# Patient Record
Sex: Male | Born: 1935
Health system: Southern US, Community
[De-identification: ages and names within clinical notes are randomized; demographics above are authoritative.]

## PROBLEM LIST (undated history)

## (undated) DIAGNOSIS — I2699 Other pulmonary embolism without acute cor pulmonale: Secondary | ICD-10-CM

## (undated) DIAGNOSIS — I509 Heart failure, unspecified: Secondary | ICD-10-CM

## (undated) DIAGNOSIS — I809 Phlebitis and thrombophlebitis of unspecified site: Secondary | ICD-10-CM

## (undated) DIAGNOSIS — R066 Hiccough: Secondary | ICD-10-CM

## (undated) DIAGNOSIS — K219 Gastro-esophageal reflux disease without esophagitis: Secondary | ICD-10-CM

## (undated) DIAGNOSIS — L739 Follicular disorder, unspecified: Secondary | ICD-10-CM

## (undated) DIAGNOSIS — I272 Pulmonary hypertension, unspecified: Secondary | ICD-10-CM

## (undated) DIAGNOSIS — G4730 Sleep apnea, unspecified: Secondary | ICD-10-CM

## (undated) DIAGNOSIS — I10 Essential (primary) hypertension: Secondary | ICD-10-CM

## (undated) DIAGNOSIS — I959 Hypotension, unspecified: Secondary | ICD-10-CM

## (undated) DIAGNOSIS — M1990 Unspecified osteoarthritis, unspecified site: Secondary | ICD-10-CM

## (undated) DIAGNOSIS — J449 Chronic obstructive pulmonary disease, unspecified: Secondary | ICD-10-CM

## (undated) DIAGNOSIS — N400 Enlarged prostate without lower urinary tract symptoms: Secondary | ICD-10-CM

## (undated) DIAGNOSIS — M109 Gout, unspecified: Secondary | ICD-10-CM

## (undated) DIAGNOSIS — J189 Pneumonia, unspecified organism: Secondary | ICD-10-CM

## (undated) HISTORY — PX: CHOLECYSTECTOMY: SHX55

## (undated) HISTORY — PX: OTHER SURGICAL HISTORY: SHX169

## (undated) HISTORY — DX: Heart failure, unspecified: I50.9

---

## 2001-01-08 ENCOUNTER — Ambulatory Visit (HOSPITAL_COMMUNITY): Admission: RE | Admit: 2001-01-08 | Discharge: 2001-01-08 | Payer: Self-pay | Admitting: *Deleted

## 2001-01-08 ENCOUNTER — Encounter (INDEPENDENT_AMBULATORY_CARE_PROVIDER_SITE_OTHER): Payer: Self-pay | Admitting: Specialist

## 2001-01-12 ENCOUNTER — Encounter: Payer: Self-pay | Admitting: Internal Medicine

## 2001-01-12 ENCOUNTER — Ambulatory Visit (HOSPITAL_COMMUNITY): Admission: RE | Admit: 2001-01-12 | Discharge: 2001-01-12 | Payer: Self-pay | Admitting: Internal Medicine

## 2003-04-09 ENCOUNTER — Encounter: Payer: Self-pay | Admitting: Internal Medicine

## 2003-04-09 ENCOUNTER — Encounter: Admission: RE | Admit: 2003-04-09 | Discharge: 2003-04-09 | Payer: Self-pay | Admitting: Internal Medicine

## 2005-08-16 ENCOUNTER — Emergency Department (HOSPITAL_COMMUNITY): Admission: EM | Admit: 2005-08-16 | Discharge: 2005-08-17 | Payer: Self-pay | Admitting: Emergency Medicine

## 2005-12-15 ENCOUNTER — Emergency Department (HOSPITAL_COMMUNITY): Admission: EM | Admit: 2005-12-15 | Discharge: 2005-12-15 | Payer: Self-pay | Admitting: Emergency Medicine

## 2005-12-17 ENCOUNTER — Ambulatory Visit (HOSPITAL_COMMUNITY): Admission: RE | Admit: 2005-12-17 | Discharge: 2005-12-17 | Payer: Self-pay | Admitting: Emergency Medicine

## 2005-12-18 ENCOUNTER — Encounter: Admission: RE | Admit: 2005-12-18 | Discharge: 2005-12-18 | Payer: Self-pay | Admitting: Surgery

## 2005-12-19 ENCOUNTER — Inpatient Hospital Stay (HOSPITAL_COMMUNITY): Admission: EM | Admit: 2005-12-19 | Discharge: 2005-12-24 | Payer: Self-pay | Admitting: Emergency Medicine

## 2005-12-20 ENCOUNTER — Encounter (INDEPENDENT_AMBULATORY_CARE_PROVIDER_SITE_OTHER): Payer: Self-pay | Admitting: Specialist

## 2005-12-21 ENCOUNTER — Ambulatory Visit: Payer: Self-pay | Admitting: Infectious Diseases

## 2005-12-27 ENCOUNTER — Emergency Department (HOSPITAL_COMMUNITY): Admission: EM | Admit: 2005-12-27 | Discharge: 2005-12-27 | Payer: Self-pay | Admitting: Emergency Medicine

## 2006-05-28 ENCOUNTER — Encounter (INDEPENDENT_AMBULATORY_CARE_PROVIDER_SITE_OTHER): Payer: Self-pay | Admitting: *Deleted

## 2006-05-29 ENCOUNTER — Inpatient Hospital Stay (HOSPITAL_COMMUNITY): Admission: RE | Admit: 2006-05-29 | Discharge: 2006-05-30 | Payer: Self-pay | Admitting: Surgery

## 2006-06-04 ENCOUNTER — Ambulatory Visit: Payer: Self-pay | Admitting: Gastroenterology

## 2007-05-15 ENCOUNTER — Encounter: Admission: RE | Admit: 2007-05-15 | Discharge: 2007-05-15 | Payer: Self-pay | Admitting: Internal Medicine

## 2007-06-10 ENCOUNTER — Ambulatory Visit (HOSPITAL_COMMUNITY): Admission: RE | Admit: 2007-06-10 | Discharge: 2007-06-10 | Payer: Self-pay | Admitting: *Deleted

## 2007-10-30 ENCOUNTER — Encounter: Admission: RE | Admit: 2007-10-30 | Discharge: 2007-10-30 | Payer: Self-pay | Admitting: Internal Medicine

## 2008-11-24 ENCOUNTER — Emergency Department (HOSPITAL_COMMUNITY): Admission: EM | Admit: 2008-11-24 | Discharge: 2008-11-24 | Payer: Self-pay | Admitting: Emergency Medicine

## 2010-09-09 ENCOUNTER — Encounter: Payer: Self-pay | Admitting: Internal Medicine

## 2010-11-28 LAB — GLUCOSE, CAPILLARY: Glucose-Capillary: 123 mg/dL — ABNORMAL HIGH (ref 70–99)

## 2011-01-01 NOTE — Op Note (Signed)
NAME:  Joshua [name], Joshua [name] NO.:  [id number]   MEDICAL RECORD NO.:  [id number]          PATIENT TYPE:  AMB   LOCATION:  ENDO                         FACILITY:  [facility]   PHYSICIAN:  Waverly Ferrari, M.D.    DATE OF BIRTH:  1935-11-07   DATE OF PROCEDURE:  06/10/2007  DATE OF DISCHARGE:                               OPERATIVE REPORT   PROCEDURE:  Colonoscopy and biopsy.   INDICATIONS:  Colon polyp and colon cancer screening.   ANESTHESIA:  Fentanyl 100 mcg, Versed 7 mg.   DESCRIPTION OF PROCEDURE:  With the patient mildly sedated in the left  lateral decubitus position, the Pentax videoscopic colonoscope was  inserted into the rectum after a rectal exam was performed which was  unremarkable.  It was passed under direct vision to the cecum identified  by the ileocecal valve and the appendiceal orifice, both which were  photographed. From this point, the colonoscope was slowly withdrawn  taking circumferential views of the colonic mucosa stopping to  photograph diverticula seen along the way, until we reached  approximately 20 cm from the anal verge at which point a polyp was seen,  photographed, and removed using hot biopsy forceps technique at a  setting of 20/150 blended current.  The rectum, otherwise, appeared  normal on direct and showed hemorrhoids on retroflexed view. The  endoscope was straightened and withdrawn.  The patient's vital signs and  pulse oximetry remained stable.  The patient tolerated the procedure  well without apparent complications.   FINDINGS:  Wide mouth diverticula seen in the right and left colon.  Internal hemorrhoids.  A polyp at 20 cm from the anal verge.  Await  biopsy report.  The patient will call me for results and follow up with  me as an outpatient.           ______________________________  Waverly Ferrari, M.D.     GMO/MEDQ  D:  06/10/2007  T:  06/10/2007  Job:  [id number]

## 2011-01-04 NOTE — Procedures (Signed)
[facility]  Patient:    Joshua [name], Joshua [name]                     MRN: [id number] Proc. Date: 01/08/01 Adm. Date:  [id number] Attending:  Jim Desanctis                           Procedure Report  PROCEDURE:  Colonoscopy.  INDICATION FOR PROCEDURE:  Rectal bleeding.  ANESTHESIA:  Demerol 10, Versed 2 mg.  DESCRIPTION OF PROCEDURE:  With the patient mildly sedated in the left lateral decubitus position, the Olympus videoscopic colonoscope was inserted in the rectum after a normal rectal exam and passed under direct vision to the cecum. identified by the ileocecal valve and appendiceal orifice both of which were photographed. From this point, the colonoscope was slowly withdrawn taking circumferential views of the entire colonic mucosa, stopping at 20 cm from the anal verge at which point a polyp was seen, photographed and removed using hot biopsy forceps technique on a setting of 20:20 blended current. The endoscope was then pulled back to the rectum which appeared normal on direct view and showed hemorrhoidal tissue and hypertrophy of the anal papilla on retroflexed view. The endoscope was then straightened, stomach decompressed and the endoscope pulled through the anal canal which showed hemorrhoids. The endoscope was withdrawn. The patients vital signs and pulse oximeter remained stable. The patient tolerated the procedure well without apparent complications.  FINDINGS:  Hemorrhoids and a polyp at 20 cm from the anal verge. Of note, the patient had significant right sided diverticulosis just above the cecum. DD:  01/08/01 TD:  01/08/01 Job: [id number]

## 2011-01-04 NOTE — Discharge Summary (Signed)
NAME:  KRISTOF, NADEEM              ACCOUNT NO.:  [id number]   MEDICAL RECORD NO.:  [id number]          PATIENT TYPE:  INP   LOCATION:  5730                         FACILITY:  [facility]   PHYSICIAN:  Mobolaji B. Bakare, M.D.DATE OF BIRTH:  May 25, 1936   DATE OF ADMISSION:  12/19/2005  DATE OF DISCHARGE:                                 DISCHARGE SUMMARY   PRIMARY CARE PHYSICIAN:  Theodoro Parma. Conley Canal, MD   DIAGNOSES:  1.  Liver abscess versus metastases.  2.  Cholestatic jaundice, improving.  3.  Thrombocytopenia, improved.  4.  Hypertension.  5.  Hyperkalemia.  6.  Tobacco abuse.   PROCEDURES:  1.  Ultrasound of the abdomen done on Dec 17, 2005, showed cholelithiasis      without evidence of acute cholecystitis and normal caliber of common      bile duct.  2.  CT scan of abdomen done on Dec 18, 2005, showed scattered, peripheral,      ill-defined areas of liver enhancement with central lobe attenuation      which were difficult to characterize, and an MRI was recommended, and      well-circumscribed, low-density lesion of the right hepatic lobe      consistent with cyst, cholelithiasis, left adrenal nodule, too small to      characterize, low-density lesions in the left kidneys.  3.  Pelvic CT scan showed prostate enlargement.  4.  MCRP showed cholelithiasis without biliary dilatation of      choledocholithiasis.  There were multiple enhancing liver lesions      compatible with metastasis or possible abscess.  No primary malignancy      identified on MRCP.  The left adrenal lesion that was noted on CT scan      appeared stable and benign.  5.  HIDA scan, hepatobiliary scan, done on Dec 20, 2005, was negative for      obstruction of the common bile duct.  6.  Chest x-ray showed bilateral basal atelectasis, mild diffuse      peribronchial pigment.  7.  Ultrasound-guided biopsy of liver lesion done on Dec 20, 2005, by Dr.      Vernard Gambles was sent for pathology and culture.    CONSULTATIONS:  1.  Surgical consultation, Imogene Burn. Georgette Dover, MD.  2.  Infectious disease consultation, Alison Murray, MD.   BRIEF HISTORY:  Mr. Wenzler is a pleasant 75 year old African American male  with history of hypertension.  He presented with approximately a 2-week  history of not feeling well, nausea, vomiting, and having hiccups.  He went  to [facility] Emergency Department where he was initially evaluated.  Ultrasound results were as noted above, and he followed up with Dr. Conley Canal  in the office who recommended further evaluation by Dr. Georgette Dover.  He had a CT  scan of the abdomen on Dec 18, 2005, and results are as mentioned above.  The  patient's symptoms continued, and he developed fever and leukocytosis with  elevated transaminases.  Hence, he was sent to [facility] for  hospitalization and further evaluation.   Problem 1.  Multiple liver  abscesses.  Mr. Schlabach presented with symptoms  and signs consistent with infection.  He had radiological imaging which  reported and favored metastatic disease over liver abscess.  On admission,  he was empirically started on antibiotics.  He denied any significant  chronic weight loss.  He has had colonoscopy 4 years ago which was benign.  Although he does smoke, CT abdomen and imaging did not show any focal  primary.  Chest x-ray was unremarkable.  The patient was screened with CEA,  alpha fetoprotein, and CA 19-9, all well within normal.  He underwent core  biopsy of the liver lesion.  Preliminary results of culture is growing gram-  negative rods.  Currently, fever is resolving.  Leukocytosis has improved  from 20,000 to 11.1.  Hiccups have subsided with baclofen.  Clinically, the  patient is responding to antibiotics, and the weight of evidence is in  support of an infection.  At this point, pathology report of core biopsy is  pending.  We will continue Zosyn until sensitivity pattern is available.  Cholestatic jaundice is improving.   The patient was evaluated by infectious  disease, Dr. Orene Desanctis.  He agreed with Zosyn.  The thought process is that the  patient probably had biliary infection with cholangitis and involvement of  intrahepatic biliary system.  He will eventually need to have cholecystitis.   Problem 2.  Thrombocytopenia.  On admission, platelets were noted to be 105.  This improved and normalized since antibiotics were started.  It was felt  that thrombocytopenia was secondary to infection.   Problem 3.  Hypokalemia.  This was replenished with potassium supplements.   Problem 4.  Hypertension.  This was controlled during the course of  hospitalization.  The patient stated he has a history of hypertension but is  not on any medications.  Blood pressure was normal during course of  hospitalization.   Problem 5.  Tobacco abuse.  The patient was counseled on quitting smoking,  and he was given a nicotine patch.   ADMISSION LABORATORY DATA:  Alpha fetoprotein 3.4, normal.  CEA less than  0.5, normal.  CA 19-9 0.3, low.  Blood culture no growth so far.  Urine  culture no growth.   ADDENDUM  An addendum to this dictation will be made at the time of discharge with  discharge medications and discharge laboratory data.      Mobolaji B. Maia Petties, M.D.  Electronically Signed     MBB/MEDQ  D:  12/22/2005  T:  12/22/2005  Job:  [id number]   cc:   Imogene Burn. Georgette Dover, M.D.  Marshall Marble. Francina Ames., M.D.  Fax: [phone]

## 2011-01-04 NOTE — Discharge Summary (Signed)
NAME:  DAXX, TIGGS NO.:  [id number]   MEDICAL RECORD NO.:  [id number]          PATIENT TYPE:  INP   LOCATION:  5730                         FACILITY:  [facility]   PHYSICIAN:  Jacquelynn Cree, M.D.   DATE OF BIRTH:  1936/04/14   DATE OF ADMISSION:  12/19/2005  DATE OF DISCHARGE:  12/24/2005                                 DISCHARGE SUMMARY   ADDENDUM:   PRIMARY CARE PHYSICIAN:  Theodoro Parma. Conley Canal, M.D.   GENERAL SURGEON:  Imogene Burn. Tsuei, M.D.   For complete list of the diagnoses, procedures and diagnostic studies,  consultations, brief history of present illness, and problem list with  hospital course, please see the previously dictated discharge summary done  by Dr. Maia Petties on Dec 19, 2005.   FINAL DIAGNOSES:  1.  Escherichia coli culture positive liver abscess status post biopsy.  2.  Cholestatic jaundice, improving.  3.  Thrombocytosis.  4.  Hypertension.  5.  Hypokalemia, resolved.  6.  Diarrhea.  7.  Gastroesophageal reflux disease.  8.  Tobacco abuse.  9.  Elevation of liver transaminases.   DISCHARGE MEDICATIONS:  1.  Protonix 40 mg p.o. daily.  2.  Doxazosin 2 mg daily.  3.  Rocephin 1 gram IV daily x3 weeks total (note, the patient is on day 6      out of 21).   Marvell:  The patient remained medically stable except  for complaints of diarrhea stools.  A stool was collected and sent for C-  difficile toxin analysis which is pending at the time of this dictation.  Additionally, the patient underwent placement of a peripherally inserted  central catheter for home IV therapy.  His diet was advanced which he  tolerated well.  The patient will continue to receive IV antibiotics at  home, to be  administered by the home health care nurses.  He is to follow up with Dr.  Georgette Dover for elective cholecystectomy after he completes his antibiotic  therapy.  He should follow up with his primary care physician, Dr. Conley Canal,  in one to  two weeks' time.  At this juncture, he is stable for discharge.           ______________________________  Jacquelynn Cree, M.D.     CR/MEDQ  D:  12/24/2005  T:  12/25/2005  Job:  [id number]   cc:   Theodoro Parma. Francina Ames., M.D.  Fax: [place]. Georgette Dover, M.D.  [address] [place]

## 2011-01-04 NOTE — Procedures (Signed)
[facility]  Patient:    [name], Joshua [name]                     MRN: [id number] Proc. Date: 01/08/01 Adm. Date:  [id number] Attending:  Jim Desanctis                           Procedure Report  PROCEDURE:  Upper endoscopy.  INDICATION FOR PROCEDURE:  Reflux symptomatology.  ANESTHESIA:  Demerol 50, Versed 5 mg.  DESCRIPTION OF PROCEDURE:  With the patient mildly sedated in the left lateral decubitus position, the Olympus videoscopic endoscope was inserted in the mouth and passed under direct vision through the esophagus which appeared normal into the stomach. The fundus, body, antrum, duodenal bulb, and second portion of the duodenal all appeared normal and were photographed. From this point, the endoscope was slowly withdrawn taking circumferential views of the entire duodenal mucosa until the endoscope was then pulled back into the stomach, placed in retroflexion to view the stomach from below and a hernia was seen and photographed. The endoscope was straightened and withdrawn taking circumferential views of the remaining gastric and esophageal mucosa which otherwise appeared normal.  The patients vital signs and pulse oximeter remained stable. The patient tolerated the procedure well and there were no apparent complications.  FINDINGS:  Changes of hiatal hernia otherwise unremarkable examination.  PLAN:  Proceed to colonoscopy. DD:  01/08/01 TD:  01/08/01 Job: [id number]

## 2011-01-04 NOTE — Consult Note (Signed)
NAME:  Joshua [name], Joshua [name] NO.:  [id number]   MEDICAL RECORD NO.:  [id number]          PATIENT TYPE:  INP   LOCATION:  5730                         FACILITY:  [place]   PHYSICIAN:  Imogene Burn. Georgette Dover, M.D. DATE OF BIRTH:  1936/02/02   DATE OF CONSULTATION:  DATE OF DISCHARGE:                                   CONSULTATION   PRIMARY CARE PHYSICIAN:  Dr. Harley Alto.   REASON FOR CONSULTATION:  Abnormal liver function tests and gallstones.   HISTORY OF PRESENT ILLNESS:  We are asked to reconsult on this patient.  I  saw him last this past Tuesday in our urgent office for gallstones.  At that  time, the patient was asymptomatic except for some mild nausea.  He had no  evidence of abdominal pain.  He was profoundly jaundiced and dehydrated.  I  spoke with Dr. Conley Canal who agreed to arrange further followup with his  gastroenterologist.  The patient underwent a CT scan which showed  gallstones, but no evidence of inflammation.  The patient continues to have  nausea, vomiting and has begun to have some right flank pain.  He continues  to be jaundiced.   The patient is a 75 year old male with a past medical history significant  for heavy alcohol use.  He also has a documented hiatal hernia.  Last  Saturday, April29th, the patient began having nausea, vomiting, and  diarrhea.  He was evaluated in the emergency department at [facility] by  Dr. Peter Minium where he underwent some blood work.  This showed a normal white  blood cell count, hemoglobin of 14.8, abnormal liver functions with a total  bilirubin of 6.4,  direct of 4.1, indirect 2.3, alkaline phosphatase 142,  AST 142, ALT 173.  Urinalysis confirmed large amounts of bilirubin.  The  patient is also profoundly hyponatremic with a sodium of 128 and potassium  of 2.8, creatinine 1.6.  The patient was discharged home and underwent as  outpatient ultrasound on Monday which showed cholelithiasis, but no evidence  cholecystitis.  He was then referred for surgical evaluation.  I do not feel  that his symptoms represented acute cholecystitis.  He had an obvious  biliary obstruction and needed a gastroenterologist to evaluate.  It is  unclear to me at this point whether the patient has been seen by his  gastroenterologist, Dr. Lajoyce Corners.   MEDICATIONS:  Doxazosin 2 mg p.o. daily, Protonix 40 mg p.o. daily, HCTZ 50  mg p.o. daily.   ALLERGIES:  SULFA.   PAST MEDICAL HISTORY:  Hiatal hernia, hypertension, alcohol abuse.   PAST SURGICAL HISTORY:  Excision of pilonidal cyst.   SOCIAL HISTORY:  The patient smokes 2 packs a day and drinks a fifth of  liquor a week.   FAMILY HISTORY:  Father is deceased from cancer of unclear etiology, and  mother is deceased from natural causes.   EXAMINATION:  VITAL SIGNS:  Height 64, weight 220, blood pressure is 110/83,  pulse is 90.  The patient is afebrile.  GENERAL:  This is a well-developed, well-nourished African-American male in  no apparent distress.  NEURO:  Awake, alert, oriented x3.  HEENT:  EOMI.  His sclerae show icterus.  NECK:  No masses.  No thyromegaly.  LUNGS:  Clear to auscultation bilaterally.  Normal respiratory effort.  HEART:  Regular rate and rhythm.  No murmurs.  ABDOMEN:  Positive bowel sounds, soft, nondistended.  Minimal tenderness in  the right upper quadrant around to the right flank.  SKIN:  Shows some mild jaundice.   LABS:  White count 7.8, hemoglobin 14.4, platelet count 105.  Electrolytes:  Sodium 131, potassium 3.5, BUN 17, creatinine 1.4.  Total bilirubin is 5.8,  AST 140, ALT 144, alkaline phos 144.   IMPRESSION:  1.  Jaundice with mild abdominal pain.  2.  Cholelithiasis.  3.  Alcohol abuse.  4.  The patient may have symptomatic gallstones, but he needs to have his      biliary tree thoroughly evaluated with possible preoperative ERCP.      Recommend a HIDA scan, as well as an MRCP.  The liver lesion seen on CT      scan  are of uncertain significance.  Some of these were also present on      his scan in 2002.   RECOMMENDATIONS:  HIDA scan, MRCP, GI evaluation.  Agree with antibiotics  and clear liquids.  Would not advance his diet past that.  We will continue  to follow, and the patient may require laparoscopic cholecystectomy  eventually.      Imogene Burn. Tsuei, M.D.  Electronically Signed     MKT/MEDQ  D:  12/19/2005  T:  12/20/2005  Job:  [id number]   cc:   Edythe Lynn, M.D.   Theodoro Parma. Francina Ames., M.D.  Fax: [phone]

## 2011-01-04 NOTE — Op Note (Signed)
NAME:  Joshua [name], Joshua [name] NO.:  [id number]   MEDICAL RECORD NO.:  [id number]          PATIENT TYPE:  INP   LOCATION:  1611                         FACILITY:  [facility]   PHYSICIAN:  Imogene Burn. Georgette Dover, M.D. DATE OF BIRTH:  1935/12/25   DATE OF PROCEDURE:  05/28/2006  DATE OF DISCHARGE:                                 OPERATIVE REPORT   PREOPERATIVE DIAGNOSIS:  Chronic calculus cholecystitis.   POSTOPERATIVE DIAGNOSIS:  Chronic calculus cholecystitis.   PROCEDURE PERFORMED:  Laparoscopic cholecystectomy with interoperative  cholangiogram.   SURGEON:  Imogene Burn. Tsuei, M.D.   ANESTHESIA:  General endotracheal.   INDICATIONS:  The patient is a 75 year old male who presented several months  ago with nausea, vomiting, and diarrhea.  He was evaluated in the emergency  department and noted to be jaundiced.  He was given a presumptive diagnosis  of hepatitis and was discharged home.  However, he was then scheduled for  outpatient ultrasound which showed cholelithiasis but no evidence  cholecystitis.  The patient continued have symptoms and was admitted to the  hospital.  He was thoroughly worked up by his primary care physician, Dr.  Harley Alto as well as Dr. Lajoyce Corners.  He was ruled out for any hepatic  malignancy or cirrhosis.  Overall, he is doing quite well.  His jaundice has  resolved.  He continues to have symptomatic intermittent right upper  quadrant discomfort.  This mostly is represented by bloating and gas.  He  now presents for elective cholecystectomy.  His preoperative liver function  tests were normal.   DESCRIPTION OF PROCEDURE:  The patient was brought to the operating room and  placed in the supine position on the operating table.  After an adequate  level of general anesthesia was obtained, the patient's abdomen was prepped  with Betadine and draped in sterile fashion.  A time out was taken to assure  the proper patient and proper procedure.  A transverse  incision was made  just below his umbilicus after infiltrating with 0.25% Marcaine.  Dissection  was carried down to the fascia which was opened vertically.  The peritoneal  cavity was bluntly entered.  A stay suture of 0 Vicryl was placed around the  fascial opening.  The Hasson cannula was inserted and secured with a stay  suture.  Pneumoperitoneum was obtained by insufflating CO2 maintaining  maximal pressure of 15 mmHg.  The laparoscope was inserted and the patient  was positioned in reversed Trendelenburg position and rotated slightly to  his left.   The liver appeared grossly normal.  A 10 mm port was inserted in the  subxiphoid position.  Two 5 mm ports were placed in the right upper  quadrant.  The gallbladder was grasped with a clamp and elevated over the  edge of the liver.  The peritoneum around the hilum of the gallbladder was  opened.  The cystic duct was circumferentially dissected, ligated and  clipped distally.  A small opening was created on the cystic duct.  A Cook  cholangiogram catheter was then inserted through a stab incision and  threaded into  the cystic duct.  This was secured with a clip.  A  cholangiogram was obtained.  This could showed good flow proximally and  distally in the biliary tree.  There was flow seen into the duodenum.  However, there was a large filling defect noted at the distal common bile  duct.  This was not obstructive.  We waited several minutes then repeated  the cholangiogram.  This filling defect remained in place.  The decision was  made to proceed with cholecystectomy and consult GI for a postoperative  ERCP.  The cholangiogram catheter was removed and the cystic duct was  ligated with clips and divided.  Two branches of the cystic artery were also  ligated with clips and divided.  Cautery was then used to remove the  gallbladder from the liver bed.  The gallbladder fossa was inspected for  hemostasis and this was obtained with cautery.   We thoroughly irrigated the  right upper quadrant.  No bleeding was noted.  The gallbladder was detached  and placed in an EndoCatch sac.  This was removed through the umbilical port  site.  We reinspected the right upper quadrant and found good hemostasis.  The pneumoperitoneum was then released as ports were removed.  The stay  suture was used to close the umbilical fascia.  A 4-0 Monocryl was used to  close the skin.  Steri-Strips and clean dressings were applied.  The patient  was then extubated and brought to recovery in stable condition.  All sponge,  instrument, and needle counts were correct.      Imogene Burn. Tsuei, M.D.  Electronically Signed     MKT/MEDQ  D:  05/28/2006  T:  05/29/2006  Job:  [id number]   cc:   Theodoro Parma. Francina Ames., M.D.  Fax: [phone]   Waverly Ferrari, M.D.  Fax: [phone]

## 2011-01-04 NOTE — H&P (Signed)
NAME:  Joshua [name], Joshua [name] NO.:  [id number]   MEDICAL RECORD NO.:  [id number]          PATIENT TYPE:  INP   LOCATION:  1827                         FACILITY:  [facility]   PHYSICIAN:  Edythe Lynn, M.D.       DATE OF BIRTH:  1936-08-12   DATE OF ADMISSION:  12/19/2005  DATE OF DISCHARGE:                                HISTORY & PHYSICAL   PRIMARY CARE PHYSICIAN:  Dr. Harley Alto   CHIEF COMPLAINT:  Nausea, vomiting.   HISTORY OF PRESENT ILLNESS:  Mr. Gottschall is a 75 year old African-American  man with history of hypertension who reports that for about a week he has  been having some significant nausea and vomiting to a point where he started  having some hiccups.  He presented initially to [facility]  on Sunday, April29 and he was extensively evaluated and found to have  abnormal liver function tests, but normal ultrasound study so he was sent  home.  He saw his primary care physician on May1, 2007 and he had a computed  tomography scan of his abdomen obtained.   The computed tomography scan of the abdomen did show extensive liver lesions  of unknown significance.  The patient was in the process of getting worked  up by his primary care physician when he suddenly developed increased  abdominal pain radiating to the back together with fever and chills the  night prior to admission.  He called his primary care physician today and he  was referred to the emergency room.   PAST MEDICAL HISTORY:  1.  Gastroesophageal reflux disease.  2.  Hiatal hernia.  3.  Some extensive diverticulosis.  4.  No prior abdominal surgeries.   SOCIAL HISTORY:  Patient is married, lives with his wife.  He has a grown-up  child.  He works at [employer].  He smokes two packs of cigarettes  every day.  He also drinks occasionally alcohol.   FAMILY HISTORY:  Noncontributory.   REVIEW OF SYSTEMS:  Negative for chest pain, negative shortness of breath.  Negative for  headache.  Negative for dysuria.  Positive for decreased  urinary output.   PHYSICAL EXAMINATION:  GENERAL APPEARANCE:  He is in no acute distress,  alert, oriented.  He appears well-developed, well-nourished.  VITAL SIGNS:  Temperature is 99.9, heart rate 108, blood pressure 116/79,  respirations 18.  HEENT:  Normocephalic, atraumatic.  Eyes have pupils equal, round, reactive  to light, accommodation.  Patient's sclerae have icterus.  His conjunctivae  are pink.  Patient's throat is clear.  Mouth is without ulceration.  NECK:  Supple without JVD.  CHEST:  Clear to auscultation bilaterally without wheezes, rhonchi, or  crackles.  HEART:  Regular rate and rhythm without murmurs, rubs, or gallops.  ABDOMEN:  Obese, soft.  There is some tenderness in the right flank as well  as right upper quadrant but the Murphy's sign is negative.  Patient does not  have any rebound tenderness and/or any guarding.  EXTREMITIES:  No edema.  SKIN:  Warm and dry.  There are no suspicious  rashes.  NEUROLOGIC:  Cranial nerves III-XII are intact.  Strength is 5/5 in all four  extremities bilaterally.  Sensation appears to be intact.   Laboratory values are pending at the time of my dictation but I have the  results from April29,2007 which show a white blood cell count 9.7,  hemoglobin of 14.8, a sodium level 128, potassium 2.8, BUN of 18, and  creatinine 1.6.  The total bilirubin is elevated at 6.4 with AST 142, ALT  173.  I have the results available of an abdominal ultrasound which was  performed on Dec 17, 2005 showing cholelithiasis without evidence of acute  cholecystitis and a normal caliber common bile duct.  Also, results of a  computed tomography scan of the abdomen are available from Dec 18, 2005 that  shows ill-defined areas of liver enhancement within the liver,  cholelithiasis, a left renal nodule   ASSESSMENT AND PLAN:  1.  Acute intra-abdominal process of unclear etiology.  Differential       diagnosis includes diverticulitis versus cholecystitis versus hepatic      abscesses versus a different process.  At this point in time my plan is      to obtain an MRCP rule out a common bile duct stone, start the patient      on intravenous Unasyn, and recheck his laboratory work including a CMET,      CBC, PT/PTT, INR as well as a urine culture and sensitivity.  2.  Acute renal insufficiency and dehydration.  This is secondary to      patient's nausea, vomiting, and diuretic use.  Patient will be started      on intravenous fluids.  His potassium will be repleted and he will be      tried on anti-emetics together with a clear liquid diet.  3.  Mild thrombocytopenia of unclear cause.  Differential includes a      possibility of a gram-negative rod infection versus the alcohol effects      versus another cause.  My plan is to follow up closely his platelet      count and obtain a blood smear to rule out the presence of schistocytes      on the smear.  At this point in time I do not suspect TTP given the fact      the patient has no neurological symptoms.  He does not have any      significant renal impairment and he does not have any anemia.  If the      thrombocytopenia worsens and the condition of the patient worsens we      will obtain a hematology consultation.      Edythe Lynn, M.D.  Electronically Signed     SL/MEDQ  D:  12/19/2005  T:  12/19/2005  Job:  [id number]   cc:   Theodoro Parma. Francina Ames., M.D.  Fax: [phone]

## 2011-01-04 NOTE — Discharge Summary (Signed)
NAME:  Joshua [name], Joshua [name] NO.:  [id number]   MEDICAL RECORD NO.:  [id number]          PATIENT TYPE:  INP   LOCATION:  1611                         FACILITY:  [facility]   PHYSICIAN:  Imogene Burn. Georgette Dover, M.D. DATE OF BIRTH:  05/22/1936   DATE OF ADMISSION:  05/28/2006  DATE OF DISCHARGE:  05/30/2006                                 DISCHARGE SUMMARY   ADMISSION DIAGNOSIS:  Chronic calculous cholecystitis and  choledocholithiasis   The patient is a 75 year old male who was diagnosed with chronic calculous  cholecystitis.  He presented for elective cholecystectomy.  He underwent a  laparoscopic cholecystectomy on May 28, 2006.  However, at the time of  the cholangiogram he was noted to have a common bile duct stone.  Gastroenterology was then consulted.  Dr. Deatra Ina performed an ERCP on  May 29, 2006.  This was successful in removing 10 mm common bile duct  stone.  The patient did well postoperatively and was discharged home on  May 30, 2006.   DISCHARGE INSTRUCTIONS:  Percocet p.r.n.  The patient may shower beginning  tomorrow.  Follow up in 2-3 weeks with Dr. Georgette Dover.   FINAL DIAGNOSIS:  Chronic calculous cholecystitis and choledocholithiasis      Imogene Burn. Tsuei, M.D.  Electronically Signed     MKT/MEDQ  D:  06/17/2006  T:  06/17/2006  Job:  [id number]

## 2011-08-22 DIAGNOSIS — M1710 Unilateral primary osteoarthritis, unspecified knee: Secondary | ICD-10-CM | POA: Diagnosis not present

## 2011-08-22 DIAGNOSIS — IMO0002 Reserved for concepts with insufficient information to code with codable children: Secondary | ICD-10-CM | POA: Diagnosis not present

## 2011-08-28 DIAGNOSIS — H2510 Age-related nuclear cataract, unspecified eye: Secondary | ICD-10-CM | POA: Diagnosis not present

## 2011-08-29 DIAGNOSIS — N401 Enlarged prostate with lower urinary tract symptoms: Secondary | ICD-10-CM | POA: Diagnosis not present

## 2011-08-29 DIAGNOSIS — N400 Enlarged prostate without lower urinary tract symptoms: Secondary | ICD-10-CM | POA: Diagnosis not present

## 2011-08-30 DIAGNOSIS — M1710 Unilateral primary osteoarthritis, unspecified knee: Secondary | ICD-10-CM | POA: Diagnosis not present

## 2011-08-30 DIAGNOSIS — IMO0002 Reserved for concepts with insufficient information to code with codable children: Secondary | ICD-10-CM | POA: Diagnosis not present

## 2011-09-04 DIAGNOSIS — H2510 Age-related nuclear cataract, unspecified eye: Secondary | ICD-10-CM | POA: Diagnosis not present

## 2011-09-05 DIAGNOSIS — H2510 Age-related nuclear cataract, unspecified eye: Secondary | ICD-10-CM | POA: Diagnosis not present

## 2011-09-11 DIAGNOSIS — H2510 Age-related nuclear cataract, unspecified eye: Secondary | ICD-10-CM | POA: Diagnosis not present

## 2011-09-11 DIAGNOSIS — H269 Unspecified cataract: Secondary | ICD-10-CM | POA: Diagnosis not present

## 2011-10-16 DIAGNOSIS — R972 Elevated prostate specific antigen [PSA]: Secondary | ICD-10-CM | POA: Diagnosis not present

## 2011-10-16 DIAGNOSIS — N401 Enlarged prostate with lower urinary tract symptoms: Secondary | ICD-10-CM | POA: Diagnosis not present

## 2011-11-28 DIAGNOSIS — E7800 Pure hypercholesterolemia, unspecified: Secondary | ICD-10-CM | POA: Diagnosis not present

## 2011-11-28 DIAGNOSIS — I10 Essential (primary) hypertension: Secondary | ICD-10-CM | POA: Diagnosis not present

## 2011-11-28 DIAGNOSIS — R7309 Other abnormal glucose: Secondary | ICD-10-CM | POA: Diagnosis not present

## 2011-12-04 DIAGNOSIS — I10 Essential (primary) hypertension: Secondary | ICD-10-CM | POA: Diagnosis not present

## 2011-12-04 DIAGNOSIS — E119 Type 2 diabetes mellitus without complications: Secondary | ICD-10-CM | POA: Diagnosis not present

## 2011-12-04 DIAGNOSIS — E7800 Pure hypercholesterolemia, unspecified: Secondary | ICD-10-CM | POA: Diagnosis not present

## 2012-01-06 DIAGNOSIS — Z961 Presence of intraocular lens: Secondary | ICD-10-CM | POA: Diagnosis not present

## 2012-02-13 DIAGNOSIS — M1710 Unilateral primary osteoarthritis, unspecified knee: Secondary | ICD-10-CM | POA: Diagnosis not present

## 2012-02-13 DIAGNOSIS — IMO0002 Reserved for concepts with insufficient information to code with codable children: Secondary | ICD-10-CM | POA: Diagnosis not present

## 2012-03-25 DIAGNOSIS — IMO0002 Reserved for concepts with insufficient information to code with codable children: Secondary | ICD-10-CM | POA: Diagnosis not present

## 2012-03-25 DIAGNOSIS — M1710 Unilateral primary osteoarthritis, unspecified knee: Secondary | ICD-10-CM | POA: Diagnosis not present

## 2012-03-31 DIAGNOSIS — Z1211 Encounter for screening for malignant neoplasm of colon: Secondary | ICD-10-CM | POA: Diagnosis not present

## 2012-03-31 DIAGNOSIS — Z86010 Personal history of colonic polyps: Secondary | ICD-10-CM | POA: Diagnosis not present

## 2012-03-31 DIAGNOSIS — K649 Unspecified hemorrhoids: Secondary | ICD-10-CM | POA: Diagnosis not present

## 2012-03-31 DIAGNOSIS — K5730 Diverticulosis of large intestine without perforation or abscess without bleeding: Secondary | ICD-10-CM | POA: Diagnosis not present

## 2012-04-01 DIAGNOSIS — M1710 Unilateral primary osteoarthritis, unspecified knee: Secondary | ICD-10-CM | POA: Diagnosis not present

## 2012-04-01 DIAGNOSIS — M25569 Pain in unspecified knee: Secondary | ICD-10-CM | POA: Diagnosis not present

## 2012-04-01 DIAGNOSIS — IMO0002 Reserved for concepts with insufficient information to code with codable children: Secondary | ICD-10-CM | POA: Diagnosis not present

## 2012-04-08 DIAGNOSIS — M1710 Unilateral primary osteoarthritis, unspecified knee: Secondary | ICD-10-CM | POA: Diagnosis not present

## 2012-04-08 DIAGNOSIS — R972 Elevated prostate specific antigen [PSA]: Secondary | ICD-10-CM | POA: Diagnosis not present

## 2012-04-08 DIAGNOSIS — IMO0002 Reserved for concepts with insufficient information to code with codable children: Secondary | ICD-10-CM | POA: Diagnosis not present

## 2012-04-15 DIAGNOSIS — M1710 Unilateral primary osteoarthritis, unspecified knee: Secondary | ICD-10-CM | POA: Diagnosis not present

## 2012-04-15 DIAGNOSIS — IMO0002 Reserved for concepts with insufficient information to code with codable children: Secondary | ICD-10-CM | POA: Diagnosis not present

## 2012-04-22 DIAGNOSIS — M1710 Unilateral primary osteoarthritis, unspecified knee: Secondary | ICD-10-CM | POA: Diagnosis not present

## 2012-04-22 DIAGNOSIS — IMO0002 Reserved for concepts with insufficient information to code with codable children: Secondary | ICD-10-CM | POA: Diagnosis not present

## 2012-05-13 DIAGNOSIS — R972 Elevated prostate specific antigen [PSA]: Secondary | ICD-10-CM | POA: Diagnosis not present

## 2012-05-13 DIAGNOSIS — N401 Enlarged prostate with lower urinary tract symptoms: Secondary | ICD-10-CM | POA: Diagnosis not present

## 2012-05-19 DIAGNOSIS — I10 Essential (primary) hypertension: Secondary | ICD-10-CM | POA: Diagnosis not present

## 2012-05-19 DIAGNOSIS — R339 Retention of urine, unspecified: Secondary | ICD-10-CM | POA: Diagnosis not present

## 2012-05-22 DIAGNOSIS — N401 Enlarged prostate with lower urinary tract symptoms: Secondary | ICD-10-CM | POA: Diagnosis not present

## 2012-05-22 DIAGNOSIS — N138 Other obstructive and reflux uropathy: Secondary | ICD-10-CM | POA: Diagnosis not present

## 2012-06-04 DIAGNOSIS — N401 Enlarged prostate with lower urinary tract symptoms: Secondary | ICD-10-CM | POA: Diagnosis not present

## 2012-06-04 DIAGNOSIS — R339 Retention of urine, unspecified: Secondary | ICD-10-CM | POA: Diagnosis not present

## 2012-06-05 DIAGNOSIS — E119 Type 2 diabetes mellitus without complications: Secondary | ICD-10-CM | POA: Diagnosis not present

## 2012-06-05 DIAGNOSIS — I10 Essential (primary) hypertension: Secondary | ICD-10-CM | POA: Diagnosis not present

## 2012-06-11 DIAGNOSIS — Z23 Encounter for immunization: Secondary | ICD-10-CM | POA: Diagnosis not present

## 2012-06-11 DIAGNOSIS — R7309 Other abnormal glucose: Secondary | ICD-10-CM | POA: Diagnosis not present

## 2012-06-11 DIAGNOSIS — I10 Essential (primary) hypertension: Secondary | ICD-10-CM | POA: Diagnosis not present

## 2012-06-11 DIAGNOSIS — N400 Enlarged prostate without lower urinary tract symptoms: Secondary | ICD-10-CM | POA: Diagnosis not present

## 2012-06-11 DIAGNOSIS — R972 Elevated prostate specific antigen [PSA]: Secondary | ICD-10-CM | POA: Diagnosis not present

## 2012-08-31 DIAGNOSIS — N401 Enlarged prostate with lower urinary tract symptoms: Secondary | ICD-10-CM | POA: Diagnosis not present

## 2012-08-31 DIAGNOSIS — R972 Elevated prostate specific antigen [PSA]: Secondary | ICD-10-CM | POA: Diagnosis not present

## 2012-09-17 DIAGNOSIS — Z961 Presence of intraocular lens: Secondary | ICD-10-CM | POA: Diagnosis not present

## 2012-10-20 DIAGNOSIS — M79609 Pain in unspecified limb: Secondary | ICD-10-CM | POA: Diagnosis not present

## 2012-10-20 DIAGNOSIS — M109 Gout, unspecified: Secondary | ICD-10-CM | POA: Diagnosis not present

## 2012-10-22 DIAGNOSIS — H43819 Vitreous degeneration, unspecified eye: Secondary | ICD-10-CM | POA: Diagnosis not present

## 2012-12-03 DIAGNOSIS — R7309 Other abnormal glucose: Secondary | ICD-10-CM | POA: Diagnosis not present

## 2012-12-03 DIAGNOSIS — M109 Gout, unspecified: Secondary | ICD-10-CM | POA: Diagnosis not present

## 2012-12-03 DIAGNOSIS — I10 Essential (primary) hypertension: Secondary | ICD-10-CM | POA: Diagnosis not present

## 2012-12-10 DIAGNOSIS — E7800 Pure hypercholesterolemia, unspecified: Secondary | ICD-10-CM | POA: Diagnosis not present

## 2012-12-10 DIAGNOSIS — M25519 Pain in unspecified shoulder: Secondary | ICD-10-CM | POA: Diagnosis not present

## 2012-12-10 DIAGNOSIS — I10 Essential (primary) hypertension: Secondary | ICD-10-CM | POA: Diagnosis not present

## 2012-12-10 DIAGNOSIS — M109 Gout, unspecified: Secondary | ICD-10-CM | POA: Diagnosis not present

## 2013-01-04 DIAGNOSIS — M67919 Unspecified disorder of synovium and tendon, unspecified shoulder: Secondary | ICD-10-CM | POA: Diagnosis not present

## 2013-01-04 DIAGNOSIS — M1710 Unilateral primary osteoarthritis, unspecified knee: Secondary | ICD-10-CM | POA: Diagnosis not present

## 2013-01-04 DIAGNOSIS — M25519 Pain in unspecified shoulder: Secondary | ICD-10-CM | POA: Diagnosis not present

## 2013-01-04 DIAGNOSIS — M719 Bursopathy, unspecified: Secondary | ICD-10-CM | POA: Diagnosis not present

## 2013-01-13 DIAGNOSIS — M1710 Unilateral primary osteoarthritis, unspecified knee: Secondary | ICD-10-CM | POA: Diagnosis not present

## 2013-01-13 DIAGNOSIS — IMO0002 Reserved for concepts with insufficient information to code with codable children: Secondary | ICD-10-CM | POA: Diagnosis not present

## 2013-01-20 DIAGNOSIS — IMO0002 Reserved for concepts with insufficient information to code with codable children: Secondary | ICD-10-CM | POA: Diagnosis not present

## 2013-01-20 DIAGNOSIS — M1710 Unilateral primary osteoarthritis, unspecified knee: Secondary | ICD-10-CM | POA: Diagnosis not present

## 2013-01-27 DIAGNOSIS — M1710 Unilateral primary osteoarthritis, unspecified knee: Secondary | ICD-10-CM | POA: Diagnosis not present

## 2013-01-27 DIAGNOSIS — IMO0002 Reserved for concepts with insufficient information to code with codable children: Secondary | ICD-10-CM | POA: Diagnosis not present

## 2013-02-03 DIAGNOSIS — M1710 Unilateral primary osteoarthritis, unspecified knee: Secondary | ICD-10-CM | POA: Diagnosis not present

## 2013-02-03 DIAGNOSIS — IMO0002 Reserved for concepts with insufficient information to code with codable children: Secondary | ICD-10-CM | POA: Diagnosis not present

## 2013-04-01 DIAGNOSIS — IMO0002 Reserved for concepts with insufficient information to code with codable children: Secondary | ICD-10-CM | POA: Diagnosis not present

## 2013-04-01 DIAGNOSIS — M1710 Unilateral primary osteoarthritis, unspecified knee: Secondary | ICD-10-CM | POA: Diagnosis not present

## 2013-04-08 DIAGNOSIS — N401 Enlarged prostate with lower urinary tract symptoms: Secondary | ICD-10-CM | POA: Diagnosis not present

## 2013-04-08 DIAGNOSIS — R972 Elevated prostate specific antigen [PSA]: Secondary | ICD-10-CM | POA: Diagnosis not present

## 2013-06-10 DIAGNOSIS — R7309 Other abnormal glucose: Secondary | ICD-10-CM | POA: Diagnosis not present

## 2013-06-10 DIAGNOSIS — I10 Essential (primary) hypertension: Secondary | ICD-10-CM | POA: Diagnosis not present

## 2013-06-10 DIAGNOSIS — M109 Gout, unspecified: Secondary | ICD-10-CM | POA: Diagnosis not present

## 2013-06-16 DIAGNOSIS — R7309 Other abnormal glucose: Secondary | ICD-10-CM | POA: Diagnosis not present

## 2013-06-16 DIAGNOSIS — E7800 Pure hypercholesterolemia, unspecified: Secondary | ICD-10-CM | POA: Diagnosis not present

## 2013-06-16 DIAGNOSIS — I10 Essential (primary) hypertension: Secondary | ICD-10-CM | POA: Diagnosis not present

## 2013-06-16 DIAGNOSIS — Z23 Encounter for immunization: Secondary | ICD-10-CM | POA: Diagnosis not present

## 2013-06-16 DIAGNOSIS — M109 Gout, unspecified: Secondary | ICD-10-CM | POA: Diagnosis not present

## 2013-07-19 DIAGNOSIS — M25569 Pain in unspecified knee: Secondary | ICD-10-CM | POA: Diagnosis not present

## 2013-09-10 DIAGNOSIS — R7309 Other abnormal glucose: Secondary | ICD-10-CM | POA: Diagnosis not present

## 2013-09-16 DIAGNOSIS — M12869 Other specific arthropathies, not elsewhere classified, unspecified knee: Secondary | ICD-10-CM | POA: Diagnosis not present

## 2013-09-16 DIAGNOSIS — IMO0002 Reserved for concepts with insufficient information to code with codable children: Secondary | ICD-10-CM | POA: Diagnosis not present

## 2013-09-16 DIAGNOSIS — M1710 Unilateral primary osteoarthritis, unspecified knee: Secondary | ICD-10-CM | POA: Diagnosis not present

## 2013-09-17 DIAGNOSIS — E119 Type 2 diabetes mellitus without complications: Secondary | ICD-10-CM | POA: Diagnosis not present

## 2013-09-17 DIAGNOSIS — E7800 Pure hypercholesterolemia, unspecified: Secondary | ICD-10-CM | POA: Diagnosis not present

## 2013-09-17 DIAGNOSIS — I10 Essential (primary) hypertension: Secondary | ICD-10-CM | POA: Diagnosis not present

## 2013-09-17 DIAGNOSIS — M109 Gout, unspecified: Secondary | ICD-10-CM | POA: Diagnosis not present

## 2013-09-22 DIAGNOSIS — M1710 Unilateral primary osteoarthritis, unspecified knee: Secondary | ICD-10-CM | POA: Diagnosis not present

## 2013-09-30 DIAGNOSIS — M1710 Unilateral primary osteoarthritis, unspecified knee: Secondary | ICD-10-CM | POA: Diagnosis not present

## 2013-10-06 DIAGNOSIS — R972 Elevated prostate specific antigen [PSA]: Secondary | ICD-10-CM | POA: Diagnosis not present

## 2013-10-08 DIAGNOSIS — IMO0002 Reserved for concepts with insufficient information to code with codable children: Secondary | ICD-10-CM | POA: Diagnosis not present

## 2013-10-08 DIAGNOSIS — M1710 Unilateral primary osteoarthritis, unspecified knee: Secondary | ICD-10-CM | POA: Diagnosis not present

## 2013-10-15 DIAGNOSIS — IMO0002 Reserved for concepts with insufficient information to code with codable children: Secondary | ICD-10-CM | POA: Diagnosis not present

## 2013-10-15 DIAGNOSIS — M1710 Unilateral primary osteoarthritis, unspecified knee: Secondary | ICD-10-CM | POA: Diagnosis not present

## 2013-11-04 DIAGNOSIS — R972 Elevated prostate specific antigen [PSA]: Secondary | ICD-10-CM | POA: Diagnosis not present

## 2013-11-04 DIAGNOSIS — N400 Enlarged prostate without lower urinary tract symptoms: Secondary | ICD-10-CM | POA: Diagnosis not present

## 2013-12-09 DIAGNOSIS — M109 Gout, unspecified: Secondary | ICD-10-CM | POA: Diagnosis not present

## 2013-12-09 DIAGNOSIS — E119 Type 2 diabetes mellitus without complications: Secondary | ICD-10-CM | POA: Diagnosis not present

## 2013-12-09 DIAGNOSIS — I10 Essential (primary) hypertension: Secondary | ICD-10-CM | POA: Diagnosis not present

## 2013-12-16 DIAGNOSIS — I10 Essential (primary) hypertension: Secondary | ICD-10-CM | POA: Diagnosis not present

## 2013-12-16 DIAGNOSIS — M25529 Pain in unspecified elbow: Secondary | ICD-10-CM | POA: Diagnosis not present

## 2013-12-16 DIAGNOSIS — E7800 Pure hypercholesterolemia, unspecified: Secondary | ICD-10-CM | POA: Diagnosis not present

## 2013-12-16 DIAGNOSIS — M109 Gout, unspecified: Secondary | ICD-10-CM | POA: Diagnosis not present

## 2013-12-22 DIAGNOSIS — M7710 Lateral epicondylitis, unspecified elbow: Secondary | ICD-10-CM | POA: Diagnosis not present

## 2013-12-22 DIAGNOSIS — M25529 Pain in unspecified elbow: Secondary | ICD-10-CM | POA: Diagnosis not present

## 2014-01-04 DIAGNOSIS — R609 Edema, unspecified: Secondary | ICD-10-CM | POA: Diagnosis not present

## 2014-01-19 DIAGNOSIS — H1130 Conjunctival hemorrhage, unspecified eye: Secondary | ICD-10-CM | POA: Diagnosis not present

## 2014-02-07 DIAGNOSIS — M25569 Pain in unspecified knee: Secondary | ICD-10-CM | POA: Diagnosis not present

## 2014-03-22 DIAGNOSIS — M1710 Unilateral primary osteoarthritis, unspecified knee: Secondary | ICD-10-CM | POA: Diagnosis not present

## 2014-03-31 DIAGNOSIS — M25569 Pain in unspecified knee: Secondary | ICD-10-CM | POA: Diagnosis not present

## 2014-04-07 DIAGNOSIS — M1710 Unilateral primary osteoarthritis, unspecified knee: Secondary | ICD-10-CM | POA: Diagnosis not present

## 2014-04-18 DIAGNOSIS — M1710 Unilateral primary osteoarthritis, unspecified knee: Secondary | ICD-10-CM | POA: Diagnosis not present

## 2014-04-18 DIAGNOSIS — M25569 Pain in unspecified knee: Secondary | ICD-10-CM | POA: Diagnosis not present

## 2014-04-27 DIAGNOSIS — M1710 Unilateral primary osteoarthritis, unspecified knee: Secondary | ICD-10-CM | POA: Diagnosis not present

## 2014-04-27 DIAGNOSIS — M25569 Pain in unspecified knee: Secondary | ICD-10-CM | POA: Diagnosis not present

## 2014-05-09 DIAGNOSIS — R972 Elevated prostate specific antigen [PSA]: Secondary | ICD-10-CM | POA: Diagnosis not present

## 2014-05-09 DIAGNOSIS — N139 Obstructive and reflux uropathy, unspecified: Secondary | ICD-10-CM | POA: Diagnosis not present

## 2014-05-09 DIAGNOSIS — N401 Enlarged prostate with lower urinary tract symptoms: Secondary | ICD-10-CM | POA: Diagnosis not present

## 2014-06-17 DIAGNOSIS — I10 Essential (primary) hypertension: Secondary | ICD-10-CM | POA: Diagnosis not present

## 2014-06-17 DIAGNOSIS — R739 Hyperglycemia, unspecified: Secondary | ICD-10-CM | POA: Diagnosis not present

## 2014-07-11 DIAGNOSIS — M1712 Unilateral primary osteoarthritis, left knee: Secondary | ICD-10-CM | POA: Diagnosis not present

## 2014-07-20 DIAGNOSIS — L739 Follicular disorder, unspecified: Secondary | ICD-10-CM | POA: Diagnosis not present

## 2014-07-20 DIAGNOSIS — Z23 Encounter for immunization: Secondary | ICD-10-CM | POA: Diagnosis not present

## 2014-07-20 DIAGNOSIS — M109 Gout, unspecified: Secondary | ICD-10-CM | POA: Diagnosis not present

## 2014-07-20 DIAGNOSIS — E780 Pure hypercholesterolemia: Secondary | ICD-10-CM | POA: Diagnosis not present

## 2014-07-20 DIAGNOSIS — I10 Essential (primary) hypertension: Secondary | ICD-10-CM | POA: Diagnosis not present

## 2014-08-04 DIAGNOSIS — M1712 Unilateral primary osteoarthritis, left knee: Secondary | ICD-10-CM | POA: Diagnosis not present

## 2014-08-24 NOTE — H&P (Signed)
TOTAL KNEE ADMISSION H&P  Patient is being admitted for left total knee arthroplasty.  Subjective:  Chief Complaint:  Left knee primary OA / pain.  HPI: BYRD RUSHLOW, 79 y.o. male, has a history of pain and functional disability in the left knee due to arthritis and has failed non-surgical conservative treatments for greater than 12 weeks to include  NSAID's and/or analgesics, corticosteriod injections, viscosupplementation injections and activity modification.  Onset of symptoms was gradual, starting >10 years ago with gradually worsening course since that time. The patient noted prior procedures on the knee to include  arthroscopy on the left knee(s).  Patient currently rates pain in the left knee(s) at 10 out of 10 with activity. Patient has night pain, worsening of pain with activity and weight bearing, pain that interferes with activities of daily living, pain with passive range of motion, crepitus and joint swelling.  Patient has evidence of periarticular osteophytes and joint space narrowing by imaging studies.  There is no active infection.  Risks, benefits and expectations were discussed with the patient.  Risks including but not limited to the risk of anesthesia, blood clots, nerve damage, blood vessel damage, failure of the prosthesis, infection and up to and including death.  Patient understand the risks, benefits and expectations and wishes to proceed with surgery.   PCP: Jani Gravel, MD  D/C Plans:      Home / SNF  Post-op Meds:       No Rx given   Tranexamic Acid:      To be given - IV   Decadron:      Is to be given  FYI:     ASA post-op  Norco post-op    Past Medical History  Diagnosis Date  . Hypertension   . Phlebitis     right arm  at least 20 years ago   . Pneumonia     hx of pneumonia as a child   . GERD (gastroesophageal reflux disease)   . Arthritis     Past Surgical History  Procedure Laterality Date  . Pilonidal cyst removal     . Cholecystectomy     . Bone removed from little toe right foot        Allergies  Allergen Reactions  . Other     Beer- Swelling   . Sunflower Oil Swelling  . Sulfa Antibiotics Rash    History  Substance Use Topics  . Smoking status: Former Smoker    Quit date: 08/19/2006  . Smokeless tobacco: Never Used  . Alcohol Use: 3.6 oz/week    6 Shots of liquor per week       Review of Systems  Constitutional: Negative.   HENT: Negative.   Eyes: Negative.   Respiratory: Positive for cough and shortness of breath (on exertion).   Cardiovascular: Negative.   Gastrointestinal: Positive for heartburn.  Genitourinary: Positive for frequency.  Musculoskeletal: Positive for back pain and joint pain.  Skin: Negative.   Neurological: Negative.   Endo/Heme/Allergies: Negative.   Psychiatric/Behavioral: Negative.     Objective:  Physical Exam  Constitutional: He is oriented to person, place, and time. He appears well-developed and well-nourished.  HENT:  Head: Normocephalic and atraumatic.  Eyes: Pupils are equal, round, and reactive to light.  Neck: Neck supple. No JVD present. No tracheal deviation present. No thyromegaly present.  Cardiovascular: Normal rate, regular rhythm, normal heart sounds and intact distal pulses.   Respiratory: Effort normal and breath sounds normal. No stridor. No  respiratory distress. He has no wheezes.  GI: Soft. There is no tenderness. There is no guarding.  Musculoskeletal:       Left knee: He exhibits decreased range of motion, swelling and bony tenderness. He exhibits no ecchymosis, no deformity, no laceration, no erythema and normal alignment. Tenderness found.  Lymphadenopathy:    He has no cervical adenopathy.  Neurological: He is alert and oriented to person, place, and time.  Skin: Skin is warm and dry.  Psychiatric: He has a normal mood and affect.      Imaging Review Plain radiographs demonstrate severe degenerative joint disease of the left knee(s). The  overall alignment is neutral. The bone quality appears to be good for age and reported activity level.  Assessment/Plan:  End stage arthritis, left knee   The patient history, physical examination, clinical judgment of the provider and imaging studies are consistent with end stage degenerative joint disease of the left knee(s) and total knee arthroplasty is deemed medically necessary. The treatment options including medical management, injection therapy arthroscopy and arthroplasty were discussed at length. The risks and benefits of total knee arthroplasty were presented and reviewed. The risks due to aseptic loosening, infection, stiffness, patella tracking problems, thromboembolic complications and other imponderables were discussed. The patient acknowledged the explanation, agreed to proceed with the plan and consent was signed. Patient is being admitted for inpatient treatment for surgery, pain control, PT, OT, prophylactic antibiotics, VTE prophylaxis, progressive ambulation and ADL's and discharge planning. The patient is planning to be discharged to skilled nursing facility vs home.     West Pugh Babish   PA-C  09/12/2014, 9:30 AM

## 2014-09-07 NOTE — Patient Instructions (Addendum)
Joshua [name]  09/07/2014   Your procedure is scheduled on: 09/13/2014    Report to [facility] Main  Entrance and follow signs to               [place] at       0700 AM.  Call this number if you have problems the morning of surgery [phone]   Remember:  Do not eat food or drink liquids :After Midnight.     Take these medicines the morning of surgery with A SIP OF WATER:  Allopurinol,  Protonix                                You may not have any metal on your body including hair pins and              piercings  Do not wear jewelry,  lotions, powders or perfumes., deodorant.                            Men may shave face and neck.   Do not bring valuables to the hospital. [place].  Contacts, dentures or bridgework may not be worn into surgery.  Leave suitcase in the car. After surgery it may be brought to your room.         Special Instructions:coughing and deep breathing exercises, leg exercises               Please read over the following fact sheets you were given: _____________________________________________________________________             [facility] - Preparing for Surgery Before surgery, you can play an important role.  Because skin is not sterile, your skin needs to be as free of germs as possible.  You can reduce the number of germs on your skin by washing with CHG (chlorahexidine gluconate) soap before surgery.  CHG is an antiseptic cleaner which kills germs and bonds with the skin to continue killing germs even after washing. Please DO NOT use if you have an allergy to CHG or antibacterial soaps.  If your skin becomes reddened/irritated stop using the CHG and inform your nurse when you arrive at Short Stay. Do not shave (including legs and underarms) for at least 48 hours prior to the first CHG shower.  You may shave your face/neck. Please follow these instructions  carefully:  1.  Shower with CHG Soap the night before surgery and the  morning of Surgery.  2.  If you choose to wash your hair, wash your hair first as usual with your  normal  shampoo.  3.  After you shampoo, rinse your hair and body thoroughly to remove the  shampoo.                           4.  Use CHG as you would any other liquid soap.  You can apply chg directly  to the skin and wash                       Gently with a scrungie or clean washcloth.  5.  Apply the CHG Soap  to your body ONLY FROM THE NECK DOWN.   Do not use on face/ open                           Wound or open sores. Avoid contact with eyes, ears mouth and genitals (private parts).                       Wash face,  Genitals (private parts) with your normal soap.             6.  Wash thoroughly, paying special attention to the area where your surgery  will be performed.  7.  Thoroughly rinse your body with warm water from the neck down.  8.  DO NOT shower/wash with your normal soap after using and rinsing off  the CHG Soap.                9.  Pat yourself dry with a clean towel.            10.  Wear clean pajamas.            11.  Place clean sheets on your bed the night of your first shower and do not  sleep with pets. Day of Surgery : Do not apply any lotions/deodorants the morning of surgery.  Please wear clean clothes to the hospital/surgery center.  FAILURE TO FOLLOW THESE INSTRUCTIONS MAY RESULT IN THE CANCELLATION OF YOUR SURGERY PATIENT SIGNATURE_________________________________  NURSE SIGNATURE__________________________________  ________________________________________________________________________  WHAT IS A BLOOD TRANSFUSION? Blood Transfusion Information  A transfusion is the replacement of blood or some of its parts. Blood is made up of multiple cells which provide different functions.  Red blood cells carry oxygen and are used for blood loss replacement.  White blood cells fight against  infection.  Platelets control bleeding.  Plasma helps clot blood.  Other blood products are available for specialized needs, such as hemophilia or other clotting disorders. BEFORE THE TRANSFUSION  Who gives blood for transfusions?   Healthy volunteers who are fully evaluated to make sure their blood is safe. This is blood bank blood. Transfusion therapy is the safest it has ever been in the practice of medicine. Before blood is taken from a donor, a complete history is taken to make sure that person has no history of diseases nor engages in risky social behavior (examples are intravenous drug use or sexual activity with multiple partners). The donor's travel history is screened to minimize risk of transmitting infections, such as malaria. The donated blood is tested for signs of infectious diseases, such as HIV and hepatitis. The blood is then tested to be sure it is compatible with you in order to minimize the chance of a transfusion reaction. If you or a relative donates blood, this is often done in anticipation of surgery and is not appropriate for emergency situations. It takes many days to process the donated blood. RISKS AND COMPLICATIONS Although transfusion therapy is very safe and saves many lives, the main dangers of transfusion include:  1. Getting an infectious disease. 2. Developing a transfusion reaction. This is an allergic reaction to something in the blood you were given. Every precaution is taken to prevent this. The decision to have a blood transfusion has been considered carefully by your caregiver before blood is given. Blood is not given unless the benefits outweigh the risks. AFTER THE TRANSFUSION  Right after receiving a blood transfusion, you will  usually feel much better and more energetic. This is especially true if your red blood cells have gotten low (anemic). The transfusion raises the level of the red blood cells which carry oxygen, and this usually causes an energy  increase.  The nurse administering the transfusion will monitor you carefully for complications. HOME CARE INSTRUCTIONS  No special instructions are needed after a transfusion. You may find your energy is better. Speak with your caregiver about any limitations on activity for underlying diseases you may have. SEEK MEDICAL CARE IF:   Your condition is not improving after your transfusion.  You develop redness or irritation at the intravenous (IV) site. SEEK IMMEDIATE MEDICAL CARE IF:  Any of the following symptoms occur over the next 12 hours:  Shaking chills.  You have a temperature by mouth above 102 F (38.9 C), not controlled by medicine.  Chest, back, or muscle pain.  People around you feel you are not acting correctly or are confused.  Shortness of breath or difficulty breathing.  Dizziness and fainting.  You get a rash or develop hives.  You have a decrease in urine output.  Your urine turns a dark color or changes to pink, red, or brown. Any of the following symptoms occur over the next 10 days:  You have a temperature by mouth above 102 F (38.9 C), not controlled by medicine.  Shortness of breath.  Weakness after normal activity.  The white part of the eye turns yellow (jaundice).  You have a decrease in the amount of urine or are urinating less often.  Your urine turns a dark color or changes to pink, red, or brown. Document Released: 08/02/2000 Document Revised: 10/28/2011 Document Reviewed: 03/21/2008 Exit[employer] Patient Information 2014 [place].  _______________________________________________________________________  Incentive Spirometer  An incentive spirometer is a tool that can help keep your lungs clear and active. This tool measures how well you are filling your lungs with each breath. Taking long deep breaths may help reverse or decrease the chance of developing breathing (pulmonary) problems (especially infection) following:  A long  period of time when you are unable to move or be active. BEFORE THE PROCEDURE   If the spirometer includes an indicator to show your best effort, your nurse or respiratory therapist will set it to a desired goal.  If possible, sit up straight or lean slightly forward. Try not to slouch.  Hold the incentive spirometer in an upright position. INSTRUCTIONS FOR USE  3. Sit on the edge of your bed if possible, or sit up as far as you can in bed or on a chair. 4. Hold the incentive spirometer in an upright position. 5. Breathe out normally. 6. Place the mouthpiece in your mouth and seal your lips tightly around it. 7. Breathe in slowly and as deeply as possible, raising the piston or the ball toward the top of the column. 8. Hold your breath for 3-5 seconds or for as long as possible. Allow the piston or ball to fall to the bottom of the column. 9. Remove the mouthpiece from your mouth and breathe out normally. 10. Rest for a few seconds and repeat Steps 1 through 7 at least 10 times every 1-2 hours when you are awake. Take your time and take a few normal breaths between deep breaths. 11. The spirometer may include an indicator to show your best effort. Use the indicator as a goal to work toward during each repetition. 12. After each set of 10 deep breaths, practice coughing  to be sure your lungs are clear. If you have an incision (the cut made at the time of surgery), support your incision when coughing by placing a pillow or rolled up towels firmly against it. Once you are able to get out of bed, walk around indoors and cough well. You may stop using the incentive spirometer when instructed by your caregiver.  RISKS AND COMPLICATIONS  Take your time so you do not get dizzy or light-headed.  If you are in pain, you may need to take or ask for pain medication before doing incentive spirometry. It is harder to take a deep breath if you are having pain. AFTER USE  Rest and breathe slowly and  easily.  It can be helpful to keep track of a log of your progress. Your caregiver can provide you with a simple table to help with this. If you are using the spirometer at home, follow these instructions: [place] IF:   You are having difficultly using the spirometer.  You have trouble using the spirometer as often as instructed.  Your pain medication is not giving enough relief while using the spirometer.  You develop fever of 100.5 F (38.1 C) or higher. SEEK IMMEDIATE MEDICAL CARE IF:   You cough up bloody sputum that had not been present before.  You develop fever of 102 F (38.9 C) or greater.  You develop worsening pain at or near the incision site. MAKE SURE YOU:   Understand these instructions.  Will watch your condition.  Will get help right away if you are not doing well or get worse. Document Released: 12/16/2006 Document Revised: 10/28/2011 Document Reviewed: 02/16/2007 [facility] Patient Information 2014 [place], [place].   ________________________________________________________________________

## 2014-09-08 ENCOUNTER — Encounter (HOSPITAL_COMMUNITY): Payer: Self-pay

## 2014-09-08 ENCOUNTER — Encounter (HOSPITAL_COMMUNITY)
Admission: RE | Admit: 2014-09-08 | Discharge: 2014-09-08 | Disposition: A | Discharge status: Home / Self Care | Payer: Medicare Other | Source: Ambulatory Visit | Attending: Orthopedic Surgery | Admitting: Orthopedic Surgery

## 2014-09-08 ENCOUNTER — Ambulatory Visit (HOSPITAL_COMMUNITY)
Admission: RE | Admit: 2014-09-08 | Discharge: 2014-09-08 | Disposition: A | Discharge status: Home / Self Care | Payer: Medicare Other | Source: Ambulatory Visit | Attending: Orthopedic Surgery | Admitting: Orthopedic Surgery

## 2014-09-08 DIAGNOSIS — I517 Cardiomegaly: Secondary | ICD-10-CM | POA: Diagnosis not present

## 2014-09-08 DIAGNOSIS — Z01818 Encounter for other preprocedural examination: Secondary | ICD-10-CM

## 2014-09-08 DIAGNOSIS — I10 Essential (primary) hypertension: Secondary | ICD-10-CM | POA: Insufficient documentation

## 2014-09-08 HISTORY — DX: Pneumonia, unspecified organism: J18.9

## 2014-09-08 HISTORY — DX: Phlebitis and thrombophlebitis of unspecified site: I80.9

## 2014-09-08 HISTORY — DX: Follicular disorder, unspecified: L73.9

## 2014-09-08 HISTORY — DX: Gastro-esophageal reflux disease without esophagitis: K21.9

## 2014-09-08 HISTORY — DX: Essential (primary) hypertension: I10

## 2014-09-08 HISTORY — DX: Unspecified osteoarthritis, unspecified site: M19.90

## 2014-09-08 LAB — PROTIME-INR
INR: 1.09 (ref 0.00–1.49)
Prothrombin Time: 14.2 seconds (ref 11.6–15.2)

## 2014-09-08 LAB — URINALYSIS, ROUTINE W REFLEX MICROSCOPIC
Bilirubin Urine: NEGATIVE
Glucose, UA: NEGATIVE mg/dL
Hgb urine dipstick: NEGATIVE
Ketones, ur: NEGATIVE mg/dL
Leukocytes, UA: NEGATIVE
Nitrite: NEGATIVE
Protein, ur: NEGATIVE mg/dL
Specific Gravity, Urine: 1.023 (ref 1.005–1.030)
Urobilinogen, UA: 0.2 mg/dL (ref 0.0–1.0)
pH: 5.5 (ref 5.0–8.0)

## 2014-09-08 LAB — CBC
HCT: 47.7 % (ref 39.0–52.0)
Hemoglobin: 15.3 g/dL (ref 13.0–17.0)
MCH: 29 pg (ref 26.0–34.0)
MCHC: 32.1 g/dL (ref 30.0–36.0)
MCV: 90.3 fL (ref 78.0–100.0)
Platelets: 171 10*3/uL (ref 150–400)
RBC: 5.28 MIL/uL (ref 4.22–5.81)
RDW: 15.8 % — ABNORMAL HIGH (ref 11.5–15.5)
WBC: 3.9 10*3/uL — ABNORMAL LOW (ref 4.0–10.5)

## 2014-09-08 LAB — APTT: aPTT: 29 seconds (ref 24–37)

## 2014-09-08 LAB — BASIC METABOLIC PANEL
Anion gap: 5 (ref 5–15)
BUN: 20 mg/dL (ref 6–23)
CO2: 30 mmol/L (ref 19–32)
Calcium: 9.2 mg/dL (ref 8.4–10.5)
Chloride: 105 mEq/L (ref 96–112)
Creatinine, Ser: 1.26 mg/dL (ref 0.50–1.35)
GFR calc Af Amer: 62 mL/min — ABNORMAL LOW (ref >90)
GFR calc non Af Amer: 54 mL/min — ABNORMAL LOW (ref >90)
Glucose, Bld: 97 mg/dL (ref 70–99)
Potassium: 4.1 mmol/L (ref 3.5–5.1)
Sodium: 140 mmol/L (ref 135–145)

## 2014-09-08 LAB — SURGICAL PCR SCREEN
MRSA, PCR: NEGATIVE
Staphylococcus aureus: NEGATIVE

## 2014-09-08 NOTE — Progress Notes (Signed)
Requested last office visit note from DR Jani Gravel ( PCP) regarding antibiotic treatment for " knots on back of scalp."

## 2014-09-08 NOTE — Progress Notes (Signed)
Clearance- Dr Maudie Mercury- 07/20/2014 on chart

## 2014-09-08 NOTE — Progress Notes (Signed)
Final EKG done 09/08/2014 in [facility].

## 2014-09-12 ENCOUNTER — Encounter (HOSPITAL_COMMUNITY): Payer: Self-pay

## 2014-09-12 MED ORDER — DEXTROSE 5 % IV SOLN
3.0000 g | INTRAVENOUS | Status: AC
  Administered 2014-09-13: 3 g via INTRAVENOUS
  Filled 2014-09-12: qty 3000

## 2014-09-12 NOTE — Progress Notes (Signed)
Received last office visit note from Dr Maudie Mercury dated 07/20/2014.  Which refers to Folliculitis - posterior scalp per office visit note.  Placed office visit note on chart.

## 2014-09-12 NOTE — Progress Notes (Signed)
Requested last office visit note from office of Dr Jani Gravel regarding antibiotic treatment for " bumps on back of scalp".  This is second request.

## 2014-09-13 ENCOUNTER — Inpatient Hospital Stay (HOSPITAL_COMMUNITY)
Admission: RE | Admit: 2014-09-13 | Discharge: 2014-09-17 | DRG: 469 | Disposition: A | Discharge status: Home Health | Payer: Medicare Other | Source: Ambulatory Visit | Attending: Orthopedic Surgery | Admitting: Orthopedic Surgery

## 2014-09-13 ENCOUNTER — Inpatient Hospital Stay (HOSPITAL_COMMUNITY): Payer: Medicare Other | Admitting: Certified Registered Nurse Anesthetist

## 2014-09-13 ENCOUNTER — Encounter (HOSPITAL_COMMUNITY)
Admission: RE | Disposition: A | Discharge status: Home Health | Payer: Self-pay | Source: Ambulatory Visit | Attending: Orthopedic Surgery

## 2014-09-13 ENCOUNTER — Encounter (HOSPITAL_COMMUNITY): Payer: Self-pay | Admitting: *Deleted

## 2014-09-13 DIAGNOSIS — Z96659 Presence of unspecified artificial knee joint: Secondary | ICD-10-CM

## 2014-09-13 DIAGNOSIS — I272 Other secondary pulmonary hypertension: Secondary | ICD-10-CM | POA: Diagnosis not present

## 2014-09-13 DIAGNOSIS — M25562 Pain in left knee: Secondary | ICD-10-CM | POA: Diagnosis not present

## 2014-09-13 DIAGNOSIS — M659 Synovitis and tenosynovitis, unspecified: Secondary | ICD-10-CM | POA: Diagnosis present

## 2014-09-13 DIAGNOSIS — J439 Emphysema, unspecified: Secondary | ICD-10-CM | POA: Diagnosis present

## 2014-09-13 DIAGNOSIS — Z8672 Personal history of thrombophlebitis: Secondary | ICD-10-CM | POA: Diagnosis not present

## 2014-09-13 DIAGNOSIS — I2699 Other pulmonary embolism without acute cor pulmonale: Secondary | ICD-10-CM | POA: Diagnosis not present

## 2014-09-13 DIAGNOSIS — Z6833 Body mass index (BMI) 33.0-33.9, adult: Secondary | ICD-10-CM

## 2014-09-13 DIAGNOSIS — R0602 Shortness of breath: Secondary | ICD-10-CM

## 2014-09-13 DIAGNOSIS — M1712 Unilateral primary osteoarthritis, left knee: Secondary | ICD-10-CM | POA: Diagnosis present

## 2014-09-13 DIAGNOSIS — I10 Essential (primary) hypertension: Secondary | ICD-10-CM | POA: Diagnosis present

## 2014-09-13 DIAGNOSIS — M179 Osteoarthritis of knee, unspecified: Secondary | ICD-10-CM | POA: Diagnosis not present

## 2014-09-13 DIAGNOSIS — J9601 Acute respiratory failure with hypoxia: Secondary | ICD-10-CM | POA: Diagnosis not present

## 2014-09-13 DIAGNOSIS — I252 Old myocardial infarction: Secondary | ICD-10-CM | POA: Diagnosis not present

## 2014-09-13 DIAGNOSIS — Z96652 Presence of left artificial knee joint: Secondary | ICD-10-CM | POA: Diagnosis not present

## 2014-09-13 DIAGNOSIS — K219 Gastro-esophageal reflux disease without esophagitis: Secondary | ICD-10-CM | POA: Diagnosis not present

## 2014-09-13 DIAGNOSIS — Z87891 Personal history of nicotine dependence: Secondary | ICD-10-CM | POA: Diagnosis not present

## 2014-09-13 HISTORY — PX: TOTAL KNEE ARTHROPLASTY: SHX125

## 2014-09-13 LAB — TYPE AND SCREEN
ABO/RH(D): A POS
Antibody Screen: NEGATIVE

## 2014-09-13 LAB — ABO/RH: ABO/RH(D): A POS

## 2014-09-13 SURGERY — ARTHROPLASTY, KNEE, TOTAL
Anesthesia: Spinal | Site: Knee | Laterality: Left

## 2014-09-13 MED ORDER — PANTOPRAZOLE SODIUM 40 MG PO TBEC
40.0000 mg | DELAYED_RELEASE_TABLET | Freq: Every day | ORAL | Status: DC
  Administered 2014-09-14 – 2014-09-17 (×4): 40 mg via ORAL
  Filled 2014-09-13 (×5): qty 1

## 2014-09-13 MED ORDER — HYDROCODONE-ACETAMINOPHEN 7.5-325 MG PO TABS
1.0000 | ORAL_TABLET | ORAL | Status: DC
  Administered 2014-09-13: 1 via ORAL
  Administered 2014-09-13: 2 via ORAL
  Administered 2014-09-14 (×2): 1 via ORAL
  Administered 2014-09-14 – 2014-09-16 (×12): 2 via ORAL
  Administered 2014-09-16: 1 via ORAL
  Administered 2014-09-17 (×2): 2 via ORAL
  Administered 2014-09-17: 1 via ORAL
  Filled 2014-09-13 (×7): qty 2
  Filled 2014-09-13: qty 1
  Filled 2014-09-13 (×12): qty 2

## 2014-09-13 MED ORDER — DEXAMETHASONE SODIUM PHOSPHATE 10 MG/ML IJ SOLN
10.0000 mg | Freq: Once | INTRAMUSCULAR | Status: AC
  Administered 2014-09-14: 10 mg via INTRAVENOUS
  Filled 2014-09-13: qty 1

## 2014-09-13 MED ORDER — LIDOCAINE HCL (CARDIAC) 20 MG/ML IV SOLN
INTRAVENOUS | Status: DC | PRN
  Administered 2014-09-13: 50 mg via INTRAVENOUS

## 2014-09-13 MED ORDER — BISACODYL 10 MG RE SUPP: 10.0000 mg | Freq: Every day | RECTAL | Status: DC | PRN

## 2014-09-13 MED ORDER — HYDROMORPHONE HCL 1 MG/ML IJ SOLN
0.5000 mg | INTRAMUSCULAR | Status: DC | PRN
  Administered 2014-09-13: 1 mg via INTRAVENOUS
  Administered 2014-09-13: 2 mg via INTRAVENOUS
  Administered 2014-09-15: 1 mg via INTRAVENOUS
  Filled 2014-09-13: qty 2
  Filled 2014-09-13 (×2): qty 1

## 2014-09-13 MED ORDER — FUROSEMIDE 20 MG PO TABS
20.0000 mg | ORAL_TABLET | ORAL | Status: DC
  Administered 2014-09-14 – 2014-09-16 (×2): 20 mg via ORAL
  Filled 2014-09-13 (×2): qty 1

## 2014-09-13 MED ORDER — POTASSIUM CHLORIDE 2 MEQ/ML IV SOLN
INTRAVENOUS | Status: DC
  Administered 2014-09-13 (×2): via INTRAVENOUS
  Filled 2014-09-13 (×7): qty 1000

## 2014-09-13 MED ORDER — OXYCODONE HCL 5 MG PO TABS: 5.0000 mg | ORAL_TABLET | Freq: Once | ORAL | Status: DC | PRN

## 2014-09-13 MED ORDER — METHOCARBAMOL 1000 MG/10ML IJ SOLN
500.0000 mg | Freq: Four times a day (QID) | INTRAVENOUS | Status: DC | PRN
  Administered 2014-09-13: 500 mg via INTRAVENOUS
  Filled 2014-09-13 (×2): qty 5

## 2014-09-13 MED ORDER — COLCHICINE 0.6 MG PO TABS
0.6000 mg | ORAL_TABLET | Freq: Every day | ORAL | Status: DC
  Administered 2014-09-13 – 2014-09-17 (×5): 0.6 mg via ORAL
  Filled 2014-09-13 (×6): qty 1

## 2014-09-13 MED ORDER — MAGNESIUM CITRATE PO SOLN: 1.0000 | Freq: Once | ORAL | Status: AC | PRN

## 2014-09-13 MED ORDER — CEFAZOLIN SODIUM-DEXTROSE 2-3 GM-% IV SOLR
2.0000 g | Freq: Four times a day (QID) | INTRAVENOUS | Status: AC
  Administered 2014-09-13 (×2): 2 g via INTRAVENOUS
  Filled 2014-09-13 (×2): qty 50

## 2014-09-13 MED ORDER — MENTHOL 3 MG MT LOZG
1.0000 | LOZENGE | OROMUCOSAL | Status: DC | PRN
  Filled 2014-09-13: qty 9

## 2014-09-13 MED ORDER — ALUM & MAG HYDROXIDE-SIMETH 200-200-20 MG/5ML PO SUSP
30.0000 mL | ORAL | Status: DC | PRN
  Administered 2014-09-14 (×2): 30 mL via ORAL
  Filled 2014-09-13 (×2): qty 30

## 2014-09-13 MED ORDER — METOCLOPRAMIDE HCL 5 MG/ML IJ SOLN
5.0000 mg | Freq: Three times a day (TID) | INTRAMUSCULAR | Status: DC | PRN
Start: 2014-09-13 — End: 2014-09-17

## 2014-09-13 MED ORDER — MIDAZOLAM HCL 2 MG/2ML IJ SOLN
INTRAMUSCULAR | Status: AC
  Filled 2014-09-13: qty 2

## 2014-09-13 MED ORDER — ONDANSETRON HCL 4 MG PO TABS
4.0000 mg | ORAL_TABLET | Freq: Four times a day (QID) | ORAL | Status: DC | PRN
  Administered 2014-09-16 – 2014-09-17 (×2): 4 mg via ORAL
  Filled 2014-09-13 (×2): qty 1

## 2014-09-13 MED ORDER — TRANEXAMIC ACID 100 MG/ML IV SOLN
1000.0000 mg | Freq: Once | INTRAVENOUS | Status: AC
  Administered 2014-09-13: 1000 mg via INTRAVENOUS
  Filled 2014-09-13: qty 10

## 2014-09-13 MED ORDER — DOCUSATE SODIUM 100 MG PO CAPS
100.0000 mg | ORAL_CAPSULE | Freq: Two times a day (BID) | ORAL | Status: DC
  Administered 2014-09-13 – 2014-09-17 (×8): 100 mg via ORAL
  Filled 2014-09-13 (×5): qty 1

## 2014-09-13 MED ORDER — METHOCARBAMOL 500 MG PO TABS
500.0000 mg | ORAL_TABLET | Freq: Four times a day (QID) | ORAL | Status: DC | PRN
  Administered 2014-09-13 – 2014-09-16 (×5): 500 mg via ORAL
  Filled 2014-09-13 (×7): qty 1

## 2014-09-13 MED ORDER — CHLORHEXIDINE GLUCONATE 4 % EX LIQD: 60.0000 mL | Freq: Once | CUTANEOUS | Status: DC

## 2014-09-13 MED ORDER — METOCLOPRAMIDE HCL 10 MG PO TABS
5.0000 mg | ORAL_TABLET | Freq: Three times a day (TID) | ORAL | Status: DC | PRN
  Administered 2014-09-14: 10 mg via ORAL
  Filled 2014-09-13: qty 1

## 2014-09-13 MED ORDER — ONDANSETRON HCL 4 MG/2ML IJ SOLN
4.0000 mg | Freq: Four times a day (QID) | INTRAMUSCULAR | Status: DC | PRN
  Administered 2014-09-14 – 2014-09-17 (×2): 4 mg via INTRAVENOUS
  Filled 2014-09-13 (×2): qty 2

## 2014-09-13 MED ORDER — SODIUM CHLORIDE 0.9 % IJ SOLN
INTRAMUSCULAR | Status: AC
  Filled 2014-09-13: qty 10

## 2014-09-13 MED ORDER — EPHEDRINE SULFATE 50 MG/ML IJ SOLN
INTRAMUSCULAR | Status: DC | PRN
  Administered 2014-09-13: 10 mg via INTRAVENOUS

## 2014-09-13 MED ORDER — PROPOFOL 10 MG/ML IV BOLUS
INTRAVENOUS | Status: AC
  Filled 2014-09-13: qty 20

## 2014-09-13 MED ORDER — KETOROLAC TROMETHAMINE 30 MG/ML IJ SOLN
INTRAMUSCULAR | Status: DC | PRN
  Administered 2014-09-13: 30 mg via INTRAVENOUS

## 2014-09-13 MED ORDER — SODIUM CHLORIDE 0.9 % IJ SOLN
INTRAMUSCULAR | Status: AC
  Filled 2014-09-13: qty 50

## 2014-09-13 MED ORDER — DEXAMETHASONE SODIUM PHOSPHATE 10 MG/ML IJ SOLN: 10.0000 mg | Freq: Once | INTRAMUSCULAR | Status: DC

## 2014-09-13 MED ORDER — FERROUS SULFATE 325 (65 FE) MG PO TABS
325.0000 mg | ORAL_TABLET | Freq: Three times a day (TID) | ORAL | Status: DC
  Administered 2014-09-14 – 2014-09-15 (×5): 325 mg via ORAL
  Filled 2014-09-13 (×8): qty 1

## 2014-09-13 MED ORDER — KETOROLAC TROMETHAMINE 30 MG/ML IJ SOLN
INTRAMUSCULAR | Status: AC
  Filled 2014-09-13: qty 1

## 2014-09-13 MED ORDER — LIDOCAINE HCL (CARDIAC) 20 MG/ML IV SOLN
INTRAVENOUS | Status: AC
  Filled 2014-09-13: qty 5

## 2014-09-13 MED ORDER — DIPHENHYDRAMINE HCL 25 MG PO CAPS: 25.0000 mg | ORAL_CAPSULE | Freq: Four times a day (QID) | ORAL | Status: DC | PRN

## 2014-09-13 MED ORDER — DOXAZOSIN MESYLATE 4 MG PO TABS
4.0000 mg | ORAL_TABLET | Freq: Every day | ORAL | Status: DC
Start: 2014-09-13 — End: 2014-09-17
  Administered 2014-09-13 – 2014-09-16 (×4): 4 mg via ORAL
  Filled 2014-09-13 (×5): qty 1

## 2014-09-13 MED ORDER — EPHEDRINE SULFATE 50 MG/ML IJ SOLN
INTRAMUSCULAR | Status: AC
  Filled 2014-09-13: qty 1

## 2014-09-13 MED ORDER — BUPIVACAINE IN DEXTROSE 0.75-8.25 % IT SOLN
INTRATHECAL | Status: DC | PRN
  Administered 2014-09-13: 2 mL via INTRATHECAL

## 2014-09-13 MED ORDER — FENTANYL CITRATE 0.05 MG/ML IJ SOLN
INTRAMUSCULAR | Status: DC | PRN
Start: 2014-09-13 — End: 2014-09-13
  Administered 2014-09-13 (×4): 25 ug via INTRAVENOUS

## 2014-09-13 MED ORDER — OXYCODONE HCL 5 MG/5ML PO SOLN
5.0000 mg | Freq: Once | ORAL | Status: DC | PRN
  Filled 2014-09-13: qty 5

## 2014-09-13 MED ORDER — BUPIVACAINE-EPINEPHRINE (PF) 0.25% -1:200000 IJ SOLN
INTRAMUSCULAR | Status: DC | PRN
  Administered 2014-09-13: 30 mL

## 2014-09-13 MED ORDER — PHENYLEPHRINE HCL 10 MG/ML IJ SOLN
INTRAMUSCULAR | Status: DC | PRN
  Administered 2014-09-13 (×3): 80 ug via INTRAVENOUS

## 2014-09-13 MED ORDER — PROPOFOL INFUSION 10 MG/ML OPTIME
INTRAVENOUS | Status: DC | PRN
  Administered 2014-09-13: 100 ug/kg/min via INTRAVENOUS

## 2014-09-13 MED ORDER — 0.9 % SODIUM CHLORIDE (POUR BTL) OPTIME
TOPICAL | Status: DC | PRN
  Administered 2014-09-13: 1000 mL

## 2014-09-13 MED ORDER — FENTANYL CITRATE 0.05 MG/ML IJ SOLN
INTRAMUSCULAR | Status: AC
  Filled 2014-09-13: qty 2

## 2014-09-13 MED ORDER — ASPIRIN EC 325 MG PO TBEC
325.0000 mg | DELAYED_RELEASE_TABLET | Freq: Two times a day (BID) | ORAL | Status: DC
Start: 2014-09-14 — End: 2014-09-15
  Administered 2014-09-14 – 2014-09-15 (×3): 325 mg via ORAL
  Filled 2014-09-13 (×5): qty 1

## 2014-09-13 MED ORDER — LACTATED RINGERS IV SOLN
INTRAVENOUS | Status: DC
  Administered 2014-09-13: 11:00:00 via INTRAVENOUS
  Administered 2014-09-13: 1000 mL via INTRAVENOUS

## 2014-09-13 MED ORDER — HYDROMORPHONE HCL 1 MG/ML IJ SOLN: 0.2500 mg | INTRAMUSCULAR | Status: DC | PRN

## 2014-09-13 MED ORDER — PHENOL 1.4 % MT LIQD
1.0000 | OROMUCOSAL | Status: DC | PRN
  Filled 2014-09-13: qty 177

## 2014-09-13 MED ORDER — ONDANSETRON HCL 4 MG/2ML IJ SOLN
INTRAMUSCULAR | Status: AC
  Filled 2014-09-13: qty 2

## 2014-09-13 MED ORDER — DEXAMETHASONE SODIUM PHOSPHATE 10 MG/ML IJ SOLN
INTRAMUSCULAR | Status: AC
  Filled 2014-09-13: qty 1

## 2014-09-13 MED ORDER — SODIUM CHLORIDE 0.9 % IR SOLN
Status: DC | PRN
  Administered 2014-09-13: 1000 mL

## 2014-09-13 MED ORDER — MIDAZOLAM HCL 5 MG/5ML IJ SOLN
INTRAMUSCULAR | Status: DC | PRN
  Administered 2014-09-13: 2 mg via INTRAVENOUS

## 2014-09-13 MED ORDER — SODIUM CHLORIDE 0.9 % IJ SOLN
INTRAMUSCULAR | Status: DC | PRN
  Administered 2014-09-13: 30 mL via INTRAVENOUS

## 2014-09-13 MED ORDER — DEXAMETHASONE SODIUM PHOSPHATE 10 MG/ML IJ SOLN
INTRAMUSCULAR | Status: DC | PRN
  Administered 2014-09-13: 10 mg via INTRAVENOUS

## 2014-09-13 MED ORDER — POLYETHYLENE GLYCOL 3350 17 G PO PACK
17.0000 g | PACK | Freq: Two times a day (BID) | ORAL | Status: DC
  Administered 2014-09-14 – 2014-09-17 (×6): 17 g via ORAL
  Filled 2014-09-13 (×5): qty 1

## 2014-09-13 MED ORDER — ALLOPURINOL 100 MG PO TABS
100.0000 mg | ORAL_TABLET | Freq: Every day | ORAL | Status: DC
  Administered 2014-09-14 – 2014-09-17 (×4): 100 mg via ORAL
  Filled 2014-09-13 (×4): qty 1

## 2014-09-13 MED ORDER — BUPIVACAINE-EPINEPHRINE (PF) 0.25% -1:200000 IJ SOLN
INTRAMUSCULAR | Status: AC
  Filled 2014-09-13: qty 30

## 2014-09-13 SURGICAL SUPPLY — 55 items
ADH SKN CLS APL DERMABOND .7 (GAUZE/BANDAGES/DRESSINGS) ×1
BAG SPEC THK2 15X12 ZIP CLS (MISCELLANEOUS)
BAG ZIPLOCK 12X15 (MISCELLANEOUS) IMPLANT
BANDAGE ELASTIC 6 VELCRO ST LF (GAUZE/BANDAGES/DRESSINGS) ×2 IMPLANT
BANDAGE ESMARK 6X9 LF (GAUZE/BANDAGES/DRESSINGS) ×1 IMPLANT
BLADE SAW SGTL 13.0X1.19X90.0M (BLADE) ×2 IMPLANT
BNDG CMPR 9X6 STRL LF SNTH (GAUZE/BANDAGES/DRESSINGS) ×1
BNDG ESMARK 6X9 LF (GAUZE/BANDAGES/DRESSINGS) ×2
BOWL SMART MIX CTS (DISPOSABLE) ×2 IMPLANT
CAP KNEE TOTAL 3 SIGMA ×1 IMPLANT
CEMENT HV SMART SET (Cement) ×2 IMPLANT
CUFF TOURN SGL QUICK 34 (TOURNIQUET CUFF) ×2
CUFF TRNQT CYL 34X4X40X1 (TOURNIQUET CUFF) ×1 IMPLANT
DECANTER SPIKE VIAL GLASS SM (MISCELLANEOUS) ×2 IMPLANT
DERMABOND ADVANCED (GAUZE/BANDAGES/DRESSINGS) ×1
DERMABOND ADVANCED .7 DNX12 (GAUZE/BANDAGES/DRESSINGS) ×1 IMPLANT
DRAPE EXTREMITY T 121X128X90 (DRAPE) ×2 IMPLANT
DRAPE POUCH INSTRU U-SHP 10X18 (DRAPES) ×2 IMPLANT
DRAPE U-SHAPE 47X51 STRL (DRAPES) ×2 IMPLANT
DRSG AQUACEL AG ADV 3.5X10 (GAUZE/BANDAGES/DRESSINGS) ×2 IMPLANT
DURAPREP 26ML APPLICATOR (WOUND CARE) ×4 IMPLANT
ELECT REM PT RETURN 9FT ADLT (ELECTROSURGICAL) ×2
ELECTRODE REM PT RTRN 9FT ADLT (ELECTROSURGICAL) ×1 IMPLANT
FACESHIELD WRAPAROUND (MASK) ×10 IMPLANT
FACESHIELD WRAPAROUND OR TEAM (MASK) ×5 IMPLANT
GLOVE BIOGEL PI IND STRL 7.5 (GLOVE) ×1 IMPLANT
GLOVE BIOGEL PI IND STRL 8.5 (GLOVE) ×1 IMPLANT
GLOVE BIOGEL PI INDICATOR 7.5 (GLOVE) ×1
GLOVE BIOGEL PI INDICATOR 8.5 (GLOVE) ×1
GLOVE ECLIPSE 8.0 STRL XLNG CF (GLOVE) ×2 IMPLANT
GLOVE ORTHO TXT STRL SZ7.5 (GLOVE) ×4 IMPLANT
GOWN SPEC L3 XXLG W/TWL (GOWN DISPOSABLE) ×2 IMPLANT
GOWN STRL REUS W/TWL LRG LVL3 (GOWN DISPOSABLE) ×2 IMPLANT
HANDPIECE INTERPULSE COAX TIP (DISPOSABLE) ×2
KIT BASIN OR (CUSTOM PROCEDURE TRAY) ×2 IMPLANT
LIQUID BAND (GAUZE/BANDAGES/DRESSINGS) ×2 IMPLANT
MANIFOLD NEPTUNE II (INSTRUMENTS) ×2 IMPLANT
NDL SAFETY ECLIPSE 18X1.5 (NEEDLE) ×2 IMPLANT
NEEDLE HYPO 18GX1.5 SHARP (NEEDLE) ×4
PACK TOTAL JOINT (CUSTOM PROCEDURE TRAY) ×2 IMPLANT
POSITIONER SURGICAL ARM (MISCELLANEOUS) ×2 IMPLANT
SET HNDPC FAN SPRY TIP SCT (DISPOSABLE) ×1 IMPLANT
SET PAD KNEE POSITIONER (MISCELLANEOUS) ×2 IMPLANT
SUCTION FRAZIER 12FR DISP (SUCTIONS) ×2 IMPLANT
SUT MNCRL AB 4-0 PS2 18 (SUTURE) ×2 IMPLANT
SUT VIC AB 1 CT1 36 (SUTURE) ×2 IMPLANT
SUT VIC AB 2-0 CT1 27 (SUTURE) ×6
SUT VIC AB 2-0 CT1 TAPERPNT 27 (SUTURE) ×3 IMPLANT
SUT VLOC 180 0 24IN GS25 (SUTURE) ×2 IMPLANT
SYR 50ML LL SCALE MARK (SYRINGE) ×4 IMPLANT
TOWEL OR 17X26 10 PK STRL BLUE (TOWEL DISPOSABLE) ×2 IMPLANT
TOWEL OR NON WOVEN STRL DISP B (DISPOSABLE) IMPLANT
TRAY FOLEY CATH 16FRSI W/METER (SET/KITS/TRAYS/PACK) ×1 IMPLANT
WATER STERILE IRR 1500ML POUR (IV SOLUTION) ×2 IMPLANT
WRAP KNEE MAXI GEL POST OP (GAUZE/BANDAGES/DRESSINGS) ×2 IMPLANT

## 2014-09-13 NOTE — Anesthesia Preprocedure Evaluation (Signed)
Anesthesia Evaluation  Patient identified by MRN, date of birth, ID band Patient awake    Reviewed: Allergy & Precautions, NPO status , Patient's Chart, lab work & pertinent test results  Airway Mallampati: II  TM Distance: >3 FB Neck ROM: Full    Dental  (+) Edentulous Upper, Edentulous Lower   Pulmonary neg shortness of breath, neg sleep apnea, neg COPDneg recent URI, former smoker,  breath sounds clear to auscultation        Cardiovascular hypertension, Pt. on medications - angina- Past MI and - CHF - dysrhythmias - Valvular Problems/MurmursRhythm:Regular     Neuro/Psych negative neurological ROS  negative psych ROS   GI/Hepatic Neg liver ROS, GERD-  Medicated and Controlled,  Endo/Other  Morbid obesity  Renal/GU Renal InsufficiencyRenal disease     Musculoskeletal  (+) Arthritis -, Osteoarthritis,    Abdominal   Peds  Hematology   Anesthesia Other Findings   Reproductive/Obstetrics                             Anesthesia Physical Anesthesia Plan  ASA: III  Anesthesia Plan: Spinal   Post-op Pain Management:    Induction: Intravenous  Airway Management Planned: Natural Airway and Nasal Cannula  Additional Equipment: None  Intra-op Plan:   Post-operative Plan:   Informed Consent: I have reviewed the patients History and Physical, chart, labs and discussed the procedure including the risks, benefits and alternatives for the proposed anesthesia with the patient or authorized representative who has indicated his/her understanding and acceptance.     Plan Discussed with: CRNA, Anesthesiologist and Surgeon  Anesthesia Plan Comments:         Anesthesia Quick Evaluation

## 2014-09-13 NOTE — Evaluation (Signed)
Physical Therapy Evaluation Patient Details Name: Joshua [name] MRN: [id number] DOB: [birth year] Today's Date: 09/13/2014   History of Present Illness  79 yo male s/p L TKA 09/13/14.   Clinical Impression  On eval, pt required Min assist for mobility-able to ambulate ~45 feet with RW. Pain increased to 7/10 with ambulation. Assisted pt back to bed. Discussed d/c plan-pt is unsure at the moment. Will continue to assess progress.     Follow Up Recommendations Home health PT;Supervision/Assistance - 24 hour;SNF (depending on progress)    Equipment Recommendations  Rolling walker with 5" wheels    Recommendations for Other Services       Precautions / Restrictions Precautions Precautions: Fall;Knee Restrictions Weight Bearing Restrictions: No LLE Weight Bearing: Weight bearing as tolerated      Mobility  Bed Mobility Overal bed mobility: Needs Assistance Bed Mobility: Supine to Sit;Sit to Supine     Supine to sit: Min assist Sit to supine: Min assist   General bed mobility comments: assist for L LE  Transfers Overall transfer level: Needs assistance Equipment used: Rolling walker (2 wheeled) Transfers: Sit to/from Stand Sit to Stand: Min assist;From elevated surface         General transfer comment: Assist to rise, stabilize, control descent. VCS safety, technique, hand placement  Ambulation/Gait Ambulation/Gait assistance: Min assist Ambulation Distance (Feet): 45 Feet Assistive device: Rolling walker (2 wheeled) Gait Pattern/deviations: Step-to pattern;Antalgic     General Gait Details: VCs safety, technique, sequence. Pain increased from 4/10 to 7/10 with ambulation.   Stairs            Wheelchair Mobility    Modified Rankin (Stroke Patients Only)       Balance                                             Pertinent Vitals/Pain Pain Assessment: 0-10 Pain Score: 7  Pain Location: L knee Pain Descriptors / Indicators:  Aching;Sore Pain Intervention(s): Limited activity within patient's tolerance;Repositioned;Ice applied    Home Living Family/patient expects to be discharged to:: Unsure Living Arrangements: Spouse/significant other Available Help at Discharge: Family Type of Home: House       Home Layout: Two level;Bed/bath upstairs Home Equipment: None      Prior Function Level of Independence: Independent               Hand Dominance        Extremity/Trunk Assessment   Upper Extremity Assessment: Overall WFL for tasks assessed           Lower Extremity Assessment: LLE deficits/detail   LLE Deficits / Details: hip flex 3/5, moves ankle well  Cervical / Trunk Assessment: Normal  Communication   Communication: No difficulties  Cognition Arousal/Alertness: Awake/alert Behavior During Therapy: WFL for tasks assessed/performed Overall Cognitive Status: Within Functional Limits for tasks assessed                      General Comments      Exercises        Assessment/Plan    PT Assessment Patient needs continued PT services  PT Diagnosis Difficulty walking;Acute pain   PT Problem List Decreased strength;Decreased range of motion;Decreased activity tolerance;Decreased balance;Decreased mobility;Decreased knowledge of use of DME;Pain  PT Treatment Interventions DME instruction;Gait training;Stair training;Functional mobility training;Therapeutic activities;Therapeutic exercise;Patient/family education;Balance training   PT Goals (Current  goals can be found in the Care Plan section) Acute Rehab PT Goals Patient Stated Goal: home if possible. regain independence PT Goal Formulation: With patient Time For Goal Achievement: 09/20/14 Potential to Achieve Goals: Good    Frequency 7X/week   Barriers to discharge        Co-evaluation               End of Session Equipment Utilized During Treatment: Gait belt Activity Tolerance: Patient limited by  pain Patient left: in bed;with call bell/phone within reach           Time: 8934-0684 PT Time Calculation (min) (ACUTE ONLY): 16 min   Charges:   PT Evaluation $Initial PT Evaluation Tier I: 1 Procedure PT Treatments $Gait Training: 8-22 mins   PT G Codes:        Weston Anna, MPT Pager: [phone]

## 2014-09-13 NOTE — Anesthesia Procedure Notes (Signed)
Spinal Patient location during procedure: OR Staffing Anesthesiologist: MOSER, CHRIS Preanesthetic Checklist Completed: patient identified, surgical consent, pre-op evaluation, timeout performed, IV checked, risks and benefits discussed and monitors and equipment checked Spinal Block Patient position: sitting Prep: site prepped and draped and DuraPrep Patient monitoring: heart rate, cardiac monitor, continuous pulse ox and blood pressure Approach: midline Location: L3-4 Injection technique: single-shot Needle Needle type: Pencan  Needle gauge: 24 G Needle length: 10 cm Assessment Sensory level: T8

## 2014-09-13 NOTE — Transfer of Care (Signed)
Immediate Anesthesia Transfer of Care Note  Patient: Joshua [name]  Procedure(s) Performed: Procedure(s): LEFT TOTAL KNEE ARTHROPLASTY (Left)  Patient Location: PACU  Anesthesia Type:Spinal  Level of Consciousness: sedated  Airway & Oxygen Therapy: Patient Spontanous Breathing and Patient connected to face mask oxygen  Post-op Assessment: Report given to PACU RN and Post -op Vital signs reviewed and stable  Post vital signs: Reviewed and stable  Complications: No apparent anesthesia complications

## 2014-09-13 NOTE — Progress Notes (Signed)
Clinical Social Work Department BRIEF PSYCHOSOCIAL ASSESSMENT 09/13/2014  Patient:  Joshua [name], Joshua [name]     Account Number:  [id number]     Admit date:  09/13/2014  Clinical Social Worker:  Lacie Scotts  Date/Time:  09/13/2014 04:46 PM  Referred by:  Physician  Date Referred:  09/13/2014 Referred for  SNF Placement   Other Referral:   Interview type:  Patient Other interview type:    PSYCHOSOCIAL DATA Living Status:  WIFE Admitted from facility:   Level of care:   Primary support name:  [name] Primary support relationship to patient:  SPOUSE Degree of support available:   supportive    CURRENT CONCERNS Current Concerns  Post-Acute Placement   Other Concerns:    SOCIAL WORK ASSESSMENT / PLAN Pt is a 79 yr old gentleman living at home prior to hospitalization. CSW met with pt to assist with d/c planning. This is a planned admission. Pt may require ST Rehab following hospital d/c. PT recommendations are pending. Pt would like to have  rehab at [facility], if rehab is needed. SNF has been contacted and bed offer received. CSW will continue to follow to assist with SNF placement, if needed.   Assessment/plan status:  Psychosocial Support/Ongoing Assessment of Needs Other assessment/ plan:   Information/referral to community resources:   None needed at this time.    PATIENT'S/FAMILY'S RESPONSE TO PLAN OF CARE: Pt is hoping to return home following hospitalization but will consider ST Rehab if recommended by MD / PT. Pt is motivated to work with therapy. D/C planning is ongoing.    Werner Lean LCSW [phone]

## 2014-09-13 NOTE — Progress Notes (Signed)
Utilization review completed.  

## 2014-09-13 NOTE — Op Note (Signed)
NAME:  Joshua [name]                      MEDICAL RECORD NO.:  [id number]                             FACILITY:  [facility]      PHYSICIAN:  Pietro Cassis. Alvan Dame, M.D.  DATE OF BIRTH:  [birth year]      DATE OF PROCEDURE:  09/13/2014                                     OPERATIVE REPORT         PREOPERATIVE DIAGNOSIS:  Left knee osteoarthritis.      POSTOPERATIVE DIAGNOSIS:  Left knee osteoarthritis.      FINDINGS:  The patient was noted to have complete loss of cartilage and   bone-on-bone arthritis with associated osteophytes in the medial and patellofemoral compartments of   the knee with a significant synovitis and associated effusion.      PROCEDURE:  Left total knee replacement.      COMPONENTS USED:  [employer] rotating platform posterior stabilized knee   system, a size 4 femur, 4 tibia, 12.5 mm PS insert, and 41 patellar   button.      SURGEON:  Pietro Cassis. Alvan Dame, M.D.      ASSISTANT:  Danae Orleans, PA-C.      ANESTHESIA:  Spinal.      SPECIMENS:  None.      COMPLICATION:  None.      DRAINS:  None.  EBL: <100cc      TOURNIQUET TIME:   Total Tourniquet Time Documented: Thigh (Left) - 35 minutes Total: Thigh (Left) - 35 minutes  .      The patient was stable to the recovery room.      INDICATION FOR PROCEDURE:  Joshua [name] is a 79 y.o. male patient of   mine.  The patient had been seen, evaluated, and treated conservatively in the   office with medication, activity modification, and injections.  The patient had   radiographic changes of bone-on-bone arthritis with endplate sclerosis and osteophytes noted.      The patient failed conservative measures including medication, injections, and activity modification, and at this point was ready for more definitive measures.   Based on the radiographic changes and failed conservative measures, the patient   decided to proceed with total knee replacement.  Risks of infection,   DVT, component failure, need for revision  surgery, postop course, and   expectations were all   discussed and reviewed.  Consent was obtained for benefit of pain   relief.      PROCEDURE IN DETAIL:  The patient was brought to the operative theater.   Once adequate anesthesia, preoperative antibiotics, 2 gm of Ancef, 1gm of Tranexamic Acid, 37m of Decadron administered, the patient was positioned supine with the left thigh tourniquet placed.  The  left lower extremity was prepped and draped in sterile fashion.  A time-   out was performed identifying the patient, planned procedure, and   extremity.      The left lower extremity was placed in the D[facility] LLCleg holder.  The leg was   exsanguinated, tourniquet elevated to 250 mmHg.  A midline incision was   made followed by median parapatellar arthrotomy.  Following initial   exposure, attention was first directed to the patella.  Precut   measurement was noted to be 24 mm.  I resected down to 14-15 mm and used a   41 patellar button to restore patellar height as well as cover the cut   surface.      The lug holes were drilled and a metal shim was placed to protect the   patella from retractors and saw blades.      At this point, attention was now directed to the femur.  The femoral   canal was opened with a drill, irrigated to try to prevent fat emboli.  An   intramedullary rod was passed at 5 degrees valgus, 10 mm of bone was   resected off the distal femur.  Following this resection, the tibia was   subluxated anteriorly.  Using the extramedullary guide, 2 mm of bone was resected off   the proximal medial tibia.  We confirmed the gap would be   stable medially and laterally with a 10 mm insert as well as confirmed   the cut was perpendicular in the coronal plane, checking with an alignment rod.      Once this was done, I sized the femur to be a size 4 in the anterior-   posterior dimension, chose a standard component based on medial and   lateral dimension.  The size 4 rotation  block was then pinned in   position anterior referenced using the C-clamp to set rotation.  The   anterior, posterior, and  chamfer cuts were made without difficulty nor   notching making certain that I was along the anterior cortex to help   with flexion gap stability.      The final box cut was made off the lateral aspect of distal femur.      At this point, the tibia was sized to be a size 4, the size 4 tray was   then pinned in position through the medial third of the tubercle,   drilled, and keel punched.  Trial reduction was now carried with a 4 femur,  4 tibia, a 12.5 mm insert, and the 41 patella botton.  The knee was brought to   extension, full extension with good flexion stability with the patella   tracking through the trochlea without application of pressure.  Given   all these findings, the trial components removed.  Final components were   opened and cement was mixed.  The knee was irrigated with normal saline   solution and pulse lavage.  The synovial lining was   then injected with 30cc of 0.25% Marcaine with epinephrine, 1 cc of Toradol plus 30cc of NS for a   total of 61 cc.      The knee was irrigated.  Final implants were then cemented onto clean and   dried cut surfaces of bone with the knee brought to extension with a 12.5 mm trial insert.      Once the cement had fully cured, the excess cement was removed   throughout the knee.  I confirmed I was satisfied with the range of   motion and stability, and the final 12.5 mm PS insert was chosen.  It was   placed into the knee.      The tourniquet had been let down at 35 minutes.  No significant   hemostasis required.  The   extensor mechanism was then reapproximated using #1 Vicryl and #0 V-lock with  the knee   in flexion.  The   remaining wound was closed with 2-0 Vicryl and running 4-0 Monocryl.   The knee was cleaned, dried, dressed sterilely using Dermabond and   Aquacel dressing.  The patient was then    brought to recovery room in stable condition, tolerating the procedure   well.   Please note that Physician Assistant, Danae Orleans, was present for the entirety of the case, and was utilized for pre-operative positioning, peri-operative retractor management, general facilitation of the procedure.  He was also utilized for primary wound closure at the end of the case.              Pietro Cassis Alvan Dame, M.D.    09/13/2014 11:20 AM

## 2014-09-13 NOTE — Progress Notes (Signed)
Clinical Social Work Department CLINICAL SOCIAL WORK PLACEMENT NOTE 09/13/2014  Patient:  Joshua [name], Joshua [name]  Account Number:  [id number] Admit date:  09/13/2014  Clinical Social Worker:  Werner Lean, LCSW  Date/time:  09/13/2014 04:54 PM  Clinical Social Work is seeking post-discharge placement for this patient at the following level of care:   SKILLED NURSING   (*CSW will update this form in Epic as items are completed)     Patient/family provided with [facility] Department of Clinical Social Work's list of facilities offering this level of care within the geographic area requested by the patient (or if unable, by the patient's family).  09/13/2014  Patient/family informed of their freedom to choose among providers that offer the needed level of care, that participate in Medicare, Medicaid or managed care program needed by the patient, have an available bed and are willing to accept the patient.    Patient/family informed of [facility]' ownership interest in [facility], as well as of the fact that they are under no obligation to receive care at this facility.  PASARR submitted to EDS on 09/13/2014 PASARR number received on 09/13/2014  FL2 transmitted to all facilities in geographic area requested by pt/family on  09/13/2014 FL2 transmitted to all facilities within larger geographic area on   Patient informed that his/her managed care company has contracts with or will negotiate with  certain facilities, including the following:     Patient/family informed of bed offers received:  09/13/2014 Patient chooses bed at [facility]ician recommends and patient chooses bed at    Patient to be transferred to  on   Patient to be transferred to facility by  Patient and family notified of transfer on  Name of family member notified:    The following physician request were entered in Epic:   Additional Comments:  Werner Lean LCSW [phone]

## 2014-09-13 NOTE — Interval H&P Note (Signed)
History and Physical Interval Note:  09/13/2014 8:58 AM  Joshua [name]  has presented today for surgery, with the diagnosis of LEFT KNEE OA  The various methods of treatment have been discussed with the patient and family. After consideration of risks, benefits and other options for treatment, the patient has consented to  Procedure(s): LEFT TOTAL KNEE ARTHROPLASTY (Left) as a surgical intervention .  The patient's history has been reviewed, patient examined, no change in status, stable for surgery.  I have reviewed the patient's chart and labs.  Questions were answered to the patient's satisfaction.     Mauri Pole

## 2014-09-13 NOTE — Anesthesia Postprocedure Evaluation (Signed)
  Anesthesia Post-op Note  Patient: Joshua [name]  Procedure(s) Performed: Procedure(s): LEFT TOTAL KNEE ARTHROPLASTY (Left)  Patient Location: PACU  Anesthesia Type:Spinal  Level of Consciousness: awake  Airway and Oxygen Therapy: Patient Spontanous Breathing  Post-op Pain: mild  Post-op Assessment: Post-op Vital signs reviewed, Patient's Cardiovascular Status Stable, Respiratory Function Stable, Patent Airway, No signs of Nausea or vomiting and Pain level controlled  Post-op Vital Signs: Reviewed and stable  Last Vitals:  Filed Vitals:   09/13/14 2220  BP: 128/78  Pulse: 81  Temp: 36.9 C  Resp: 18    Complications: No apparent anesthesia complications

## 2014-09-14 LAB — CBC
HCT: 44.1 % (ref 39.0–52.0)
Hemoglobin: 14.1 g/dL (ref 13.0–17.0)
MCH: 28.7 pg (ref 26.0–34.0)
MCHC: 32 g/dL (ref 30.0–36.0)
MCV: 89.8 fL (ref 78.0–100.0)
Platelets: 143 10*3/uL — ABNORMAL LOW (ref 150–400)
RBC: 4.91 MIL/uL (ref 4.22–5.81)
RDW: 15.5 % (ref 11.5–15.5)
WBC: 10.4 10*3/uL (ref 4.0–10.5)

## 2014-09-14 LAB — BASIC METABOLIC PANEL
Anion gap: 8 (ref 5–15)
BUN: 16 mg/dL (ref 6–23)
CO2: 25 mmol/L (ref 19–32)
Calcium: 8.7 mg/dL (ref 8.4–10.5)
Chloride: 101 mmol/L (ref 96–112)
Creatinine, Ser: 0.97 mg/dL (ref 0.50–1.35)
GFR calc Af Amer: >90 mL/min (ref >90)
GFR calc non Af Amer: 78 mL/min — ABNORMAL LOW (ref >90)
Glucose, Bld: 142 mg/dL — ABNORMAL HIGH (ref 70–99)
Potassium: 4.8 mmol/L (ref 3.5–5.1)
Sodium: 134 mmol/L — ABNORMAL LOW (ref 135–145)

## 2014-09-14 NOTE — Progress Notes (Signed)
Physical Therapy Treatment Patient Details Name: CLANCE BAQUERO MRN: [id number] DOB: Nov 17, 1935 Today's Date: 10-01-2014    History of Present Illness 79 yo male s/p L TKA 09/13/14.     PT Comments    Pt to practice stairs in pm; progressing well, pt/wife still concerned about D/C to home  Follow Up Recommendations  Home health PT;Supervision/Assistance - 24 hour;SNF (vs SNF)     Equipment Recommendations  Rolling walker with 5" wheels    Recommendations for Other Services       Precautions / Restrictions Precautions Precautions: Fall;Knee Restrictions Weight Bearing Restrictions: No LLE Weight Bearing: Weight bearing as tolerated    Mobility  Bed Mobility Overal bed mobility: Needs Assistance Bed Mobility: Sit to Supine       Sit to supine: Min guard   General bed mobility comments: pt able to bring LLE off bed  Transfers Overall transfer level: Needs assistance Equipment used: Rolling walker (2 wheeled) Transfers: Sit to/from Stand Sit to Stand: Min guard;Min assist         General transfer comment: cues for UE/LE placement  Ambulation/Gait Ambulation/Gait assistance: Min guard;Supervision Ambulation Distance (Feet): 120 Feet Assistive device: Rolling walker (2 wheeled) Gait Pattern/deviations: Step-to pattern;Antalgic     General Gait Details: VCs safety, technique, sequence.    Stairs            Wheelchair Mobility    Modified Rankin (Stroke Patients Only)       Balance                                    Cognition Arousal/Alertness: Awake/alert Behavior During Therapy: WFL for tasks assessed/performed Overall Cognitive Status: Within Functional Limits for tasks assessed                      Exercises Total Joint Exercises Ankle Circles/Pumps: AROM;Both;10 reps Heel Slides: AROM;AAROM;Left;10 reps    General Comments        Pertinent Vitals/Pain Pain Assessment: 0-10 Pain Score: 4  Pain Location:  L knee Pain Descriptors / Indicators: Sore Pain Intervention(s): Limited activity within patient's tolerance;Monitored during session;Ice applied;Repositioned;Premedicated before session    Home Living                      Prior Function            PT Goals (current goals can now be found in the care plan section) Acute Rehab PT Goals Patient Stated Goal: 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 PT Goal Formulation: With patient Time For Goal Achievement: 09/20/14 Potential to Achieve Goals: Good Progress towards PT goals: Progressing toward goals    Frequency  7X/week    PT Plan Current plan remains appropriate    Co-evaluation             End of Session Equipment Utilized During Treatment: Gait belt Activity Tolerance: Patient tolerated treatment well Patient left: in bed;with call bell/phone within reach;with family/visitor present     Time: 2481-8590 PT Time Calculation (min) (ACUTE ONLY): 28 min  Charges:  $Gait Training: 23-37 mins                    G Codes:      WILLIAMS,TARA 10/01/2014, 12:21 PM

## 2014-09-14 NOTE — Progress Notes (Signed)
Physical Therapy Treatment Patient Details Name: FINNBAR CEDILLOS MRN: [id number] DOB: 03-04-1936 Today's Date: 09/14/2014   SATURATION QUALIFICATIONS: (This note is used to comply with regulatory documentation for home oxygen)  Patient Saturations on Room Air at Rest = 94%  Patient Saturations on Room Air while Ambulating = 84%  Patient Saturations on 2 Liters of oxygen while Ambulating = 91%   History of Present Illness 79 yo male s/p L TKA 09/13/14.     PT Comments    Progressing with mobility. Some concern about pt being able to mobilize safely in home alone when wife is not present. Overall, pt is Min guard assist for mobility. Discussed d/c plan at length-feel pt could benefit from short rehab stay. If rehab is not an option, then recommend  HHPT and int supervision. Pt states he plans to remain on 2nd level mostly. Explained that he will need assistance/supervision when ascending/descending stairs and possibly when bathing/dressing.   Follow Up Recommendations  SNF(short stay at [facility] possible, to maximize independence and safety with mobility. Otherwise, HHPT with intermittent supervision)     Equipment Recommendations  Rolling walker with 5" wheels    Recommendations for Other Services       Precautions / Restrictions Precautions Precautions: Fall;Knee Restrictions Weight Bearing Restrictions: No LLE Weight Bearing: Weight bearing as tolerated    Mobility  Bed Mobility Overal bed mobility: Needs Assistance Bed Mobility: Supine to Sit;Sit to Supine     Supine to sit: HOB elevated;Supervision Sit to supine: Supervision   General bed mobility comments: pt able to bring LLE off bed  Transfers Overall transfer level: Needs assistance Equipment used: Rolling walker (2 wheeled) Transfers: Sit to/from Stand Sit to Stand: From elevated surface;Min guard         General transfer comment: close guard for safety. VCs safety, hand  placement  Ambulation/Gait Ambulation/Gait assistance: Min guard Ambulation Distance (Feet): 125 Feet Assistive device: Rolling walker (2 wheeled) Gait Pattern/deviations: Step-to pattern;Antalgic;Trunk flexed     General Gait Details: close guard for safety.    Stairs Stairs: Yes Number of stairs: 5 (up and over portable steps x 2) Stairs assistance: Min guard Stair Management: Step to pattern;One rail Right;Forwards   General stair comments: Pt used both hands on R rail for support. VCs safety, sequence. close guard for safety.   Wheelchair Mobility    Modified Rankin (Stroke Patients Only)       Balance                                    Cognition Arousal/Alertness: Awake/alert Behavior During Therapy: WFL for tasks assessed/performed Overall Cognitive Status: Within Functional Limits for tasks assessed                      Exercises Total Joint Exercises Ankle Circles/Pumps: AROM;Both;10 reps;Supine Quad Sets: AROM;Both;10 reps;Supine Heel Slides: AROM;AAROM;Left;10 reps Hip ABduction/ADduction: AAROM;Left;10 reps;Supine Straight Leg Raises: AAROM;Left;10 reps;Supine    General Comments        Pertinent Vitals/Pain Pain Assessment: 0-10 Pain Score: 5  Pain Location: L knee Pain Descriptors / Indicators: Aching;Sore Pain Intervention(s): Monitored during session;Ice applied;Repositioned    Home Living                      Prior Function            PT Goals (current goals can now be  found in the care plan section) Acute Rehab PT Goals Patient Stated Goal: 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 PT Goal Formulation: With patient Time For Goal Achievement: 09/20/14 Potential to Achieve Goals: Good Progress towards PT goals: Progressing toward goals    Frequency  7X/week    PT Plan Current plan remains appropriate    Co-evaluation             End of Session Equipment Utilized During Treatment:  Gait belt Activity Tolerance: Patient tolerated treatment well Patient left: in bed;with call bell/phone within reach     Time: 1401-1445 PT Time Calculation (min) (ACUTE ONLY): 44 min  Charges:  $Gait Training: 8-22 mins $Therapeutic Exercise: 8-22 mins $Self Care/Home Management: 8-22                    G Codes:      Weston Anna, MPT Pager: [phone]

## 2014-09-14 NOTE — Evaluation (Signed)
Occupational Therapy Evaluation Patient Details Name: Joshua [name] MRN: [id number] DOB: 22-Jan-1936 Today's Date: 09/14/2014    History of Present Illness 79 yo male s/p L TKA 09/13/14.    Clinical Impression   Pt was admitted for the above surgery.   He was independent with adls prior to admission and he needs occasional min A at this time, and wife will assist him.  He is not interested in AE.  Will follow in acute to further educate on bathroom transfers.  Goals are set at supervision level.    Follow Up Recommendations  No OT follow up    Equipment Recommendations  3 in 1 bedside comode    Recommendations for Other Services       Precautions / Restrictions Precautions Precautions: Fall;Knee Restrictions LLE Weight Bearing: Weight bearing as tolerated      Mobility Bed Mobility         Supine to sit: Supervision;HOB elevated     General bed mobility comments: pt able to bring LLE off bed  Transfers   Equipment used: Rolling walker (2 wheeled) Transfers: Sit to/from Stand Sit to Stand: Min guard;From elevated surface         General transfer comment: cues for UE/LE placement    Balance                                            ADL Overall ADL's : Needs assistance/impaired     Grooming: Oral care;Supervision/safety;Standing                   Toilet Transfer: Min guard;Ambulation (to chair)             General ADL Comments: pt is able to perform UB adls with set up and min guard for LB bathing/minA for LB dressing. He has a long sponge at home.  Wife will assist with sock as needed     Vision                     Perception     Praxis      Pertinent Vitals/Pain Pain Score:  (2 sitting; 5 standing) Pain Location: L knee Pain Descriptors / Indicators: Aching Pain Intervention(s): Limited activity within patient's tolerance;Monitored during session;Premedicated before session;Repositioned     Hand  Dominance     Extremity/Trunk Assessment Upper Extremity Assessment Upper Extremity Assessment: Overall WFL for tasks assessed           Communication Communication Communication: No difficulties   Cognition Arousal/Alertness: Awake/alert Behavior During Therapy: WFL for tasks assessed/performed Overall Cognitive Status: Within Functional Limits for tasks assessed                     General Comments       Exercises       Shoulder Instructions      Home Living Family/patient expects to be discharged to:: Unsure Living Arrangements: Spouse/significant other                               Additional Comments: wife works during the day; walk in shower; standard toilet; no bathroom DME      Prior Functioning/Environment Level of Independence: Independent             OT Diagnosis: Generalized weakness  OT Problem List: Decreased strength;Decreased activity tolerance;Decreased knowledge of use of DME or AE;Pain   OT Treatment/Interventions: Self-care/ADL training;DME and/or AE instruction;Patient/family education    OT Goals(Current goals can be found in the care plan section) Acute Rehab OT Goals Patient 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: 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 OT Goal Formulation: With patient Time For Goal Achievement: 09/21/14 Potential to Achieve Goals: Good ADL Goals Pt Will Transfer to Toilet: with supervision;ambulating;bedside commode Pt Will Perform Tub/Shower Transfer: with supervision;Shower transfer;ambulating;3 in 1  OT Frequency: Min 2X/week   Barriers to D/C:            Co-evaluation              End of Session    Activity Tolerance: Patient tolerated treatment well Patient left: in chair;with call bell/phone within reach   Time: 0817-0840 OT Time Calculation (min): 23 min Charges:  OT General Charges $OT Visit: 1 Procedure OT Evaluation $Initial OT Evaluation Tier I: 1 Procedure OT Treatments $Self  Care/Home Management : 8-22 mins G-Codes:    SPENCER,MARYELLEN 09/24/14, 8:56 AM  Lesle Chris, OTR/L [phone] 09/24/2014

## 2014-09-14 NOTE — Progress Notes (Signed)
     Subjective: 1 Day Post-Op Procedure(s) (LRB): LEFT TOTAL KNEE ARTHROPLASTY (Left)   Patient reports pain as mild, pain controlled. No events throughout the night. Debating between home and SNF. Patient and family nervous about him being able to do stairs.  Objective:   VITALS:   Filed Vitals:   09/14/14 0557  BP: 126/79  Pulse: 73  Temp: 97.7 F (36.5 C)  Resp: 16    Dorsiflexion/Plantar flexion intact Incision: dressing C/D/I No cellulitis present Compartment soft  LABS  Recent Labs  09/14/14 0512  HGB 14.1  HCT 44.1  WBC 10.4  PLT 143*     Recent Labs  09/14/14 0512  NA 134*  K 4.8  BUN 16  CREATININE 0.97  GLUCOSE 142*     Assessment/Plan: 1 Day Post-Op Procedure(s) (LRB): LEFT TOTAL KNEE ARTHROPLASTY (Left) Foley cath d/c'ed Advance diet Up with therapy D/C IV fluids Discharge home with home health/SNF   West Pugh. Babish   PAC  09/14/2014, 9:42 AM

## 2014-09-15 ENCOUNTER — Encounter (HOSPITAL_COMMUNITY): Payer: Self-pay | Admitting: Pulmonary Disease

## 2014-09-15 ENCOUNTER — Inpatient Hospital Stay (HOSPITAL_COMMUNITY): Payer: Medicare Other

## 2014-09-15 DIAGNOSIS — Z96652 Presence of left artificial knee joint: Secondary | ICD-10-CM

## 2014-09-15 DIAGNOSIS — I2699 Other pulmonary embolism without acute cor pulmonale: Secondary | ICD-10-CM

## 2014-09-15 LAB — CBC
HCT: 44.2 % (ref 39.0–52.0)
Hemoglobin: 14 g/dL (ref 13.0–17.0)
MCH: 28.6 pg (ref 26.0–34.0)
MCHC: 31.7 g/dL (ref 30.0–36.0)
MCV: 90.2 fL (ref 78.0–100.0)
Platelets: 163 10*3/uL (ref 150–400)
RBC: 4.9 MIL/uL (ref 4.22–5.81)
RDW: 15.4 % (ref 11.5–15.5)
WBC: 10.6 10*3/uL — ABNORMAL HIGH (ref 4.0–10.5)

## 2014-09-15 LAB — BASIC METABOLIC PANEL
Anion gap: 9 (ref 5–15)
BUN: 13 mg/dL (ref 6–23)
CO2: 28 mmol/L (ref 19–32)
Calcium: 9.2 mg/dL (ref 8.4–10.5)
Chloride: 101 mmol/L (ref 96–112)
Creatinine, Ser: 0.99 mg/dL (ref 0.50–1.35)
GFR calc Af Amer: 90 mL/min — ABNORMAL LOW (ref >90)
GFR calc non Af Amer: 78 mL/min — ABNORMAL LOW (ref >90)
Glucose, Bld: 110 mg/dL — ABNORMAL HIGH (ref 70–99)
Potassium: 5.7 mmol/L — ABNORMAL HIGH (ref 3.5–5.1)
Sodium: 138 mmol/L (ref 135–145)

## 2014-09-15 LAB — TROPONIN I: Troponin I: 0.03 ng/mL (ref <0.031)

## 2014-09-15 LAB — BRAIN NATRIURETIC PEPTIDE: B Natriuretic Peptide: 122.7 pg/mL — ABNORMAL HIGH (ref 0.0–100.0)

## 2014-09-15 MED ORDER — HEPARIN (PORCINE) IN NACL 100-0.45 UNIT/ML-% IJ SOLN
1700.0000 [IU]/h | INTRAMUSCULAR | Status: DC
  Administered 2014-09-15: 1700 [IU]/h via INTRAVENOUS
  Filled 2014-09-15: qty 250

## 2014-09-15 MED ORDER — HEPARIN BOLUS VIA INFUSION
3000.0000 [IU] | Freq: Once | INTRAVENOUS | Status: AC
  Administered 2014-09-15: 3000 [IU] via INTRAVENOUS
  Filled 2014-09-15: qty 3000

## 2014-09-15 MED ORDER — IOHEXOL 350 MG/ML SOLN
100.0000 mL | Freq: Once | INTRAVENOUS | Status: AC | PRN
  Administered 2014-09-15: 100 mL via INTRAVENOUS

## 2014-09-15 NOTE — Progress Notes (Signed)
[facility] will have a ST Rehab bed on FRI if pt is stable for d/c.  Werner Lean LCSW [phone]

## 2014-09-15 NOTE — Progress Notes (Signed)
ANTICOAGULATION CONSULT NOTE - Initial Consult  Pharmacy Consult for Heparin Indication: pulmonary embolus  Allergies  Allergen Reactions  . Other     Beer- Swelling   . Sunflower Oil Swelling  . Sulfa Antibiotics Rash   Patient Measurements: Height: _0  (190.5 cm) Weight: 271 lb (122.925 kg) IBW/kg (Calculated) : 84.5 Heparin Dosing Weight: 111kg  Vital Signs: Temp: 98.1 F (36.7 C) (01/28 1506) Temp Source: Oral (01/28 1506) BP: 114/60 mmHg (01/28 1506) Pulse Rate: 83 (01/28 1506)  Labs:  Recent Labs  09/14/14 0512 09/15/14 0522  HGB 14.1 14.0  HCT 44.1 44.2  PLT 143* 163  CREATININE 0.97 0.99   Estimated Creatinine Clearance: 89.7 mL/min (by C-G formula based on Cr of 0.99).  Medical History: Past Medical History  Diagnosis Date  . Hypertension   . Phlebitis     right arm  at least 20 years ago   . Pneumonia     hx of pneumonia as a child   . GERD (gastroesophageal reflux disease)   . Arthritis   . Folliculitis     posterior scalp per office visit note of Dr Maudie Mercury 07/20/2014     Medications:  Scheduled:  . allopurinol  100 mg Oral Daily  . colchicine  0.6 mg Oral Daily  . docusate sodium  100 mg Oral BID  . doxazosin  4 mg Oral QHS  . furosemide  20 mg Oral Q M,W,F  . heparin  3,000 Units Intravenous Once  . HYDROcodone-acetaminophen  1-2 tablet Oral 6 times per day  . pantoprazole  40 mg Oral Daily  . polyethylene glycol  17 g Oral BID   Infusions:  . heparin     Assessment: 12 yoM s/p L TKR 1/26, on ASA EC 365m bid from 1/27 for VTE prophylaxis. Complaint of mild chest pain POD 1, and worsening SHOB, chest CT with bilateral PE and RV dilatation. INR pre-surgically was wnl.  Begin IV Heparin for PE, pharmacy dosing requested  Will d/c ASA EC 3217morder  Goal of Therapy:  Heparin level 0.3-0.7 units/ml Monitor platelets by anticoagulation protocol: Yes   Plan:   Heparin 3000 unit bolus, infusion at 1700 units/hr  First Heparin level  at 0030 tomorrow 1/29  Daily CBC, plan daily Heparin level once levels in range   GrMinda DittoharmD Pager [phone]/28/2016, 6:39 PM

## 2014-09-15 NOTE — Progress Notes (Signed)
Physical Therapy Treatment Patient Details Name: Joshua [name] MRN: [id number] DOB: Feb 19, 1936 Today's Date: 2014-09-29    History of Present Illness 79 yo male s/p L TKA 09/13/14.     PT Comments    Pt limited by nausea this am, vomiting dark emesis--amb deferred, completed HEP; pt sats 82% on RA upon PT arrival,O2 replaced at 2.5L and sats 92%, pursed lip breathing x 5 reps; RN notified; will attempt to see again later; per SW plan is for HHPT; Pt will need intermittent supervision for stairs and likely ADLs as well at D/C  Follow Up Recommendations  SNF;Home health PT;Supervision - Intermittent     Equipment Recommendations  Rolling walker with 5" wheels    Recommendations for Other Services       Precautions / Restrictions Precautions Precautions: Fall;Knee Restrictions Weight Bearing Restrictions: No LLE Weight Bearing: Weight bearing as tolerated    Mobility  Bed Mobility                  Transfers                    Ambulation/Gait                 Stairs            Wheelchair Mobility    Modified Rankin (Stroke Patients Only)       Balance                                    Cognition Arousal/Alertness: Awake/alert Behavior During Therapy: WFL for tasks assessed/performed Overall Cognitive Status: Within Functional Limits for tasks assessed                      Exercises Total Joint Exercises Ankle Circles/Pumps: AROM;Both;10 reps;Supine Quad Sets: AROM;Both;10 reps;Supine Short Arc Quad: AROM;Strengthening;Left;10 reps Heel Slides: AROM;AAROM;Left;10 reps Hip ABduction/ADduction: AAROM;Left;10 reps;Supine Straight Leg Raises: AAROM;Left;10 reps;Supine    General Comments        Pertinent Vitals/Pain Pain Assessment: 0-10 Pain Score: 3  Pain Location: L knee Pain Descriptors / Indicators: Discomfort Pain Intervention(s): Limited activity within patient's tolerance;Monitored during  session;Ice applied    Home Living                      Prior Function            PT Goals (current goals can now be found in the care plan section) Acute Rehab PT Goals Patient 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

## 2014-09-15 NOTE — Care Management Note (Addendum)
    Page 1 of 1   09/16/2014     3:18:02 PM CARE MANAGEMENT NOTE 09/16/2014  Patient:  Joshua [name], Joshua [name]   Account Number:  [id number]  Date Initiated:  09/15/2014  Documentation initiated by:  [facility]  Subjective/Objective Assessment:   adm: LEFT TOTAL KNEE ARTHROPLASTY (Left)     Action/Plan:   discharge planning   Anticipated DC Date:  09/19/2014   Anticipated DC Plan:  [place]  CM consult      Choice offered to / List presented to:  C-1 Patient           Status of service:  In process, will continue to follow Medicare Important Message given?  YES (If response is "NO", the following Medicare IM given date fields will be blank) Date Medicare IM given:  09/16/2014 Medicare IM given by:   Date Additional Medicare IM given:   Additional Medicare IM given by:    Discharge Disposition:    Per UR Regulation:  Reviewed for med. necessity/level of care/duration of stay  If discussed at [place] of Stay Meetings, dates discussed:    Comments:  09/16/14 Dessa Phi RN BSN NCM [phone] Transferred from 41th floor-POD#3 [place], developed bilat PE-hep gtt.Per MD d/c plan is for home w/HH.[facility] agency following, rep tim aware & awaiting [facility] orders.Recommend HHPT,rw,3n1.Patient/spouse aware(initially some concerns about going home, preferring snf, but now understands the benefit of home,& MD recommendations).await final HHPT,rw,3n1 order.  09/15/14 12:30 Cm met with pt and Wife, [name] [phone], in room to discuss disposition.  Pt to go to SNF, [facility] at discharge; [facility] arranging.  No other CM needs were communicated.  Mariane Masters, BSN, [place].

## 2014-09-15 NOTE — Plan of Care (Signed)
Problem: Phase III Progression Outcomes Goal: Anticoagulant follow-up in place Outcome: Not Applicable Date Met:  68/59/92 ASA for Vte

## 2014-09-15 NOTE — Progress Notes (Signed)
Subjective: 2 Days Post-Op Procedure(s) (LRB): LEFT TOTAL KNEE ARTHROPLASTY (Left)   Patient reports pain as mild, in the knee. States that he is having more issues with his O2 stats dropping when he gets up.  He does state that he has had some nausea, but that he has no stomach pain, no distension of the stomach and having positive flatus.  He denies yet having a BM.  He did save some sputum that he says that he coughed up, but other reports as product of emesis.  Objective:   VITALS:   Filed Vitals:   09/15/14  BP: 123/69  Pulse: 81  Temp: 97.8 F (36.6 C)   Resp: 16    Dorsiflexion/Plantar flexion intact Incision: dressing C/D/I No cellulitis present Compartment soft  LABS  Recent Labs  09/14/14 0512 09/15/14 0522  HGB 14.1 14.0  HCT 44.1 44.2  WBC 10.4 10.6*  PLT 143* 163     Recent Labs  09/14/14 0512 09/15/14 0522  NA 134* 138  K 4.8 5.7*  BUN 16 13  CREATININE 0.97 0.99  GLUCOSE 142* 110*     Assessment/Plan: 2 Days Post-Op Procedure(s) (LRB): LEFT TOTAL KNEE ARTHROPLASTY (Left) ACE bandage is removed WiIl stop Iron and celebrex to help relieve any potential stomach upset. Do to decreasing O2 stats with activity and SOB, we will order a Chest CT. Up with therapy Discharge home with home health / SNF, eventually when ready.   Obese (BMI 30-39.9) Estimated body mass index is 33.87 kg/(m^2) as calculated from the following:   Height as of this encounter: 6' 3" (1.905 m).   Weight as of this encounter: 122.925 kg (271 lb). Patient also counseled that weight may inhibit the healing process Patient counseled that losing weight will help with future health issues         West Pugh. Babish   PAC  09/15/2014, 1:24 PM

## 2014-09-15 NOTE — Progress Notes (Signed)
   09/15/14 1400  PT Visit Information  Last PT Received On 09/15/14  Assistance Needed +1  History of Present Illness 79 yo male s/p L TKA 09/13/14.   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  Precautions  Precautions Fall;Knee  Precaution Comments monitor sats  Restrictions  LLE Weight Bearing WBAT  Pain Assessment  Pain Assessment 0-10  Pain Score 4  Pain Descriptors / Indicators Discomfort  Pain Intervention(s) Limited activity within patient's tolerance;Monitored during session;Repositioned;Ice applied  Cognition  Arousal/Alertness Awake/alert  Behavior During Therapy WFL for tasks assessed/performed  Overall Cognitive Status Within Functional Limits for tasks assessed  Bed Mobility  Overal bed mobility Modified Independent  Bed Mobility Supine to Sit  Supine to sit Modified independent (Device/Increase time)  General bed mobility comments HOB elevated, incr time  Transfers  Overall transfer level Needs assistance  Equipment used Rolling walker (2 wheeled)  Transfers Sit to/from Stand  Sit to Stand From elevated surface;Min guard  General transfer comment close guard for safety. VCs safety, hand placement  Ambulation/Gait  Ambulation/Gait assistance Min guard;Supervision  Ambulation Distance (Feet) 100 Feet  Assistive device Rolling walker (2 wheeled)  Gait Pattern/deviations Step-to pattern;Trunk flexed  General Gait Details close guard for safety, O2 sats 86% on RA during amb, incr to >91% O2 replaced at 2L at rest  PT - End of Session  Activity Tolerance Patient tolerated treatment well  Patient left in chair;with call bell/phone within reach;with  family/visitor present  Nurse Communication Mobility status  PT - Assessment/Plan  PT Plan Current plan remains appropriate  PT Frequency (ACUTE ONLY) 7X/week  Follow Up Recommendations SNF;Home health PT;Supervision - Intermittent  PT equipment Rolling walker with 5" wheels  PT Goal Progression  Progress towards PT goals Progressing toward goals  Acute Rehab PT Goals  PT Goal Formulation With patient  Time For Goal Achievement 09/20/14  Potential to Achieve Goals Good  PT General Charges  $$ ACUTE PT VISIT 1 Procedure  PT Treatments  $Gait Training 8-22 mins

## 2014-09-15 NOTE — Progress Notes (Signed)
Dr. Alda Berthold called regarding a critical value/emergent results after interpreting the patient's chest CT.  His recommendation was to obtain a consultation from Pulmonary and Critical Care Medicine.  I called and discussed the patient and the results with Dr. Christinia Gully.  He states that he will change the patients anticoagulation to heparin and that he will send someone to see the patient for a consult.    West Pugh Babish   PA-C  09/15/2014, 5:35 PM

## 2014-09-15 NOTE — Consult Note (Addendum)
PULMONARY / CRITICAL CARE MEDICINE   Name: Joshua [name] MRN: [id number] DOB: Feb 11, 1936    ADMISSION DATE:  09/13/2014 CONSULTATION DATE:  09/15/2014  REFERRING MD :  Dr. Alvan Dame  CHIEF COMPLAINT:  Shortness of breath  INITIAL PRESENTATION: 79 year old male status post total knee replacement 09/13/2014 developed a pulmonary embolism and pulmonary critical care medicine was consulted.  STUDIES:  09/15/2014 CT MG of chest> large bilateral pulmonary embolism with RV dilatation, emphysema bilaterally, coronary arteries are calcified  SIGNIFICANT EVENTS:    HISTORY OF PRESENT ILLNESS:  This is a very pleasant 80 year old male with no past medical history significant for blood clots who was admitted for an elective total knee replacement on the left side on 09/13/2014. His surgery was uncomplicated. On postop day 1 he said that he noted mild chest pain in the center of his chest which she said did not particularly bother him. However, he has noted some mild shortness of breath and he has remained persistently hypoxemic during his hospital stay. He has been ambulating and has been progressing well otherwise. Because of persistent hypoxemia a CT angiogram of the chest was performed which showed large bilateral pulmonary emboli.  PAST MEDICAL HISTORY :   has a past medical history of Hypertension; Phlebitis; Pneumonia; GERD (gastroesophageal reflux disease); Arthritis; and Folliculitis.  has past surgical history that includes pilonidal cyst removal ; Cholecystectomy; bone removed from little toe right foot ; and Total knee arthroplasty (Left, 09/13/2014). Prior to Admission medications   Medication Sig Start Date End Date Taking? Authorizing Provider  allopurinol (ZYLOPRIM) 100 MG tablet Take 100 mg by mouth daily.   Yes Historical Provider, MD  amoxicillin-clavulanate (AUGMENTIN) 875-125 MG per tablet Take 1 tablet by mouth 2 (two) times daily. for 10 days. Started 09/01/14 09/01/14  Yes Historical  Provider, MD  Cinnamon 500 MG capsule Take 500 mg by mouth daily.   Yes Historical Provider, MD  colchicine 0.6 MG tablet Take 0.6 mg by mouth daily.   Yes Historical Provider, MD  doxazosin (CARDURA) 4 MG tablet Take 4 mg by mouth daily. Patient takes in the pm   Yes Historical Provider, MD  furosemide (LASIX) 20 MG tablet Take 20 mg by mouth every Monday, Wednesday, and Friday.   Yes Historical Provider, MD  naproxen sodium (ANAPROX) 220 MG tablet Take 220 mg by mouth 2 (two) times daily as needed (Pain).   Yes Historical Provider, MD  pantoprazole (PROTONIX) 40 MG tablet Take 40 mg by mouth daily.   Yes Historical Provider, MD  ramipril (ALTACE) 2.5 MG capsule Take 2.5 mg by mouth daily.   Yes Historical Provider, MD   Allergies  Allergen Reactions  . Other     Beer- Swelling   . Sunflower Oil Swelling  . Sulfa Antibiotics Rash    FAMILY HISTORY:  has no family status information on file.  SOCIAL HISTORY:  reports that he quit smoking about 8 years ago. His smoking use included Cigarettes. He has a 50 pack-year smoking history. He has never used smokeless tobacco. He reports that he drinks about 3.6 oz of alcohol per week. He reports that he does not use illicit drugs.  REVIEW OF SYSTEMS:   Gen: Denies fever, chills, weight change, fatigue, night sweats HEENT: Denies blurred vision, double vision, hearing loss, tinnitus, sinus congestion, rhinorrhea, sore throat, neck stiffness, dysphagia PULM: per HPI CV: Noted chest pain yesterday, otherwise, edema, orthopnea, paroxysmal nocturnal dyspnea, palpitations GI: Denies abdominal pain, nausea, vomiting, diarrhea, hematochezia, melena, constipation,  change in bowel habits GU: Denies dysuria, hematuria, polyuria, oliguria, urethral discharge Endocrine: Denies hot or cold intolerance, polyuria, polyphagia or appetite change Derm: Denies rash, dry skin, scaling or peeling skin change Heme: Denies easy bruising, bleeding, bleeding  gums Neuro: Denies headache, numbness, weakness, slurred speech, loss of memory or consciousness   SUBJECTIVE:   VITAL SIGNS: Temp:  [97.8 F (36.6 C)-98.1 F (36.7 C)] 98.1 F (36.7 C) (01/28 1506) Pulse Rate:  [79-83] 83 (01/28 1506) Resp:  [16-18] 18 (01/28 1600) BP: (114-123)/(60-91) 114/60 mmHg (01/28 1506) SpO2:  [93 %-98 %] 97 % (01/28 1600) HEMODYNAMICS:   VENTILATOR SETTINGS:   INTAKE / OUTPUT:  Intake/Output Summary (Last 24 hours) at 09/15/14 1747 Last data filed at 09/15/14 1106  Gross per 24 hour  Intake   1700 ml  Output   1225 ml  Net    475 ml    PHYSICAL EXAMINATION: General:  Well-appearing, comfortable in bed Neuro:  Alert and oriented 4, moves all extremities well HEENT:  NCAT, extraocular movements are intact Cardiovascular:  Regular rate and rhythm, accentuated P2 noted, JVD noted Lungs:  Clear to auscultation bilaterally Abdomen:  Bowel sounds are positive, nondistended Musculoskeletal:  Normal bulk and tone, left knee wound is well dressed with a clean and dry dressing Skin:  Apart from wound no other abnormalities noted  LABS:  CBC  Recent Labs Lab 09/14/14 0512 09/15/14 0522  WBC 10.4 10.6*  HGB 14.1 14.0  HCT 44.1 44.2  PLT 143* 163   Coag's No results for input(s): APTT, INR in the last 168 hours. BMET  Recent Labs Lab 09/14/14 0512 09/15/14 0522  NA 134* 138  K 4.8 5.7*  CL 101 101  CO2 25 28  BUN 16 13  CREATININE 0.97 0.99  GLUCOSE 142* 110*   Electrolytes  Recent Labs Lab 09/14/14 0512 09/15/14 0522  CALCIUM 8.7 9.2   Sepsis Markers No results for input(s): LATICACIDVEN, PROCALCITON, O2SATVEN in the last 168 hours. ABG No results for input(s): PHART, PCO2ART, PO2ART in the last 168 hours. Liver Enzymes No results for input(s): AST, ALT, ALKPHOS, BILITOT, ALBUMIN in the last 168 hours. Cardiac Enzymes No results for input(s): TROPONINI, PROBNP in the last 168 hours. Glucose No results for input(s):  GLUCAP in the last 168 hours.  Imaging No results found.   ASSESSMENT / PLAN:  PULMONARY  A: Acute hypoxemic respiratory failure> this is a multifactorial problem and is secondary to the pulmonary embolism as well as emphysema Acute bilateral pulmonary emboli> provoked by recent knee replacement, RV strain noted on CT chest but he is hemodynamically stable with a normal heart rate and comfortable at rest.  Based on this see does not need thrombolytic therapy. This is his first blood clot and he has no family history of clotting disorders.  P:   -Heparin per pharmacy -Would transition to Xarelto or Eliquis tomorrow, plan for 3 months treatment -12 lead EKG now -Lower extremity Doppler ultrasound to evaluate for residual clot -Echocardiogram will be ordered to evaluate for RV strain -Check pro BNP and troponin -Continue oxygen, titrated to O2 saturation greater than 92% -He needs outpatient pulmonary function testing, I will arrange this with our office   Thank you for the consult, we will follow.  Roselie Awkward, MD [facility] Pager: [phone] Cell: [phone] If no response, call [phone]    09/15/2014, 5:47 PM

## 2014-09-16 ENCOUNTER — Telehealth: Payer: Self-pay | Admitting: *Deleted

## 2014-09-16 DIAGNOSIS — R0602 Shortness of breath: Secondary | ICD-10-CM

## 2014-09-16 DIAGNOSIS — I2699 Other pulmonary embolism without acute cor pulmonale: Secondary | ICD-10-CM

## 2014-09-16 LAB — CBC
HCT: 39.8 % (ref 39.0–52.0)
Hemoglobin: 12.7 g/dL — ABNORMAL LOW (ref 13.0–17.0)
MCH: 28.8 pg (ref 26.0–34.0)
MCHC: 31.9 g/dL (ref 30.0–36.0)
MCV: 90.2 fL (ref 78.0–100.0)
Platelets: 153 10*3/uL (ref 150–400)
RBC: 4.41 MIL/uL (ref 4.22–5.81)
RDW: 15.2 % (ref 11.5–15.5)
WBC: 6.1 10*3/uL (ref 4.0–10.5)

## 2014-09-16 LAB — HEPARIN LEVEL (UNFRACTIONATED)
Heparin Unfractionated: 0.27 IU/mL — ABNORMAL LOW (ref 0.30–0.70)
Heparin Unfractionated: 0.41 IU/mL (ref 0.30–0.70)
Heparin Unfractionated: <0.1 IU/mL — ABNORMAL LOW (ref 0.30–0.70)

## 2014-09-16 MED ORDER — POLYETHYLENE GLYCOL 3350 17 G PO PACK: 17.0000 g | PACK | Freq: Two times a day (BID) | ORAL | Status: DC

## 2014-09-16 MED ORDER — HYDROCODONE-ACETAMINOPHEN 7.5-325 MG PO TABS: 1.0000 | ORAL_TABLET | ORAL | Status: DC | PRN

## 2014-09-16 MED ORDER — TIZANIDINE HCL 4 MG PO TABS: 4.0000 mg | ORAL_TABLET | Freq: Four times a day (QID) | ORAL | Status: DC | PRN

## 2014-09-16 MED ORDER — RIVAROXABAN 15 MG PO TABS
15.0000 mg | ORAL_TABLET | Freq: Two times a day (BID) | ORAL | Status: DC
  Administered 2014-09-16 – 2014-09-17 (×3): 15 mg via ORAL
  Filled 2014-09-16 (×4): qty 1

## 2014-09-16 MED ORDER — HEPARIN BOLUS VIA INFUSION
2000.0000 [IU] | Freq: Once | INTRAVENOUS | Status: AC
  Administered 2014-09-16: 2000 [IU] via INTRAVENOUS
  Filled 2014-09-16: qty 2000

## 2014-09-16 MED ORDER — DOCUSATE SODIUM 100 MG PO CAPS: 100.0000 mg | ORAL_CAPSULE | Freq: Two times a day (BID) | ORAL | Status: DC

## 2014-09-16 MED ORDER — HEPARIN (PORCINE) IN NACL 100-0.45 UNIT/ML-% IJ SOLN
1900.0000 [IU]/h | INTRAMUSCULAR | Status: AC
  Administered 2014-09-16: 1900 [IU]/h via INTRAVENOUS
  Filled 2014-09-16 (×3): qty 250

## 2014-09-16 NOTE — Progress Notes (Signed)
Patient ID: Joshua [name], male   DOB: 12/19/1935, 79 y.o.   MRN: [id number] Subjective: 3 Days Post-Op Procedure(s) (LRB): LEFT TOTAL KNEE ARTHROPLASTY (Left)    Patient reports pain as mild. Events from yesterday reviewed.  Appreciate input and management from pulmonary/critical care  Understand concerns from family  Objective:   VITALS:   Filed Vitals:   09/16/14 0506  BP: 110/61  Pulse: 90  Temp: 97.9 F (36.6 C)  Resp: 16    Neurovascular intact Incision: dressing C/D/I  LABS  Recent Labs  09/14/14 0512 09/15/14 0522 09/16/14 0500  HGB 14.1 14.0 12.7*  HCT 44.1 44.2 39.8  WBC 10.4 10.6* 6.1  PLT 143* 163 153     Recent Labs  09/14/14 0512 09/15/14 0522  NA 134* 138  K 4.8 5.7*  BUN 16 13  CREATININE 0.97 0.99  GLUCOSE 142* 110*    No results for input(s): LABPT, INR in the last 72 hours.   Assessment/Plan: 3 Days Post-Op Procedure(s) (LRB): LEFT TOTAL KNEE ARTHROPLASTY (Left)   Advance diet Up with therapy - work on stairs today and maybe tomorrow am if necessary   Plan for discharge tomorrow Discharge home with home health   Will have pulmonary finalize treatment plan for discharge tomorrow on Xarelto vs Eliquis  I do feel that D/C to home appropriate Has been hospital now for 3 days plus then today Therapy will continue working with him I feel that his work towards gaining his independence will aid in his overall recovery, is he will at some point have to be at home and the sooner he is in that environment the sooner he will become acoustum to what he needs to do to get around independently.   Arrange for HHPT to start day of discharge

## 2014-09-16 NOTE — Progress Notes (Addendum)
ANTICOAGULATION CONSULT NOTE - Follow Up Consult  Pharmacy Consult for Heparin Indication: pulmonary embolus  Allergies  Allergen Reactions  . Other     Beer- Swelling   . Sunflower Oil Swelling  . Sulfa Antibiotics Rash    Patient Measurements: Height: _0  (190.5 cm) Weight: 272 lb 0.8 oz (123.4 kg) IBW/kg (Calculated) : 84.5 Heparin Dosing Weight: 111 kg  Vital Signs: Temp: 97.9 F (36.6 C) (01/29 0506) Temp Source: Oral (01/29 0506) BP: 110/61 mmHg (01/29 0506) Pulse Rate: 90 (01/29 0506)  Labs:  Recent Labs  09/14/14 0512 09/15/14 0522 09/15/14 1820 09/16/14 0030 09/16/14 0500  HGB 14.1 14.0  --   --  12.7*  HCT 44.1 44.2  --   --  39.8  PLT 143* 163  --   --  153  HEPARINUNFRC  --   --   --  0.27*  --   CREATININE 0.97 0.99  --   --   --   TROPONINI  --   --  0.03  --   --     Estimated Creatinine Clearance: 89.9 mL/min (by C-G formula based on Cr of 0.99).   Medications:  Infusions:  . heparin 1,900 Units/hr (09/16/14 4103)    Assessment: 64 yoM s/p left TKR on 1/26 on ASA EC 3106m BID from 1/27-1/28 for VTE prophylaxis.  On POD1, he c/o mild chest pain and SOB with Chest CT positive for bilateral PE and RV dilation.  Pharmacy is consulted to dose Heparin.  Today, 09/16/2014  Heparin level = 0.41 therapeutic on heparin at 19 ml/hr  Heparin level drawn late, 1115 instead of 1000 AM.  CBC: Hgb 12.7, Plt 153  POD#3, No bleeding or complications reported.    SCr 0.99, CrCl ~ 90 ml/min   Goal of Therapy:  Heparin level 0.3-0.7 units/ml Monitor platelets by anticoagulation protocol: Yes   Plan:   Continue heparin IV infusion at 1900 units/hr (19 ml/hr)  Heparin level in 8 hours to confirm therapeutic rate  Daily heparin level and CBC  Follow up long-term anticoagulation plan, Xarelto per PCCM.   CGretta ArabPharmD, BCPS Pager [phone]/29/2016 7:57 AM   Addendum: Pharmacy consulted for change to Xarelto from Heparin  drip. Plan:   Continue Heparin IV at 1900 units/hr today, then D/C at 1700  Start Xarelto 15 mg BID tonight, first dose given at 1700 with supper  Xarelto 174mBID with meals x21 days then change to 2019maily with supper.  Pharmacy to provide patient education.   ChrGretta ArabarmD, BCPS Pager [phone]/2016 12:18 PM

## 2014-09-16 NOTE — Progress Notes (Signed)
VASCULAR LAB PRELIMINARY  PRELIMINARY  PRELIMINARY  PRELIMINARY  Bilateral lower extremity venous duplex completed.    Preliminary report:  Bilateral:  No evidence of DVT, superficial thrombosis, or Baker's Cyst.   SLAUGHTER, [place], RVS 09/16/2014, 11:22 AM

## 2014-09-16 NOTE — Telephone Encounter (Signed)
[id number] 

## 2014-09-16 NOTE — Progress Notes (Signed)
ANTICOAGULATION CONSULT NOTE - f/u Consult  Pharmacy Consult for Heparin Indication: pulmonary embolus  Allergies  Allergen Reactions  . Other     Beer- Swelling   . Sunflower Oil Swelling  . Sulfa Antibiotics Rash   Patient Measurements: Height: 6' 3" (190.5 cm) Weight: 272 lb 0.8 oz (123.4 kg) IBW/kg (Calculated) : 84.5 Heparin Dosing Weight: 111kg  Vital Signs: Temp: 98 F (36.7 C) (01/29 0120) Temp Source: Oral (01/29 0120) BP: 117/59 mmHg (01/29 0120) Pulse Rate: 84 (01/29 0120)  Labs:  Recent Labs  09/14/14 0512 09/15/14 0522 09/15/14 1820 09/16/14 0030  HGB 14.1 14.0  --   --   HCT 44.1 44.2  --   --   PLT 143* 163  --   --   HEPARINUNFRC  --   --   --  0.27*  CREATININE 0.97 0.99  --   --   TROPONINI  --   --  0.03  --    Estimated Creatinine Clearance: 89.9 mL/min (by C-G formula based on Cr of 0.99).  Medical History: Past Medical History  Diagnosis Date  . Hypertension   . Phlebitis     right arm  at least 20 years ago   . Pneumonia     hx of pneumonia as a child   . GERD (gastroesophageal reflux disease)   . Arthritis   . Folliculitis     posterior scalp per office visit note of Dr Maudie Mercury 07/20/2014     Medications:  Scheduled:  . allopurinol  100 mg Oral Daily  . colchicine  0.6 mg Oral Daily  . docusate sodium  100 mg Oral BID  . doxazosin  4 mg Oral QHS  . furosemide  20 mg Oral Q M,W,F  . heparin  2,000 Units Intravenous Once  . HYDROcodone-acetaminophen  1-2 tablet Oral 6 times per day  . pantoprazole  40 mg Oral Daily  . polyethylene glycol  17 g Oral BID   Infusions:  . heparin     Assessment: 51 yoM s/p L TKR 1/26, on ASA EC 354m bid from 1/27 for VTE prophylaxis. Complaint of mild chest pain POD 1, and worsening SHOB, chest CT with bilateral PE and RV dilatation. INR pre-surgically was wnl.  Begin IV Heparin for PE, pharmacy dosing requested  Will d/c ASA EC 3261morder  Goal of Therapy:  Heparin level 0.3-0.7  units/ml Monitor platelets by anticoagulation protocol: Yes   Today, 1/29  1st HL= 0.27 units/ml (drawn after 6 hrs vs 8 hrs maybe not at Css, but CrCl ok) No bleeding/problems per RN.   Plan:   Rebolus 2000 unit IV x1  Then increase heparin drip to 1900 units/hr  Daily CBC, plan daily Heparin level once levels in range  Recheck HL in 8 hours   GrDorrene German/29/2016, 1:29 AM

## 2014-09-16 NOTE — Clinical Documentation Improvement (Signed)
Clarification Needed  The impression of the consult note on 09/15/2014 states Acute hypoxemic Respiratory Failure (mutilfactorial) and secondary to the Pulmonary Embolism as well as emphysema. Acute bilateral pulmonary embolism >provoked by recent knee replacement.   Was the Acute Respiratory Failure 2nd to Bilateral Pulmonary Embolism:       A Condition(s) is a complication of surgery/procedure.  (please specify)    A Condition(s)  is not a complication of surgery/procedure but is inherent, expected or integral.  (please specify)    A Condition(s) is unrelated to the procedure/surgery.  (please specify)    Other intended meaning (please specify)    Other condition    Cannot clinically determine

## 2014-09-16 NOTE — Progress Notes (Signed)
PULMONARY / CRITICAL CARE MEDICINE   Name: Joshua [name] MRN: [id number] DOB: [birth year]    ADMISSION DATE:  09/13/2014 CONSULTATION DATE:  09/15/2014  REFERRING MD :  Dr. Alvan Dame  CHIEF COMPLAINT:  Shortness of breath  INITIAL PRESENTATION: 79 year old male status post total knee replacement 09/13/2014 developed a pulmonary embolism and pulmonary critical care medicine was consulted.  STUDIES:  1/28  CT MG of chest >> large bilateral pulmonary embolism with RV dilatation, emphysema bilaterally, coronary arteries are calcified 1/29  ECHO >> 1/29  LE Doppler >>   SIGNIFICANT EVENTS:      SUBJECTIVE: Pt denies chest pain, no shortness of breath at rest.  Asking about discharge plans.   VITAL SIGNS: Temp:  [97.7 F (36.5 C)-98.2 F (36.8 C)] 97.9 F (36.6 C) (01/29 0506) Pulse Rate:  [78-90] 90 (01/29 0506) Resp:  [16-18] 16 (01/29 0506) BP: (110-121)/(55-66) 110/61 mmHg (01/29 0506) SpO2:  [93 %-98 %] 97 % (01/29 0506) Weight:  [272 lb 0.8 oz (123.4 kg)] 272 lb 0.8 oz (123.4 kg) (01/28 2001)  INTAKE / OUTPUT:  Intake/Output Summary (Last 24 hours) at 09/16/14 4970 Last data filed at 09/16/14 0600  Gross per 24 hour  Intake 567.22 ml  Output    200 ml  Net 367.22 ml    PHYSICAL EXAMINATION: General:  Well-appearing, comfortable in bed Neuro:  Alert and oriented 4, moves all extremities well HEENT:  NCAT, extraocular movements are intact Cardiovascular:  Regular rate and rhythm, JVD noted Lungs:  Clear to auscultation bilaterally Abdomen:  Bowel sounds are positive, nondistended Musculoskeletal:  Normal bulk and tone, left knee wound is well dressed with a clean and dry dressing Skin:  Apart from surgical wound no other abnormalities noted  LABS:  CBC  Recent Labs Lab 09/14/14 0512 09/15/14 0522 09/16/14 0500  WBC 10.4 10.6* 6.1  HGB 14.1 14.0 12.7*  HCT 44.1 44.2 39.8  PLT 143* 163 153   BMET  Recent Labs Lab 09/14/14 0512 09/15/14 0522  NA  134* 138  K 4.8 5.7*  CL 101 101  CO2 25 28  BUN 16 13  CREATININE 0.97 0.99  GLUCOSE 142* 110*   Electrolytes  Recent Labs Lab 09/14/14 0512 09/15/14 0522  CALCIUM 8.7 9.2   Cardiac Enzymes  Recent Labs Lab 09/15/14 1820  TROPONINI 0.03   Glucose No results for input(s): GLUCAP in the last 168 hours.  Imaging Ct Angio Chest Pe W/cm &/or Wo Cm  09/15/2014   CLINICAL DATA:  Shortness breath for 2 days, recent knee replacement surgery  EXAM: CT ANGIOGRAPHY CHEST WITH CONTRAST  TECHNIQUE: Multidetector CT imaging of the chest was performed using the standard protocol during bolus administration of intravenous contrast. Multiplanar CT image reconstructions and MIPs were obtained to evaluate the vascular anatomy.  CONTRAST:  165m OMNIPAQUE IOHEXOL 350 MG/ML SOLN  COMPARISON:  None.  FINDINGS: The SVC is patent. RV/LV ratio 1.45. Dilated central pulmonary arteries. Bilateral partially occlusive segmental pulmonary emboli and a central saddle embolus at the bifurcation of the right pulmonary artery. Patent superior and inferior pulmonary veins bilaterally. Scattered coronary calcifications. Adequate contrast opacification of the thoracic aorta with no evidence of dissection, aneurysm, or stenosis. There is bovine variant brachiocephalic arch anatomy without proximal stenosis. Scattered aortic calcifications.  No pleural or pericardial effusion. Small hiatal hernia. No hilar or mediastinal adenopathy. Moderate emphysematous changes most evident in the lung apices. Trace pleural effusions right greater than left with minimal adjacent atelectasis posteriorly in the lower  lobes. Lungs are otherwise clear. Bridging osteophytes across multiple contiguous levels in the mid thoracic spine. Sternum intact. Visualized portions of upper abdomen unremarkable.  Review of the MIP images confirms the above findings.  IMPRESSION: 1. Positive for bilateral acute PE with CT evidence of right heart strain (RV/LV  Ratio = 1.45) consistent with at least submassive (intermediate risk)PE. The presence of right heart strain has been associated with an increased risk of morbidity and mortality. Consultation with Pulmonary and Critical Care Medicine is recommended. Critical Value/emergent results were called by telephone at the time of interpretation on 09/15/2014 at 5:08 pm to [facility] PA, who verbally acknowledged these results. 2. Emphysema. 3. Atherosclerosis, including aortic and coronary artery disease. Please note that although the presence of coronary artery calcium documents the presence of coronary artery disease, the severity of this disease and any potential stenosis cannot be assessed on this non-gated CT examination. Assessment for potential risk factor modification, dietary therapy or pharmacologic therapy may be warranted, if clinically indicated. 4. Hiatal hernia.   Electronically Signed   By: Arne Cleveland M.D.   On: 09/15/2014 17:08     ASSESSMENT / PLAN:  PULMONARY  A:  Acute hypoxemic respiratory failure - this is a multifactorial problem and is secondary to the pulmonary embolism as well as emphysema Acute bilateral pulmonary emboli - provoked by recent knee replacement / immobility prior to surgery?, RV strain noted on CT chest but he is hemodynamically stable with a normal heart rate and comfortable at rest.  This is his first blood clot and he has no family history of clotting disorders.   P:   -Heparin per pharmacy with transition to [place] for 3 -6 months treatment given high clot burden -Lower extremity Doppler ultrasound to evaluate for residual clot >> -Echocardiogram will be ordered to evaluate for RV strain >> -BNP 127 and troponin 0.03 -Continue oxygen, titrated to O2 saturation greater than 92%.  -Will need O2 assessment for home needs prior to discharge -He needs outpatient pulmonary function testing, this will be arranged with our office     Joshua Gens,  NP-C [facility] Pgr: [phone] or [phone]  Examined pt , agree with above Keep inpt until doppler /echo obtained & known to tolerate/ obtain xarelto He seems to have some dyspnea prior to surgery  Joshua [name]. MD  09/16/2014, 9:52 AM

## 2014-09-16 NOTE — Discharge Instructions (Signed)
Information on my medicine - XARELTO (rivaroxaban)  This medication education was reviewed with me or my healthcare representative as part of my discharge preparation.  The pharmacist that spoke with me during my hospital stay was:  Leeroy Bock, [place]? Xarelto was prescribed to treat blood clots that may have been found in the veins of your legs (deep vein thrombosis) or in your lungs (pulmonary embolism) and to reduce the risk of them occurring again.  What do you need to know about Xarelto? The starting dose is one 15 mg tablet taken TWICE daily with food for the FIRST 21 DAYS then on (enter date)  2/20  the dose is changed to one 20 mg tablet taken ONCE A DAY with your evening meal.  DO NOT stop taking Xarelto without talking to the health care provider who prescribed the medication.  Refill your prescription for 20 mg tablets before you run out.  After discharge, you should have regular check-up appointments with your healthcare provider that is prescribing your Xarelto.  In the future your dose may need to be changed if your kidney function changes by a significant amount.  What do you do if you miss a dose? If you are taking Xarelto TWICE DAILY and you miss a dose, take it as soon as you remember. You may take two 15 mg tablets (total 30 mg) at the same time then resume your regularly scheduled 15 mg twice daily the next day.  If you are taking Xarelto ONCE DAILY and you miss a dose, take it as soon as you remember on the same day then continue your regularly scheduled once daily regimen the next day. Do not take two doses of Xarelto at the same time.   Important Safety Information Xarelto is a blood thinner medicine that can cause bleeding. You should call your healthcare provider right away if you experience any of the following: ? Bleeding from an injury or your nose that does not stop. ? Unusual colored urine (red or dark  brown) or unusual colored stools (red or black). ? Unusual bruising for unknown reasons. ? A serious fall or if you hit your head (even if there is no bleeding).  Some medicines may interact with Xarelto and might increase your risk of bleeding while on Xarelto. To help avoid this, consult your healthcare provider or pharmacist prior to using any new prescription or non-prescription medications, including herbals, vitamins, non-steroidal anti-inflammatory drugs (NSAIDs) and supplements.  This website has more information on Xarelto: [email or web address]

## 2014-09-16 NOTE — Progress Notes (Signed)
Physical Therapy Treatment Patient Details Name: Joshua [name] MRN: [id number] DOB: [birth year] Today's Date: September 25, 2014    History of Present Illness 79 yo male s/p L TKA 09/13/14, found to have bil PE     PT Comments    Pt progressing; will need to practice stairs again in am  Follow Up Recommendations  Home health PT;Supervision for mobility/OOB (MD states pt to go home)     Equipment Recommendations  Rolling walker with 5" wheels;3in1 (PT)    Recommendations for Other Services       Precautions / Restrictions Precautions Precautions: Fall;Knee Precaution Comments: monitor sats Restrictions LLE Weight Bearing: Weight bearing as tolerated    Mobility  Bed Mobility Overal bed mobility: Modified Independent Bed Mobility: Supine to Sit     Supine to sit: Modified independent (Device/Increase time);HOB elevated     General bed mobility comments: HOB elevated, incr time  Transfers Overall transfer level: Needs assistance Equipment used: Rolling walker (2 wheeled) Transfers: Sit to/from Stand Sit to Stand: From elevated surface;Min guard         General transfer comment: close guard for safety. VCs safety, hand placement  Ambulation/Gait Ambulation/Gait assistance: Supervision Ambulation Distance (Feet): 100 Feet Assistive device: Rolling walker (2 wheeled) Gait Pattern/deviations: Step-to pattern     General Gait Details: incr time, slower gait today; sats 94% at rest, 90% after amb on 2L, incr to 92% after rest x 30sec   Stairs            Wheelchair Mobility    Modified Rankin (Stroke Patients Only)       Balance                                    Cognition Arousal/Alertness: Awake/alert Behavior During Therapy: WFL for tasks assessed/performed Overall Cognitive Status: Within Functional Limits for tasks assessed                      Exercises Total Joint Exercises Ankle Circles/Pumps: AROM;Both;10  reps;Supine Quad Sets: AROM;Both;10 reps;Supine    General Comments        Pertinent Vitals/Pain Pain Assessment: 0-10 Pain Score: 2  Pain Location: L knee Pain Descriptors / Indicators: Sore Pain Intervention(s): Limited activity within patient's tolerance;Monitored during session    Home Living                      Prior Function            PT Goals (current goals can now be found in the care plan section) Acute Rehab PT Goals Patient Stated Goal: 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 PT Goal Formulation: With patient Time For Goal Achievement: 09/20/14 Potential to Achieve Goals: Good Progress towards PT goals: Progressing toward goals    Frequency  7X/week    PT Plan Current plan remains appropriate    Co-evaluation             End of Session Equipment Utilized During Treatment: Gait belt;Oxygen Activity Tolerance: Patient tolerated treatment well Patient left: in chair;with call bell/phone within reach;with family/visitor present     Time: 3267-1245 PT Time Calculation (min) (ACUTE ONLY): 30 min  Charges:  $Gait Training: 23-37 mins                    G Codes:      Kenyon Ana 09/25/2014,  11:54 AM

## 2014-09-16 NOTE — Telephone Encounter (Signed)
Joshua Doom, MD  P Lbpu Triage Pool  Hi,   Please arrange f/u with me or Tammy Parrett in 2-3 weeks for PE and emphysema.    Thanks   [employer]

## 2014-09-16 NOTE — Progress Notes (Signed)
Echocardiogram 2D Echocardiogram has been performed.  Joelene Millin 09/16/2014, 12:27 PM

## 2014-09-17 DIAGNOSIS — I272 Other secondary pulmonary hypertension: Secondary | ICD-10-CM

## 2014-09-17 LAB — CBC
HCT: 40.2 % (ref 39.0–52.0)
Hemoglobin: 12.7 g/dL — ABNORMAL LOW (ref 13.0–17.0)
MCH: 28.7 pg (ref 26.0–34.0)
MCHC: 31.6 g/dL (ref 30.0–36.0)
MCV: 90.7 fL (ref 78.0–100.0)
Platelets: 173 10*3/uL (ref 150–400)
RBC: 4.43 MIL/uL (ref 4.22–5.81)
RDW: 15.2 % (ref 11.5–15.5)
WBC: 6.9 10*3/uL (ref 4.0–10.5)

## 2014-09-17 MED ORDER — RIVAROXABAN (XARELTO) VTE STARTER PACK (15 & 20 MG): ORAL_TABLET | ORAL | Status: DC

## 2014-09-17 MED ORDER — RIVAROXABAN 15 MG PO TABS: 15.0000 mg | ORAL_TABLET | Freq: Two times a day (BID) | ORAL | Status: DC

## 2014-09-17 NOTE — Progress Notes (Signed)
CARE MANAGEMENT NOTE 09/17/2014  Patient:  Joshua [name], Joshua [name]   Account Number:  [id number]  Date Initiated:  09/15/2014  Documentation initiated by:  [facility]  Subjective/Objective Assessment:   adm: LEFT TOTAL KNEE ARTHROPLASTY (Left)     Action/Plan:   discharge planning   Anticipated DC Date:  09/17/2014   Anticipated DC Plan:  [place]  CM consult      Choice offered to / List presented to:  C-1 Patient   DME arranged  [facility]ALK[facility] - PLATFORM  3-N-1      DME agency  [facility] arranged  [facility]   Status of service:  Completed, signed off Medicare Important Message given?  YES (If response is "NO", the following Medicare IM given date fields will be blank) Date Medicare IM given:  09/16/2014 Medicare IM given by:   Date Additional Medicare IM given:   Additional Medicare IM given by:    Discharge Disposition:  [place]  Per UR Regulation:  Reviewed for med. necessity/level of care/duration of stay  If discussed at [place] of Stay Meetings, dates discussed:    Comments:  09/17/14 Dessa Phi RN BSN NCM [place] qualified for home 02.d/c home w/[facility], dme-rw,3n1,02-[facility] dleivered to rm  .09/16/14 Dessa Phi RN BSN NCM [phone] Transferred from 6th floor-POD#3 LTKA, developed bilat PE-hep gtt.Per MD d/c plan is for home w/HH.[facility] agency following, rep tim aware & awaiting [place] orders.Recommend HHPT,rw,3n1.Patient/spouse aware(initially some concerns about going home, preferring snf, but now understands the benefit of home,& MD recommendations).await final HHPT,rw,3n1 order.  09/15/14 12:30 Cm met with pt and Wife, Joshua [name] [phone], in room to discuss disposition.  Pt to go to SNF, [facility] at discharge; [facility] arranging.  No other CM needs were communicated.  Joshua [name], BSN, [place].

## 2014-09-17 NOTE — Progress Notes (Signed)
SATURATION QUALIFICATIONS: (This note is used to comply with regulatory documentation for home oxygen)  Patient Saturations on Room Air at Rest = 77%  Patient Saturations on Room Air while Ambulating = 82%  Patient Saturations on 2 Liters of oxygen while Ambulating = 92%   Physical Therapy Treatment Kenyon Ana, PT Pager: [phone] 09/17/2014  PWILLIAMS,TARA 09/17/2014, 2:00 PM

## 2014-09-17 NOTE — Progress Notes (Signed)
SATURATION QUALIFICATIONS: (This note is used to comply with regulatory documentation for home oxygen)  Patient Saturations on Room Air at Rest = 77%  Patient Saturations on Room Air while Ambulating = 75%  Patient Saturations on 2 Liters of oxygen while Ambulating = 90%  Please briefly explain why patient needs home oxygen:

## 2014-09-17 NOTE — Progress Notes (Signed)
Physical Therapy Treatment Patient Details Name: Joshua [name] MRN: [id number] DOB: [birth year] Today's Date: 09/17/2014    History of Present Illness 79 yo male s/p L TKA 09/13/14, found to have bil PE     PT Comments    Pt  Sleepy today, states he is tired, O2 sats 77% on entering room, O2 replaced, see other note for further info on O2 levels while amb with PT  Follow Up Recommendations  Home health PT;Supervision for mobility/OOB     Equipment Recommendations  Rolling walker with 5" wheels;3in1 (PT)    Recommendations for Other Services       Precautions / Restrictions Precautions Precautions: Fall;Knee Precaution Comments: monitor sats Restrictions LLE Weight Bearing: Weight bearing as tolerated    Mobility  Bed Mobility Overal bed mobility: Needs Assistance Bed Mobility: Supine to Sit     Supine to sit: Min assist     General bed mobility comments: incr time, assist with LLE  Transfers Overall transfer level: Needs assistance Equipment used: Rolling walker (2 wheeled) Transfers: Sit to/from Stand Sit to Stand: Min assist;Min guard         General transfer comment: assist for wt shift to stand  Ambulation/Gait Ambulation/Gait assistance: Min guard Ambulation Distance (Feet): 80 Feet Assistive device: Rolling walker (2 wheeled) Gait Pattern/deviations: Step-to pattern;Trunk flexed;Antalgic     General Gait Details: incr time, slower gait today;    Stairs Stairs: Yes Stairs assistance: Min guard Stair Management: No rails;One rail Right;One rail Left;Step to pattern;Forwards;With crutches Number of Stairs: 3 (x2) General stair comments: verbal cues for sequence and techniquedid not like using crutches  Wheelchair Mobility    Modified Rankin (Stroke Patients Only)       Balance                                    Cognition Arousal/Alertness: Lethargic (easily arousable) Behavior During Therapy: WFL for tasks  assessed/performed Overall Cognitive Status: Within Functional Limits for tasks assessed                      Exercises Total Joint Exercises Ankle Circles/Pumps: AROM;Both;10 reps;Supine Quad Sets: AROM;Both;10 reps;Supine Heel Slides: AROM;AAROM;Left;10 reps Goniometric ROM: ~-6 to 80*    General Comments General comments (skin integrity, edema, etc.): see note for O2 sats      Pertinent Vitals/Pain Pain Assessment: 0-10 Pain Score: 2  Pain Location: L knee Pain Descriptors / Indicators: Discomfort Pain Intervention(s): Limited activity within patient's tolerance;Monitored during session;Ice applied    Home Living                      Prior Function            PT Goals (current goals can now be found in the care plan section) Acute Rehab PT Goals 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

## 2014-09-17 NOTE — Progress Notes (Signed)
PULMONARY / CRITICAL CARE MEDICINE   Name: Joshua [name] MRN: [id number] DOB: [birth year]    ADMISSION DATE:  09/13/2014 CONSULTATION DATE:  09/15/2014  REFERRING MD :  Dr. Alvan Dame  CHIEF COMPLAINT:  Shortness of breath  INITIAL PRESENTATION: 79 year old male status post total knee replacement 09/13/2014 developed a pulmonary embolism and pulmonary critical care medicine was consulted.  STUDIES:  1/28  CT MG of chest >> large bilateral pulmonary embolism with RV dilatation, emphysema bilaterally, coronary arteries are calcified 1/29  ECHO >> RV cavity severely dilated, mild decrease in RV fxn, increased PAP 1/29  LE Doppler >> no DVT  SIGNIFICANT EVENTS:      SUBJECTIVE:  Denies significant sob or cp.  Has significant desats on room air at rest and with walking.  VITAL SIGNS: Temp:  [98 F (36.7 C)-98.5 F (36.9 C)] 98.5 F (36.9 C) (01/30 1348) Pulse Rate:  [83-97] 97 (01/30 1348) Resp:  [16-20] 20 (01/30 1348) BP: (123-161)/(66-70) 161/67 mmHg (01/30 1348) SpO2:  [77 %-99 %] 99 % (01/30 1348)  INTAKE / OUTPUT:  Intake/Output Summary (Last 24 hours) at 09/17/14 1502 Last data filed at 09/17/14 1300  Gross per 24 hour  Intake    120 ml  Output    950 ml  Net   -830 ml    PHYSICAL EXAMINATION: General:obese male, nad Neuro:  Alert and oriented 4, moves all extremities well HEENT:  Nose without purulence or d/c noted Cardiovascular:  Regular rate and rhythm, prominent P2 Lungs:  Clear to auscultation bilaterally except a few basilar crackles. Abdomen:  Bowel sounds are positive, nondistended LE with mild edema, compression hose in place.  LABS:  CBC  Recent Labs Lab 09/15/14 0522 09/16/14 0500 09/17/14 0507  WBC 10.6* 6.1 6.9  HGB 14.0 12.7* 12.7*  HCT 44.2 39.8 40.2  PLT 163 153 173   BMET  Recent Labs Lab 09/14/14 0512 09/15/14 0522  NA 134* 138  K 4.8 5.7*  CL 101 101  CO2 25 28  BUN 16 13  CREATININE 0.97 0.99  GLUCOSE 142* 110*    Electrolytes  Recent Labs Lab 09/14/14 0512 09/15/14 0522  CALCIUM 8.7 9.2   Cardiac Enzymes  Recent Labs Lab 09/15/14 1820  TROPONINI 0.03   Glucose No results for input(s): GLUCAP in the last 168 hours.  Imaging No results found.   ASSESSMENT / PLAN:  PULMONARY  A:  Acute hypoxemic respiratory failure - this is a multifactorial problem and is secondary to the pulmonary embolism as well as emphysema Acute bilateral pulmonary emboli - provoked by recent knee replacement / immobility prior to surgery?, He has RV dilatation and mild decrease in RV function by echo, as well as increased PA pressures.  No residual DVT on dopplers.  He remains hemodynamically stable, but is going to need oxygen 24/7 at discharge.  P:   -ok to d/c home on oxygen at 2lpm -needs to stay on xarelto 95m bid for 21 days, then 280mqday. -please schedule followup with TaRexene Edisonur NP in the office for 1-2 weeks, then can see Dr AlElsworth Sohohereafter.      CLKathee DeltonD  09/17/2014, 3:02 PM

## 2014-09-17 NOTE — Progress Notes (Signed)
Subjective: 4 Days Post-Op Procedure(s) (LRB): LEFT TOTAL KNEE ARTHROPLASTY (Left) Patient reports pain as well controled.  Mild pain to Left knee. Tolerating Po's. Progressing with PT. Using O2.  Denies SOB, CP, or calf pain. Patient reports he wants to go home.  Objective: Vital signs in last 24 hours: Temp:  [98 F (36.7 C)-98.2 F (36.8 C)] 98.2 F (36.8 C) (01/30 0512) Pulse Rate:  [69-96] 96 (01/30 0512) Resp:  [16-20] 20 (01/30 0512) BP: (113-141)/(57-70) 141/70 mmHg (01/30 0512) SpO2:  [93 %-95 %] 93 % (01/30 0512)  Intake/Output from previous day: 01/29 0701 - 01/30 0700 In: -  Out: 650 [Urine:650] Intake/Output this shift:     Recent Labs  09/15/14 0522 09/16/14 0500 09/17/14 0507  HGB 14.0 12.7* 12.7*    Recent Labs  09/16/14 0500 09/17/14 0507  WBC 6.1 6.9  RBC 4.41 4.43  HCT 39.8 40.2  PLT 153 173    Recent Labs  09/15/14 0522  NA 138  K 5.7*  CL 101  CO2 28  BUN 13  CREATININE 0.99  GLUCOSE 110*  CALCIUM 9.2   No results for input(s): LABPT, INR in the last 72 hours.  Alert and oriented x3. RRR, Lungs clear, BS x4. Left Calf soft and non tender. L knee dressing C/D/I. No DVT signs. No signs of infection or compartment syndrome. LLE neurovascularly intact.   Assessment/Plan: 4 Days Post-Op Procedure(s) (LRB): LEFT TOTAL KNEE ARTHROPLASTY (Left) D/c home when cleared by critical care Nurse to call for D/c order Home Health and PT F/u in office with Dr. Alvan Dame after D/c F/u up with CC outpatient after D/c Xarelto 41m bid Rx printed, CC may change if indicated. All questions encouraged and answered in detail.  STILWELL, BRYSON L 09/17/2014, 10:09 AM

## 2014-09-18 DIAGNOSIS — I2692 Saddle embolus of pulmonary artery without acute cor pulmonale: Secondary | ICD-10-CM | POA: Diagnosis not present

## 2014-09-18 DIAGNOSIS — I2510 Atherosclerotic heart disease of native coronary artery without angina pectoris: Secondary | ICD-10-CM | POA: Diagnosis not present

## 2014-09-18 DIAGNOSIS — M1990 Unspecified osteoarthritis, unspecified site: Secondary | ICD-10-CM | POA: Diagnosis not present

## 2014-09-18 DIAGNOSIS — T81718D Complication of other artery following a procedure, not elsewhere classified, subsequent encounter: Secondary | ICD-10-CM | POA: Diagnosis not present

## 2014-09-18 DIAGNOSIS — J439 Emphysema, unspecified: Secondary | ICD-10-CM | POA: Diagnosis not present

## 2014-09-18 DIAGNOSIS — Z471 Aftercare following joint replacement surgery: Secondary | ICD-10-CM | POA: Diagnosis not present

## 2014-09-18 NOTE — Discharge Summary (Signed)
Physician Discharge Summary  Patient ID: JARMAL LEWELLING MRN: [id number] DOB/AGE: Sep 18, 1935 79 y.o.  Admit date: 09/13/2014 Discharge date: 09/17/2014  Admission Diagnoses: Knee OA  Discharge Diagnoses:  Principal Problem:   S/P left TKA Active Problems:   S/P knee replacement   Discharged Condition: good  Hospital Course:  JERRID FORGETTE is a 79 y.o. who was admitted to [facility]. They were brought to the operating room on 09/13/2014 and underwent Procedure(s): LEFT TOTAL KNEE ARTHROPLASTY.  Patient tolerated the procedure well and was later transferred to the recovery room and then to the orthopaedic floor for postoperative care.  They were given PO and IV analgesics for pain control following their surgery.  They were given 24 hours of postoperative antibiotics of  Anti-infectives    Start     Dose/Rate Route Frequency Ordered Stop   09/13/14 1600  ceFAZolin (ANCEF) IVPB 2 g/50 mL premix     2 g100 mL/hr over 30 Minutes Intravenous Every 6 hours 09/13/14 1305 09/13/14 2330   09/13/14 0600  ceFAZolin (ANCEF) 3 g in dextrose 5 % 50 mL IVPB     3 g160 mL/hr over 30 Minutes Intravenous On call to O.R. 09/12/14 1456 09/13/14 0949     and started on DVT prophylaxis in the form of ASA.   PT and OT were ordered for total joint protocol.  Discharge planning consulted to help with postop disposition and equipment needs.  Patient had a good night on the evening of surgery and started to get up OOB with therapy on day one. He developed difficulty breathing and chest discomfort. A spiral chest CT found Pulmonary Emboli. Critical Care was consulted to manage.    Continued to work with therapy into day two.  Dressing was WNL.  By day three, the patient had progressed with therapy and meeting their goals.  Incision was healing well. Pt tolerating Xeralto well. Patient was seen in rounds and was ready to go home.  Consults: Critical care team  Significant Diagnostic Studies: Chest CT,  Cardiac Echo, Venous doppler  Treatments: Routine TKA and PE management  Discharge Exam: Blood pressure 161/67, pulse 97, temperature 98.5 F (36.9 C), temperature source Oral, resp. rate 18, height _0  (1.905 m), weight 123.4 kg (272 lb 0.8 oz), SpO2 99 %. Alert and oriented x3. RRR, Lungs clear, BS x4. Left Calf soft and non tender. L knee dressing C/D/I. No DVT signs. No signs of infection or compartment syndrome. LLE neurovascularly intact.   Disposition: 01-Home or Self Care  Discharge Instructions    Call MD / Call 911    Complete by:  As directed   If you experience chest pain or shortness of breath, CALL 911 and be transported to the hospital emergency room.  If you develope a fever above 101 F, pus (white drainage) or increased drainage or redness at the wound, or calf pain, call your surgeon's office.     Change dressing    Complete by:  As directed   Maintain surgical dressing until follow up in the clinic. If the edges start to pull up, may reinforce with tape. If the dressing is no longer working, may remove and cover with gauze and tape, but must keep the area dry and clean.  Call with any questions or concerns.     Constipation Prevention    Complete by:  As directed   Drink plenty of fluids.  Prune juice may be helpful.  You may use a stool  softener, such as Colace (over the counter) 100 mg twice a day.  Use MiraLax (over the counter) for constipation as needed.     Diet - low sodium heart healthy    Complete by:  As directed      Discharge instructions    Complete by:  As directed   Maintain surgical dressing until follow up in the clinic. If the edges start to pull up, may reinforce with tape. If the dressing is no longer working, may remove and cover with gauze and tape, but must keep the area dry and clean.  Follow up in 2 weeks at [facility]. Call with any questions or concerns.     Increase activity slowly as tolerated    Complete by:  As directed       TED hose    Complete by:  As directed   Use stockings (TED hose) for 2 weeks on both leg(s).  You may remove them at night for sleeping.     Weight bearing as tolerated    Complete by:  As directed   Laterality:  left  Extremity:  Lower            Medication List    STOP taking these medications        amoxicillin-clavulanate 875-125 MG per tablet  Commonly known as:  AUGMENTIN     naproxen sodium 220 MG tablet  Commonly known as:  ANAPROX      TAKE these medications        allopurinol 100 MG tablet  Commonly known as:  ZYLOPRIM  Take 100 mg by mouth daily.     Cinnamon 500 MG capsule  Take 500 mg by mouth daily.     colchicine 0.6 MG tablet  Take 0.6 mg by mouth daily.     docusate sodium 100 MG capsule  Commonly known as:  COLACE  Take 1 capsule (100 mg total) by mouth 2 (two) times daily.     doxazosin 4 MG tablet  Commonly known as:  CARDURA  Take 4 mg by mouth daily. Patient takes in the pm     furosemide 20 MG tablet  Commonly known as:  LASIX  Take 20 mg by mouth every Monday, Wednesday, and Friday.     HYDROcodone-acetaminophen 7.5-325 MG per tablet  Commonly known as:  NORCO  Take 1-2 tablets by mouth every 4 (four) hours as needed for moderate pain.     pantoprazole 40 MG tablet  Commonly known as:  PROTONIX  Take 40 mg by mouth daily.     polyethylene glycol packet  Commonly known as:  MIRALAX / GLYCOLAX  Take 17 g by mouth 2 (two) times daily.     ramipril 2.5 MG capsule  Commonly known as:  ALTACE  Take 2.5 mg by mouth daily.     Rivaroxaban 15 & 20 MG Tbpk  Commonly known as:  XARELTO STARTER PACK  Take as directed on package: Start with one 71m tablet by mouth twice a day with food. On Day 22, switch to one 228mtablet once a day with food.     tiZANidine 4 MG tablet  Commonly known as:  ZANAFLEX  Take 1 tablet (4 mg total) by mouth every 6 (six) hours as needed for muscle spasms.           Follow-up Information    Follow  up with OLMauri PoleMD. Schedule an appointment as soon as possible for a visit in 2  weeks.   Specialty:  Orthopedic Surgery   Contact information:   [address] [place] [address] [phone]      F/U with Critical Care Oupatient  Signed: Lajean Manes 09/18/2014, 7:22 AM

## 2014-09-19 DIAGNOSIS — J439 Emphysema, unspecified: Secondary | ICD-10-CM | POA: Diagnosis not present

## 2014-09-19 DIAGNOSIS — I2692 Saddle embolus of pulmonary artery without acute cor pulmonale: Secondary | ICD-10-CM | POA: Diagnosis not present

## 2014-09-19 DIAGNOSIS — T81718D Complication of other artery following a procedure, not elsewhere classified, subsequent encounter: Secondary | ICD-10-CM | POA: Diagnosis not present

## 2014-09-19 DIAGNOSIS — M1990 Unspecified osteoarthritis, unspecified site: Secondary | ICD-10-CM | POA: Diagnosis not present

## 2014-09-19 DIAGNOSIS — I2510 Atherosclerotic heart disease of native coronary artery without angina pectoris: Secondary | ICD-10-CM | POA: Diagnosis not present

## 2014-09-19 DIAGNOSIS — Z471 Aftercare following joint replacement surgery: Secondary | ICD-10-CM | POA: Diagnosis not present

## 2014-09-19 NOTE — Telephone Encounter (Signed)
Spoke with pt. He has been scheduled for 10/10/14 at 10:15am with TP.

## 2014-09-21 DIAGNOSIS — M1990 Unspecified osteoarthritis, unspecified site: Secondary | ICD-10-CM | POA: Diagnosis not present

## 2014-09-21 DIAGNOSIS — I2692 Saddle embolus of pulmonary artery without acute cor pulmonale: Secondary | ICD-10-CM | POA: Diagnosis not present

## 2014-09-21 DIAGNOSIS — J439 Emphysema, unspecified: Secondary | ICD-10-CM | POA: Diagnosis not present

## 2014-09-21 DIAGNOSIS — Z471 Aftercare following joint replacement surgery: Secondary | ICD-10-CM | POA: Diagnosis not present

## 2014-09-21 DIAGNOSIS — T81718D Complication of other artery following a procedure, not elsewhere classified, subsequent encounter: Secondary | ICD-10-CM | POA: Diagnosis not present

## 2014-09-21 DIAGNOSIS — I2510 Atherosclerotic heart disease of native coronary artery without angina pectoris: Secondary | ICD-10-CM | POA: Diagnosis not present

## 2014-09-23 DIAGNOSIS — Z471 Aftercare following joint replacement surgery: Secondary | ICD-10-CM | POA: Diagnosis not present

## 2014-09-23 DIAGNOSIS — T81718D Complication of other artery following a procedure, not elsewhere classified, subsequent encounter: Secondary | ICD-10-CM | POA: Diagnosis not present

## 2014-09-23 DIAGNOSIS — M1990 Unspecified osteoarthritis, unspecified site: Secondary | ICD-10-CM | POA: Diagnosis not present

## 2014-09-23 DIAGNOSIS — J439 Emphysema, unspecified: Secondary | ICD-10-CM | POA: Diagnosis not present

## 2014-09-23 DIAGNOSIS — I2692 Saddle embolus of pulmonary artery without acute cor pulmonale: Secondary | ICD-10-CM | POA: Diagnosis not present

## 2014-09-23 DIAGNOSIS — I2510 Atherosclerotic heart disease of native coronary artery without angina pectoris: Secondary | ICD-10-CM | POA: Diagnosis not present

## 2014-09-26 DIAGNOSIS — M1712 Unilateral primary osteoarthritis, left knee: Secondary | ICD-10-CM | POA: Diagnosis not present

## 2014-09-27 DIAGNOSIS — Z96652 Presence of left artificial knee joint: Secondary | ICD-10-CM | POA: Diagnosis not present

## 2014-09-27 DIAGNOSIS — Z471 Aftercare following joint replacement surgery: Secondary | ICD-10-CM | POA: Diagnosis not present

## 2014-09-29 DIAGNOSIS — M1712 Unilateral primary osteoarthritis, left knee: Secondary | ICD-10-CM | POA: Diagnosis not present

## 2014-10-03 ENCOUNTER — Telehealth: Payer: Self-pay | Admitting: Pulmonary Disease

## 2014-10-03 NOTE — Telephone Encounter (Signed)
Called and spoke with pt and he stated that he was started on xarelto.  He stated that the package says to contact your doctor before you start on the 22nd day.   He stated that he will take the 21 dose on Friday and he wanted to make sure that BQ wanted him to continue this medication.  BQ please advise. Thanks  Allergies  Allergen Reactions  . Other     Beer- Swelling   . Sunflower Oil Swelling  . Sulfa Antibiotics Rash   Current Outpatient Prescriptions on File Prior to Visit  Medication Sig Dispense Refill  . allopurinol (ZYLOPRIM) 100 MG tablet Take 100 mg by mouth daily.    . Cinnamon 500 MG capsule Take 500 mg by mouth daily.    . colchicine 0.6 MG tablet Take 0.6 mg by mouth daily.    Marland Kitchen docusate sodium (COLACE) 100 MG capsule Take 1 capsule (100 mg total) by mouth 2 (two) times daily. 10 capsule 0  . doxazosin (CARDURA) 4 MG tablet Take 4 mg by mouth daily. Patient takes in the pm    . furosemide (LASIX) 20 MG tablet Take 20 mg by mouth every Monday, Wednesday, and Friday.    Marland Kitchen HYDROcodone-acetaminophen (NORCO) 7.5-325 MG per tablet Take 1-2 tablets by mouth every 4 (four) hours as needed for moderate pain. 100 tablet 0  . pantoprazole (PROTONIX) 40 MG tablet Take 40 mg by mouth daily.    . polyethylene glycol (MIRALAX / GLYCOLAX) packet Take 17 g by mouth 2 (two) times daily. 14 each 0  . ramipril (ALTACE) 2.5 MG capsule Take 2.5 mg by mouth daily.    . Rivaroxaban (XARELTO STARTER PACK) 15 & 20 MG TBPK Take as directed on package: Start with one 68m tablet by mouth twice a day with food. On Day 22, switch to one 275mtablet once a day with food. 51 each 0  . tiZANidine (ZANAFLEX) 4 MG tablet Take 1 tablet (4 mg total) by mouth every 6 (six) hours as needed for muscle spasms. 30 tablet 0   No current facility-administered medications on file prior to visit.

## 2014-10-04 DIAGNOSIS — M1712 Unilateral primary osteoarthritis, left knee: Secondary | ICD-10-CM | POA: Diagnosis not present

## 2014-10-04 NOTE — Telephone Encounter (Signed)
Called and spoke to pt. Informed pt of the recs per BQ. Pt stated he has enough of the 47m tabs and will continue that dose on day 22. Nothing further needed.

## 2014-10-04 NOTE — Telephone Encounter (Signed)
Yes, I absolutely want him to continue, but on day 22 he should transition to 51m daily Please make sure he has the appropriate Rx to make this change thanks

## 2014-10-06 DIAGNOSIS — M1712 Unilateral primary osteoarthritis, left knee: Secondary | ICD-10-CM | POA: Diagnosis not present

## 2014-10-10 ENCOUNTER — Encounter: Payer: Self-pay | Admitting: Adult Health

## 2014-10-10 ENCOUNTER — Ambulatory Visit (INDEPENDENT_AMBULATORY_CARE_PROVIDER_SITE_OTHER): Payer: Medicare Other | Admitting: Adult Health

## 2014-10-10 VITALS — BP 118/74 | HR 103 | Temp 98.2°F | Ht 73.0 in | Wt 246.0 lb

## 2014-10-10 DIAGNOSIS — J9611 Chronic respiratory failure with hypoxia: Secondary | ICD-10-CM | POA: Insufficient documentation

## 2014-10-10 DIAGNOSIS — Z86711 Personal history of pulmonary embolism: Secondary | ICD-10-CM | POA: Insufficient documentation

## 2014-10-10 DIAGNOSIS — I2699 Other pulmonary embolism without acute cor pulmonale: Secondary | ICD-10-CM

## 2014-10-10 DIAGNOSIS — J9610 Chronic respiratory failure, unspecified whether with hypoxia or hypercapnia: Secondary | ICD-10-CM | POA: Insufficient documentation

## 2014-10-10 MED ORDER — RIVAROXABAN 20 MG PO TABS: 20.0000 mg | ORAL_TABLET | Freq: Every day | ORAL | Status: DC

## 2014-10-10 NOTE — Progress Notes (Signed)
   Subjective:    Patient ID: Joshua [name], male    DOB: Feb 16, 1936, 79 y.o.   MRN: [id number]  HPI 79 year old male former smoker seen for initial pulmonary consultation during hospitalization 09/13/2014 for an acute PE postop after a knee replacement.   10/10/2014 [facility] follow up  Patient presents for a post hospital follow-up He is admitted January 26 through January 30 for total knee replacement. Postop. He developed a pulmonary embolism. CT chest showed a large bilateral pulmonary embolism with RV dilatation. He was treated with heparin and transition to Xarelto Venous Doppler showed no DVT 2-D echo showed an EF of 25-63%, grade 1 diastolic dysfunction Right atrium dilatation and severely increased pulmonary artery pressures around 68 mm Hg  Since discharge. Patient is feeling improved. No dsypnea at rest. Gets winded with exercise Going to PT for knee.  Needs new prescription for 59m daily of Xarelto  He was discharged on Xarelto started packet 15 mg twice daily and transition to 20 mg daily. Finishing up end of starter pack  He denies any chest pain, orthopnea, PND, increased leg swelling or hemoptysis.  Was discharged on 2 L of oxygen. O2 saturations on room air at rest are above 92%. However, walking. He does drop down to 88 % on room air.    Review of Systems Constitutional:   No  weight loss, night sweats,  Fevers, chills, + fatigue, or  lassitude.  HEENT:   No headaches,  Difficulty swallowing,  Tooth/dental problems, or  Sore throat,                No sneezing, itching, ear ache, nasal congestion, post nasal drip,   CV:  No chest pain,  Orthopnea, PND, swelling in lower extremities, anasarca, dizziness, palpitations, syncope.   GI  No heartburn, indigestion, abdominal pain, nausea, vomiting, diarrhea, change in bowel habits, loss of appetite, bloody stools.   Resp: No shortness of breath with exertion or at rest.  No excess mucus, no productive cough,  No  non-productive cough,  No coughing up of blood.  No change in color of mucus.  No wheezing.  No chest wall deformity  Skin: no rash or lesions.  GU: no dysuria, change in color of urine, no urgency or frequency.  No flank pain, no hematuria   MS:  No joint pain or swelling.  No decreased range of motion.  No back pain.  Psych:  No change in mood or affect. No depression or anxiety.  No memory loss.          Objective:   Physical Exam GEN: A/Ox3; pleasant , NAD, elderly   HEENT:  [place]/AT,  EACs-clear, TMs-wnl, NOSE-clear, THROAT-clear, no lesions, no postnasal drip or exudate noted.   NECK:  Supple w/ fair ROM; no JVD; normal carotid impulses w/o bruits; no thyromegaly or nodules palpated; no lymphadenopathy.  RESP  Clear  P & A; w/o, wheezes/ rales/ or rhonchi.no accessory muscle use, no dullness to percussion  CARD:  RRR, no m/r/g  , no peripheral edema, pulses intact, no cyanosis or clubbing.  GI:   Soft & nt; nml bowel sounds; no organomegaly or masses detected.  Musco: Warm bil, no deformities or joint swelling noted.   Neuro: alert, no focal deficits noted.    Skin: Warm, no lesions or rashes         Assessment & Plan:

## 2014-10-10 NOTE — Assessment & Plan Note (Signed)
Acute PE postop after total knee replacement with evidence of right heart strain on echo Patient  will continue Xarelto 20 mg daily Most likely will complete a six-month course d/t right heart strain Will need repeat echo  In future .   Plan  Continue on Xarelto 20 mg daily You have been given a prescription for Xarelto. Continue on oxygen at 2 L with activity and at bedtime. Follow-up with [name] in 6-8 weeks with a PFT.

## 2014-10-10 NOTE — Assessment & Plan Note (Signed)
Most likely due to recent acute PE Patient is a former smoker Continue to desaturate with activity Check PFT on return   Plan  Continue on Xarelto 20 mg daily You have been given a prescription for Xarelto. Continue on oxygen at 2 L with activity and at bedtime. Follow-up with [name] in 6-8 weeks with a PFT.

## 2014-10-10 NOTE — Addendum Note (Signed)
Addended by: Renelda Mom on: 10/10/2014 03:34 PM   Modules accepted: Orders

## 2014-10-10 NOTE — Patient Instructions (Addendum)
Continue on Xarelto 20 mg daily You have been given a prescription for Xarelto. Continue on oxygen at 2 L with activity and at bedtime. Follow-up with [name] in 6-8 weeks with a PFT.

## 2014-10-10 NOTE — Progress Notes (Signed)
Reviewed & agree with plan  

## 2014-10-12 DIAGNOSIS — M1712 Unilateral primary osteoarthritis, left knee: Secondary | ICD-10-CM | POA: Diagnosis not present

## 2014-10-14 DIAGNOSIS — M1712 Unilateral primary osteoarthritis, left knee: Secondary | ICD-10-CM | POA: Diagnosis not present

## 2014-10-17 DIAGNOSIS — M1712 Unilateral primary osteoarthritis, left knee: Secondary | ICD-10-CM | POA: Diagnosis not present

## 2014-10-19 DIAGNOSIS — M1712 Unilateral primary osteoarthritis, left knee: Secondary | ICD-10-CM | POA: Diagnosis not present

## 2014-10-24 DIAGNOSIS — M1712 Unilateral primary osteoarthritis, left knee: Secondary | ICD-10-CM | POA: Diagnosis not present

## 2014-10-26 DIAGNOSIS — M1712 Unilateral primary osteoarthritis, left knee: Secondary | ICD-10-CM | POA: Diagnosis not present

## 2014-10-26 DIAGNOSIS — Z471 Aftercare following joint replacement surgery: Secondary | ICD-10-CM | POA: Diagnosis not present

## 2014-10-26 DIAGNOSIS — Z96652 Presence of left artificial knee joint: Secondary | ICD-10-CM | POA: Diagnosis not present

## 2014-10-31 DIAGNOSIS — M1712 Unilateral primary osteoarthritis, left knee: Secondary | ICD-10-CM | POA: Diagnosis not present

## 2014-11-02 DIAGNOSIS — R972 Elevated prostate specific antigen [PSA]: Secondary | ICD-10-CM | POA: Diagnosis not present

## 2014-11-02 DIAGNOSIS — M1712 Unilateral primary osteoarthritis, left knee: Secondary | ICD-10-CM | POA: Diagnosis not present

## 2014-11-02 DIAGNOSIS — N401 Enlarged prostate with lower urinary tract symptoms: Secondary | ICD-10-CM | POA: Diagnosis not present

## 2014-11-07 DIAGNOSIS — M1712 Unilateral primary osteoarthritis, left knee: Secondary | ICD-10-CM | POA: Diagnosis not present

## 2014-11-09 DIAGNOSIS — R972 Elevated prostate specific antigen [PSA]: Secondary | ICD-10-CM | POA: Diagnosis not present

## 2014-11-10 DIAGNOSIS — M1712 Unilateral primary osteoarthritis, left knee: Secondary | ICD-10-CM | POA: Diagnosis not present

## 2014-11-22 DIAGNOSIS — M1712 Unilateral primary osteoarthritis, left knee: Secondary | ICD-10-CM | POA: Diagnosis not present

## 2014-11-24 DIAGNOSIS — M1712 Unilateral primary osteoarthritis, left knee: Secondary | ICD-10-CM | POA: Diagnosis not present

## 2014-11-29 DIAGNOSIS — M1712 Unilateral primary osteoarthritis, left knee: Secondary | ICD-10-CM | POA: Diagnosis not present

## 2014-12-02 DIAGNOSIS — M1712 Unilateral primary osteoarthritis, left knee: Secondary | ICD-10-CM | POA: Diagnosis not present

## 2014-12-07 DIAGNOSIS — Z471 Aftercare following joint replacement surgery: Secondary | ICD-10-CM | POA: Diagnosis not present

## 2014-12-07 DIAGNOSIS — Z96652 Presence of left artificial knee joint: Secondary | ICD-10-CM | POA: Diagnosis not present

## 2014-12-09 ENCOUNTER — Other Ambulatory Visit: Payer: Self-pay | Admitting: Pulmonary Disease

## 2014-12-09 DIAGNOSIS — R0600 Dyspnea, unspecified: Secondary | ICD-10-CM

## 2014-12-12 ENCOUNTER — Ambulatory Visit (INDEPENDENT_AMBULATORY_CARE_PROVIDER_SITE_OTHER): Payer: Medicare Other | Admitting: Pulmonary Disease

## 2014-12-12 ENCOUNTER — Encounter: Payer: Self-pay | Admitting: Pulmonary Disease

## 2014-12-12 VITALS — BP 120/82 | HR 89 | Ht 74.0 in | Wt 249.0 lb

## 2014-12-12 DIAGNOSIS — R0600 Dyspnea, unspecified: Secondary | ICD-10-CM

## 2014-12-12 DIAGNOSIS — J449 Chronic obstructive pulmonary disease, unspecified: Secondary | ICD-10-CM | POA: Diagnosis not present

## 2014-12-12 DIAGNOSIS — I2699 Other pulmonary embolism without acute cor pulmonale: Secondary | ICD-10-CM | POA: Diagnosis not present

## 2014-12-12 LAB — PULMONARY FUNCTION TEST
DL/VA % pred: 62 %
DL/VA: 3.01 ml/min/mmHg/L
DLCO unc % pred: 35 %
DLCO unc: 13.26 ml/min/mmHg
FEF 25-75 Post: 1.07 L/sec
FEF 25-75 Pre: 1.26 L/sec
FEF2575-%Change-Post: -14 %
FEF2575-%Pred-Post: 41 %
FEF2575-%Pred-Pre: 48 %
FEV1-%Change-Post: -2 %
FEV1-%Pred-Post: 58 %
FEV1-%Pred-Pre: 59 %
FEV1-Post: 1.89 L
FEV1-Pre: 1.93 L
FEV1FVC-%Change-Post: -5 %
FEV1FVC-%Pred-Pre: 93 %
FEV6-%Change-Post: 0 %
FEV6-%Pred-Post: 66 %
FEV6-%Pred-Pre: 66 %
FEV6-Post: 2.74 L
FEV6-Pre: 2.73 L
FEV6FVC-%Change-Post: 0 %
FEV6FVC-%Pred-Post: 103 %
FEV6FVC-%Pred-Pre: 104 %
FVC-%Change-Post: 3 %
FVC-%Pred-Post: 66 %
FVC-%Pred-Pre: 63 %
FVC-Post: 2.85 L
FVC-Pre: 2.75 L
Post FEV1/FVC ratio: 66 %
Post FEV6/FVC ratio: 99 %
Pre FEV1/FVC ratio: 70 %
Pre FEV6/FVC Ratio: 100 %
RV % pred: 93 %
RV: 2.63 L
TLC % pred: 68 %
TLC: 5.41 L

## 2014-12-12 NOTE — Progress Notes (Signed)
PFT done today.

## 2014-12-12 NOTE — Patient Instructions (Signed)
Stay on Xarelto once daily You have moderate COPD - lung capacity is at 68% You will qualify for Pulmonary rehab We can discontinue oxygen

## 2014-12-12 NOTE — Progress Notes (Signed)
Subjective:    Patient ID: Joshua [name], male    DOB: Aug 20, 1935, 79 y.o.   MRN: [id number]  HPI  79 year old male former smoker seen for initial pulmonary consultation during hospitalization 09/13/2014 for an acute PE postop after a knee replacement.  12/12/2014  Chief Complaint  Patient presents with  . Follow-up    PFT today. Discuss going off Oxygen.    He smoked heavily more than 50 pack years before quitting in 2008 He has completed PT for knee.  He is maintained on Xarelto He denies any chest pain, orthopnea, PND, increased leg swelling or hemoptysis.  Was discharged on 2 L of oxygen- he does not desaturate on walking around the office today   Reviewed PFTs  11/2014 - FEV1 59%, no BD response, ratio 70, TLC 68%, DLCO 35%    Significant tests/ events  CT chest 09/15/14 showed a large bilateral pulmonary embolism with RV dilatation. Venous Doppler showed no DVT 2-D echo showed an EF of 97-87%, grade 1 diastolic dysfunction, Right atrium dilatation and severely increased pulmonary artery pressures around 68 mm Hg   Review of Systems neg for any significant sore throat, dysphagia, itching, sneezing, nasal congestion or excess/ purulent secretions, fever, chills, sweats, unintended wt loss, pleuritic or exertional cp, hempoptysis, orthopnea pnd or change in chronic leg swelling. Also denies presyncope, palpitations, heartburn, abdominal pain, nausea, vomiting, diarrhea or change in bowel or urinary habits, dysuria,hematuria, rash, arthralgias, visual complaints, headache, numbness weakness or ataxia.     Objective:   Physical Exam  Gen. Pleasant, obese, in no distress ENT - no lesions, no post nasal drip Neck: No JVD, no thyromegaly, no carotid bruits Lungs: no use of accessory muscles, no dullness to percussion, decreased without rales or rhonchi  Cardiovascular: Rhythm regular, heart sounds  normal, no murmurs or gallops, no peripheral edema Musculoskeletal: No  deformities, no cyanosis or clubbing , no tremors       Assessment & Plan:

## 2014-12-12 NOTE — Assessment & Plan Note (Signed)
Stay on Xarelto once daily Consider rpt e cho then We can discontinue oxygen

## 2014-12-12 NOTE — Assessment & Plan Note (Addendum)
You have moderate COPD - lung capacity is at 68% You will qualify for Pulmonary rehab Since otherwise he is sedentary, I do not feel the need to add long-acting bronchodilators He will use albuterol as needed for dyspnea

## 2014-12-15 ENCOUNTER — Telehealth (HOSPITAL_COMMUNITY): Payer: Self-pay

## 2014-12-15 NOTE — Telephone Encounter (Signed)
I have called and left a message with Joriel to inquire about participation in Pulmonary Rehab per Dr. Bari Mantis referral. Will send letter in mail and follow up.

## 2014-12-16 ENCOUNTER — Telehealth (HOSPITAL_COMMUNITY): Payer: Self-pay

## 2014-12-16 NOTE — Telephone Encounter (Signed)
Called patient regarding entrance to Pulmonary Rehab.  Patient states that they are interested in attending the program.  Joshua [name] is going to verify insurance coverage and follow up.

## 2014-12-19 ENCOUNTER — Telehealth (HOSPITAL_COMMUNITY): Payer: Self-pay

## 2014-12-19 NOTE — Telephone Encounter (Signed)
Patient contacted [place] to let us know he will not be attending Pulmonary Rehab.  When questioned why, he states that he "just doesn't want to." Patient was encouraged to contact us in the future if he ever changes his mind.

## 2015-01-11 DIAGNOSIS — L821 Other seborrheic keratosis: Secondary | ICD-10-CM | POA: Diagnosis not present

## 2015-01-11 DIAGNOSIS — L700 Acne vulgaris: Secondary | ICD-10-CM | POA: Diagnosis not present

## 2015-01-19 DIAGNOSIS — I10 Essential (primary) hypertension: Secondary | ICD-10-CM | POA: Diagnosis not present

## 2015-01-19 DIAGNOSIS — E119 Type 2 diabetes mellitus without complications: Secondary | ICD-10-CM | POA: Diagnosis not present

## 2015-01-26 DIAGNOSIS — E780 Pure hypercholesterolemia: Secondary | ICD-10-CM | POA: Diagnosis not present

## 2015-01-26 DIAGNOSIS — M109 Gout, unspecified: Secondary | ICD-10-CM | POA: Diagnosis not present

## 2015-01-26 DIAGNOSIS — E119 Type 2 diabetes mellitus without complications: Secondary | ICD-10-CM | POA: Diagnosis not present

## 2015-01-26 DIAGNOSIS — I10 Essential (primary) hypertension: Secondary | ICD-10-CM | POA: Diagnosis not present

## 2015-02-13 ENCOUNTER — Other Ambulatory Visit: Payer: Self-pay

## 2015-02-16 ENCOUNTER — Other Ambulatory Visit (INDEPENDENT_AMBULATORY_CARE_PROVIDER_SITE_OTHER): Payer: Medicare Other

## 2015-02-16 ENCOUNTER — Ambulatory Visit (INDEPENDENT_AMBULATORY_CARE_PROVIDER_SITE_OTHER): Payer: Medicare Other | Admitting: Pulmonary Disease

## 2015-02-16 ENCOUNTER — Encounter: Payer: Self-pay | Admitting: Pulmonary Disease

## 2015-02-16 VITALS — BP 100/64 | HR 72 | Ht 75.0 in | Wt 244.0 lb

## 2015-02-16 DIAGNOSIS — I10 Essential (primary) hypertension: Secondary | ICD-10-CM

## 2015-02-16 DIAGNOSIS — I2699 Other pulmonary embolism without acute cor pulmonale: Secondary | ICD-10-CM

## 2015-02-16 DIAGNOSIS — J449 Chronic obstructive pulmonary disease, unspecified: Secondary | ICD-10-CM

## 2015-02-16 DIAGNOSIS — I270 Primary pulmonary hypertension: Secondary | ICD-10-CM | POA: Diagnosis not present

## 2015-02-16 DIAGNOSIS — I272 Pulmonary hypertension, unspecified: Secondary | ICD-10-CM

## 2015-02-16 LAB — CBC WITH DIFFERENTIAL/PLATELET
Basophils Absolute: 0 10*3/uL (ref 0.0–0.1)
Basophils Relative: 0.3 % (ref 0.0–3.0)
Eosinophils Absolute: 0.3 10*3/uL (ref 0.0–0.7)
Eosinophils Relative: 7 % — ABNORMAL HIGH (ref 0.0–5.0)
HCT: 42.1 % (ref 39.0–52.0)
Hemoglobin: 13.7 g/dL (ref 13.0–17.0)
Lymphocytes Relative: 24.5 % (ref 12.0–46.0)
Lymphs Abs: 1.2 10*3/uL (ref 0.7–4.0)
MCHC: 32.6 g/dL (ref 30.0–36.0)
MCV: 83.8 fl (ref 78.0–100.0)
Monocytes Absolute: 0.7 10*3/uL (ref 0.1–1.0)
Monocytes Relative: 14.1 % — ABNORMAL HIGH (ref 3.0–12.0)
Neutro Abs: 2.6 10*3/uL (ref 1.4–7.7)
Neutrophils Relative %: 54.1 % (ref 43.0–77.0)
Platelets: 214 10*3/uL (ref 150.0–400.0)
RBC: 5.03 Mil/uL (ref 4.22–5.81)
RDW: 15.7 % — ABNORMAL HIGH (ref 11.5–15.5)
WBC: 4.9 10*3/uL (ref 4.0–10.5)

## 2015-02-16 NOTE — Assessment & Plan Note (Signed)
He is hypotensive today and feels dizzy. I have asked him to stop the lisinopril-he will recheck his blood pressure the next few days and restart if more than 140/90

## 2015-02-16 NOTE — Progress Notes (Signed)
   Subjective:    Patient ID: ZALE MARCOTTE, male    DOB: 02-27-1936, 79 y.o.   MRN: [id number]  HPI  79 year old male former smoker for follow-up of PE and mild COPD  09/13/2014 admitted for an acute PE postop after a knee replacement.  He smoked heavily more than 50 pack years before quitting in 2008 He is maintained on Xarelto   02/16/2015  Chief Complaint  Patient presents with  . Follow-up    breathing is doing well.  concerned about blood pressure fluctuating too low.  gets dizzy when he bends over.  DOE. how much longer does he need to take Xarelto.   He came off oxygen about one month after his acute episode His blood pressure is low today-and he complains of occasional dizziness-he wonders if Xarelto is causing this. He denies black stools or bruising His dyspnea is much improved, no leg swelling  CBC ok  Significant tests/ events  CT chest 09/15/14 showed a large bilateral pulmonary embolism with RV dilatation. Venous Doppler showed no DVT 2-D echo showed an EF of 45-99%, grade 1 diastolic dysfunction, Right atrium dilatation and severely increased pulmonary artery pressures around 68 mm Hg   PFTs  11/2014 - FEV1 59%, no BD response, ratio 70, TLC 68%, DLCO 35%   Review of Systems neg for any significant sore throat, dysphagia, itching, sneezing, nasal congestion or excess/ purulent secretions, fever, chills, sweats, unintended wt loss, pleuritic or exertional cp, hempoptysis, orthopnea pnd or change in chronic leg swelling. Also denies presyncope, palpitations, heartburn, abdominal pain, nausea, vomiting, diarrhea or change in bowel or urinary habits, dysuria,hematuria, rash, arthralgias, visual complaints, headache, numbness weakness or ataxia.     Objective:   Physical Exam  Gen. Pleasant, well-nourished, in no distress ENT - no lesions, no post nasal drip Neck: No JVD, no thyromegaly, no carotid bruits Lungs: no use of accessory muscles, no dullness to  percussion, clear without rales or rhonchi  Cardiovascular: Rhythm regular, heart sounds  normal, no murmurs or gallops, no peripheral edema Musculoskeletal: No deformities, no cyanosis or clubbing        Assessment & Plan:

## 2015-02-16 NOTE — Assessment & Plan Note (Signed)
Your BP is running low- STOP taking Altace & recheck your BP (goal is less than 140/90) CBC today Echo (to check heart pressures) We can stop xarelto in 40month if all tests ok

## 2015-02-16 NOTE — Assessment & Plan Note (Signed)
Ct albuterol prn

## 2015-02-16 NOTE — Patient Instructions (Signed)
Your BP is running low- STOP taking Altace & recheck your BP (goal is less than 140/90) Blood work today Echo (to check heart pressures) We can stop xarelto in 72month if all tests ok

## 2015-02-23 ENCOUNTER — Other Ambulatory Visit: Payer: Self-pay

## 2015-02-23 ENCOUNTER — Ambulatory Visit (HOSPITAL_COMMUNITY): Payer: Medicare Other | Attending: Cardiovascular Disease

## 2015-02-23 DIAGNOSIS — J449 Chronic obstructive pulmonary disease, unspecified: Secondary | ICD-10-CM | POA: Diagnosis not present

## 2015-02-23 DIAGNOSIS — I10 Essential (primary) hypertension: Secondary | ICD-10-CM | POA: Diagnosis not present

## 2015-02-23 DIAGNOSIS — Z87891 Personal history of nicotine dependence: Secondary | ICD-10-CM | POA: Diagnosis not present

## 2015-02-23 DIAGNOSIS — I272 Pulmonary hypertension, unspecified: Secondary | ICD-10-CM

## 2015-02-23 DIAGNOSIS — I270 Primary pulmonary hypertension: Secondary | ICD-10-CM

## 2015-02-23 DIAGNOSIS — I517 Cardiomegaly: Secondary | ICD-10-CM | POA: Insufficient documentation

## 2015-02-23 DIAGNOSIS — I2699 Other pulmonary embolism without acute cor pulmonale: Secondary | ICD-10-CM

## 2015-02-28 ENCOUNTER — Telehealth: Payer: Self-pay | Admitting: Pulmonary Disease

## 2015-02-28 NOTE — Telephone Encounter (Signed)
Result Note     Right sided heart pressure still high, rpt echo in 87mths    No change in therapy   ---  I spoke with patient about results and he verbalized understanding and had no questions.  Dr. AElsworth Soho do you want to order the echo for 6 months? Also pt wants to know when he is to follow up? thanks

## 2015-03-01 ENCOUNTER — Other Ambulatory Visit: Payer: Self-pay | Admitting: Pulmonary Disease

## 2015-03-01 DIAGNOSIS — I272 Pulmonary hypertension, unspecified: Secondary | ICD-10-CM

## 2015-03-01 DIAGNOSIS — J449 Chronic obstructive pulmonary disease, unspecified: Secondary | ICD-10-CM

## 2015-03-01 DIAGNOSIS — I2699 Other pulmonary embolism without acute cor pulmonale: Secondary | ICD-10-CM

## 2015-03-01 NOTE — Telephone Encounter (Signed)
OK to order echo FU with TP in 107m

## 2015-03-01 NOTE — Telephone Encounter (Signed)
Spoke with pt's wife and informed her that pt needs echo in 6 months and f/u appt with TP in 3 months. Informed her we would put it in for a reminder since schedule not available. Nothing further needed.

## 2015-04-10 ENCOUNTER — Other Ambulatory Visit: Payer: Self-pay | Admitting: Adult Health

## 2015-05-10 ENCOUNTER — Telehealth: Payer: Self-pay | Admitting: Pulmonary Disease

## 2015-05-10 ENCOUNTER — Other Ambulatory Visit: Payer: Self-pay | Admitting: Adult Health

## 2015-05-10 DIAGNOSIS — R972 Elevated prostate specific antigen [PSA]: Secondary | ICD-10-CM | POA: Diagnosis not present

## 2015-05-10 NOTE — Telephone Encounter (Signed)
lmtcb x1--don't see where we called pt.

## 2015-05-11 NOTE — Telephone Encounter (Signed)
I see no calls where pt was contacted by our office.  I did see a recall for October for an ov with TP.  Pt scheduled for appt.  Nothing further needed at this time.

## 2015-05-11 NOTE — Telephone Encounter (Signed)
Pt returned call to nurse [phone]

## 2015-05-11 NOTE — Telephone Encounter (Signed)
lmomtcb x2 for pt

## 2015-05-17 DIAGNOSIS — N401 Enlarged prostate with lower urinary tract symptoms: Secondary | ICD-10-CM | POA: Diagnosis not present

## 2015-05-17 DIAGNOSIS — R351 Nocturia: Secondary | ICD-10-CM | POA: Diagnosis not present

## 2015-05-17 DIAGNOSIS — R972 Elevated prostate specific antigen [PSA]: Secondary | ICD-10-CM | POA: Diagnosis not present

## 2015-05-29 ENCOUNTER — Encounter: Payer: Self-pay | Admitting: Adult Health

## 2015-05-29 ENCOUNTER — Ambulatory Visit (INDEPENDENT_AMBULATORY_CARE_PROVIDER_SITE_OTHER): Payer: Medicare Other | Admitting: Adult Health

## 2015-05-29 VITALS — BP 110/62 | HR 82 | Temp 98.5°F | Ht 75.0 in | Wt 254.0 lb

## 2015-05-29 DIAGNOSIS — Z23 Encounter for immunization: Secondary | ICD-10-CM | POA: Diagnosis not present

## 2015-05-29 DIAGNOSIS — I2699 Other pulmonary embolism without acute cor pulmonale: Secondary | ICD-10-CM

## 2015-05-29 DIAGNOSIS — J449 Chronic obstructive pulmonary disease, unspecified: Secondary | ICD-10-CM

## 2015-05-29 DIAGNOSIS — Z86711 Personal history of pulmonary embolism: Secondary | ICD-10-CM | POA: Diagnosis not present

## 2015-05-29 DIAGNOSIS — I10 Essential (primary) hypertension: Secondary | ICD-10-CM | POA: Diagnosis not present

## 2015-05-29 MED ORDER — RIVAROXABAN 20 MG PO TABS: 20.0000 mg | ORAL_TABLET | Freq: Every day | ORAL | Status: DC

## 2015-05-29 NOTE — Patient Instructions (Addendum)
Continue on Xarelto .  Flu shot today .  follow up for Echo in 3 months as planned  Follow up [name]  In 3 months and As needed

## 2015-05-29 NOTE — Assessment & Plan Note (Signed)
Compensated .  Flu shot today

## 2015-05-29 NOTE — Progress Notes (Signed)
Subjective:    Patient ID: Joshua [name], male    DOB: 03/12/1936, 79 y.o.   MRN: [id number]  HPI 79 year old male former smoker seen for initial pulmonary consultation during hospitalization 09/13/2014 for an acute PE postop after a knee replacement.  TEST :  09/15/14 CT chest showed a large bilateral pulmonary embolism with RV dilatation. 08/2014 Venous Doppler showed no DVT 08/2014 2-D echo showed an EF of 72-76%, grade 1 diastolic dysfunction, Right atrium dilatation and severely increased pulmonary artery pressures around 68 mm Hg    05/29/2015 Follow up : PE  Patient presents for 4 month follow up .  He developed an acute PE s/p  total knee replacement in 08/2014 with right heart strain (elevated PAP on echo). Tx w/ Xarelto. He was last seen in June for follow up doing well.  He was set up for Echo in July that showed mild RA/RV dilation. Suggestive of RV pressure overload  He was continued on Xarelto with plans for repeat echo in 6 months.  He says he is doing well with no dyspnea.  He denies any chest pain, orthopnea, PND, increased leg swelling or hemoptysis.      Review of Systems Constitutional:   No  weight loss, night sweats,  Fevers, chills, + fatigue, or  lassitude.  HEENT:   No headaches,  Difficulty swallowing,  Tooth/dental problems, or  Sore throat,                No sneezing, itching, ear ache, nasal congestion, post nasal drip,   CV:  No chest pain,  Orthopnea, PND, swelling in lower extremities, anasarca, dizziness, palpitations, syncope.   GI  No heartburn, indigestion, abdominal pain, nausea, vomiting, diarrhea, change in bowel habits, loss of appetite, bloody stools.   Resp: No shortness of breath with exertion or at rest.  No excess mucus, no productive cough,  No non-productive cough,  No coughing up of blood.  No change in color of mucus.  No wheezing.  No chest wall deformity  Skin: no rash or lesions.  GU: no dysuria, change in color of urine, no  urgency or frequency.  No flank pain, no hematuria   MS:  No joint pain or swelling.  No decreased range of motion.  No back pain.  Psych:  No change in mood or affect. No depression or anxiety.  No memory loss.          Objective:   Physical Exam GEN: A/Ox3; pleasant , NAD, elderly   HEENT:  [place]/AT,  EACs-clear, TMs-wnl, NOSE-clear, THROAT-clear, no lesions, no postnasal drip or exudate noted.   NECK:  Supple w/ fair ROM; no JVD; normal carotid impulses w/o bruits; no thyromegaly or nodules palpated; no lymphadenopathy.  RESP  Clear  P & A; w/o, wheezes/ rales/ or rhonchi.no accessory muscle use, no dullness to percussion  CARD:  RRR, no m/r/g  , no peripheral edema, pulses intact, no cyanosis or clubbing.  GI:   Soft & nt; nml bowel sounds; no organomegaly or masses detected.  Musco: Warm bil, no deformities or joint swelling noted.   Neuro: alert, no focal deficits noted.    Skin: Warm, no lesions or rashes         Assessment & Plan:

## 2015-05-29 NOTE — Assessment & Plan Note (Signed)
[place] PE in 08/2014 s/p knee surgery w/ evidence of right heart strain (PAP increased on echo)  Pt remains on xarelto and doing well.  Repeat echo in July suggested persistent RV pressure overload.  He will continue on Xarelto and recheck echo in 3 months  follow up [name]  In 3 months and As needed

## 2015-05-30 NOTE — Progress Notes (Signed)
Reviewed & agree with plan

## 2015-06-06 ENCOUNTER — Other Ambulatory Visit: Payer: Self-pay | Admitting: Adult Health

## 2015-07-24 DIAGNOSIS — I10 Essential (primary) hypertension: Secondary | ICD-10-CM | POA: Diagnosis not present

## 2015-07-24 DIAGNOSIS — E119 Type 2 diabetes mellitus without complications: Secondary | ICD-10-CM | POA: Diagnosis not present

## 2015-07-24 DIAGNOSIS — M109 Gout, unspecified: Secondary | ICD-10-CM | POA: Diagnosis not present

## 2015-07-27 DIAGNOSIS — E119 Type 2 diabetes mellitus without complications: Secondary | ICD-10-CM | POA: Diagnosis not present

## 2015-07-27 DIAGNOSIS — I10 Essential (primary) hypertension: Secondary | ICD-10-CM | POA: Diagnosis not present

## 2015-07-27 DIAGNOSIS — E7800 Pure hypercholesterolemia, unspecified: Secondary | ICD-10-CM | POA: Diagnosis not present

## 2015-07-27 DIAGNOSIS — M109 Gout, unspecified: Secondary | ICD-10-CM | POA: Diagnosis not present

## 2015-08-29 ENCOUNTER — Other Ambulatory Visit (HOSPITAL_COMMUNITY): Payer: Medicare Other | Admitting: *Deleted

## 2015-08-29 ENCOUNTER — Other Ambulatory Visit: Payer: Self-pay

## 2015-08-29 ENCOUNTER — Ambulatory Visit (HOSPITAL_COMMUNITY): Payer: Medicare Other | Attending: Cardiology

## 2015-08-29 DIAGNOSIS — I517 Cardiomegaly: Secondary | ICD-10-CM | POA: Insufficient documentation

## 2015-08-29 DIAGNOSIS — J449 Chronic obstructive pulmonary disease, unspecified: Secondary | ICD-10-CM

## 2015-08-29 DIAGNOSIS — I10 Essential (primary) hypertension: Secondary | ICD-10-CM | POA: Diagnosis not present

## 2015-08-29 DIAGNOSIS — I371 Nonrheumatic pulmonary valve insufficiency: Secondary | ICD-10-CM | POA: Diagnosis not present

## 2015-08-29 DIAGNOSIS — Z87891 Personal history of nicotine dependence: Secondary | ICD-10-CM | POA: Insufficient documentation

## 2015-08-29 DIAGNOSIS — I2699 Other pulmonary embolism without acute cor pulmonale: Secondary | ICD-10-CM | POA: Diagnosis present

## 2015-08-29 DIAGNOSIS — I77810 Thoracic aortic ectasia: Secondary | ICD-10-CM | POA: Diagnosis not present

## 2015-08-29 DIAGNOSIS — Z86711 Personal history of pulmonary embolism: Secondary | ICD-10-CM | POA: Diagnosis not present

## 2015-08-29 MED ORDER — PERFLUTREN LIPID MICROSPHERE
1.0000 mL | INTRAVENOUS | Status: AC | PRN
  Administered 2015-08-29: 1 mL via INTRAVENOUS

## 2015-08-30 ENCOUNTER — Other Ambulatory Visit: Payer: Self-pay | Admitting: Adult Health

## 2015-08-30 DIAGNOSIS — Z471 Aftercare following joint replacement surgery: Secondary | ICD-10-CM | POA: Diagnosis not present

## 2015-08-30 DIAGNOSIS — Z96652 Presence of left artificial knee joint: Secondary | ICD-10-CM | POA: Diagnosis not present

## 2015-08-31 DIAGNOSIS — Z961 Presence of intraocular lens: Secondary | ICD-10-CM | POA: Diagnosis not present

## 2015-08-31 NOTE — Progress Notes (Signed)
Quick Note:  LVM for pt to return call. ______

## 2015-09-01 ENCOUNTER — Telehealth: Payer: Self-pay | Admitting: Adult Health

## 2015-09-01 NOTE — Telephone Encounter (Signed)
Notes Recorded by Melvenia Needles, NP on 08/31/2015 at 11:32 AM Echo shows PAP has returned to normal  Still has some mild dilation of LA/RAand mod dilation of RV -stable  Cont w/ current regimen , will discuss more at ov with [name] In March.  No changes for now --------------------- Spoke with pt, aware of echo results.  Nothing further needed.

## 2015-11-06 ENCOUNTER — Ambulatory Visit (INDEPENDENT_AMBULATORY_CARE_PROVIDER_SITE_OTHER): Payer: Medicare Other | Admitting: Pulmonary Disease

## 2015-11-06 ENCOUNTER — Encounter: Payer: Self-pay | Admitting: Pulmonary Disease

## 2015-11-06 VITALS — BP 134/86 | HR 82 | Ht 75.0 in | Wt 267.0 lb

## 2015-11-06 DIAGNOSIS — J449 Chronic obstructive pulmonary disease, unspecified: Secondary | ICD-10-CM

## 2015-11-06 DIAGNOSIS — I2699 Other pulmonary embolism without acute cor pulmonale: Secondary | ICD-10-CM | POA: Diagnosis not present

## 2015-11-06 NOTE — Assessment & Plan Note (Signed)
No meds required

## 2015-11-06 NOTE — Assessment & Plan Note (Signed)
STOP taking xarelto Take baby aspirin daily instead Call us for unexplained shortness of breath

## 2015-11-06 NOTE — Progress Notes (Signed)
   Subjective:    Patient ID: PHENG PROKOP, male    DOB: Nov 13, 1935, 80 y.o.   MRN: [id number]  HPI  80 year old male former smoker seen for initial pulmonary consultation during hospitalization 09/13/2014 for an acute PE postop after a knee replacement.  11/06/2015  Chief Complaint  Patient presents with  . Follow-up    Breathing doing well.  Discuss Echo results from January.  Wants to know how much longer he needs to take Xarelto.  CAT Score: 11   Breathing ok No CP, dyspnea, leg swelling No issues with xarelto   TEST :  09/15/14 CT chest showed a large bilateral pulmonary embolism with RV dilatation. 08/2014 Venous Doppler showed no DVT 08/2014 2-D echo showed an EF of 57-47%, grade 1 diastolic dysfunction, Right atrium dilatation and severely increased pulmonary artery pressures around 68 mm Hg   PFTs 11/2014 - FEV1 59%, no BD response, ratio 70, TLC 68%, DLCO 35%  Echo 08/2015 slight dilated RV     Review of Systems Patient denies significant dyspnea,cough, hemoptysis,  chest pain, palpitations, pedal edema, orthopnea, paroxysmal nocturnal dyspnea, lightheadedness, nausea, vomiting, abdominal or  leg pains      Objective:   Physical Exam  Gen. Pleasant, well-nourished, in no distress ENT - no lesions, no post nasal drip Neck: No JVD, no thyromegaly, no carotid bruits Lungs: no use of accessory muscles, no dullness to percussion, clear without rales or rhonchi  Cardiovascular: Rhythm regular, heart sounds  normal, no murmurs or gallops, no peripheral edema Musculoskeletal: No deformities, no cyanosis or clubbing        Assessment & Plan:

## 2015-11-06 NOTE — Patient Instructions (Signed)
STOP taking xarelto Take baby aspirin daily instead Call us for unexplained shortness of breath

## 2016-01-23 DIAGNOSIS — E7800 Pure hypercholesterolemia, unspecified: Secondary | ICD-10-CM | POA: Diagnosis not present

## 2016-01-23 DIAGNOSIS — I2699 Other pulmonary embolism without acute cor pulmonale: Secondary | ICD-10-CM | POA: Diagnosis not present

## 2016-01-23 DIAGNOSIS — E119 Type 2 diabetes mellitus without complications: Secondary | ICD-10-CM | POA: Diagnosis not present

## 2016-01-23 DIAGNOSIS — Z79899 Other long term (current) drug therapy: Secondary | ICD-10-CM | POA: Diagnosis not present

## 2016-01-23 DIAGNOSIS — I10 Essential (primary) hypertension: Secondary | ICD-10-CM | POA: Diagnosis not present

## 2016-01-23 DIAGNOSIS — R609 Edema, unspecified: Secondary | ICD-10-CM | POA: Diagnosis not present

## 2016-01-23 DIAGNOSIS — M109 Gout, unspecified: Secondary | ICD-10-CM | POA: Diagnosis not present

## 2016-01-30 DIAGNOSIS — M109 Gout, unspecified: Secondary | ICD-10-CM | POA: Diagnosis not present

## 2016-01-30 DIAGNOSIS — I2699 Other pulmonary embolism without acute cor pulmonale: Secondary | ICD-10-CM | POA: Diagnosis not present

## 2016-01-30 DIAGNOSIS — R739 Hyperglycemia, unspecified: Secondary | ICD-10-CM | POA: Diagnosis not present

## 2016-01-30 DIAGNOSIS — I10 Essential (primary) hypertension: Secondary | ICD-10-CM | POA: Diagnosis not present

## 2016-05-20 DIAGNOSIS — N400 Enlarged prostate without lower urinary tract symptoms: Secondary | ICD-10-CM | POA: Diagnosis not present

## 2016-05-20 DIAGNOSIS — R972 Elevated prostate specific antigen [PSA]: Secondary | ICD-10-CM | POA: Diagnosis not present

## 2016-06-17 DIAGNOSIS — R600 Localized edema: Secondary | ICD-10-CM | POA: Diagnosis not present

## 2016-07-02 DIAGNOSIS — R600 Localized edema: Secondary | ICD-10-CM | POA: Diagnosis not present

## 2016-08-14 DIAGNOSIS — I10 Essential (primary) hypertension: Secondary | ICD-10-CM | POA: Diagnosis not present

## 2016-08-14 DIAGNOSIS — R972 Elevated prostate specific antigen [PSA]: Secondary | ICD-10-CM | POA: Diagnosis not present

## 2016-08-14 DIAGNOSIS — R739 Hyperglycemia, unspecified: Secondary | ICD-10-CM | POA: Diagnosis not present

## 2016-08-14 DIAGNOSIS — M109 Gout, unspecified: Secondary | ICD-10-CM | POA: Diagnosis not present

## 2016-08-14 DIAGNOSIS — R609 Edema, unspecified: Secondary | ICD-10-CM | POA: Diagnosis not present

## 2016-08-22 DIAGNOSIS — R739 Hyperglycemia, unspecified: Secondary | ICD-10-CM | POA: Diagnosis not present

## 2016-08-22 DIAGNOSIS — I10 Essential (primary) hypertension: Secondary | ICD-10-CM | POA: Diagnosis not present

## 2016-08-22 DIAGNOSIS — M109 Gout, unspecified: Secondary | ICD-10-CM | POA: Diagnosis not present

## 2016-08-22 DIAGNOSIS — Z0000 Encounter for general adult medical examination without abnormal findings: Secondary | ICD-10-CM | POA: Diagnosis not present

## 2016-10-27 ENCOUNTER — Encounter (HOSPITAL_COMMUNITY): Payer: Self-pay | Admitting: Emergency Medicine

## 2016-10-27 ENCOUNTER — Emergency Department (HOSPITAL_COMMUNITY)
Admission: EM | Admit: 2016-10-27 | Discharge: 2016-10-27 | Disposition: A | Discharge status: Home / Self Care | Payer: Medicare Other | Attending: Emergency Medicine | Admitting: Emergency Medicine

## 2016-10-27 DIAGNOSIS — J449 Chronic obstructive pulmonary disease, unspecified: Secondary | ICD-10-CM | POA: Diagnosis not present

## 2016-10-27 DIAGNOSIS — Z79899 Other long term (current) drug therapy: Secondary | ICD-10-CM | POA: Diagnosis not present

## 2016-10-27 DIAGNOSIS — Z96652 Presence of left artificial knee joint: Secondary | ICD-10-CM | POA: Insufficient documentation

## 2016-10-27 DIAGNOSIS — Z87891 Personal history of nicotine dependence: Secondary | ICD-10-CM | POA: Insufficient documentation

## 2016-10-27 DIAGNOSIS — I10 Essential (primary) hypertension: Secondary | ICD-10-CM | POA: Diagnosis not present

## 2016-10-27 DIAGNOSIS — R339 Retention of urine, unspecified: Secondary | ICD-10-CM | POA: Diagnosis not present

## 2016-10-27 DIAGNOSIS — R338 Other retention of urine: Secondary | ICD-10-CM

## 2016-10-27 HISTORY — DX: Chronic obstructive pulmonary disease, unspecified: J44.9

## 2016-10-27 LAB — URINALYSIS, ROUTINE W REFLEX MICROSCOPIC
Bacteria, UA: NONE SEEN
Bilirubin Urine: NEGATIVE
Glucose, UA: NEGATIVE mg/dL
Ketones, ur: NEGATIVE mg/dL
Leukocytes, UA: NEGATIVE
Nitrite: NEGATIVE
Protein, ur: NEGATIVE mg/dL
Specific Gravity, Urine: 1.006 (ref 1.005–1.030)
Squamous Epithelial / LPF: NONE SEEN
pH: 5 (ref 5.0–8.0)

## 2016-10-27 NOTE — ED Notes (Signed)
ED Provider at bedside. 

## 2016-10-27 NOTE — ED Provider Notes (Signed)
[facility] DEPT Provider Note   CSN: [id number] Arrival date & time: 10/27/16 2231     History    Chief Complaint  Patient presents with  . Urinary Retention     HPI Joshua [name] is a 81 y.o. male.  81yo M w/ PMH below who p/w urinary retention. Patient states that he was having some mild difficulty urinating yesterday and today with voiding small amounts of urine. For the past several hours, he has been unable to produce any urine despite feeling like he needs to void. He denies any associated hematuria, fevers, vomiting, or recent illness. He had a problem with urinary retention requiring foley several years ago but improved and has not had problems since then.  Of note, he has a history of COPD and was previously on home oxygen but was taken off and has not been on oxygen anytime recently. He denies any breathing problems or recent respiratory complaints.   Past Medical History:  Diagnosis Date  . Arthritis   . COPD (chronic obstructive pulmonary disease) ([place])   . Folliculitis    posterior scalp per office visit note of Dr Maudie Mercury 07/20/2014    . GERD (gastroesophageal reflux disease)   . Hypertension   . Phlebitis    right arm  at least 20 years ago   . Pneumonia    hx of pneumonia as a child      Patient Active Problem List   Diagnosis Date Noted  . Essential hypertension 02/16/2015  . COPD (chronic obstructive pulmonary disease) ([place]) 12/12/2014  . Acute pulmonary embolism ([place]) 10/10/2014  . S/P knee replacement 09/13/2014    Past Surgical History:  Procedure Laterality Date  . bone removed from little toe right foot     . CHOLECYSTECTOMY    . pilonidal cyst removal     . TOTAL KNEE ARTHROPLASTY Left 09/13/2014   Procedure: LEFT TOTAL KNEE ARTHROPLASTY;  Surgeon: Mauri Pole, MD;  Location: [facility] ORS;  Service: Orthopedics;  Laterality: Left;        Home Medications    Prior to Admission medications   Medication Sig Start Date End Date Taking?  Authorizing Provider  allopurinol (ZYLOPRIM) 100 MG tablet Take 100 mg by mouth daily.    Historical Provider, MD  Cinnamon 500 MG capsule Take 500 mg by mouth daily.    Historical Provider, MD  doxazosin (CARDURA) 4 MG tablet Take 4 mg by mouth daily. Patient takes in the pm    Historical Provider, MD  furosemide (LASIX) 20 MG tablet Take 20 mg by mouth every Monday, Wednesday, and Friday.    Historical Provider, MD  pantoprazole (PROTONIX) 40 MG tablet Take 40 mg by mouth daily.    Historical Provider, MD  ramipril (ALTACE) 2.5 MG capsule Take 2.5 mg by mouth daily.    Historical Provider, MD      No family history on file.   Social History  Substance Use Topics  . Smoking status: Former Smoker    Packs/day: 1.00    Years: 50.00    Types: Cigarettes    Quit date: 08/19/2006  . Smokeless tobacco: Never Used  . Alcohol use 3.6 oz/week    6 Shots of liquor per week     Allergies     Other; Sunflower oil; and Sulfa antibiotics    Review of Systems  10 Systems reviewed and are negative for acute change except as noted in the HPI.   Physical Exam Updated Vital Signs BP 156/93 (  BP Location: Right Arm)   Pulse 99   Temp 97.9 F (36.6 C) (Oral)   Resp 20   SpO2 (!) 87%   Physical Exam  Constitutional: He is oriented to person, place, and time. He appears well-developed and well-nourished. No distress.  HENT:  Head: Normocephalic and atraumatic.  Eyes: Conjunctivae are normal. Pupils are equal, round, and reactive to light.  Neck: Neck supple.  Cardiovascular: Normal rate, regular rhythm and normal heart sounds.   No murmur heard. Pulmonary/Chest: Effort normal and breath sounds normal.  Abdominal: Soft. Bowel sounds are normal. He exhibits no distension. There is no tenderness.  Genitourinary:  Genitourinary Comments: Foley catheter in place draining clear yellow urine  Musculoskeletal: He exhibits no tenderness.  Neurological: He is alert and oriented to person,  place, and time.  Fluent speech  Skin: Skin is warm and dry.  Psychiatric: He has a normal mood and affect. Judgment normal.  Nursing note and vitals reviewed.     ED Treatments / Results  Labs (all labs ordered are listed, but only abnormal results are displayed) Labs Reviewed  URINALYSIS, ROUTINE W REFLEX MICROSCOPIC     EKG  EKG Interpretation  Date/Time:    Ventricular Rate:    PR Interval:    QRS Duration:   QT Interval:    QTC Calculation:   R Axis:     Text Interpretation:           Radiology No results found.  Procedures Procedures (including critical care time) Procedures  Medications Ordered in ED  Medications - No data to display   Initial Impression / Assessment and Plan / ED Course  I have reviewed the triage vital signs and the nursing notes.     PT p/w acute urinary retention starting this afternoon. On my exam, nurse had already placed foley with >1049m urine output, clear yellow urine in bag. Pt comfortable with no complaints. Incidentally noted mildly low O2 sats 88-90%, was on home O2 previously but not recently. Started to follow-up with both his urologist and pulmonologist but given that he denies any respiratory complaints and do not feel he needs any further workup at this time. I have extensively reviewed return precautions. Patient voiced understanding and was discharged in satisfactory condition.  Final Clinical Impressions(s) / ED Diagnoses   Final diagnoses:  Acute urinary retention     New Prescriptions   No medications on file       RSharlett Iles MD 10/27/16 2317

## 2016-10-27 NOTE — ED Triage Notes (Signed)
Pt reports difficulty urinating all day but was able to void a little, however not able to void any for past few hours.   Pt has COPD and reports recently taken off continuous 02.

## 2016-11-06 DIAGNOSIS — R339 Retention of urine, unspecified: Secondary | ICD-10-CM | POA: Diagnosis not present

## 2016-11-07 DIAGNOSIS — R339 Retention of urine, unspecified: Secondary | ICD-10-CM | POA: Diagnosis not present

## 2016-11-11 ENCOUNTER — Ambulatory Visit (INDEPENDENT_AMBULATORY_CARE_PROVIDER_SITE_OTHER): Payer: Medicare Other | Admitting: Acute Care

## 2016-11-11 ENCOUNTER — Encounter: Payer: Self-pay | Admitting: Acute Care

## 2016-11-11 DIAGNOSIS — J449 Chronic obstructive pulmonary disease, unspecified: Secondary | ICD-10-CM

## 2016-11-11 DIAGNOSIS — I2609 Other pulmonary embolism with acute cor pulmonale: Secondary | ICD-10-CM | POA: Diagnosis not present

## 2016-11-11 MED ORDER — FLUTICASONE FUROATE-VILANTEROL 100-25 MCG/INH IN AEPB: 1.0000 | INHALATION_SPRAY | Freq: Every day | RESPIRATORY_TRACT | 0 refills | Status: DC

## 2016-11-11 MED ORDER — ALBUTEROL SULFATE HFA 108 (90 BASE) MCG/ACT IN AERS: 1.0000 | INHALATION_SPRAY | Freq: Four times a day (QID) | RESPIRATORY_TRACT | 3 refills | Status: DC | PRN

## 2016-11-11 NOTE — Patient Instructions (Addendum)
It is nice to meet you today. We will walk you today in the office . You dropped your oxygen saturations while walking to 87%. This does qualify you for home oxygen. We will start Breo 100/25. One Puff once daily. This is a maintenance inhaler.  Use this every day without fail.  Rinse your mouth after use. We will prescribe you a rescue inhaler. This is also known as Albuterol. Use 1 -2 puffs as needed for shortness of breath or wheezing up to every 6 hours. Continue Baby Aspirin Daily Call the office for any unexplained shortness of breath. Follow up appointment with [name] in 2  months. Please contact office for sooner follow up if symptoms do not improve or worsen or seek emergency care

## 2016-11-11 NOTE — Assessment & Plan Note (Addendum)
PE 09/13/2014 post total knee replacement positive for heart strain Treated with Xarelto through March 2017 Xaralto discontinued 11/06/2015 with addition of daily baby aspirin at that time Patient instructed to return to hospital or office for any unexplained shortness of breath.

## 2016-11-11 NOTE — Progress Notes (Addendum)
History of Present Illness Joshua [name] is a 81 y.o. male former smoker with COPD, and history of Acute PE 09/13/2014 after knee replacement.He is followed by [name].   11/11/2016 Acute OV: Hypoxemia: Pt. Presents today for follow up of an ED visit.Marland KitchenHe recently had to go to the ED on 3/11/ 18  for Urinary retention and his oxygen saturation was 89% upon check there.Marland Kitchen He was told to follow up with Pulmonary.  He was last seen in this office 10/2015  for treatment of PE  after knee replacement 08/2014. He was treated with Xarelto x 1 year and [name] stopped medication  and started patient on a baby aspirin daily. He has instructions to follow up for any sudden shortness of breath. He states he has been doing well. No breathing issues. He states he was so uncomfortable due to his urinary retention on 10/27/2016 he feels it was affecting his breathing, and is the reason for his low oxygen saturation in the emergency department. He currently has no complaints. He has started self-catheterization so urinary retention should no longer be a problem. At last office visit patient was not on any maintenance or when necessary medication for his COPD. He denies fever, chest pain, orthopnea, or hemoptysis. He denies any ongoing or acute dyspnea. No recent automobile or airline travel.  Tests 09/15/14 CT chest showed a large bilateral pulmonary embolism with RV dilatation. 08/2014 Venous Doppler showed no DVT 08/2014 2-D echo showed an EF of 52-77%, grade 1 diastolic dysfunction, Right atrium dilatation and severely increased pulmonary artery pressures around 68 mm Hg   PFTs 11/2014 - FEV1 59%, no BD response, ratio 70, TLC 68%, DLCO 35% Echo 08/2015 slight dilated RV   Past medical hx Past Medical History:  Diagnosis Date  . Arthritis   . COPD (chronic obstructive pulmonary disease) ([place])   . Folliculitis    posterior scalp per office visit note of Dr Maudie Mercury 07/20/2014    . GERD (gastroesophageal reflux  disease)   . Hypertension   . Phlebitis    right arm  at least 20 years ago   . Pneumonia    hx of pneumonia as a child      Past surgical hx, Family hx, Social hx all reviewed.  Current Outpatient Prescriptions on File Prior to Visit  Medication Sig  . allopurinol (ZYLOPRIM) 100 MG tablet Take 100 mg by mouth daily.  . Cinnamon 500 MG capsule Take 500 mg by mouth daily.  Marland Kitchen doxazosin (CARDURA) 4 MG tablet Take 4 mg by mouth daily. Patient takes in the pm  . furosemide (LASIX) 20 MG tablet Take 20 mg by mouth every Monday, Wednesday, and Friday.  . pantoprazole (PROTONIX) 40 MG tablet Take 40 mg by mouth daily.  . ramipril (ALTACE) 2.5 MG capsule Take 2.5 mg by mouth daily.   No current facility-administered medications on file prior to visit.      Allergies  Allergen Reactions  . Other     Beer- Swelling   . Sunflower Oil Swelling  . Sulfa Antibiotics Rash    Review Of Systems:  Constitutional:   No  weight loss, night sweats,  Fevers, chills, fatigue, or  lassitude.  HEENT:   No headaches,  Difficulty swallowing,  Tooth/dental problems, or  Sore throat,                No sneezing, itching, ear ache, nasal congestion, post nasal drip,   CV:  No chest pain,  Orthopnea, PND, swelling in lower extremities, anasarca, dizziness, palpitations, syncope.   GI  No heartburn, indigestion, abdominal pain, nausea, vomiting, diarrhea, change in bowel habits, loss of appetite, bloody stools.   Resp: + Slight  shortness of breath with exertion not  at rest.  No excess mucus, no productive cough,  No non-productive cough,  No coughing up of blood.  No change in color of mucus.  No wheezing.  No chest wall deformity  Skin: no rash or lesions.  GU: no dysuria, change in color of urine, + retention, no frequency.  No flank pain, no hematuria. Pt. Is self cathing.   MS:  No joint pain or swelling.  No decreased range of motion.  No back pain.  Psych:  No change in mood or affect. No  depression or anxiety.  No memory loss.   Vital Signs BP 140/80 (BP Location: Left Arm, Patient Position: Sitting, Cuff Size: Large)   Pulse 78   Ht _0  (1.905 m)   Wt 273 lb 9.6 oz (124.1 kg)   SpO2 95%   BMI 34.20 kg/m    Physical Exam:  General- No distress,  A&Ox3, pleasant ENT: No sinus tenderness, TM clear, pale nasal mucosa, no oral exudate,no post nasal drip, no LAN Cardiac: S1, S2, regular rate and rhythm, no murmur Chest: No wheeze/ rales/ dullness; no accessory muscle use, no nasal flaring, no sternal retractions Abd.: Soft Non-tender, obese Ext: No clubbing cyanosis, edema Neuro:  normal strength Skin: No rashes, warm and dry Psych: normal mood and behavior   Assessment/Plan  COPD (chronic obstructive pulmonary disease) PFTs 11/2014 - FEV1 59%, no BD response, ratio 70, TLC 68%, DLCO 35%  Patient with oxygen saturation of 89% on room air noted at recent ED visit. Patient without complaints of dyspnea or shortness of breath except with extreme exertion. Patient currently on no maintenance medication for when necessary medication for his COPD Plan Ambulatory saturation  today in the office indicated desaturations to 87%. Discussed with patient that he qualifies for home oxygen. Patient states he does not want oxygen. Risks of untreated COPD/hypoxemia discussed with patient. Agreed to start Brio one puff daily Agreed to addition of rescue inhaler to regimen Patient instructed on use of both above inhalers. Instructed to rinse mouth after use Follow-up in 2 months with [name] to evaluate effectiveness of maintenance and rescue inhaler usage. Reevaluate saturation in 2 months to ensure saturations greater than 88% Reevaluate need for home oxygen based on oxygen saturations after maintenance inhaler and rescue inhaler use is initiated.  Acute pulmonary embolism PE 09/13/2014 post total knee replacement positive for heart strain Treated with Xarelto through  March 2017 Xaralto discontinued 11/06/2015 with addition of daily baby aspirin at that time Patient instructed to return to hospital or office for any unexplained shortness of breath.    Magdalen Spatz, NP 11/11/2016  1:52 PM

## 2016-11-11 NOTE — Assessment & Plan Note (Signed)
PFTs 11/2014 - FEV1 59%, no BD response, ratio 70, TLC 68%, DLCO 35%  Patient with oxygen saturation of 89% on room air noted at recent ED visit. Patient without complaints of dyspnea or shortness of breath except with extreme exertion. Patient currently on no maintenance medication for when necessary medication for his COPD Plan Ambulatory saturation  today in the office indicated desaturations to 87%. Discussed with patient that he qualifies for home oxygen. Patient states he does not want oxygen. Risks of untreated COPD/hypoxemia discussed with patient. Agreed to start Brio one puff daily Agreed to addition of rescue inhaler to regimen Patient instructed on use of both above inhalers. Instructed to rinse mouth after use Follow-up in 2 months with [name] to evaluate effectiveness of maintenance and rescue inhaler usage. Reevaluate saturation in 2 months to ensure saturations greater than 88% Reevaluate need for home oxygen based on oxygen saturations after maintenance inhaler and rescue inhaler use is initiated.

## 2016-11-11 NOTE — Progress Notes (Signed)
Patient seen in the office today and instructed on use of Breo 100.  Patient expressed understanding and demonstrated technique.

## 2016-12-17 DIAGNOSIS — R339 Retention of urine, unspecified: Secondary | ICD-10-CM | POA: Diagnosis not present

## 2016-12-23 DIAGNOSIS — R972 Elevated prostate specific antigen [PSA]: Secondary | ICD-10-CM | POA: Diagnosis not present

## 2016-12-23 DIAGNOSIS — N400 Enlarged prostate without lower urinary tract symptoms: Secondary | ICD-10-CM | POA: Diagnosis not present

## 2017-01-02 DIAGNOSIS — J449 Chronic obstructive pulmonary disease, unspecified: Secondary | ICD-10-CM | POA: Diagnosis not present

## 2017-01-02 DIAGNOSIS — R05 Cough: Secondary | ICD-10-CM | POA: Diagnosis not present

## 2017-01-02 DIAGNOSIS — Z0000 Encounter for general adult medical examination without abnormal findings: Secondary | ICD-10-CM | POA: Diagnosis not present

## 2017-01-02 DIAGNOSIS — M109 Gout, unspecified: Secondary | ICD-10-CM | POA: Diagnosis not present

## 2017-01-02 DIAGNOSIS — I10 Essential (primary) hypertension: Secondary | ICD-10-CM | POA: Diagnosis not present

## 2017-01-09 DIAGNOSIS — J449 Chronic obstructive pulmonary disease, unspecified: Secondary | ICD-10-CM | POA: Diagnosis not present

## 2017-01-09 DIAGNOSIS — R05 Cough: Secondary | ICD-10-CM | POA: Diagnosis not present

## 2017-02-03 ENCOUNTER — Encounter: Payer: Self-pay | Admitting: Acute Care

## 2017-02-03 ENCOUNTER — Ambulatory Visit (INDEPENDENT_AMBULATORY_CARE_PROVIDER_SITE_OTHER): Payer: Medicare Other | Admitting: Acute Care

## 2017-02-03 ENCOUNTER — Ambulatory Visit: Payer: Medicare Other | Admitting: Pulmonary Disease

## 2017-02-03 DIAGNOSIS — J449 Chronic obstructive pulmonary disease, unspecified: Secondary | ICD-10-CM

## 2017-02-03 NOTE — Assessment & Plan Note (Addendum)
Stable interval No significant improvement with Breo, Trelegy of Anoro Plan: Continue using your rescue inhaler as needed for shortness of breath or wheezing Stop Breo as it did not help you. Remember to hold onto counter or chair when you bend over. Follow up with PCP regarding management of blood pressure. Remember to drink plenty of water with summer heat. Follow up with [name] in 12 months. Please contact office for sooner follow up if symptoms do not improve or worsen or seek emergency care

## 2017-02-03 NOTE — Progress Notes (Signed)
History of Present Illness Joshua [name] is a 81 y.o. male former  Smoker ( Quit 11 years ago) with COPD, and history of Acute PE 09/13/2014 after knee replacement.He is followed by [name].   02/03/2017 Follow up after therapeutic trial with Breo. Pt states he had no significant change while on Breo. His PCP also had tried  him on ,Trelegy, which he states didn't really make any difference to him. He has also tried Anoro, which he states did not help. He states he is doing well.He does have some mild dyspnea with a lot of exercise or work which resolves with rest. .He is  otherwise ok  He denies chest pain, orthopnea , hemoptysis. He denies leg or calf pain. He does state he has some rare light headedness with bending and standing suddenly.   Test Results:  09/15/14 CT chest showed a large bilateral pulmonary embolism with RV dilatation. 08/2014 Venous Doppler showed no DVT 08/2014 2-D echo showed an EF of 75-64%, grade 1 diastolic dysfunction, Right atrium dilatation and severely increased pulmonary artery pressures around 68 mm Hg   PFTs 11/2014 - FEV1 59%, no BD response, ratio 70, TLC 68%, DLCO 35% Echo 08/2015 slight dilated RV  CBC Latest Ref Rng & Units 02/16/2015 09/17/2014 09/16/2014  WBC 4.0 - 10.5 K/uL 4.9 6.9 6.1  Hemoglobin 13.0 - 17.0 g/dL 13.7 12.7(L) 12.7(L)  Hematocrit 39.0 - 52.0 % 42.1 40.2 39.8  Platelets 150.0 - 400.0 K/uL 214.0 173 153    BMP Latest Ref Rng & Units 09/15/2014 09/14/2014 09/08/2014  Glucose 70 - 99 mg/dL 110(H) 142(H) 97  BUN 6 - 23 mg/dL _0 Creatinine 0.50 - 1.35 mg/dL 0.99 0.97 1.26  Sodium 135 - 145 mmol/L 138 134(L) 140  Potassium 3.5 - 5.1 mmol/L 5.7(H) 4.8 4.1  Chloride 96 - 112 mmol/L 101 101 105  CO2 19 - 32 mmol/L _1 Calcium 8.4 - 10.5 mg/dL 9.2 8.7 9.2    BNP    Component Value Date/Time   BNP 122.7 (H) 09/15/2014 1820   PFT    Component Value Date/Time   FEV1PRE 1.93 12/12/2014 0849   FEV1POST 1.89 12/12/2014 0849    FVCPRE 2.75 12/12/2014 0849   FVCPOST 2.85 12/12/2014 0849   TLC 5.41 12/12/2014 0849   DLCOUNC 13.26 12/12/2014 0849   PREFEV1FVCRT 70 12/12/2014 0849   PSTFEV1FVCRT 66 12/12/2014 0849     Past medical hx Past Medical History:  Diagnosis Date  . Arthritis   . COPD (chronic obstructive pulmonary disease) ([place])   . Folliculitis    posterior scalp per office visit note of Dr Maudie Mercury 07/20/2014    . GERD (gastroesophageal reflux disease)   . Hypertension   . Phlebitis    right arm  at least 20 years ago   . Pneumonia    hx of pneumonia as a child      Social History  Substance Use Topics  . Smoking status: Former Smoker    Packs/day: 1.00    Years: 50.00    Types: Cigarettes    Quit date: 08/19/2006  . Smokeless tobacco: Never Used  . Alcohol use 3.6 oz/week    6 Shots of liquor per week    Tobacco Cessation: Former smoker, quit 2008   Past surgical hx, Family hx, Social hx all reviewed.  Current Outpatient Prescriptions on File Prior to Visit  Medication Sig  . albuterol (PROVENTIL HFA;VENTOLIN HFA) 108 (90 Base) MCG/ACT inhaler  Inhale 1-2 puffs into the lungs every 6 (six) hours as needed for wheezing or shortness of breath.  . allopurinol (ZYLOPRIM) 100 MG tablet Take 100 mg by mouth daily.  Marland Kitchen aspirin 81 MG tablet Take 81 mg by mouth daily.  . Cinnamon 500 MG capsule Take 500 mg by mouth daily.  Marland Kitchen doxazosin (CARDURA) 4 MG tablet Take 4 mg by mouth daily. Patient takes in the pm  . furosemide (LASIX) 20 MG tablet Take 20 mg by mouth every Monday, Wednesday, and Friday.  . pantoprazole (PROTONIX) 40 MG tablet Take 40 mg by mouth daily.  . ramipril (ALTACE) 2.5 MG capsule Take 2.5 mg by mouth daily.  . fluticasone furoate-vilanterol (BREO ELLIPTA) 100-25 MCG/INH AEPB Inhale 1 puff into the lungs daily. (Patient not taking: Reported on 02/03/2017)   No current facility-administered medications on file prior to visit.      Allergies  Allergen Reactions  . Other      Beer- Swelling   . Sunflower Oil Swelling  . Sulfa Antibiotics Rash    Review Of Systems:  Constitutional:   No  weight loss, night sweats,  Fevers, chills, fatigue, or  lassitude.  HEENT:   No headaches,  Difficulty swallowing,  Tooth/dental problems, or  Sore throat,                No sneezing, itching, ear ache, nasal congestion, post nasal drip,   CV:  No chest pain,  Orthopnea, PND, swelling in lower extremities, anasarca, dizziness, palpitations, syncope.   GI  No heartburn, indigestion, abdominal pain, nausea, vomiting, diarrhea, change in bowel habits, loss of appetite, bloody stools.   Resp: No shortness of breath with exertion or at rest.  No excess mucus, no productive cough,  No non-productive cough,  No coughing up of blood.  No change in color of mucus.  No wheezing.  No chest wall deformity  Skin: no rash or lesions.  GU: no dysuria, change in color of urine, no urgency or frequency.  No flank pain, no hematuria   MS:  No joint pain or swelling.  No decreased range of motion.  No back pain.  Psych:  No change in mood or affect. No depression or anxiety.  No memory loss.   Vital Signs BP 126/64 (BP Location: Right Arm, Cuff Size: Normal)   Pulse 78   Ht _0  (1.905 m)   Wt 256 lb (116.1 kg)   SpO2 92%   BMI 32.00 kg/m    Physical Exam:  General- No distress,  A&Ox3 ENT: No sinus tenderness, TM clear, pale nasal mucosa, no oral exudate,no post nasal drip, no LAN Cardiac: S1, S2, regular rate and rhythm, no murmur Chest: No wheeze/ rales/ dullness; no accessory muscle use, no nasal flaring, no sternal retractions, slightly diminished per bases. Abd.: Soft Non-tender, obese Ext: No clubbing cyanosis, edema Neuro:  normal strength Skin: No rashes, warm and dry, earring, no lesions, warm and dry Psych: normal mood and behavior   Assessment/Plan  COPD (chronic obstructive pulmonary disease) Stable interval No significant improvement with Breo, Trelegy of  Anoro Plan: Continue using your rescue inhaler as needed for shortness of breath or wheezing Stop Breo as it did not help you. Remember to hold onto counter or chair when you bend over. Follow up with PCP regarding management of blood pressure. Remember to drink plenty of water with summer heat. Follow up with [name] in 12 months. Please contact office for sooner follow up if  symptoms do not improve or worsen or seek emergency care      Magdalen Spatz, NP 02/03/2017  11:52 AM

## 2017-02-03 NOTE — Patient Instructions (Signed)
It is good to see you today. Continue using your rescue inhaler as needed for shortness of breath or wheezing Stop Breo as it did not help you. Follow up with [name] in 12 months. Please contact office for sooner follow up if symptoms do not improve or worsen or seek emergency care

## 2017-02-13 DIAGNOSIS — R739 Hyperglycemia, unspecified: Secondary | ICD-10-CM | POA: Diagnosis not present

## 2017-02-13 DIAGNOSIS — M109 Gout, unspecified: Secondary | ICD-10-CM | POA: Diagnosis not present

## 2017-02-13 DIAGNOSIS — I10 Essential (primary) hypertension: Secondary | ICD-10-CM | POA: Diagnosis not present

## 2017-02-20 DIAGNOSIS — E7800 Pure hypercholesterolemia, unspecified: Secondary | ICD-10-CM | POA: Diagnosis not present

## 2017-02-20 DIAGNOSIS — I10 Essential (primary) hypertension: Secondary | ICD-10-CM | POA: Diagnosis not present

## 2017-02-20 DIAGNOSIS — R739 Hyperglycemia, unspecified: Secondary | ICD-10-CM | POA: Diagnosis not present

## 2017-02-20 DIAGNOSIS — R509 Fever, unspecified: Secondary | ICD-10-CM | POA: Diagnosis not present

## 2017-05-14 DIAGNOSIS — R972 Elevated prostate specific antigen [PSA]: Secondary | ICD-10-CM | POA: Diagnosis not present

## 2017-05-20 DIAGNOSIS — R972 Elevated prostate specific antigen [PSA]: Secondary | ICD-10-CM | POA: Diagnosis not present

## 2017-05-20 DIAGNOSIS — N400 Enlarged prostate without lower urinary tract symptoms: Secondary | ICD-10-CM | POA: Diagnosis not present

## 2017-05-27 DIAGNOSIS — Z23 Encounter for immunization: Secondary | ICD-10-CM | POA: Diagnosis not present

## 2017-06-18 DIAGNOSIS — L730 Acne keloid: Secondary | ICD-10-CM | POA: Diagnosis not present

## 2017-08-21 DIAGNOSIS — R739 Hyperglycemia, unspecified: Secondary | ICD-10-CM | POA: Diagnosis not present

## 2017-08-21 DIAGNOSIS — N390 Urinary tract infection, site not specified: Secondary | ICD-10-CM | POA: Diagnosis not present

## 2017-08-21 DIAGNOSIS — I10 Essential (primary) hypertension: Secondary | ICD-10-CM | POA: Diagnosis not present

## 2017-08-24 ENCOUNTER — Emergency Department (HOSPITAL_COMMUNITY): Payer: Medicare Other

## 2017-08-24 ENCOUNTER — Inpatient Hospital Stay (HOSPITAL_COMMUNITY)
Admission: EM | Admit: 2017-08-24 | Discharge: 2017-08-27 | DRG: 871 | Disposition: A | Discharge status: Home / Self Care | Payer: Medicare Other | Attending: Family Medicine | Admitting: Family Medicine

## 2017-08-24 DIAGNOSIS — D696 Thrombocytopenia, unspecified: Secondary | ICD-10-CM | POA: Diagnosis present

## 2017-08-24 DIAGNOSIS — R0902 Hypoxemia: Secondary | ICD-10-CM | POA: Diagnosis not present

## 2017-08-24 DIAGNOSIS — E860 Dehydration: Secondary | ICD-10-CM | POA: Diagnosis present

## 2017-08-24 DIAGNOSIS — I10 Essential (primary) hypertension: Secondary | ICD-10-CM | POA: Diagnosis present

## 2017-08-24 DIAGNOSIS — J449 Chronic obstructive pulmonary disease, unspecified: Secondary | ICD-10-CM | POA: Diagnosis not present

## 2017-08-24 DIAGNOSIS — I5030 Unspecified diastolic (congestive) heart failure: Secondary | ICD-10-CM | POA: Diagnosis present

## 2017-08-24 DIAGNOSIS — I2510 Atherosclerotic heart disease of native coronary artery without angina pectoris: Secondary | ICD-10-CM | POA: Diagnosis present

## 2017-08-24 DIAGNOSIS — J9601 Acute respiratory failure with hypoxia: Secondary | ICD-10-CM | POA: Diagnosis present

## 2017-08-24 DIAGNOSIS — I272 Pulmonary hypertension, unspecified: Secondary | ICD-10-CM | POA: Diagnosis present

## 2017-08-24 DIAGNOSIS — Z7982 Long term (current) use of aspirin: Secondary | ICD-10-CM

## 2017-08-24 DIAGNOSIS — N179 Acute kidney failure, unspecified: Secondary | ICD-10-CM | POA: Diagnosis present

## 2017-08-24 DIAGNOSIS — I351 Nonrheumatic aortic (valve) insufficiency: Secondary | ICD-10-CM | POA: Diagnosis not present

## 2017-08-24 DIAGNOSIS — M1990 Unspecified osteoarthritis, unspecified site: Secondary | ICD-10-CM | POA: Diagnosis present

## 2017-08-24 DIAGNOSIS — Z79899 Other long term (current) drug therapy: Secondary | ICD-10-CM | POA: Diagnosis not present

## 2017-08-24 DIAGNOSIS — M549 Dorsalgia, unspecified: Secondary | ICD-10-CM | POA: Diagnosis present

## 2017-08-24 DIAGNOSIS — J181 Lobar pneumonia, unspecified organism: Secondary | ICD-10-CM | POA: Diagnosis present

## 2017-08-24 DIAGNOSIS — N138 Other obstructive and reflux uropathy: Secondary | ICD-10-CM | POA: Diagnosis present

## 2017-08-24 DIAGNOSIS — Z882 Allergy status to sulfonamides status: Secondary | ICD-10-CM | POA: Diagnosis not present

## 2017-08-24 DIAGNOSIS — R0602 Shortness of breath: Secondary | ICD-10-CM | POA: Diagnosis not present

## 2017-08-24 DIAGNOSIS — W19XXXA Unspecified fall, initial encounter: Secondary | ICD-10-CM | POA: Diagnosis present

## 2017-08-24 DIAGNOSIS — I5033 Acute on chronic diastolic (congestive) heart failure: Secondary | ICD-10-CM | POA: Diagnosis present

## 2017-08-24 DIAGNOSIS — Z86711 Personal history of pulmonary embolism: Secondary | ICD-10-CM

## 2017-08-24 DIAGNOSIS — J440 Chronic obstructive pulmonary disease with acute lower respiratory infection: Secondary | ICD-10-CM | POA: Diagnosis present

## 2017-08-24 DIAGNOSIS — M546 Pain in thoracic spine: Secondary | ICD-10-CM | POA: Diagnosis not present

## 2017-08-24 DIAGNOSIS — R000 Tachycardia, unspecified: Secondary | ICD-10-CM | POA: Diagnosis not present

## 2017-08-24 DIAGNOSIS — R911 Solitary pulmonary nodule: Secondary | ICD-10-CM | POA: Diagnosis present

## 2017-08-24 DIAGNOSIS — Z87891 Personal history of nicotine dependence: Secondary | ICD-10-CM | POA: Diagnosis not present

## 2017-08-24 DIAGNOSIS — I248 Other forms of acute ischemic heart disease: Secondary | ICD-10-CM | POA: Diagnosis present

## 2017-08-24 DIAGNOSIS — Z91018 Allergy to other foods: Secondary | ICD-10-CM

## 2017-08-24 DIAGNOSIS — K219 Gastro-esophageal reflux disease without esophagitis: Secondary | ICD-10-CM | POA: Diagnosis present

## 2017-08-24 DIAGNOSIS — J189 Pneumonia, unspecified organism: Secondary | ICD-10-CM

## 2017-08-24 DIAGNOSIS — R55 Syncope and collapse: Secondary | ICD-10-CM | POA: Diagnosis present

## 2017-08-24 DIAGNOSIS — I5032 Chronic diastolic (congestive) heart failure: Secondary | ICD-10-CM | POA: Diagnosis present

## 2017-08-24 DIAGNOSIS — I110 Hypertensive heart disease with heart failure: Secondary | ICD-10-CM | POA: Diagnosis present

## 2017-08-24 DIAGNOSIS — A419 Sepsis, unspecified organism: Secondary | ICD-10-CM | POA: Diagnosis not present

## 2017-08-24 DIAGNOSIS — R748 Abnormal levels of other serum enzymes: Secondary | ICD-10-CM | POA: Diagnosis not present

## 2017-08-24 DIAGNOSIS — Z96652 Presence of left artificial knee joint: Secondary | ICD-10-CM | POA: Diagnosis present

## 2017-08-24 DIAGNOSIS — I214 Non-ST elevation (NSTEMI) myocardial infarction: Secondary | ICD-10-CM | POA: Diagnosis not present

## 2017-08-24 DIAGNOSIS — I5031 Acute diastolic (congestive) heart failure: Secondary | ICD-10-CM | POA: Diagnosis not present

## 2017-08-24 DIAGNOSIS — S299XXA Unspecified injury of thorax, initial encounter: Secondary | ICD-10-CM | POA: Diagnosis not present

## 2017-08-24 DIAGNOSIS — N401 Enlarged prostate with lower urinary tract symptoms: Secondary | ICD-10-CM | POA: Diagnosis present

## 2017-08-24 HISTORY — DX: Benign prostatic hyperplasia without lower urinary tract symptoms: N40.0

## 2017-08-24 HISTORY — DX: Other pulmonary embolism without acute cor pulmonale: I26.99

## 2017-08-24 LAB — URINALYSIS, ROUTINE W REFLEX MICROSCOPIC
Bacteria, UA: NONE SEEN
Bilirubin Urine: NEGATIVE
Glucose, UA: NEGATIVE mg/dL
Hgb urine dipstick: NEGATIVE
Ketones, ur: 5 mg/dL — AB
Leukocytes, UA: NEGATIVE
Nitrite: NEGATIVE
Protein, ur: 30 mg/dL — AB
Specific Gravity, Urine: 1.02 (ref 1.005–1.030)
pH: 5 (ref 5.0–8.0)

## 2017-08-24 LAB — COMPREHENSIVE METABOLIC PANEL
ALT: 27 U/L (ref 17–63)
AST: 41 U/L (ref 15–41)
Albumin: 2.9 g/dL — ABNORMAL LOW (ref 3.5–5.0)
Alkaline Phosphatase: 62 U/L (ref 38–126)
Anion gap: 8 (ref 5–15)
BUN: 21 mg/dL — ABNORMAL HIGH (ref 6–20)
CO2: 28 mmol/L (ref 22–32)
Calcium: 8.5 mg/dL — ABNORMAL LOW (ref 8.9–10.3)
Chloride: 99 mmol/L — ABNORMAL LOW (ref 101–111)
Creatinine, Ser: 1.61 mg/dL — ABNORMAL HIGH (ref 0.61–1.24)
GFR calc Af Amer: 45 mL/min — ABNORMAL LOW (ref >60)
GFR calc non Af Amer: 39 mL/min — ABNORMAL LOW (ref >60)
Glucose, Bld: 155 mg/dL — ABNORMAL HIGH (ref 65–99)
Potassium: 4 mmol/L (ref 3.5–5.1)
Sodium: 135 mmol/L (ref 135–145)
Total Bilirubin: 1.1 mg/dL (ref 0.3–1.2)
Total Protein: 6.2 g/dL — ABNORMAL LOW (ref 6.5–8.1)

## 2017-08-24 LAB — CBC
HCT: 44.2 % (ref 39.0–52.0)
Hemoglobin: 14.1 g/dL (ref 13.0–17.0)
MCH: 28.4 pg (ref 26.0–34.0)
MCHC: 31.9 g/dL (ref 30.0–36.0)
MCV: 88.9 fL (ref 78.0–100.0)
Platelets: 145 10*3/uL — ABNORMAL LOW (ref 150–400)
RBC: 4.97 MIL/uL (ref 4.22–5.81)
RDW: 14.8 % (ref 11.5–15.5)
WBC: 11.8 10*3/uL — ABNORMAL HIGH (ref 4.0–10.5)

## 2017-08-24 LAB — I-STAT CG4 LACTIC ACID, ED
Lactic Acid, Venous: 2.08 mmol/L (ref 0.5–1.9)
Lactic Acid, Venous: 2.13 mmol/L (ref 0.5–1.9)

## 2017-08-24 LAB — BRAIN NATRIURETIC PEPTIDE: B Natriuretic Peptide: 1253.8 pg/mL — ABNORMAL HIGH (ref 0.0–100.0)

## 2017-08-24 LAB — I-STAT TROPONIN, ED: Troponin i, poc: 0.57 ng/mL (ref 0.00–0.08)

## 2017-08-24 LAB — TROPONIN I: Troponin I: 0.4 ng/mL (ref <0.03)

## 2017-08-24 MED ORDER — SODIUM CHLORIDE 0.9 % IV BOLUS (SEPSIS)
1000.0000 mL | Freq: Once | INTRAVENOUS | Status: DC
  Administered 2017-08-24: 1000 mL via INTRAVENOUS

## 2017-08-24 MED ORDER — IPRATROPIUM-ALBUTEROL 0.5-2.5 (3) MG/3ML IN SOLN: 3.0000 mL | Freq: Four times a day (QID) | RESPIRATORY_TRACT | Status: DC | PRN

## 2017-08-24 MED ORDER — DEXTROSE 5 % IV SOLN
1.0000 g | Freq: Once | INTRAVENOUS | Status: DC
  Filled 2017-08-24: qty 10

## 2017-08-24 MED ORDER — SODIUM CHLORIDE 0.9 % IV BOLUS (SEPSIS): 1000.0000 mL | Freq: Once | INTRAVENOUS | Status: DC

## 2017-08-24 MED ORDER — ASPIRIN 81 MG PO CHEW
324.0000 mg | CHEWABLE_TABLET | Freq: Once | ORAL | Status: AC
  Administered 2017-08-24: 324 mg via ORAL
  Filled 2017-08-24: qty 4

## 2017-08-24 MED ORDER — ACETAMINOPHEN 325 MG PO TABS: 650.0000 mg | ORAL_TABLET | Freq: Four times a day (QID) | ORAL | Status: DC | PRN

## 2017-08-24 MED ORDER — HYDROCODONE-ACETAMINOPHEN 5-325 MG PO TABS
1.0000 | ORAL_TABLET | Freq: Once | ORAL | Status: AC
  Administered 2017-08-24: 1 via ORAL
  Filled 2017-08-24: qty 1

## 2017-08-24 MED ORDER — IOPAMIDOL (ISOVUE-370) INJECTION 76%
INTRAVENOUS | Status: AC
  Administered 2017-08-24: 100 mL
  Filled 2017-08-24: qty 100

## 2017-08-24 MED ORDER — DEXTROSE 5 % IV SOLN: 500.0000 mg | Freq: Once | INTRAVENOUS | Status: DC

## 2017-08-24 MED ORDER — MORPHINE SULFATE (PF) 4 MG/ML IV SOLN: 2.0000 mg | INTRAVENOUS | Status: DC | PRN

## 2017-08-24 MED ORDER — SODIUM CHLORIDE 0.9 % IV BOLUS (SEPSIS)
500.0000 mL | Freq: Once | INTRAVENOUS | Status: DC
  Administered 2017-08-24: 500 mL via INTRAVENOUS

## 2017-08-24 MED ORDER — MORPHINE SULFATE (PF) 4 MG/ML IV SOLN: 2.0000 mg | INTRAVENOUS | Status: AC

## 2017-08-24 MED ORDER — DEXTROSE 5 % IV SOLN: 1.0000 g | INTRAVENOUS | Status: DC

## 2017-08-24 MED ORDER — ONDANSETRON HCL 4 MG PO TABS: 4.0000 mg | ORAL_TABLET | Freq: Four times a day (QID) | ORAL | Status: DC | PRN

## 2017-08-24 MED ORDER — DEXTROSE 5 % IV SOLN
500.0000 mg | INTRAVENOUS | Status: DC
  Administered 2017-08-24 – 2017-08-25 (×2): 500 mg via INTRAVENOUS
  Filled 2017-08-24 (×2): qty 500

## 2017-08-24 MED ORDER — ALLOPURINOL 100 MG PO TABS
100.0000 mg | ORAL_TABLET | Freq: Every day | ORAL | Status: DC
  Administered 2017-08-25 – 2017-08-27 (×3): 100 mg via ORAL
  Filled 2017-08-24 (×4): qty 1

## 2017-08-24 MED ORDER — ASPIRIN EC 81 MG PO TBEC
81.0000 mg | DELAYED_RELEASE_TABLET | Freq: Every day | ORAL | Status: DC
  Administered 2017-08-25 – 2017-08-27 (×3): 81 mg via ORAL
  Filled 2017-08-24 (×3): qty 1

## 2017-08-24 MED ORDER — DOXAZOSIN MESYLATE 4 MG PO TABS
4.0000 mg | ORAL_TABLET | Freq: Every day | ORAL | Status: DC
  Administered 2017-08-25 – 2017-08-26 (×3): 4 mg via ORAL
  Filled 2017-08-24 (×5): qty 1

## 2017-08-24 MED ORDER — DEXTROSE 5 % IV SOLN
500.0000 mg | Freq: Once | INTRAVENOUS | Status: DC
  Filled 2017-08-24: qty 500

## 2017-08-24 MED ORDER — DEXTROSE 5 % IV SOLN
2.0000 g | INTRAVENOUS | Status: DC
  Administered 2017-08-24 – 2017-08-25 (×2): 2 g via INTRAVENOUS
  Filled 2017-08-24 (×3): qty 2

## 2017-08-24 MED ORDER — ONDANSETRON HCL 4 MG/2ML IJ SOLN: 4.0000 mg | Freq: Four times a day (QID) | INTRAMUSCULAR | Status: DC | PRN

## 2017-08-24 MED ORDER — SODIUM CHLORIDE 0.9 % IV BOLUS (SEPSIS): 500.0000 mL | Freq: Once | INTRAVENOUS | Status: DC

## 2017-08-24 MED ORDER — PANTOPRAZOLE SODIUM 40 MG PO TBEC
40.0000 mg | DELAYED_RELEASE_TABLET | Freq: Every day | ORAL | Status: DC
  Administered 2017-08-25 – 2017-08-27 (×3): 40 mg via ORAL
  Filled 2017-08-24 (×3): qty 1

## 2017-08-24 MED ORDER — ACETAMINOPHEN 650 MG RE SUPP: 650.0000 mg | Freq: Four times a day (QID) | RECTAL | Status: DC | PRN

## 2017-08-24 MED ORDER — METHOCARBAMOL 500 MG PO TABS
1000.0000 mg | ORAL_TABLET | Freq: Once | ORAL | Status: AC
  Administered 2017-08-24: 1000 mg via ORAL
  Filled 2017-08-24: qty 2

## 2017-08-24 NOTE — ED Notes (Signed)
Patient transported to X-ray 

## 2017-08-24 NOTE — H&P (Signed)
History and Physical    Joshua [name] OJJ:[id number] DOB: 1936-08-01 DOA: 08/24/2017  Referring MD/NP/PA: Dr. Tanna Furry PCP: Jani Gravel, MD  Patient coming from: Home via EMS  Chief Complaint: Fall   I have personally briefly reviewed patient's old medical records in [place]   HPI: Joshua [name] is a 82 y.o. male with medical history significant of HTN, COPD, GERD, h/o provoked PE now off anticoagulation; who presents after waking up on the floor.  Patient had walked up 1-2 steps prior to onset of symptoms and reported feeling increased shortness of breath prior to passing out.  He denies having any significant trauma to his head, but did hit his back.  His wife states that he was only out for a few seconds if that and appeared in a days when she found him.  Over the last few days prior to night he noted complaints of generalized malaise and fatigue.  Upon EMS arrival patient was noted to be more alert O2 saturations 90% on 3 L.  Patient reports previous history of pulmonary embolus for which she was placed on Xarelto for 1 year, but discontinued as it was thought that it was provoked.  Denies any history of he has a significant clotting disorder in his family.  Family history is for his brother having a heart attack at around the age of 98. Patient used to smoke tobacco tobacco use in the past, but quit back in 2007.  ED Course: Upon admission into the emergency department patient was noted to be febrile up to 101.2 F, heart rates up to 107, respirations up to 29,  blood pressures 159/93, and O2 saturations   abs revealed WBC 11.8, platelets 145, BUN 21, creatinine 1.61, BNP 1253.8, initial i-STAT troponin 0.57, and troponin I 0.4.  Chest x-ray showed mild to moderate CHF.  However due to sepsis protocol initiated and the patient had been given full fluid bolus with  Review of Systems  Constitutional: Positive for malaise/fatigue. Negative for fever.  HENT: Negative for ear  discharge and nosebleeds.   Eyes: Negative for double vision and photophobia.  Respiratory: Positive for shortness of breath.   Cardiovascular: Positive for leg swelling (Intermittently but chronic). Negative for chest pain and orthopnea.  Gastrointestinal: Negative for abdominal pain, nausea and vomiting.  Genitourinary: Positive for dysuria and frequency.  Musculoskeletal: Positive for back pain.  Skin: Negative for itching and rash.  Neurological: Positive for loss of consciousness. Negative for focal weakness.  Psychiatric/Behavioral: Negative for substance abuse and suicidal ideas.    Past Medical History:  Diagnosis Date  . Arthritis   . COPD (chronic obstructive pulmonary disease) ([place])   . Folliculitis    posterior scalp per office visit note of Dr Maudie Mercury 07/20/2014    . GERD (gastroesophageal reflux disease)   . Hypertension   . Phlebitis    right arm  at least 20 years ago   . Pneumonia    hx of pneumonia as a child     Past Surgical History:  Procedure Laterality Date  . bone removed from little toe right foot     . CHOLECYSTECTOMY    . pilonidal cyst removal     . TOTAL KNEE ARTHROPLASTY Left 09/13/2014   Procedure: LEFT TOTAL KNEE ARTHROPLASTY;  Surgeon: Mauri Pole, MD;  Location: [facility];  Service: Orthopedics;  Laterality: Left;     reports that he quit smoking about 11 years ago. His smoking use included cigarettes. He  has a 50.00 pack-year smoking history. he has never used smokeless tobacco. He reports that he drinks about 3.6 oz of alcohol per week. He reports that he does not use drugs.  Allergies  Allergen Reactions  . Other     Beer- Swelling   . Sunflower Oil Swelling  . Sulfa Antibiotics Rash    Family History  Problem Relation Age of Onset  . Heart attack Brother 42    Prior to Admission medications   Medication Sig Start Date End Date Taking? Authorizing Provider  albuterol (PROVENTIL HFA;VENTOLIN HFA) 108 (90 Base) MCG/ACT inhaler Inhale  1-2 puffs into the lungs every 6 (six) hours as needed for wheezing or shortness of breath. 11/11/16  Yes Magdalen Spatz, NP  allopurinol (ZYLOPRIM) 100 MG tablet Take 100 mg by mouth daily.   Yes [provider]  aspirin 81 MG tablet Take 81 mg by mouth daily.   Yes [provider]  Cinnamon 500 MG capsule Take 500 mg by mouth daily.   Yes [provider]  doxazosin (CARDURA) 4 MG tablet Take 4 mg by mouth at bedtime. Patient takes in the pm   Yes [provider]  furosemide (LASIX) 20 MG tablet Take 20-40 mg by mouth daily. Take 20 mg everyday on Monday, Wednesday and Friday take 40 mg   Yes [provider]  pantoprazole (PROTONIX) 40 MG tablet Take 40 mg by mouth daily.   Yes [provider]  ramipril (ALTACE) 2.5 MG capsule Take 2.5 mg by mouth daily.   Yes [provider]    Physical Exam:  Constitutional: Older male who appears to be in some moderate discomfort. Vitals:   08/24/17 2100 08/24/17 2145 08/24/17 2200 08/24/17 2215  BP: 113/78 (!) 147/87 (!) 159/93 (!) 145/83  Pulse: 100 96 96 94  Resp: (!) 28 (!) 25 (!) 29 (!) 22  Temp:      TempSrc:      SpO2: 98% 95% 98% 97%  Weight:      Height:       Eyes: PERRL, lids and conjunctivae normal ENMT: Mucous membranes are moist. Posterior pharynx clear of any exudate or lesions.  Neck: normal, supple, no masses, no thyromegaly Respiratory: Mildly tachypneic.  Decreased overall aeration and movement with some mild crackles noted along the bases.  Cardiovascular: Regular rate and rhythm, no murmurs / rubs / gallops.  Trace lower extremity edema. 2+ pedal pulses. No carotid bruits.  Abdomen: no tenderness, no masses palpated. No hepatosplenomegaly. Bowel sounds positive.  Musculoskeletal: no clubbing / cyanosis. No joint deformity upper and lower extremities.  No gross deformity appreciated of the spine, but tenderness to palpation of the thoracic spine appreciated.  Skin: no  rashes, lesions, ulcers. No induration Neurologic: CN 2-12 grossly intact. Sensation intact, DTR normal. Strength 5/5 in all 4.  Psychiatric: Normal judgment and insight. Alert and oriented x 3. Normal mood.     Labs on Admission: I have personally reviewed following labs and imaging studies  CBC: Recent Labs  Lab 08/24/17 1924  WBC 11.8*  HGB 14.1  HCT 44.2  MCV 88.9  PLT 438*   Basic Metabolic Panel: Recent Labs  Lab 08/24/17 1924  NA 135  K 4.0  CL 99*  CO2 28  GLUCOSE 155*  BUN 21*  CREATININE 1.61*  CALCIUM 8.5*   GFR: Estimated Creatinine Clearance: 51 mL/min (A) (by C-G formula based on SCr of 1.61 mg/dL (H)). Liver Function Tests: Recent Labs  Lab  08/24/17 1924  AST 41  ALT 27  ALKPHOS 62  BILITOT 1.1  PROT 6.2*  ALBUMIN 2.9*   No results for input(s): LIPASE, AMYLASE in the last 168 hours. No results for input(s): AMMONIA in the last 168 hours. Coagulation Profile: No results for input(s): INR, PROTIME in the last 168 hours. Cardiac Enzymes: Recent Labs  Lab 08/24/17 2005  TROPONINI 0.40*   BNP (last 3 results) No results for input(s): PROBNP in the last 8760 hours. HbA1C: No results for input(s): HGBA1C in the last 72 hours. CBG: No results for input(s): GLUCAP in the last 168 hours. Lipid Profile: No results for input(s): CHOL, HDL, LDLCALC, TRIG, CHOLHDL, LDLDIRECT in the last 72 hours. Thyroid Function Tests: No results for input(s): TSH, T4TOTAL, FREET4, T3FREE, THYROIDAB in the last 72 hours. Anemia Panel: No results for input(s): VITAMINB12, FOLATE, FERRITIN, TIBC, IRON, RETICCTPCT in the last 72 hours. Urine analysis:    Component Value Date/Time   COLORURINE YELLOW 08/24/2017 2138   APPEARANCEUR CLEAR 08/24/2017 2138   LABSPEC 1.020 08/24/2017 2138   PHURINE 5.0 08/24/2017 2138   GLUCOSEU NEGATIVE 08/24/2017 2138   HGBUR NEGATIVE 08/24/2017 2138   BILIRUBINUR NEGATIVE 08/24/2017 2138   KETONESUR 5 (A) 08/24/2017 2138    PROTEINUR 30 (A) 08/24/2017 2138   UROBILINOGEN 0.2 09/08/2014 0927   NITRITE NEGATIVE 08/24/2017 2138   LEUKOCYTESUR NEGATIVE 08/24/2017 2138   Sepsis Labs: No results found for this or any previous visit (from the past 240 hour(s)).   Radiological Exams on Admission: Dg Chest 2 View  Result Date: 08/24/2017 CLINICAL DATA:  Hypoxia.  Syncope. EXAM: CHEST  2 VIEW COMPARISON:  01/09/2017 FINDINGS: The heart is enlarged. Unchanged mediastinal contours. Moderate pulmonary edema. Small pleural effusions. No confluent airspace disease. No pneumothorax. No acute osseous abnormalities. IMPRESSION: Mild to moderate CHF. Electronically Signed   By: Jeb Levering M.D.   On: 08/24/2017 21:00   Ct Angio Chest Pe W/cm &/or Wo Cm  Result Date: 08/24/2017 CLINICAL DATA:  PE suspected, high pretest prob. Shortness of breath. Syncope leading to fall. EXAM: CT ANGIOGRAPHY CHEST WITH CONTRAST TECHNIQUE: Multidetector CT imaging of the chest was performed using the standard protocol during bolus administration of intravenous contrast. Multiplanar CT image reconstructions and MIPs were obtained to evaluate the vascular anatomy. CONTRAST:  129m ISOVUE-370 IOPAMIDOL (ISOVUE-370) INJECTION 76% COMPARISON:  Chest radiograph earlier this day.  Chest CT 09/15/2014 FINDINGS: Cardiovascular: Breathing motion artifact obscures lower lobe evaluation. Prior bilateral pulmonary emboli have resolved. No residual or new filling defects in the pulmonary arteries. Prominent main pulmonary artery measuring 3.8 cm. Multi chamber cardiomegaly. There are coronary artery calcifications. Atherosclerosis of the thoracic aorta without evidence of dissection. Mediastinum/Nodes: Ovoid right infrahilar low-density measuring 19 x 12 mm likely an infrahilar lymph node, central pulmonary lesion is also considered. Additional shotty mediastinal nodes. Small to moderate hiatal hernia. No thyroid nodule. Lungs/Pleura: Emphysema. Breathing motion  artifact obscures lower lobe evaluation. Mild upper lobe septal thickening suspicious for pulmonary edema. Ill-defined right upper lobe ground-glass opacity image 40 series 7 is new from prior exam. 9 x 6 mm left upper lobe pulmonary nodule image 67 series 7 is also new from prior exam. Small clustered nodules slightly more inferiorly in the left upper lobe. Patchy ground-glass opacities in the right lung base are obscured by motion. Minimal pleural fluid or thickening bilaterally, right greater than left. Upper Abdomen: No acute abnormality. Small to moderate hiatal hernia. Musculoskeletal: There are no acute or suspicious osseous  abnormalities. Review of the MIP images confirms the above findings. IMPRESSION: 1. No acute pulmonary embolus. Previous bilateral pulmonary emboli have resolved, lower lobe subsegmental evaluation obscured by breathing motion. 2. Cardiomegaly with mild pulmonary edema. 3. Aortic atherosclerosis and coronary artery calcifications. 4. Left upper lobe pulmonary nodules, largest 9 x 6 mm (mean 8 mm). Focal ground-glass opacity in the left upper lobe. Enlarged right infrahilar nodes versus central pulmonary lesion. These findings are all nonspecific, new from exam 2 years prior. Initial follow-up with CT at 6-12 months is recommended to confirm persistence. 5. Emphysema. Aortic Atherosclerosis (ICD10-I70.0) and Emphysema (ICD10-J43.9). Electronically Signed   By: Jeb Levering M.D.   On: 08/24/2017 22:20    EKG: Independently reviewed.    Assessment/Plan Fall /syncope and collapse: Patient presents after having a fall with possible syncopal episode.  Unclear cause at this time differential includes syncope (MI/arrhythmia /hypotension)  Vs. PE, but CT angiogram of the chest negative for any signs of a PE. - Admit to a stepdown bed - NPO - Follow-up telemetry overnight  Sepsis 2/2 community-acquired pneumonia: Acute.  Patient presents febrile up to 101.2 F, tachycardic and  tachypneic.  WBC elevated at 11.8 lactic acid elevated at 2.08. - Follow-up blood and sputum cultures - Follow-up respiratory virus panel - Continue empiric antibiotics of ceftriaxone and azithromycin - Trend lactic acid level   NSTEMI/Elevated troponin: Initial i-STAT troponin was noted to be 0.57 and repeat serum troponin I 0.4. Coronary artery calcification noted on CT.. - Trend cardiac troponins  - heparin drip per pharmacy - Check echocardiogram - Cardiology consult Dr. Emilio Aspen    Acute respiratory failure with hypoxia, history of COPD: Patient acutely noted to have low O2 saturations into the upper 80s on arrival of EMS placed on 3 L of nasal oxygen to improve O2 saturations.  Patient had been keeping him from taking  deep breaths. - Continuous pulse oximetry with nasal cannula oxygen as needed overnight - DuoNeb's as needed - Incentive spirometry  Back pain: Patient reports being in severe back pain following fall.  Patient reports having back spasms now.  -CheckThoracic  - Oxycodone prn pain  Acute kidney injury: Baseline creatinine previously noted to be around 0.99 back in 2016.  On admission creatinine elevation of 1.61 and BUN 21, but noting patient on diuretics of Lasix.  Patient was initially given 3 L of normal saline IV fluids.  Suspect patient is likely dehydrated due to concentrated urine. - Check FeUr due to Lasix use - Recheck creatinine in a.m.  Elevated BNP, cardiomegaly: Acute.  Initial BMP noted to be 1253.8.  Given previous history of provoked PE question possibility of pulmonary hypertension causing symptoms as patient does not appear grossly fluid overloaded especially in the setting of receiving approximately 3 L of normal saline IV fluids in the ED.  - Follow-up echocardiogram  Essential hypertension - Held Lasix and lisinopril due to suspected AKI   History of PE: Pulmonary embolus thought to have been provoked after previous knee surgery patient spent 1  year on Xarelto and then it was discontinued.  No signs of pulmonary embolus on CT angiogram of the chest.  Thrombocytopenia: Acute.  Initial platelet count 145 on admission. - Check CBC in a.m.   BPH : Patient notes history of self catheterizing. - Continue doxazosin - Orders for in and out cath every 8 hours placed  Pulmonary nodules: Incidental finding on CT -   GERD  - Continue Protonix DVT prophylaxis: Heparin Code Status:  Full Family Communication: Discussed plan of care with the patient family present at bedside Disposition Plan: To be determined Consults called: Cardiology Admission status: Inpatient  Norval Morton MD [facility] Pager [phone]   If 7PM-7AM, please contact night-coverage [email or web address] Password [email or web address]  08/24/2017, 11:01 PM

## 2017-08-24 NOTE — ED Triage Notes (Signed)
Pt BIB EMS from home for fall/LOC. Pt states he was going up approx 13 stairs, felt short of breath, and passed out. Per EMS pt LOC approx 2-3 min. Pt alert on EMS arrival, sats 90% on 3L. Sats 99% on 3L and A&Ox4 on arrival here. Pt reporting back pain, denies neck or head pain. NAD. EDP at bedside.

## 2017-08-24 NOTE — ED Provider Notes (Signed)
[facility] EMERGENCY DEPARTMENT Provider Note   CSN: [id number] Arrival date & time: 08/24/17  1850     History   Chief Complaint Chief Complaint  Patient presents with  . Loss of Consciousness    HPI Joshua [name] is a 82 y.o. male presenting for evaluation after a syncopal episode.  Patient states he walked up 2 flights of stairs and felt increased shortness of breath.Next thing he knew he was waking up on the floor.  Wife reached him in 2-3 minutes.  Patient denies hitting his head.  He currently reports some continued shortness of breath at rest, and increased shortness of breath with exertion.  He reports a mild intermittent cough which is been present for many weeks, worse at night.  It is nonproductive.  He reports some mild back pain with movement, no pain at rest.  This began after the fall.  He denies fevers, chills, sore throat, chest pain, nausea, vomiting, abdominal pain, urinary symptoms, abnormal bowel movements, leg pain or swelling.  He has a history of COPD, but is not on oxygen at baseline.  He is not currently on any maintenance medications.  History of PE after a knee surgery.  He was on Xarelto, but has not been on this for about 2 years.  He follows up regularly with Dr. Maudie Mercury.  He denies recent travel, surgery, immobilization, or history of cancer.  He is not on hormones.  He no longer smokes, reports occasional alcohol use, none today, no other drug use. Patient denies vision changes, slurred speech, decreased concentration, head, or neck pain.  He denies loss of bowel or bladder control.  He denies numbness or tingling.  He has not ambulated since the syncopal episode.  EMS reports upon arrival to the scene, patient was upper 80s/low 90s on room air and tachycardic at 130.  This improved with oxygen.   HPI  Past Medical History:  Diagnosis Date  . Arthritis   . COPD (chronic obstructive pulmonary disease) ([place])   . Folliculitis    posterior  scalp per office visit note of Dr Maudie Mercury 07/20/2014    . GERD (gastroesophageal reflux disease)   . Hypertension   . Phlebitis    right arm  at least 20 years ago   . Pneumonia    hx of pneumonia as a child     Patient Active Problem List   Diagnosis Date Noted  . Essential hypertension 02/16/2015  . COPD (chronic obstructive pulmonary disease) ([place]) 12/12/2014  . Acute pulmonary embolism ([place]) 10/10/2014  . S/P knee replacement 09/13/2014    Past Surgical History:  Procedure Laterality Date  . bone removed from little toe right foot     . CHOLECYSTECTOMY    . pilonidal cyst removal     . TOTAL KNEE ARTHROPLASTY Left 09/13/2014   Procedure: LEFT TOTAL KNEE ARTHROPLASTY;  Surgeon: Mauri Pole, MD;  Location: [facility];  Service: Orthopedics;  Laterality: Left;       Home Medications    Prior to Admission medications   Medication Sig Start Date End Date Taking? Authorizing Provider  albuterol (PROVENTIL HFA;VENTOLIN HFA) 108 (90 Base) MCG/ACT inhaler Inhale 1-2 puffs into the lungs every 6 (six) hours as needed for wheezing or shortness of breath. 11/11/16   Magdalen Spatz, NP  allopurinol (ZYLOPRIM) 100 MG tablet Take 100 mg by mouth daily.    [provider]  aspirin 81 MG tablet Take 81 mg by mouth daily.  [provider]  Cinnamon 500 MG capsule Take 500 mg by mouth daily.    [provider]  doxazosin (CARDURA) 4 MG tablet Take 4 mg by mouth daily. Patient takes in the pm    [provider]  fluticasone furoate-vilanterol (BREO ELLIPTA) 100-25 MCG/INH AEPB Inhale 1 puff into the lungs daily. Patient not taking: Reported on 02/03/2017 11/11/16   Magdalen Spatz, NP  furosemide (LASIX) 20 MG tablet Take 20 mg by mouth every Monday, Wednesday, and Friday.    [provider]  pantoprazole (PROTONIX) 40 MG tablet Take 40 mg by mouth daily.    [provider]  ramipril (ALTACE) 2.5 MG capsule Take 2.5 mg by mouth daily.     [provider]    Family History No family history on file.  Social History Social History   Tobacco Use  . Smoking status: Former Smoker    Packs/day: 1.00    Years: 50.00    Pack years: 50.00    Types: Cigarettes    Last attempt to quit: 08/19/2006    Years since quitting: 11.0  . Smokeless tobacco: Never Used  Substance Use Topics  . Alcohol use: Yes    Alcohol/week: 3.6 oz    Types: 6 Shots of liquor per week  . Drug use: No     Allergies   Other; Sunflower oil; and Sulfa antibiotics   Review of Systems Review of Systems  Respiratory: Positive for shortness of breath.   Musculoskeletal: Positive for back pain.  All other systems reviewed and are negative.    Physical Exam Updated Vital Signs BP 139/80   Pulse 99   Temp (!) 101.2 F (38.4 C) (Oral)   Resp 20   Ht _0  (1.905 m)   Wt 115.7 kg (255 lb)   SpO2 100%   BMI 31.87 kg/m   Physical Exam  Constitutional: He is oriented to person, place, and time. He appears well-developed and well-nourished. No distress.  HENT:  Head: Normocephalic and atraumatic.  No tenderness to palpation of the head or scalp.  No obvious laceration, hematoma, or injury.  Eyes: Conjunctivae and EOM are normal. Pupils are equal, round, and reactive to light.  Neck: Normal range of motion.  No tenderness palpation midline C-spine.  Full active range of motion of the neck without difficulty.  Cardiovascular: Regular rhythm and intact distal pulses.  Tachycardic between 100-105  Pulmonary/Chest: Effort normal and breath sounds normal. No accessory muscle usage. No respiratory distress. He has no decreased breath sounds. He has no wheezes. He has no rhonchi. He has no rales.  Patient speaking full sentences.  Good air movement in all lung fields.  No wheezing, rales, rhonchi, or decreased breath sounds heard.  Abdominal: Soft. He exhibits no distension and no mass. There is no tenderness. There is no rebound and no  guarding.  Musculoskeletal: He exhibits tenderness. He exhibits no edema.  No leg pain or swelling.  Pedal pulses intact bilaterally.  Strength intact bilaterally.  Radial pulses intact bilaterally.  Sensation intact bilaterally.  Tenderness palpation of mid thoracic back without increased pain over midline spine. Pt is able to sit up from leaning without difficulty.  No tenderness palpation elsewhere.  Neurological: He is alert and oriented to person, place, and time. He has normal strength. No sensory deficit. GCS eye subscore is 4. GCS verbal subscore is 5. GCS motor subscore is 6.  Skin: Skin is warm and dry.  Psychiatric: He has a  normal mood and affect.  Nursing note and vitals reviewed.    ED Treatments / Results  Labs (all labs ordered are listed, but only abnormal results are displayed) Labs Reviewed  CBC - Abnormal; Notable for the following components:      Result Value   WBC 11.8 (*)    Platelets 145 (*)    All other components within normal limits  I-STAT TROPONIN, ED - Abnormal; Notable for the following components:   Troponin i, poc 0.57 (*)    All other components within normal limits  RESPIRATORY PANEL BY PCR  CULTURE, BLOOD (ROUTINE X 2)  CULTURE, BLOOD (ROUTINE X 2)  COMPREHENSIVE METABOLIC PANEL  INFLUENZA PANEL BY PCR (TYPE A & B)  URINALYSIS, ROUTINE W REFLEX MICROSCOPIC  TROPONIN I  I-STAT CG4 LACTIC ACID, ED    EKG  EKG Interpretation  Date/Time:  Sunday August 24 2017 19:04:57 EST Ventricular Rate:  103 PR Interval:    QRS Duration: 156 QT Interval:  387 QTC Calculation: 507 R Axis:   107 Text Interpretation:  Sinus tachycardia Ventricular premature complex RBBB and LPFB Confirmed by Tanna Furry [phone]) on 08/24/2017 7:12:47 PM       Radiology No results found.  Procedures Procedures (including critical care time)  Medications Ordered in ED Medications  sodium chloride 0.9 % bolus 1,000 mL (not administered)    And  sodium chloride 0.9  % bolus 1,000 mL (not administered)    And  sodium chloride 0.9 % bolus 1,000 mL (not administered)    And  sodium chloride 0.9 % bolus 500 mL (not administered)  azithromycin (ZITHROMAX) 500 mg in dextrose 5 % 250 mL IVPB (not administered)  cefTRIAXone (ROCEPHIN) 2 g in dextrose 5 % 50 mL IVPB (not administered)     Initial Impression / Assessment and Plan / ED Course  I have reviewed the triage vital signs and the nursing notes.  Pertinent labs & imaging results that were available during my care of the patient were reviewed by me and considered in my medical decision making (see chart for details).     Pt presenting for evaluation after syncopal episode.  Physical exam shows patient is tachycardic and slightly hypoxic on room air.  Temperature elevated to 101.  Otherwise physical exam reassuring.  Will obtain basic labs, troponin, EKG, chest x-ray for further evaluation.  Troponin elevated at 0.57.  Heart rate improved to 99.  Chest x-ray reviewed by me and shows possible pneumonia, waiting on radiologist read.  Will start IV fluids and antibiotics, obtain blood cultures, and lactate.  As patient has elevated troponin and shortness of breath, will obtain CTA for PE rule out.  Case discussed with attending, Dr. Jeneen Rinks evaluated the patient.  Patient signed out to Dr. Jeneen Rinks for further management.   Final Clinical Impressions(s) / ED Diagnoses   Final diagnoses:  None    ED Discharge Orders    None       Erick Alley 08/24/17 2006    Tanna Furry, MD 08/24/17 2106    Tanna Furry, MD 08/24/17 2109

## 2017-08-24 NOTE — ED Notes (Signed)
RN notified of sepsis

## 2017-08-24 NOTE — Progress Notes (Signed)
Pharmacy Antibiotic Note Joshua [name] is a 82 y.o. male admitted on 08/24/2017 with concern for pneumonia. Pharmacy has been consulted for ceftriaxone and azithromycin dosing.  Plan: 1. Ceftriaxone 2 grams IV every 24 hours 2. Azithromycin 500 mg IV every 24 hours  3. Will follow peripherally  Height: _0  (190.5 cm) Weight: 255 lb (115.7 kg) IBW/kg (Calculated) : 84.5  Temp (24hrs), Avg:101.2 F (38.4 C), Min:101.2 F (38.4 C), Max:101.2 F (38.4 C)   Allergies  Allergen Reactions  . Other     Beer- Swelling   . Sunflower Oil Swelling  . Sulfa Antibiotics Rash   Thank you for allowing pharmacy to be a part of this patient's care.  Duayne Cal 08/24/2017 7:57 PM

## 2017-08-25 ENCOUNTER — Inpatient Hospital Stay (HOSPITAL_COMMUNITY): Payer: Medicare Other

## 2017-08-25 ENCOUNTER — Encounter (HOSPITAL_COMMUNITY): Payer: Self-pay | Admitting: Internal Medicine

## 2017-08-25 DIAGNOSIS — I351 Nonrheumatic aortic (valve) insufficiency: Secondary | ICD-10-CM

## 2017-08-25 DIAGNOSIS — I248 Other forms of acute ischemic heart disease: Secondary | ICD-10-CM | POA: Diagnosis present

## 2017-08-25 DIAGNOSIS — N138 Other obstructive and reflux uropathy: Secondary | ICD-10-CM | POA: Diagnosis present

## 2017-08-25 DIAGNOSIS — I5032 Chronic diastolic (congestive) heart failure: Secondary | ICD-10-CM | POA: Diagnosis present

## 2017-08-25 DIAGNOSIS — R55 Syncope and collapse: Secondary | ICD-10-CM

## 2017-08-25 DIAGNOSIS — I5030 Unspecified diastolic (congestive) heart failure: Secondary | ICD-10-CM | POA: Diagnosis present

## 2017-08-25 DIAGNOSIS — I5031 Acute diastolic (congestive) heart failure: Secondary | ICD-10-CM

## 2017-08-25 DIAGNOSIS — N401 Enlarged prostate with lower urinary tract symptoms: Secondary | ICD-10-CM | POA: Diagnosis present

## 2017-08-25 DIAGNOSIS — R748 Abnormal levels of other serum enzymes: Secondary | ICD-10-CM

## 2017-08-25 LAB — ECHOCARDIOGRAM COMPLETE
Height: 75 in
Weight: 4080 oz

## 2017-08-25 LAB — CBC
HCT: 42.5 % (ref 39.0–52.0)
Hemoglobin: 13.6 g/dL (ref 13.0–17.0)
MCH: 28.2 pg (ref 26.0–34.0)
MCHC: 32 g/dL (ref 30.0–36.0)
MCV: 88.2 fL (ref 78.0–100.0)
Platelets: 150 10*3/uL (ref 150–400)
RBC: 4.82 MIL/uL (ref 4.22–5.81)
RDW: 14.7 % (ref 11.5–15.5)
WBC: 10.4 10*3/uL (ref 4.0–10.5)

## 2017-08-25 LAB — BASIC METABOLIC PANEL
Anion gap: 10 (ref 5–15)
BUN: 16 mg/dL (ref 6–20)
CO2: 22 mmol/L (ref 22–32)
Calcium: 8.1 mg/dL — ABNORMAL LOW (ref 8.9–10.3)
Chloride: 105 mmol/L (ref 101–111)
Creatinine, Ser: 1.19 mg/dL (ref 0.61–1.24)
GFR calc Af Amer: >60 mL/min (ref >60)
GFR calc non Af Amer: 56 mL/min — ABNORMAL LOW (ref >60)
Glucose, Bld: 91 mg/dL (ref 65–99)
Potassium: 3.8 mmol/L (ref 3.5–5.1)
Sodium: 137 mmol/L (ref 135–145)

## 2017-08-25 LAB — TROPONIN I
Troponin I: 0.61 ng/mL (ref <0.03)
Troponin I: 0.56 ng/mL (ref <0.03)
Troponin I: 0.52 ng/mL (ref <0.03)

## 2017-08-25 LAB — RESPIRATORY PANEL BY PCR

## 2017-08-25 LAB — HEPARIN LEVEL (UNFRACTIONATED)
Heparin Unfractionated: 0.19 IU/mL — ABNORMAL LOW (ref 0.30–0.70)
Heparin Unfractionated: 0.42 IU/mL (ref 0.30–0.70)

## 2017-08-25 LAB — PROCALCITONIN: Procalcitonin: 0.66 ng/mL

## 2017-08-25 LAB — INFLUENZA PANEL BY PCR (TYPE A & B)
Influenza A By PCR: NEGATIVE
Influenza B By PCR: NEGATIVE

## 2017-08-25 LAB — STREP PNEUMONIAE URINARY ANTIGEN: Strep Pneumo Urinary Antigen: NEGATIVE

## 2017-08-25 LAB — HIV ANTIBODY (ROUTINE TESTING W REFLEX): HIV Screen 4th Generation wRfx: NONREACTIVE

## 2017-08-25 LAB — CREATININE, URINE, RANDOM: Creatinine, Urine: 129.68 mg/dL

## 2017-08-25 LAB — MRSA PCR SCREENING: MRSA by PCR: NEGATIVE

## 2017-08-25 MED ORDER — HEPARIN BOLUS VIA INFUSION
4000.0000 [IU] | Freq: Once | INTRAVENOUS | Status: AC
  Administered 2017-08-25: 4000 [IU] via INTRAVENOUS
  Filled 2017-08-25: qty 4000

## 2017-08-25 MED ORDER — IPRATROPIUM-ALBUTEROL 0.5-2.5 (3) MG/3ML IN SOLN: 3.0000 mL | RESPIRATORY_TRACT | Status: DC | PRN

## 2017-08-25 MED ORDER — FUROSEMIDE 10 MG/ML IJ SOLN
40.0000 mg | Freq: Two times a day (BID) | INTRAMUSCULAR | Status: DC
  Administered 2017-08-25 – 2017-08-27 (×5): 40 mg via INTRAVENOUS
  Filled 2017-08-25 (×5): qty 4

## 2017-08-25 MED ORDER — HEPARIN (PORCINE) IN NACL 100-0.45 UNIT/ML-% IJ SOLN
1700.0000 [IU]/h | INTRAMUSCULAR | Status: DC
  Administered 2017-08-25: 1400 [IU]/h via INTRAVENOUS
  Administered 2017-08-25 – 2017-08-26 (×2): 1700 [IU]/h via INTRAVENOUS
  Filled 2017-08-25 (×4): qty 250

## 2017-08-25 MED ORDER — SODIUM CHLORIDE 0.9 % IV SOLN
INTRAVENOUS | Status: DC
  Administered 2017-08-25: 01:00:00 via INTRAVENOUS

## 2017-08-25 MED ORDER — OXYCODONE-ACETAMINOPHEN 5-325 MG PO TABS
1.0000 | ORAL_TABLET | Freq: Four times a day (QID) | ORAL | Status: DC | PRN
  Administered 2017-08-25 – 2017-08-27 (×4): 1 via ORAL
  Filled 2017-08-25 (×4): qty 1

## 2017-08-25 NOTE — Progress Notes (Signed)
  Echocardiogram 2D Echocardiogram has been performed.  Bobbye Charleston 08/25/2017, 4:02 PM

## 2017-08-25 NOTE — Progress Notes (Signed)
PROGRESS NOTE    Joshua [name]  UUV:[id number] DOB: 02/17/1936 DOA: 08/24/2017 PCP: Jani Gravel, MD     Brief Narrative:  Joshua [name] is a 82 y.o. male with medical history significant of HTN, COPD, GERD, h/o provoked PE now off anticoagulation; who presents after waking up on the floor. Patient had walked up 1-2 steps prior to onset of symptoms and reported feeling increased shortness of breath prior to passing out. He denies having any significant trauma to his head, but did hit his back.  His wife states that he was only out for a few seconds, if that, and appeared in a daze when she found him.  Over the last few days, he noted complaints of generalized malaise and fatigue.  Upon EMS arrival patient was noted to be more alert O2 saturations 90% on 3 L.  Patient reports previous history of pulmonary embolus for which he was placed on Xarelto for 1 year, but discontinued as it was thought that it was provoked.  Family history is for his brother having a heart attack at around the age of 58. Patient used to smoke tobacco tobacco use in the past, but quit back in 2007. In the ED, patient found to have fever 101.2, HR 107, RR 29. Work up revealed BNP 1253.8, elevated troponin 0.4. Cardiology was consulted and patient admitted for CAP and CHF.   Assessment & Plan:   Active Problems:   COPD (chronic obstructive pulmonary disease) (HCC)   Essential hypertension   Sepsis (HCC)   BPH with urinary obstruction   Syncope, vasovagal   Demand ischemia of myocardium (HCC)   Acute diastolic (congestive) heart failure (HCC)   Syncope -Telemetry monitoring, echocardiogram is pending  Sepsis secondary to lobar pneumonia -Presented with F 101.2, HR 107, procalcitonin 0.66, lactic acid 2.08  -CTA chest: ground-glass opacity seen in right lower base  -Respiratory PCR panel negative, strep pneumo Ag negative  -Blood cultures pending  -Ceftriaxone, azithromycin  NSTEMI -In setting of fluid overload  and sepsis -Troponin peak 0.61 -Cardiology consulted -Continue heparin drip  -Echocardiogram pending -Aspirin   Acute hypoxemic respiratory failure -Secondary to CHF exacerbation and pneumonia -Continue nasal cannula O2, wean as able   Acute on chronic diastolic CHF -BNP 4742 on admission  -Echocardiogram pending -Continue IV Lasix 70m BID   Acute kidney injury  -Cr 1.61 on admission, now resolved. Monitor BMP   Acute back pain -Secondary to fall -Thoracic x-ray unremarkable for fracture  BPH -Continue doxazosin -Self caths at home  GERD -Continue PPI  History of pulmonary embolism -Completed after 1 year of Xarelto, it was discontinued as PE was thought to be provoked in setting of knee surgery  Incidental pulmonary nodule -Left upper lobe pulmonary nodules, largest 9 x 6 mm (mean 8 mm). Focal ground-glass opacity in the left upper lobe. Enlarged right infrahilar nodes versus central pulmonary lesion. These findings are all nonspecific, new from exam 2 years prior. Initial follow-up with CT at 6-12 months is recommended to confirm persistence.   DVT prophylaxis: Heparin drip Code Status: Full Family Communication: Family at bedside Disposition Plan: Pending stabilization   Consultants:   Cardiology  Procedures:   None  Antimicrobials:  Anti-infectives (From admission, onward)   Start     Dose/Rate Route Frequency Ordered Stop   08/25/17 2200  azithromycin (ZITHROMAX) 500 mg in dextrose 5 % 250 mL IVPB  Status:  Discontinued     500 mg 250 mL/hr over 60 Minutes Intravenous  Once 08/24/17 2358 08/25/17 0005   08/24/17 2000  cefTRIAXone (ROCEPHIN) 1 g in dextrose 5 % 50 mL IVPB  Status:  Discontinued     1 g 100 mL/hr over 30 Minutes Intravenous  Once 08/24/17 1949 08/24/17 1957   08/24/17 2000  azithromycin (ZITHROMAX) 500 mg in dextrose 5 % 250 mL IVPB  Status:  Discontinued     500 mg 250 mL/hr over 60 Minutes Intravenous  Once 08/24/17 1949 08/24/17  1957   08/24/17 2000  cefTRIAXone (ROCEPHIN) 1 g in dextrose 5 % 50 mL IVPB  Status:  Discontinued     1 g 100 mL/hr over 30 Minutes Intravenous Every 24 hours 08/24/17 1957 08/24/17 1958   08/24/17 2000  azithromycin (ZITHROMAX) 500 mg in dextrose 5 % 250 mL IVPB     500 mg 250 mL/hr over 60 Minutes Intravenous Every 24 hours 08/24/17 1957 08/29/17 1959   08/24/17 2000  cefTRIAXone (ROCEPHIN) 2 g in dextrose 5 % 50 mL IVPB     2 g 100 mL/hr over 30 Minutes Intravenous Every 24 hours 08/24/17 1958 08/31/17 1959       Subjective: Patient feeling well this morning.  He states that he has been urinating a lot with Lasix.  He admits to shortness of breath with exertion but not at rest.  He does sleep with 2 pillows at nighttime due to reflux.  He denies any chest pain, fevers, chills, worsening cough from his usual, nausea, vomiting, diarrhea, dysuria.  No worsening peripheral edema.  Objective: Vitals:   08/25/17 1100 08/25/17 1200 08/25/17 1300 08/25/17 1401  BP: 132/85 (!) 152/83 (!) 143/96 (!) 157/99  Pulse: 89 80 84 86  Resp: _0 Temp:      TempSrc:      SpO2: 92% 96% 95% 95%  Weight:      Height:        Intake/Output Summary (Last 24 hours) at 08/25/2017 1436 Last data filed at 08/25/2017 1204 Gross per 24 hour  Intake 3950 ml  Output 2100 ml  Net 1850 ml   Filed Weights   08/24/17 1912  Weight: 115.7 kg (255 lb)    Examination:  General exam: Appears calm and comfortable  Respiratory system: Clear to auscultation. Respiratory effort normal. Cardiovascular system: S1 & S2 heard, RRR. No JVD, murmurs, rubs, gallops or clicks. No pedal edema. Gastrointestinal system: Abdomen is nondistended, soft and nontender. No organomegaly or masses felt. Normal bowel sounds heard. Central nervous system: Alert and oriented. No focal neurological deficits. Extremities: Symmetric 5 x 5 power. Skin: No rashes, lesions or ulcers Psychiatry: Judgement and insight appear normal.  Mood & affect appropriate.   Data Reviewed: I have personally reviewed following labs and imaging studies  CBC: Recent Labs  Lab 08/24/17 1924 08/25/17 0250  WBC 11.8* 10.4  HGB 14.1 13.6  HCT 44.2 42.5  MCV 88.9 88.2  PLT 145* 658   Basic Metabolic Panel: Recent Labs  Lab 08/24/17 1924 08/25/17 0250  NA 135 137  K 4.0 3.8  CL 99* 105  CO2 28 22  GLUCOSE 155* 91  BUN 21* 16  CREATININE 1.61* 1.19  CALCIUM 8.5* 8.1*   GFR: Estimated Creatinine Clearance: 69.1 mL/min (by C-G formula based on SCr of 1.19 mg/dL). Liver Function Tests: Recent Labs  Lab 08/24/17 1924  AST 41  ALT 27  ALKPHOS 62  BILITOT 1.1  PROT 6.2*  ALBUMIN 2.9*   No results for input(s): LIPASE, AMYLASE  in the last 168 hours. No results for input(s): AMMONIA in the last 168 hours. Coagulation Profile: No results for input(s): INR, PROTIME in the last 168 hours. Cardiac Enzymes: Recent Labs  Lab 08/24/17 2005 08/25/17 0114 08/25/17 0250 08/25/17 0537  TROPONINI 0.40* 0.61* 0.56* 0.52*   BNP (last 3 results) No results for input(s): PROBNP in the last 8760 hours. HbA1C: No results for input(s): HGBA1C in the last 72 hours. CBG: No results for input(s): GLUCAP in the last 168 hours. Lipid Profile: No results for input(s): CHOL, HDL, LDLCALC, TRIG, CHOLHDL, LDLDIRECT in the last 72 hours. Thyroid Function Tests: No results for input(s): TSH, T4TOTAL, FREET4, T3FREE, THYROIDAB in the last 72 hours. Anemia Panel: No results for input(s): VITAMINB12, FOLATE, FERRITIN, TIBC, IRON, RETICCTPCT in the last 72 hours. Sepsis Labs: Recent Labs  Lab 08/24/17 2041 08/24/17 2229 08/25/17 0114  PROCALCITON  --   --  0.66  LATICACIDVEN 2.08* 2.13*  --     Recent Results (from the past 240 hour(s))  Blood Culture (routine x 2)     Status: None (Preliminary result)   Collection Time: 08/24/17  7:54 PM  Result Value Ref Range Status   Specimen Description BLOOD BLOOD LEFT FOREARM  Final    Special Requests   Final    BOTTLES DRAWN AEROBIC AND ANAEROBIC Blood Culture adequate volume   Culture NO GROWTH < 24 HOURS  Final   Report Status PENDING  Incomplete  Blood Culture (routine x 2)     Status: None (Preliminary result)   Collection Time: 08/24/17  8:05 PM  Result Value Ref Range Status   Specimen Description BLOOD RIGHT ANTECUBITAL  Final   Special Requests   Final    BOTTLES DRAWN AEROBIC AND ANAEROBIC Blood Culture adequate volume   Culture NO GROWTH < 24 HOURS  Final   Report Status PENDING  Incomplete  Respiratory Panel by PCR     Status: None   Collection Time: 08/24/17  8:31 PM  Result Value Ref Range Status   Adenovirus NOT DETECTED NOT DETECTED Final   Coronavirus 229E NOT DETECTED NOT DETECTED Final   Coronavirus HKU1 NOT DETECTED NOT DETECTED Final   Coronavirus NL63 NOT DETECTED NOT DETECTED Final   Coronavirus OC43 NOT DETECTED NOT DETECTED Final   Metapneumovirus NOT DETECTED NOT DETECTED Final   Rhinovirus / Enterovirus NOT DETECTED NOT DETECTED Final   Influenza A NOT DETECTED NOT DETECTED Final   Influenza B NOT DETECTED NOT DETECTED Final   Parainfluenza Virus 1 NOT DETECTED NOT DETECTED Final   Parainfluenza Virus 2 NOT DETECTED NOT DETECTED Final   Parainfluenza Virus 3 NOT DETECTED NOT DETECTED Final   Parainfluenza Virus 4 NOT DETECTED NOT DETECTED Final   Respiratory Syncytial Virus NOT DETECTED NOT DETECTED Final   Bordetella pertussis NOT DETECTED NOT DETECTED Final   Chlamydophila pneumoniae NOT DETECTED NOT DETECTED Final   Mycoplasma pneumoniae NOT DETECTED NOT DETECTED Final       Radiology Studies: Dg Chest 2 View  Result Date: 08/24/2017 CLINICAL DATA:  Hypoxia.  Syncope. EXAM: CHEST  2 VIEW COMPARISON:  01/09/2017 FINDINGS: The heart is enlarged. Unchanged mediastinal contours. Moderate pulmonary edema. Small pleural effusions. No confluent airspace disease. No pneumothorax. No acute osseous abnormalities. IMPRESSION: Mild to  moderate CHF. Electronically Signed   By: Jeb Levering M.D.   On: 08/24/2017 21:00   Dg Thoracic Spine W/swimmers  Result Date: 08/25/2017 CLINICAL DATA:  Fall today with mid back pain. EXAM: THORACIC  SPINE - 3 VIEWS COMPARISON:  Chest CT 4 hours prior. FINDINGS: The alignment is maintained. Vertebral body heights are maintained. No evidence of acute fracture. Mild disc space narrowing and endplate spurring as seen on CT. Posterior elements appear intact. There is no paravertebral soft tissue abnormality. IMPRESSION: No fracture of the thoracic spine. Electronically Signed   By: Jeb Levering M.D.   On: 08/25/2017 02:19   Ct Angio Chest Pe W/cm &/or Wo Cm  Result Date: 08/24/2017 CLINICAL DATA:  PE suspected, high pretest prob. Shortness of breath. Syncope leading to fall. EXAM: CT ANGIOGRAPHY CHEST WITH CONTRAST TECHNIQUE: Multidetector CT imaging of the chest was performed using the standard protocol during bolus administration of intravenous contrast. Multiplanar CT image reconstructions and MIPs were obtained to evaluate the vascular anatomy. CONTRAST:  193m ISOVUE-370 IOPAMIDOL (ISOVUE-370) INJECTION 76% COMPARISON:  Chest radiograph earlier this day.  Chest CT 09/15/2014 FINDINGS: Cardiovascular: Breathing motion artifact obscures lower lobe evaluation. Prior bilateral pulmonary emboli have resolved. No residual or new filling defects in the pulmonary arteries. Prominent main pulmonary artery measuring 3.8 cm. Multi chamber cardiomegaly. There are coronary artery calcifications. Atherosclerosis of the thoracic aorta without evidence of dissection. Mediastinum/Nodes: Ovoid right infrahilar low-density measuring 19 x 12 mm likely an infrahilar lymph node, central pulmonary lesion is also considered. Additional shotty mediastinal nodes. Small to moderate hiatal hernia. No thyroid nodule. Lungs/Pleura: Emphysema. Breathing motion artifact obscures lower lobe evaluation. Mild upper lobe septal  thickening suspicious for pulmonary edema. Ill-defined right upper lobe ground-glass opacity image 40 series 7 is new from prior exam. 9 x 6 mm left upper lobe pulmonary nodule image 67 series 7 is also new from prior exam. Small clustered nodules slightly more inferiorly in the left upper lobe. Patchy ground-glass opacities in the right lung base are obscured by motion. Minimal pleural fluid or thickening bilaterally, right greater than left. Upper Abdomen: No acute abnormality. Small to moderate hiatal hernia. Musculoskeletal: There are no acute or suspicious osseous abnormalities. Review of the MIP images confirms the above findings. IMPRESSION: 1. No acute pulmonary embolus. Previous bilateral pulmonary emboli have resolved, lower lobe subsegmental evaluation obscured by breathing motion. 2. Cardiomegaly with mild pulmonary edema. 3. Aortic atherosclerosis and coronary artery calcifications. 4. Left upper lobe pulmonary nodules, largest 9 x 6 mm (mean 8 mm). Focal ground-glass opacity in the left upper lobe. Enlarged right infrahilar nodes versus central pulmonary lesion. These findings are all nonspecific, new from exam 2 years prior. Initial follow-up with CT at 6-12 months is recommended to confirm persistence. 5. Emphysema. Aortic Atherosclerosis (ICD10-I70.0) and Emphysema (ICD10-J43.9). Electronically Signed   By: MJeb LeveringM.D.   On: 08/24/2017 22:20      Scheduled Meds: . allopurinol  100 mg Oral Daily  . aspirin EC  81 mg Oral Daily  . doxazosin  4 mg Oral QHS  . furosemide  40 mg Intravenous BID  .  morphine injection  2 mg Intravenous STAT  . pantoprazole  40 mg Oral Daily   Continuous Infusions: . azithromycin Stopped (08/24/17 2133)  . cefTRIAXone (ROCEPHIN)  IV Stopped (08/24/17 2131)  . heparin 1,700 Units/hr (08/25/17 1331)     LOS: 1 day    Time spent: 40 minutes   JDessa Phi DO [facility] [email or web address] Password [email or web address] [facility] Center1/02/2018, 2:36 PM

## 2017-08-25 NOTE — Consult Note (Addendum)
CARDIOLOGY CONSULT NOTE   Referring Physician: Dr. Harvest Forest Reason for Consultation: Elevated troponin  HPI: Joshua [name] is a 82 y.o. male w/ history of COPD, VTE (formerly on NOAC, course completed), and HTN presenting with syncope. Cardiology is consulted for elevated troponin.  In brief, the patient was walking upstairs earlier today and began to feel breathless. He next remembers waking up on the floor with his wife kneeling over him. He does not recall any prodromal palpitations or chest pain. He recalls having respiratory infection symptoms 2 or 3 days ago with some cough and shortness of breath, though does not recall any fevers, chills, or sputum production. He is modestly active at baseline and never gets chest pain or chest pressure. He does have a history of indigestion from a known hiatal hernia, though these symptoms are unrelated to exertion.   While in the ED the patient was thought to have signs of pneumonia. He was given IV fluid and antibiotics. His troponin came back elevated at 0.61, thus cardiology was consulted.    Review of Systems:     Cardiac Review of Systems: {Y] = yes _0  = no  Chest Pain [    ]  Resting SOB [   ] Exertional SOB  [  ]  Orthopnea [  ]   Pedal Edema [ X  ]    Palpitations [  ] Syncope  [ X ]   Presyncope [   ]  General Review of Systems: [Y] = yes [  ]=no Constitional: recent weight change [  ]; anorexia [  ]; fatigue [  ]; nausea [  ]; night sweats [  ]; fever [  ]; or chills [  ];                                                                     Eyes : blurred vision [  ]; diplopia [   ]; vision changes [  ];  Amaurosis fugax[  ]; Resp: cough [  ];  wheezing[  ];  hemoptysis[  ];  PND [  ];  GI:  gallstones[  ], vomiting[  ];  dysphagia[  ]; melena[  ];  hematochezia [  ]; heartburn[  ];   GU: kidney stones [  ]; hematuria[  ];   dysuria [  ];  nocturia[  ]; incontinence [  ];             Skin: rash, swelling[  ];, hair loss[  ];   peripheral edema[  ];  or itching[  ]; Musculosketetal: myalgias[  ];  joint swelling[  ];  joint erythema[  ];  joint pain[  ];  back pain[  ];  Heme/Lymph: bruising[  ];  bleeding[  ];  anemia[  ];  Neuro: TIA[  ];  headaches[  ];  stroke[  ];  vertigo[  ];  seizures[  ];   paresthesias[  ];  difficulty walking[  ];  Psych:depression[  ]; anxiety[  ];  Endocrine: diabetes[  ];  thyroid dysfunction[  ];  Other:  Past Medical History:  Diagnosis Date  . Arthritis   . COPD (chronic obstructive pulmonary disease) ([place])   . Folliculitis  posterior scalp per office visit note of Dr Maudie Mercury 07/20/2014    . GERD (gastroesophageal reflux disease)   . Hypertension   . Phlebitis    right arm  at least 20 years ago   . Pneumonia    hx of pneumonia as a child      (Not in a hospital admission)   . allopurinol  100 mg Oral Daily  . aspirin EC  81 mg Oral Daily  . doxazosin  4 mg Oral QHS  .  morphine injection  2 mg Intravenous STAT  . pantoprazole  40 mg Oral Daily    Infusions: . azithromycin Stopped (08/24/17 2133)  . cefTRIAXone (ROCEPHIN)  IV Stopped (08/24/17 2131)  . heparin 1,400 Units/hr (08/25/17 0040)    Allergies  Allergen Reactions  . Other     Beer- Swelling   . Sunflower Oil Swelling  . Sulfa Antibiotics Rash    Social History   Socioeconomic History  . Marital status: Married    Spouse name: Not on file  . Number of children: Not on file  . Years of education: Not on file  . Highest education level: Not on file  Social Needs  . Financial resource strain: Not on file  . Food insecurity - worry: Not on file  . Food insecurity - inability: Not on file  . Transportation needs - medical: Not on file  . Transportation needs - non-medical: Not on file  Occupational History  . Not on file  Tobacco Use  . Smoking status: Former Smoker    Packs/day: 1.00    Years: 50.00    Pack years: 50.00    Types: Cigarettes    Last attempt to quit: 08/19/2006    Years  since quitting: 11.0  . Smokeless tobacco: Never Used  Substance and Sexual Activity  . Alcohol use: Yes    Alcohol/week: 3.6 oz    Types: 6 Shots of liquor per week  . Drug use: No  . Sexual activity: Not on file  Other Topics Concern  . Not on file  Social History Narrative  . Not on file    Family History  Problem Relation Age of Onset  . Heart attack Brother 57    PHYSICAL EXAM: Vitals:   08/25/17 0225 08/25/17 0226  BP:    Pulse: 90 88  Resp: 15 19  Temp:    SpO2: 95% 93%     Intake/Output Summary (Last 24 hours) at 08/25/2017 0244 Last data filed at 08/24/2017 2221 Gross per 24 hour  Intake 3800 ml  Output -  Net 3800 ml    General:  Well appearing. No respiratory difficulty. Comfortable. NAD HEENT: normal Neck: supple. JVP elevated to 12 cm H2O.  Cor: PMI nondisplaced. Regular rate & rhythm. +S4.  Lungs: faint crackles throughout Abdomen: soft, nontender, nondistended. No hepatosplenomegaly. No bruits or masses. Good bowel sounds. Extremities: no cyanosis, clubbing, rash, edema Neuro: alert & oriented x 3, cranial nerves grossly intact. moves all 4 extremities w/o difficulty. Affect pleasant.  ECG: Sinus tach, RBBB, nonspecific ST/T wave changes  Results for orders placed or performed during the hospital encounter of 08/24/17 (from the past 24 hour(s))  CBC     Status: Abnormal   Collection Time: 08/24/17  7:24 PM  Result Value Ref Range   WBC 11.8 (H) 4.0 - 10.5 K/uL   RBC 4.97 4.22 - 5.81 MIL/uL   Hemoglobin 14.1 13.0 - 17.0 g/dL   HCT 44.2 39.0 - 52.0 %  MCV 88.9 78.0 - 100.0 fL   MCH 28.4 26.0 - 34.0 pg   MCHC 31.9 30.0 - 36.0 g/dL   RDW 14.8 11.5 - 15.5 %   Platelets 145 (L) 150 - 400 K/uL  Comprehensive metabolic panel     Status: Abnormal   Collection Time: 08/24/17  7:24 PM  Result Value Ref Range   Sodium 135 135 - 145 mmol/L   Potassium 4.0 3.5 - 5.1 mmol/L   Chloride 99 (L) 101 - 111 mmol/L   CO2 28 22 - 32 mmol/L   Glucose, Bld 155  (H) 65 - 99 mg/dL   BUN 21 (H) 6 - 20 mg/dL   Creatinine, Ser 1.61 (H) 0.61 - 1.24 mg/dL   Calcium 8.5 (L) 8.9 - 10.3 mg/dL   Total Protein 6.2 (L) 6.5 - 8.1 g/dL   Albumin 2.9 (L) 3.5 - 5.0 g/dL   AST 41 15 - 41 U/L   ALT 27 17 - 63 U/L   Alkaline Phosphatase 62 38 - 126 U/L   Total Bilirubin 1.1 0.3 - 1.2 mg/dL   GFR calc non Af Amer 39 (L) >60 mL/min   GFR calc Af Amer 45 (L) >60 mL/min   Anion gap 8 5 - 15  I-Stat Troponin, ED (not at [facility])     Status: Abnormal   Collection Time: 08/24/17  7:42 PM  Result Value Ref Range   Troponin i, poc 0.57 (HH) 0.00 - 0.08 ng/mL   Comment NOTIFIED PHYSICIAN    Comment 3          Troponin I     Status: Abnormal   Collection Time: 08/24/17  8:05 PM  Result Value Ref Range   Troponin I 0.40 (HH) <0.03 ng/mL  Influenza panel by PCR (type A & B)     Status: None   Collection Time: 08/24/17  8:31 PM  Result Value Ref Range   Influenza A By PCR NEGATIVE NEGATIVE   Influenza B By PCR NEGATIVE NEGATIVE  I-Stat CG4 Lactic Acid, ED  (not at  [facility])     Status: Abnormal   Collection Time: 08/24/17  8:41 PM  Result Value Ref Range   Lactic Acid, Venous 2.08 (HH) 0.5 - 1.9 mmol/L   Comment NOTIFIED PHYSICIAN   Urinalysis, Routine w reflex microscopic     Status: Abnormal   Collection Time: 08/24/17  9:38 PM  Result Value Ref Range   Color, Urine YELLOW YELLOW   APPearance CLEAR CLEAR   Specific Gravity, Urine 1.020 1.005 - 1.030   pH 5.0 5.0 - 8.0   Glucose, UA NEGATIVE NEGATIVE mg/dL   Hgb urine dipstick NEGATIVE NEGATIVE   Bilirubin Urine NEGATIVE NEGATIVE   Ketones, ur 5 (A) NEGATIVE mg/dL   Protein, ur 30 (A) NEGATIVE mg/dL   Nitrite NEGATIVE NEGATIVE   Leukocytes, UA NEGATIVE NEGATIVE   RBC / HPF 0-5 0 - 5 RBC/hpf   WBC, UA 0-5 0 - 5 WBC/hpf   Bacteria, UA NONE SEEN NONE SEEN   Squamous Epithelial / LPF 0-5 (A) NONE SEEN   Mucus PRESENT    Hyaline Casts, UA PRESENT   Brain natriuretic peptide     Status: Abnormal   Collection Time:  08/24/17 10:10 PM  Result Value Ref Range   B Natriuretic Peptide 1,253.8 (H) 0.0 - 100.0 pg/mL  I-Stat CG4 Lactic Acid, ED  (not at  [facility])     Status: Abnormal   Collection Time: 08/24/17 10:29 PM  Result  Value Ref Range   Lactic Acid, Venous 2.13 (HH) 0.5 - 1.9 mmol/L   Comment NOTIFIED PHYSICIAN   Strep pneumoniae urinary antigen     Status: None   Collection Time: 08/24/17 11:59 PM  Result Value Ref Range   Strep Pneumo Urinary Antigen NEGATIVE NEGATIVE  Creatinine, urine, random     Status: None   Collection Time: 08/24/17 11:59 PM  Result Value Ref Range   Creatinine, Urine 129.68 mg/dL  Troponin I (q 6hr x 3)     Status: Abnormal   Collection Time: 08/25/17  1:14 AM  Result Value Ref Range   Troponin I 0.61 (HH) <0.03 ng/mL  Procalcitonin     Status: None   Collection Time: 08/25/17  1:14 AM  Result Value Ref Range   Procalcitonin 0.66 ng/mL   Dg Chest 2 View  Result Date: 08/24/2017 CLINICAL DATA:  Hypoxia.  Syncope. EXAM: CHEST  2 VIEW COMPARISON:  01/09/2017 FINDINGS: The heart is enlarged. Unchanged mediastinal contours. Moderate pulmonary edema. Small pleural effusions. No confluent airspace disease. No pneumothorax. No acute osseous abnormalities. IMPRESSION: Mild to moderate CHF. Electronically Signed   By: Jeb Levering M.D.   On: 08/24/2017 21:00   Dg Thoracic Spine W/swimmers  Result Date: 08/25/2017 CLINICAL DATA:  Fall today with mid back pain. EXAM: THORACIC SPINE - 3 VIEWS COMPARISON:  Chest CT 4 hours prior. FINDINGS: The alignment is maintained. Vertebral body heights are maintained. No evidence of acute fracture. Mild disc space narrowing and endplate spurring as seen on CT. Posterior elements appear intact. There is no paravertebral soft tissue abnormality. IMPRESSION: No fracture of the thoracic spine. Electronically Signed   By: Jeb Levering M.D.   On: 08/25/2017 02:19   Ct Angio Chest Pe W/cm &/or Wo Cm  Result Date: 08/24/2017 CLINICAL DATA:  PE  suspected, high pretest prob. Shortness of breath. Syncope leading to fall. EXAM: CT ANGIOGRAPHY CHEST WITH CONTRAST TECHNIQUE: Multidetector CT imaging of the chest was performed using the standard protocol during bolus administration of intravenous contrast. Multiplanar CT image reconstructions and MIPs were obtained to evaluate the vascular anatomy. CONTRAST:  125m ISOVUE-370 IOPAMIDOL (ISOVUE-370) INJECTION 76% COMPARISON:  Chest radiograph earlier this day.  Chest CT 09/15/2014 FINDINGS: Cardiovascular: Breathing motion artifact obscures lower lobe evaluation. Prior bilateral pulmonary emboli have resolved. No residual or new filling defects in the pulmonary arteries. Prominent main pulmonary artery measuring 3.8 cm. Multi chamber cardiomegaly. There are coronary artery calcifications. Atherosclerosis of the thoracic aorta without evidence of dissection. Mediastinum/Nodes: Ovoid right infrahilar low-density measuring 19 x 12 mm likely an infrahilar lymph node, central pulmonary lesion is also considered. Additional shotty mediastinal nodes. Small to moderate hiatal hernia. No thyroid nodule. Lungs/Pleura: Emphysema. Breathing motion artifact obscures lower lobe evaluation. Mild upper lobe septal thickening suspicious for pulmonary edema. Ill-defined right upper lobe ground-glass opacity image 40 series 7 is new from prior exam. 9 x 6 mm left upper lobe pulmonary nodule image 67 series 7 is also new from prior exam. Small clustered nodules slightly more inferiorly in the left upper lobe. Patchy ground-glass opacities in the right lung base are obscured by motion. Minimal pleural fluid or thickening bilaterally, right greater than left. Upper Abdomen: No acute abnormality. Small to moderate hiatal hernia. Musculoskeletal: There are no acute or suspicious osseous abnormalities. Review of the MIP images confirms the above findings. IMPRESSION: 1. No acute pulmonary embolus. Previous bilateral pulmonary emboli have  resolved, lower lobe subsegmental evaluation obscured by breathing motion. 2.  Cardiomegaly with mild pulmonary edema. 3. Aortic atherosclerosis and coronary artery calcifications. 4. Left upper lobe pulmonary nodules, largest 9 x 6 mm (mean 8 mm). Focal ground-glass opacity in the left upper lobe. Enlarged right infrahilar nodes versus central pulmonary lesion. These findings are all nonspecific, new from exam 2 years prior. Initial follow-up with CT at 6-12 months is recommended to confirm persistence. 5. Emphysema. Aortic Atherosclerosis (ICD10-I70.0) and Emphysema (ICD10-J43.9). Electronically Signed   By: Jeb Levering M.D.   On: 08/24/2017 22:20    ASSESSMENT: Joshua [name] is a 82 y.o. male w/ history of COPD, VTE (formerly on NOAC, course completed), and HTN presenting with syncope, found to have an elevated troponin.   The patient's constellation of fever, leukocytosis, and chest xray findings all point toward pneumonia as a likely diagnosis. He presents with evidence of sepsis with a mild AKI (Cr 1.6) and elevated troponin to 0.61. He endorses no symptoms suggestive of cardiac ischemia and has no history of CAD. It is possible that his troponin elevation is solely attributable to underlying sepsis, however he has risk factors for CAD and thus a primary coronary event must be considered. Outside of possible malignant arrhythmia, it is unclear how ACS or pneumonia could have caused the syncopal episode that brought him to the hospital in the first place.   PLAN/DISCUSSION: - give full dose aspirin (35m) followed by 860mdaily - start heparin gtt per pharmacy protocol for ACS - order TTE to be done in AM - check lipid panel, A1c - patient now appears quite volume overloaded on exam, would defer additional IV fluids unless strongly indicated - hold home ramipril for now - hold home lasix for now, though will certainly need IV diuretics for several days prior to discharge - obtain  additional troponin 6 hours after first - start high intensity statin, atorvastatin 8095mHS - once sepsis stabilized, recommend starting low dose beta blocker such as metoprolol succinate 79m35mily  AnthMarcie Mowers Cardiology Fellow, PGY-5

## 2017-08-25 NOTE — Progress Notes (Signed)
ANTICOAGULATION CONSULT NOTE - Initial Consult  Pharmacy Consult for Heparin Indication: chest pain/ACS  Allergies  Allergen Reactions  . Other     Beer- Swelling   . Sunflower Oil Swelling  . Sulfa Antibiotics Rash    Patient Measurements: Height: 6' 3" (190.5 cm) Weight: 255 lb (115.7 kg) IBW/kg (Calculated) : 84.5 Heparin Dosing Weight: 110 kg  Vital Signs: Temp: 101.2 F (38.4 C) (01/06 1912) Temp Source: Oral (01/06 1912) BP: 150/88 (01/06 2345) Pulse Rate: 94 (01/06 2345)  Labs: Recent Labs    08/24/17 1924 08/24/17 2005  HGB 14.1  --   HCT 44.2  --   PLT 145*  --   CREATININE 1.61*  --   TROPONINI  --  0.40*    Estimated Creatinine Clearance: 51 mL/min (A) (by C-G formula based on SCr of 1.61 mg/dL (H)).   Medical History: Past Medical History:  Diagnosis Date  . Arthritis   . COPD (chronic obstructive pulmonary disease) ([place])   . Folliculitis    posterior scalp per office visit note of Dr Maudie Mercury 07/20/2014    . GERD (gastroesophageal reflux disease)   . Hypertension   . Phlebitis    right arm  at least 20 years ago   . Pneumonia    hx of pneumonia as a child     Medications:  No current facility-administered medications on file prior to encounter.    Current Outpatient Medications on File Prior to Encounter  Medication Sig Dispense Refill  . albuterol (PROVENTIL HFA;VENTOLIN HFA) 108 (90 Base) MCG/ACT inhaler Inhale 1-2 puffs into the lungs every 6 (six) hours as needed for wheezing or shortness of breath. 1 Inhaler 3  . allopurinol (ZYLOPRIM) 100 MG tablet Take 100 mg by mouth daily.    Marland Kitchen aspirin 81 MG tablet Take 81 mg by mouth daily.    . Cinnamon 500 MG capsule Take 500 mg by mouth daily.    Marland Kitchen doxazosin (CARDURA) 4 MG tablet Take 4 mg by mouth at bedtime. Patient takes in the pm    . furosemide (LASIX) 20 MG tablet Take 20-40 mg by mouth daily. Take 20 mg everyday on Monday, Wednesday and Friday take 40 mg    . pantoprazole (PROTONIX) 40 MG  tablet Take 40 mg by mouth daily.    . ramipril (ALTACE) 2.5 MG capsule Take 2.5 mg by mouth daily.       Assessment: 82 y.o. male admitted with syncope, elevated cardiac markers, for heparin Goal of Therapy:  Heparin level 0.3-0.7 units/ml Monitor platelets by anticoagulation protocol: Yes   Plan:  Heparin 4000 units IV bolus, then start heparin 1400 units/hr Check heparin level in 8 hours.   Caryl Pina 08/25/2017,12:00 AM

## 2017-08-25 NOTE — Progress Notes (Signed)
Pt seen by fellow early this am. He is comfortable on my evaluation. JVP markedly elevated and labs/CXR show CHF. Will diurese, check echo, and follow with you. See full consult for complete plan. Troponin trend is flat and presentation is not consistent with ACS. Will consider stress testing but not urgent and may do as outpatient pending his clinical course here.   Sherren Mocha 08/25/2017 8:01 AM

## 2017-08-25 NOTE — ED Notes (Signed)
Patient transported to X-ray 

## 2017-08-25 NOTE — Progress Notes (Signed)
[place] for Heparin Indication: chest pain/ACS  Allergies  Allergen Reactions  . Other     Beer- Swelling   . Sunflower Oil Swelling  . Sulfa Antibiotics Rash    Patient Measurements: Height: _0  (190.5 cm) Weight: 255 lb (115.7 kg) IBW/kg (Calculated) : 84.5 Heparin Dosing Weight: 110 kg  Vital Signs: Temp: 98.7 F (37.1 C) (01/07 0244) Temp Source: Oral (01/07 0244) BP: 159/92 (01/07 0926) Pulse Rate: 86 (01/07 0926)  Labs: Recent Labs    08/24/17 1924  08/25/17 0114 08/25/17 0250 08/25/17 0537 08/25/17 1016  HGB 14.1  --   --  13.6  --   --   HCT 44.2  --   --  42.5  --   --   PLT 145*  --   --  150  --   --   HEPARINUNFRC  --   --   --   --   --  0.19*  CREATININE 1.61*  --   --  1.19  --   --   TROPONINI  --    < > 0.61* 0.56* 0.52*  --    < > = values in this interval not displayed.    Estimated Creatinine Clearance: 69.1 mL/min (by C-G formula based on SCr of 1.19 mg/dL).  Assessment: 82 y.o. male presented with syncope. He continues on IV heparin for r/o NSTEMI. Initial heparin level is subtherapeutic at 0.19. No bleeding noted. CBC is WNL.   Goal of Therapy:  Heparin level 0.3-0.7 units/ml Monitor platelets by anticoagulation protocol: Yes   Plan:  Increase heparin gtt to 1700 units/hr Check an 8 hr heparin level Daily heparin level and CBC  Rumbarger, Rande Lawman 08/25/2017,11:17 AM

## 2017-08-25 NOTE — Progress Notes (Signed)
ANTICOAGULATION CONSULT NOTE - FOLLOW UP    HL = 0.42 (goal 0.3 - 0.7 units/mL) Heparin dosing weight = 110 kg   Assessment: 81 YOM on IV heparin for ACS.  Heparin level is therapeutic; no bleeding reported.   Plan: Continue heparin gtt at 1700 units/hr F/U AM labs   Thuy D. Mina Marble, PharmD, BCPS 08/25/2017, 9:43 PM

## 2017-08-26 ENCOUNTER — Other Ambulatory Visit: Payer: Self-pay

## 2017-08-26 DIAGNOSIS — I5033 Acute on chronic diastolic (congestive) heart failure: Secondary | ICD-10-CM

## 2017-08-26 DIAGNOSIS — R748 Abnormal levels of other serum enzymes: Secondary | ICD-10-CM

## 2017-08-26 LAB — CBC
HCT: 45.9 % (ref 39.0–52.0)
Hemoglobin: 14.8 g/dL (ref 13.0–17.0)
MCH: 28.7 pg (ref 26.0–34.0)
MCHC: 32.2 g/dL (ref 30.0–36.0)
MCV: 89.1 fL (ref 78.0–100.0)
Platelets: 160 10*3/uL (ref 150–400)
RBC: 5.15 MIL/uL (ref 4.22–5.81)
RDW: 14.8 % (ref 11.5–15.5)
WBC: 5.5 10*3/uL (ref 4.0–10.5)

## 2017-08-26 LAB — BASIC METABOLIC PANEL
Anion gap: 10 (ref 5–15)
BUN: 11 mg/dL (ref 6–20)
CO2: 28 mmol/L (ref 22–32)
Calcium: 9.2 mg/dL (ref 8.9–10.3)
Chloride: 99 mmol/L — ABNORMAL LOW (ref 101–111)
Creatinine, Ser: 1.04 mg/dL (ref 0.61–1.24)
GFR calc Af Amer: >60 mL/min (ref >60)
GFR calc non Af Amer: >60 mL/min (ref >60)
Glucose, Bld: 91 mg/dL (ref 65–99)
Potassium: 3.8 mmol/L (ref 3.5–5.1)
Sodium: 137 mmol/L (ref 135–145)

## 2017-08-26 LAB — LEGIONELLA PNEUMOPHILA SEROGP 1 UR AG: L. pneumophila Serogp 1 Ur Ag: NEGATIVE

## 2017-08-26 LAB — HEPARIN LEVEL (UNFRACTIONATED): Heparin Unfractionated: 0.48 IU/mL (ref 0.30–0.70)

## 2017-08-26 LAB — UREA NITROGEN, URINE: Urea Nitrogen, Ur: 882 mg/dL

## 2017-08-26 MED ORDER — CEFPODOXIME PROXETIL 200 MG PO TABS
200.0000 mg | ORAL_TABLET | Freq: Two times a day (BID) | ORAL | Status: DC
  Administered 2017-08-26 – 2017-08-27 (×3): 200 mg via ORAL
  Filled 2017-08-26 (×4): qty 1

## 2017-08-26 MED ORDER — HEPARIN SODIUM (PORCINE) 5000 UNIT/ML IJ SOLN
5000.0000 [IU] | Freq: Three times a day (TID) | INTRAMUSCULAR | Status: DC
  Administered 2017-08-26 – 2017-08-27 (×4): 5000 [IU] via SUBCUTANEOUS
  Filled 2017-08-26 (×4): qty 1

## 2017-08-26 MED ORDER — ORAL CARE MOUTH RINSE
15.0000 mL | Freq: Two times a day (BID) | OROMUCOSAL | Status: DC
  Administered 2017-08-26 – 2017-08-27 (×2): 15 mL via OROMUCOSAL

## 2017-08-26 MED ORDER — AZITHROMYCIN 500 MG PO TABS
500.0000 mg | ORAL_TABLET | Freq: Every day | ORAL | Status: DC
  Administered 2017-08-26: 500 mg via ORAL
  Filled 2017-08-26: qty 1

## 2017-08-26 NOTE — Evaluation (Signed)
Occupational Therapy Evaluation and Discharge Patient Details Name: Joshua [name] MRN: [id number] DOB: 10-11-1935 Today's Date: 08/26/2017    History of Present Illness Pt admitted after syncopal episode with fall with sepsis due to PNA, acute respiratory failure with hypoxia due to CHF exacerbation, NSTEMI, back pain from fall. PMH: HTN, COPD, BPH (self caths), PE, L TKA.   Clinical Impression   Pt with mild unsteadiness with ambulation requiring supervision to min guard assist for standing ADL. Pt likely to progress well. Will have wife available to assist as needed.  VSS throughout session.No further OT needs.    Follow Up Recommendations  No OT follow up    Equipment Recommendations  None recommended by OT    Recommendations for Other Services       Precautions / Restrictions Precautions Precautions: Fall Precaution Comments: slightly unsteady      Mobility Bed Mobility Overal bed mobility: Modified Independent             General bed mobility comments: HOB up, no assist  Transfers Overall transfer level: Needs assistance Equipment used: None Transfers: Sit to/from Stand Sit to Stand: Supervision              Balance Overall balance assessment: Needs assistance   Sitting balance-Leahy Scale: Good       Standing balance-Leahy Scale: Fair                             ADL either performed or assessed with clinical judgement   ADL Overall ADL's : Needs assistance/impaired Eating/Feeding: Independent;Sitting   Grooming: Wash/dry hands;Standing;Supervision/safety   Upper Body Bathing: Set up;Sitting   Lower Body Bathing: Supervison/ safety;Sit to/from stand   Upper Body Dressing : Set up;Sitting   Lower Body Dressing: Supervision/safety;Sit to/from stand   Toilet Transfer: Min guard;Ambulation;Regular Toilet   Toileting- Clothing Manipulation and Hygiene: Modified independent;Sit to/from stand       Functional mobility during  ADLs: Min guard General ADL Comments: HR to 126, 02 remained in mid 90s during ambulation     Vision Baseline Vision/History: Wears glasses Wears Glasses: At all times Patient Visual Report: No change from baseline       Perception     Praxis      Pertinent Vitals/Pain Pain Assessment: No/denies pain     Hand Dominance Right   Extremity/Trunk Assessment Upper Extremity Assessment Upper Extremity Assessment: Overall WFL for tasks assessed   Lower Extremity Assessment Lower Extremity Assessment: Defer to PT evaluation   Cervical / Trunk Assessment Cervical / Trunk Assessment: Normal   Communication Communication Communication: No difficulties   Cognition Arousal/Alertness: Awake/alert Behavior During Therapy: WFL for tasks assessed/performed Overall Cognitive Status: Within Functional Limits for tasks assessed                                     General Comments       Exercises     Shoulder Instructions      Home Living Family/patient expects to be discharged to:: Private residence Living Arrangements: Spouse/significant other Available Help at Discharge: Family;Available 24 hours/day Type of Home: House Home Access: Stairs to enter [employer] of Steps: 4 Entrance Stairs-Rails: Right Home Layout: Two level;Bed/bath upstairs;1/2 bath on main level Alternate Level Stairs-Number of Steps: 13 Alternate Level Stairs-Rails: Right Bathroom Shower/Tub: Occupational psychologist: Standard  Home Equipment: Kasandra Knudsen - single point   Additional Comments: wife works, school crossing guard,       Prior Functioning/Environment Level of Independence: Independent with assistive device(s);Independent        Comments: uses cane when he walks to the mailbox to protect himself from dogs        OT Problem List:        OT Treatment/Interventions:      OT Goals(Current goals can be found in the care plan section) Acute Rehab OT  Goals Patient Stated Goal: to go home  OT Frequency:     Barriers to D/C:            Co-evaluation              AM-PAC PT "6 Clicks" Daily Activity     Outcome Measure Help from another person eating meals?: None Help from another person taking care of personal grooming?: A Little Help from another person toileting, which includes using toliet, bedpan, or urinal?: A Little Help from another person bathing (including washing, rinsing, drying)?: A Little Help from another person to put on and taking off regular upper body clothing?: None Help from another person to put on and taking off regular lower body clothing?: A Little 6 Click Score: 20   End of Session Equipment Utilized During Treatment: Gait belt Nurse Communication: Mobility status  Activity Tolerance: Patient tolerated treatment well Patient left: in chair;with call bell/phone within reach;with family/visitor present  OT Visit Diagnosis: Other abnormalities of gait and mobility (R26.89)                Time: 0379-4446 OT Time Calculation (min): 21 min Charges:  OT General Charges $OT Visit: 1 Visit OT Evaluation $OT Eval Low Complexity: 1 Low G-Codes:     09/24/2017 Joshua [name], OTR/L Pager: Register, Joshua [name] 09-24-2017, 10:45 AM

## 2017-08-26 NOTE — Progress Notes (Signed)
PROGRESS NOTE    Joshua [name]  BPZ:[id number] DOB: 1935/11/26 DOA: 08/24/2017 PCP: Jani Gravel, MD     Brief Narrative:  Joshua [name] is a 82 y.o. male with medical history significant of HTN, COPD, GERD, h/o provoked PE now off anticoagulation; who presents after waking up on the floor. Patient had walked up 1-2 steps prior to onset of symptoms and reported feeling increased shortness of breath prior to passing out. He denies having any significant trauma to his head, but did hit his back.  His wife states that he was only out for a few seconds, if that, and appeared in a daze when she found him.  Over the last few days, he noted complaints of generalized malaise and fatigue.  Upon EMS arrival patient was noted to be more alert O2 saturations 90% on 3 L.  Patient reports previous history of pulmonary embolus for which he was placed on Xarelto for 1 year, but discontinued as it was thought that it was provoked.  Family history is for his brother having a heart attack at around the age of 33. Patient used to smoke tobacco tobacco use in the past, but quit back in 2007. In the ED, patient found to have fever 101.2, HR 107, RR 29. Work up revealed BNP 1253.8, elevated troponin 0.4. Cardiology was consulted and patient admitted for CAP and CHF.   Assessment & Plan:   Active Problems:   COPD (chronic obstructive pulmonary disease) (HCC)   Essential hypertension   Sepsis (HCC)   BPH with urinary obstruction   Syncope, vasovagal   Demand ischemia of myocardium (HCC)   Acute diastolic (congestive) heart failure (HCC)   Syncope -Telemetry monitoring, echocardiogram did not reveal aortic stenosis   Sepsis secondary to lobar pneumonia -Presented with F 101.2, HR 107, procalcitonin 0.66, lactic acid 2.08  -CTA chest: ground-glass opacity seen in right lower base  -Respiratory PCR panel negative, strep pneumo Ag negative  -Blood cultures negative to date  -Ceftriaxone, azithromycin, switch to  PO for total 5 day treatment   NSTEMI -In setting of fluid overload and sepsis -Troponin peak 0.61 -Cardiology consulted -Aspirin  -Echo without regional wall motion abnormalities  -Plan outpatient ischemic eval per cardiology   Acute hypoxemic respiratory failure -Secondary to CHF exacerbation and pneumonia -Continue nasal cannula O2, wean as able   Acute on chronic diastolic CHF -BNP 2423 on admission  -Echocardiogram EF 55-60%, grade 1 dd  -Continue IV Lasix 80m BID, plan to wean to PO tomorrow   Acute kidney injury  -Cr 1.61 on admission, now resolved. Monitor BMP   Acute back pain -Secondary to fall -Thoracic x-ray unremarkable for fracture  BPH -Continue doxazosin -Self caths at home  GERD -Continue PPI  History of pulmonary embolism -Completed after 1 year of Xarelto, it was discontinued as PE was thought to be provoked in setting of knee surgery  Incidental pulmonary nodule -Left upper lobe pulmonary nodules, largest 9 x 6 mm (mean 8 mm). Focal ground-glass opacity in the left upper lobe. Enlarged right infrahilar nodes versus central pulmonary lesion. These findings are all nonspecific, new from exam 2 years prior. Initial follow-up with CT at 6-12 months is recommended to confirm persistence.   DVT prophylaxis: hep subq  Code Status: Full Family Communication: No family at bedside Disposition Plan: Pending stabilization   Consultants:   Cardiology  Procedures:   None  Antimicrobials:  Anti-infectives (From admission, onward)   Start     Dose/Rate Route  Frequency Ordered Stop   08/26/17 1245  azithromycin (ZITHROMAX) tablet 500 mg     500 mg Oral Daily 08/26/17 1236 08/29/17 0959   08/26/17 1245  cefpodoxime (VANTIN) tablet 200 mg     200 mg Oral Every 12 hours 08/26/17 1236 08/29/17 0959   08/25/17 2200  azithromycin (ZITHROMAX) 500 mg in dextrose 5 % 250 mL IVPB  Status:  Discontinued     500 mg 250 mL/hr over 60 Minutes Intravenous  Once  08/24/17 2358 08/25/17 0005   08/24/17 2000  cefTRIAXone (ROCEPHIN) 1 g in dextrose 5 % 50 mL IVPB  Status:  Discontinued     1 g 100 mL/hr over 30 Minutes Intravenous  Once 08/24/17 1949 08/24/17 1957   08/24/17 2000  azithromycin (ZITHROMAX) 500 mg in dextrose 5 % 250 mL IVPB  Status:  Discontinued     500 mg 250 mL/hr over 60 Minutes Intravenous  Once 08/24/17 1949 08/24/17 1957   08/24/17 2000  cefTRIAXone (ROCEPHIN) 1 g in dextrose 5 % 50 mL IVPB  Status:  Discontinued     1 g 100 mL/hr over 30 Minutes Intravenous Every 24 hours 08/24/17 1957 08/24/17 1958   08/24/17 2000  azithromycin (ZITHROMAX) 500 mg in dextrose 5 % 250 mL IVPB  Status:  Discontinued     500 mg 250 mL/hr over 60 Minutes Intravenous Every 24 hours 08/24/17 1957 08/26/17 1236   08/24/17 2000  cefTRIAXone (ROCEPHIN) 2 g in dextrose 5 % 50 mL IVPB  Status:  Discontinued     2 g 100 mL/hr over 30 Minutes Intravenous Every 24 hours 08/24/17 1958 08/26/17 1236       Subjective: Patient feeling well this morning. No CP, SOB.   Objective: Vitals:   08/26/17 0731 08/26/17 0800 08/26/17 1200 08/26/17 1229  BP: (!) 167/97 (!) 145/85 (!) 132/104 134/86  Pulse: 82 77 88 86  Resp: _0 Temp: 97.7 F (36.5 C)   98.7 F (37.1 C)  TempSrc: Oral   Oral  SpO2: 100% 100% 94% 91%  Weight:      Height:        Intake/Output Summary (Last 24 hours) at 08/26/2017 1240 Last data filed at 08/26/2017 1200 Gross per 24 hour  Intake 806 ml  Output 3625 ml  Net -2819 ml   Filed Weights   08/24/17 1912 08/25/17 1856  Weight: 115.7 kg (255 lb) 118.6 kg (261 lb 7.5 oz)    Examination:  General exam: Appears calm and comfortable  Respiratory system: Clear to auscultation. Respiratory effort normal. Cardiovascular system: S1 & S2 heard, RRR. No JVD, murmurs, rubs, gallops or clicks. No pedal edema. Gastrointestinal system: Abdomen is nondistended, soft and nontender. No organomegaly or masses felt. Normal bowel sounds  heard. Central nervous system: Alert and oriented. No focal neurological deficits. Extremities: Symmetric 5 x 5 power. Skin: No rashes, lesions or ulcers Psychiatry: Judgement and insight appear normal. Mood & affect appropriate.   Data Reviewed: I have personally reviewed following labs and imaging studies  CBC: Recent Labs  Lab 08/24/17 1924 08/25/17 0250 08/26/17 0758  WBC 11.8* 10.4 5.5  HGB 14.1 13.6 14.8  HCT 44.2 42.5 45.9  MCV 88.9 88.2 89.1  PLT 145* 150 953   Basic Metabolic Panel: Recent Labs  Lab 08/24/17 1924 08/25/17 0250 08/26/17 0758  NA 135 137 137  K 4.0 3.8 3.8  CL 99* 105 99*  CO2 _1 GLUCOSE 155* 91 91  BUN 21* 16 11  CREATININE 1.61* 1.19 1.04  CALCIUM 8.5* 8.1* 9.2   GFR: Estimated Creatinine Clearance: 79.9 mL/min (by C-G formula based on SCr of 1.04 mg/dL). Liver Function Tests: Recent Labs  Lab 08/24/17 1924  AST 41  ALT 27  ALKPHOS 62  BILITOT 1.1  PROT 6.2*  ALBUMIN 2.9*   No results for input(s): LIPASE, AMYLASE in the last 168 hours. No results for input(s): AMMONIA in the last 168 hours. Coagulation Profile: No results for input(s): INR, PROTIME in the last 168 hours. Cardiac Enzymes: Recent Labs  Lab 08/24/17 2005 08/25/17 0114 08/25/17 0250 08/25/17 0537  TROPONINI 0.40* 0.61* 0.56* 0.52*   BNP (last 3 results) No results for input(s): PROBNP in the last 8760 hours. HbA1C: No results for input(s): HGBA1C in the last 72 hours. CBG: No results for input(s): GLUCAP in the last 168 hours. Lipid Profile: No results for input(s): CHOL, HDL, LDLCALC, TRIG, CHOLHDL, LDLDIRECT in the last 72 hours. Thyroid Function Tests: No results for input(s): TSH, T4TOTAL, FREET4, T3FREE, THYROIDAB in the last 72 hours. Anemia Panel: No results for input(s): VITAMINB12, FOLATE, FERRITIN, TIBC, IRON, RETICCTPCT in the last 72 hours. Sepsis Labs: Recent Labs  Lab 08/24/17 2041 08/24/17 2229 08/25/17 0114  PROCALCITON  --    --  0.66  LATICACIDVEN 2.08* 2.13*  --     Recent Results (from the past 240 hour(s))  Blood Culture (routine x 2)     Status: None (Preliminary result)   Collection Time: 08/24/17  7:54 PM  Result Value Ref Range Status   Specimen Description BLOOD BLOOD LEFT FOREARM  Final   Special Requests   Final    BOTTLES DRAWN AEROBIC AND ANAEROBIC Blood Culture adequate volume   Culture NO GROWTH 2 DAYS  Final   Report Status PENDING  Incomplete  Blood Culture (routine x 2)     Status: None (Preliminary result)   Collection Time: 08/24/17  8:05 PM  Result Value Ref Range Status   Specimen Description BLOOD RIGHT ANTECUBITAL  Final   Special Requests   Final    BOTTLES DRAWN AEROBIC AND ANAEROBIC Blood Culture adequate volume   Culture NO GROWTH 2 DAYS  Final   Report Status PENDING  Incomplete  Respiratory Panel by PCR     Status: None   Collection Time: 08/24/17  8:31 PM  Result Value Ref Range Status   Adenovirus NOT DETECTED NOT DETECTED Final   Coronavirus 229E NOT DETECTED NOT DETECTED Final   Coronavirus HKU1 NOT DETECTED NOT DETECTED Final   Coronavirus NL63 NOT DETECTED NOT DETECTED Final   Coronavirus OC43 NOT DETECTED NOT DETECTED Final   Metapneumovirus NOT DETECTED NOT DETECTED Final   Rhinovirus / Enterovirus NOT DETECTED NOT DETECTED Final   Influenza A NOT DETECTED NOT DETECTED Final   Influenza B NOT DETECTED NOT DETECTED Final   Parainfluenza Virus 1 NOT DETECTED NOT DETECTED Final   Parainfluenza Virus 2 NOT DETECTED NOT DETECTED Final   Parainfluenza Virus 3 NOT DETECTED NOT DETECTED Final   Parainfluenza Virus 4 NOT DETECTED NOT DETECTED Final   Respiratory Syncytial Virus NOT DETECTED NOT DETECTED Final   Bordetella pertussis NOT DETECTED NOT DETECTED Final   Chlamydophila pneumoniae NOT DETECTED NOT DETECTED Final   Mycoplasma pneumoniae NOT DETECTED NOT DETECTED Final  MRSA PCR Screening     Status: None   Collection Time: 08/25/17  6:52 PM  Result Value Ref  Range Status   MRSA by PCR NEGATIVE  NEGATIVE Final    Comment:        The GeneXpert MRSA Assay (FDA approved for NASAL specimens only), is one component of a comprehensive MRSA colonization surveillance program. It is not intended to diagnose MRSA infection nor to guide or monitor treatment for MRSA infections.        Radiology Studies: Dg Chest 2 View  Result Date: 08/24/2017 CLINICAL DATA:  Hypoxia.  Syncope. EXAM: CHEST  2 VIEW COMPARISON:  01/09/2017 FINDINGS: The heart is enlarged. Unchanged mediastinal contours. Moderate pulmonary edema. Small pleural effusions. No confluent airspace disease. No pneumothorax. No acute osseous abnormalities. IMPRESSION: Mild to moderate CHF. Electronically Signed   By: Jeb Levering M.D.   On: 08/24/2017 21:00   Dg Thoracic Spine W/swimmers  Result Date: 08/25/2017 CLINICAL DATA:  Fall today with mid back pain. EXAM: THORACIC SPINE - 3 VIEWS COMPARISON:  Chest CT 4 hours prior. FINDINGS: The alignment is maintained. Vertebral body heights are maintained. No evidence of acute fracture. Mild disc space narrowing and endplate spurring as seen on CT. Posterior elements appear intact. There is no paravertebral soft tissue abnormality. IMPRESSION: No fracture of the thoracic spine. Electronically Signed   By: Jeb Levering M.D.   On: 08/25/2017 02:19   Ct Angio Chest Pe W/cm &/or Wo Cm  Result Date: 08/24/2017 CLINICAL DATA:  PE suspected, high pretest prob. Shortness of breath. Syncope leading to fall. EXAM: CT ANGIOGRAPHY CHEST WITH CONTRAST TECHNIQUE: Multidetector CT imaging of the chest was performed using the standard protocol during bolus administration of intravenous contrast. Multiplanar CT image reconstructions and MIPs were obtained to evaluate the vascular anatomy. CONTRAST:  159m ISOVUE-370 IOPAMIDOL (ISOVUE-370) INJECTION 76% COMPARISON:  Chest radiograph earlier this day.  Chest CT 09/15/2014 FINDINGS: Cardiovascular: Breathing motion  artifact obscures lower lobe evaluation. Prior bilateral pulmonary emboli have resolved. No residual or new filling defects in the pulmonary arteries. Prominent main pulmonary artery measuring 3.8 cm. Multi chamber cardiomegaly. There are coronary artery calcifications. Atherosclerosis of the thoracic aorta without evidence of dissection. Mediastinum/Nodes: Ovoid right infrahilar low-density measuring 19 x 12 mm likely an infrahilar lymph node, central pulmonary lesion is also considered. Additional shotty mediastinal nodes. Small to moderate hiatal hernia. No thyroid nodule. Lungs/Pleura: Emphysema. Breathing motion artifact obscures lower lobe evaluation. Mild upper lobe septal thickening suspicious for pulmonary edema. Ill-defined right upper lobe ground-glass opacity image 40 series 7 is new from prior exam. 9 x 6 mm left upper lobe pulmonary nodule image 67 series 7 is also new from prior exam. Small clustered nodules slightly more inferiorly in the left upper lobe. Patchy ground-glass opacities in the right lung base are obscured by motion. Minimal pleural fluid or thickening bilaterally, right greater than left. Upper Abdomen: No acute abnormality. Small to moderate hiatal hernia. Musculoskeletal: There are no acute or suspicious osseous abnormalities. Review of the MIP images confirms the above findings. IMPRESSION: 1. No acute pulmonary embolus. Previous bilateral pulmonary emboli have resolved, lower lobe subsegmental evaluation obscured by breathing motion. 2. Cardiomegaly with mild pulmonary edema. 3. Aortic atherosclerosis and coronary artery calcifications. 4. Left upper lobe pulmonary nodules, largest 9 x 6 mm (mean 8 mm). Focal ground-glass opacity in the left upper lobe. Enlarged right infrahilar nodes versus central pulmonary lesion. These findings are all nonspecific, new from exam 2 years prior. Initial follow-up with CT at 6-12 months is recommended to confirm persistence. 5. Emphysema. Aortic  Atherosclerosis (ICD10-I70.0) and Emphysema (ICD10-J43.9). Electronically Signed   By: MFonnie BirkenheadD.  On: 08/24/2017 22:20      Scheduled Meds: . allopurinol  100 mg Oral Daily  . aspirin EC  81 mg Oral Daily  . azithromycin  500 mg Oral Daily  . cefpodoxime  200 mg Oral Q12H  . doxazosin  4 mg Oral QHS  . furosemide  40 mg Intravenous BID  . heparin injection (subcutaneous)  5,000 Units Subcutaneous Q8H  . mouth rinse  15 mL Mouth Rinse BID  . pantoprazole  40 mg Oral Daily   Continuous Infusions:    LOS: 2 days    Time spent: 30 minutes   Dessa Phi, DO [facility] [email or web address] Password [email or web address] 08/26/2017, 12:40 PM

## 2017-08-26 NOTE — Evaluation (Signed)
Physical Therapy Evaluation Patient Details Name: Joshua [name] MRN: [id number] DOB: 05-23-1936 Today's Date: 08/26/2017   History of Present Illness  Pt admitted after syncopal episode with fall with sepsis due to PNA, acute respiratory failure with hypoxia due to CHF exacerbation, NSTEMI, back pain from fall. PMH: HTN, COPD, BPH (self caths), PE, TKA.  Clinical Impression  Pt admitted with above diagnosis. Pt currently with functional limitations due to the deficits listed below (see PT Problem List). Pt was able to ambulate on unit with min guard assist with a few LOB which pt self corrected.  Has 13 stairs at home therefore will need to practice at next visit.  Will follow acutely.   Pt will benefit from skilled PT to increase their independence and safety with mobility to allow discharge to the venue listed below.      Follow Up Recommendations No PT follow up;Supervision - Intermittent    Equipment Recommendations  None recommended by PT    Recommendations for Other Services       Precautions / Restrictions Precautions Precautions: Fall Precaution Comments: slightly unsteady Restrictions Weight Bearing Restrictions: No      Mobility  Bed Mobility Overal bed mobility: Modified Independent             General bed mobility comments: HOB up, no assist  Transfers Overall transfer level: Needs assistance Equipment used: None Transfers: Sit to/from Stand Sit to Stand: Supervision            Ambulation/Gait Ambulation/Gait assistance: Supervision;Min guard;+2 safety/equipment Ambulation Distance (Feet): 560 Feet Assistive device: None Gait Pattern/deviations: Step-through pattern;Decreased stride length;Drifts right/left;Staggering left   Gait velocity interpretation: Below normal speed for age/gender General Gait Details: Pt had a few small LOB but was able to self correct.  he said his left leg was stiff.  Gait got better as pt ambulated.    Stairs             Wheelchair Mobility    Modified Rankin (Stroke Patients Only)       Balance Overall balance assessment: Needs assistance Sitting-balance support: No upper extremity supported;Feet supported Sitting balance-Leahy Scale: Good     Standing balance support: No upper extremity supported;During functional activity Standing balance-Leahy Scale: Fair Standing balance comment: Pt was able to stand without UE support and wash hands.  Also cleaned himself after using bathroom.                              Pertinent Vitals/Pain Pain Assessment: No/denies pain   VSS with HR 103-126 bpm, 94% on RA, 150/84  Home Living Family/patient expects to be discharged to:: Private residence Living Arrangements: Spouse/significant other Available Help at Discharge: Family;Available 24 hours/day Type of Home: House Home Access: Stairs to enter Entrance Stairs-Rails: Right Entrance Stairs-Number of Steps: 4 Home Layout: Two level;Bed/bath upstairs;1/2 bath on main level Home Equipment: Cane - single point Additional Comments: wife works, school crossing guard,     Prior Function Level of Independence: Independent with assistive device(s);Independent         Comments: uses cane when he walks to the mailbox to protect himself from dogs     Hand Dominance   Dominant Hand: Right    Extremity/Trunk Assessment   Upper Extremity Assessment Upper Extremity Assessment: Defer to OT evaluation    Lower Extremity Assessment Lower Extremity Assessment: Generalized weakness    Cervical / Trunk Assessment Cervical / Trunk Assessment: Normal  Communication   Communication: No difficulties  Cognition Arousal/Alertness: Awake/alert Behavior During Therapy: WFL for tasks assessed/performed Overall Cognitive Status: Within Functional Limits for tasks assessed                                        General Comments      Exercises     Assessment/Plan     PT Assessment Patient needs continued PT services  PT Problem List Decreased strength;Decreased activity tolerance;Decreased balance;Decreased mobility;Decreased knowledge of use of DME;Decreased safety awareness;Decreased knowledge of precautions;Cardiopulmonary status limiting activity       PT Treatment Interventions DME instruction;Gait training;Functional mobility training;Therapeutic activities;Stair training;Therapeutic exercise;Balance training;Patient/family education    PT Goals (Current goals can be found in the Care Plan section)  Acute Rehab PT Goals Patient Stated Goal: to go home PT Goal Formulation: With patient Time For Goal Achievement: 09/09/17 Potential to Achieve Goals: Good    Frequency Min 3X/week   Barriers to discharge        Co-evaluation PT/OT/SLP Co-Evaluation/Treatment: Yes Reason for Co-Treatment: For patient/therapist safety;To address functional/ADL transfers PT goals addressed during session: Mobility/safety with mobility         AM-PAC PT "6 Clicks" Daily Activity  Outcome Measure Difficulty turning over in bed (including adjusting bedclothes, sheets and blankets)?: None Difficulty moving from lying on back to sitting on the side of the bed? : None Difficulty sitting down on and standing up from a chair with arms (e.g., wheelchair, bedside commode, etc,.)?: None Help needed moving to and from a bed to chair (including a wheelchair)?: None Help needed walking in hospital room?: A Little Help needed climbing 3-5 steps with a railing? : Total 6 Click Score: 20    End of Session Equipment Utilized During Treatment: Gait belt Activity Tolerance: Patient tolerated treatment well Patient left: in chair;with call bell/phone within reach;with family/visitor present Nurse Communication: Mobility status PT Visit Diagnosis: Unsteadiness on feet (R26.81);Muscle weakness (generalized) (M62.81)    Time: 6219-4712 PT Time Calculation (min) (ACUTE  ONLY): 17 min   Charges:   PT Evaluation $PT Eval Low Complexity: 1 Low     PT G Codes:        [place] White,PT Acute Rehabilitation [phone] (pager)   Denice Paradise 08/26/2017, 10:56 AM

## 2017-08-26 NOTE — Progress Notes (Signed)
[facility] for Heparin Indication: chest pain/ACS  Allergies  Allergen Reactions  . Other     Beer- Swelling   . Sunflower Oil Swelling  . Sulfa Antibiotics Rash    Patient Measurements: Height: 6' 3" (190.5 cm) Weight: 261 lb 7.5 oz (118.6 kg) IBW/kg (Calculated) : 84.5 Heparin Dosing Weight: 110 kg  Vital Signs: Temp: 97.7 F (36.5 C) (01/08 0731) Temp Source: Oral (01/08 0731) BP: 167/97 (01/08 0731) Pulse Rate: 82 (01/08 0731)  Labs: Recent Labs    08/24/17 1924  08/25/17 0114 08/25/17 0250 08/25/17 0537 08/25/17 1016 08/25/17 2105 08/26/17 0758  HGB 14.1  --   --  13.6  --   --   --  14.8  HCT 44.2  --   --  42.5  --   --   --  45.9  PLT 145*  --   --  150  --   --   --  160  HEPARINUNFRC  --   --   --   --   --  0.19* 0.42  --   CREATININE 1.61*  --   --  1.19  --   --   --   --   TROPONINI  --    < > 0.61* 0.56* 0.52*  --   --   --    < > = values in this interval not displayed.    Estimated Creatinine Clearance: 69.8 mL/min (by C-G formula based on SCr of 1.19 mg/dL).  Assessment: 82 y.o. male presented with syncope. He continues on IV heparin for r/o NSTEMI.   Heparin level this morning remains thearpeutic (HL 0.48 << 0.42, goal of 0.3-0.7). CBC wnl - no bleeding noted at this time  Goal of Therapy:  Heparin level 0.3-0.7 units/ml Monitor platelets by anticoagulation protocol: Yes   Plan:  1. Continue Heparin at 1700 units/hr 2. Will continue to monitor for any signs/symptoms of bleeding and will follow up with heparin level in the a.m.   Thank you for allowing pharmacy to be a part of this patient's care.  Alycia Rossetti, PharmD, BCPS Clinical Pharmacist Pager: [phone] Clinical phone for 08/26/2017 from 7a-3:30p: [phone] If after 3:30p, please call main pharmacy at: [phone] 08/26/2017 9:30 AM

## 2017-08-26 NOTE — Progress Notes (Signed)
Progress Note  Patient Name: Joshua [name] Date of Encounter: 08/26/2017  Primary Cardiologist: New  Subjective   Feeling well this morning. Worked with PT.  Inpatient Medications    Scheduled Meds: . allopurinol  100 mg Oral Daily  . aspirin EC  81 mg Oral Daily  . doxazosin  4 mg Oral QHS  . furosemide  40 mg Intravenous BID  . mouth rinse  15 mL Mouth Rinse BID  . pantoprazole  40 mg Oral Daily   Continuous Infusions: . azithromycin Stopped (08/25/17 2200)  . cefTRIAXone (ROCEPHIN)  IV Stopped (08/25/17 2052)  . heparin 1,700 Units/hr (08/26/17 0330)   PRN Meds: acetaminophen **OR** acetaminophen, ipratropium-albuterol, morphine injection, ondansetron **OR** ondansetron (ZOFRAN) IV, oxyCODONE-acetaminophen   Vital Signs    Vitals:   08/25/17 1926 08/25/17 2335 08/26/17 0335 08/26/17 0731  BP: (!) 152/87 (!) 149/94 (!) 143/82 (!) 167/97  Pulse:   81 82  Resp: _0 Temp: 98 F (36.7 C) 98.1 F (36.7 C) 97.9 F (36.6 C) 97.7 F (36.5 C)  TempSrc: Oral Oral Oral Oral  SpO2: 93% 93% 92% 100%  Weight:      Height:        Intake/Output Summary (Last 24 hours) at 08/26/2017 1058 Last data filed at 08/26/2017 0950 Gross per 24 hour  Intake 687 ml  Output 3275 ml  Net -2588 ml   Filed Weights   08/24/17 1912 08/25/17 1856  Weight: 255 lb (115.7 kg) 261 lb 7.5 oz (118.6 kg)    Telemetry    ST - Personally Reviewed  Physical Exam   General: Well developed, well nourished, male appearing in no acute distress. Head: Normocephalic, atraumatic.  Neck: Supple without bruits, JVD. Lungs:  Resp regular and unlabored, CTA. Heart: RRR, S1, S2, no S3, S4, or murmur; no rub. Abdomen: Soft, non-tender, non-distended with normoactive bowel sounds. No hepatomegaly. No rebound/guarding. No obvious abdominal masses. Extremities: No clubbing, cyanosis, edema. Distal pedal pulses are 2+ bilaterally. Neuro: Alert and oriented X 3. Moves all extremities  spontaneously. Psych: Normal affect.  Labs    Chemistry Recent Labs  Lab 08/24/17 1924 08/25/17 0250 08/26/17 0758  NA 135 137 137  K 4.0 3.8 3.8  CL 99* 105 99*  CO2 _1 GLUCOSE 155* 91 91  BUN 21* 16 11  CREATININE 1.61* 1.19 1.04  CALCIUM 8.5* 8.1* 9.2  PROT 6.2*  --   --   ALBUMIN 2.9*  --   --   AST 41  --   --   ALT 27  --   --   ALKPHOS 62  --   --   BILITOT 1.1  --   --   GFRNONAA 39* 56* >60  GFRAA 45* >60 >60  ANIONGAP _2 Hematology Recent Labs  Lab 08/24/17 1924 08/25/17 0250 08/26/17 0758  WBC 11.8* 10.4 5.5  RBC 4.97 4.82 5.15  HGB 14.1 13.6 14.8  HCT 44.2 42.5 45.9  MCV 88.9 88.2 89.1  MCH 28.4 28.2 28.7  MCHC 31.9 32.0 32.2  RDW 14.8 14.7 14.8  PLT 145* 150 160    Cardiac Enzymes Recent Labs  Lab 08/24/17 2005 08/25/17 0114 08/25/17 0250 08/25/17 0537  TROPONINI 0.40* 0.61* 0.56* 0.52*    Recent Labs  Lab 08/24/17 1942  TROPIPOC 0.57*     BNP Recent Labs  Lab 08/24/17 2210  BNP 1,253.8*     DDimer No results  for input(s): DDIMER in the last 168 hours.    Radiology    Dg Chest 2 View  Result Date: 08/24/2017 CLINICAL DATA:  Hypoxia.  Syncope. EXAM: CHEST  2 VIEW COMPARISON:  01/09/2017 FINDINGS: The heart is enlarged. Unchanged mediastinal contours. Moderate pulmonary edema. Small pleural effusions. No confluent airspace disease. No pneumothorax. No acute osseous abnormalities. IMPRESSION: Mild to moderate CHF. Electronically Signed   By: Jeb Levering M.D.   On: 08/24/2017 21:00   Dg Thoracic Spine W/swimmers  Result Date: 08/25/2017 CLINICAL DATA:  Fall today with mid back pain. EXAM: THORACIC SPINE - 3 VIEWS COMPARISON:  Chest CT 4 hours prior. FINDINGS: The alignment is maintained. Vertebral body heights are maintained. No evidence of acute fracture. Mild disc space narrowing and endplate spurring as seen on CT. Posterior elements appear intact. There is no paravertebral soft tissue abnormality.  IMPRESSION: No fracture of the thoracic spine. Electronically Signed   By: Jeb Levering M.D.   On: 08/25/2017 02:19   Ct Angio Chest Pe W/cm &/or Wo Cm  Result Date: 08/24/2017 CLINICAL DATA:  PE suspected, high pretest prob. Shortness of breath. Syncope leading to fall. EXAM: CT ANGIOGRAPHY CHEST WITH CONTRAST TECHNIQUE: Multidetector CT imaging of the chest was performed using the standard protocol during bolus administration of intravenous contrast. Multiplanar CT image reconstructions and MIPs were obtained to evaluate the vascular anatomy. CONTRAST:  143m ISOVUE-370 IOPAMIDOL (ISOVUE-370) INJECTION 76% COMPARISON:  Chest radiograph earlier this day.  Chest CT 09/15/2014 FINDINGS: Cardiovascular: Breathing motion artifact obscures lower lobe evaluation. Prior bilateral pulmonary emboli have resolved. No residual or new filling defects in the pulmonary arteries. Prominent main pulmonary artery measuring 3.8 cm. Multi chamber cardiomegaly. There are coronary artery calcifications. Atherosclerosis of the thoracic aorta without evidence of dissection. Mediastinum/Nodes: Ovoid right infrahilar low-density measuring 19 x 12 mm likely an infrahilar lymph node, central pulmonary lesion is also considered. Additional shotty mediastinal nodes. Small to moderate hiatal hernia. No thyroid nodule. Lungs/Pleura: Emphysema. Breathing motion artifact obscures lower lobe evaluation. Mild upper lobe septal thickening suspicious for pulmonary edema. Ill-defined right upper lobe ground-glass opacity image 40 series 7 is new from prior exam. 9 x 6 mm left upper lobe pulmonary nodule image 67 series 7 is also new from prior exam. Small clustered nodules slightly more inferiorly in the left upper lobe. Patchy ground-glass opacities in the right lung base are obscured by motion. Minimal pleural fluid or thickening bilaterally, right greater than left. Upper Abdomen: No acute abnormality. Small to moderate hiatal hernia.  Musculoskeletal: There are no acute or suspicious osseous abnormalities. Review of the MIP images confirms the above findings. IMPRESSION: 1. No acute pulmonary embolus. Previous bilateral pulmonary emboli have resolved, lower lobe subsegmental evaluation obscured by breathing motion. 2. Cardiomegaly with mild pulmonary edema. 3. Aortic atherosclerosis and coronary artery calcifications. 4. Left upper lobe pulmonary nodules, largest 9 x 6 mm (mean 8 mm). Focal ground-glass opacity in the left upper lobe. Enlarged right infrahilar nodes versus central pulmonary lesion. These findings are all nonspecific, new from exam 2 years prior. Initial follow-up with CT at 6-12 months is recommended to confirm persistence. 5. Emphysema. Aortic Atherosclerosis (ICD10-I70.0) and Emphysema (ICD10-J43.9). Electronically Signed   By: MJeb LeveringM.D.   On: 08/24/2017 22:20    Cardiac Studies   TTE: 08/25/17  Study Conclusions  - Left ventricle: The cavity size was normal. There was severe   focal basal hypertrophy. Systolic function was normal. The   estimated ejection fraction  was in the range of 55% to 60%. Wall   motion was normal; there were no regional wall motion   abnormalities. There was an increased relative contribution of   atrial contraction to ventricular filling. Doppler parameters are   consistent with abnormal left ventricular relaxation (grade 1   diastolic dysfunction). - Aortic valve: There was mild to moderate regurgitation.   Regurgitation pressure half-time: 424 ms. - Aortic root: The aortic root was mildly dilated. - Right ventricle: The cavity size was severely dilated. Wall   thickness was normal. Systolic function was severely reduced. - Right atrium: The atrium was moderately dilated. - Pulmonic valve: There was moderate regurgitation. - Pulmonary arteries: PA peak pressure: 73 mm Hg (S).  Impressions:  - The right ventricular systolic pressure was increased consistent    with severe pulmonary hypertension.  Patient Profile     82 y.o. male with PMH of COPD, VTE (formerly on NOAC, course completed), and HTN presenting with syncope. Found to have an elevated troponin.   Assessment & Plan    1. Elevated Troponin: Low flat trend in the setting of sepsis. No ACS trend. No chest pain. Echo showed normal EF. Can plan for outpatient work up.   2. Acute respiratory failure 2/2 PNA?/CHF: Significantly improved. >4L UOP yesterday. Would continue with IV lasix today, and plan to transition to oral in the am.   3. HTN: Stable with current therapy  4. Sepsis: Was treated with antibiotics per primary. Labs now improved. BC negative.  Signed, Reino Bellis, NP  08/26/2017, 10:58 AM  Pager # [phone]   For questions or updates, please contact [place] Please consult [email or web address] for contact info under Cardiology/STEMI.  I have personally seen and examined this patient with Reino Bellis, NP.  I agree with the assessment and plan as outlined above. He has diuresed well. He still has elevated JVP although his ankle edema has resolved and his lungs are clear. I agree with one more day of IV Lasix. I would suspect that he would be able to be transitioned to oral Lasix tomorrow and likely be discharged home tomorrow. Troponin with slight elevation and flat trend. Will plan outpatient ischemic evaluation. He has had no chest pain.l   Lauree Chandler 08/26/2017 12:09 PM

## 2017-08-27 DIAGNOSIS — J181 Lobar pneumonia, unspecified organism: Secondary | ICD-10-CM

## 2017-08-27 LAB — CBC
HCT: 45.1 % (ref 39.0–52.0)
Hemoglobin: 14.3 g/dL (ref 13.0–17.0)
MCH: 27.9 pg (ref 26.0–34.0)
MCHC: 31.7 g/dL (ref 30.0–36.0)
MCV: 87.9 fL (ref 78.0–100.0)
Platelets: 174 10*3/uL (ref 150–400)
RBC: 5.13 MIL/uL (ref 4.22–5.81)
RDW: 14.4 % (ref 11.5–15.5)
WBC: 4.7 10*3/uL (ref 4.0–10.5)

## 2017-08-27 LAB — BASIC METABOLIC PANEL
Anion gap: 7 (ref 5–15)
BUN: 9 mg/dL (ref 6–20)
CO2: 30 mmol/L (ref 22–32)
Calcium: 9.3 mg/dL (ref 8.9–10.3)
Chloride: 102 mmol/L (ref 101–111)
Creatinine, Ser: 0.97 mg/dL (ref 0.61–1.24)
GFR calc Af Amer: >60 mL/min (ref >60)
GFR calc non Af Amer: >60 mL/min (ref >60)
Glucose, Bld: 90 mg/dL (ref 65–99)
Potassium: 3.6 mmol/L (ref 3.5–5.1)
Sodium: 139 mmol/L (ref 135–145)

## 2017-08-27 MED ORDER — CEFPODOXIME PROXETIL 200 MG PO TABS: 200.0000 mg | ORAL_TABLET | Freq: Two times a day (BID) | ORAL | 0 refills | Status: DC

## 2017-08-27 MED ORDER — FUROSEMIDE 40 MG PO TABS: 40.0000 mg | ORAL_TABLET | Freq: Two times a day (BID) | ORAL | Status: DC

## 2017-08-27 MED ORDER — AZITHROMYCIN 500 MG PO TABS: 500.0000 mg | ORAL_TABLET | Freq: Every day | ORAL | 0 refills | Status: DC

## 2017-08-27 NOTE — Progress Notes (Signed)
Patient discharged home with wife after reviewing discharge instructions and remving saline locks. Patient verberalized understanding when to take antibiotics.

## 2017-08-27 NOTE — Progress Notes (Signed)
Physical Therapy Discharge Patient Details Name: Joshua [name] MRN: [id number] DOB: October 06, 1935 Today's Date: 08/27/2017 Time: 3128-1188 PT Time Calculation (min) (ACUTE ONLY): 18 min  Patient discharged from PT services secondary to goals met and no further PT needs identified.  Please see latest therapy progress note for current level of functioning and progress toward goals.    Progress and discharge plan discussed with patient and/or caregiver: Patient/Caregiver agrees with plan  GP     Denice Paradise 08/27/2017, 3:11 PM   [facility] Acute Rehabilitation [phone] (pager)

## 2017-08-27 NOTE — Progress Notes (Signed)
Physical Therapy Treatment and D/C  Patient Details Name: Joshua [name] MRN: [id number] DOB: [birth year] Today's Date: 08/27/2017    History of Present Illness Pt admitted after syncopal episode with fall with sepsis due to PNA, acute respiratory failure with hypoxia due to CHF exacerbation, NSTEMI, back pain from fall. PMH: HTN, COPD, BPH (self caths), PE, TKA.    PT Comments    Pt admitted with above diagnosis. Pt currently without significant functional limitations and is functioning at his baseline.  Pt met all goals set and was able to ascend and descend steps without physical assist.  Pt no longer needs skilled PT.  Pt agrees.  Will sign off.     Follow Up Recommendations  No PT follow up;Supervision - Intermittent     Equipment Recommendations  None recommended by PT    Recommendations for Other Services       Precautions / Restrictions Precautions Precautions: Fall Restrictions Weight Bearing Restrictions: No    Mobility  Bed Mobility                  Transfers Overall transfer level: Needs assistance Equipment used: None Transfers: Sit to/from Stand Sit to Stand: Supervision            Ambulation/Gait Ambulation/Gait assistance: Supervision Ambulation Distance (Feet): 500 Feet Assistive device: None Gait Pattern/deviations: Step-through pattern;Decreased stride length;Drifts right/left;Staggering left   Gait velocity interpretation: at or above normal speed for age/gender General Gait Details: No LOB with min challenges.  Pt states he is back to baseline.    Stairs Stairs: Yes   Stair Management: One rail Right;Alternating pattern;Step to pattern;Forwards Number of Stairs: 10 General stair comments: Pt ascends steps with alternating feet and descends steps with step to pattern due to previous knee surgery  Wheelchair Mobility    Modified Rankin (Stroke Patients Only)       Balance Overall balance assessment: Needs  assistance Sitting-balance support: No upper extremity supported;Feet supported Sitting balance-Leahy Scale: Good     Standing balance support: No upper extremity supported;During functional activity Standing balance-Leahy Scale: Fair Standing balance comment: Pt was able to stand without UE support and wash hands.  Also cleaned himself after using bathroom.                             Cognition Arousal/Alertness: Awake/alert Behavior During Therapy: WFL for tasks assessed/performed Overall Cognitive Status: Within Functional Limits for tasks assessed                                        Exercises      General Comments        Pertinent Vitals/Pain Pain Assessment: No/denies pain  VSS  Home Living                      Prior Function            PT Goals (current goals can now be found in the care plan section) Acute Rehab PT Goals Patient Stated Goal: to go home Progress towards PT goals: Goals met/education completed, patient discharged from PT    Frequency    Min 3X/week      PT Plan Current plan remains appropriate    Co-evaluation              AM-PAC PT "6 Clicks"  Daily Activity  Outcome Measure  Difficulty turning over in bed (including adjusting bedclothes, sheets and blankets)?: None Difficulty moving from lying on back to sitting on the side of the bed? : None Difficulty sitting down on and standing up from a chair with arms (e.g., wheelchair, bedside commode, etc,.)?: None Help needed moving to and from a bed to chair (including a wheelchair)?: None Help needed walking in hospital room?: None Help needed climbing 3-5 steps with a railing? : None 6 Click Score: 24    End of Session Equipment Utilized During Treatment: Gait belt Activity Tolerance: Patient tolerated treatment well Patient left: in chair;with call bell/phone within reach Nurse Communication: Mobility status PT Visit Diagnosis:  Unsteadiness on feet (R26.81);Muscle weakness (generalized) (M62.81)     Time: 0174-9449 PT Time Calculation (min) (ACUTE ONLY): 18 min  Charges:  $Gait Training: 8-22 mins                    G Codes:       Dawn White,PT Acute Rehabilitation [phone] (pager)    Denice Paradise 08/27/2017, 3:08 PM

## 2017-08-27 NOTE — Progress Notes (Signed)
SATURATION QUALIFICATIONS: (This note is used to comply with regulatory documentation for home oxygen)  Patient Saturations on Room Air at Rest = 84%  Patient Saturations on Room Air while Ambulating = 82%  Patient Saturations on 2 Liters of oxygen while Ambulating = 88%  Please briefly explain why patient needs home oxygen: Patient's sats decreased to 82% while ambulating on room air.

## 2017-08-27 NOTE — Progress Notes (Signed)
Progress Note  Patient Name: Joshua [name] Date of Encounter: 08/27/2017  Primary Cardiologist: Burt Knack  Subjective   No complaints today.  Inpatient Medications    Scheduled Meds: . allopurinol  100 mg Oral Daily  . aspirin EC  81 mg Oral Daily  . azithromycin  500 mg Oral Daily  . cefpodoxime  200 mg Oral Q12H  . doxazosin  4 mg Oral QHS  . furosemide  40 mg Intravenous BID  . heparin injection (subcutaneous)  5,000 Units Subcutaneous Q8H  . mouth rinse  15 mL Mouth Rinse BID  . pantoprazole  40 mg Oral Daily   Continuous Infusions:  PRN Meds: acetaminophen **OR** acetaminophen, ipratropium-albuterol, morphine injection, ondansetron **OR** ondansetron (ZOFRAN) IV, oxyCODONE-acetaminophen   Vital Signs    Vitals:   08/26/17 2123 08/26/17 2300 08/27/17 0700 08/27/17 1100  BP: (!) 146/96 (!) 145/91 133/79   Pulse:  80 75   Resp:  18 14   Temp:  98.1 F (36.7 C) 98.3 F (36.8 C) 98.8 F (37.1 C)  TempSrc:  Oral Oral Oral  SpO2:  96% 96%   Weight:      Height:        Intake/Output Summary (Last 24 hours) at 08/27/2017 1222 Last data filed at 08/27/2017 1124 Gross per 24 hour  Intake 240 ml  Output 2200 ml  Net -1960 ml   Filed Weights   08/24/17 1912 08/25/17 1856 08/26/17 2023  Weight: 255 lb (115.7 kg) 261 lb 7.5 oz (118.6 kg) 262 lb 2 oz (118.9 kg)    Telemetry    SR with short runs of NSVT - Personally Reviewed  Physical Exam   General: Well developed, well nourished, male appearing in no acute distress. Head: Normocephalic, atraumatic.  Neck: Supple without bruits, JVD. Lungs:  Resp regular and unlabored, CTA. Heart: RRR, S1, S2, no S3, S4, or murmur; no rub. Abdomen: Soft, non-tender, non-distended with normoactive bowel sounds.  Extremities: No clubbing, cyanosis, edema. Distal pedal pulses are 2+ bilaterally. Neuro: Alert and oriented X 3. Moves all extremities spontaneously. Psych: Normal affect.  Labs    Chemistry Recent Labs  Lab  08/24/17 1924 08/25/17 0250 08/26/17 0758 08/27/17 0432  NA 135 137 137 139  K 4.0 3.8 3.8 3.6  CL 99* 105 99* 102  CO2 _0 GLUCOSE 155* 91 91 90  BUN 21* _1 CREATININE 1.61* 1.19 1.04 0.97  CALCIUM 8.5* 8.1* 9.2 9.3  PROT 6.2*  --   --   --   ALBUMIN 2.9*  --   --   --   AST 41  --   --   --   ALT 27  --   --   --   ALKPHOS 62  --   --   --   BILITOT 1.1  --   --   --   GFRNONAA 39* 56* >60 >60  GFRAA 45* >60 >60 >60  ANIONGAP _2 Hematology Recent Labs  Lab 08/25/17 0250 08/26/17 0758 08/27/17 0432  WBC 10.4 5.5 4.7  RBC 4.82 5.15 5.13  HGB 13.6 14.8 14.3  HCT 42.5 45.9 45.1  MCV 88.2 89.1 87.9  MCH 28.2 28.7 27.9  MCHC 32.0 32.2 31.7  RDW 14.7 14.8 14.4  PLT 150 160 174    Cardiac Enzymes Recent Labs  Lab 08/24/17 2005 08/25/17 0114 08/25/17 0250 08/25/17 0537  TROPONINI 0.40* 0.61* 0.56* 0.52*  Recent Labs  Lab 08/24/17 1942  TROPIPOC 0.57*     BNP Recent Labs  Lab 08/24/17 2210  BNP 1,253.8*     DDimer No results for input(s): DDIMER in the last 168 hours.    Radiology    No results found.  Cardiac Studies   TTE: 08/25/17  Study Conclusions  - Left ventricle: The cavity size was normal. There was severe focal basal hypertrophy. Systolic function was normal. The estimated ejection fraction was in the range of 55% to 60%. Wall motion was normal; there were no regional wall motion abnormalities. There was an increased relative contribution of atrial contraction to ventricular filling. Doppler parameters are consistent with abnormal left ventricular relaxation (grade 1 diastolic dysfunction). - Aortic valve: There was mild to moderate regurgitation. Regurgitation pressure half-time: 424 ms. - Aortic root: The aortic root was mildly dilated. - Right ventricle: The cavity size was severely dilated. Wall thickness was normal. Systolic function was severely reduced. - Right atrium: The  atrium was moderately dilated. - Pulmonic valve: There was moderate regurgitation. - Pulmonary arteries: PA peak pressure: 73 mm Hg (S).  Impressions:  - The right ventricular systolic pressure was increased consistent with severe pulmonary hypertension.  Patient Profile     82 y.o. male with PMH of COPD, VTE (formerly on NOAC, course completed), and HTN presenting with syncope. Found to have an elevated troponin.  Assessment & Plan    1. Elevated Troponin: Low flat trend in the setting of sepsis. No ACS trend. No chest pain. Echo showed normal EF. Plan for outpatient stress testing. Will arrange at the time of discharge.   2. Acute respiratory failure 2/2 PNA?/CHF: Significantly improved. >2.5L UOP yesterday. Will switch to oral lasix today.   3. HTN: Stable with current therapy  4. Sepsis: Was treated with antibiotics per primary. Labs now improved. BC negative.  Signed, Reino Bellis, NP  08/27/2017, 12:22 PM  Pager # [phone]   For questions or updates, please contact [facility] Please consult [email or web address] for contact info under Cardiology/STEMI.   pt discharged prior to my evaluation this evening.   Sherren Mocha 08/27/2017 11:25 PM

## 2017-08-27 NOTE — Care Management Note (Signed)
Case Management Note  Patient Details  Name: Joshua [name] MRN: [id number] Date of Birth: 1935-11-28  Subjective/Objective:  From home, with  PMH COPD, HTN presented with SOB  followed by syncope. Found to be febrile with hypoxia, admitted for sepsis, pneumonia, syncope. Seen by cardiology for elevated trops.  Plan to dc home today with home oxygen, patient states he would like to work with [facility] for  Home oxygen, referral given to [place].  They will bring to patient's room before discharge.                   Action/Plan: NCM will follow for dc needs.  Expected Discharge Date:  08/27/17               Expected Discharge Plan:  Home/Self Care  In-House Referral:     Discharge planning Services  CM Consult  Post Acute Care Choice:  Durable Medical Equipment Choice offered to:  Patient  DME Arranged:  Oxygen DME Agency:  [place]:    [facility] Agency:     Status of Service:  Completed, signed off  If discussed at [place] of Stay Meetings, dates discussed:    Additional Comments:  Zenon Mayo, RN 08/27/2017, 4:41 PM

## 2017-08-27 NOTE — Discharge Summary (Signed)
Physician Discharge Summary  Joshua [name]:[id number] DOB: 01/14/1936 DOA: 08/24/2017  PCP: Jani Gravel, MD  Admit date: 08/24/2017 Discharge date: 08/27/2017  Recommendations for Outpatient Follow-up:  1. Acute hypoxic resp failure secondary to pneumonia, started on oxygen. 2. Outpt ischemic evaluation recommended by cardiology. 3. Incidental pulmonary nodule -Left upper lobe pulmonary nodules, largest 9 x 6 mm (mean 8 mm). Focal ground-glass opacity in the left upper lobe. Enlarged right infrahilar nodes versus central pulmonary lesion. These findings are all nonspecific, new from exam 2 years prior. Initial follow-up with CT at 6-12 months is recommended to confirm persistence.   Follow-up Information    Jani Gravel, MD. Schedule an appointment as soon as possible for a visit in 1 week(s).   Specialty:  Internal Medicine Contact information: [place] [id number] [phone]        Sherren Mocha, MD. Schedule an appointment as soon as possible for a visit in 2 week(s).   Specialty:  Cardiology Contact information: [address] [place] [address] [place] [facility].   Why:  home oxygen, Contact information: [address] [place] [address] [phone]            Discharge Diagnoses:  1. Sepsis secondary to lobar (LUL) pneumonia with associated acute hypoxic resp failure 2. Elevated troponin 3. Acute on chronic diastolic CHF 4. Syncope 5. AKI 6. Back pain s/p fall. X-ray negative.  7. COPD 8. Incidental pulmonary nodule 9. Aortic atherosclerosis   Discharge Condition: improved Disposition: home with oxygen  Diet recommendation: heart healthy  Filed Weights   08/24/17 1912 08/25/17 1856 08/26/17 2023  Weight: 115.7 kg (255 lb) 118.6 kg (261 lb 7.5 oz) 118.9 kg (262 lb 2 oz)    History of present illness:  61yom PMH COPD, HTN presented with SOB  walking upstairs followed by syncope. Found to be febrile with hypoxia, admitted for sepsis, pneumonia, syncope. Seen by cardiology.   Hospital Course:  Patient rapidly improved with IV abx for pneumonia and was transitioned to oral abx. He responded well to diuretic for CHF. He was seen by cardiology for elevated troponin and outpatient workup is planned. He was hypoxic on room air and so home oxygen was arranged. Patient has been on home oxygen before. Individual issues as below.  Sepsis secondary to lobar (LUL) pneumonia with associated acute hypoxic resp failure. CT chest no PE. - afebrile >48 hours, vitals stable - still hypoxic, home on oxygen  Elevated troponin in the setting of sepsis. ACS ruled out. Echo showed normal EF. Outpt ischemic evaluation recommended by cardiology.  Acute on chronic diastolic CHF - appears compensated. UOP 2575, -2.4 L since admission  Syncope secondary to acute illness. Echo unremarkable. No further evaluation recommended.  AKI - resolved  Back pain s/p fall. X-ray negative.   COPD - stable  Incidental pulmonary nodule -Left upper lobe pulmonary nodules, largest 9 x 6 mm (mean 8 mm). Focal ground-glass opacity in the left upper lobe. Enlarged right infrahilar nodes versus central pulmonary lesion. These findings are all nonspecific, new from exam 2 years prior. Initial follow-up with CT at 6-12 months is recommended to confirm persistence.  Aortic atherosclerosis   Consultants:  Cardiology   Procedures:  Echo Study Conclusions  - Left ventricle: The cavity size was normal. There was severe focal basal hypertrophy. Systolic function was normal. The estimated ejection fraction was  in the range of 55% to 60%. Wall motion was normal; there were no regional wall motion abnormalities. There was an increased relative contribution of atrial contraction to ventricular filling. Doppler parameters are consistent with abnormal  left ventricular relaxation (grade 1 diastolic dysfunction). - Aortic valve: There was mild to moderate regurgitation. Regurgitation pressure half-time: 424 ms. - Aortic root: The aortic root was mildly dilated. - Right ventricle: The cavity size was severely dilated. Wall thickness was normal. Systolic function was severely reduced. - Right atrium: The atrium was moderately dilated. - Pulmonic valve: There was moderate regurgitation. - Pulmonary arteries: PA peak pressure: 73 mm Hg (S).  Impressions:  - The right ventricular systolic pressure was increased consistent with severe pulmonary hypertension.    Antimicrobials:  Ceftriaxone 1/6-1/7  cefpodoxime 1/8-1/9  ceftin 1/10  azithromycin 1/6 >> 1/10  Today's assessment: S: feels good, breathing well. O: Vitals:  Vitals:   08/27/17 1532 08/27/17 1700  BP:  134/84  Pulse: 80 89  Resp: 15 17  Temp:    SpO2: 91% 97%    Constitutional:  . Appears calm and comfortable Respiratory:  . CTA bilaterally, no w/r/r.  . Respiratory effort normal.  Cardiovascular:  . RRR, no m/r/g . No LE extremity edema   Psychiatric:  . Mental status o Mood, affect appropriate  CBC and BMP unremarkable  Discharge Instructions  Discharge Instructions    Diet - low sodium heart healthy   Complete by:  As directed    Discharge instructions   Complete by:  As directed    Call your physician or seek immediate medical attention for passing out, shortness of breath, chest pain, swelling or worsening of condition.   Increase activity slowly   Complete by:  As directed      Allergies as of 08/27/2017      Reactions   Other    Beer- Swelling    Sunflower Oil Swelling   Sulfa Antibiotics Rash      Medication List    TAKE these medications   albuterol 108 (90 Base) MCG/ACT inhaler Commonly known as:  PROVENTIL HFA;VENTOLIN HFA Inhale 1-2 puffs into the lungs every 6 (six) hours as needed for wheezing or shortness of  breath.   allopurinol 100 MG tablet Commonly known as:  ZYLOPRIM Take 100 mg by mouth daily.   aspirin 81 MG tablet Take 81 mg by mouth daily.   azithromycin 500 MG tablet Commonly known as:  ZITHROMAX Take 1 tablet (500 mg total) by mouth daily.   cefpodoxime 200 MG tablet Commonly known as:  VANTIN Take 1 tablet (200 mg total) by mouth every 12 (twelve) hours.   Cinnamon 500 MG capsule Take 500 mg by mouth daily.   doxazosin 4 MG tablet Commonly known as:  CARDURA Take 4 mg by mouth at bedtime. Patient takes in the pm   furosemide 20 MG tablet Commonly known as:  LASIX Take 20-40 mg by mouth daily. Take 20 mg everyday on Monday, Wednesday and Friday take 40 mg   pantoprazole 40 MG tablet Commonly known as:  PROTONIX Take 40 mg by mouth daily.   ramipril 2.5 MG capsule Commonly known as:  ALTACE Take 2.5 mg by mouth daily.            Durable Medical Equipment  (From admission, onward)        Start     Ordered   08/27/17 1632  For home use only DME oxygen  Once  Question Answer Comment  Mode or (Route) Nasal cannula   Liters per Minute 2   Frequency Continuous (stationary and portable oxygen unit needed)   Oxygen delivery system Gas      08/27/17 1631     Allergies  Allergen Reactions  . Other     Beer- Swelling   . Sunflower Oil Swelling  . Sulfa Antibiotics Rash    The results of significant diagnostics from this hospitalization (including imaging, microbiology, ancillary and laboratory) are listed below for reference.    Significant Diagnostic Studies: Dg Chest 2 View  Result Date: 08/24/2017 CLINICAL DATA:  Hypoxia.  Syncope. EXAM: CHEST  2 VIEW COMPARISON:  01/09/2017 FINDINGS: The heart is enlarged. Unchanged mediastinal contours. Moderate pulmonary edema. Small pleural effusions. No confluent airspace disease. No pneumothorax. No acute osseous abnormalities. IMPRESSION: Mild to moderate CHF. Electronically Signed   By: Jeb Levering  M.D.   On: 08/24/2017 21:00   Dg Thoracic Spine W/swimmers  Result Date: 08/25/2017 CLINICAL DATA:  Fall today with mid back pain. EXAM: THORACIC SPINE - 3 VIEWS COMPARISON:  Chest CT 4 hours prior. FINDINGS: The alignment is maintained. Vertebral body heights are maintained. No evidence of acute fracture. Mild disc space narrowing and endplate spurring as seen on CT. Posterior elements appear intact. There is no paravertebral soft tissue abnormality. IMPRESSION: No fracture of the thoracic spine. Electronically Signed   By: Jeb Levering M.D.   On: 08/25/2017 02:19   Ct Angio Chest Pe W/cm &/or Wo Cm  Result Date: 08/24/2017 CLINICAL DATA:  PE suspected, high pretest prob. Shortness of breath. Syncope leading to fall. EXAM: CT ANGIOGRAPHY CHEST WITH CONTRAST TECHNIQUE: Multidetector CT imaging of the chest was performed using the standard protocol during bolus administration of intravenous contrast. Multiplanar CT image reconstructions and MIPs were obtained to evaluate the vascular anatomy. CONTRAST:  140m ISOVUE-370 IOPAMIDOL (ISOVUE-370) INJECTION 76% COMPARISON:  Chest radiograph earlier this day.  Chest CT 09/15/2014 FINDINGS: Cardiovascular: Breathing motion artifact obscures lower lobe evaluation. Prior bilateral pulmonary emboli have resolved. No residual or new filling defects in the pulmonary arteries. Prominent main pulmonary artery measuring 3.8 cm. Multi chamber cardiomegaly. There are coronary artery calcifications. Atherosclerosis of the thoracic aorta without evidence of dissection. Mediastinum/Nodes: Ovoid right infrahilar low-density measuring 19 x 12 mm likely an infrahilar lymph node, central pulmonary lesion is also considered. Additional shotty mediastinal nodes. Small to moderate hiatal hernia. No thyroid nodule. Lungs/Pleura: Emphysema. Breathing motion artifact obscures lower lobe evaluation. Mild upper lobe septal thickening suspicious for pulmonary edema. Ill-defined right upper  lobe ground-glass opacity image 40 series 7 is new from prior exam. 9 x 6 mm left upper lobe pulmonary nodule image 67 series 7 is also new from prior exam. Small clustered nodules slightly more inferiorly in the left upper lobe. Patchy ground-glass opacities in the right lung base are obscured by motion. Minimal pleural fluid or thickening bilaterally, right greater than left. Upper Abdomen: No acute abnormality. Small to moderate hiatal hernia. Musculoskeletal: There are no acute or suspicious osseous abnormalities. Review of the MIP images confirms the above findings. IMPRESSION: 1. No acute pulmonary embolus. Previous bilateral pulmonary emboli have resolved, lower lobe subsegmental evaluation obscured by breathing motion. 2. Cardiomegaly with mild pulmonary edema. 3. Aortic atherosclerosis and coronary artery calcifications. 4. Left upper lobe pulmonary nodules, largest 9 x 6 mm (mean 8 mm). Focal ground-glass opacity in the left upper lobe. Enlarged right infrahilar nodes versus central pulmonary lesion. These findings are all nonspecific,  new from exam 2 years prior. Initial follow-up with CT at 6-12 months is recommended to confirm persistence. 5. Emphysema. Aortic Atherosclerosis (ICD10-I70.0) and Emphysema (ICD10-J43.9). Electronically Signed   By: Jeb Levering M.D.   On: 08/24/2017 22:20    Microbiology: Recent Results (from the past 240 hour(s))  Blood Culture (routine x 2)     Status: None (Preliminary result)   Collection Time: 08/24/17  7:54 PM  Result Value Ref Range Status   Specimen Description BLOOD BLOOD LEFT FOREARM  Final   Special Requests   Final    BOTTLES DRAWN AEROBIC AND ANAEROBIC Blood Culture adequate volume   Culture NO GROWTH 3 DAYS  Final   Report Status PENDING  Incomplete  Blood Culture (routine x 2)     Status: None (Preliminary result)   Collection Time: 08/24/17  8:05 PM  Result Value Ref Range Status   Specimen Description BLOOD RIGHT ANTECUBITAL  Final    Special Requests   Final    BOTTLES DRAWN AEROBIC AND ANAEROBIC Blood Culture adequate volume   Culture NO GROWTH 3 DAYS  Final   Report Status PENDING  Incomplete  Respiratory Panel by PCR     Status: None   Collection Time: 08/24/17  8:31 PM  Result Value Ref Range Status   Adenovirus NOT DETECTED NOT DETECTED Final   Coronavirus 229E NOT DETECTED NOT DETECTED Final   Coronavirus HKU1 NOT DETECTED NOT DETECTED Final   Coronavirus NL63 NOT DETECTED NOT DETECTED Final   Coronavirus OC43 NOT DETECTED NOT DETECTED Final   Metapneumovirus NOT DETECTED NOT DETECTED Final   Rhinovirus / Enterovirus NOT DETECTED NOT DETECTED Final   Influenza A NOT DETECTED NOT DETECTED Final   Influenza B NOT DETECTED NOT DETECTED Final   Parainfluenza Virus 1 NOT DETECTED NOT DETECTED Final   Parainfluenza Virus 2 NOT DETECTED NOT DETECTED Final   Parainfluenza Virus 3 NOT DETECTED NOT DETECTED Final   Parainfluenza Virus 4 NOT DETECTED NOT DETECTED Final   Respiratory Syncytial Virus NOT DETECTED NOT DETECTED Final   Bordetella pertussis NOT DETECTED NOT DETECTED Final   Chlamydophila pneumoniae NOT DETECTED NOT DETECTED Final   Mycoplasma pneumoniae NOT DETECTED NOT DETECTED Final  MRSA PCR Screening     Status: None   Collection Time: 08/25/17  6:52 PM  Result Value Ref Range Status   MRSA by PCR NEGATIVE NEGATIVE Final    Comment:        The GeneXpert MRSA Assay (FDA approved for NASAL specimens only), is one component of a comprehensive MRSA colonization surveillance program. It is not intended to diagnose MRSA infection nor to guide or monitor treatment for MRSA infections.      Labs: Basic Metabolic Panel: Recent Labs  Lab 08/24/17 1924 08/25/17 0250 08/26/17 0758 08/27/17 0432  NA 135 137 137 139  K 4.0 3.8 3.8 3.6  CL 99* 105 99* 102  CO2 _0 GLUCOSE 155* 91 91 90  BUN 21* _1 CREATININE 1.61* 1.19 1.04 0.97  CALCIUM 8.5* 8.1* 9.2 9.3   Liver Function  Tests: Recent Labs  Lab 08/24/17 1924  AST 41  ALT 27  ALKPHOS 62  BILITOT 1.1  PROT 6.2*  ALBUMIN 2.9*   CBC: Recent Labs  Lab 08/24/17 1924 08/25/17 0250 08/26/17 0758 08/27/17 0432  WBC 11.8* 10.4 5.5 4.7  HGB 14.1 13.6 14.8 14.3  HCT 44.2 42.5 45.9 45.1  MCV 88.9 88.2 89.1 87.9  PLT 145*  150 160 174   Cardiac Enzymes: Recent Labs  Lab 08/24/17 2005 08/25/17 0114 08/25/17 0250 08/25/17 0537  TROPONINI 0.40* 0.61* 0.56* 0.52*    Recent Labs    08/24/17 2210  BNP 1,253.8*    Active Problems:   COPD (chronic obstructive pulmonary disease) (HCC)   Essential hypertension   Sepsis (HCC)   BPH with urinary obstruction   Syncope, vasovagal   Demand ischemia of myocardium (HCC)   Acute diastolic (congestive) heart failure ([place])   Time coordinating discharge: 40 minutes  Signed:  Murray Hodgkins, MD [facility] 08/27/2017, 8:18 PM

## 2017-08-29 LAB — CULTURE, BLOOD (ROUTINE X 2)
Culture: NO GROWTH
Culture: NO GROWTH
Special Requests: ADEQUATE
Special Requests: ADEQUATE

## 2017-09-02 ENCOUNTER — Encounter: Payer: Self-pay | Admitting: Physician Assistant

## 2017-09-15 NOTE — Progress Notes (Signed)
Cardiology Office Note    Date:  09/18/2017   ID:  Joshua [name], DOB [birth year], MRN [id number]  PCP:  Jani Gravel, MD  Cardiologist:  Dr. Burt Knack  Chief Complaint: Hospital follow up   History of Present Illness:   Joshua [name] is a 82 y.o. male COPD, VTE (formerly on [place], course completed), and HTN Presents for hospital follow up.   Admitted 1/6-1/9 for for acute respiratory failure 2/2 PNA?/CHF. Treated with diuretics.  Found to have an elevated troponin. Low flat trend in the setting of sepsis. No ACS trend. No chest pain. Echo showed normal EF. Plan for outpatient stress testing,   Here today for follow up.  He is feeling better.  Does not checks his weight every day.  He eats food high in salt including but not limited to multiple pretzels a day,  Bacon, chips and canned foods.  He is inquiring oxygen 24/7, now reduced to 1 L.  Has not follow-up with PCP yet.  Compliant with medication.  Denies chest pain, shortness of breath, palpitation, orthopnea, PND or dizziness.  He has lower extremity edema.  Past Medical History:  Diagnosis Date  . Arthritis   . BPH (benign prostatic hyperplasia)   . COPD (chronic obstructive pulmonary disease) ([place])   . Folliculitis    posterior scalp per office visit note of Dr Maudie Mercury 07/20/2014    . GERD (gastroesophageal reflux disease)   . Hypertension   . PE (pulmonary thromboembolism) ([place])   . Phlebitis    right arm  at least 20 years ago   . Pneumonia    hx of pneumonia as a child     Past Surgical History:  Procedure Laterality Date  . bone removed from little toe right foot     . CHOLECYSTECTOMY    . pilonidal cyst removal     . TOTAL KNEE ARTHROPLASTY Left 09/13/2014   Procedure: LEFT TOTAL KNEE ARTHROPLASTY;  Surgeon: Mauri Pole, MD;  Location: [facility];  Service: Orthopedics;  Laterality: Left;    Current Medications: Prior to Admission medications   Medication Sig Start Date End Date Taking? Authorizing Provider    albuterol (PROVENTIL HFA;VENTOLIN HFA) 108 (90 Base) MCG/ACT inhaler Inhale 1-2 puffs into the lungs every 6 (six) hours as needed for wheezing or shortness of breath. 11/11/16   Magdalen Spatz, NP  allopurinol (ZYLOPRIM) 100 MG tablet Take 100 mg by mouth daily.    [provider]  aspirin 81 MG tablet Take 81 mg by mouth daily.    [provider]  azithromycin (ZITHROMAX) 500 MG tablet Take 1 tablet (500 mg total) by mouth daily. 08/27/17   Samuella Cota, MD  cefpodoxime (VANTIN) 200 MG tablet Take 1 tablet (200 mg total) by mouth every 12 (twelve) hours. 08/27/17   Samuella Cota, MD  Cinnamon 500 MG capsule Take 500 mg by mouth daily.    [provider]  doxazosin (CARDURA) 4 MG tablet Take 4 mg by mouth at bedtime. Patient takes in the pm    [provider]  furosemide (LASIX) 20 MG tablet Take 20-40 mg by mouth daily. Take 20 mg everyday on Monday, Wednesday and Friday take 40 mg    [provider]  pantoprazole (PROTONIX) 40 MG tablet Take 40 mg by mouth daily.    [provider]  ramipril (ALTACE) 2.5 MG capsule Take 2.5 mg by mouth daily.    [provider]    Allergies:  Other; Sunflower oil; and Sulfa antibiotics   Social History   Socioeconomic History  . Marital status: Married    Spouse name: None  . Number of children: None  . Years of education: None  . Highest education level: None  Social Needs  . Financial resource strain: None  . Food insecurity - worry: None  . Food insecurity - inability: None  . Transportation needs - medical: None  . Transportation needs - non-medical: None  Occupational History  . None  Tobacco Use  . Smoking status: Former Smoker    Packs/day: 1.00    Years: 50.00    Pack years: 50.00    Types: Cigarettes    Last attempt to quit: 08/19/2006    Years since quitting: 11.0  . Smokeless tobacco: Never Used  Substance and Sexual Activity  . Alcohol use: Yes     Alcohol/week: 3.6 oz    Types: 6 Shots of liquor per week  . Drug use: No  . Sexual activity: None  Other Topics Concern  . None  Social History Narrative  . None     Family History:  The patient's family history includes Heart attack (age of onset: 67) in his brother.   ROS:   Please see the history of present illness.    ROS All other systems reviewed and are negative.   PHYSICAL EXAM:   VS:  BP 130/64   Pulse 92   Ht _0  (1.905 m)   Wt 260 lb (117.9 kg)   SpO2 94%   BMI 32.50 kg/m    GEN: Well nourished, well developed, in no acute distress  HEENT: normal  Neck: no JVD, carotid bruits, or masses Cardiac: RRR; no murmurs, rubs, or gallops, Trace BL LE edema  Respiratory: Diminished breath sounds throughout without rales or crackles.  On 1 L oxygen. GI: soft, nontender, nondistended, + BS MS: no deformity or atrophy  Skin: warm and dry, no rash Neuro:  Alert and Oriented x 3, Strength and sensation are intact Psych: euthymic mood, full affect  Wt Readings from Last 3 Encounters:  09/18/17 260 lb (117.9 kg)  08/26/17 262 lb 2 oz (118.9 kg)  02/03/17 256 lb (116.1 kg)      Studies/Labs Reviewed:   EKG:  EKG is not ordered today.   Recent Labs: 08/24/2017: ALT 27; B Natriuretic Peptide 1,253.8 08/27/2017: BUN 9; Creatinine, Ser 0.97; Hemoglobin 14.3; Platelets 174; Potassium 3.6; Sodium 139   Lipid Panel No results found for: CHOL, TRIG, HDL, CHOLHDL, VLDL, LDLCALC, LDLDIRECT  Additional studies/ records that were reviewed today include:   TTE: 08/25/17  Study Conclusions  - Left ventricle: The cavity size was normal. There was severe focal basal hypertrophy. Systolic function was normal. The estimated ejection fraction was in the range of 55% to 60%. Wall motion was normal; there were no regional wall motion abnormalities. There was an increased relative contribution of atrial contraction to ventricular filling. Doppler parameters  are consistent with abnormal left ventricular relaxation (grade 1 diastolic dysfunction). - Aortic valve: There was mild to moderate regurgitation. Regurgitation pressure half-time: 424 ms. - Aortic root: The aortic root was mildly dilated. - Right ventricle: The cavity size was severely dilated. Wall thickness was normal. Systolic function was severely reduced. - Right atrium: The atrium was moderately dilated. - Pulmonic valve: There was moderate regurgitation. - Pulmonary arteries: PA peak pressure: 73 mm Hg (S).  Impressions:  - The right ventricular systolic pressure was increased consistent with severe  pulmonary hypertension.  CTA of chest  08/24/17 IMPRESSION: 1. No acute pulmonary embolus. Previous bilateral pulmonary emboli have resolved, lower lobe subsegmental evaluation obscured by breathing motion. 2. Cardiomegaly with mild pulmonary edema. 3. Aortic atherosclerosis and coronary artery calcifications. 4. Left upper lobe pulmonary nodules, largest 9 x 6 mm (mean 8 mm). Focal ground-glass opacity in the left upper lobe. Enlarged right infrahilar nodes versus central pulmonary lesion. These findings are all nonspecific, new from exam 2 years prior. Initial follow-up with CT at 6-12 months is recommended to confirm persistence. 5. Emphysema.   ASSESSMENT & PLAN:    1. Recent Elevated troponin - In setting of sepsis. Echo with normal LVEF. Will get stress testing as recomeneded by Dr. Burt Knack.   2. HTN - Stable and Well controlled on current medications.   3.  Acute on chronic diastolic heart failure -Edema noted on [place].  He is feeling better since discharge.  Advised to cut back on the salt.  Education given for more than 15 minutes.  Daughter and wife were present during discussion.  Continue current dose of Lasix.  Will check bmet today.  4.  COPD/Emphysema with oxygen dependent -He is requiring oxygen 24/7 since discharge.  Per patient and family,  unable to get appointment with PCP for many months.  He will call pulmonologist for appointment.  Lungs without wheezing.  5. Pulmonary nodules - Noted on recent CTA of chest. F/u with PCP/pulmonologist.   Medication Adjustments/Labs and Tests Ordered: Current medicines are reviewed at length with the patient today.  Concerns regarding medicines are outlined above.  Medication changes, Labs and Tests ordered today are listed in the Patient Instructions below. Patient Instructions  Medication Instructions:  1. Your physician recommends that you continue on your current medications as directed. Please refer to the Current Medication list given to you today.   Labwork: TODAY BMET  Testing/Procedures: 1. Your physician has requested that you have a lexiscan myoview. For further information please visit HugeFiesta.[email or web address]. Please follow instruction sheet, as given.    Follow-Up: DR. Burt Knack IN 3-4 MONTHS  If you need a refill on your cardiac medications before your next appointment, please call your pharmacy.  Any Other Special Instructions Will Be Listed Below (If Applicable). 1. MONITOR WEIGHT DAILY; CALL IF WEIGHT IS UP 3 LB'S OR MORE IN 1 DAY OR MORE THAN 5 LB'S IN 1 WEEK; [phone]; HERE ARE SOME GUIDELINES FOR HEART FAILURE, SEE BELOW.   Heart Failure Heart failure means your heart has trouble pumping blood. This makes it hard for your body to work well. Heart failure is usually a long-term (chronic) condition. You must take good care of yourself and follow your doctor's treatment plan. Follow these instructions at home:  Take your heart medicine as told by your doctor. ? Do not stop taking medicine unless your doctor tells you to. ? Do not skip any dose of medicine. ? Refill your medicines before they run out. ? Take other medicines only as told by your doctor or pharmacist.  Stay active if told by your doctor. The elderly and people with severe heart failure should talk  with a doctor about physical activity.  Eat heart-healthy foods. Choose foods that are without trans fat and are low in saturated fat, cholesterol, and salt (sodium). This includes fresh or frozen fruits and vegetables, fish, lean meats, fat-free or low-fat dairy foods, whole grains, and high-fiber foods. Lentils and dried peas and beans (legumes) are also good choices.  Limit salt if  told by your doctor.  Cook in a healthy way. Roast, grill, broil, bake, poach, steam, or stir-fry foods.  Limit fluids as told by your doctor.  Weigh yourself every morning. Do this after you pee (urinate) and before you eat breakfast. Write down your weight to give to your doctor.  Take your blood pressure and write it down if your doctor tells you to.  Ask your doctor how to check your pulse. Check your pulse as told.  Lose weight if told by your doctor.  Stop smoking or chewing tobacco. Do not use gum or patches that help you quit without your doctor's approval.  Schedule and go to doctor visits as told.  Nonpregnant women should have no more than 1 drink a day. Men should have no more than 2 drinks a day. Talk to your doctor about drinking alcohol.  Stop illegal drug use.  Stay current with shots (immunizations).  Manage your health conditions as told by your doctor.  Learn to manage your stress.  Rest when you are tired.  If it is really hot outside: ? Avoid intense activities. ? Use air conditioning or fans, or get in a cooler place. ? Avoid caffeine and alcohol. ? Wear loose-fitting, lightweight, and light-colored clothing.  If it is really cold outside: ? Avoid intense activities. ? Layer your clothing. ? Wear mittens or gloves, a hat, and a scarf when going outside. ? Avoid alcohol.  Learn about heart failure and get support as needed.  Get help to maintain or improve your quality of life and your ability to care for yourself as needed. Contact a doctor if:  You gain weight  quickly.  You are more short of breath than usual.  You cannot do your normal activities.  You tire easily.  You cough more than normal, especially with activity.  You have any or more puffiness (swelling) in areas such as your hands, feet, ankles, or belly (abdomen).  You cannot sleep because it is hard to breathe.  You feel like your heart is beating fast (palpitations).  You get dizzy or light-headed when you stand up. Get help right away if:  You have trouble breathing.  There is a change in mental status, such as becoming less alert or not being able to focus.  You have chest pain or discomfort.  You faint. This information is not intended to replace advice given to you by your health care provider. Make sure you discuss any questions you have with your health care provider. Document Released: 05/14/2008 Document Revised: 01/11/2016 Document Reviewed: 09/21/2012 Elsevier Interactive Patient Education  [address] DASH DIET AS SEEN BELOW   DASH Eating Plan DASH stands for "Dietary Approaches to Stop Hypertension." The DASH eating plan is a healthy eating plan that has been shown to reduce high blood pressure (hypertension). It may also reduce your risk for type 2 diabetes, heart disease, and stroke. The DASH eating plan may also help with weight loss. What are tips for following this plan? General guidelines  Avoid eating more than 2,300 mg (milligrams) of salt (sodium) a day. If you have hypertension, you may need to reduce your sodium intake to 1,500 mg a day.  Limit alcohol intake to no more than 1 drink a day for nonpregnant women and 2 drinks a day for men. One drink equals 12 oz of beer, 5 oz of wine, or 1 oz of hard liquor.  Work with your health care provider to  maintain a healthy body weight or to lose weight. Ask what an ideal weight is for you.  Get at least 30 minutes of exercise that causes your heart to beat faster (aerobic exercise)  most days of the week. Activities may include walking, swimming, or biking.  Work with your health care provider or diet and nutrition specialist (dietitian) to adjust your eating plan to your individual calorie needs. Reading food labels  Check food labels for the amount of sodium per serving. Choose foods with less than 5 percent of the Daily Value of sodium. Generally, foods with less than 300 mg of sodium per serving fit into this eating plan.  To find whole grains, look for the word "whole" as the first word in the ingredient list. Shopping  Buy products labeled as "low-sodium" or "no salt added."  Buy fresh foods. Avoid canned foods and premade or frozen meals. Cooking  Avoid adding salt when cooking. Use salt-free seasonings or herbs instead of table salt or sea salt. Check with your health care provider or pharmacist before using salt substitutes.  Do not fry foods. Cook foods using healthy methods such as baking, boiling, grilling, and broiling instead.  Cook with heart-healthy oils, such as olive, canola, soybean, or sunflower oil. Meal planning   Eat a balanced diet that includes: ? 5 or more servings of fruits and vegetables each day. At each meal, try to fill half of your plate with fruits and vegetables. ? Up to 6-8 servings of whole grains each day. ? Less than 6 oz of lean meat, poultry, or fish each day. A 3-oz serving of meat is about the same size as a deck of cards. One egg equals 1 oz. ? 2 servings of low-fat dairy each day. ? A serving of nuts, seeds, or beans 5 times each week. ? Heart-healthy fats. Healthy fats called Omega-3 fatty acids are found in foods such as flaxseeds and coldwater fish, like sardines, salmon, and mackerel.  Limit how much you eat of the following: ? Canned or prepackaged foods. ? Food that is high in trans fat, such as fried foods. ? Food that is high in saturated fat, such as fatty meat. ? Sweets, desserts, sugary drinks, and other  foods with added sugar. ? Full-fat dairy products.  Do not salt foods before eating.  Try to eat at least 2 vegetarian meals each week.  Eat more home-cooked food and less restaurant, buffet, and fast food.  When eating at a restaurant, ask that your food be prepared with less salt or no salt, if possible. What foods are recommended? The items listed may not be a complete list. Talk with your dietitian about what dietary choices are best for you. Grains Whole-grain or whole-wheat bread. Whole-grain or whole-wheat pasta. Brown rice. [place]. Bulgur. Whole-grain and low-sodium cereals. Pita bread. Low-fat, low-sodium crackers. Whole-wheat flour tortillas. Vegetables Fresh or frozen vegetables (raw, steamed, roasted, or grilled). Low-sodium or reduced-sodium tomato and vegetable juice. Low-sodium or reduced-sodium tomato sauce and tomato paste. Low-sodium or reduced-sodium canned vegetables. Fruits All fresh, dried, or frozen fruit. Canned fruit in natural juice (without added sugar). Meat and other protein foods Skinless chicken or [place]. Ground chicken or [place]. Pork with fat trimmed off. Fish and seafood. Egg whites. Dried beans, peas, or lentils. Unsalted nuts, nut butters, and seeds. Unsalted canned beans. Lean cuts of beef with fat trimmed off. Low-sodium, lean deli meat. Dairy Low-fat (1%) or fat-free (skim) milk. Fat-free, low-fat, or reduced-fat cheeses. Nonfat,  low-sodium ricotta or cottage cheese. Low-fat or nonfat yogurt. Low-fat, low-sodium cheese. Fats and oils Soft margarine without trans fats. Vegetable oil. Low-fat, reduced-fat, or light mayonnaise and salad dressings (reduced-sodium). Canola, safflower, olive, soybean, and sunflower oils. Avocado. Seasoning and other foods Herbs. Spices. Seasoning mixes without salt. Unsalted popcorn and pretzels. Fat-free sweets. What foods are not recommended? The items listed may not be a complete list. Talk with your dietitian  about what dietary choices are best for you. Grains Baked goods made with fat, such as croissants, muffins, or some breads. Dry pasta or rice meal packs. Vegetables Creamed or fried vegetables. Vegetables in a cheese sauce. Regular canned vegetables (not low-sodium or reduced-sodium). Regular canned tomato sauce and paste (not low-sodium or reduced-sodium). Regular tomato and vegetable juice (not low-sodium or reduced-sodium). Angie Fava. Olives. Fruits Canned fruit in a light or heavy syrup. Fried fruit. Fruit in cream or butter sauce. Meat and other protein foods Fatty cuts of meat. Ribs. Fried meat. Berniece Salines. Sausage. Bologna and other processed lunch meats. Salami. Fatback. Hotdogs. Bratwurst. Salted nuts and seeds. Canned beans with added salt. Canned or smoked fish. Whole eggs or egg yolks. Chicken or [place] with skin. Dairy Whole or 2% milk, cream, and half-and-half. Whole or full-fat cream cheese. Whole-fat or sweetened yogurt. Full-fat cheese. Nondairy creamers. Whipped toppings. Processed cheese and cheese spreads. Fats and oils Butter. Stick margarine. Lard. Shortening. Ghee. Bacon fat. Tropical oils, such as coconut, palm kernel, or palm oil. Seasoning and other foods Salted popcorn and pretzels. Onion salt, garlic salt, seasoned salt, table salt, and sea salt. Worcestershire sauce. Tartar sauce. Barbecue sauce. Teriyaki sauce. Soy sauce, including reduced-sodium. Steak sauce. Canned and packaged gravies. Fish sauce. Oyster sauce. Cocktail sauce. Horseradish that you find on the shelf. Ketchup. Mustard. Meat flavorings and tenderizers. Bouillon cubes. Hot sauce and Tabasco sauce. Premade or packaged marinades. Premade or packaged taco seasonings. Relishes. Regular salad dressings. Where to find more information:  National Heart, Lung, and [place]: https://wilson-eaton.com/  American Heart Association: www.heart.org Summary  The DASH eating plan is a healthy eating plan that has been shown  to reduce high blood pressure (hypertension). It may also reduce your risk for type 2 diabetes, heart disease, and stroke.  With the DASH eating plan, you should limit salt (sodium) intake to 2,300 mg a day. If you have hypertension, you may need to reduce your sodium intake to 1,500 mg a day.  When on the DASH eating plan, aim to eat more fresh fruits and vegetables, whole grains, lean proteins, low-fat dairy, and heart-healthy fats.  Work with your health care provider or diet and nutrition specialist (dietitian) to adjust your eating plan to your individual calorie needs. This information is not intended to replace advice given to you by your health care provider. Make sure you discuss any questions you have with your health care provider. Document Released: 07/25/2011 Document Revised: 07/29/2016 Document Reviewed: 07/29/2016 Elsevier Interactive Patient Education  2018 [place], [place], [place]  09/18/2017 10:37 AM    [facility] [place], [place], [place]  [address] Phone: [phone]; Fax: [phone]

## 2017-09-18 ENCOUNTER — Ambulatory Visit (INDEPENDENT_AMBULATORY_CARE_PROVIDER_SITE_OTHER): Payer: Medicare Other | Admitting: Physician Assistant

## 2017-09-18 ENCOUNTER — Encounter: Payer: Self-pay | Admitting: Physician Assistant

## 2017-09-18 VITALS — BP 130/64 | HR 92 | Ht 75.0 in | Wt 260.0 lb

## 2017-09-18 DIAGNOSIS — R7989 Other specified abnormal findings of blood chemistry: Secondary | ICD-10-CM

## 2017-09-18 DIAGNOSIS — R748 Abnormal levels of other serum enzymes: Secondary | ICD-10-CM | POA: Diagnosis not present

## 2017-09-18 DIAGNOSIS — I10 Essential (primary) hypertension: Secondary | ICD-10-CM | POA: Diagnosis not present

## 2017-09-18 DIAGNOSIS — J438 Other emphysema: Secondary | ICD-10-CM

## 2017-09-18 DIAGNOSIS — I5031 Acute diastolic (congestive) heart failure: Secondary | ICD-10-CM | POA: Diagnosis not present

## 2017-09-18 DIAGNOSIS — R778 Other specified abnormalities of plasma proteins: Secondary | ICD-10-CM

## 2017-09-18 LAB — BASIC METABOLIC PANEL
BUN/Creatinine Ratio: 16 (ref 10–24)
BUN: 17 mg/dL (ref 8–27)
CO2: 27 mmol/L (ref 20–29)
Calcium: 10 mg/dL (ref 8.6–10.2)
Chloride: 99 mmol/L (ref 96–106)
Creatinine, Ser: 1.07 mg/dL (ref 0.76–1.27)
GFR calc Af Amer: 76 mL/min/{1.73_m2} (ref >59)
GFR calc non Af Amer: 66 mL/min/{1.73_m2} (ref >59)
Glucose: 100 mg/dL — ABNORMAL HIGH (ref 65–99)
Potassium: 4.5 mmol/L (ref 3.5–5.2)
Sodium: 139 mmol/L (ref 134–144)

## 2017-09-18 NOTE — Patient Instructions (Signed)
Medication Instructions:  1. Your physician recommends that you continue on your current medications as directed. Please refer to the Current Medication list given to you today.   Labwork: TODAY BMET  Testing/Procedures: 1. Your physician has requested that you have a lexiscan myoview. For further information please visit [email or web address]. Please follow instruction sheet, as given.    Follow-Up: DR. Burt Knack IN 3-4 MONTHS  If you need a refill on your cardiac medications before your next appointment, please call your pharmacy.  Any Other Special Instructions Will Be Listed Below (If Applicable). 1. MONITOR WEIGHT DAILY; CALL IF WEIGHT IS UP 3 LB'S OR MORE IN 1 DAY OR MORE THAN 5 LB'S IN 1 WEEK; [phone]; HERE ARE SOME GUIDELINES FOR HEART FAILURE, SEE BELOW.   Heart Failure Heart failure means your heart has trouble pumping blood. This makes it hard for your body to work well. Heart failure is usually a long-term (chronic) condition. You must take good care of yourself and follow your doctor's treatment plan. Follow these instructions at home:  Take your heart medicine as told by your doctor. ? Do not stop taking medicine unless your doctor tells you to. ? Do not skip any dose of medicine. ? Refill your medicines before they run out. ? Take other medicines only as told by your doctor or pharmacist.  Stay active if told by your doctor. The elderly and people with severe heart failure should talk with a doctor about physical activity.  Eat heart-healthy foods. Choose foods that are without trans fat and are low in saturated fat, cholesterol, and salt (sodium). This includes fresh or frozen fruits and vegetables, fish, lean meats, fat-free or low-fat dairy foods, whole grains, and high-fiber foods. Lentils and dried peas and beans (legumes) are also good choices.  Limit salt if told by your doctor.  Cook in a healthy way. Roast, grill, broil, bake, poach, steam, or stir-fry  foods.  Limit fluids as told by your doctor.  Weigh yourself every morning. Do this after you pee (urinate) and before you eat breakfast. Write down your weight to give to your doctor.  Take your blood pressure and write it down if your doctor tells you to.  Ask your doctor how to check your pulse. Check your pulse as told.  Lose weight if told by your doctor.  Stop smoking or chewing tobacco. Do not use gum or patches that help you quit without your doctor's approval.  Schedule and go to doctor visits as told.  Nonpregnant women should have no more than 1 drink a day. Men should have no more than 2 drinks a day. Talk to your doctor about drinking alcohol.  Stop illegal drug use.  Stay current with shots (immunizations).  Manage your health conditions as told by your doctor.  Learn to manage your stress.  Rest when you are tired.  If it is really hot outside: ? Avoid intense activities. ? Use air conditioning or fans, or get in a cooler place. ? Avoid caffeine and alcohol. ? Wear loose-fitting, lightweight, and light-colored clothing.  If it is really cold outside: ? Avoid intense activities. ? Layer your clothing. ? Wear mittens or gloves, a hat, and a scarf when going outside. ? Avoid alcohol.  Learn about heart failure and get support as needed.  Get help to maintain or improve your quality of life and your ability to care for yourself as needed. Contact a doctor if:  You gain weight quickly.  You are more short  of breath than usual.  You cannot do your normal activities.  You tire easily.  You cough more than normal, especially with activity.  You have any or more puffiness (swelling) in areas such as your hands, feet, ankles, or belly (abdomen).  You cannot sleep because it is hard to breathe.  You feel like your heart is beating fast (palpitations).  You get dizzy or light-headed when you stand up. Get help right away if:  You have trouble  breathing.  There is a change in mental status, such as becoming less alert or not being able to focus.  You have chest pain or discomfort.  You faint. This information is not intended to replace advice given to you by your health care provider. Make sure you discuss any questions you have with your health care provider. Document Released: 05/14/2008 Document Revised: 01/11/2016 Document Reviewed: 09/21/2012 Elsevier Interactive Patient Education  2017 [place] DASH DIET AS SEEN BELOW   DASH Eating Plan DASH stands for "Dietary Approaches to Stop Hypertension." The DASH eating plan is a healthy eating plan that has been shown to reduce high blood pressure (hypertension). It may also reduce your risk for type 2 diabetes, heart disease, and stroke. The DASH eating plan may also help with weight loss. What are tips for following this plan? General guidelines  Avoid eating more than 2,300 mg (milligrams) of salt (sodium) a day. If you have hypertension, you may need to reduce your sodium intake to 1,500 mg a day.  Limit alcohol intake to no more than 1 drink a day for nonpregnant women and 2 drinks a day for men. One drink equals 12 oz of beer, 5 oz of wine, or 1 oz of hard liquor.  Work with your health care provider to maintain a healthy body weight or to lose weight. Ask what an ideal weight is for you.  Get at least 30 minutes of exercise that causes your heart to beat faster (aerobic exercise) most days of the week. Activities may include walking, swimming, or biking.  Work with your health care provider or diet and nutrition specialist (dietitian) to adjust your eating plan to your individual calorie needs. Reading food labels  Check food labels for the amount of sodium per serving. Choose foods with less than 5 percent of the Daily Value of sodium. Generally, foods with less than 300 mg of sodium per serving fit into this eating plan.  To find whole grains, look  for the word "whole" as the first word in the ingredient list. Shopping  Buy products labeled as "low-sodium" or "no salt added."  Buy fresh foods. Avoid canned foods and premade or frozen meals. Cooking  Avoid adding salt when cooking. Use salt-free seasonings or herbs instead of table salt or sea salt. Check with your health care provider or pharmacist before using salt substitutes.  Do not fry foods. Cook foods using healthy methods such as baking, boiling, grilling, and broiling instead.  Cook with heart-healthy oils, such as olive, canola, soybean, or sunflower oil. Meal planning   Eat a balanced diet that includes: ? 5 or more servings of fruits and vegetables each day. At each meal, try to fill half of your plate with fruits and vegetables. ? Up to 6-8 servings of whole grains each day. ? Less than 6 oz of lean meat, poultry, or fish each day. A 3-oz serving of meat is about the same size as a deck of cards. One  egg equals 1 oz. ? 2 servings of low-fat dairy each day. ? A serving of nuts, seeds, or beans 5 times each week. ? Heart-healthy fats. Healthy fats called Omega-3 fatty acids are found in foods such as flaxseeds and coldwater fish, like sardines, salmon, and mackerel.  Limit how much you eat of the following: ? Canned or prepackaged foods. ? Food that is high in trans fat, such as fried foods. ? Food that is high in saturated fat, such as fatty meat. ? Sweets, desserts, sugary drinks, and other foods with added sugar. ? Full-fat dairy products.  Do not salt foods before eating.  Try to eat at least 2 vegetarian meals each week.  Eat more home-cooked food and less restaurant, buffet, and fast food.  When eating at a restaurant, ask that your food be prepared with less salt or no salt, if possible. What foods are recommended? The items listed may not be a complete list. Talk with your dietitian about what dietary choices are best for you. Grains Whole-grain or  whole-wheat bread. Whole-grain or whole-wheat pasta. Brown rice. [place]. Bulgur. Whole-grain and low-sodium cereals. Pita bread. Low-fat, low-sodium crackers. Whole-wheat flour tortillas. Vegetables Fresh or frozen vegetables (raw, steamed, roasted, or grilled). Low-sodium or reduced-sodium tomato and vegetable juice. Low-sodium or reduced-sodium tomato sauce and tomato paste. Low-sodium or reduced-sodium canned vegetables. Fruits All fresh, dried, or frozen fruit. Canned fruit in natural juice (without added sugar). Meat and other protein foods Skinless chicken or [place]. Ground chicken or [place]. Pork with fat trimmed off. Fish and seafood. Egg whites. Dried beans, peas, or lentils. Unsalted nuts, nut butters, and seeds. Unsalted canned beans. Lean cuts of beef with fat trimmed off. Low-sodium, lean deli meat. Dairy Low-fat (1%) or fat-free (skim) milk. Fat-free, low-fat, or reduced-fat cheeses. Nonfat, low-sodium ricotta or cottage cheese. Low-fat or nonfat yogurt. Low-fat, low-sodium cheese. Fats and oils Soft margarine without trans fats. Vegetable oil. Low-fat, reduced-fat, or light mayonnaise and salad dressings (reduced-sodium). Canola, safflower, olive, soybean, and sunflower oils. Avocado. Seasoning and other foods Herbs. Spices. Seasoning mixes without salt. Unsalted popcorn and pretzels. Fat-free sweets. What foods are not recommended? The items listed may not be a complete list. Talk with your dietitian about what dietary choices are best for you. Grains Baked goods made with fat, such as croissants, muffins, or some breads. Dry pasta or rice meal packs. Vegetables Creamed or fried vegetables. Vegetables in a cheese sauce. Regular canned vegetables (not low-sodium or reduced-sodium). Regular canned tomato sauce and paste (not low-sodium or reduced-sodium). Regular tomato and vegetable juice (not low-sodium or reduced-sodium). Angie Fava. Olives. Fruits Canned fruit in a light  or heavy syrup. Fried fruit. Fruit in cream or butter sauce. Meat and other protein foods Fatty cuts of meat. Ribs. Fried meat. Berniece Salines. Sausage. Bologna and other processed lunch meats. Salami. Fatback. Hotdogs. Bratwurst. Salted nuts and seeds. Canned beans with added salt. Canned or smoked fish. Whole eggs or egg yolks. Chicken or [place] with skin. Dairy Whole or 2% milk, cream, and half-and-half. Whole or full-fat cream cheese. Whole-fat or sweetened yogurt. Full-fat cheese. Nondairy creamers. Whipped toppings. Processed cheese and cheese spreads. Fats and oils Butter. Stick margarine. Lard. Shortening. Ghee. Bacon fat. Tropical oils, such as coconut, palm kernel, or palm oil. Seasoning and other foods Salted popcorn and pretzels. Onion salt, garlic salt, seasoned salt, table salt, and sea salt. Worcestershire sauce. Tartar sauce. Barbecue sauce. Teriyaki sauce. Soy sauce, including reduced-sodium. Steak sauce. Canned and packaged gravies. Fish  sauce. Oyster sauce. Cocktail sauce. Horseradish that you find on the shelf. Ketchup. Mustard. Meat flavorings and tenderizers. Bouillon cubes. Hot sauce and Tabasco sauce. Premade or packaged marinades. Premade or packaged taco seasonings. Relishes. Regular salad dressings. Where to find more information:  National Heart, Lung, and [place]: https://wilson-eaton.com/  American Heart Association: www.heart.org Summary  The DASH eating plan is a healthy eating plan that has been shown to reduce high blood pressure (hypertension). It may also reduce your risk for type 2 diabetes, heart disease, and stroke.  With the DASH eating plan, you should limit salt (sodium) intake to 2,300 mg a day. If you have hypertension, you may need to reduce your sodium intake to 1,500 mg a day.  When on the DASH eating plan, aim to eat more fresh fruits and vegetables, whole grains, lean proteins, low-fat dairy, and heart-healthy fats.  Work with your health care provider or  diet and nutrition specialist (dietitian) to adjust your eating plan to your individual calorie needs. This information is not intended to replace advice given to you by your health care provider. Make sure you discuss any questions you have with your health care provider. Document Released: 07/25/2011 Document Revised: 07/29/2016 Document Reviewed: 07/29/2016 Elsevier Interactive Patient Education  [employer].

## 2017-09-22 ENCOUNTER — Telehealth (HOSPITAL_COMMUNITY): Payer: Self-pay | Admitting: *Deleted

## 2017-09-22 DIAGNOSIS — J189 Pneumonia, unspecified organism: Secondary | ICD-10-CM | POA: Diagnosis not present

## 2017-09-22 DIAGNOSIS — I509 Heart failure, unspecified: Secondary | ICD-10-CM | POA: Diagnosis not present

## 2017-09-22 DIAGNOSIS — J9690 Respiratory failure, unspecified, unspecified whether with hypoxia or hypercapnia: Secondary | ICD-10-CM | POA: Diagnosis not present

## 2017-09-22 NOTE — Telephone Encounter (Signed)
Left message on voicemail in reference to upcoming appointment scheduled for 09/24/17. Phone number given for a call back so details instructions can be given. Hasspacher, Ranae Palms

## 2017-09-23 ENCOUNTER — Telehealth (HOSPITAL_COMMUNITY): Payer: Self-pay

## 2017-09-23 NOTE — Telephone Encounter (Signed)
Patient given detailed instructions per Myocardial Perfusion Study Information Sheet for the test on 09/24/17 at 0945. Patient notified to arrive 15 minutes early and that it is imperative to arrive on time for appointment to keep from having the test rescheduled.  If you need to cancel or reschedule your appointment, please call the office within 24 hours of your appointment. . Patient verbalized understanding. Janifer Adie, CNMT, RT-N

## 2017-09-24 ENCOUNTER — Ambulatory Visit (HOSPITAL_COMMUNITY): Payer: Medicare Other | Attending: Cardiology

## 2017-09-24 DIAGNOSIS — R0609 Other forms of dyspnea: Secondary | ICD-10-CM | POA: Diagnosis not present

## 2017-09-24 DIAGNOSIS — R7989 Other specified abnormal findings of blood chemistry: Secondary | ICD-10-CM

## 2017-09-24 DIAGNOSIS — R55 Syncope and collapse: Secondary | ICD-10-CM | POA: Diagnosis not present

## 2017-09-24 DIAGNOSIS — I2510 Atherosclerotic heart disease of native coronary artery without angina pectoris: Secondary | ICD-10-CM | POA: Diagnosis not present

## 2017-09-24 DIAGNOSIS — I10 Essential (primary) hypertension: Secondary | ICD-10-CM | POA: Diagnosis not present

## 2017-09-24 DIAGNOSIS — R778 Other specified abnormalities of plasma proteins: Secondary | ICD-10-CM

## 2017-09-24 DIAGNOSIS — R748 Abnormal levels of other serum enzymes: Secondary | ICD-10-CM

## 2017-09-24 DIAGNOSIS — I5031 Acute diastolic (congestive) heart failure: Secondary | ICD-10-CM | POA: Diagnosis not present

## 2017-09-24 LAB — MYOCARDIAL PERFUSION IMAGING
LV dias vol: 128 mL (ref 62–150)
LV sys vol: 59 mL
Peak HR: 94 {beats}/min
RATE: 0.33
Rest HR: 72 {beats}/min
SDS: 4
SRS: 7
SSS: 11
TID: 0.98

## 2017-09-24 MED ORDER — TECHNETIUM TC 99M TETROFOSMIN IV KIT
10.5000 | PACK | Freq: Once | INTRAVENOUS | Status: AC | PRN
  Administered 2017-09-24: 10.5 via INTRAVENOUS
  Filled 2017-09-24: qty 11

## 2017-09-24 MED ORDER — REGADENOSON 0.4 MG/5ML IV SOLN
0.4000 mg | Freq: Once | INTRAVENOUS | Status: AC
  Administered 2017-09-24: 0.4 mg via INTRAVENOUS

## 2017-09-24 MED ORDER — TECHNETIUM TC 99M TETROFOSMIN IV KIT
33.0000 | PACK | Freq: Once | INTRAVENOUS | Status: AC | PRN
  Administered 2017-09-24: 33 via INTRAVENOUS
  Filled 2017-09-24: qty 33

## 2017-09-25 ENCOUNTER — Telehealth: Payer: Self-pay | Admitting: Cardiovascular Disease

## 2017-09-25 NOTE — Telephone Encounter (Signed)
Informed patient of results and verbal understanding expressed.  

## 2017-09-25 NOTE — Telephone Encounter (Signed)
-----  Message from [place], [place] sent at 09/24/2017  2:18 PM EST ----- Low risk study without ischemia.

## 2017-09-25 NOTE — Telephone Encounter (Signed)
New message    Patient calling for stress test results. Please call

## 2017-09-29 ENCOUNTER — Ambulatory Visit (INDEPENDENT_AMBULATORY_CARE_PROVIDER_SITE_OTHER): Payer: Medicare Other | Admitting: Pulmonary Disease

## 2017-09-29 ENCOUNTER — Encounter: Payer: Self-pay | Admitting: Pulmonary Disease

## 2017-09-29 VITALS — BP 114/72 | HR 81 | Ht 75.0 in | Wt 250.6 lb

## 2017-09-29 DIAGNOSIS — I272 Pulmonary hypertension, unspecified: Secondary | ICD-10-CM | POA: Insufficient documentation

## 2017-09-29 DIAGNOSIS — J432 Centrilobular emphysema: Secondary | ICD-10-CM | POA: Diagnosis not present

## 2017-09-29 DIAGNOSIS — I2729 Other secondary pulmonary hypertension: Secondary | ICD-10-CM

## 2017-09-29 DIAGNOSIS — R0902 Hypoxemia: Secondary | ICD-10-CM | POA: Diagnosis not present

## 2017-09-29 MED ORDER — TIOTROPIUM BROMIDE MONOHYDRATE 1.25 MCG/ACT IN AERS: 2.0000 | INHALATION_SPRAY | Freq: Every day | RESPIRATORY_TRACT | 0 refills | Status: DC

## 2017-09-29 NOTE — Assessment & Plan Note (Signed)
Will need repeat echo in a few months. Likely secondary to severe emphysema, diastolic heart failure and PE

## 2017-09-29 NOTE — Assessment & Plan Note (Signed)
Referral to pulmonary rehab program. Trial of Stiolto -would be curious to see if he has better results with a Respimat type inhaler

## 2017-09-29 NOTE — Progress Notes (Signed)
Subjective:    Patient ID: Joshua [name], male    DOB: [birth year], 82 y.o.   MRN: [id number]  HPI 82 yo former smoker for FU of COPD  He has h/o PE postop after a knee replacement in 2016, and platelets.  After a year. He was hospitalized 08/2017 for 3 days for acute hypoxic respiratory failure, attributed to diastolic heart failure.  He was diuresed 2.5 L, had elevated troponin, follow-up stress Myoview was low risk for ischemia. CT angiogram 08/2017 was reviewed which showed left upper lobe nodule 9x6 mm. Echo showed normal LV function with RVSP 73 mm  He was discharged on oxygen but she has been compliant On walking today on room air he desaturated to 90% and heart rate went up to 104 on room air. Have reviewed hospitalization record  He denies orthopnea, reports chronic left pedal edema In the past, he has been tried on Brio, anoro & trelegy  none of these seem to provide him any symptomatic relief or dyspnea  Significant tests/ events reviewed   09/15/14 CT chest showed a large bilateral pulmonary embolism with RV dilatation. 08/2014 Venous Doppler showed no DVT 08/2014 2-D echo showed an EF of 57-02%, grade 1 diastolic dysfunction, Right atrium dilatation and severely increased pulmonary artery pressures around 68 mm Hg   PFTs 11/2014 - FEV1 59%, no BD response, ratio 70, TLC 68%, DLCO 35%  Echo 08/2015 slight dilated RV   Past Medical History:  Diagnosis Date  . Arthritis   . BPH (benign prostatic hyperplasia)   . COPD (chronic obstructive pulmonary disease) ([place])   . Folliculitis    posterior scalp per office visit note of Dr Maudie Mercury 07/20/2014    . GERD (gastroesophageal reflux disease)   . Hypertension   . PE (pulmonary thromboembolism) ([place])   . Phlebitis    right arm  at least 20 years ago   . Pneumonia    hx of pneumonia as a child      Review of Systems neg for any significant sore throat, dysphagia, itching, sneezing, nasal congestion or excess/ purulent  secretions, fever, chills, sweats, unintended wt loss, pleuritic or exertional cp, hempoptysis, orthopnea pnd or change in chronic leg swelling. Also denies presyncope, palpitations, heartburn, abdominal pain, nausea, vomiting, diarrhea or change in bowel or urinary habits, dysuria,hematuria, rash, arthralgias, visual complaints, headache, numbness weakness or ataxia.     Objective:   Physical Exam  Gen. Pleasant, obese, in no distress ENT - no lesions, no post nasal drip Neck: No JVD, no thyromegaly, no carotid bruits Lungs: no use of accessory muscles, no dullness to percussion, decreased without rales or rhonchi  Cardiovascular: Rhythm regular, heart sounds  normal, no murmurs or gallops, no peripheral edema Musculoskeletal: No deformities, no cyanosis or clubbing , no tremors        Assessment & Plan:

## 2017-09-29 NOTE — Patient Instructions (Signed)
Referral to pulmonary rehab program.  Trial of stiolto once daily

## 2017-09-29 NOTE — Assessment & Plan Note (Signed)
Resolved, does not desaturate on exertion.. Can discontinue oxygen

## 2017-09-30 ENCOUNTER — Telehealth (HOSPITAL_COMMUNITY): Payer: Self-pay

## 2017-09-30 NOTE — Telephone Encounter (Signed)
Referral received. Passed to RN for review. Insurance benefits and eligibility to be determined.

## 2017-10-07 ENCOUNTER — Telehealth (HOSPITAL_COMMUNITY): Payer: Self-pay

## 2017-10-07 NOTE — Telephone Encounter (Signed)
Patients insurance is active and benefits verified through Medicare Part A & B - 20% co-insurance.  Patients insurance is active and benefits verified through [facility] what Medicare does not.  Patient is covered at 100%

## 2017-10-08 ENCOUNTER — Other Ambulatory Visit: Payer: Self-pay

## 2017-10-08 ENCOUNTER — Telehealth (HOSPITAL_COMMUNITY): Payer: Self-pay

## 2017-10-08 DIAGNOSIS — J441 Chronic obstructive pulmonary disease with (acute) exacerbation: Secondary | ICD-10-CM

## 2017-10-08 NOTE — Telephone Encounter (Signed)
Called patient in regards to Pulmonary Rehab - Patient is interested in the program. Patient stated he wants to attend the 10:30am exc class. I explained to patient once a spot becomes available in that class we will give a call to get him scheduled. Patient stated he understands. Went over insurance with patient as well and stated he understands.

## 2017-10-09 ENCOUNTER — Telehealth (HOSPITAL_COMMUNITY): Payer: Self-pay

## 2017-10-09 NOTE — Telephone Encounter (Signed)
Called to schedule patient for Pulmonary Rehab - Scheduled orientation on 11/03/2017 at 9:30am. Patient will attend the 10:30am exc class.

## 2017-10-13 ENCOUNTER — Telehealth: Payer: Self-pay | Admitting: Pulmonary Disease

## 2017-10-13 MED ORDER — TIOTROPIUM BROMIDE MONOHYDRATE 1.25 MCG/ACT IN AERS: 2.0000 | INHALATION_SPRAY | Freq: Every day | RESPIRATORY_TRACT | 3 refills | Status: DC

## 2017-10-13 NOTE — Telephone Encounter (Signed)
Called and spoke with patient, medication refill has been sent to patients pharmacy. Nothing further needed.

## 2017-11-03 ENCOUNTER — Encounter (HOSPITAL_COMMUNITY): Payer: Self-pay

## 2017-11-03 ENCOUNTER — Encounter (HOSPITAL_COMMUNITY)
Admission: RE | Admit: 2017-11-03 | Discharge: 2017-11-03 | Disposition: A | Discharge status: Home / Self Care | Payer: Medicare Other | Source: Ambulatory Visit | Attending: Pulmonary Disease | Admitting: Pulmonary Disease

## 2017-11-03 VITALS — BP 112/59 | HR 87 | Resp 18 | Ht 73.25 in | Wt 255.5 lb

## 2017-11-03 DIAGNOSIS — J432 Centrilobular emphysema: Secondary | ICD-10-CM | POA: Insufficient documentation

## 2017-11-03 NOTE — Progress Notes (Signed)
Joshua [name] 81 y.o. male Pulmonary Rehab Orientation Note Patient arrived today in Cardiac and Pulmonary Rehab for orientation to Pulmonary Rehab. He was transported from [facility] via wheel chair. He no longer carriers portable oxygen nor has oxygen for home use. This was discontinued by his provider. Color good, skin warm and dry. Patient is oriented to time and place. Patient's medical history, psychosocial health, and medications reviewed. Psychosocial assessment reveals pt lives with their spouse. Pt is currently working a  part time job as a Psychologist, forensic for [employer] He has retired twice, first from [employer] and subsequently from [employer]. Pt hobbies include being an active member to private and civic organizations such as Administrator, [employer]. Pt reports his stress level is low. Pt does not exhibit signs of depression. PHQ2/9 score 0/na. Pt shows good  coping skills with positive outlook. He is offered emotional support and reassurance. Will continue to monitor and evaluate progress toward psychosocial goal(s) of maintaining a positive attitude about his ability to participate in pulmonary rehab. Physical assessment reveals heart rate is normal, breath sounds clear to auscultation, no wheezes, rales, or rhonchi. Grip strength equal, strong. Distal pulses palpable. Mild pitting edema noted to lower legs, R>L.Patient reports he does take medications as prescribed. Patient states he follows a Low Sodium diet however he knows he has room for improvement. He and his wife do well with their sodium intake when eating at home however the do eat meals out on weekends. The patient reports no specific efforts to gain or lose weight.. Patient's weight will be monitored closely. Demonstration and practice of PLB using pulse oximeter. Patient able to return demonstration satisfactorily. Safety and hand hygiene in the exercise area reviewed with patient. Patient voices understanding of the information  reviewed. Department expectations discussed with patient and achievable goals were set. The patient shows enthusiasm about attending the program and we look forward to working with this nice gentleman. The patient is scheduled for a 6 min walk test on 11/04/17 and to begin exercise on 11/13/17 at 1030.   45 minutes was spent on a variety of activities such as assessment of the patient, obtaining baseline data including height, weight, BMI, and grip strength, verifying medical history, allergies, and current medications, and teaching patient strategies for performing tasks with less respiratory effort with emphasis on pursed lip breathing.

## 2017-11-04 ENCOUNTER — Encounter (HOSPITAL_COMMUNITY)
Admission: RE | Admit: 2017-11-04 | Discharge: 2017-11-04 | Disposition: A | Discharge status: Home / Self Care | Payer: Medicare Other | Source: Ambulatory Visit | Attending: Pulmonary Disease | Admitting: Pulmonary Disease

## 2017-11-04 DIAGNOSIS — J432 Centrilobular emphysema: Secondary | ICD-10-CM | POA: Diagnosis not present

## 2017-11-05 ENCOUNTER — Encounter (HOSPITAL_COMMUNITY): Payer: Self-pay | Admitting: *Deleted

## 2017-11-06 NOTE — Progress Notes (Signed)
Pulmonary Individual Treatment Plan  Patient Details  Name: Joshua [name] MRN: [id number] Date of Birth: 10/03/1935 Referring Provider:     Pulmonary Rehab Walk Test from 11/04/2017 in [place]  Referring Provider  [name]       Initial Encounter Date:    Pulmonary Rehab Walk Test from 11/04/2017 in [place]  Date  11/06/17  Referring Provider  [name]       Visit Diagnosis: Centrilobular emphysema ([place])  Patient's Home Medications on Admission:   Current Outpatient Medications:  .  albuterol (PROVENTIL HFA;VENTOLIN HFA) 108 (90 Base) MCG/ACT inhaler, Inhale 1-2 puffs into the lungs every 6 (six) hours as needed for wheezing or shortness of breath., Disp: 1 Inhaler, Rfl: 3 .  allopurinol (ZYLOPRIM) 100 MG tablet, Take 100 mg by mouth daily., Disp: , Rfl:  .  aspirin 81 MG tablet, Take 81 mg by mouth daily., Disp: , Rfl:  .  Cinnamon 500 MG capsule, Take 500 mg by mouth daily., Disp: , Rfl:  .  doxazosin (CARDURA) 4 MG tablet, Take 4 mg by mouth at bedtime. Patient takes in the pm, Disp: , Rfl:  .  furosemide (LASIX) 20 MG tablet, Take 20-40 mg by mouth daily. Take 20 mg everyday on Monday, Wednesday and Friday take 40 mg, Disp: , Rfl:  .  pantoprazole (PROTONIX) 40 MG tablet, Take 40 mg by mouth daily., Disp: , Rfl:  .  ramipril (ALTACE) 2.5 MG capsule, Take 2.5 mg by mouth daily., Disp: , Rfl:  .  Tiotropium Bromide Monohydrate (SPIRIVA RESPIMAT) 1.25 MCG/ACT AERS, Inhale 2 puffs into the lungs daily., Disp: 1 Inhaler, Rfl: 3  Past Medical History: Past Medical History:  Diagnosis Date  . Arthritis   . BPH (benign prostatic hyperplasia)   . COPD (chronic obstructive pulmonary disease) ([place])   . Folliculitis    posterior scalp per office visit note of Dr Maudie Mercury 07/20/2014    . GERD (gastroesophageal reflux disease)   . Hypertension   . PE (pulmonary thromboembolism) ([place])   . Phlebitis    right arm  at least  20 years ago   . Pneumonia    hx of pneumonia as a child     Tobacco Use: Social History   Tobacco Use  Smoking Status Former Smoker  . Packs/day: 1.00  . Years: 50.00  . Pack years: 50.00  . Types: Cigarettes  . Last attempt to quit: 08/19/2006  . Years since quitting: 11.2  Smokeless Tobacco Never Used    Labs: Recent Review Flowsheet Data    There is no flowsheet data to display.      Capillary Blood Glucose: Lab Results  Component Value Date   GLUCAP 123 (H) 11/24/2008     Pulmonary Assessment Scores: Pulmonary Assessment Scores    Row Name 11/04/17 1647 11/05/17 1224       ADL UCSD   ADL Phase  Entry  Entry    SOB Score total  -  27      CAT Score   CAT Score  -  11 Entry      mMRC Score   mMRC Score  1  -       Pulmonary Function Assessment: Pulmonary Function Assessment - 11/03/17 1032      Breath   Bilateral Breath Sounds  Clear;Decreased    Shortness of Breath  Yes       Exercise Target Goals: Date: 11/06/17  Exercise Program Goal: Individual exercise prescription set using results from initial 6 min walk test and THRR while considering  patient's activity barriers and safety.    Exercise Prescription Goal: Initial exercise prescription builds to 30-45 minutes a day of aerobic activity, 2-3 days per week.  Home exercise guidelines will be given to patient during program as part of exercise prescription that the participant will acknowledge.  Activity Barriers & Risk Stratification: Activity Barriers & Cardiac Risk Stratification - 11/03/17 1013      Activity Barriers & Cardiac Risk Stratification   Activity Barriers  Shortness of Breath;Left Knee Replacement;History of Falls left knee replacement does not interfer with activity       6 Minute Walk: 6 Minute Walk    Row Name 11/06/17 0702         6 Minute Walk   Phase  Initial     Distance  1100 feet     Walk Time  6 minutes     # of Rest Breaks  0     MPH  2.08     METS   2.53     RPE  12     Perceived Dyspnea   3     Symptoms  No     Resting HR  95 bpm     Resting BP  104/80     Resting Oxygen Saturation   97 %     Exercise Oxygen Saturation  during 6 min walk  88 %     Max Ex. HR  130 bpm     Max Ex. BP  104/64       Interval HR   1 Minute HR  104     2 Minute HR  104     3 Minute HR  96     4 Minute HR  121     5 Minute HR  129     6 Minute HR  130     2 Minute Post HR  118     Interval Heart Rate?  Yes       Interval Oxygen   Interval Oxygen?  Yes     Baseline Oxygen Saturation %  97 %     1 Minute Oxygen Saturation %  93 %     1 Minute Liters of Oxygen  0 L     2 Minute Oxygen Saturation %  93 %     2 Minute Liters of Oxygen  0 L     3 Minute Oxygen Saturation %  89 %     3 Minute Liters of Oxygen  0 L     4 Minute Oxygen Saturation %  88 %     4 Minute Liters of Oxygen  0 L     5 Minute Oxygen Saturation %  88 %     5 Minute Liters of Oxygen  0 L     6 Minute Oxygen Saturation %  88 %     6 Minute Liters of Oxygen  0 L     2 Minute Post Oxygen Saturation %  88 %     2 Minute Post Liters of Oxygen  0 L        Oxygen Initial Assessment: Oxygen Initial Assessment - 11/04/17 1646      Initial 6 min Walk   Oxygen Used  None      Program Oxygen Prescription   Program Oxygen Prescription  None  Oxygen Re-Evaluation:   Oxygen Discharge (Final Oxygen Re-Evaluation):   Initial Exercise Prescription: Initial Exercise Prescription - 11/06/17 0700      Date of Initial Exercise RX and Referring Provider   Date  11/06/17    Referring Provider  [name]       Bike   Level  0.6    Minutes  17      NuStep   Level  2    SPM  80    Minutes  17    METs  1.5      Track   Laps  10    Minutes  17      Prescription Details   Frequency (times per week)  2    Duration  Progress to 45 minutes of aerobic exercise without signs/symptoms of physical distress      Intensity   THRR 40-80% of Max Heartrate  56-113     Ratings of Perceived Exertion  11-13    Perceived Dyspnea  0-4      Progression   Progression  Continue progressive overload as per policy without signs/symptoms or physical distress.      Resistance Training   Training Prescription  Yes    Weight  blue bands    Reps  10-15       Perform Capillary Blood Glucose checks as needed.  Exercise Prescription Changes:   Exercise Comments:   Exercise Goals and Review: Exercise Goals    Row Name 11/03/17 1018             Exercise Goals   Increase Physical Activity  Yes       Intervention  Provide advice, education, support and counseling about physical activity/exercise needs.;Develop an individualized exercise prescription for aerobic and resistive training based on initial evaluation findings, risk stratification, comorbidities and participant's personal goals.       Expected Outcomes  Short Term: Attend rehab on a regular basis to increase amount of physical activity.;Long Term: Add in home exercise to make exercise part of routine and to increase amount of physical activity.;Long Term: Exercising regularly at least 3-5 days a week.       Increase Strength and Stamina  Yes       Intervention  Provide advice, education, support and counseling about physical activity/exercise needs.;Develop an individualized exercise prescription for aerobic and resistive training based on initial evaluation findings, risk stratification, comorbidities and participant's personal goals.       Expected Outcomes  Short Term: Increase workloads from initial exercise prescription for resistance, speed, and METs.;Short Term: Perform resistance training exercises routinely during rehab and add in resistance training at home;Long Term: Improve cardiorespiratory fitness, muscular endurance and strength as measured by increased METs and functional capacity (6MWT)       Able to understand and use rate of perceived exertion (RPE) scale  Yes       Intervention   Provide education and explanation on how to use RPE scale       Expected Outcomes  Short Term: Able to use RPE daily in rehab to express subjective intensity level;Long Term:  Able to use RPE to guide intensity level when exercising independently       Able to understand and use Dyspnea scale  Yes       Intervention  Provide education and explanation on how to use Dyspnea scale       Expected Outcomes  Short Term: Able to use Dyspnea scale daily in rehab to express  subjective sense of shortness of breath during exertion;Long Term: Able to use Dyspnea scale to guide intensity level when exercising independently       Knowledge and understanding of Target Heart Rate Range (THRR)  Yes       Intervention  Provide education and explanation of THRR including how the numbers were predicted and where they are located for reference       Expected Outcomes  Short Term: Able to state/look up THRR;Short Term: Able to use daily as guideline for intensity in rehab;Long Term: Able to use THRR to govern intensity when exercising independently       Understanding of Exercise Prescription  Yes       Intervention  Provide education, explanation, and written materials on patient's individual exercise prescription       Expected Outcomes  Short Term: Able to explain program exercise prescription;Long Term: Able to explain home exercise prescription to exercise independently          Exercise Goals Re-Evaluation :   Discharge Exercise Prescription (Final Exercise Prescription Changes):   Nutrition:  Target Goals: Understanding of nutrition guidelines, daily intake of sodium <1575m, cholesterol <2018m calories 30% from fat and 7% or less from saturated fats, daily to have 5 or more servings of fruits and vegetables.  Biometrics: Pre Biometrics - 11/03/17 1040      Pre Biometrics   Grip Strength  37 kg        Nutrition Therapy Plan and Nutrition Goals:   Nutrition Assessments:   Nutrition Goals  Re-Evaluation:   Nutrition Goals Discharge (Final Nutrition Goals Re-Evaluation):   Psychosocial: Target Goals: Acknowledge presence or absence of significant depression and/or stress, maximize coping skills, provide positive support system. Participant is able to verbalize types and ability to use techniques and skills needed for reducing stress and depression.  Initial Review & Psychosocial Screening: Initial Psych Review & Screening - 11/03/17 1033      Initial Review   Current issues with  None Identified      Family Dynamics   Good Support System?  Yes      Barriers   Psychosocial barriers to participate in program  There are no identifiable barriers or psychosocial needs.      Screening Interventions   Interventions  Encouraged to exercise       Quality of Life Scores:  Scores of 19 and below usually indicate a poorer quality of life in these areas.  A difference of  2-3 points is a clinically meaningful difference.  A difference of 2-3 points in the total score of the Quality of Life Index has been associated with significant improvement in overall quality of life, self-image, physical symptoms, and general health in studies assessing change in quality of life.   PHQ-9: Recent Review Flowsheet Data    [facility] 11/03/2017   Decreased Interest 0   Down, Depressed, Hopeless 0   PHQ - 2 Score 0     Interpretation of Total Score  Total Score Depression Severity:  1-4 = Minimal depression, 5-9 = Mild depression, 10-14 = Moderate depression, 15-19 = Moderately severe depression, 20-27 = Severe depression   Psychosocial Evaluation and Intervention: Psychosocial Evaluation - 11/03/17 1033      Psychosocial Evaluation & Interventions   Interventions  Encouraged to exercise with the program and follow exercise prescription    Expected Outcomes  patient will remain free from psychosocial barriers to participation in pulmonary rehab    Continue Psychosocial  Services   No Follow up required       Psychosocial Re-Evaluation:   Psychosocial Discharge (Final Psychosocial Re-Evaluation):   Education: Education Goals: Education classes will be provided on a weekly basis, covering required topics. Participant will state understanding/return demonstration of topics presented.  Learning Barriers/Preferences: Learning Barriers/Preferences - 11/03/17 1032      Learning Barriers/Preferences   Learning Barriers  None    Learning Preferences  Verbal Instruction;Written Material;Group Instruction;Individual Instruction       Education Topics: Risk Factor Reduction:  -Group instruction that is supported by a PowerPoint presentation. Instructor discusses the definition of a risk factor, different risk factors for pulmonary disease, and how the heart and lungs work together.     Nutrition for Pulmonary Patient:  -Group instruction provided by PowerPoint slides, verbal discussion, and written materials to support subject matter. The instructor gives an explanation and review of healthy diet recommendations, which includes a discussion on weight management, recommendations for fruit and vegetable consumption, as well as protein, fluid, caffeine, fiber, sodium, sugar, and alcohol. Tips for eating when patients are short of breath are discussed.   Pursed Lip Breathing:  -Group instruction that is supported by demonstration and informational handouts. Instructor discusses the benefits of pursed lip and diaphragmatic breathing and detailed demonstration on how to preform both.     Oxygen Safety:  -Group instruction provided by PowerPoint, verbal discussion, and written material to support subject matter. There is an overview of "What is Oxygen" and "Why do we need it".  Instructor also reviews how to create a safe environment for oxygen use, the importance of using oxygen as prescribed, and the risks of noncompliance. There is a brief discussion on  traveling with oxygen and resources the patient may utilize.   Oxygen Equipment:  -Group instruction provided by [facility] Staff utilizing handouts, written materials, and equipment demonstrations.   Signs and Symptoms:  -Group instruction provided by written material and verbal discussion to support subject matter. Warning signs and symptoms of infection, stroke, and heart attack are reviewed and when to call the physician/911 reinforced. Tips for preventing the spread of infection discussed.   Advanced Directives:  -Group instruction provided by verbal instruction and written material to support subject matter. Instructor reviews Advanced Directive laws and proper instruction for filling out document.   Pulmonary Video:  -Group video education that reviews the importance of medication and oxygen compliance, exercise, good nutrition, pulmonary hygiene, and pursed lip and diaphragmatic breathing for the pulmonary patient.   Exercise for the Pulmonary Patient:  -Group instruction that is supported by a PowerPoint presentation. Instructor discusses benefits of exercise, core components of exercise, frequency, duration, and intensity of an exercise routine, importance of utilizing pulse oximetry during exercise, safety while exercising, and options of places to exercise outside of rehab.     Pulmonary Medications:  -Verbally interactive group education provided by instructor with focus on inhaled medications and proper administration.   Anatomy and Physiology of the Respiratory System and Intimacy:  -Group instruction provided by PowerPoint, verbal discussion, and written material to support subject matter. Instructor reviews respiratory cycle and anatomical components of the respiratory system and their functions. Instructor also reviews differences in obstructive and restrictive respiratory diseases with examples of each. Intimacy, Sex, and Sexuality differences are reviewed with a  discussion on how relationships can change when diagnosed with pulmonary disease. Common sexual concerns are reviewed.   MD DAY -A group question and answer session with a medical doctor that allows  participants to ask questions that relate to their pulmonary disease state.   OTHER EDUCATION -Group or individual verbal, written, or video instructions that support the educational goals of the pulmonary rehab program.   Holiday Eating Survival Tips:  -Group instruction provided by PowerPoint slides, verbal discussion, and written materials to support subject matter. The instructor gives patients tips, tricks, and techniques to help them not only survive but enjoy the holidays despite the onslaught of food that accompanies the holidays.   Knowledge Questionnaire Score: Knowledge Questionnaire Score - 11/05/17 1224      Knowledge Questionnaire Score   Pre Score  16/18       Core Components/Risk Factors/Patient Goals at Admission: Personal Goals and Risk Factors at Admission - 11/03/17 1033      Core Components/Risk Factors/Patient Goals on Admission   Improve shortness of breath with ADL's  Yes    Intervention  Provide education, individualized exercise plan and daily activity instruction to help decrease symptoms of SOB with activities of daily living.    Expected Outcomes  Short Term: Improve cardiorespiratory fitness to achieve a reduction of symptoms when performing ADLs;Long Term: Be able to perform more ADLs without symptoms or delay the onset of symptoms    Heart Failure  Yes    Intervention  Provide a combined exercise and nutrition program that is supplemented with education, support and counseling about heart failure. Directed toward relieving symptoms such as shortness of breath, decreased exercise tolerance, and extremity edema.    Expected Outcomes  Improve functional capacity of life;Short term: Attendance in program 2-3 days a week with increased exercise capacity. Reported  lower sodium intake. Reported increased fruit and vegetable intake. Reports medication compliance.;Short term: Daily weights obtained and reported for increase. Utilizing diuretic protocols set by physician.;Long term: Adoption of self-care skills and reduction of barriers for early signs and symptoms recognition and intervention leading to self-care maintenance.       Core Components/Risk Factors/Patient Goals Review:    Core Components/Risk Factors/Patient Goals at Discharge (Final Review):    ITP Comments:   Comments:

## 2017-11-13 ENCOUNTER — Encounter (HOSPITAL_COMMUNITY)
Admission: RE | Admit: 2017-11-13 | Discharge: 2017-11-13 | Disposition: A | Discharge status: Home / Self Care | Payer: Medicare Other | Source: Ambulatory Visit | Attending: Pulmonary Disease | Admitting: Pulmonary Disease

## 2017-11-13 DIAGNOSIS — J432 Centrilobular emphysema: Secondary | ICD-10-CM

## 2017-11-13 NOTE — Progress Notes (Signed)
Daily Session Note  Patient Details  Name: Joshua [name] MRN: [id number] Date of Birth: [birth year] Referring Provider:     Pulmonary Rehab Walk Test from 11/04/2017 in [place]  Referring Provider  [name]       Encounter Date: 11/13/2017  Check In: Session Check In - 11/13/17 1229      Check-In   Location  [facility]    Staff Present  Trish Fountain, RN, BSN;Molly diVincenzo, MS, ACSM RCEP, Exercise Physiologist;Lisa Ysidro Evert, RN    Supervising physician immediately available to respond to emergencies  [facility] immediately available    Physician(s)  Dr. Eliseo Squires    Medication changes reported      No    Fall or balance concerns reported     No    Tobacco Cessation  No Change    Warm-up and Cool-down  Performed as group-led instruction    Resistance Training Performed  Yes    VAD Patient?  No      Pain Assessment   Currently in Pain?  No/denies    Multiple Pain Sites  No       Capillary Blood Glucose: No results found for this or any previous visit (from the past 24 hour(s)).    Social History   Tobacco Use  Smoking Status Former Smoker  . Packs/day: 1.00  . Years: 50.00  . Pack years: 50.00  . Types: Cigarettes  . Last attempt to quit: 08/19/2006  . Years since quitting: 11.2  Smokeless Tobacco Never Used    Goals Met:  Exercise tolerated well No report of cardiac concerns or symptoms Strength training completed today  Goals Unmet:  Not Applicable  Comments:Service time is from 1030 to 1215      [name] is Medical Director for Pulmonary Rehab at [facility].

## 2017-11-18 ENCOUNTER — Encounter (HOSPITAL_COMMUNITY)
Admission: RE | Admit: 2017-11-18 | Discharge: 2017-11-18 | Disposition: A | Discharge status: Home / Self Care | Payer: Medicare Other | Source: Ambulatory Visit | Attending: Pulmonary Disease | Admitting: Pulmonary Disease

## 2017-11-18 VITALS — Wt 252.6 lb

## 2017-11-18 DIAGNOSIS — J432 Centrilobular emphysema: Secondary | ICD-10-CM | POA: Diagnosis not present

## 2017-11-18 NOTE — Progress Notes (Signed)
Daily Session Note  Patient Details  Name: Joshua [name] MRN: [id number] Date of Birth: [birth year] Referring Provider:     Pulmonary Rehab Walk Test from 11/04/2017 in [place]  Referring Provider  [name]       Encounter Date: 11/18/2017  Check In: Session Check In - 11/18/17 1224      Check-In   Location  [facility]    Staff Present  Trish Fountain, RN, BSN;Molly diVincenzo, MS, ACSM RCEP, Exercise Physiologist;Lisa Ysidro Evert, RN    Supervising physician immediately available to respond to emergencies  [facility] immediately available    Physician(s)  Dr. Posey Pronto    Medication changes reported      No    Fall or balance concerns reported     No    Tobacco Cessation  No Change    Warm-up and Cool-down  Performed as group-led instruction    Resistance Training Performed  Yes    VAD Patient?  No      Pain Assessment   Currently in Pain?  No/denies    Multiple Pain Sites  No       Capillary Blood Glucose: No results found for this or any previous visit (from the past 24 hour(s)).  Exercise Prescription Changes - 11/18/17 1200      Response to Exercise   Blood Pressure (Admit)  98/75    Blood Pressure (Exercise)  96/60    Blood Pressure (Exit)  106/69    Heart Rate (Admit)  85 bpm    Heart Rate (Exercise)  125 bpm    Heart Rate (Exit)  103 bpm    Oxygen Saturation (Admit)  98 %    Oxygen Saturation (Exercise)  89 %    Oxygen Saturation (Exit)  94 %    Rating of Perceived Exertion (Exercise)  13    Perceived Dyspnea (Exercise)  1    Duration  Progress to 45 minutes of aerobic exercise without signs/symptoms of physical distress    Intensity  THRR unchanged      Progression   Progression  Continue to progress workloads to maintain intensity without signs/symptoms of physical distress.      Resistance Training   Training Prescription  Yes    Weight  blue bands    Reps  10-15      Bike   Level  0.6    Minutes   17      NuStep   Level  4    SPM  80    Minutes  17    METs  2.4      Track   Laps  14    Minutes  17       Social History   Tobacco Use  Smoking Status Former Smoker  . Packs/day: 1.00  . Years: 50.00  . Pack years: 50.00  . Types: Cigarettes  . Last attempt to quit: 08/19/2006  . Years since quitting: 11.2  Smokeless Tobacco Never Used    Goals Met:  Exercise tolerated well No report of cardiac concerns or symptoms Strength training completed today  Goals Unmet:  Not Applicable  Comments: Service time is from 10:30a to 12:10p    [name] is Medical Director for Pulmonary Rehab at [facility].

## 2017-11-20 ENCOUNTER — Encounter (HOSPITAL_COMMUNITY)
Admission: RE | Admit: 2017-11-20 | Discharge: 2017-11-20 | Disposition: A | Discharge status: Home / Self Care | Payer: Medicare Other | Source: Ambulatory Visit | Attending: Pulmonary Disease | Admitting: Pulmonary Disease

## 2017-11-20 DIAGNOSIS — J432 Centrilobular emphysema: Secondary | ICD-10-CM

## 2017-11-20 NOTE — Progress Notes (Addendum)
Daily Session Note  Patient Details  Name: EZARIAH NACE MRN: [id number] Date of Birth: [birth year] Referring Provider:     Pulmonary Rehab Walk Test from 11/04/2017 in [place]  Referring Provider  [name]       Encounter Date: 11/20/2017  Check In: Session Check In - 11/20/17 1226      Check-In   Location  [facility]    Staff Present  Trish Fountain, RN, BSN;Molly diVincenzo, MS, ACSM RCEP, Exercise Physiologist    Supervising physician immediately available to respond to emergencies  [facility] immediately available    Physician(s)  Dr. Lonny Prude    Medication changes reported      No    Fall or balance concerns reported     No    Tobacco Cessation  No Change    Warm-up and Cool-down  Performed as group-led instruction    Resistance Training Performed  Yes    VAD Patient?  No      Pain Assessment   Currently in Pain?  No/denies    Multiple Pain Sites  No       Capillary Blood Glucose: No results found for this or any previous visit (from the past 24 hour(s)).    Social History   Tobacco Use  Smoking Status Former Smoker  . Packs/day: 1.00  . Years: 50.00  . Pack years: 50.00  . Types: Cigarettes  . Last attempt to quit: 08/19/2006  . Years since quitting: 11.2  Smokeless Tobacco Never Used    Goals Met:  Exercise tolerated well No report of cardiac concerns or symptoms Strength training completed today  Goals Unmet:  Not Applicable  Comments: Service time is from 1030 to 1210    [name] is Medical Director for Pulmonary Rehab at [facility].

## 2017-11-24 NOTE — Progress Notes (Signed)
Pulmonary Individual Treatment Plan  Patient Details  Name: Joshua [name] MRN: [id number] Date of Birth: 12-Sep-1935 Referring Provider:     Pulmonary Rehab Walk Test from 11/04/2017 in [place]  Referring Provider  [name]       Initial Encounter Date:    Pulmonary Rehab Walk Test from 11/04/2017 in [place]  Date  11/06/17  Referring Provider  [name]       Visit Diagnosis: Centrilobular emphysema ([place])  Patient's Home Medications on Admission:   Current Outpatient Medications:  .  albuterol (PROVENTIL HFA;VENTOLIN HFA) 108 (90 Base) MCG/ACT inhaler, Inhale 1-2 puffs into the lungs every 6 (six) hours as needed for wheezing or shortness of breath., Disp: 1 Inhaler, Rfl: 3 .  allopurinol (ZYLOPRIM) 100 MG tablet, Take 100 mg by mouth daily., Disp: , Rfl:  .  aspirin 81 MG tablet, Take 81 mg by mouth daily., Disp: , Rfl:  .  Cinnamon 500 MG capsule, Take 500 mg by mouth daily., Disp: , Rfl:  .  doxazosin (CARDURA) 4 MG tablet, Take 4 mg by mouth at bedtime. Patient takes in the pm, Disp: , Rfl:  .  furosemide (LASIX) 20 MG tablet, Take 20-40 mg by mouth daily. Take 20 mg everyday on Monday, Wednesday and Friday take 40 mg, Disp: , Rfl:  .  pantoprazole (PROTONIX) 40 MG tablet, Take 40 mg by mouth daily., Disp: , Rfl:  .  ramipril (ALTACE) 2.5 MG capsule, Take 2.5 mg by mouth daily., Disp: , Rfl:  .  Tiotropium Bromide Monohydrate (SPIRIVA RESPIMAT) 1.25 MCG/ACT AERS, Inhale 2 puffs into the lungs daily., Disp: 1 Inhaler, Rfl: 3  Past Medical History: Past Medical History:  Diagnosis Date  . Arthritis   . BPH (benign prostatic hyperplasia)   . COPD (chronic obstructive pulmonary disease) ([place])   . Folliculitis    posterior scalp per office visit note of Dr Maudie Mercury 07/20/2014    . GERD (gastroesophageal reflux disease)   . Hypertension   . PE (pulmonary thromboembolism) ([place])   . Phlebitis    right arm  at least  20 years ago   . Pneumonia    hx of pneumonia as a child     Tobacco Use: Social History   Tobacco Use  Smoking Status Former Smoker  . Packs/day: 1.00  . Years: 50.00  . Pack years: 50.00  . Types: Cigarettes  . Last attempt to quit: 08/19/2006  . Years since quitting: 11.2  Smokeless Tobacco Never Used    Labs: Recent Review Flowsheet Data    There is no flowsheet data to display.      Capillary Blood Glucose: Lab Results  Component Value Date   GLUCAP 123 (H) 11/24/2008     Pulmonary Assessment Scores: Pulmonary Assessment Scores    Row Name 11/04/17 1647 11/05/17 1224       ADL UCSD   ADL Phase  Entry  Entry    SOB Score total  -  27      CAT Score   CAT Score  -  11 Entry      mMRC Score   mMRC Score  1  -       Pulmonary Function Assessment: Pulmonary Function Assessment - 11/03/17 1032      Breath   Bilateral Breath Sounds  Clear;Decreased    Shortness of Breath  Yes       Exercise Target Goals:  Exercise Program Goal: Individual exercise prescription set using results from initial 6 min walk test and THRR while considering  patient's activity barriers and safety.    Exercise Prescription Goal: Initial exercise prescription builds to 30-45 minutes a day of aerobic activity, 2-3 days per week.  Home exercise guidelines will be given to patient during program as part of exercise prescription that the participant will acknowledge.  Activity Barriers & Risk Stratification: Activity Barriers & Cardiac Risk Stratification - 11/03/17 1013      Activity Barriers & Cardiac Risk Stratification   Activity Barriers  Shortness of Breath;Left Knee Replacement;History of Falls left knee replacement does not interfer with activity       6 Minute Walk: 6 Minute Walk    Row Name 11/06/17 0702         6 Minute Walk   Phase  Initial     Distance  1100 feet     Walk Time  6 minutes     # of Rest Breaks  0     MPH  2.08     METS  2.53     RPE   12     Perceived Dyspnea   3     Symptoms  No     Resting HR  95 bpm     Resting BP  104/80     Resting Oxygen Saturation   97 %     Exercise Oxygen Saturation  during 6 min walk  88 %     Max Ex. HR  130 bpm     Max Ex. BP  104/64       Interval HR   1 Minute HR  104     2 Minute HR  104     3 Minute HR  96     4 Minute HR  121     5 Minute HR  129     6 Minute HR  130     2 Minute Post HR  118     Interval Heart Rate?  Yes       Interval Oxygen   Interval Oxygen?  Yes     Baseline Oxygen Saturation %  97 %     1 Minute Oxygen Saturation %  93 %     1 Minute Liters of Oxygen  0 L     2 Minute Oxygen Saturation %  93 %     2 Minute Liters of Oxygen  0 L     3 Minute Oxygen Saturation %  89 %     3 Minute Liters of Oxygen  0 L     4 Minute Oxygen Saturation %  88 %     4 Minute Liters of Oxygen  0 L     5 Minute Oxygen Saturation %  88 %     5 Minute Liters of Oxygen  0 L     6 Minute Oxygen Saturation %  88 %     6 Minute Liters of Oxygen  0 L     2 Minute Post Oxygen Saturation %  88 %     2 Minute Post Liters of Oxygen  0 L        Oxygen Initial Assessment: Oxygen Initial Assessment - 11/04/17 1646      Initial 6 min Walk   Oxygen Used  None      Program Oxygen Prescription   Program Oxygen Prescription  None  Oxygen Re-Evaluation: Oxygen Re-Evaluation    Row Name 11/21/17 1344             Program Oxygen Prescription   Program Oxygen Prescription  None         Home Oxygen   Home Oxygen Device  None       Sleep Oxygen Prescription  None       Home Exercise Oxygen Prescription  None       Home at Rest Exercise Oxygen Prescription  None          Oxygen Discharge (Final Oxygen Re-Evaluation): Oxygen Re-Evaluation - 11/21/17 1344      Program Oxygen Prescription   Program Oxygen Prescription  None      Home Oxygen   Home Oxygen Device  None    Sleep Oxygen Prescription  None    Home Exercise Oxygen Prescription  None    Home at Rest  Exercise Oxygen Prescription  None       Initial Exercise Prescription: Initial Exercise Prescription - 11/06/17 0700      Date of Initial Exercise RX and Referring Provider   Date  11/06/17    Referring Provider  [name]       Bike   Level  0.6    Minutes  17      NuStep   Level  2    SPM  80    Minutes  17    METs  1.5      Track   Laps  10    Minutes  17      Prescription Details   Frequency (times per week)  2    Duration  Progress to 45 minutes of aerobic exercise without signs/symptoms of physical distress      Intensity   THRR 40-80% of Max Heartrate  56-113    Ratings of Perceived Exertion  11-13    Perceived Dyspnea  0-4      Progression   Progression  Continue progressive overload as per policy without signs/symptoms or physical distress.      Resistance Training   Training Prescription  Yes    Weight  blue bands    Reps  10-15       Perform Capillary Blood Glucose checks as needed.  Exercise Prescription Changes: Exercise Prescription Changes    Row Name 11/18/17 1200             Response to Exercise   Blood Pressure (Admit)  98/75       Blood Pressure (Exercise)  96/60       Blood Pressure (Exit)  106/69       Heart Rate (Admit)  85 bpm       Heart Rate (Exercise)  125 bpm       Heart Rate (Exit)  103 bpm       Oxygen Saturation (Admit)  98 %       Oxygen Saturation (Exercise)  89 %       Oxygen Saturation (Exit)  94 %       Rating of Perceived Exertion (Exercise)  13       Perceived Dyspnea (Exercise)  1       Duration  Progress to 45 minutes of aerobic exercise without signs/symptoms of physical distress       Intensity  THRR unchanged         Progression   Progression  Continue to progress workloads to maintain intensity without signs/symptoms of physical  distress.         Resistance Training   Training Prescription  Yes       Weight  blue bands       Reps  10-15         Bike   Level  0.6       Minutes  17         NuStep    Level  4       SPM  80       Minutes  17       METs  2.4         Track   Laps  14       Minutes  17          Exercise Comments:   Exercise Goals and Review: Exercise Goals    Row Name 11/03/17 1018             Exercise Goals   Increase Physical Activity  Yes       Intervention  Provide advice, education, support and counseling about physical activity/exercise needs.;Develop an individualized exercise prescription for aerobic and resistive training based on initial evaluation findings, risk stratification, comorbidities and participant's personal goals.       Expected Outcomes  Short Term: Attend rehab on a regular basis to increase amount of physical activity.;Long Term: Add in home exercise to make exercise part of routine and to increase amount of physical activity.;Long Term: Exercising regularly at least 3-5 days a week.       Increase Strength and Stamina  Yes       Intervention  Provide advice, education, support and counseling about physical activity/exercise needs.;Develop an individualized exercise prescription for aerobic and resistive training based on initial evaluation findings, risk stratification, comorbidities and participant's personal goals.       Expected Outcomes  Short Term: Increase workloads from initial exercise prescription for resistance, speed, and METs.;Short Term: Perform resistance training exercises routinely during rehab and add in resistance training at home;Long Term: Improve cardiorespiratory fitness, muscular endurance and strength as measured by increased METs and functional capacity (6MWT)       Able to understand and use rate of perceived exertion (RPE) scale  Yes       Intervention  Provide education and explanation on how to use RPE scale       Expected Outcomes  Short Term: Able to use RPE daily in rehab to express subjective intensity level;Long Term:  Able to use RPE to guide intensity level when exercising independently       Able to  understand and use Dyspnea scale  Yes       Intervention  Provide education and explanation on how to use Dyspnea scale       Expected Outcomes  Short Term: Able to use Dyspnea scale daily in rehab to express subjective sense of shortness of breath during exertion;Long Term: Able to use Dyspnea scale to guide intensity level when exercising independently       Knowledge and understanding of Target Heart Rate Range (THRR)  Yes       Intervention  Provide education and explanation of THRR including how the numbers were predicted and where they are located for reference       Expected Outcomes  Short Term: Able to state/look up THRR;Short Term: Able to use daily as guideline for intensity in rehab;Long Term: Able to use THRR to govern intensity when exercising independently  Understanding of Exercise Prescription  Yes       Intervention  Provide education, explanation, and written materials on patient's individual exercise prescription       Expected Outcomes  Short Term: Able to explain program exercise prescription;Long Term: Able to explain home exercise prescription to exercise independently          Exercise Goals Re-Evaluation : Exercise Goals Re-Evaluation    Row Name 11/21/17 1344             Exercise Goal Re-Evaluation   Exercise Goals Review  Increase Physical Activity;Able to understand and use rate of perceived exertion (RPE) scale;Knowledge and understanding of Target Heart Rate Range (THRR);Understanding of Exercise Prescription;Increase Strength and Stamina;Able to understand and use Dyspnea scale       Comments  The patient has only attended three rehab sessions. He has shown initiative and has had workload increase. Will cont. to monitor patients and progress as able.       Expected Outcomes  Through exercise at rehab and at home, patient will increase physical activity, strength, and stamina. Patient will also gain the confidence and knowledge to start an exercise regime at  home and adhere to it long term.           Discharge Exercise Prescription (Final Exercise Prescription Changes): Exercise Prescription Changes - 11/18/17 1200      Response to Exercise   Blood Pressure (Admit)  98/75    Blood Pressure (Exercise)  96/60    Blood Pressure (Exit)  106/69    Heart Rate (Admit)  85 bpm    Heart Rate (Exercise)  125 bpm    Heart Rate (Exit)  103 bpm    Oxygen Saturation (Admit)  98 %    Oxygen Saturation (Exercise)  89 %    Oxygen Saturation (Exit)  94 %    Rating of Perceived Exertion (Exercise)  13    Perceived Dyspnea (Exercise)  1    Duration  Progress to 45 minutes of aerobic exercise without signs/symptoms of physical distress    Intensity  THRR unchanged      Progression   Progression  Continue to progress workloads to maintain intensity without signs/symptoms of physical distress.      Resistance Training   Training Prescription  Yes    Weight  blue bands    Reps  10-15      Bike   Level  0.6    Minutes  17      NuStep   Level  4    SPM  80    Minutes  17    METs  2.4      Track   Laps  14    Minutes  17       Nutrition:  Target Goals: Understanding of nutrition guidelines, daily intake of sodium <1551m, cholesterol <2037m calories 30% from fat and 7% or less from saturated fats, daily to have 5 or more servings of fruits and vegetables.  Biometrics: Pre Biometrics - 11/03/17 1040      Pre Biometrics   Grip Strength  37 kg        Nutrition Therapy Plan and Nutrition Goals:   Nutrition Assessments:   Nutrition Goals Re-Evaluation:   Nutrition Goals Discharge (Final Nutrition Goals Re-Evaluation):   Psychosocial: Target Goals: Acknowledge presence or absence of significant depression and/or stress, maximize coping skills, provide positive support system. Participant is able to verbalize types and ability to use techniques and skills needed  for reducing stress and depression.  Initial Review & Psychosocial  Screening: Initial Psych Review & Screening - 11/03/17 1033      Initial Review   Current issues with  None Identified      Family Dynamics   Good Support System?  Yes      Barriers   Psychosocial barriers to participate in program  There are no identifiable barriers or psychosocial needs.      Screening Interventions   Interventions  Encouraged to exercise       Quality of Life Scores:  Scores of 19 and below usually indicate a poorer quality of life in these areas.  A difference of  2-3 points is a clinically meaningful difference.  A difference of 2-3 points in the total score of the Quality of Life Index has been associated with significant improvement in overall quality of life, self-image, physical symptoms, and general health in studies assessing change in quality of life.   PHQ-9: Recent Review Flowsheet Data    Depression screen [facility] 2/9 11/03/2017   Decreased Interest 0   Down, Depressed, Hopeless 0   PHQ - 2 Score 0     Interpretation of Total Score  Total Score Depression Severity:  1-4 = Minimal depression, 5-9 = Mild depression, 10-14 = Moderate depression, 15-19 = Moderately severe depression, 20-27 = Severe depression   Psychosocial Evaluation and Intervention: Psychosocial Evaluation - 11/03/17 1033      Psychosocial Evaluation & Interventions   Interventions  Encouraged to exercise with the program and follow exercise prescription    Expected Outcomes  patient will remain free from psychosocial barriers to participation in pulmonary rehab    Continue Psychosocial Services   No Follow up required       Psychosocial Re-Evaluation: Psychosocial Re-Evaluation    [place] Name 11/24/17 1023             Psychosocial Re-Evaluation   Current issues with  None Identified       Expected Outcomes  patient will remain free from psychosocial barriers to pulmonary rehab participation       Interventions  Encouraged to attend Pulmonary Rehabilitation for the  exercise       Continue Psychosocial Services   Follow up required by staff          Psychosocial Discharge (Final Psychosocial Re-Evaluation): Psychosocial Re-Evaluation - 11/24/17 1023      Psychosocial Re-Evaluation   Current issues with  None Identified    Expected Outcomes  patient will remain free from psychosocial barriers to pulmonary rehab participation    Interventions  Encouraged to attend Pulmonary Rehabilitation for the exercise    Continue Psychosocial Services   Follow up required by staff       Education: Education Goals: Education classes will be provided on a weekly basis, covering required topics. Participant will state understanding/return demonstration of topics presented.  Learning Barriers/Preferences: Learning Barriers/Preferences - 11/03/17 1032      Learning Barriers/Preferences   Learning Barriers  None    Learning Preferences  Verbal Instruction;Written Material;Group Instruction;Individual Instruction       Education Topics: Risk Factor Reduction:  -Group instruction that is supported by a PowerPoint presentation. Instructor discusses the definition of a risk factor, different risk factors for pulmonary disease, and how the heart and lungs work together.     Nutrition for Pulmonary Patient:  -Group instruction provided by PowerPoint slides, verbal discussion, and written materials to support subject matter. The instructor gives an explanation and  review of healthy diet recommendations, which includes a discussion on weight management, recommendations for fruit and vegetable consumption, as well as protein, fluid, caffeine, fiber, sodium, sugar, and alcohol. Tips for eating when patients are short of breath are discussed.   Pursed Lip Breathing:  -Group instruction that is supported by demonstration and informational handouts. Instructor discusses the benefits of pursed lip and diaphragmatic breathing and detailed demonstration on how to preform both.      Oxygen Safety:  -Group instruction provided by PowerPoint, verbal discussion, and written material to support subject matter. There is an overview of "What is Oxygen" and "Why do we need it".  Instructor also reviews how to create a safe environment for oxygen use, the importance of using oxygen as prescribed, and the risks of noncompliance. There is a brief discussion on traveling with oxygen and resources the patient may utilize.   Oxygen Equipment:  -Group instruction provided by [facility] utilizing handouts, written materials, and equipment demonstrations.   Signs and Symptoms:  -Group instruction provided by written material and verbal discussion to support subject matter. Warning signs and symptoms of infection, stroke, and heart attack are reviewed and when to call the physician/911 reinforced. Tips for preventing the spread of infection discussed.   Advanced Directives:  -Group instruction provided by verbal instruction and written material to support subject matter. Instructor reviews Advanced Directive laws and proper instruction for filling out document.   Pulmonary Video:  -Group video education that reviews the importance of medication and oxygen compliance, exercise, good nutrition, pulmonary hygiene, and pursed lip and diaphragmatic breathing for the pulmonary patient.   Exercise for the Pulmonary Patient:  -Group instruction that is supported by a PowerPoint presentation. Instructor discusses benefits of exercise, core components of exercise, frequency, duration, and intensity of an exercise routine, importance of utilizing pulse oximetry during exercise, safety while exercising, and options of places to exercise outside of rehab.     Pulmonary Medications:  -Verbally interactive group education provided by instructor with focus on inhaled medications and proper administration.   Anatomy and Physiology of the Respiratory System and Intimacy:  -Group  instruction provided by PowerPoint, verbal discussion, and written material to support subject matter. Instructor reviews respiratory cycle and anatomical components of the respiratory system and their functions. Instructor also reviews differences in obstructive and restrictive respiratory diseases with examples of each. Intimacy, Sex, and Sexuality differences are reviewed with a discussion on how relationships can change when diagnosed with pulmonary disease. Common sexual concerns are reviewed.   MD DAY -A group question and answer session with a medical doctor that allows participants to ask questions that relate to their pulmonary disease state.   OTHER EDUCATION -Group or individual verbal, written, or video instructions that support the educational goals of the pulmonary rehab program.   PULMONARY REHAB OTHER RESPIRATORY from 11/20/2017 in [place]  Date  11/20/17 [Beat a sedentary lifestyle]  Therapist, occupational  Instruction Review Code  1- Verbalizes Understanding      Holiday Eating Survival Tips:  -Group instruction provided by PowerPoint slides, verbal discussion, and written materials to support subject matter. The instructor gives patients tips, tricks, and techniques to help them not only survive but enjoy the holidays despite the onslaught of food that accompanies the holidays.   Knowledge Questionnaire Score: Knowledge Questionnaire Score - 11/05/17 1224      Knowledge Questionnaire Score   Pre Score  16/18       Core  Components/Risk Factors/Patient Goals at Admission: Personal Goals and Risk Factors at Admission - 11/03/17 1033      Core Components/Risk Factors/Patient Goals on Admission   Improve shortness of breath with ADL's  Yes    Intervention  Provide education, individualized exercise plan and daily activity instruction to help decrease symptoms of SOB with activities of daily living.    Expected Outcomes  Short Term: Improve  cardiorespiratory fitness to achieve a reduction of symptoms when performing ADLs;Long Term: Be able to perform more ADLs without symptoms or delay the onset of symptoms    Heart Failure  Yes    Intervention  Provide a combined exercise and nutrition program that is supplemented with education, support and counseling about heart failure. Directed toward relieving symptoms such as shortness of breath, decreased exercise tolerance, and extremity edema.    Expected Outcomes  Improve functional capacity of life;Short term: Attendance in program 2-3 days a week with increased exercise capacity. Reported lower sodium intake. Reported increased fruit and vegetable intake. Reports medication compliance.;Short term: Daily weights obtained and reported for increase. Utilizing diuretic protocols set by physician.;Long term: Adoption of self-care skills and reduction of barriers for early signs and symptoms recognition and intervention leading to self-care maintenance.       Core Components/Risk Factors/Patient Goals Review:  Goals and Risk Factor Review    Row Name 11/24/17 1019             Core Components/Risk Factors/Patient Goals Review   Personal Goals Review  Heart Failure;Improve shortness of breath with ADL's;Develop more efficient breathing techniques such as purse lipped breathing and diaphragmatic breathing and practicing self-pacing with activity.       Review  patient is doing well in pulmonary rehab. he has only attended 3 session since admission but in those 3 sesions it is evident that he is aware of HF self care. he makes comments such as "that is not what I weighed this morning" which leads me to believe he is weighing himself daily as expected of a HF patient. He is beginning to use PRB independently however he needs cueing most of the time. he states he is enjoying the program and has even reconnected to an old friend who is also attending pulmonary rehab. expect to see greater progression  towards pulmonary rehab goals over the next 30 days.       Expected Outcomes  see "admission expected outcomes"          Core Components/Risk Factors/Patient Goals at Discharge (Final Review):  Goals and Risk Factor Review - 11/24/17 1019      Core Components/Risk Factors/Patient Goals Review   Personal Goals Review  Heart Failure;Improve shortness of breath with ADL's;Develop more efficient breathing techniques such as purse lipped breathing and diaphragmatic breathing and practicing self-pacing with activity.    Review  patient is doing well in pulmonary rehab. he has only attended 3 session since admission but in those 3 sesions it is evident that he is aware of HF self care. he makes comments such as "that is not what I weighed this morning" which leads me to believe he is weighing himself daily as expected of a HF patient. He is beginning to use PRB independently however he needs cueing most of the time. he states he is enjoying the program and has even reconnected to an old friend who is also attending pulmonary rehab. expect to see greater progression towards pulmonary rehab goals over the next 30 days.  Expected Outcomes  see "admission expected outcomes"       ITP Comments:   Comments: patient has attended 3 sessions since admission

## 2017-11-25 ENCOUNTER — Encounter (HOSPITAL_COMMUNITY)
Admission: RE | Admit: 2017-11-25 | Discharge: 2017-11-25 | Disposition: A | Discharge status: Home / Self Care | Payer: Medicare Other | Source: Ambulatory Visit | Attending: Pulmonary Disease | Admitting: Pulmonary Disease

## 2017-11-25 DIAGNOSIS — J432 Centrilobular emphysema: Secondary | ICD-10-CM | POA: Diagnosis not present

## 2017-11-25 NOTE — Progress Notes (Signed)
Daily Session Note  Patient Details  Name: Joshua [name] MRN: [id number] Date of Birth: [birth year] Referring Provider:     Pulmonary Rehab Walk Test from 11/04/2017 in [place]  Referring Provider  [name]       Encounter Date: 11/25/2017  Check In: Session Check In - 11/25/17 1215      Check-In   Staff Present  Trish Fountain, RN, BSN;Molly diVincenzo, MS, ACSM RCEP, Exercise Physiologist;Lisa Ysidro Evert, RN    Supervising physician immediately available to respond to emergencies  [facility] immediately available    Physician(s)  Dr. Lonny Prude    Medication changes reported      No    Fall or balance concerns reported     No    Tobacco Cessation  No Change    Warm-up and Cool-down  Performed as group-led instruction    Resistance Training Performed  Yes    VAD Patient?  No      Pain Assessment   Currently in Pain?  No/denies    Multiple Pain Sites  No       Capillary Blood Glucose: No results found for this or any previous visit (from the past 24 hour(s)).    Social History   Tobacco Use  Smoking Status Former Smoker  . Packs/day: 1.00  . Years: 50.00  . Pack years: 50.00  . Types: Cigarettes  . Last attempt to quit: 08/19/2006  . Years since quitting: 11.2  Smokeless Tobacco Never Used    Goals Met:  Exercise tolerated well Queuing for purse lip breathing No report of cardiac concerns or symptoms Strength training completed today  Goals Unmet:  Not Applicable  Comments: Service time is from 1030 to 1210   [name] is Medical Director for Pulmonary Rehab at [facility].

## 2017-11-27 ENCOUNTER — Encounter (HOSPITAL_COMMUNITY)
Admission: RE | Admit: 2017-11-27 | Discharge: 2017-11-27 | Disposition: A | Discharge status: Home / Self Care | Payer: Medicare Other | Source: Ambulatory Visit | Attending: Pulmonary Disease | Admitting: Pulmonary Disease

## 2017-11-27 DIAGNOSIS — J432 Centrilobular emphysema: Secondary | ICD-10-CM

## 2017-11-27 NOTE — Progress Notes (Signed)
Daily Session Note  Patient Details  Name: Joshua [name] MRN: [id number] Date of Birth: 07-06-1936 Referring Provider:     Pulmonary Rehab Walk Test from 11/04/2017 in [place]  Referring Provider  [name]       Encounter Date: 11/27/2017  Check In: Session Check In - 11/27/17 1030      Check-In   Location  [facility]    Staff Present  Trish Fountain, RN, BSN;Molly diVincenzo, MS, ACSM RCEP, Exercise Physiologist;Carlette Wilber Oliphant, RN, BSN    Supervising physician immediately available to respond to emergencies  [facility] immediately available    Physician(s)  Dr. Alfredia Ferguson    Medication changes reported      No    Fall or balance concerns reported     No    Tobacco Cessation  No Change    Warm-up and Cool-down  Performed as group-led instruction    Resistance Training Performed  Yes    VAD Patient?  No      Pain Assessment   Currently in Pain?  No/denies    Multiple Pain Sites  No       Capillary Blood Glucose: No results found for this or any previous visit (from the past 24 hour(s)).    Social History   Tobacco Use  Smoking Status Former Smoker  . Packs/day: 1.00  . Years: 50.00  . Pack years: 50.00  . Types: Cigarettes  . Last attempt to quit: 08/19/2006  . Years since quitting: 11.2  Smokeless Tobacco Never Used    Goals Met:  Exercise tolerated well No report of cardiac concerns or symptoms Strength training completed today  Goals Unmet:  Not Applicable  Comments: Service time is from 10:30a to 12:30p    [name] is Medical Director for Pulmonary Rehab at [facility].

## 2017-11-28 ENCOUNTER — Other Ambulatory Visit: Payer: Self-pay | Admitting: Acute Care

## 2017-12-01 ENCOUNTER — Other Ambulatory Visit: Payer: Self-pay

## 2017-12-01 MED ORDER — ALBUTEROL SULFATE HFA 108 (90 BASE) MCG/ACT IN AERS: 1.0000 | INHALATION_SPRAY | Freq: Four times a day (QID) | RESPIRATORY_TRACT | 3 refills | Status: DC | PRN

## 2017-12-02 ENCOUNTER — Encounter (HOSPITAL_COMMUNITY)
Admission: RE | Admit: 2017-12-02 | Discharge: 2017-12-02 | Disposition: A | Discharge status: Home / Self Care | Payer: Medicare Other | Source: Ambulatory Visit | Attending: Pulmonary Disease | Admitting: Pulmonary Disease

## 2017-12-02 VITALS — Wt 250.4 lb

## 2017-12-02 DIAGNOSIS — J432 Centrilobular emphysema: Secondary | ICD-10-CM

## 2017-12-02 NOTE — Progress Notes (Signed)
Daily Session Note  Patient Details  Name: Joshua [name] MRN: [id number] Date of Birth: 17-Feb-1936 Referring Provider:     Pulmonary Rehab Walk Test from 11/04/2017 in [place]  Referring Provider  [name]       Encounter Date: 12/02/2017  Check In: Session Check In - 12/02/17 1223      Check-In   Location  [facility]    Staff Present  Su Hilt, MS, ACSM RCEP, Exercise Physiologist;Lisa Ysidro Evert, RN;Carlette Wilber Oliphant, RN, Luisa Hart, RN, BSN    Supervising physician immediately available to respond to emergencies  [facility] immediately available    Physician(s)  Dr. Alfredia Ferguson    Medication changes reported      No    Fall or balance concerns reported     No    Tobacco Cessation  No Change    Warm-up and Cool-down  Performed as group-led instruction    Resistance Training Performed  Yes    VAD Patient?  No      Pain Assessment   Currently in Pain?  No/denies    Multiple Pain Sites  No       Capillary Blood Glucose: No results found for this or any previous visit (from the past 24 hour(s)).  Exercise Prescription Changes - 12/02/17 1200      Response to Exercise   Blood Pressure (Admit)  110/62    Blood Pressure (Exercise)  102/54    Blood Pressure (Exit)  100/60    Heart Rate (Admit)  77 bpm    Heart Rate (Exercise)  104 bpm    Heart Rate (Exit)  88 bpm    Oxygen Saturation (Admit)  100 %    Oxygen Saturation (Exercise)  89 %    Oxygen Saturation (Exit)  95 %    Rating of Perceived Exertion (Exercise)  11    Perceived Dyspnea (Exercise)  1    Duration  Progress to 45 minutes of aerobic exercise without signs/symptoms of physical distress    Intensity  THRR unchanged      Progression   Progression  Continue to progress workloads to maintain intensity without signs/symptoms of physical distress.      Resistance Training   Training Prescription  Yes    Weight  blue bands    Reps  10-15      Bike   Level  1.3    Minutes  17      NuStep   Level  5    SPM  80    Minutes  17    METs  2.1      Track   Laps  10    Minutes  17       Social History   Tobacco Use  Smoking Status Former Smoker  . Packs/day: 1.00  . Years: 50.00  . Pack years: 50.00  . Types: Cigarettes  . Last attempt to quit: 08/19/2006  . Years since quitting: 11.2  Smokeless Tobacco Never Used    Goals Met:  Exercise tolerated well No report of cardiac concerns or symptoms Strength training completed today  Goals Unmet:  Not Applicable  Comments: Service time is from 10:30a to 12:10p    [name] is Medical Director for Pulmonary Rehab at [facility].

## 2017-12-02 NOTE — Progress Notes (Signed)
Joshua [name] 82 y.o. male   DOB: 1936/07/28 MRN: [id number]          Nutrition Dx: Centrilobular Emphysema  Past Medical History:  Diagnosis Date  . Arthritis   . BPH (benign prostatic hyperplasia)   . COPD (chronic obstructive pulmonary disease) ([place])   . Folliculitis    posterior scalp per office visit note of Dr Maudie Mercury 07/20/2014    . GERD (gastroesophageal reflux disease)   . Hypertension   . PE (pulmonary thromboembolism) ([place])   . Phlebitis    right arm  at least 20 years ago   . Pneumonia    hx of pneumonia as a child    Meds reviewed.   Ht: Ht Readings from Last 1 Encounters:  11/03/17 6' 1.25" (1.861 m)    Wt:  Wt Readings from Last 3 Encounters:  11/18/17 252 lb 10.4 oz (114.6 kg)  11/03/17 255 lb 8.2 oz (115.9 kg)  09/29/17 250 lb 9.6 oz (113.7 kg)    BMI: 33.1    Current tobacco use? No  Labs:  Lipid Panel  No results found for: CHOL, TRIG, HDL, CHOLHDL, VLDL, LDLCALC, LDLDIRECT  No results found for: HGBA1C  Note Spoke with pt. Pt is obese. There are some ways the pt can make his eating habits healthier. Pt's Rate Your Plate results reviewed with pt. Pt is trying to avoid salty foods. Pt adds No salt to food. Use of salt substitutes, like No salt, discussed.Pt expressed understanding of the information reviewed.  Nutrition Diagnosis ? Food-and nutrition-related knowledge deficit related to lack of exposure to information as related to diagnosis of pulmonary disease ? Obesity related to excessive energy intake as evidenced by a BMI of 33.1  Nutrition Intervention ? Pt's individual nutrition plan and goals reviewed with pt. ? Benefits of adopting healthy eating habits discussed when pt's Rate Your Plate reviewed.  Goal(s) 1. Pt to stop using No-Salt or Nu-Salt and consider using salt sparingly. 2. Pt to increase his vegetable consumption by adding a vegetable to lunch and/or dinner at least 3 days/week.   Plan:  Pt to attend Pulmonary Nutrition  class Will provide client-centered nutrition education as part of interdisciplinary care.   Monitor and evaluate progress toward nutrition goal with team.  Monitor and Evaluate progress toward nutrition goal with team.   Derek Mound, M.Ed, RD, LDN, CDE 12/02/2017 11:56 AM

## 2017-12-04 ENCOUNTER — Encounter (HOSPITAL_COMMUNITY)
Admission: RE | Admit: 2017-12-04 | Discharge: 2017-12-04 | Disposition: A | Discharge status: Home / Self Care | Payer: Medicare Other | Source: Ambulatory Visit | Attending: Pulmonary Disease | Admitting: Pulmonary Disease

## 2017-12-04 DIAGNOSIS — J432 Centrilobular emphysema: Secondary | ICD-10-CM | POA: Diagnosis not present

## 2017-12-04 NOTE — Progress Notes (Signed)
I have reviewed a Home Exercise Prescription with [name] . Sinan is not currently exercising at home.  The patient was advised to walk 2-3 days a week for 30 minutes.  Amaurie and I discussed how to progress their exercise prescription.  The patient stated that their goals were to stay off of oxygen and lose weight.  The patient stated that they understand the exercise prescription.  We reviewed exercise guidelines, target heart rate during exercise, oxygen use, weather, home pulse oximeter, endpoints for exercise, and goals.  Patient is encouraged to come to me with any questions. I will continue to follow up with the patient to assist them with progression and safety.

## 2017-12-04 NOTE — Progress Notes (Signed)
Daily Session Note  Patient Details  Name: Joshua [name] MRN: [id number] Date of Birth: 1936/02/04 Referring Provider:     Pulmonary Rehab Walk Test from 11/04/2017 in [place]  Referring Provider  [name]       Encounter Date: 12/04/2017  Check In: Session Check In - 12/04/17 1024      Check-In   Location  [facility]    Staff Present  Su Hilt, MS, ACSM RCEP, Exercise Physiologist;Portia Rollene Rotunda, RN, Roque Cash, RN    Supervising physician immediately available to respond to emergencies  [facility] immediately available    Physician(s)  Dr. Cruzita Lederer    Medication changes reported      No    Fall or balance concerns reported     No    Tobacco Cessation  No Change    Warm-up and Cool-down  Performed as group-led instruction    Resistance Training Performed  Yes    VAD Patient?  No      Pain Assessment   Currently in Pain?  No/denies    Multiple Pain Sites  No       Capillary Blood Glucose: No results found for this or any previous visit (from the past 24 hour(s)).    Social History   Tobacco Use  Smoking Status Former Smoker  . Packs/day: 1.00  . Years: 50.00  . Pack years: 50.00  . Types: Cigarettes  . Last attempt to quit: 08/19/2006  . Years since quitting: 11.3  Smokeless Tobacco Never Used    Goals Met:  Exercise tolerated well No report of cardiac concerns or symptoms Strength training completed today  Goals Unmet:  Not Applicable  Comments: Service time is from 1030 to 1225    [name] is Medical Director for Pulmonary Rehab at [facility].

## 2017-12-09 ENCOUNTER — Encounter (HOSPITAL_COMMUNITY): Payer: Medicare Other

## 2017-12-11 ENCOUNTER — Encounter (HOSPITAL_COMMUNITY)
Admission: RE | Admit: 2017-12-11 | Discharge: 2017-12-11 | Disposition: A | Discharge status: Home / Self Care | Payer: Medicare Other | Source: Ambulatory Visit | Attending: Pulmonary Disease | Admitting: Pulmonary Disease

## 2017-12-11 DIAGNOSIS — J432 Centrilobular emphysema: Secondary | ICD-10-CM | POA: Diagnosis not present

## 2017-12-11 NOTE — Progress Notes (Signed)
Daily Session Note  Patient Details  Name: CHIEF WALKUP MRN: [id number] Date of Birth: 1936/01/21 Referring Provider:     Pulmonary Rehab Walk Test from 11/04/2017 in [place]  Referring Provider  [name]       Encounter Date: 12/11/2017  Check In: Session Check In - 12/11/17 1634      Check-In   Location  [facility]       Capillary Blood Glucose: No results found for this or any previous visit (from the past 24 hour(s)).    Social History   Tobacco Use  Smoking Status Former Smoker  . Packs/day: 1.00  . Years: 50.00  . Pack years: 50.00  . Types: Cigarettes  . Last attempt to quit: 08/19/2006  . Years since quitting: 11.3  Smokeless Tobacco Never Used    Goals Met:  Exercise tolerated well No report of cardiac concerns or symptoms Strength training completed today  Goals Unmet:  Not Applicable  Comments: Service time is from 11:15a to 13:15p    [name] is Medical Director for Pulmonary Rehab at [facility].

## 2017-12-16 ENCOUNTER — Encounter (HOSPITAL_COMMUNITY)
Admission: RE | Admit: 2017-12-16 | Discharge: 2017-12-16 | Disposition: A | Discharge status: Home / Self Care | Payer: Medicare Other | Source: Ambulatory Visit | Attending: Pulmonary Disease | Admitting: Pulmonary Disease

## 2017-12-16 VITALS — Wt 250.7 lb

## 2017-12-16 DIAGNOSIS — J432 Centrilobular emphysema: Secondary | ICD-10-CM

## 2017-12-16 NOTE — Progress Notes (Signed)
Daily Session Note  Patient Details  Name: Joshua [name] MRN: [id number] Date of Birth: 04/16/1936 Referring Provider:     Pulmonary Rehab Walk Test from 11/04/2017 in [place]  Referring Provider  [name]       Encounter Date: 12/16/2017  Check In: Session Check In - 12/16/17 1021      Check-In   Location  [facility]    Staff Present  Su Hilt, MS, ACSM RCEP, Exercise Physiologist;Lisa Ysidro Evert, RN;Portia Rollene Rotunda, RN, BSN;Carlette Wilber Oliphant, RN, BSN    Supervising physician immediately available to respond to emergencies  [facility] immediately available    Physician(s)  Dr. Maylene Roes    Medication changes reported      No    Fall or balance concerns reported     No    Tobacco Cessation  No Change    Warm-up and Cool-down  Performed as group-led instruction    Resistance Training Performed  Yes    VAD Patient?  No      Pain Assessment   Currently in Pain?  No/denies    Multiple Pain Sites  No       Capillary Blood Glucose: No results found for this or any previous visit (from the past 24 hour(s)).  Exercise Prescription Changes - 12/16/17 1200      Response to Exercise   Blood Pressure (Admit)  100/60    Blood Pressure (Exercise)  98/62    Blood Pressure (Exit)  118/72    Heart Rate (Admit)  76 bpm    Heart Rate (Exercise)  128 bpm    Heart Rate (Exit)  97 bpm    Oxygen Saturation (Admit)  99 %    Oxygen Saturation (Exercise)  89 %    Oxygen Saturation (Exit)  95 %    Rating of Perceived Exertion (Exercise)  12    Perceived Dyspnea (Exercise)  2    Duration  Progress to 45 minutes of aerobic exercise without signs/symptoms of physical distress    Intensity  THRR unchanged      Progression   Progression  Continue to progress workloads to maintain intensity without signs/symptoms of physical distress.      Resistance Training   Training Prescription  Yes    Weight  blue bands    Reps  10-15    Time  10  Minutes      Bike   Level  1.3    Minutes  17      NuStep   Level  6    SPM  80    Minutes  17    METs  2.6      Track   Laps  15    Minutes  17       Social History   Tobacco Use  Smoking Status Former Smoker  . Packs/day: 1.00  . Years: 50.00  . Pack years: 50.00  . Types: Cigarettes  . Last attempt to quit: 08/19/2006  . Years since quitting: 11.3  Smokeless Tobacco Never Used    Goals Met:  Exercise tolerated well No report of cardiac concerns or symptoms Strength training completed today  Goals Unmet:  Not Applicable  Comments: Service time is from 1030 to 1200    [name] is Medical Director for Pulmonary Rehab at [facility].

## 2017-12-18 ENCOUNTER — Encounter (HOSPITAL_COMMUNITY)
Admission: RE | Admit: 2017-12-18 | Discharge: 2017-12-18 | Disposition: A | Discharge status: Home / Self Care | Payer: Medicare Other | Source: Ambulatory Visit | Attending: Pulmonary Disease | Admitting: Pulmonary Disease

## 2017-12-18 DIAGNOSIS — J432 Centrilobular emphysema: Secondary | ICD-10-CM | POA: Insufficient documentation

## 2017-12-18 NOTE — Progress Notes (Signed)
Daily Session Note  Patient Details  Name: Joshua [name] MRN: [id number] Date of Birth: 06-Jul-1936 Referring Provider:     Pulmonary Rehab Walk Test from 11/04/2017 in [place]  Referring Provider  [name]       Encounter Date: 12/18/2017  Check In: Session Check In - 12/18/17 1058      Check-In   Location  [facility]    Staff Present  Cloyde Reams DiVincenzo, MS, ACSM RCEP, Exercise Physiologist;Lisa Ysidro Evert, RN;Portia Rollene Rotunda, RN, BSN;Other    Supervising physician immediately available to respond to emergencies  [facility] immediately available    Physician(s)  Dr. Reesa Chew    Medication changes reported      No    Fall or balance concerns reported     No    Tobacco Cessation  No Change    Warm-up and Cool-down  Performed as group-led instruction    Resistance Training Performed  Yes    VAD Patient?  No      Pain Assessment   Currently in Pain?  No/denies    Multiple Pain Sites  No       Capillary Blood Glucose: No results found for this or any previous visit (from the past 24 hour(s)).    Social History   Tobacco Use  Smoking Status Former Smoker  . Packs/day: 1.00  . Years: 50.00  . Pack years: 50.00  . Types: Cigarettes  . Last attempt to quit: 08/19/2006  . Years since quitting: 11.3  Smokeless Tobacco Never Used    Goals Met:  Exercise tolerated well No report of cardiac concerns or symptoms Strength training completed today  Goals Unmet:  Not Applicable  Comments: Service time is from 1030 to 1230    [name] is Medical Director for Pulmonary Rehab at [facility].

## 2017-12-23 ENCOUNTER — Encounter (HOSPITAL_COMMUNITY)
Admission: RE | Admit: 2017-12-23 | Discharge: 2017-12-23 | Disposition: A | Discharge status: Home / Self Care | Payer: Medicare Other | Source: Ambulatory Visit | Attending: Pulmonary Disease | Admitting: Pulmonary Disease

## 2017-12-23 DIAGNOSIS — J432 Centrilobular emphysema: Secondary | ICD-10-CM

## 2017-12-23 NOTE — Progress Notes (Signed)
Daily Session Note  Patient Details  Name: Joshua [name] MRN: [id number] Date of Birth: 1935/11/08 Referring Provider:     Pulmonary Rehab Walk Test from 11/04/2017 in [place]  Referring Provider  [name]       Encounter Date: 12/23/2017  Check In: Session Check In - 12/23/17 1211      Check-In   Location  [facility]    Staff Present  Su Hilt, MS, ACSM RCEP, Exercise Physiologist;Lisa Ysidro Evert, RN;Portia Rollene Rotunda, RN, BSN;Carlette Wilber Oliphant, RN, BSN    Supervising physician immediately available to respond to emergencies  [facility] immediately available    Physician(s)  Dr. Reesa Chew    Medication changes reported      No    Fall or balance concerns reported     No    Tobacco Cessation  No Change    Warm-up and Cool-down  Performed as group-led instruction    Resistance Training Performed  Yes    VAD Patient?  No      Pain Assessment   Currently in Pain?  No/denies    Multiple Pain Sites  No       Capillary Blood Glucose: No results found for this or any previous visit (from the past 24 hour(s)).    Social History   Tobacco Use  Smoking Status Former Smoker  . Packs/day: 1.00  . Years: 50.00  . Pack years: 50.00  . Types: Cigarettes  . Last attempt to quit: 08/19/2006  . Years since quitting: 11.3  Smokeless Tobacco Never Used    Goals Met:  Exercise tolerated well No report of cardiac concerns or symptoms Strength training completed today  Goals Unmet:  Not Applicable  Comments: Service time is from 1030 to 1200    [name] is Medical Director for Pulmonary Rehab at [facility].

## 2017-12-25 ENCOUNTER — Encounter (HOSPITAL_COMMUNITY)
Admission: RE | Admit: 2017-12-25 | Discharge: 2017-12-25 | Disposition: A | Discharge status: Home / Self Care | Payer: Medicare Other | Source: Ambulatory Visit | Attending: Pulmonary Disease | Admitting: Pulmonary Disease

## 2017-12-25 DIAGNOSIS — J432 Centrilobular emphysema: Secondary | ICD-10-CM | POA: Diagnosis not present

## 2017-12-25 NOTE — Progress Notes (Signed)
Daily Session Note  Patient Details  Name: TIMMOTHY BARANOWSKI MRN: [id number] Date of Birth: 12-Jun-1936 Referring Provider:     Pulmonary Rehab Walk Test from 11/04/2017 in [place]  Referring Provider  [name]       Encounter Date: 12/25/2017  Check In: Session Check In - 12/25/17 1030      Check-In   Location  [facility]    Staff Present  Cloyde Reams DiVincenzo, MS, ACSM RCEP, Exercise Physiologist;Lisa Ysidro Evert, RN;Portia Rollene Rotunda, RN, BSN;Carlette Wilber Oliphant, RN, BSN    Supervising physician immediately available to respond to emergencies  [facility] immediately available    Physician(s)  Dr. Tawanna Solo    Medication changes reported      No    Fall or balance concerns reported     No    Tobacco Cessation  No Change    Warm-up and Cool-down  Performed as group-led instruction    Resistance Training Performed  Yes    VAD Patient?  No      Pain Assessment   Currently in Pain?  No/denies    Multiple Pain Sites  No       Capillary Blood Glucose: No results found for this or any previous visit (from the past 24 hour(s)).    Social History   Tobacco Use  Smoking Status Former Smoker  . Packs/day: 1.00  . Years: 50.00  . Pack years: 50.00  . Types: Cigarettes  . Last attempt to quit: 08/19/2006  . Years since quitting: 11.3  Smokeless Tobacco Never Used    Goals Met:  Independence with exercise equipment Improved SOB with ADL's Using PLB without cueing & demonstrates good technique Exercise tolerated well No report of cardiac concerns or symptoms Strength training completed today  Goals Unmet:  Not Applicable  Comments: Service time is from 1030 to 1210   [name] is Medical Director for Pulmonary Rehab at [facility].

## 2017-12-25 NOTE — Progress Notes (Signed)
Daily Session Note  Patient Details  Name: Joshua [name] MRN: [id number] Date of Birth: [birth year] Referring Provider:     Pulmonary Rehab Walk Test from 11/04/2017 in [place]  Referring Provider  [name]       Encounter Date: 12/25/2017  Check In: Session Check In - 12/25/17 1030      Check-In   Location  [facility]    Staff Present  Cloyde Reams DiVincenzo, MS, ACSM RCEP, Exercise Physiologist;Lisa Ysidro Evert, RN;Portia Rollene Rotunda, RN, BSN;Carlette Wilber Oliphant, RN, BSN    Supervising physician immediately available to respond to emergencies  [facility] immediately available    Physician(s)  Dr. Tawanna Solo    Medication changes reported      No    Fall or balance concerns reported     No    Tobacco Cessation  No Change    Warm-up and Cool-down  Performed as group-led instruction    Resistance Training Performed  Yes    VAD Patient?  No      Pain Assessment   Currently in Pain?  No/denies    Multiple Pain Sites  No       Capillary Blood Glucose: No results found for this or any previous visit (from the past 24 hour(s)).    Social History   Tobacco Use  Smoking Status Former Smoker  . Packs/day: 1.00  . Years: 50.00  . Pack years: 50.00  . Types: Cigarettes  . Last attempt to quit: 08/19/2006  . Years since quitting: 11.3  Smokeless Tobacco Never Used    Goals Met:  Exercise tolerated well No report of cardiac concerns or symptoms Strength training completed today  Goals Unmet:  Not Applicable  Comments: Service time is from 1030 to 1210    [name] is Medical Director for Pulmonary Rehab at [facility].

## 2017-12-25 NOTE — Progress Notes (Signed)
Pulmonary Individual Treatment Plan  Patient Details  Name: Joshua [name] MRN: [id number] Date of Birth: 1936-04-07 Referring Provider:     Pulmonary Rehab Walk Test from 11/04/2017 in [place]  Referring Provider  [name]       Initial Encounter Date:    Pulmonary Rehab Walk Test from 11/04/2017 in [place]  Date  11/06/17  Referring Provider  [name]       Visit Diagnosis: Centrilobular emphysema ([place])  Patient's Home Medications on Admission:   Current Outpatient Medications:  .  albuterol (PROVENTIL HFA;VENTOLIN HFA) 108 (90 Base) MCG/ACT inhaler, Inhale 1-2 puffs into the lungs every 6 (six) hours as needed for wheezing or shortness of breath., Disp: 1 Inhaler, Rfl: 3 .  allopurinol (ZYLOPRIM) 100 MG tablet, Take 100 mg by mouth daily., Disp: , Rfl:  .  aspirin 81 MG tablet, Take 81 mg by mouth daily., Disp: , Rfl:  .  Cinnamon 500 MG capsule, Take 500 mg by mouth daily., Disp: , Rfl:  .  doxazosin (CARDURA) 4 MG tablet, Take 4 mg by mouth at bedtime. Patient takes in the pm, Disp: , Rfl:  .  furosemide (LASIX) 20 MG tablet, Take 20-40 mg by mouth daily. Take 20 mg everyday on Monday, Wednesday and Friday take 40 mg, Disp: , Rfl:  .  pantoprazole (PROTONIX) 40 MG tablet, Take 40 mg by mouth daily., Disp: , Rfl:  .  ramipril (ALTACE) 2.5 MG capsule, Take 2.5 mg by mouth daily., Disp: , Rfl:  .  Tiotropium Bromide Monohydrate (SPIRIVA RESPIMAT) 1.25 MCG/ACT AERS, Inhale 2 puffs into the lungs daily., Disp: 1 Inhaler, Rfl: 3  Past Medical History: Past Medical History:  Diagnosis Date  . Arthritis   . BPH (benign prostatic hyperplasia)   . COPD (chronic obstructive pulmonary disease) ([place])   . Folliculitis    posterior scalp per office visit note of Dr Maudie Mercury 07/20/2014    . GERD (gastroesophageal reflux disease)   . Hypertension   . PE (pulmonary thromboembolism) ([place])   . Phlebitis    right arm  at least  20 years ago   . Pneumonia    hx of pneumonia as a child     Tobacco Use: Social History   Tobacco Use  Smoking Status Former Smoker  . Packs/day: 1.00  . Years: 50.00  . Pack years: 50.00  . Types: Cigarettes  . Last attempt to quit: 08/19/2006  . Years since quitting: 11.3  Smokeless Tobacco Never Used    Labs: Recent Review Flowsheet Data    There is no flowsheet data to display.      Capillary Blood Glucose: Lab Results  Component Value Date   GLUCAP 123 (H) 11/24/2008     Pulmonary Assessment Scores: Pulmonary Assessment Scores    Row Name 11/04/17 1647 11/05/17 1224       ADL UCSD   ADL Phase  Entry  Entry    SOB Score total  -  27      CAT Score   CAT Score  -  11 Entry      mMRC Score   mMRC Score  1  -       Pulmonary Function Assessment: Pulmonary Function Assessment - 11/03/17 1032      Breath   Bilateral Breath Sounds  Clear;Decreased    Shortness of Breath  Yes       Exercise Target Goals:  Exercise Program Goal: Individual exercise prescription set using results from initial 6 min walk test and THRR while considering  patient's activity barriers and safety.    Exercise Prescription Goal: Initial exercise prescription builds to 30-45 minutes a day of aerobic activity, 2-3 days per week.  Home exercise guidelines will be given to patient during program as part of exercise prescription that the participant will acknowledge.  Activity Barriers & Risk Stratification: Activity Barriers & Cardiac Risk Stratification - 11/03/17 1013      Activity Barriers & Cardiac Risk Stratification   Activity Barriers  Shortness of Breath;Left Knee Replacement;History of Falls left knee replacement does not interfer with activity       6 Minute Walk: 6 Minute Walk    Row Name 11/06/17 0702         6 Minute Walk   Phase  Initial     Distance  1100 feet     Walk Time  6 minutes     # of Rest Breaks  0     MPH  2.08     METS  2.53     RPE   12     Perceived Dyspnea   3     Symptoms  No     Resting HR  95 bpm     Resting BP  104/80     Resting Oxygen Saturation   97 %     Exercise Oxygen Saturation  during 6 min walk  88 %     Max Ex. HR  130 bpm     Max Ex. BP  104/64       Interval HR   1 Minute HR  104     2 Minute HR  104     3 Minute HR  96     4 Minute HR  121     5 Minute HR  129     6 Minute HR  130     2 Minute Post HR  118     Interval Heart Rate?  Yes       Interval Oxygen   Interval Oxygen?  Yes     Baseline Oxygen Saturation %  97 %     1 Minute Oxygen Saturation %  93 %     1 Minute Liters of Oxygen  0 L     2 Minute Oxygen Saturation %  93 %     2 Minute Liters of Oxygen  0 L     3 Minute Oxygen Saturation %  89 %     3 Minute Liters of Oxygen  0 L     4 Minute Oxygen Saturation %  88 %     4 Minute Liters of Oxygen  0 L     5 Minute Oxygen Saturation %  88 %     5 Minute Liters of Oxygen  0 L     6 Minute Oxygen Saturation %  88 %     6 Minute Liters of Oxygen  0 L     2 Minute Post Oxygen Saturation %  88 %     2 Minute Post Liters of Oxygen  0 L        Oxygen Initial Assessment: Oxygen Initial Assessment - 11/04/17 1646      Initial 6 min Walk   Oxygen Used  None      Program Oxygen Prescription   Program Oxygen Prescription  None  Oxygen Re-Evaluation: Oxygen Re-Evaluation    Row Name 11/21/17 1344 12/23/17 0711           Program Oxygen Prescription   Program Oxygen Prescription  None  None        Home Oxygen   Home Oxygen Device  None  None      Sleep Oxygen Prescription  None  None      Home Exercise Oxygen Prescription  None  None      Home at Rest Exercise Oxygen Prescription  None  None         Oxygen Discharge (Final Oxygen Re-Evaluation): Oxygen Re-Evaluation - 12/23/17 0711      Program Oxygen Prescription   Program Oxygen Prescription  None      Home Oxygen   Home Oxygen Device  None    Sleep Oxygen Prescription  None    Home Exercise Oxygen  Prescription  None    Home at Rest Exercise Oxygen Prescription  None       Initial Exercise Prescription: Initial Exercise Prescription - 11/06/17 0700      Date of Initial Exercise RX and Referring Provider   Date  11/06/17    Referring Provider  [name]       Bike   Level  0.6    Minutes  17      NuStep   Level  2    SPM  80    Minutes  17    METs  1.5      Track   Laps  10    Minutes  17      Prescription Details   Frequency (times per week)  2    Duration  Progress to 45 minutes of aerobic exercise without signs/symptoms of physical distress      Intensity   THRR 40-80% of Max Heartrate  56-113    Ratings of Perceived Exertion  11-13    Perceived Dyspnea  0-4      Progression   Progression  Continue progressive overload as per policy without signs/symptoms or physical distress.      Resistance Training   Training Prescription  Yes    Weight  blue bands    Reps  10-15       Perform Capillary Blood Glucose checks as needed.  Exercise Prescription Changes: Exercise Prescription Changes    Row Name 11/18/17 1200 12/02/17 1200 12/04/17 1600 12/16/17 1200       Response to Exercise   Blood Pressure (Admit)  98/75  110/62  -  100/60    Blood Pressure (Exercise)  96/60  102/54  -  98/62    Blood Pressure (Exit)  106/69  100/60  -  118/72    Heart Rate (Admit)  85 bpm  77 bpm  -  76 bpm    Heart Rate (Exercise)  125 bpm  104 bpm  -  128 bpm    Heart Rate (Exit)  103 bpm  88 bpm  -  97 bpm    Oxygen Saturation (Admit)  98 %  100 %  -  99 %    Oxygen Saturation (Exercise)  89 %  89 %  -  89 %    Oxygen Saturation (Exit)  94 %  95 %  -  95 %    Rating of Perceived Exertion (Exercise)  13  11  -  12    Perceived Dyspnea (Exercise)  1  1  -  2  Duration  Progress to 45 minutes of aerobic exercise without signs/symptoms of physical distress  Progress to 45 minutes of aerobic exercise without signs/symptoms of physical distress  -  Progress to 45 minutes of  aerobic exercise without signs/symptoms of physical distress    Intensity  THRR unchanged  THRR unchanged  -  THRR unchanged      Progression   Progression  Continue to progress workloads to maintain intensity without signs/symptoms of physical distress.  Continue to progress workloads to maintain intensity without signs/symptoms of physical distress.  -  Continue to progress workloads to maintain intensity without signs/symptoms of physical distress.      Resistance Training   Training Prescription  Yes  Yes  -  Yes    Weight  blue bands  blue bands  -  blue bands    Reps  10-15  10-15  -  10-15    Time  -  -  -  10 Minutes      Bike   Level  0.6  1.3  -  1.3    Minutes  17  17  -  17      NuStep   Level  4  5  -  6    SPM  80  80  -  80    Minutes  17  17  -  17    METs  2.4  2.1  -  2.6      Track   Laps  14  10  -  15    Minutes  17  17  -  17      Home Exercise Plan   Plans to continue exercise at  -  -  Home (comment)  -    Frequency  -  -  Add 2 additional days to program exercise sessions.  -       Exercise Comments: Exercise Comments    Row Name 12/04/17 1645           Exercise Comments  Home exercise completed          Exercise Goals and Review: Exercise Goals    Row Name 11/03/17 1018             Exercise Goals   Increase Physical Activity  Yes       Intervention  Provide advice, education, support and counseling about physical activity/exercise needs.;Develop an individualized exercise prescription for aerobic and resistive training based on initial evaluation findings, risk stratification, comorbidities and participant's personal goals.       Expected Outcomes  Short Term: Attend rehab on a regular basis to increase amount of physical activity.;Long Term: Add in home exercise to make exercise part of routine and to increase amount of physical activity.;Long Term: Exercising regularly at least 3-5 days a week.       Increase Strength and Stamina  Yes        Intervention  Provide advice, education, support and counseling about physical activity/exercise needs.;Develop an individualized exercise prescription for aerobic and resistive training based on initial evaluation findings, risk stratification, comorbidities and participant's personal goals.       Expected Outcomes  Short Term: Increase workloads from initial exercise prescription for resistance, speed, and METs.;Short Term: Perform resistance training exercises routinely during rehab and add in resistance training at home;Long Term: Improve cardiorespiratory fitness, muscular endurance and strength as measured by increased METs and functional capacity (6MWT)       Able to  understand and use rate of perceived exertion (RPE) scale  Yes       Intervention  Provide education and explanation on how to use RPE scale       Expected Outcomes  Short Term: Able to use RPE daily in rehab to express subjective intensity level;Long Term:  Able to use RPE to guide intensity level when exercising independently       Able to understand and use Dyspnea scale  Yes       Intervention  Provide education and explanation on how to use Dyspnea scale       Expected Outcomes  Short Term: Able to use Dyspnea scale daily in rehab to express subjective sense of shortness of breath during exertion;Long Term: Able to use Dyspnea scale to guide intensity level when exercising independently       Knowledge and understanding of Target Heart Rate Range (THRR)  Yes       Intervention  Provide education and explanation of THRR including how the numbers were predicted and where they are located for reference       Expected Outcomes  Short Term: Able to state/look up THRR;Short Term: Able to use daily as guideline for intensity in rehab;Long Term: Able to use THRR to govern intensity when exercising independently       Understanding of Exercise Prescription  Yes       Intervention  Provide education, explanation, and written  materials on patient's individual exercise prescription       Expected Outcomes  Short Term: Able to explain program exercise prescription;Long Term: Able to explain home exercise prescription to exercise independently          Exercise Goals Re-Evaluation : Exercise Goals Re-Evaluation    Row Name 11/21/17 1344 12/23/17 0712           Exercise Goal Re-Evaluation   Exercise Goals Review  Increase Physical Activity;Able to understand and use rate of perceived exertion (RPE) scale;Knowledge and understanding of Target Heart Rate Range (THRR);Understanding of Exercise Prescription;Increase Strength and Stamina;Able to understand and use Dyspnea scale  Increase Physical Activity;Able to understand and use rate of perceived exertion (RPE) scale;Knowledge and understanding of Target Heart Rate Range (THRR);Understanding of Exercise Prescription;Increase Strength and Stamina;Able to understand and use Dyspnea scale      Comments  The patient has only attended three rehab sessions. He has shown initiative and has had workload increase. Will cont. to monitor patients and progress as able.  Patient is able to walk 15 laps (200 ft each) in 15 minutes. Patient average 2.4 METS. Will cont. to progress as able.       Expected Outcomes  Through exercise at rehab and at home, patient will increase physical activity, strength, and stamina. Patient will also gain the confidence and knowledge to start an exercise regime at home and adhere to it long term.   Through exercise at rehab and at home, patient will increase physical activity, strength, and stamina. Patient will also gain the confidence and knowledge to start an exercise regime at home and adhere to it long term.          Discharge Exercise Prescription (Final Exercise Prescription Changes): Exercise Prescription Changes - 12/16/17 1200      Response to Exercise   Blood Pressure (Admit)  100/60    Blood Pressure (Exercise)  98/62    Blood Pressure  (Exit)  118/72    Heart Rate (Admit)  76 bpm    Heart Rate (  Exercise)  128 bpm    Heart Rate (Exit)  97 bpm    Oxygen Saturation (Admit)  99 %    Oxygen Saturation (Exercise)  89 %    Oxygen Saturation (Exit)  95 %    Rating of Perceived Exertion (Exercise)  12    Perceived Dyspnea (Exercise)  2    Duration  Progress to 45 minutes of aerobic exercise without signs/symptoms of physical distress    Intensity  THRR unchanged      Progression   Progression  Continue to progress workloads to maintain intensity without signs/symptoms of physical distress.      Resistance Training   Training Prescription  Yes    Weight  blue bands    Reps  10-15    Time  10 Minutes      Bike   Level  1.3    Minutes  17      NuStep   Level  6    SPM  80    Minutes  17    METs  2.6      Track   Laps  15    Minutes  17       Nutrition:  Target Goals: Understanding of nutrition guidelines, daily intake of sodium <1569m, cholesterol <2075m calories 30% from fat and 7% or less from saturated fats, daily to have 5 or more servings of fruits and vegetables.  Biometrics: Pre Biometrics - 11/03/17 1040      Pre Biometrics   Grip Strength  37 kg        Nutrition Therapy Plan and Nutrition Goals: Nutrition Therapy & Goals - 12/02/17 1206      Nutrition Therapy   Diet  General, Healthful      Personal Nutrition Goals   Nutrition Goal  Pt to stop using No-Salt or Nu-Salt and consider using salt sparingly.    Personal Goal #2  Pt to increase his vegetable consumption by adding a vegetable to lunch and/or dinner at least 3 days/week.       Intervention Plan   Intervention  Prescribe, educate and counsel regarding individualized specific dietary modifications aiming towards targeted core components such as weight, hypertension, lipid management, diabetes, heart failure and other comorbidities.    Expected Outcomes  Short Term Goal: Understand basic principles of dietary content, such as  calories, fat, sodium, cholesterol and nutrients.;Long Term Goal: Adherence to prescribed nutrition plan.       Nutrition Assessments: Nutrition Assessments - 12/02/17 1206      Rate Your Plate Scores   Pre Score  43       Nutrition Goals Re-Evaluation:   Nutrition Goals Discharge (Final Nutrition Goals Re-Evaluation):   Psychosocial: Target Goals: Acknowledge presence or absence of significant depression and/or stress, maximize coping skills, provide positive support system. Participant is able to verbalize types and ability to use techniques and skills needed for reducing stress and depression.  Initial Review & Psychosocial Screening: Initial Psych Review & Screening - 11/03/17 1033      Initial Review   Current issues with  None Identified      Family Dynamics   Good Support System?  Yes      Barriers   Psychosocial barriers to participate in program  There are no identifiable barriers or psychosocial needs.      Screening Interventions   Interventions  Encouraged to exercise       Quality of Life Scores:  Scores of 19 and below usually indicate  a poorer quality of life in these areas.  A difference of  2-3 points is a clinically meaningful difference.  A difference of 2-3 points in the total score of the Quality of Life Index has been associated with significant improvement in overall quality of life, self-image, physical symptoms, and general health in studies assessing change in quality of life.   PHQ-9: Recent Review Flowsheet Data    Depression screen [facility] 2/9 11/03/2017   Decreased Interest 0   Down, Depressed, Hopeless 0   PHQ - 2 Score 0     Interpretation of Total Score  Total Score Depression Severity:  1-4 = Minimal depression, 5-9 = Mild depression, 10-14 = Moderate depression, 15-19 = Moderately severe depression, 20-27 = Severe depression   Psychosocial Evaluation and Intervention: Psychosocial Evaluation - 11/03/17 1033      Psychosocial  Evaluation & Interventions   Interventions  Encouraged to exercise with the program and follow exercise prescription    Expected Outcomes  patient will remain free from psychosocial barriers to participation in pulmonary rehab    Continue Psychosocial Services   No Follow up required       Psychosocial Re-Evaluation: Psychosocial Re-Evaluation    [place] Name 11/24/17 1023 12/22/17 1218           Psychosocial Re-Evaluation   Current issues with  None Identified  None Identified      Expected Outcomes  patient will remain free from psychosocial barriers to pulmonary rehab participation  patient will remain free from psychosocial barriers to pulmonary rehab participation      Interventions  Encouraged to attend Pulmonary Rehabilitation for the exercise  Encouraged to attend Pulmonary Rehabilitation for the exercise      Continue Psychosocial Services   Follow up required by staff  No Follow up required         Psychosocial Discharge (Final Psychosocial Re-Evaluation): Psychosocial Re-Evaluation - 12/22/17 1218      Psychosocial Re-Evaluation   Current issues with  None Identified    Expected Outcomes  patient will remain free from psychosocial barriers to pulmonary rehab participation    Interventions  Encouraged to attend Pulmonary Rehabilitation for the exercise    Continue Psychosocial Services   No Follow up required       Education: Education Goals: Education classes will be provided on a weekly basis, covering required topics. Participant will state understanding/return demonstration of topics presented.  Learning Barriers/Preferences: Learning Barriers/Preferences - 11/03/17 1032      Learning Barriers/Preferences   Learning Barriers  None    Learning Preferences  Verbal Instruction;Written Material;Group Instruction;Individual Instruction       Education Topics: Risk Factor Reduction:  -Group instruction that is supported by a PowerPoint presentation. Instructor  discusses the definition of a risk factor, different risk factors for pulmonary disease, and how the heart and lungs work together.     Nutrition for Pulmonary Patient:  -Group instruction provided by PowerPoint slides, verbal discussion, and written materials to support subject matter. The instructor gives an explanation and review of healthy diet recommendations, which includes a discussion on weight management, recommendations for fruit and vegetable consumption, as well as protein, fluid, caffeine, fiber, sodium, sugar, and alcohol. Tips for eating when patients are short of breath are discussed.   Pursed Lip Breathing:  -Group instruction that is supported by demonstration and informational handouts. Instructor discusses the benefits of pursed lip and diaphragmatic breathing and detailed demonstration on how to preform both.     Oxygen  Safety:  -Group instruction provided by PowerPoint, verbal discussion, and written material to support subject matter. There is an overview of "What is Oxygen" and "Why do we need it".  Instructor also reviews how to create a safe environment for oxygen use, the importance of using oxygen as prescribed, and the risks of noncompliance. There is a brief discussion on traveling with oxygen and resources the patient may utilize.   PULMONARY REHAB OTHER RESPIRATORY from 12/18/2017 in [place]  Date  12/04/17  Educator  Lucianne Lei Chambers  Instruction Review Code  2- Demonstrated Understanding      Oxygen Equipment:  -Group instruction provided by [facility] Staff utilizing handouts, written materials, and equipment demonstrations.   PULMONARY REHAB OTHER RESPIRATORY from 12/18/2017 in [place]  Date  12/18/17  Educator  [place]  Instruction Review Code  2- Demonstrated Understanding      Signs and Symptoms:  -Group instruction provided by written material and verbal discussion to support  subject matter. Warning signs and symptoms of infection, stroke, and heart attack are reviewed and when to call the physician/911 reinforced. Tips for preventing the spread of infection discussed.   Advanced Directives:  -Group instruction provided by verbal instruction and written material to support subject matter. Instructor reviews Advanced Directive laws and proper instruction for filling out document.   Pulmonary Video:  -Group video education that reviews the importance of medication and oxygen compliance, exercise, good nutrition, pulmonary hygiene, and pursed lip and diaphragmatic breathing for the pulmonary patient.   Exercise for the Pulmonary Patient:  -Group instruction that is supported by a PowerPoint presentation. Instructor discusses benefits of exercise, core components of exercise, frequency, duration, and intensity of an exercise routine, importance of utilizing pulse oximetry during exercise, safety while exercising, and options of places to exercise outside of rehab.     Pulmonary Medications:  -Verbally interactive group education provided by instructor with focus on inhaled medications and proper administration.   Anatomy and Physiology of the Respiratory System and Intimacy:  -Group instruction provided by PowerPoint, verbal discussion, and written material to support subject matter. Instructor reviews respiratory cycle and anatomical components of the respiratory system and their functions. Instructor also reviews differences in obstructive and restrictive respiratory diseases with examples of each. Intimacy, Sex, and Sexuality differences are reviewed with a discussion on how relationships can change when diagnosed with pulmonary disease. Common sexual concerns are reviewed.   MD DAY -A group question and answer session with a medical doctor that allows participants to ask questions that relate to their pulmonary disease state.   PULMONARY REHAB OTHER RESPIRATORY  from 12/18/2017 in [place]  Date  11/27/17  Educator  Dr. Nelda Marseille  Instruction Review Code  1- Verbalizes Understanding      OTHER EDUCATION -Group or individual verbal, written, or video instructions that support the educational goals of the pulmonary rehab program.   PULMONARY REHAB OTHER RESPIRATORY from 12/18/2017 in [place]  Date  12/11/17 [Beat a sedentary lifestyle]  Educator  [place]  Instruction Review Code  1- Verbalizes Understanding      Holiday Eating Survival Tips:  -Group instruction provided by PowerPoint slides, verbal discussion, and written materials to support subject matter. The instructor gives patients tips, tricks, and techniques to help them not only survive but enjoy the holidays despite the onslaught of food that accompanies the holidays.   Knowledge Questionnaire  Score: Knowledge Questionnaire Score - 11/05/17 1224      Knowledge Questionnaire Score   Pre Score  16/18       Core Components/Risk Factors/Patient Goals at Admission: Personal Goals and Risk Factors at Admission - 11/03/17 1033      Core Components/Risk Factors/Patient Goals on Admission   Improve shortness of breath with ADL's  Yes    Intervention  Provide education, individualized exercise plan and daily activity instruction to help decrease symptoms of SOB with activities of daily living.    Expected Outcomes  Short Term: Improve cardiorespiratory fitness to achieve a reduction of symptoms when performing ADLs;Long Term: Be able to perform more ADLs without symptoms or delay the onset of symptoms    Heart Failure  Yes    Intervention  Provide a combined exercise and nutrition program that is supplemented with education, support and counseling about heart failure. Directed toward relieving symptoms such as shortness of breath, decreased exercise tolerance, and extremity edema.    Expected Outcomes  Improve  functional capacity of life;Short term: Attendance in program 2-3 days a week with increased exercise capacity. Reported lower sodium intake. Reported increased fruit and vegetable intake. Reports medication compliance.;Short term: Daily weights obtained and reported for increase. Utilizing diuretic protocols set by physician.;Long term: Adoption of self-care skills and reduction of barriers for early signs and symptoms recognition and intervention leading to self-care maintenance.       Core Components/Risk Factors/Patient Goals Review:  Goals and Risk Factor Review    Row Name 11/24/17 1019 12/22/17 1212           Core Components/Risk Factors/Patient Goals Review   Personal Goals Review  Heart Failure;Improve shortness of breath with ADL's;Develop more efficient breathing techniques such as purse lipped breathing and diaphragmatic breathing and practicing self-pacing with activity.  Heart Failure;Improve shortness of breath with ADL's;Develop more efficient breathing techniques such as purse lipped breathing and diaphragmatic breathing and practicing self-pacing with activity.      Review  patient is doing well in pulmonary rehab. he has only attended 3 session since admission but in those 3 sesions it is evident that he is aware of HF self care. he makes comments such as "that is not what I weighed this morning" which leads me to believe he is weighing himself daily as expected of a HF patient. He is beginning to use PRB independently however he needs cueing most of the time. he states he is enjoying the program and has even reconnected to an old friend who is also attending pulmonary rehab. expect to see greater progression towards pulmonary rehab goals over the next 30 days.  Patient continues to do well. he states his shortness of breath has improved during exertion. he is using pursed lip breathing and is learning to pace himself when he walks on the track. he is able to walk 14-17 laps in 15  minutes with minimal shortness of breath and no restbreaks. will continue to monitor progression towards goals over next 30 days. he is compliant with all HF home care including daily weights and verbalizes importance of low NA diet and daily weights as well as when to report weights to MD. This HF goal is met and will not be addressed during next 30 day unless needed.      Expected Outcomes  see "admission expected outcomes"  see "admission expected outcomes"         Core Components/Risk Factors/Patient Goals at Discharge (Final Review):  Goals and  Risk Factor Review - 12/22/17 1212      Core Components/Risk Factors/Patient Goals Review   Personal Goals Review  Heart Failure;Improve shortness of breath with ADL's;Develop more efficient breathing techniques such as purse lipped breathing and diaphragmatic breathing and practicing self-pacing with activity.    Review  Patient continues to do well. he states his shortness of breath has improved during exertion. he is using pursed lip breathing and is learning to pace himself when he walks on the track. he is able to walk 14-17 laps in 15 minutes with minimal shortness of breath and no restbreaks. will continue to monitor progression towards goals over next 30 days. he is compliant with all HF home care including daily weights and verbalizes importance of low NA diet and daily weights as well as when to report weights to MD. This HF goal is met and will not be addressed during next 30 day unless needed.    Expected Outcomes  see "admission expected outcomes"       ITP Comments: ITP Comments    Row Name 12/22/17 1219           ITP Comments  Dr. Jennet Maduro, Medical Director          Comments: patient has attended 11 pulmonary rehab sessions since admission

## 2017-12-29 ENCOUNTER — Encounter: Payer: Self-pay | Admitting: Adult Health

## 2017-12-29 ENCOUNTER — Ambulatory Visit (INDEPENDENT_AMBULATORY_CARE_PROVIDER_SITE_OTHER): Payer: Medicare Other | Admitting: Adult Health

## 2017-12-29 VITALS — BP 116/68 | HR 66 | Ht 75.0 in | Wt 252.3 lb

## 2017-12-29 DIAGNOSIS — I2729 Other secondary pulmonary hypertension: Secondary | ICD-10-CM | POA: Diagnosis not present

## 2017-12-29 DIAGNOSIS — J449 Chronic obstructive pulmonary disease, unspecified: Secondary | ICD-10-CM

## 2017-12-29 DIAGNOSIS — I5032 Chronic diastolic (congestive) heart failure: Secondary | ICD-10-CM

## 2017-12-29 NOTE — Assessment & Plan Note (Signed)
Controlled on current regimen improved symptom control on Spiriva Continue with pulmonary rehab  Plan  Patient Instructions  Continue on Spiriva 2 puffs daily , rinse well after use.  Set up 2 D echo .  Go for CT chest in August as planned for lung nodule .  Cont on Lasix .  Follow up with Cardiology as planned and As needed Follow up with [name]  In 4 months and As needed   Please contact office for sooner follow up if symptoms do not improve or worsen or seek emergency care  Check with Dr. Maudie Mercury to see if you have had Prevnar 13 vaccine.

## 2017-12-29 NOTE — Patient Instructions (Addendum)
Continue on Spiriva 2 puffs daily , rinse well after use.  Set up 2 D echo .  Go for CT chest in August as planned for lung nodule .  Cont on Lasix .  Follow up with Cardiology as planned and As needed Follow up with [name]  In 4 months and As needed   Please contact office for sooner follow up if symptoms do not improve or worsen or seek emergency care  Check with Dr. Maudie Mercury to see if you have had Prevnar 13 vaccine.

## 2017-12-29 NOTE — Progress Notes (Signed)
Reviewed & agree with plan

## 2017-12-29 NOTE — Assessment & Plan Note (Signed)
Cont on current regimen  No evidence of volume overload on exam  Cont follow up with cards  Echo pending .

## 2017-12-29 NOTE — Assessment & Plan Note (Signed)
Pulmonary hypertension noted on echo January 2019 patient with underlying Diastolc CHF , COPD, and previous PE  Previous Echo following PE showed PAP normal in 2017 .  Will repeat echo , to see if better .  May need repeat sleep study . And or ONO  No exertional hypoxia .   Plan  Patient Instructions  Continue on Spiriva 2 puffs daily , rinse well after use.  Set up 2 D echo .  Go for CT chest in August as planned for lung nodule .  Cont on Lasix .  Follow up with Cardiology as planned and As needed Follow up with [name]  In 4 months and As needed   Please contact office for sooner follow up if symptoms do not improve or worsen or seek emergency care  Check with Dr. Maudie Mercury to see if you have had Prevnar 13 vaccine.

## 2017-12-29 NOTE — Progress Notes (Signed)
_0  ID: Joshua [name], male    DOB: [birth year], 82 y.o.   MRN: [id number]  Chief Complaint  Patient presents with  . Follow-up    COPD     Referring provider: Jani Gravel, MD  HPI: 82 year old male former smoker followed for COPD, Pulmonary HTN ,  lung nodule  Previous PE postop after knee replacement in 2016. Past medical history significant for diastolic heart failure  TEST  09/15/14 CT chest showed a large bilateral pulmonary embolism with RV dilatation. 08/2014 Venous Doppler showed no DVT 08/2014 2-D echo showed an EF of 95-09%, grade 1 diastolic dysfunction, Right atrium dilatation and severely increased pulmonary artery pressures around 68 mm Hg   PFTs 11/2014 - FEV1 59%, no BD response, ratio 70, TLC 68%, DLCO 35%  Echo 08/2015 slight dilated RV CT chest January 2019 showed left upper lobe pulmonary nodules largest 9 x 6 mm  12/29/2017 Follow up : COPD , Lung nodule , pulmonary hypertension Patient returns for a 59-monthfollow-up.  Patient has underlying moderate COPD. [facility] , says it has helped. . Uses his albuterol inhaler as needed. Has rare cough . On ACE inhibitor .  Denies any flare of cough or shortness of breath He does get winded with heavy activity and prolonged walking.or inclines .  He was referred to pulmonary rehab last visit.  Patient says it is okay . Is able to exercise .  He works as sPsychologist, forensic   Patient has diastolic heart failure last echo January 2019 showed preserved EF, grade 1 diastolic dysfunction aortic valve with mild to moderate regurg and severely dilated right ventricle with pulmonary hypertension PA P pressure 73 mmHg Patient had cardiac stress test February 2019 that was felt to be a low risk study with no ischemia noted.[place] with Dr. CBurt Knack  Says he was checked for sleep apnea in past , told he did not have OSA . But this was many years ago.   PVX is utd.    Allergies    Allergen Reactions  . Other     Beer- Swelling   . Sunflower Oil Swelling  . Sulfa Antibiotics Rash    Immunization History  Administered Date(s) Administered  . Influenza, High Dose Seasonal PF 05/19/2017  . Influenza,inj,Quad PF,6+ Mos 07/19/2014, 05/29/2015  . Influenza-Unspecified 05/19/2016  . Pneumococcal Polysaccharide-23 12/29/2008    Past Medical History:  Diagnosis Date  . Arthritis   . BPH (benign prostatic hyperplasia)   . COPD (chronic obstructive pulmonary disease) ([place]   . Folliculitis    posterior scalp per office visit note of Dr KMaudie Mercury12/09/2013    . GERD (gastroesophageal reflux disease)   . Hypertension   . PE [place]   . Phlebitis    right arm  at least 20 years ago   . Pneumonia    hx of pneumonia as a child     Tobacco History: Social History   Tobacco Use  Smoking Status Former Smoker  . Packs/day: 1.00  . Years: 50.00  . Pack years: 50.00  . Types: Cigarettes  . Last attempt to quit: 08/19/2006  . Years since quitting: 11.3  Smokeless Tobacco Never Used   Counseling given: Not Answered   Outpatient Encounter Medications as of 12/29/2017  Medication Sig  . albuterol (PROVENTIL HFA;VENTOLIN HFA) 108 (90 Base) MCG/ACT inhaler Inhale 1-2 puffs into the lungs every 6 (six) hours as needed for wheezing or shortness  of breath.  . allopurinol (ZYLOPRIM) 100 MG tablet Take 100 mg by mouth daily.  Marland Kitchen aspirin 81 MG tablet Take 81 mg by mouth daily.  . Cinnamon 500 MG capsule Take 500 mg by mouth daily.  Marland Kitchen doxazosin (CARDURA) 4 MG tablet Take 4 mg by mouth at bedtime. Patient takes in the pm  . furosemide (LASIX) 20 MG tablet Take 20-40 mg by mouth daily. Take 20 mg everyday on Monday, Wednesday and Friday take 40 mg  . pantoprazole (PROTONIX) 40 MG tablet Take 40 mg by mouth daily.  . ramipril (ALTACE) 2.5 MG capsule Take 2.5 mg by mouth daily.  . Tiotropium Bromide Monohydrate (SPIRIVA RESPIMAT) 1.25 MCG/ACT AERS Inhale 2  puffs into the lungs daily.   No facility-administered encounter medications on file as of 12/29/2017.      Review of Systems  Constitutional:   No  weight loss, night sweats,  Fevers, chills, + fatigue, or  lassitude.  HEENT:   No headaches,  Difficulty swallowing,  Tooth/dental problems, or  Sore throat,                No sneezing, itching, ear ache, nasal congestion, post nasal drip,   CV:  No chest pain,  Orthopnea, PND, swelling in lower extremities, anasarca, dizziness, palpitations, syncope.   GI  No heartburn, indigestion, abdominal pain, nausea, vomiting, diarrhea, change in bowel habits, loss of appetite, bloody stools.   Resp:    No chest wall deformity  Skin: no rash or lesions.  GU: no dysuria, change in color of urine, no urgency or frequency.  No flank pain, no hematuria   MS:  No joint pain or swelling.  No decreased range of motion.  No back pain.    Physical Exam  BP 116/68 (BP Location: Left Arm, Cuff Size: Normal)   Pulse 66   Ht _0  (1.905 m)   Wt 252 lb 4.8 oz (114.4 kg)   SpO2 96%   BMI 31.54 kg/m   GEN: A/Ox3; pleasant , NAD, obese    HEENT:  [place]/AT,  EACs-clear, TMs-wnl, NOSE-clear, THROAT-clear, no lesions, no postnasal drip or exudate noted.   NECK:  Supple w/ fair ROM; no JVD; normal carotid impulses w/o bruits; no thyromegaly or nodules palpated; no lymphadenopathy.    RESP  Decreased BS in bases  no accessory muscle use, no dullness to percussion  CARD:  RRR, no m/r/g, no peripheral edema, pulses intact, no cyanosis or clubbing.  GI:   Soft & nt; nml bowel sounds; no organomegaly or masses detected.   Musco: Warm bil, no deformities or joint swelling noted.   Neuro: alert, no focal deficits noted.    Skin: Warm, no lesions or rashes    Lab Results:  CBC    Component Value Date/Time   WBC 4.7 08/27/2017 0432   RBC 5.13 08/27/2017 0432   HGB 14.3 08/27/2017 0432   HCT 45.1 08/27/2017 0432   PLT 174 08/27/2017 0432   MCV  87.9 08/27/2017 0432   MCH 27.9 08/27/2017 0432   MCHC 31.7 08/27/2017 0432   RDW 14.4 08/27/2017 0432   LYMPHSABS 1.2 02/16/2015 1016   MONOABS 0.7 02/16/2015 1016   EOSABS 0.3 02/16/2015 1016   BASOSABS 0.0 02/16/2015 1016    BMET    Component Value Date/Time   NA 139 09/18/2017 1100   K 4.5 09/18/2017 1100   CL 99 09/18/2017 1100   CO2 27 09/18/2017 1100   GLUCOSE 100 (H) 09/18/2017 1100  GLUCOSE 90 08/27/2017 0432   BUN 17 09/18/2017 1100   CREATININE 1.07 09/18/2017 1100   CALCIUM 10.0 09/18/2017 1100   GFRNONAA 66 09/18/2017 1100   GFRAA 76 09/18/2017 1100    BNP    Component Value Date/Time   BNP 1,253.8 (H) 08/24/2017 2210    ProBNP No results found for: PROBNP  Imaging: No results found.   Assessment & Plan:   COPD (chronic obstructive pulmonary disease) ([place]) Controlled on current regimen improved symptom control on Spiriva Continue with pulmonary rehab  Plan  Patient Instructions  Continue on Spiriva 2 puffs daily , rinse well after use.  Set up 2 D echo .  Go for CT chest in August as planned for lung nodule .  Cont on Lasix .  Follow up with Cardiology as planned and As needed Follow up with [name]  In 4 months and As needed   Please contact office for sooner follow up if symptoms do not improve or worsen or seek emergency care  Check with Dr. Maudie Mercury to see if you have had Prevnar 13 vaccine.        Other secondary pulmonary hypertension ([place]) Pulmonary hypertension noted on echo January 2019 patient with underlying Diastolc CHF , COPD, and previous PE  Previous Echo following PE showed PAP normal in 2017 .  Will repeat echo , to see if better .  May need repeat sleep study . And or ONO  No exertional hypoxia .   Plan  Patient Instructions  Continue on Spiriva 2 puffs daily , rinse well after use.  Set up 2 D echo .  Go for CT chest in August as planned for lung nodule .  Cont on Lasix .  Follow up with Cardiology as planned and As  needed Follow up with [name]  In 4 months and As needed   Please contact office for sooner follow up if symptoms do not improve or worsen or seek emergency care  Check with Dr. Maudie Mercury to see if you have had Prevnar 13 vaccine.        Diastolic CHF ([place]) Cont on current regimen  No evidence of volume overload on exam  Cont follow up with cards  Echo pending .      Rexene Edison, NP 12/29/2017

## 2017-12-30 ENCOUNTER — Encounter (HOSPITAL_COMMUNITY)
Admission: RE | Admit: 2017-12-30 | Discharge: 2017-12-30 | Disposition: A | Discharge status: Home / Self Care | Payer: Medicare Other | Source: Ambulatory Visit | Attending: Pulmonary Disease | Admitting: Pulmonary Disease

## 2017-12-30 VITALS — Wt 254.0 lb

## 2017-12-30 DIAGNOSIS — J432 Centrilobular emphysema: Secondary | ICD-10-CM

## 2017-12-30 NOTE — Progress Notes (Signed)
Daily Session Note  Patient Details  Name: Joshua [name] MRN: [id number] Date of Birth: [birth year] Referring Provider:     Pulmonary Rehab Walk Test from 11/04/2017 in [place]  Referring Provider  [name]       Encounter Date: 12/30/2017  Check In: Session Check In - 12/30/17 1212      Check-In   Location  [facility]    Staff Present  Cloyde Reams DiVincenzo, MS, ACSM RCEP, Exercise Physiologist;Lisa Colletta [place], RN, [place] physician immediately available to respond to emergencies  [facility] immediately available    Physician(s)  Dr. Loleta Books    Medication changes reported      No    Fall or balance concerns reported     No    Tobacco Cessation  No Change    Warm-up and Cool-down  Performed as group-led instruction    Resistance Training Performed  Yes    VAD Patient?  No      Pain Assessment   Currently in Pain?  No/denies    Multiple Pain Sites  No       Capillary Blood Glucose: No results found for this or any previous visit (from the past 24 hour(s)).  Exercise Prescription Changes - 12/30/17 1200      Response to Exercise   Blood Pressure (Admit)  98/62    Blood Pressure (Exercise)  100/50    Blood Pressure (Exit)  98/68    Heart Rate (Admit)  95 bpm    Heart Rate (Exercise)  138 bpm    Heart Rate (Exit)  106 bpm    Oxygen Saturation (Admit)  95 %    Oxygen Saturation (Exercise)  88 %    Oxygen Saturation (Exit)  93 %    Rating of Perceived Exertion (Exercise)  11    Perceived Dyspnea (Exercise)  2    Duration  Progress to 45 minutes of aerobic exercise without signs/symptoms of physical distress    Intensity  THRR unchanged      Progression   Progression  Continue to progress workloads to maintain intensity without signs/symptoms of physical distress.      Resistance Training   Training Prescription  Yes    Weight  blue bands    Reps  10-15    Time  10 Minutes      Bike   Level  1.3    Minutes  17      NuStep   Level  6    SPM  80    Minutes  17    METs  2.1      Track   Laps  15    Minutes  17       Social History   Tobacco Use  Smoking Status Former Smoker  . Packs/day: 1.00  . Years: 50.00  . Pack years: 50.00  . Types: Cigarettes  . Last attempt to quit: 08/19/2006  . Years since quitting: 11.3  Smokeless Tobacco Never Used    Goals Met:  Exercise tolerated well No report of cardiac concerns or symptoms Strength training completed today  Goals Unmet:  Not Applicable  Comments: Service time is from 10:30A to 12:00P    [name] is Medical Director for Pulmonary Rehab at [facility].

## 2018-01-01 ENCOUNTER — Encounter (HOSPITAL_COMMUNITY)
Admission: RE | Admit: 2018-01-01 | Discharge: 2018-01-01 | Disposition: A | Discharge status: Home / Self Care | Payer: Medicare Other | Source: Ambulatory Visit | Attending: Pulmonary Disease | Admitting: Pulmonary Disease

## 2018-01-01 DIAGNOSIS — J432 Centrilobular emphysema: Secondary | ICD-10-CM | POA: Diagnosis not present

## 2018-01-01 NOTE — Progress Notes (Signed)
Daily Session Note  Patient Details  Name: Joshua [name] MRN: [id number] Date of Birth: [birth year] Referring Provider:     Pulmonary Rehab Walk Test from 11/04/2017 in [place]  Referring Provider  [name]       Encounter Date: 01/01/2018  Check In: Session Check In - 01/01/18 1115      Check-In   Location  [facility]    Staff Present  Cloyde Reams DiVincenzo, MS, ACSM RCEP, Exercise Physiologist;Lisa Colletta [place], RN, [place] physician immediately available to respond to emergencies  [facility] immediately available    Physician(s)  Dr. Tawanna Solo    Medication changes reported      No    Fall or balance concerns reported     No    Tobacco Cessation  No Change    Warm-up and Cool-down  Performed as group-led instruction    Resistance Training Performed  Yes    VAD Patient?  No      Pain Assessment   Currently in Pain?  No/denies    Multiple Pain Sites  No       Capillary Blood Glucose: No results found for this or any previous visit (from the past 24 hour(s)).    Social History   Tobacco Use  Smoking Status Former Smoker  . Packs/day: 1.00  . Years: 50.00  . Pack years: 50.00  . Types: Cigarettes  . Last attempt to quit: 08/19/2006  . Years since quitting: 11.3  Smokeless Tobacco Never Used    Goals Met:  Exercise tolerated well No report of cardiac concerns or symptoms Strength training completed today  Goals Unmet:  Not Applicable  Comments: Service time is from 10:30a to 12:25p    [name] is Medical Director for Pulmonary Rehab at [facility].

## 2018-01-05 ENCOUNTER — Ambulatory Visit (HOSPITAL_COMMUNITY): Payer: Medicare Other | Attending: Cardiology

## 2018-01-05 ENCOUNTER — Other Ambulatory Visit: Payer: Self-pay

## 2018-01-05 DIAGNOSIS — J449 Chronic obstructive pulmonary disease, unspecified: Secondary | ICD-10-CM | POA: Insufficient documentation

## 2018-01-05 DIAGNOSIS — I2729 Other secondary pulmonary hypertension: Secondary | ICD-10-CM | POA: Diagnosis not present

## 2018-01-05 DIAGNOSIS — I371 Nonrheumatic pulmonary valve insufficiency: Secondary | ICD-10-CM | POA: Insufficient documentation

## 2018-01-06 ENCOUNTER — Encounter (HOSPITAL_COMMUNITY): Admission: RE | Admit: 2018-01-06 | Payer: Medicare Other | Source: Ambulatory Visit

## 2018-01-08 ENCOUNTER — Encounter (HOSPITAL_COMMUNITY)
Admission: RE | Admit: 2018-01-08 | Discharge: 2018-01-08 | Disposition: A | Discharge status: Home / Self Care | Payer: Medicare Other | Source: Ambulatory Visit | Attending: Pulmonary Disease | Admitting: Pulmonary Disease

## 2018-01-08 DIAGNOSIS — J432 Centrilobular emphysema: Secondary | ICD-10-CM

## 2018-01-08 NOTE — Progress Notes (Signed)
Daily Session Note  Patient Details  Name: Joshua [name] MRN: [id number] Date of Birth: October 09, 1935 Referring Provider:     Pulmonary Rehab Walk Test from 11/04/2017 in [place]  Referring Provider  [name]       Encounter Date: 01/08/2018  Check In: Session Check In - 01/08/18 1209      Check-In   Location  [facility]    Staff Present  Su Hilt, MS, ACSM RCEP, Exercise Physiologist;Lisa Ysidro Evert, RN    Supervising physician immediately available to respond to emergencies  [facility] immediately available    Physician(s)  Dr. Wyline Copas    Medication changes reported      No    Fall or balance concerns reported     No    Tobacco Cessation  No Change    Warm-up and Cool-down  Performed as group-led instruction    Resistance Training Performed  Yes    VAD Patient?  No      Pain Assessment   Currently in Pain?  No/denies    Multiple Pain Sites  No       Capillary Blood Glucose: No results found for this or any previous visit (from the past 24 hour(s)).    Social History   Tobacco Use  Smoking Status Former Smoker  . Packs/day: 1.00  . Years: 50.00  . Pack years: 50.00  . Types: Cigarettes  . Last attempt to quit: 08/19/2006  . Years since quitting: 11.3  Smokeless Tobacco Never Used    Goals Met:  Exercise tolerated well No report of cardiac concerns or symptoms Strength training completed today  Goals Unmet:  Not Applicable  Comments: Service time is from 10:30a to 12:00p    [name] is Medical Director for Pulmonary Rehab at [facility].

## 2018-01-13 ENCOUNTER — Encounter (HOSPITAL_COMMUNITY)
Admission: RE | Admit: 2018-01-13 | Discharge: 2018-01-13 | Disposition: A | Discharge status: Home / Self Care | Payer: Medicare Other | Source: Ambulatory Visit | Attending: Pulmonary Disease | Admitting: Pulmonary Disease

## 2018-01-13 VITALS — Wt 252.6 lb

## 2018-01-13 DIAGNOSIS — J432 Centrilobular emphysema: Secondary | ICD-10-CM | POA: Diagnosis not present

## 2018-01-13 NOTE — Progress Notes (Signed)
Daily Session Note  Patient Details  Name: Joshua [name]       MRN: [id number] Date of Birth: April 23, 1936 Referring Provider:     Pulmonary Rehab Walk Test from 11/04/2017 in [place]  Referring Provider  [name]       Encounter Date: 01/13/2018  Check In: Session Check In - 01/13/18 1211      Check-In   Location  [facility]    Staff Present  Cloyde Reams DiVincenzo, MS, ACSM RCEP, Exercise Physiologist;Annedrea Rosezella [place], RN, MHA;Olinty Celesta Aver, MS, ACSM CEP, Exercise Physiologist    Supervising physician immediately available to respond to emergencies  [facility] immediately available    Physician(s)  Dr. Wyline Copas    Medication changes reported      No    Fall or balance concerns reported     No    Tobacco Cessation  No Change    Warm-up and Cool-down  Performed as group-led instruction    Resistance Training Performed  Yes    VAD Patient?  No      Pain Assessment   Currently in Pain?  No/denies    Multiple Pain Sites  No       Capillary Blood Glucose: No results found for this or any previous visit (from the past 24 hour(s)).  Exercise Prescription Changes - 01/13/18 1200      Response to Exercise   Blood Pressure (Admit)  98/64    Blood Pressure (Exercise)  110/58    Blood Pressure (Exit)  102/62    Heart Rate (Admit)  85 bpm    Heart Rate (Exercise)  122 bpm    Heart Rate (Exit)  90 bpm    Oxygen Saturation (Admit)  95 %    Oxygen Saturation (Exercise)  85 %    Oxygen Saturation (Exit)  97 %    Rating of Perceived Exertion (Exercise)  11    Perceived Dyspnea (Exercise)  1    Duration  Progress to 45 minutes of aerobic exercise without signs/symptoms of physical distress    Intensity  THRR unchanged      Progression   Progression  Continue to progress workloads to maintain intensity without signs/symptoms of physical distress.      Resistance Training   Training Prescription  Yes    Weight  blue bands    Reps  10-15    Time  10 Minutes      Bike   Level  1.3    Minutes  17      NuStep   Level  6    SPM  80    Minutes  17    METs  2.9      Track   Laps  15    Minutes  17       Social History   Tobacco Use  Smoking Status Former Smoker  . Packs/day: 1.00  . Years: 50.00  . Pack years: 50.00  . Types: Cigarettes  . Last attempt to quit: 08/19/2006  . Years since quitting: 11.4  Smokeless Tobacco Never Used    Goals Met:  Personal goals reviewed No report of cardiac concerns or symptoms Strength training completed today  Goals Unmet:  Not Applicable  Comments: Service time is from 10:30 to 12:00      [name] is Medical Director for Pulmonary Rehab at [facility].

## 2018-01-15 ENCOUNTER — Telehealth: Payer: Self-pay | Admitting: *Deleted

## 2018-01-15 ENCOUNTER — Ambulatory Visit (INDEPENDENT_AMBULATORY_CARE_PROVIDER_SITE_OTHER): Payer: Medicare Other | Admitting: Cardiovascular Disease

## 2018-01-15 ENCOUNTER — Encounter (HOSPITAL_COMMUNITY): Payer: Medicare Other

## 2018-01-15 ENCOUNTER — Encounter (HOSPITAL_COMMUNITY)
Admission: RE | Admit: 2018-01-15 | Discharge: 2018-01-15 | Disposition: A | Discharge status: Home / Self Care | Payer: Medicare Other | Source: Ambulatory Visit | Attending: Pulmonary Disease | Admitting: Pulmonary Disease

## 2018-01-15 ENCOUNTER — Encounter: Payer: Self-pay | Admitting: Cardiovascular Disease

## 2018-01-15 VITALS — BP 126/80 | HR 85 | Ht 75.0 in | Wt 252.4 lb

## 2018-01-15 DIAGNOSIS — R0683 Snoring: Secondary | ICD-10-CM | POA: Diagnosis not present

## 2018-01-15 DIAGNOSIS — J432 Centrilobular emphysema: Secondary | ICD-10-CM | POA: Diagnosis not present

## 2018-01-15 DIAGNOSIS — I272 Pulmonary hypertension, unspecified: Secondary | ICD-10-CM

## 2018-01-15 DIAGNOSIS — I5032 Chronic diastolic (congestive) heart failure: Secondary | ICD-10-CM

## 2018-01-15 MED ORDER — FUROSEMIDE 40 MG PO TABS: 40.0000 mg | ORAL_TABLET | Freq: Every day | ORAL | 3 refills | Status: DC

## 2018-01-15 NOTE — Progress Notes (Signed)
Daily Session Note  Patient Details  Name: Joshua [name] MRN: [id number] Date of Birth: 12/05/1935 Referring Provider:     Pulmonary Rehab Walk Test from 11/04/2017 in [place]  Referring Provider  [name]       Encounter Date: 01/15/2018  Check In: Session Check In - 01/15/18 1109      Check-In   Location  [facility]    Staff Present  Su Hilt, MS, ACSM RCEP, Exercise Physiologist;Carlette Wilber Oliphant, RN, BSN    Supervising physician immediately available to respond to emergencies  [facility] immediately available    Physician(s)  Dr. Denton Brick    Medication changes reported      No    Fall or balance concerns reported     No    Tobacco Cessation  No Change    Warm-up and Cool-down  Performed as group-led instruction    Resistance Training Performed  Yes    VAD Patient?  No      Pain Assessment   Currently in Pain?  No/denies    Multiple Pain Sites  No       Capillary Blood Glucose: No results found for this or any previous visit (from the past 24 hour(s)).    Social History   Tobacco Use  Smoking Status Former Smoker  . Packs/day: 1.00  . Years: 50.00  . Pack years: 50.00  . Types: Cigarettes  . Last attempt to quit: 08/19/2006  . Years since quitting: 11.4  Smokeless Tobacco Never Used    Goals Met:  Exercise tolerated well No report of cardiac concerns or symptoms Strength training completed today  Goals Unmet:  Not Applicable  Comments: Service time is from 10:30A to 12:30P    [name] is Medical Director for Pulmonary Rehab at [facility].

## 2018-01-15 NOTE — Telephone Encounter (Signed)
-----  Message from Kathryn A Kemp, RN sent at 01/15/2018 10:43 AM EDT ----- Regarding: sleep study ordered Sleep study ordered for precert/scheduling.  Thanks!  Katy  

## 2018-01-15 NOTE — Progress Notes (Signed)
Cardiology Office Note Date:  01/15/2018   ID:  Joshua [name] 1936/05/19, MRN [id number]  PCP:  Joshua Gravel, MD  Cardiologist:  Joshua Mocha, MD    Chief Complaint  Patient presents with  . Shortness of Breath     History of Present Illness: Joshua [name] is a 82 y.o. male who presents for follow-up evaluation.  The patient was hospitalized in January 2019 with acute respiratory failure in the setting of severe COPD but also felt to have a component of diastolic heart failure.  He had a mild troponin elevation.  An echocardiogram during his hospitalization demonstrated normal LV systolic function and severe pulmonary hypertension.  At the time of his last outpatient cardiology follow-up he was doing relatively well.  The patient continues to participate in pulmonary rehab.  He brings in flow sheets today demonstrating excellent blood pressure control.  His heart rate has increased to the 120s and occasionally 130s with aerobic exercise.  He reports no symptoms with this.  He reports stable shortness of breath with moderate level activity.  He is not currently using oxygen.  He recently underwent a follow-up echocardiogram to reassess pulmonary pressures and his pulmonary hypertension was improved with an estimated PA systolic pressure of 39 mmHg.  However, he was noted to have systolic and diastolic flattening of the septum and severe RV dysfunction.  The patient has mild leg swelling but he denies orthopnea or PND.  He is alternating 20 and 40 mg of furosemide every other day.   Past Medical History:  Diagnosis Date  . Arthritis   . BPH (benign prostatic hyperplasia)   . COPD (chronic obstructive pulmonary disease) ([place])   . Folliculitis    posterior scalp per office visit note of Dr Maudie Mercury 07/20/2014    . GERD (gastroesophageal reflux disease)   . Hypertension   . PE (pulmonary thromboembolism) ([place])   . Phlebitis    right arm  at least 20 years ago   . Pneumonia    hx of  pneumonia as a child     Past Surgical History:  Procedure Laterality Date  . bone removed from little toe right foot     . CHOLECYSTECTOMY    . pilonidal cyst removal     . TOTAL KNEE ARTHROPLASTY Left 09/13/2014   Procedure: LEFT TOTAL KNEE ARTHROPLASTY;  Surgeon: Mauri Pole, MD;  Location: [facility];  Service: Orthopedics;  Laterality: Left;    Current Outpatient Medications  Medication Sig Dispense Refill  . albuterol (PROVENTIL HFA;VENTOLIN HFA) 108 (90 Base) MCG/ACT inhaler Inhale 1-2 puffs into the lungs every 6 (six) hours as needed for wheezing or shortness of breath. 1 Inhaler 3  . allopurinol (ZYLOPRIM) 100 MG tablet Take 100 mg by mouth daily.    Marland Kitchen aspirin 81 MG tablet Take 81 mg by mouth daily.    . Cinnamon 500 MG capsule Take 500 mg by mouth daily.    Marland Kitchen doxazosin (CARDURA) 4 MG tablet Take 4 mg by mouth at bedtime. Patient takes in the pm    . furosemide (LASIX) 40 MG tablet Take 1 tablet (40 mg total) by mouth daily. 90 tablet 3  . pantoprazole (PROTONIX) 40 MG tablet Take 40 mg by mouth daily.    . ramipril (ALTACE) 2.5 MG capsule Take 2.5 mg by mouth daily.    . Tiotropium Bromide Monohydrate (SPIRIVA RESPIMAT) 1.25 MCG/ACT AERS Inhale 2 puffs into the lungs daily. 1 Inhaler 3   No current  facility-administered medications for this visit.     Allergies:   Other; Sunflower oil; and Sulfa antibiotics   Social History:  The patient  reports that he quit smoking about 11 years ago. His smoking use included cigarettes. He has a 50.00 pack-year smoking history. He has never used smokeless tobacco. He reports that he drinks about 3.6 oz of alcohol per week. He reports that he does not use drugs.   Family History:  The patient's  family history includes Heart attack (age of onset: 7) in his brother.    ROS:  Please see the history of present illness.  Otherwise, review of systems is positive for leg swelling, cough.  All other systems are reviewed and negative.     PHYSICAL EXAM: VS:  BP 126/80   Pulse 85   Ht _0  (1.905 m)   Wt 252 lb 6.4 oz (114.5 kg)   SpO2 97%   BMI 31.55 kg/m  , BMI Body mass index is 31.55 kg/m. GEN: Well nourished, well developed, in no acute distress  HEENT: normal  Neck: no JVD, no masses. No carotid bruits Cardiac: RRR without murmur or gallop                Respiratory:  clear to auscultation bilaterally, normal work of breathing GI: soft, nontender, nondistended, + BS MS: no deformity or atrophy  Ext: 1+ bilateral ankle edema, pedal pulses 2+= bilaterally Skin: warm and dry, no rash Neuro:  Strength and sensation are intact Psych: euthymic mood, full affect  EKG:  EKG is not ordered today.  Recent Labs: 08/24/2017: ALT 27; B Natriuretic Peptide 1,253.8 08/27/2017: Hemoglobin 14.3; Platelets 174 09/18/2017: BUN 17; Creatinine, Ser 1.07; Potassium 4.5; Sodium 139   Lipid Panel  No results found for: CHOL, TRIG, HDL, CHOLHDL, VLDL, LDLCALC, LDLDIRECT    Wt Readings from Last 3 Encounters:  01/15/18 252 lb 6.4 oz (114.5 kg)  01/13/18 252 lb 10.4 oz (114.6 kg)  12/30/17 253 lb 15.5 oz (115.2 kg)     Cardiac Studies Reviewed: Echo 01/05/2018: Study Conclusions  - Left ventricle: The cavity size was normal. Wall thickness was   normal. Systolic function was normal. The estimated ejection   fraction was in the range of 50% to 55%. There is hypokinesis of   the inferolateral myocardium. Doppler parameters are consistent   with abnormal left ventricular relaxation (grade 1 diastolic   dysfunction). - Ventricular septum: The contour showed diastolic flattening and   systolic flattening. - Aortic valve: There was trivial regurgitation. - Right ventricle: The cavity size was mildly dilated. Systolic   function was severely reduced. - Pulmonary arteries: PA peak pressure: 39 mm Hg (S).  Impressions:  - Hypokinesis of the inferolateral wall with overall preserved LV   systolic function; mild diastolic  dysfunction; D shaped septum   with septal bounce; trace AI; mild RVE with severely reduced RV   function.  ASSESSMENT AND PLAN: 1.  Chronic diastolic heart failure: Patient with mild edema on exam, otherwise stable New York Heart Association functional class II symptoms that are likely a combination of chronic diastolic heart failure and chronic obstructive lung disease.  Recommend increase furosemide to 40 mg daily.  Otherwise continue current medical therapy.  I reviewed his flowsheets from pulmonary rehab and his heart rate responses to exercise are not worrisome.  I would continue with his current medicines and would not recommend any specific AV nodal blocking agents.  2.  Pulmonary hypertension with right heart  failure: Echo demonstrating severe RV dysfunction.  Recommend an overnight sleep study to evaluate for obstructive sleep apnea.  The patient does report loud snoring at night.  Continue diuretic therapy.  Suspect findings are combination of chronic obstructive lung disease and LV diastolic dysfunction.  Current medicines are reviewed with the patient today.  The patient does not have concerns regarding medicines.  Labs/ tests ordered today include:   Orders Placed This Encounter  Procedures  . Split night study    Disposition:   FU 6 months APP, one year with me  Signed, Joshua Mocha, MD  01/15/2018 1:18 PM    [facility] [place], [place], [place]  [address] Phone: [phone]; Fax: [phone]

## 2018-01-15 NOTE — Patient Instructions (Signed)
Medication Instructions:  1) INCREASE LASIX to 40 mg daily  Labwork: None  Testing/Procedures: Your physician has recommended that you have a sleep study. This test records several body functions during sleep, including: brain activity, eye movement, oxygen and carbon dioxide blood levels, heart rate and rhythm, breathing rate and rhythm, the flow of air through your mouth and nose, snoring, body muscle movements, and chest and belly movement.  Follow-Up: Your provider wants you to follow-up in: 6 months with Dr. Antionette Char assistant, Nicki Reaper. You will receive a reminder letter in the mail two months in advance. If you don't receive a letter, please call our office to schedule the follow-up appointment.    Any Other Special Instructions Will Be Listed Below (If Applicable).     If you need a refill on your cardiac medications before your next appointment, please call your pharmacy.

## 2018-01-19 ENCOUNTER — Encounter (HOSPITAL_COMMUNITY): Payer: Self-pay

## 2018-01-19 NOTE — Progress Notes (Signed)
Pulmonary Individual Treatment Plan  Patient Details  Name: Joshua [name] MRN: [id number] Date of Birth: 09/16/1935 Referring Provider:     Pulmonary Rehab Walk Test from 11/04/2017 in [place]  Referring Provider  [name]       Initial Encounter Date:    Pulmonary Rehab Walk Test from 11/04/2017 in [place]  Date  11/06/17  Referring Provider  [name]       Visit Diagnosis: Centrilobular emphysema ([place])  Patient's Home Medications on Admission:   Current Outpatient Medications:  .  albuterol (PROVENTIL HFA;VENTOLIN HFA) 108 (90 Base) MCG/ACT inhaler, Inhale 1-2 puffs into the lungs every 6 (six) hours as needed for wheezing or shortness of breath., Disp: 1 Inhaler, Rfl: 3 .  allopurinol (ZYLOPRIM) 100 MG tablet, Take 100 mg by mouth daily., Disp: , Rfl:  .  aspirin 81 MG tablet, Take 81 mg by mouth daily., Disp: , Rfl:  .  Cinnamon 500 MG capsule, Take 500 mg by mouth daily., Disp: , Rfl:  .  doxazosin (CARDURA) 4 MG tablet, Take 4 mg by mouth at bedtime. Patient takes in the pm, Disp: , Rfl:  .  furosemide (LASIX) 40 MG tablet, Take 1 tablet (40 mg total) by mouth daily., Disp: 90 tablet, Rfl: 3 .  pantoprazole (PROTONIX) 40 MG tablet, Take 40 mg by mouth daily., Disp: , Rfl:  .  ramipril (ALTACE) 2.5 MG capsule, Take 2.5 mg by mouth daily., Disp: , Rfl:  .  Tiotropium Bromide Monohydrate (SPIRIVA RESPIMAT) 1.25 MCG/ACT AERS, Inhale 2 puffs into the lungs daily., Disp: 1 Inhaler, Rfl: 3  Past Medical History: Past Medical History:  Diagnosis Date  . Arthritis   . BPH (benign prostatic hyperplasia)   . COPD (chronic obstructive pulmonary disease) ([place])   . Folliculitis    posterior scalp per office visit note of Dr Maudie Mercury 07/20/2014    . GERD (gastroesophageal reflux disease)   . Hypertension   . PE (pulmonary thromboembolism) ([place])   . Phlebitis    right arm  at least 20 years ago   . Pneumonia    hx of  pneumonia as a child     Tobacco Use: Social History   Tobacco Use  Smoking Status Former Smoker  . Packs/day: 1.00  . Years: 50.00  . Pack years: 50.00  . Types: Cigarettes  . Last attempt to quit: 08/19/2006  . Years since quitting: 11.4  Smokeless Tobacco Never Used    Labs: Recent Review Flowsheet Data    There is no flowsheet data to display.      Capillary Blood Glucose: Lab Results  Component Value Date   GLUCAP 123 (H) 11/24/2008     Pulmonary Assessment Scores: Pulmonary Assessment Scores    Row Name 11/04/17 1647         ADL UCSD   ADL Phase  Entry       mMRC Score   mMRC Score  1        Pulmonary Function Assessment: Pulmonary Function Assessment - 11/03/17 1032      Breath   Bilateral Breath Sounds  Clear;Decreased    Shortness of Breath  Yes       Exercise Target Goals:    Exercise Program Goal: Individual exercise prescription set using results from initial 6 min walk test and THRR while considering  patient's activity barriers and safety.    Exercise Prescription Goal: Initial exercise prescription builds to  30-45 minutes a day of aerobic activity, 2-3 days per week.  Home exercise guidelines will be given to patient during program as part of exercise prescription that the participant will acknowledge.  Activity Barriers & Risk Stratification: Activity Barriers & Cardiac Risk Stratification - 11/03/17 1013      Activity Barriers & Cardiac Risk Stratification   Activity Barriers  Shortness of Breath;Left Knee Replacement;History of Falls left knee replacement does not interfer with activity       6 Minute Walk: 6 Minute Walk    Row Name 11/06/17 0702         6 Minute Walk   Phase  Initial     Distance  1100 feet     Walk Time  6 minutes     # of Rest Breaks  0     MPH  2.08     METS  2.53     RPE  12     Perceived Dyspnea   3     Symptoms  No     Resting HR  95 bpm     Resting BP  104/80     Resting Oxygen Saturation    97 %     Exercise Oxygen Saturation  during 6 min walk  88 %     Max Ex. HR  130 bpm     Max Ex. BP  104/64       Interval HR   1 Minute HR  104     2 Minute HR  104     3 Minute HR  96     4 Minute HR  121     5 Minute HR  129     6 Minute HR  130     2 Minute Post HR  118     Interval Heart Rate?  Yes       Interval Oxygen   Interval Oxygen?  Yes     Baseline Oxygen Saturation %  97 %     1 Minute Oxygen Saturation %  93 %     1 Minute Liters of Oxygen  0 L     2 Minute Oxygen Saturation %  93 %     2 Minute Liters of Oxygen  0 L     3 Minute Oxygen Saturation %  89 %     3 Minute Liters of Oxygen  0 L     4 Minute Oxygen Saturation %  88 %     4 Minute Liters of Oxygen  0 L     5 Minute Oxygen Saturation %  88 %     5 Minute Liters of Oxygen  0 L     6 Minute Oxygen Saturation %  88 %     6 Minute Liters of Oxygen  0 L     2 Minute Post Oxygen Saturation %  88 %     2 Minute Post Liters of Oxygen  0 L        Oxygen Initial Assessment: Oxygen Initial Assessment - 11/04/17 1646      Initial 6 min Walk   Oxygen Used  None      Program Oxygen Prescription   Program Oxygen Prescription  None       Oxygen Re-Evaluation: Oxygen Re-Evaluation    Row Name 11/21/17 1344 12/23/17 0711 01/20/18 0751 01/20/18 0752       Program Oxygen Prescription   Program Oxygen Prescription  None  None  None Patient does occasionally desaturate to 85% during exercise. I have made [name] aware of this. He has lowered his o2 sat parameters to 85%. Patient is adament on not using supplemental oxygen.   -      Home Oxygen   Home Oxygen Device  None  None  None  -    Sleep Oxygen Prescription  None  None  None  -    Home Exercise Oxygen Prescription  None  None  None  -    Home at Rest Exercise Oxygen Prescription  None  None  None  -      Goals/Expected Outcomes   Goals/Expected Outcomes  -  -  -  Education on desaturations on room air during exercise-make patient understand that  he is not to drop below 85% with exertion at home per [name].        Oxygen Discharge (Final Oxygen Re-Evaluation): Oxygen Re-Evaluation - 01/20/18 0752      Goals/Expected Outcomes   Goals/Expected Outcomes  Education on desaturations on room air during exercise-make patient understand that he is not to drop below 85% with exertion at home per [name].        Initial Exercise Prescription: Initial Exercise Prescription - 11/06/17 0700      Date of Initial Exercise RX and Referring Provider   Date  11/06/17    Referring Provider  [name]       Bike   Level  0.6    Minutes  17      NuStep   Level  2    SPM  80    Minutes  17    METs  1.5      Track   Laps  10    Minutes  17      Prescription Details   Frequency (times per week)  2    Duration  Progress to 45 minutes of aerobic exercise without signs/symptoms of physical distress      Intensity   THRR 40-80% of Max Heartrate  56-113    Ratings of Perceived Exertion  11-13    Perceived Dyspnea  0-4      Progression   Progression  Continue progressive overload as per policy without signs/symptoms or physical distress.      Resistance Training   Training Prescription  Yes    Weight  blue bands    Reps  10-15       Perform Capillary Blood Glucose checks as needed.  Exercise Prescription Changes:  Exercise Prescription Changes    Row Name 11/18/17 1200 12/02/17 1200 12/04/17 1600 12/16/17 1200 12/30/17 1200     Response to Exercise   Blood Pressure (Admit)  98/75  110/62  -  100/60  98/62   Blood Pressure (Exercise)  96/60  102/54  -  98/62  100/50   Blood Pressure (Exit)  106/69  100/60  -  118/72  98/68   Heart Rate (Admit)  85 bpm  77 bpm  -  76 bpm  95 bpm   Heart Rate (Exercise)  125 bpm  104 bpm  -  128 bpm  138 bpm   Heart Rate (Exit)  103 bpm  88 bpm  -  97 bpm  106 bpm   Oxygen Saturation (Admit)  98 %  100 %  -  99 %  95 %   Oxygen Saturation (Exercise)  89 %  89 %  -  89 %  88 %   Oxygen  Saturation (Exit)  94 %  95 %  -  95 %  93 %   Rating of Perceived Exertion (Exercise)  13  11  -  12  11   Perceived Dyspnea (Exercise)  1  1  -  2  2   Duration  Progress to 45 minutes of aerobic exercise without signs/symptoms of physical distress  Progress to 45 minutes of aerobic exercise without signs/symptoms of physical distress  -  Progress to 45 minutes of aerobic exercise without signs/symptoms of physical distress  Progress to 45 minutes of aerobic exercise without signs/symptoms of physical distress   Intensity  THRR unchanged  THRR unchanged  -  THRR unchanged  THRR unchanged     Progression   Progression  Continue to progress workloads to maintain intensity without signs/symptoms of physical distress.  Continue to progress workloads to maintain intensity without signs/symptoms of physical distress.  -  Continue to progress workloads to maintain intensity without signs/symptoms of physical distress.  Continue to progress workloads to maintain intensity without signs/symptoms of physical distress.     Resistance Training   Training Prescription  Yes  Yes  -  Yes  Yes   Weight  blue bands  blue bands  -  blue bands  blue bands   Reps  10-15  10-15  -  10-15  10-15   Time  -  -  -  10 Minutes  10 Minutes     Bike   Level  0.6  1.3  -  1.3  1.3   Minutes  17  17  -  17  17     NuStep   Level  4  5  -  6  6   SPM  80  80  -  80  80   Minutes  17  17  -  17  17   METs  2.4  2.1  -  2.6  2.1     Track   Laps  14  10  -  15  15   Minutes  17  17  -  17  17     Home Exercise Plan   Plans to continue exercise at  -  -  Home (comment)  -  -   Frequency  -  -  Add 2 additional days to program exercise sessions.  -  -   Row Name 01/13/18 1200             Response to Exercise   Blood Pressure (Admit)  98/64       Blood Pressure (Exercise)  110/58       Blood Pressure (Exit)  102/62       Heart Rate (Admit)  85 bpm       Heart Rate (Exercise)  122 bpm       Heart Rate  (Exit)  90 bpm       Oxygen Saturation (Admit)  95 %       Oxygen Saturation (Exercise)  85 %       Oxygen Saturation (Exit)  97 %       Rating of Perceived Exertion (Exercise)  11       Perceived Dyspnea (Exercise)  1       Duration  Progress to 45 minutes of aerobic exercise without signs/symptoms of physical distress       Intensity  THRR unchanged  Progression   Progression  Continue to progress workloads to maintain intensity without signs/symptoms of physical distress.         Resistance Training   Training Prescription  Yes       Weight  blue bands       Reps  10-15       Time  10 Minutes         Bike   Level  1.3       Minutes  17         NuStep   Level  6       SPM  80       Minutes  17       METs  2.9         Track   Laps  15       Minutes  17          Exercise Comments:  Exercise Comments    Row Name 12/04/17 1645           Exercise Comments  Home exercise completed          Exercise Goals and Review:  Exercise Goals    Row Name 11/03/17 1018             Exercise Goals   Increase Physical Activity  Yes       Intervention  Provide advice, education, support and counseling about physical activity/exercise needs.;Develop an individualized exercise prescription for aerobic and resistive training based on initial evaluation findings, risk stratification, comorbidities and participant's personal goals.       Expected Outcomes  Short Term: Attend rehab on a regular basis to increase amount of physical activity.;Long Term: Add in home exercise to make exercise part of routine and to increase amount of physical activity.;Long Term: Exercising regularly at least 3-5 days a week.       Increase Strength and Stamina  Yes       Intervention  Provide advice, education, support and counseling about physical activity/exercise needs.;Develop an individualized exercise prescription for aerobic and resistive training based on initial evaluation findings,  risk stratification, comorbidities and participant's personal goals.       Expected Outcomes  Short Term: Increase workloads from initial exercise prescription for resistance, speed, and METs.;Short Term: Perform resistance training exercises routinely during rehab and add in resistance training at home;Long Term: Improve cardiorespiratory fitness, muscular endurance and strength as measured by increased METs and functional capacity (6MWT)       Able to understand and use rate of perceived exertion (RPE) scale  Yes       Intervention  Provide education and explanation on how to use RPE scale       Expected Outcomes  Short Term: Able to use RPE daily in rehab to express subjective intensity level;Long Term:  Able to use RPE to guide intensity level when exercising independently       Able to understand and use Dyspnea scale  Yes       Intervention  Provide education and explanation on how to use Dyspnea scale       Expected Outcomes  Short Term: Able to use Dyspnea scale daily in rehab to express subjective sense of shortness of breath during exertion;Long Term: Able to use Dyspnea scale to guide intensity level when exercising independently       Knowledge and understanding of Target Heart Rate Range (THRR)  Yes       Intervention  Provide education  and explanation of THRR including how the numbers were predicted and where they are located for reference       Expected Outcomes  Short Term: Able to state/look up THRR;Short Term: Able to use daily as guideline for intensity in rehab;Long Term: Able to use THRR to govern intensity when exercising independently       Understanding of Exercise Prescription  Yes       Intervention  Provide education, explanation, and written materials on patient's individual exercise prescription       Expected Outcomes  Short Term: Able to explain program exercise prescription;Long Term: Able to explain home exercise prescription to exercise independently           Exercise Goals Re-Evaluation : Exercise Goals Re-Evaluation    Row Name 11/21/17 1344 12/23/17 0712 01/20/18 0753         Exercise Goal Re-Evaluation   Exercise Goals Review  Increase Physical Activity;Able to understand and use rate of perceived exertion (RPE) scale;Knowledge and understanding of Target Heart Rate Range (THRR);Understanding of Exercise Prescription;Increase Strength and Stamina;Able to understand and use Dyspnea scale  Increase Physical Activity;Able to understand and use rate of perceived exertion (RPE) scale;Knowledge and understanding of Target Heart Rate Range (THRR);Understanding of Exercise Prescription;Increase Strength and Stamina;Able to understand and use Dyspnea scale  Increase Physical Activity;Able to understand and use rate of perceived exertion (RPE) scale;Knowledge and understanding of Target Heart Rate Range (THRR);Understanding of Exercise Prescription;Increase Strength and Stamina;Able to understand and use Dyspnea scale     Comments  The patient has only attended three rehab sessions. He has shown initiative and has had workload increase. Will cont. to monitor patients and progress as able.  Patient is able to walk 15 laps (200 ft each) in 15 minutes. Patient average 2.4 METS. Will cont. to progress as able.   Patient is able to walk 16 laps (200 ft each) in 15 minutes. Patient average 2.8 METS. Patients HR was trending up and oxygen saturations were trending down with exercise. Dr. Burt Knack (cards) and [name] (pulm) both made aware. Dr. Burt Knack is not concerened with the increase heart rate since the patient is not symptomatic and [name] lowered his o2 sat guidlines to 85% with exercise since the patient is adament on not using supplemental oxygen. He lives a very active lifestyle at 82 years old. Still working. Will cont. to progress as able.     Expected Outcomes  Through exercise at rehab and at home, patient will increase physical activity, strength, and  stamina. Patient will also gain the confidence and knowledge to start an exercise regime at home and adhere to it long term.   Through exercise at rehab and at home, patient will increase physical activity, strength, and stamina. Patient will also gain the confidence and knowledge to start an exercise regime at home and adhere to it long term.   Through exercise at rehab and at home, patient will increase physical activity, strength, and stamina. Patient will also gain the confidence and knowledge to start an exercise regime at home and adhere to it long term.         Discharge Exercise Prescription (Final Exercise Prescription Changes): Exercise Prescription Changes - 01/13/18 1200      Response to Exercise   Blood Pressure (Admit)  98/64    Blood Pressure (Exercise)  110/58    Blood Pressure (Exit)  102/62    Heart Rate (Admit)  85 bpm    Heart Rate (Exercise)  122 bpm    Heart Rate (Exit)  90 bpm    Oxygen Saturation (Admit)  95 %    Oxygen Saturation (Exercise)  85 %    Oxygen Saturation (Exit)  97 %    Rating of Perceived Exertion (Exercise)  11    Perceived Dyspnea (Exercise)  1    Duration  Progress to 45 minutes of aerobic exercise without signs/symptoms of physical distress    Intensity  THRR unchanged      Progression   Progression  Continue to progress workloads to maintain intensity without signs/symptoms of physical distress.      Resistance Training   Training Prescription  Yes    Weight  blue bands    Reps  10-15    Time  10 Minutes      Bike   Level  1.3    Minutes  17      NuStep   Level  6    SPM  80    Minutes  17    METs  2.9      Track   Laps  15    Minutes  17       Nutrition:  Target Goals: Understanding of nutrition guidelines, daily intake of sodium <1570m, cholesterol <2069m calories 30% from fat and 7% or less from saturated fats, daily to have 5 or more servings of fruits and vegetables.  Biometrics: Pre Biometrics - 01/13/18 1213       Pre Biometrics   Weight  252 lb 10.4 oz (114.6 kg)    BMI (Calculated)  31.58        Nutrition Therapy Plan and Nutrition Goals: Nutrition Therapy & Goals - 12/02/17 1206      Nutrition Therapy   Diet  General, Healthful      Personal Nutrition Goals   Nutrition Goal  Pt to stop using No-Salt or Nu-Salt and consider using salt sparingly.    Personal Goal #2  Pt to increase his vegetable consumption by adding a vegetable to lunch and/or dinner at least 3 days/week.       Intervention Plan   Intervention  Prescribe, educate and counsel regarding individualized specific dietary modifications aiming towards targeted core components such as weight, hypertension, lipid management, diabetes, heart failure and other comorbidities.    Expected Outcomes  Short Term Goal: Understand basic principles of dietary content, such as calories, fat, sodium, cholesterol and nutrients.;Long Term Goal: Adherence to prescribed nutrition plan.       Nutrition Assessments: Nutrition Assessments - 12/02/17 1206      Rate Your Plate Scores   Pre Score  43       Nutrition Goals Re-Evaluation:   Nutrition Goals Discharge (Final Nutrition Goals Re-Evaluation):   Psychosocial: Target Goals: Acknowledge presence or absence of significant depression and/or stress, maximize coping skills, provide positive support system. Participant is able to verbalize types and ability to use techniques and skills needed for reducing stress and depression.  Initial Review & Psychosocial Screening: Initial Psych Review & Screening - 11/03/17 1033      Initial Review   Current issues with  None Identified      Family Dynamics   Good Support System?  Yes      Barriers   Psychosocial barriers to participate in program  There are no identifiable barriers or psychosocial needs.      Screening Interventions   Interventions  Encouraged to exercise       Quality of Life Scores:  Scores of 19 and below usually  indicate a poorer quality of life in these areas.  A difference of  2-3 points is a clinically meaningful difference.  A difference of 2-3 points in the total score of the Quality of Life Index has been associated with significant improvement in overall quality of life, self-image, physical symptoms, and general health in studies assessing change in quality of life.   PHQ-9: Recent Review Flowsheet Data    Depression screen [facility] 2/9 11/03/2017   Decreased Interest 0   Down, Depressed, Hopeless 0   PHQ - 2 Score 0     Interpretation of Total Score  Total Score Depression Severity:  1-4 = Minimal depression, 5-9 = Mild depression, 10-14 = Moderate depression, 15-19 = Moderately severe depression, 20-27 = Severe depression   Psychosocial Evaluation and Intervention: Psychosocial Evaluation - 11/03/17 1033      Psychosocial Evaluation & Interventions   Interventions  Encouraged to exercise with the program and follow exercise prescription    Expected Outcomes  patient will remain free from psychosocial barriers to participation in pulmonary rehab    Continue Psychosocial Services   No Follow up required       Psychosocial Re-Evaluation: Psychosocial Re-Evaluation    [place] Name 11/24/17 1023 12/22/17 1218 01/19/18 1340         Psychosocial Re-Evaluation   Current issues with  None Identified  None Identified  None Identified     Comments  -  -  Pt demonstrates positive and healthy coping skills.  Pt often seen interacting positively with fellow participants.     Expected Outcomes  patient will remain free from psychosocial barriers to pulmonary rehab participation  patient will remain free from psychosocial barriers to pulmonary rehab participation  patient will remain free from psychosocial barriers to pulmonary rehab participation     Interventions  Encouraged to attend Pulmonary Rehabilitation for the exercise  Encouraged to attend Pulmonary Rehabilitation for the exercise  Encouraged to  attend Pulmonary Rehabilitation for the exercise     Continue Psychosocial Services   Follow up required by staff  No Follow up required  No Follow up required        Psychosocial Discharge (Final Psychosocial Re-Evaluation): Psychosocial Re-Evaluation - 01/19/18 1340      Psychosocial Re-Evaluation   Current issues with  None Identified    Comments  Pt demonstrates positive and healthy coping skills.  Pt often seen interacting positively with fellow participants.    Expected Outcomes  patient will remain free from psychosocial barriers to pulmonary rehab participation    Interventions  Encouraged to attend Pulmonary Rehabilitation for the exercise    Continue Psychosocial Services   No Follow up required       Education: Education Goals: Education classes will be provided on a weekly basis, covering required topics. Participant will state understanding/return demonstration of topics presented.  Learning Barriers/Preferences: Learning Barriers/Preferences - 11/03/17 1032      Learning Barriers/Preferences   Learning Barriers  None    Learning Preferences  Verbal Instruction;Written Material;Group Instruction;Individual Instruction       Education Topics: Risk Factor Reduction:  -Group instruction that is supported by a PowerPoint presentation. Instructor discusses the definition of a risk factor, different risk factors for pulmonary disease, and how the heart and lungs work together.     Nutrition for Pulmonary Patient:  -Group instruction provided by PowerPoint slides, verbal discussion, and written materials to support subject matter. The instructor gives an explanation and  review of healthy diet recommendations, which includes a discussion on weight management, recommendations for fruit and vegetable consumption, as well as protein, fluid, caffeine, fiber, sodium, sugar, and alcohol. Tips for eating when patients are short of breath are discussed.   PULMONARY REHAB OTHER  RESPIRATORY from 01/15/2018 in [place]  Date  01/01/18  Educator  edna  Instruction Review Code  2- Demonstrated Understanding      Pursed Lip Breathing:  -Group instruction that is supported by demonstration and informational handouts. Instructor discusses the benefits of pursed lip and diaphragmatic breathing and detailed demonstration on how to preform both.     Oxygen Safety:  -Group instruction provided by PowerPoint, verbal discussion, and written material to support subject matter. There is an overview of "What is Oxygen" and "Why do we need it".  Instructor also reviews how to create a safe environment for oxygen use, the importance of using oxygen as prescribed, and the risks of noncompliance. There is a brief discussion on traveling with oxygen and resources the patient may utilize.   PULMONARY REHAB OTHER RESPIRATORY from 01/15/2018 in [place]  Date  12/04/17  Educator  Lucianne Lei Shell  Instruction Review Code  2- Demonstrated Understanding      Oxygen Equipment:  -Group instruction provided by [facility] Staff utilizing handouts, written materials, and equipment demonstrations.   PULMONARY REHAB OTHER RESPIRATORY from 01/15/2018 in [place]  Date  12/18/17  Educator  [place]  Instruction Review Code  2- Demonstrated Understanding      Signs and Symptoms:  -Group instruction provided by written material and verbal discussion to support subject matter. Warning signs and symptoms of infection, stroke, and heart attack are reviewed and when to call the physician/911 reinforced. Tips for preventing the spread of infection discussed.   Advanced Directives:  -Group instruction provided by verbal instruction and written material to support subject matter. Instructor reviews Advanced Directive laws and proper instruction for filling out document.   Pulmonary Video:   -Group video education that reviews the importance of medication and oxygen compliance, exercise, good nutrition, pulmonary hygiene, and pursed lip and diaphragmatic breathing for the pulmonary patient.   Exercise for the Pulmonary Patient:  -Group instruction that is supported by a PowerPoint presentation. Instructor discusses benefits of exercise, core components of exercise, frequency, duration, and intensity of an exercise routine, importance of utilizing pulse oximetry during exercise, safety while exercising, and options of places to exercise outside of rehab.     PULMONARY REHAB OTHER RESPIRATORY from 01/15/2018 in [place]  Date  12/25/17  Educator  EP  Instruction Review Code  1- Verbalizes Understanding      Pulmonary Medications:  -Verbally interactive group education provided by instructor with focus on inhaled medications and proper administration.   Anatomy and Physiology of the Respiratory System and Intimacy:  -Group instruction provided by PowerPoint, verbal discussion, and written material to support subject matter. Instructor reviews respiratory cycle and anatomical components of the respiratory system and their functions. Instructor also reviews differences in obstructive and restrictive respiratory diseases with examples of each. Intimacy, Sex, and Sexuality differences are reviewed with a discussion on how relationships can change when diagnosed with pulmonary disease. Common sexual concerns are reviewed.   PULMONARY REHAB OTHER RESPIRATORY from 01/15/2018 in [place]  Date  01/15/18  Educator  rn  Instruction Review Code  2- Demonstrated  Understanding      MD DAY -A group question and answer session with a medical doctor that allows participants to ask questions that relate to their pulmonary disease state.   PULMONARY REHAB OTHER RESPIRATORY from 01/15/2018 in [place]  Date  11/27/17  Educator  Dr. Nelda Marseille  Instruction Review Code  1- Verbalizes Understanding      OTHER EDUCATION -Group or individual verbal, written, or video instructions that support the educational goals of the pulmonary rehab program.   PULMONARY REHAB OTHER RESPIRATORY from 01/15/2018 in [place]  Date  12/11/17 [Beat a sedentary lifestyle]  Educator  Kem Boroughs Nolic  Instruction Review Code  1- Verbalizes Understanding      Holiday Eating Survival Tips:  -Group instruction provided by PowerPoint slides, verbal discussion, and written materials to support subject matter. The instructor gives patients tips, tricks, and techniques to help them not only survive but enjoy the holidays despite the onslaught of food that accompanies the holidays.   Knowledge Questionnaire Score:   Core Components/Risk Factors/Patient Goals at Admission: Personal Goals and Risk Factors at Admission - 11/03/17 1033      Core Components/Risk Factors/Patient Goals on Admission   Improve shortness of breath with ADL's  Yes    Intervention  Provide education, individualized exercise plan and daily activity instruction to help decrease symptoms of SOB with activities of daily living.    Expected Outcomes  Short Term: Improve cardiorespiratory fitness to achieve a reduction of symptoms when performing ADLs;Long Term: Be able to perform more ADLs without symptoms or delay the onset of symptoms    Heart Failure  Yes    Intervention  Provide a combined exercise and nutrition program that is supplemented with education, support and counseling about heart failure. Directed toward relieving symptoms such as shortness of breath, decreased exercise tolerance, and extremity edema.    Expected Outcomes  Improve functional capacity of life;Short term: Attendance in program 2-3 days a week with increased exercise capacity. Reported lower sodium intake. Reported increased fruit  and vegetable intake. Reports medication compliance.;Short term: Daily weights obtained and reported for increase. Utilizing diuretic protocols set by physician.;Long term: Adoption of self-care skills and reduction of barriers for early signs and symptoms recognition and intervention leading to self-care maintenance.       Core Components/Risk Factors/Patient Goals Review:  Goals and Risk Factor Review    Row Name 11/24/17 1019 12/22/17 1212 01/19/18 1337         Core Components/Risk Factors/Patient Goals Review   Personal Goals Review  Heart Failure;Improve shortness of breath with ADL's;Develop more efficient breathing techniques such as purse lipped breathing and diaphragmatic breathing and practicing self-pacing with activity.  Heart Failure;Improve shortness of breath with ADL's;Develop more efficient breathing techniques such as purse lipped breathing and diaphragmatic breathing and practicing self-pacing with activity.  Improve shortness of breath with ADL's;Develop more efficient breathing techniques such as purse lipped breathing and diaphragmatic breathing and practicing self-pacing with activity.     Review  patient is doing well in pulmonary rehab. he has only attended 3 session since admission but in those 3 sesions it is evident that he is aware of HF self care. he makes comments such as "that is not what I weighed this morning" which leads me to believe he is weighing himself daily as expected of a HF patient. He is beginning to use PRB independently however he needs cueing most of the time.  he states he is enjoying the program and has even reconnected to an old friend who is also attending pulmonary rehab. expect to see greater progression towards pulmonary rehab goals over the next 30 days.  Patient continues to do well. he states his shortness of breath has improved during exertion. he is using pursed lip breathing and is learning to pace himself when he walks on the track. he is able  to walk 14-17 laps in 15 minutes with minimal shortness of breath and no restbreaks. will continue to monitor progression towards goals over next 30 days. he is compliant with all HF home care including daily weights and verbalizes importance of low NA diet and daily weights as well as when to report weights to MD. This HF goal is met and will not be addressed during next 30 day unless needed.  Patient continues to do well. he states his shortness of breath has improved during exertion. he is using pursed lip breathing and is learning to pace himself when he walks on the track. he is able to walk 13-16 laps in 15 minutes with minimal shortness of breath and no restbreaks. will continue to monitor progression towards goals over next 30 days.      Expected Outcomes  see "admission expected outcomes"  see "admission expected outcomes"  see "admission expected outcomes"        Core Components/Risk Factors/Patient Goals at Discharge (Final Review):  Goals and Risk Factor Review - 01/19/18 1337      Core Components/Risk Factors/Patient Goals Review   Personal Goals Review  Improve shortness of breath with ADL's;Develop more efficient breathing techniques such as purse lipped breathing and diaphragmatic breathing and practicing self-pacing with activity.    Review  Patient continues to do well. he states his shortness of breath has improved during exertion. he is using pursed lip breathing and is learning to pace himself when he walks on the track. he is able to walk 13-16 laps in 15 minutes with minimal shortness of breath and no restbreaks. will continue to monitor progression towards goals over next 30 days.     Expected Outcomes  see "admission expected outcomes"       ITP Comments: ITP Comments    Row Name 12/22/17 1219 01/21/18 1037         ITP Comments  Dr. Jennet Maduro, Medical Director  Dr. Jennet Maduro, Medical Director         Comments: Pt has attended 18 exercise sessions. Cherre Huger, BSN Cardiac and Training and development officer

## 2018-01-20 ENCOUNTER — Encounter (HOSPITAL_COMMUNITY)
Admission: RE | Admit: 2018-01-20 | Discharge: 2018-01-20 | Disposition: A | Discharge status: Home / Self Care | Payer: Medicare Other | Source: Ambulatory Visit | Attending: Pulmonary Disease | Admitting: Pulmonary Disease

## 2018-01-20 DIAGNOSIS — J432 Centrilobular emphysema: Secondary | ICD-10-CM | POA: Diagnosis not present

## 2018-01-20 NOTE — Progress Notes (Signed)
Daily Session Note  Patient Details  Name: Joshua [name] MRN: [id number] Date of Birth: 11/18/1935 Referring Provider:     Pulmonary Rehab Walk Test from 11/04/2017 in [place]  Referring Provider  [name]       Encounter Date: 01/20/2018  Check In: Session Check In - 01/20/18 1217      Check-In   Location  [facility]    Staff Present  Su Hilt, MS, ACSM RCEP, Exercise Physiologist;Carlette Wilber Oliphant, Therapist, sports, BSN;Ramon Dredge, RN, [facility]    Supervising physician immediately available to respond to emergencies  [facility] immediately available    Physician(s)   Dr. Jonnie Finner    Medication changes reported      No    Fall or balance concerns reported     No    Tobacco Cessation  No Change    Warm-up and Cool-down  Performed as group-led instruction    Resistance Training Performed  Yes    VAD Patient?  No      Pain Assessment   Currently in Pain?  No/denies    Multiple Pain Sites  No       Capillary Blood Glucose: No results found for this or any previous visit (from the past 24 hour(s)).    Social History   Tobacco Use  Smoking Status Former Smoker  . Packs/day: 1.00  . Years: 50.00  . Pack years: 50.00  . Types: Cigarettes  . Last attempt to quit: 08/19/2006  . Years since quitting: 11.4  Smokeless Tobacco Never Used    Goals Met:  Exercise tolerated well No report of cardiac concerns or symptoms Strength training completed today  Goals Unmet:  Not Applicable  Comments: Service time is from 10:30a to 12:10p    [name] is Medical Director for Pulmonary Rehab at [facility].

## 2018-01-21 ENCOUNTER — Telehealth: Payer: Self-pay | Admitting: *Deleted

## 2018-01-21 NOTE — Telephone Encounter (Signed)
Per [name]. With [employer] since patient's MCR is primary insurance no PA is required. Staff message was sent to [name] ok to schedule.

## 2018-01-21 NOTE — Telephone Encounter (Signed)
-----  Message from Theodoro Parma, RN sent at 01/15/2018 10:43 AM EDT ----- Regarding: sleep study ordered Sleep study ordered for precert/scheduling.  Thanks!  Valetta Fuller

## 2018-01-22 ENCOUNTER — Encounter (HOSPITAL_COMMUNITY)
Admission: RE | Admit: 2018-01-22 | Discharge: 2018-01-22 | Disposition: A | Discharge status: Home / Self Care | Payer: Medicare Other | Source: Ambulatory Visit | Attending: Pulmonary Disease | Admitting: Pulmonary Disease

## 2018-01-22 ENCOUNTER — Telehealth: Payer: Self-pay | Admitting: *Deleted

## 2018-01-22 DIAGNOSIS — J432 Centrilobular emphysema: Secondary | ICD-10-CM | POA: Diagnosis not present

## 2018-01-22 NOTE — Telephone Encounter (Signed)
-----  Message from Lauralee Evener, [place] sent at 01/21/2018  3:36 PM EDT ----- Regarding: RE: sleep study ordered Per [employer] patient's MCR is primary therefore a PA is not required. Ok to schedule. ----- Message ----- From: Theodoro Parma, RN Sent: 01/15/2018  10:43 AM To: Freada Bergeron, CMA, Theodoro Parma, RN, # Subject: sleep study ordered                            Sleep study ordered for precert/scheduling.  Thanks!  Valetta Fuller

## 2018-01-22 NOTE — Progress Notes (Signed)
Daily Session Note  Patient Details  Name: Joshua [name] MRN: [id number] Date of Birth: Mar 12, 1936 Referring Provider:     Pulmonary Rehab Walk Test from 11/04/2017 in [place]  Referring Provider  [name]       Encounter Date: 01/22/2018  Check In: Session Check In - 01/22/18 1120      Check-In   Location  [facility]    Staff Present  Su Hilt, MS, ACSM RCEP, Exercise Physiologist;Carlette Wilber Oliphant, Therapist, sports, BSN;Ramon Dredge, RN, MHA;Lisa Ysidro Evert, RN    Supervising physician immediately available to respond to emergencies  [facility] immediately available    Physician(s)  [name]    Medication changes reported      No    Fall or balance concerns reported     No    Tobacco Cessation  No Change    Warm-up and Cool-down  Performed as group-led instruction    Resistance Training Performed  Yes    VAD Patient?  No      Pain Assessment   Currently in Pain?  No/denies    Multiple Pain Sites  No       Capillary Blood Glucose: No results found for this or any previous visit (from the past 24 hour(s)).    Social History   Tobacco Use  Smoking Status Former Smoker  . Packs/day: 1.00  . Years: 50.00  . Pack years: 50.00  . Types: Cigarettes  . Last attempt to quit: 08/19/2006  . Years since quitting: 11.4  Smokeless Tobacco Never Used    Goals Met:  Exercise tolerated well No report of cardiac concerns or symptoms Strength training completed today  Goals Unmet:  Not Applicable  Comments: Service time is from 10:30a to 12:30p    [name] is Medical Director for Pulmonary Rehab at [facility].

## 2018-01-22 NOTE — Telephone Encounter (Signed)
Patient is scheduled for lab study on 02/14/18. Patient understands his sleep study will be done at [facility] sleep lab. Patient understands he will receive a sleep packet in a week or so. Patient understands to call if he does not receive the sleep packet in a timely manner.  Left detailed message on voicemail with date and time of titration and informed patient to call back to confirm or reschedule.

## 2018-01-22 NOTE — Progress Notes (Signed)
Daily Session Note  Patient Details  Name: Joshua [name] MRN: [id number] Date of Birth: [birth year] Referring Provider:     Pulmonary Rehab Walk Test from 11/04/2017 in [place]  Referring Provider  [name]       Encounter Date: 01/22/2018  Check In: Session Check In - 01/22/18 1120      Check-In   Location  [facility]    Staff Present  Su Hilt, MS, ACSM RCEP, Exercise Physiologist;Carlette Wilber Oliphant, Therapist, sports, BSN;Ramon Dredge, RN, MHA;Lisa Ysidro Evert, RN    Supervising physician immediately available to respond to emergencies  [facility] immediately available    Physician(s)  [name]    Medication changes reported      No    Fall or balance concerns reported     No    Tobacco Cessation  No Change    Warm-up and Cool-down  Performed as group-led instruction    Resistance Training Performed  Yes    VAD Patient?  No      Pain Assessment   Currently in Pain?  No/denies    Multiple Pain Sites  No       Capillary Blood Glucose: No results found for this or any previous visit (from the past 24 hour(s)).    Social History   Tobacco Use  Smoking Status Former Smoker  . Packs/day: 1.00  . Years: 50.00  . Pack years: 50.00  . Types: Cigarettes  . Last attempt to quit: 08/19/2006  . Years since quitting: 11.4  Smokeless Tobacco Never Used    Goals Met:  Exercise tolerated well No report of cardiac concerns or symptoms Strength training completed today  Goals Unmet:  Not Applicable  Comments: Service time is from 10:30A to 12:30P    [name] is Medical Director for Pulmonary Rehab at [facility].

## 2018-01-27 ENCOUNTER — Encounter (HOSPITAL_COMMUNITY)
Admission: RE | Admit: 2018-01-27 | Discharge: 2018-01-27 | Disposition: A | Discharge status: Home / Self Care | Payer: Medicare Other | Source: Ambulatory Visit | Attending: Pulmonary Disease | Admitting: Pulmonary Disease

## 2018-01-27 VITALS — Wt 247.8 lb

## 2018-01-27 DIAGNOSIS — J432 Centrilobular emphysema: Secondary | ICD-10-CM

## 2018-01-27 NOTE — Progress Notes (Signed)
Daily Session Note  Patient Details  Name: Joshua [name] MRN: [id number] Date of Birth: Jul 11, 1936 Referring Provider:     Pulmonary Rehab Walk Test from 11/04/2017 in [place]  Referring Provider  [name]       Encounter Date: 01/27/2018  Check In: Session Check In - 01/27/18 1030      Check-In   Location  [facility]    Staff Present  Rosebud Poles, RN, BSN;Molly DiVincenzo, MS, ACSM RCEP, Exercise Physiologist;Lisa Ysidro Evert, RN;Carlette Carlton, RN, Deland Pretty, MS, ACSM CEP, Exercise Physiologist;Annedrea Rosezella [place], RN, [facility]    Supervising physician immediately available to respond to emergencies  [facility] immediately available    Physician(s)  [name]    Medication changes reported      No    Fall or balance concerns reported     No    Tobacco Cessation  No Change    Warm-up and Cool-down  Performed as group-led instruction    Resistance Training Performed  Yes    VAD Patient?  No      Pain Assessment   Currently in Pain?  No/denies    Multiple Pain Sites  No       Capillary Blood Glucose: No results found for this or any previous visit (from the past 24 hour(s)).  Exercise Prescription Changes - 01/27/18 1400      Response to Exercise   Blood Pressure (Admit)  104/62    Blood Pressure (Exercise)  108/72    Blood Pressure (Exit)  90/50    Heart Rate (Admit)  88 bpm    Heart Rate (Exercise)  121 bpm    Heart Rate (Exit)  95 bpm    Oxygen Saturation (Admit)  99 %    Oxygen Saturation (Exercise)  88 %    Oxygen Saturation (Exit)  97 %    Rating of Perceived Exertion (Exercise)  11    Perceived Dyspnea (Exercise)  1    Duration  Progress to 45 minutes of aerobic exercise without signs/symptoms of physical distress    Intensity  THRR unchanged      Progression   Progression  Continue to progress workloads to maintain intensity without signs/symptoms of physical distress.      Resistance Training    Training Prescription  Yes    Weight  blue bands    Reps  10-15    Time  10 Minutes      Interval Training   Interval Training  No      Bike   Level  1.3    Minutes  17      NuStep   Level  6    SPM  80    Minutes  17    METs  2.8      Track   Laps  16    Minutes  17       Social History   Tobacco Use  Smoking Status Former Smoker  . Packs/day: 1.00  . Years: 50.00  . Pack years: 50.00  . Types: Cigarettes  . Last attempt to quit: 08/19/2006  . Years since quitting: 11.4  Smokeless Tobacco Never Used    Goals Met:  Exercise tolerated well Strength training completed today  Goals Unmet:  Not Applicable  Comments: Service time is from 1030 to 1225    [name] is Medical Director for Pulmonary Rehab at [facility].

## 2018-01-29 ENCOUNTER — Encounter (HOSPITAL_COMMUNITY)
Admission: RE | Admit: 2018-01-29 | Discharge: 2018-01-29 | Disposition: A | Discharge status: Home / Self Care | Payer: Medicare Other | Source: Ambulatory Visit | Attending: Pulmonary Disease | Admitting: Pulmonary Disease

## 2018-01-29 DIAGNOSIS — J432 Centrilobular emphysema: Secondary | ICD-10-CM

## 2018-01-29 NOTE — Progress Notes (Signed)
Daily Session Note  Patient Details  Name: Joshua [name] MRN: [id number] Date of Birth: Sep 25, 1935 Referring Provider:     Pulmonary Rehab Walk Test from 11/04/2017 in [place]  Referring Provider  [name]       Encounter Date: 01/29/2018  Check In: Session Check In - 01/29/18 1030      Check-In   Location  [facility]    Staff Present  Rosebud Poles, RN, BSN;Molly DiVincenzo, MS, ACSM RCEP, Exercise Physiologist;Lisa Ysidro Evert, RN;Carlette Wilber Oliphant, RN, BSN;Ramon Dredge, RN, [facility]    Supervising physician immediately available to respond to emergencies  [facility] immediately available    Physician(s)  Dr. Broadus John    Medication changes reported      No    Fall or balance concerns reported     No    Tobacco Cessation  No Change    Warm-up and Cool-down  Performed as group-led instruction    Resistance Training Performed  Yes    VAD Patient?  No      Pain Assessment   Currently in Pain?  No/denies    Multiple Pain Sites  No       Capillary Blood Glucose: No results found for this or any previous visit (from the past 24 hour(s)).    Social History   Tobacco Use  Smoking Status Former Smoker  . Packs/day: 1.00  . Years: 50.00  . Pack years: 50.00  . Types: Cigarettes  . Last attempt to quit: 08/19/2006  . Years since quitting: 11.4  Smokeless Tobacco Never Used    Goals Met:  Exercise tolerated well Strength training completed today  Goals Unmet:  Not Applicable  Comments: Service time is from 1030 to 1215    [name] is Medical Director for Pulmonary Rehab at [facility].

## 2018-02-03 ENCOUNTER — Encounter (HOSPITAL_COMMUNITY): Payer: Medicare Other

## 2018-02-05 ENCOUNTER — Encounter (HOSPITAL_COMMUNITY): Payer: Medicare Other

## 2018-02-10 ENCOUNTER — Encounter (HOSPITAL_COMMUNITY)
Admission: RE | Admit: 2018-02-10 | Discharge: 2018-02-10 | Disposition: A | Discharge status: Home / Self Care | Payer: Medicare Other | Source: Ambulatory Visit | Attending: Pulmonary Disease | Admitting: Pulmonary Disease

## 2018-02-10 VITALS — Wt 250.0 lb

## 2018-02-10 DIAGNOSIS — J432 Centrilobular emphysema: Secondary | ICD-10-CM

## 2018-02-10 NOTE — Progress Notes (Signed)
Daily Session Note  Patient Details  Name: Joshua [name] MRN: [id number] Date of Birth: August 07, 1936 Referring Provider:     Pulmonary Rehab Walk Test from 11/04/2017 in [place]  Referring Provider  [name]       Encounter Date: 02/10/2018  Check In: Session Check In - 02/10/18 1030      Check-In   Location  [facility]    Staff Present  Rosebud Poles, RN, BSN;Molly DiVincenzo, MS, ACSM RCEP, Exercise Physiologist;Lisa Ysidro Evert, Felipe Drone, RN, [facility]    Supervising physician immediately available to respond to emergencies  [facility] immediately available    Physician(s)  Dr. Herbert Moors    Medication changes reported      No    Fall or balance concerns reported     No    Tobacco Cessation  No Change    Warm-up and Cool-down  Performed as group-led instruction    Resistance Training Performed  Yes    VAD Patient?  No    PAD/SET Patient?  No      Pain Assessment   Currently in Pain?  No/denies    Multiple Pain Sites  No       Capillary Blood Glucose: No results found for this or any previous visit (from the past 24 hour(s)).  Exercise Prescription Changes - 02/10/18 1200      Response to Exercise   Blood Pressure (Admit)  104/64    Blood Pressure (Exercise)  120/70    Blood Pressure (Exit)  90/50    Heart Rate (Admit)  83 bpm    Heart Rate (Exercise)  107 bpm    Heart Rate (Exit)  85 bpm    Oxygen Saturation (Admit)  97 %    Oxygen Saturation (Exercise)  85 %    Oxygen Saturation (Exit)  98 %    Rating of Perceived Exertion (Exercise)  11    Perceived Dyspnea (Exercise)  1    Duration  Progress to 45 minutes of aerobic exercise without signs/symptoms of physical distress    Intensity  THRR unchanged      Progression   Progression  Continue to progress workloads to maintain intensity without signs/symptoms of physical distress.      Resistance Training   Training Prescription  Yes    Weight  blue  bands    Reps  10-15    Time  10 Minutes      Interval Training   Interval Training  No      Bike   Level  1.4    Minutes  17      NuStep   Level  7    SPM  80    Minutes  7      Track   Laps  17    Minutes  17       Social History   Tobacco Use  Smoking Status Former Smoker  . Packs/day: 1.00  . Years: 50.00  . Pack years: 50.00  . Types: Cigarettes  . Last attempt to quit: 08/19/2006  . Years since quitting: 11.4  Smokeless Tobacco Never Used    Goals Met:  Exercise tolerated well Strength training completed today  Goals Unmet:  Not Applicable  Comments: Service time is from 1030 to 1220    [name] is Medical Director for Pulmonary Rehab at [facility].

## 2018-02-11 ENCOUNTER — Other Ambulatory Visit: Payer: Self-pay | Admitting: Pulmonary Disease

## 2018-02-12 ENCOUNTER — Encounter (HOSPITAL_COMMUNITY)
Admission: RE | Admit: 2018-02-12 | Discharge: 2018-02-12 | Disposition: A | Discharge status: Home / Self Care | Payer: Medicare Other | Source: Ambulatory Visit | Attending: Pulmonary Disease | Admitting: Pulmonary Disease

## 2018-02-12 DIAGNOSIS — J432 Centrilobular emphysema: Secondary | ICD-10-CM

## 2018-02-12 NOTE — Progress Notes (Signed)
Daily Session Note  Patient Details  Name: Joshua [name] MRN: [id number] Date of Birth: 03-12-1936 Referring Provider:     Pulmonary Rehab Walk Test from 11/04/2017 in [place]  Referring Provider  [name]       Encounter Date: 02/12/2018  Check In: Session Check In - 02/12/18 1115      Check-In   Location  [facility]    Staff Present  Rosebud Poles, RN, BSN;Molly DiVincenzo, MS, ACSM RCEP, Exercise Physiologist;Lisa Ysidro Evert, Felipe Drone, RN, [facility]    Supervising physician immediately available to respond to emergencies  [facility] immediately available    Physician(s)  Dr. Bonner Puna    Medication changes reported      No    Fall or balance concerns reported     No    Tobacco Cessation  No Change    Warm-up and Cool-down  Performed as group-led instruction    Resistance Training Performed  Yes    VAD Patient?  No    PAD/SET Patient?  No      Pain Assessment   Currently in Pain?  No/denies    Multiple Pain Sites  No       Capillary Blood Glucose: No results found for this or any previous visit (from the past 24 hour(s)).    Social History   Tobacco Use  Smoking Status Former Smoker  . Packs/day: 1.00  . Years: 50.00  . Pack years: 50.00  . Types: Cigarettes  . Last attempt to quit: 08/19/2006  . Years since quitting: 11.4  Smokeless Tobacco Never Used    Goals Met:  Exercise tolerated well Strength training completed today  Goals Unmet:  Not Applicable  Comments: Service time is from 1115 to 1205    [name] is Medical Director for Pulmonary Rehab at [facility].

## 2018-02-14 ENCOUNTER — Encounter (HOSPITAL_BASED_OUTPATIENT_CLINIC_OR_DEPARTMENT_OTHER): Payer: Medicare Other

## 2018-02-17 ENCOUNTER — Encounter (HOSPITAL_COMMUNITY)
Admission: RE | Admit: 2018-02-17 | Discharge: 2018-02-17 | Disposition: A | Discharge status: Home / Self Care | Payer: Medicare Other | Source: Ambulatory Visit | Attending: Pulmonary Disease | Admitting: Pulmonary Disease

## 2018-02-17 DIAGNOSIS — J432 Centrilobular emphysema: Secondary | ICD-10-CM | POA: Insufficient documentation

## 2018-02-17 NOTE — Progress Notes (Signed)
Pulmonary Individual Treatment Plan  Patient Details  Name: Joshua [name] MRN: [id number] Date of Birth: 05-11-1936 Referring Provider:     Pulmonary Rehab Walk Test from 11/04/2017 in [place]  Referring Provider  [name]       Initial Encounter Date:    Pulmonary Rehab Walk Test from 11/04/2017 in [place]  Date  11/06/17      Visit Diagnosis: Centrilobular emphysema ([place])  Patient's Home Medications on Admission:   Current Outpatient Medications:  .  albuterol (PROVENTIL HFA;VENTOLIN HFA) 108 (90 Base) MCG/ACT inhaler, Inhale 1-2 puffs into the lungs every 6 (six) hours as needed for wheezing or shortness of breath., Disp: 1 Inhaler, Rfl: 3 .  allopurinol (ZYLOPRIM) 100 MG tablet, Take 100 mg by mouth daily., Disp: , Rfl:  .  aspirin 81 MG tablet, Take 81 mg by mouth daily., Disp: , Rfl:  .  Cinnamon 500 MG capsule, Take 500 mg by mouth daily., Disp: , Rfl:  .  doxazosin (CARDURA) 4 MG tablet, Take 4 mg by mouth at bedtime. Patient takes in the pm, Disp: , Rfl:  .  furosemide (LASIX) 40 MG tablet, Take 1 tablet (40 mg total) by mouth daily., Disp: 90 tablet, Rfl: 3 .  pantoprazole (PROTONIX) 40 MG tablet, Take 40 mg by mouth daily., Disp: , Rfl:  .  ramipril (ALTACE) 2.5 MG capsule, Take 2.5 mg by mouth daily., Disp: , Rfl:  .  SPIRIVA RESPIMAT 1.25 MCG/ACT AERS, INHALE 2 PUFFS INTO THE LUNGS DAILY., Disp: 1 Inhaler, Rfl: 3  Past Medical History: Past Medical History:  Diagnosis Date  . Arthritis   . BPH (benign prostatic hyperplasia)   . COPD (chronic obstructive pulmonary disease) ([place])   . Folliculitis    posterior scalp per office visit note of Dr Maudie Mercury 07/20/2014    . GERD (gastroesophageal reflux disease)   . Hypertension   . PE (pulmonary thromboembolism) ([place])   . Phlebitis    right arm  at least 20 years ago   . Pneumonia    hx of pneumonia as a child     Tobacco Use: Social History    Tobacco Use  Smoking Status Former Smoker  . Packs/day: 1.00  . Years: 50.00  . Pack years: 50.00  . Types: Cigarettes  . Last attempt to quit: 08/19/2006  . Years since quitting: 11.5  Smokeless Tobacco Never Used    Labs: Recent Review Flowsheet Data    There is no flowsheet data to display.      Capillary Blood Glucose: Lab Results  Component Value Date   GLUCAP 123 (H) 11/24/2008     Pulmonary Assessment Scores: Pulmonary Assessment Scores    Row Name 11/04/17 1647 02/12/18 1609       ADL UCSD   ADL Phase  Entry  Exit    SOB Score total  -  19      CAT Score   CAT Score  -  pre 11, post 9      mMRC Score   mMRC Score  1  -       Pulmonary Function Assessment: Pulmonary Function Assessment - 11/03/17 1032      Breath   Bilateral Breath Sounds  Clear;Decreased    Shortness of Breath  Yes       Exercise Target Goals:    Exercise Program Goal: Individual exercise prescription set using results from initial 6 min walk test and  THRR while considering  patient's activity barriers and safety.   Exercise Prescription Goal: Initial exercise prescription builds to 30-45 minutes a day of aerobic activity, 2-3 days per week.  Home exercise guidelines will be given to patient during program as part of exercise prescription that the participant will acknowledge.  Activity Barriers & Risk Stratification: Activity Barriers & Cardiac Risk Stratification - 11/03/17 1013      Activity Barriers & Cardiac Risk Stratification   Activity Barriers  Shortness of Breath;Left Knee Replacement;History of Falls left knee replacement does not interfer with activity       6 Minute Walk: 6 Minute Walk    Row Name 11/06/17 0702         6 Minute Walk   Phase  Initial     Distance  1100 feet     Walk Time  6 minutes     # of Rest Breaks  0     MPH  2.08     METS  2.53     RPE  12     Perceived Dyspnea   3     Symptoms  No     Resting HR  95 bpm     Resting BP   104/80     Resting Oxygen Saturation   97 %     Exercise Oxygen Saturation  during 6 min walk  88 %     Max Ex. HR  130 bpm     Max Ex. BP  104/64       Interval HR   1 Minute HR  104     2 Minute HR  104     3 Minute HR  96     4 Minute HR  121     5 Minute HR  129     6 Minute HR  130     2 Minute Post HR  118     Interval Heart Rate?  Yes       Interval Oxygen   Interval Oxygen?  Yes     Baseline Oxygen Saturation %  97 %     1 Minute Oxygen Saturation %  93 %     1 Minute Liters of Oxygen  0 L     2 Minute Oxygen Saturation %  93 %     2 Minute Liters of Oxygen  0 L     3 Minute Oxygen Saturation %  89 %     3 Minute Liters of Oxygen  0 L     4 Minute Oxygen Saturation %  88 %     4 Minute Liters of Oxygen  0 L     5 Minute Oxygen Saturation %  88 %     5 Minute Liters of Oxygen  0 L     6 Minute Oxygen Saturation %  88 %     6 Minute Liters of Oxygen  0 L     2 Minute Post Oxygen Saturation %  88 %     2 Minute Post Liters of Oxygen  0 L        Oxygen Initial Assessment: Oxygen Initial Assessment - 11/04/17 1646      Initial 6 min Walk   Oxygen Used  None      Program Oxygen Prescription   Program Oxygen Prescription  None       Oxygen Re-Evaluation: Oxygen Re-Evaluation    Row Name 11/21/17 1344 12/23/17 0711 01/20/18 0751  01/20/18 0752 02/13/18 1027     Program Oxygen Prescription   Program Oxygen Prescription  None  None  None Patient does occasionally desaturate to 85% during exercise. I have made [name] aware of this. He has lowered his o2 sat parameters to 85%. Patient is adament on not using supplemental oxygen.   -  None     Home Oxygen   Home Oxygen Device  None  None  None  -  None   Sleep Oxygen Prescription  None  None  None  -  None   Home Exercise Oxygen Prescription  None  None  None  -  None   Home at Rest Exercise Oxygen Prescription  None  None  None  -  None     Goals/Expected Outcomes   Goals/Expected Outcomes  -  -  -  Education  on desaturations on room air during exercise-make patient understand that he is not to drop below 85% with exertion at home per [name].   Education on desaturations on room air during exercise-make patient understand that he is not to drop below 85% with exertion at home per [name].       Oxygen Discharge (Final Oxygen Re-Evaluation): Oxygen Re-Evaluation - 02/13/18 1027      Program Oxygen Prescription   Program Oxygen Prescription  None      Home Oxygen   Home Oxygen Device  None    Sleep Oxygen Prescription  None    Home Exercise Oxygen Prescription  None    Home at Rest Exercise Oxygen Prescription  None      Goals/Expected Outcomes   Goals/Expected Outcomes  Education on desaturations on room air during exercise-make patient understand that he is not to drop below 85% with exertion at home per [name].        Initial Exercise Prescription: Initial Exercise Prescription - 11/06/17 0700      Date of Initial Exercise RX and Referring Provider   Date  11/06/17    Referring Provider  [name]       Bike   Level  0.6    Minutes  17      NuStep   Level  2    SPM  80    Minutes  17    METs  1.5      Track   Laps  10    Minutes  17      Prescription Details   Frequency (times per week)  2    Duration  Progress to 45 minutes of aerobic exercise without signs/symptoms of physical distress      Intensity   THRR 40-80% of Max Heartrate  56-113    Ratings of Perceived Exertion  11-13    Perceived Dyspnea  0-4      Progression   Progression  Continue progressive overload as per policy without signs/symptoms or physical distress.      Resistance Training   Training Prescription  Yes    Weight  blue bands    Reps  10-15       Perform Capillary Blood Glucose checks as needed.  Exercise Prescription Changes: Exercise Prescription Changes    Row Name 11/18/17 1200 12/02/17 1200 12/04/17 1600 12/16/17 1200 12/30/17 1200     Response to Exercise   Blood  Pressure (Admit)  98/75  110/62  -  100/60  98/62   Blood Pressure (Exercise)  96/60  102/54  -  98/62  100/50  Blood Pressure (Exit)  106/69  100/60  -  118/72  98/68   Heart Rate (Admit)  85 bpm  77 bpm  -  76 bpm  95 bpm   Heart Rate (Exercise)  125 bpm  104 bpm  -  128 bpm  138 bpm   Heart Rate (Exit)  103 bpm  88 bpm  -  97 bpm  106 bpm   Oxygen Saturation (Admit)  98 %  100 %  -  99 %  95 %   Oxygen Saturation (Exercise)  89 %  89 %  -  89 %  88 %   Oxygen Saturation (Exit)  94 %  95 %  -  95 %  93 %   Rating of Perceived Exertion (Exercise)  13  11  -  12  11   Perceived Dyspnea (Exercise)  1  1  -  2  2   Duration  Progress to 45 minutes of aerobic exercise without signs/symptoms of physical distress  Progress to 45 minutes of aerobic exercise without signs/symptoms of physical distress  -  Progress to 45 minutes of aerobic exercise without signs/symptoms of physical distress  Progress to 45 minutes of aerobic exercise without signs/symptoms of physical distress   Intensity  THRR unchanged  THRR unchanged  -  THRR unchanged  THRR unchanged     Progression   Progression  Continue to progress workloads to maintain intensity without signs/symptoms of physical distress.  Continue to progress workloads to maintain intensity without signs/symptoms of physical distress.  -  Continue to progress workloads to maintain intensity without signs/symptoms of physical distress.  Continue to progress workloads to maintain intensity without signs/symptoms of physical distress.     Resistance Training   Training Prescription  Yes  Yes  -  Yes  Yes   Weight  blue bands  blue bands  -  blue bands  blue bands   Reps  10-15  10-15  -  10-15  10-15   Time  -  -  -  10 Minutes  10 Minutes     Bike   Level  0.6  1.3  -  1.3  1.3   Minutes  17  17  -  17  17     NuStep   Level  4  5  -  6  6   SPM  80  80  -  80  80   Minutes  17  17  -  17  17   METs  2.4  2.1  -  2.6  2.1     Track   Laps  14  10   -  15  15   Minutes  17  17  -  17  17     Home Exercise Plan   Plans to continue exercise at  -  -  Home (comment)  -  -   Frequency  -  -  Add 2 additional days to program exercise sessions.  -  -   Row Name 01/13/18 1200 01/27/18 1400 02/10/18 1200         Response to Exercise   Blood Pressure (Admit)  98/64  104/62  104/64     Blood Pressure (Exercise)  110/58  108/72  120/70     Blood Pressure (Exit)  102/62  90/50  90/50     Heart Rate (Admit)  85 bpm  88 bpm  83 bpm  Heart Rate (Exercise)  122 bpm  121 bpm  107 bpm     Heart Rate (Exit)  90 bpm  95 bpm  85 bpm     Oxygen Saturation (Admit)  95 %  99 %  97 %     Oxygen Saturation (Exercise)  85 %  88 %  85 %     Oxygen Saturation (Exit)  97 %  97 %  98 %     Rating of Perceived Exertion (Exercise)  _0 Perceived Dyspnea (Exercise)  _1 Duration  Progress to 45 minutes of aerobic exercise without signs/symptoms of physical distress  Progress to 45 minutes of aerobic exercise without signs/symptoms of physical distress  Progress to 45 minutes of aerobic exercise without signs/symptoms of physical distress     Intensity  THRR unchanged  THRR unchanged  THRR unchanged       Progression   Progression  Continue to progress workloads to maintain intensity without signs/symptoms of physical distress.  Continue to progress workloads to maintain intensity without signs/symptoms of physical distress.  Continue to progress workloads to maintain intensity without signs/symptoms of physical distress.       Resistance Training   Training Prescription  Yes  Yes  Yes     Weight  blue bands  blue bands  blue bands     Reps  10-15  10-15  10-15     Time  10 Minutes  10 Minutes  10 Minutes       Interval Training   Interval Training  -  No  No       Bike   Level  1.3  1.3  1.4     Minutes  _2 NuStep   Level  _3 SPM  80  80  80     Minutes  _4 METs  2.9  2.8  -       Track    Laps  _5 Minutes  _6 Exercise Comments: Exercise Comments    Row Name 12/04/17 1645           Exercise Comments  Home exercise completed          Exercise Goals and Review: Exercise Goals    Row Name 11/03/17 1018             Exercise Goals   Increase Physical Activity  Yes       Intervention  Provide advice, education, support and counseling about physical activity/exercise needs.;Develop an individualized exercise prescription for aerobic and resistive training based on initial evaluation findings, risk stratification, comorbidities and participant's personal goals.       Expected Outcomes  Short Term: Attend rehab on a regular basis to increase amount of physical activity.;Long Term: Add in home exercise to make exercise part of routine and to increase amount of physical activity.;Long Term: Exercising regularly at least 3-5 days a week.       Increase Strength and Stamina  Yes       Intervention  Provide advice, education, support and counseling about physical activity/exercise needs.;Develop an individualized exercise prescription for aerobic and resistive training based on initial evaluation findings,  risk stratification, comorbidities and participant's personal goals.       Expected Outcomes  Short Term: Increase workloads from initial exercise prescription for resistance, speed, and METs.;Short Term: Perform resistance training exercises routinely during rehab and add in resistance training at home;Long Term: Improve cardiorespiratory fitness, muscular endurance and strength as measured by increased METs and functional capacity (6MWT)       Able to understand and use rate of perceived exertion (RPE) scale  Yes       Intervention  Provide education and explanation on how to use RPE scale       Expected Outcomes  Short Term: Able to use RPE daily in rehab to express subjective intensity level;Long Term:  Able to use RPE to guide intensity level when  exercising independently       Able to understand and use Dyspnea scale  Yes       Intervention  Provide education and explanation on how to use Dyspnea scale       Expected Outcomes  Short Term: Able to use Dyspnea scale daily in rehab to express subjective sense of shortness of breath during exertion;Long Term: Able to use Dyspnea scale to guide intensity level when exercising independently       Knowledge and understanding of Target Heart Rate Range (THRR)  Yes       Intervention  Provide education and explanation of THRR including how the numbers were predicted and where they are located for reference       Expected Outcomes  Short Term: Able to state/look up THRR;Short Term: Able to use daily as guideline for intensity in rehab;Long Term: Able to use THRR to govern intensity when exercising independently       Understanding of Exercise Prescription  Yes       Intervention  Provide education, explanation, and written materials on patient's individual exercise prescription       Expected Outcomes  Short Term: Able to explain program exercise prescription;Long Term: Able to explain home exercise prescription to exercise independently          Exercise Goals Re-Evaluation : Exercise Goals Re-Evaluation    Row Name 11/21/17 1344 12/23/17 3810 01/20/18 0753 02/13/18 1027       Exercise Goal Re-Evaluation   Exercise Goals Review  Increase Physical Activity;Able to understand and use rate of perceived exertion (RPE) scale;Knowledge and understanding of Target Heart Rate Range (THRR);Understanding of Exercise Prescription;Increase Strength and Stamina;Able to understand and use Dyspnea scale  Increase Physical Activity;Able to understand and use rate of perceived exertion (RPE) scale;Knowledge and understanding of Target Heart Rate Range (THRR);Understanding of Exercise Prescription;Increase Strength and Stamina;Able to understand and use Dyspnea scale  Increase Physical Activity;Able to understand  and use rate of perceived exertion (RPE) scale;Knowledge and understanding of Target Heart Rate Range (THRR);Understanding of Exercise Prescription;Increase Strength and Stamina;Able to understand and use Dyspnea scale  Increase Physical Activity;Able to understand and use rate of perceived exertion (RPE) scale;Knowledge and understanding of Target Heart Rate Range (THRR);Understanding of Exercise Prescription;Increase Strength and Stamina;Able to understand and use Dyspnea scale    Comments  The patient has only attended three rehab sessions. He has shown initiative and has had workload increase. Will cont. to monitor patients and progress as able.  Patient is able to walk 15 laps (200 ft each) in 15 minutes. Patient average 2.4 METS. Will cont. to progress as able.   Patient is able to walk 16 laps (200 ft each) in 15 minutes.  Patient average 2.8 METS. Patients HR was trending up and oxygen saturations were trending down with exercise. Dr. Burt Knack (cards) and [name] (pulm) both made aware. Dr. Burt Knack is not concerened with the increase heart rate since the patient is not symptomatic and [name] lowered his o2 sat guidlines to 85% with exercise since the patient is adament on not using supplemental oxygen. He lives a very active lifestyle at 82 years old. Still working. Will cont. to progress as able.  Patient is able to walk 18 laps (200 ft each) in 15 minutes. Patient average 2.8 METS. Patients HR was trending up and oxygen saturations were trending down with exercise. Dr. Burt Knack (cards) and [name] (pulm) both made aware. Dr. Burt Knack is not concerened with the increase heart rate since the patient is not symptomatic and [name] lowered his o2 sat guidlines to 85% with exercise since the patient is adament on not using supplemental oxygen. He lives a very active lifestyle at 82 years old. Still working. Will cont. to progress as able.    Expected Outcomes  Through exercise at rehab and at home, patient will  increase physical activity, strength, and stamina. Patient will also gain the confidence and knowledge to start an exercise regime at home and adhere to it long term.   Through exercise at rehab and at home, patient will increase physical activity, strength, and stamina. Patient will also gain the confidence and knowledge to start an exercise regime at home and adhere to it long term.   Through exercise at rehab and at home, patient will increase physical activity, strength, and stamina. Patient will also gain the confidence and knowledge to start an exercise regime at home and adhere to it long term.   Through exercise at rehab and at home, patient will increase physical activity, strength, and stamina. Patient will also gain the confidence and knowledge to start an exercise regime at home and adhere to it long term.        Discharge Exercise Prescription (Final Exercise Prescription Changes): Exercise Prescription Changes - 02/10/18 1200      Response to Exercise   Blood Pressure (Admit)  104/64    Blood Pressure (Exercise)  120/70    Blood Pressure (Exit)  90/50    Heart Rate (Admit)  83 bpm    Heart Rate (Exercise)  107 bpm    Heart Rate (Exit)  85 bpm    Oxygen Saturation (Admit)  97 %    Oxygen Saturation (Exercise)  85 %    Oxygen Saturation (Exit)  98 %    Rating of Perceived Exertion (Exercise)  11    Perceived Dyspnea (Exercise)  1    Duration  Progress to 45 minutes of aerobic exercise without signs/symptoms of physical distress    Intensity  THRR unchanged      Progression   Progression  Continue to progress workloads to maintain intensity without signs/symptoms of physical distress.      Resistance Training   Training Prescription  Yes    Weight  blue bands    Reps  10-15    Time  10 Minutes      Interval Training   Interval Training  No      Bike   Level  1.4    Minutes  17      NuStep   Level  7    SPM  80    Minutes  17      Track  Laps  17    Minutes  17        Nutrition:  Target Goals: Understanding of nutrition guidelines, daily intake of sodium <1568m, cholesterol <2038m calories 30% from fat and 7% or less from saturated fats, daily to have 5 or more servings of fruits and vegetables.  Biometrics: Pre Biometrics - 01/13/18 1213      Pre Biometrics   Weight  252 lb 10.4 oz (114.6 kg)    BMI (Calculated)  31.58        Nutrition Therapy Plan and Nutrition Goals: Nutrition Therapy & Goals - 12/02/17 1206      Nutrition Therapy   Diet  General, Healthful      Personal Nutrition Goals   Nutrition Goal  Pt to stop using No-Salt or Nu-Salt and consider using salt sparingly.    Personal Goal #2  Pt to increase his vegetable consumption by adding a vegetable to lunch and/or dinner at least 3 days/week.       Intervention Plan   Intervention  Prescribe, educate and counsel regarding individualized specific dietary modifications aiming towards targeted core components such as weight, hypertension, lipid management, diabetes, heart failure and other comorbidities.    Expected Outcomes  Short Term Goal: Understand basic principles of dietary content, such as calories, fat, sodium, cholesterol and nutrients.;Long Term Goal: Adherence to prescribed nutrition plan.       Nutrition Assessments: Nutrition Assessments - 02/13/18 0827      Rate Your Plate Scores   Pre Score  43    Post Score  41       Nutrition Goals Re-Evaluation:   Nutrition Goals Discharge (Final Nutrition Goals Re-Evaluation):   Psychosocial: Target Goals: Acknowledge presence or absence of significant depression and/or stress, maximize coping skills, provide positive support system. Participant is able to verbalize types and ability to use techniques and skills needed for reducing stress and depression.  Initial Review & Psychosocial Screening: Initial Psych Review & Screening - 11/03/17 1033      Initial Review   Current issues with  None Identified       Family Dynamics   Good Support System?  Yes      Barriers   Psychosocial barriers to participate in program  There are no identifiable barriers or psychosocial needs.      Screening Interventions   Interventions  Encouraged to exercise       Quality of Life Scores:  Scores of 19 and below usually indicate a poorer quality of life in these areas.  A difference of  2-3 points is a clinically meaningful difference.  A difference of 2-3 points in the total score of the Quality of Life Index has been associated with significant improvement in overall quality of life, self-image, physical symptoms, and general health in studies assessing change in quality of life.   PHQ-9: Recent Review Flowsheet Data    [facility]   Decreased Interest 0   Down, Depressed, Hopeless 0   PHQ - 2 Score 0     Interpretation of Total Score  Total Score Depression Severity:  1-4 = Minimal depression, 5-9 = Mild depression, 10-14 = Moderate depression, 15-19 = Moderately severe depression, 20-27 = Severe depression   Psychosocial Evaluation and Intervention: Psychosocial Evaluation - 11/03/17 1033      Psychosocial Evaluation & Interventions   Interventions  Encouraged to exercise with the program and follow exercise prescription    Expected Outcomes  patient will remain free  from psychosocial barriers to participation in pulmonary rehab    Continue Psychosocial Services   No Follow up required       Psychosocial Re-Evaluation: Psychosocial Re-Evaluation    [place] Name 11/24/17 1023 12/22/17 1218 01/19/18 1340 02/16/18 1356       Psychosocial Re-Evaluation   Current issues with  None Identified  None Identified  None Identified  None Identified    Comments  -  -  Pt demonstrates positive and healthy coping skills.  Pt often seen interacting positively with fellow participants.  Pt demonstrates positive and healthy coping skills.  Pt often seen interacting positively with  fellow participants.    Expected Outcomes  patient will remain free from psychosocial barriers to pulmonary rehab participation  patient will remain free from psychosocial barriers to pulmonary rehab participation  patient will remain free from psychosocial barriers to pulmonary rehab participation  patient will remain free from psychosocial barriers to pulmonary rehab participation    Interventions  Encouraged to attend Pulmonary Rehabilitation for the exercise  Encouraged to attend Pulmonary Rehabilitation for the exercise  Encouraged to attend Pulmonary Rehabilitation for the exercise  Encouraged to attend Pulmonary Rehabilitation for the exercise    Continue Psychosocial Services   Follow up required by staff  No Follow up required  No Follow up required  No Follow up required       Psychosocial Discharge (Final Psychosocial Re-Evaluation): Psychosocial Re-Evaluation - 02/16/18 1356      Psychosocial Re-Evaluation   Current issues with  None Identified    Comments  Pt demonstrates positive and healthy coping skills.  Pt often seen interacting positively with fellow participants.    Expected Outcomes  patient will remain free from psychosocial barriers to pulmonary rehab participation    Interventions  Encouraged to attend Pulmonary Rehabilitation for the exercise    Continue Psychosocial Services   No Follow up required        Education: Education Goals: Education classes will be provided on a weekly basis, covering required topics. Participant will state understanding/return demonstration of topics presented.  Learning Barriers/Preferences: Learning Barriers/Preferences - 11/03/17 1032      Learning Barriers/Preferences   Learning Barriers  None    Learning Preferences  Verbal Instruction;Written Material;Group Instruction;Individual Instruction       Education Topics: How Lungs Work and Diseases: - Discuss the anatomy of the lungs and diseases that can affect the lungs, such  as COPD.   Exercise: -Discuss the importance of exercise, FITT principles of exercise, normal and abnormal responses to exercise, and how to exercise safely.   Environmental Irritants: -Discuss types of environmental irritants and how to limit exposure to environmental irritants.   Meds/Inhalers and oxygen: - Discuss respiratory medications, definition of an inhaler and oxygen, and the proper way to use an inhaler and oxygen.   Energy Saving Techniques: - Discuss methods to conserve energy and decrease shortness of breath when performing activities of daily living.    Bronchial Hygiene / Breathing Techniques: - Discuss breathing mechanics, pursed-lip breathing technique,  proper posture, effective ways to clear airways, and other functional breathing techniques   Cleaning Equipment: - Provides group verbal and written instruction about the health risks of elevated stress, cause of high stress, and healthy ways to reduce stress.   Nutrition I: Fats: - Discuss the types of cholesterol, what cholesterol does to the body, and how cholesterol levels can be controlled.   Nutrition II: Labels: -Discuss the different components of food labels and how  to read food labels.   Respiratory Infections: - Discuss the signs and symptoms of respiratory infections, ways to prevent respiratory infections, and the importance of seeking medical treatment when having a respiratory infection.   Stress I: Signs and Symptoms: - Discuss the causes of stress, how stress may lead to anxiety and depression, and ways to limit stress.   Stress II: Relaxation: -Discuss relaxation techniques to limit stress.   Oxygen for Home/Travel: - Discuss how to prepare for travel when on oxygen and proper ways to transport and store oxygen to ensure safety.   Knowledge Questionnaire Score: Knowledge Questionnaire Score - 02/12/18 1608      Knowledge Questionnaire Score   Post Score  17/18       Core  Components/Risk Factors/Patient Goals at Admission: Personal Goals and Risk Factors at Admission - 11/03/17 1033      Core Components/Risk Factors/Patient Goals on Admission   Improve shortness of breath with ADL's  Yes    Intervention  Provide education, individualized exercise plan and daily activity instruction to help decrease symptoms of SOB with activities of daily living.    Expected Outcomes  Short Term: Improve cardiorespiratory fitness to achieve a reduction of symptoms when performing ADLs;Long Term: Be able to perform more ADLs without symptoms or delay the onset of symptoms    Heart Failure  Yes    Intervention  Provide a combined exercise and nutrition program that is supplemented with education, support and counseling about heart failure. Directed toward relieving symptoms such as shortness of breath, decreased exercise tolerance, and extremity edema.    Expected Outcomes  Improve functional capacity of life;Short term: Attendance in program 2-3 days a week with increased exercise capacity. Reported lower sodium intake. Reported increased fruit and vegetable intake. Reports medication compliance.;Short term: Daily weights obtained and reported for increase. Utilizing diuretic protocols set by physician.;Long term: Adoption of self-care skills and reduction of barriers for early signs and symptoms recognition and intervention leading to self-care maintenance.       Core Components/Risk Factors/Patient Goals Review:  Goals and Risk Factor Review    Row Name 11/24/17 1019 12/22/17 1212 01/19/18 1337 02/16/18 1354       Core Components/Risk Factors/Patient Goals Review   Personal Goals Review  Heart Failure;Improve shortness of breath with ADL's;Develop more efficient breathing techniques such as purse lipped breathing and diaphragmatic breathing and practicing self-pacing with activity.  Heart Failure;Improve shortness of breath with ADL's;Develop more efficient breathing techniques  such as purse lipped breathing and diaphragmatic breathing and practicing self-pacing with activity.  Improve shortness of breath with ADL's;Develop more efficient breathing techniques such as purse lipped breathing and diaphragmatic breathing and practicing self-pacing with activity.  Heart Failure;Improve shortness of breath with ADL's    Review  patient is doing well in pulmonary rehab. he has only attended 3 session since admission but in those 3 sesions it is evident that he is aware of HF self care. he makes comments such as "that is not what I weighed this morning" which leads me to believe he is weighing himself daily as expected of a HF patient. He is beginning to use PRB independently however he needs cueing most of the time. he states he is enjoying the program and has even reconnected to an old friend who is also attending pulmonary rehab. expect to see greater progression towards pulmonary rehab goals over the next 30 days.  Patient continues to do well. he states his shortness of breath has improved  during exertion. he is using pursed lip breathing and is learning to pace himself when he walks on the track. he is able to walk 14-17 laps in 15 minutes with minimal shortness of breath and no restbreaks. will continue to monitor progression towards goals over next 30 days. he is compliant with all HF home care including daily weights and verbalizes importance of low NA diet and daily weights as well as when to report weights to MD. This HF goal is met and will not be addressed during next 30 day unless needed.  Patient continues to do well. he states his shortness of breath has improved during exertion. he is using pursed lip breathing and is learning to pace himself when he walks on the track. he is able to walk 13-16 laps in 15 minutes with minimal shortness of breath and no restbreaks. will continue to monitor progression towards goals over next 30 days.   Close to graduating,walking up to 18 laps in  17 minutes, level 6 on nustep, and level 1.4 on bicycle    Expected Outcomes  see "admission expected outcomes"  see "admission expected outcomes"  see "admission expected outcomes"  see "admission expected outcomes"       Core Components/Risk Factors/Patient Goals at Discharge (Final Review):  Goals and Risk Factor Review - 02/16/18 1354      Core Components/Risk Factors/Patient Goals Review   Personal Goals Review  Heart Failure;Improve shortness of breath with ADL's    Review  Close to graduating,walking up to 18 laps in 17 minutes, level 6 on nustep, and level 1.4 on bicycle    Expected Outcomes  see "admission expected outcomes"       ITP Comments: ITP Comments    Row Name 12/22/17 1219 01/21/18 1037         ITP Comments  Dr. Jennet Maduro, Medical Director  Dr. Jennet Maduro, Medical Director         Comments: ITP REVIEW Pt is making expected progress toward pulmonary rehab goals after completing 23 sessions. Recommend continued exercise, life style modification, education, and utilization of breathing techniques to increase stamina and strength and decrease shortness of breath with exertion.

## 2018-02-24 ENCOUNTER — Encounter (HOSPITAL_COMMUNITY)
Admission: RE | Admit: 2018-02-24 | Discharge: 2018-02-24 | Disposition: A | Discharge status: Home / Self Care | Payer: Medicare Other | Source: Ambulatory Visit | Attending: Pulmonary Disease | Admitting: Pulmonary Disease

## 2018-02-24 VITALS — Wt 250.2 lb

## 2018-02-24 DIAGNOSIS — J432 Centrilobular emphysema: Secondary | ICD-10-CM | POA: Diagnosis not present

## 2018-02-24 NOTE — Progress Notes (Signed)
Daily Session Note  Patient Details  Name: Joshua [name] MRN: [id number] Date of Birth: January 19, 1936 Referring Provider:     Pulmonary Rehab Walk Test from 11/04/2017 in [place]  Referring Provider  [name]       Encounter Date: 02/24/2018  Check In: Session Check In - 02/24/18 1030      Check-In   Location  [facility]    Staff Present  Rosebud Poles, RN, BSN;Carlette Wilber Oliphant, RN, BSN;Lisa Ysidro Evert, Felipe Drone, RN, [facility]    Supervising physician immediately available to respond to emergencies  [facility] immediately available    Physician(s)  Dr. Verlon Au    Medication changes reported      No    Fall or balance concerns reported     No    Tobacco Cessation  No Change    Warm-up and Cool-down  Performed as group-led instruction    Resistance Training Performed  Yes    VAD Patient?  No    PAD/SET Patient?  No      Pain Assessment   Currently in Pain?  No/denies    Multiple Pain Sites  No       Capillary Blood Glucose: No results found for this or any previous visit (from the past 24 hour(s)).  Exercise Prescription Changes - 02/24/18 1300      Response to Exercise   Blood Pressure (Admit)  100/64    Blood Pressure (Exercise)  110/60    Blood Pressure (Exit)  90/60    Heart Rate (Admit)  78 bpm    Heart Rate (Exercise)  97 bpm    Heart Rate (Exit)  77 bpm    Oxygen Saturation (Admit)  98 %    Oxygen Saturation (Exercise)  91 %    Oxygen Saturation (Exit)  95 %    Rating of Perceived Exertion (Exercise)  12    Perceived Dyspnea (Exercise)  1    Duration  Progress to 45 minutes of aerobic exercise without signs/symptoms of physical distress    Intensity  THRR unchanged      Progression   Progression  Continue to progress workloads to maintain intensity without signs/symptoms of physical distress.      Resistance Training   Training Prescription  Yes    Weight  blue bands    Reps  10-15    Time  10  Minutes      Interval Training   Interval Training  No      Bike   Level  1.4    Minutes  17      NuStep   Level  6    SPM  80    Minutes  17    METs  2.3      Track   Laps  17    Minutes  17       Social History   Tobacco Use  Smoking Status Former Smoker  . Packs/day: 1.00  . Years: 50.00  . Pack years: 50.00  . Types: Cigarettes  . Last attempt to quit: 08/19/2006  . Years since quitting: 11.5  Smokeless Tobacco Never Used    Goals Met:  Exercise tolerated well Strength training completed today  Goals Unmet:  Not Applicable  Comments: Service time is from 1030 to 1210    [name] is Medical Director for Pulmonary Rehab at [facility].

## 2018-02-26 ENCOUNTER — Encounter (HOSPITAL_COMMUNITY)
Admission: RE | Admit: 2018-02-26 | Discharge: 2018-02-26 | Disposition: A | Discharge status: Home / Self Care | Payer: Medicare Other | Source: Ambulatory Visit | Attending: Pulmonary Disease | Admitting: Pulmonary Disease

## 2018-02-26 VITALS — Wt 250.7 lb

## 2018-02-26 DIAGNOSIS — J432 Centrilobular emphysema: Secondary | ICD-10-CM | POA: Diagnosis not present

## 2018-02-26 NOTE — Progress Notes (Signed)
Daily Session Note  Patient Details  Name: Joshua [name] MRN: [id number] Date of Birth: 1936/05/16 Referring Provider:     Pulmonary Rehab Walk Test from 11/04/2017 in [place]  Referring Provider  [name]       Encounter Date: 02/26/2018  Check In: Session Check In - 02/26/18 1030      Check-In   Location  [facility]    Staff Present  Rosebud Poles, RN, BSN;Carlette Wilber Oliphant, RN, BSN;Lisa Ysidro Evert, Felipe Drone, RN, [facility]    Supervising physician immediately available to respond to emergencies  [facility] immediately available    Physician(s)  Dr. Broadus John    Medication changes reported      No    Fall or balance concerns reported     No    Tobacco Cessation  No Change    Warm-up and Cool-down  Performed as group-led instruction    Resistance Training Performed  Yes    VAD Patient?  No    PAD/SET Patient?  No      Pain Assessment   Currently in Pain?  No/denies    Multiple Pain Sites  No       Capillary Blood Glucose: No results found for this or any previous visit (from the past 24 hour(s)).    Social History   Tobacco Use  Smoking Status Former Smoker  . Packs/day: 1.00  . Years: 50.00  . Pack years: 50.00  . Types: Cigarettes  . Last attempt to quit: 08/19/2006  . Years since quitting: 11.5  Smokeless Tobacco Never Used    Goals Met:  Exercise tolerated well Strength training completed today  Goals Unmet:  Not Applicable  Comments: Service time is from 1030 to 1225   [name] is Medical Director for Pulmonary Rehab at [facility].

## 2018-03-03 ENCOUNTER — Encounter (HOSPITAL_COMMUNITY)
Admission: RE | Admit: 2018-03-03 | Discharge: 2018-03-03 | Disposition: A | Discharge status: Home / Self Care | Payer: Medicare Other | Source: Ambulatory Visit | Attending: Pulmonary Disease | Admitting: Pulmonary Disease

## 2018-03-03 DIAGNOSIS — J432 Centrilobular emphysema: Secondary | ICD-10-CM

## 2018-03-13 ENCOUNTER — Ambulatory Visit (HOSPITAL_BASED_OUTPATIENT_CLINIC_OR_DEPARTMENT_OTHER): Payer: Medicare Other | Attending: Cardiovascular Disease | Admitting: Cardiology

## 2018-03-13 VITALS — Ht 74.0 in | Wt 250.0 lb

## 2018-03-13 DIAGNOSIS — G4734 Idiopathic sleep related nonobstructive alveolar hypoventilation: Secondary | ICD-10-CM | POA: Diagnosis not present

## 2018-03-13 DIAGNOSIS — I272 Pulmonary hypertension, unspecified: Secondary | ICD-10-CM | POA: Diagnosis not present

## 2018-03-13 DIAGNOSIS — R0683 Snoring: Secondary | ICD-10-CM | POA: Diagnosis not present

## 2018-03-14 NOTE — Procedures (Signed)
   Patient Name: Casmere, Hollenbeck Study Date:08/05/2017 03/13/2018 Gender: Male D.O.B: 22-Feb-1936 Age (years): 35 Referring Provider: Sherren Mocha Height (inches): 21 Interpreting Physician: Fransico Him MD, ABSM Weight (lbs): 250 RPSGT: Baxter Flattery BMI: 34 MRN: [id number] Neck Size: 19.00  CLINICAL INFORMATION Sleep Study Type: NPSG  Indication for sleep study: Fatigue, Snoring, Witnesses Apnea / Gasping During Sleep  Epworth Sleepiness Score: 9  SLEEP STUDY TECHNIQUE As per the AASM Manual for the Scoring of Sleep and Associated Events v2.3 (April 2016) with a hypopnea requiring 4% desaturations.  The channels recorded and monitored were frontal, central and occipital EEG, electrooculogram (EOG), submentalis EMG (chin), nasal and oral airflow, thoracic and abdominal wall motion, anterior tibialis EMG, snore microphone, electrocardiogram, and pulse oximetry.  MEDICATIONS Medications self-administered by patient taken the night of the study : N/A  SLEEP ARCHITECTURE The study was initiated at 10:31:56 PM and ended at 5:11:02 AM.  Sleep onset time was 10.7 minutes and the sleep efficiency was 84.8%%. The total sleep time was 338.4 minutes.  Stage REM latency was 46.0 minutes.  The patient spent 4.4%% of the night in stage N1 sleep, 68.3%% in stage N2 sleep, 0.0%% in stage N3 and 27.34% in REM.  Alpha intrusion was absent.  Supine sleep was 26.95%.  RESPIRATORY PARAMETERS The overall apnea/hypopnea index (AHI) was 2.0 per hour. There were 11 total apneas, including 11 obstructive, 0 central and 0 mixed apneas. There were 0 hypopneas and 2 RERAs.  The AHI during Stage REM sleep was 1.3 per hour.  AHI while supine was 2.6 per hour.  The mean oxygen saturation was 92.4%. The minimum SpO2 during sleep was 79.0%.  moderate snoring was noted during this study.  CARDIAC DATA The 2 lead EKG demonstrated sinus rhythm. The mean heart rate was 64.1 beats per minute. Other  EKG findings include: PVCs.  LEG MOVEMENT DATA The total PLMS were 0 with a resulting PLMS index of 0.0. Associated arousal with leg movement index was 0.0 .  IMPRESSIONS - No significant obstructive sleep apnea occurred during this study (AHI = 2.0/h). - No significant central sleep apnea occurred during this study (CAI = 0.0/h). - Moderate oxygen desaturation was noted during this study (Min O2 = 79.0%). - The patient snored with moderate snoring volume. - PVCs were noted during this study. - Clinically significant periodic limb movements did not occur during sleep. No significant associated arousals.  DIAGNOSIS - Nocturnal Hypoxemia (327.26 [G47.36 ICD-10])  RECOMMENDATIONS - Oxygen at 1L [place] nightly - Avoid alcohol, sedatives and other CNS depressants that may worsen sleep apnea and disrupt normal sleep architecture. - Sleep hygiene should be reviewed to assess factors that may improve sleep quality. - Weight management and regular exercise should be initiated or continued if appropriate. - Recommend overnight pulse ox on O2 at 1L  [Electronically signed] 03/14/2018 05:39 PM  Fransico Him MD, ABSM Diplomate, American Board of Sleep Medicine

## 2018-03-18 ENCOUNTER — Telehealth: Payer: Self-pay | Admitting: Pulmonary Disease

## 2018-03-18 ENCOUNTER — Telehealth: Payer: Self-pay | Admitting: *Deleted

## 2018-03-18 NOTE — Telephone Encounter (Signed)
Spoke with [name] at [facility]. Based on the pt's sleep study, Dr. Radford Pax wants to order oxygen for the pt since his oxygen level decreased to 79% on room air. Per the pt, he told [name] that [name] told him that he didn't need to wear oxygen as long as his oxygen level stayed above 85% on room air. Dr. Radford Pax wants to order 1L [place] and perform an ONO to see if this is adequate enough. The pt was very adamant that he didn't want to do this. [name] states that she is going to talk to the pt about this again and see if he wants them to manage this or have our office do it. She will contact us back if the pt wants [place] to do it.

## 2018-03-18 NOTE — Telephone Encounter (Signed)
-----  Message from Sueanne Margarita, MD sent at 03/14/2018  5:43 PM EDT ----- Please let patient know that he does not have OSA but dose have nocturnal hypoxemia which is likely causing his pulmonary HTN - I have ordered O2 at 1L [place] nightly.  Please get overnight pulse ox on O2 and followup with me in 4-6 weeks

## 2018-03-18 NOTE — Telephone Encounter (Signed)
Informed patient of sleep study results and patient understanding was verbalized. Patient understands his sleep study showed he does not have OSA but dose have nocturnal hypoxemia which is likely causing his pulmonary HTN. Patient understands Dr Radford Pax has ordered O2 at 1L  nightly. Patient understands she wants an overnight pulse ox on O2 and followup with her in 4-6 weeks.  Patient states Dr Elsworth Soho and Rexene Edison manages  his [phone] and he would like to continue letting them manage it.

## 2018-03-18 NOTE — Telephone Encounter (Signed)
Pl get OV with me or TP to assess & review sleep study & then we can order after face to face visit

## 2018-03-18 NOTE — Telephone Encounter (Signed)
Please talk to [name]

## 2018-03-18 NOTE — Telephone Encounter (Signed)
Attempted to call [name] with [facility] at phone [phone] I did not receive an answer at time of call. I have left a voicemail message for pt to return call. X1

## 2018-03-18 NOTE — Telephone Encounter (Signed)
Spoke with [name] again with [facility]. (Please see telephone encounter from earlier today.) She spoke with the pt and would like for [place] to manage ordering oxygen if [name] sees fit.  RA - please advise. Thanks.

## 2018-03-19 NOTE — Telephone Encounter (Signed)
Dr Bari Mantis office [name]) called to say at the patient's request they will make an appointment with the patient to come in and discuss managing his oxygen and sleep needs.

## 2018-03-19 NOTE — Telephone Encounter (Signed)
Called and spoke with pt regarding f/u appt with RA Pt is wanting to stay with RA, have RA to f/u with pt's sleep and pulm concerns  Called and spoke with [name] with [facility] at phone [phone] Advised her that pt will stay with RA, he is f/u with pt's sleep and pulm Nothing further needed at this time.

## 2018-03-27 ENCOUNTER — Encounter (HOSPITAL_COMMUNITY): Payer: Self-pay | Admitting: *Deleted

## 2018-03-27 DIAGNOSIS — J432 Centrilobular emphysema: Secondary | ICD-10-CM

## 2018-03-27 NOTE — Progress Notes (Signed)
Discharge Progress Report  Patient Details  Name: Joshua [name] MRN: [id number] Date of Birth: 1935/12/23 Referring Provider:     Pulmonary Rehab Walk Test from 11/04/2017 in [place]  Referring Provider  [name]        Number of Visits: 25 pulmonary rehab sessions in 14 week period  Reason for Discharge:  Patient reached a stable level of exercise. Patient independent in their exercise. Patient has met program and personal goals.  Smoking History:  Social History   Tobacco Use  Smoking Status Former Smoker  . Packs/day: 1.00  . Years: 50.00  . Pack years: 50.00  . Types: Cigarettes  . Last attempt to quit: 08/19/2006  . Years since quitting: 11.6  Smokeless Tobacco Never Used    Diagnosis:  Centrilobular emphysema (HCC)  ADL UCSD: Pulmonary Assessment Scores    Row Name 02/12/18 1609 03/03/18 1301       ADL UCSD   ADL Phase  Exit  Exit    SOB Score total  19  -      CAT Score   CAT Score  pre 11, post 9  -      mMRC Score   mMRC Score  -  0       Initial Exercise Prescription:   Discharge Exercise Prescription (Final Exercise Prescription Changes): Exercise Prescription Changes - 02/24/18 1300      Response to Exercise   Blood Pressure (Admit)  100/64    Blood Pressure (Exercise)  110/60    Blood Pressure (Exit)  90/60    Heart Rate (Admit)  78 bpm    Heart Rate (Exercise)  97 bpm    Heart Rate (Exit)  77 bpm    Oxygen Saturation (Admit)  98 %    Oxygen Saturation (Exercise)  91 %    Oxygen Saturation (Exit)  95 %    Rating of Perceived Exertion (Exercise)  12    Perceived Dyspnea (Exercise)  1    Duration  Progress to 45 minutes of aerobic exercise without signs/symptoms of physical distress    Intensity  THRR unchanged      Progression   Progression  Continue to progress workloads to maintain intensity without signs/symptoms of physical distress.      Resistance Training   Training Prescription  Yes    Weight  blue bands    Reps  10-15    Time  10 Minutes      Interval Training   Interval Training  No      Bike   Level  1.4    Minutes  17      NuStep   Level  6    SPM  80    Minutes  17    METs  2.3      Track   Laps  17    Minutes  17       Functional Capacity: 6 Minute Walk    Row Name 03/03/18 1258         6 Minute Walk   Phase  Discharge     Distance  1400 feet     Distance Feet Change  300 ft     Walk Time  6 minutes     # of Rest Breaks  0     MPH  2.65     METS  2.99     RPE  11     Perceived Dyspnea   1  Symptoms  No     Resting HR  82 bpm     Resting BP  102/60     Resting Oxygen Saturation   92 %     Exercise Oxygen Saturation  during 6 min walk  87 %     Max Ex. HR  120 bpm     Max Ex. BP  114/70       Interval HR   1 Minute HR  95     2 Minute HR  111     3 Minute HR  119     4 Minute HR  111     5 Minute HR  119     6 Minute HR  120     2 Minute Post HR  60     Interval Heart Rate?  Yes       Interval Oxygen   Interval Oxygen?  Yes     Baseline Oxygen Saturation %  92 %     1 Minute Oxygen Saturation %  94 %     1 Minute Liters of Oxygen  0 L     2 Minute Oxygen Saturation %  93 %     2 Minute Liters of Oxygen  0 L     3 Minute Oxygen Saturation %  89 %     3 Minute Liters of Oxygen  0 L     4 Minute Oxygen Saturation %  87 %     4 Minute Liters of Oxygen  0 L     5 Minute Oxygen Saturation %  87 %     5 Minute Liters of Oxygen  0 L     6 Minute Oxygen Saturation %  90 %     6 Minute Liters of Oxygen  0 L     2 Minute Post Oxygen Saturation %  96 %     2 Minute Post Liters of Oxygen  0 L        Psychological, QOL, Others - Outcomes: PHQ 2/9: Depression screen [facility] 2/9 03/03/2018 11/03/2017  Decreased Interest 0 0  Down, Depressed, Hopeless 0 0  PHQ - 2 Score 0 0  Altered sleeping 0 -  Tired, decreased energy 1 -  Change in appetite 0 -  Trouble concentrating 0 -  Moving slowly or fidgety/restless 0 -  Suicidal  thoughts 0 -  PHQ-9 Score 1 -  Difficult doing work/chores Not difficult at all -    Quality of Life:   Personal Goals: Goals established at orientation with interventions provided to work toward goal.    Personal Goals Discharge: Goals and Risk Factor Review    Row Name 02/16/18 1354 03/27/18 1508           Core Components/Risk Factors/Patient Goals Review   Personal Goals Review  Heart Failure;Improve shortness of breath with ADL's  Heart Failure;Improve shortness of breath with ADL's      Review  Close to graduating,walking up to 18 laps in 17 minutes, level 6 on nustep, and level 1.4 on bicycle  Pt graduates with the completion of 25 pulmonary rehab sessions      Expected Outcomes  see "admission expected outcomes"  Met admission outcome goals         Exercise Goals and Review:   Nutrition & Weight - Outcomes:    Nutrition:   Nutrition Discharge: Nutrition Assessments - 02/13/18 0827      Rate Your Plate Scores  Pre Score  43    Post Score  41       Education Questionnaire Score: Knowledge Questionnaire Score - 02/12/18 1608      Knowledge Questionnaire Score   Post Score  17/18       Goals reviewed with patient. Cherre Huger, BSN Cardiac and Training and development officer

## 2018-04-07 ENCOUNTER — Inpatient Hospital Stay: Admission: RE | Admit: 2018-04-07 | Payer: Medicare Other | Source: Ambulatory Visit

## 2018-04-15 ENCOUNTER — Ambulatory Visit (INDEPENDENT_AMBULATORY_CARE_PROVIDER_SITE_OTHER)
Admission: RE | Admit: 2018-04-15 | Discharge: 2018-04-15 | Disposition: A | Discharge status: Home / Self Care | Payer: Medicare Other | Source: Ambulatory Visit | Attending: Pulmonary Disease | Admitting: Pulmonary Disease

## 2018-04-15 DIAGNOSIS — J441 Chronic obstructive pulmonary disease with (acute) exacerbation: Secondary | ICD-10-CM | POA: Diagnosis not present

## 2018-04-15 DIAGNOSIS — R918 Other nonspecific abnormal finding of lung field: Secondary | ICD-10-CM | POA: Diagnosis not present

## 2018-04-28 DIAGNOSIS — Z961 Presence of intraocular lens: Secondary | ICD-10-CM | POA: Diagnosis not present

## 2018-05-07 ENCOUNTER — Encounter: Payer: Self-pay | Admitting: Pulmonary Disease

## 2018-05-07 ENCOUNTER — Ambulatory Visit (INDEPENDENT_AMBULATORY_CARE_PROVIDER_SITE_OTHER): Payer: Medicare Other | Admitting: Pulmonary Disease

## 2018-05-07 DIAGNOSIS — Z23 Encounter for immunization: Secondary | ICD-10-CM

## 2018-05-07 DIAGNOSIS — I2729 Other secondary pulmonary hypertension: Secondary | ICD-10-CM | POA: Diagnosis not present

## 2018-05-07 DIAGNOSIS — J449 Chronic obstructive pulmonary disease, unspecified: Secondary | ICD-10-CM | POA: Diagnosis not present

## 2018-05-07 NOTE — Assessment & Plan Note (Signed)
PA pressures have decreased Appears secondary, continue Lasix

## 2018-05-07 NOTE — Assessment & Plan Note (Addendum)
flu shot today. Continue on Spiriva.  Reviewed CT-pulmonary nodules have resolved does not need further imaging follow-up.  Reviewed sleep study raw data-desaturation was only for 45 minutes of total sleep time, does not need nocturnal oxygen, he was happy to hear that

## 2018-05-07 NOTE — Patient Instructions (Signed)
flu shot today. Continue on Spiriva. Okay to hold off on oxygen

## 2018-05-07 NOTE — Progress Notes (Signed)
   Subjective:    Patient ID: Joshua [name], male    DOB: [birth year], 82 y.o.   MRN: [id number]  HPI  82 yo former smoker for FU of COPD & pulmonary hypertension He has h/o PE postop after a knee replacement in 2016. He was hospitalized 08/2017 for 3 days for acute hypoxic respiratory failure, attributed to diastolic heart failure.  He was diuresed 2.5 L, had elevated troponin, follow-up stress Myoview was low risk for ischemia. CT angiogram 08/2017 was reviewed which showed left upper lobe nodule 9x6 mm. Echo showed normal LV function with RVSP 73 mm  Today, we discussed follow-up CT 03/2018 which showed resolution of nodules follow-up echo 12/2017 which showed that PA pressures have decreased.  In the past, he has been tried on Brio, anoro & trelegy  none of these seem to provide him any symptomatic relief -he has settled on Spiriva and seems to prefer this. 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  He underwent sleep study by cardiology and was advised oxygen I reviewed the raw data  Significant tests/ events reviewed Echo 12/2017 RVSP 39  NPSG 02/2018 >> no OSA, 45 mins desatn, TST 5.5 h  09/15/14 CT chest showed a large bilateral pulmonary embolism with RV dilatation. 08/2014 Venous Doppler showed no DVT   PFTs 11/2014 - FEV1 59%, no BD response, ratio 70, TLC 68%, DLCO 35%   Past Medical History:  Diagnosis Date  . Arthritis   . BPH (benign prostatic hyperplasia)   . COPD (chronic obstructive pulmonary disease) ([place])   . Folliculitis    posterior scalp per office visit note of Dr Maudie Mercury 07/20/2014    . GERD (gastroesophageal reflux disease)   . Hypertension   . PE (pulmonary thromboembolism) ([place])   . Phlebitis    right arm  at least 20 years ago   . Pneumonia    hx of pneumonia as a child      Review of Systems neg for any significant sore throat, dysphagia, itching, sneezing, nasal congestion or excess/ purulent secretions,  fever, chills, sweats, unintended wt loss, pleuritic or exertional cp, hempoptysis, orthopnea pnd or change in chronic leg swelling. Also denies presyncope, palpitations, heartburn, abdominal pain, nausea, vomiting, diarrhea or change in bowel or urinary habits, dysuria,hematuria, rash, arthralgias, visual complaints, headache, numbness weakness or ataxia.     Objective:   Physical Exam   Gen. Pleasant, well-nourished, in no distress ENT - no thrush, no post nasal drip Neck: No JVD, no thyromegaly, no carotid bruits Lungs: no use of accessory muscles, no dullness to percussion, clear without rales or rhonchi  Cardiovascular: Rhythm regular, heart sounds  normal, no murmurs or gallops, no peripheral edema Musculoskeletal: No deformities, no cyanosis or clubbing          Assessment & Plan:

## 2018-05-14 ENCOUNTER — Ambulatory Visit: Payer: Medicare Other | Admitting: Pulmonary Disease

## 2018-05-22 DIAGNOSIS — R972 Elevated prostate specific antigen [PSA]: Secondary | ICD-10-CM | POA: Diagnosis not present

## 2018-06-01 DIAGNOSIS — N400 Enlarged prostate without lower urinary tract symptoms: Secondary | ICD-10-CM | POA: Diagnosis not present

## 2018-06-01 DIAGNOSIS — R972 Elevated prostate specific antigen [PSA]: Secondary | ICD-10-CM | POA: Diagnosis not present

## 2018-06-04 DIAGNOSIS — E7800 Pure hypercholesterolemia, unspecified: Secondary | ICD-10-CM | POA: Diagnosis not present

## 2018-06-04 DIAGNOSIS — J449 Chronic obstructive pulmonary disease, unspecified: Secondary | ICD-10-CM | POA: Diagnosis not present

## 2018-06-04 DIAGNOSIS — E119 Type 2 diabetes mellitus without complications: Secondary | ICD-10-CM | POA: Diagnosis not present

## 2018-06-04 DIAGNOSIS — I10 Essential (primary) hypertension: Secondary | ICD-10-CM | POA: Diagnosis not present

## 2018-06-24 ENCOUNTER — Other Ambulatory Visit: Payer: Self-pay | Admitting: Pulmonary Disease

## 2018-07-13 NOTE — Progress Notes (Signed)
Cardiology Office Note:    Date:  07/14/2018   ID:  Joshua [name], DOB 1936/04/03, MRN [id number]  PCP:  Joshua Gravel, MD  Cardiologist:  Joshua Mocha, MD  Electrophysiologist:  None  Pulmonologist: Joshua [name]  Referring MD: Joshua Gravel, MD   Chief Complaint  Patient presents with  . Follow-up    On CHF    History of Present Illness:    Joshua [name] is a 82 y.o. male with COPD, diastolic heart failure, pulmonary hypertension, pulmonary embolism, tension.  He was hospitalized in January 2019 with acute respiratory failure in the setting of severe COPD.  He was also felt to have a component of diastolic heart failure.  There was mild troponin elevation.  Echocardiogram demonstrated normal LV function with severe pulmonary hypertension.  Follow-up echocardiogram demonstrated improved pulmonary hypertension with estimated PASP of 39 mmHg.  The study did demonstrate systolic and diastolic flattening of the septum and severe RV dysfunction.  He was last seen by Dr. Burt [name] in May 2019.  His Lasix was increased to 40 mg daily and a sleep study was obtained.  This demonstrated no significant obstructive sleep apnea.  However, the patient did have nocturnal hypoxemia.  He was seen in follow-up by pulmonology (Joshua [name]).  He was not felt to need nocturnal oxygen due to the brief nature of his desaturation.    Joshua [name] returns for follow-up.  He is here alone.  Since last seen, he notes his lower extremity swelling is fairly stable.  His swelling is usually decreased in the mornings when he awakens and increases throughout the day.  He has not had any worsening shortness of breath.  He continues to exercise on his own.  He denies chest pain, paroxysmal nocturnal dyspnea or syncope.  Prior CV studies:   The following studies were reviewed today:  Echocardiogram 01/05/2018 EF 50-55, inferolateral HK, grade 1 diastolic dysfunction, ventricular septum with diastolic flattening and systolic  flattening, trivial AI, mild RV dilation, severely reduced RVSF, PASP 39  Nuclear stress test 09/24/2017 EF 54, no ischemia; Low Risk  Echocardiogram 08/25/2017 Severe focal basal septal hypertrophy, EF 55-60, normal wall motion, grade 1 diastolic dysfunction, mild to moderate AI, mildly dilated aortic root, severe RV dilation, severely reduced RVSF, moderate RAE, moderate PI, PASP 73    Past Medical History:  Diagnosis Date  . Arthritis   . BPH (benign prostatic hyperplasia)   . COPD (chronic obstructive pulmonary disease) ([place])   . Folliculitis    posterior scalp per office visit note of Dr Maudie Mercury 07/20/2014    . GERD (gastroesophageal reflux disease)   . Hypertension   . PE (pulmonary thromboembolism) ([place])   . Phlebitis    right arm  at least 20 years ago   . Pneumonia    hx of pneumonia as a child    Surgical Hx: The patient  has a past surgical history that includes pilonidal cyst removal ; Cholecystectomy; bone removed from little toe right foot ; and Total knee arthroplasty (Left, 09/13/2014).   Current Medications: Current Meds  Medication Sig  . albuterol (PROVENTIL HFA;VENTOLIN HFA) 108 (90 Base) MCG/ACT inhaler Inhale 1-2 puffs into the lungs every 6 (six) hours as needed for wheezing or shortness of breath.  . allopurinol (ZYLOPRIM) 100 MG tablet Take 100 mg by mouth daily.  Marland Kitchen aspirin 81 MG tablet Take 81 mg by mouth daily.  . Cinnamon 500 MG capsule Take 500 mg by mouth daily.  Marland Kitchen doxazosin (CARDURA)  4 MG tablet Take 4 mg by mouth at bedtime. Patient takes in the pm  . furosemide (LASIX) 40 MG tablet Take 1 tablet (40 mg total) by mouth daily.  . pantoprazole (PROTONIX) 40 MG tablet Take 40 mg by mouth daily.  . ramipril (ALTACE) 2.5 MG capsule Take 2.5 mg by mouth daily.  Marland Kitchen SPIRIVA RESPIMAT 1.25 MCG/ACT AERS INHALE 2 PUFFS INTO THE LUNGS DAILY.     Allergies:   Other; Sunflower oil; and Sulfa antibiotics   Social History   Tobacco Use  . Smoking status: Former  Smoker    Packs/day: 1.00    Years: 50.00    Pack years: 50.00    Types: Cigarettes    Last attempt to quit: 08/19/2006    Years since quitting: 11.9  . Smokeless tobacco: Never Used  Substance Use Topics  . Alcohol use: Yes    Alcohol/week: 6.0 standard drinks    Types: 6 Shots of liquor per week  . Drug use: No     Family Hx: The patient's family history includes Heart attack (age of onset: 54) in his brother.  ROS:   Please see the history of present illness.    Review of Systems  Cardiovascular: Positive for leg swelling.  Respiratory: Positive for cough and snoring.    All other systems reviewed and are negative.   EKGs/Labs/Other Test Reviewed:    EKG:  EKG is  ordered today.  The ekg ordered today demonstrates normal sinus rhythm, heart rate 76, right bundle branch block, PVC, QTC 501, similar to prior tracing  Recent Labs: 08/24/2017: ALT 27; B Natriuretic Peptide 1,253.8 08/27/2017: Hemoglobin 14.3; Platelets 174 09/18/2017: BUN 17; Creatinine, Ser 1.07; Potassium 4.5; Sodium 139   Recent Lipid Panel No results found for: CHOL, TRIG, HDL, CHOLHDL, LDLCALC, LDLDIRECT  Physical Exam:    VS:  BP 118/70   Pulse 80   Ht _0  (1.88 m)   Wt 259 lb 12.8 oz (117.8 kg)   SpO2 94%   BMI 33.36 kg/m     Wt Readings from Last 3 Encounters:  07/14/18 259 lb 12.8 oz (117.8 kg)  05/07/18 253 lb (114.8 kg)  03/13/18 250 lb (113.4 kg)     Physical Exam  Constitutional: He is oriented to person, place, and time. He appears well-developed and well-nourished. No distress.  HENT:  Head: Normocephalic and atraumatic.  Eyes: No scleral icterus.  Neck: No JVD present. No thyromegaly present.  Cardiovascular: Normal rate and regular rhythm.  No murmur heard. Pulmonary/Chest: He has no rales.  Abdominal: Soft.  Musculoskeletal: He exhibits edema (1+ bilat ankle edema).  Lymphadenopathy:    He has no cervical adenopathy.  Neurological: He is alert and oriented to person,  place, and time.  Skin: Skin is warm and dry.  Psychiatric: He has a normal mood and affect.    ASSESSMENT & PLAN:    Chronic diastolic congestive heart failure ([place]) He really has more right-sided heart failure than left.  We discussed the etiology for his lower extremity swelling and the physiology of right-sided heart failure.  We discussed the possibility of switching furosemide to torsemide versus adding compression stockings.  I prefer that he start using compression stockings and keeping his legs elevated before we switch to torsemide.  He already has compression stockings at home.  He will try to start using these.  Continue current dose of furosemide.  Obtain follow-up BMET today.  Chronic obstructive pulmonary disease, unspecified COPD type ([place]) Continue  follow-up with pulmonology as planned.  Other secondary pulmonary hypertension ([place])  As noted recent echocardiogram demonstrated improved PASP.  Essential hypertension  The patient's blood pressure is controlled on his current regimen.  Continue current therapy.    Dispo:  Return in about 6 months (around 01/12/2019) for Routine Follow Up, w/ Dr. Burt [name].   Medication Adjustments/Labs and Tests Ordered: Current medicines are reviewed at length with the patient today.  Concerns regarding medicines are outlined above.  Tests Ordered: Orders Placed This Encounter  Procedures  . Basic metabolic panel  . EKG 12-Lead   Medication Changes: No orders of the defined types were placed in this encounter.   Signed, Richardson Dopp, PA-C  07/14/2018 2:09 PM    [facility] [address], [place], [place]  [address] Phone: [phone]; Fax: [phone]

## 2018-07-14 ENCOUNTER — Encounter: Payer: Self-pay | Admitting: Physician Assistant

## 2018-07-14 ENCOUNTER — Ambulatory Visit (INDEPENDENT_AMBULATORY_CARE_PROVIDER_SITE_OTHER): Payer: Medicare Other | Admitting: Physician Assistant

## 2018-07-14 VITALS — BP 118/70 | HR 80 | Ht 74.0 in | Wt 259.8 lb

## 2018-07-14 DIAGNOSIS — I5032 Chronic diastolic (congestive) heart failure: Secondary | ICD-10-CM | POA: Diagnosis not present

## 2018-07-14 DIAGNOSIS — I2729 Other secondary pulmonary hypertension: Secondary | ICD-10-CM

## 2018-07-14 DIAGNOSIS — I10 Essential (primary) hypertension: Secondary | ICD-10-CM

## 2018-07-14 DIAGNOSIS — J449 Chronic obstructive pulmonary disease, unspecified: Secondary | ICD-10-CM

## 2018-07-14 LAB — BASIC METABOLIC PANEL
BUN/Creatinine Ratio: 14 (ref 10–24)
BUN: 17 mg/dL (ref 8–27)
CO2: 27 mmol/L (ref 20–29)
Calcium: 9.5 mg/dL (ref 8.6–10.2)
Chloride: 100 mmol/L (ref 96–106)
Creatinine, Ser: 1.21 mg/dL (ref 0.76–1.27)
GFR calc Af Amer: 65 mL/min/{1.73_m2} (ref >59)
GFR calc non Af Amer: 56 mL/min/{1.73_m2} — ABNORMAL LOW (ref >59)
Glucose: 93 mg/dL (ref 65–99)
Potassium: 3.9 mmol/L (ref 3.5–5.2)
Sodium: 141 mmol/L (ref 134–144)

## 2018-07-14 NOTE — Patient Instructions (Signed)
Medication Instructions:  Your physician recommends that you continue on your current medications as directed. Please refer to the Current Medication list given to you today.  If you need a refill on your cardiac medications before your next appointment, please call your pharmacy.   Lab work: TODAY: BMET If you have labs (blood work) drawn today and your tests are completely normal, you will receive your results only by: Marland Kitchen MyChart Message (if you have MyChart) OR . A paper copy in the mail If you have any lab test that is abnormal or we need to change your treatment, we will call you to review the results.  Testing/Procedures: NONE  Follow-Up: At [facility], you and your health needs are our priority.  As part of our continuing mission to provide you with exceptional heart care, we have created designated Provider Care Teams.  These Care Teams include your primary Cardiologist (physician) and Advanced Practice Providers (APPs -  Physician Assistants and Nurse Practitioners) who all work together to provide you with the care you need, when you need it. You will need a follow up appointment in:  6 months.  Please call our office 2 months in advance to schedule this appointment.  You may see Sherren Mocha, MD or one of the following Advanced Practice Providers on your designated Care Team: Richardson Dopp, PA-C [place], [place] . Daune Perch, NP  Any Other Special Instructions Will Be Listed Below (If Applicable).

## 2018-08-01 ENCOUNTER — Emergency Department (HOSPITAL_COMMUNITY): Payer: Medicare Other

## 2018-08-01 ENCOUNTER — Emergency Department (HOSPITAL_COMMUNITY)
Admission: EM | Admit: 2018-08-01 | Discharge: 2018-08-01 | Disposition: A | Discharge status: Home / Self Care | Payer: Medicare Other | Attending: Emergency Medicine | Admitting: Emergency Medicine

## 2018-08-01 ENCOUNTER — Other Ambulatory Visit: Payer: Self-pay

## 2018-08-01 DIAGNOSIS — S199XXA Unspecified injury of neck, initial encounter: Secondary | ICD-10-CM | POA: Diagnosis not present

## 2018-08-01 DIAGNOSIS — R58 Hemorrhage, not elsewhere classified: Secondary | ICD-10-CM | POA: Diagnosis not present

## 2018-08-01 DIAGNOSIS — M25462 Effusion, left knee: Secondary | ICD-10-CM | POA: Diagnosis not present

## 2018-08-01 DIAGNOSIS — W010XXA Fall on same level from slipping, tripping and stumbling without subsequent striking against object, initial encounter: Secondary | ICD-10-CM | POA: Diagnosis not present

## 2018-08-01 DIAGNOSIS — Y9301 Activity, walking, marching and hiking: Secondary | ICD-10-CM | POA: Diagnosis not present

## 2018-08-01 DIAGNOSIS — Z79899 Other long term (current) drug therapy: Secondary | ICD-10-CM | POA: Diagnosis not present

## 2018-08-01 DIAGNOSIS — S8992XA Unspecified injury of left lower leg, initial encounter: Secondary | ICD-10-CM | POA: Diagnosis not present

## 2018-08-01 DIAGNOSIS — W19XXXA Unspecified fall, initial encounter: Secondary | ICD-10-CM | POA: Diagnosis not present

## 2018-08-01 DIAGNOSIS — Z87891 Personal history of nicotine dependence: Secondary | ICD-10-CM | POA: Diagnosis not present

## 2018-08-01 DIAGNOSIS — S6991XA Unspecified injury of right wrist, hand and finger(s), initial encounter: Secondary | ICD-10-CM | POA: Diagnosis not present

## 2018-08-01 DIAGNOSIS — S0181XA Laceration without foreign body of other part of head, initial encounter: Secondary | ICD-10-CM | POA: Insufficient documentation

## 2018-08-01 DIAGNOSIS — S0081XA Abrasion of other part of head, initial encounter: Secondary | ICD-10-CM | POA: Diagnosis not present

## 2018-08-01 DIAGNOSIS — I10 Essential (primary) hypertension: Secondary | ICD-10-CM | POA: Diagnosis not present

## 2018-08-01 DIAGNOSIS — S299XXA Unspecified injury of thorax, initial encounter: Secondary | ICD-10-CM | POA: Insufficient documentation

## 2018-08-01 DIAGNOSIS — Y998 Other external cause status: Secondary | ICD-10-CM | POA: Insufficient documentation

## 2018-08-01 DIAGNOSIS — R51 Headache: Secondary | ICD-10-CM | POA: Diagnosis not present

## 2018-08-01 DIAGNOSIS — R7989 Other specified abnormal findings of blood chemistry: Secondary | ICD-10-CM | POA: Diagnosis not present

## 2018-08-01 DIAGNOSIS — Z23 Encounter for immunization: Secondary | ICD-10-CM | POA: Diagnosis not present

## 2018-08-01 DIAGNOSIS — R0902 Hypoxemia: Secondary | ICD-10-CM | POA: Diagnosis not present

## 2018-08-01 DIAGNOSIS — S80212A Abrasion, left knee, initial encounter: Secondary | ICD-10-CM | POA: Diagnosis not present

## 2018-08-01 DIAGNOSIS — R0781 Pleurodynia: Secondary | ICD-10-CM | POA: Diagnosis not present

## 2018-08-01 DIAGNOSIS — Y92009 Unspecified place in unspecified non-institutional (private) residence as the place of occurrence of the external cause: Secondary | ICD-10-CM | POA: Diagnosis not present

## 2018-08-01 DIAGNOSIS — S8991XA Unspecified injury of right lower leg, initial encounter: Secondary | ICD-10-CM | POA: Diagnosis not present

## 2018-08-01 DIAGNOSIS — S80211A Abrasion, right knee, initial encounter: Secondary | ICD-10-CM | POA: Insufficient documentation

## 2018-08-01 DIAGNOSIS — M546 Pain in thoracic spine: Secondary | ICD-10-CM | POA: Diagnosis not present

## 2018-08-01 DIAGNOSIS — S0990XA Unspecified injury of head, initial encounter: Secondary | ICD-10-CM | POA: Diagnosis not present

## 2018-08-01 DIAGNOSIS — M25561 Pain in right knee: Secondary | ICD-10-CM | POA: Diagnosis not present

## 2018-08-01 DIAGNOSIS — M79641 Pain in right hand: Secondary | ICD-10-CM | POA: Diagnosis not present

## 2018-08-01 DIAGNOSIS — M542 Cervicalgia: Secondary | ICD-10-CM | POA: Diagnosis not present

## 2018-08-01 LAB — BASIC METABOLIC PANEL
Anion gap: 14 (ref 5–15)
BUN: 17 mg/dL (ref 8–23)
CO2: 28 mmol/L (ref 22–32)
Calcium: 9.1 mg/dL (ref 8.9–10.3)
Chloride: 101 mmol/L (ref 98–111)
Creatinine, Ser: 1.16 mg/dL (ref 0.61–1.24)
GFR calc Af Amer: >60 mL/min (ref >60)
GFR calc non Af Amer: 59 mL/min — ABNORMAL LOW (ref >60)
Glucose, Bld: 106 mg/dL — ABNORMAL HIGH (ref 70–99)
Potassium: 3.7 mmol/L (ref 3.5–5.1)
Sodium: 143 mmol/L (ref 135–145)

## 2018-08-01 LAB — CBC
HCT: 52.5 % — ABNORMAL HIGH (ref 39.0–52.0)
Hemoglobin: 16.1 g/dL (ref 13.0–17.0)
MCH: 27.6 pg (ref 26.0–34.0)
MCHC: 30.7 g/dL (ref 30.0–36.0)
MCV: 89.9 fL (ref 80.0–100.0)
Platelets: 212 10*3/uL (ref 150–400)
RBC: 5.84 MIL/uL — ABNORMAL HIGH (ref 4.22–5.81)
RDW: 17.1 % — ABNORMAL HIGH (ref 11.5–15.5)
WBC: 6.1 10*3/uL (ref 4.0–10.5)
nRBC: 0 % (ref 0.0–0.2)

## 2018-08-01 LAB — D-DIMER, QUANTITATIVE: D-Dimer, Quant: 2.41 ug/mL-FEU — ABNORMAL HIGH (ref 0.00–0.50)

## 2018-08-01 MED ORDER — SODIUM CHLORIDE (PF) 0.9 % IJ SOLN
INTRAMUSCULAR | Status: AC
  Filled 2018-08-01: qty 50

## 2018-08-01 MED ORDER — TETANUS-DIPHTH-ACELL PERTUSSIS 5-2.5-18.5 LF-MCG/0.5 IM SUSP
0.5000 mL | Freq: Once | INTRAMUSCULAR | Status: AC
  Administered 2018-08-01: 0.5 mL via INTRAMUSCULAR
  Filled 2018-08-01: qty 0.5

## 2018-08-01 MED ORDER — OXYCODONE-ACETAMINOPHEN 5-325 MG PO TABS
1.0000 | ORAL_TABLET | Freq: Once | ORAL | Status: AC
  Administered 2018-08-01: 1 via ORAL
  Filled 2018-08-01: qty 1

## 2018-08-01 MED ORDER — LIDOCAINE-EPINEPHRINE (PF) 2 %-1:200000 IJ SOLN
10.0000 mL | Freq: Once | INTRAMUSCULAR | Status: AC
  Administered 2018-08-01: 10 mL
  Filled 2018-08-01: qty 10

## 2018-08-01 MED ORDER — IOPAMIDOL (ISOVUE-370) INJECTION 76%
INTRAVENOUS | Status: AC
  Administered 2018-08-01: 100 mL
  Filled 2018-08-01: qty 100

## 2018-08-01 NOTE — ED Notes (Signed)
Bed: RU43 Expected date:  Expected time:  Means of arrival:  Comments: FALL 82yo

## 2018-08-01 NOTE — ED Triage Notes (Signed)
Pt BIB EMS from home.  Pt reports slipping in the driveway. Hit head on driveway.  Laceration on left forehead. Does not take blood thinners, no LOC, no neck pain.  Complains of headache. Hx of COPD.

## 2018-08-01 NOTE — ED Provider Notes (Signed)
[facility] DEPT Provider Note   CSN: [id number] Arrival date & time: 08/01/18  0907     History   Chief Complaint Chief Complaint  Patient presents with  . Fall    HPI Joshua [name] is a 82 y.o. male.  HPI  82 yo male who fell outside today getting newspaper.  Able to call wife and was assisted to chair.  Laceration with bleeding to face. Complains of some right sided upper mid back when ambulance hit bumps.  Pain and abrasions to right hand, bilateral knees.   Past Medical History:  Diagnosis Date  . Arthritis   . BPH (benign prostatic hyperplasia)   . COPD (chronic obstructive pulmonary disease) ([place])   . Folliculitis    posterior scalp per office visit note of Dr Maudie Mercury 07/20/2014    . GERD (gastroesophageal reflux disease)   . Hypertension   . PE (pulmonary thromboembolism) ([place])   . Phlebitis    right arm  at least 20 years ago   . Pneumonia    hx of pneumonia as a child     Patient Active Problem List   Diagnosis Date Noted  . Other secondary pulmonary hypertension ([place]) 09/29/2017  . BPH with urinary obstruction 08/25/2017  . Syncope, vasovagal 08/25/2017  . Demand ischemia of myocardium ([place]) 08/25/2017  . Diastolic CHF ([place]) 65/78/4696  . Essential hypertension 02/16/2015  . COPD (chronic obstructive pulmonary disease) ([place]) 12/12/2014  . History of pulmonary embolism 10/10/2014  . S/P knee replacement 09/13/2014    Past Surgical History:  Procedure Laterality Date  . bone removed from little toe right foot     . CHOLECYSTECTOMY    . pilonidal cyst removal     . TOTAL KNEE ARTHROPLASTY Left 09/13/2014   Procedure: LEFT TOTAL KNEE ARTHROPLASTY;  Surgeon: Mauri Pole, MD;  Location: [facility] ORS;  Service: Orthopedics;  Laterality: Left;        Home Medications    Prior to Admission medications   Medication Sig Start Date End Date Taking? Authorizing Provider  albuterol (PROVENTIL HFA;VENTOLIN HFA) 108 (90 Base)  MCG/ACT inhaler Inhale 1-2 puffs into the lungs every 6 (six) hours as needed for wheezing or shortness of breath. 12/01/17   Magdalen Spatz, NP  allopurinol (ZYLOPRIM) 100 MG tablet Take 100 mg by mouth daily.    [provider]  aspirin 81 MG tablet Take 81 mg by mouth daily.    [provider]  Cinnamon 500 MG capsule Take 500 mg by mouth daily.    [provider]  doxazosin (CARDURA) 4 MG tablet Take 4 mg by mouth at bedtime. Patient takes in the pm    [provider]  furosemide (LASIX) 40 MG tablet Take 1 tablet (40 mg total) by mouth daily. 01/15/18 01/10/19  Sherren Mocha, MD  pantoprazole (PROTONIX) 40 MG tablet Take 40 mg by mouth daily.    [provider]  ramipril (ALTACE) 2.5 MG capsule Take 2.5 mg by mouth daily.    [provider]  SPIRIVA RESPIMAT 1.25 MCG/ACT AERS INHALE 2 PUFFS INTO THE LUNGS DAILY. 06/25/18   [name], MD    Family History Family History  Problem Relation Age of Onset  . Heart attack Brother 59    Social History Social History   Tobacco Use  . Smoking status: Former Smoker    Packs/day: 1.00    Years: 50.00    Pack years: 50.00    Types: Cigarettes  Last attempt to quit: 08/19/2006    Years since quitting: 11.9  . Smokeless tobacco: Never Used  Substance Use Topics  . Alcohol use: Yes    Alcohol/week: 6.0 standard drinks    Types: 6 Shots of liquor per week  . Drug use: No     Allergies   Other; Sunflower oil; and Sulfa antibiotics   Review of Systems Review of Systems  All other systems reviewed and are negative.    Physical Exam Updated Vital Signs BP (!) 144/84   Pulse 86   Temp 98 F (36.7 C) (Oral)   Resp 13   Ht 1.892 m (6' 2.5")   Wt 113.4 kg   SpO2 91%   BMI 31.67 kg/m   Physical Exam Vitals signs and nursing note reviewed.  Constitutional:      Appearance: Normal appearance. He is obese.  HENT:     Head: Normocephalic.     Comments: Laceration right  forehead Abrasion right cheek     Nose: Nose normal.     Mouth/Throat:     Mouth: Mucous membranes are dry.  Eyes:     Pupils: Pupils are equal, round, and reactive to light.  Neck:     Comments: immoblized Cardiovascular:     Rate and Rhythm: Normal rate and regular rhythm.  Pulmonary:     Effort: Pulmonary effort is normal.     Breath sounds: Normal breath sounds.  Abdominal:     General: Abdomen is flat.     Palpations: Abdomen is soft.  Musculoskeletal: Normal range of motion.        General: Tenderness and signs of injury present.     Comments: Right hand with abrasions Bilateral knee abrasions Full arom bilateral knees, hips Diffuse ttp bilateral knees   Skin:    Capillary Refill: Capillary refill takes less than 2 seconds.  Neurological:     General: No focal deficit present.     Mental Status: He is alert and oriented to person, place, and time. Mental status is at baseline.  Psychiatric:        Mood and Affect: Mood normal.        Behavior: Behavior normal.      ED Treatments / Results  Labs (all labs ordered are listed, but only abnormal results are displayed) Labs Reviewed - No data to display  EKG EKG Interpretation  Date/Time:  Saturday August 01 2018 09:23:29 EST Ventricular Rate:  92 PR Interval:    QRS Duration: 152 QT Interval:  411 QTC Calculation: 509 R Axis:   173 Text Interpretation:  Normal sinus rhythm Right bundle branch block No significant change since last tracing Confirmed by Pattricia Boss [phone]) on 08/01/2018 2:36:49 PM   Radiology Dg Ribs Unilateral W/chest Right  Result Date: 08/01/2018 CLINICAL DATA:  Pain after fall EXAM: RIGHT RIBS AND CHEST - 3+ VIEW COMPARISON:  September 22, 2017 FINDINGS: The heart size borderline. The hila and mediastinum are unchanged. No pneumothorax. Mild bibasilar opacities, likely atelectasis. No other acute abnormalities. No rib fractures noted. IMPRESSION: No rib fractures noted. Electronically  Signed   By: Dorise Bullion III M.D   On: 08/01/2018 11:02   Dg Thoracic Spine 2 View  Result Date: 08/01/2018 CLINICAL DATA:  Pain after fall EXAM: THORACIC SPINE 2 VIEWS COMPARISON:  None. FINDINGS: There is no evidence of thoracic spine fracture. Alignment is normal. No other significant bone abnormalities are identified. IMPRESSION: Negative. Electronically Signed   By: Dorise Bullion III  M.D   On: 08/01/2018 11:03   Dg Knee 2 Views Left  Result Date: 08/01/2018 CLINICAL DATA:  Pain after fall EXAM: LEFT KNEE - 1-2 VIEW COMPARISON:  None. FINDINGS: The patient is status post knee replacement. Hardware is in good position. There appears to be a small joint effusion. Apparent loose bodies in the joint. No acute fractures. IMPRESSION: Small joint effusion. Loose bodies in the joint. No acute fracture. Electronically Signed   By: Dorise Bullion III M.D   On: 08/01/2018 10:59   Dg Knee 2 Views Right  Result Date: 08/01/2018 CLINICAL DATA:  Pain after fall EXAM: RIGHT KNEE - 1-2 VIEW COMPARISON:  None. FINDINGS: No evidence of fracture, dislocation, or joint effusion. No evidence of arthropathy or other focal bone abnormality. Soft tissues are unremarkable. IMPRESSION: Negative. Electronically Signed   By: Dorise Bullion III M.D   On: 08/01/2018 11:00   Ct Head Wo Contrast  Result Date: 08/01/2018 CLINICAL DATA:  Fall with laceration over left forehead.  Pain. EXAM: CT HEAD WITHOUT CONTRAST CT CERVICAL SPINE WITHOUT CONTRAST TECHNIQUE: Multidetector CT imaging of the head and cervical spine was performed following the standard protocol without intravenous contrast. Multiplanar CT image reconstructions of the cervical spine were also generated. COMPARISON:  None. FINDINGS: CT HEAD FINDINGS Brain: No evidence of acute infarction, hemorrhage, hydrocephalus, extra-axial collection or mass lesion/mass effect. Vascular: No hyperdense vessel or unexpected calcification. Skull: Normal. Negative for  fracture or focal lesion. Sinuses/Orbits: No acute finding. Other: Laceration over the forehead. Extracranial soft tissues are otherwise normal. CT CERVICAL SPINE FINDINGS Alignment: Normal. Skull base and vertebrae: No acute fracture. No primary bone lesion or focal pathologic process. Soft tissues and spinal canal: No prevertebral fluid or swelling. No visible canal hematoma. Disc levels:  Multilevel degenerative changes. Upper chest: Negative. Other: No other abnormalities. IMPRESSION: 1. Laceration involving the forehead. No acute intracranial abnormalities. 2. No fracture or traumatic malalignment in the cervical spine. Electronically Signed   By: Dorise Bullion III M.D   On: 08/01/2018 10:55   Ct Angio Chest Pe W And/or Wo Contrast  Result Date: 08/01/2018 CLINICAL DATA:  Syncopal episode today walking down the driveway, history of pulmonary embolism with similar presentation, elevated D-dimer EXAM: CT ANGIOGRAPHY CHEST WITH CONTRAST TECHNIQUE: Multidetector CT imaging of the chest was performed using the standard protocol during bolus administration of intravenous contrast. Multiplanar CT image reconstructions and MIPs were obtained to evaluate the vascular anatomy. CONTRAST:  155m ISOVUE-370 IOPAMIDOL (ISOVUE-370) INJECTION 76% IV COMPARISON:  04/15/2018, 08/24/2017 FINDINGS: Cardiovascular: Scattered atherosclerotic calcifications aorta, proximal great vessels and coronary arteries. Heart appears enlarged. No pericardial effusion. Aorta normal caliber. Pulmonary arteries adequately opacified and patent. No evidence of pulmonary embolism. Mediastinum/Nodes: Base of cervical region normal appearance. No thoracic adenopathy. Esophagus unremarkable. Small hiatal hernia. Lungs/Pleura: Tiny dependent BILATERAL pleural effusions and bibasilar atelectasis. Underlying emphysematous changes. No acute infiltrate, pleural effusion, or pneumothorax. 3 mm RIGHT lower lobe nodule image 102 unchanged. Upper Abdomen:  Gallbladder surgically absent. Remaining visualized upper abdomen unremarkable. Musculoskeletal: No acute osseous findings. Review of the MIP images confirms the above findings. IMPRESSION: No evidence pulmonary embolism. Dependent bibasilar atelectasis and tiny pleural effusions. Emphysematous changes with stable 3 mm nodule since 2016. Small hiatal hernia. Aortic Atherosclerosis (ICD10-I70.0) and Emphysema (ICD10-J43.9). Electronically Signed   By: MLavonia DanaM.D.   On: 08/01/2018 14:04   Ct Cervical Spine Wo Contrast  Result Date: 08/01/2018 CLINICAL DATA:  Fall with laceration over left forehead.  Pain.  EXAM: CT HEAD WITHOUT CONTRAST CT CERVICAL SPINE WITHOUT CONTRAST TECHNIQUE: Multidetector CT imaging of the head and cervical spine was performed following the standard protocol without intravenous contrast. Multiplanar CT image reconstructions of the cervical spine were also generated. COMPARISON:  None. FINDINGS: CT HEAD FINDINGS Brain: No evidence of acute infarction, hemorrhage, hydrocephalus, extra-axial collection or mass lesion/mass effect. Vascular: No hyperdense vessel or unexpected calcification. Skull: Normal. Negative for fracture or focal lesion. Sinuses/Orbits: No acute finding. Other: Laceration over the forehead. Extracranial soft tissues are otherwise normal. CT CERVICAL SPINE FINDINGS Alignment: Normal. Skull base and vertebrae: No acute fracture. No primary bone lesion or focal pathologic process. Soft tissues and spinal canal: No prevertebral fluid or swelling. No visible canal hematoma. Disc levels:  Multilevel degenerative changes. Upper chest: Negative. Other: No other abnormalities. IMPRESSION: 1. Laceration involving the forehead. No acute intracranial abnormalities. 2. No fracture or traumatic malalignment in the cervical spine. Electronically Signed   By: Dorise Bullion III M.D   On: 08/01/2018 10:55   Dg Hand 2 View Right  Result Date: 08/01/2018 CLINICAL DATA:  Pain after  fall EXAM: RIGHT HAND - 2 VIEW COMPARISON:  None. FINDINGS: Degenerative changes between the base of the first metacarpal and trapezium. No acute fractures. IMPRESSION: No acute fractures. Electronically Signed   By: Dorise Bullion III M.D   On: 08/01/2018 10:58    Procedures Procedures (including critical care time)  Medications Ordered in ED Medications - No data to display   Initial Impression / Assessment and Plan / ED Course  I have reviewed the triage vital signs and the nursing notes.  Pertinent labs & imaging results that were available during my care of the patient were reviewed by me and considered in my medical decision making (see chart for details).     This is an 82 year old man who presents today after a fall. He states he was outside getting the newspaper this morning walking on an incline on the wet driveway and fell.  However, he is unable to definitively remember the fall and his wife thinks that he had a syncopal episode.  He states that the last time he had a syncopal episode he had a PE and he had an elevated d-dimer here.  Secondary to the d-dimer, a CT angiogram was obtained.  Review of records revealed another time he had a syncopal episode, he had an infection.  Here, there is no sign of infection.  He has had normal vital signs, normal white blood cell count, and a CT of the chest was obtained that showed no evidence of infiltrate.  He has had cough at baseline but no other infection symptoms.  His oxygen saturations have been 89 to 95% here.  He has previously been oxygen dependent.  He was offered oxygen but refuses.  I had an extended conversation with the patient, his wife, his brother, and daughter.  Family feels that he should be further evaluated for his syncope.  Patient adamantly refuses.  I have discussed that they can return to the ED at any time. The fall and injuries were evaluated with CT of the head, neck, plain x-rays of the knees, and hand.  He also had  x-rays obtained of his thoracic spine and his chest due to some complaints of upper back pain.  No evidence of fracture or other internal injury was seen.  Patient had laceration to the face has been repaired here in the ED.  He received tetanus here in the  ED. Patient now states that he would restart oxygen.  He is given a home health care referral Final Clinical Impressions(s) / ED Diagnoses   Final diagnoses:  Fall, initial encounter  Facial laceration, initial encounter  Abrasion of right knee, initial encounter    ED Discharge Orders    None       Pattricia Boss, MD 08/01/18 1505

## 2018-08-01 NOTE — ED Provider Notes (Signed)
I was requested to perform laceration repair of R forehead.  LACERATION REPAIR Performed by: Domenic Moras Authorized by: Domenic Moras Consent: Verbal consent obtained. Risks and benefits: risks, benefits and alternatives were discussed Consent given by: patient Patient identity confirmed: provided demographic data Prepped and Draped in normal sterile fashion Wound explored  Laceration Location: R forehead  Laceration Length: 5cm  No Foreign Bodies seen or palpated  Anesthesia: local infiltration  Local anesthetic: lidocaine 2% w epinephrine  Anesthetic total: 8 ml  Irrigation method: syringe Amount of cleaning: standard  Skin closure: prolene 5.0  Number of sutures: 9  Technique: simple interrupted  Patient tolerance: Patient tolerated the procedure well with no immediate complications.    Domenic Moras, PA-C 08/01/18 1301    Pattricia Boss, MD 08/02/18 [phone]

## 2018-08-01 NOTE — ED Notes (Signed)
Patient transported to CT 

## 2018-08-01 NOTE — Discharge Instructions (Addendum)
Do not exert yourself Home health should be in contact regarding oxygen REturn to the ED if worse at any time Have sutures removed in 10 days

## 2018-08-05 ENCOUNTER — Encounter: Payer: Self-pay | Admitting: Nurse Practitioner

## 2018-08-05 ENCOUNTER — Ambulatory Visit (INDEPENDENT_AMBULATORY_CARE_PROVIDER_SITE_OTHER): Payer: Medicare Other | Admitting: Nurse Practitioner

## 2018-08-05 DIAGNOSIS — J449 Chronic obstructive pulmonary disease, unspecified: Secondary | ICD-10-CM

## 2018-08-05 DIAGNOSIS — I5032 Chronic diastolic (congestive) heart failure: Secondary | ICD-10-CM

## 2018-08-05 NOTE — Assessment & Plan Note (Addendum)
Patient appears to be stable in office today.   Patient Instructions  Continue Spiriva Continue Proventil as needed Weight gain noted - please take an extra lasix for the next 3 days and call cardiology with update Patient walked in office today - sats were 85% on RA  - this is your baseline - please check sats at home if 85% or higher then no need for oxygen. Follow up with [name] at first available appointment (approximately 4-6 weeks) Please call if symptoms worsen or O2 sats drop

## 2018-08-05 NOTE — Progress Notes (Signed)
_0  ID: Joshua [name], male    DOB: May 20, 1936, 82 y.o.   MRN: [id number]  Chief Complaint  Patient presents with  . Hospitalization Follow-up    Referring provider: Jani Gravel, MD  HPI  82 year old former smoker with COPD and pulmonary hypertension followed by [name]. PMH includes PE postop after a knee replacement in 2016, hospitalized for acute hypoxic respiratory failure in January 2836, diastolic heart failure.   Tests: CT angiogram 08/2017 was reviewed which showed left upper lobe nodule 9x6 mm. 09/15/14 CT chest showed a large bilateral pulmonary embolism with RV dilatation.  Echo showed normal LV function with RVSP 73 mm Echo 12/2017 RVSP 39  NPSG 02/2018 >> no OSA, 45 mins desatn, TST 5.5 h  08/2014 Venous Doppler showed no DVT  PFTs 11/2014 - FEV1 59%, no BD response, ratio 70, TLC 68%, DLCO 35%  OV 08/05/18 - Hospital follow up 08/01/18 Patient presents for hospital follow-up.  He went to the ED on 08/01/2018 after falling in his driveway.  He states that he was walking outside to get the newspaper that morning on an incline on a wet driveway and fell.  He states that he thinks he stubbed his toe and tripped.  It is unclear if he had a syncopal episode.  He does have a history of a syncopal episode.  He fell face down and his glasses cut his forehead.  He had a laceration repair to the right forehead in the ED with 9 sutures placed.  He has a follow-up appointment on Monday to have the sutures removed his PCP.  He states that overall he has been doing well.  He gets short of breath at times but states that this is his baseline.  He is compliant with medications.  He takes his Spiriva daily.  He has not needed to use his Proventil.  He denies any shortness of breath, chest pain, fever.  He does have some peripheral edema and has had weight gain since he left the hospital.   Allergies  Allergen Reactions  . Other     Beer- Swelling   . Sunflower Oil Swelling    . Sulfa Antibiotics Rash    Immunization History  Administered Date(s) Administered  . Influenza, High Dose Seasonal PF 05/27/2017  . Influenza,inj,Quad PF,6+ Mos 07/19/2014, 05/29/2015, 05/07/2018  . Influenza-Unspecified 05/19/2016  . Pneumococcal Polysaccharide-23 12/29/2008  . Tdap 08/01/2018    Past Medical History:  Diagnosis Date  . Arthritis   . BPH (benign prostatic hyperplasia)   . COPD (chronic obstructive pulmonary disease) ([place])   . Folliculitis    posterior scalp per office visit note of Dr Maudie Mercury 07/20/2014    . GERD (gastroesophageal reflux disease)   . Hypertension   . PE (pulmonary thromboembolism) ([place])   . Phlebitis    right arm  at least 20 years ago   . Pneumonia    hx of pneumonia as a child     Tobacco History: Social History   Tobacco Use  Smoking Status Former Smoker  . Packs/day: 1.00  . Years: 50.00  . Pack years: 50.00  . Types: Cigarettes  . Last attempt to quit: 08/19/2006  . Years since quitting: 11.9  Smokeless Tobacco Never Used   Counseling given: Not Answered   Outpatient Encounter Medications as of 08/05/2018  Medication Sig  . albuterol (PROVENTIL HFA;VENTOLIN HFA) 108 (90 Base) MCG/ACT inhaler Inhale 1-2 puffs into the lungs every 6 (six) hours as needed for wheezing or  shortness of breath.  . allopurinol (ZYLOPRIM) 100 MG tablet Take 100 mg by mouth daily.  Marland Kitchen aspirin 81 MG tablet Take 81 mg by mouth daily.  . Cinnamon 500 MG capsule Take 500 mg by mouth daily.  Marland Kitchen doxazosin (CARDURA) 4 MG tablet Take 4 mg by mouth at bedtime.   . finasteride (PROSCAR) 5 MG tablet Take 5 mg by mouth at bedtime.  . furosemide (LASIX) 40 MG tablet Take 1 tablet (40 mg total) by mouth daily.  . pantoprazole (PROTONIX) 40 MG tablet Take 40 mg by mouth daily.  . ramipril (ALTACE) 2.5 MG capsule Take 2.5 mg by mouth daily.  Marland Kitchen SPIRIVA RESPIMAT 1.25 MCG/ACT AERS INHALE 2 PUFFS INTO THE LUNGS DAILY. (Patient taking differently: Inhale 2 puffs into the  lungs daily. )   No facility-administered encounter medications on file as of 08/05/2018.      Review of Systems  Review of Systems  Constitutional: Negative.  Negative for chills and fever.  HENT: Negative.   Respiratory: Positive for shortness of breath. Negative for cough and wheezing.   Cardiovascular: Positive for leg swelling. Negative for chest pain and palpitations.  Gastrointestinal: Negative.   Allergic/Immunologic: Negative.   Neurological: Negative.   Psychiatric/Behavioral: Negative.        Physical Exam  BP 116/70 (BP Location: Right Arm, Patient Position: Sitting, Cuff Size: Normal)   Pulse 91   Ht _0  (1.88 m)   Wt 263 lb 6.4 oz (119.5 kg)   SpO2 97%   BMI 33.82 kg/m   Wt Readings from Last 5 Encounters:  08/05/18 263 lb 6.4 oz (119.5 kg)  08/01/18 250 lb (113.4 kg)  07/14/18 259 lb 12.8 oz (117.8 kg)  05/07/18 253 lb (114.8 kg)  03/13/18 250 lb (113.4 kg)     Physical Exam Vitals signs and nursing note reviewed.  Constitutional:      General: He is not in acute distress.    Appearance: He is well-developed.  HENT:     Head:      Comments: Patient has 9 sutures to the right forehead.  Sutures are clean and intact.  No signs of infection.  To be healing well. Cardiovascular:     Rate and Rhythm: Normal rate and regular rhythm.  Pulmonary:     Effort: Pulmonary effort is normal. No respiratory distress.     Breath sounds: Normal breath sounds. No wheezing or rhonchi.  Musculoskeletal:        General: Swelling (1+ bilateral) present.  Skin:    General: Skin is warm and dry.  Neurological:     Mental Status: He is alert and oriented to person, place, and time.     Imaging: Dg Ribs Unilateral W/chest Right  Result Date: 08/01/2018 CLINICAL DATA:  Pain after fall EXAM: RIGHT RIBS AND CHEST - 3+ VIEW COMPARISON:  September 22, 2017 FINDINGS: The heart size borderline. The hila and mediastinum are unchanged. No pneumothorax. Mild bibasilar  opacities, likely atelectasis. No other acute abnormalities. No rib fractures noted. IMPRESSION: No rib fractures noted. Electronically Signed   By: Dorise Bullion III M.D   On: 08/01/2018 11:02   Dg Thoracic Spine 2 View  Result Date: 08/01/2018 CLINICAL DATA:  Pain after fall EXAM: THORACIC SPINE 2 VIEWS COMPARISON:  None. FINDINGS: There is no evidence of thoracic spine fracture. Alignment is normal. No other significant bone abnormalities are identified. IMPRESSION: Negative. Electronically Signed   By: Dorise Bullion III M.D   On: 08/01/2018 11:03  Dg Knee 2 Views Left  Result Date: 08/01/2018 CLINICAL DATA:  Pain after fall EXAM: LEFT KNEE - 1-2 VIEW COMPARISON:  None. FINDINGS: The patient is status post knee replacement. Hardware is in good position. There appears to be a small joint effusion. Apparent loose bodies in the joint. No acute fractures. IMPRESSION: Small joint effusion. Loose bodies in the joint. No acute fracture. Electronically Signed   By: Dorise Bullion III M.D   On: 08/01/2018 10:59   Dg Knee 2 Views Right  Result Date: 08/01/2018 CLINICAL DATA:  Pain after fall EXAM: RIGHT KNEE - 1-2 VIEW COMPARISON:  None. FINDINGS: No evidence of fracture, dislocation, or joint effusion. No evidence of arthropathy or other focal bone abnormality. Soft tissues are unremarkable. IMPRESSION: Negative. Electronically Signed   By: Dorise Bullion III M.D   On: 08/01/2018 11:00   Ct Head Wo Contrast  Result Date: 08/01/2018 CLINICAL DATA:  Fall with laceration over left forehead.  Pain. EXAM: CT HEAD WITHOUT CONTRAST CT CERVICAL SPINE WITHOUT CONTRAST TECHNIQUE: Multidetector CT imaging of the head and cervical spine was performed following the standard protocol without intravenous contrast. Multiplanar CT image reconstructions of the cervical spine were also generated. COMPARISON:  None. FINDINGS: CT HEAD FINDINGS Brain: No evidence of acute infarction, hemorrhage, hydrocephalus,  extra-axial collection or mass lesion/mass effect. Vascular: No hyperdense vessel or unexpected calcification. Skull: Normal. Negative for fracture or focal lesion. Sinuses/Orbits: No acute finding. Other: Laceration over the forehead. Extracranial soft tissues are otherwise normal. CT CERVICAL SPINE FINDINGS Alignment: Normal. Skull base and vertebrae: No acute fracture. No primary bone lesion or focal pathologic process. Soft tissues and spinal canal: No prevertebral fluid or swelling. No visible canal hematoma. Disc levels:  Multilevel degenerative changes. Upper chest: Negative. Other: No other abnormalities. IMPRESSION: 1. Laceration involving the forehead. No acute intracranial abnormalities. 2. No fracture or traumatic malalignment in the cervical spine. Electronically Signed   By: Dorise Bullion III M.D   On: 08/01/2018 10:55   Ct Angio Chest Pe W And/or Wo Contrast  Result Date: 08/01/2018 CLINICAL DATA:  Syncopal episode today walking down the driveway, history of pulmonary embolism with similar presentation, elevated D-dimer EXAM: CT ANGIOGRAPHY CHEST WITH CONTRAST TECHNIQUE: Multidetector CT imaging of the chest was performed using the standard protocol during bolus administration of intravenous contrast. Multiplanar CT image reconstructions and MIPs were obtained to evaluate the vascular anatomy. CONTRAST:  121m ISOVUE-370 IOPAMIDOL (ISOVUE-370) INJECTION 76% IV COMPARISON:  04/15/2018, 08/24/2017 FINDINGS: Cardiovascular: Scattered atherosclerotic calcifications aorta, proximal great vessels and coronary arteries. Heart appears enlarged. No pericardial effusion. Aorta normal caliber. Pulmonary arteries adequately opacified and patent. No evidence of pulmonary embolism. Mediastinum/Nodes: Base of cervical region normal appearance. No thoracic adenopathy. Esophagus unremarkable. Small hiatal hernia. Lungs/Pleura: Tiny dependent BILATERAL pleural effusions and bibasilar atelectasis. Underlying  emphysematous changes. No acute infiltrate, pleural effusion, or pneumothorax. 3 mm RIGHT lower lobe nodule image 102 unchanged. Upper Abdomen: Gallbladder surgically absent. Remaining visualized upper abdomen unremarkable. Musculoskeletal: No acute osseous findings. Review of the MIP images confirms the above findings. IMPRESSION: No evidence pulmonary embolism. Dependent bibasilar atelectasis and tiny pleural effusions. Emphysematous changes with stable 3 mm nodule since 2016. Small hiatal hernia. Aortic Atherosclerosis (ICD10-I70.0) and Emphysema (ICD10-J43.9). Electronically Signed   By: MLavonia DanaM.D.   On: 08/01/2018 14:04   Ct Cervical Spine Wo Contrast  Result Date: 08/01/2018 CLINICAL DATA:  Fall with laceration over left forehead.  Pain. EXAM: CT HEAD WITHOUT CONTRAST CT CERVICAL SPINE  WITHOUT CONTRAST TECHNIQUE: Multidetector CT imaging of the head and cervical spine was performed following the standard protocol without intravenous contrast. Multiplanar CT image reconstructions of the cervical spine were also generated. COMPARISON:  None. FINDINGS: CT HEAD FINDINGS Brain: No evidence of acute infarction, hemorrhage, hydrocephalus, extra-axial collection or mass lesion/mass effect. Vascular: No hyperdense vessel or unexpected calcification. Skull: Normal. Negative for fracture or focal lesion. Sinuses/Orbits: No acute finding. Other: Laceration over the forehead. Extracranial soft tissues are otherwise normal. CT CERVICAL SPINE FINDINGS Alignment: Normal. Skull base and vertebrae: No acute fracture. No primary bone lesion or focal pathologic process. Soft tissues and spinal canal: No prevertebral fluid or swelling. No visible canal hematoma. Disc levels:  Multilevel degenerative changes. Upper chest: Negative. Other: No other abnormalities. IMPRESSION: 1. Laceration involving the forehead. No acute intracranial abnormalities. 2. No fracture or traumatic malalignment in the cervical spine.  Electronically Signed   By: Dorise Bullion III M.D   On: 08/01/2018 10:55   Dg Hand 2 View Right  Result Date: 08/01/2018 CLINICAL DATA:  Pain after fall EXAM: RIGHT HAND - 2 VIEW COMPARISON:  None. FINDINGS: Degenerative changes between the base of the first metacarpal and trapezium. No acute fractures. IMPRESSION: No acute fractures. Electronically Signed   By: Dorise Bullion III M.D   On: 08/01/2018 10:58     Assessment & Plan:   COPD (chronic obstructive pulmonary disease) ([place]) Patient appears to be stable in office today.   Patient Instructions  Continue Spiriva Continue Proventil as needed Weight gain noted - please take an extra lasix for the next 3 days and call cardiology with update Patient walked in office today - sats were 85% on RA  - this is your baseline - please check sats at home if 85% or higher then no need for oxygen. Follow up with [name] at first available appointment (approximately 4-6 weeks) Please call if symptoms worsen or O2 sats drop     Diastolic CHF [facility]) Patient does have peripheral edema today.  He does state that he has shortness of breath although he states that this is his baseline.  He has had a weight gain since discharge from the hospital.  Patient Instructions  Continue Spiriva Continue Proventil as needed Weight gain noted - please take an extra lasix for the next 3 days and call cardiology with update Patient walked in office today - sats were 85% on RA  - this is your baseline - please check sats at home if 85% or higher then no need for oxygen. Follow up with [name] at first available appointment (approximately 4-6 weeks) Please call if symptoms worsen or O2 sats drop        Fenton Foy, NP 08/06/2018

## 2018-08-05 NOTE — Patient Instructions (Addendum)
Continue Spiriva Continue Proventil as needed Weight gain noted - please take an extra lasix for the next 3 days and call cardiology with update Patient walked in office today - sats were 85% on RA  - this is your baseline - please check sats at home if 85% or higher then no need for oxygen. Follow up with [name] at first available appointment (approximately 4-6 weeks) Please call if symptoms worsen or O2 sats drop

## 2018-08-06 ENCOUNTER — Encounter: Payer: Self-pay | Admitting: Nurse Practitioner

## 2018-08-06 NOTE — Assessment & Plan Note (Signed)
Patient does have peripheral edema today.  He does state that he has shortness of breath although he states that this is his baseline.  He has had a weight gain since discharge from the hospital.  Patient Instructions  Continue Spiriva Continue Proventil as needed Weight gain noted - please take an extra lasix for the next 3 days and call cardiology with update Patient walked in office today - sats were 85% on RA  - this is your baseline - please check sats at home if 85% or higher then no need for oxygen. Follow up with [name] at first available appointment (approximately 4-6 weeks) Please call if symptoms worsen or O2 sats drop

## 2018-08-10 DIAGNOSIS — R55 Syncope and collapse: Secondary | ICD-10-CM | POA: Diagnosis not present

## 2018-08-10 DIAGNOSIS — R609 Edema, unspecified: Secondary | ICD-10-CM | POA: Diagnosis not present

## 2018-08-10 DIAGNOSIS — Z4802 Encounter for removal of sutures: Secondary | ICD-10-CM | POA: Diagnosis not present

## 2018-08-10 DIAGNOSIS — W19XXXD Unspecified fall, subsequent encounter: Secondary | ICD-10-CM | POA: Diagnosis not present

## 2018-08-17 DIAGNOSIS — Z79899 Other long term (current) drug therapy: Secondary | ICD-10-CM | POA: Diagnosis not present

## 2018-08-17 DIAGNOSIS — R609 Edema, unspecified: Secondary | ICD-10-CM | POA: Diagnosis not present

## 2018-08-17 DIAGNOSIS — M1A0790 Idiopathic chronic gout, unspecified ankle and foot, without tophus (tophi): Secondary | ICD-10-CM | POA: Diagnosis not present

## 2018-08-21 DIAGNOSIS — R351 Nocturia: Secondary | ICD-10-CM | POA: Diagnosis not present

## 2018-08-21 DIAGNOSIS — N401 Enlarged prostate with lower urinary tract symptoms: Secondary | ICD-10-CM | POA: Diagnosis not present

## 2018-09-10 ENCOUNTER — Encounter: Payer: Self-pay | Admitting: Pulmonary Disease

## 2018-09-10 ENCOUNTER — Ambulatory Visit (INDEPENDENT_AMBULATORY_CARE_PROVIDER_SITE_OTHER): Payer: Medicare Other | Admitting: Pulmonary Disease

## 2018-09-10 DIAGNOSIS — J449 Chronic obstructive pulmonary disease, unspecified: Secondary | ICD-10-CM | POA: Diagnosis not present

## 2018-09-10 DIAGNOSIS — I5032 Chronic diastolic (congestive) heart failure: Secondary | ICD-10-CM | POA: Diagnosis not present

## 2018-09-10 NOTE — Progress Notes (Signed)
   Subjective:    Patient ID: Joshua [name], male    DOB: 08-09-1936, 83 y.o.   MRN: [id number]  HPI  72 yoformer smokerfor FU of COPD & pulmonary hypertension He has h/oPE postop after a knee replacement in 2016. He was hospitalized 08/2017 for 3 days for acute hypoxic respiratory failure, attributed to diastolic heart failure. He was diuresed 2.5 L, had elevated troponin, follow-up stress Myoview was low risk for ischemia.    Chief Complaint  Patient presents with  . Follow-up    SpO2 staying mostly above 90%. Sob slightly better,no cough.    He had a fall around December, sustained head laceration, CT angiogram was negative for pulmonary embolism, oxygen saturation was noted to be 85%, he had developed bipedal edema, Lasix was increased to 40 mg daily and he feels much better, oxygen saturation is 97% today.  His weight has gone down from 263 to 250 pounds  In the past, he has been tried on Brio,anoro & trelegynone of these seem to provide him any symptomatic relief -he has settled on Spiriva and seems to prefer this.  He stays active and still works for [employer]  Significant tests/ events reviewed Echo 12/2017 RVSP 39 NPSG 02/2018 >> no OSA, 45 mins desatn, TST 5.5 h  09/15/14 CT chest showed a large bilateral pulmonary embolism with RV dilatation. 08/2014 Venous Doppler showed no DVT   PFTs 11/2014 - FEV1 59%, no BD response, ratio 70, TLC 68%, DLCO 35%   CT angiogram 08/2017 left upper lobe nodule 9x6 mm.   CT 03/2018 which showed resolution of nodules follow-up echo 12/2017 which showed that PA pressures have decreased.  Review of Systems Patient denies significant dyspnea,cough, hemoptysis,  chest pain, palpitations, pedal edema, orthopnea, paroxysmal nocturnal dyspnea, lightheadedness, nausea, vomiting, abdominal or  leg pains      Objective:   Physical Exam  Gen. Pleasant, well-nourished, in no distress ENT - no thrush, no pallor/icterus,no  post nasal drip Neck: No JVD, no thyromegaly, no carotid bruits Lungs: no use of accessory muscles, no dullness to percussion, clear without rales or rhonchi  Cardiovascular: Rhythm regular, heart sounds  normal, no murmurs or gallops, no peripheral edema Musculoskeletal: No deformities, no cyanosis or clubbing        Assessment & Plan:

## 2018-09-10 NOTE — Assessment & Plan Note (Signed)
Stay on Lasix 40 mg daily. Variable pulmonary hypertension and pedal edema likely related to cor pulmonale versus chronic diastolic heart failure

## 2018-09-10 NOTE — Assessment & Plan Note (Signed)
Continue Spiriva 

## 2018-09-10 NOTE — Patient Instructions (Signed)
No need for oxygen at this time. Stay on Spiriva. Stay on Lasix 40 mg daily and call us if you have fluid buildup again

## 2018-11-05 ENCOUNTER — Ambulatory Visit: Payer: Medicare Other | Admitting: Primary Care

## 2018-12-07 DIAGNOSIS — M109 Gout, unspecified: Secondary | ICD-10-CM | POA: Diagnosis not present

## 2018-12-07 DIAGNOSIS — I10 Essential (primary) hypertension: Secondary | ICD-10-CM | POA: Diagnosis not present

## 2018-12-07 DIAGNOSIS — E7800 Pure hypercholesterolemia, unspecified: Secondary | ICD-10-CM | POA: Diagnosis not present

## 2018-12-07 DIAGNOSIS — E119 Type 2 diabetes mellitus without complications: Secondary | ICD-10-CM | POA: Diagnosis not present

## 2018-12-14 DIAGNOSIS — M109 Gout, unspecified: Secondary | ICD-10-CM | POA: Diagnosis not present

## 2018-12-14 DIAGNOSIS — E119 Type 2 diabetes mellitus without complications: Secondary | ICD-10-CM | POA: Diagnosis not present

## 2018-12-14 DIAGNOSIS — K219 Gastro-esophageal reflux disease without esophagitis: Secondary | ICD-10-CM | POA: Diagnosis not present

## 2018-12-14 DIAGNOSIS — J449 Chronic obstructive pulmonary disease, unspecified: Secondary | ICD-10-CM | POA: Diagnosis not present

## 2018-12-14 DIAGNOSIS — I10 Essential (primary) hypertension: Secondary | ICD-10-CM | POA: Diagnosis not present

## 2018-12-14 DIAGNOSIS — E7800 Pure hypercholesterolemia, unspecified: Secondary | ICD-10-CM | POA: Diagnosis not present

## 2018-12-14 DIAGNOSIS — L739 Follicular disorder, unspecified: Secondary | ICD-10-CM | POA: Diagnosis not present

## 2019-01-10 ENCOUNTER — Other Ambulatory Visit: Payer: Self-pay | Admitting: Pulmonary Disease

## 2019-01-12 ENCOUNTER — Telehealth: Payer: Self-pay | Admitting: Physician Assistant

## 2019-01-12 ENCOUNTER — Encounter: Payer: Self-pay | Admitting: Primary Care

## 2019-01-12 ENCOUNTER — Ambulatory Visit (INDEPENDENT_AMBULATORY_CARE_PROVIDER_SITE_OTHER): Payer: Medicare Other | Admitting: Primary Care

## 2019-01-12 ENCOUNTER — Ambulatory Visit: Payer: Medicare Other | Admitting: Nurse Practitioner

## 2019-01-12 ENCOUNTER — Other Ambulatory Visit: Payer: Self-pay

## 2019-01-12 DIAGNOSIS — J449 Chronic obstructive pulmonary disease, unspecified: Secondary | ICD-10-CM | POA: Diagnosis not present

## 2019-01-12 NOTE — Patient Instructions (Addendum)
COPD Continue Spirivia 1 puff daily  Continue as needed albuterol rescue inhaler   Diastolic heart failure Continue Lasix 31m daily  Follow-up 3-4 months with Dr. AElsworth Sohoor sooner if needed   COPD and Physical Activity Chronic obstructive pulmonary disease (COPD) is a long-term (chronic) condition that affects the lungs. COPD is a general term that can be used to describe many different lung problems that cause lung swelling (inflammation) and limit airflow, including chronic bronchitis and emphysema. The main symptom of COPD is shortness of breath, which makes it harder to do even simple tasks. This can also make it harder to exercise and be active. Talk with your health care provider about treatments to help you breathe better and actions you can take to prevent breathing problems during physical activity. What are the benefits of exercising with COPD? Exercising regularly is an important part of a healthy lifestyle. You can still exercise and do physical activities even though you have COPD. Exercise and physical activity improve your shortness of breath by increasing blood flow (circulation). This causes your heart to pump more oxygen through your body. Moderate exercise can improve your:  Oxygen use.  Energy level.  Shortness of breath.  Strength in your breathing muscles.  Heart health.  Sleep.  Self-esteem and feelings of self-worth.  Depression, stress, and anxiety levels. Exercise can benefit everyone with COPD. The severity of your disease may affect how hard you can exercise, especially at first, but everyone can benefit. Talk with your health care provider about how much exercise is safe for you, and which activities and exercises are safe for you. What actions can I take to prevent breathing problems during physical activity?  Sign up for a pulmonary rehabilitation program. This type of program may include: ? Education about lung diseases. ? Exercise classes that  teach you how to exercise and be more active while improving your breathing. This usually involves:  Exercise using your lower extremities, such as a stationary bicycle.  About 30 minutes of exercise, 2 to 5 times per week, for 6 to 12 weeks  Strength training, such as push ups or leg lifts. ? Nutrition education. ? Group classes in which you can talk with others who also have COPD and learn ways to manage stress.  If you use an oxygen tank, you should use it while you exercise. Work with your health care provider to adjust your oxygen for your physical activity. Your resting flow rate is different from your flow rate during physical activity.  While you are exercising: ? Take slow breaths. ? Pace yourself and do not try to go too fast. ? Purse your lips while breathing out. Pursing your lips is similar to a kissing or whistling position. ? If doing exercise that uses a quick burst of effort, such as weight lifting:  Breathe in before starting the exercise.  Breathe out during the hardest part of the exercise (such as raising the weights). Where to find support You can find support for exercising with COPD from:  Your health care provider.  A pulmonary rehabilitation program.  Your local health department or community health programs.  Support groups, online or in-person. Your health care provider may be able to recommend support groups. Where to find more information You can find more information about exercising with COPD from:  American Lung Association: lClassInsider.se  COPD Foundation: chttps://www.rivera.net/ Contact a health care provider if:  Your symptoms get worse.  You have chest pain.  You have nausea.  You have a fever.  You have trouble talking or catching your breath.  You want to start a new exercise program or a new activity. Summary  COPD is a general term that can be used to describe many different lung problems that cause lung swelling (inflammation) and  limit airflow. This includes chronic bronchitis and emphysema.  Exercise and physical activity improve your shortness of breath by increasing blood flow (circulation). This causes your heart to provide more oxygen to your body.  Contact your health care provider before starting any exercise program or new activity. Ask your health care provider what exercises and activities are safe for you. This information is not intended to replace advice given to you by your health care provider. Make sure you discuss any questions you have with your health care provider. Document Released: 08/28/2017 Document Revised: 08/28/2017 Document Reviewed: 08/28/2017 Elsevier Interactive Patient Education  2019 [place].    Heart Failure  Heart failure means your heart has trouble pumping blood. This makes it hard for your body to work well. Heart failure is usually a long-term (chronic) condition. You must take good care of yourself and follow your treatment plan from your doctor. Follow these instructions at home: Medicines  Take over-the-counter and prescription medicines only as told by your doctor. ? Do not stop taking your medicine unless your doctor told you to do that. ? Do not skip any doses. ? Refill your prescriptions before you run out of medicine. You need your medicines every day. Eating and drinking   Eat heart-healthy foods. Talk with a diet and nutrition specialist (dietitian) to make an eating plan.  Choose foods that: ? Have no trans fat. ? Are low in saturated fat and cholesterol.  Choose healthy foods, like: ? Fresh or frozen fruits and vegetables. ? Fish. ? Low-fat (lean) meats. ? Legumes (like beans, peas, and lentils). ? Fat-free or low-fat dairy products. ? Whole-grain foods. ? High-fiber foods.  Limit salt (sodium) if told by your doctor. Ask your nutrition specialist to recommend heart-healthy seasonings.  Cook in healthy ways instead of frying. Healthy ways of  cooking include: ? Roasting. ? Grilling. ? Broiling. ? Baking. ? Poaching. ? Steaming. ? Stir-frying.  Limit how much fluid you drink, if told by your doctor. Lifestyle  Do not smoke or use chewing tobacco. Do not use nicotine gum or patches before talking to your doctor.  Limit alcohol intake to no more than 1 drink a day for non-pregnant women and 2 drinks a day for men. One drink equals 12 oz of beer, 5 oz of wine, or 1 oz of hard liquor. ? Tell your doctor if you drink alcohol many times a week. ? Talk with your doctor about whether any alcohol is safe for you. ? You should stop drinking alcohol: ? If your heart has been damaged by alcohol. ? You have very bad heart failure.  Do not use illegal drugs.  Lose weight if told by your doctor.  Do moderate physical activity if told by your doctor. Ask your doctor what activities are safe for you if: ? You are of older age (elderly). ? You have very bad heart failure. Keep track of important information  Weigh yourself every day. ? Weigh yourself every morning after you pee (urinate) and before breakfast. ? Wear the same amount of clothing each time. ? Write down your daily weight. Give your record to your doctor.  Check and write down your blood pressure as told  by your doctor.  Check your pulse as told by your doctor. Dealing with heat and cold  If the weather is very hot: ? Avoid activity that takes a lot of energy. ? Use air conditioning or fans, or find a cooler place. ? Avoid caffeine. ? Avoid alcohol. ? Wear clothing that is loose-fitting, lightweight, and light-colored.  If the weather is very cold: ? Avoid activity that takes a lot of energy. ? Layer your clothes. ? Wear mittens or gloves, a hat, and a scarf when you go outside. ? Avoid alcohol. General instructions  Manage other conditions that you have as told by your doctor.  Learn to manage stress. If you need help, ask your doctor.  Plan rest  periods for when you get tired.  Get education and support as needed.  Get rehab (rehabilitation) to help you stay independent and to help with everyday tasks.  Stay up to date with shots (immunizations), especially pneumococcal and flu (influenza) shots.  Keep all follow-up visits as told by your doctor. This is important. Contact a doctor if:  You gain weight quickly.  You are more short of breath than normal.  You cannot do your normal activities.  You tire easily.  You cough more than normal, especially with activity.  You have any or more puffiness (swelling) in areas such as your hands, feet, ankles, or belly (abdomen).  You cannot sleep because it is hard to breathe.  You feel like your heart is beating fast (palpitations).  You get dizzy or light-headed when you stand up. Get help right away if:  You have trouble breathing.  You or someone else notices a change in your awareness. This could be trouble staying awake or trouble concentrating.  You have chest pain or discomfort.  You pass out (faint). Summary  Heart failure means your heart has trouble pumping blood.  Make sure you refill your prescriptions before you run out of medicine. You need your medicines every day.  Keep records of your weight and blood pressure to give to your doctor.  Contact a doctor if you gain weight quickly. This information is not intended to replace advice given to you by your health care provider. Make sure you discuss any questions you have with your health care provider. Document Released: 05/14/2008 Document Revised: 04/29/2018 Document Reviewed: 08/27/2016 Elsevier Interactive Patient Education  2019 [employer].

## 2019-01-12 NOTE — Progress Notes (Signed)
Virtual Visit via Telephone Note  I connected with [name] on 01/12/19 at  9:30 AM EDT by telephone and verified that I am speaking with the correct person using two identifiers.  Location: Patient: Home Provider: Office   I discussed the limitations, risks, security and privacy concerns of performing an evaluation and management service by telephone and the availability of in person appointments. I also discussed with the patient that there may be a patient responsible charge related to this service. The patient expressed understanding and agreed to proceed.   History of Present Illness: 83 year old male, former smoker quit 2008. PMH significant for COPD, secondary pulmonary hypertension, diastolic CHF, HTN, PE. Patient of [name], last seen on 09/10/18. Maintained on Spiriva   01/12/2019 Patient called today for 4 month follow-up. Feels well, states that everything is baseline. No new respiratory symptoms. His lasix was increased to 49m in February. He reports that his leg swelling has improved. Able to do most all of his ADLS including dishes, grocery shopping. He was working up until [employer] Denies significant cough, wheezing or fever.    Observations/Objective:  - No shortness of breath, wheezing or cough observed during phone conversation  Assessment and Plan:  COPD - Stable interval - Continue Spiriva 1 puff daily - Continue albuterol hfa 2 puffs every 4-6 hours prn sob/wheezing  Diastolic CHF - Lasix increased to 646min Feb 2020 - Breathing stable, reports less leg swelling   Follow Up Instructions:   3-4 months with Dr. AlElsworth Sohoor sooner if needed  I discussed the assessment and treatment plan with the patient. The patient was provided an opportunity to ask questions and all were answered. The patient agreed with the plan and demonstrated an understanding of the instructions.   The patient was advised to call back or seek an in-person evaluation if the symptoms  worsen or if the condition fails to improve as anticipated.  I provided 18 minutes of non-face-to-face time during this encounter.   ElMartyn EhrichNP

## 2019-01-12 NOTE — Telephone Encounter (Signed)
Spoke with patient who confirmed all demographics. Patient has a smart phone and uses My Chart. Will have vitals ready for visit.

## 2019-01-14 ENCOUNTER — Telehealth (INDEPENDENT_AMBULATORY_CARE_PROVIDER_SITE_OTHER): Payer: Medicare Other | Admitting: Physician Assistant

## 2019-01-14 ENCOUNTER — Encounter: Payer: Self-pay | Admitting: Physician Assistant

## 2019-01-14 ENCOUNTER — Other Ambulatory Visit: Payer: Self-pay

## 2019-01-14 VITALS — BP 111/70 | HR 92 | Ht 74.0 in | Wt 244.0 lb

## 2019-01-14 DIAGNOSIS — I110 Hypertensive heart disease with heart failure: Secondary | ICD-10-CM

## 2019-01-14 DIAGNOSIS — I5032 Chronic diastolic (congestive) heart failure: Secondary | ICD-10-CM | POA: Diagnosis not present

## 2019-01-14 DIAGNOSIS — J449 Chronic obstructive pulmonary disease, unspecified: Secondary | ICD-10-CM | POA: Diagnosis not present

## 2019-01-14 DIAGNOSIS — I272 Pulmonary hypertension, unspecified: Secondary | ICD-10-CM

## 2019-01-14 DIAGNOSIS — I10 Essential (primary) hypertension: Secondary | ICD-10-CM

## 2019-01-14 NOTE — Patient Instructions (Signed)
Medication Instructions:  Your physician recommends that you continue on your current medications as directed. Please refer to the Current Medication list given to you today.  If you need a refill on your cardiac medications before your next appointment, please call your pharmacy.   Lab work: NONE If you have labs (blood work) drawn today and your tests are completely normal, you will receive your results only by: Marland Kitchen MyChart Message (if you have MyChart) OR . A paper copy in the mail If you have any lab test that is abnormal or we need to change your treatment, we will call you to review the results.  Testing/Procedures: NONE  Follow-Up: At [facility], you and your health needs are our priority.  As part of our continuing mission to provide you with exceptional heart care, we have created designated Provider Care Teams.  These Care Teams include your primary Cardiologist (physician) and Advanced Practice Providers (APPs -  Physician Assistants and Nurse Practitioners) who all work together to provide you with the care you need, when you need it. You will need a follow up appointment in:  6 months.  Please call our office 2 months in advance to schedule this appointment.  You may see Sherren Mocha, MD or one of the following Advanced Practice Providers on your designated Care Team: Richardson Dopp, PA-C [place], [place] . Daune Perch, NP

## 2019-01-14 NOTE — Progress Notes (Signed)
Virtual Visit via Video Note   This visit type was conducted due to national recommendations for restrictions regarding the COVID-19 Pandemic (e.g. social distancing) in an effort to limit this patient's exposure and mitigate transmission in our community.  Due to his co-morbid illnesses, this patient is at least at moderate risk for complications without adequate follow up.  This format is felt to be most appropriate for this patient at this time.  All issues noted in this document were discussed and addressed.  A limited physical exam was performed with this format.  Please refer to the patient's chart for his consent to telehealth for [facility].   Date:  01/14/2019   ID:  [name], DOB 10/07/1935, MRN [id number]  Patient Location: Home Provider Location: Home  PCP:  Jani Gravel, MD  Cardiologist:  Sherren Mocha, MD  Electrophysiologist:  None   Evaluation Performed:  Follow-Up Visit  Chief Complaint:  Follow up  History of Present Illness:    Joshua [name] is a 83 y.o. male with chronic diastolic CHF,  Pulmonary hypertension, COPD, VTE (formerly on NOAC, course completed), and HTN seen for follow up.   The patient was hospitalized in January 2019 with acute respiratory failure in the setting of severe COPD but also felt to have a component of diastolic heart failure.  He had a mild troponin elevation.  An echocardiogram during his hospitalization demonstrated normal LV systolic function and severe pulmonary hypertension. Follow-up echocardiogram demonstrated improved pulmonary hypertension with estimated PASP of 39 mmHg.  The study did demonstrate systolic and diastolic flattening of the septum and severe RV dysfunction.    Weight and LE edema has been stable on lasix 54m daily. Compliant with medications and low sodium diet. Does occasional self urinary catheterization. The patient denies nausea, vomiting, fever, chest pain, palpitations, shortness of breath, orthopnea,  PND, dizziness, syncope, cough, congestion, abdominal pain, hematochezia, melena, lower extremity edema.  The patient does not have symptoms concerning for COVID-19 infection (fever, chills, cough, or new shortness of breath).    Past Medical History:  Diagnosis Date  . Arthritis   . BPH (benign prostatic hyperplasia)   . COPD (chronic obstructive pulmonary disease) ([place]   . Folliculitis    posterior scalp per office visit note of Dr KMaudie Mercury12/09/2013    . GERD (gastroesophageal reflux disease)   . Hypertension   . PE (pulmonary thromboembolism) ([place]   . Phlebitis    right arm  at least 20 years ago   . Pneumonia    hx of pneumonia as a child    Past Surgical History:  Procedure Laterality Date  . bone removed from little toe right foot     . CHOLECYSTECTOMY    . pilonidal cyst removal     . TOTAL KNEE ARTHROPLASTY Left 09/13/2014   Procedure: LEFT TOTAL KNEE ARTHROPLASTY;  Surgeon: MMauri Pole MD;  Location: [facility];  Service: Orthopedics;  Laterality: Left;     Current Meds  Medication Sig  . allopurinol (ZYLOPRIM) 100 MG tablet Take 100 mg by mouth daily.  .Marland Kitchenaspirin 81 MG tablet Take 81 mg by mouth daily.  . Cinnamon 500 MG capsule Take 500 mg by mouth daily.  .Marland Kitchendoxazosin (CARDURA) 4 MG tablet Take 4 mg by mouth at bedtime.   .Marland Kitchendoxycycline (VIBRA-TABS) 100 MG tablet Take 100 mg by mouth 2 (two) times daily.  . finasteride (PROSCAR) 5 MG tablet Take 5 mg by mouth at bedtime.  .Marland Kitchen  furosemide (LASIX) 40 MG tablet Take 60 mg by mouth daily.  . pantoprazole (PROTONIX) 40 MG tablet Take 40 mg by mouth daily.  . ramipril (ALTACE) 2.5 MG capsule Take 2.5 mg by mouth daily.  Marland Kitchen SPIRIVA RESPIMAT 1.25 MCG/ACT AERS INHALE 2 PUFFS BY MOUTH INTO THE LUNGS DAILY     Allergies:   Other; Sunflower oil; and Sulfa antibiotics   Social History   Tobacco Use  . Smoking status: Former Smoker    Packs/day: 1.00    Years: 50.00    Pack years: 50.00    Types: Cigarettes    Last attempt to  quit: 08/19/2006    Years since quitting: 12.4  . Smokeless tobacco: Never Used  Substance Use Topics  . Alcohol use: Yes    Alcohol/week: 6.0 standard drinks    Types: 6 Shots of liquor per week  . Drug use: No     Family Hx: The patient's family history includes Heart attack (age of onset: 22) in his brother.  ROS:   Please see the history of present illness.    All other systems reviewed and are negative.   Prior CV studies:   The following studies were reviewed today:  Echocardiogram 01/05/2018 EF 50-55, inferolateral HK, grade 1 diastolic dysfunction, ventricular septum with diastolic flattening and systolic flattening, trivial AI, mild RV dilation, severely reduced RVSF, PASP 39  Nuclear stress test 09/24/2017 EF 54, no ischemia; Low Risk  Echocardiogram 08/25/2017 Severe focal basal septal hypertrophy, EF 55-60, normal wall motion, grade 1 diastolic dysfunction, mild to moderate AI, mildly dilated aortic root, severe RV dilation, severely reduced RVSF, moderate RAE, moderate PI, PASP 73  Labs/Other Tests and Data Reviewed:    EKG:  No ECG reviewed.  Recent Labs: 08/01/2018: BUN 17; Creatinine, Ser 1.16; Hemoglobin 16.1; Platelets 212; Potassium 3.7; Sodium 143   Recent Lipid Panel No results found for: CHOL, TRIG, HDL, CHOLHDL, LDLCALC, LDLDIRECT  Wt Readings from Last 3 Encounters:  01/14/19 244 lb (110.7 kg)  09/10/18 250 lb 12.8 oz (113.8 kg)  08/05/18 263 lb 6.4 oz (119.5 kg)     Objective:    Vital Signs:  BP 111/70   Pulse 92   Ht _0  (1.88 m)   Wt 244 lb (110.7 kg)   BMI 31.33 kg/m    VITAL SIGNS:  reviewed GEN:  no acute distress EYES:  sclerae anicteric, EOMI - Extraocular Movements Intact RESPIRATORY:  normal respiratory effort, symmetric expansion CARDIOVASCULAR:  no peripheral edema SKIN:  no rash, lesions or ulcers. MUSCULOSKELETAL:  no obvious deformities. NEURO:  alert and oriented x 3, no obvious focal deficit PSYCH:  normal affect   ASSESSMENT & PLAN:    1. Chronic diastolic CHF - Euvolemic. Continue current medications.   2. HTN - BP stable on current medications.   3. COPD - followed by pulmonary   COVID-19 Education: The signs and symptoms of COVID-19 were discussed with the patient and how to seek care for testing (follow up with PCP or arrange E-visit).  The importance of social distancing was discussed today.  Time:   Today, I have spent 8 minutes with the patient with telehealth technology discussing the above problems.     Medication Adjustments/Labs and Tests Ordered: Current medicines are reviewed at length with the patient today.  Concerns regarding medicines are outlined above.   Tests Ordered: No orders of the defined types were placed in this encounter.   Medication Changes: No orders of the defined types  were placed in this encounter.   Disposition:  Follow up in 6 month(s)  Signed, Leanor Kail, PA  01/14/2019 2:35 PM    [facility]

## 2019-02-16 DIAGNOSIS — Z20828 Contact with and (suspected) exposure to other viral communicable diseases: Secondary | ICD-10-CM | POA: Diagnosis not present

## 2019-06-07 DIAGNOSIS — I10 Essential (primary) hypertension: Secondary | ICD-10-CM | POA: Diagnosis not present

## 2019-06-07 DIAGNOSIS — Z961 Presence of intraocular lens: Secondary | ICD-10-CM | POA: Insufficient documentation

## 2019-06-07 DIAGNOSIS — R739 Hyperglycemia, unspecified: Secondary | ICD-10-CM | POA: Diagnosis not present

## 2019-06-07 DIAGNOSIS — E785 Hyperlipidemia, unspecified: Secondary | ICD-10-CM | POA: Diagnosis not present

## 2019-06-14 DIAGNOSIS — K219 Gastro-esophageal reflux disease without esophagitis: Secondary | ICD-10-CM | POA: Diagnosis not present

## 2019-06-14 DIAGNOSIS — M109 Gout, unspecified: Secondary | ICD-10-CM | POA: Diagnosis not present

## 2019-06-14 DIAGNOSIS — I10 Essential (primary) hypertension: Secondary | ICD-10-CM | POA: Diagnosis not present

## 2019-06-14 DIAGNOSIS — R739 Hyperglycemia, unspecified: Secondary | ICD-10-CM | POA: Diagnosis not present

## 2019-06-14 DIAGNOSIS — R7303 Prediabetes: Secondary | ICD-10-CM | POA: Diagnosis not present

## 2019-06-14 DIAGNOSIS — Z0000 Encounter for general adult medical examination without abnormal findings: Secondary | ICD-10-CM | POA: Diagnosis not present

## 2019-08-19 ENCOUNTER — Other Ambulatory Visit: Payer: Self-pay | Admitting: Urology

## 2019-08-23 ENCOUNTER — Other Ambulatory Visit: Payer: Self-pay | Admitting: Pulmonary Disease

## 2019-10-28 DIAGNOSIS — N401 Enlarged prostate with lower urinary tract symptoms: Secondary | ICD-10-CM | POA: Diagnosis not present

## 2019-10-28 DIAGNOSIS — R339 Retention of urine, unspecified: Secondary | ICD-10-CM | POA: Diagnosis not present

## 2019-11-12 ENCOUNTER — Telehealth: Payer: Self-pay | Admitting: Pulmonary Disease

## 2019-11-12 ENCOUNTER — Other Ambulatory Visit: Payer: Self-pay | Admitting: Pulmonary Disease

## 2019-11-12 NOTE — Telephone Encounter (Signed)
Pt needs to be scheduled for an appt prior to [place] refilling his spiriva.  Attempted to call pt but unable to reach. Left message for pt to return call.  When pt calls back, please schedule pt a f/u appt.

## 2019-11-15 NOTE — Telephone Encounter (Signed)
Pt has been scheduled for OV with Aaron Edelman on 11/17/2019 and spiriva rx has already been sent to pts pharmacy. Nothing further needed at this time. Will sign off.

## 2019-11-17 ENCOUNTER — Ambulatory Visit: Payer: Medicare Other | Admitting: Pulmonary Disease

## 2019-11-22 NOTE — Progress Notes (Signed)
Virtual Visit via Telephone Note  I connected with Joshua [name] on 11/22/19 at 10:00 AM EDT by telephone and verified that I am speaking with the correct person using two identifiers.  Location: Patient: Home Provider: Office [facility] - [address], [place], [place], [place] [address]   I discussed the limitations, risks, security and privacy concerns of performing an evaluation and management service by telephone and the availability of in person appointments. I also discussed with the patient that there may be a patient responsible charge related to this service. The patient expressed understanding and agreed to proceed.  Patient consented to consult via telephone: Yes People present and their role in pt care: Pt     History of Present Illness:  84 year old male former smoker followed in our office for COPD, pulmonary hypertension, history of PE (postop knee replacement 2016)  Past medical history: Hypertension, BPH, CHF Smoking history: Former smoker.  Quit 2008.  50-pack-year smoking history Maintenance: Patient of [name]  Chief complaint: 1 year follow / med refill   84 year old male former smoker followed in our office for COPD.  Patient was last seen in our office in 2019.  He reports that he has been doing well.  He reports no concerns with his breathing.  He has not needed any antibiotics or steroids within the last year.  He is presenting today to complete a televisit in order to obtain a refills of his Spiriva Respimat 1.25.   Patient reports that sometimes the inhaler can be costly for him.  He reports the co-pay is around $50 a month.  Observations/Objective:  Echo 12/2017 RVSP 39 NPSG 02/2018 >> no OSA, 45 mins desatn, TST 5.5 h  09/15/14 CT chest showed a large bilateral pulmonary embolism with RV dilatation. 08/2014 Venous Doppler showed no DVT   PFTs 11/2014 - FEV1 59%, no BD response, ratio 70, TLC 68%, DLCO 35%   CT angiogram 08/2017  left upper lobe nodule 9x6 mm.   CT 03/2018 which showed resolution of nodules follow-up echo 12/2017 which showed that PA pressures have decreased.   Social History   Tobacco Use  Smoking Status Former Smoker  . Packs/day: 1.00  . Years: 50.00  . Pack years: 50.00  . Types: Cigarettes  . Quit date: 08/19/2006  . Years since quitting: 13.2  Smokeless Tobacco Never Used   Immunization History  Administered Date(s) Administered  . Influenza, High Dose Seasonal PF 05/27/2017  . Influenza,inj,Quad PF,6+ Mos 07/19/2014, 05/29/2015, 05/07/2018  . Influenza-Unspecified 05/19/2016  . Pneumococcal Polysaccharide-23 12/29/2008  . Tdap 08/01/2018      Assessment and Plan:  No problem-specific Assessment & Plan notes found for this encounter.   Follow Up Instructions:  No follow-ups on file.   I discussed the assessment and treatment plan with the patient. The patient was provided an opportunity to ask questions and all were answered. The patient agreed with the plan and demonstrated an understanding of the instructions.   The patient was advised to call back or seek an in-person evaluation if the symptoms worsen or if the condition fails to improve as anticipated.  I provided 17 minutes of non-face-to-face time during this encounter.   Lauraine Rinne, NP

## 2019-11-23 ENCOUNTER — Encounter: Payer: Self-pay | Admitting: Pulmonary Disease

## 2019-11-23 ENCOUNTER — Ambulatory Visit: Payer: Medicare Other | Admitting: Pulmonary Disease

## 2019-11-23 ENCOUNTER — Other Ambulatory Visit: Payer: Self-pay

## 2019-11-23 ENCOUNTER — Ambulatory Visit (INDEPENDENT_AMBULATORY_CARE_PROVIDER_SITE_OTHER): Payer: Medicare Other | Admitting: Pulmonary Disease

## 2019-11-23 DIAGNOSIS — Z96659 Presence of unspecified artificial knee joint: Secondary | ICD-10-CM | POA: Diagnosis not present

## 2019-11-23 DIAGNOSIS — I272 Pulmonary hypertension, unspecified: Secondary | ICD-10-CM

## 2019-11-23 DIAGNOSIS — J449 Chronic obstructive pulmonary disease, unspecified: Secondary | ICD-10-CM

## 2019-11-23 DIAGNOSIS — Z86711 Personal history of pulmonary embolism: Secondary | ICD-10-CM

## 2019-11-23 DIAGNOSIS — Z87891 Personal history of nicotine dependence: Secondary | ICD-10-CM

## 2019-11-23 NOTE — Patient Instructions (Addendum)
You were seen today by Lauraine Rinne, NP  for:   1. Chronic obstructive pulmonary disease, unspecified COPD type (HCC)  Spiriva Respimat 1.25 >>> 2 puffs daily >>> Do this every day >>>This is not a rescue inhaler  Note your daily symptoms > remember "red flags" for COPD:   >>>Increase in cough >>>increase in sputum production >>>increase in shortness of breath or activity  intolerance.   If you notice these symptoms, please call the office to be seen.    Follow Up:    Return in about 1 year (around 11/22/2020), or if symptoms worsen or fail to improve, for Follow up with [name].   Please do your part to reduce the spread of COVID-19:      Reduce your risk of any infection  and COVID19 by using the similar precautions used for avoiding the common cold or flu:  Marland Kitchen Wash your hands often with soap and warm water for at least 20 seconds.  If soap and water are not readily available, use an alcohol-based hand sanitizer with at least 60% alcohol.  . If coughing or sneezing, cover your mouth and nose by coughing or sneezing into the elbow areas of your shirt or coat, into a tissue or into your sleeve (not your hands). Langley Gauss A MASK when in public  . Avoid shaking hands with others and consider head nods or verbal greetings only. . Avoid touching your eyes, nose, or mouth with unwashed hands.  . Avoid close contact with people who are sick. . Avoid places or events with large numbers of people in one location, like concerts or sporting events. . If you have some symptoms but not all symptoms, continue to monitor at home and seek medical attention if your symptoms worsen. . If you are having a medical emergency, call 911.   [place] / e-Visit: [email or web address]         [facility]: [facility]: [phone]                   [facility]  Urgent Care: [phone]     It is flu season:   >>> Best ways to protect herself from the flu: Receive the yearly flu vaccine, practice good hand hygiene washing with soap and also using hand sanitizer when available, eat a nutritious meals, get adequate rest, hydrate appropriately   Please contact the office if your symptoms worsen or you have concerns that you are not improving.   Thank you for choosing [facility] for your healthcare, and for allowing [place] to partner with you on your healthcare journey. I am thankful to be able to provide care to you today.   Wyn Quaker FNP-C

## 2019-11-23 NOTE — Assessment & Plan Note (Signed)
Plan: Continue Spiriva Respimat 1.25 We will provide samples for patient Refilled prescription Can consider [facility] referral if patient is having significant cost limitations with access to the medication

## 2019-11-25 MED ORDER — SPIRIVA RESPIMAT 1.25 MCG/ACT IN AERS: 2.0000 | INHALATION_SPRAY | Freq: Every day | RESPIRATORY_TRACT | 0 refills | Status: DC

## 2019-11-25 MED ORDER — SPIRIVA RESPIMAT 1.25 MCG/ACT IN AERS: 2.0000 | INHALATION_SPRAY | Freq: Every day | RESPIRATORY_TRACT | 6 refills | Status: DC

## 2019-11-25 NOTE — Addendum Note (Signed)
Addended by: Valerie Salts on: 11/25/2019 02:59 PM   Modules accepted: Orders

## 2019-12-08 DIAGNOSIS — Z125 Encounter for screening for malignant neoplasm of prostate: Secondary | ICD-10-CM | POA: Diagnosis not present

## 2019-12-08 DIAGNOSIS — I10 Essential (primary) hypertension: Secondary | ICD-10-CM | POA: Diagnosis not present

## 2019-12-08 DIAGNOSIS — R7303 Prediabetes: Secondary | ICD-10-CM | POA: Diagnosis not present

## 2019-12-08 DIAGNOSIS — M109 Gout, unspecified: Secondary | ICD-10-CM | POA: Diagnosis not present

## 2019-12-15 DIAGNOSIS — K219 Gastro-esophageal reflux disease without esophagitis: Secondary | ICD-10-CM | POA: Diagnosis not present

## 2019-12-15 DIAGNOSIS — E7800 Pure hypercholesterolemia, unspecified: Secondary | ICD-10-CM | POA: Diagnosis not present

## 2019-12-15 DIAGNOSIS — E119 Type 2 diabetes mellitus without complications: Secondary | ICD-10-CM | POA: Diagnosis not present

## 2019-12-15 DIAGNOSIS — R739 Hyperglycemia, unspecified: Secondary | ICD-10-CM | POA: Diagnosis not present

## 2019-12-15 DIAGNOSIS — I10 Essential (primary) hypertension: Secondary | ICD-10-CM | POA: Diagnosis not present

## 2019-12-15 DIAGNOSIS — E785 Hyperlipidemia, unspecified: Secondary | ICD-10-CM | POA: Diagnosis not present

## 2019-12-15 DIAGNOSIS — J449 Chronic obstructive pulmonary disease, unspecified: Secondary | ICD-10-CM | POA: Diagnosis not present

## 2019-12-15 DIAGNOSIS — Z0000 Encounter for general adult medical examination without abnormal findings: Secondary | ICD-10-CM | POA: Diagnosis not present

## 2020-06-07 DIAGNOSIS — E785 Hyperlipidemia, unspecified: Secondary | ICD-10-CM | POA: Diagnosis not present

## 2020-06-07 DIAGNOSIS — M109 Gout, unspecified: Secondary | ICD-10-CM | POA: Diagnosis not present

## 2020-06-07 DIAGNOSIS — E119 Type 2 diabetes mellitus without complications: Secondary | ICD-10-CM | POA: Diagnosis not present

## 2020-06-07 DIAGNOSIS — I10 Essential (primary) hypertension: Secondary | ICD-10-CM | POA: Diagnosis not present

## 2020-06-11 DIAGNOSIS — Z23 Encounter for immunization: Secondary | ICD-10-CM | POA: Diagnosis not present

## 2020-06-12 DIAGNOSIS — H524 Presbyopia: Secondary | ICD-10-CM | POA: Diagnosis not present

## 2020-06-12 DIAGNOSIS — Z961 Presence of intraocular lens: Secondary | ICD-10-CM | POA: Diagnosis not present

## 2020-06-14 DIAGNOSIS — E119 Type 2 diabetes mellitus without complications: Secondary | ICD-10-CM | POA: Diagnosis not present

## 2020-06-14 DIAGNOSIS — E785 Hyperlipidemia, unspecified: Secondary | ICD-10-CM | POA: Diagnosis not present

## 2020-06-14 DIAGNOSIS — I2699 Other pulmonary embolism without acute cor pulmonale: Secondary | ICD-10-CM | POA: Diagnosis not present

## 2020-06-14 DIAGNOSIS — J449 Chronic obstructive pulmonary disease, unspecified: Secondary | ICD-10-CM | POA: Diagnosis not present

## 2020-06-14 DIAGNOSIS — K219 Gastro-esophageal reflux disease without esophagitis: Secondary | ICD-10-CM | POA: Diagnosis not present

## 2020-06-14 DIAGNOSIS — I10 Essential (primary) hypertension: Secondary | ICD-10-CM | POA: Diagnosis not present

## 2020-06-14 DIAGNOSIS — M109 Gout, unspecified: Secondary | ICD-10-CM | POA: Diagnosis not present

## 2020-06-14 DIAGNOSIS — R609 Edema, unspecified: Secondary | ICD-10-CM | POA: Diagnosis not present

## 2020-06-29 DIAGNOSIS — R066 Hiccough: Secondary | ICD-10-CM | POA: Diagnosis not present

## 2020-08-01 ENCOUNTER — Other Ambulatory Visit: Payer: Self-pay

## 2020-08-01 ENCOUNTER — Ambulatory Visit (INDEPENDENT_AMBULATORY_CARE_PROVIDER_SITE_OTHER): Payer: Medicare Other | Admitting: Pulmonary Disease

## 2020-08-01 ENCOUNTER — Encounter: Payer: Self-pay | Admitting: Pulmonary Disease

## 2020-08-01 DIAGNOSIS — J432 Centrilobular emphysema: Secondary | ICD-10-CM

## 2020-08-01 DIAGNOSIS — I5032 Chronic diastolic (congestive) heart failure: Secondary | ICD-10-CM

## 2020-08-01 MED ORDER — BREZTRI AEROSPHERE 160-9-4.8 MCG/ACT IN AERO: 2.0000 | INHALATION_SPRAY | Freq: Two times a day (BID) | RESPIRATORY_TRACT | 0 refills | Status: DC

## 2020-08-01 NOTE — Assessment & Plan Note (Signed)
He has mild pedal edema but no signs of overt failure such as PND, orthopnea  When you are off, take an extra dose of Lasix 40 mg around 2 PM in addition to her morning dose of 60 mg x 3 to 5 days or until you lose 3 pounds

## 2020-08-01 NOTE — Patient Instructions (Signed)
Trial of Breztri 2 puffs twice daily  - sample , call me back if this works When you are off, take an extra dose of Lasix 40 mg around 2 PM in addition to her morning dose of 60 mg x 3 to 5 days or until you lose 3 pounds

## 2020-08-01 NOTE — Progress Notes (Signed)
   Subjective:    Patient ID: Joshua [name], male    DOB: April 03, 1936, 84 y.o.   MRN: [id number]  HPI  23 yoformer smokerfor FU of COPD & pulmonary hypertension He has h/oPE postop after a knee replacement in 2016. He was hospitalized 08/2017 for 3 days for acute hypoxic respiratory failure, attributed to diastolic heart failure. He was diuresed 2.5 L, had elevated troponin, follow-up stress Myoview was low risk for ischemia.  Meds- he has been tried on Brio,anoro & trelegynone of these seem to provide him any symptomatic relief -he has settled on Spiriva and seems to prefer this.  Chief Complaint  Patient presents with  . Follow-up    Shortness of breath when climbing stairs and hills   Breathing is worse especially when he is climbing hills or stairs, oxygen level stays between 93 to 94%. He reports mild leg swelling. 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 08/2014 Venous Doppler showed no DVT   PFTs 11/2014 - FEV1 59%, no BD response, ratio 70, TLC 68%, DLCO 35%   CT angiogram 08/2017 left upper lobe nodule 9x6 mm. CT 03/2018 which showed resolution of nodules   Review of Systems neg for any significant sore throat, dysphagia, itching, sneezing, nasal congestion or excess/ purulent secretions, fever, chills, sweats, unintended wt loss, pleuritic or exertional cp, hempoptysis, orthopnea pnd or change in chronic leg swelling. Also denies presyncope, palpitations, heartburn, abdominal pain, nausea, vomiting, diarrhea or change in bowel or urinary habits, dysuria,hematuria, rash, arthralgias, visual complaints, headache, numbness weakness or  ataxia.     Objective:   Physical Exam  Gen. Pleasant, elderly, well-nourished, in no distress ENT - no thrush, no pallor/icterus,no post nasal drip Neck: No JVD, no thyromegaly, no carotid bruits Lungs: no use of accessory muscles, no dullness to percussion, decreased bilateral without rales or rhonchi  Cardiovascular: Rhythm regular, heart sounds  normal, no murmurs or gallops, 1+ peripheral edema Musculoskeletal: No deformities, no cyanosis or clubbing        Assessment & Plan:

## 2020-08-01 NOTE — Assessment & Plan Note (Signed)
In the past we have tried DPI's which he has not tolerated.  He is only maintained on Spiriva but may be time to escalate Trial of Breztri 2 puffs twice daily  - sample , call me back if this works and we will send in prescription

## 2020-08-01 NOTE — Addendum Note (Signed)
Addended by: Merrilee Seashore on: 08/01/2020 03:01 PM   Modules accepted: Orders

## 2020-08-02 ENCOUNTER — Telehealth: Payer: Self-pay

## 2020-08-02 NOTE — Telephone Encounter (Signed)
Patient states he received his COVID vaccine no further information was given.

## 2020-08-17 DIAGNOSIS — R066 Hiccough: Secondary | ICD-10-CM | POA: Diagnosis not present

## 2020-08-17 DIAGNOSIS — I5032 Chronic diastolic (congestive) heart failure: Secondary | ICD-10-CM | POA: Diagnosis not present

## 2020-08-17 DIAGNOSIS — I272 Pulmonary hypertension, unspecified: Secondary | ICD-10-CM | POA: Diagnosis not present

## 2020-09-07 ENCOUNTER — Encounter: Payer: Self-pay | Admitting: Cardiovascular Disease

## 2020-09-07 ENCOUNTER — Ambulatory Visit (INDEPENDENT_AMBULATORY_CARE_PROVIDER_SITE_OTHER): Payer: Medicare Other | Admitting: Cardiovascular Disease

## 2020-09-07 ENCOUNTER — Other Ambulatory Visit: Payer: Self-pay

## 2020-09-07 VITALS — BP 102/68 | HR 80 | Ht 75.0 in | Wt 253.2 lb

## 2020-09-07 DIAGNOSIS — I10 Essential (primary) hypertension: Secondary | ICD-10-CM

## 2020-09-07 DIAGNOSIS — I272 Pulmonary hypertension, unspecified: Secondary | ICD-10-CM

## 2020-09-07 DIAGNOSIS — J449 Chronic obstructive pulmonary disease, unspecified: Secondary | ICD-10-CM | POA: Diagnosis not present

## 2020-09-07 DIAGNOSIS — I5032 Chronic diastolic (congestive) heart failure: Secondary | ICD-10-CM | POA: Diagnosis not present

## 2020-09-07 NOTE — Progress Notes (Signed)
Cardiology Office Note:    Date:  09/07/2020   ID:  Joshua [name], DOB 1935-12-04, MRN [id number]  PCP:  Jani Gravel, MD  [facility] Cardiologist:  Sherren Mocha, MD  [place] Electrophysiologist:  None   Referring MD: Jani Gravel, MD   Chief Complaint  Patient presents with  . Shortness of Breath    History of Present Illness:    Joshua [name] is a 85 y.o. male with a hx of chronic diastolic heart failure, pulmonary hypertension, COPD, and hypertension, presenting for follow-up evaluation. The patient was hospitalized in 2019 with acute respiratory failure felt to be multifactorial in the setting of COPD exacerbation, but also with a component of diastolic heart failure. The patient has been noted to have both pulmonary hypertension and RV dysfunction by echo assessment. He has been maintained on chronic loop diuretic therapy.  The patient is here with his wife today. He complains of shortness of breath with activity. He has to stop and rest with one flight of stairs. He also complains of leg swelling for the past year. Diuretics have been increased but he hasn't appreciated much improvement. He denies orthopnea or PND. He has rare chest discomfort but doesn't consider this to be a significant issue.  The patient sometimes experiences dizziness when he gets short of breath.  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  He otherwise is not doing a lot of walking.  Past Medical History:  Diagnosis Date  . Arthritis   . BPH (benign prostatic hyperplasia)   . COPD (chronic obstructive pulmonary disease) ([place])   . Folliculitis    posterior scalp per office visit note of Dr Maudie Mercury 07/20/2014    . GERD (gastroesophageal reflux disease)   . Hypertension   . PE (pulmonary thromboembolism) ([place])   . Phlebitis    right arm  at least 20 years ago   . Pneumonia    hx of pneumonia as a child     Past Surgical History:  Procedure Laterality Date  .  bone removed from little toe right foot     . CHOLECYSTECTOMY    . pilonidal cyst removal     . TOTAL KNEE ARTHROPLASTY Left 09/13/2014   Procedure: LEFT TOTAL KNEE ARTHROPLASTY;  Surgeon: Mauri Pole, MD;  Location: [facility];  Service: Orthopedics;  Laterality: Left;    Current Medications: Current Meds  Medication Sig  . allopurinol (ZYLOPRIM) 100 MG tablet Take 100 mg by mouth daily.  Marland Kitchen aspirin 81 MG tablet Take 81 mg by mouth daily.  . Baclofen 5 MG TABS Take 1 tablet by mouth daily.  . Cinnamon 500 MG capsule Take 500 mg by mouth daily.  Marland Kitchen doxazosin (CARDURA) 4 MG tablet TAKE 1 TABLET BY MOUTH EVERY DAY  . doxycycline (VIBRA-TABS) 100 MG tablet Take 1 tablet by mouth daily as needed.  . finasteride (PROSCAR) 5 MG tablet Take 5 mg by mouth at bedtime.  . furosemide (LASIX) 80 MG tablet Take 80 mg by mouth 2 (two) times daily.  . pantoprazole (PROTONIX) 40 MG tablet Take 40 mg by mouth daily.  . ramipril (ALTACE) 2.5 MG capsule Take 2.5 mg by mouth daily.  . Tiotropium Bromide Monohydrate (SPIRIVA RESPIMAT) 1.25 MCG/ACT AERS Inhale 2 puffs into the lungs daily.     Allergies:   Other, Sunflower oil, and Sulfa antibiotics   Social History   Socioeconomic History  . Marital status: Married  Spouse name: Not on file  . Number of children: Not on file  . Years of education: Not on file  . Highest education level: Not on file  Occupational History  . Not on file  Tobacco Use  . Smoking status: Former Smoker    Packs/day: 1.00    Years: 50.00    Pack years: 50.00    Types: Cigarettes    Quit date: 08/19/2006    Years since quitting: 14.0  . Smokeless tobacco: Never Used  Vaping Use  . Vaping Use: Never used  Substance and Sexual Activity  . Alcohol use: Yes    Alcohol/week: 6.0 standard drinks    Types: 6 Shots of liquor per week  . Drug use: No  . Sexual activity: Not on file  Other Topics Concern  . Not on file  Social History Narrative  . Not on file   Social  Determinants of Health   Financial Resource Strain: Not on file  Food Insecurity: Not on file  Transportation Needs: Not on file  Physical Activity: Not on file  Stress: Not on file  Social Connections: Not on file     Family History: The patient's family history includes Heart attack (age of onset: 87) in his brother.  ROS:   Please see the history of present illness.    All other systems reviewed and are negative.  EKGs/Labs/Other Studies Reviewed:    The following studies were reviewed today: Echocardiogram 01/05/2018: Study Conclusions   - Left ventricle: The cavity size was normal. Wall thickness was  normal. Systolic function was normal. The estimated ejection  fraction was in the range of 50% to 55%. There is hypokinesis of  the inferolateral myocardium. Doppler parameters are consistent  with abnormal left ventricular relaxation (grade 1 diastolic  dysfunction).  - Ventricular septum: The contour showed diastolic flattening and  systolic flattening.  - Aortic valve: There was trivial regurgitation.  - Right ventricle: The cavity size was mildly dilated. Systolic  function was severely reduced.  - Pulmonary arteries: PA peak pressure: 39 mm Hg (S).   Impressions:   - Hypokinesis of the inferolateral wall with overall preserved LV  systolic function; mild diastolic dysfunction; D shaped septum  with septal bounce; trace AI; mild RVE with severely reduced RV  function.   EKG:  EKG is ordered today.  The ekg ordered today demonstrates normal sinus rhythm 80 bpm, right bundle branch block, left posterior fascicular block, no change from prior tracings  Recent Labs: No results found for requested labs within last 8760 hours.  Recent Lipid Panel No results found for: CHOL, TRIG, HDL, CHOLHDL, VLDL, LDLCALC, LDLDIRECT   Risk Assessment/Calculations:       Physical Exam:    VS:  BP 102/68   Pulse 80   Ht 6' 3" (1.905 m)   Wt 253 lb 3.2 oz  (114.9 kg)   SpO2 (!) 89%   BMI 31.65 kg/m     Wt Readings from Last 3 Encounters:  09/07/20 253 lb 3.2 oz (114.9 kg)  08/01/20 257 lb 6.4 oz (116.8 kg)  01/14/19 244 lb (110.7 kg)     GEN:  Well nourished, well developed in no acute distress HEENT: Normal NECK: No JVD; No carotid bruits LYMPHATICS: No lymphadenopathy CARDIAC: RRR, no murmurs, rubs, gallops RESPIRATORY:  Clear to auscultation without rales, wheezing or rhonchi  ABDOMEN: Soft, non-tender, non-distended MUSCULOSKELETAL: 2+ edema right pretibial region, 1+ edema on the left; No deformity  SKIN: Warm and  dry NEUROLOGIC:  Alert and oriented x 3 PSYCHIATRIC:  Normal affect   ASSESSMENT:    1. Chronic diastolic congestive heart failure ([place])   2. Pulmonary HTN ([place])   3. Essential hypertension   4. Chronic obstructive pulmonary disease, unspecified COPD type ([place])    PLAN:    In order of problems listed above:  1. Continue current diuretic therapy.  Blood pressure is optimized.  Discussed sodium restriction today.  Recommend updated echocardiogram as he has had RV dysfunction and pulmonary hypertension in the past.  He is given information on the lounge Dr. Device and we specifically discussed recommendations for leg elevation.  He is unable to wear compression stockings. 2. Functional class III symptoms are likely a combination of COPD, diastolic heart failure with pulmonary hypertension, and obesity/deconditioning.  Update 2D echocardiogram. 3. Blood pressure optimally controlled at present. 4. Followed by pulmonary medicine, quit smoking many years ago.        Medication Adjustments/Labs and Tests Ordered: Current medicines are reviewed at length with the patient today.  Concerns regarding medicines are outlined above.  No orders of the defined types were placed in this encounter.  No orders of the defined types were placed in this encounter.   There are no Patient Instructions on file for this visit.    Signed, Sherren Mocha, MD  09/07/2020 9:16 AM    [place]

## 2020-09-07 NOTE — Patient Instructions (Signed)
Medication Instructions:  Your provider recommends that you continue on your current medications as directed. Please refer to the Current Medication list given to you today.   *If you need a refill on your cardiac medications before your next appointment, please call your pharmacy*  Testing/Procedures: Your physician has requested that you have an echocardiogram. Echocardiography is a painless test that uses sound waves to create images of your heart. It provides your doctor with information about the size and shape of your heart and how well your heart's chambers and valves are working. This procedure takes approximately one hour. There are no restrictions for this procedure.  Follow-Up: At [facility], you and your health needs are our priority.  As part of our continuing mission to provide you with exceptional heart care, we have created designated Provider Care Teams.  These Care Teams include your primary Cardiologist (physician) and Advanced Practice Providers (APPs -  Physician Assistants and Nurse Practitioners) who all work together to provide you with the care you need, when you need it. Your next appointment:   6 month(s) The format for your next appointment:   In Person Provider:   You will see one of the following Advanced Practice Providers on your designated Care Team:    Richardson Dopp, PA-C  Robbie Lis, PA-C Then, Sherren Mocha, MD will plan to see you again in 12 month(s).    For your leg edema you should do the following: 1. Leg elevation - I recommend the Lounge Dr. Leg rest.  See below for details  2. Salt restriction  -  Use potassium chloride instead of regular salt as a salt substitute. 3. Walk regularly 4. Compression hose - [facility] 5. Weight loss   Available on [email or web address] Or  Go to [email or web address]

## 2020-09-08 ENCOUNTER — Ambulatory Visit: Payer: Medicare Other | Admitting: Cardiovascular Disease

## 2020-09-20 ENCOUNTER — Other Ambulatory Visit: Payer: Self-pay | Admitting: Pulmonary Disease

## 2020-09-29 ENCOUNTER — Ambulatory Visit (HOSPITAL_COMMUNITY): Payer: Medicare Other | Attending: Cardiology

## 2020-09-29 ENCOUNTER — Other Ambulatory Visit: Payer: Self-pay

## 2020-09-29 DIAGNOSIS — I272 Pulmonary hypertension, unspecified: Secondary | ICD-10-CM | POA: Diagnosis not present

## 2020-09-29 DIAGNOSIS — I5032 Chronic diastolic (congestive) heart failure: Secondary | ICD-10-CM | POA: Insufficient documentation

## 2020-09-29 LAB — ECHOCARDIOGRAM COMPLETE
Area-P 1/2: 3.21 cm2
S' Lateral: 3.5 cm

## 2020-10-02 ENCOUNTER — Telehealth: Payer: Self-pay | Admitting: Cardiovascular Disease

## 2020-10-02 DIAGNOSIS — R600 Localized edema: Secondary | ICD-10-CM | POA: Diagnosis not present

## 2020-10-02 DIAGNOSIS — R066 Hiccough: Secondary | ICD-10-CM | POA: Diagnosis not present

## 2020-10-02 DIAGNOSIS — I5032 Chronic diastolic (congestive) heart failure: Secondary | ICD-10-CM | POA: Diagnosis not present

## 2020-10-02 NOTE — Telephone Encounter (Signed)
Spoke with Pam in triage and she states she will call back.

## 2020-10-02 NOTE — Telephone Encounter (Signed)
Called patient back about message. Patient complaining of having BLE edema. Patient has not been taking his weight daily at the same time (besides today, last weight was on Thursday), and he has not been taking lasix 80 mg BID all the time. Patient stated the increase dose of lasix does help, but he sometimes forgets the afternoon dose. Encouraged patient to take lasix 80 mg BID, elevate his legs above his heart, and weigh himself daily at the same time with the same amount of clothing on. Informed patient if his weight increases to 3 lbs in 24 hours or 5 lbs in one week give our office a call. Encouraged patient to continue to keep salt restrictions. Will send message to Dr. Burt Knack so he is aware.

## 2020-10-02 NOTE — Telephone Encounter (Signed)
Pt c/o swelling: STAT is pt has developed SOB within 24 hours  1) How much weight have you gained and in what time span? 3 pounds  If swelling, where is the swelling located? Ankles  2) Are you currently taking a fluid pill? yes  3) Are you currently SOB? Sometimes   4) Do you have a log of your daily weights (if so, list)? No  5) Have you gained 3 pounds in a day or 5 pounds in a week?  Yes   6) Have you traveled recently? No

## 2020-10-23 ENCOUNTER — Telehealth: Payer: Self-pay | Admitting: Cardiovascular Disease

## 2020-10-23 DIAGNOSIS — I5032 Chronic diastolic (congestive) heart failure: Secondary | ICD-10-CM

## 2020-10-23 DIAGNOSIS — I272 Pulmonary hypertension, unspecified: Secondary | ICD-10-CM

## 2020-10-23 MED ORDER — METOLAZONE 2.5 MG PO TABS: 2.5000 mg | ORAL_TABLET | Freq: Every day | ORAL | 11 refills | Status: DC | PRN

## 2020-10-23 MED ORDER — TORSEMIDE 20 MG PO TABS: 40.0000 mg | ORAL_TABLET | Freq: Two times a day (BID) | ORAL | 3 refills | Status: DC

## 2020-10-23 NOTE — Telephone Encounter (Signed)
Reviewed chart. He has been struggling with fluid overload. I would recommend the following: STOP Furosemid START torsemide 40 mg BID Take metolazone 2.5 mg today 30 min before pm dose of torsemide and use as needed for weight gain of 3# from baseline, but no more than twice weekly  Check labs prior to office visit with Richardson Dopp scheduled later this month. Give ER precautions if symptoms worsen. thanks

## 2020-10-23 NOTE — Telephone Encounter (Signed)
Called patient and reviewed Dr. Antionette Char advice with him. He verbalized understanding and agreement to stop furosemide and start torsemide 40 mg bid. He will take first dose this afternoon as long as the pharmacy has it ready. He will take metolazone 2.5 mg tomorrow morning and then will only take again as needed for 3 lb weight gain, not more than 2 x per week. We do not have recent bmet on patient so I scheduled him to come in on Monday 3/14 for bmet. He is agreeable to plan and is aware of follow-up appointment with Richardson Dopp later this month. I reviewed ER precautions with him and advised him also to call back with questions or concerns. He verbalized understanding and thanked me for the call.

## 2020-10-23 NOTE — Telephone Encounter (Signed)
Returned call to patient who states he has gained 6 lbs in 4 days. He is weighing himself daily but has not weighed yet this morning. States he is taking lasix 80 mg bid as directed and did note an increase in urine output last night. States dyspnea is stable - normal for him is to rest after walking about 100 feet and that has not changed Denies orthopnea, PND.  I asked him to go and weigh himself and he reports weight today is 250 lb 4 oz, yesterday weight was 252. One week ago 246 lb. He took lasix 80 mg at 0730 - has not had significant urine output yet.  I stayed on the phone with him while he walked to his scale and noted SOB with walking. He ate 1 egg, 2 1/2 strips bacon, and toast for breakfast. I asked him to avoid high sodium foods for the remainder of the day and to keep his legs elevated above his heart when sedentary. I advised that I will forward message to Dr. Burt Knack for advice and call him back later. Advised him to take 80 mg lasix (2nd daily dose) by 3:00 this afternoon and that I will call back before the end of the day. He verbalized understanding and agreement and thanked me for the call.

## 2020-10-23 NOTE — Telephone Encounter (Signed)
Pt c/o swelling: STAT is pt has developed SOB within 24 hours  1) How much weight have you gained and in what time span?  6 llbs in 4 days  2) If swelling, where is the swelling located? Ankles, feet and leg 3)  4)  5) Are you currently taking a fluid pill? yes  6) Are you currently SOB? je have OPD- and he having hiccups  Do you have a log of your daily weights (if so, list)? yes 7) Have you gained 3 pounds in a day or 5 pounds in a week?  Yes, 6 lbs in 4 days  8) Have you traveled recently? No

## 2020-10-30 ENCOUNTER — Other Ambulatory Visit: Payer: Self-pay | Admitting: Gastroenterology

## 2020-10-30 ENCOUNTER — Other Ambulatory Visit: Payer: Medicare Other

## 2020-10-30 ENCOUNTER — Other Ambulatory Visit: Payer: Self-pay

## 2020-10-30 ENCOUNTER — Ambulatory Visit
Admission: RE | Admit: 2020-10-30 | Discharge: 2020-10-30 | Disposition: A | Discharge status: Home / Self Care | Payer: Medicare Other | Source: Ambulatory Visit | Attending: Gastroenterology | Admitting: Gastroenterology

## 2020-10-30 DIAGNOSIS — R066 Hiccough: Secondary | ICD-10-CM

## 2020-10-30 DIAGNOSIS — I5032 Chronic diastolic (congestive) heart failure: Secondary | ICD-10-CM

## 2020-10-30 DIAGNOSIS — I272 Pulmonary hypertension, unspecified: Secondary | ICD-10-CM | POA: Diagnosis not present

## 2020-10-30 DIAGNOSIS — J9811 Atelectasis: Secondary | ICD-10-CM | POA: Diagnosis not present

## 2020-10-30 DIAGNOSIS — K219 Gastro-esophageal reflux disease without esophagitis: Secondary | ICD-10-CM | POA: Diagnosis not present

## 2020-10-30 DIAGNOSIS — Z86010 Personal history of colonic polyps: Secondary | ICD-10-CM | POA: Diagnosis not present

## 2020-10-30 LAB — BASIC METABOLIC PANEL
BUN/Creatinine Ratio: 20 (ref 10–24)
BUN: 32 mg/dL — ABNORMAL HIGH (ref 8–27)
CO2: 29 mmol/L (ref 20–29)
Calcium: 9.7 mg/dL (ref 8.6–10.2)
Chloride: 96 mmol/L (ref 96–106)
Creatinine, Ser: 1.59 mg/dL — ABNORMAL HIGH (ref 0.76–1.27)
Glucose: 115 mg/dL — ABNORMAL HIGH (ref 65–99)
Potassium: 3.7 mmol/L (ref 3.5–5.2)
Sodium: 139 mmol/L (ref 134–144)
eGFR: 43 mL/min/{1.73_m2} — ABNORMAL LOW (ref >59)

## 2020-10-31 ENCOUNTER — Other Ambulatory Visit: Payer: Self-pay

## 2020-10-31 ENCOUNTER — Other Ambulatory Visit (HOSPITAL_COMMUNITY)
Admission: RE | Admit: 2020-10-31 | Discharge: 2020-10-31 | Disposition: A | Discharge status: Home / Self Care | Payer: Medicare Other | Source: Ambulatory Visit | Attending: Gastroenterology | Admitting: Gastroenterology

## 2020-10-31 ENCOUNTER — Encounter (HOSPITAL_COMMUNITY): Payer: Self-pay | Admitting: Gastroenterology

## 2020-10-31 DIAGNOSIS — Z20822 Contact with and (suspected) exposure to covid-19: Secondary | ICD-10-CM | POA: Diagnosis not present

## 2020-10-31 DIAGNOSIS — Z01812 Encounter for preprocedural laboratory examination: Secondary | ICD-10-CM | POA: Insufficient documentation

## 2020-10-31 LAB — SARS CORONAVIRUS 2 (TAT 6-24 HRS): SARS Coronavirus 2: NEGATIVE

## 2020-11-02 ENCOUNTER — Telehealth: Payer: Self-pay

## 2020-11-02 DIAGNOSIS — I5032 Chronic diastolic (congestive) heart failure: Secondary | ICD-10-CM

## 2020-11-02 NOTE — Telephone Encounter (Signed)
Reviewed results with patient who verbalized understanding.   The patient is feeling better and continues to lose weight gradually (he weighs daily). He will continue current medications and recheck BMET 11/17/20. He was grateful for call and agrees with plan.

## 2020-11-02 NOTE — Telephone Encounter (Signed)
-----  Message from Sherren Mocha, MD sent at 11/02/2020 10:43 AM EDT ----- Creatinine elevated on higher dose diuretic Rx. However, he was clearly volume overloaded. If he's doing better symptomatically, would continue same Rx and repeat BMET in 2 weeks. thanks

## 2020-11-02 NOTE — Anesthesia Preprocedure Evaluation (Addendum)
Anesthesia Evaluation  Patient identified by MRN, date of birth, ID band Patient awake    Reviewed: Allergy & Precautions, NPO status , Patient's Chart, lab work & pertinent test results  Airway Mallampati: II  TM Distance: >3 FB Neck ROM: Limited    Dental  (+) Upper Dentures   Pulmonary sleep apnea , COPD, former smoker,     + decreased breath sounds      Cardiovascular hypertension, +CHF  Normal cardiovascular exam Rhythm:Regular Rate:Normal  Pulmonary HTN H/o PE  09/29/20 ECHO: 1. Left ventricular ejection fraction, by estimation, is 55 to 60%. The  left ventricle has normal function. Left ventricular endocardial border  not optimally defined to evaluate regional wall motion. There is mild  concentric left ventricular  hypertrophy. Left ventricular diastolic function could not be evaluated.  There is the interventricular septum is flattened in systole and diastole,  consistent with right ventricular pressure and volume overload.  2. Right ventricular systolic function is severely reduced. The right  ventricular size is severely enlarged. There is moderately elevated  pulmonary artery systolic pressure. The estimated right ventricular  systolic pressure is 25.6 mmHg.  3. Right atrial size was severely dilated.  4. The mitral valve is normal in structure. No evidence of mitral valve  regurgitation. No evidence of mitral stenosis.  5. Tricuspid valve regurgitation is mild to moderate.  6. The aortic valve is tricuspid. Aortic valve regurgitation is trivial.  Mild to moderate aortic valve sclerosis/calcification is present, without  any evidence of aortic stenosis.  7. Aortic dilatation noted. There is mild dilatation of the aortic root,  measuring 40 mm.  8. The inferior vena cava is normal in size with greater than 50%  respiratory variability, suggesting right atrial pressure of 3 mmHg.  9. There is left bowing of  the interatrial septum, suggestive of elevated  right atrial pressure. No atrial level shunt detected by color flow  Doppler.    Neuro/Psych negative neurological ROS  negative psych ROS   GI/Hepatic Neg liver ROS, GERD  ,  Endo/Other  negative endocrine ROS  Renal/GU negative Renal ROS  negative genitourinary   Musculoskeletal  (+) Arthritis ,   Abdominal   Peds  Hematology negative hematology ROS (+)   Anesthesia Other Findings O2 sat 90%, patient states that's his baseline.   Reproductive/Obstetrics negative OB ROS                            Anesthesia Physical Anesthesia Plan  ASA: III  Anesthesia Plan: MAC   Post-op Pain Management:    Induction: Intravenous  PONV Risk Score and Plan: TIVA and Propofol infusion  Airway Management Planned: Natural Airway and Nasal Cannula  Additional Equipment: None  Intra-op Plan:   Post-operative Plan:   Informed Consent: I have reviewed the patients History and Physical, chart, labs and discussed the procedure including the risks, benefits and alternatives for the proposed anesthesia with the patient or authorized representative who has indicated his/her understanding and acceptance.       Plan Discussed with: CRNA and Anesthesiologist  Anesthesia Plan Comments: (Did not use inhaler. Lungs clear. Will assess postop. May need breathing treatment prior to d/c. Norton Blizzard, MD  )       Anesthesia Quick Evaluation

## 2020-11-03 ENCOUNTER — Encounter (HOSPITAL_COMMUNITY)
Admission: RE | Disposition: A | Discharge status: Home / Self Care | Payer: Self-pay | Source: Home / Self Care | Attending: Gastroenterology

## 2020-11-03 ENCOUNTER — Encounter (HOSPITAL_COMMUNITY): Payer: Self-pay | Admitting: Gastroenterology

## 2020-11-03 ENCOUNTER — Other Ambulatory Visit: Payer: Self-pay

## 2020-11-03 ENCOUNTER — Ambulatory Visit (HOSPITAL_COMMUNITY): Payer: Medicare Other | Admitting: Anesthesiology

## 2020-11-03 ENCOUNTER — Ambulatory Visit (HOSPITAL_COMMUNITY)
Admission: RE | Admit: 2020-11-03 | Discharge: 2020-11-03 | Disposition: A | Discharge status: Home / Self Care | Payer: Medicare Other | Attending: Gastroenterology | Admitting: Gastroenterology

## 2020-11-03 DIAGNOSIS — Z86711 Personal history of pulmonary embolism: Secondary | ICD-10-CM | POA: Diagnosis not present

## 2020-11-03 DIAGNOSIS — J449 Chronic obstructive pulmonary disease, unspecified: Secondary | ICD-10-CM | POA: Diagnosis not present

## 2020-11-03 DIAGNOSIS — Z6829 Body mass index (BMI) 29.0-29.9, adult: Secondary | ICD-10-CM | POA: Insufficient documentation

## 2020-11-03 DIAGNOSIS — B3781 Candidal esophagitis: Secondary | ICD-10-CM | POA: Diagnosis not present

## 2020-11-03 DIAGNOSIS — K31A19 Gastric intestinal metaplasia without dysplasia, unspecified site: Secondary | ICD-10-CM | POA: Diagnosis not present

## 2020-11-03 DIAGNOSIS — Z96652 Presence of left artificial knee joint: Secondary | ICD-10-CM | POA: Diagnosis not present

## 2020-11-03 DIAGNOSIS — Z8249 Family history of ischemic heart disease and other diseases of the circulatory system: Secondary | ICD-10-CM | POA: Diagnosis not present

## 2020-11-03 DIAGNOSIS — R634 Abnormal weight loss: Secondary | ICD-10-CM | POA: Insufficient documentation

## 2020-11-03 DIAGNOSIS — R12 Heartburn: Secondary | ICD-10-CM | POA: Insufficient documentation

## 2020-11-03 DIAGNOSIS — R066 Hiccough: Secondary | ICD-10-CM | POA: Diagnosis not present

## 2020-11-03 DIAGNOSIS — K2970 Gastritis, unspecified, without bleeding: Secondary | ICD-10-CM | POA: Diagnosis not present

## 2020-11-03 DIAGNOSIS — K449 Diaphragmatic hernia without obstruction or gangrene: Secondary | ICD-10-CM | POA: Insufficient documentation

## 2020-11-03 DIAGNOSIS — Z882 Allergy status to sulfonamides status: Secondary | ICD-10-CM | POA: Insufficient documentation

## 2020-11-03 DIAGNOSIS — R1110 Vomiting, unspecified: Secondary | ICD-10-CM | POA: Diagnosis not present

## 2020-11-03 DIAGNOSIS — I5030 Unspecified diastolic (congestive) heart failure: Secondary | ICD-10-CM | POA: Diagnosis not present

## 2020-11-03 DIAGNOSIS — K3189 Other diseases of stomach and duodenum: Secondary | ICD-10-CM | POA: Diagnosis not present

## 2020-11-03 DIAGNOSIS — I110 Hypertensive heart disease with heart failure: Secondary | ICD-10-CM | POA: Diagnosis not present

## 2020-11-03 DIAGNOSIS — Z87891 Personal history of nicotine dependence: Secondary | ICD-10-CM | POA: Diagnosis not present

## 2020-11-03 DIAGNOSIS — K219 Gastro-esophageal reflux disease without esophagitis: Secondary | ICD-10-CM | POA: Diagnosis not present

## 2020-11-03 HISTORY — PX: ESOPHAGOGASTRODUODENOSCOPY (EGD) WITH PROPOFOL: SHX5813

## 2020-11-03 HISTORY — PX: BIOPSY: SHX5522

## 2020-11-03 HISTORY — DX: Sleep apnea, unspecified: G47.30

## 2020-11-03 SURGERY — ESOPHAGOGASTRODUODENOSCOPY (EGD) WITH PROPOFOL
Anesthesia: Monitor Anesthesia Care

## 2020-11-03 MED ORDER — LIDOCAINE HCL (CARDIAC) PF 100 MG/5ML IV SOSY
PREFILLED_SYRINGE | INTRAVENOUS | Status: DC | PRN
  Administered 2020-11-03: 100 mg via INTRAVENOUS

## 2020-11-03 MED ORDER — PROPOFOL 500 MG/50ML IV EMUL
INTRAVENOUS | Status: AC
  Filled 2020-11-03: qty 100

## 2020-11-03 MED ORDER — PROPOFOL 1000 MG/100ML IV EMUL
INTRAVENOUS | Status: AC
  Filled 2020-11-03: qty 100

## 2020-11-03 MED ORDER — PROPOFOL 10 MG/ML IV BOLUS
INTRAVENOUS | Status: DC | PRN
  Administered 2020-11-03: 30 mg via INTRAVENOUS

## 2020-11-03 MED ORDER — PROPOFOL 500 MG/50ML IV EMUL
INTRAVENOUS | Status: DC | PRN
  Administered 2020-11-03: 100 ug/kg/min via INTRAVENOUS

## 2020-11-03 MED ORDER — SODIUM CHLORIDE 0.9 % IV SOLN: INTRAVENOUS | Status: DC

## 2020-11-03 MED ORDER — LACTATED RINGERS IV SOLN: INTRAVENOUS | Status: DC

## 2020-11-03 SURGICAL SUPPLY — 15 items

## 2020-11-03 NOTE — Op Note (Signed)
[facility] Patient Name: Joshua [name] Procedure Date: 11/03/2020 MRN: [id number] Attending MD: Carol Ada , MD Date of Birth: [birth year] CSN: [id number] Age: 85 Admit Type: Outpatient Procedure:                Upper GI endoscopy Indications:              Heartburn Providers:                Carol Ada, MD, Cleda Daub, RN, Lesia Sago, Technician Referring MD:              Medicines:                Propofol per Anesthesia Complications:            No immediate complications. Estimated Blood Loss:     Estimated blood loss was minimal. Procedure:                Pre-Anesthesia Assessment:                           - Prior to the procedure, a History and Physical                            was performed, and patient medications and                            allergies were reviewed. The patient's tolerance of                            previous anesthesia was also reviewed. The risks                            and benefits of the procedure and the sedation                            options and risks were discussed with the patient.                            All questions were answered, and informed consent                            was obtained. Prior Anticoagulants: The patient has                            taken no previous anticoagulant or antiplatelet                            agents. ASA Grade Assessment: III - A patient with                            severe systemic disease. After reviewing the risks                            and  benefits, the patient was deemed in                            satisfactory condition to undergo the procedure.                           - Sedation was administered by an anesthesia                            professional. Deep sedation was attained.                           After obtaining informed consent, the endoscope was                            passed under direct vision. Throughout  the                            procedure, the patient's blood pressure, pulse, and                            oxygen saturations were monitored continuously. The                            [id number]-[id number] ([id number]) was introduced through the                            mouth, and advanced to the second part of duodenum.                            The upper GI endoscopy was accomplished without                            difficulty. The patient tolerated the procedure                            well. Scope In: Scope Out: Findings:      Patchy, white plaques were found in the entire esophagus. Biopsies were       taken with a cold forceps for histology.      A 2 cm hiatal hernia was present.      Localized granular mucosa was found at the pylorus. Biopsies were taken       with a cold forceps for histology.      The examined duodenum was normal. Impression:               - Esophageal plaques were found, suspicious for                            candidiasis. Biopsied.                           - 2 cm hiatal hernia.                           - Granular gastric mucosa. Biopsied.                           -  Normal examined duodenum. Moderate Sedation:      Not Applicable - Patient had care per Anesthesia. Recommendation:           - Patient has a contact number available for                            emergencies. The signs and symptoms of potential                            delayed complications were discussed with the                            patient. Return to normal activities tomorrow.                            Written discharge instructions were provided to the                            patient.                           - Resume regular diet.                           - Continue present medications.                           - Await pathology results.                           - Return to GI clinic in 4 weeks. Procedure Code(s):        --- Professional ---                            [phone], Esophagogastroduodenoscopy, flexible,                            transoral; with biopsy, single or multiple Diagnosis Code(s):        --- Professional ---                           R12, Heartburn                           K22.9, Disease of esophagus, unspecified                           K44.9, Diaphragmatic hernia without obstruction or                            gangrene                           K31.89, Other diseases of stomach and duodenum CPT copyright 2019 [employer]. All rights reserved. The codes documented in this report are preliminary and upon coder review may  be revised to meet current compliance requirements. Carol Ada, MD Carol Ada, MD 11/03/2020 8:10:13  AM This report has been signed electronically. Number of Addenda: 0

## 2020-11-03 NOTE — Transfer of Care (Signed)
Immediate Anesthesia Transfer of Care Note  Patient: Joshua [name]  Procedure(s) Performed: ESOPHAGOGASTRODUODENOSCOPY (EGD) WITH PROPOFOL (N/A ) BIOPSY  Patient Location: PACU and Endoscopy Unit  Anesthesia Type:MAC  Level of Consciousness: awake and drowsy  Airway & Oxygen Therapy: Patient Spontanous Breathing and Patient connected to face mask oxygen  Post-op Assessment: Report given to RN and Post -op Vital signs reviewed and stable  Post vital signs: Reviewed and stable  Last Vitals:  Vitals Value Taken Time  BP    Temp    Pulse    Resp    SpO2      Last Pain:  Vitals:   11/03/20 0719  TempSrc: Temporal  PainSc: 0-No pain         Complications: No complications documented.

## 2020-11-03 NOTE — Anesthesia Postprocedure Evaluation (Signed)
Anesthesia Post Note  Patient: Joshua [name]  Procedure(s) Performed: ESOPHAGOGASTRODUODENOSCOPY (EGD) WITH PROPOFOL (N/A ) BIOPSY     Patient location during evaluation: PACU Anesthesia Type: MAC Level of consciousness: awake and alert Pain management: pain level controlled Vital Signs Assessment: post-procedure vital signs reviewed and stable Respiratory status: spontaneous breathing, nonlabored ventilation and respiratory function stable Cardiovascular status: stable and blood pressure returned to baseline Postop Assessment: no apparent nausea or vomiting Anesthetic complications: no   No complications documented.  Last Vitals:  Vitals:   11/03/20 0820 11/03/20 0830  BP: 92/65 111/76  Pulse: 70 72  Resp: 15 15  Temp:    SpO2: 95% 90%    Last Pain:  Vitals:   11/03/20 0830  TempSrc:   PainSc: 0-No pain                 Merlinda Frederick

## 2020-11-03 NOTE — H&P (Signed)
  Joshua [name]   HPI: For the past 2-3 months the patient started to experience singultus. It ws intermittent at first, but then it became ore constant of late. In the mornings when he eats breakfast he is able to tolerate the PO intake, but starting last week he started to vomit the evening meals. Sometimes he feels as if the food bolus "doesn't go no further". These symptoms are not new and he reports being hospitalized in the past. He thinks one of the hospitalizations was in 1997. He does estimate a 20 lbs weight loss over the past 2-3 months and it is a combination of his vomiting and the diuretics. He feels that last week he lost 10 lbs of the 20 lbs. Baclofen was provided for him back in January and it was effective, but it caused him to be too sleepy. Pantoprazole was started many years ago. He recalls that it was started in 2008.    Past Medical History:  Diagnosis Date  . Arthritis   . BPH (benign prostatic hyperplasia)   . COPD (chronic obstructive pulmonary disease) ([place])   . Folliculitis    posterior scalp per office visit note of Dr Maudie Mercury 07/20/2014    . GERD (gastroesophageal reflux disease)   . Hypertension   . PE (pulmonary thromboembolism) ([place])   . Phlebitis    right arm  at least 20 years ago   . Pneumonia    hx of pneumonia as a child   . Sleep apnea     Past Surgical History:  Procedure Laterality Date  . bone removed from little toe right foot     . CHOLECYSTECTOMY    . pilonidal cyst removal     . TOTAL KNEE ARTHROPLASTY Left 09/13/2014   Procedure: LEFT TOTAL KNEE ARTHROPLASTY;  Surgeon: Mauri Pole, MD;  Location: [facility] ORS;  Service: Orthopedics;  Laterality: Left;    Family History  Problem Relation Age of Onset  . Heart attack Brother 39    Social History:  reports that he quit smoking about 14 years ago. His smoking use included cigarettes. He has a 50.00 pack-year smoking history. He has never used smokeless tobacco. He reports current  alcohol use of about 6.0 standard drinks of alcohol per week. He reports that he does not use drugs.  Allergies:  Allergies  Allergen Reactions  . Other     Beer- Swelling   . Sunflower Oil Swelling  . Sulfa Antibiotics Rash    Medications:  Scheduled:  Continuous: . sodium chloride      No results found for this or any previous visit (from the past 24 hour(s)).   No results found.  ROS:  As stated above in the HPI otherwise negative.  Height _0  (1.905 m), weight 106.6 kg.    PE: Gen: NAD, Alert and Oriented HEENT:  [place]/AT, EOMI Neck: Supple, no LAD Lungs: CTA Bilaterally CV: RRR without M/G/R ABD: Soft, NTND, +BS Ext: No C/C/E  Assessment/Plan: 1) Singultus - EGD.  HUNG,PATRICK D 11/03/2020, 7:18 AM

## 2020-11-03 NOTE — Discharge Instructions (Signed)

## 2020-11-06 ENCOUNTER — Encounter (HOSPITAL_COMMUNITY): Payer: Self-pay | Admitting: Gastroenterology

## 2020-11-06 LAB — SURGICAL PATHOLOGY

## 2020-11-11 ENCOUNTER — Other Ambulatory Visit: Payer: Self-pay | Admitting: Urology

## 2020-11-13 NOTE — Progress Notes (Addendum)
Cardiology Office Note:    Date:  11/14/2020   ID:  [name], DOB November 19, 1935, MRN [id number]  PCP:  Jani Gravel, [facility]  Cardiologist:  Sherren Mocha, MD   Advanced Practice Provider:  No care team member to display Electrophysiologist:  None       Referring MD: Jani Gravel, MD   Chief Complaint:  Follow-up (CHF)    Patient Profile:    Joshua [name] is a 85 y.o. male with:   COPD  (HFpEF) heart failure with preserved ejection fraction   Pulmonary hypertension   R sided CHF  Hx of pulmonary embolism   OSA   Hypertension   GERD and anyone  Prior CV studies: Echocardiogram 09/29/20 EF 55-60, mild LVH, R ventricular pressure and volume overload, severe reduced RVSF, RVSP 56.6, severe RAE, mild to mod TR, trivial AI, AV sclerosis w/o AS, aortic root 40 mm  Echocardiogram 01/05/2018 EF 50-55, inferolateral HK, grade 1 diastolic dysfunction, ventricular septum with diastolic flattening and systolic flattening, trivial AI, mild RV dilation, severely reduced RVSF, PASP 39  Nuclear stress test 09/24/2017 EF 54, no ischemia; Low Risk  Echocardiogram 08/25/2017 Severe focal basal septal hypertrophy, EF 55-60, normal wall motion, grade 1 diastolic dysfunction, mild to moderate AI, mildly dilated aortic root, severe RV dilation, severely reduced RVSF, moderate RAE, moderate PI, PASP 73    History of Present Illness:    Joshua [name] was last seen by Dr. Burt Knack in 08/2020.   A f/u echocardiogram showed normal EF but did have evidence of R sided HF.  He returns for f/u.  He is here with his wife.  Since last seen, his furosemide was changed to torsemide.  Since that time, he has lost 20 pounds.  His leg edema has decreased significantly.  He has chronic shortness of breath related to COPD.  This is unchanged.  He has not had orthopnea, paroxysmal nocturnal dyspnea.  He has not had chest discomfort or syncope.   Past Medical History:  Diagnosis  Date  . Arthritis   . BPH (benign prostatic hyperplasia)   . COPD (chronic obstructive pulmonary disease) ([place])   . Folliculitis    posterior scalp per office visit note of Dr Maudie Mercury 07/20/2014    . GERD (gastroesophageal reflux disease)   . Hypertension   . PE (pulmonary thromboembolism) ([place])   . Phlebitis    right arm  at least 20 years ago   . Pneumonia    hx of pneumonia as a child   . Sleep apnea     Current Medications: Current Meds  Medication Sig  . acetaminophen (TYLENOL) 500 MG tablet Take 1,000 mg by mouth every 6 (six) hours as needed for moderate pain or headache.  . allopurinol (ZYLOPRIM) 100 MG tablet Take 100 mg by mouth daily.  Marland Kitchen aspirin 81 MG tablet Take 81 mg by mouth daily.  . Cinnamon 500 MG capsule Take 500 mg by mouth daily.  Marland Kitchen doxazosin (CARDURA) 4 MG tablet TAKE 1 TABLET BY MOUTH EVERY DAY  . doxycycline (VIBRA-TABS) 100 MG tablet Take 100 mg by mouth 2 (two) times daily as needed (skin knot).  . finasteride (PROSCAR) 5 MG tablet Take 5 mg by mouth at bedtime.  . metolazone (ZAROXOLYN) 2.5 MG tablet Take 1 tablet (2.5 mg total) by mouth daily as needed. Take 30 minutes before torsemide for 3 lb weight gain. Do not take more than 2 x per week.  Marland Kitchen  pantoprazole (PROTONIX) 40 MG tablet Take 40 mg by mouth daily.  . ramipril (ALTACE) 2.5 MG capsule Take 2.5 mg by mouth daily.  Marland Kitchen SPIRIVA RESPIMAT 1.25 MCG/ACT AERS INHALE 2 PUFFS BY MOUTH INTO THE LUNGS DAILY  . torsemide (DEMADEX) 20 MG tablet Take 2 tablets (40 mg total) by mouth 2 (two) times daily.     Allergies:   Other, Sunflower oil, and Sulfa antibiotics   Social History   Tobacco Use  . Smoking status: Former Smoker    Packs/day: 1.00    Years: 50.00    Pack years: 50.00    Types: Cigarettes    Quit date: 08/19/2006    Years since quitting: 14.2  . Smokeless tobacco: Never Used  Vaping Use  . Vaping Use: Never used  Substance Use Topics  . Alcohol use: Yes    Alcohol/week: 6.0 standard drinks     Types: 6 Shots of liquor per week  . Drug use: No     Family Hx: The patient's family history includes Heart attack (age of onset: 12) in his brother.  ROS   EKGs/Labs/Other Test Reviewed:    EKG:  EKG is   ordered today.  The ekg ordered today demonstrates NSR, HR 77, right bundle branch block, inferolateral T wave inversions, PACs, QTC 504, similar to prior tracings  Recent Labs: 10/30/2020: BUN 32; Creatinine, Ser 1.59; Potassium 3.7; Sodium 139   Recent Lipid Panel No results found for: CHOL, TRIG, HDL, CHOLHDL, LDLCALC, LDLDIRECT    Risk Assessment/Calculations:      Physical Exam:    VS:  BP (!) 102/50   Pulse 82   Ht _0  (1.905 m)   Wt 232 lb (105.2 kg)   SpO2 90%   BMI 29.00 kg/m     Wt Readings from Last 3 Encounters:  11/14/20 232 lb (105.2 kg)  11/03/20 232 lb (105.2 kg)  09/07/20 253 lb 3.2 oz (114.9 kg)     Constitutional:      Appearance: Healthy appearance. Not in distress.  Neck:     Vascular: JVD normal.  Pulmonary:     Effort: Pulmonary effort is normal.     Breath sounds: No wheezing. No rales.  Cardiovascular:     Normal rate. Irregular rhythm. Normal S1. Normal S2.     Murmurs: There is no murmur.  Edema:    Pretibial: bilateral trace edema of the pretibial area. Abdominal:     Palpations: Abdomen is soft. There is no hepatomegaly.  Skin:    General: Skin is warm and dry.  Neurological:     General: No focal deficit present.     Mental Status: Alert and oriented to person, place and time.     Cranial Nerves: Cranial nerves are intact.       ASSESSMENT & PLAN:    1. Chronic right-sided heart failure ([place]) He has had significant improvement since switching furosemide to torsemide.  He did have increased creatinine after starting this.  He has a follow-up BMET pending today.  I will also check a magnesium level.  Continue current dose of torsemide.  Follow-up in 3 months.  2. Chronic obstructive pulmonary disease, unspecified  COPD type ([place]) 3. Pulmonary HTN ([place]) He has chronic shortness of breath related to COPD.  He is followed chronically by pulmonology.  RVSP on recent echocardiogram was 56.6 mmHg.  4. Essential hypertension The patient's blood pressure is controlled on his current regimen.  Continue current therapy.  Dispo:  Return in about 3 months (around 02/14/2021) for Routine Follow Up, w/ Dr. Burt Knack, or Richardson Dopp, PA-C, in person.   Medication Adjustments/Labs and Tests Ordered: Current medicines are reviewed at length with the patient today.  Concerns regarding medicines are outlined above.  Tests Ordered: Orders Placed This Encounter  Procedures  . Magnesium  . EKG 12-Lead   Medication Changes: No orders of the defined types were placed in this encounter.   Signed, Richardson Dopp, PA-C  11/14/2020 3:53 PM    [facility] [address], [place], [place]  [address] Phone: [phone]; Fax: [phone]

## 2020-11-14 ENCOUNTER — Encounter: Payer: Self-pay | Admitting: Physician Assistant

## 2020-11-14 ENCOUNTER — Other Ambulatory Visit: Payer: Self-pay

## 2020-11-14 ENCOUNTER — Other Ambulatory Visit: Payer: Medicare Other

## 2020-11-14 ENCOUNTER — Ambulatory Visit (INDEPENDENT_AMBULATORY_CARE_PROVIDER_SITE_OTHER): Payer: Medicare Other | Admitting: Physician Assistant

## 2020-11-14 ENCOUNTER — Telehealth: Payer: Self-pay | Admitting: *Deleted

## 2020-11-14 VITALS — BP 102/50 | HR 82 | Ht 75.0 in | Wt 232.0 lb

## 2020-11-14 DIAGNOSIS — J449 Chronic obstructive pulmonary disease, unspecified: Secondary | ICD-10-CM

## 2020-11-14 DIAGNOSIS — I5032 Chronic diastolic (congestive) heart failure: Secondary | ICD-10-CM | POA: Diagnosis not present

## 2020-11-14 DIAGNOSIS — I50812 Chronic right heart failure: Secondary | ICD-10-CM | POA: Diagnosis not present

## 2020-11-14 DIAGNOSIS — I10 Essential (primary) hypertension: Secondary | ICD-10-CM

## 2020-11-14 DIAGNOSIS — I272 Pulmonary hypertension, unspecified: Secondary | ICD-10-CM | POA: Diagnosis not present

## 2020-11-14 LAB — BASIC METABOLIC PANEL
BUN/Creatinine Ratio: 21 (ref 10–24)
BUN: 37 mg/dL — ABNORMAL HIGH (ref 8–27)
CO2: 28 mmol/L (ref 20–29)
Calcium: 9.3 mg/dL (ref 8.6–10.2)
Chloride: 96 mmol/L (ref 96–106)
Creatinine, Ser: 1.74 mg/dL — ABNORMAL HIGH (ref 0.76–1.27)
Glucose: 93 mg/dL (ref 65–99)
Potassium: 3.5 mmol/L (ref 3.5–5.2)
Sodium: 138 mmol/L (ref 134–144)
eGFR: 39 mL/min/{1.73_m2} — ABNORMAL LOW (ref >59)

## 2020-11-14 LAB — MAGNESIUM: Magnesium: 1.2 mg/dL — ABNORMAL LOW (ref 1.6–2.3)

## 2020-11-14 MED ORDER — POTASSIUM CHLORIDE ER 10 MEQ PO TBCR
10.0000 meq | EXTENDED_RELEASE_TABLET | Freq: Every day | ORAL | 3 refills | Status: DC
Start: 2020-11-14 — End: 2021-01-23

## 2020-11-14 MED ORDER — MAGNESIUM OXIDE 400 MG PO CAPS: ORAL_CAPSULE | ORAL | 1 refills | Status: DC

## 2020-11-14 MED ORDER — TORSEMIDE 20 MG PO TABS: ORAL_TABLET | ORAL | 1 refills | Status: DC

## 2020-11-14 NOTE — Telephone Encounter (Signed)
Spoke with pt and reviewed results and recommendations per Richardson Dopp, PA-C.  Pt verbalized understanding of instructions.  He will come for labs on 4/12.  Pt appreciative for call.

## 2020-11-14 NOTE — Patient Instructions (Signed)
Medication Instructions:  Your physician recommends that you continue on your current medications as directed. Please refer to the Current Medication list given to you today.  *If you need a refill on your cardiac medications before your next appointment, please call your pharmacy*   Lab Work: TODAY:  BMET & MAG  If you have labs (blood work) drawn today and your tests are completely normal, you will receive your results only by: Marland Kitchen MyChart Message (if you have MyChart) OR . A paper copy in the mail If you have any lab test that is abnormal or we need to change your treatment, we will call you to review the results.   Testing/Procedures: None ordered   Follow-Up: At [facility], you and your health needs are our priority.  As part of our continuing mission to provide you with exceptional heart care, we have created designated Provider Care Teams.  These Care Teams include your primary Cardiologist (physician) and Advanced Practice Providers (APPs -  Physician Assistants and Nurse Practitioners) who all work together to provide you with the care you need, when you need it.  We recommend signing up for the patient portal called "MyChart".  Sign up information is provided on this After Visit Summary.  MyChart is used to connect with patients for Virtual Visits (Telemedicine).  Patients are able to view lab/test results, encounter notes, upcoming appointments, etc.  Non-urgent messages can be sent to your provider as well.   To learn more about what you can do with MyChart, go to [email or web address].    Your next appointment:   3 month(s)  The format for your next appointment:   In Person  Provider:   Sherren Mocha, MD or Richardson Dopp, PA-C   Other Instructions

## 2020-11-14 NOTE — Telephone Encounter (Signed)
-----  Message from Liliane Shi, [place] sent at 11/14/2020  5:11 PM EDT ----- Magnesium low.   Reviewed BMET obtained today as well.  Creatinine increased some more.  K+ low normal.   PLAN:  -Decrease Torsemide to 40 mg in A and 20 mg in P -Start K+ 10 mEq twice daily x 3 days, then once daily thereafter -Start Magnesium Oxide 400 mg twice daily x 2 weeks, then once daily  -BMET, Magnesium 2 weeks -Weigh daily; call if wt increases > 3 lbs in 1 day Richardson Dopp, PA-C    11/14/2020 5:07 PM

## 2020-11-17 ENCOUNTER — Other Ambulatory Visit: Payer: Medicare Other

## 2020-11-24 DIAGNOSIS — Z23 Encounter for immunization: Secondary | ICD-10-CM | POA: Diagnosis not present

## 2020-11-27 DIAGNOSIS — R066 Hiccough: Secondary | ICD-10-CM | POA: Diagnosis not present

## 2020-11-28 ENCOUNTER — Other Ambulatory Visit: Payer: Medicare Other

## 2020-11-28 ENCOUNTER — Other Ambulatory Visit: Payer: Self-pay

## 2020-11-28 DIAGNOSIS — I5032 Chronic diastolic (congestive) heart failure: Secondary | ICD-10-CM

## 2020-11-28 DIAGNOSIS — I10 Essential (primary) hypertension: Secondary | ICD-10-CM

## 2020-11-28 LAB — BASIC METABOLIC PANEL
BUN/Creatinine Ratio: 17 (ref 10–24)
BUN: 31 mg/dL — ABNORMAL HIGH (ref 8–27)
CO2: 27 mmol/L (ref 20–29)
Calcium: 9.2 mg/dL (ref 8.6–10.2)
Chloride: 98 mmol/L (ref 96–106)
Creatinine, Ser: 1.8 mg/dL — ABNORMAL HIGH (ref 0.76–1.27)
Glucose: 119 mg/dL — ABNORMAL HIGH (ref 65–99)
Potassium: 3.9 mmol/L (ref 3.5–5.2)
Sodium: 140 mmol/L (ref 134–144)
eGFR: 37 mL/min/{1.73_m2} — ABNORMAL LOW (ref >59)

## 2020-11-28 LAB — MAGNESIUM: Magnesium: 1.6 mg/dL (ref 1.6–2.3)

## 2020-11-29 ENCOUNTER — Other Ambulatory Visit: Payer: Self-pay | Admitting: *Deleted

## 2020-11-29 DIAGNOSIS — I5032 Chronic diastolic (congestive) heart failure: Secondary | ICD-10-CM

## 2020-11-29 DIAGNOSIS — I10 Essential (primary) hypertension: Secondary | ICD-10-CM

## 2020-11-29 DIAGNOSIS — I50812 Chronic right heart failure: Secondary | ICD-10-CM

## 2020-11-29 MED ORDER — TORSEMIDE 20 MG PO TABS: ORAL_TABLET | ORAL | 1 refills | Status: DC

## 2020-12-02 ENCOUNTER — Emergency Department (HOSPITAL_COMMUNITY): Payer: Medicare Other

## 2020-12-02 ENCOUNTER — Emergency Department (HOSPITAL_BASED_OUTPATIENT_CLINIC_OR_DEPARTMENT_OTHER): Payer: Medicare Other

## 2020-12-02 ENCOUNTER — Observation Stay (HOSPITAL_COMMUNITY)
Admission: EM | Admit: 2020-12-02 | Discharge: 2020-12-03 | Disposition: A | Discharge status: Home / Self Care | Payer: Medicare Other | Attending: Internal Medicine | Admitting: Internal Medicine

## 2020-12-02 DIAGNOSIS — Z20822 Contact with and (suspected) exposure to covid-19: Secondary | ICD-10-CM | POA: Diagnosis not present

## 2020-12-02 DIAGNOSIS — R55 Syncope and collapse: Secondary | ICD-10-CM

## 2020-12-02 DIAGNOSIS — R609 Edema, unspecified: Secondary | ICD-10-CM | POA: Diagnosis not present

## 2020-12-02 DIAGNOSIS — Z87891 Personal history of nicotine dependence: Secondary | ICD-10-CM | POA: Diagnosis not present

## 2020-12-02 DIAGNOSIS — N289 Disorder of kidney and ureter, unspecified: Secondary | ICD-10-CM

## 2020-12-02 DIAGNOSIS — I5033 Acute on chronic diastolic (congestive) heart failure: Secondary | ICD-10-CM | POA: Diagnosis not present

## 2020-12-02 DIAGNOSIS — Z79899 Other long term (current) drug therapy: Secondary | ICD-10-CM | POA: Insufficient documentation

## 2020-12-02 DIAGNOSIS — I130 Hypertensive heart and chronic kidney disease with heart failure and stage 1 through stage 4 chronic kidney disease, or unspecified chronic kidney disease: Secondary | ICD-10-CM | POA: Insufficient documentation

## 2020-12-02 DIAGNOSIS — Z7982 Long term (current) use of aspirin: Secondary | ICD-10-CM | POA: Diagnosis not present

## 2020-12-02 DIAGNOSIS — R001 Bradycardia, unspecified: Secondary | ICD-10-CM | POA: Diagnosis not present

## 2020-12-02 DIAGNOSIS — I50812 Chronic right heart failure: Secondary | ICD-10-CM

## 2020-12-02 DIAGNOSIS — N400 Enlarged prostate without lower urinary tract symptoms: Secondary | ICD-10-CM | POA: Diagnosis not present

## 2020-12-02 DIAGNOSIS — D696 Thrombocytopenia, unspecified: Secondary | ICD-10-CM | POA: Diagnosis not present

## 2020-12-02 DIAGNOSIS — R4020 Unspecified coma: Secondary | ICD-10-CM | POA: Diagnosis not present

## 2020-12-02 DIAGNOSIS — M109 Gout, unspecified: Secondary | ICD-10-CM | POA: Insufficient documentation

## 2020-12-02 DIAGNOSIS — I2781 Cor pulmonale (chronic): Secondary | ICD-10-CM | POA: Insufficient documentation

## 2020-12-02 DIAGNOSIS — R066 Hiccough: Secondary | ICD-10-CM | POA: Insufficient documentation

## 2020-12-02 DIAGNOSIS — I509 Heart failure, unspecified: Secondary | ICD-10-CM | POA: Diagnosis not present

## 2020-12-02 DIAGNOSIS — J9811 Atelectasis: Secondary | ICD-10-CM | POA: Diagnosis not present

## 2020-12-02 DIAGNOSIS — I4519 Other right bundle-branch block: Secondary | ICD-10-CM | POA: Diagnosis not present

## 2020-12-02 DIAGNOSIS — N1832 Chronic kidney disease, stage 3b: Secondary | ICD-10-CM | POA: Diagnosis not present

## 2020-12-02 DIAGNOSIS — R0902 Hypoxemia: Secondary | ICD-10-CM | POA: Diagnosis not present

## 2020-12-02 DIAGNOSIS — J449 Chronic obstructive pulmonary disease, unspecified: Secondary | ICD-10-CM | POA: Insufficient documentation

## 2020-12-02 DIAGNOSIS — R778 Other specified abnormalities of plasma proteins: Secondary | ICD-10-CM | POA: Diagnosis not present

## 2020-12-02 DIAGNOSIS — R0602 Shortness of breath: Secondary | ICD-10-CM

## 2020-12-02 DIAGNOSIS — K449 Diaphragmatic hernia without obstruction or gangrene: Secondary | ICD-10-CM | POA: Diagnosis not present

## 2020-12-02 DIAGNOSIS — I4510 Unspecified right bundle-branch block: Secondary | ICD-10-CM | POA: Diagnosis not present

## 2020-12-02 DIAGNOSIS — I10 Essential (primary) hypertension: Secondary | ICD-10-CM | POA: Diagnosis present

## 2020-12-02 DIAGNOSIS — I517 Cardiomegaly: Secondary | ICD-10-CM | POA: Diagnosis not present

## 2020-12-02 DIAGNOSIS — I110 Hypertensive heart disease with heart failure: Secondary | ICD-10-CM | POA: Diagnosis not present

## 2020-12-02 DIAGNOSIS — R29810 Facial weakness: Secondary | ICD-10-CM | POA: Diagnosis not present

## 2020-12-02 LAB — CBC WITH DIFFERENTIAL/PLATELET
Abs Immature Granulocytes: 0.04 10*3/uL (ref 0.00–0.07)
Basophils Absolute: 0 10*3/uL (ref 0.0–0.1)
Basophils Relative: 1 %
Eosinophils Absolute: 0.1 10*3/uL (ref 0.0–0.5)
Eosinophils Relative: 3 %
HCT: 45.2 % (ref 39.0–52.0)
Hemoglobin: 14.7 g/dL (ref 13.0–17.0)
Immature Granulocytes: 1 %
Lymphocytes Relative: 12 %
Lymphs Abs: 0.6 10*3/uL — ABNORMAL LOW (ref 0.7–4.0)
MCH: 28.1 pg (ref 26.0–34.0)
MCHC: 32.5 g/dL (ref 30.0–36.0)
MCV: 86.3 fL (ref 80.0–100.0)
Monocytes Absolute: 0.5 10*3/uL (ref 0.1–1.0)
Monocytes Relative: 9 %
Neutro Abs: 4.1 10*3/uL (ref 1.7–7.7)
Neutrophils Relative %: 74 %
Platelets: 149 10*3/uL — ABNORMAL LOW (ref 150–400)
RBC: 5.24 MIL/uL (ref 4.22–5.81)
RDW: 15.1 % (ref 11.5–15.5)
WBC: 5.4 10*3/uL (ref 4.0–10.5)
nRBC: 0 % (ref 0.0–0.2)

## 2020-12-02 LAB — BRAIN NATRIURETIC PEPTIDE: B Natriuretic Peptide: 963.5 pg/mL — ABNORMAL HIGH (ref 0.0–100.0)

## 2020-12-02 LAB — COMPREHENSIVE METABOLIC PANEL
ALT: 17 U/L (ref 0–44)
AST: 25 U/L (ref 15–41)
Albumin: 2.9 g/dL — ABNORMAL LOW (ref 3.5–5.0)
Alkaline Phosphatase: 80 U/L (ref 38–126)
Anion gap: 6 (ref 5–15)
BUN: 29 mg/dL — ABNORMAL HIGH (ref 8–23)
CO2: 32 mmol/L (ref 22–32)
Calcium: 8.7 mg/dL — ABNORMAL LOW (ref 8.9–10.3)
Chloride: 97 mmol/L — ABNORMAL LOW (ref 98–111)
Creatinine, Ser: 1.59 mg/dL — ABNORMAL HIGH (ref 0.61–1.24)
GFR, Estimated: 43 mL/min — ABNORMAL LOW (ref >60)
Glucose, Bld: 127 mg/dL — ABNORMAL HIGH (ref 70–99)
Potassium: 4.4 mmol/L (ref 3.5–5.1)
Sodium: 135 mmol/L (ref 135–145)
Total Bilirubin: 1.1 mg/dL (ref 0.3–1.2)
Total Protein: 6.1 g/dL — ABNORMAL LOW (ref 6.5–8.1)

## 2020-12-02 LAB — TROPONIN I (HIGH SENSITIVITY): Troponin I (High Sensitivity): 40 ng/L — ABNORMAL HIGH (ref <18)

## 2020-12-02 LAB — D-DIMER, QUANTITATIVE: D-Dimer, Quant: 10.32 ug/mL-FEU — ABNORMAL HIGH (ref 0.00–0.50)

## 2020-12-02 LAB — RESP PANEL BY RT-PCR (FLU A&B, COVID) ARPGX2
Influenza A by PCR: NEGATIVE
Influenza B by PCR: NEGATIVE
SARS Coronavirus 2 by RT PCR: NEGATIVE

## 2020-12-02 MED ORDER — HEPARIN (PORCINE) 25000 UT/250ML-% IV SOLN
1700.0000 [IU]/h | INTRAVENOUS | Status: DC
  Administered 2020-12-02: 1700 [IU]/h via INTRAVENOUS
  Filled 2020-12-02: qty 250

## 2020-12-02 MED ORDER — HEPARIN BOLUS VIA INFUSION
6300.0000 [IU] | Freq: Once | INTRAVENOUS | Status: AC
  Administered 2020-12-02: 6300 [IU] via INTRAVENOUS
  Filled 2020-12-02: qty 6300

## 2020-12-02 NOTE — ED Provider Notes (Signed)
Joshua [name]   CSN: [id number] Arrival date & time: 12/02/20  1837     History Chief Complaint  Patient presents with  . Loss of Consciousness    Joshua [name] is a 85 y.o. male.  The history is provided by the patient, the EMS personnel and medical records.  Loss of Consciousness  Joshua [name] is a 85 y.o. male who presents to the Emergency Department complaining of syncope. He presents the emergency department by EMS for evaluation following a syncopal event. He was walking up the stairs when he began to feel dizzy. He slumped to the ground. He did not strike his head. He was unresponsive for approximately five minutes. On fire arrival he was found to be hypoxic with sats in the 70s. He was orthostatic but not hypotensive. He denies any fevers, chest pain, shortness of breath, abdominal pain, nausea, vomiting, bloody stools. He does have a history of COPD, CHF. He does have slightly increased lower extremity edema compared to baseline. He also has baseline sats of 88 to 94%. He does not have home oxygen. He does have a history of PE six years ago. He is is not currently on anticoagulation. He had an additional syncopal event a few days previously. He was not evaluated after that event. He was recently seen by cardiology and his diuretic was decreased. He does report increasing lower extremity edema but no increase in his weight.     Past Medical History:  Diagnosis Date  . Arthritis   . BPH (benign prostatic hyperplasia)   . COPD (chronic obstructive pulmonary disease) ([place])   . Folliculitis    posterior scalp per office visit [name] of Dr Maudie Mercury 07/20/2014    . GERD (gastroesophageal reflux disease)   . Hypertension   . PE (pulmonary thromboembolism) ([place])   . Phlebitis    right arm  at least 20 years ago   . Pneumonia    hx of pneumonia as a child   . Sleep apnea     Patient Active Problem List   Diagnosis Date Noted  .  Other secondary pulmonary hypertension ([place]) 09/29/2017  . BPH with urinary obstruction 08/25/2017  . Syncope, vasovagal 08/25/2017  . Demand ischemia of myocardium ([place]) 08/25/2017  . Diastolic CHF ([place]) 49/17/9150  . Essential hypertension 02/16/2015  . COPD (chronic obstructive pulmonary disease) ([place]) 12/12/2014  . History of pulmonary embolism 10/10/2014  . S/P knee replacement 09/13/2014    Past Surgical History:  Procedure Laterality Date  . BIOPSY  11/03/2020   Procedure: BIOPSY;  Surgeon: Carol Ada, MD;  Location: [facility];  Service: Endoscopy;;  . bone removed from little toe right foot     . CHOLECYSTECTOMY    . ESOPHAGOGASTRODUODENOSCOPY (EGD) WITH PROPOFOL N/A 11/03/2020   Procedure: ESOPHAGOGASTRODUODENOSCOPY (EGD) WITH PROPOFOL;  Surgeon: Carol Ada, MD;  Location: [facility];  Service: Endoscopy;  Laterality: N/A;  . pilonidal cyst removal     . TOTAL KNEE ARTHROPLASTY Left 09/13/2014   Procedure: LEFT TOTAL KNEE ARTHROPLASTY;  Surgeon: Mauri Pole, MD;  Location: [facility];  Service: Orthopedics;  Laterality: Left;       Family History  Problem Relation Age of Onset  . Heart attack Brother 58    Social History   Tobacco Use  . Smoking status: Former Smoker    Packs/day: 1.00    Years: 50.00    Pack years: 50.00    Types: Cigarettes  Quit date: 08/19/2006    Years since quitting: 14.2  . Smokeless tobacco: Never Used  Vaping Use  . Vaping Use: Never used  Substance Use Topics  . Alcohol use: Yes    Alcohol/week: 6.0 standard drinks    Types: 6 Shots of liquor per week  . Drug use: No    Home Medications Prior to Admission medications   Medication Sig Start Date End Date Taking? Authorizing Provider  acetaminophen (TYLENOL) 500 MG tablet Take 1,000 mg by mouth daily as needed for moderate pain or headache.   Yes [provider]  allopurinol (ZYLOPRIM) 100 MG tablet Take 100 mg by mouth every morning.   Yes [provider]  aspirin EC 81 MG tablet Take 81 mg by mouth every morning. Swallow whole.   Yes [provider]  Cinnamon 500 MG capsule Take 500 mg by mouth every morning.   Yes [provider]  doxazosin (CARDURA) 4 MG tablet TAKE 1 TABLET BY MOUTH EVERY DAY Patient taking differently: Take 4 mg by mouth at bedtime. 11/21/20  Yes McKenzie, Candee Furbish, MD  doxycycline (VIBRA-TABS) 100 MG tablet Take 100 mg by mouth 2 (two) times daily as needed (skin knot on back of neck). 12/14/18  Yes [provider]  finasteride (PROSCAR) 5 MG tablet Take 5 mg by mouth at bedtime.   Yes [provider]  gabapentin (NEURONTIN) 100 MG capsule Take 100 mg by mouth 3 (three) times daily. 11/27/20  Yes [provider]  ibuprofen (ADVIL) 200 MG tablet Take 400 mg by mouth daily as needed for headache (pain).   Yes [provider]  magnesium oxide (MAG-OX) 400 MG tablet Take 400 mg by mouth every morning. 11/14/20  Yes [provider]  metolazone (ZAROXOLYN) 2.5 MG tablet Take 1 tablet (2.5 mg total) by mouth daily as needed. Take 30 minutes before torsemide for 3 lb weight gain. Do not take more than 2 x per week. Patient taking differently: Take 2.5 mg by mouth daily as needed (Take 30 minutes before torsemide for 3 lb weight gain. Do not take more than 2 x per week.). 10/23/20  Yes Sherren Mocha, MD  pantoprazole (PROTONIX) 40 MG tablet Take 40 mg by mouth every morning.   Yes [provider]  potassium chloride (KLOR-CON) 10 MEQ tablet Take 1 tablet (10 mEq total) by mouth daily. Patient taking differently: Take 10 mEq by mouth every morning. 11/14/20  Yes Weaver, Scott T, PA-C  ramipril (ALTACE) 2.5 MG capsule Take 2.5 mg by mouth every morning.   Yes [provider]  SPIRIVA RESPIMAT 1.25 MCG/ACT AERS INHALE 2 PUFFS BY MOUTH INTO THE LUNGS DAILY Patient taking differently: Inhale 2 puffs into the lungs at bedtime. 09/21/20  Yes Lauraine Rinne, NP   torsemide (DEMADEX) 20 MG tablet Take one tablet by mouth ( 20 mg) twice daily. Take one extra tablet ( 20 mg ) for extra 3 lbs in one day. Patient taking differently: Take 20-40 mg by mouth See admin instructions. Take 2 tablets (40 mg) by mouth every morning, take 1 tablet (20 mg) every afternoon 11/29/20  Yes Richardson Dopp T, PA-C  Magnesium Oxide 400 MG CAPS Take one capsule by mouth twice daily for 2 weeks, then decrease to once daily thereafter. Patient not taking: Reported on 12/02/2020 11/14/20   Richardson Dopp T, PA-C    Allergies    Other, Sunflower oil, and Sulfa antibiotics  Review of Systems   Review of  Systems  Cardiovascular: Positive for syncope.  All other systems reviewed and are negative.   Physical Exam Updated Vital Signs BP 102/72   Pulse 66   Temp 99.7 F (37.6 C) (Oral)   Resp 15   Ht _0  (1.905 m)   Wt 106.6 kg   SpO2 95%   BMI 29.37 kg/m   Physical Exam Vitals and nursing [name] reviewed.  Constitutional:      Appearance: He is well-developed.  HENT:     Head: Normocephalic and atraumatic.  Cardiovascular:     Rate and Rhythm: Normal rate and regular rhythm.     Heart sounds: No murmur heard.   Pulmonary:     Effort: Pulmonary effort is normal. No respiratory distress.     Comments: Occasional fine crackles in the bases bilaterally Abdominal:     Palpations: Abdomen is soft.     Tenderness: There is no abdominal tenderness. There is no guarding or rebound.  Musculoskeletal:        General: No tenderness.     Comments: Nonpitting edema to BLE  Skin:    General: Skin is warm and dry.  Neurological:     Mental Status: He is alert and oriented to person, place, and time.  Psychiatric:        Behavior: Behavior normal.     ED Results / Procedures / Treatments   Labs (all labs ordered are listed, but only abnormal results are displayed) Labs Reviewed  CBC WITH DIFFERENTIAL/PLATELET - Abnormal; Notable for the following components:       Result Value   Platelets 149 (*)    Lymphs Abs 0.6 (*)    All other components within normal limits  D-DIMER, QUANTITATIVE - Abnormal; Notable for the following components:   D-Dimer, Quant 10.32 (*)    All other components within normal limits  RESP PANEL BY RT-PCR (FLU A&B, COVID) ARPGX2  COMPREHENSIVE METABOLIC PANEL  BRAIN NATRIURETIC PEPTIDE  HEPARIN LEVEL (UNFRACTIONATED)  CBC  I-STAT VENOUS BLOOD GAS, ED  TROPONIN I (HIGH SENSITIVITY)  TROPONIN I (HIGH SENSITIVITY)    EKG EKG Interpretation  Date/Time:  Saturday December 02 2020 18:46:18 EDT Ventricular Rate:  84 PR Interval:  159 QRS Duration: 164 QT Interval:  418 QTC Calculation: 495 R Axis:   171 Text Interpretation: Sinus rhythm Right bundle branch block Confirmed by Quintella Reichert [phone]) on 12/02/2020 6:50:34 PM   Radiology DG Chest Port 1 View  Result Date: 12/02/2020 CLINICAL DATA:  Syncope EXAM: PORTABLE CHEST 1 VIEW COMPARISON:  10/30/2020 FINDINGS: Cardiomegaly, vascular congestion. Mild hyperinflation of the lungs. No confluent opacities or effusions. No acute bony abnormality. IMPRESSION: Hyperinflation. Cardiomegaly, vascular congestion. Electronically Signed   By: Rolm Baptise M.D.   On: 12/02/2020 19:32   VAS [place] LOWER EXTREMITY VENOUS (DVT) (ONLY [facility] & [facility])  Result Date: 12/02/2020  Lower Venous DVT Study Indications: Edema, SOB, and syncope.  Risk Factors: CKD. Comparison Study: Prior negative bilateral lower extremity venous duplex was                   done 09/16/2014 Performing Technologist: Sharion Dove RVS  Examination Guidelines: A complete evaluation includes B-mode imaging, spectral Doppler, color Doppler, and power Doppler as needed of all accessible portions of each vessel. Bilateral testing is considered an integral part of a complete examination. Limited examinations for reoccurring indications may be performed as noted. The reflux portion of the exam is performed with the patient in reverse  Trendelenburg.  +---------+---------------+---------+-----------+----------+--------------+  RIGHT    CompressibilityPhasicitySpontaneityPropertiesThrombus Aging +---------+---------------+---------+-----------+----------+--------------+ CFV      Full           Yes      Yes                                 +---------+---------------+---------+-----------+----------+--------------+ SFJ      Full                                                        +---------+---------------+---------+-----------+----------+--------------+ FV Prox  Full                                                        +---------+---------------+---------+-----------+----------+--------------+ FV Mid   Full                                                        +---------+---------------+---------+-----------+----------+--------------+ FV DistalFull                                                        +---------+---------------+---------+-----------+----------+--------------+ PFV      Full                                                        +---------+---------------+---------+-----------+----------+--------------+ POP      Full           Yes      Yes                                 +---------+---------------+---------+-----------+----------+--------------+ PTV      Full                                                        +---------+---------------+---------+-----------+----------+--------------+ PERO     Full                                                        +---------+---------------+---------+-----------+----------+--------------+   +---------+---------------+---------+-----------+----------+--------------+ LEFT     CompressibilityPhasicitySpontaneityPropertiesThrombus Aging +---------+---------------+---------+-----------+----------+--------------+ CFV      Full           Yes      Yes                                  +---------+---------------+---------+-----------+----------+--------------+  SFJ      Full                                                        +---------+---------------+---------+-----------+----------+--------------+ FV Prox  Full                                                        +---------+---------------+---------+-----------+----------+--------------+ FV Mid   Full                                                        +---------+---------------+---------+-----------+----------+--------------+ FV DistalFull                                                        +---------+---------------+---------+-----------+----------+--------------+ PFV      Full                                                        +---------+---------------+---------+-----------+----------+--------------+ POP      Full           Yes      Yes                                 +---------+---------------+---------+-----------+----------+--------------+ PTV      Full                                                        +---------+---------------+---------+-----------+----------+--------------+ PERO     Full                                                        +---------+---------------+---------+-----------+----------+--------------+     Summary: BILATERAL: - No evidence of deep vein thrombosis seen in the lower extremities, bilaterally. - RIGHT: interstitial edema noted throughout  LEFT: Interstitial edema noted throughout.  *See table(s) above for measurements and observations.    Preliminary     Procedures Procedures  CRITICAL CARE Performed by: Quintella Reichert   Total critical care time: 35 minutes  Critical care time was exclusive of separately billable procedures and treating other patients.  Critical care was necessary to treat or prevent imminent or life-threatening deterioration.  Critical care was time spent personally by me on the following activities:  development of treatment plan with patient and/or surrogate  as well as nursing, discussions with consultants, evaluation of patient's response to treatment, examination of patient, obtaining history from patient or surrogate, ordering and performing treatments and interventions, ordering and review of laboratory studies, ordering and review of radiographic studies, pulse oximetry and re-evaluation of patient's condition.  Medications Ordered in ED Medications  heparin bolus via infusion 6,300 Units (has no administration in time range)  heparin ADULT infusion 100 units/mL (25000 units/232m) (has no administration in time range)    ED Course  I have reviewed the triage vital signs and the nursing notes.  Pertinent labs & imaging results that were available during my care of the patient were reviewed by me and considered in my medical decision making (see chart for details).    MDM Rules/Calculators/A&P                         patient with history of COPD, CHF, remote history of PE not on anticoagulation here for evaluation of syncopal event. He does have considerable new oxygen requirement without respiratory distress. He has been fully vaccinated and boosted for COVID-19. Chest x-ray with possible pulmonary vascular congestion. Lung exam is not consistent with decompensated COPD. Vascular ultrasound of the lower extremities is negative for DVT but given history of PE, hypoxia and syncope with elevation in his D dimer will empirically treat with anticoagulation pending further workup. Anticipate patient will require admission. Patient care transferred pending a renal function, BNP.  Final Clinical Impression(s) / ED Diagnoses Final diagnoses:  Hypoxia  Syncope and collapse    Rx / DC Orders ED Discharge Orders    None       RQuintella Reichert MD 12/02/20 2128

## 2020-12-02 NOTE — Progress Notes (Signed)
ANTICOAGULATION CONSULT NOTE - Initial Consult  Pharmacy Consult for heparin Indication: pulmonary embolus  Allergies  Allergen Reactions  . Other Swelling    Beer- Swelling   . Sunflower Oil Swelling  . Sulfa Antibiotics Rash    Patient Measurements: Height: _0  (190.5 cm) Weight: 106.6 kg (235 lb) IBW/kg (Calculated) : 84.5 Heparin Dosing Weight: 105kg Vital Signs: Temp: 99.7 F (37.6 C) (04/16 1849) Temp Source: Oral (04/16 1849) BP: 102/72 (04/16 2015) Pulse Rate: 66 (04/16 2015)  Labs: Recent Labs    12/02/20 2004  HGB 14.7  HCT 45.2  PLT 149*    Estimated Creatinine Clearance: 41 mL/min (A) (by C-G formula based on SCr of 1.8 mg/dL (H)).   Medical History: Past Medical History:  Diagnosis Date  . Arthritis   . BPH (benign prostatic hyperplasia)   . COPD (chronic obstructive pulmonary disease) ([place])   . Folliculitis    posterior scalp per office visit note of Dr Maudie Mercury 07/20/2014    . GERD (gastroesophageal reflux disease)   . Hypertension   . PE (pulmonary thromboembolism) ([place])   . Phlebitis    right arm  at least 20 years ago   . Pneumonia    hx of pneumonia as a child   . Sleep apnea    Assessment: 78 YOM presenting with syncope and hypoxia.  Hx PE not on anticoagulation PTA, heparin gtt for PE r/o.  CBC wnl  Goal of Therapy:  Heparin level 0.3-0.7 units/ml Monitor platelets by anticoagulation protocol: Yes   Plan:  Heparin 6300 units IV x 1, and gtt at 1700 units/hr F/u 8 hour heparin level with AM labs F/u PE workup and [facility] plan  Bertis Ruddy, PharmD Clinical Pharmacist ED Pharmacist Phone # [phone] 12/02/2020 9:09 PM

## 2020-12-02 NOTE — ED Triage Notes (Signed)
Witnessed syncope by wife and was still unconscious 5 min when fire arrived. BIB EMS alert and oriented. This is second syncopal event this week. Hx of COPD and CHF with sats 88-94 baseline and no home O2.

## 2020-12-02 NOTE — Progress Notes (Signed)
VASCULAR LAB    Bilateral lower extremity venous duplex has been performed.  See CV proc for preliminary results.  Gave verbal report to Dr. Roni Bread, [facility], RVT 12/02/2020, 7:32 PM

## 2020-12-03 ENCOUNTER — Other Ambulatory Visit: Payer: Self-pay

## 2020-12-03 ENCOUNTER — Encounter (HOSPITAL_COMMUNITY): Payer: Self-pay | Admitting: Internal Medicine

## 2020-12-03 DIAGNOSIS — R55 Syncope and collapse: Secondary | ICD-10-CM

## 2020-12-03 DIAGNOSIS — I5033 Acute on chronic diastolic (congestive) heart failure: Secondary | ICD-10-CM | POA: Diagnosis not present

## 2020-12-03 DIAGNOSIS — K449 Diaphragmatic hernia without obstruction or gangrene: Secondary | ICD-10-CM | POA: Diagnosis not present

## 2020-12-03 DIAGNOSIS — I2781 Cor pulmonale (chronic): Secondary | ICD-10-CM

## 2020-12-03 DIAGNOSIS — J9811 Atelectasis: Secondary | ICD-10-CM | POA: Diagnosis not present

## 2020-12-03 DIAGNOSIS — R778 Other specified abnormalities of plasma proteins: Secondary | ICD-10-CM

## 2020-12-03 DIAGNOSIS — I517 Cardiomegaly: Secondary | ICD-10-CM | POA: Diagnosis not present

## 2020-12-03 DIAGNOSIS — I50812 Chronic right heart failure: Secondary | ICD-10-CM

## 2020-12-03 DIAGNOSIS — I509 Heart failure, unspecified: Secondary | ICD-10-CM

## 2020-12-03 LAB — COMPREHENSIVE METABOLIC PANEL
ALT: 16 U/L (ref 0–44)
AST: 18 U/L (ref 15–41)
Albumin: 3 g/dL — ABNORMAL LOW (ref 3.5–5.0)
Alkaline Phosphatase: 83 U/L (ref 38–126)
Anion gap: 7 (ref 5–15)
BUN: 26 mg/dL — ABNORMAL HIGH (ref 8–23)
CO2: 32 mmol/L (ref 22–32)
Calcium: 8.8 mg/dL — ABNORMAL LOW (ref 8.9–10.3)
Chloride: 100 mmol/L (ref 98–111)
Creatinine, Ser: 1.35 mg/dL — ABNORMAL HIGH (ref 0.61–1.24)
GFR, Estimated: 52 mL/min — ABNORMAL LOW (ref >60)
Glucose, Bld: 98 mg/dL (ref 70–99)
Potassium: 3.8 mmol/L (ref 3.5–5.1)
Sodium: 139 mmol/L (ref 135–145)
Total Bilirubin: 0.6 mg/dL (ref 0.3–1.2)
Total Protein: 6.3 g/dL — ABNORMAL LOW (ref 6.5–8.1)

## 2020-12-03 LAB — CBC WITH DIFFERENTIAL/PLATELET
Abs Immature Granulocytes: 0.02 10*3/uL (ref 0.00–0.07)
Basophils Absolute: 0 10*3/uL (ref 0.0–0.1)
Basophils Relative: 0 %
Eosinophils Absolute: 0.2 10*3/uL (ref 0.0–0.5)
Eosinophils Relative: 4 %
HCT: 44.8 % (ref 39.0–52.0)
Hemoglobin: 14.8 g/dL (ref 13.0–17.0)
Immature Granulocytes: 0 %
Lymphocytes Relative: 13 %
Lymphs Abs: 0.7 10*3/uL (ref 0.7–4.0)
MCH: 28.5 pg (ref 26.0–34.0)
MCHC: 33 g/dL (ref 30.0–36.0)
MCV: 86.2 fL (ref 80.0–100.0)
Monocytes Absolute: 0.6 10*3/uL (ref 0.1–1.0)
Monocytes Relative: 11 %
Neutro Abs: 3.7 10*3/uL (ref 1.7–7.7)
Neutrophils Relative %: 72 %
Platelets: 148 10*3/uL — ABNORMAL LOW (ref 150–400)
RBC: 5.2 MIL/uL (ref 4.22–5.81)
RDW: 15 % (ref 11.5–15.5)
WBC: 5.2 10*3/uL (ref 4.0–10.5)
nRBC: 0.4 % — ABNORMAL HIGH (ref 0.0–0.2)

## 2020-12-03 LAB — CBC
HCT: 46.1 % (ref 39.0–52.0)
Hemoglobin: 15.1 g/dL (ref 13.0–17.0)
MCH: 28.4 pg (ref 26.0–34.0)
MCHC: 32.8 g/dL (ref 30.0–36.0)
MCV: 86.8 fL (ref 80.0–100.0)
Platelets: 143 10*3/uL — ABNORMAL LOW (ref 150–400)
RBC: 5.31 MIL/uL (ref 4.22–5.81)
RDW: 15.5 % (ref 11.5–15.5)
WBC: 6 10*3/uL (ref 4.0–10.5)
nRBC: 0 % (ref 0.0–0.2)

## 2020-12-03 LAB — HEPARIN LEVEL (UNFRACTIONATED): Heparin Unfractionated: 0.79 IU/mL — ABNORMAL HIGH (ref 0.30–0.70)

## 2020-12-03 LAB — TROPONIN I (HIGH SENSITIVITY)
Troponin I (High Sensitivity): 73 ng/L — ABNORMAL HIGH (ref <18)
Troponin I (High Sensitivity): 80 ng/L — ABNORMAL HIGH (ref <18)
Troponin I (High Sensitivity): 66 ng/L — ABNORMAL HIGH (ref <18)

## 2020-12-03 LAB — MAGNESIUM: Magnesium: 1.7 mg/dL (ref 1.7–2.4)

## 2020-12-03 MED ORDER — FUROSEMIDE 10 MG/ML IJ SOLN
40.0000 mg | Freq: Two times a day (BID) | INTRAMUSCULAR | Status: DC
  Administered 2020-12-03: 40 mg via INTRAVENOUS
  Filled 2020-12-03: qty 4

## 2020-12-03 MED ORDER — ACETAMINOPHEN 650 MG RE SUPP: 650.0000 mg | Freq: Four times a day (QID) | RECTAL | Status: DC | PRN

## 2020-12-03 MED ORDER — FUROSEMIDE 10 MG/ML IJ SOLN
40.0000 mg | Freq: Once | INTRAMUSCULAR | Status: AC
  Administered 2020-12-03: 40 mg via INTRAVENOUS
  Filled 2020-12-03: qty 4

## 2020-12-03 MED ORDER — UMECLIDINIUM BROMIDE 62.5 MCG/INH IN AEPB
1.0000 | INHALATION_SPRAY | Freq: Every day | RESPIRATORY_TRACT | Status: DC
  Administered 2020-12-03: 1 via RESPIRATORY_TRACT
  Filled 2020-12-03: qty 7

## 2020-12-03 MED ORDER — FINASTERIDE 5 MG PO TABS: 5.0000 mg | ORAL_TABLET | Freq: Every day | ORAL | Status: DC

## 2020-12-03 MED ORDER — ASPIRIN 81 MG PO CHEW
324.0000 mg | CHEWABLE_TABLET | Freq: Once | ORAL | Status: AC
  Administered 2020-12-03: 324 mg via ORAL
  Filled 2020-12-03: qty 4

## 2020-12-03 MED ORDER — RAMIPRIL 2.5 MG PO CAPS
2.5000 mg | ORAL_CAPSULE | Freq: Every morning | ORAL | Status: DC
  Administered 2020-12-03: 2.5 mg via ORAL
  Filled 2020-12-03: qty 1

## 2020-12-03 MED ORDER — HEPARIN SODIUM (PORCINE) 5000 UNIT/ML IJ SOLN
5000.0000 [IU] | Freq: Three times a day (TID) | INTRAMUSCULAR | Status: DC
  Administered 2020-12-03: 5000 [IU] via SUBCUTANEOUS
  Filled 2020-12-03: qty 1

## 2020-12-03 MED ORDER — ALLOPURINOL 100 MG PO TABS
100.0000 mg | ORAL_TABLET | Freq: Every morning | ORAL | Status: DC
  Administered 2020-12-03: 100 mg via ORAL
  Filled 2020-12-03: qty 1

## 2020-12-03 MED ORDER — DOXAZOSIN MESYLATE 8 MG PO TABS: 4.0000 mg | ORAL_TABLET | Freq: Every day | ORAL | Status: DC

## 2020-12-03 MED ORDER — GABAPENTIN 100 MG PO CAPS
100.0000 mg | ORAL_CAPSULE | Freq: Three times a day (TID) | ORAL | Status: DC
  Administered 2020-12-03: 100 mg via ORAL
  Filled 2020-12-03: qty 1

## 2020-12-03 MED ORDER — PANTOPRAZOLE SODIUM 40 MG PO TBEC
40.0000 mg | DELAYED_RELEASE_TABLET | Freq: Every morning | ORAL | Status: DC
  Administered 2020-12-03: 40 mg via ORAL
  Filled 2020-12-03: qty 1

## 2020-12-03 MED ORDER — ASPIRIN EC 81 MG PO TBEC
81.0000 mg | DELAYED_RELEASE_TABLET | Freq: Every morning | ORAL | Status: DC
  Administered 2020-12-03: 81 mg via ORAL
  Filled 2020-12-03: qty 1

## 2020-12-03 MED ORDER — IOHEXOL 350 MG/ML SOLN
60.0000 mL | Freq: Once | INTRAVENOUS | Status: AC | PRN
  Administered 2020-12-03: 60 mL via INTRAVENOUS

## 2020-12-03 MED ORDER — POTASSIUM CHLORIDE CRYS ER 10 MEQ PO TBCR
10.0000 meq | EXTENDED_RELEASE_TABLET | Freq: Every morning | ORAL | Status: DC
  Administered 2020-12-03: 10 meq via ORAL
  Filled 2020-12-03: qty 1

## 2020-12-03 MED ORDER — ACETAMINOPHEN 325 MG PO TABS: 650.0000 mg | ORAL_TABLET | Freq: Four times a day (QID) | ORAL | Status: DC | PRN

## 2020-12-03 MED ORDER — MAGNESIUM OXIDE 400 (241.3 MG) MG PO TABS
400.0000 mg | ORAL_TABLET | Freq: Every morning | ORAL | Status: DC
  Administered 2020-12-03: 400 mg via ORAL
  Filled 2020-12-03: qty 1

## 2020-12-03 MED ORDER — TIOTROPIUM BROMIDE MONOHYDRATE 1.25 MCG/ACT IN AERS: 2.0000 | INHALATION_SPRAY | Freq: Every day | RESPIRATORY_TRACT | Status: DC

## 2020-12-03 NOTE — ED Notes (Signed)
Attempted report x1.

## 2020-12-03 NOTE — Consult Note (Signed)
Cardiology Consultation:   Patient ID: Joshua [name] MRN: [id number]; DOB: [birth year]  Admit date: 12/02/2020 Date of Consult: 12/03/2020  PCP:  Jani Gravel, [facility]  Cardiologist:  Sherren Mocha, MD  Advanced Practice Provider:  No care team member to display Electrophysiologist:  None        Patient Profile:   Joshua [name] is a 85 y.o. 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 Hal Hope.  History of Present Illness:   [name] felt like he was about to black out.  Patient notes that he is feeling fine now.  Notes that he was walking up the stairs and have a feeling of light headedness.  This has happened before back in 2018.  No syncope with walking, but felt dizzy.  Attempt to walk quickly so he could get to sit down.  Things went dark and he then passed out.  Describes a prior event as near syncope when going up the stairs. Has had no chest pain, chest pressure, chest tightness, chest stinging.  Feeling of dizziness does not happen during activity, but right after activity, usually going up the stairs..  No shortness of breath, DOE.  No PND or orthopnea.  No bendopnea, weight gain (notes possible 1-2 lbs at most), leg swelling , or abdominal swelling.  Notes  no palpitations or funny heart   In ED 40 Iasix given .  CCTA and LE Duplex negative for PE/DVT.  Past Medical History:  Diagnosis Date  . Arthritis   . BPH (benign prostatic hyperplasia)   . COPD (chronic obstructive pulmonary disease) ([place])   . Folliculitis    posterior scalp per office visit note of Dr Maudie Mercury 07/20/2014    . GERD (gastroesophageal reflux disease)   . Hypertension   . PE (pulmonary thromboembolism) ([place])   . Phlebitis    right arm  at least 20 years ago   . Pneumonia    hx of pneumonia as a child   . Sleep apnea     Past Surgical  History:  Procedure Laterality Date  . BIOPSY  11/03/2020   Procedure: BIOPSY;  Surgeon: Carol Ada, MD;  Location: [facility];  Service: Endoscopy;;  . bone removed from little toe right foot     . CHOLECYSTECTOMY    . ESOPHAGOGASTRODUODENOSCOPY (EGD) WITH PROPOFOL N/A 11/03/2020   Procedure: ESOPHAGOGASTRODUODENOSCOPY (EGD) WITH PROPOFOL;  Surgeon: Carol Ada, MD;  Location: [facility];  Service: Endoscopy;  Laterality: N/A;  . pilonidal cyst removal     . TOTAL KNEE ARTHROPLASTY Left 09/13/2014   Procedure: LEFT TOTAL KNEE ARTHROPLASTY;  Surgeon: Mauri Pole, MD;  Location: [facility];  Service: Orthopedics;  Laterality: Left;       Inpatient Medications: Scheduled Meds: . allopurinol  100 mg Oral q morning  . aspirin EC  81 mg Oral q morning  . doxazosin  4 mg Oral QHS  . finasteride  5 mg Oral QHS  . furosemide  40 mg Intravenous BID  . gabapentin  100 mg Oral TID  . heparin injection (subcutaneous)  5,000 Units Subcutaneous Q8H  . magnesium oxide  400 mg Oral q morning  . pantoprazole  40 mg Oral q morning  . potassium chloride  10 mEq Oral q morning  . ramipril  2.5 mg Oral q  morning  . umeclidinium bromide  1 puff Inhalation Daily   Continuous Infusions:  PRN Meds: acetaminophen **OR** acetaminophen  Allergies:    Allergies  Allergen Reactions  . Other Swelling    Beer- Swelling   . Sunflower Oil Swelling  . Sulfa Antibiotics Rash    Social History:   Social History   Socioeconomic History  . Marital status: Married    Spouse name: Not on file  . Number of children: Not on file  . Years of education: Not on file  . Highest education level: Not on file  Occupational History  . Not on file  Tobacco Use  . Smoking status: Former Smoker    Packs/day: 1.00    Years: 50.00    Pack years: 50.00    Types: Cigarettes    Quit date: 08/19/2006    Years since quitting: 14.3  . Smokeless tobacco: Never Used  Vaping Use  . Vaping Use: Never used   Substance and Sexual Activity  . Alcohol use: Yes    Alcohol/week: 6.0 standard drinks    Types: 6 Shots of liquor per week  . Drug use: No  . Sexual activity: Not on file  Other Topics Concern  . Not on file  Social History Narrative  . Not on file   Social Determinants of Health   Financial Resource Strain: Not on file  Food Insecurity: Not on file  Transportation Needs: Not on file  Physical Activity: Not on file  Stress: Not on file  Social Connections: Not on file  Intimate Partner Violence: Not on file    Family History:   No history of pulmonary hypertension in family.  Family History  Problem Relation Age of Onset  . Heart attack Brother 41     ROS:  Please see the history of present illness.   All other ROS reviewed and negative.     Physical Exam/Data:   Vitals:   12/03/20 0116 12/03/20 0348 12/03/20 0500 12/03/20 0528  BP: 119/83 112/80  128/83  Pulse: 70 65  72  Resp: _0 Temp: 98 F (36.7 C)   97.8 F (36.6 C)  TempSrc: Oral Oral  Oral  SpO2: 98% 98%  96%  Weight:   105.6 kg   Height:        Intake/Output Summary (Last 24 hours) at 12/03/2020 0801 Last data filed at 12/03/2020 0535 Gross per 24 hour  Intake 250 ml  Output 1200 ml  Net -950 ml   Last 3 Weights 12/03/2020 12/02/2020 11/14/2020  Weight (lbs) 232 lb 14.4 oz 235 lb 232 lb  Weight (kg) 105.643 kg 106.595 kg 105.235 kg     Body mass index is 29.11 kg/m.  General:  Well nourished, well developed, in no acute distress HEENT: normal Lymph: no adenopathy Neck: no JVD Endocrine:  No thryomegaly Vascular: No carotid bruits; FA pulses 2+ bilaterally without bruits  Cardiac:  normal S1, Loud P2; RRR; II/VI holosystolic murmur  No RV heave Lungs:  clear to auscultation bilaterally, no wheezing, rhonchi or rales  Abd: soft, nontender, no hepatomegaly  Ext: non-pitting edema Musculoskeletal:  No deformities, BUE and BLE strength normal and equal Skin: warm and dry  Neuro:  CNs  2-12 intact, no focal abnormalities noted Psych:  Normal affect   EKG:  The EKG was personally reviewed and demonstrates:  SR rate 69 Rare PAC, RBBB (similar to prior) JTc ~ 410 Telemetry:  Telemetry was personally reviewed and demonstrates:  SR with occasional PACs  Relevant CV Studies:  Transthoracic Echocardiogram: Date: Results: Aortic Root ULN for age and BSA- 38 mm, RV severely dilated, with decrease function 1. Left ventricular ejection fraction, by estimation, is 55 to 60%. The  left ventricle has normal function. Left ventricular endocardial border  not optimally defined to evaluate regional wall motion. There is mild  concentric left ventricular  hypertrophy. Left ventricular diastolic function could not be evaluated.  There is the interventricular septum is flattened in systole and diastole,  consistent with right ventricular pressure and volume overload.  2. Right ventricular systolic function is severely reduced. The right  ventricular size is severely enlarged. There is moderately elevated  pulmonary artery systolic pressure. The estimated right ventricular  systolic pressure is 38.1 mmHg.  3. Right atrial size was severely dilated.  4. The mitral valve is normal in structure. No evidence of mitral valve  regurgitation. No evidence of mitral stenosis.  5. Tricuspid valve regurgitation is mild to moderate.  6. The aortic valve is tricuspid. Aortic valve regurgitation is trivial.  Mild to moderate aortic valve sclerosis/calcification is present, without  any evidence of aortic stenosis.  7. Aortic dilatation noted. There is mild dilatation of the aortic root,  measuring 40 mm.  8. The inferior vena cava is normal in size with greater than 50%  respiratory variability, suggesting right atrial pressure of 3 mmHg.  9. There is left bowing of the interatrial septum, suggestive of elevated  right atrial pressure. No atrial level shunt detected by color flow   Doppler.   NonCardiac CT : Date: 11/28/20 Results: Aortic Atherosclerosis Main PA dilation 37 mm LAD and RCA calcifications: R dom vs Co dom RV Dilation RA Dilation  CS Dilation 12 mm   NM Stress Testing : Date: 09/24/2017 Results:no perfusion defect  Nuclear stress EF: 54% with overall normal but asynchronous contraction, interventricular conduction delay (right bundle branch block at baseline).  There was no ST segment deviation noted during stress.  This is a low risk study. No ischemia identified.    Laboratory Data:  High Sensitivity Troponin:   Recent Labs  Lab 12/02/20 2004 12/03/20 0115 12/03/20 0456 12/03/20 0638  TROPONINIHS 40* 73* 80* 66*     Chemistry Recent Labs  Lab 11/28/20 1119 12/02/20 2004 12/03/20 0456  NA 140 135 139  K 3.9 4.4 3.8  CL 98 97* 100  CO2 27 32 32  GLUCOSE 119* 127* 98  BUN 31* 29* 26*  CREATININE 1.80* 1.59* 1.35*  CALCIUM 9.2 8.7* 8.8*  GFRNONAA  --  43* 52*  ANIONGAP  --  6 7    Recent Labs  Lab 12/02/20 2004 12/03/20 0456  PROT 6.1* 6.3*  ALBUMIN 2.9* 3.0*  AST 25 18  ALT 17 16  ALKPHOS 80 83  BILITOT 1.1 0.6   Hematology Recent Labs  Lab 12/02/20 2004 12/03/20 0330 12/03/20 0638  WBC 5.4 6.0 5.2  RBC 5.24 5.31 5.20  HGB 14.7 15.1 14.8  HCT 45.2 46.1 44.8  MCV 86.3 86.8 86.2  MCH 28.1 28.4 28.5  MCHC 32.5 32.8 33.0  RDW 15.1 15.5 15.0  PLT 149* 143* 148*   BNP Recent Labs  Lab 12/02/20 2004  BNP 963.5*    DDimer  Recent Labs  Lab 12/02/20 2004  DDIMER 10.32*     Radiology/Studies:  CT Angio Chest PE W and/or Wo Contrast  Result Date: 12/03/2020 CLINICAL DATA:  Syncope, elevated D-dimer.  Low O2 sats. EXAM: CT ANGIOGRAPHY  CHEST WITH CONTRAST TECHNIQUE: Multidetector CT imaging of the chest was performed using the standard protocol during bolus administration of intravenous contrast. Multiplanar CT image reconstructions and MIPs were obtained to evaluate the vascular anatomy. CONTRAST:   46m OMNIPAQUE IOHEXOL 350 MG/ML SOLN COMPARISON:  08/01/2018 FINDINGS: Cardiovascular: No filling defects in the pulmonary arteries to suggest pulmonary emboli. Cardiomegaly. Diffuse coronary artery calcifications. Scattered aortic calcifications. No aneurysm. Mediastinum/Nodes: No mediastinal, hilar, or axillary adenopathy. Trachea and esophagus are unremarkable. Thyroid unremarkable. Lungs/Pleura: Mild centrilobular emphysema. No confluent opacities or effusions. Minimal bibasilar atelectasis. Upper Abdomen: Imaging into the upper abdomen demonstrates no acute findings. Small hiatal hernia. Musculoskeletal: Chest wall soft tissues are unremarkable. No acute bony abnormality. Review of the MIP images confirms the above findings. IMPRESSION: No evidence of pulmonary embolus. Cardiomegaly, diffuse coronary artery disease. Small hiatal hernia Aortic Atherosclerosis (ICD10-I70.0) and Emphysema (ICD10-J43.9). Electronically Signed   By: KRolm BaptiseM.D.   On: 12/03/2020 00:17   DG Chest Port 1 View  Result Date: 12/02/2020 CLINICAL DATA:  Syncope EXAM: PORTABLE CHEST 1 VIEW COMPARISON:  10/30/2020 FINDINGS: Cardiomegaly, vascular congestion. Mild hyperinflation of the lungs. No confluent opacities or effusions. No acute bony abnormality. IMPRESSION: Hyperinflation. Cardiomegaly, vascular congestion. Electronically Signed   By: KRolm BaptiseM.D.   On: 12/02/2020 19:32   VAS [place]OWER EXTREMITY VENOUS (DVT) (ONLY [facility] & [facility])  Result Date: 12/02/2020  Lower Venous DVT Study Indications: Edema, SOB, and syncope.  Risk Factors: CKD. Comparison Study: Prior negative bilateral lower extremity venous duplex was                   done 09/16/2014 Performing Technologist: CSharion DoveRVS  Examination Guidelines: A complete evaluation includes B-mode imaging, spectral Doppler, color Doppler, and power Doppler as needed of all accessible portions of each vessel. Bilateral testing is considered an integral part of a complete  examination. Limited examinations for reoccurring indications may be performed as noted. The reflux portion of the exam is performed with the patient in reverse Trendelenburg.  +---------+---------------+---------+-----------+----------+--------------+ RIGHT    CompressibilityPhasicitySpontaneityPropertiesThrombus Aging +---------+---------------+---------+-----------+----------+--------------+ CFV      Full           Yes      Yes                                 +---------+---------------+---------+-----------+----------+--------------+ SFJ      Full                                                        +---------+---------------+---------+-----------+----------+--------------+ FV Prox  Full                                                        +---------+---------------+---------+-----------+----------+--------------+ FV Mid   Full                                                        +---------+---------------+---------+-----------+----------+--------------+  FV DistalFull                                                        +---------+---------------+---------+-----------+----------+--------------+ PFV      Full                                                        +---------+---------------+---------+-----------+----------+--------------+ POP      Full           Yes      Yes                                 +---------+---------------+---------+-----------+----------+--------------+ PTV      Full                                                        +---------+---------------+---------+-----------+----------+--------------+ PERO     Full                                                        +---------+---------------+---------+-----------+----------+--------------+   +---------+---------------+---------+-----------+----------+--------------+ LEFT     CompressibilityPhasicitySpontaneityPropertiesThrombus Aging  +---------+---------------+---------+-----------+----------+--------------+ CFV      Full           Yes      Yes                                 +---------+---------------+---------+-----------+----------+--------------+ SFJ      Full                                                        +---------+---------------+---------+-----------+----------+--------------+ FV Prox  Full                                                        +---------+---------------+---------+-----------+----------+--------------+ FV Mid   Full                                                        +---------+---------------+---------+-----------+----------+--------------+ FV DistalFull                                                        +---------+---------------+---------+-----------+----------+--------------+   PFV      Full                                                        +---------+---------------+---------+-----------+----------+--------------+ POP      Full           Yes      Yes                                 +---------+---------------+---------+-----------+----------+--------------+ PTV      Full                                                        +---------+---------------+---------+-----------+----------+--------------+ PERO     Full                                                        +---------+---------------+---------+-----------+----------+--------------+     Summary: BILATERAL: - No evidence of deep vein thrombosis seen in the lower extremities, bilaterally. - RIGHT: interstitial edema noted throughout  LEFT: Interstitial edema noted throughout.  *See table(s) above for measurements and observations.    Preliminary      Assessment and Plan:    Pulmonary Hypertension (Mixed phenotype Group II-III suspected) OSA- needing CPAP COPD History of PE HFpEF Aortic Atherosclerosis PACs Presenting with syncope - NYHA class I, Stage B, euvolemic,  etiology from HTN suspected - novel troponin < 100 - agree with orthostatics - SHARED DECISION MAKING:  Mixed picture PH is likely a component of this; needs outpatient sleep study, continued euvolemia, and COPD treatment.  Only if he has CTEPH would management possibly change (possible [place]ociguat).  Patient feels back to normal and is eager to leave:  Will need outpatient f/u, V/Q scan, and consideration of RHC at as outpatient if this is the case; this could be done safely if patient has no further events - outpatient lipids for prevention considerations - continue home torsemide; agree with TED Hose, would consider holding home metolazone 2.5 mg PO Daily at discharge and re-assess in 1-2 weeks - if orthostatic can DC ramipril - discussed with primary MD; unless issues with orthostatic or return of symptoms possible DC today  Risk Assessment/Risk Scores:        New York Heart Association (NYHA) Functional Class NYHA Class II    [facility] will sign off.   Medication Recommendations:  Hold metolazone; can hold  Other recommendations (labs, testing, etc):  Lipids, BMP in 1-2 week follow up; will need V/Q scan; may need RHC Follow up as an outpatient:  We are working to arrange 1-2 week follow up with Dr. Burt Knack, myself, or APP teammate  For questions or updates, please contact [place] Please consult [email or web address] for contact info under    Signed, Werner Lean, MD  12/03/2020 8:01 AM

## 2020-12-03 NOTE — ED Provider Notes (Signed)
Care assumed from Dr. Maryan Rued, patient with history of pulmonary embolism and heart failure comes to the hospital with new oxygen requirement and elevated BNP.  He is pending CT angiogram of the chest to rule out pulmonary embolism and will need to be admitted following that.  CT angiogram shows no evidence of pulmonary embolism, respiratory difficulty is felt to be related to diastolic heart failure.  He is given a dose of furosemide.  Repeat troponin is trending upward, he is given a dose of aspirin.  Case is discussed with Dr. Hal Hope of [facility], who agrees to admit the patient.  Results for orders placed or performed during the hospital encounter of 12/02/20  Resp Panel by RT-PCR (Flu A&B, Covid) Nasopharyngeal Swab   Specimen: Nasopharyngeal Swab; Nasopharyngeal(NP) swabs in vial transport medium  Result Value Ref Range   SARS Coronavirus 2 by RT PCR NEGATIVE NEGATIVE   Influenza A by PCR NEGATIVE NEGATIVE   Influenza B by PCR NEGATIVE NEGATIVE  Comprehensive metabolic panel  Result Value Ref Range   Sodium 135 135 - 145 mmol/L   Potassium 4.4 3.5 - 5.1 mmol/L   Chloride 97 (L) 98 - 111 mmol/L   CO2 32 22 - 32 mmol/L   Glucose, Bld 127 (H) 70 - 99 mg/dL   BUN 29 (H) 8 - 23 mg/dL   Creatinine, Ser 1.59 (H) 0.61 - 1.24 mg/dL   Calcium 8.7 (L) 8.9 - 10.3 mg/dL   Total Protein 6.1 (L) 6.5 - 8.1 g/dL   Albumin 2.9 (L) 3.5 - 5.0 g/dL   AST 25 15 - 41 U/L   ALT 17 0 - 44 U/L   Alkaline Phosphatase 80 38 - 126 U/L   Total Bilirubin 1.1 0.3 - 1.2 mg/dL   GFR, Estimated 43 (L) >60 mL/min   Anion gap 6 5 - 15  Brain natriuretic peptide  Result Value Ref Range   B Natriuretic Peptide 963.5 (H) 0.0 - 100.0 pg/mL  CBC with Differential  Result Value Ref Range   WBC 5.4 4.0 - 10.5 K/uL   RBC 5.24 4.22 - 5.81 MIL/uL   Hemoglobin 14.7 13.0 - 17.0 g/dL   HCT 45.2 39.0 - 52.0 %   MCV 86.3 80.0 - 100.0 fL   MCH 28.1 26.0 - 34.0 pg   MCHC 32.5 30.0 - 36.0 g/dL   RDW 15.1 11.5  - 15.5 %   Platelets 149 (L) 150 - 400 K/uL   nRBC 0.0 0.0 - 0.2 %   Neutrophils Relative % 74 %   Neutro Abs 4.1 1.7 - 7.7 K/uL   Lymphocytes Relative 12 %   Lymphs Abs 0.6 (L) 0.7 - 4.0 K/uL   Monocytes Relative 9 %   Monocytes Absolute 0.5 0.1 - 1.0 K/uL   Eosinophils Relative 3 %   Eosinophils Absolute 0.1 0.0 - 0.5 K/uL   Basophils Relative 1 %   Basophils Absolute 0.0 0.0 - 0.1 K/uL   Immature Granulocytes 1 %   Abs Immature Granulocytes 0.04 0.00 - 0.07 K/uL  D-dimer, quantitative  Result Value Ref Range   D-Dimer, Quant 10.32 (H) 0.00 - 0.50 ug/mL-FEU  Troponin I (High Sensitivity)  Result Value Ref Range   Troponin I (High Sensitivity) 40 (H) <18 ng/L  Troponin I (High Sensitivity)  Result Value Ref Range   Troponin I (High Sensitivity) 73 (H) <18 ng/L   CT Angio Chest PE W and/or Wo Contrast  Result Date: 12/03/2020 CLINICAL DATA:  Syncope, elevated D-dimer.  Low O2 sats. EXAM: CT ANGIOGRAPHY CHEST WITH CONTRAST TECHNIQUE: Multidetector CT imaging of the chest was performed using the standard protocol during bolus administration of intravenous contrast. Multiplanar CT image reconstructions and MIPs were obtained to evaluate the vascular anatomy. CONTRAST:  73m OMNIPAQUE IOHEXOL 350 MG/ML SOLN COMPARISON:  08/01/2018 FINDINGS: Cardiovascular: No filling defects in the pulmonary arteries to suggest pulmonary emboli. Cardiomegaly. Diffuse coronary artery calcifications. Scattered aortic calcifications. No aneurysm. Mediastinum/Nodes: No mediastinal, hilar, or axillary adenopathy. Trachea and esophagus are unremarkable. Thyroid unremarkable. Lungs/Pleura: Mild centrilobular emphysema. No confluent opacities or effusions. Minimal bibasilar atelectasis. Upper Abdomen: Imaging into the upper abdomen demonstrates no acute findings. Small hiatal hernia. Musculoskeletal: Chest wall soft tissues are unremarkable. No acute bony abnormality. Review of the MIP images confirms the above  findings. IMPRESSION: No evidence of pulmonary embolus. Cardiomegaly, diffuse coronary artery disease. Small hiatal hernia Aortic Atherosclerosis (ICD10-I70.0) and Emphysema (ICD10-J43.9). Electronically Signed   By: KRolm BaptiseM.D.   On: 12/03/2020 00:17   DG Chest Port 1 View  Result Date: 12/02/2020 CLINICAL DATA:  Syncope EXAM: PORTABLE CHEST 1 VIEW COMPARISON:  10/30/2020 FINDINGS: Cardiomegaly, vascular congestion. Mild hyperinflation of the lungs. No confluent opacities or effusions. No acute bony abnormality. IMPRESSION: Hyperinflation. Cardiomegaly, vascular congestion. Electronically Signed   By: KRolm BaptiseM.D.   On: 12/02/2020 19:32   VAS [place]OWER EXTREMITY VENOUS (DVT) (ONLY [facility] & [facility])  Result Date: 12/02/2020  Lower Venous DVT Study Indications: Edema, SOB, and syncope.  Risk Factors: CKD. Comparison Study: Prior negative bilateral lower extremity venous duplex was                   done 09/16/2014 Performing Technologist: CSharion DoveRVS  Examination Guidelines: A complete evaluation includes B-mode imaging, spectral Doppler, color Doppler, and power Doppler as needed of all accessible portions of each vessel. Bilateral testing is considered an integral part of a complete examination. Limited examinations for reoccurring indications may be performed as noted. The reflux portion of the exam is performed with the patient in reverse Trendelenburg.  +---------+---------------+---------+-----------+----------+--------------+ RIGHT    CompressibilityPhasicitySpontaneityPropertiesThrombus Aging +---------+---------------+---------+-----------+----------+--------------+ CFV      Full           Yes      Yes                                 +---------+---------------+---------+-----------+----------+--------------+ SFJ      Full                                                        +---------+---------------+---------+-----------+----------+--------------+ FV Prox  Full                                                         +---------+---------------+---------+-----------+----------+--------------+ FV Mid   Full                                                        +---------+---------------+---------+-----------+----------+--------------+  FV DistalFull                                                        +---------+---------------+---------+-----------+----------+--------------+ PFV      Full                                                        +---------+---------------+---------+-----------+----------+--------------+ POP      Full           Yes      Yes                                 +---------+---------------+---------+-----------+----------+--------------+ PTV      Full                                                        +---------+---------------+---------+-----------+----------+--------------+ PERO     Full                                                        +---------+---------------+---------+-----------+----------+--------------+   +---------+---------------+---------+-----------+----------+--------------+ LEFT     CompressibilityPhasicitySpontaneityPropertiesThrombus Aging +---------+---------------+---------+-----------+----------+--------------+ CFV      Full           Yes      Yes                                 +---------+---------------+---------+-----------+----------+--------------+ SFJ      Full                                                        +---------+---------------+---------+-----------+----------+--------------+ FV Prox  Full                                                        +---------+---------------+---------+-----------+----------+--------------+ FV Mid   Full                                                        +---------+---------------+---------+-----------+----------+--------------+ FV DistalFull                                                         +---------+---------------+---------+-----------+----------+--------------+  PFV      Full                                                        +---------+---------------+---------+-----------+----------+--------------+ POP      Full           Yes      Yes                                 +---------+---------------+---------+-----------+----------+--------------+ PTV      Full                                                        +---------+---------------+---------+-----------+----------+--------------+ PERO     Full                                                        +---------+---------------+---------+-----------+----------+--------------+     Summary: BILATERAL: - No evidence of deep vein thrombosis seen in the lower extremities, bilaterally. - RIGHT: interstitial edema noted throughout  LEFT: Interstitial edema noted throughout.  *See table(s) above for measurements and observations.    Preliminary    Images viewed by me.    Delora Fuel, MD 00/16/42 [phone]

## 2020-12-03 NOTE — Progress Notes (Signed)
Per Dr. Oralia Rud request, have sent a message to our office's scheduling team requesting a follow-up appointment within 1-2 weeks with either Dr. Burt Knack or Dr. Gasper Sells, and our office will call the patient with this information.

## 2020-12-03 NOTE — Discharge Summary (Signed)
Physician Discharge Summary  [name]:[id number] DOB: 11-19-1935 DOA: 12/02/2020  PCP: Jani Gravel, MD  Admit date: 12/02/2020 Discharge date: 12/03/2020  Admitted From: home  Disposition:  home   Recommendations for Outpatient Follow-up:  1. F/u with cardiology in 2 wks  Home Health:  none  Discharge Condition:  stable   CODE STATUS:  Full code   Diet recommendation:  Heart healthy  Consultations:  cardiology  Procedures/Studies: . none   Discharge Diagnoses:  Principal Problem:   Syncope and collapse Active Problems:   COPD (chronic obstructive pulmonary disease) ([facility])   Essential hypertension   CHF (congestive heart failure) ([facility])   Cor pulmonale (chronic) ([facility])     Brief Summary: Joshua [name] is an 85 y 29 male with BPH, COPD not on home O2, HTN, h/o large PE, chronic RV failure (cor pulmonale) which I suspect is due to large b/l PE after at [facility] (09/15/14 CT chest showed a large bilateral pulmonary embolism with RV dilatation).  His last PFTs were in 2016 (FEV1 59% w/o response to bronchodilators). 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  He presents after passing out. He was climbing the stairs when he became dizzy and became unresponsive. Remained unresponsive for about 5 min. Fire Dept found his pulse ox to be in the 70s. "He was orthostatic but not hypotensive" per ED notes.     D dimer > 10 in ED. CTA and LE venous duplex neg for thrombus.  Mild pedal edema. No room air pulse ox in ED. Given Lasix 40 mg IV and admitted.  Further history>  He was feeling bad last week.This improved by Thursday. He was started on Neurontin TID for his hiccups by Dr Benson Norway a few wks ago. It has resolved his hiccups. He does recall that is makes him sleep more after he takes it. He has been falling asleep more in the middle of the day. He also felt  bad last week after his 2nd COVID booster on 4/8 and this made his arm swell up.   Hospital Course:  Principal Problem:   Syncope and collapse, Mild troponin elevation - Orthostatic vitals today are negative - EKG > chronic RBBB - CTA neg for PE - Troponin 40> 73> 80> 66 - Tele normal - cardiology has seen him - thinking possibly exertional syncope due to pulm HTN - there was a question of another episode of syncope earlier this week but he confirms with me that this was a fall because his knees gave out and there was no loss of consciousness   Active Problems:  Hypoxia - hypoxic yesterday when fire dept arrived- ? OSA - pulse ox is 98% today and there is no dyspnea     Cor pulmonale (chronic) with chronic pedal edema RBBB on EKG - cor pulmonale due to large b/l PE in 2016 with RV dilatation and severely increased pulmonary pressures at 68 mm - give Lasix 40 mg IV twice so far since admission- wt down by 1 Kg and in neg balance by 1 L - will need TEDS for pedal edema as well- he says he has a few pairs at home and states he will begin wearing them - have asked for a cards eval- recommended to hold Metolazone - they will set up an appt for 2 wks from now-  COPD (chronic obstructive pulmonary disease)  ? - COPD noted on chart - he smoked for about 57 yrs- stopped around 2006 - last PFTs in 2016 >> 11/2014 - FEV1 59%, no BD response, ratio 70, TLC 68%, DLCO 35% - recommend repeat PFTs  - on Spiriva per med rec  Mild Thrombocytopenia - mildly low platelets date back to 2016 in Epic- no other labs prior to this in Epic  CKD 3b - follow Cr  BPH - Cardura at bedtime and Proscar  Gout - Allopurinol   Hiccups EGD done for hiccups and weight loss EGD> Localized granular mucosa was found at the pylorus. 2 cm hiatal hernia Path> Reactive gastropathy and intestinal metaplasia - already on PPI - started on Neurontin 100 TID by Dr Benson Norway   Discharge Exam: Vitals:    12/03/20 1041 12/03/20 1128  BP: 111/72 105/68  Pulse: 76 82  Resp: 16 16  Temp:  98.2 F (36.8 C)  SpO2: 98% 94%   Vitals:   12/03/20 0840 12/03/20 0905 12/03/20 1041 12/03/20 1128  BP:  107/64 111/72 105/68  Pulse:  76 76 82  Resp:  _0 Temp:  98 F (36.7 C)  98.2 F (36.8 C)  TempSrc:  Oral  Oral  SpO2: 98% 92% 98% 94%  Weight:      Height:        General: Pt is alert, awake, not in acute distress Cardiovascular: RRR, S1/S2 +, no rubs, no gallops Respiratory: CTA bilaterally, no wheezing, no rhonchi Abdominal: Soft, NT, ND, bowel sounds + Extremities: no edema, no cyanosis   Discharge Instructions  Discharge Instructions    (HEART FAILURE PATIENTS) Call MD:  Anytime you have any of the following symptoms: 1) 3 pound weight gain in 24 hours or 5 pounds in 1 week 2) shortness of breath, with or without a dry hacking cough 3) swelling in the hands, feet or stomach 4) if you have to sleep on extra pillows at night in order to breathe.   Complete by: As directed    Diet - low sodium heart healthy   Complete by: As directed    Increase activity slowly   Complete by: As directed      Allergies as of 12/03/2020      Reactions   Other Swelling   Beer- Swelling    Sunflower Oil Swelling   Sulfa Antibiotics Rash      Medication List    STOP taking these medications   acetaminophen 500 MG tablet Commonly known as: TYLENOL     TAKE these medications   allopurinol 100 MG tablet Commonly known as: ZYLOPRIM Take 100 mg by mouth every morning.   aspirin EC 81 MG tablet Take 81 mg by mouth every morning. Swallow whole.   Cinnamon 500 MG capsule Take 500 mg by mouth every morning.   doxazosin 4 MG tablet Commonly known as: CARDURA TAKE 1 TABLET BY MOUTH EVERY DAY What changed: when to take this   doxycycline 100 MG tablet Commonly known as: VIBRA-TABS Take 100 mg by mouth 2 (two) times daily as needed (skin knot on back of neck).   finasteride 5 MG  tablet Commonly known as: PROSCAR Take 5 mg by mouth at bedtime.   gabapentin 100 MG capsule Commonly known as: NEURONTIN Take 100 mg by mouth 3 (three) times daily.   ibuprofen 200 MG tablet Commonly known as: ADVIL Take 400 mg by mouth daily as needed for headache (pain).  Magnesium Oxide 400 MG Caps Take one capsule by mouth twice daily for 2 weeks, then decrease to once daily thereafter.   magnesium oxide 400 MG tablet Commonly known as: MAG-OX Take 400 mg by mouth every morning.   metolazone 2.5 MG tablet Commonly known as: ZAROXOLYN Take 1 tablet (2.5 mg total) by mouth daily as needed. Take 30 minutes before torsemide for 3 lb weight gain. Do not take more than 2 x per week. What changed:   reasons to take this  additional instructions   pantoprazole 40 MG tablet Commonly known as: PROTONIX Take 40 mg by mouth every morning.   potassium chloride 10 MEQ tablet Commonly known as: KLOR-CON Take 1 tablet (10 mEq total) by mouth daily. What changed: when to take this   ramipril 2.5 MG capsule Commonly known as: ALTACE Take 2.5 mg by mouth every morning.   Spiriva Respimat 1.25 MCG/ACT Aers Generic drug: Tiotropium Bromide Monohydrate INHALE 2 PUFFS BY MOUTH INTO THE LUNGS DAILY What changed: See the new instructions.   torsemide 20 MG tablet Commonly known as: DEMADEX Take one tablet by mouth ( 20 mg) twice daily. Take one extra tablet ( 20 mg ) for extra 3 lbs in one day. What changed:   how much to take  how to take this  when to take this  additional instructions       Follow-up Information    Sherren Mocha, MD Follow up.   Specialty: Cardiology Why: [place] office will call you to arrange close follow-up appointment. Contact information: [address] [place] [address] [phone]              Allergies  Allergen Reactions  . Other Swelling    Beer- Swelling   . Sunflower Oil Swelling  . Sulfa  Antibiotics Rash      CT Angio Chest PE W and/or Wo Contrast  Result Date: 12/03/2020 CLINICAL DATA:  Syncope, elevated D-dimer.  Low O2 sats. EXAM: CT ANGIOGRAPHY CHEST WITH CONTRAST TECHNIQUE: Multidetector CT imaging of the chest was performed using the standard protocol during bolus administration of intravenous contrast. Multiplanar CT image reconstructions and MIPs were obtained to evaluate the vascular anatomy. CONTRAST:  19m OMNIPAQUE IOHEXOL 350 MG/ML SOLN COMPARISON:  08/01/2018 FINDINGS: Cardiovascular: No filling defects in the pulmonary arteries to suggest pulmonary emboli. Cardiomegaly. Diffuse coronary artery calcifications. Scattered aortic calcifications. No aneurysm. Mediastinum/Nodes: No mediastinal, hilar, or axillary adenopathy. Trachea and esophagus are unremarkable. Thyroid unremarkable. Lungs/Pleura: Mild centrilobular emphysema. No confluent opacities or effusions. Minimal bibasilar atelectasis. Upper Abdomen: Imaging into the upper abdomen demonstrates no acute findings. Small hiatal hernia. Musculoskeletal: Chest wall soft tissues are unremarkable. No acute bony abnormality. Review of the MIP images confirms the above findings. IMPRESSION: No evidence of pulmonary embolus. Cardiomegaly, diffuse coronary artery disease. Small hiatal hernia Aortic Atherosclerosis (ICD10-I70.0) and Emphysema (ICD10-J43.9). Electronically Signed   By: KRolm BaptiseM.D.   On: 12/03/2020 00:17   DG Chest Port 1 View  Result Date: 12/02/2020 CLINICAL DATA:  Syncope EXAM: PORTABLE CHEST 1 VIEW COMPARISON:  10/30/2020 FINDINGS: Cardiomegaly, vascular congestion. Mild hyperinflation of the lungs. No confluent opacities or effusions. No acute bony abnormality. IMPRESSION: Hyperinflation. Cardiomegaly, vascular congestion. Electronically Signed   By: KRolm BaptiseM.D.   On: 12/02/2020 19:32   VAS [place]OWER EXTREMITY VENOUS (DVT) (ONLY [facility] & [facility])  Result Date: 12/03/2020  Lower Venous DVT Study  Indications: Edema, SOB, and syncope.  Risk Factors: CKD. Comparison  Study: Prior negative bilateral lower extremity venous duplex was                   done 09/16/2014 Performing Technologist: Sharion Dove RVS  Examination Guidelines: A complete evaluation includes B-mode imaging, spectral Doppler, color Doppler, and power Doppler as needed of all accessible portions of each vessel. Bilateral testing is considered an integral part of a complete examination. Limited examinations for reoccurring indications may be performed as noted. The reflux portion of the exam is performed with the patient in reverse Trendelenburg.  +---------+---------------+---------+-----------+----------+--------------+ RIGHT    CompressibilityPhasicitySpontaneityPropertiesThrombus Aging +---------+---------------+---------+-----------+----------+--------------+ CFV      Full           Yes      Yes                                 +---------+---------------+---------+-----------+----------+--------------+ SFJ      Full                                                        +---------+---------------+---------+-----------+----------+--------------+ FV Prox  Full                                                        +---------+---------------+---------+-----------+----------+--------------+ FV Mid   Full                                                        +---------+---------------+---------+-----------+----------+--------------+ FV DistalFull                                                        +---------+---------------+---------+-----------+----------+--------------+ PFV      Full                                                        +---------+---------------+---------+-----------+----------+--------------+ POP      Full           Yes      Yes                                 +---------+---------------+---------+-----------+----------+--------------+ PTV      Full                                                         +---------+---------------+---------+-----------+----------+--------------+ PERO     Full                                                        +---------+---------------+---------+-----------+----------+--------------+   +---------+---------------+---------+-----------+----------+--------------+  LEFT     CompressibilityPhasicitySpontaneityPropertiesThrombus Aging +---------+---------------+---------+-----------+----------+--------------+ CFV      Full           Yes      Yes                                 +---------+---------------+---------+-----------+----------+--------------+ SFJ      Full                                                        +---------+---------------+---------+-----------+----------+--------------+ FV Prox  Full                                                        +---------+---------------+---------+-----------+----------+--------------+ FV Mid   Full                                                        +---------+---------------+---------+-----------+----------+--------------+ FV DistalFull                                                        +---------+---------------+---------+-----------+----------+--------------+ PFV      Full                                                        +---------+---------------+---------+-----------+----------+--------------+ POP      Full           Yes      Yes                                 +---------+---------------+---------+-----------+----------+--------------+ PTV      Full                                                        +---------+---------------+---------+-----------+----------+--------------+ PERO     Full                                                        +---------+---------------+---------+-----------+----------+--------------+     Summary: BILATERAL: - No evidence of deep vein thrombosis seen in the  lower extremities, bilaterally. - RIGHT: interstitial edema noted throughout  LEFT: Interstitial edema noted throughout.  *See table(s) above for measurements and observations. Electronically signed by Servando Snare MD on 12/03/2020  at 8:58:31 AM.    Final      The results of significant diagnostics from this hospitalization (including imaging, microbiology, ancillary and laboratory) are listed below for reference.     Microbiology: Recent Results (from the past 240 hour(s))  Resp Panel by RT-PCR (Flu A&B, Covid) Nasopharyngeal Swab     Status: None   Collection Time: 12/02/20  8:04 PM   Specimen: Nasopharyngeal Swab; Nasopharyngeal(NP) swabs in vial transport medium  Result Value Ref Range Status   SARS Coronavirus 2 by RT PCR NEGATIVE NEGATIVE Final    Comment: (NOTE) SARS-CoV-2 target nucleic acids are NOT DETECTED.  The SARS-CoV-2 RNA is generally detectable in upper respiratory specimens during the acute phase of infection. The lowest concentration of SARS-CoV-2 viral copies this assay can detect is 138 copies/mL. A negative result does not preclude SARS-Cov-2 infection and should not be used as the sole basis for treatment or other patient management decisions. A negative result may occur with  improper specimen collection/handling, submission of specimen other than nasopharyngeal swab, presence of viral mutation(s) within the areas targeted by this assay, and inadequate number of viral copies(<138 copies/mL). A negative result must be combined with clinical observations, patient history, and epidemiological information. The expected result is Negative.  Fact Sheet for Patients:  EntrepreneurPulse.com.au  Fact Sheet for Healthcare Providers:  IncredibleEmployment.be  This test is no t yet approved or cleared by the [place] FDA and  has been authorized for detection and/or diagnosis of SARS-CoV-2 by FDA under an Emergency Use  Authorization (EUA). This EUA will remain  in effect (meaning this test can be used) for the duration of the COVID-19 declaration under Section 564(b)(1) of the Act, 21 U.S.C.section 360bbb-3(b)(1), unless the authorization is terminated  or revoked sooner.       Influenza A by PCR NEGATIVE NEGATIVE Final   Influenza B by PCR NEGATIVE NEGATIVE Final    Comment: (NOTE) The Xpert Xpress SARS-CoV-2/FLU/RSV plus assay is intended as an aid in the diagnosis of influenza from Nasopharyngeal swab specimens and should not be used as a sole basis for treatment. Nasal washings and aspirates are unacceptable for Xpert Xpress SARS-CoV-2/FLU/RSV testing.  Fact Sheet for Patients: EntrepreneurPulse.com.au  Fact Sheet for Healthcare Providers: IncredibleEmployment.be  This test is not yet approved or cleared by the [place] FDA and has been authorized for detection and/or diagnosis of SARS-CoV-2 by FDA under an Emergency Use Authorization (EUA). This EUA will remain in effect (meaning this test can be used) for the duration of the COVID-19 declaration under Section 564(b)(1) of the Act, 21 U.S.C. section 360bbb-3(b)(1), unless the authorization is terminated or revoked.  Performed at [facility], [place] [address], [place], [place] [address]      Labs: BNP (last 3 results) Recent Labs    12/02/20 2004  BNP 638.4*   Basic Metabolic Panel: Recent Labs  Lab 11/28/20 1119 12/02/20 2004 12/03/20 0456  NA 140 135 139  K 3.9 4.4 3.8  CL 98 97* 100  CO2 27 32 32  GLUCOSE 119* 127* 98  BUN 31* 29* 26*  CREATININE 1.80* 1.59* 1.35*  CALCIUM 9.2 8.7* 8.8*  MG 1.6  --  1.7   Liver Function Tests: Recent Labs  Lab 12/02/20 2004 12/03/20 0456  AST 25 18  ALT 17 16  ALKPHOS 80 83  BILITOT 1.1 0.6  PROT 6.1* 6.3*  ALBUMIN 2.9* 3.0*   No results for input(s): LIPASE, AMYLASE in the last 168 hours.  No results for input(s): AMMONIA in  the last 168 hours. CBC: Recent Labs  Lab 12/02/20 2004 12/03/20 0330 12/03/20 0638  WBC 5.4 6.0 5.2  NEUTROABS 4.1  --  3.7  HGB 14.7 15.1 14.8  HCT 45.2 46.1 44.8  MCV 86.3 86.8 86.2  PLT 149* 143* 148*   Cardiac Enzymes: No results for input(s): CKTOTAL, CKMB, CKMBINDEX, TROPONINI in the last 168 hours. BNP: Invalid input(s): POCBNP CBG: No results for input(s): GLUCAP in the last 168 hours. D-Dimer Recent Labs    12/02/20 2004  DDIMER 10.32*   Hgb A1c No results for input(s): HGBA1C in the last 72 hours. Lipid Profile No results for input(s): CHOL, HDL, LDLCALC, TRIG, CHOLHDL, LDLDIRECT in the last 72 hours. Thyroid function studies No results for input(s): TSH, T4TOTAL, T3FREE, THYROIDAB in the last 72 hours.  Invalid input(s): FREET3 Anemia work up No results for input(s): VITAMINB12, FOLATE, FERRITIN, TIBC, IRON, RETICCTPCT in the last 72 hours. Urinalysis    Component Value Date/Time   COLORURINE YELLOW 08/24/2017 2138   APPEARANCEUR CLEAR 08/24/2017 2138   LABSPEC 1.020 08/24/2017 2138   PHURINE 5.0 08/24/2017 2138   GLUCOSEU NEGATIVE 08/24/2017 2138   HGBUR NEGATIVE 08/24/2017 2138   BILIRUBINUR NEGATIVE 08/24/2017 2138   KETONESUR 5 (A) 08/24/2017 2138   PROTEINUR 30 (A) 08/24/2017 2138   UROBILINOGEN 0.2 09/08/2014 0927   NITRITE NEGATIVE 08/24/2017 2138   LEUKOCYTESUR NEGATIVE 08/24/2017 2138   Sepsis Labs Invalid input(s): PROCALCITONIN,  WBC,  LACTICIDVEN Microbiology Recent Results (from the past 240 hour(s))  Resp Panel by RT-PCR (Flu A&B, Covid) Nasopharyngeal Swab     Status: None   Collection Time: 12/02/20  8:04 PM   Specimen: Nasopharyngeal Swab; Nasopharyngeal(NP) swabs in vial transport medium  Result Value Ref Range Status   SARS Coronavirus 2 by RT PCR NEGATIVE NEGATIVE Final    Comment: (NOTE) SARS-CoV-2 target nucleic acids are NOT DETECTED.  The SARS-CoV-2 RNA is generally detectable in upper respiratory specimens during  the acute phase of infection. The lowest concentration of SARS-CoV-2 viral copies this assay can detect is 138 copies/mL. A negative result does not preclude SARS-Cov-2 infection and should not be used as the sole basis for treatment or other patient management decisions. A negative result may occur with  improper specimen collection/handling, submission of specimen other than nasopharyngeal swab, presence of viral mutation(s) within the areas targeted by this assay, and inadequate number of viral copies(<138 copies/mL). A negative result must be combined with clinical observations, patient history, and epidemiological information. The expected result is Negative.  Fact Sheet for Patients:  EntrepreneurPulse.com.au  Fact Sheet for Healthcare Providers:  IncredibleEmployment.be  This test is no t yet approved or cleared by the [place] FDA and  has been authorized for detection and/or diagnosis of SARS-CoV-2 by FDA under an Emergency Use Authorization (EUA). This EUA will remain  in effect (meaning this test can be used) for the duration of the COVID-19 declaration under Section 564(b)(1) of the Act, 21 U.S.C.section 360bbb-3(b)(1), unless the authorization is terminated  or revoked sooner.       Influenza A by PCR NEGATIVE NEGATIVE Final   Influenza B by PCR NEGATIVE NEGATIVE Final    Comment: (NOTE) The Xpert Xpress SARS-CoV-2/FLU/RSV plus assay is intended as an aid in the diagnosis of influenza from Nasopharyngeal swab specimens and should not be used as a sole basis for treatment. Nasal washings and aspirates are unacceptable for Xpert Xpress SARS-CoV-2/FLU/RSV testing.  Fact Sheet for  Patients: EntrepreneurPulse.com.au  Fact Sheet for Healthcare Providers: IncredibleEmployment.be  This test is not yet approved or cleared by the [place] FDA and has been authorized for detection and/or  diagnosis of SARS-CoV-2 by FDA under an Emergency Use Authorization (EUA). This EUA will remain in effect (meaning this test can be used) for the duration of the COVID-19 declaration under Section 564(b)(1) of the Act, 21 U.S.C. section 360bbb-3(b)(1), unless the authorization is terminated or revoked.  Performed at [facility], [place] [address], [place], [place] [address]      Time coordinating discharge in minutes: 65  SIGNED:   Debbe Odea, MD  [facility] 12/03/2020, 12:58 PM

## 2020-12-03 NOTE — H&P (Signed)
History and Physical    Joshua [name] YSA:[id number] DOB: [birth year] DOA: 12/02/2020  PCP: Jani Gravel, MD  Patient coming from: Home.  Chief Complaint: Shortness of breath and loss of consciousness.  HPI: Joshua [name] is a 85 y.o. male with history of chronic diastolic CHF, pulmonary hypertension, COPD was brought to the ER after patient had a brief episode of loss of consciousness.  Patient states he was walking up the stairs in his house when after reaching first floor he took few steps and lost consciousness.  Prior to losing consciousness he was short of breath.  Denies any chest pain palpitations or any weakness of the extremities.  Patient states episode was witnessed by his wife and he may have lost consciousness for about 5 minutes did not have any incontinence of urine.  He has been short of breath for the last few days and is torsemide dose was decreased by his cardiologist by half over the last 1 week.  He may have gained about a 2 pounds over the last few days.  Has increasing swelling of the lower extremities.  ED Course: In the ER patient was mildly hypoxic requiring 2 L oxygen to maintain sats and the D-dimer was markedly elevated at 10.3 and CT angiogram of the chest done was negative for PE and Dopplers of the lower extremity just shows edema no DVT.  BNP is 963 high sensitive troponin was 40 and 73.  Creatinine is at baseline of 1.5 platelets are low at 149.  EKG shows normal sinus rhythm RBBB APCs. Covid test is negative.  Patient is admitted for CHF and syncope.  Lasix 40 mg IV was given.  Review of Systems: As per HPI, rest all negative.   Past Medical History:  Diagnosis Date  . Arthritis   . BPH (benign prostatic hyperplasia)   . COPD (chronic obstructive pulmonary disease) ([place])   . Folliculitis    posterior scalp per office visit note of Dr Maudie Mercury 07/20/2014    . GERD (gastroesophageal reflux disease)   . Hypertension   . PE (pulmonary thromboembolism) ([place])   .  Phlebitis    right arm  at least 20 years ago   . Pneumonia    hx of pneumonia as a child   . Sleep apnea     Past Surgical History:  Procedure Laterality Date  . BIOPSY  11/03/2020   Procedure: BIOPSY;  Surgeon: Carol Ada, MD;  Location: [facility];  Service: Endoscopy;;  . bone removed from little toe right foot     . CHOLECYSTECTOMY    . ESOPHAGOGASTRODUODENOSCOPY (EGD) WITH PROPOFOL N/A 11/03/2020   Procedure: ESOPHAGOGASTRODUODENOSCOPY (EGD) WITH PROPOFOL;  Surgeon: Carol Ada, MD;  Location: [facility];  Service: Endoscopy;  Laterality: N/A;  . pilonidal cyst removal     . TOTAL KNEE ARTHROPLASTY Left 09/13/2014   Procedure: LEFT TOTAL KNEE ARTHROPLASTY;  Surgeon: Mauri Pole, MD;  Location: [facility];  Service: Orthopedics;  Laterality: Left;     reports that he quit smoking about 14 years ago. His smoking use included cigarettes. He has a 50.00 pack-year smoking history. He has never used smokeless tobacco. He reports current alcohol use of about 6.0 standard drinks of alcohol per week. He reports that he does not use drugs.  Allergies  Allergen Reactions  . Other Swelling    Beer- Swelling   . Sunflower Oil Swelling  . Sulfa Antibiotics Rash    Family History  Problem Relation Age of  Onset  . Heart attack Brother 68    Prior to Admission medications   Medication Sig Start Date End Date Taking? Authorizing Provider  acetaminophen (TYLENOL) 500 MG tablet Take 1,000 mg by mouth daily as needed for moderate pain or headache.   Yes [provider]  allopurinol (ZYLOPRIM) 100 MG tablet Take 100 mg by mouth every morning.   Yes [provider]  aspirin EC 81 MG tablet Take 81 mg by mouth every morning. Swallow whole.   Yes [provider]  Cinnamon 500 MG capsule Take 500 mg by mouth every morning.   Yes [provider]  doxazosin (CARDURA) 4 MG tablet TAKE 1 TABLET BY MOUTH EVERY DAY Patient taking differently: Take 4 mg by  mouth at bedtime. 11/21/20  Yes McKenzie, Candee Furbish, MD  doxycycline (VIBRA-TABS) 100 MG tablet Take 100 mg by mouth 2 (two) times daily as needed (skin knot on back of neck). 12/14/18  Yes [provider]  finasteride (PROSCAR) 5 MG tablet Take 5 mg by mouth at bedtime.   Yes [provider]  gabapentin (NEURONTIN) 100 MG capsule Take 100 mg by mouth 3 (three) times daily. 11/27/20  Yes [provider]  ibuprofen (ADVIL) 200 MG tablet Take 400 mg by mouth daily as needed for headache (pain).   Yes [provider]  magnesium oxide (MAG-OX) 400 MG tablet Take 400 mg by mouth every morning. 11/14/20  Yes [provider]  metolazone (ZAROXOLYN) 2.5 MG tablet Take 1 tablet (2.5 mg total) by mouth daily as needed. Take 30 minutes before torsemide for 3 lb weight gain. Do not take more than 2 x per week. Patient taking differently: Take 2.5 mg by mouth daily as needed (Take 30 minutes before torsemide for 3 lb weight gain. Do not take more than 2 x per week.). 10/23/20  Yes Sherren Mocha, MD  pantoprazole (PROTONIX) 40 MG tablet Take 40 mg by mouth every morning.   Yes [provider]  potassium chloride (KLOR-CON) 10 MEQ tablet Take 1 tablet (10 mEq total) by mouth daily. Patient taking differently: Take 10 mEq by mouth every morning. 11/14/20  Yes Weaver, Scott T, PA-C  ramipril (ALTACE) 2.5 MG capsule Take 2.5 mg by mouth every morning.   Yes [provider]  SPIRIVA RESPIMAT 1.25 MCG/ACT AERS INHALE 2 PUFFS BY MOUTH INTO THE LUNGS DAILY Patient taking differently: Inhale 2 puffs into the lungs at bedtime. 09/21/20  Yes Lauraine Rinne, NP  torsemide (DEMADEX) 20 MG tablet Take one tablet by mouth ( 20 mg) twice daily. Take one extra tablet ( 20 mg ) for extra 3 lbs in one day. Patient taking differently: Take 20-40 mg by mouth See admin instructions. Take 2 tablets (40 mg) by mouth every morning, take 1 tablet (20 mg) every afternoon 11/29/20  Yes  Richardson Dopp T, PA-C  Magnesium Oxide 400 MG CAPS Take one capsule by mouth twice daily for 2 weeks, then decrease to once daily thereafter. Patient not taking: Reported on 12/02/2020 11/14/20   Liliane Shi, PA-C    Physical Exam: Constitutional: Moderately built and nourished. Vitals:   12/02/20 2115 12/02/20 2130 12/03/20 0116 12/03/20 0348  BP: 100/80 103/69 119/83 112/80  Pulse: 64 63 70 65  Resp: _0 Temp:   98 F (36.7 C)   TempSrc:   Oral Oral  SpO2: 95% 96% 98% 98%  Weight:      Height:  Eyes: Anicteric no pallor. ENMT: No discharge from the ears eyes nose or mouth. Neck: No mass felt.  No neck rigidity.  No JVD appreciated. Respiratory: No rhonchi or crepitations. Cardiovascular: S1-S2 heard. Abdomen: Soft nontender bowel sounds present. Musculoskeletal: Bilateral lower extremity edema present. Skin: No rash. Neurologic: Alert awake oriented to time place and person.  Moves all extremities. Psychiatric: Appears normal.  Normal affect.   Labs on Admission: I have personally reviewed following labs and imaging studies  CBC: Recent Labs  Lab 12/02/20 2004 12/03/20 0330  WBC 5.4 6.0  NEUTROABS 4.1  --   HGB 14.7 15.1  HCT 45.2 46.1  MCV 86.3 86.8  PLT 149* 939*   Basic Metabolic Panel: Recent Labs  Lab 11/28/20 1119 12/02/20 2004  NA 140 135  K 3.9 4.4  CL 98 97*  CO2 27 32  GLUCOSE 119* 127*  BUN 31* 29*  CREATININE 1.80* 1.59*  CALCIUM 9.2 8.7*  MG 1.6  --    GFR: Estimated Creatinine Clearance: 46.5 mL/min (A) (by C-G formula based on SCr of 1.59 mg/dL (H)). Liver Function Tests: Recent Labs  Lab 12/02/20 2004  AST 25  ALT 17  ALKPHOS 80  BILITOT 1.1  PROT 6.1*  ALBUMIN 2.9*   No results for input(s): LIPASE, AMYLASE in the last 168 hours. No results for input(s): AMMONIA in the last 168 hours. Coagulation Profile: No results for input(s): INR, PROTIME in the last 168 hours. Cardiac Enzymes: No results for  input(s): CKTOTAL, CKMB, CKMBINDEX, TROPONINI in the last 168 hours. BNP (last 3 results) No results for input(s): PROBNP in the last 8760 hours. HbA1C: No results for input(s): HGBA1C in the last 72 hours. CBG: No results for input(s): GLUCAP in the last 168 hours. Lipid Profile: No results for input(s): CHOL, HDL, LDLCALC, TRIG, CHOLHDL, LDLDIRECT in the last 72 hours. Thyroid Function Tests: No results for input(s): TSH, T4TOTAL, FREET4, T3FREE, THYROIDAB in the last 72 hours. Anemia Panel: No results for input(s): VITAMINB12, FOLATE, FERRITIN, TIBC, IRON, RETICCTPCT in the last 72 hours. Urine analysis:    Component Value Date/Time   COLORURINE YELLOW 08/24/2017 2138   APPEARANCEUR CLEAR 08/24/2017 2138   LABSPEC 1.020 08/24/2017 2138   PHURINE 5.0 08/24/2017 2138   GLUCOSEU NEGATIVE 08/24/2017 2138   HGBUR NEGATIVE 08/24/2017 2138   BILIRUBINUR NEGATIVE 08/24/2017 2138   KETONESUR 5 (A) 08/24/2017 2138   PROTEINUR 30 (A) 08/24/2017 2138   UROBILINOGEN 0.2 09/08/2014 0927   NITRITE NEGATIVE 08/24/2017 2138   LEUKOCYTESUR NEGATIVE 08/24/2017 2138   Sepsis Labs: _0 (procalcitonin:4,lacticidven:4) ) Recent Results (from the past 240 hour(s))  Resp Panel by RT-PCR (Flu A&B, Covid) Nasopharyngeal Swab     Status: None   Collection Time: 12/02/20  8:04 PM   Specimen: Nasopharyngeal Swab; Nasopharyngeal(NP) swabs in vial transport medium  Result Value Ref Range Status   SARS Coronavirus 2 by RT PCR NEGATIVE NEGATIVE Final    Comment: (NOTE) SARS-CoV-2 target nucleic acids are NOT DETECTED.  The SARS-CoV-2 RNA is generally detectable in upper respiratory specimens during the acute phase of infection. The lowest concentration of SARS-CoV-2 viral copies this assay can detect is 138 copies/mL. A negative result does not preclude SARS-Cov-2 infection and should not be used as the sole basis for treatment or other patient management decisions. A negative result may occur  with  improper specimen collection/handling, submission of specimen other than nasopharyngeal swab, presence of viral mutation(s) within the areas targeted by this assay, and inadequate number  of viral copies(<138 copies/mL). A negative result must be combined with clinical observations, patient history, and epidemiological information. The expected result is Negative.  Fact Sheet for Patients:  EntrepreneurPulse.com.au  Fact Sheet for Healthcare Providers:  IncredibleEmployment.be  This test is no t yet approved or cleared by the [place] FDA and  has been authorized for detection and/or diagnosis of SARS-CoV-2 by FDA under an Emergency Use Authorization (EUA). This EUA will remain  in effect (meaning this test can be used) for the duration of the COVID-19 declaration under Section 564(b)(1) of the Act, 21 U.S.C.section 360bbb-3(b)(1), unless the authorization is terminated  or revoked sooner.       Influenza A by PCR NEGATIVE NEGATIVE Final   Influenza B by PCR NEGATIVE NEGATIVE Final    Comment: (NOTE) The Xpert Xpress SARS-CoV-2/FLU/RSV plus assay is intended as an aid in the diagnosis of influenza from Nasopharyngeal swab specimens and should not be used as a sole basis for treatment. Nasal washings and aspirates are unacceptable for Xpert Xpress SARS-CoV-2/FLU/RSV testing.  Fact Sheet for Patients: EntrepreneurPulse.com.au  Fact Sheet for Healthcare Providers: IncredibleEmployment.be  This test is not yet approved or cleared by the [place] FDA and has been authorized for detection and/or diagnosis of SARS-CoV-2 by FDA under an Emergency Use Authorization (EUA). This EUA will remain in effect (meaning this test can be used) for the duration of the COVID-19 declaration under Section 564(b)(1) of the Act, 21 U.S.C. section 360bbb-3(b)(1), unless the authorization is terminated  or revoked.  Performed at [facility], [place] [address] [place], [place] [address]      Radiological Exams on Admission: CT Angio Chest PE W and/or Wo Contrast  Result Date: 12/03/2020 CLINICAL DATA:  Syncope, elevated D-dimer.  Low O2 sats. EXAM: CT ANGIOGRAPHY CHEST WITH CONTRAST TECHNIQUE: Multidetector CT imaging of the chest was performed using the standard protocol during bolus administration of intravenous contrast. Multiplanar CT image reconstructions and MIPs were obtained to evaluate the vascular anatomy. CONTRAST:  42m OMNIPAQUE IOHEXOL 350 MG/ML SOLN COMPARISON:  08/01/2018 FINDINGS: Cardiovascular: No filling defects in the pulmonary arteries to suggest pulmonary emboli. Cardiomegaly. Diffuse coronary artery calcifications. Scattered aortic calcifications. No aneurysm. Mediastinum/Nodes: No mediastinal, hilar, or axillary adenopathy. Trachea and esophagus are unremarkable. Thyroid unremarkable. Lungs/Pleura: Mild centrilobular emphysema. No confluent opacities or effusions. Minimal bibasilar atelectasis. Upper Abdomen: Imaging into the upper abdomen demonstrates no acute findings. Small hiatal hernia. Musculoskeletal: Chest wall soft tissues are unremarkable. No acute bony abnormality. Review of the MIP images confirms the above findings. IMPRESSION: No evidence of pulmonary embolus. Cardiomegaly, diffuse coronary artery disease. Small hiatal hernia Aortic Atherosclerosis (ICD10-I70.0) and Emphysema (ICD10-J43.9). Electronically Signed   By: KRolm BaptiseM.D.   On: 12/03/2020 00:17   DG Chest Port 1 View  Result Date: 12/02/2020 CLINICAL DATA:  Syncope EXAM: PORTABLE CHEST 1 VIEW COMPARISON:  10/30/2020 FINDINGS: Cardiomegaly, vascular congestion. Mild hyperinflation of the lungs. No confluent opacities or effusions. No acute bony abnormality. IMPRESSION: Hyperinflation. Cardiomegaly, vascular congestion. Electronically Signed   By: KRolm BaptiseM.D.   On: 12/02/2020 19:32   VAS  [place]OWER EXTREMITY VENOUS (DVT) (ONLY [facility] & [facility])  Result Date: 12/02/2020  Lower Venous DVT Study Indications: Edema, SOB, and syncope.  Risk Factors: CKD. Comparison Study: Prior negative bilateral lower extremity venous duplex was                   done 09/16/2014 Performing Technologist: CSharion DoveRVS  Examination  Guidelines: A complete evaluation includes B-mode imaging, spectral Doppler, color Doppler, and power Doppler as needed of all accessible portions of each vessel. Bilateral testing is considered an integral part of a complete examination. Limited examinations for reoccurring indications may be performed as noted. The reflux portion of the exam is performed with the patient in reverse Trendelenburg.  +---------+---------------+---------+-----------+----------+--------------+ RIGHT    CompressibilityPhasicitySpontaneityPropertiesThrombus Aging +---------+---------------+---------+-----------+----------+--------------+ CFV      Full           Yes      Yes                                 +---------+---------------+---------+-----------+----------+--------------+ SFJ      Full                                                        +---------+---------------+---------+-----------+----------+--------------+ FV Prox  Full                                                        +---------+---------------+---------+-----------+----------+--------------+ FV Mid   Full                                                        +---------+---------------+---------+-----------+----------+--------------+ FV DistalFull                                                        +---------+---------------+---------+-----------+----------+--------------+ PFV      Full                                                        +---------+---------------+---------+-----------+----------+--------------+ POP      Full           Yes      Yes                                  +---------+---------------+---------+-----------+----------+--------------+ PTV      Full                                                        +---------+---------------+---------+-----------+----------+--------------+ PERO     Full                                                        +---------+---------------+---------+-----------+----------+--------------+   +---------+---------------+---------+-----------+----------+--------------+  LEFT     CompressibilityPhasicitySpontaneityPropertiesThrombus Aging +---------+---------------+---------+-----------+----------+--------------+ CFV      Full           Yes      Yes                                 +---------+---------------+---------+-----------+----------+--------------+ SFJ      Full                                                        +---------+---------------+---------+-----------+----------+--------------+ FV Prox  Full                                                        +---------+---------------+---------+-----------+----------+--------------+ FV Mid   Full                                                        +---------+---------------+---------+-----------+----------+--------------+ FV DistalFull                                                        +---------+---------------+---------+-----------+----------+--------------+ PFV      Full                                                        +---------+---------------+---------+-----------+----------+--------------+ POP      Full           Yes      Yes                                 +---------+---------------+---------+-----------+----------+--------------+ PTV      Full                                                        +---------+---------------+---------+-----------+----------+--------------+ PERO     Full                                                         +---------+---------------+---------+-----------+----------+--------------+     Summary: BILATERAL: - No evidence of deep vein thrombosis seen in the lower extremities, bilaterally. - RIGHT: interstitial edema noted throughout  LEFT: Interstitial edema noted throughout.  *See table(s) above for measurements and observations.    Preliminary  EKG: Independently reviewed.  Normal sinus rhythm with RBBB and APCs.  Assessment/Plan Principal Problem:   Acute on chronic diastolic CHF (congestive heart failure) ([facility]) Active Problems:   COPD (chronic obstructive pulmonary disease) ([facility])   Essential hypertension   CHF (congestive heart failure) ([facility])   Syncope and collapse   Acute CHF (congestive heart failure) ([place])    1. Acute on chronic diastolic CHF with history of pulmonary hypertension last EF measured in February 2022 2 months ago was 55 to 60%.  Patient was recently switched to torsemide and his dose was decreased by half last week.  Patient states he may have gained at least 2 pounds over the last few days.  Has bilateral lower extremity edema at this time and clinical signs of CHF for which we have placed patient on Lasix 40 mg IV every 12 closely follow intake output metabolic panel daily weights. 2. Syncope cause not clear.  May be related to hypoxia.  We will continue to monitor in telemetry to rule out any arrhythmias and trend cardiac markers.  3. Elevated troponin with CT angiogram showing coronary arteries atherosclerosis.  Denies any chest pain will trend cardiac markers will notify cardiology. 4. Hypertension on ramipril.  Note that patient has chronic liver disease. 5. Chronic kidney disease stage III creatinine appears to be at baseline. 6. COPD not actively wheezing. 7. Thrombocytopenia -has had thrombocytopenia in 2019.  We will closely monitor platelets. 8. BPH continue Cardura.   DVT prophylaxis: Heparin. Code Status: Full code. Family Communication: Discussed with  patient. Disposition Plan: Home. Consults called: Cardiology. Admission status: Observation.   Rise Patience MD [facility] Pager [phone].  If 7PM-7AM, please contact night-coverage [email or web address] Password [facility]  12/03/2020, 4:56 AM

## 2020-12-03 NOTE — Progress Notes (Signed)
Pt has orders to be discharged. Discharge instructions given and pt has no additional questions at this time. Medication regimen reviewed and pt educated. Pt verbalized understanding and has no additional questions. Telemetry box removed. IV removed and site in good condition. Pt stable and waiting for transportation.

## 2020-12-06 ENCOUNTER — Other Ambulatory Visit: Payer: Medicare Other

## 2020-12-07 ENCOUNTER — Other Ambulatory Visit: Payer: Medicare Other

## 2020-12-07 ENCOUNTER — Ambulatory Visit (INDEPENDENT_AMBULATORY_CARE_PROVIDER_SITE_OTHER): Payer: Medicare Other | Admitting: Internal Medicine

## 2020-12-07 ENCOUNTER — Other Ambulatory Visit: Payer: Self-pay

## 2020-12-07 ENCOUNTER — Encounter: Payer: Self-pay | Admitting: Internal Medicine

## 2020-12-07 VITALS — BP 84/57 | HR 83 | Ht 75.0 in | Wt 235.6 lb

## 2020-12-07 DIAGNOSIS — Z86711 Personal history of pulmonary embolism: Secondary | ICD-10-CM | POA: Diagnosis not present

## 2020-12-07 DIAGNOSIS — I700 Atherosclerosis of aorta: Secondary | ICD-10-CM

## 2020-12-07 DIAGNOSIS — I5032 Chronic diastolic (congestive) heart failure: Secondary | ICD-10-CM | POA: Diagnosis not present

## 2020-12-07 DIAGNOSIS — G4733 Obstructive sleep apnea (adult) (pediatric): Secondary | ICD-10-CM | POA: Diagnosis not present

## 2020-12-07 DIAGNOSIS — I10 Essential (primary) hypertension: Secondary | ICD-10-CM | POA: Diagnosis not present

## 2020-12-07 DIAGNOSIS — I50812 Chronic right heart failure: Secondary | ICD-10-CM | POA: Diagnosis not present

## 2020-12-07 DIAGNOSIS — I272 Pulmonary hypertension, unspecified: Secondary | ICD-10-CM | POA: Diagnosis not present

## 2020-12-07 LAB — BASIC METABOLIC PANEL
BUN/Creatinine Ratio: 19 (ref 10–24)
BUN: 31 mg/dL — ABNORMAL HIGH (ref 8–27)
CO2: 27 mmol/L (ref 20–29)
Calcium: 9.3 mg/dL (ref 8.6–10.2)
Chloride: 96 mmol/L (ref 96–106)
Creatinine, Ser: 1.67 mg/dL — ABNORMAL HIGH (ref 0.76–1.27)
Glucose: 87 mg/dL (ref 65–99)
Potassium: 4.8 mmol/L (ref 3.5–5.2)
Sodium: 137 mmol/L (ref 134–144)
eGFR: 40 mL/min/{1.73_m2} — ABNORMAL LOW (ref >59)

## 2020-12-07 LAB — CBC
Hematocrit: 48.7 % (ref 37.5–51.0)
Hemoglobin: 15.4 g/dL (ref 13.0–17.7)
MCH: 27.9 pg (ref 26.6–33.0)
MCHC: 31.6 g/dL (ref 31.5–35.7)
MCV: 88 fL (ref 79–97)
Platelets: 191 10*3/uL (ref 150–450)
RBC: 5.51 x10E6/uL (ref 4.14–5.80)
RDW: 14 % (ref 11.6–15.4)
WBC: 4.2 10*3/uL (ref 3.4–10.8)

## 2020-12-07 LAB — MAGNESIUM: Magnesium: 1.6 mg/dL (ref 1.6–2.3)

## 2020-12-07 NOTE — Addendum Note (Signed)
Addended by: Precious Gilding on: 12/07/2020 12:56 PM   Modules accepted: Orders

## 2020-12-07 NOTE — H&P (View-Only) (Signed)
Cardiology Office Note:    Date:  12/07/2020   ID:  Joshua [name], DOB 04/26/1936, MRN [id number]  PCP:  Kim, James, MD   [facility]  Cardiologist:  Michael Cooper, MD  Advanced Practice Provider:  No care team member to display Electrophysiologist:  None       CC: Follow up syncope  History of Present Illness:    Joshua [name] is a 85 y.o. 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 From me seeing him 12/03/20:  "Notes that he was walking up the stairs and have a feeling of light headedness.  This has happened before back in 2018.  No syncope with walking, but felt dizzy.  Attempt to walk quickly so he could get to sit down.  Things went dark and he then passed out.  Describes a prior event as near syncope when going up the stairs. Has had no chest pain, chest pressure, chest tightness, chest stinging.  Feeling of dizziness does not happen during activity, but right after activity, usually going up the stairs..  No shortness of breath, DOE.  No PND or orthopnea.  No bendopnea, weight gain (notes possible 1-2 lbs at most), leg swelling , or abdominal swelling.  Notes  no palpitations or funny heart. " Through this we DC'ed his Coreg.  Telemetry was unremarkable.  Patient notes that he is doing OK.  Since last visit notes that he is still having some dizziness. Stops and takes it easy and with that feels better.  Doesn't remember ever taking the COREG.  Still taking ramipril and torsemide.  No chest pain or pressure .  No SOB/DOE and no PND/Orthopnea.  No weight gain or leg swelling.  No palpitation.  Ambulatory blood pressure not done.   Past Medical History:  Diagnosis Date  . Arthritis   . BPH (benign prostatic hyperplasia)   . COPD (chronic obstructive pulmonary disease) (HCC)   . Folliculitis    posterior scalp per office visit note  of Dr Kim 07/20/2014    . GERD (gastroesophageal reflux disease)   . Hypertension   . PE (pulmonary thromboembolism) (HCC)   . Phlebitis    right arm  at least 20 years ago   . Pneumonia    hx of pneumonia as a child   . Sleep apnea     Past Surgical History:  Procedure Laterality Date  . BIOPSY  11/03/2020   Procedure: BIOPSY;  Surgeon: Hung, Patrick, MD;  Location: [facility];  Service: Endoscopy;;  . bone removed from little toe right foot     . CHOLECYSTECTOMY    . ESOPHAGOGASTRODUODENOSCOPY (EGD) WITH PROPOFOL N/A 11/03/2020   Procedure: ESOPHAGOGASTRODUODENOSCOPY (EGD) WITH PROPOFOL;  Surgeon: Hung, Patrick, MD;  Location: [facility];  Service: Endoscopy;  Laterality: N/A;  . pilonidal cyst removal     . TOTAL KNEE ARTHROPLASTY Left 09/13/2014   Procedure: LEFT TOTAL KNEE ARTHROPLASTY;  Surgeon: Matthew D Olin, MD;  Location: [facility];  Service: Orthopedics;  Laterality: Left;    Current Medications: Current Meds  Medication Sig  . allopurinol (ZYLOPRIM) 100 MG tablet Take 100 mg by mouth every morning.  . aspirin EC 81 MG tablet Take 81 mg by mouth every morning. Swallow whole.  . Cinnamon 500 MG capsule Take 500 mg by mouth every morning.  . doxazosin (CARDURA)   4 MG tablet TAKE 1 TABLET BY MOUTH EVERY DAY  . doxycycline (VIBRA-TABS) 100 MG tablet Take 100 mg by mouth 2 (two) times daily as needed (skin knot on back of neck).  . finasteride (PROSCAR) 5 MG tablet Take 5 mg by mouth at bedtime.  . gabapentin (NEURONTIN) 100 MG capsule Take 100 mg by mouth 3 (three) times daily.  . ibuprofen (ADVIL) 200 MG tablet Take 400 mg by mouth daily as needed for headache (pain).  . magnesium oxide (MAG-OX) 400 MG tablet Take 400 mg by mouth every morning.  . pantoprazole (PROTONIX) 40 MG tablet Take 40 mg by mouth every morning.  . potassium chloride (KLOR-CON) 10 MEQ tablet Take 1 tablet (10 mEq total) by mouth daily.  . ramipril (ALTACE) 2.5 MG capsule Take 2.5 mg by mouth every  morning.  . SPIRIVA RESPIMAT 1.25 MCG/ACT AERS INHALE 2 PUFFS BY MOUTH INTO THE LUNGS DAILY  . torsemide (DEMADEX) 20 MG tablet Take one tablet by mouth ( 20 mg) twice daily. Take one extra tablet ( 20 mg ) for extra 3 lbs in one day.     Allergies:   Other, Sunflower oil, and Sulfa antibiotics   Social History   Socioeconomic History  . Marital status: Married    Spouse name: Not on file  . Number of children: Not on file  . Years of education: Not on file  . Highest education level: Not on file  Occupational History  . Not on file  Tobacco Use  . Smoking status: Former Smoker    Packs/day: 1.00    Years: 50.00    Pack years: 50.00    Types: Cigarettes    Quit date: 08/19/2006    Years since quitting: 14.3  . Smokeless tobacco: Never Used  Vaping Use  . Vaping Use: Never used  Substance and Sexual Activity  . Alcohol use: Yes    Alcohol/week: 6.0 standard drinks    Types: 6 Shots of liquor per week  . Drug use: No  . Sexual activity: Not on file  Other Topics Concern  . Not on file  Social History Narrative  . Not on file   Social Determinants of Health   Financial Resource Strain: Not on file  Food Insecurity: Not on file  Transportation Needs: Not on file  Physical Activity: Not on file  Stress: Not on file  Social Connections: Not on file     Family History: The patient's family history includes Heart attack (age of onset: 57) in his brother.  ROS:   Please see the history of present illness.     All other systems reviewed and are negative.  EKGs/Labs/Other Studies Reviewed:    The following studies were reviewed today:  EKG:   12/07/20:  SR RBBB PAC rate 69  Relevant CV Studies:  Transthoracic Echocardiogram: Date: 09/29/20 Results: Aortic Root ULN for age and BSA- 38 mm, RV severely dilated, with decrease function 1. Left ventricular ejection fraction, by estimation, is 55 to 60%. The  left ventricle has normal function. Left ventricular  endocardial border  not optimally defined to evaluate regional wall motion. There is mild  concentric left ventricular  hypertrophy. Left ventricular diastolic function could not be evaluated.  There is the interventricular septum is flattened in systole and diastole,  consistent with right ventricular pressure and volume overload.  2. Right ventricular systolic function is severely reduced. The right  ventricular size is severely enlarged. There is moderately elevated  pulmonary artery   systolic pressure. The estimated right ventricular  systolic pressure is 89.1 mmHg.  3. Right atrial size was severely dilated.  4. The mitral valve is normal in structure. No evidence of mitral valve  regurgitation. No evidence of mitral stenosis.  5. Tricuspid valve regurgitation is mild to moderate.  6. The aortic valve is tricuspid. Aortic valve regurgitation is trivial.  Mild to moderate aortic valve sclerosis/calcification is present, without  any evidence of aortic stenosis.  7. Aortic dilatation noted. There is mild dilatation of the aortic root,  measuring 40 mm.  8. The inferior vena cava is normal in size with greater than 50%  respiratory variability, suggesting right atrial pressure of 3 mmHg.  9. There is left bowing of the interatrial septum, suggestive of elevated  right atrial pressure. No atrial level shunt detected by color flow  Doppler.   NonCardiac CT : Date: 11/28/20 Results: Aortic Atherosclerosis Main PA dilation 37 mm LAD and RCA calcifications: R dom vs Co dom RV Dilation RA Dilation  CS Dilation 12 mm   NM Stress Testing : Date: 09/24/2017 Results:no perfusion defect  Nuclear stress EF: 54% with overall normal but asynchronous contraction, interventricular conduction delay (right bundle branch block at baseline).  There was no ST segment deviation noted during stress.  This is a low risk study. No ischemia identified.    Recent Labs: 12/02/2020: B  Natriuretic Peptide 963.5 12/03/2020: ALT 16; BUN 26; Creatinine, Ser 1.35; Hemoglobin 14.8; Magnesium 1.7; Platelets 148; Potassium 3.8; Sodium 139  Recent Lipid Panel No results found for: CHOL, TRIG, HDL, CHOLHDL, VLDL, LDLCALC, LDLDIRECT   Risk Assessment/Calculations:     N/A  Physical Exam:    VS:  BP (!) 84/57   Pulse 83   Ht _0  (1.905 m)   Wt 235 lb 9.6 oz (106.9 kg)   SpO2 95%   BMI 29.45 kg/m     Wt Readings from Last 3 Encounters:  12/07/20 235 lb 9.6 oz (106.9 kg)  12/03/20 232 lb 14.4 oz (105.6 kg)  11/14/20 232 lb (105.2 kg)     Orthostatic Vitals: Supine:  BP 87/55  HR 75 Sitting:  BP 78//52  HR 81 Standing:  BP 78/53  HR 76 Prolonged Stand:  BP 83/55  HR 79  GEN:  Well nourished, well developed in no acute distress HEENT: Normal NECK: No JVD; No carotid bruits LYMPHATICS: No lymphadenopathy CARDIAC: RRR, no murmurs, rubs, gallops RESPIRATORY:  Clear to auscultation without rales, wheezing or rhonchi  ABDOMEN: Soft, non-tender, non-distended MUSCULOSKELETAL:  No edema; No deformity  SKIN: Warm and dry NEUROLOGIC:  Alert and oriented x 3 PSYCHIATRIC:  Normal affect   ASSESSMENT:    No diagnosis found. PLAN:    In order of problems listed above:  Pulmonary Hypertension (Mixed phenotype Group II-III suspected) OSA- had sleep study 2019  COPD History of PE HFpEF Aortic Atherosclerosis PACs - Presenting with hypotension - NYHA class I, Stage B, euvolemic, etiology from HTN suspected - given syncope and PH with question of volume statu (hypovolemic? Will get RHC - - Only if he has CTEPH would management possibly change (possible Riociguat) from [facility] clarification but if filling pressures are low we could back off on torsemide - holding Ramipril will get BMP in one-two weeks - working to get him back with sleep study assessment - reaching out to primary cardiologist - in future visits can re-address LDL  STOPBANG: 7 (scanned and in  chart)  Shared Decision Making/Informed Consent The risks [  stroke (1 in 1000), death (1 in 1000), kidney failure [usually temporary] (1 in 500), bleeding (1 in 200), allergic reaction [possibly serious] (1 in 200)], benefits (diagnostic support and management of coronary artery disease) and alternatives of a cardiac catheterization were discussed in detail with Mr. Manor and he is willing to proceed.   Post cath follow up with primary cardiologist based on results unless new symptoms or abnormal test results warranting change in plan  Would be reasonable for  APP Follow up  Time Spent Directly with Patient:   I have spent a total of  40 minutes with the patient reviewing notes, imaging, EKGs, labs and examining the patient as well as establishing an assessment and plan that was discussed personally with the patient.  > 50% of time was spent in direct patient care and family.   Medication Adjustments/Labs and Tests Ordered: Current medicines are reviewed at length with the patient today.  Concerns regarding medicines are outlined above.  No orders of the defined types were placed in this encounter.  No orders of the defined types were placed in this encounter.   There are no Patient Instructions on file for this visit.   Signed, Garfield Coiner A Trenten Watchman, MD  12/07/2020 9:49 AM    [facility]  

## 2020-12-07 NOTE — Patient Instructions (Addendum)
Medication Instructions:  Your physician recommends that you continue on your current medications as directed. Please refer to the Current Medication list given to you today.  *If you need a refill on your cardiac medications before your next appointment, please call your pharmacy*   Lab Work: TODAY: CBC and BMET If you have labs (blood work) drawn today and your tests are completely normal, you will receive your results only by: Marland Kitchen MyChart Message (if you have MyChart) OR . A paper copy in the mail If you have any lab test that is abnormal or we need to change your treatment, we will call you to review the results.   Testing/Procedures: Your physician has requested that you have a cardiac catheterization. Cardiac catheterization is used to diagnose and/or treat various heart conditions. Doctors may recommend this procedure for a number of different reasons. The most common reason is to evaluate chest pain. Chest pain can be a symptom of coronary artery disease (CAD), and cardiac catheterization can show whether plaque is narrowing or blocking your heart's arteries. This procedure is also used to evaluate the valves, as well as measure the blood flow and oxygen levels in different parts of your heart. Please follow instruction sheet, as given.  Due to recent COVID-19 restrictions implemented by our local and state authorities and in an effort to keep both patients and staff as safe as possible, our hospital system requires COVID-19 testing prior to certain scheduled hospital procedures.  Please go to [place]. [place], [place] [address] on December 11, 2020 at 2:10 PM  .  This is a drive up testing site.  You will not need to exit your vehicle.  You will not be billed at the time of testing but may receive a bill later depending on your insurance. You must agree to self-quarantine from the time of your testing until the procedure date on December 13, 2020.  This should included staying home with  ONLY the people you live with.  Avoid take-out, grocery store shopping or leaving the house for any non-emergent reason.  Failure to have your COVID-19 test done on the date and time you have been scheduled will result in cancellation of your procedure.  Please call our office at [phone] if you have any questions.     Follow-Up: At [facility], you and your health needs are our priority.  As part of our continuing mission to provide you with exceptional heart care, we have created designated Provider Care Teams.  These Care Teams include your primary Cardiologist (physician) and Advanced Practice Providers (APPs -  Physician Assistants and Nurse Practitioners) who all work together to provide you with the care you need, when you need it.  We recommend signing up for the patient portal called "MyChart".  Sign up information is provided on this After Visit Summary.  MyChart is used to connect with patients for Virtual Visits (Telemedicine).  Patients are able to view lab/test results, encounter notes, upcoming appointments, etc.  Non-urgent messages can be sent to your provider as well.   To learn more about what you can do with MyChart, go to [email or web address].    Your next appointment:   3-4 month(s)  The format for your next appointment:   In Person  Provider:   You may see Sherren Mocha, MD or one of the following Advanced Practice Providers on your designated Care Team:    Richardson Dopp, PA-C  Robbie Lis, [place]    Other Instructions  [place] OFFICE [place], [address] [place] [address] Dept: [phone] Loc: [place]  12/07/2020  You are scheduled for a Cardiac Catheterization on Wednesday, April 27 with Dr. Loralie Champagne.  1. Please arrive at the [facility] (Main Entrance A) at [facility]: [address] [place],  [address] at  8:00 AM (This time is two hours before your procedure to ensure your preparation). Free valet parking service is available.   Special note: Every effort is made to have your procedure done on time. Please understand that emergencies sometimes delay scheduled procedures.  2. Diet: Do not eat solid foods after midnight.  The patient may have clear liquids until 5am upon the day of the procedure.  3. Labs: You will need to have blood drawn TODAY 4. Medication instructions in preparation for your procedure:   **Do Not Take Torsemide on the day of procedure   Contrast Allergy: No    On the morning of your procedure, take your Aspirin 47m and any morning medicines NOT listed above.  You may use sips of water.  5. Plan for one night stay--bring personal belongings. 6. Bring a current list of your medications and current insurance cards. 7. You MUST have a responsible person to drive you home. 8. Someone MUST be with you the first 24 hours after you arrive home or your discharge will be delayed. 9. Please wear clothes that are easy to get on and off and wear slip-on shoes.  Thank you for allowing [facility] for you!   -- [facility]

## 2020-12-07 NOTE — Progress Notes (Addendum)
Cardiology Office Note:    Date:  12/07/2020   ID:  Joshua [name], DOB 07-26-1936, MRN [id number]  PCP:  Jani Gravel, [facility]  Cardiologist:  Sherren Mocha, MD  Advanced Practice Provider:  No care team member to display Electrophysiologist:  None       CC: Follow up syncope  History of Present Illness:    Joshua [name] is a 85 y.o. 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 From me seeing him 12/03/20:  "Notes that he was walking up the stairs and have a feeling of light headedness.  This has happened before back in 2018.  No syncope with walking, but felt dizzy.  Attempt to walk quickly so he could get to sit down.  Things went dark and he then passed out.  Describes a prior event as near syncope when going up the stairs. Has had no chest pain, chest pressure, chest tightness, chest stinging.  Feeling of dizziness does not happen during activity, but right after activity, usually going up the stairs..  No shortness of breath, DOE.  No PND or orthopnea.  No bendopnea, weight gain (notes possible 1-2 lbs at most), leg swelling , or abdominal swelling.  Notes  no palpitations or funny heart. " Through this we DC'ed his Coreg.  Telemetry was unremarkable.  Patient notes that he is doing OK.  Since last visit notes that he is still having some dizziness. Stops and takes it easy and with that feels better.  Doesn't remember ever taking the COREG.  Still taking ramipril and torsemide.  No chest pain or pressure .  No SOB/DOE and no PND/Orthopnea.  No weight gain or leg swelling.  No palpitation.  Ambulatory blood pressure not done.   Past Medical History:  Diagnosis Date  . Arthritis   . BPH (benign prostatic hyperplasia)   . COPD (chronic obstructive pulmonary disease) ([place])   . Folliculitis    posterior scalp per office visit note  of Dr Maudie Mercury 07/20/2014    . GERD (gastroesophageal reflux disease)   . Hypertension   . PE (pulmonary thromboembolism) ([place])   . Phlebitis    right arm  at least 20 years ago   . Pneumonia    hx of pneumonia as a child   . Sleep apnea     Past Surgical History:  Procedure Laterality Date  . BIOPSY  11/03/2020   Procedure: BIOPSY;  Surgeon: Carol Ada, MD;  Location: [facility];  Service: Endoscopy;;  . bone removed from little toe right foot     . CHOLECYSTECTOMY    . ESOPHAGOGASTRODUODENOSCOPY (EGD) WITH PROPOFOL N/A 11/03/2020   Procedure: ESOPHAGOGASTRODUODENOSCOPY (EGD) WITH PROPOFOL;  Surgeon: Carol Ada, MD;  Location: [facility];  Service: Endoscopy;  Laterality: N/A;  . pilonidal cyst removal     . TOTAL KNEE ARTHROPLASTY Left 09/13/2014   Procedure: LEFT TOTAL KNEE ARTHROPLASTY;  Surgeon: Mauri Pole, MD;  Location: [facility];  Service: Orthopedics;  Laterality: Left;    Current Medications: Current Meds  Medication Sig  . allopurinol (ZYLOPRIM) 100 MG tablet Take 100 mg by mouth every morning.  Marland Kitchen aspirin EC 81 MG tablet Take 81 mg by mouth every morning. Swallow whole.  . Cinnamon 500 MG capsule Take 500 mg by mouth every morning.  Marland Kitchen doxazosin (CARDURA)  4 MG tablet TAKE 1 TABLET BY MOUTH EVERY DAY  . doxycycline (VIBRA-TABS) 100 MG tablet Take 100 mg by mouth 2 (two) times daily as needed (skin knot on back of neck).  . finasteride (PROSCAR) 5 MG tablet Take 5 mg by mouth at bedtime.  . gabapentin (NEURONTIN) 100 MG capsule Take 100 mg by mouth 3 (three) times daily.  Marland Kitchen ibuprofen (ADVIL) 200 MG tablet Take 400 mg by mouth daily as needed for headache (pain).  . magnesium oxide (MAG-OX) 400 MG tablet Take 400 mg by mouth every morning.  . pantoprazole (PROTONIX) 40 MG tablet Take 40 mg by mouth every morning.  . potassium chloride (KLOR-CON) 10 MEQ tablet Take 1 tablet (10 mEq total) by mouth daily.  . ramipril (ALTACE) 2.5 MG capsule Take 2.5 mg by mouth every  morning.  Marland Kitchen SPIRIVA RESPIMAT 1.25 MCG/ACT AERS INHALE 2 PUFFS BY MOUTH INTO THE LUNGS DAILY  . torsemide (DEMADEX) 20 MG tablet Take one tablet by mouth ( 20 mg) twice daily. Take one extra tablet ( 20 mg ) for extra 3 lbs in one day.     Allergies:   Other, Sunflower oil, and Sulfa antibiotics   Social History   Socioeconomic History  . Marital status: Married    Spouse name: Not on file  . Number of children: Not on file  . Years of education: Not on file  . Highest education level: Not on file  Occupational History  . Not on file  Tobacco Use  . Smoking status: Former Smoker    Packs/day: 1.00    Years: 50.00    Pack years: 50.00    Types: Cigarettes    Quit date: 08/19/2006    Years since quitting: 14.3  . Smokeless tobacco: Never Used  Vaping Use  . Vaping Use: Never used  Substance and Sexual Activity  . Alcohol use: Yes    Alcohol/week: 6.0 standard drinks    Types: 6 Shots of liquor per week  . Drug use: No  . Sexual activity: Not on file  Other Topics Concern  . Not on file  Social History Narrative  . Not on file   Social Determinants of Health   Financial Resource Strain: Not on file  Food Insecurity: Not on file  Transportation Needs: Not on file  Physical Activity: Not on file  Stress: Not on file  Social Connections: Not on file     Family History: The patient's family history includes Heart attack (age of onset: 51) in his brother.  ROS:   Please see the history of present illness.     All other systems reviewed and are negative.  EKGs/Labs/Other Studies Reviewed:    The following studies were reviewed today:  EKG:   12/07/20:  SR RBBB PAC rate 69  Relevant CV Studies:  Transthoracic Echocardiogram: Date: 09/29/20 Results: Aortic Root ULN for age and BSA- 38 mm, RV severely dilated, with decrease function 1. Left ventricular ejection fraction, by estimation, is 55 to 60%. The  left ventricle has normal function. Left ventricular  endocardial border  not optimally defined to evaluate regional wall motion. There is mild  concentric left ventricular  hypertrophy. Left ventricular diastolic function could not be evaluated.  There is the interventricular septum is flattened in systole and diastole,  consistent with right ventricular pressure and volume overload.  2. Right ventricular systolic function is severely reduced. The right  ventricular size is severely enlarged. There is moderately elevated  pulmonary artery  systolic pressure. The estimated right ventricular  systolic pressure is 89.1 mmHg.  3. Right atrial size was severely dilated.  4. The mitral valve is normal in structure. No evidence of mitral valve  regurgitation. No evidence of mitral stenosis.  5. Tricuspid valve regurgitation is mild to moderate.  6. The aortic valve is tricuspid. Aortic valve regurgitation is trivial.  Mild to moderate aortic valve sclerosis/calcification is present, without  any evidence of aortic stenosis.  7. Aortic dilatation noted. There is mild dilatation of the aortic root,  measuring 40 mm.  8. The inferior vena cava is normal in size with greater than 50%  respiratory variability, suggesting right atrial pressure of 3 mmHg.  9. There is left bowing of the interatrial septum, suggestive of elevated  right atrial pressure. No atrial level shunt detected by color flow  Doppler.   NonCardiac CT : Date: 11/28/20 Results: Aortic Atherosclerosis Main PA dilation 37 mm LAD and RCA calcifications: R dom vs Co dom RV Dilation RA Dilation  CS Dilation 12 mm   NM Stress Testing : Date: 09/24/2017 Results:no perfusion defect  Nuclear stress EF: 54% with overall normal but asynchronous contraction, interventricular conduction delay (right bundle branch block at baseline).  There was no ST segment deviation noted during stress.  This is a low risk study. No ischemia identified.    Recent Labs: 12/02/2020: B  Natriuretic Peptide 963.5 12/03/2020: ALT 16; BUN 26; Creatinine, Ser 1.35; Hemoglobin 14.8; Magnesium 1.7; Platelets 148; Potassium 3.8; Sodium 139  Recent Lipid Panel No results found for: CHOL, TRIG, HDL, CHOLHDL, VLDL, LDLCALC, LDLDIRECT   Risk Assessment/Calculations:     N/A  Physical Exam:    VS:  BP (!) 84/57   Pulse 83   Ht _0  (1.905 m)   Wt 235 lb 9.6 oz (106.9 kg)   SpO2 95%   BMI 29.45 kg/m     Wt Readings from Last 3 Encounters:  12/07/20 235 lb 9.6 oz (106.9 kg)  12/03/20 232 lb 14.4 oz (105.6 kg)  11/14/20 232 lb (105.2 kg)     Orthostatic Vitals: Supine:  BP 87/55  HR 75 Sitting:  BP 78//52  HR 81 Standing:  BP 78/53  HR 76 Prolonged Stand:  BP 83/55  HR 79  GEN:  Well nourished, well developed in no acute distress HEENT: Normal NECK: No JVD; No carotid bruits LYMPHATICS: No lymphadenopathy CARDIAC: RRR, no murmurs, rubs, gallops RESPIRATORY:  Clear to auscultation without rales, wheezing or rhonchi  ABDOMEN: Soft, non-tender, non-distended MUSCULOSKELETAL:  No edema; No deformity  SKIN: Warm and dry NEUROLOGIC:  Alert and oriented x 3 PSYCHIATRIC:  Normal affect   ASSESSMENT:    No diagnosis found. PLAN:    In order of problems listed above:  Pulmonary Hypertension (Mixed phenotype Group II-III suspected) OSA- had sleep study 2019  COPD History of PE HFpEF Aortic Atherosclerosis PACs - Presenting with hypotension - NYHA class I, Stage B, euvolemic, etiology from HTN suspected - given syncope and PH with question of volume statu (hypovolemic? Will get RHC - - Only if he has CTEPH would management possibly change (possible [place]at) from [facility] clarification but if filling pressures are low we could back off on torsemide - holding Ramipril will get BMP in one-two weeks - working to get him back with sleep study assessment - reaching out to primary cardiologist - in future visits can re-address LDL  STOPBANG: 7 (scanned and in  chart)  Shared Decision Making/Informed Consent The risks [  stroke (1 in 1000), death (1 in 66), kidney failure [usually temporary] (1 in 500), bleeding (1 in 200), allergic reaction [possibly serious] (1 in 200)], benefits (diagnostic support and management of coronary artery disease) and alternatives of a cardiac catheterization were discussed in detail with Mr. Hearty and he is willing to proceed.   Post cath follow up with primary cardiologist based on results unless new symptoms or abnormal test results warranting change in plan  Would be reasonable for  APP Follow up  Time Spent Directly with Patient:   I have spent a total of  40 minutes with the patient reviewing notes, imaging, EKGs, labs and examining the patient as well as establishing an assessment and plan that was discussed personally with the patient.  > 50% of time was spent in direct patient care and family.   Medication Adjustments/Labs and Tests Ordered: Current medicines are reviewed at length with the patient today.  Concerns regarding medicines are outlined above.  No orders of the defined types were placed in this encounter.  No orders of the defined types were placed in this encounter.   There are no Patient Instructions on file for this visit.   Signed, Werner Lean, MD  12/07/2020 9:49 AM    [place]

## 2020-12-11 ENCOUNTER — Other Ambulatory Visit: Payer: Medicare Other

## 2020-12-11 ENCOUNTER — Other Ambulatory Visit (HOSPITAL_COMMUNITY)
Admission: RE | Admit: 2020-12-11 | Discharge: 2020-12-11 | Disposition: A | Discharge status: Home / Self Care | Payer: Medicare Other | Source: Ambulatory Visit | Attending: Cardiology | Admitting: Cardiology

## 2020-12-11 DIAGNOSIS — Z01812 Encounter for preprocedural laboratory examination: Secondary | ICD-10-CM | POA: Insufficient documentation

## 2020-12-11 DIAGNOSIS — Z20822 Contact with and (suspected) exposure to covid-19: Secondary | ICD-10-CM | POA: Insufficient documentation

## 2020-12-12 ENCOUNTER — Telehealth: Payer: Self-pay | Admitting: *Deleted

## 2020-12-12 LAB — SARS CORONAVIRUS 2 (TAT 6-24 HRS): SARS Coronavirus 2: NEGATIVE

## 2020-12-12 NOTE — Telephone Encounter (Signed)
Pt contacted pre-right heart catheterization scheduled at [facility] for: Wednesday December 13, 2020 10 AM Verified arrival time and place: [facility]) at: 8 AM   No solid food after midnight prior to cath, clear liquids until 5 AM day of procedure.  Hold: Torsemide/KCl-AM of procedure   Except hold medications AM meds can be  taken pre-cath with sips of water.   Confirmed patient has responsible adult to drive home post procedure and be with patient first 24 hours after arriving home: yes  You are allowed ONE visitor in the waiting room during the time you are at the hospital for your procedure. Both you and your visitor must wear a mask once you enter the hospital.  Reviewed procedure/mask/visitor instructions with patient.

## 2020-12-13 ENCOUNTER — Encounter (HOSPITAL_COMMUNITY): Payer: Self-pay | Admitting: Cardiology

## 2020-12-13 ENCOUNTER — Ambulatory Visit (HOSPITAL_COMMUNITY)
Admission: RE | Admit: 2020-12-13 | Discharge: 2020-12-13 | Disposition: A | Discharge status: Home / Self Care | Payer: Medicare Other | Attending: Cardiology | Admitting: Cardiology

## 2020-12-13 ENCOUNTER — Encounter (HOSPITAL_COMMUNITY)
Admission: RE | Disposition: A | Discharge status: Home / Self Care | Payer: Self-pay | Source: Home / Self Care | Attending: Cardiology

## 2020-12-13 DIAGNOSIS — Z882 Allergy status to sulfonamides status: Secondary | ICD-10-CM | POA: Insufficient documentation

## 2020-12-13 DIAGNOSIS — G4733 Obstructive sleep apnea (adult) (pediatric): Secondary | ICD-10-CM | POA: Diagnosis not present

## 2020-12-13 DIAGNOSIS — Z7982 Long term (current) use of aspirin: Secondary | ICD-10-CM | POA: Diagnosis not present

## 2020-12-13 DIAGNOSIS — I700 Atherosclerosis of aorta: Secondary | ICD-10-CM | POA: Diagnosis not present

## 2020-12-13 DIAGNOSIS — I2721 Secondary pulmonary arterial hypertension: Secondary | ICD-10-CM | POA: Diagnosis not present

## 2020-12-13 DIAGNOSIS — I509 Heart failure, unspecified: Secondary | ICD-10-CM | POA: Insufficient documentation

## 2020-12-13 DIAGNOSIS — Z87891 Personal history of nicotine dependence: Secondary | ICD-10-CM | POA: Insufficient documentation

## 2020-12-13 DIAGNOSIS — I110 Hypertensive heart disease with heart failure: Secondary | ICD-10-CM | POA: Diagnosis not present

## 2020-12-13 DIAGNOSIS — Z79899 Other long term (current) drug therapy: Secondary | ICD-10-CM | POA: Insufficient documentation

## 2020-12-13 DIAGNOSIS — I272 Pulmonary hypertension, unspecified: Secondary | ICD-10-CM

## 2020-12-13 DIAGNOSIS — J449 Chronic obstructive pulmonary disease, unspecified: Secondary | ICD-10-CM | POA: Diagnosis not present

## 2020-12-13 HISTORY — PX: RIGHT HEART CATH: CATH118263

## 2020-12-13 LAB — POCT I-STAT EG7
Acid-Base Excess: 7 mmol/L — ABNORMAL HIGH (ref 0.0–2.0)
Acid-Base Excess: 7 mmol/L — ABNORMAL HIGH (ref 0.0–2.0)
Bicarbonate: 32.7 mmol/L — ABNORMAL HIGH (ref 20.0–28.0)
Bicarbonate: 32.8 mmol/L — ABNORMAL HIGH (ref 20.0–28.0)
Calcium, Ion: 1.21 mmol/L (ref 1.15–1.40)
Calcium, Ion: 1.24 mmol/L (ref 1.15–1.40)
HCT: 49 % (ref 39.0–52.0)
HCT: 49 % (ref 39.0–52.0)
Hemoglobin: 16.7 g/dL (ref 13.0–17.0)
Hemoglobin: 16.7 g/dL (ref 13.0–17.0)
O2 Saturation: 67 %
O2 Saturation: 69 %
Potassium: 3.8 mmol/L (ref 3.5–5.1)
Potassium: 3.9 mmol/L (ref 3.5–5.1)
Sodium: 138 mmol/L (ref 135–145)
Sodium: 139 mmol/L (ref 135–145)
TCO2: 34 mmol/L — ABNORMAL HIGH (ref 22–32)
TCO2: 34 mmol/L — ABNORMAL HIGH (ref 22–32)
pCO2, Ven: 50.4 mmHg (ref 44.0–60.0)
pCO2, Ven: 50.7 mmHg (ref 44.0–60.0)
pH, Ven: 7.418 (ref 7.250–7.430)
pH, Ven: 7.42 (ref 7.250–7.430)
pO2, Ven: 35 mmHg (ref 32.0–45.0)
pO2, Ven: 36 mmHg (ref 32.0–45.0)

## 2020-12-13 SURGERY — RIGHT HEART CATH
Anesthesia: LOCAL

## 2020-12-13 MED ORDER — SODIUM CHLORIDE 0.9% FLUSH: 3.0000 mL | Freq: Two times a day (BID) | INTRAVENOUS | Status: DC

## 2020-12-13 MED ORDER — SODIUM CHLORIDE 0.9% FLUSH: 3.0000 mL | INTRAVENOUS | Status: DC | PRN

## 2020-12-13 MED ORDER — SODIUM CHLORIDE 0.9 % IV SOLN: 250.0000 mL | INTRAVENOUS | Status: DC | PRN

## 2020-12-13 MED ORDER — SODIUM CHLORIDE 0.9 % IV SOLN: INTRAVENOUS | Status: DC

## 2020-12-13 MED ORDER — ACETAMINOPHEN 325 MG PO TABS: 650.0000 mg | ORAL_TABLET | ORAL | Status: DC | PRN

## 2020-12-13 MED ORDER — HEPARIN (PORCINE) IN NACL 1000-0.9 UT/500ML-% IV SOLN
INTRAVENOUS | Status: AC
  Filled 2020-12-13: qty 500

## 2020-12-13 MED ORDER — LABETALOL HCL 5 MG/ML IV SOLN: 10.0000 mg | INTRAVENOUS | Status: DC | PRN

## 2020-12-13 MED ORDER — LIDOCAINE HCL (PF) 1 % IJ SOLN
INTRAMUSCULAR | Status: DC | PRN
  Administered 2020-12-13: 2 mL

## 2020-12-13 MED ORDER — HYDRALAZINE HCL 20 MG/ML IJ SOLN: 10.0000 mg | INTRAMUSCULAR | Status: DC | PRN

## 2020-12-13 MED ORDER — LIDOCAINE HCL (PF) 1 % IJ SOLN
INTRAMUSCULAR | Status: AC
  Filled 2020-12-13: qty 30

## 2020-12-13 MED ORDER — ONDANSETRON HCL 4 MG/2ML IJ SOLN
4.0000 mg | Freq: Four times a day (QID) | INTRAMUSCULAR | Status: DC | PRN
Start: 2020-12-13 — End: 2020-12-13

## 2020-12-13 MED ORDER — ASPIRIN 81 MG PO CHEW: 81.0000 mg | CHEWABLE_TABLET | ORAL | Status: DC

## 2020-12-13 MED ORDER — HEPARIN (PORCINE) IN NACL 1000-0.9 UT/500ML-% IV SOLN
INTRAVENOUS | Status: DC | PRN
  Administered 2020-12-13: 500 mL

## 2020-12-13 SURGICAL SUPPLY — 8 items
CATH SWAN GANZ 7F STRAIGHT (CATHETERS) ×1 IMPLANT
GLIDESHEATH SLENDER 7FR .021G (SHEATH) ×1 IMPLANT
KIT HEART LEFT (KITS) ×2 IMPLANT
PACK CARDIAC CATHETERIZATION (CUSTOM PROCEDURE TRAY) ×2 IMPLANT
PROTECTION STATION PRESSURIZED (MISCELLANEOUS) ×2
STATION PROTECTION PRESSURIZED (MISCELLANEOUS) IMPLANT
TRANSDUCER W/STOPCOCK (MISCELLANEOUS) ×2 IMPLANT
WIRE EMERALD 3MM-J .025X260CM (WIRE) ×1 IMPLANT

## 2020-12-13 NOTE — Interval H&P Note (Signed)
History and Physical Interval Note:  12/13/2020 10:08 AM  Joshua [name]  has presented today for surgery, with the diagnosis of CHF.  The various methods of treatment have been discussed with the patient and family. After consideration of risks, benefits and other options for treatment, the patient has consented to  Procedure(s): RIGHT HEART CATH (N/A) as a surgical intervention.  The patient's history has been reviewed, patient examined, no change in status, stable for surgery.  I have reviewed the patient's chart and labs.  Questions were answered to the patient's satisfaction.     Meleane Selinger [employer]

## 2020-12-13 NOTE — Discharge Instructions (Signed)
Venogram A venogram, or venography, is a procedure that uses an X-ray and dye (contrast) to examine how well the veins work and how blood flows through them. Contrast helps the veins show up on X-rays. A venogram may be done:  To evaluate abnormalities in the vein.  To identify clots within veins, such as deep vein thrombosis (DVT).  To map out the veins that might be needed for another procedure. Tell a health care provider about:  Any allergies you have, especially to medicines, shellfish, iodine, and contrast.  All medicines you are taking, including vitamins, herbs, eye drops, creams, and over-the-counter medicines.  Any problems you or family members have had with anesthetic medicines.  Any blood disorders you have.  Any surgeries you have had and any complications that occurred.  Any medical conditions you have.  Whether you are pregnant, may be pregnant, or are breastfeeding.  Any history of smoking or tobacco use. What are the risks? Generally, this is a safe procedure. However, problems may occur, including:  Infection.  Bleeding.  Blood clots.  Allergic reaction to medicines or contrast.  Damage to other structures or organs.  Kidney problems.  Increased risk of cancer. Being exposed to too much radiation over a lifetime can increase the risk of cancer. The risk is small. What happens before the procedure? Medicines Ask your health care provider about:  Changing or stopping your regular medicines. This is especially important if you are taking diabetes medicines or blood thinners.  Taking medicines such as aspirin and ibuprofen. These medicines can thin your blood. Do not take these medicines unless your health care provider tells you to take them.  Taking over-the-counter medicines, vitamins, herbs, and supplements. General instructions  Follow instructions from your health care provider about eating or drinking restrictions.  You may have blood tests  to check how well your kidneys and liver are working and how well your blood can clot.  Plan to have someone take you home from the hospital or clinic. What happens during the procedure?  An IV will be inserted into one of your veins.  You may be given a medicine to help you relax (sedative).  You will lie down on an X-ray table. The table may be tilted in different directions during the procedure to help the contrast move throughout your body. Safety straps will keep you secure if the table is tilted.  If veins in your arm or leg will be examined, a band may be wrapped around that arm or leg to keep the veins full of blood. This may cause your arm or leg to feel numb.  The contrast will be injected into your IV. You may have a hot, flushed feeling as it moves throughout your body. You may also have a metallic taste in your mouth. Both of these sensations will go away after the test is complete.  You may be asked to lie in different positions or place your legs or arms in different positions.  At the end of the procedure, you may be given IV fluids to help wash or flush the contrast out of your veins.  The IV will be removed, and pressure will be applied to the IV site to prevent bleeding. A bandage (dressing) may be applied to the IV site. The exact procedure may vary among health care providers and hospitals.   What can I expect after the procedure?  Your blood pressure, heart rate, breathing rate, and blood oxygen level will be monitored until   you leave the hospital or clinic.  You may be given something to eat and drink.  You may have bruising or mild discomfort in the area where the IV was inserted. Follow these instructions at home: Eating and drinking  Follow instructions from your health care provider about eating or drinking restrictions.  Drink a lot of water for the first several days after the procedure, as directed by your health care provider. This helps to flush the  contrast out of your body.   Activity  Rest as told by your health care provider.  Return to your normal activities as told by your health care provider. Ask your health care provider what activities are safe for you.  If you were given a sedative during your procedure, do not drive for 24 hours or until your health care provider approves. General instructions  Check your IV insertion area every day for signs of infection. Check for: ? Redness, swelling, or pain. ? Fluid or blood. ? Warmth. ? Pus or a bad smell.  Take over-the-counter and prescription medicines only as told by your health care provider.  Keep all follow-up visits as told by your health care provider. This is important. Contact a health care provider if:  Your skin becomes itchy or you develop a rash or hives.  You have a fever that does not get better with medicine.  You feel nauseous or you vomit.  You have redness, swelling, or pain around the insertion site.  You have fluid or blood coming from the insertion site.  Your insertion area feels warm to the touch.  You have pus or a bad smell coming from the insertion site. Get help right away if you:  Have shortness of breath or difficulty breathing.  Develop chest pain.  Faint.  Feel very dizzy. These symptoms may represent a serious problem that is an emergency. Do not wait to see if the symptoms will go away. Get medical help right away. Call your local emergency services (911 in the [place] Do not drive yourself to the hospital. Summary  A venogram, or venography, is a procedure that uses an X-ray and contrast dye to check how well the veins work and how blood flows through them.  An IV will be inserted into one of your veins in order to inject the contrast.  During the exam, you will lie on an X-ray table. The table may be tilted in different directions during the procedure to help the contrast move throughout your body. Safety straps will keep  you secure.  After the procedure, you will need to drink a lot of water to help wash or flush the contrast out of your body. This information is not intended to replace advice given to you by your health care provider. Make sure you discuss any questions you have with your health care provider. Document Revised: 03/13/2019 Document Reviewed: 03/13/2019 Elsevier Patient Education  [employer].

## 2020-12-18 ENCOUNTER — Telehealth: Payer: Self-pay

## 2020-12-18 DIAGNOSIS — I272 Pulmonary hypertension, unspecified: Secondary | ICD-10-CM

## 2020-12-18 DIAGNOSIS — Z86711 Personal history of pulmonary embolism: Secondary | ICD-10-CM

## 2020-12-18 NOTE — Telephone Encounter (Signed)
-----  Message from Werner Lean, MD sent at 12/14/2020 12:41 PM EDT ----- Not sure if Kirk Ruths Cc'ed you on the [place] Given his syncope, prior Significant PE, and combo PH, V/Q would be reasonable.  Wanted to close the loop, Mahesh ----- Message ----- From: Larey Dresser, MD Sent: 12/13/2020  10:46 AM EDT To: Jani Gravel, MD, Werner Lean, MD

## 2020-12-18 NOTE — Telephone Encounter (Signed)
Per Dr. Burt Knack, will order VQ scan for PHTN.

## 2020-12-20 DIAGNOSIS — R7303 Prediabetes: Secondary | ICD-10-CM | POA: Diagnosis not present

## 2020-12-20 DIAGNOSIS — M109 Gout, unspecified: Secondary | ICD-10-CM | POA: Diagnosis not present

## 2020-12-20 DIAGNOSIS — E7800 Pure hypercholesterolemia, unspecified: Secondary | ICD-10-CM | POA: Diagnosis not present

## 2020-12-20 DIAGNOSIS — R7989 Other specified abnormal findings of blood chemistry: Secondary | ICD-10-CM | POA: Diagnosis not present

## 2020-12-20 DIAGNOSIS — I10 Essential (primary) hypertension: Secondary | ICD-10-CM | POA: Diagnosis not present

## 2020-12-21 NOTE — Telephone Encounter (Signed)
VQ scan and CXR ordered for scheduling. The patient agrees with treatment plan.

## 2020-12-27 DIAGNOSIS — E119 Type 2 diabetes mellitus without complications: Secondary | ICD-10-CM | POA: Diagnosis not present

## 2020-12-27 DIAGNOSIS — I272 Pulmonary hypertension, unspecified: Secondary | ICD-10-CM | POA: Diagnosis not present

## 2020-12-27 DIAGNOSIS — I809 Phlebitis and thrombophlebitis of unspecified site: Secondary | ICD-10-CM | POA: Diagnosis not present

## 2020-12-27 DIAGNOSIS — Z0000 Encounter for general adult medical examination without abnormal findings: Secondary | ICD-10-CM | POA: Diagnosis not present

## 2020-12-27 DIAGNOSIS — K219 Gastro-esophageal reflux disease without esophagitis: Secondary | ICD-10-CM | POA: Diagnosis not present

## 2020-12-27 DIAGNOSIS — Z09 Encounter for follow-up examination after completed treatment for conditions other than malignant neoplasm: Secondary | ICD-10-CM | POA: Diagnosis not present

## 2020-12-27 DIAGNOSIS — I5032 Chronic diastolic (congestive) heart failure: Secondary | ICD-10-CM | POA: Diagnosis not present

## 2020-12-27 DIAGNOSIS — M109 Gout, unspecified: Secondary | ICD-10-CM | POA: Diagnosis not present

## 2020-12-27 DIAGNOSIS — H6990 Unspecified Eustachian tube disorder, unspecified ear: Secondary | ICD-10-CM | POA: Diagnosis not present

## 2020-12-27 DIAGNOSIS — K449 Diaphragmatic hernia without obstruction or gangrene: Secondary | ICD-10-CM | POA: Diagnosis not present

## 2020-12-27 DIAGNOSIS — J449 Chronic obstructive pulmonary disease, unspecified: Secondary | ICD-10-CM | POA: Diagnosis not present

## 2020-12-27 DIAGNOSIS — N179 Acute kidney failure, unspecified: Secondary | ICD-10-CM | POA: Diagnosis not present

## 2020-12-29 NOTE — Telephone Encounter (Signed)
VQ scan has been scheduled 01/03/2021.

## 2021-01-01 DIAGNOSIS — R066 Hiccough: Secondary | ICD-10-CM | POA: Diagnosis not present

## 2021-01-03 ENCOUNTER — Other Ambulatory Visit: Payer: Self-pay

## 2021-01-03 ENCOUNTER — Telehealth: Payer: Self-pay | Admitting: Cardiovascular Disease

## 2021-01-03 ENCOUNTER — Ambulatory Visit (HOSPITAL_COMMUNITY)
Admission: RE | Admit: 2021-01-03 | Discharge: 2021-01-03 | Disposition: A | Discharge status: Home / Self Care | Payer: Medicare Other | Source: Ambulatory Visit | Attending: Cardiovascular Disease | Admitting: Cardiovascular Disease

## 2021-01-03 DIAGNOSIS — I272 Pulmonary hypertension, unspecified: Secondary | ICD-10-CM | POA: Diagnosis not present

## 2021-01-03 DIAGNOSIS — Z86711 Personal history of pulmonary embolism: Secondary | ICD-10-CM | POA: Insufficient documentation

## 2021-01-03 DIAGNOSIS — I10 Essential (primary) hypertension: Secondary | ICD-10-CM | POA: Diagnosis not present

## 2021-01-03 DIAGNOSIS — J432 Centrilobular emphysema: Secondary | ICD-10-CM | POA: Diagnosis not present

## 2021-01-03 DIAGNOSIS — I270 Primary pulmonary hypertension: Secondary | ICD-10-CM | POA: Diagnosis not present

## 2021-01-03 MED ORDER — TECHNETIUM TO 99M ALBUMIN AGGREGATED
4.2000 | Freq: Once | INTRAVENOUS | Status: AC | PRN
  Administered 2021-01-03: 4.2 via INTRAVENOUS

## 2021-01-03 MED ORDER — APIXABAN 5 MG PO TABS: 5.0000 mg | ORAL_TABLET | Freq: Two times a day (BID) | ORAL | 11 refills | Status: DC

## 2021-01-03 NOTE — Telephone Encounter (Signed)
Spoke with imaging center and they called to inform of of emboli noted on VQ scan.  Spoke with Dr. Gasper Sells, who seen pt as DOD most recently.  He said to have pt discontinue ASA and start Eliquis.  Start dose at 56m BID x 7 days and then decrease to 513mBID.

## 2021-01-03 NOTE — Telephone Encounter (Signed)
Reviewed results with patient who verbalized understanding.    Instructed the patient to STOP ASPIRIN. He understands to take Eliquis 10 mg BID for 7 days then decrease dose to Eliquis 5 mg BID. Samples placed at front desk.

## 2021-01-03 NOTE — Telephone Encounter (Signed)
Diane from [facility] calling with a stat report of a nuclear med pulmonary study.

## 2021-01-03 NOTE — Telephone Encounter (Signed)
thanks

## 2021-01-23 ENCOUNTER — Other Ambulatory Visit: Payer: Self-pay

## 2021-01-23 ENCOUNTER — Ambulatory Visit (HOSPITAL_COMMUNITY)
Admission: RE | Admit: 2021-01-23 | Discharge: 2021-01-23 | Disposition: A | Discharge status: Home / Self Care | Payer: Medicare Other | Source: Ambulatory Visit | Attending: Cardiology | Admitting: Cardiology

## 2021-01-23 ENCOUNTER — Telehealth (HOSPITAL_COMMUNITY): Payer: Self-pay | Admitting: Pharmacy Technician

## 2021-01-23 ENCOUNTER — Encounter (HOSPITAL_COMMUNITY): Payer: Self-pay | Admitting: Cardiology

## 2021-01-23 ENCOUNTER — Other Ambulatory Visit (HOSPITAL_COMMUNITY): Payer: Self-pay

## 2021-01-23 VITALS — BP 100/70 | HR 93 | Wt 229.6 lb

## 2021-01-23 DIAGNOSIS — R55 Syncope and collapse: Secondary | ICD-10-CM | POA: Diagnosis not present

## 2021-01-23 DIAGNOSIS — I10 Essential (primary) hypertension: Secondary | ICD-10-CM | POA: Diagnosis not present

## 2021-01-23 DIAGNOSIS — N401 Enlarged prostate with lower urinary tract symptoms: Secondary | ICD-10-CM

## 2021-01-23 DIAGNOSIS — I5032 Chronic diastolic (congestive) heart failure: Secondary | ICD-10-CM | POA: Insufficient documentation

## 2021-01-23 DIAGNOSIS — N138 Other obstructive and reflux uropathy: Secondary | ICD-10-CM | POA: Diagnosis not present

## 2021-01-23 DIAGNOSIS — Z7951 Long term (current) use of inhaled steroids: Secondary | ICD-10-CM | POA: Insufficient documentation

## 2021-01-23 DIAGNOSIS — I110 Hypertensive heart disease with heart failure: Secondary | ICD-10-CM | POA: Diagnosis not present

## 2021-01-23 DIAGNOSIS — I50812 Chronic right heart failure: Secondary | ICD-10-CM

## 2021-01-23 DIAGNOSIS — I2724 Chronic thromboembolic pulmonary hypertension: Secondary | ICD-10-CM | POA: Insufficient documentation

## 2021-01-23 DIAGNOSIS — N400 Enlarged prostate without lower urinary tract symptoms: Secondary | ICD-10-CM | POA: Insufficient documentation

## 2021-01-23 DIAGNOSIS — Z86711 Personal history of pulmonary embolism: Secondary | ICD-10-CM

## 2021-01-23 DIAGNOSIS — J439 Emphysema, unspecified: Secondary | ICD-10-CM | POA: Diagnosis not present

## 2021-01-23 DIAGNOSIS — Z8249 Family history of ischemic heart disease and other diseases of the circulatory system: Secondary | ICD-10-CM | POA: Insufficient documentation

## 2021-01-23 DIAGNOSIS — R0902 Hypoxemia: Secondary | ICD-10-CM | POA: Diagnosis not present

## 2021-01-23 DIAGNOSIS — Z79899 Other long term (current) drug therapy: Secondary | ICD-10-CM | POA: Diagnosis not present

## 2021-01-23 DIAGNOSIS — G4733 Obstructive sleep apnea (adult) (pediatric): Secondary | ICD-10-CM | POA: Diagnosis not present

## 2021-01-23 DIAGNOSIS — I700 Atherosclerosis of aorta: Secondary | ICD-10-CM | POA: Diagnosis not present

## 2021-01-23 DIAGNOSIS — I248 Other forms of acute ischemic heart disease: Secondary | ICD-10-CM

## 2021-01-23 DIAGNOSIS — Z87891 Personal history of nicotine dependence: Secondary | ICD-10-CM | POA: Insufficient documentation

## 2021-01-23 DIAGNOSIS — I951 Orthostatic hypotension: Secondary | ICD-10-CM | POA: Insufficient documentation

## 2021-01-23 DIAGNOSIS — I272 Pulmonary hypertension, unspecified: Secondary | ICD-10-CM

## 2021-01-23 DIAGNOSIS — Z7901 Long term (current) use of anticoagulants: Secondary | ICD-10-CM | POA: Diagnosis not present

## 2021-01-23 DIAGNOSIS — I2489 Other forms of acute ischemic heart disease: Secondary | ICD-10-CM

## 2021-01-23 LAB — BASIC METABOLIC PANEL
Anion gap: 8 (ref 5–15)
BUN: 22 mg/dL (ref 8–23)
CO2: 36 mmol/L — ABNORMAL HIGH (ref 22–32)
Calcium: 9.3 mg/dL (ref 8.9–10.3)
Chloride: 95 mmol/L — ABNORMAL LOW (ref 98–111)
Creatinine, Ser: 1.67 mg/dL — ABNORMAL HIGH (ref 0.61–1.24)
GFR, Estimated: 40 mL/min — ABNORMAL LOW (ref >60)
Glucose, Bld: 88 mg/dL (ref 70–99)
Potassium: 3.8 mmol/L (ref 3.5–5.1)
Sodium: 139 mmol/L (ref 135–145)

## 2021-01-23 LAB — BRAIN NATRIURETIC PEPTIDE: B Natriuretic Peptide: 1230.9 pg/mL — ABNORMAL HIGH (ref 0.0–100.0)

## 2021-01-23 LAB — HIV ANTIBODY (ROUTINE TESTING W REFLEX): HIV Screen 4th Generation wRfx: NONREACTIVE

## 2021-01-23 MED ORDER — POTASSIUM CHLORIDE ER 10 MEQ PO TBCR: 20.0000 meq | EXTENDED_RELEASE_TABLET | Freq: Every day | ORAL | 3 refills | Status: DC

## 2021-01-23 MED ORDER — TORSEMIDE 20 MG PO TABS: 40.0000 mg | ORAL_TABLET | Freq: Two times a day (BID) | ORAL | 11 refills | Status: DC

## 2021-01-23 NOTE — Progress Notes (Signed)
PCP: [name], DO Cardiology: Dr. Burt Knack HF Cardiology: Dr. Aundra Dubin  85 y.o. with history of HFPEF, prior PE, and pulmonary hypertension was referred by Dr. Gasper Sells for evaluation of pulmonary hypertension.  Patient had a PE in 2016.  He is on apixaban. He has a long history of diastolic CHF.  Most recent echo showed a significant component of RV failure with EF 55-60%, IV septum flattened, severe RV enlargement, severely decreased RV function, PASP 57 mmHg.  CTA chest in 4/22 showed no acute PE and mild emphysema, but V/Q scan in 5/22 was suggestive of chronic PE in the right middle lobe.  RHC in 4/22 showed normal filling pressures with moderate PAH. He additionally has chronic hiccups followed by Dr. Benson Norway, now on gabapentin.   Currently, patient reports short of breath walking up stairs or hills.  No dyspnea walking on flat ground.  No orthopnea/PND.  No chest pain.  Occasional lightheadedness if he stands too fast, he had an episode of syncope in 4/22 that was thought to be orthostatic.  SBP 100 today.  Oxygen saturation dropped to 84% today with ambulation.  Still with hiccups.   ECG (4/22, personally reviewed): NSR, RBBB  Labs (4/22): K 4.8, creatinine 1.67  PMH: 1. HFPEF: With prominent RV failure.   - Echo (2/22): EF 55-60%, IV septum flattened, severe RV enlargement, severely decreased RV function, PASP 57 mmHg.  2. Venous thromboembolic disease: PE in 5093.   - Venous dopplers (4/22): No DVT.  - CTA chest (4/22): No PE.  - V/Q scan 5/22 with perfusion defect in the RML consistent with chronic PE.  3. OSA: Does not tolerate CPAP.  4. HTN 5. COPD: Prior smoker.   - CTA chest in 4/22 showed no PE, mild emphysema.  6. Pulmonary hypertension: RHC (4/22) with mean RA 5, PA 65/19 mean 36, mean PCWP 5, CI 2.19, PVR 6.1 WU, PAPi 9.2.  7. Chronic hiccups 8. BPH: Has to in and out cath at times.   Social History   Socioeconomic History  . Marital status: Married    Spouse  name: Not on file  . Number of children: Not on file  . Years of education: Not on file  . Highest education level: Not on file  Occupational History  . Not on file  Tobacco Use  . Smoking status: Former Smoker    Packs/day: 1.00    Years: 50.00    Pack years: 50.00    Types: Cigarettes    Quit date: 08/19/2006    Years since quitting: 14.4  . Smokeless tobacco: Never Used  Vaping Use  . Vaping Use: Never used  Substance and Sexual Activity  . Alcohol use: Yes    Alcohol/week: 6.0 standard drinks    Types: 6 Shots of liquor per week  . Drug use: No  . Sexual activity: Not on file  Other Topics Concern  . Not on file  Social History Narrative  . Not on file   Social Determinants of Health   Financial Resource Strain: Not on file  Food Insecurity: Not on file  Transportation Needs: Not on file  Physical Activity: Not on file  Stress: Not on file  Social Connections: Not on file  Intimate Partner Violence: Not on file   Family History  Problem Relation Age of Onset  . Heart attack Brother 57   ROS: All systems reviewed and negative except as per HPI.   Current Outpatient Medications  Medication Sig Dispense Refill  .  allopurinol (ZYLOPRIM) 100 MG tablet Take 100 mg by mouth every morning.    Marland Kitchen apixaban (ELIQUIS) 5 MG TABS tablet Take 1 tablet (5 mg total) by mouth 2 (two) times daily. 60 tablet 11  . azelastine (ASTELIN) 0.1 % nasal spray Place 2 sprays into both nostrils daily. Use in each nostril as directed    . Cinnamon 500 MG capsule Take 500 mg by mouth every morning.    Marland Kitchen doxazosin (CARDURA) 4 MG tablet TAKE 1 TABLET BY MOUTH EVERY DAY 90 tablet 3  . doxycycline (VIBRA-TABS) 100 MG tablet Take 100 mg by mouth 2 (two) times daily as needed (skin knot on back of neck).    . finasteride (PROSCAR) 5 MG tablet Take 5 mg by mouth at bedtime.    . gabapentin (NEURONTIN) 100 MG capsule Take 100 mg by mouth 3 (three) times daily.    Marland Kitchen ibuprofen (ADVIL) 200 MG tablet  Take 400 mg by mouth daily as needed for headache (pain).    . magnesium oxide (MAG-OX) 400 MG tablet Take 400 mg by mouth every morning.    . pantoprazole (PROTONIX) 40 MG tablet Take 40 mg by mouth every morning.    Marland Kitchen SPIRIVA RESPIMAT 1.25 MCG/ACT AERS INHALE 2 PUFFS BY MOUTH INTO THE LUNGS DAILY 4 g 5  . potassium chloride (KLOR-CON) 10 MEQ tablet Take 2 tablets (20 mEq total) by mouth daily. 180 tablet 3  . torsemide (DEMADEX) 20 MG tablet Take 2 tablets (40 mg total) by mouth 2 (two) times daily. Take one extra tablet ( 20 mg ) for extra 3 lbs in one day. 120 tablet 11   No current facility-administered medications for this encounter.   BP 100/70   Pulse 93   Wt 104.1 kg (229 lb 9.6 oz)   SpO2 (!) 84%   BMI 28.70 kg/m  General: NAD Neck: JVP 9-10, no thyromegaly or thyroid nodule.  Lungs: Mildly decreased BS bilaterally.  CV: Nondisplaced PMI.  Heart regular S1/S2, no S3/S4, no murmur.  1+ edema 3/4 to knees bilaterally.  No carotid bruit.  Normal pedal pulses.  Abdomen: Soft, nontender, no hepatosplenomegaly, no distention.  Skin: Intact without lesions or rashes.  Neurologic: Alert and oriented x 3.  Psych: Normal affect. Extremities: No clubbing or cyanosis.  HEENT: Normal.   Assessment/Plan: 1. HFPEF/RV failure: Echo (2/22) with EF 55-60%, IV septum flattened, severe RV enlargement, severely decreased RV function, PASP 57 mmHg.  RHC in 4/22 showed normal filling presures, but today, he is volume overloaded on exam.  NYHA class III symptoms.  - Increase torsemide to 40 mg bid and increase KCl to 20 daily.  - BMET/BNP today, BMET 10 days.  2. Pulmonary hypertension: PAH noted on 4/22 RHC with PVR 6.1 WU.  This appears to be multifactorial with OSA, emphysema, hypoxemia with ambulation, and a suspected chronic PE involving the right middle lobe (group 3 and group 4 PH). Given the suspected mixed etiology with only 1 area of chronic thromboembolism (right middle lobe) as well as  age, do not think that pulmonary thromboendarterectomy would be indicated.  Could consider balloon pulmonary angioplasty down the road.  Needs to complete the [place] workup. Oxygen saturation low with ambulation.  - CTA chest showed mild emphysema, will arrange for PFTs to see how severe obstruction is.  - Send serologic workup with HIV, RF, ANA, anti-SCL70 Ab, anti-centromere Ab.  - Needs home oxygen with exertion, will arrange (portable).  - Start riociguat for CTEPH.  3. OSA: Unable to tolerate CPAP, untreated.  4. Emphysema: Prior smoker.  "Mild" emphysema on CT.   - Arrange for PFTs to assess.  5. Orthostatic symptoms: Improved recently.  He is off ramipril.  I think he needs doxazosin for his prostate (severe BPH) but would consider transition to Flomax.   Loralie Champagne 01/23/2021

## 2021-01-23 NOTE — Telephone Encounter (Signed)
Advanced Heart Failure Patient Advocate Encounter  Patient was seen in clinic today and started on Adempas. Will fax in paperwork and work on Print production planner approval for the new start.

## 2021-01-23 NOTE — Patient Instructions (Addendum)
Labs done today. We will contact you only if your labs are abnormal.  INCREASE Torsemide to 69m (2 tablets) by mouth 2 times daily.  INCREASE Potassium to 249m (2 tablets) by mouth daily.   Our Pharmacy tech will be in contact with you about starting Ademaps.   No other medication changes were made. Please continue all current medications as prescribed.  Your physician has recommended that you have a pulmonary function test. Pulmonary Function Tests are a group of tests that measure how well air moves in and out of your lungs. Please refer to the handout given to you today during your office visit.   Your physician recommends that you schedule a follow-up appointment in:10 days for a lab only app 1 month with Dr. McAundra DubinIf you have any questions or concerns before your next appointment please send usK[place] message through [facility] call our office at [phone]   TO LEAVE A MESSAGE FOR THE NURSE SELECT OPTION 2, PLEASE LEAVE A MESSAGE INCLUDING: . YOUR NAME . DATE OF BIRTH . CALL BACK NUMBER . REASON FOR CALL**this is important as we prioritize the call backs  YOU WILL RECEIVE A CALL BACK THE SAME DAY AS LONG AS YOU CALL BEFORE 4:00 PM   Do the following things EVERYDAY: 1) Weigh yourself in the morning before breakfast. Write it down and keep it in a log. 2) Take your medicines as prescribed 3) Eat low salt foods--Limit salt (sodium) to 2000 mg per day.  4) Stay as active as you can everyday 5) Limit all fluids for the day to less than 2 liters   At the [facility] and your health needs are our priority. As part of our continuing mission to provide you with exceptional heart care, we have created designated Provider Care Teams. These Care Teams include your primary Cardiologist (physician) and Advanced Practice Providers (APPs- Physician Assistants and Nurse Practitioners) who all work together to provide you with the care you need, when you need it.    You may see any of the following providers on your designated Care Team at your next follow up: . Marland Kitchenr DaGlori Bickers Dr DaLoralie Champagne AmDarrick GrinderNP . BrLyda JesterPA . LaAudry RilesPharmD   Please be sure to bring in all your medications bottles to every appointment.

## 2021-01-24 LAB — RHEUMATOID FACTOR: Rheumatoid fact SerPl-aCnc: 39.6 IU/mL — ABNORMAL HIGH (ref <14.0)

## 2021-01-24 LAB — ANTI-SCLERODERMA ANTIBODY: Scleroderma (Scl-70) (ENA) Antibody, IgG: <0.2 AI (ref 0.0–0.9)

## 2021-01-24 LAB — ANA: Anti Nuclear Antibody (ANA): NEGATIVE

## 2021-01-25 NOTE — Addendum Note (Signed)
Encounter addended by: Scarlette Calico, RN on: 01/25/2021 5:37 PM  Actions taken: Order list changed, Diagnosis association updated

## 2021-01-26 ENCOUNTER — Other Ambulatory Visit (HOSPITAL_COMMUNITY): Payer: Self-pay

## 2021-01-26 ENCOUNTER — Other Ambulatory Visit (HOSPITAL_COMMUNITY)
Admission: RE | Admit: 2021-01-26 | Discharge: 2021-01-26 | Disposition: A | Discharge status: Home / Self Care | Payer: Medicare Other | Source: Ambulatory Visit | Attending: Cardiology | Admitting: Cardiology

## 2021-01-26 DIAGNOSIS — Z20822 Contact with and (suspected) exposure to covid-19: Secondary | ICD-10-CM | POA: Diagnosis not present

## 2021-01-26 DIAGNOSIS — Z01812 Encounter for preprocedural laboratory examination: Secondary | ICD-10-CM | POA: Insufficient documentation

## 2021-01-26 LAB — SARS CORONAVIRUS 2 (TAT 6-24 HRS): SARS Coronavirus 2: NEGATIVE

## 2021-01-26 MED ORDER — ADEMPAS 1 MG PO TABS
1.0000 mg | ORAL_TABLET | Freq: Three times a day (TID) | ORAL | 11 refills | Status: DC
Start: 2021-01-26 — End: 2021-02-21

## 2021-01-26 NOTE — Progress Notes (Signed)
Meds ordered this encounter  Medications   Riociguat (ADEMPAS) 1 MG TABS    Sig: Take 1 mg by mouth 3 (three) times daily.    Dispense:  90 tablet    Refill:  11   Per Dr. Aundra Dubin patient needs to start Adempas 69m po tid.

## 2021-01-29 ENCOUNTER — Other Ambulatory Visit: Payer: Self-pay

## 2021-01-29 ENCOUNTER — Ambulatory Visit (HOSPITAL_COMMUNITY)
Admission: RE | Admit: 2021-01-29 | Discharge: 2021-01-29 | Disposition: A | Discharge status: Home / Self Care | Payer: Medicare Other | Source: Ambulatory Visit | Attending: Cardiology | Admitting: Cardiology

## 2021-01-29 DIAGNOSIS — I272 Pulmonary hypertension, unspecified: Secondary | ICD-10-CM | POA: Insufficient documentation

## 2021-01-29 LAB — PULMONARY FUNCTION TEST
DL/VA % pred: 62 %
DL/VA: 2.35 ml/min/mmHg/L
DLCO unc % pred: 35 %
DLCO unc: 9.97 ml/min/mmHg
FEF 25-75 Post: 0.82 L/sec
FEF 25-75 Pre: 0.66 L/sec
FEF2575-%Change-Post: 24 %
FEF2575-%Pred-Post: 33 %
FEF2575-%Pred-Pre: 27 %
FEV1-%Change-Post: 9 %
FEV1-%Pred-Post: 51 %
FEV1-%Pred-Pre: 47 %
FEV1-Post: 1.65 L
FEV1-Pre: 1.51 L
FEV1FVC-%Change-Post: 3 %
FEV1FVC-%Pred-Pre: 80 %
FEV6-%Change-Post: 9 %
FEV6-%Pred-Post: 63 %
FEV6-%Pred-Pre: 57 %
FEV6-Post: 2.63 L
FEV6-Pre: 2.4 L
FEV6FVC-%Change-Post: 1 %
FEV6FVC-%Pred-Post: 102 %
FEV6FVC-%Pred-Pre: 101 %
FVC-%Change-Post: 6 %
FVC-%Pred-Post: 61 %
FVC-%Pred-Pre: 57 %
FVC-Post: 2.69 L
FVC-Pre: 2.54 L
Post FEV1/FVC ratio: 61 %
Post FEV6/FVC ratio: 98 %
Pre FEV1/FVC ratio: 60 %
Pre FEV6/FVC Ratio: 96 %
RV % pred: 115 %
RV: 3.46 L
TLC % pred: 77 %
TLC: 6.26 L

## 2021-01-29 MED ORDER — ALBUTEROL SULFATE (2.5 MG/3ML) 0.083% IN NEBU
2.5000 mg | INHALATION_SOLUTION | Freq: Once | RESPIRATORY_TRACT | Status: AC
  Administered 2021-01-29: 2.5 mg via RESPIRATORY_TRACT

## 2021-01-30 ENCOUNTER — Ambulatory Visit (INDEPENDENT_AMBULATORY_CARE_PROVIDER_SITE_OTHER): Payer: Medicare Other | Admitting: Primary Care

## 2021-01-30 ENCOUNTER — Encounter: Payer: Self-pay | Admitting: Primary Care

## 2021-01-30 ENCOUNTER — Ambulatory Visit: Payer: Medicare Other | Admitting: Pulmonary Disease

## 2021-01-30 DIAGNOSIS — J432 Centrilobular emphysema: Secondary | ICD-10-CM | POA: Diagnosis not present

## 2021-01-30 MED ORDER — STIOLTO RESPIMAT 2.5-2.5 MCG/ACT IN AERS: 2.5000 ug | INHALATION_SPRAY | Freq: Two times a day (BID) | RESPIRATORY_TRACT | 0 refills | Status: DC

## 2021-01-30 MED ORDER — AZELASTINE HCL 0.1 % NA SOLN: 2.0000 | Freq: Every day | NASAL | 5 refills | Status: DC

## 2021-01-30 NOTE — Patient Instructions (Addendum)
Breathing study showed moderate-severe COPD, changing your inhaler to Stiolto   Recommendations: - Stop Spiriva - Start Stiolto, 2 puffs in the morning  - Start mucinex 644m (OTC) twice a day (with glass of water) - Start flonase (OTC) once daily  - Resume Astelin nasal spray 1 puff per nostril twice a day (refill sent)  Follow-up: - 4 weeks with Dr. AElsworth Sohoor BEustaquio MaizeNP

## 2021-01-30 NOTE — Progress Notes (Signed)
_0  ID: Joshua [name], male    DOB: March 03, 1936, 85 y.o.   MRN: [id number]  No chief complaint on file.   Referring provider: Jani Gravel, MD  HPI: 85 year old male, former smoker. PMH significant for COPD, pulmonary HTN, PE after knee replacement in 206, Patient of [name], last seen on 08/01/20.   He was hospitalized 08/2017 for 3 days for acute hypoxic respiratory failure, attributed to diastolic heart failure.  He was diuresed 2.5 L, had elevated troponin, follow-up stress Myoview was low risk for ischemia.   Meds- he has been tried on Breo, anoro & trelegy none of these seem to provide him any symptomatic relief -he has settled on Spiriva and seems to prefer this.  Previous [facility] pulmonary encounter: 08/01/20- [name]  Chief Complaint  Patient presents with   Follow-up    Shortness of breath when climbing stairs and hills   Breathing is worse especially when he is climbing hills or stairs, oxygen level stays between 93 to 94%. He reports mild leg swelling. 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 He gets out of breath climbing stairs and hills. Walking flat he is mostly ok. He has a chronic dry cough. He is currently having a hard time with hiccups, following with GI and neurology. He had pulmonary function testing yesterday that showed severe obstructive airways disease without BD response and severe diffusion defect. He had CTA imaging in April 2022 that showed mild centrilobular emphysema and minimal bibasilar atelectasis. Denies chest tightness, wheezing. CAT 14     Significant tests/ events reviewed Echo 12/2017 RVSP 39  NPSG 02/2018 >> no OSA, 45 mins desatn, TST 5.5 h   09/15/14 CT chest showed a large bilateral pulmonary embolism with RV  dilatation. 08/2014 Venous Doppler showed no DVT  CT angiogram 08/2017 left upper lobe nodule 9x6 mm. CT 03/2018 which showed resolution of nodules   Pulmonary function testing:  11/2014 - FEV1 59%, no BD response, ratio 70, TLC 68%, DLCO 35%   01/29/21- FEV1 51%, no BD response, ratio 61, TLC 77%, DLCO 35%    Allergies  Allergen Reactions   Other Swelling    Beer- Swelling    Sunflower Oil Swelling   Sulfa Antibiotics Rash    Immunization History  Administered Date(s) Administered   Influenza, High Dose Seasonal PF 05/27/2017   Influenza,inj,Quad PF,6+ Mos 07/19/2014, 05/29/2015, 05/07/2018   Influenza-Unspecified 05/19/2016   Pneumococcal Polysaccharide-23 12/29/2008   Tdap 08/01/2018    Past Medical History:  Diagnosis Date   Arthritis    BPH (benign prostatic hyperplasia)    COPD (chronic obstructive pulmonary disease) (HCC)    Folliculitis    posterior scalp per office visit note of Dr Maudie Mercury 07/20/2014     GERD (gastroesophageal reflux disease)    Hypertension    PE (pulmonary thromboembolism) ([place])    Phlebitis    right arm  at least 20 years ago    Pneumonia    hx of pneumonia as a child    Sleep apnea     Tobacco History: Social History   Tobacco Use  Smoking Status Former   Packs/day: 1.00   Years: 50.00   Pack years: 50.00   Types: Cigarettes   Quit date: 08/19/2006  Years since quitting: 14.4  Smokeless Tobacco Never   Counseling given: Not Answered   Outpatient Medications Prior to Visit  Medication Sig Dispense Refill   allopurinol (ZYLOPRIM) 100 MG tablet Take 100 mg by mouth every morning.     apixaban (ELIQUIS) 5 MG TABS tablet Take 1 tablet (5 mg total) by mouth 2 (two) times daily. 60 tablet 11   azelastine (ASTELIN) 0.1 % nasal spray Place 2 sprays into both nostrils daily. Use in each nostril as directed     Cinnamon 500 MG capsule Take 500 mg by mouth every morning.     doxazosin (CARDURA) 4 MG tablet TAKE 1 TABLET BY MOUTH EVERY DAY 90  tablet 3   doxycycline (VIBRA-TABS) 100 MG tablet Take 100 mg by mouth 2 (two) times daily as needed (skin knot on back of neck).     finasteride (PROSCAR) 5 MG tablet Take 5 mg by mouth at bedtime.     gabapentin (NEURONTIN) 100 MG capsule Take 100 mg by mouth 3 (three) times daily.     ibuprofen (ADVIL) 200 MG tablet Take 400 mg by mouth daily as needed for headache (pain).     magnesium oxide (MAG-OX) 400 MG tablet Take 400 mg by mouth every morning.     pantoprazole (PROTONIX) 40 MG tablet Take 40 mg by mouth every morning.     potassium chloride (KLOR-CON) 10 MEQ tablet Take 2 tablets (20 mEq total) by mouth daily. 180 tablet 3   Riociguat (ADEMPAS) 1 MG TABS Take 1 mg by mouth 3 (three) times daily. 90 tablet 11   SPIRIVA RESPIMAT 1.25 MCG/ACT AERS INHALE 2 PUFFS BY MOUTH INTO THE LUNGS DAILY 4 g 5   torsemide (DEMADEX) 20 MG tablet Take 2 tablets (40 mg total) by mouth 2 (two) times daily. Take one extra tablet ( 20 mg ) for extra 3 lbs in one day. 120 tablet 11   No facility-administered medications prior to visit.    Review of Systems  Review of Systems  Constitutional: Negative.   Respiratory:  Positive for cough. Negative for chest tightness, shortness of breath and wheezing.        Dyspnea on exertion  Musculoskeletal: Negative.   Skin: Negative.     Physical Exam  There were no vitals taken for this visit. Physical Exam Constitutional:      Appearance: Normal appearance.  HENT:     Head: Normocephalic and atraumatic.     Mouth/Throat:     Mouth: Mucous membranes are moist.     Pharynx: Oropharynx is clear.  Cardiovascular:     Rate and Rhythm: Normal rate and regular rhythm.     Comments: +2-3 BLE edema Pulmonary:     Comments: Mostly clear, faint rales bilateral bases  Musculoskeletal:        General: Normal range of motion.  Skin:    General: Skin is warm and dry.  Neurological:     General: No focal deficit present.     Mental Status: He is alert and  oriented to person, place, and time. Mental status is at baseline.     Lab Results:  CBC    Component Value Date/Time   WBC 4.2 12/07/2020 1100   WBC 5.2 12/03/2020 0638   RBC 5.51 12/07/2020 1100   RBC 5.20 12/03/2020 0638   HGB 16.7 12/13/2020 1028   HGB 15.4 12/07/2020 1100   HCT 49.0 12/13/2020 1028   HCT 48.7 12/07/2020 1100   PLT 191 12/07/2020  1100   MCV 88 12/07/2020 1100   MCH 27.9 12/07/2020 1100   MCH 28.5 12/03/2020 0638   MCHC 31.6 12/07/2020 1100   MCHC 33.0 12/03/2020 0638   RDW 14.0 12/07/2020 1100   LYMPHSABS 0.7 12/03/2020 0638   MONOABS 0.6 12/03/2020 0638   EOSABS 0.2 12/03/2020 0638   BASOSABS 0.0 12/03/2020 0638    BMET    Component Value Date/Time   NA 139 01/23/2021 1035   NA 137 12/07/2020 1100   K 3.8 01/23/2021 1035   CL 95 (L) 01/23/2021 1035   CO2 36 (H) 01/23/2021 1035   GLUCOSE 88 01/23/2021 1035   BUN 22 01/23/2021 1035   BUN 31 (H) 12/07/2020 1100   CREATININE 1.67 (H) 01/23/2021 1035   CALCIUM 9.3 01/23/2021 1035   GFRNONAA 40 (L) 01/23/2021 1035   GFRAA >60 08/01/2018 1056    BNP    Component Value Date/Time   BNP 1,230.9 (H) 01/23/2021 1035    ProBNP No results found for: PROBNP  Imaging: DG Chest 2 View  Result Date: 01/03/2021 CLINICAL DATA:  Pulmonary hypertension. History of pulmonary emboli. EXAM: CHEST - 2 VIEW COMPARISON:  Radiograph 12/02/2020 FINDINGS: Normal cardiac silhouette. Lungs are hyperinflated. Chronic bronchitic markings noted. No pleural fluid. No focal infiltrate. No pneumothorax. IMPRESSION: Hyperinflated lungs with chronic bronchitic markings. No acute findings. Electronically Signed   By: Suzy Bouchard M.D.   On: 01/03/2021 11:07   NM Pulmonary Perfusion  Result Date: 01/03/2021 CLINICAL DATA:  Pulmonary hypertension. History of pulmonary emboli. Concern for chronic pulmonary emboli. EXAM: NUCLEAR MEDICINE PERFUSION LUNG SCAN TECHNIQUE: Perfusion images were obtained in multiple projections  after intravenous injection of radiopharmaceutical. RADIOPHARMACEUTICALS:  5.0 mCi Tc-64mMAA COMPARISON:  Chest radiograph 11/03/2020, CT 12/02/2020. FINDINGS: Wedge-shaped peripheral perfusion defect in the lateral segment of the RIGHT middle lobe. This RIGHT middle lobe defect is not matched on comparison chest radiograph. Decreased perfusion to the LEFT and RIGHT upper lobe most consistent with emphysema. Centrilobular emphysema demonstrated on comparison CT. IMPRESSION: 1. Single acute or chronic pulmonary emboli within the RIGHT middle lobe. 2. Decreased perfusion upper lobes consistent with COPD. These results will be called to the ordering clinician or representative by the Radiologist Assistant, and communication documented in the PACS or [employer] Electronically Signed   By: SSuzy BouchardM.D.   On: 01/03/2021 10:54     Assessment & Plan:   No problem-specific Assessment & Plan notes found for this encounter.     EMartyn Ehrich NP 01/30/2021

## 2021-01-30 NOTE — Assessment & Plan Note (Signed)
-  Patient has exertional dyspnea and chronic cough. Associated PND/upper airway congestion. CAT score today 14 - PFTs 01/29/21 showed moderate-severe obstruction without BD response and severe diffusion defect  - Plan trial Stiolto Respimat 2 puff once daily in morning - Recommend starting Mucinex 660m BID, start Flonase nasal spray and resume Astelin nasal spray  - Follow-up 4 weeks with Dr. [facility]sworth [facility]ohoor [facility] / needs CAT score repeated

## 2021-02-01 ENCOUNTER — Other Ambulatory Visit (HOSPITAL_COMMUNITY): Payer: Self-pay

## 2021-02-01 ENCOUNTER — Telehealth (HOSPITAL_COMMUNITY): Payer: Self-pay | Admitting: Pharmacy Technician

## 2021-02-01 NOTE — Telephone Encounter (Signed)
Advanced Heart Failure Patient Advocate Encounter  Prior Authorization for Adempas has been approved.    PA#  [id number] Effective dates: 02/01/21 through 02/01/23  Sent PA approval with referral to [facility], fax [phone]  Phone number [phone]

## 2021-02-02 ENCOUNTER — Ambulatory Visit (HOSPITAL_COMMUNITY)
Admission: RE | Admit: 2021-02-02 | Discharge: 2021-02-02 | Disposition: A | Discharge status: Home / Self Care | Payer: Medicare Other | Source: Ambulatory Visit | Attending: Internal Medicine | Admitting: Internal Medicine

## 2021-02-02 ENCOUNTER — Other Ambulatory Visit: Payer: Self-pay

## 2021-02-02 DIAGNOSIS — I272 Pulmonary hypertension, unspecified: Secondary | ICD-10-CM | POA: Diagnosis not present

## 2021-02-02 LAB — BASIC METABOLIC PANEL
Anion gap: 8 (ref 5–15)
BUN: 23 mg/dL (ref 8–23)
CO2: 34 mmol/L — ABNORMAL HIGH (ref 22–32)
Calcium: 9.6 mg/dL (ref 8.9–10.3)
Chloride: 97 mmol/L — ABNORMAL LOW (ref 98–111)
Creatinine, Ser: 1.72 mg/dL — ABNORMAL HIGH (ref 0.61–1.24)
GFR, Estimated: 39 mL/min — ABNORMAL LOW (ref >60)
Glucose, Bld: 95 mg/dL (ref 70–99)
Potassium: 4.3 mmol/L (ref 3.5–5.1)
Sodium: 139 mmol/L (ref 135–145)

## 2021-02-06 LAB — CYCLIC CITRUL PEPTIDE ANTIBODY, IGG/IGA: CCP Antibodies IgG/IgA: 8 units (ref 0–19)

## 2021-02-07 ENCOUNTER — Ambulatory Visit: Payer: Medicare Other | Admitting: Physician Assistant

## 2021-02-12 NOTE — Telephone Encounter (Signed)
Advanced Heart Failure Patient Advocate Encounter  I received a call from Kathrene Alu, RN with [facility]. The patient is going out of town and would like to start [place] upon his return. He was worried about potential side effects while being away from home. Patient is set up with his first visit on July 11 to begin taking the medication.  Padgette assured me that if there were any cost issues that she would work with the patient and let the office know if there was anything to be done. Stated that at this time the patient had no concerns.  Charlann Boxer, CPhT

## 2021-02-13 ENCOUNTER — Ambulatory Visit: Payer: Medicare Other | Admitting: Physician Assistant

## 2021-02-21 ENCOUNTER — Other Ambulatory Visit (HOSPITAL_COMMUNITY): Payer: Self-pay | Admitting: *Deleted

## 2021-02-21 DIAGNOSIS — I272 Pulmonary hypertension, unspecified: Secondary | ICD-10-CM

## 2021-02-21 MED ORDER — ADEMPAS 1 MG PO TABS: 1.0000 mg | ORAL_TABLET | Freq: Three times a day (TID) | ORAL | 6 refills | Status: DC

## 2021-02-28 ENCOUNTER — Other Ambulatory Visit: Payer: Self-pay

## 2021-02-28 ENCOUNTER — Other Ambulatory Visit (HOSPITAL_COMMUNITY): Payer: Self-pay | Admitting: Cardiology

## 2021-02-28 ENCOUNTER — Ambulatory Visit (HOSPITAL_COMMUNITY)
Admission: RE | Admit: 2021-02-28 | Discharge: 2021-02-28 | Disposition: A | Discharge status: Home / Self Care | Payer: Medicare Other | Source: Ambulatory Visit | Attending: Cardiology | Admitting: Cardiology

## 2021-02-28 ENCOUNTER — Encounter (HOSPITAL_COMMUNITY): Payer: Self-pay | Admitting: Cardiology

## 2021-02-28 VITALS — BP 138/84 | HR 98 | Ht 75.0 in | Wt 234.0 lb

## 2021-02-28 DIAGNOSIS — Z8249 Family history of ischemic heart disease and other diseases of the circulatory system: Secondary | ICD-10-CM | POA: Diagnosis not present

## 2021-02-28 DIAGNOSIS — I50812 Chronic right heart failure: Secondary | ICD-10-CM | POA: Diagnosis not present

## 2021-02-28 DIAGNOSIS — I2724 Chronic thromboembolic pulmonary hypertension: Secondary | ICD-10-CM | POA: Insufficient documentation

## 2021-02-28 DIAGNOSIS — I272 Pulmonary hypertension, unspecified: Secondary | ICD-10-CM | POA: Diagnosis not present

## 2021-02-28 DIAGNOSIS — R110 Nausea: Secondary | ICD-10-CM | POA: Diagnosis not present

## 2021-02-28 DIAGNOSIS — J439 Emphysema, unspecified: Secondary | ICD-10-CM | POA: Insufficient documentation

## 2021-02-28 DIAGNOSIS — R55 Syncope and collapse: Secondary | ICD-10-CM

## 2021-02-28 DIAGNOSIS — G4733 Obstructive sleep apnea (adult) (pediatric): Secondary | ICD-10-CM | POA: Insufficient documentation

## 2021-02-28 DIAGNOSIS — Z7901 Long term (current) use of anticoagulants: Secondary | ICD-10-CM | POA: Diagnosis not present

## 2021-02-28 DIAGNOSIS — I2782 Chronic pulmonary embolism: Secondary | ICD-10-CM | POA: Insufficient documentation

## 2021-02-28 DIAGNOSIS — R42 Dizziness and giddiness: Secondary | ICD-10-CM | POA: Diagnosis not present

## 2021-02-28 DIAGNOSIS — I110 Hypertensive heart disease with heart failure: Secondary | ICD-10-CM | POA: Diagnosis not present

## 2021-02-28 DIAGNOSIS — Z79899 Other long term (current) drug therapy: Secondary | ICD-10-CM | POA: Insufficient documentation

## 2021-02-28 DIAGNOSIS — Z87891 Personal history of nicotine dependence: Secondary | ICD-10-CM | POA: Diagnosis not present

## 2021-02-28 LAB — BRAIN NATRIURETIC PEPTIDE: B Natriuretic Peptide: 1361.6 pg/mL — ABNORMAL HIGH (ref 0.0–100.0)

## 2021-02-28 LAB — BASIC METABOLIC PANEL
Anion gap: 10 (ref 5–15)
BUN: 26 mg/dL — ABNORMAL HIGH (ref 8–23)
CO2: 30 mmol/L (ref 22–32)
Calcium: 9.1 mg/dL (ref 8.9–10.3)
Chloride: 97 mmol/L — ABNORMAL LOW (ref 98–111)
Creatinine, Ser: 1.53 mg/dL — ABNORMAL HIGH (ref 0.61–1.24)
GFR, Estimated: 45 mL/min — ABNORMAL LOW (ref >60)
Glucose, Bld: 98 mg/dL (ref 70–99)
Potassium: 3.8 mmol/L (ref 3.5–5.1)
Sodium: 137 mmol/L (ref 135–145)

## 2021-02-28 MED ORDER — TAMSULOSIN HCL 0.4 MG PO CAPS
0.4000 mg | ORAL_CAPSULE | Freq: Every day | ORAL | 6 refills | Status: DC
Start: 2021-02-28 — End: 2021-04-17

## 2021-02-28 MED ORDER — POTASSIUM CHLORIDE ER 10 MEQ PO TBCR: 20.0000 meq | EXTENDED_RELEASE_TABLET | Freq: Two times a day (BID) | ORAL | 3 refills | Status: DC

## 2021-02-28 MED ORDER — TORSEMIDE 20 MG PO TABS: 60.0000 mg | ORAL_TABLET | Freq: Two times a day (BID) | ORAL | 11 refills | Status: DC

## 2021-02-28 MED ORDER — APIXABAN 5 MG PO TABS: 5.0000 mg | ORAL_TABLET | Freq: Two times a day (BID) | ORAL | Status: DC

## 2021-02-28 NOTE — Progress Notes (Signed)
SATURATION QUALIFICATIONS: (This note is used to comply with regulatory documentation for home oxygen)  Patient Saturations on Room Air at Rest = 94%  Patient Saturations on Room Air while Ambulating = 85%  Patient Saturations on 2 Liters of oxygen while Ambulating = 96%  Please briefly explain why patient needs home oxygen: pulmonary hypertension, hypoxia

## 2021-02-28 NOTE — Patient Instructions (Addendum)
Increase Torsemide to 60 mg (3 tabs) Twice daily   Increase Potassium to 20 meq (2 tabs) Twice daily   Stop Doxazosin  Start Flomax 0.4 mg Daily with evening meal  Labs done today, your results will be available in MyChart, we will contact you for abnormal readings.  Your physician recommends that you return for lab work in: 10 days  Please wear your compression hose daily, place them on as soon as you get up in the morning and remove before you go to bed at night. We have given you a prescription to take the medical supply store to get these  You have been referred to [facility] for [facility], if you have not heard from them Monday please call and let us know  Your provider has recommended that  you wear a Zio Patch for 14 days.  This monitor will record your heart rhythm for our review.  IF you have any symptoms while wearing the monitor please press the button.  If you have any issues with the patch or you notice a red or orange light on it please call the company at [phone].  Once you remove the patch please mail it back to the company as soon as possible so we can get the results.  Your physician recommends that you schedule a follow-up appointment in: 3 weeks  If you have any questions or concerns before your next appointment please send [place] a message through [place] or call our office at [phone].    TO LEAVE A MESSAGE FOR THE NURSE SELECT OPTION 2, PLEASE LEAVE A MESSAGE INCLUDING: YOUR NAME DATE OF BIRTH CALL BACK NUMBER REASON FOR CALL**this is important as we prioritize the call backs  YOU WILL RECEIVE A CALL BACK THE SAME DAY AS LONG AS YOU CALL BEFORE 4:00 PM  milAt the [facility], you and your health needs are our priority. As part of our continuing mission to provide you with exceptional heart care, we have created designated Provider Care Teams. These Care Teams include your primary Cardiologist (physician) and Advanced Practice Providers  (APPs- Physician Assistants and Nurse Practitioners) who all work together to provide you with the care you need, when you need it.   You may see any of the following providers on your designated Care Team at your next follow up: Dr Glori Bickers Dr Loralie Champagne Dr Patrice Paradise, NP Lyda Jester, [place] Ginnie Smart Audry Riles, PharmD   Please be sure to bring in all your medications bottles to every appointment.

## 2021-02-28 NOTE — Progress Notes (Signed)
6 Min Walk Test Completed  Pt ambulated 213.3 meters O2 Sat ranged 85%-92% on 2L oxygen HR ranged 81-101

## 2021-02-28 NOTE — Progress Notes (Signed)
PCP: [name], DO Cardiology: Dr. Burt Knack HF Cardiology: Dr. Aundra Dubin  85 y.o. with history of HFPEF, prior PE, and pulmonary hypertension was referred by Dr. Gasper Sells for evaluation of pulmonary hypertension.  Patient had a PE in 2016.  He is on apixaban. He has a long history of diastolic CHF.  Most recent echo showed a significant component of RV failure with EF 55-60%, IV septum flattened, severe RV enlargement, severely decreased RV function, PASP 57 mmHg.  CTA chest in 4/22 showed no acute PE and mild emphysema, but V/Q scan in 5/22 was suggestive of chronic PE in the right middle lobe.  RHC in 4/22 showed normal filling pressures with moderate PAH. He additionally has chronic hiccups followed by Dr. Benson Norway, now on gabapentin. PFTs in 6/22 showed mixed picture with severe obstruction, moderate restriction, and severely decreased DLCO.   Patient returns for followup of pulmonary hypertension and RV failure.  He is short of breath walking up stairs or walking more than about 100-200 feet.  No orthopnea/PND.  He still gets episodes of lightheadedness with standing.  He had 1 episode of presyncope after standing and 1 episode of likely passing out and falling after standing since last appointment. BP is not low today.  He has started Adempas, thinks it is making him nauseated at times. Weight is up 5 lbs. .   Labs (4/22): K 4.8, creatinine 1.67 Labs (6/22): K 3.8, creatinine 1.72, ANA negative, RF 39.6 but CCP negative, SCL-70 negative, BNP 1231, HIV negative  6 minute walk (7/22): 32 m  PMH: 1. HFPEF: With prominent RV failure.   - Echo (2/22): EF 55-60%, IV septum flattened, severe RV enlargement, severely decreased RV function, PASP 57 mmHg.  2. Venous thromboembolic disease: PE in 2426.   - Venous dopplers (4/22): No DVT.  - CTA chest (4/22): No PE.  - V/Q scan 5/22 with perfusion defect in the RML consistent with chronic PE.  3. OSA: Does not tolerate CPAP.  4. HTN 5. COPD: Prior  smoker.   - CTA chest in 4/22 showed no PE, mild emphysema.  - PFTs (6/22) with severe obstruction, moderate restriction, severely decreased DLCO 6. Pulmonary hypertension: RHC (4/22) with mean RA 5, PA 65/19 mean 36, mean PCWP 5, CI 2.19, PVR 6.1 WU, PAPi 9.2.  7. Chronic hiccups 8. BPH: Has to in and out cath at times.   Social History   Socioeconomic History   Marital status: Married    Spouse name: Not on file   Number of children: Not on file   Years of education: Not on file   Highest education level: Not on file  Occupational History   Not on file  Tobacco Use   Smoking status: Former    Packs/day: 1.00    Years: 50.00    Pack years: 50.00    Types: Cigarettes    Quit date: 08/19/2006    Years since quitting: 14.5   Smokeless tobacco: Never  Vaping Use   Vaping Use: Never used  Substance and Sexual Activity   Alcohol use: Yes    Alcohol/week: 6.0 standard drinks    Types: 6 Shots of liquor per week   Drug use: No   Sexual activity: Not on file  Other Topics Concern   Not on file  Social History Narrative   Not on file   Social Determinants of Health   Financial Resource Strain: Not on file  Food Insecurity: Not on file  Transportation Needs: Not on file  Physical Activity: Not on file  Stress: Not on file  Social Connections: Not on file  Intimate Partner Violence: Not on file   Family History  Problem Relation Age of Onset   Heart attack Brother 14   ROS: All systems reviewed and negative except as per HPI.   Current Outpatient Medications  Medication Sig Dispense Refill   allopurinol (ZYLOPRIM) 100 MG tablet Take 100 mg by mouth every morning.     apixaban (ELIQUIS) 5 MG TABS tablet Take 1 tablet (5 mg total) by mouth 2 (two) times daily. 60 tablet    azelastine (ASTELIN) 0.1 % nasal spray Place 2 sprays into both nostrils daily. Use in each nostril as directed 30 mL 5   Cinnamon 500 MG capsule Take 500 mg by mouth every morning.     doxycycline  (VIBRA-TABS) 100 MG tablet Take 100 mg by mouth 2 (two) times daily as needed (skin knot on back of neck).     finasteride (PROSCAR) 5 MG tablet Take 5 mg by mouth at bedtime.     gabapentin (NEURONTIN) 100 MG capsule Take 100 mg by mouth 3 (three) times daily.     ibuprofen (ADVIL) 200 MG tablet Take 400 mg by mouth daily as needed for headache (pain).     magnesium oxide (MAG-OX) 400 MG tablet Take 400 mg by mouth every morning.     pantoprazole (PROTONIX) 40 MG tablet Take 40 mg by mouth every morning.     Riociguat (ADEMPAS) 1 MG TABS Take 1 mg by mouth 3 (three) times daily. 90 tablet 6   tamsulosin (FLOMAX) 0.4 MG CAPS capsule Take 1 capsule (0.4 mg total) by mouth daily after supper. 30 capsule 6   Tiotropium Bromide-Olodaterol (STIOLTO RESPIMAT) 2.5-2.5 MCG/ACT AERS Inhale 2.5 mcg into the lungs in the morning and at bedtime. 4 g 0   potassium chloride (KLOR-CON) 10 MEQ tablet Take 2 tablets (20 mEq total) by mouth 2 (two) times daily. 240 tablet 3   torsemide (DEMADEX) 20 MG tablet Take 3 tablets (60 mg total) by mouth 2 (two) times daily. Take one extra tablet ( 20 mg ) for extra 3 lbs in one day. 180 tablet 11   No current facility-administered medications for this encounter.   BP 138/84   Pulse 98   Ht _0  (1.905 m)   Wt 106.1 kg (234 lb)   SpO2 94%   BMI 29.25 kg/m  General: NAD Neck: JVP 9-10 cm, no thyromegaly or thyroid nodule.  Lungs: Clear to auscultation bilaterally with normal respiratory effort. CV: Nondisplaced PMI.  Heart regular S1/S2, no S3/S4, no murmur.  2+ edema to knees.  No carotid bruit.  Normal pedal pulses.  Abdomen: Soft, nontender, no hepatosplenomegaly, no distention.  Skin: Intact without lesions or rashes.  Neurologic: Alert and oriented x 3.  Psych: Normal affect. Extremities: No clubbing or cyanosis.  HEENT: Normal.   Assessment/Plan: 1. HFPEF/RV failure: Echo (2/22) with EF 55-60%, IV septum flattened, severe RV enlargement, severely  decreased RV function, PASP 57 mmHg.  RHC in 4/22 showed normal filling presures, but today, he is volume overloaded on exam.  NYHA class III symptoms.  - Increase torsemide to 60 mg bid and increase KCl to 20 mEq bid.  - BMET/BNP today, BMET 10 days.  2. Pulmonary hypertension: PAH noted on 4/22 RHC with PVR 6.1 WU.  This appears to be multifactorial with OSA, emphysema, hypoxemia with ambulation, and a suspected chronic PE involving the right middle  lobe (group 3 and group 4 PH). Given the suspected mixed etiology with only 1 area of chronic thromboembolism (right middle lobe) as well as age, do not think that pulmonary thromboendarterectomy would be indicated.  Rheumatologic serologic workup was negative.  PFTs showed severe obstruction and moderate restriction, suggesting significant COPD. Oxygen saturation low with ambulation.  - I will try to get a smaller portable oxygen unit for him. - 6 minute walk done today.  - Continue riociguat for CTEPH, slow titration up as he has had some nausea with it.    3. OSA: Unable to tolerate CPAP, untreated.  4. Emphysema: Prior smoker.  Emphysema on CT and severe obstruction on PFTs.    5. Orthostatic symptoms: Still present.  - Stop doxazosin, start Flomax instead.  - He needs to wear graded compression stockings during the day.  6. Syncope: Single episode, may have been related to severe orthostasis.  - I will arrange for 2 wk Zio patch to assess for arrhythmias.   7. Chronic PE: He is on apixaban.   Followup in 3 wks with APP to reassess volume.   Loralie Champagne 02/28/2021

## 2021-03-01 NOTE — Addendum Note (Signed)
Encounter addended by: Stanford Scotland, RN on: 03/01/2021 3:52 PM  Actions taken: Clinical Note Signed

## 2021-03-01 NOTE — Progress Notes (Signed)
[facility]  order faxed  for oxygen therapy to [phone].

## 2021-03-02 ENCOUNTER — Telehealth (HOSPITAL_COMMUNITY): Payer: Self-pay | Admitting: Pharmacy Technician

## 2021-03-02 NOTE — Telephone Encounter (Signed)
Advanced Heart Failure Patient Advocate Encounter  I received a message from [place] with [facility]. He started taking 1 mg TID of Adempas 7/11, and is doing well on it. Will titrate 0.5 mg every 2 weeks to goal of 2.5 mg. He has a history of hypotension his blood pressure started out at 95/60, only dropped once and rebounded well. She is going to keep an eye on his BP but thinks he will do well on Adempas.   Will inform us with new information. Advised patient to check BP daily and let her know if it goes below 95 systolic and does not rebound.   Charlann Boxer, CPhT

## 2021-03-07 ENCOUNTER — Other Ambulatory Visit: Payer: Self-pay

## 2021-03-07 ENCOUNTER — Ambulatory Visit (INDEPENDENT_AMBULATORY_CARE_PROVIDER_SITE_OTHER): Payer: Medicare Other | Admitting: Pulmonary Disease

## 2021-03-07 ENCOUNTER — Encounter: Payer: Self-pay | Admitting: Pulmonary Disease

## 2021-03-07 VITALS — BP 102/60 | HR 78 | Ht 75.0 in | Wt 231.2 lb

## 2021-03-07 DIAGNOSIS — J449 Chronic obstructive pulmonary disease, unspecified: Secondary | ICD-10-CM

## 2021-03-07 DIAGNOSIS — J9611 Chronic respiratory failure with hypoxia: Secondary | ICD-10-CM

## 2021-03-07 DIAGNOSIS — I248 Other forms of acute ischemic heart disease: Secondary | ICD-10-CM

## 2021-03-07 DIAGNOSIS — I50812 Chronic right heart failure: Secondary | ICD-10-CM

## 2021-03-07 NOTE — Patient Instructions (Signed)
You have pulmonary hypertension [high pressure in the lungs] causing right heart failure and fluid buildup in your body  Ambulatory saturation -we will follow-up on your oxygen prescription to [facility]. Use oxygen when walking and during sleep  Prescription for Stiolto will be sent to her pharmacy -if this is too expensive, you can go back on Spiriva

## 2021-03-07 NOTE — Progress Notes (Signed)
Subjective:    Patient ID: Joshua [name], male    DOB: Mar 11, 1936, 85 y.o.   MRN: [id number]  HPI  85 yo former smoker for FU of COPD & pulmonary hypertension PMh/o PE postop after a knee replacement in 2016. He was hospitalized 08/2017 for 3 days for acute hypoxic respiratory failure, attributed to diastolic heart failure.  He was diuresed 2.5 L, had elevated troponin, follow-up stress Myoview was low risk for ischemia.   Meds- he has been tried on Brio, anoro & trelegy  none of these seem to provide him any symptomatic relief -he has settled on Spiriva and seems to prefer this.  Chief Complaint  Patient presents with   Follow-up    Copd still having shortness of breath, walking makes it worse. Sitting down makes it better,   He was evaluated at advanced heart failure clinic for severe pulmonary hypertension.  This was confirmed on right heart cath.  VQ scan showed chronic PE right middle lobe.  pH felt to be multifactorial due to chronic lung disease, HFpEF and PE.  He was started on riociguat -but he reports severe nausea and has been unable to increase the dose .  I have reviewed detailed heart failure consultation  Last office visit with APP -he was given Stiolto he cannot tell a difference.  In the past we have trialed triple therapy without any difference.  He does not report any more episodes of syncope.  He reports increased bipedal edema in spite of taking diuretics    Significant tests/ events reviewed Ambulatory saturation was 93% at rest and desaturated to 87% on walking, recovered with 3 L of oxygen and maintain while ambulating  RHC (4/22) with mean RA 5, PA 65/19 mean 36, mean PCWP 5, CI 2.19, PVR 6.1 WU, PAPi 9.2.  Echo 12/2017 RVSP 39  NPSG 02/2018 >> no OSA, 45 mins desatn, TST 5.5 h   09/15/14 CT chest showed a large bilateral pulmonary embolism with RV dilatation. 08/2014 Venous Doppler showed no DVT    01/29/21- FEV1 51%, no BD response, ratio 61, TLC 77%, DLCO  35%  PFTs  11/2014 - FEV1 59%, no BD response, ratio 70, TLC 68%, DLCO 35%     CT angiogram 08/2017 left upper lobe nodule 9x6 mm. CT 03/2018 which showed resolution of nodules  Past Medical History:  Diagnosis Date   Arthritis    BPH (benign prostatic hyperplasia)    COPD (chronic obstructive pulmonary disease) (HCC)    Folliculitis    posterior scalp per office visit note of Dr Maudie Mercury 07/20/2014     GERD (gastroesophageal reflux disease)    Hypertension    PE (pulmonary thromboembolism) (HCC)    Phlebitis    right arm  at least 20 years ago    Pneumonia    hx of pneumonia as a child    Sleep apnea     Review of Systems neg for any significant sore throat, dysphagia, itching, sneezing, nasal congestion or excess/ purulent secretions, fever, chills, sweats, unintended wt loss, pleuritic or exertional cp, hempoptysis, orthopnea pnd or change in chronic leg swelling. Also denies presyncope, palpitations, heartburn, abdominal pain, nausea, vomiting, diarrhea or change in bowel or urinary habits, dysuria,hematuria, rash, arthralgias, visual complaints, headache, numbness weakness or ataxia.     Objective:   Physical Exam  Gen. Pleasant, elderly,well-nourished, in no distress, normal affect ENT - no pallor,icterus, no post nasal drip Neck: No JVD, no thyromegaly, no carotid bruits Lungs: no use  of accessory muscles, no dullness to percussion, decreased  without rales or rhonchi  Cardiovascular: Rhythm regular, heart sounds  normal, no murmurs or gallops, 2+ peripheral edema Abdomen: soft and non-tender, no hepatosplenomegaly, BS normal. Musculoskeletal: No deformities, no cyanosis or clubbing Neuro:  alert, non focal       Assessment & Plan:

## 2021-03-09 ENCOUNTER — Other Ambulatory Visit (HOSPITAL_COMMUNITY): Payer: Self-pay | Admitting: Unknown Physician Specialty

## 2021-03-09 DIAGNOSIS — I272 Pulmonary hypertension, unspecified: Secondary | ICD-10-CM

## 2021-03-09 MED ORDER — ADEMPAS 1 MG PO TABS: 1.5000 mg | ORAL_TABLET | Freq: Three times a day (TID) | ORAL | 6 refills | Status: DC

## 2021-03-10 NOTE — Assessment & Plan Note (Signed)
Prescription for Stiolto will be sent to her pharmacy -if this is too expensive, you can go back on Spiriva

## 2021-03-10 NOTE — Assessment & Plan Note (Signed)
Ambulatory saturation was 93% at rest and desaturated to 87% on walking, recovered with 3 L of oxygen and maintain while ambulating  This will qualify him for oxygen

## 2021-03-10 NOTE — Assessment & Plan Note (Signed)
He is reporting severe nausea due to riociguat but no further episodes of syncope. Remains to be seen whether he can tolerate this long-term.  We will continue torsemide

## 2021-03-12 ENCOUNTER — Other Ambulatory Visit (HOSPITAL_COMMUNITY): Payer: Self-pay | Admitting: *Deleted

## 2021-03-12 DIAGNOSIS — I272 Pulmonary hypertension, unspecified: Secondary | ICD-10-CM

## 2021-03-12 MED ORDER — ADEMPAS 1.5 MG PO TABS: 1.5000 mg | ORAL_TABLET | Freq: Three times a day (TID) | ORAL | 6 refills | Status: DC

## 2021-03-13 ENCOUNTER — Other Ambulatory Visit: Payer: Self-pay

## 2021-03-13 ENCOUNTER — Ambulatory Visit (HOSPITAL_COMMUNITY)
Admission: RE | Admit: 2021-03-13 | Discharge: 2021-03-13 | Disposition: A | Discharge status: Home / Self Care | Payer: Medicare Other | Source: Ambulatory Visit | Attending: Cardiology | Admitting: Cardiology

## 2021-03-13 DIAGNOSIS — I272 Pulmonary hypertension, unspecified: Secondary | ICD-10-CM | POA: Diagnosis not present

## 2021-03-13 DIAGNOSIS — I50812 Chronic right heart failure: Secondary | ICD-10-CM | POA: Insufficient documentation

## 2021-03-13 LAB — BASIC METABOLIC PANEL
Anion gap: 7 (ref 5–15)
BUN: 26 mg/dL — ABNORMAL HIGH (ref 8–23)
CO2: 33 mmol/L — ABNORMAL HIGH (ref 22–32)
Calcium: 9 mg/dL (ref 8.9–10.3)
Chloride: 98 mmol/L (ref 98–111)
Creatinine, Ser: 1.67 mg/dL — ABNORMAL HIGH (ref 0.61–1.24)
GFR, Estimated: 40 mL/min — ABNORMAL LOW (ref >60)
Glucose, Bld: 88 mg/dL (ref 70–99)
Potassium: 3.8 mmol/L (ref 3.5–5.1)
Sodium: 138 mmol/L (ref 135–145)

## 2021-03-16 DIAGNOSIS — R55 Syncope and collapse: Secondary | ICD-10-CM | POA: Diagnosis not present

## 2021-03-18 NOTE — Progress Notes (Deleted)
Cardiology Office Note:    Date:  03/18/2021   ID:  Joshua [name], DOB 05/29/1936, MRN [id number]  PCP:  Joshua [name], [place] [facility] Cardiologist:  Joshua Mocha, MD { Click to update primary MD,subspecialty MD or APP then REFRESH:1}  ***  Referring MD: Joshua Gravel, MD   Chief Complaint:  No chief complaint on file.    Patient Profile:    Joshua [name] is a 85 y.o. male with:  COPD/emphysema  PFTs 6/22: severe obstruction; mod restriction; severely ? DLCO (HFpEF) heart failure with preserved ejection fraction w/ prominent RV failure Pulmonary hypertension Group 3 and Group 4 PH Multifactorial: OSA, emphysema, hypoxia w ambulation, chronic PE Hx of pulmonary embolism in 2016 VQ + for chronic RML pulmonary embolism (2022) Rx: Riociguat  OSA Hypertension GERD and anyone Chronic hiccups (tx with Gabapentin; Dr. Benson Norway) BPH Orthostatic hypotension   Prior CV studies: Zio Monitor 02/2021 Patient had a min HR of 46 bpm, max HR of 218 bpm, and avg HR of 83 bpm. Predominant underlying rhythm was Sinus Rhythm. Bundle Branch Block/IVCD was present. 2 Ventricular Tachycardia runs occurred, the run with the fastest interval lasting 10 beats with a max rate of 160 bpm (avg 120 bpm); the run with the fastest interval was also the longest. 17 Supraventricular Tachycardia runs occurred, the run with the fastest interval lasting 9.7 secs with a max rate of 218 bpm (avg 160 bpm); the run with the  fastest interval was also the longest. Isolated SVEs were occasional (4.3%, 65425), SVE Couplets were rare (<1.0%, 1201), and SVE Triplets were rare (<1.0%, 71). Isolated VEs were rare (<1.0%), VE Couplets were rare (<1.0%), and no VE Triplets were present.  RIGHT HEART CATH 12/13/2020 Narrative 1. Normal right and left heart filling pressures. 2. Moderate pulmonary arterial hypertension. 3. Low but not markedly low cardiac output. Based on history, most likely group 3 (COPD)  vs group 4 PH (CTEPH).  Recent CTA chest did not show evidence for PE, but would recommend V/Q scan to rule out chronic PEs given history of VTE.  Echocardiogram 09/29/20 EF 55-60, mild LVH, R ventricular pressure and volume overload, severe reduced RVSF, RVSP 56.6, severe RAE, mild to mod TR, trivial AI, AV sclerosis w/o AS, aortic root 40 mm   Echocardiogram 01/05/2018 EF 50-55, inferolateral HK, grade 1 diastolic dysfunction, ventricular septum with diastolic flattening and systolic flattening, trivial AI, mild RV dilation, severely reduced RVSF, PASP 39   Nuclear stress test 09/24/2017 EF 54, no ischemia; Low Risk   Echocardiogram 08/25/2017 Severe focal basal septal hypertrophy, EF 55-60, normal wall motion, grade 1 diastolic dysfunction, mild to moderate AI, mildly dilated aortic root, severe RV dilation, severely reduced RVSF, moderate RAE, moderate PI, PASP 73   History of Present Illness: Joshua [name] was last seen in 3/22.  He was admitted in 4/22 with syncope and was seen by Dr. Gasper Sells.  There was concern that pulmonary hypertension was playing a role and a R heart cath was arranged.  This demonstrated mod pulmonary hypertension.  A VQ scan demonstrated evidence of a RML pulmonary embolism.  He was started on Eliquis.  He was referred to Dr. Aundra Dubin.  The patient was started on Riociguat for CTEPH.    ***        Past Medical History:  Diagnosis Date   Arthritis    BPH (benign prostatic hyperplasia)    COPD (chronic obstructive pulmonary disease) (HCC)    Folliculitis  posterior scalp per office visit note of Dr Maudie Mercury 07/20/2014     GERD (gastroesophageal reflux disease)    Hypertension    PE (pulmonary thromboembolism) ([place])    Phlebitis    right arm  at least 20 years ago    Pneumonia    hx of pneumonia as a child    Sleep apnea     Current Medications: No outpatient medications have been marked as taking for the 03/19/21 encounter (Appointment) with Richardson Dopp T, PA-C.      Allergies:   Other, Sunflower oil, and Sulfa antibiotics   Social History   Tobacco Use   Smoking status: Former    Packs/day: 1.00    Years: 50.00    Pack years: 50.00    Types: Cigarettes    Start date: 47    Quit date: 08/19/2006    Years since quitting: 14.5   Smokeless tobacco: Never  Vaping Use   Vaping Use: Never used  Substance Use Topics   Alcohol use: Yes    Alcohol/week: 6.0 standard drinks    Types: 6 Shots of liquor per week   Drug use: No     Family Hx: The patient's family history includes Heart attack (age of onset: 47) in his brother.  ROS   EKGs/Labs/Other Test Reviewed:    EKG:  EKG is *** ordered today.  The ekg ordered today demonstrates ***  Recent Labs: 12/03/2020: ALT 16 12/07/2020: Magnesium 1.6; Platelets 191 12/13/2020: Hemoglobin 16.7 02/28/2021: B Natriuretic Peptide 1,361.6 03/13/2021: BUN 26; Creatinine, Ser 1.67; Potassium 3.8; Sodium 138   Recent Lipid Panel No results found for: CHOL, TRIG, HDL, LDLCALC, LDLDIRECT    Risk Assessment/Calculations:   {Does this patient have ATRIAL FIBRILLATION?[phone]}  Physical Exam:    VS:  There were no vitals taken for this visit.    Wt Readings from Last 3 Encounters:  03/07/21 231 lb 3.2 oz (104.9 kg)  02/28/21 234 lb (106.1 kg)  01/30/21 224 lb (101.6 kg)     Physical Exam ***     ASSESSMENT & PLAN:    {Select for Dx:25819} 1. Chronic right-sided heart failure ([place]) He has had significant improvement since switching furosemide to torsemide.  He did have increased creatinine after starting this.  He has a follow-up BMET pending today.  I will also check a magnesium level.  Continue current dose of torsemide.  Follow-up in 3 months.   2. Chronic obstructive pulmonary disease, unspecified COPD type ([place]) 3. Pulmonary HTN ([place]) He has chronic shortness of breath related to COPD.  He is followed chronically by pulmonology.  RVSP on recent echocardiogram was 56.6 mmHg.   4.  Essential hypertension The patient's blood pressure is controlled on his current regimen.  Continue current therapy.      {Are you ordering a CV Procedure (e.g. stress test, cath, DCCV, TEE, etc)?   Press F2        :[id number]}    Dispo:  No follow-ups on file.   Medication Adjustments/Labs and Tests Ordered: Current medicines are reviewed at length with the patient today.  Concerns regarding medicines are outlined above.  Tests Ordered: No orders of the defined types were placed in this encounter.  Medication Changes: No orders of the defined types were placed in this encounter.   Signed, Richardson Dopp, PA-C  03/18/2021 8:16 PM    [facility] [address], [place], [place]  [address] Phone: [phone]; Fax: [phone]

## 2021-03-19 ENCOUNTER — Ambulatory Visit: Payer: Medicare Other | Admitting: Physician Assistant

## 2021-03-19 ENCOUNTER — Telehealth: Payer: Self-pay | Admitting: *Deleted

## 2021-03-19 NOTE — Telephone Encounter (Signed)
**Note De-Identified  Obfuscation** The pt states that he has already reached out to [facility] and they are mailing him an application to his home. He is aware that once he receives it to complete his part of the application, obtain required documents per [employer], and to bring all to Dr York Cerise office at [facility] located on [facility] to drop off at the front office and that we will take care of the provider's page of his application and will fax all to [employer].  He does have my name and the office's phone number to call if he has any questions or needs assistance completing his application. He thanked me for calling him.

## 2021-03-19 NOTE — Telephone Encounter (Signed)
S/w pt is aware today's appt with Richardson Dopp, PA-C has been canceled.  Pt is to see Dr. Aundra Dubin in CHF clinic on Wednesday, August 3.  Pt is followed in the HF clinic. Pt stated Eliquis was to expensive.  Gave pt assistance phone number to call and will send LynnVia, LPN a message to help pt with the process.

## 2021-03-20 NOTE — Progress Notes (Addendum)
PCP: [name], DO Cardiology: Dr. Burt Knack HF Cardiology: Dr. Aundra Dubin  85 y.o. with history of HFPEF, prior PE, and pulmonary hypertension was referred by Dr. Gasper Sells for evaluation of pulmonary hypertension.  Patient had a PE in 2016.  He is on apixaban. He has a long history of diastolic CHF.  Most recent echo showed a significant component of RV failure with EF 55-60%, IV septum flattened, severe RV enlargement, severely decreased RV function, PASP 57 mmHg.  CTA chest in 4/22 showed no acute PE and mild emphysema, but V/Q scan in 5/22 was suggestive of chronic PE in the right middle lobe.  RHC in 4/22 showed normal filling pressures with moderate PAH. He additionally has chronic hiccups followed by Dr. Benson Norway, now on gabapentin. PFTs in 6/22 showed mixed picture with severe obstruction, moderate restriction, and severely decreased DLCO.   Today he returns for HF follow up with his wife & daughter. Feels his swelling is a little bit better since last visit when torsemide was increased. He has not been wearing compression stockings. Increasingly more episodes of dizziness, but no syncope (per wife). SOB with stairs and walking on incline. Wife says his breathing is short walking on flat ground. Denies CP or PND/Orthopnea. Appetite ok. No fever or chills. Weight at home 229-231 pounds. Taking all medications. He self caths 2-3x/day due to BPH with urinary obstruction.  ECG (personally reviewed): SR w/ PACs, rBBB, qrs 152 ms  Labs (4/22): K 4.8, creatinine 1.67 Labs (6/22): K 3.8, creatinine 1.72, ANA negative, RF 39.6 but CCP negative, SCL-70 negative, BNP 1231, HIV negative Labs (7/22): K 3.8, creatine 1.67  6 minute walk (7/22): 47 m  PMH: 1. HFPEF: With prominent RV failure.   - Echo (2/22): EF 55-60%, IV septum flattened, severe RV enlargement, severely decreased RV function, PASP 57 mmHg.  2. Venous thromboembolic disease: PE in 9528.   - Venous dopplers (4/22): No DVT.  - CTA chest  (4/22): No PE.  - V/Q scan 5/22 with perfusion defect in the RML consistent with chronic PE.  3. OSA: Does not tolerate CPAP.  4. HTN 5. COPD: Prior smoker.   - CTA chest in 4/22 showed no PE, mild emphysema.  - PFTs (6/22) with severe obstruction, moderate restriction, severely decreased DLCO 6. Pulmonary hypertension: RHC (4/22) with mean RA 5, PA 65/19 mean 36, mean PCWP 5, CI 2.19, PVR 6.1 WU, PAPi 9.2.  7. Chronic hiccups 8. BPH: Has to in and out cath at times. 9. Syncope: Zio 2 week mostly SR, BBB/IVCD present, 2 episodes of VT occurred, fastest 10 beats w/ max rate of 160 bpm, 17 SVT runs with fastest interval 9.7 seconds with max rate of 218   Social History   Socioeconomic History   Marital status: Married    Spouse name: Not on file   Number of children: Not on file   Years of education: Not on file   Highest education level: Not on file  Occupational History   Not on file  Tobacco Use   Smoking status: Former    Packs/day: 1.00    Years: 50.00    Pack years: 50.00    Types: Cigarettes    Start date: 75    Quit date: 08/19/2006    Years since quitting: 14.5   Smokeless tobacco: Never  Vaping Use   Vaping Use: Never used  Substance and Sexual Activity   Alcohol use: Yes    Alcohol/week: 6.0 standard drinks    Types:  6 Shots of liquor per week   Drug use: No   Sexual activity: Not on file  Other Topics Concern   Not on file  Social History Narrative   Not on file   Social Determinants of Health   Financial Resource Strain: Not on file  Food Insecurity: Not on file  Transportation Needs: Not on file  Physical Activity: Not on file  Stress: Not on file  Social Connections: Not on file  Intimate Partner Violence: Not on file   Family History  Problem Relation Age of Onset   Heart attack Brother 42   ROS: All systems reviewed and negative except as per HPI.   Current Outpatient Medications  Medication Sig Dispense Refill   allopurinol (ZYLOPRIM) 100  MG tablet Take 100 mg by mouth every morning.     apixaban (ELIQUIS) 5 MG TABS tablet Take 1 tablet (5 mg total) by mouth 2 (two) times daily. 60 tablet    azelastine (ASTELIN) 0.1 % nasal spray Place 2 sprays into both nostrils daily. Use in each nostril as directed 30 mL 5   Cinnamon 500 MG capsule Take 500 mg by mouth every morning.     doxycycline (VIBRA-TABS) 100 MG tablet Take 100 mg by mouth 2 (two) times daily as needed (skin knot on back of neck).     finasteride (PROSCAR) 5 MG tablet Take 5 mg by mouth at bedtime.     gabapentin (NEURONTIN) 100 MG capsule Take 100 mg by mouth 3 (three) times daily.     ibuprofen (ADVIL) 200 MG tablet Take 400 mg by mouth daily as needed for headache (pain).     magnesium oxide (MAG-OX) 400 MG tablet Take 400 mg by mouth every morning.     pantoprazole (PROTONIX) 40 MG tablet Take 40 mg by mouth every morning.     potassium chloride (KLOR-CON) 10 MEQ tablet Take 2 tablets (20 mEq total) by mouth 2 (two) times daily. 240 tablet 3   Riociguat (ADEMPAS) 1.5 MG TABS Take 1.5 mg by mouth in the morning, at noon, and at bedtime. 90 tablet 6   tamsulosin (FLOMAX) 0.4 MG CAPS capsule Take 1 capsule (0.4 mg total) by mouth daily after supper. 30 capsule 6   Tiotropium Bromide-Olodaterol (STIOLTO RESPIMAT) 2.5-2.5 MCG/ACT AERS Inhale 2.5 mcg into the lungs in the morning and at bedtime. (Patient taking differently: Inhale 2.5 mcg into the lungs daily.) 4 g 0   torsemide (DEMADEX) 20 MG tablet Take 3 tablets (60 mg total) by mouth 2 (two) times daily. Take one extra tablet ( 20 mg ) for extra 3 lbs in one day. 180 tablet 11   No current facility-administered medications for this encounter.   Wt Readings from Last 3 Encounters:  03/21/21 107 kg (235 lb 12.8 oz)  03/07/21 104.9 kg (231 lb 3.2 oz)  02/28/21 106.1 kg (234 lb)   BP 128/70   Pulse 80   Wt 107 kg (235 lb 12.8 oz)   SpO2 93%   BMI 29.47 kg/m  ReDs: 32% General:  NAD. No resp difficulty,  elderly HEENT: Normal Neck: Supple. JVP 7-8. Carotids 2+ bilat; no bruits. No lymphadenopathy or thryomegaly appreciated. Cor: PMI nondisplaced. Rregular rate & rhythm. No rubs, gallops or murmurs. Lungs: Clear Abdomen: Obese, nontender, nondistended. No hepatosplenomegaly. No bruits or masses. Good bowel sounds. Extremities: No cyanosis, clubbing, rash, 2+ BLE edema to knees, skin tear with weeping to left inner ankle Neuro: Alert & oriented x 3, cranial nerves  grossly intact. Moves all 4 extremities w/o difficulty. Affect pleasant.  Assessment/Plan: 1. HFPEF/RV failure: Echo (2/22) with EF 55-60%, IV septum flattened, severe RV enlargement, severely decreased RV function, PASP 57 mmHg.  RHC in 4/22 showed normal filling pressures. Today, he appears volume overloaded on exam, weight is up, however, ReDs 32%.  NYHA class III symptoms.  - Continue torsemide 60 mg bid + KCl to 20 mEq bid for now.  - Place Unna boots. - BMET/BNP today.  2. Pulmonary hypertension: PAH noted on 4/22 RHC with PVR 6.1 WU.  This appears to be multifactorial with OSA, emphysema, hypoxemia with ambulation, and a suspected chronic PE involving the right middle lobe (group 3 and group 4 PH). Given the suspected mixed etiology with only 1 area of chronic thromboembolism (right middle lobe) as well as age, do not think that pulmonary thromboendarterectomy would be indicated.  Rheumatologic serologic workup was negative.  PFTs showed severe obstruction and moderate restriction, suggesting significant COPD. Oxygen saturation low with ambulation.  - Will try to get a smaller portable oxygen unit for him. - Continue riociguat for CTEPH, slow titration up as he has had some nausea with it.    3. OSA: Unable to tolerate CPAP, untreated.  4. Emphysema: Prior smoker.  Emphysema on CT and severe obstruction on PFTs.    5. Orthostatic symptoms: Still present, per wife, although patient feels his symptoms are better.  - Start midodrine 5  mg tid. He is not orthostatic today however he tells me him BP at home is 80s-100s. - Will keep Flomax for now. Encouraged him to discuss with his urologist. - He needs to wear graded compression stockings during the day. Strongly encouraged compliance to assist with symptoms. 6. Syncope: Single episode, may have been related to severe orthostasis. No further overt syncope. - 2 week Zio patch showed mostly SR, BBB/IVCD present, 2 episodes of VT occurred, fastest 10 beats w/ max rate of 160 bpm, 17 SVT runs with fastest interval 9.7 seconds with max rate of 218  7. Chronic PE: He is on apixaban. No bleeding issues. - CBC today.  Followup in 2-3 wks with APP to reassess volume and orthostatic symptoms.  [place] FNP 03/21/2021

## 2021-03-21 ENCOUNTER — Encounter (HOSPITAL_COMMUNITY): Payer: Self-pay

## 2021-03-21 ENCOUNTER — Other Ambulatory Visit: Payer: Self-pay

## 2021-03-21 ENCOUNTER — Ambulatory Visit (HOSPITAL_COMMUNITY)
Admission: RE | Admit: 2021-03-21 | Discharge: 2021-03-21 | Disposition: A | Discharge status: Home / Self Care | Payer: Medicare Other | Source: Ambulatory Visit | Attending: Family Medicine | Admitting: Family Medicine

## 2021-03-21 VITALS — BP 128/70 | HR 80 | Wt 235.8 lb

## 2021-03-21 DIAGNOSIS — Z87891 Personal history of nicotine dependence: Secondary | ICD-10-CM | POA: Insufficient documentation

## 2021-03-21 DIAGNOSIS — N401 Enlarged prostate with lower urinary tract symptoms: Secondary | ICD-10-CM | POA: Diagnosis not present

## 2021-03-21 DIAGNOSIS — Z79899 Other long term (current) drug therapy: Secondary | ICD-10-CM | POA: Insufficient documentation

## 2021-03-21 DIAGNOSIS — I2724 Chronic thromboembolic pulmonary hypertension: Secondary | ICD-10-CM | POA: Insufficient documentation

## 2021-03-21 DIAGNOSIS — I50812 Chronic right heart failure: Secondary | ICD-10-CM | POA: Diagnosis not present

## 2021-03-21 DIAGNOSIS — Z7901 Long term (current) use of anticoagulants: Secondary | ICD-10-CM | POA: Diagnosis not present

## 2021-03-21 DIAGNOSIS — I2782 Chronic pulmonary embolism: Secondary | ICD-10-CM | POA: Diagnosis not present

## 2021-03-21 DIAGNOSIS — I110 Hypertensive heart disease with heart failure: Secondary | ICD-10-CM | POA: Insufficient documentation

## 2021-03-21 DIAGNOSIS — Z7951 Long term (current) use of inhaled steroids: Secondary | ICD-10-CM | POA: Insufficient documentation

## 2021-03-21 DIAGNOSIS — N138 Other obstructive and reflux uropathy: Secondary | ICD-10-CM

## 2021-03-21 DIAGNOSIS — I5032 Chronic diastolic (congestive) heart failure: Secondary | ICD-10-CM | POA: Insufficient documentation

## 2021-03-21 DIAGNOSIS — Z86711 Personal history of pulmonary embolism: Secondary | ICD-10-CM

## 2021-03-21 DIAGNOSIS — I272 Pulmonary hypertension, unspecified: Secondary | ICD-10-CM

## 2021-03-21 DIAGNOSIS — J439 Emphysema, unspecified: Secondary | ICD-10-CM | POA: Diagnosis not present

## 2021-03-21 DIAGNOSIS — G4733 Obstructive sleep apnea (adult) (pediatric): Secondary | ICD-10-CM | POA: Diagnosis not present

## 2021-03-21 DIAGNOSIS — R55 Syncope and collapse: Secondary | ICD-10-CM | POA: Diagnosis not present

## 2021-03-21 DIAGNOSIS — J449 Chronic obstructive pulmonary disease, unspecified: Secondary | ICD-10-CM | POA: Diagnosis not present

## 2021-03-21 DIAGNOSIS — R42 Dizziness and giddiness: Secondary | ICD-10-CM

## 2021-03-21 LAB — CBC
HCT: 47 % (ref 39.0–52.0)
Hemoglobin: 15.3 g/dL (ref 13.0–17.0)
MCH: 29 pg (ref 26.0–34.0)
MCHC: 32.6 g/dL (ref 30.0–36.0)
MCV: 89 fL (ref 80.0–100.0)
Platelets: 186 10*3/uL (ref 150–400)
RBC: 5.28 MIL/uL (ref 4.22–5.81)
RDW: 16.8 % — ABNORMAL HIGH (ref 11.5–15.5)
WBC: 3.6 10*3/uL — ABNORMAL LOW (ref 4.0–10.5)
nRBC: 0 % (ref 0.0–0.2)

## 2021-03-21 LAB — BASIC METABOLIC PANEL
Anion gap: 9 (ref 5–15)
BUN: 25 mg/dL — ABNORMAL HIGH (ref 8–23)
CO2: 32 mmol/L (ref 22–32)
Calcium: 9 mg/dL (ref 8.9–10.3)
Chloride: 96 mmol/L — ABNORMAL LOW (ref 98–111)
Creatinine, Ser: 1.61 mg/dL — ABNORMAL HIGH (ref 0.61–1.24)
GFR, Estimated: 42 mL/min — ABNORMAL LOW (ref >60)
Glucose, Bld: 79 mg/dL (ref 70–99)
Potassium: 3.8 mmol/L (ref 3.5–5.1)
Sodium: 137 mmol/L (ref 135–145)

## 2021-03-21 LAB — BRAIN NATRIURETIC PEPTIDE: B Natriuretic Peptide: 1487.5 pg/mL — ABNORMAL HIGH (ref 0.0–100.0)

## 2021-03-21 MED ORDER — MIDODRINE HCL 5 MG PO TABS
5.0000 mg | ORAL_TABLET | Freq: Three times a day (TID) | ORAL | 3 refills | Status: DC
Start: 2021-03-21 — End: 2021-04-11

## 2021-03-21 NOTE — Progress Notes (Signed)
ReDS Vest / Clip - 03/21/21 1100       ReDS Vest / Clip   Station Marker C    Ruler Value 28    ReDS Value Range Low volume    ReDS Actual Value 32

## 2021-03-21 NOTE — Patient Instructions (Addendum)
START Midodrine 19m, one tab three times daily  Labs today We will only contact you if something comes back abnormal or we need to make some changes. Otherwise no news is good news!  You have been referred to [facility] to assist with unna boots -they will be in contact to arrange a home visit  Your physician recommends that you schedule a follow-up appointment in: 2-3 weeks  in the [facility]    Do the following things EVERYDAY: Weigh yourself in the morning before breakfast. Write it down and keep it in a log. Take your medicines as prescribed Eat low salt foods--Limit salt (sodium) to 2000 mg per day.  Stay as active as you can everyday Limit all fluids for the day to less than 2 liters  milAt the [facility], you and your health needs are our priority. As part of our continuing mission to provide you with exceptional heart care, we have created designated Provider Care Teams. These Care Teams include your primary Cardiologist (physician) and Advanced Practice Providers (APPs- Physician Assistants and Nurse Practitioners) who all work together to provide you with the care you need, when you need it.   You may see any of the following providers on your designated Care Team at your next follow up: Dr DGlori BickersDr DLoralie ChampagneDr BPatrice Paradise NP BLyda Jester PUtahJGinnie SmartLAudry Riles PharmD   Please be sure to bring in all your medications bottles to every appointment.

## 2021-03-23 DIAGNOSIS — Z7901 Long term (current) use of anticoagulants: Secondary | ICD-10-CM | POA: Diagnosis not present

## 2021-03-23 DIAGNOSIS — I5032 Chronic diastolic (congestive) heart failure: Secondary | ICD-10-CM | POA: Diagnosis not present

## 2021-03-23 DIAGNOSIS — N400 Enlarged prostate without lower urinary tract symptoms: Secondary | ICD-10-CM | POA: Diagnosis not present

## 2021-03-23 DIAGNOSIS — M1990 Unspecified osteoarthritis, unspecified site: Secondary | ICD-10-CM | POA: Diagnosis not present

## 2021-03-23 DIAGNOSIS — G4730 Sleep apnea, unspecified: Secondary | ICD-10-CM | POA: Diagnosis not present

## 2021-03-23 DIAGNOSIS — Z4800 Encounter for change or removal of nonsurgical wound dressing: Secondary | ICD-10-CM | POA: Diagnosis not present

## 2021-03-23 DIAGNOSIS — S81812D Laceration without foreign body, left lower leg, subsequent encounter: Secondary | ICD-10-CM | POA: Diagnosis not present

## 2021-03-23 DIAGNOSIS — J449 Chronic obstructive pulmonary disease, unspecified: Secondary | ICD-10-CM | POA: Diagnosis not present

## 2021-03-23 DIAGNOSIS — I50812 Chronic right heart failure: Secondary | ICD-10-CM | POA: Diagnosis not present

## 2021-03-23 DIAGNOSIS — I110 Hypertensive heart disease with heart failure: Secondary | ICD-10-CM | POA: Diagnosis not present

## 2021-03-23 DIAGNOSIS — I2699 Other pulmonary embolism without acute cor pulmonale: Secondary | ICD-10-CM | POA: Diagnosis not present

## 2021-03-23 DIAGNOSIS — Z87891 Personal history of nicotine dependence: Secondary | ICD-10-CM | POA: Diagnosis not present

## 2021-03-23 DIAGNOSIS — Z9181 History of falling: Secondary | ICD-10-CM | POA: Diagnosis not present

## 2021-03-23 DIAGNOSIS — I272 Pulmonary hypertension, unspecified: Secondary | ICD-10-CM | POA: Diagnosis not present

## 2021-03-23 DIAGNOSIS — K219 Gastro-esophageal reflux disease without esophagitis: Secondary | ICD-10-CM | POA: Diagnosis not present

## 2021-03-26 ENCOUNTER — Other Ambulatory Visit (HOSPITAL_COMMUNITY): Payer: Self-pay | Admitting: *Deleted

## 2021-03-26 DIAGNOSIS — N179 Acute kidney failure, unspecified: Secondary | ICD-10-CM | POA: Diagnosis not present

## 2021-03-26 DIAGNOSIS — K219 Gastro-esophageal reflux disease without esophagitis: Secondary | ICD-10-CM | POA: Diagnosis not present

## 2021-03-26 DIAGNOSIS — E119 Type 2 diabetes mellitus without complications: Secondary | ICD-10-CM | POA: Diagnosis not present

## 2021-03-26 MED ORDER — ADEMPAS 2.5 MG PO TABS: 2.5000 mg | ORAL_TABLET | Freq: Three times a day (TID) | ORAL | 3 refills | Status: DC

## 2021-03-30 DIAGNOSIS — I110 Hypertensive heart disease with heart failure: Secondary | ICD-10-CM | POA: Diagnosis not present

## 2021-03-30 DIAGNOSIS — I272 Pulmonary hypertension, unspecified: Secondary | ICD-10-CM | POA: Diagnosis not present

## 2021-03-30 DIAGNOSIS — I2699 Other pulmonary embolism without acute cor pulmonale: Secondary | ICD-10-CM | POA: Diagnosis not present

## 2021-03-30 DIAGNOSIS — I5032 Chronic diastolic (congestive) heart failure: Secondary | ICD-10-CM | POA: Diagnosis not present

## 2021-03-30 DIAGNOSIS — J449 Chronic obstructive pulmonary disease, unspecified: Secondary | ICD-10-CM | POA: Diagnosis not present

## 2021-03-30 DIAGNOSIS — I50812 Chronic right heart failure: Secondary | ICD-10-CM | POA: Diagnosis not present

## 2021-04-04 DIAGNOSIS — I272 Pulmonary hypertension, unspecified: Secondary | ICD-10-CM | POA: Diagnosis not present

## 2021-04-04 DIAGNOSIS — I2699 Other pulmonary embolism without acute cor pulmonale: Secondary | ICD-10-CM | POA: Diagnosis not present

## 2021-04-04 DIAGNOSIS — I50812 Chronic right heart failure: Secondary | ICD-10-CM | POA: Diagnosis not present

## 2021-04-04 DIAGNOSIS — J449 Chronic obstructive pulmonary disease, unspecified: Secondary | ICD-10-CM | POA: Diagnosis not present

## 2021-04-04 DIAGNOSIS — I5032 Chronic diastolic (congestive) heart failure: Secondary | ICD-10-CM | POA: Diagnosis not present

## 2021-04-04 DIAGNOSIS — I110 Hypertensive heart disease with heart failure: Secondary | ICD-10-CM | POA: Diagnosis not present

## 2021-04-06 DIAGNOSIS — I272 Pulmonary hypertension, unspecified: Secondary | ICD-10-CM | POA: Diagnosis not present

## 2021-04-06 DIAGNOSIS — I2699 Other pulmonary embolism without acute cor pulmonale: Secondary | ICD-10-CM | POA: Diagnosis not present

## 2021-04-06 DIAGNOSIS — I5032 Chronic diastolic (congestive) heart failure: Secondary | ICD-10-CM | POA: Diagnosis not present

## 2021-04-06 DIAGNOSIS — I110 Hypertensive heart disease with heart failure: Secondary | ICD-10-CM | POA: Diagnosis not present

## 2021-04-06 DIAGNOSIS — I50812 Chronic right heart failure: Secondary | ICD-10-CM | POA: Diagnosis not present

## 2021-04-06 DIAGNOSIS — J449 Chronic obstructive pulmonary disease, unspecified: Secondary | ICD-10-CM | POA: Diagnosis not present

## 2021-04-09 DIAGNOSIS — I2699 Other pulmonary embolism without acute cor pulmonale: Secondary | ICD-10-CM | POA: Diagnosis not present

## 2021-04-09 DIAGNOSIS — I5032 Chronic diastolic (congestive) heart failure: Secondary | ICD-10-CM | POA: Diagnosis not present

## 2021-04-09 DIAGNOSIS — I50812 Chronic right heart failure: Secondary | ICD-10-CM | POA: Diagnosis not present

## 2021-04-09 DIAGNOSIS — R066 Hiccough: Secondary | ICD-10-CM | POA: Diagnosis not present

## 2021-04-09 DIAGNOSIS — J449 Chronic obstructive pulmonary disease, unspecified: Secondary | ICD-10-CM | POA: Diagnosis not present

## 2021-04-09 DIAGNOSIS — I110 Hypertensive heart disease with heart failure: Secondary | ICD-10-CM | POA: Diagnosis not present

## 2021-04-09 DIAGNOSIS — K449 Diaphragmatic hernia without obstruction or gangrene: Secondary | ICD-10-CM | POA: Diagnosis not present

## 2021-04-09 DIAGNOSIS — E7800 Pure hypercholesterolemia, unspecified: Secondary | ICD-10-CM | POA: Diagnosis not present

## 2021-04-09 DIAGNOSIS — N1832 Chronic kidney disease, stage 3b: Secondary | ICD-10-CM | POA: Diagnosis not present

## 2021-04-09 DIAGNOSIS — M109 Gout, unspecified: Secondary | ICD-10-CM | POA: Diagnosis not present

## 2021-04-09 DIAGNOSIS — I272 Pulmonary hypertension, unspecified: Secondary | ICD-10-CM | POA: Diagnosis not present

## 2021-04-09 DIAGNOSIS — R490 Dysphonia: Secondary | ICD-10-CM | POA: Diagnosis not present

## 2021-04-09 DIAGNOSIS — N401 Enlarged prostate with lower urinary tract symptoms: Secondary | ICD-10-CM | POA: Diagnosis not present

## 2021-04-09 DIAGNOSIS — H6993 Unspecified Eustachian tube disorder, bilateral: Secondary | ICD-10-CM | POA: Diagnosis not present

## 2021-04-09 DIAGNOSIS — R7309 Other abnormal glucose: Secondary | ICD-10-CM | POA: Diagnosis not present

## 2021-04-10 NOTE — Progress Notes (Signed)
PCP: [name], DO Cardiology: Dr. Burt Knack HF Cardiology: Dr. Aundra Dubin  85 y.o. with history of HFPEF, prior PE, and pulmonary hypertension was referred by Dr. Gasper Sells for evaluation of pulmonary hypertension.  Patient had a PE in 2016.  He is on apixaban. He has a long history of diastolic CHF.  Most recent echo showed a significant component of RV failure with EF 55-60%, IV septum flattened, severe RV enlargement, severely decreased RV function, PASP 57 mmHg.  CTA chest in 4/22 showed no acute PE and mild emphysema, but V/Q scan in 5/22 was suggestive of chronic PE in the right middle lobe.  RHC in 4/22 showed normal filling pressures with moderate PAH. He additionally has chronic hiccups followed by Dr. Benson Norway, now on gabapentin. PFTs in 6/22 showed mixed picture with severe obstruction, moderate restriction, and severely decreased DLCO.   Today he returns for HF follow up with his wife. Last visit he was overloaded and we increased his diuretic, placed unna boots and started midodrine and placed a Zio patch to quantify any arrhythmia that could be responsible for his syncope. Today, he says his legs are more swollen, he is SOB walking on flat surfaces with his walker and he had 1 episode of syncope last week, did not hit his head. He stopped midodrine as he said it made him too sleepy. Denies CP or PND/Orthopnea. Appetite fair. No fever or chills. His wife says he has been sleeping a lot. He self caths 2-3x/day due to BPH with urinary obstruction.  ECG (personally reviewed): SR w/ PACs, rBBB, qrs 152 ms  Labs (4/22): K 4.8, creatinine 1.67 Labs (6/22): K 3.8, creatinine 1.72, ANA negative, RF 39.6 but CCP negative, SCL-70 negative, BNP 1231, HIV negative Labs (7/22): K 3.8, creatine 1.67 Labs (8/22): K 3.6, creatinine 1.6, hgb 15.3   6 minute walk (7/22): 16 m  PMH: 1. HFPEF: With prominent RV failure.   - Echo (2/22): EF 55-60%, IV septum flattened, severe RV enlargement, severely  decreased RV function, PASP 57 mmHg.  2. Venous thromboembolic disease: PE in 6333.   - Venous dopplers (4/22): No DVT.  - CTA chest (4/22): No PE.  - V/Q scan 5/22 with perfusion defect in the RML consistent with chronic PE.  3. OSA: Does not tolerate CPAP.  4. HTN 5. COPD: Prior smoker.   - CTA chest in 4/22 showed no PE, mild emphysema.  - PFTs (6/22) with severe obstruction, moderate restriction, severely decreased DLCO 6. Pulmonary hypertension: RHC (4/22) with mean RA 5, PA 65/19 mean 36, mean PCWP 5, CI 2.19, PVR 6.1 WU, PAPi 9.2.  7. Chronic hiccups 8. BPH: Has to in and out cath at times. 9. Syncope: Zio 2 week mostly SR, BBB/IVCD present, 2 episodes of VT occurred, fastest 10 beats w/ max rate of 160 bpm, 17 SVT runs with fastest interval 9.7 seconds with max rate of 218   Social History   Socioeconomic History   Marital status: Married    Spouse name: Not on file   Number of children: Not on file   Years of education: Not on file   Highest education level: Not on file  Occupational History   Not on file  Tobacco Use   Smoking status: Former    Packs/day: 1.00    Years: 50.00    Pack years: 50.00    Types: Cigarettes    Start date: 55    Quit date: 08/19/2006    Years since quitting: 39.6  Smokeless tobacco: Never  Vaping Use   Vaping Use: Never used  Substance and Sexual Activity   Alcohol use: Yes    Alcohol/week: 6.0 standard drinks    Types: 6 Shots of liquor per week   Drug use: No   Sexual activity: Not on file  Other Topics Concern   Not on file  Social History Narrative   Not on file   Social Determinants of Health   Financial Resource Strain: Not on file  Food Insecurity: Not on file  Transportation Needs: Not on file  Physical Activity: Not on file  Stress: Not on file  Social Connections: Not on file  Intimate Partner Violence: Not on file   Family History  Problem Relation Age of Onset   Heart attack Brother 35   ROS: All systems  reviewed and negative except as per HPI.   Current Outpatient Medications  Medication Sig Dispense Refill   allopurinol (ZYLOPRIM) 100 MG tablet Take 100 mg by mouth every morning.     apixaban (ELIQUIS) 5 MG TABS tablet Take 1 tablet (5 mg total) by mouth 2 (two) times daily. 60 tablet    azelastine (ASTELIN) 0.1 % nasal spray Place 2 sprays into both nostrils daily. Use in each nostril as directed 30 mL 5   Cinnamon 500 MG capsule Take 500 mg by mouth every morning.     doxycycline (VIBRA-TABS) 100 MG tablet Take 100 mg by mouth 2 (two) times daily as needed (skin knot on back of neck).     finasteride (PROSCAR) 5 MG tablet Take 5 mg by mouth at bedtime.     gabapentin (NEURONTIN) 100 MG capsule Take 100 mg by mouth 3 (three) times daily.     ibuprofen (ADVIL) 200 MG tablet Take 400 mg by mouth daily as needed for headache (pain).     magnesium oxide (MAG-OX) 400 MG tablet Take 400 mg by mouth every morning.     pantoprazole (PROTONIX) 40 MG tablet Take 40 mg by mouth every morning.     potassium chloride (KLOR-CON) 10 MEQ tablet Take 2 tablets (20 mEq total) by mouth 2 (two) times daily. 240 tablet 3   Riociguat (ADEMPAS) 1.5 MG TABS Take 1.5 mg by mouth in the morning, at noon, and at bedtime. 90 tablet 6   tamsulosin (FLOMAX) 0.4 MG CAPS capsule Take 1 capsule (0.4 mg total) by mouth daily after supper. 30 capsule 6   Tiotropium Bromide-Olodaterol (STIOLTO RESPIMAT) 2.5-2.5 MCG/ACT AERS Inhale 2.5 mcg into the lungs in the morning and at bedtime. (Patient taking differently: Inhale 2.5 mcg into the lungs daily.) 4 g 0   torsemide (DEMADEX) 20 MG tablet Take 3 tablets (60 mg total) by mouth 2 (two) times daily. Take one extra tablet ( 20 mg ) for extra 3 lbs in one day. 180 tablet 11   No current facility-administered medications for this encounter.   Wt Readings from Last 3 Encounters:  04/11/21 112.2 kg (247 lb 6.4 oz)  03/21/21 107 kg (235 lb 12.8 oz)  03/07/21 104.9 kg (231 lb 3.2  oz)   BP 100/60   Pulse 83   Wt 112.2 kg (247 lb 6.4 oz)   SpO2 91%   BMI 30.92 kg/m  ReDs 32% General:  NAD. No resp difficulty, elderly, walked into clinic with walker. HEENT: Normal Neck: Supple. JVP to ear. Carotids 2+ bilat; no bruits. No lymphadenopathy or thryomegaly appreciated. Cor: PMI nondisplaced. Regular rate & rhythm. No rubs, gallops or murmurs.  Lungs: Fine crackles RLL. Abdomen: Soft, nontender, nondistended. No hepatosplenomegaly. No bruits or masses. Good bowel sounds. Extremities: No cyanosis, clubbing, rash, unna boots on with 2+ LE edema Neuro: Alert & oriented x 3, cranial nerves grossly intact. Moves all 4 extremities w/o difficulty. Affect pleasant.  Assessment/Plan: 1. Acute on Chronic HFpEF/RV failure: Echo (2/22) with EF 55-60%, IV septum flattened, severe RV enlargement, severely decreased RV function, PASP 57 mmHg.  RHC in 4/22 showed normal filling pressures. Today, he appears markedly volume overloaded on exam, weight is up 12 lbs, however, ReDs 32%.  NYHA class IIIb symptoms.  - Will arrange for Home Health to give lasix 80 mg IV + extra 40 KCl today and tomorrow. - Hold today and tomorrow's doses of torsemide, then resume at 60 mg bid. - Continue Unna boots. - BMET/BNP today. 2. Pulmonary hypertension: PAH noted on 4/22 RHC with PVR 6.1 WU.  This appears to be multifactorial with OSA, emphysema, hypoxemia with ambulation, and a suspected chronic PE involving the right middle lobe (group 3 and group 4 PH). Given the suspected mixed etiology with only 1 area of chronic thromboembolism (right middle lobe) as well as age, do not think that pulmonary thromboendarterectomy would be indicated.  Rheumatologic serologic workup was negative.  PFTs showed severe obstruction and moderate restriction, suggesting significant COPD. Oxygen saturation low with ambulation.  - Will try to get a smaller portable oxygen unit for him. - Continue riociguat for CTEPH, slow  titration up as he has had some nausea with it.   Discussed with Dr. Aundra Dubin today. 3. OSA: Unable to tolerate CPAP, untreated.  4. Emphysema: Prior smoker.  Emphysema on CT and severe obstruction on PFTs.    5. Orthostatic symptoms: Still present, per wife, although patient feels his symptoms are better.  - Continue Flomax. Encouraged him to discuss with his urologist. Louretta Parma boots on now, will need to keep graded compression stockings on in future. 6. Syncope: He stopped his midodrine after it made him sleepy. Discussed with Dr. Aundra Dubin, will re-challenge at a lower dose if he is agreeable. - Re-start midodrine 2.5 mg tid. - Zio 14 day showed mostly SR, BBB/IVCD present, no major arrhythmias. 7. Chronic PE: He is on apixaban. No bleeding issues.  Followup early next week with APP to reassess volume. If kidney function declines and he is still volume overloaded, will need to be admitted.   [place] FNP 04/11/2021

## 2021-04-11 ENCOUNTER — Encounter (HOSPITAL_COMMUNITY): Payer: Self-pay

## 2021-04-11 ENCOUNTER — Other Ambulatory Visit: Payer: Self-pay

## 2021-04-11 ENCOUNTER — Ambulatory Visit (HOSPITAL_BASED_OUTPATIENT_CLINIC_OR_DEPARTMENT_OTHER)
Admission: RE | Admit: 2021-04-11 | Discharge: 2021-04-11 | Disposition: A | Discharge status: Home / Self Care | Payer: Medicare Other | Source: Ambulatory Visit | Attending: Family Medicine | Admitting: Family Medicine

## 2021-04-11 ENCOUNTER — Other Ambulatory Visit (HOSPITAL_COMMUNITY): Payer: Self-pay

## 2021-04-11 VITALS — BP 100/60 | HR 83 | Wt 247.4 lb

## 2021-04-11 DIAGNOSIS — G4733 Obstructive sleep apnea (adult) (pediatric): Secondary | ICD-10-CM | POA: Insufficient documentation

## 2021-04-11 DIAGNOSIS — J811 Chronic pulmonary edema: Secondary | ICD-10-CM | POA: Diagnosis not present

## 2021-04-11 DIAGNOSIS — I509 Heart failure, unspecified: Secondary | ICD-10-CM | POA: Diagnosis not present

## 2021-04-11 DIAGNOSIS — I2699 Other pulmonary embolism without acute cor pulmonale: Secondary | ICD-10-CM | POA: Diagnosis not present

## 2021-04-11 DIAGNOSIS — Z79899 Other long term (current) drug therapy: Secondary | ICD-10-CM | POA: Insufficient documentation

## 2021-04-11 DIAGNOSIS — R55 Syncope and collapse: Secondary | ICD-10-CM | POA: Diagnosis not present

## 2021-04-11 DIAGNOSIS — I2724 Chronic thromboembolic pulmonary hypertension: Secondary | ICD-10-CM | POA: Diagnosis not present

## 2021-04-11 DIAGNOSIS — I5032 Chronic diastolic (congestive) heart failure: Secondary | ICD-10-CM | POA: Diagnosis not present

## 2021-04-11 DIAGNOSIS — I50812 Chronic right heart failure: Secondary | ICD-10-CM

## 2021-04-11 DIAGNOSIS — Z09 Encounter for follow-up examination after completed treatment for conditions other than malignant neoplasm: Secondary | ICD-10-CM | POA: Insufficient documentation

## 2021-04-11 DIAGNOSIS — R0602 Shortness of breath: Secondary | ICD-10-CM | POA: Insufficient documentation

## 2021-04-11 DIAGNOSIS — Z87891 Personal history of nicotine dependence: Secondary | ICD-10-CM | POA: Insufficient documentation

## 2021-04-11 DIAGNOSIS — I110 Hypertensive heart disease with heart failure: Secondary | ICD-10-CM | POA: Insufficient documentation

## 2021-04-11 DIAGNOSIS — Z20822 Contact with and (suspected) exposure to covid-19: Secondary | ICD-10-CM | POA: Diagnosis not present

## 2021-04-11 DIAGNOSIS — N179 Acute kidney failure, unspecified: Secondary | ICD-10-CM | POA: Diagnosis not present

## 2021-04-11 DIAGNOSIS — J439 Emphysema, unspecified: Secondary | ICD-10-CM | POA: Insufficient documentation

## 2021-04-11 DIAGNOSIS — Z86711 Personal history of pulmonary embolism: Secondary | ICD-10-CM

## 2021-04-11 DIAGNOSIS — I272 Pulmonary hypertension, unspecified: Secondary | ICD-10-CM | POA: Diagnosis not present

## 2021-04-11 DIAGNOSIS — Z8249 Family history of ischemic heart disease and other diseases of the circulatory system: Secondary | ICD-10-CM | POA: Insufficient documentation

## 2021-04-11 DIAGNOSIS — R42 Dizziness and giddiness: Secondary | ICD-10-CM | POA: Diagnosis not present

## 2021-04-11 DIAGNOSIS — I2782 Chronic pulmonary embolism: Secondary | ICD-10-CM | POA: Insufficient documentation

## 2021-04-11 DIAGNOSIS — I130 Hypertensive heart and chronic kidney disease with heart failure and stage 1 through stage 4 chronic kidney disease, or unspecified chronic kidney disease: Secondary | ICD-10-CM | POA: Diagnosis not present

## 2021-04-11 DIAGNOSIS — J449 Chronic obstructive pulmonary disease, unspecified: Secondary | ICD-10-CM

## 2021-04-11 DIAGNOSIS — I5033 Acute on chronic diastolic (congestive) heart failure: Secondary | ICD-10-CM | POA: Insufficient documentation

## 2021-04-11 DIAGNOSIS — Z7901 Long term (current) use of anticoagulants: Secondary | ICD-10-CM | POA: Insufficient documentation

## 2021-04-11 DIAGNOSIS — I517 Cardiomegaly: Secondary | ICD-10-CM | POA: Diagnosis not present

## 2021-04-11 LAB — BASIC METABOLIC PANEL
Anion gap: 6 (ref 5–15)
BUN: 33 mg/dL — ABNORMAL HIGH (ref 8–23)
CO2: 32 mmol/L (ref 22–32)
Calcium: 8.9 mg/dL (ref 8.9–10.3)
Chloride: 101 mmol/L (ref 98–111)
Creatinine, Ser: 2.23 mg/dL — ABNORMAL HIGH (ref 0.61–1.24)
GFR, Estimated: 29 mL/min — ABNORMAL LOW (ref >60)
Glucose, Bld: 91 mg/dL (ref 70–99)
Potassium: 4.7 mmol/L (ref 3.5–5.1)
Sodium: 139 mmol/L (ref 135–145)

## 2021-04-11 LAB — BRAIN NATRIURETIC PEPTIDE: B Natriuretic Peptide: 1737.2 pg/mL — ABNORMAL HIGH (ref 0.0–100.0)

## 2021-04-11 MED ORDER — MIDODRINE HCL 2.5 MG PO TABS: 2.5000 mg | ORAL_TABLET | Freq: Three times a day (TID) | ORAL | 3 refills | Status: DC

## 2021-04-11 MED ORDER — FUROSEMIDE 10 MG/ML IJ SOLN
80.0000 mg | INTRAMUSCULAR | 0 refills | Status: DC | PRN
  Filled 2021-04-11 (×2): qty 16, 2d supply, fill #0

## 2021-04-11 NOTE — Progress Notes (Signed)
ReDS Vest / Clip - 04/11/21 1000       ReDS Vest / Clip   Station Marker C    Ruler Value 25    ReDS Value Range High volume overload    ReDS Actual Value 46

## 2021-04-11 NOTE — Patient Instructions (Signed)
Your home health nurse will give you 20m of Lasix via IV for two days With every dose of Lasix take an additional 40 meq of potassium  HOLD Torsemide today and tomorrow-your will receive your diuretic via IV. RESUME normal dose of torsemide on Friday 04/13/21  START Midodrine 2.5 mg, one tab three times daily  Your physician recommends that you schedule a follow-up appointment in: 1 week  in the [facility]NP[facility] and in 6-8 weeks with Dr MAundra Dubin   [facility] and your health needs are our priority. As part of our continuing mission to provide you with exceptional heart care, we have created designated Provider Care Teams. These Care Teams include your primary Cardiologist (physician) and Advanced Practice Providers (APPs- Physician Assistants and Nurse Practitioners) who all work together to provide you with the care you need, when you need it.   You may see any of the following providers on your designated Care Team at your next follow up: Dr DGlori BickersDr DLoralie ChampagneDr BPatrice Paradise NP BLyda Jester PUtahJGinnie SmartLAudry Riles PharmD   Please be sure to bring in all your medications bottles to every appointment.   If you have any questions or concerns before your next appointment please send [place] message through [facility] call our office at [phone]    TO LEAVE A MESSAGE FOR THE NURSE SELECT OPTION 2, PLEASE LEAVE A MESSAGE INCLUDING: YOUR NAME DATE OF BIRTH CALL BACK NUMBER REASON FOR CALL**this is important as we prioritize the call backs  YOU WILL RECEIVE A CALL BACK THE SAME DAY AS LONG AS YOU CALL BEFORE 4:00 PM

## 2021-04-12 ENCOUNTER — Encounter (HOSPITAL_COMMUNITY): Payer: Self-pay | Admitting: Emergency Medicine

## 2021-04-12 ENCOUNTER — Emergency Department (HOSPITAL_COMMUNITY): Payer: Medicare Other

## 2021-04-12 ENCOUNTER — Inpatient Hospital Stay (HOSPITAL_COMMUNITY)
Admission: EM | Admit: 2021-04-12 | Discharge: 2021-04-17 | DRG: 291 | Disposition: A | Discharge status: Home Health | Payer: Medicare Other | Attending: Family Medicine | Admitting: Family Medicine

## 2021-04-12 ENCOUNTER — Telehealth (HOSPITAL_COMMUNITY): Payer: Self-pay | Admitting: Cardiology

## 2021-04-12 ENCOUNTER — Other Ambulatory Visit: Payer: Self-pay

## 2021-04-12 DIAGNOSIS — I2699 Other pulmonary embolism without acute cor pulmonale: Secondary | ICD-10-CM | POA: Diagnosis not present

## 2021-04-12 DIAGNOSIS — N179 Acute kidney failure, unspecified: Secondary | ICD-10-CM | POA: Diagnosis present

## 2021-04-12 DIAGNOSIS — Z7901 Long term (current) use of anticoagulants: Secondary | ICD-10-CM

## 2021-04-12 DIAGNOSIS — Z20822 Contact with and (suspected) exposure to covid-19: Secondary | ICD-10-CM | POA: Diagnosis present

## 2021-04-12 DIAGNOSIS — Z79899 Other long term (current) drug therapy: Secondary | ICD-10-CM | POA: Diagnosis not present

## 2021-04-12 DIAGNOSIS — I130 Hypertensive heart and chronic kidney disease with heart failure and stage 1 through stage 4 chronic kidney disease, or unspecified chronic kidney disease: Principal | ICD-10-CM | POA: Diagnosis present

## 2021-04-12 DIAGNOSIS — Z8249 Family history of ischemic heart disease and other diseases of the circulatory system: Secondary | ICD-10-CM

## 2021-04-12 DIAGNOSIS — K219 Gastro-esophageal reflux disease without esophagitis: Secondary | ICD-10-CM | POA: Diagnosis present

## 2021-04-12 DIAGNOSIS — J449 Chronic obstructive pulmonary disease, unspecified: Secondary | ICD-10-CM | POA: Diagnosis not present

## 2021-04-12 DIAGNOSIS — G4733 Obstructive sleep apnea (adult) (pediatric): Secondary | ICD-10-CM | POA: Diagnosis present

## 2021-04-12 DIAGNOSIS — R338 Other retention of urine: Secondary | ICD-10-CM | POA: Diagnosis present

## 2021-04-12 DIAGNOSIS — I272 Pulmonary hypertension, unspecified: Secondary | ICD-10-CM | POA: Diagnosis present

## 2021-04-12 DIAGNOSIS — I5082 Biventricular heart failure: Secondary | ICD-10-CM | POA: Diagnosis present

## 2021-04-12 DIAGNOSIS — N138 Other obstructive and reflux uropathy: Secondary | ICD-10-CM | POA: Diagnosis present

## 2021-04-12 DIAGNOSIS — E876 Hypokalemia: Secondary | ICD-10-CM | POA: Diagnosis not present

## 2021-04-12 DIAGNOSIS — I110 Hypertensive heart disease with heart failure: Secondary | ICD-10-CM | POA: Diagnosis not present

## 2021-04-12 DIAGNOSIS — I50812 Chronic right heart failure: Secondary | ICD-10-CM | POA: Diagnosis not present

## 2021-04-12 DIAGNOSIS — I951 Orthostatic hypotension: Secondary | ICD-10-CM | POA: Diagnosis present

## 2021-04-12 DIAGNOSIS — Z87891 Personal history of nicotine dependence: Secondary | ICD-10-CM | POA: Diagnosis not present

## 2021-04-12 DIAGNOSIS — I50810 Right heart failure, unspecified: Secondary | ICD-10-CM

## 2021-04-12 DIAGNOSIS — J439 Emphysema, unspecified: Secondary | ICD-10-CM | POA: Diagnosis present

## 2021-04-12 DIAGNOSIS — R0602 Shortness of breath: Secondary | ICD-10-CM | POA: Diagnosis not present

## 2021-04-12 DIAGNOSIS — I517 Cardiomegaly: Secondary | ICD-10-CM | POA: Diagnosis not present

## 2021-04-12 DIAGNOSIS — Z8672 Personal history of thrombophlebitis: Secondary | ICD-10-CM

## 2021-04-12 DIAGNOSIS — N1832 Chronic kidney disease, stage 3b: Secondary | ICD-10-CM | POA: Diagnosis present

## 2021-04-12 DIAGNOSIS — I509 Heart failure, unspecified: Secondary | ICD-10-CM | POA: Diagnosis not present

## 2021-04-12 DIAGNOSIS — I2782 Chronic pulmonary embolism: Secondary | ICD-10-CM | POA: Diagnosis present

## 2021-04-12 DIAGNOSIS — I5032 Chronic diastolic (congestive) heart failure: Secondary | ICD-10-CM | POA: Diagnosis not present

## 2021-04-12 DIAGNOSIS — N401 Enlarged prostate with lower urinary tract symptoms: Secondary | ICD-10-CM | POA: Diagnosis present

## 2021-04-12 DIAGNOSIS — J9611 Chronic respiratory failure with hypoxia: Secondary | ICD-10-CM | POA: Diagnosis present

## 2021-04-12 DIAGNOSIS — I50813 Acute on chronic right heart failure: Secondary | ICD-10-CM | POA: Diagnosis not present

## 2021-04-12 DIAGNOSIS — I2724 Chronic thromboembolic pulmonary hypertension: Secondary | ICD-10-CM | POA: Diagnosis present

## 2021-04-12 DIAGNOSIS — I5033 Acute on chronic diastolic (congestive) heart failure: Secondary | ICD-10-CM | POA: Diagnosis present

## 2021-04-12 DIAGNOSIS — N189 Chronic kidney disease, unspecified: Secondary | ICD-10-CM

## 2021-04-12 DIAGNOSIS — Z96652 Presence of left artificial knee joint: Secondary | ICD-10-CM | POA: Diagnosis present

## 2021-04-12 DIAGNOSIS — J811 Chronic pulmonary edema: Secondary | ICD-10-CM | POA: Diagnosis not present

## 2021-04-12 LAB — CBC WITH DIFFERENTIAL/PLATELET
Abs Immature Granulocytes: 0.01 10*3/uL (ref 0.00–0.07)
Basophils Absolute: 0 10*3/uL (ref 0.0–0.1)
Basophils Relative: 1 %
Eosinophils Absolute: 0.1 10*3/uL (ref 0.0–0.5)
Eosinophils Relative: 2 %
HCT: 47.1 % (ref 39.0–52.0)
Hemoglobin: 15 g/dL (ref 13.0–17.0)
Immature Granulocytes: 0 %
Lymphocytes Relative: 15 %
Lymphs Abs: 0.6 10*3/uL — ABNORMAL LOW (ref 0.7–4.0)
MCH: 28.5 pg (ref 26.0–34.0)
MCHC: 31.8 g/dL (ref 30.0–36.0)
MCV: 89.4 fL (ref 80.0–100.0)
Monocytes Absolute: 0.4 10*3/uL (ref 0.1–1.0)
Monocytes Relative: 11 %
Neutro Abs: 2.8 10*3/uL (ref 1.7–7.7)
Neutrophils Relative %: 71 %
Platelets: 161 10*3/uL (ref 150–400)
RBC: 5.27 MIL/uL (ref 4.22–5.81)
RDW: 16.4 % — ABNORMAL HIGH (ref 11.5–15.5)
WBC: 3.9 10*3/uL — ABNORMAL LOW (ref 4.0–10.5)
nRBC: 0 % (ref 0.0–0.2)

## 2021-04-12 LAB — RESP PANEL BY RT-PCR (FLU A&B, COVID) ARPGX2
Influenza A by PCR: NEGATIVE
Influenza B by PCR: NEGATIVE
SARS Coronavirus 2 by RT PCR: NEGATIVE

## 2021-04-12 LAB — BASIC METABOLIC PANEL
Anion gap: 7 (ref 5–15)
BUN: 33 mg/dL — ABNORMAL HIGH (ref 8–23)
CO2: 30 mmol/L (ref 22–32)
Calcium: 9.1 mg/dL (ref 8.9–10.3)
Chloride: 100 mmol/L (ref 98–111)
Creatinine, Ser: 2.13 mg/dL — ABNORMAL HIGH (ref 0.61–1.24)
GFR, Estimated: 30 mL/min — ABNORMAL LOW (ref >60)
Glucose, Bld: 107 mg/dL — ABNORMAL HIGH (ref 70–99)
Potassium: 4.9 mmol/L (ref 3.5–5.1)
Sodium: 137 mmol/L (ref 135–145)

## 2021-04-12 LAB — BRAIN NATRIURETIC PEPTIDE: B Natriuretic Peptide: 1597.1 pg/mL — ABNORMAL HIGH (ref 0.0–100.0)

## 2021-04-12 MED ORDER — ONDANSETRON HCL 4 MG/2ML IJ SOLN: 4.0000 mg | Freq: Four times a day (QID) | INTRAMUSCULAR | Status: DC | PRN

## 2021-04-12 MED ORDER — MIDODRINE HCL 5 MG PO TABS
2.5000 mg | ORAL_TABLET | Freq: Three times a day (TID) | ORAL | Status: DC
  Administered 2021-04-13 – 2021-04-16 (×11): 2.5 mg via ORAL
  Filled 2021-04-12 (×11): qty 1

## 2021-04-12 MED ORDER — RIOCIGUAT 1.5 MG PO TABS
1.5000 mg | ORAL_TABLET | Freq: Three times a day (TID) | ORAL | Status: DC
  Administered 2021-04-13 – 2021-04-17 (×12): 1.5 mg via ORAL
  Filled 2021-04-12 (×13): qty 1

## 2021-04-12 MED ORDER — SODIUM CHLORIDE 0.9 % IV SOLN: 250.0000 mL | INTRAVENOUS | Status: DC | PRN

## 2021-04-12 MED ORDER — UMECLIDINIUM BROMIDE 62.5 MCG/INH IN AEPB
1.0000 | INHALATION_SPRAY | Freq: Every day | RESPIRATORY_TRACT | Status: DC
  Administered 2021-04-13 – 2021-04-17 (×5): 1 via RESPIRATORY_TRACT
  Filled 2021-04-12: qty 7

## 2021-04-12 MED ORDER — TAMSULOSIN HCL 0.4 MG PO CAPS
0.4000 mg | ORAL_CAPSULE | Freq: Every day | ORAL | Status: DC
  Administered 2021-04-13 – 2021-04-16 (×4): 0.4 mg via ORAL
  Filled 2021-04-12 (×4): qty 1

## 2021-04-12 MED ORDER — FUROSEMIDE 10 MG/ML IJ SOLN
80.0000 mg | Freq: Two times a day (BID) | INTRAMUSCULAR | Status: DC
  Administered 2021-04-12 – 2021-04-15 (×7): 80 mg via INTRAVENOUS
  Filled 2021-04-12 (×7): qty 8

## 2021-04-12 MED ORDER — ALLOPURINOL 100 MG PO TABS
100.0000 mg | ORAL_TABLET | Freq: Every morning | ORAL | Status: DC
  Administered 2021-04-13 – 2021-04-17 (×5): 100 mg via ORAL
  Filled 2021-04-12 (×5): qty 1

## 2021-04-12 MED ORDER — AZELASTINE HCL 0.1 % NA SOLN: 2.0000 | Freq: Every day | NASAL | Status: DC

## 2021-04-12 MED ORDER — SODIUM CHLORIDE 0.9% FLUSH: 3.0000 mL | INTRAVENOUS | Status: DC | PRN

## 2021-04-12 MED ORDER — MAGNESIUM OXIDE -MG SUPPLEMENT 400 (240 MG) MG PO TABS
400.0000 mg | ORAL_TABLET | Freq: Every morning | ORAL | Status: DC
  Administered 2021-04-13 – 2021-04-17 (×5): 400 mg via ORAL
  Filled 2021-04-12 (×5): qty 1

## 2021-04-12 MED ORDER — PANTOPRAZOLE SODIUM 40 MG PO TBEC
40.0000 mg | DELAYED_RELEASE_TABLET | Freq: Every morning | ORAL | Status: DC
  Administered 2021-04-13 – 2021-04-17 (×5): 40 mg via ORAL
  Filled 2021-04-12 (×5): qty 1

## 2021-04-12 MED ORDER — APIXABAN 5 MG PO TABS
5.0000 mg | ORAL_TABLET | Freq: Two times a day (BID) | ORAL | Status: DC
  Administered 2021-04-12 – 2021-04-17 (×10): 5 mg via ORAL
  Filled 2021-04-12 (×10): qty 1

## 2021-04-12 MED ORDER — ACETAMINOPHEN 325 MG PO TABS: 650.0000 mg | ORAL_TABLET | ORAL | Status: DC | PRN

## 2021-04-12 MED ORDER — ARFORMOTEROL TARTRATE 15 MCG/2ML IN NEBU
15.0000 ug | INHALATION_SOLUTION | Freq: Two times a day (BID) | RESPIRATORY_TRACT | Status: DC
  Administered 2021-04-13 – 2021-04-17 (×9): 15 ug via RESPIRATORY_TRACT
  Filled 2021-04-12 (×11): qty 2

## 2021-04-12 MED ORDER — FINASTERIDE 5 MG PO TABS
5.0000 mg | ORAL_TABLET | Freq: Every day | ORAL | Status: DC
  Administered 2021-04-12 – 2021-04-16 (×5): 5 mg via ORAL
  Filled 2021-04-12 (×5): qty 1

## 2021-04-12 MED ORDER — SODIUM CHLORIDE 0.9% FLUSH
3.0000 mL | Freq: Two times a day (BID) | INTRAVENOUS | Status: DC
  Administered 2021-04-12 – 2021-04-17 (×8): 3 mL via INTRAVENOUS

## 2021-04-12 MED ORDER — GABAPENTIN 100 MG PO CAPS
100.0000 mg | ORAL_CAPSULE | Freq: Three times a day (TID) | ORAL | Status: DC
  Administered 2021-04-12 – 2021-04-17 (×14): 100 mg via ORAL
  Filled 2021-04-12 (×14): qty 1

## 2021-04-12 NOTE — H&P (Signed)
History and Physical    Joshua [name] CNO:[id number] DOB: 01-07-1936 DOA: 04/12/2021  PCP: [name], DO (Confirm with patient/family/NH records and if not entered, this has to be entered at [facility] point of entry) Patient coming from: Home  I have personally briefly reviewed patient's old medical records in [place]  Chief Complaint: Leg swelling  HPI: Joshua [name] is a 85 y.o. male with medical history significant of chronic diastolic CHF, chronic right-sided CHF, hypotension on midodrine, COPD, BPH, CKD stage II, PE on Eliquis, presented with worsening of bilateral leg swelling.  Patient has had refractory right-sided CHF, has been following with cardiologist, despite being on Lasix, patient gained> 20 pounds in last 4 to 5 weeks.  Yesterday, patient went to see cardiologist, and received 1 dose of Lasix injection, and sent to ED for further management. patient denied any chest pain, no shortness of breath no cough no fever chills.  Denies any urinary symptoms or diarrhea. ED Course: Blood pressure borderline low, blood work showed worsening of kidney function creatinine 2.1, K4.9.  Chest x-ray negative for acute infiltrates.  Review of Systems: As per HPI otherwise 14 point review of systems negative.    Past Medical History:  Diagnosis Date   Arthritis    BPH (benign prostatic hyperplasia)    COPD (chronic obstructive pulmonary disease) (HCC)    Folliculitis    posterior scalp per office visit note of Dr Maudie Mercury 07/20/2014     GERD (gastroesophageal reflux disease)    Hypertension    PE (pulmonary thromboembolism) ([place])    Phlebitis    right arm  at least 20 years ago    Pneumonia    hx of pneumonia as a child    Sleep apnea     Past Surgical History:  Procedure Laterality Date   BIOPSY  11/03/2020   Procedure: BIOPSY;  Surgeon: Carol Ada, MD;  Location: [facility];  Service: Endoscopy;;   bone removed from little toe right foot      CHOLECYSTECTOMY      ESOPHAGOGASTRODUODENOSCOPY (EGD) WITH PROPOFOL N/A 11/03/2020   Procedure: ESOPHAGOGASTRODUODENOSCOPY (EGD) WITH PROPOFOL;  Surgeon: Carol Ada, MD;  Location: [facility];  Service: Endoscopy;  Laterality: N/A;   pilonidal cyst removal      RIGHT HEART CATH N/A 12/13/2020   Procedure: RIGHT HEART CATH;  Surgeon: Larey Dresser, MD;  Location: [place] [facility];  Service: Cardiovascular;  Laterality: N/A;   TOTAL KNEE ARTHROPLASTY Left 09/13/2014   Procedure: LEFT TOTAL KNEE ARTHROPLASTY;  Surgeon: Mauri Pole, MD;  Location: [facility];  Service: Orthopedics;  Laterality: Left;     reports that he quit smoking about 14 years ago. His smoking use included cigarettes. He started smoking about 66 years ago. He has a 50.00 pack-year smoking history. He has never used smokeless tobacco. He reports current alcohol use of about 6.0 standard drinks per week. He reports that he does not use drugs.  Allergies  Allergen Reactions   Other Swelling    Beer- Swelling    Sunflower Oil Swelling   Sulfa Antibiotics Rash    Family History  Problem Relation Age of Onset   Heart attack Brother 31     Prior to Admission medications   Medication Sig Start Date End Date Taking? Authorizing Provider  ADEMPAS 2 MG TABS Take 2 mg by mouth in the morning and at bedtime. 04/05/21  Yes [provider]  allopurinol (ZYLOPRIM) 100 MG tablet Take 100 mg  by mouth every morning.   Yes [provider]  apixaban (ELIQUIS) 5 MG TABS tablet Take 1 tablet (5 mg total) by mouth 2 (two) times daily. 02/28/21  Yes Larey Dresser, MD  Azelastine HCl 0.15 % SOLN Place 2 sprays into the nose daily as needed (allergies). 02/22/21  Yes [provider]  Cinnamon 500 MG capsule Take 500 mg by mouth every morning.   Yes [provider]  doxazosin (CARDURA) 4 MG tablet Take 4 mg by mouth daily.   Yes [provider]  finasteride (PROSCAR) 5 MG tablet Take 5 mg by mouth at bedtime.   Yes  [provider]  gabapentin (NEURONTIN) 100 MG capsule Take 200 mg by mouth 3 (three) times daily. 11/27/20  Yes [provider]  ibuprofen (ADVIL) 200 MG tablet Take 400 mg by mouth daily as needed for headache (pain).   Yes [provider]  ipratropium (ATROVENT) 0.03 % nasal spray Place 1 spray into both nostrils daily as needed (allergies). 03/29/21  Yes [provider]  magnesium oxide (MAG-OX) 400 (240 Mg) MG tablet Take 800 mg by mouth daily. 02/10/21  Yes [provider]  midodrine (PROAMATINE) 2.5 MG tablet Take 1 tablet (2.5 mg total) by mouth 3 (three) times daily with meals. 04/11/21  Yes [place], Maricela Bo, FNP  pantoprazole (PROTONIX) 40 MG tablet Take 40 mg by mouth every morning.   Yes [provider]  potassium chloride (KLOR-CON) 10 MEQ tablet Take 2 tablets (20 mEq total) by mouth 2 (two) times daily. 02/28/21  Yes Larey Dresser, MD  tamsulosin (FLOMAX) 0.4 MG CAPS capsule Take 1 capsule (0.4 mg total) by mouth daily after supper. 02/28/21  Yes Larey Dresser, MD  Tiotropium Bromide Monohydrate (SPIRIVA RESPIMAT) 1.25 MCG/ACT AERS Inhale 2 puffs into the lungs every evening.   Yes [provider]  torsemide (DEMADEX) 20 MG tablet Take 3 tablets (60 mg total) by mouth 2 (two) times daily. Take one extra tablet ( 20 mg ) for extra 3 lbs in one day. 02/28/21  Yes Larey Dresser, MD    Physical Exam: Vitals:   04/12/21 1615 04/12/21 1630 04/12/21 1645 04/12/21 1715  BP: 108/67 105/71 121/77 112/68  Pulse: 80 78 80   Resp: _0 Temp:      TempSrc:      SpO2:   100% 100%    Constitutional: NAD, calm, comfortable Vitals:   04/12/21 1615 04/12/21 1630 04/12/21 1645 04/12/21 1715  BP: 108/67 105/71 121/77 112/68  Pulse: 80 78 80   Resp: _1 Temp:      TempSrc:      SpO2:   100% 100%   Eyes: PERRL, lids and conjunctivae normal ENMT: Mucous membranes are moist. Posterior pharynx clear of any  exudate or lesions.Normal dentition.  Neck: normal, supple, no masses, no thyromegaly Respiratory: clear to auscultation bilaterally, no wheezing, no crackles. Normal respiratory effort. No accessory muscle use.  Cardiovascular: Regular rate and rhythm, no murmurs / rubs / gallops. 2+ extremity edema to the level of mid thigh. 2+ pedal pulses. No carotid bruits.  Abdomen: no tenderness, no masses palpated. No hepatosplenomegaly. Bowel sounds positive.  Musculoskeletal: no clubbing / cyanosis. No joint deformity upper and lower extremities. Good ROM, no contractures. Normal muscle tone.  Skin: no rashes, lesions, ulcers. No induration Neurologic: CN 2-12 grossly intact. Sensation intact, DTR normal. Strength 5/5 in all 4.  Psychiatric: Normal judgment  and insight. Alert and oriented x 3. Normal mood.     Labs on Admission: I have personally reviewed following labs and imaging studies  CBC: Recent Labs  Lab 04/12/21 1435  WBC 3.9*  NEUTROABS 2.8  HGB 15.0  HCT 47.1  MCV 89.4  PLT 253   Basic Metabolic Panel: Recent Labs  Lab 04/11/21 1047 04/12/21 1435  NA 139 137  K 4.7 4.9  CL 101 100  CO2 32 30  GLUCOSE 91 107*  BUN 33* 33*  CREATININE 2.23* 2.13*  CALCIUM 8.9 9.1   GFR: Estimated Creatinine Clearance: 35.5 mL/min (A) (by C-G formula based on SCr of 2.13 mg/dL (H)). Liver Function Tests: No results for input(s): AST, ALT, ALKPHOS, BILITOT, PROT, ALBUMIN in the last 168 hours. No results for input(s): LIPASE, AMYLASE in the last 168 hours. No results for input(s): AMMONIA in the last 168 hours. Coagulation Profile: No results for input(s): INR, PROTIME in the last 168 hours. Cardiac Enzymes: No results for input(s): CKTOTAL, CKMB, CKMBINDEX, TROPONINI in the last 168 hours. BNP (last 3 results) No results for input(s): PROBNP in the last 8760 hours. HbA1C: No results for input(s): HGBA1C in the last 72 hours. CBG: No results for input(s): GLUCAP in the last 168  hours. Lipid Profile: No results for input(s): CHOL, HDL, LDLCALC, TRIG, CHOLHDL, LDLDIRECT in the last 72 hours. Thyroid Function Tests: No results for input(s): TSH, T4TOTAL, FREET4, T3FREE, THYROIDAB in the last 72 hours. Anemia Panel: No results for input(s): VITAMINB12, FOLATE, FERRITIN, TIBC, IRON, RETICCTPCT in the last 72 hours. Urine analysis:    Component Value Date/Time   COLORURINE YELLOW 08/24/2017 2138   APPEARANCEUR CLEAR 08/24/2017 2138   LABSPEC 1.020 08/24/2017 2138   PHURINE 5.0 08/24/2017 2138   GLUCOSEU NEGATIVE 08/24/2017 2138   HGBUR NEGATIVE 08/24/2017 2138   BILIRUBINUR NEGATIVE 08/24/2017 2138   KETONESUR 5 (A) 08/24/2017 2138   PROTEINUR 30 (A) 08/24/2017 2138   UROBILINOGEN 0.2 09/08/2014 0927   NITRITE NEGATIVE 08/24/2017 2138   LEUKOCYTESUR NEGATIVE 08/24/2017 2138    Radiological Exams on Admission: DG Chest 2 View  Result Date: 04/12/2021 CLINICAL DATA:  Shortness of breath EXAM: CHEST - 2 VIEW COMPARISON:  Chest radiograph 01/03/2021, chest CT 12/02/2020 FINDINGS: Unchanged, enlarged cardiac silhouette with prominent pulmonary arteries. There is no focal airspace consolidation. There are changes of emphysema. Prominent pulmonary vasculature. No large pleural effusion or visible pneumothorax. No acute osseous abnormality. Thoracic spondylosis IMPRESSION: Cardiomegaly with pulmonary vascular congestion. Electronically Signed   By: Maurine Simmering M.D.   On: 04/12/2021 15:11    EKG: Independently reviewed.  Chronic RBBB with secondary ST-T changes.  Assessment/Plan Principal Problem:   Acute on chronic right-sided heart failure (HCC) Active Problems:   Pulmonary HTN (HCC)   Acute on chronic diastolic (congestive) heart failure (HCC)   Acute kidney injury superimposed on CKD ([place])  (please populate well all problems here in Problem List. (For example, if patient is on BP meds at home and you resume or decide to hold them, it is a problem that needs to be  her. Same for CAD, COPD, HLD and so on)  Acute on chronic diastolic and right-sided CHF acute decompensation failed outpatient treatment -Refractory CHF -Agreed with IV Lasix twice daily -Blood pressure borderline low to add metolazone -Continue midodrine  AKI on CKD stage II -Fluid overload, concerns about cardiorenal syndrome -IV Lasix as above  History of hypotension -Continue midodrine  History of PE -Continue Eliquis  BPH -  Finasteride and Flomax  COPD -Stable, no symptoms signs of acute exacerbation  DVT prophylaxis: Eliquis Code Status: Full code Family Communication: None at bedside Disposition Plan: Expect more than 2 midnight hospital stay for aggressive diuresis to treat refractory CHF Consults called: Cardiology Admission status: Telemetry admission   Lequita Halt MD [facility] Pager [phone]  04/12/2021, 6:51 PM

## 2021-04-12 NOTE — Telephone Encounter (Signed)
Amy Anngrave,RN with [facility] called to report increased weakness and dizziness s/p 80 mg IV lasix 04/11/21  . BP 90/74 sitting 84/60 standing. Patient DID NOT pick up midodrine that was started 04/11/21. O2 sats 79% with minimal movement recovers to 93% on room air after rest. Lungs clear. HR tachy @ 94. Weight today 241 weight 244 04/11/21.  Pt is due for second dose of 80 mg IV lasix in the home today, ok to push?   Above reviewed with Darrick Grinder, NP and per Amy,NP pt should report to ER for further evaluation and management.    Amy,RN aware and appreciative of returned call.

## 2021-04-12 NOTE — ED Provider Notes (Signed)
Emergency Medicine Provider Triage Evaluation Note  Joshua [name] , a 85 y.o. male  was evaluated in triage.  Pt complains of fluid retention.  Review of Systems  Positive: Sob, increase weight gain, leg swelling Negative: Fever, chills, productive cough, cp  Physical Exam  BP 94/64 (BP Location: Left Arm)   Pulse 73   Temp 97.6 F (36.4 C) (Oral)   Resp 20   SpO2 94%  Gen:   Awake, no distress   Resp:  Normal effort  MSK:   Moves extremities without difficulty  Other:  Lungs with faint crackles bilaterally.  3+ pitting edema extending to thigh  Medical Decision Making  Medically screening exam initiated at 2:26 PM.  Appropriate orders placed.  Joshua [name] was informed that the remainder of the evaluation will be completed by another provider, this initial triage assessment does not replace that evaluation, and the importance of remaining in the ED until their evaluation is complete.  Pt with 20lbs weight gain x 1 month, increase sob and weakness.  Recently started on lasix.  Does wear home O2   Domenic Moras, PA-C 04/12/21 1428    Sherwood Gambler, MD 04/12/21 [phone]

## 2021-04-12 NOTE — ED Triage Notes (Signed)
Patient sent to [facility] for evaluation of lower extremity edema and twenty pound weight gain over the last month.

## 2021-04-12 NOTE — ED Provider Notes (Signed)
[facility] EMERGENCY DEPARTMENT Provider Note   CSN: [id number] Arrival date & time: 04/12/21  1400     History Chief Complaint  Patient presents with   Leg Swelling    AMMON MUSCATELLO is a 85 y.o. male.  LASON EVELAND is a 85 y.o. male with a history of HFpEF with RV failure, PE, COPD, pulmonary hypertension, chronic respiratory failure, hypertension, syncope, BPH, who presents to the emergency department for evaluation of lower extremity edema and weight gain.  Patient reports 20 pound weight gain over the past 3 to 4 weeks with worsening swelling in his legs.  He has been wearing boots to help with swelling.  He is followed by Dr. Marigene Ehlers with heart failure clinic.  Had home health come out yesterday to give him 80 mg of IV Lasix to help with fluid overload, when home health nurse came out today to give him another dose of IV Lasix they noticed that his systolic blood pressure was in the 80s-90s, and that with ambulation he was significantly more.  He does wear oxygen as needed home and has felt like he has been needing this more.  He was supposed to be restarted on midodrine but has not started taking.  He reports that he has had increasing shortness of breath in particular with activity and worsening swelling in his legs and in his abdomen.  He denies chest pain.  No cough or fever.  Reports prior to being started on IV Lasix through home health care he has been taking his torsemide regularly as directed and has not missed any doses.  Home health nurse called cardiology clinic due to worsening symptoms and for evaluation today and he was instructed to come to the emergency department  The history is provided by the patient and medical records.      Past Medical History:  Diagnosis Date   Arthritis    BPH (benign prostatic hyperplasia)    COPD (chronic obstructive pulmonary disease) (HCC)    Folliculitis    posterior scalp per office visit note of Dr Maudie Mercury  07/20/2014     GERD (gastroesophageal reflux disease)    Hypertension    PE (pulmonary thromboembolism) ([place])    Phlebitis    right arm  at least 20 years ago    Pneumonia    hx of pneumonia as a child    Sleep apnea     Patient Active Problem List   Diagnosis Date Noted   Acute on chronic diastolic (congestive) heart failure ([place]) 04/12/2021   Acute on chronic right-sided heart failure ([place]) 04/12/2021   Acute kidney injury superimposed on CKD ([place]) 04/12/2021   OSA (obstructive sleep apnea) 12/07/2020   Aortic atherosclerosis ([place]) 12/07/2020   CHF (congestive heart failure) ([place]) 12/03/2020   Syncope and collapse 12/03/2020   Chronic right-sided heart failure ([place]) 12/03/2020   Pulmonary HTN ([place]) 09/29/2017   BPH with urinary obstruction 08/25/2017   Syncope, vasovagal 08/25/2017   Demand ischemia of myocardium ([place]) 25/00/3704   Diastolic CHF ([place]) 88/89/1694   Essential hypertension 02/16/2015   COPD (chronic obstructive pulmonary disease) ([place]) 12/12/2014   History of pulmonary embolism 10/10/2014   Chronic respiratory failure with hypoxia ([place]) 10/10/2014   S/P knee replacement 09/13/2014    Past Surgical History:  Procedure Laterality Date   BIOPSY  11/03/2020   Procedure: BIOPSY;  Surgeon: Carol Ada, MD;  Location: [facility];  Service: Endoscopy;;   bone removed from little toe right foot  CHOLECYSTECTOMY     ESOPHAGOGASTRODUODENOSCOPY (EGD) WITH PROPOFOL N/A 11/03/2020   Procedure: ESOPHAGOGASTRODUODENOSCOPY (EGD) WITH PROPOFOL;  Surgeon: Carol Ada, MD;  Location: [facility];  Service: Endoscopy;  Laterality: N/A;   pilonidal cyst removal      RIGHT HEART CATH N/A 12/13/2020   Procedure: RIGHT HEART CATH;  Surgeon: Larey Dresser, MD;  Location: [place] [facility];  Service: Cardiovascular;  Laterality: N/A;   TOTAL KNEE ARTHROPLASTY Left 09/13/2014   Procedure: LEFT TOTAL KNEE ARTHROPLASTY;  Surgeon: Mauri Pole, MD;  Location: [facility];   Service: Orthopedics;  Laterality: Left;       Family History  Problem Relation Age of Onset   Heart attack Brother 75    Social History   Tobacco Use   Smoking status: Former    Packs/day: 1.00    Years: 50.00    Pack years: 50.00    Types: Cigarettes    Start date: 79    Quit date: 08/19/2006    Years since quitting: 14.6   Smokeless tobacco: Never  Vaping Use   Vaping Use: Never used  Substance Use Topics   Alcohol use: Yes    Alcohol/week: 6.0 standard drinks    Types: 6 Shots of liquor per week   Drug use: No    Home Medications Prior to Admission medications   Medication Sig Start Date End Date Taking? Authorizing Provider  allopurinol (ZYLOPRIM) 100 MG tablet Take 100 mg by mouth every morning.    [provider]  apixaban (ELIQUIS) 5 MG TABS tablet Take 1 tablet (5 mg total) by mouth 2 (two) times daily. 02/28/21   Larey Dresser, MD  azelastine (ASTELIN) 0.1 % nasal spray Place 2 sprays into both nostrils daily. Use in each nostril as directed 01/30/21   Martyn Ehrich, NP  Cinnamon 500 MG capsule Take 500 mg by mouth every morning.    [provider]  doxycycline (VIBRA-TABS) 100 MG tablet Take 100 mg by mouth 2 (two) times daily as needed (skin knot on back of neck). 12/14/18   [provider]  finasteride (PROSCAR) 5 MG tablet Take 5 mg by mouth at bedtime.    [provider]  furosemide (LASIX) 10 MG/ML injection Inject 8 mLs (80 mg total) into the vein as needed for up to 2 doses. 04/11/21   [place], Maricela Bo, FNP  gabapentin (NEURONTIN) 100 MG capsule Take 100 mg by mouth 3 (three) times daily. 11/27/20   [provider]  ibuprofen (ADVIL) 200 MG tablet Take 400 mg by mouth daily as needed for headache (pain).    [provider]  magnesium oxide (MAG-OX) 400 MG tablet Take 400 mg by mouth every morning. 11/14/20   [provider]  midodrine (PROAMATINE) 2.5 MG tablet Take 1 tablet (2.5 mg total)  by mouth 3 (three) times daily with meals. 04/11/21   Rafael Bihari, FNP  pantoprazole (PROTONIX) 40 MG tablet Take 40 mg by mouth every morning.    [provider]  potassium chloride (KLOR-CON) 10 MEQ tablet Take 2 tablets (20 mEq total) by mouth 2 (two) times daily. 02/28/21   Larey Dresser, MD  Riociguat (ADEMPAS) 1.5 MG TABS Take 1.5 mg by mouth in the morning, at noon, and at bedtime. 03/12/21   Larey Dresser, MD  tamsulosin (FLOMAX) 0.4 MG CAPS capsule Take 1 capsule (0.4 mg total) by mouth daily after supper. 02/28/21   Larey Dresser, MD  Tiotropium Bromide-Olodaterol (  STIOLTO RESPIMAT) 2.5-2.5 MCG/ACT AERS Inhale 2.5 mcg into the lungs in the morning and at bedtime. Patient taking differently: Inhale 2.5 mcg into the lungs daily. 01/30/21   Martyn Ehrich, NP  torsemide (DEMADEX) 20 MG tablet Take 3 tablets (60 mg total) by mouth 2 (two) times daily. Take one extra tablet ( 20 mg ) for extra 3 lbs in one day. 02/28/21   Larey Dresser, MD    Allergies    Other, Sunflower oil, and Sulfa antibiotics  Review of Systems   Review of Systems  Constitutional:  Positive for unexpected weight change. Negative for chills and fever.  Respiratory:  Positive for shortness of breath. Negative for cough.   Cardiovascular:  Positive for leg swelling. Negative for chest pain.  Gastrointestinal:  Negative for abdominal pain, nausea and vomiting.  Neurological:  Positive for light-headedness.  All other systems reviewed and are negative.  Physical Exam Updated Vital Signs BP 121/77   Pulse 80   Temp 97.6 F (36.4 C) (Oral)   Resp 16   SpO2 100%   Physical Exam Vitals and nursing note reviewed.  Constitutional:      General: He is not in acute distress.    Appearance: Normal appearance. He is well-developed. He is ill-appearing. He is not diaphoretic.     Comments: Chronically ill appearing, but in no acute distress  HENT:     Head: Normocephalic and atraumatic.      Mouth/Throat:     Mouth: Mucous membranes are moist.     Pharynx: Oropharynx is clear.  Eyes:     General:        Right eye: No discharge.        Left eye: No discharge.  Cardiovascular:     Rate and Rhythm: Normal rate and regular rhythm.     Pulses: Normal pulses.     Heart sounds: Normal heart sounds. No murmur heard.   No friction rub. No gallop.  Pulmonary:     Effort: Pulmonary effort is normal. No respiratory distress.     Breath sounds: Rales present. No wheezing.     Comments: On 2L [place] pt with normal work of breathing, no distress, some crackles in bilateral bases, no wheezing Abdominal:     General: Bowel sounds are normal. There is no distension.     Palpations: Abdomen is soft. There is no mass.     Tenderness: There is no abdominal tenderness. There is no guarding.     Comments: Abdomen soft, nondistended, nontender to palpation in all quadrants without guarding or peritoneal signs  Musculoskeletal:        General: No deformity.     Cervical back: Neck supple.     Right lower leg: Edema present.     Left lower leg: Edema present.     Comments: 2+ pitting edema to bilateral lower extremities up to the level of the knee, distal pulses 2+, extremities warm and well perfused  Skin:    General: Skin is warm and dry.     Capillary Refill: Capillary refill takes less than 2 seconds.  Neurological:     Mental Status: He is alert and oriented to person, place, and time.     Coordination: Coordination normal.     Comments: Speech is clear, able to follow commands Moves extremities without ataxia, coordination intact  Psychiatric:        Mood and Affect: Mood normal.        Behavior: Behavior normal.  ED Results / Procedures / Treatments   Labs (all labs ordered are listed, but only abnormal results are displayed) Labs Reviewed  BASIC METABOLIC PANEL - Abnormal; Notable for the following components:      Result Value   Glucose, Bld 107 (*)    BUN 33 (*)     Creatinine, Ser 2.13 (*)    GFR, Estimated 30 (*)    All other components within normal limits  CBC WITH DIFFERENTIAL/PLATELET - Abnormal; Notable for the following components:   WBC 3.9 (*)    RDW 16.4 (*)    Lymphs Abs 0.6 (*)    All other components within normal limits  BRAIN NATRIURETIC PEPTIDE - Abnormal; Notable for the following components:   B Natriuretic Peptide 1,597.1 (*)    All other components within normal limits  RESP PANEL BY RT-PCR (FLU A&B, COVID) [id number]    EKG EKG Interpretation  Date/Time:  Thursday April 12 2021 14:09:37 EDT Ventricular Rate:  94 PR Interval:  156 QRS Duration: 148 QT Interval:  406 QTC Calculation: 507 R Axis:   161 Text Interpretation: Sinus rhythm with Premature atrial complexes Right bundle branch block T wave abnormality, consider inferior ischemia  no significant change since Mar 21 2021 Confirmed by Sherwood Gambler [phone]) on 04/12/2021 2:40:40 PM Also confirmed by Sherwood Gambler [phone]), editor Hattie Perch [phone])  on 04/12/2021 2:46:39 PM  Radiology DG Chest 2 View  Result Date: 04/12/2021 CLINICAL DATA:  Shortness of breath EXAM: CHEST - 2 VIEW COMPARISON:  Chest radiograph 01/03/2021, chest CT 12/02/2020 FINDINGS: Unchanged, enlarged cardiac silhouette with prominent pulmonary arteries. There is no focal airspace consolidation. There are changes of emphysema. Prominent pulmonary vasculature. No large pleural effusion or visible pneumothorax. No acute osseous abnormality. Thoracic spondylosis IMPRESSION: Cardiomegaly with pulmonary vascular congestion. Electronically Signed   By: Maurine Simmering M.D.   On: 04/12/2021 15:11    Procedures Procedures   Medications Ordered in ED Medications  furosemide (LASIX) injection 80 mg (has no administration in time range)    ED Course  I have reviewed the triage vital signs and the nursing notes.  Pertinent labs & imaging results that were available during my care of the patient were  reviewed by me and considered in my medical decision making (see chart for details).    MDM Rules/Calculators/A&P                          85 year old male presents from home with worsening fluid overload, lower extremity swelling and 20 pound weight gain reported, some worsening shortness of breath with crackles noted on exam.  On arrival vitals are stable, patient on 4 L nasal cannula, I was able to decrease this to 2 L nasal cannula and he maintained normal O2 sats.  Patient had IV Lasix through home health care yesterday, but despite this was having some soft systolic blood pressures when home health care nurse arrived today, cardiology clinic recommended patient present to ED.  I have independently ordered, reviewed and interpreted all labs and imaging: CBC: Mild leukopenia, normal hemoglobin BMP: No significant electrolyte derangements, creatinine slightly increased at 2.13 with BUN of 33, labs look similar yesterday but previously his baseline creatinine was typically between 1.6-1.7 likely a slight bump in the setting of diuresis and fluid overload BNP: Elevated at 1597.1  Chest x-ray with cardiomegaly with pulmonary vascular congestion concerning for CHF exacerbation.  EKG shows sinus rhythm without significant changes when compared to  prior, patient not having any chest pain.  Case discussed with cardiology service, PA Ellen Henri with advanced heart failure team has seen and evaluated patient, they will consult on patient and make recommendations for diuresis dosing, but request medicine admission  Case discussed with Dr. Roosevelt Locks with [facility] hospitalist who will see and admit the patient.  Final Clinical Impression(s) / ED Diagnoses Final diagnoses:  Acute on chronic congestive heart failure, unspecified heart failure type [facility])    Rx / DC Orders ED Discharge Orders     None        Janet Berlin 04/12/21 1934    Sherwood Gambler, MD 04/13/21 [phone]

## 2021-04-12 NOTE — Progress Notes (Signed)
Orthopedic Tech Progress Note Patient Details:  Joshua [name] May 12, 1936 [id number]  Ortho Devices Type of Ortho Device: Louretta Parma boot Ortho Device/Splint Location: Bi LE Ortho Device/Splint Interventions: Application   Post Interventions Patient Tolerated: Well  [name] [name] 04/12/2021, 6:21 PM

## 2021-04-12 NOTE — Consult Note (Addendum)
Advanced Heart Failure Team Consult Note   Primary Physician: [name], DO PCP-Cardiologist:  Sherren Mocha, MD [facility]: Dr. Aundra Dubin   Reason for Consultation: Acute on Chronic Right Sided Heart Failure  HPI:    Joshua [name] is seen today for evaluation of Acute on chronic right sided heart failure at the request of Dr. Regenia Skeeter, Emergency Medicine.   85 y.o. with history of HFPEF, prior PE, and pulmonary hypertension and right sided heart failure.  Patient had a PE in 2016.  He is on apixaban. He has a long history of diastolic CHF.  Most recent echo 2/22 showed a significant component of RV failure with EF 55-60%, IV septum flattened, severe RV enlargement, severely decreased RV function, PASP 57 mmHg.  CTA chest in 4/22 showed no acute PE and mild emphysema, but V/Q scan in 5/22 was suggestive of chronic PE in the right middle lobe.  RHC in 4/22 showed normal filling pressures with moderate PAH. He additionally has chronic hiccups followed by Dr. Benson Norway, now on gabapentin. PFTs in 6/22 showed mixed picture with severe obstruction, moderate restriction, and severely decreased DLCO.  He is now on Adempas for his [place].   He also has had several syncopal spells. Zio monitor was placed to quantify any arrhythmia that could be responsible for his syncope.  Zio 14 day showed mostly SR, BBB/IVCD present, no major arrhythmias. Syncope may be 2/2 orthostatic hypotension. He has required midodrine.   He now presents to the ED w/ marked volume overload, 20 lb wt gain in the last month, increased SOB and weakness. Labs also show AKI. SCr elevated at 2.1 (baseline ~1.6). BNP 1,597. CXR shows cardiomegaly w/ pulmonary vascular congestion. EKG NSR w/ RBBB (chronic), 94 bpm, WBC 3.9, Hgb 15, Na 137, K 4.9, CO2 32, SCr 2.13. SBPs in ED low 90s-110s. NYHA Class II-III.     Echo 2/22 LVEF 55-60%. IV septum flattened, severe RV enlargement, severely decreased RV function, PASP 57 mmHg  Review of  Systems: [y] = yes, _0  = no   General: Weight gain [Y ]; Weight loss _1 ; Anorexia _2 ; Fatigue _3 ; Fever _4 ; Chills _5 ; Weakness _6   Cardiac: Chest pain/pressure _7 ; Resting SOB _8 ; Exertional SOB [ Y]; Orthopnea _9 ; Pedal Edema [ Y]; Palpitations _10 ; Syncope _11 ; Presyncope _12 ; Paroxysmal nocturnal dyspnea_13   Pulmonary: Cough _14 ; Wheezing_15 ; Hemoptysis_16 ; Sputum _17 ; Snoring _18   GI: Vomiting_19 ; Dysphagia_20 ; Melena_21 ; Hematochezia _22 ; Heartburn_23 ; Abdominal pain _24 ; Constipation _25 ; Diarrhea _26 ; BRBPR _27   GU: Hematuria_28 ; Dysuria _29 ; Nocturia_30   Vascular: Pain in legs with walking _31 ; Pain in feet with lying flat _32 ; Non-healing sores _33 ; Stroke _34 ; TIA _35 ; Slurred speech _36 ;  Neuro: Headaches_37 ; Vertigo_38 ; Seizures_39 ; Paresthesias_40 ;Blurred vision _41 ; Diplopia _42 ; Vision changes _43   Ortho/Skin: Arthritis _44 ; Joint pain _45 ; Muscle pain _46 ; Joint swelling _47 ; Back Pain _48 ; Rash _49   Psych: Depression_50 ; Anxiety_51   Heme: Bleeding problems _52 ; Clotting disorders _53 ; Anemia _54   Endocrine: Diabetes _55 ; Thyroid dysfunction_56   Home Medications Prior to Admission medications   Medication Sig Start Date End Date Taking? Authorizing Provider  allopurinol (ZYLOPRIM) 100 MG tablet Take 100 mg by mouth every morning.    [provider]  apixaban (ELIQUIS) 5 MG TABS tablet Take 1 tablet (5 mg total) by mouth 2 (two) times daily. 02/28/21   Larey Dresser, MD  azelastine (ASTELIN) 0.1 % nasal spray Place 2 sprays into both nostrils daily. Use in each nostril as directed 01/30/21   Martyn Ehrich, NP  Cinnamon 500 MG capsule Take 500 mg by mouth every morning.    [provider]  doxycycline (VIBRA-TABS) 100 MG tablet Take 100 mg by mouth 2 (two) times daily as needed (skin knot on back of neck). 12/14/18   [provider]  finasteride (PROSCAR) 5 MG tablet Take 5 mg by mouth at bedtime.    [provider]  furosemide  (LASIX) 10 MG/ML injection Inject 8 mLs (80 mg total) into the vein as needed for up to 2 doses. 04/11/21   [place], Maricela Bo, FNP  gabapentin (NEURONTIN) 100 MG capsule Take 100 mg by mouth 3 (three) times daily. 11/27/20   [provider]  ibuprofen (ADVIL) 200 MG tablet Take 400 mg by mouth daily as needed for headache (pain).    [provider]  magnesium oxide (MAG-OX) 400 MG tablet Take 400 mg by mouth every morning. 11/14/20   [provider]  midodrine (PROAMATINE) 2.5 MG tablet Take 1 tablet (2.5 mg total) by mouth 3 (three) times daily with meals. 04/11/21   Rafael Bihari, FNP  pantoprazole (PROTONIX) 40 MG tablet Take 40 mg by mouth every morning.    [provider]  potassium chloride (KLOR-CON) 10 MEQ tablet Take 2 tablets (20 mEq total) by mouth 2 (two) times daily. 02/28/21   Larey Dresser, MD  Riociguat (ADEMPAS) 1.5 MG TABS Take 1.5 mg by mouth in the morning, at noon, and at bedtime. 03/12/21   Larey Dresser, MD  tamsulosin (FLOMAX) 0.4 MG CAPS capsule Take 1 capsule (0.4 mg total) by mouth daily after supper. 02/28/21   Larey Dresser, MD  Tiotropium Bromide-Olodaterol (STIOLTO RESPIMAT) 2.5-2.5 MCG/ACT AERS Inhale 2.5 mcg into the lungs in the morning and at bedtime. Patient taking differently: Inhale 2.5 mcg into the lungs daily. 01/30/21   Martyn Ehrich, NP  torsemide (DEMADEX) 20 MG tablet Take 3 tablets (60 mg total) by mouth 2 (two) times daily. Take one extra tablet ( 20 mg ) for extra 3 lbs in one day. 02/28/21   Larey Dresser, MD    Past Medical History: Past Medical History:  Diagnosis Date   Arthritis    BPH (benign prostatic hyperplasia)    COPD (chronic obstructive pulmonary disease) ([place])    Folliculitis    posterior scalp per office visit note of Dr Maudie Mercury 07/20/2014     GERD (gastroesophageal reflux disease)    Hypertension    PE (pulmonary thromboembolism) ([place])    Phlebitis    right arm  at least 20 years ago     Pneumonia    hx of pneumonia as a child    Sleep apnea     Past Surgical History: Past Surgical History:  Procedure Laterality Date   BIOPSY  11/03/2020   Procedure: BIOPSY;  Surgeon: Carol Ada, MD;  Location: [facility];  Service: Endoscopy;;   bone removed from little toe right foot      CHOLECYSTECTOMY     ESOPHAGOGASTRODUODENOSCOPY (EGD) WITH PROPOFOL N/A 11/03/2020   Procedure: ESOPHAGOGASTRODUODENOSCOPY (EGD) WITH PROPOFOL;  Surgeon: Carol Ada, MD;  Location: [facility];  Service: Endoscopy;  Laterality: N/A;  pilonidal cyst removal      RIGHT HEART CATH N/A 12/13/2020   Procedure: RIGHT HEART CATH;  Surgeon: Larey Dresser, MD;  Location: [facility];  Service: Cardiovascular;  Laterality: N/A;   TOTAL KNEE ARTHROPLASTY Left 09/13/2014   Procedure: LEFT TOTAL KNEE ARTHROPLASTY;  Surgeon: Mauri Pole, MD;  Location: [facility];  Service: Orthopedics;  Laterality: Left;    Family History: Family History  Problem Relation Age of Onset   Heart attack Brother 29    Social History: Social History   Socioeconomic History   Marital status: Married    Spouse name: Not on file   Number of children: Not on file   Years of education: Not on file   Highest education level: Not on file  Occupational History   Not on file  Tobacco Use   Smoking status: Former    Packs/day: 1.00    Years: 50.00    Pack years: 50.00    Types: Cigarettes    Start date: 52    Quit date: 08/19/2006    Years since quitting: 14.6   Smokeless tobacco: Never  Vaping Use   Vaping Use: Never used  Substance and Sexual Activity   Alcohol use: Yes    Alcohol/week: 6.0 standard drinks    Types: 6 Shots of liquor per week   Drug use: No   Sexual activity: Not on file  Other Topics Concern   Not on file  Social History Narrative   Not on file   Social Determinants of Health   Financial Resource Strain: Not on file  Food Insecurity: Not on file  Transportation Needs: Not on  file  Physical Activity: Not on file  Stress: Not on file  Social Connections: Not on file    Allergies:  Allergies  Allergen Reactions   Other Swelling    Beer- Swelling    Sunflower Oil Swelling   Sulfa Antibiotics Rash    Objective:    Vital Signs:   Temp:  [97.6 F (36.4 C)] 97.6 F (36.4 C) (08/25 1407) Pulse Rate:  [73-91] 91 (08/25 1519) Resp:  [20-22] 22 (08/25 1519) BP: (94-111)/(64-76) 111/76 (08/25 1519) SpO2:  [94 %-100 %] 100 % (08/25 1519)    Weight change: There were no vitals filed for this visit.  Intake/Output:  No intake or output data in the 24 hours ending 04/12/21 1616    Physical Exam    General:  Well appearing elderly AAM. No resp difficulty HEENT: normal Neck: supple. JVP elevated to ear . Carotids 2+ bilat; no bruits. No lymphadenopathy or thyromegaly appreciated. Cor: PMI nondisplaced. Regular rate & rhythm. + TR murmur  Lungs: decreased BS at the bases, no crackles, no wheezing  Abdomen: edematous, nontender, nondistended. + hepatosplenomegaly. No bruits or masses. Good bowel sounds. Extremities: no cyanosis, clubbing, rash, 2-3+ bilateral LE edema Neuro: alert & orientedx3, cranial nerves grossly intact. moves all 4 extremities w/o difficulty. Affect pleasant   Telemetry   NSR w/ RBBB 70s bpm   EKG    NSR w/ chronic RBBB, 94 bpm   Labs   Basic Metabolic Panel: Recent Labs  Lab 04/11/21 1047 04/12/21 1435  NA 139 137  K 4.7 4.9  CL 101 100  CO2 32 30  GLUCOSE 91 107*  BUN 33* 33*  CREATININE 2.23* 2.13*  CALCIUM 8.9 9.1    Liver Function Tests: No results for input(s): AST, ALT, ALKPHOS, BILITOT, PROT, ALBUMIN in the last 168 hours. No  results for input(s): LIPASE, AMYLASE in the last 168 hours. No results for input(s): AMMONIA in the last 168 hours.  CBC: Recent Labs  Lab 04/12/21 1435  WBC 3.9*  NEUTROABS 2.8  HGB 15.0  HCT 47.1  MCV 89.4  PLT 161    Cardiac Enzymes: No results for input(s):  CKTOTAL, CKMB, CKMBINDEX, TROPONINI in the last 168 hours.  BNP: BNP (last 3 results) Recent Labs    03/21/21 1056 04/11/21 1047 04/12/21 1435  BNP 1,487.5* 1,737.2* 1,597.1*    ProBNP (last 3 results) No results for input(s): PROBNP in the last 8760 hours.   CBG: No results for input(s): GLUCAP in the last 168 hours.  Coagulation Studies: No results for input(s): LABPROT, INR in the last 72 hours.   Imaging   DG Chest 2 View  Result Date: 04/12/2021 CLINICAL DATA:  Shortness of breath EXAM: CHEST - 2 VIEW COMPARISON:  Chest radiograph 01/03/2021, chest CT 12/02/2020 FINDINGS: Unchanged, enlarged cardiac silhouette with prominent pulmonary arteries. There is no focal airspace consolidation. There are changes of emphysema. Prominent pulmonary vasculature. No large pleural effusion or visible pneumothorax. No acute osseous abnormality. Thoracic spondylosis IMPRESSION: Cardiomegaly with pulmonary vascular congestion. Electronically Signed   By: Maurine Simmering M.D.   On: 04/12/2021 15:11     Medications:     Current Medications:   Infusions:     Patient Profile   85 y/o male w/ HFpEF, severe RV failure in setting of WHO Group 3 + 4 PH, admitted for a/c Rt sided HF w/ markedly volume overload and AKI.   Assessment/Plan   1. Acute on Chronic HFpEF/RV failure: Echo (2/22) with EF 55-60%, IV septum flattened, severe RV enlargement, severely decreased RV function, PASP 57 mmHg.  RHC in 4/22 showed normal filling pressures. Now being admitted for a/c RV failure w/ markedly volume overload + poor response to home diuretics. NYHA Class II-III.  - IV Lasix 80 mg bid - Apply Unna Boots - Fluid restrict 1800 mL  - C/w midodrine for BP support  - Follow BMP  - If difficulties diuresing, may need inotropic support w/ milrinone  - suspect he will need to be discharged home w/ once weekly metolazone to keep fluid off  2. AKI on Stage III CKD: Baseline SCr ~1.6. SCr elevated at 2.1 on  admit, in setting of volume overload and RV failure - follow BMP w/ diuresis - continue midodrine for BP support, keep SBP >110 for renal perfusion 3. Pulmonary HTN: PAH noted on 4/22 RHC with PVR 6.1 WU.  This appears to be multifactorial with OSA, emphysema, hypoxemia with ambulation, and a suspected chronic PE involving the right middle lobe (group 3 and group 4 PH). Given the suspected mixed etiology with only 1 area of chronic thromboembolism (right middle lobe) as well as age, do not think that pulmonary thromboendarterectomy would be indicated.  Rheumatologic serologic workup was negative.  PFTs showed severe obstruction and moderate restriction, suggesting significant COPD. - Continue riociguat for CTEPH, 1.5 mg bid  - Continue w/ supp O2  4. OSA: untreated. Unable to tolerate CPAP  5. Emphysema: Prior smoker.  Emphysema on CT and severe obstruction on PFTs 6. Orthostatic Hypotension  - c/w midodrine + leg compression  7. Chronic PE - on Eliquis - denies bleeding  8. BPH - on Flomax - issues w/ urinary retention, often self caths 3-4x/day  - if difficulties voiding, may need placement of indwelling foley catheter     Length of Stay:  0  Lyda Jester, PA-C  04/12/2021, 4:16 PM  Advanced Heart Failure Team Pager [phone] (M-F; 7a - 5p)  Please contact [facility] for night-coverage after hours (4p -7a ) and weekends on [email or web address]  Patient seen and examined with the above-signed Advanced Practice Provider and/or Housestaff. I personally reviewed laboratory data, imaging studies and relevant notes. I independently examined the patient and formulated the important aspects of the plan. I have edited the note to reflect any of my changes or salient points. I have personally discussed the plan with the patient and/or family.  85 y/o male with PAH/RV failure (WHO Group 3, 4), CKD 3b presents with marked volume overload.  Says swelling has come on gradually. Initially below his  knees but now up into thighs and groin area. Had not responded to increased oral diuretics. Thinks weight is up 20-25 pounds. Denies SOB, orthopnea or PND. SCr 1.6 -> 2.1. BP ok on midodrine  Says he straight caths several times per day due to urinary retention but can pee some  General:  Elderly male. No resp difficulty HEENT: normal Neck: supple.JVP to ear with prominent v waves Carotids 2+ bilat; no bruits. No lymphadenopathy or thryomegaly appreciated. Cor: PMI nondisplaced. Regular rate & rhythm.2/6 TR Lungs: clear Abdomen: soft, nontender, + distended. No hepatosplenomegaly. No bruits or masses. Good bowel sounds. Extremities: no cyanosis, clubbing, rash, 3+ edema into thighs Neuro: alert & orientedx3, cranial nerves grossly intact. moves all 4 extremities w/o difficulty. Affect pleasant  He is markedly overloaded in setting of acute on chronic RV failure. Start IV lasix. Place UNNA boots. Suspect renal function will improve with renal decongestion. Low threshold to switch to lasix gtt and/or add milrinone.   Watch for urinary retention. He prefers to avoid a Foley, if possible.  Glori Bickers, MD  11:10 PM

## 2021-04-12 NOTE — Plan of Care (Signed)
  Problem: Education: Goal: Knowledge of General Education information will improve Description: Including pain rating scale, medication(s)/side effects and non-pharmacologic comfort measures Outcome: Progressing   Problem: Health Behavior/Discharge Planning: Goal: Ability to manage health-related needs will improve Outcome: Progressing   Problem: Clinical Measurements: Goal: Ability to maintain clinical measurements within normal limits will improve Outcome: Progressing   Problem: Clinical Measurements: Goal: Diagnostic test results will improve Outcome: Progressing   Problem: Clinical Measurements: Goal: Respiratory complications will improve Outcome: Progressing   Problem: Clinical Measurements: Goal: Cardiovascular complication will be avoided Outcome: Progressing   Problem: Activity: Goal: Risk for activity intolerance will decrease Outcome: Progressing   Problem: Pain Managment: Goal: General experience of comfort will improve Outcome: Progressing   Problem: Safety: Goal: Ability to remain free from injury will improve Outcome: Progressing   Problem: Education: Goal: Ability to demonstrate management of disease process will improve Outcome: Progressing   Problem: Education: Goal: Ability to verbalize understanding of medication therapies will improve Outcome: Progressing   Problem: Activity: Goal: Capacity to carry out activities will improve Outcome: Progressing   Problem: Cardiac: Goal: Ability to achieve and maintain adequate cardiopulmonary perfusion will improve Outcome: Progressing

## 2021-04-13 ENCOUNTER — Encounter (HOSPITAL_COMMUNITY): Payer: Self-pay | Admitting: Internal Medicine

## 2021-04-13 DIAGNOSIS — I50813 Acute on chronic right heart failure: Secondary | ICD-10-CM | POA: Diagnosis not present

## 2021-04-13 LAB — BASIC METABOLIC PANEL
Anion gap: 7 (ref 5–15)
BUN: 30 mg/dL — ABNORMAL HIGH (ref 8–23)
CO2: 32 mmol/L (ref 22–32)
Calcium: 9.1 mg/dL (ref 8.9–10.3)
Chloride: 100 mmol/L (ref 98–111)
Creatinine, Ser: 1.66 mg/dL — ABNORMAL HIGH (ref 0.61–1.24)
GFR, Estimated: 41 mL/min — ABNORMAL LOW (ref >60)
Glucose, Bld: 86 mg/dL (ref 70–99)
Potassium: 4 mmol/L (ref 3.5–5.1)
Sodium: 139 mmol/L (ref 135–145)

## 2021-04-13 MED ORDER — METOLAZONE 5 MG PO TABS
2.5000 mg | ORAL_TABLET | Freq: Once | ORAL | Status: AC
  Administered 2021-04-13: 2.5 mg via ORAL
  Filled 2021-04-13: qty 1

## 2021-04-13 NOTE — Evaluation (Signed)
Physical Therapy Evaluation Patient Details Name: Joshua [name] MRN: [id number] DOB: 09/30/1935 Today's Date: 04/13/2021   History of Present Illness  85 y.o. male  presented to ED with worsening of bilateral leg swelling. In ED found to have Blood pressure borderline low, blood work showed worsening of kidney function creatinine 2.1, K4.9. Admitted 04/12/21  for treatment of acute on chronic right-sided heaert failure.  PMH: with medical history significant of chronic diastolic CHF, chronic right-sided CHF, hypotension on midodrine, COPD, BPH, CKD stage II, PE on Eliquis,  Clinical Impression  PTA pt living with wife in multistory home with 4 steps to enter and bed/bath upstairs. Pt reports independence with gait in home and use of cane occasionally with community ambulation. Pt independent with iADLs. Pt currently limited in safe mobility by 3/4 DoE with ambulation and generalized weakness. Pt is currently mod I for bed mobility and supervision for transfers and ambulation without AD. Pt will have no further PT needs at discharge, however will continue to follow acutely to progress mobility towards PLOF.     Follow Up Recommendations No PT follow up;Supervision - Intermittent    Equipment Recommendations  None recommended by PT       Precautions / Restrictions Precautions Precautions: Fall Precaution Comments: Bilateral UNNA boots, is supposed to wear 2L O2 at home, however is not always compliant Restrictions Weight Bearing Restrictions: No      Mobility  Bed Mobility Overal bed mobility: Modified Independent             General bed mobility comments: HoB elevated, increased effort overall safe    Transfers Overall transfer level: Needs assistance Equipment used: None Transfers: Sit to/from Stand Sit to Stand: Supervision         General transfer comment: supervision for safety, denies dizziness or lightheadedness  Ambulation/Gait Ambulation/Gait assistance:  Supervision Gait Distance (Feet): 125 Feet Assistive device: None Gait Pattern/deviations: Step-through pattern;Shuffle;Trunk flexed Gait velocity: slowed Gait velocity interpretation: 1.31 - 2.62 ft/sec, indicative of limited community ambulator General Gait Details: supervision for safety with slow, steady, shuffling gait, vc for upright posturept with 3/4 DoE at end of ambulation recovers in 1-2 min SaO2> 90%O2        Balance Overall balance assessment: Mild deficits observed, not formally tested                                           Pertinent Vitals/Pain Pain Assessment: No/denies pain    Home Living Family/patient expects to be discharged to:: Private residence Living Arrangements: Spouse/significant other Available Help at Discharge: Family;Available 24 hours/day Type of Home: House Home Access: Stairs to enter Entrance Stairs-Rails: Right Entrance Stairs-Number of Steps: 4 Home Layout: Two level;Bed/bath upstairs Home Equipment: Cane - single point      Prior Function Level of Independence: Independent with assistive device(s)         Comments: uses cane in community     Hand Dominance   Dominant Hand: Right    Extremity/Trunk Assessment   Upper Extremity Assessment Upper Extremity Assessment: Overall WFL for tasks assessed    Lower Extremity Assessment Lower Extremity Assessment: Generalized weakness;RLE deficits/detail;LLE deficits/detail RLE Deficits / Details: LE edema, UNNA boot in place LLE Deficits / Details: LE edema, UNNA boot in place    Cervical / Trunk Assessment Cervical / Trunk Assessment: Kyphotic  Communication   Communication: No difficulties  Cognition Arousal/Alertness: Awake/alert Behavior During Therapy: WFL for tasks assessed/performed Overall Cognitive Status: Within Functional Limits for tasks assessed                                        General Comments General comments (skin  integrity, edema, etc.): Pt on 2L O2 via [place], SaO2 > 90%O2 when pleth wave form good during ambulation, at rest 96%O2, BP at rest 117/76, HR at rest 85, during activity high 110s        Assessment/Plan    PT Assessment Patient needs continued PT services  PT Problem List Decreased strength;Decreased activity tolerance       PT Treatment Interventions Gait training;Stair training;Functional mobility training;Therapeutic activities;Therapeutic exercise;Balance training;Patient/family education    PT Goals (Current goals can be found in the Care Plan section)  Acute Rehab PT Goals Patient Stated Goal: get back to grandson's football games PT Goal Formulation: With patient Time For Goal Achievement: 04/27/21 Potential to Achieve Goals: Good    Frequency Min 3X/week    AM-PAC PT "6 Clicks" Mobility  Outcome Measure Help needed turning from your back to your side while in a flat bed without using bedrails?: None Help needed moving from lying on your back to sitting on the side of a flat bed without using bedrails?: None Help needed moving to and from a bed to a chair (including a wheelchair)?: None Help needed standing up from a chair using your arms (e.g., wheelchair or bedside chair)?: None Help needed to walk in hospital room?: None Help needed climbing 3-5 steps with a railing? : A Little 6 Click Score: 23    End of Session Equipment Utilized During Treatment: Gait belt;Oxygen Activity Tolerance: Patient tolerated treatment well Patient left: in chair;with call bell/phone within reach Nurse Communication: Mobility status PT Visit Diagnosis: Muscle weakness (generalized) (M62.81)    Time: 0051-1021 PT Time Calculation (min) (ACUTE ONLY): 21 min   Charges:   PT Evaluation $PT Eval Moderate Complexity: 1 Mod          Francesco Provencal B. Migdalia Dk PT, DPT Acute Rehabilitation Services Pager [phone] Office [phone]   [place] 04/13/2021, 9:40  AM

## 2021-04-13 NOTE — Progress Notes (Signed)
PROGRESS NOTE    Joshua [name]  DJS:[id number] DOB: 1936/01/05 DOA: 04/12/2021 PCP: [name], DO   Chief Complaint  Patient presents with   Leg Swelling   Brief Narrative:  Joshua [name] is Joshua [name] 85 y.o. male with medical history significant of chronic diastolic CHF, chronic right-sided CHF, hypotension on midodrine, COPD, BPH, CKD stage II, PE on Eliquis, presented with worsening of bilateral leg swelling.   Patient has had refractory right-sided CHF, has been following with cardiologist, despite being on Lasix, patient gained> 20 pounds in last 4 to 5 weeks.  Yesterday, patient went to see cardiologist, and received 1 dose of Lasix injection, and sent to ED for further management. patient denied any chest pain, no shortness of breath no cough no fever chills.  Denies any urinary symptoms or diarrhea. ED Course: Blood pressure borderline low, blood work showed worsening of kidney function creatinine 2.1, K4.9.  Chest x-ray negative for acute infiltrates.   Assessment & Plan:   Principal Problem:   Acute on chronic right-sided heart failure (HCC) Active Problems:   Pulmonary HTN (HCC)   Acute on chronic diastolic (congestive) heart failure (HCC)   Acute kidney injury superimposed on CKD (HCC)  Acute on chronic diastolic and right-sided CHF acute decompensation  Pulmonary Hypertension - echo 09/2020 with EF 55-60%, LVH, IV septum flattened in systole and diastole c/w RV pressure and volume overload.  RVSF severely reduced.  Moderately elevated PASP.  - RHC 11/2020 with moderate pulm artery HTN, group 3 vs group 4 - cardiology c/s, appreciate recs - lasix 80 mg BID, metolazone x1 today, unna boots, fluid restriction, midodrine for BP support  - may need inotropic support with milrinone  - continue riociguat for CTEPH  - may need RHC   AKI on CKD stage IIIb -baseline creatinine appears to be around 1.3-1.6 - 2.13 at presentation, has improved close to his baseline - follow, w/u  additionally as needed   History of hypotension -Continue midodrine   History of PE -Continue Eliquis   BPH -Finasteride and Flomax -he self caths, follow closely   COPD -Stable, no symptoms signs of acute exacerbation  OSA - apparently unable to tolerate CPAP  DVT prophylaxis: eliquis Code Status: full  Family Communication:none at bedside Disposition:   Status is: Inpatient  Remains inpatient appropriate because:Inpatient level of care appropriate due to severity of illness  Dispo: The patient is from: Home              Anticipated d/c is to: Home              Patient currently is not medically stable to d/c.   Difficult to place patient No       Consultants:  cards  Procedures:    Antimicrobials: Anti-infectives (From admission, onward)    None          Subjective: No complaints, feels about the same  Objective: Vitals:   04/13/21 0855 04/13/21 0856 04/13/21 1158 04/13/21 1615  BP:   101/67 106/68  Pulse:   81 88  Resp:   20 20  Temp:   (!) 97.5 F (36.4 C) 97.8 F (36.6 C)  TempSrc:   Oral Oral  SpO2: 97% 97% 96% (!) 89%  Weight:      Height:        Intake/Output Summary (Last 24 hours) at 04/13/2021 1726 Last data filed at 04/13/2021 1615 Gross per 24 hour  Intake 480 ml  Output 2850 ml  Net -2370 ml   Filed Weights   04/12/21 2130 04/13/21 0500  Weight: 108 kg 107.8 kg    Examination:  General exam: Appears calm and comfortable  Respiratory system: Clear to auscultation. Respiratory effort normal. Cardiovascular system: S1 & S2 heard, RRR.  Gastrointestinal system: Abdomen is protuberant, soft and nontender. Central nervous system: Alert and oriented. No focal neurological deficits. Extremities: bilateral LE edema, unna boots Skin: No rashes, lesions or ulcers Psychiatry: Judgement and insight appear normal. Mood & affect appropriate.     Data Reviewed: I have personally reviewed following labs and imaging  studies  CBC: Recent Labs  Lab 04/12/21 1435  WBC 3.9*  NEUTROABS 2.8  HGB 15.0  HCT 47.1  MCV 89.4  PLT 127    Basic Metabolic Panel: Recent Labs  Lab 04/11/21 1047 04/12/21 1435 04/13/21 0154  NA 139 137 139  K 4.7 4.9 4.0  CL 101 100 100  CO2 32 30 32  GLUCOSE 91 107* 86  BUN 33* 33* 30*  CREATININE 2.23* 2.13* 1.66*  CALCIUM 8.9 9.1 9.1    GFR: Estimated Creatinine Clearance: 44.7 mL/min ([name]) (by C-G formula based on SCr of 1.66 mg/dL (H)).  Liver Function Tests: No results for input(s): AST, ALT, ALKPHOS, BILITOT, PROT, ALBUMIN in the last 168 hours.  CBG: No results for input(s): GLUCAP in the last 168 hours.   Recent Results (from the past 240 hour(s))  Resp Panel by RT-PCR (Flu Jax Abdelrahman&B, Covid) Nasopharyngeal Swab     Status: None   Collection Time: 04/12/21  3:45 PM   Specimen: Nasopharyngeal Swab; Nasopharyngeal(NP) swabs in vial transport medium  Result Value Ref Range Status   SARS Coronavirus 2 by RT PCR NEGATIVE NEGATIVE Final    Comment: (NOTE) SARS-CoV-2 target nucleic acids are NOT DETECTED.  The SARS-CoV-2 RNA is generally detectable in upper respiratory specimens during the acute phase of infection. The lowest concentration of SARS-CoV-2 viral copies this assay can detect is 138 copies/mL. [name] negative result does not preclude SARS-Cov-2 infection and should not be used as the sole basis for treatment or other patient management decisions. [name] negative result may occur with  improper specimen collection/handling, submission of specimen other than nasopharyngeal swab, presence of viral mutation(s) within the areas targeted by this assay, and inadequate number of viral copies(<138 copies/mL). [name] negative result must be combined with clinical observations, patient history, and epidemiological information. The expected result is Negative.  Fact Sheet for Patients:  EntrepreneurPulse.com.au  Fact Sheet for Healthcare Providers:   IncredibleEmployment.be  This test is no t yet approved or cleared by the [place] FDA and  has been authorized for detection and/or diagnosis of SARS-CoV-2 by FDA under an Emergency Use Authorization (EUA). This EUA will remain  in effect (meaning this test can be used) for the duration of the COVID-19 declaration under Section 564(b)(1) of the Act, 21 U.S.C.section 360bbb-3(b)(1), unless the authorization is terminated  or revoked sooner.       Influenza Kaydee Magel by PCR NEGATIVE NEGATIVE Final   Influenza B by PCR NEGATIVE NEGATIVE Final    Comment: (NOTE) The Xpert Xpress SARS-CoV-2/FLU/RSV plus assay is intended as an aid in the diagnosis of influenza from Nasopharyngeal swab specimens and should not be used as Tyrell Seifer sole basis for treatment. Nasal washings and aspirates are unacceptable for Xpert Xpress SARS-CoV-2/FLU/RSV testing.  Fact Sheet for Patients: EntrepreneurPulse.com.au  Fact Sheet for Healthcare Providers: IncredibleEmployment.be  This test is not yet approved or cleared by the [place]  States FDA and has been authorized for detection and/or diagnosis of SARS-CoV-2 by FDA under an Emergency Use Authorization (EUA). This EUA will remain in effect (meaning this test can be used) for the duration of the COVID-19 declaration under Section 564(b)(1) of the Act, 21 U.S.C. section 360bbb-3(b)(1), unless the authorization is terminated or revoked.  Performed at [facility], [place] [address] [place], [place] [address]          Radiology Studies: DG Chest 2 View  Result Date: 04/12/2021 CLINICAL DATA:  Shortness of breath EXAM: CHEST - 2 VIEW COMPARISON:  Chest radiograph 01/03/2021, chest CT 12/02/2020 FINDINGS: Unchanged, enlarged cardiac silhouette with prominent pulmonary arteries. There is no focal airspace consolidation. There are changes of emphysema. Prominent pulmonary vasculature. No large pleural  effusion or visible pneumothorax. No acute osseous abnormality. Thoracic spondylosis IMPRESSION: Cardiomegaly with pulmonary vascular congestion. Electronically Signed   By: Maurine Simmering M.D.   On: 04/12/2021 15:11        Scheduled Meds:  allopurinol  100 mg Oral q morning   apixaban  5 mg Oral BID   arformoterol  15 mcg Nebulization BID   And   umeclidinium bromide  1 puff Inhalation Daily   finasteride  5 mg Oral QHS   furosemide  80 mg Intravenous BID   gabapentin  100 mg Oral TID   magnesium oxide  400 mg Oral q morning   midodrine  2.5 mg Oral TID WC   pantoprazole  40 mg Oral q morning   Riociguat  1.5 mg Oral TID   sodium chloride flush  3 mL Intravenous Q12H   tamsulosin  0.4 mg Oral QPC supper   Continuous Infusions:  sodium chloride       LOS: 1 day    Time spent: over 30 min    Fayrene Helper, MD [facility]   To contact the attending provider between 7A-7P or the covering provider during after hours 7P-7A, please log into the web site [email or web address] and access using universal [place] password for that web site. If you do not have the password, please call the hospital operator.  04/13/2021, 5:26 PM

## 2021-04-13 NOTE — Progress Notes (Addendum)
Advanced Heart Failure Rounding Note  PCP-Cardiologist: Sherren Mocha, MD   Subjective:    Admitted on 8/25 for management of acute on chronic right-sided heart failure with diuretics.  He received one dose of IV Lasix (80 mg) with 1.5L of urine output.  Weight is down one pound.    He is lying relatively flat in bed in no acute distress on 2L of supplemental oxygen.  He says he has not voided today, he has not yet received his AM dose of Lasix    Objective:   Weight Range: 107.8 kg Body mass index is 29.7 kg/m.   Vital Signs:   Temp:  [97.6 F (36.4 C)-97.9 F (36.6 C)] 97.7 F (36.5 C) (08/26 0512) Pulse Rate:  [69-91] 69 (08/26 0512) Resp:  [14-22] 19 (08/26 0512) BP: (91-121)/(55-77) 91/55 (08/26 0512) SpO2:  [93 %-100 %] 98 % (08/26 0512) Weight:  [107.8 kg-108 kg] 107.8 kg (08/26 0500) Last BM Date: 04/12/21  Weight change: Filed Weights   04/12/21 2130 04/13/21 0500  Weight: 108 kg 107.8 kg    Intake/Output:   Intake/Output Summary (Last 24 hours) at 04/13/2021 0817 Last data filed at 04/13/2021 0300 Gross per 24 hour  Intake 120 ml  Output 1550 ml  Net -1430 ml      Physical Exam    General:  Elderly male in no acute distress.  HEENT: Normal Neck: Supple. JVP to jaw . Carotids 2+ bilat; no bruits. No lymphadenopathy or thyromegaly appreciated. Cor: PMI nondisplaced. Regular rate & rhythm. No rubs or gallops Lungs: Coarse overall and diminished in bases  Abdomen: Soft, nontender, nondistended. No hepatosplenomegaly. No bruits or masses. Good bowel sounds. Extremities: No cyanosis, clubbing, rash, 2+ edema to BLE.  Legs wrapped Neuro: Alert & orientedx3, cranial nerves grossly intact. moves all 4 extremities w/o difficulty. Affect pleasant   Telemetry   NSR in 80s.  Personally reviewed   EKG     8/25: NSR with PACs   Labs    CBC Recent Labs    04/12/21 1435  WBC 3.9*  NEUTROABS 2.8  HGB 15.0  HCT 47.1  MCV 89.4  PLT 537    Basic Metabolic Panel Recent Labs    04/12/21 1435 04/13/21 0154  NA 137 139  K 4.9 4.0  CL 100 100  CO2 30 32  GLUCOSE 107* 86  BUN 33* 30*  CREATININE 2.13* 1.66*  CALCIUM 9.1 9.1   Liver Function Tests No results for input(s): AST, ALT, ALKPHOS, BILITOT, PROT, ALBUMIN in the last 72 hours. No results for input(s): LIPASE, AMYLASE in the last 72 hours. Cardiac Enzymes No results for input(s): CKTOTAL, CKMB, CKMBINDEX, TROPONINI in the last 72 hours.  BNP: BNP (last 3 results) Recent Labs    03/21/21 1056 04/11/21 1047 04/12/21 1435  BNP 1,487.5* 1,737.2* 1,597.1*    ProBNP (last 3 results) No results for input(s): PROBNP in the last 8760 hours.   D-Dimer No results for input(s): DDIMER in the last 72 hours. Hemoglobin A1C No results for input(s): HGBA1C in the last 72 hours. Fasting Lipid Panel No results for input(s): CHOL, HDL, LDLCALC, TRIG, CHOLHDL, LDLDIRECT in the last 72 hours. Thyroid Function Tests No results for input(s): TSH, T4TOTAL, T3FREE, THYROIDAB in the last 72 hours.  Invalid input(s): FREET3  Other results:   Imaging    DG Chest 2 View  Result Date: 04/12/2021 CLINICAL DATA:  Shortness of breath EXAM: CHEST - 2 VIEW COMPARISON:  Chest radiograph 01/03/2021, chest  CT 12/02/2020 FINDINGS: Unchanged, enlarged cardiac silhouette with prominent pulmonary arteries. There is no focal airspace consolidation. There are changes of emphysema. Prominent pulmonary vasculature. No large pleural effusion or visible pneumothorax. No acute osseous abnormality. Thoracic spondylosis IMPRESSION: Cardiomegaly with pulmonary vascular congestion. Electronically Signed   By: Maurine Simmering M.D.   On: 04/12/2021 15:11     Medications:     Scheduled Medications:  allopurinol  100 mg Oral q morning   apixaban  5 mg Oral BID   arformoterol  15 mcg Nebulization BID   And   umeclidinium bromide  1 puff Inhalation Daily   finasteride  5 mg Oral QHS    furosemide  80 mg Intravenous BID   gabapentin  100 mg Oral TID   magnesium oxide  400 mg Oral q morning   midodrine  2.5 mg Oral TID WC   pantoprazole  40 mg Oral q morning   Riociguat  1.5 mg Oral TID   sodium chloride flush  3 mL Intravenous Q12H   tamsulosin  0.4 mg Oral QPC supper    Infusions:  sodium chloride      PRN Medications: sodium chloride, acetaminophen, ondansetron (ZOFRAN) IV, sodium chloride flush    Patient Profile   Joshua [name] is an 85 y/o male with a medical history significant for HFpEF, prior PE on Eliquis, pulmonary hypertension and right-sided heart failure who presented to [facility] for management of acute on chronic right-sided heart failure.   Assessment/Plan   1. Acute on Chronic HFpEF/RV failure: Echo (2/22) with EF 55-60%, IV septum flattened, severe RV enlargement, severely decreased RV function, PASP 57 mmHg.  RHC in 4/22 showed normal filling pressures. Now being admitted for a/c RV failure w/ markedly volume overload + poor response to home diuretics. NYHA Class II-III.  - IV Lasix 80 mg bid - Unna boots in place  - Fluid restriction of 1800 mL/day   - C/w midodrine for BP support  - Follow BMP  - If unable to diurese adequately, may need inotropic support w/ milrinone  - suspect he will need to be discharged home w/ once weekly metolazone to keep fluid off  - 1.5L of UOP after first dose of Lasix on 8/24.  Await administration of second dose this morning -- will follow urine output closely  2. AKI on Stage III CKD: Baseline SCr ~1.6. SCr elevated at 2.1 on admit, in setting of volume overload and RV failure - follow BMP w/ diuresis. Cr now back to baseline  - continue midodrine for BP support, keep SBP >110 for renal perfusion 3. Pulmonary HTN: PAH noted on 4/22 RHC with PVR 6.1 WU.  This appears to be multifactorial with OSA, emphysema, hypoxemia with ambulation, and a suspected chronic PE involving the right middle lobe (group 3 and group 4  PH). Given the suspected mixed etiology with only 1 area of chronic thromboembolism (right middle lobe) as well as age, do not think that pulmonary thromboendarterectomy would be indicated.  Rheumatologic serologic workup was negative.  PFTs showed severe obstruction and moderate restriction, suggesting significant COPD. - Continue riociguat for CTEPH, 1.5 mg bid  - Continue w/ supp O2  4. OSA: untreated. Unable to tolerate CPAP  5. Emphysema: Prior smoker.  Emphysema on CT and severe obstruction on PFTs 6. Orthostatic Hypotension  - c/w midodrine + leg compression  7. Chronic PE - on Eliquis - denies bleeding  8. BPH - on Flomax - issues w/ urinary retention, often self  caths 3-4x/day  - if he has difficulty voiding, may need placement of indwelling foley catheter     Length of Stay: [place], NP  04/13/2021, 8:17 AM  Advanced Heart Failure Team Pager [phone] (M-F; 7a - 5p)  Please contact [facility] for night-coverage after hours (5p -7a ) and weekends on [email or web address]   Patient seen with NP, agree with the above note.   Patient denies dyspnea at rest, has increased LE swelling.  Creatinine down to 1.6 today, baseline.   General: NAD Neck: JVP 12-14 cm, no thyromegaly or thyroid nodule.  Lungs: Clear to auscultation bilaterally with normal respiratory effort. CV: Nondisplaced PMI.  Heart regular S1/S2, no S3/S4, no murmur. 2+ edema to knees.   Abdomen: Soft, nontender, no hepatosplenomegaly, no distention.  Skin: Intact without lesions or rashes.  Neurologic: Alert and oriented x 3.  Psych: Normal affect. Extremities: No clubbing or cyanosis.  HEENT: Normal.   Patient with right-sided HF and mixed group 3/4 pulmonary hypertension.  He remains significantly volume overloaded.  - Lasix 80 mg IV bid today with 1 dose of metolazone 2.5 x 1.   - Follow diuresis today, can consider milrinone for RV support if creatinine rises significantly or he does not diurese well.   - Continue home Adempas.  - May need RHC, will see how he does over the weekend.   Loralie Champagne 04/13/2021 1:49 PM

## 2021-04-13 NOTE — TOC Initial Note (Signed)
Transition of Care [facility]) - Initial/Assessment Note    Patient Details  Name: Joshua [name] MRN: [id number] Date of Birth: Dec 17, 1935  Transition of Care [facility]) CM/SW Contact:    Erenest Rasher, RN Phone Number:[phone] 04/13/2021, 4:31 PM  Clinical Narrative:                  HF TOC CM spoke to pt and states he is active with [facility]. Contacted [facility] [place] rep, Kenzie and pt is active for [facility]. Will need resumption of care orders for [facility]. Pt states he has RW and oxygen at home. His wife, Joshua [name] assist him at home. Gave permission to speak to wife.      Expected Discharge Plan: [place] Barriers to Discharge: Continued Medical Work up   Patient Goals and CMS Choice   CMS Medicare.gov Compare Post Acute Care list provided to:: Patient Choice offered to / list presented to : Patient  Expected Discharge Plan and Services Expected Discharge Plan: [place] In-house Referral: Clinical Social Work Discharge Planning Services: CM Consult Post Acute Care Choice: [place] arrangements for the past 2 months: [place]: RN [place] Agency: [place] ([place]) Date HH Agency Contacted: 04/13/21 Time HH Agency Contacted: 78 Representative spoke with at [place]: Wylene Men  Prior Living Arrangements/Services Living arrangements for the past 2 months: [place] with:: Spouse Patient language and need for interpreter reviewed:: Yes Do you feel safe going back to the place where you live?: Yes      Need for Family Participation in Patient Care: Yes (Comment) Care giver support system in place?: Yes (comment) Current home services: DME (rolling walker, cane, oxygen ([employer])) Criminal Activity/Legal Involvement Pertinent to Current Situation/Hospitalization: No - Comment as needed  Activities of Daily Living Home Assistive Devices/Equipment:  Walker (specify type), Oxygen ADL Screening (condition at time of admission) Patient's cognitive ability adequate to safely complete daily activities?: Yes Is the patient deaf or have difficulty hearing?: No Does the patient have difficulty seeing, even when wearing glasses/contacts?: No Does the patient have difficulty concentrating, remembering, or making decisions?: No Patient able to express need for assistance with ADLs?: No Does the patient have difficulty dressing or bathing?: No Independently performs ADLs?: Yes (appropriate for developmental age) Does the patient have difficulty walking or climbing stairs?: Yes Weakness of Legs: None Weakness of Arms/Hands: None  Permission Sought/Granted Permission sought to share information with : Case Manager, PCP, Family Supports Permission granted to share information with : Yes, Verbal Permission Granted  Share Information with NAME: Joshua [name]  Permission granted to share info w AGENCY: [place] granted to share info w Relationship: wife  Permission granted to share info w Contact Information: [phone]  Emotional Assessment   Attitude/Demeanor/Rapport: Gracious, Engaged Affect (typically observed): Accepting Orientation: : Oriented to Self, Oriented to Place, Oriented to  Time, Oriented to Situation   Psych Involvement: No (comment)  Admission diagnosis:  CHF (congestive heart failure) (HCC) [I50.9] Acute on chronic congestive heart failure, unspecified heart failure type [facility]) [I50.9] Patient Active Problem List   Diagnosis Date Noted   Acute on chronic diastolic (congestive) heart failure ([place]) 04/12/2021   Acute on chronic right-sided heart failure ([place]) 04/12/2021   Acute kidney injury superimposed  on CKD ([place]) 04/12/2021   OSA (obstructive sleep apnea) 12/07/2020   Aortic atherosclerosis ([place]) 12/07/2020   CHF (congestive heart failure) ([place]) 12/03/2020   Syncope and collapse 12/03/2020   Chronic  right-sided heart failure ([place]) 12/03/2020   Pulmonary HTN ([place]) 09/29/2017   BPH with urinary obstruction 08/25/2017   Syncope, vasovagal 08/25/2017   Demand ischemia of myocardium ([place]) 93/81/8299   Diastolic CHF ([place]) 37/16/9678   Essential hypertension 02/16/2015   COPD (chronic obstructive pulmonary disease) ([place]) 12/12/2014   History of pulmonary embolism 10/10/2014   Chronic respiratory failure with hypoxia ([place]) 10/10/2014   S/P knee replacement 09/13/2014   PCP:  [name], DO Pharmacy:   [id number] [id number]: [phone]: [phone] [place]Phone: [phone]: [phone]    Social Determinants of Health (SDOH) Interventions    Readmission Risk Interventions No flowsheet data found.

## 2021-04-13 NOTE — Plan of Care (Signed)
  Problem: Clinical Measurements: Goal: Ability to maintain clinical measurements within normal limits will improve Outcome: Progressing Goal: Will remain free from infection Outcome: Progressing Goal: Diagnostic test results will improve Outcome: Progressing Goal: Respiratory complications will improve Outcome: Progressing Goal: Cardiovascular complication will be avoided Outcome: Progressing

## 2021-04-14 DIAGNOSIS — I50813 Acute on chronic right heart failure: Secondary | ICD-10-CM | POA: Diagnosis not present

## 2021-04-14 LAB — CBC WITH DIFFERENTIAL/PLATELET
Abs Immature Granulocytes: 0.01 10*3/uL (ref 0.00–0.07)
Basophils Absolute: 0 10*3/uL (ref 0.0–0.1)
Basophils Relative: 1 %
Eosinophils Absolute: 0.3 10*3/uL (ref 0.0–0.5)
Eosinophils Relative: 8 %
HCT: 44.6 % (ref 39.0–52.0)
Hemoglobin: 14.6 g/dL (ref 13.0–17.0)
Immature Granulocytes: 0 %
Lymphocytes Relative: 19 %
Lymphs Abs: 0.7 10*3/uL (ref 0.7–4.0)
MCH: 28.3 pg (ref 26.0–34.0)
MCHC: 32.7 g/dL (ref 30.0–36.0)
MCV: 86.6 fL (ref 80.0–100.0)
Monocytes Absolute: 0.5 10*3/uL (ref 0.1–1.0)
Monocytes Relative: 15 %
Neutro Abs: 2 10*3/uL (ref 1.7–7.7)
Neutrophils Relative %: 57 %
Platelets: 150 10*3/uL (ref 150–400)
RBC: 5.15 MIL/uL (ref 4.22–5.81)
RDW: 16 % — ABNORMAL HIGH (ref 11.5–15.5)
WBC: 3.5 10*3/uL — ABNORMAL LOW (ref 4.0–10.5)
nRBC: 0 % (ref 0.0–0.2)

## 2021-04-14 LAB — COMPREHENSIVE METABOLIC PANEL
ALT: 12 U/L (ref 0–44)
AST: 20 U/L (ref 15–41)
Albumin: 2.6 g/dL — ABNORMAL LOW (ref 3.5–5.0)
Alkaline Phosphatase: 92 U/L (ref 38–126)
Anion gap: 9 (ref 5–15)
BUN: 26 mg/dL — ABNORMAL HIGH (ref 8–23)
CO2: 30 mmol/L (ref 22–32)
Calcium: 9 mg/dL (ref 8.9–10.3)
Chloride: 99 mmol/L (ref 98–111)
Creatinine, Ser: 1.48 mg/dL — ABNORMAL HIGH (ref 0.61–1.24)
GFR, Estimated: 47 mL/min — ABNORMAL LOW (ref >60)
Glucose, Bld: 80 mg/dL (ref 70–99)
Potassium: 3.8 mmol/L (ref 3.5–5.1)
Sodium: 138 mmol/L (ref 135–145)
Total Bilirubin: 1.2 mg/dL (ref 0.3–1.2)
Total Protein: 5.6 g/dL — ABNORMAL LOW (ref 6.5–8.1)

## 2021-04-14 LAB — PHOSPHORUS: Phosphorus: 3.5 mg/dL (ref 2.5–4.6)

## 2021-04-14 LAB — MAGNESIUM: Magnesium: 1.5 mg/dL — ABNORMAL LOW (ref 1.7–2.4)

## 2021-04-14 MED ORDER — MAGNESIUM SULFATE 4 GM/100ML IV SOLN
4.0000 g | Freq: Once | INTRAVENOUS | Status: AC
  Administered 2021-04-14: 4 g via INTRAVENOUS
  Filled 2021-04-14: qty 100

## 2021-04-14 MED ORDER — POTASSIUM CHLORIDE CRYS ER 20 MEQ PO TBCR
40.0000 meq | EXTENDED_RELEASE_TABLET | Freq: Once | ORAL | Status: AC
  Administered 2021-04-14: 40 meq via ORAL
  Filled 2021-04-14: qty 2

## 2021-04-14 MED ORDER — METOLAZONE 5 MG PO TABS
2.5000 mg | ORAL_TABLET | Freq: Once | ORAL | Status: AC
  Administered 2021-04-14: 2.5 mg via ORAL
  Filled 2021-04-14: qty 1

## 2021-04-14 NOTE — Plan of Care (Signed)
  Problem: Clinical Measurements: ?Goal: Ability to maintain clinical measurements within normal limits will improve ?Outcome: Progressing ?Goal: Will remain free from infection ?Outcome: Progressing ?Goal: Diagnostic test results will improve ?Outcome: Progressing ?Goal: Respiratory complications will improve ?Outcome: Progressing ?Goal: Cardiovascular complication will be avoided ?Outcome: Progressing ?  ?

## 2021-04-14 NOTE — Progress Notes (Signed)
PROGRESS NOTE    [name]  EVO:[id number] DOB: 12-Feb-1936 DOA: 04/12/2021 PCP: [name], DO   Chief Complaint  Patient presents with   Leg Swelling   Brief Narrative:  Joshua [name] is Joshua [name] 85 y.o. male with medical history significant of chronic diastolic CHF, chronic right-sided CHF, hypotension on midodrine, COPD, BPH, CKD stage II, PE on Eliquis, presented with worsening of bilateral leg swelling.   Patient has had refractory right-sided CHF, has been following with cardiologist, despite being on Lasix, patient gained> 20 pounds in last 4 to 5 weeks.  Yesterday, patient went to see cardiologist, and received 1 dose of Lasix injection, and sent to ED for further management. patient denied any chest pain, no shortness of breath no cough no fever chills.  Denies any urinary symptoms or diarrhea. ED Course: Blood pressure borderline low, blood work showed worsening of kidney function creatinine 2.1, K4.9.  Chest x-ray negative for acute infiltrates.   Assessment & Plan:   Principal Problem:   Acute on chronic right-sided heart failure (HCC) Active Problems:   Pulmonary HTN (HCC)   Acute on chronic diastolic (congestive) heart failure (HCC)   Acute kidney injury superimposed on CKD (HCC)  Acute on chronic diastolic and right-sided CHF acute decompensation  Pulmonary Hypertension - echo 09/2020 with EF 55-60%, LVH, IV septum flattened in systole and diastole c/w RV pressure and volume overload.  RVSF severely reduced.  Moderately elevated PASP.  - RHC 11/2020 with moderate pulm artery HTN, group 3 vs group 4 - cardiology c/s, appreciate recs - lasix 80 mg BID, metolazone per cardiology, unna boots, fluid restriction, midodrine for BP support - per cards - Strict I/O, daily weights    AKI on CKD stage IIIb -baseline creatinine appears to be around 1.3-1.6 - 2.13 at presentation, has improved close to his baseline - trend - follow, w/u additionally as needed   History of  hypotension -Continue midodrine   History of PE -Continue Eliquis   BPH -Finasteride and Flomax -he self caths, follow closely   COPD -Stable, no symptoms signs of acute exacerbation  OSA - apparently unable to tolerate CPAP  DVT prophylaxis: eliquis Code Status: full  Family Communication:none at bedside Disposition:   Status is: Inpatient  Remains inpatient appropriate because:Inpatient level of care appropriate due to severity of illness  Dispo: The patient is from: Home              Anticipated d/c is to: Home              Patient currently is not medically stable to d/c.   Difficult to place patient No       Consultants:  cards  Procedures:    Antimicrobials: Anti-infectives (From admission, onward)    None          Subjective: No complaints Asking how long he's going to need to stay  Objective: Vitals:   04/14/21 0500 04/14/21 0753 04/14/21 0935 04/14/21 1117  BP: 97/61  (!) 89/56 99/66  Pulse: 78  80 85  Resp: 19   18  Temp: 97.8 F (36.6 C)   (!) 97.4 F (36.3 C)  TempSrc: Oral   Oral  SpO2: 90% 100% 100% 91%  Weight: 107.1 kg     Height:        Intake/Output Summary (Last 24 hours) at 04/14/2021 1454 Last data filed at 04/14/2021 1200 Gross per 24 hour  Intake 1080 ml  Output 4650 ml  Net -3570  ml   Filed Weights   04/12/21 2130 04/13/21 0500 04/14/21 0500  Weight: 108 kg 107.8 kg 107.1 kg    Examination:  General: No acute distress. Cardiovascular: Heart sounds show Aira Sallade regular rate, and rhythm. Lungs: Clear to auscultation bilaterally  Abdomen: Soft, nontender, nondistended Neurological: Alert and oriented 3. Moves all extremities 4. Cranial nerves II through XII grossly intact. Skin: Warm and dry. No rashes or lesions. Extremities: bilateral LE edema, unna boots    Data Reviewed: I have personally reviewed following labs and imaging studies  CBC: Recent Labs  Lab 04/12/21 1435 04/14/21 0325  WBC 3.9* 3.5*   NEUTROABS 2.8 2.0  HGB 15.0 14.6  HCT 47.1 44.6  MCV 89.4 86.6  PLT 161 240    Basic Metabolic Panel: Recent Labs  Lab 04/11/21 1047 04/12/21 1435 04/13/21 0154 04/14/21 0325  NA 139 137 139 138  K 4.7 4.9 4.0 3.8  CL 101 100 100 99  CO2 32 30 32 30  GLUCOSE 91 107* 86 80  BUN 33* 33* 30* 26*  CREATININE 2.23* 2.13* 1.66* 1.48*  CALCIUM 8.9 9.1 9.1 9.0  MG  --   --   --  1.5*  PHOS  --   --   --  3.5    GFR: Estimated Creatinine Clearance: 50 mL/min (Jaisa Defino) (by C-G formula based on SCr of 1.48 mg/dL (H)).  Liver Function Tests: Recent Labs  Lab 04/14/21 0325  AST 20  ALT 12  ALKPHOS 92  BILITOT 1.2  PROT 5.6*  ALBUMIN 2.6*    CBG: No results for input(s): GLUCAP in the last 168 hours.   Recent Results (from the past 240 hour(s))  Resp Panel by RT-PCR (Flu Lodie Waheed&B, Covid) Nasopharyngeal Swab     Status: None   Collection Time: 04/12/21  3:45 PM   Specimen: Nasopharyngeal Swab; Nasopharyngeal(NP) swabs in vial transport medium  Result Value Ref Range Status   SARS Coronavirus 2 by RT PCR NEGATIVE NEGATIVE Final    Comment: (NOTE) SARS-CoV-2 target nucleic acids are NOT DETECTED.  The SARS-CoV-2 RNA is generally detectable in upper respiratory specimens during the acute phase of infection. The lowest concentration of SARS-CoV-2 viral copies this assay can detect is 138 copies/mL. Atilano Covelli negative result does not preclude SARS-Cov-2 infection and should not be used as the sole basis for treatment or other patient management decisions. Tylena Prisk negative result may occur with  improper specimen collection/handling, submission of specimen other than nasopharyngeal swab, presence of viral mutation(s) within the areas targeted by this assay, and inadequate number of viral copies(<138 copies/mL). Clovis Mankins negative result must be combined with clinical observations, patient history, and epidemiological information. The expected result is Negative.  Fact Sheet for Patients:   EntrepreneurPulse.com.au  Fact Sheet for Healthcare Providers:  IncredibleEmployment.be  This test is no t yet approved or cleared by the [place] FDA and  has been authorized for detection and/or diagnosis of SARS-CoV-2 by FDA under an Emergency Use Authorization (EUA). This EUA will remain  in effect (meaning this test can be used) for the duration of the COVID-19 declaration under Section 564(b)(1) of the Act, 21 U.S.C.section 360bbb-3(b)(1), unless the authorization is terminated  or revoked sooner.       Influenza Stormie Ventola by PCR NEGATIVE NEGATIVE Final   Influenza B by PCR NEGATIVE NEGATIVE Final    Comment: (NOTE) The Xpert Xpress SARS-CoV-2/FLU/RSV plus assay is intended as an aid in the diagnosis of influenza from Nasopharyngeal swab specimens and should not  be used as Teron Blais sole basis for treatment. Nasal washings and aspirates are unacceptable for Xpert Xpress SARS-CoV-2/FLU/RSV testing.  Fact Sheet for Patients: EntrepreneurPulse.com.au  Fact Sheet for Healthcare Providers: IncredibleEmployment.be  This test is not yet approved or cleared by the [place] FDA and has been authorized for detection and/or diagnosis of SARS-CoV-2 by FDA under an Emergency Use Authorization (EUA). This EUA will remain in effect (meaning this test can be used) for the duration of the COVID-19 declaration under Section 564(b)(1) of the Act, 21 U.S.C. section 360bbb-3(b)(1), unless the authorization is terminated or revoked.  Performed at [facility], [place] [address] [place], [place] [address]          Radiology Studies: No results found.      Scheduled Meds:  allopurinol  100 mg Oral q morning   apixaban  5 mg Oral BID   arformoterol  15 mcg Nebulization BID   And   umeclidinium bromide  1 puff Inhalation Daily   finasteride  5 mg Oral QHS   furosemide  80 mg Intravenous BID   gabapentin   100 mg Oral TID   magnesium oxide  400 mg Oral q morning   metolazone  2.5 mg Oral Once   midodrine  2.5 mg Oral TID WC   pantoprazole  40 mg Oral q morning   potassium chloride  40 mEq Oral Once   Riociguat  1.5 mg Oral TID   sodium chloride flush  3 mL Intravenous Q12H   tamsulosin  0.4 mg Oral QPC supper   Continuous Infusions:  sodium chloride     magnesium sulfate bolus IVPB       LOS: 2 days    Time spent: over 30 min    Fayrene Helper, MD [facility]   To contact the attending provider between 7A-7P or the covering provider during after hours 7P-7A, please log into the web site [email or web address] and access using universal [place] password for that web site. If you do not have the password, please call the hospital operator.  04/14/2021, 2:54 PM

## 2021-04-14 NOTE — Progress Notes (Addendum)
Advanced Heart Failure Rounding Note  PCP-Cardiologist: Sherren Mocha, MD   Subjective:    Admitted on 8/25 for management of acute on chronic right-sided heart failure with diuretics.   Excellent urine output on IV Lasix 80 bid and metolazone ->  4.2 liters off. SCr 1.66 ->1.48  Weight down 1.5 pounds.   Feels better. Denies CP, orthopnea or PND. Urine still very clear   Objective:   Weight Range: 107.1 kg Body mass index is 29.51 kg/m.   Vital Signs:   Temp:  [97.5 F (36.4 C)-97.8 F (36.6 C)] 97.8 F (36.6 C) (08/27 0500) Pulse Rate:  [77-88] 80 (08/27 0935) Resp:  [19-20] 19 (08/27 0500) BP: (89-113)/(56-70) 89/56 (08/27 0935) SpO2:  [89 %-100 %] 100 % (08/27 0935) Weight:  [107.1 kg] 107.1 kg (08/27 0500) Last BM Date: 04/13/21  Weight change: Filed Weights   04/12/21 2130 04/13/21 0500 04/14/21 0500  Weight: 108 kg 107.8 kg 107.1 kg    Intake/Output:   Intake/Output Summary (Last 24 hours) at 04/14/2021 1045 Last data filed at 04/14/2021 0700 Gross per 24 hour  Intake 600 ml  Output 3950 ml  Net -3350 ml       Physical Exam    General:  Well appearing. No resp difficulty HEENT: normal Neck: supple. JVP 8 Carotids 2+ bilat; no bruits. No lymphadenopathy or thryomegaly appreciated. Cor: PMI nondisplaced. Regular rate & rhythm. No rubs, gallops or murmurs. Lungs: clear Abdomen: obese soft, nontender, nondistended. No hepatosplenomegaly. No bruits or masses. Good bowel sounds. Extremities: no cyanosis, clubbing, rash, 1+ edema Neuro: alert & orientedx3, cranial nerves grossly intact. moves all 4 extremities w/o difficulty. Affect pleasant  Telemetry   NSR in 80s.  Personally reviewed   Labs    CBC Recent Labs    04/12/21 1435 04/14/21 0325  WBC 3.9* 3.5*  NEUTROABS 2.8 2.0  HGB 15.0 14.6  HCT 47.1 44.6  MCV 89.4 86.6  PLT 161 433    Basic Metabolic Panel Recent Labs    04/13/21 0154 04/14/21 0325  NA 139 138  K 4.0 3.8  CL  100 99  CO2 32 30  GLUCOSE 86 80  BUN 30* 26*  CREATININE 1.66* 1.48*  CALCIUM 9.1 9.0  MG  --  1.5*  PHOS  --  3.5    Liver Function Tests Recent Labs    04/14/21 0325  AST 20  ALT 12  ALKPHOS 92  BILITOT 1.2  PROT 5.6*  ALBUMIN 2.6*   No results for input(s): LIPASE, AMYLASE in the last 72 hours. Cardiac Enzymes No results for input(s): CKTOTAL, CKMB, CKMBINDEX, TROPONINI in the last 72 hours.  BNP: BNP (last 3 results) Recent Labs    03/21/21 1056 04/11/21 1047 04/12/21 1435  BNP 1,487.5* 1,737.2* 1,597.1*     ProBNP (last 3 results) No results for input(s): PROBNP in the last 8760 hours.   D-Dimer No results for input(s): DDIMER in the last 72 hours. Hemoglobin A1C No results for input(s): HGBA1C in the last 72 hours. Fasting Lipid Panel No results for input(s): CHOL, HDL, LDLCALC, TRIG, CHOLHDL, LDLDIRECT in the last 72 hours. Thyroid Function Tests No results for input(s): TSH, T4TOTAL, T3FREE, THYROIDAB in the last 72 hours.  Invalid input(s): FREET3  Other results:   Imaging    No results found.   Medications:     Scheduled Medications:  allopurinol  100 mg Oral q morning   apixaban  5 mg Oral BID   arformoterol  15 mcg Nebulization BID   And   umeclidinium bromide  1 puff Inhalation Daily   finasteride  5 mg Oral QHS   furosemide  80 mg Intravenous BID   gabapentin  100 mg Oral TID   magnesium oxide  400 mg Oral q morning   midodrine  2.5 mg Oral TID WC   pantoprazole  40 mg Oral q morning   Riociguat  1.5 mg Oral TID   sodium chloride flush  3 mL Intravenous Q12H   tamsulosin  0.4 mg Oral QPC supper    Infusions:  sodium chloride      PRN Medications: sodium chloride, acetaminophen, ondansetron (ZOFRAN) IV, sodium chloride flush    Patient Profile   Joshua [name] is an 85 y/o male with a medical history significant for HFpEF, prior PE on Eliquis, pulmonary hypertension and right-sided heart failure who presented  to [facility] for management of acute on chronic right-sided heart failure.   Assessment/Plan   1. Acute on Chronic HFpEF/RV failure: Echo (2/22) with EF 55-60%, IV septum flattened, severe RV enlargement, severely decreased RV function, PASP 57 mmHg.  RHC in 4/22 showed normal filling pressures. Now being admitted for a/c RV failure w/ markedly volume overload + poor response to home diuretics. NYHA Class II-III.  - Diuresing well with IV lasix. Will continue. Repeat metolazone today.   - Unna boots in place  - Fluid restriction of 1800 mL/day   - C/w midodrine for BP support  - Follow BMP  - suspect he will need to be discharged home w/ once weekly metolazone to keep fluid off  2. AKI on Stage III CKD: Baseline SCr ~1.6. SCr elevated at 2.1 on admit, in setting of volume overload and RV failure. Scr down to 1.48 - follow BMP w/ diuresis. - continue midodrine for BP support, keep SBP >110 for renal perfusion 3. Pulmonary HTN: PAH noted on 4/22 RHC with PVR 6.1 WU.  This appears to be multifactorial with OSA, emphysema, hypoxemia with ambulation, and a suspected chronic PE involving the right middle lobe (group 3 and group 4 PH). Given the suspected mixed etiology with only 1 area of chronic thromboembolism (right middle lobe) as well as age, do not think that pulmonary thromboendarterectomy would be indicated.  Rheumatologic serologic workup was negative.  PFTs showed severe obstruction and moderate restriction, suggesting significant COPD. - Continue riociguat for CTEPH, 1.5 mg bid  - Continue w/ supp O2  4. OSA: untreated. Unable to tolerate CPAP  5. Emphysema: Prior smoker.  Emphysema on CT and severe obstruction on PFTs 6. Orthostatic Hypotension  - c/w midodrine + leg compression  7. Chronic PE - on Eliquis - denies bleeding  8. BPH - on Flomax - issues w/ urinary retention, often self caths 3-4x/day  - if he has difficulty voiding, may need placement of indwelling foley catheter. He is  voiding well today  9. Hypokalemia/hypomag - will supp  Length of Stay: 2  Glori Bickers, MD  04/14/2021, 10:45 AM  Advanced Heart Failure Team Pager [phone] (M-F; 7a - 5p)  Please contact [facility] for night-coverage after hours (5p -7a ) and weekends on [email or web address]

## 2021-04-15 DIAGNOSIS — I50813 Acute on chronic right heart failure: Secondary | ICD-10-CM | POA: Diagnosis not present

## 2021-04-15 LAB — BASIC METABOLIC PANEL
Anion gap: 7 (ref 5–15)
BUN: 21 mg/dL (ref 8–23)
CO2: 36 mmol/L — ABNORMAL HIGH (ref 22–32)
Calcium: 9.1 mg/dL (ref 8.9–10.3)
Chloride: 94 mmol/L — ABNORMAL LOW (ref 98–111)
Creatinine, Ser: 1.41 mg/dL — ABNORMAL HIGH (ref 0.61–1.24)
GFR, Estimated: 49 mL/min — ABNORMAL LOW (ref >60)
Glucose, Bld: 90 mg/dL (ref 70–99)
Potassium: 3.3 mmol/L — ABNORMAL LOW (ref 3.5–5.1)
Sodium: 137 mmol/L (ref 135–145)

## 2021-04-15 LAB — MAGNESIUM: Magnesium: 2.1 mg/dL (ref 1.7–2.4)

## 2021-04-15 MED ORDER — POTASSIUM CHLORIDE CRYS ER 20 MEQ PO TBCR
40.0000 meq | EXTENDED_RELEASE_TABLET | ORAL | Status: AC
  Administered 2021-04-15 (×2): 40 meq via ORAL
  Filled 2021-04-15 (×2): qty 2

## 2021-04-15 MED ORDER — POTASSIUM CHLORIDE CRYS ER 20 MEQ PO TBCR
40.0000 meq | EXTENDED_RELEASE_TABLET | Freq: Once | ORAL | Status: AC
  Administered 2021-04-15: 40 meq via ORAL
  Filled 2021-04-15: qty 2

## 2021-04-15 MED ORDER — CHLORHEXIDINE GLUCONATE CLOTH 2 % EX PADS
6.0000 | MEDICATED_PAD | Freq: Every day | CUTANEOUS | Status: DC
  Administered 2021-04-15 – 2021-04-16 (×2): 6 via TOPICAL

## 2021-04-15 NOTE — Progress Notes (Addendum)
Advanced Heart Failure Rounding Note  PCP-Cardiologist: Sherren Mocha, MD   Subjective:    Admitted on 8/25 for management of acute on chronic right-sided heart failure with diuretics.   Excellent urine output on IV Lasix 80 bid and metolazone. Weight down 11 pounds.   Developed urinary retention this am and had in/out cath with 1,600cc/out. SCr stable at 1.4 K 3.3  Feels breathing is better. Denies SOB, orthopnea or PND.    Objective:   Weight Range: 103.1 kg Body mass index is 28.42 kg/m.   Vital Signs:   Temp:  [97.4 F (36.3 C)-97.9 F (36.6 C)] 97.4 F (36.3 C) (08/28 1000) Pulse Rate:  [83-89] 86 (08/28 1000) Resp:  [17-19] 17 (08/28 1000) BP: (96-107)/(64-75) 96/64 (08/28 1000) SpO2:  [92 %-96 %] 96 % (08/28 1000) Weight:  [103.1 kg] 103.1 kg (08/28 0416) Last BM Date: 04/15/21  Weight change: Filed Weights   04/13/21 0500 04/14/21 0500 04/15/21 0416  Weight: 107.8 kg 107.1 kg 103.1 kg    Intake/Output:   Intake/Output Summary (Last 24 hours) at 04/15/2021 1212 Last data filed at 04/15/2021 1100 Gross per 24 hour  Intake 1320 ml  Output 4200 ml  Net -2880 ml       Physical Exam    General:  Sitting up in bed. No resp difficulty HEENT: normal Neck: supple. JVP to ear  Carotids 2+ bilat; no bruits. No lymphadenopathy or thryomegaly appreciated. Cor: PMI nondisplaced. Regular rate & rhythm. No rubs, gallops or murmurs. Lungs: clear Abdomen: soft, nontender, + distended. No hepatosplenomegaly. No bruits or masses. Good bowel sounds. Extremities: no cyanosis, clubbing, rash, 2+ edema + UNNA Neuro: alert & orientedx3, cranial nerves grossly intact. moves all 4 extremities w/o difficulty. Affect pleasant  Telemetry   NSR 80-90s.  Personally reviewed   Labs    CBC Recent Labs    04/12/21 1435 04/14/21 0325  WBC 3.9* 3.5*  NEUTROABS 2.8 2.0  HGB 15.0 14.6  HCT 47.1 44.6  MCV 89.4 86.6  PLT 161 383    Basic Metabolic Panel Recent  Labs    04/14/21 0325 04/15/21 0520  NA 138 137  K 3.8 3.3*  CL 99 94*  CO2 30 36*  GLUCOSE 80 90  BUN 26* 21  CREATININE 1.48* 1.41*  CALCIUM 9.0 9.1  MG 1.5* 2.1  PHOS 3.5  --     Liver Function Tests Recent Labs    04/14/21 0325  AST 20  ALT 12  ALKPHOS 92  BILITOT 1.2  PROT 5.6*  ALBUMIN 2.6*    No results for input(s): LIPASE, AMYLASE in the last 72 hours. Cardiac Enzymes No results for input(s): CKTOTAL, CKMB, CKMBINDEX, TROPONINI in the last 72 hours.  BNP: BNP (last 3 results) Recent Labs    03/21/21 1056 04/11/21 1047 04/12/21 1435  BNP 1,487.5* 1,737.2* 1,597.1*     ProBNP (last 3 results) No results for input(s): PROBNP in the last 8760 hours.   D-Dimer No results for input(s): DDIMER in the last 72 hours. Hemoglobin A1C No results for input(s): HGBA1C in the last 72 hours. Fasting Lipid Panel No results for input(s): CHOL, HDL, LDLCALC, TRIG, CHOLHDL, LDLDIRECT in the last 72 hours. Thyroid Function Tests No results for input(s): TSH, T4TOTAL, T3FREE, THYROIDAB in the last 72 hours.  Invalid input(s): FREET3  Other results:   Imaging    No results found.   Medications:     Scheduled Medications:  allopurinol  100 mg Oral q morning  apixaban  5 mg Oral BID   arformoterol  15 mcg Nebulization BID   And   umeclidinium bromide  1 puff Inhalation Daily   finasteride  5 mg Oral QHS   furosemide  80 mg Intravenous BID   gabapentin  100 mg Oral TID   magnesium oxide  400 mg Oral q morning   midodrine  2.5 mg Oral TID WC   pantoprazole  40 mg Oral q morning   potassium chloride  40 mEq Oral Q4H   Riociguat  1.5 mg Oral TID   sodium chloride flush  3 mL Intravenous Q12H   tamsulosin  0.4 mg Oral QPC supper    Infusions:  sodium chloride      PRN Medications: sodium chloride, acetaminophen, ondansetron (ZOFRAN) IV, sodium chloride flush    Patient Profile   Joshua [name] is an 85 y/o male with a medical history  significant for HFpEF, prior PE on Eliquis, pulmonary hypertension and right-sided heart failure who presented to [facility] for management of acute on chronic right-sided heart failure.   Assessment/Plan   1. Acute on Chronic HFpEF/RV failure: Echo (2/22) with EF 55-60%, IV septum flattened, severe RV enlargement, severely decreased RV function, PASP 57 mmHg.  RHC in 4/22 showed normal filling pressures. Now being admitted for a/c RV failure w/ markedly volume overload + poor response to home diuretics. NYHA Class II-III.  - Diuresing well with IV lasix and metolazone. Weight down 11 pounds  Remains volume overloaded. Continue diuresis. Place Foley duet o urinary retention - Unna boots in place - C/w midodrine for BP support  - Follow BMP  - suspect he will need to be discharged home w/ once weekly metolazone to keep fluid off  2. AKI on Stage III CKD: Baseline SCr ~1.6. SCr elevated at 2.1 on admit, in setting of volume overload and RV failure. Scr back to baseline at 1.4 - follow BMP w/ diuresis. - continue midodrine for BP support, keep SBP >110 for renal perfusion 3. Pulmonary HTN: PAH noted on 4/22 RHC with PVR 6.1 WU.  This appears to be multifactorial with OSA, emphysema, hypoxemia with ambulation, and a suspected chronic PE involving the right middle lobe (group 3 and group 4 PH). Given the suspected mixed etiology with only 1 area of chronic thromboembolism (right middle lobe) as well as age, do not think that pulmonary thromboendarterectomy would be indicated.  Rheumatologic serologic workup was negative.  PFTs showed severe obstruction and moderate restriction, suggesting significant COPD. - Continue riociguat for CTEPH, 1.5 mg bid  - Continue w/ supp O2  4. OSA: untreated. Unable to tolerate CPAP  5. Emphysema: Prior smoker.  Emphysema on CT and severe obstruction on PFTs 6. Orthostatic Hypotension  - c/w midodrine + leg compression  7. Chronic PE - on Eliquis - denies bleeding  8. BPH  with urinary retention - on Flomax - issues w/ urinary retention, often self caths 3-4x/day  - had recurrent retention today. Relieved with straight cath 9. Hypokalemia/hypomag - K 3.3, MG 2.1. will supp K   Length of Stay: 3  Glori Bickers, MD  04/15/2021, 12:12 PM  Advanced Heart Failure Team Pager [phone] (M-F; 7a - 5p)  Please contact [facility] for night-coverage after hours (5p -7a ) and weekends on [email or web address]

## 2021-04-15 NOTE — Progress Notes (Signed)
Patient stated he was having trouble urinating. Bladder scan showed >838 in bladder. In and out catheterization was performed and 1654m urine was drained. Notified Dr. PFlorene Glenof the above.

## 2021-04-15 NOTE — Progress Notes (Signed)
PROGRESS NOTE    [name]  VZD:[id number] DOB: February 18, 1936 DOA: 04/12/2021 PCP: [name], DO   Chief Complaint  Patient presents with   Leg Swelling   Brief Narrative:  Joshua [name] is Joshua [name] 85 y.o. male with medical history significant of chronic diastolic CHF, chronic right-sided CHF, hypotension on midodrine, COPD, BPH, CKD stage II, PE on Eliquis, presented with worsening of bilateral leg swelling.   Patient has had refractory right-sided CHF, has been following with cardiologist, despite being on Lasix, patient gained> 20 pounds in last 4 to 5 weeks.  Yesterday, patient went to see cardiologist, and received 1 dose of Lasix injection, and sent to ED for further management. patient denied any chest pain, no shortness of breath no cough no fever chills.  Denies any urinary symptoms or diarrhea. ED Course: Blood pressure borderline low, blood work showed worsening of kidney function creatinine 2.1, K4.9.  Chest x-ray negative for acute infiltrates.   Assessment & Plan:   Principal Problem:   Acute on chronic right-sided heart failure (HCC) Active Problems:   Pulmonary HTN (HCC)   Acute on chronic diastolic (congestive) heart failure (HCC)   Acute kidney injury superimposed on CKD (HCC)  Acute on chronic diastolic and right-sided CHF acute decompensation  Pulmonary Hypertension - echo 09/2020 with EF 55-60%, LVH, IV septum flattened in systole and diastole c/w RV pressure and volume overload.  RVSF severely reduced.  Moderately elevated PASP.  - RHC 11/2020 with moderate pulm artery HTN, group 3 vs group 4 - cardiology c/s, appreciate recs - lasix 80 mg BID, metolazone per cardiology, unna boots, fluid restriction, midodrine for BP support - per cards - Strict I/O, daily weights    AKI on CKD stage IIIb -baseline creatinine appears to be around 1.3-1.6 - 2.13 at presentation, has improved close to his baseline - improving - follow, w/u additionally as needed   History  of hypotension -Continue midodrine   History of PE -Continue Eliquis   BPH -Finasteride and Flomax -retaining 1.6 L today, place foley until ready for discharge (self caths at home)   COPD -Stable, no symptoms signs of acute exacerbation  OSA - apparently unable to tolerate CPAP  DVT prophylaxis: eliquis Code Status: full  Family Communication:none at bedside Disposition:   Status is: Inpatient  Remains inpatient appropriate because:Inpatient level of care appropriate due to severity of illness  Dispo: The patient is from: Home              Anticipated d/c is to: Home              Patient currently is not medically stable to d/c.   Difficult to place patient No       Consultants:  cards  Procedures:    Antimicrobials: Anti-infectives (From admission, onward)    None          Subjective: No complaints today  Objective: Vitals:   04/15/21 0824 04/15/21 1000 04/15/21 1404 04/15/21 1405  BP:  96/64 106/70   Pulse:  86  87  Resp:  17  18  Temp:  (!) 97.4 F (36.3 C)  97.6 F (36.4 C)  TempSrc:  Oral  Oral  SpO2: 96% 96%  97%  Weight:      Height:        Intake/Output Summary (Last 24 hours) at 04/15/2021 1704 Last data filed at 04/15/2021 1530 Gross per 24 hour  Intake 1620 ml  Output 5775 ml  Net -4155 ml  Filed Weights   04/13/21 0500 04/14/21 0500 04/15/21 0416  Weight: 107.8 kg 107.1 kg 103.1 kg    Examination:  General: No acute distress. Cardiovascular: Heart sounds show Jonte Wollam regular rate, and rhythm. Lungs: Clear to auscultation bilaterally  Abdomen: Soft, nontender, nondistended  Neurological: Alert and oriented 3. Moves all extremities 4 . Cranial nerves II through XII grossly intact. Skin: Warm and dry. No rashes or lesions. Extremities: bilateral LE edema, unna boots     Data Reviewed: I have personally reviewed following labs and imaging studies  CBC: Recent Labs  Lab 04/12/21 1435 04/14/21 0325  WBC 3.9* 3.5*   NEUTROABS 2.8 2.0  HGB 15.0 14.6  HCT 47.1 44.6  MCV 89.4 86.6  PLT 161 671    Basic Metabolic Panel: Recent Labs  Lab 04/11/21 1047 04/12/21 1435 04/13/21 0154 04/14/21 0325 04/15/21 0520  NA 139 137 139 138 137  K 4.7 4.9 4.0 3.8 3.3*  CL 101 100 100 99 94*  CO2 32 30 32 30 36*  GLUCOSE 91 107* 86 80 90  BUN 33* 33* 30* 26* 21  CREATININE 2.23* 2.13* 1.66* 1.48* 1.41*  CALCIUM 8.9 9.1 9.1 9.0 9.1  MG  --   --   --  1.5* 2.1  PHOS  --   --   --  3.5  --     GFR: Estimated Creatinine Clearance: 51.6 mL/min (Leanah Kolander) (by C-G formula based on SCr of 1.41 mg/dL (H)).  Liver Function Tests: Recent Labs  Lab 04/14/21 0325  AST 20  ALT 12  ALKPHOS 92  BILITOT 1.2  PROT 5.6*  ALBUMIN 2.6*    CBG: No results for input(s): GLUCAP in the last 168 hours.   Recent Results (from the past 240 hour(s))  Resp Panel by RT-PCR (Flu Jaree Dwight&B, Covid) Nasopharyngeal Swab     Status: None   Collection Time: 04/12/21  3:45 PM   Specimen: Nasopharyngeal Swab; Nasopharyngeal(NP) swabs in vial transport medium  Result Value Ref Range Status   SARS Coronavirus 2 by RT PCR NEGATIVE NEGATIVE Final    Comment: (NOTE) SARS-CoV-2 target nucleic acids are NOT DETECTED.  The SARS-CoV-2 RNA is generally detectable in upper respiratory specimens during the acute phase of infection. The lowest concentration of SARS-CoV-2 viral copies this assay can detect is 138 copies/mL. Betzalel Umbarger negative result does not preclude SARS-Cov-2 infection and should not be used as the sole basis for treatment or other patient management decisions. Brandi Armato negative result may occur with  improper specimen collection/handling, submission of specimen other than nasopharyngeal swab, presence of viral mutation(s) within the areas targeted by this assay, and inadequate number of viral copies(<138 copies/mL). Rubin Dais negative result must be combined with clinical observations, patient history, and epidemiological information. The expected  result is Negative.  Fact Sheet for Patients:  EntrepreneurPulse.com.au  Fact Sheet for Healthcare Providers:  IncredibleEmployment.be  This test is no t yet approved or cleared by the [place] FDA and  has been authorized for detection and/or diagnosis of SARS-CoV-2 by FDA under an Emergency Use Authorization (EUA). This EUA will remain  in effect (meaning this test can be used) for the duration of the COVID-19 declaration under Section 564(b)(1) of the Act, 21 U.S.C.section 360bbb-3(b)(1), unless the authorization is terminated  or revoked sooner.       Influenza Javid Kemler by PCR NEGATIVE NEGATIVE Final   Influenza B by PCR NEGATIVE NEGATIVE Final    Comment: (NOTE) The Xpert Xpress SARS-CoV-2/FLU/RSV plus assay is intended as  an aid in the diagnosis of influenza from Nasopharyngeal swab specimens and should not be used as Samella Lucchetti sole basis for treatment. Nasal washings and aspirates are unacceptable for Xpert Xpress SARS-CoV-2/FLU/RSV testing.  Fact Sheet for Patients: EntrepreneurPulse.com.au  Fact Sheet for Healthcare Providers: IncredibleEmployment.be  This test is not yet approved or cleared by the [place] FDA and has been authorized for detection and/or diagnosis of SARS-CoV-2 by FDA under an Emergency Use Authorization (EUA). This EUA will remain in effect (meaning this test can be used) for the duration of the COVID-19 declaration under Section 564(b)(1) of the Act, 21 U.S.C. section 360bbb-3(b)(1), unless the authorization is terminated or revoked.  Performed at [facility], [place] [address], [place], [place] [address]          Radiology Studies: No results found.      Scheduled Meds:  allopurinol  100 mg Oral q morning   apixaban  5 mg Oral BID   arformoterol  15 mcg Nebulization BID   And   umeclidinium bromide  1 puff Inhalation Daily   [START ON 04/16/2021]  Chlorhexidine Gluconate Cloth  6 each Topical Q0600   finasteride  5 mg Oral QHS   furosemide  80 mg Intravenous BID   gabapentin  100 mg Oral TID   magnesium oxide  400 mg Oral q morning   midodrine  2.5 mg Oral TID WC   pantoprazole  40 mg Oral q morning   potassium chloride  40 mEq Oral Q4H   Riociguat  1.5 mg Oral TID   sodium chloride flush  3 mL Intravenous Q12H   tamsulosin  0.4 mg Oral QPC supper   Continuous Infusions:  sodium chloride       LOS: 3 days    Time spent: over 30 min    Fayrene Helper, MD [facility]   To contact the attending provider between 7A-7P or the covering provider during after hours 7P-7A, please log into the web site [email or web address] and access using universal [place] password for that web site. If you do not have the password, please call the hospital operator.  04/15/2021, 5:04 PM

## 2021-04-16 ENCOUNTER — Telehealth (HOSPITAL_COMMUNITY): Payer: Self-pay | Admitting: Pharmacy Technician

## 2021-04-16 ENCOUNTER — Other Ambulatory Visit (HOSPITAL_COMMUNITY): Payer: Self-pay

## 2021-04-16 DIAGNOSIS — I50813 Acute on chronic right heart failure: Secondary | ICD-10-CM | POA: Diagnosis not present

## 2021-04-16 DIAGNOSIS — I5033 Acute on chronic diastolic (congestive) heart failure: Secondary | ICD-10-CM

## 2021-04-16 LAB — BASIC METABOLIC PANEL
Anion gap: 7 (ref 5–15)
BUN: 19 mg/dL (ref 8–23)
CO2: 36 mmol/L — ABNORMAL HIGH (ref 22–32)
Calcium: 9.1 mg/dL (ref 8.9–10.3)
Chloride: 93 mmol/L — ABNORMAL LOW (ref 98–111)
Creatinine, Ser: 1.39 mg/dL — ABNORMAL HIGH (ref 0.61–1.24)
GFR, Estimated: 50 mL/min — ABNORMAL LOW (ref >60)
Glucose, Bld: 95 mg/dL (ref 70–99)
Potassium: 3.7 mmol/L (ref 3.5–5.1)
Sodium: 136 mmol/L (ref 135–145)

## 2021-04-16 LAB — CBC
HCT: 44.4 % (ref 39.0–52.0)
Hemoglobin: 14.8 g/dL (ref 13.0–17.0)
MCH: 28.5 pg (ref 26.0–34.0)
MCHC: 33.3 g/dL (ref 30.0–36.0)
MCV: 85.4 fL (ref 80.0–100.0)
Platelets: 153 10*3/uL (ref 150–400)
RBC: 5.2 MIL/uL (ref 4.22–5.81)
RDW: 15.9 % — ABNORMAL HIGH (ref 11.5–15.5)
WBC: 4 10*3/uL (ref 4.0–10.5)
nRBC: 0 % (ref 0.0–0.2)

## 2021-04-16 LAB — MAGNESIUM: Magnesium: 1.8 mg/dL (ref 1.7–2.4)

## 2021-04-16 MED ORDER — MIDODRINE HCL 5 MG PO TABS
2.5000 mg | ORAL_TABLET | Freq: Once | ORAL | Status: AC
  Administered 2021-04-16: 2.5 mg via ORAL
  Filled 2021-04-16: qty 1

## 2021-04-16 MED ORDER — MIDODRINE HCL 5 MG PO TABS
5.0000 mg | ORAL_TABLET | Freq: Three times a day (TID) | ORAL | Status: DC
  Administered 2021-04-16: 5 mg via ORAL
  Filled 2021-04-16: qty 1

## 2021-04-16 MED ORDER — TORSEMIDE 20 MG PO TABS
80.0000 mg | ORAL_TABLET | Freq: Two times a day (BID) | ORAL | Status: DC
  Administered 2021-04-16 (×2): 80 mg via ORAL
  Filled 2021-04-16 (×2): qty 4

## 2021-04-16 MED ORDER — DAPAGLIFLOZIN PROPANEDIOL 10 MG PO TABS
10.0000 mg | ORAL_TABLET | Freq: Every day | ORAL | Status: DC
  Administered 2021-04-16 – 2021-04-17 (×2): 10 mg via ORAL
  Filled 2021-04-16 (×3): qty 1

## 2021-04-16 MED ORDER — ALUM & MAG HYDROXIDE-SIMETH 200-200-20 MG/5ML PO SUSP
30.0000 mL | ORAL | Status: DC | PRN
  Administered 2021-04-16: 30 mL via ORAL
  Filled 2021-04-16: qty 30

## 2021-04-16 MED ORDER — POTASSIUM CHLORIDE CRYS ER 20 MEQ PO TBCR
40.0000 meq | EXTENDED_RELEASE_TABLET | Freq: Every day | ORAL | Status: DC
  Administered 2021-04-16 – 2021-04-17 (×2): 40 meq via ORAL
  Filled 2021-04-16 (×2): qty 2

## 2021-04-16 NOTE — Progress Notes (Signed)
Orthopedic Tech Progress Note Patient Details:  Joshua [name] [birth year] [id number]  Ortho Devices Type of Ortho Device: Louretta Parma boot Ortho Device/Splint Location: BLE Ortho Device/Splint Interventions: Ordered, Application, Adjustment   Post Interventions Patient Tolerated: Well Instructions Provided: [facility] [place] 04/16/2021, 4:57 PM

## 2021-04-16 NOTE — Telephone Encounter (Signed)
Advanced Heart Failure Patient Advocate Encounter  Prior Authorization for Wilder Glade has been submitted and approved.    PA#  [id number] Effective dates: 03/17/21 through 04/16/22  Patients co-pay is $15 (90 days) A copay card should drop it to $0.  Charlann Boxer, CPhT

## 2021-04-16 NOTE — TOC Benefit Eligibility Note (Signed)
Transition of Care [facility]) Benefit Eligibility Note    Patient Details  Name: Joshua [name] MRN: [id number] Date of Birth: 1936-05-16   Medication/Dose: Wilder Glade  10 MG BID  Covered?: No     Prescription Coverage [facility]  ,  [place] with Person/Company/Phone Number:: SUSAN  @  [facility] RX #  [phone]     Prior Approval: Yes [phone])          Memory Argue Phone Number: 04/16/2021, 12:01 PM

## 2021-04-16 NOTE — Progress Notes (Signed)
   04/16/21 0920  Assess: MEWS Score  BP (!) 74/52  ECG Heart Rate 84  Assess: MEWS Score  MEWS Temp 0  MEWS Systolic 2  MEWS Pulse 0  MEWS RR 0  MEWS LOC 0  MEWS Score 2  MEWS Score Color Yellow  Assess: if the MEWS score is Yellow or Red  Were vital signs taken at a resting state? Yes  Focused Assessment No change from prior assessment  Early Detection of Sepsis Score *See Row Information* Low  MEWS guidelines implemented *See Row Information* Yes  Treat  MEWS Interventions Administered scheduled meds/treatments  Pain Scale 0-10  Pain Score 0  Take Vital Signs  Increase Vital Sign Frequency  Yellow: Q 2hr X 2 then Q 4hr X 2, if remains yellow, continue Q 4hrs  Escalate  MEWS: Escalate Yellow: discuss with charge nurse/RN and consider discussing with provider and RRT  Notify: Charge Nurse/RN  Name of Charge Nurse/RN Notified Saranya Harlin RN  Date Charge Nurse/RN Notified 04/16/21  Time Charge Nurse/RN Notified 0920  Notify: Provider  Provider Name/Title Amy NP  Date Provider Notified 04/16/21  Time Provider Notified 1127  Notification Type Page  Notification Reason Change in status  Provider response See new orders  Date of Provider Response 04/16/21  Time of Provider Response 1610  Document  Patient Outcome Stabilized after interventions  Progress note created (see row info) Yes  BP soft. Pt denied dizziness or light headedness.  Administered scheduled midodrine with slight improvement. HF team Amy NP made aware.

## 2021-04-16 NOTE — Progress Notes (Signed)
PROGRESS NOTE    TYRE BEAVER  BFX:[id number] DOB: 1935/10/18 DOA: 04/12/2021 PCP: [name], DO   Chief Complaint  Patient presents with   Leg Swelling   Brief Narrative:  TRIG MCBRYAR is Sondi Desch 85 y.o. male with medical history significant of chronic diastolic CHF, chronic right-sided CHF, hypotension on midodrine, COPD, BPH, CKD stage II, PE on Eliquis, presented with worsening of bilateral leg swelling.   Patient has had refractory right-sided CHF, has been following with cardiologist, despite being on Lasix, patient gained> 20 pounds in last 4 to 5 weeks.  Yesterday, patient went to see cardiologist, and received 1 dose of Lasix injection, and sent to ED for further management. patient denied any chest pain, no shortness of breath no cough no fever chills.  Denies any urinary symptoms or diarrhea. ED Course: Blood pressure borderline low, blood work showed worsening of kidney function creatinine 2.1, K4.9.  Chest x-ray negative for acute infiltrates.   Assessment & Plan:   Principal Problem:   Acute on chronic right-sided heart failure (HCC) Active Problems:   Pulmonary HTN (HCC)   Acute on chronic diastolic (congestive) heart failure (HCC)   Acute kidney injury superimposed on CKD (HCC)  Acute on chronic diastolic and right-sided CHF acute decompensation  Pulmonary Hypertension - echo 09/2020 with EF 55-60%, LVH, IV septum flattened in systole and diastole c/w RV pressure and volume overload.  RVSF severely reduced.  Moderately elevated PASP.  - RHC 11/2020 with moderate pulm artery HTN, group 3 vs group 4 - cardiology c/s, appreciate recs - transition to PO torsemide, farxiga added, unna boots, fluid restriction, midodrine for BP support - per cards - Strict I/O, daily weights    AKI on CKD stage IIIb -baseline creatinine appears to be around 1.3-1.6 - 2.13 at presentation, has improved close to his baseline - improving - follow, w/u additionally as needed   History  of hypotension -Continue midodrine -BP in 06'Y-04'H systolic, midodrine increased to 5 mg - he's asymptomatic - follow    History of PE -Continue Eliquis   BPH -Finasteride and Flomax -retaining 1.6 L 8/28, place foley until ready for discharge (self caths at home)   COPD -Stable, no symptoms signs of acute exacerbation  OSA - apparently unable to tolerate CPAP  DVT prophylaxis: eliquis Code Status: full  Family Communication:none at bedside Disposition:   Status is: Inpatient  Remains inpatient appropriate because:Inpatient level of care appropriate due to severity of illness  Dispo: The patient is from: Home              Anticipated d/c is to: Home              Patient currently is not medically stable to d/c.   Difficult to place patient No       Consultants:  cards  Procedures:    Antimicrobials: Anti-infectives (From admission, onward)    None          Subjective: No LH, no complaints  Objective: Vitals:   04/16/21 1100 04/16/21 1306 04/16/21 1439 04/16/21 1443  BP: (!) 88/55 (!) 89/54 102/65 (!) 88/60  Pulse:  85    Resp:  18    Temp:  97.9 F (36.6 C)    TempSrc:  Oral    SpO2:      Weight:      Height:        Intake/Output Summary (Last 24 hours) at 04/16/2021 1728 Last data filed at 04/16/2021 1500 Gross per  24 hour  Intake 1323 ml  Output 2800 ml  Net -1477 ml   Filed Weights   04/14/21 0500 04/15/21 0416 04/16/21 0515  Weight: 107.1 kg 103.1 kg 99.6 kg    Examination:  General: No acute distress. Cardiovascular: Heart sounds show Yoskar Murrillo regular rate, and rhythm.  Lungs: Clear to auscultation bilaterally Abdomen: Soft, nontender, nondistended  Neurological: Alert and oriented 3. Moves all extremities 4 with equal strength. Cranial nerves II through XII grossly intact. Skin: Warm and dry. No rashes or lesions. Extremities: bilateral LE edema, unna boots      Data Reviewed: I have personally reviewed following labs and  imaging studies  CBC: Recent Labs  Lab 04/12/21 1435 04/14/21 0325 04/16/21 0131  WBC 3.9* 3.5* 4.0  NEUTROABS 2.8 2.0  --   HGB 15.0 14.6 14.8  HCT 47.1 44.6 44.4  MCV 89.4 86.6 85.4  PLT 161 150 527    Basic Metabolic Panel: Recent Labs  Lab 04/12/21 1435 04/13/21 0154 04/14/21 0325 04/15/21 0520 04/16/21 0131  NA 137 139 138 137 136  K 4.9 4.0 3.8 3.3* 3.7  CL 100 100 99 94* 93*  CO2 30 32 30 36* 36*  GLUCOSE 107* 86 80 90 95  BUN 33* 30* 26* 21 19  CREATININE 2.13* 1.66* 1.48* 1.41* 1.39*  CALCIUM 9.1 9.1 9.0 9.1 9.1  MG  --   --  1.5* 2.1 1.8  PHOS  --   --  3.5  --   --     GFR: Estimated Creatinine Clearance: 48.1 mL/min (Ky Rumple) (by C-G formula based on SCr of 1.39 mg/dL (H)).  Liver Function Tests: Recent Labs  Lab 04/14/21 0325  AST 20  ALT 12  ALKPHOS 92  BILITOT 1.2  PROT 5.6*  ALBUMIN 2.6*    CBG: No results for input(s): GLUCAP in the last 168 hours.   Recent Results (from the past 240 hour(s))  Resp Panel by RT-PCR (Flu Timberly Yott&B, Covid) Nasopharyngeal Swab     Status: None   Collection Time: 04/12/21  3:45 PM   Specimen: Nasopharyngeal Swab; Nasopharyngeal(NP) swabs in vial transport medium  Result Value Ref Range Status   SARS Coronavirus 2 by RT PCR NEGATIVE NEGATIVE Final    Comment: (NOTE) SARS-CoV-2 target nucleic acids are NOT DETECTED.  The SARS-CoV-2 RNA is generally detectable in upper respiratory specimens during the acute phase of infection. The lowest concentration of SARS-CoV-2 viral copies this assay can detect is 138 copies/mL. Amariyana Heacox negative result does not preclude SARS-Cov-2 infection and should not be used as the sole basis for treatment or other patient management decisions. Ragina Fenter negative result may occur with  improper specimen collection/handling, submission of specimen other than nasopharyngeal swab, presence of viral mutation(s) within the areas targeted by this assay, and inadequate number of viral copies(<138 copies/mL).  Artemis Loyal negative result must be combined with clinical observations, patient history, and epidemiological information. The expected result is Negative.  Fact Sheet for Patients:  EntrepreneurPulse.com.au  Fact Sheet for Healthcare Providers:  IncredibleEmployment.be  This test is no t yet approved or cleared by the [place] FDA and  has been authorized for detection and/or diagnosis of SARS-CoV-2 by FDA under an Emergency Use Authorization (EUA). This EUA will remain  in effect (meaning this test can be used) for the duration of the COVID-19 declaration under Section 564(b)(1) of the Act, 21 U.S.C.section 360bbb-3(b)(1), unless the authorization is terminated  or revoked sooner.       Influenza Jayona Mccaig by  PCR NEGATIVE NEGATIVE Final   Influenza B by PCR NEGATIVE NEGATIVE Final    Comment: (NOTE) The Xpert Xpress SARS-CoV-2/FLU/RSV plus assay is intended as an aid in the diagnosis of influenza from Nasopharyngeal swab specimens and should not be used as Jenascia Bumpass sole basis for treatment. Nasal washings and aspirates are unacceptable for Xpert Xpress SARS-CoV-2/FLU/RSV testing.  Fact Sheet for Patients: EntrepreneurPulse.com.au  Fact Sheet for Healthcare Providers: IncredibleEmployment.be  This test is not yet approved or cleared by the [place] FDA and has been authorized for detection and/or diagnosis of SARS-CoV-2 by FDA under an Emergency Use Authorization (EUA). This EUA will remain in effect (meaning this test can be used) for the duration of the COVID-19 declaration under Section 564(b)(1) of the Act, 21 U.S.C. section 360bbb-3(b)(1), unless the authorization is terminated or revoked.  Performed at [facility], [place] [address] [place], [place] [address]          Radiology Studies: No results found.      Scheduled Meds:  allopurinol  100 mg Oral q morning   apixaban  5 mg Oral BID    arformoterol  15 mcg Nebulization BID   And   umeclidinium bromide  1 puff Inhalation Daily   Chlorhexidine Gluconate Cloth  6 each Topical Q0600   dapagliflozin propanediol  10 mg Oral Daily   finasteride  5 mg Oral QHS   gabapentin  100 mg Oral TID   magnesium oxide  400 mg Oral q morning   midodrine  5 mg Oral TID WC   pantoprazole  40 mg Oral q morning   potassium chloride  40 mEq Oral Daily   Riociguat  1.5 mg Oral TID   sodium chloride flush  3 mL Intravenous Q12H   tamsulosin  0.4 mg Oral QPC supper   torsemide  80 mg Oral BID   Continuous Infusions:  sodium chloride       LOS: 4 days    Time spent: over 30 min    Fayrene Helper, MD [facility]   To contact the attending provider between 7A-7P or the covering provider during after hours 7P-7A, please log into the web site [email or web address] and access using universal [place] password for that web site. If you do not have the password, please call the hospital operator.  04/16/2021, 5:28 PM

## 2021-04-16 NOTE — Progress Notes (Signed)
Patient ID: Joshua [name], male   DOB: 07/22/1936, 85 y.o.   MRN: [id number]     Advanced Heart Failure Rounding Note  PCP-Cardiologist: Joshua Mocha, MD   Subjective:    Admitted on 8/25 for management of acute on chronic right-sided heart failure with diuretics.   Excellent urine output on IV Lasix 80 bid yesterday. Weight down 8 pounds.   Has foley catheter now with urinary retention.   He feels like his breathing is back to baseline.    Objective:   Weight Range: 99.6 kg Body mass index is 27.44 kg/m.   Vital Signs:   Temp:  [97.4 F (36.3 C)-97.8 F (36.6 C)] 97.8 F (36.6 C) (08/29 0515) Pulse Rate:  [78-88] 84 (08/29 0750) Resp:  [16-18] 16 (08/29 0750) BP: (88-106)/(56-70) 88/56 (08/29 0515) SpO2:  [91 %-97 %] 91 % (08/29 0750) Weight:  [99.6 kg] 99.6 kg (08/29 0515) Last BM Date: 04/15/21  Weight change: Filed Weights   04/14/21 0500 04/15/21 0416 04/16/21 0515  Weight: 107.1 kg 103.1 kg 99.6 kg    Intake/Output:   Intake/Output Summary (Last 24 hours) at 04/16/2021 0820 Last data filed at 04/16/2021 0400 Gross per 24 hour  Intake 843 ml  Output 5175 ml  Net -4332 ml      Physical Exam    General: NAD Neck: No JVD, no thyromegaly or thyroid nodule.  Lungs: Clear to auscultation bilaterally with normal respiratory effort. CV: Nondisplaced PMI.  Heart regular S1/S2, no S3/S4, no murmur.  1+ ankle edema.  Abdomen: Soft, nontender, no hepatosplenomegaly, no distention.  Skin: Intact without lesions or rashes.  Neurologic: Alert and oriented x 3.  Psych: Normal affect. Extremities: No clubbing or cyanosis.  HEENT: Normal.   Telemetry   NSR 80-90s.  Personally reviewed   Labs    CBC Recent Labs    04/14/21 0325 04/16/21 0131  WBC 3.5* 4.0  NEUTROABS 2.0  --   HGB 14.6 14.8  HCT 44.6 44.4  MCV 86.6 85.4  PLT 150 144   Basic Metabolic Panel Recent Labs    04/14/21 0325 04/15/21 0520 04/16/21 0131  NA 138 137 136  K 3.8 3.3*  3.7  CL 99 94* 93*  CO2 30 36* 36*  GLUCOSE 80 90 95  BUN 26* 21 19  CREATININE 1.48* 1.41* 1.39*  CALCIUM 9.0 9.1 9.1  MG 1.5* 2.1 1.8  PHOS 3.5  --   --    Liver Function Tests Recent Labs    04/14/21 0325  AST 20  ALT 12  ALKPHOS 92  BILITOT 1.2  PROT 5.6*  ALBUMIN 2.6*   No results for input(s): LIPASE, AMYLASE in the last 72 hours. Cardiac Enzymes No results for input(s): CKTOTAL, CKMB, CKMBINDEX, TROPONINI in the last 72 hours.  BNP: BNP (last 3 results) Recent Labs    03/21/21 1056 04/11/21 1047 04/12/21 1435  BNP 1,487.5* 1,737.2* 1,597.1*    ProBNP (last 3 results) No results for input(s): PROBNP in the last 8760 hours.   D-Dimer No results for input(s): DDIMER in the last 72 hours. Hemoglobin A1C No results for input(s): HGBA1C in the last 72 hours. Fasting Lipid Panel No results for input(s): CHOL, HDL, LDLCALC, TRIG, CHOLHDL, LDLDIRECT in the last 72 hours. Thyroid Function Tests No results for input(s): TSH, T4TOTAL, T3FREE, THYROIDAB in the last 72 hours.  Invalid input(s): FREET3  Other results:   Imaging    No results found.   Medications:     Scheduled  Medications:  allopurinol  100 mg Oral q morning   apixaban  5 mg Oral BID   arformoterol  15 mcg Nebulization BID   And   umeclidinium bromide  1 puff Inhalation Daily   Chlorhexidine Gluconate Cloth  6 each Topical Q0600   dapagliflozin propanediol  10 mg Oral Daily   finasteride  5 mg Oral QHS   gabapentin  100 mg Oral TID   magnesium oxide  400 mg Oral q morning   midodrine  2.5 mg Oral TID WC   pantoprazole  40 mg Oral q morning   potassium chloride  40 mEq Oral Daily   Riociguat  1.5 mg Oral TID   sodium chloride flush  3 mL Intravenous Q12H   tamsulosin  0.4 mg Oral QPC supper   torsemide  80 mg Oral BID    Infusions:  sodium chloride      PRN Medications: sodium chloride, acetaminophen, ondansetron (ZOFRAN) IV, sodium chloride flush    Patient Profile    Joshua [name] is an 85 y/o male with a medical history significant for HFpEF, prior PE on Eliquis, pulmonary hypertension and right-sided heart failure who presented to [facility] for management of acute on chronic right-sided heart failure.   Assessment/Plan   1. Acute on Chronic HFpEF/RV failure: Echo (2/22) with EF 55-60%, IV septum flattened, severe RV enlargement, severely decreased RV function, PASP 57 mmHg.  RHC in 4/22 showed normal filling pressures. Admitted for a/c RV failure w/ markedly volume overload + poor response to home diuretics. He has diuresed well this admission, weight down about 19 lbs.  He feels better and does not look significantly volume overloaded. Creatinine stable at 1.39.  - Stop IV Lasix, start torsemide 80 mg bid.  - Add Farxiga 10 mg daily.  - Unna boots in place - Continue midodrine 2.5 tid with RV failure and soft BP.  2. AKI on Stage III CKD: Baseline SCr ~1.6. SCr elevated at 2.1 on admit, in setting of volume overload and RV failure. Scr back to baseline at 1.39 - continue midodrine for BP support, keep SBP >110 for renal perfusion 3. Pulmonary HTN: PAH noted on 4/22 RHC with PVR 6.1 WU.  This appears to be multifactorial with OSA, emphysema, hypoxemia with ambulation, and a suspected chronic PE involving the right middle lobe (group 3 and group 4 PH). Given the suspected mixed etiology with only 1 area of chronic thromboembolism (right middle lobe) as well as age, do not think that pulmonary thromboendarterectomy would be indicated.  Rheumatologic serologic workup was negative.  PFTs showed severe obstruction and moderate restriction, suggesting significant COPD. - Continue riociguat for CTEPH, 1.5 mg bid. Continue titration as outpatient.   - Continue w/ supp O2  4. OSA: untreated. Unable to tolerate CPAP  5. Emphysema: Prior smoker.  Emphysema on CT and severe obstruction on PFTs - He uses home oxygen at times.  6. Orthostatic Hypotension  - c/w midodrine +  leg compression  7. Chronic PE - on Eliquis 8. BPH with urinary retention: He has had issues with this in the past, often self caths 3-4x/day. Has foley catheter in now.  - On Flomax.  - Can probably discontinue foley at discharge and allow him to self-cath as needed until his appointment with urology on Thursday.   Transition to po diuretics today, home today or tomorrow and we will arrange followup.   Length of Stay: 4  Loralie Champagne, MD  04/16/2021, 8:20 AM  Advanced  Heart Failure Team Pager [phone] (M-F; 7a - 5p)  Please contact [facility] for night-coverage after hours (5p -7a ) and weekends on [email or web address]

## 2021-04-16 NOTE — Progress Notes (Signed)
Physical Therapy Treatment Patient Details Name: Joshua [name] MRN: [id number] DOB: Dec 27, 1935 Today's Date: 04/16/2021    History of Present Illness 85 y.o. male  presented to ED with worsening of bilateral leg swelling. In ED found to have Blood pressure borderline low, blood work showed worsening of kidney function creatinine 2.1, K4.9. Admitted 04/12/21  for treatment of acute on chronic right-sided heaert failure.  PMH: with medical history significant of chronic diastolic CHF, chronic right-sided CHF, hypotension on midodrine, COPD, BPH, CKD stage II, PE on Eliquis,    PT Comments    Pt reports fatigue on entry and that his BP has been low, however agreeable to therapy. In seated BP 102/65, however with standing drops to 80/61, does not rebound significantly with continued standing, denies dizziness. Returned to seated and BP rebounded to 88/60. Opted for seated exercise today.  Pt agreeable. D/c plan remains appropriate. PT will continue to follow acutely.     Follow Up Recommendations  No PT follow up;Supervision - Intermittent     Equipment Recommendations  None recommended by PT       Precautions / Restrictions Precautions Precautions: Fall Precaution Comments: Bilateral UNNA boots, is supposed to wear 2L O2 at home, however is not always compliant Restrictions Weight Bearing Restrictions: No    Mobility  Bed Mobility               General bed mobility comments: sitting up in recliner on entry    Transfers Overall transfer level: Needs assistance Equipment used: None Transfers: Sit to/from Stand Sit to Stand: Supervision         General transfer comment: supervision for safety, denies dizziness or lightheadedness         Balance Overall balance assessment: Mild deficits observed, not formally tested                                          Cognition Arousal/Alertness: Awake/alert Behavior During Therapy: WFL for tasks  assessed/performed Overall Cognitive Status: Within Functional Limits for tasks assessed                                        Exercises General Exercises - Lower Extremity Quad Sets: AROM;Both;10 reps;Seated Gluteal Sets: AROM;Both;10 reps;Seated Long Arc Quad: AROM;Both;10 reps;Seated Hip ABduction/ADduction: AROM;Both;10 reps;Seated Hip Flexion/Marching: AROM;Both;10 reps;Seated Toe Raises: AROM;Both;10 reps;Seated Heel Raises: AROM;Both;10 reps;Seated    General Comments General comments (skin integrity, edema, etc.): BP in seated  102/65 with standing 80/61, denies dizziness, after 3 min standing 82/62, with return to seated rebounded to 88/60      Pertinent Vitals/Pain Pain Assessment: No/denies pain     PT Goals (current goals can now be found in the care plan section) Acute Rehab PT Goals Patient Stated Goal: get back to grandson's football games PT Goal Formulation: With patient Time For Goal Achievement: 04/27/21 Potential to Achieve Goals: Good Progress towards PT goals: Not progressing toward goals - comment (limited by soft BP)    Frequency    Min 3X/week      PT Plan Current plan remains appropriate       AM-PAC PT "6 Clicks" Mobility   Outcome Measure  Help needed turning from your back to your side while in a flat bed without using bedrails?: None Help  needed moving from lying on your back to sitting on the side of a flat bed without using bedrails?: None Help needed moving to and from a bed to a chair (including a wheelchair)?: None Help needed standing up from a chair using your arms (e.g., wheelchair or bedside chair)?: None Help needed to walk in hospital room?: None Help needed climbing 3-5 steps with a railing? : A Little 6 Click Score: 23    End of Session Equipment Utilized During Treatment: Gait belt;Oxygen Activity Tolerance: Patient tolerated treatment well Patient left: in chair;with call bell/phone within  reach Nurse Communication: Mobility status PT Visit Diagnosis: Muscle weakness (generalized) (M62.81)     Time: 9810-2548 PT Time Calculation (min) (ACUTE ONLY): 19 min  Charges:  $Therapeutic Exercise: 8-22 mins                     Joshua [name] B. Migdalia Dk PT, DPT Acute Rehabilitation Services Pager [phone] Office [phone]    [place] 04/16/2021, 3:09 PM

## 2021-04-16 NOTE — Progress Notes (Signed)
Patient with pink urine in foley catheter.  Notified Dr. Myna Hidalgo and orders for CBC received.  Pt without complaints.

## 2021-04-17 ENCOUNTER — Other Ambulatory Visit (HOSPITAL_COMMUNITY): Payer: Self-pay

## 2021-04-17 DIAGNOSIS — I50813 Acute on chronic right heart failure: Secondary | ICD-10-CM | POA: Diagnosis not present

## 2021-04-17 LAB — BASIC METABOLIC PANEL
Anion gap: 8 (ref 5–15)
BUN: 22 mg/dL (ref 8–23)
CO2: 38 mmol/L — ABNORMAL HIGH (ref 22–32)
Calcium: 9.1 mg/dL (ref 8.9–10.3)
Chloride: 90 mmol/L — ABNORMAL LOW (ref 98–111)
Creatinine, Ser: 1.47 mg/dL — ABNORMAL HIGH (ref 0.61–1.24)
GFR, Estimated: 47 mL/min — ABNORMAL LOW (ref >60)
Glucose, Bld: 94 mg/dL (ref 70–99)
Potassium: 3.6 mmol/L (ref 3.5–5.1)
Sodium: 136 mmol/L (ref 135–145)

## 2021-04-17 LAB — CBC WITH DIFFERENTIAL/PLATELET
Abs Immature Granulocytes: 0.01 10*3/uL (ref 0.00–0.07)
Basophils Absolute: 0 10*3/uL (ref 0.0–0.1)
Basophils Relative: 0 %
Eosinophils Absolute: 0.2 10*3/uL (ref 0.0–0.5)
Eosinophils Relative: 4 %
HCT: 44.2 % (ref 39.0–52.0)
Hemoglobin: 14.7 g/dL (ref 13.0–17.0)
Immature Granulocytes: 0 %
Lymphocytes Relative: 17 %
Lymphs Abs: 0.8 10*3/uL (ref 0.7–4.0)
MCH: 28.6 pg (ref 26.0–34.0)
MCHC: 33.3 g/dL (ref 30.0–36.0)
MCV: 86 fL (ref 80.0–100.0)
Monocytes Absolute: 0.8 10*3/uL (ref 0.1–1.0)
Monocytes Relative: 17 %
Neutro Abs: 2.8 10*3/uL (ref 1.7–7.7)
Neutrophils Relative %: 62 %
Platelets: 161 10*3/uL (ref 150–400)
RBC: 5.14 MIL/uL (ref 4.22–5.81)
RDW: 16 % — ABNORMAL HIGH (ref 11.5–15.5)
WBC: 4.5 10*3/uL (ref 4.0–10.5)
nRBC: 0 % (ref 0.0–0.2)

## 2021-04-17 LAB — MAGNESIUM: Magnesium: 1.7 mg/dL (ref 1.7–2.4)

## 2021-04-17 MED ORDER — TORSEMIDE 20 MG PO TABS: 60.0000 mg | ORAL_TABLET | Freq: Every day | ORAL | Status: DC

## 2021-04-17 MED ORDER — TORSEMIDE 20 MG PO TABS
ORAL_TABLET | ORAL | 0 refills | Status: DC
  Filled 2021-04-17: qty 210, 30d supply, fill #0

## 2021-04-17 MED ORDER — DAPAGLIFLOZIN PROPANEDIOL 10 MG PO TABS
10.0000 mg | ORAL_TABLET | Freq: Every day | ORAL | 0 refills | Status: DC
  Filled 2021-04-17: qty 30, 30d supply, fill #0

## 2021-04-17 MED ORDER — MAGNESIUM SULFATE 2 GM/50ML IV SOLN
2.0000 g | Freq: Once | INTRAVENOUS | Status: AC
  Administered 2021-04-17: 2 g via INTRAVENOUS
  Filled 2021-04-17: qty 50

## 2021-04-17 MED ORDER — MIDODRINE HCL 5 MG PO TABS
10.0000 mg | ORAL_TABLET | Freq: Three times a day (TID) | ORAL | 0 refills | Status: DC
  Filled 2021-04-17: qty 180, 30d supply, fill #0

## 2021-04-17 MED ORDER — TORSEMIDE 20 MG PO TABS
ORAL_TABLET | ORAL | 0 refills | Status: DC
Start: 2021-04-18 — End: 2021-04-30
  Filled 2021-04-17: qty 210, 30d supply, fill #0

## 2021-04-17 MED ORDER — POTASSIUM CHLORIDE CRYS ER 20 MEQ PO TBCR
20.0000 meq | EXTENDED_RELEASE_TABLET | Freq: Once | ORAL | Status: AC
  Administered 2021-04-17: 20 meq via ORAL
  Filled 2021-04-17: qty 1

## 2021-04-17 MED ORDER — MIDODRINE HCL 5 MG PO TABS
10.0000 mg | ORAL_TABLET | Freq: Three times a day (TID) | ORAL | Status: DC
  Administered 2021-04-17 (×2): 10 mg via ORAL
  Filled 2021-04-17 (×2): qty 2

## 2021-04-17 MED ORDER — MIDODRINE HCL 10 MG PO TABS
10.0000 mg | ORAL_TABLET | Freq: Three times a day (TID) | ORAL | 0 refills | Status: DC
  Filled 2021-04-17: qty 90, 30d supply, fill #0

## 2021-04-17 MED ORDER — ADEMPAS 1.5 MG PO TABS: 1.5000 mg | ORAL_TABLET | Freq: Three times a day (TID) | ORAL | 0 refills | Status: DC

## 2021-04-17 MED ORDER — TORSEMIDE 20 MG PO TABS: 80.0000 mg | ORAL_TABLET | Freq: Every day | ORAL | Status: DC

## 2021-04-17 MED ORDER — TORSEMIDE 20 MG PO TABS
80.0000 mg | ORAL_TABLET | Freq: Every day | ORAL | Status: DC
  Filled 2021-04-17: qty 4

## 2021-04-17 NOTE — TOC Transition Note (Addendum)
Transition of Care [facility]) - CM/SW Discharge Note   Patient Details  Name: Joshua [name] MRN: [id number] Date of Birth: [birth year]  [facility]) CM/SW Contact:  Erenest Rasher, RN Phone Number: [phone] 04/17/2021, 11:07 AM   Clinical Narrative:     HF TOC CM spoke to pt and provide him with Wilder Glade $0 copay card. Pt will receive dc meds from [facility] prior to dc. Will have wife bring his portable for dc home. [place] to make aware of dc home today with resumption of HH.   [facility] brought his Wilder Glade and other meds were sent to his [facility]. Pt unit RN made him aware to pick up at [facility].   Final next level of care: Home w Home Health Services Barriers to Discharge: No Barriers Identified   Patient Goals and CMS Choice Patient states their goals for this hospitalization and ongoing recovery are:: wants to get home and back to his routine CMS Medicare.gov Compare Post Acute Care list provided to:: Patient Choice offered to / list presented to : Patient  Discharge Placement                       Discharge Plan and Services In-house Referral: Clinical Social Work Discharge Planning Services: CM Consult Post Acute Care Choice: Home Health                    HH Arranged: RN, PT [facility] Agency: [place] ([facility]) Date HH Agency Contacted: 04/17/21 Time HH Agency Contacted: 1106 Representative spoke with at [place]: [place] ([place]) Interventions     Readmission Risk Interventions No flowsheet data found.

## 2021-04-17 NOTE — Progress Notes (Signed)
  Midodrine increased to 10 mg tid.   SBP 70-80s.  Asymptomatic. No complaints of dizziness.   Can hold torsemide today then start back tomorrow.  Discussed Dr Aundra Dubin no other changes and he is ok for discharge.   Darrick Grinder  NP -C  10:53 AM

## 2021-04-17 NOTE — Progress Notes (Signed)
RT found patient on room air, sats 82%,. RT placed patient on 3L [place] sats imrpoved to 91%. RN notified.

## 2021-04-17 NOTE — Care Management Important Message (Signed)
Important Message  Patient Details  Name: Joshua [name] MRN: [id number] Date of Birth: Jul 15, 1936   Medicare Important Message Given:  Yes     [name] 04/17/2021, 10:07 AM

## 2021-04-17 NOTE — Progress Notes (Signed)
Physical Therapy Treatment Patient Details Name: Joshua [name] MRN: [id number] DOB: 1936-03-01 Today's Date: 04/17/2021    History of Present Illness 85 y.o. male  presented to ED with worsening of bilateral leg swelling. In ED found to have Blood pressure borderline low, blood work showed worsening of kidney function creatinine 2.1, K4.9. Admitted 04/12/21  for treatment of acute on chronic right-sided heaert failure.  PMH: with medical history significant of chronic diastolic CHF, chronic right-sided CHF, hypotension on midodrine, COPD, BPH, CKD stage II, PE on Eliquis,    PT Comments    Patient tolerating ambulation and stair training this session despite low BP initially 86/50, then 79/47 after ambulation with brief HR elevation to 150's per monitor, but artifact seen and pt asymptomatic.  SpO2 throughout 88 or above on 2L O2 up to 95% at times with ambulation.  Feel stable for home with family support.  Did discuss slow gradual increase in activity level, seated to shower with supervision and using electric cart when out due to weakness from hospitalization.  Has not used them in the past and walks to [place] and while in the store.  Encouraged cane at least when out. Will follow up if not d/c.   Follow Up Recommendations  No PT follow up;Supervision - Intermittent     Equipment Recommendations  None recommended by PT    Recommendations for Other Services       Precautions / Restrictions Precautions Precautions: Fall Precaution Comments: watch BP, UNNA boots, 2L O2 Restrictions Weight Bearing Restrictions: No    Mobility  Bed Mobility Overal bed mobility: Modified Independent                  Transfers Overall transfer level: Needs assistance Equipment used: None Transfers: Sit to/from Stand Sit to Stand: Supervision         General transfer comment: assist for safety with O2, telemetry lines and foley  Ambulation/Gait Ambulation/Gait assistance: Min  guard;Supervision Gait Distance (Feet): 150 Feet Assistive device: None Gait Pattern/deviations: Step-through pattern;Shuffle     General Gait Details: decreased foot clearance, slow pace, but steady no assistive device   Stairs Stairs: Yes Stairs assistance: Min guard Stair Management: One rail Right;Alternating pattern;Forwards Number of Stairs: 4 General stair comments: R rail as he has at home, assist for lines again and guarding for safety, though performed truly without physical assist   Wheelchair Mobility    Modified Rankin (Stroke Patients Only)       Balance Overall balance assessment: Mild deficits observed, not formally tested                                          Cognition Arousal/Alertness: Awake/alert Behavior During Therapy: WFL for tasks assessed/performed Overall Cognitive Status: Within Functional Limits for tasks assessed                                        Exercises      General Comments General comments (skin integrity, edema, etc.): BP 85/56 initially, after ambulation 79/47, pt denied symptoms, on 2L O2 throughout one drop to 88% after stair negotiation and HR reading up to 150 briefly, with some artifact noted, then back down quickly to 120, then 102 with pt denying symptoms      Pertinent Vitals/Pain  Pain Assessment: Faces Faces Pain Scale: Hurts a little bit Pain Location: L arm IV site Pain Descriptors / Indicators: Sore Pain Intervention(s): Monitored during session    Home Living                      Prior Function            PT Goals (current goals can now be found in the care plan section) Progress towards PT goals: Progressing toward goals    Frequency    Min 3X/week      PT Plan Current plan remains appropriate    Co-evaluation              AM-PAC PT "6 Clicks" Mobility   Outcome Measure  Help needed turning from your back to your side while in a flat bed  without using bedrails?: None Help needed moving from lying on your back to sitting on the side of a flat bed without using bedrails?: None Help needed moving to and from a bed to a chair (including a wheelchair)?: None Help needed standing up from a chair using your arms (e.g., wheelchair or bedside chair)?: None Help needed to walk in hospital room?: A Little Help needed climbing 3-5 steps with a railing? : A Little 6 Click Score: 22    End of Session Equipment Utilized During Treatment: Oxygen Activity Tolerance: Patient tolerated treatment well Patient left: in bed;with call bell/phone within reach   PT Visit Diagnosis: Muscle weakness (generalized) (M62.81)     Time: 1033-1050 PT Time Calculation (min) (ACUTE ONLY): 17 min  Charges:  $Gait Training: 8-22 mins                     Magda Kiel, PT Acute Rehabilitation Services [phone] Office:[phone] 04/17/2021    Reginia Naas 04/17/2021, 11:22 AM

## 2021-04-17 NOTE — Progress Notes (Addendum)
Patient ID: Joshua [name], male   DOB: Apr 24, 1936, 85 y.o.   MRN: [id number]     Advanced Heart Failure Rounding Note  PCP-Cardiologist: Sherren Mocha, MD   Subjective:    Admitted on 8/25 for management of acute on chronic right-sided heart failure with diuretics.   Yesterday switched to torsemide. Creatinine ok 1.4>1.47 . Weight unchanged.   Feels ok. Denies SOB.   Objective:   Weight Range: 98 kg Body mass index is 27.01 kg/m.   Vital Signs:   Temp:  [97.8 F (36.6 C)-98.2 F (36.8 C)] 98.2 F (36.8 C) (08/30 0433) Pulse Rate:  [84-85] 85 (08/29 1306) Resp:  [14-18] 14 (08/30 0433) BP: (74-102)/(51-65) 85/62 (08/30 0433) SpO2:  [91 %-93 %] 93 % (08/30 0433) Weight:  [98 kg] 98 kg (08/30 0630) Last BM Date: 04/16/21  Weight change: Filed Weights   04/16/21 0515 04/17/21 0500 04/17/21 0630  Weight: 99.6 kg 98 kg 98 kg    Intake/Output:   Intake/Output Summary (Last 24 hours) at 04/17/2021 0745 Last data filed at 04/17/2021 0631 Gross per 24 hour  Intake 1443 ml  Output 3500 ml  Net -2057 ml      Physical Exam   General:  Well appearing. No resp difficulty HEENT: normal Neck: supple. no JVD. Carotids 2+ bilat; no bruits. No lymphadenopathy or thryomegaly appreciated. Cor: PMI nondisplaced. Regular rate & rhythm. No rubs, gallops or murmurs. Lungs: clear Abdomen: soft, nontender, nondistended. No hepatosplenomegaly. No bruits or masses. Good bowel sounds. Extremities: no cyanosis, clubbing, rash, R and LLE unna boots.  Neuro: alert & orientedx3, cranial nerves grossly intact. moves all 4 extremities w/o difficulty. Affect pleasant GU: urine appear bloody  Telemetry  SR-ST 90-100s    Labs    CBC Recent Labs    04/16/21 0131 04/17/21 0210  WBC 4.0 4.5  NEUTROABS  --  2.8  HGB 14.8 14.7  HCT 44.4 44.2  MCV 85.4 86.0  PLT 153 301   Basic Metabolic Panel Recent Labs    04/16/21 0131 04/17/21 0210  NA 136 136  K 3.7 3.6  CL 93* 90*  CO2  36* 38*  GLUCOSE 95 94  BUN 19 22  CREATININE 1.39* 1.47*  CALCIUM 9.1 9.1  MG 1.8 1.7   Liver Function Tests No results for input(s): AST, ALT, ALKPHOS, BILITOT, PROT, ALBUMIN in the last 72 hours.  No results for input(s): LIPASE, AMYLASE in the last 72 hours. Cardiac Enzymes No results for input(s): CKTOTAL, CKMB, CKMBINDEX, TROPONINI in the last 72 hours.  BNP: BNP (last 3 results) Recent Labs    03/21/21 1056 04/11/21 1047 04/12/21 1435  BNP 1,487.5* 1,737.2* 1,597.1*    ProBNP (last 3 results) No results for input(s): PROBNP in the last 8760 hours.   D-Dimer No results for input(s): DDIMER in the last 72 hours. Hemoglobin A1C No results for input(s): HGBA1C in the last 72 hours. Fasting Lipid Panel No results for input(s): CHOL, HDL, LDLCALC, TRIG, CHOLHDL, LDLDIRECT in the last 72 hours. Thyroid Function Tests No results for input(s): TSH, T4TOTAL, T3FREE, THYROIDAB in the last 72 hours.  Invalid input(s): FREET3  Other results:   Imaging    No results found.   Medications:     Scheduled Medications:  allopurinol  100 mg Oral q morning   apixaban  5 mg Oral BID   arformoterol  15 mcg Nebulization BID   And   umeclidinium bromide  1 puff Inhalation Daily   Chlorhexidine Gluconate Cloth  6 each Topical Q0600   dapagliflozin propanediol  10 mg Oral Daily   finasteride  5 mg Oral QHS   gabapentin  100 mg Oral TID   magnesium oxide  400 mg Oral q morning   midodrine  5 mg Oral TID WC   pantoprazole  40 mg Oral q morning   potassium chloride  40 mEq Oral Daily   Riociguat  1.5 mg Oral TID   sodium chloride flush  3 mL Intravenous Q12H   tamsulosin  0.4 mg Oral QPC supper   torsemide  80 mg Oral BID    Infusions:  sodium chloride      PRN Medications: sodium chloride, acetaminophen, alum & mag hydroxide-simeth, ondansetron (ZOFRAN) IV, sodium chloride flush    Patient Profile   [name] is an 85 y/o male with a medical history  significant for HFpEF, prior PE on Eliquis, pulmonary hypertension and right-sided heart failure who presented to [facility] for management of acute on chronic right-sided heart failure.   Assessment/Plan   1. Acute on Chronic HFpEF/RV failure: Echo (2/22) with EF 55-60%, IV septum flattened, severe RV enlargement, severely decreased RV function, PASP 57 mmHg.  RHC in 4/22 showed normal filling pressures. Admitted for a/c RV failure w/ markedly volume overload + poor response to home diuretics. He has diuresed well this admission, weight down about 19 lbs.   Renal function stable.  - Volume status stable. Continue torsemide 80 mg in am and cut back pm dose to 60 mg.   - Continue Farxiga 10 mg daily.  - Continue midodrine  10  tid with RV failure and soft BP.  - Can take off unna boots and place ted hose.  2. AKI on Stage III CKD: Baseline SCr ~1.6. SCr elevated at 2.1 on admit, in setting of volume overload and RV failure. Scr back to baseline at 1.47.  - continue midodrine for BP support, keep SBP >110 for renal perfusion. Increase midodrine to 10 mg tid.  3. Pulmonary HTN: PAH noted on 4/22 RHC with PVR 6.1 WU.  This appears to be multifactorial with OSA, emphysema, hypoxemia with ambulation, and a suspected chronic PE involving the right middle lobe (group 3 and group 4 PH). Given the suspected mixed etiology with only 1 area of chronic thromboembolism (right middle lobe) as well as age, do not think that pulmonary thromboendarterectomy would be indicated.  Rheumatologic serologic workup was negative.  PFTs showed severe obstruction and moderate restriction, suggesting significant COPD. - Continue riociguat for CTEPH, 1.5 mg bid. Continue titration as outpatient.   - Continue w/ supp O2  4. OSA: untreated. Unable to tolerate CPAP  5. Emphysema: Prior smoker.  Emphysema on CT and severe obstruction on PFTs - He uses home oxygen at times.  6. Orthostatic Hypotension  - c/w midodrine + leg compression   7. Chronic PE - on Eliquis 8. BPH with urinary retention: He has had issues with this in the past, often self caths 3-4x/day. Has foley catheter in now.  - On Flomax.  - Blood noted in foley.  - Can probably discontinue foley at discharge and allow him to self-cath as needed until his appointment with urology on Thursday.   [place] for d/c per Dr Aundra Dubin.    Length of Stay: 5  Amy Clegg, NP  04/17/2021, 7:45 AM  Advanced Heart Failure Team Pager [phone] (M-F; 7a - 5p)  Please contact [facility] for night-coverage after hours (5p -7a ) and weekends on  [email or web address]   Patient seen with NP, agree with the above note.   SBP 80s-90s generally, denies lightheadedness.  Breathing back to baseline.  Creatinine stable at 1.47.   General: NAD Neck: No JVD, no thyromegaly or thyroid nodule.  Lungs: Clear to auscultation bilaterally with normal respiratory effort. CV: Nondisplaced PMI.  Heart regular S1/S2, no S3/S4, no murmur.  No peripheral edema.   Abdomen: Soft, nontender, no hepatosplenomegaly, no distention.  Skin: Intact without lesions or rashes.  Neurologic: Alert and oriented x 3.  Psych: Normal affect. Extremities: No clubbing or cyanosis.  HEENT: Normal.   Continue midodrine 10 mg tid, ok for SBP 85 or above if asymptomatic given RV failure.   He looks euvolemic and creatinine is stable.  He is off oxygen.   I think he can go home today.  Will arrange followup in CHF clinic.  Cardiac meds for home: midodrine 10 tid, Farxiga 10 daily, torsemide 80 qam/60 qpm, riociguat 1.5 tid, apixaban 5 bid, KCl 40 daily, magnesium oxide 400 daily.   Loralie Champagne 04/17/2021 8:02 AM

## 2021-04-17 NOTE — Discharge Summary (Signed)
Physician Discharge Summary  Joshua [name] KXF:[id number] DOB: 12-28-1935 DOA: 04/12/2021  PCP: [name], DO  Admit date: 04/12/2021 Discharge date: 04/17/2021  Time spent: 40 minutes  Recommendations for Outpatient Follow-up:  Follow outpatient CBC/CMP Follow with cardiology outpatient Attention to blood pressure outpatient - midodrine increased to 10 mg TID today, SBP ranged from 70s-90s, asymptomatic  Hold torsemide today, resume 8/31 Started on farxiga, follow outpatient  Discharge Diagnoses:  Principal Problem:   Acute on chronic right-sided heart failure ([place]) Active Problems:   Pulmonary HTN ([place])   Acute on chronic diastolic (congestive) heart failure ([place])   Acute kidney injury superimposed on CKD [facility])   Discharge Condition: stable  Diet recommendation: heart healthy  Filed Weights   04/16/21 0515 04/17/21 0500 04/17/21 0630  Weight: 99.6 kg 98 kg 98 kg    History of present illness:  Joshua [name] is Joshua [name] 85 y.o. male with medical history significant of chronic diastolic CHF, chronic right-sided CHF, hypotension on midodrine, COPD, BPH, CKD stage II, PE on Eliquis, presented with worsening of bilateral leg swelling.   He was admitted for Joshua [name] heart failure exacerbation.  He was aggressively diuresed with lasix/metolazone, had unna boots, farxiga added.  Midodrine was increased for hypotension.  Cardiology recommending holding diuretics today, ok with discharge, outpatient follow up.  Hospital Course:  Acute on chronic diastolic and right-sided CHF acute decompensation  Pulmonary Hypertension - echo 09/2020 with EF 55-60%, LVH, IV septum flattened in systole and diastole c/w RV pressure and volume overload.  RVSF severely reduced.  Moderately elevated PASP.  - RHC 11/2020 with moderate pulm artery HTN, group 3 vs group 4 - cardiology c/s, appreciate recs - transition to PO torsemide (hold today given hypotension - resume tomorrow), farxiga added, unna boot -> ted  hose at discharge, fluid restriction, midodrine for BP support (increased to 10 mg TID today) - per cards - Strict I/O, daily weights    hypotension -Continue midodrine -> increased to 10 mg TID today per cards - BP fluctuating from 70s-90s - he's asymptomatic - case discussed with cardiology who note he's ok for discharge on current regimen, hold torsemide today, resume diuretics tomorrow   AKI on CKD stage IIIb -baseline creatinine appears to be around 1.3-1.6 - 2.13 at presentation, has improved close to his baseline, fluctuating - follow outpatient  - follow, w/u additionally as needed   History of PE -Continue Eliquis   BPH -continue home meds (proscar and doxazosin) -resume self cathing tomorrow   COPD -Stable, no symptoms signs of acute exacerbation   OSA - apparently unable to tolerate CPAP  Procedures: stable (i.e. Studies not automatically included, echos, thoracentesis, etc; not x-rays)  Consultations: Heart healthy  Discharge Exam: Vitals:   04/17/21 1033 04/17/21 1300  BP: (!) 85/56 93/61  Pulse: 89 98  Resp: 17 16  Temp:    SpO2: 90% 92%   No LH, SOB Discussed d/c plan and recommendations  General: No acute distress. Cardiovascular: Heart sounds show Joshua [name] regular rate, and rhythm.  Lungs: Clear to auscultation bilaterally  Abdomen: Soft, nontender, nondistended Neurological: Alert and oriented 3. Moves all extremities 4 . Cranial nerves II through XII grossly intact. Skin: Warm and dry. No rashes or lesions. Extremities: bilateral unna boots, trace edema  Discharge Instructions   Discharge Instructions     (HEART FAILURE PATIENTS) Call MD:  Anytime you have any of the following symptoms: 1) 3 pound weight gain in 24 hours or 5 pounds in 1  week 2) shortness of breath, with or without Joshua [name] dry hacking cough 3) swelling in the hands, feet or stomach 4) if you have to sleep on extra pillows at night in order to breathe.   Complete by: As directed     Call MD for:  difficulty breathing, headache or visual disturbances   Complete by: As directed    Call MD for:  extreme fatigue   Complete by: As directed    Call MD for:  hives   Complete by: As directed    Call MD for:  persistant dizziness or light-headedness   Complete by: As directed    Call MD for:  persistant nausea and vomiting   Complete by: As directed    Call MD for:  redness, tenderness, or signs of infection (pain, swelling, redness, odor or green/yellow discharge around incision site)   Complete by: As directed    Call MD for:  severe uncontrolled pain   Complete by: As directed    Call MD for:  temperature >100.4   Complete by: As directed    Diet - low sodium heart healthy   Complete by: As directed    Diet - low sodium heart healthy   Complete by: As directed    Discharge instructions   Complete by: As directed    You were seen for heart failure.  You were diuresed (fluid taken off).   We'll planning for discharge for you at this time.  Your blood pressures are low.  We're increasing your midodrine to 10 mg three times daily today.  Hold your torsemide today.    Please follow up with cardiology.    Return for new, recurrent, or worsening symptoms.  Please ask your PCP to request records from this hospitalization so they know what was done and what the next steps will be.   Increase activity slowly   Complete by: As directed    Increase activity slowly   Complete by: As directed       Allergies as of 04/17/2021       Reactions   Other Swelling   Beer- Swelling    Sunflower Oil Swelling   Sulfa Antibiotics Rash        Medication List     STOP taking these medications    ibuprofen 200 MG tablet Commonly known as: ADVIL   tamsulosin 0.4 MG Caps capsule Commonly known as: FLOMAX       TAKE these medications    Adempas 1.5 MG Tabs Generic drug: Riociguat Take 1.5 mg by mouth in the morning, at noon, and at bedtime. What changed: Another  medication with the same name was removed. Continue taking this medication, and follow the directions you see here.   allopurinol 100 MG tablet Commonly known as: ZYLOPRIM Take 100 mg by mouth every morning.   apixaban 5 MG Tabs tablet Commonly known as: ELIQUIS Take 1 tablet (5 mg total) by mouth 2 (two) times daily.   Azelastine HCl 0.15 % Soln Place 2 sprays into the nose daily as needed (allergies).   Cinnamon 500 MG capsule Take 500 mg by mouth every morning.   doxazosin 4 MG tablet Commonly known as: CARDURA Take 4 mg by mouth daily.   Farxiga 10 MG Tabs tablet Generic drug: dapagliflozin propanediol Take 1 tablet (10 mg total) by mouth daily. Start taking on: April 18, 2021   finasteride 5 MG tablet Commonly known as: PROSCAR Take 5 mg by mouth at bedtime.  gabapentin 100 MG capsule Commonly known as: NEURONTIN Take 200 mg by mouth 3 (three) times daily.   ipratropium 0.03 % nasal spray Commonly known as: ATROVENT Place 1 spray into both nostrils daily as needed (allergies).   magnesium oxide 400 (240 Mg) MG tablet Commonly known as: MAG-OX Take 800 mg by mouth daily.   midodrine 5 MG tablet Commonly known as: PROAMATINE Take 2 tablets (10 mg total) by mouth 3 (three) times daily with meals. What changed:  medication strength how much to take   pantoprazole 40 MG tablet Commonly known as: PROTONIX Take 40 mg by mouth every morning.   potassium chloride 10 MEQ tablet Commonly known as: KLOR-CON Take 2 tablets (20 mEq total) by mouth 2 (two) times daily.   Spiriva Respimat 1.25 MCG/ACT Aers Generic drug: Tiotropium Bromide Monohydrate Inhale 2 puffs into the lungs every evening.   torsemide 20 MG tablet Commonly known as: DEMADEX Take 4 tablets (80 mg total) by mouth every morning AND 3 tablets (60 mg total) every evening. Take one extra tablet ( 20 mg ) for extra 3 lbs in one day.. Start taking on: April 18, 2021 What changed: See the new  instructions.       Allergies  Allergen Reactions   Other Swelling    Beer- Swelling    Sunflower Oil Swelling   Sulfa Antibiotics Rash    Follow-up Information     [facility] Follow up on 04/26/2021.   Specialty: Cardiology Why: Advanced Heart Failure Clinic at [facility] at 10 am Entrance C, Garage Code 6734 Contact information: [address] [phone] [place] [phone] [place], [place] Follow up.   Why: Home Health RN and Physical Therapy-will call to arrange follow up visits Contact information: [place] [address] [phone]                  The results of significant diagnostics from this hospitalization (including imaging, microbiology, ancillary and laboratory) are listed below for reference.    Significant Diagnostic Studies: DG Chest 2 View  Result Date: 04/12/2021 CLINICAL DATA:  Shortness of breath EXAM: CHEST - 2 VIEW COMPARISON:  Chest radiograph 01/03/2021, chest CT 12/02/2020 FINDINGS: Unchanged, enlarged cardiac silhouette with prominent pulmonary arteries. There is no focal airspace consolidation. There are changes of emphysema. Prominent pulmonary vasculature. No large pleural effusion or visible pneumothorax. No acute osseous abnormality. Thoracic spondylosis IMPRESSION: Cardiomegaly with pulmonary vascular congestion. Electronically Signed   By: Maurine Simmering M.D.   On: 04/12/2021 15:11   LONG TERM MONITOR (3-14 DAYS)  Result Date: 03/23/2021 Patch Wear Time:  13 days and 23 hours (2022-07-13T13:10:04-399 to 2022-07-27T12:33:11-398) Patient had Elizbeth Posa min HR of 46 bpm, max HR of 218 bpm, and avg HR of 83 bpm. Predominant underlying rhythm was Sinus Rhythm. Bundle Branch Block/IVCD was present. 2 Ventricular Tachycardia runs occurred, the run with the fastest interval lasting 10 beats with Sativa Gelles max rate of 160 bpm (avg  120 bpm); the run with the fastest interval was also the longest. 17 Supraventricular Tachycardia runs occurred, the run with the fastest interval lasting 9.7 secs with Jalesia Loudenslager max rate of 218 bpm (avg 160 bpm); the run with the  fastest interval was also the longest. Isolated SVEs were occasional (4.3%, 65425), SVE Couplets were rare (<1.0%, 1201), and SVE Triplets were rare (<1.0%, 71). Isolated VEs were rare (<  1.0%), VE Couplets were rare (<1.0%), and no VE Triplets were Present. Impression: 1. Predominant NSR. 2. 2 short NSVT runs. 3. Few short SVT runs. 4. Occasional PACs (4.3%). 5. Rare PVCs   Microbiology: Recent Results (from the past 240 hour(s))  Resp Panel by RT-PCR (Flu Anjali Manzella&B, Covid) Nasopharyngeal Swab     Status: None   Collection Time: 04/12/21  3:45 PM   Specimen: Nasopharyngeal Swab; Nasopharyngeal(NP) swabs in vial transport medium  Result Value Ref Range Status   SARS Coronavirus 2 by RT PCR NEGATIVE NEGATIVE Final    Comment: (NOTE) SARS-CoV-2 target nucleic acids are NOT DETECTED.  The SARS-CoV-2 RNA is generally detectable in upper respiratory specimens during the acute phase of infection. The lowest concentration of SARS-CoV-2 viral copies this assay can detect is 138 copies/mL. Thierry Dobosz negative result does not preclude SARS-Cov-2 infection and should not be used as the sole basis for treatment or other patient management decisions. Hernando Reali negative result may occur with  improper specimen collection/handling, submission of specimen other than nasopharyngeal swab, presence of viral mutation(s) within the areas targeted by this assay, and inadequate number of viral copies(<138 copies/mL). Princetta Uplinger negative result must be combined with clinical observations, patient history, and epidemiological information. The expected result is Negative.  Fact Sheet for Patients:  EntrepreneurPulse.com.au  Fact Sheet for Healthcare Providers:   IncredibleEmployment.be  This test is no t yet approved or cleared by the [place] FDA and  has been authorized for detection and/or diagnosis of SARS-CoV-2 by FDA under an Emergency Use Authorization (EUA). This EUA will remain  in effect (meaning this test can be used) for the duration of the COVID-19 declaration under Section 564(b)(1) of the Act, 21 U.S.C.section 360bbb-3(b)(1), unless the authorization is terminated  or revoked sooner.       Influenza Nene Aranas by PCR NEGATIVE NEGATIVE Final   Influenza B by PCR NEGATIVE NEGATIVE Final    Comment: (NOTE) The Xpert Xpress SARS-CoV-2/FLU/RSV plus assay is intended as an aid in the diagnosis of influenza from Nasopharyngeal swab specimens and should not be used as Trestan Vahle sole basis for treatment. Nasal washings and aspirates are unacceptable for Xpert Xpress SARS-CoV-2/FLU/RSV testing.  Fact Sheet for Patients: EntrepreneurPulse.com.au  Fact Sheet for Healthcare Providers: IncredibleEmployment.be  This test is not yet approved or cleared by the [place] FDA and has been authorized for detection and/or diagnosis of SARS-CoV-2 by FDA under an Emergency Use Authorization (EUA). This EUA will remain in effect (meaning this test can be used) for the duration of the COVID-19 declaration under Section 564(b)(1) of the Act, 21 U.S.C. section 360bbb-3(b)(1), unless the authorization is terminated or revoked.  Performed at [facility], [place] [address] [place], [place] [address]      Labs: Basic Metabolic Panel: Recent Labs  Lab 04/13/21 0154 04/14/21 0325 04/15/21 0520 04/16/21 0131 04/17/21 0210  NA 139 138 137 136 136  K 4.0 3.8 3.3* 3.7 3.6  CL 100 99 94* 93* 90*  CO2 32 30 36* 36* 38*  GLUCOSE 86 80 90 95 94  BUN 30* 26* _0 CREATININE 1.66* 1.48* 1.41* 1.39* 1.47*  CALCIUM 9.1 9.0 9.1 9.1 9.1  MG  --  1.5* 2.1 1.8 1.7  PHOS  --  3.5  --   --   --     Liver Function Tests: Recent Labs  Lab 04/14/21 0325  AST 20  ALT 12  ALKPHOS 92  BILITOT 1.2  PROT 5.6*  ALBUMIN  2.6*   No results for input(s): LIPASE, AMYLASE in the last 168 hours. No results for input(s): AMMONIA in the last 168 hours. CBC: Recent Labs  Lab 04/12/21 1435 04/14/21 0325 04/16/21 0131 04/17/21 0210  WBC 3.9* 3.5* 4.0 4.5  NEUTROABS 2.8 2.0  --  2.8  HGB 15.0 14.6 14.8 14.7  HCT 47.1 44.6 44.4 44.2  MCV 89.4 86.6 85.4 86.0  PLT 161 150 153 161   Cardiac Enzymes: No results for input(s): CKTOTAL, CKMB, CKMBINDEX, TROPONINI in the last 168 hours. BNP: BNP (last 3 results) Recent Labs    03/21/21 1056 04/11/21 1047 04/12/21 1435  BNP 1,487.5* 1,737.2* 1,597.1*    ProBNP (last 3 results) No results for input(s): PROBNP in the last 8760 hours.  CBG: No results for input(s): GLUCAP in the last 168 hours.     Signed:  Fayrene Helper MD.  [facility] 04/17/2021, 2:08 PM

## 2021-04-18 ENCOUNTER — Encounter (HOSPITAL_COMMUNITY): Payer: Medicare Other

## 2021-04-18 DIAGNOSIS — J449 Chronic obstructive pulmonary disease, unspecified: Secondary | ICD-10-CM | POA: Diagnosis not present

## 2021-04-18 DIAGNOSIS — E119 Type 2 diabetes mellitus without complications: Secondary | ICD-10-CM | POA: Diagnosis not present

## 2021-04-18 DIAGNOSIS — K219 Gastro-esophageal reflux disease without esophagitis: Secondary | ICD-10-CM | POA: Diagnosis not present

## 2021-04-18 DIAGNOSIS — I5032 Chronic diastolic (congestive) heart failure: Secondary | ICD-10-CM | POA: Diagnosis not present

## 2021-04-19 ENCOUNTER — Inpatient Hospital Stay (HOSPITAL_COMMUNITY): Payer: Medicare Other

## 2021-04-19 ENCOUNTER — Other Ambulatory Visit: Payer: Self-pay

## 2021-04-19 ENCOUNTER — Encounter (HOSPITAL_COMMUNITY): Payer: Self-pay | Admitting: *Deleted

## 2021-04-19 ENCOUNTER — Emergency Department (HOSPITAL_COMMUNITY): Payer: Medicare Other

## 2021-04-19 ENCOUNTER — Inpatient Hospital Stay (HOSPITAL_COMMUNITY)
Admission: EM | Admit: 2021-04-19 | Discharge: 2021-04-30 | DRG: 871 | Disposition: A | Discharge status: Home Health | Payer: Medicare Other | Attending: Internal Medicine | Admitting: Internal Medicine

## 2021-04-19 DIAGNOSIS — R000 Tachycardia, unspecified: Secondary | ICD-10-CM | POA: Diagnosis not present

## 2021-04-19 DIAGNOSIS — I2724 Chronic thromboembolic pulmonary hypertension: Secondary | ICD-10-CM | POA: Diagnosis present

## 2021-04-19 DIAGNOSIS — Z8672 Personal history of thrombophlebitis: Secondary | ICD-10-CM

## 2021-04-19 DIAGNOSIS — G4700 Insomnia, unspecified: Secondary | ICD-10-CM | POA: Diagnosis present

## 2021-04-19 DIAGNOSIS — Z96652 Presence of left artificial knee joint: Secondary | ICD-10-CM | POA: Diagnosis present

## 2021-04-19 DIAGNOSIS — I951 Orthostatic hypotension: Secondary | ICD-10-CM | POA: Diagnosis present

## 2021-04-19 DIAGNOSIS — R570 Cardiogenic shock: Secondary | ICD-10-CM | POA: Diagnosis not present

## 2021-04-19 DIAGNOSIS — I2781 Cor pulmonale (chronic): Secondary | ICD-10-CM | POA: Diagnosis present

## 2021-04-19 DIAGNOSIS — R0902 Hypoxemia: Secondary | ICD-10-CM | POA: Diagnosis not present

## 2021-04-19 DIAGNOSIS — R6521 Severe sepsis with septic shock: Secondary | ICD-10-CM | POA: Diagnosis not present

## 2021-04-19 DIAGNOSIS — Z9181 History of falling: Secondary | ICD-10-CM | POA: Diagnosis not present

## 2021-04-19 DIAGNOSIS — F05 Delirium due to known physiological condition: Secondary | ICD-10-CM | POA: Diagnosis not present

## 2021-04-19 DIAGNOSIS — N401 Enlarged prostate with lower urinary tract symptoms: Secondary | ICD-10-CM | POA: Diagnosis present

## 2021-04-19 DIAGNOSIS — R188 Other ascites: Secondary | ICD-10-CM | POA: Diagnosis not present

## 2021-04-19 DIAGNOSIS — Z66 Do not resuscitate: Secondary | ICD-10-CM | POA: Diagnosis present

## 2021-04-19 DIAGNOSIS — N138 Other obstructive and reflux uropathy: Secondary | ICD-10-CM | POA: Diagnosis present

## 2021-04-19 DIAGNOSIS — R0689 Other abnormalities of breathing: Secondary | ICD-10-CM | POA: Diagnosis not present

## 2021-04-19 DIAGNOSIS — Z515 Encounter for palliative care: Secondary | ICD-10-CM | POA: Diagnosis not present

## 2021-04-19 DIAGNOSIS — I5082 Biventricular heart failure: Secondary | ICD-10-CM | POA: Diagnosis present

## 2021-04-19 DIAGNOSIS — Z452 Encounter for adjustment and management of vascular access device: Secondary | ICD-10-CM

## 2021-04-19 DIAGNOSIS — E875 Hyperkalemia: Secondary | ICD-10-CM

## 2021-04-19 DIAGNOSIS — Z9049 Acquired absence of other specified parts of digestive tract: Secondary | ICD-10-CM | POA: Diagnosis not present

## 2021-04-19 DIAGNOSIS — Z8249 Family history of ischemic heart disease and other diseases of the circulatory system: Secondary | ICD-10-CM

## 2021-04-19 DIAGNOSIS — I50810 Right heart failure, unspecified: Secondary | ICD-10-CM

## 2021-04-19 DIAGNOSIS — Z882 Allergy status to sulfonamides status: Secondary | ICD-10-CM

## 2021-04-19 DIAGNOSIS — R112 Nausea with vomiting, unspecified: Secondary | ICD-10-CM | POA: Diagnosis not present

## 2021-04-19 DIAGNOSIS — K56699 Other intestinal obstruction unspecified as to partial versus complete obstruction: Secondary | ICD-10-CM

## 2021-04-19 DIAGNOSIS — E871 Hypo-osmolality and hyponatremia: Secondary | ICD-10-CM | POA: Diagnosis present

## 2021-04-19 DIAGNOSIS — K7200 Acute and subacute hepatic failure without coma: Secondary | ICD-10-CM | POA: Diagnosis not present

## 2021-04-19 DIAGNOSIS — I2721 Secondary pulmonary arterial hypertension: Secondary | ICD-10-CM | POA: Diagnosis present

## 2021-04-19 DIAGNOSIS — R1110 Vomiting, unspecified: Secondary | ICD-10-CM | POA: Diagnosis not present

## 2021-04-19 DIAGNOSIS — J449 Chronic obstructive pulmonary disease, unspecified: Secondary | ICD-10-CM | POA: Diagnosis not present

## 2021-04-19 DIAGNOSIS — Z7901 Long term (current) use of anticoagulants: Secondary | ICD-10-CM

## 2021-04-19 DIAGNOSIS — Z7189 Other specified counseling: Secondary | ICD-10-CM | POA: Diagnosis not present

## 2021-04-19 DIAGNOSIS — E440 Moderate protein-calorie malnutrition: Secondary | ICD-10-CM | POA: Diagnosis not present

## 2021-04-19 DIAGNOSIS — Z4800 Encounter for change or removal of nonsurgical wound dressing: Secondary | ICD-10-CM | POA: Diagnosis not present

## 2021-04-19 DIAGNOSIS — I700 Atherosclerosis of aorta: Secondary | ICD-10-CM | POA: Diagnosis present

## 2021-04-19 DIAGNOSIS — I272 Pulmonary hypertension, unspecified: Secondary | ICD-10-CM | POA: Diagnosis not present

## 2021-04-19 DIAGNOSIS — E861 Hypovolemia: Secondary | ICD-10-CM | POA: Diagnosis present

## 2021-04-19 DIAGNOSIS — K567 Ileus, unspecified: Secondary | ICD-10-CM | POA: Diagnosis present

## 2021-04-19 DIAGNOSIS — I130 Hypertensive heart and chronic kidney disease with heart failure and stage 1 through stage 4 chronic kidney disease, or unspecified chronic kidney disease: Secondary | ICD-10-CM | POA: Diagnosis not present

## 2021-04-19 DIAGNOSIS — K3189 Other diseases of stomach and duodenum: Secondary | ICD-10-CM | POA: Diagnosis not present

## 2021-04-19 DIAGNOSIS — Q438 Other specified congenital malformations of intestine: Secondary | ICD-10-CM | POA: Diagnosis not present

## 2021-04-19 DIAGNOSIS — N183 Chronic kidney disease, stage 3 unspecified: Secondary | ICD-10-CM | POA: Diagnosis present

## 2021-04-19 DIAGNOSIS — I10 Essential (primary) hypertension: Secondary | ICD-10-CM | POA: Diagnosis not present

## 2021-04-19 DIAGNOSIS — Z86711 Personal history of pulmonary embolism: Secondary | ICD-10-CM | POA: Diagnosis not present

## 2021-04-19 DIAGNOSIS — J9611 Chronic respiratory failure with hypoxia: Secondary | ICD-10-CM | POA: Diagnosis present

## 2021-04-19 DIAGNOSIS — I509 Heart failure, unspecified: Secondary | ICD-10-CM | POA: Diagnosis not present

## 2021-04-19 DIAGNOSIS — Z20822 Contact with and (suspected) exposure to covid-19: Secondary | ICD-10-CM | POA: Diagnosis present

## 2021-04-19 DIAGNOSIS — A419 Sepsis, unspecified organism: Secondary | ICD-10-CM | POA: Diagnosis not present

## 2021-04-19 DIAGNOSIS — R042 Hemoptysis: Secondary | ICD-10-CM | POA: Diagnosis present

## 2021-04-19 DIAGNOSIS — K449 Diaphragmatic hernia without obstruction or gangrene: Secondary | ICD-10-CM | POA: Diagnosis not present

## 2021-04-19 DIAGNOSIS — R7989 Other specified abnormal findings of blood chemistry: Secondary | ICD-10-CM | POA: Diagnosis present

## 2021-04-19 DIAGNOSIS — G4730 Sleep apnea, unspecified: Secondary | ICD-10-CM | POA: Diagnosis not present

## 2021-04-19 DIAGNOSIS — Z87891 Personal history of nicotine dependence: Secondary | ICD-10-CM

## 2021-04-19 DIAGNOSIS — K219 Gastro-esophageal reflux disease without esophagitis: Secondary | ICD-10-CM | POA: Diagnosis present

## 2021-04-19 DIAGNOSIS — K6389 Other specified diseases of intestine: Secondary | ICD-10-CM | POA: Diagnosis not present

## 2021-04-19 DIAGNOSIS — I959 Hypotension, unspecified: Secondary | ICD-10-CM | POA: Diagnosis present

## 2021-04-19 DIAGNOSIS — I2782 Chronic pulmonary embolism: Secondary | ICD-10-CM | POA: Diagnosis present

## 2021-04-19 DIAGNOSIS — K439 Ventral hernia without obstruction or gangrene: Secondary | ICD-10-CM | POA: Diagnosis present

## 2021-04-19 DIAGNOSIS — R0602 Shortness of breath: Secondary | ICD-10-CM | POA: Diagnosis not present

## 2021-04-19 DIAGNOSIS — R778 Other specified abnormalities of plasma proteins: Secondary | ICD-10-CM | POA: Diagnosis not present

## 2021-04-19 DIAGNOSIS — I2699 Other pulmonary embolism without acute cor pulmonale: Secondary | ICD-10-CM | POA: Diagnosis not present

## 2021-04-19 DIAGNOSIS — Z79899 Other long term (current) drug therapy: Secondary | ICD-10-CM

## 2021-04-19 DIAGNOSIS — D696 Thrombocytopenia, unspecified: Secondary | ICD-10-CM | POA: Diagnosis present

## 2021-04-19 DIAGNOSIS — R531 Weakness: Secondary | ICD-10-CM | POA: Diagnosis not present

## 2021-04-19 DIAGNOSIS — I4510 Unspecified right bundle-branch block: Secondary | ICD-10-CM | POA: Diagnosis not present

## 2021-04-19 DIAGNOSIS — N1831 Chronic kidney disease, stage 3a: Secondary | ICD-10-CM | POA: Diagnosis not present

## 2021-04-19 DIAGNOSIS — N170 Acute kidney failure with tubular necrosis: Secondary | ICD-10-CM | POA: Diagnosis not present

## 2021-04-19 DIAGNOSIS — I714 Abdominal aortic aneurysm, without rupture: Secondary | ICD-10-CM | POA: Diagnosis present

## 2021-04-19 DIAGNOSIS — I5032 Chronic diastolic (congestive) heart failure: Secondary | ICD-10-CM | POA: Diagnosis present

## 2021-04-19 DIAGNOSIS — S81812D Laceration without foreign body, left lower leg, subsequent encounter: Secondary | ICD-10-CM | POA: Diagnosis not present

## 2021-04-19 DIAGNOSIS — G4733 Obstructive sleep apnea (adult) (pediatric): Secondary | ICD-10-CM | POA: Diagnosis present

## 2021-04-19 DIAGNOSIS — R627 Adult failure to thrive: Secondary | ICD-10-CM | POA: Diagnosis present

## 2021-04-19 DIAGNOSIS — Q439 Congenital malformation of intestine, unspecified: Secondary | ICD-10-CM

## 2021-04-19 DIAGNOSIS — M1990 Unspecified osteoarthritis, unspecified site: Secondary | ICD-10-CM | POA: Diagnosis not present

## 2021-04-19 DIAGNOSIS — E1122 Type 2 diabetes mellitus with diabetic chronic kidney disease: Secondary | ICD-10-CM | POA: Diagnosis present

## 2021-04-19 DIAGNOSIS — Z0389 Encounter for observation for other suspected diseases and conditions ruled out: Secondary | ICD-10-CM | POA: Diagnosis not present

## 2021-04-19 DIAGNOSIS — Z9981 Dependence on supplemental oxygen: Secondary | ICD-10-CM

## 2021-04-19 DIAGNOSIS — J439 Emphysema, unspecified: Secondary | ICD-10-CM | POA: Diagnosis present

## 2021-04-19 DIAGNOSIS — N179 Acute kidney failure, unspecified: Secondary | ICD-10-CM

## 2021-04-19 DIAGNOSIS — Z6827 Body mass index (BMI) 27.0-27.9, adult: Secondary | ICD-10-CM

## 2021-04-19 DIAGNOSIS — I50812 Chronic right heart failure: Secondary | ICD-10-CM | POA: Diagnosis present

## 2021-04-19 DIAGNOSIS — R338 Other retention of urine: Secondary | ICD-10-CM | POA: Diagnosis present

## 2021-04-19 DIAGNOSIS — J432 Centrilobular emphysema: Secondary | ICD-10-CM | POA: Diagnosis not present

## 2021-04-19 DIAGNOSIS — I517 Cardiomegaly: Secondary | ICD-10-CM | POA: Diagnosis not present

## 2021-04-19 DIAGNOSIS — R7401 Elevation of levels of liver transaminase levels: Secondary | ICD-10-CM

## 2021-04-19 DIAGNOSIS — N3289 Other specified disorders of bladder: Secondary | ICD-10-CM | POA: Diagnosis not present

## 2021-04-19 DIAGNOSIS — R579 Shock, unspecified: Secondary | ICD-10-CM | POA: Diagnosis not present

## 2021-04-19 DIAGNOSIS — I712 Thoracic aortic aneurysm, without rupture: Secondary | ICD-10-CM | POA: Diagnosis not present

## 2021-04-19 DIAGNOSIS — Z91018 Allergy to other foods: Secondary | ICD-10-CM

## 2021-04-19 DIAGNOSIS — J9610 Chronic respiratory failure, unspecified whether with hypoxia or hypercapnia: Secondary | ICD-10-CM | POA: Diagnosis present

## 2021-04-19 DIAGNOSIS — N400 Enlarged prostate without lower urinary tract symptoms: Secondary | ICD-10-CM | POA: Diagnosis not present

## 2021-04-19 DIAGNOSIS — I110 Hypertensive heart disease with heart failure: Secondary | ICD-10-CM | POA: Diagnosis not present

## 2021-04-19 DIAGNOSIS — E876 Hypokalemia: Secondary | ICD-10-CM | POA: Diagnosis not present

## 2021-04-19 LAB — CBC WITH DIFFERENTIAL/PLATELET
Abs Immature Granulocytes: 0.07 10*3/uL (ref 0.00–0.07)
Basophils Absolute: 0 10*3/uL (ref 0.0–0.1)
Basophils Relative: 0 %
Eosinophils Absolute: 0 10*3/uL (ref 0.0–0.5)
Eosinophils Relative: 0 %
HCT: 45.2 % (ref 39.0–52.0)
Hemoglobin: 14.7 g/dL (ref 13.0–17.0)
Immature Granulocytes: 1 %
Lymphocytes Relative: 3 %
Lymphs Abs: 0.4 10*3/uL — ABNORMAL LOW (ref 0.7–4.0)
MCH: 29 pg (ref 26.0–34.0)
MCHC: 32.5 g/dL (ref 30.0–36.0)
MCV: 89.2 fL (ref 80.0–100.0)
Monocytes Absolute: 0.9 10*3/uL (ref 0.1–1.0)
Monocytes Relative: 7 %
Neutro Abs: 10.8 10*3/uL — ABNORMAL HIGH (ref 1.7–7.7)
Neutrophils Relative %: 89 %
Platelets: 117 10*3/uL — ABNORMAL LOW (ref 150–400)
RBC: 5.07 MIL/uL (ref 4.22–5.81)
RDW: 16.6 % — ABNORMAL HIGH (ref 11.5–15.5)
WBC: 12.1 10*3/uL — ABNORMAL HIGH (ref 4.0–10.5)
nRBC: 0 % (ref 0.0–0.2)

## 2021-04-19 LAB — TROPONIN I (HIGH SENSITIVITY)
Troponin I (High Sensitivity): 247 ng/L (ref <18)
Troponin I (High Sensitivity): 499 ng/L (ref <18)
Troponin I (High Sensitivity): 717 ng/L (ref <18)
Troponin I (High Sensitivity): 893 ng/L (ref <18)

## 2021-04-19 LAB — BASIC METABOLIC PANEL
Anion gap: 11 (ref 5–15)
Anion gap: 9 (ref 5–15)
BUN: 44 mg/dL — ABNORMAL HIGH (ref 8–23)
BUN: 45 mg/dL — ABNORMAL HIGH (ref 8–23)
CO2: 34 mmol/L — ABNORMAL HIGH (ref 22–32)
CO2: 34 mmol/L — ABNORMAL HIGH (ref 22–32)
Calcium: 9.5 mg/dL (ref 8.9–10.3)
Calcium: 9.5 mg/dL (ref 8.9–10.3)
Chloride: 89 mmol/L — ABNORMAL LOW (ref 98–111)
Chloride: 90 mmol/L — ABNORMAL LOW (ref 98–111)
Creatinine, Ser: 3.74 mg/dL — ABNORMAL HIGH (ref 0.61–1.24)
Creatinine, Ser: 3.87 mg/dL — ABNORMAL HIGH (ref 0.61–1.24)
GFR, Estimated: 15 mL/min — ABNORMAL LOW (ref >60)
GFR, Estimated: 15 mL/min — ABNORMAL LOW (ref >60)
Glucose, Bld: 103 mg/dL — ABNORMAL HIGH (ref 70–99)
Glucose, Bld: 71 mg/dL (ref 70–99)
Potassium: 6.2 mmol/L — ABNORMAL HIGH (ref 3.5–5.1)
Potassium: 4.8 mmol/L (ref 3.5–5.1)
Sodium: 134 mmol/L — ABNORMAL LOW (ref 135–145)
Sodium: 133 mmol/L — ABNORMAL LOW (ref 135–145)

## 2021-04-19 LAB — COMPREHENSIVE METABOLIC PANEL
ALT: 197 U/L — ABNORMAL HIGH (ref 0–44)
AST: 416 U/L — ABNORMAL HIGH (ref 15–41)
Albumin: 3.1 g/dL — ABNORMAL LOW (ref 3.5–5.0)
Alkaline Phosphatase: 107 U/L (ref 38–126)
Anion gap: 17 — ABNORMAL HIGH (ref 5–15)
BUN: 41 mg/dL — ABNORMAL HIGH (ref 8–23)
CO2: 29 mmol/L (ref 22–32)
Calcium: 9.8 mg/dL (ref 8.9–10.3)
Chloride: 89 mmol/L — ABNORMAL LOW (ref 98–111)
Creatinine, Ser: 3.69 mg/dL — ABNORMAL HIGH (ref 0.61–1.24)
GFR, Estimated: 16 mL/min — ABNORMAL LOW (ref >60)
Glucose, Bld: 117 mg/dL — ABNORMAL HIGH (ref 70–99)
Potassium: 5.5 mmol/L — ABNORMAL HIGH (ref 3.5–5.1)
Sodium: 135 mmol/L (ref 135–145)
Total Bilirubin: 2.3 mg/dL — ABNORMAL HIGH (ref 0.3–1.2)
Total Protein: 6.8 g/dL (ref 6.5–8.1)

## 2021-04-19 LAB — CBG MONITORING, ED: Glucose-Capillary: 92 mg/dL (ref 70–99)

## 2021-04-19 LAB — HEPATITIS PANEL, ACUTE
HCV Ab: NONREACTIVE
Hep A IgM: NONREACTIVE
Hep B C IgM: NONREACTIVE
Hepatitis B Surface Ag: NONREACTIVE

## 2021-04-19 LAB — BRAIN NATRIURETIC PEPTIDE: B Natriuretic Peptide: 2618 pg/mL — ABNORMAL HIGH (ref 0.0–100.0)

## 2021-04-19 LAB — RESP PANEL BY RT-PCR (FLU A&B, COVID) ARPGX2
Influenza A by PCR: NEGATIVE
Influenza B by PCR: NEGATIVE
SARS Coronavirus 2 by RT PCR: NEGATIVE

## 2021-04-19 LAB — LIPASE, BLOOD: Lipase: 24 U/L (ref 11–51)

## 2021-04-19 LAB — MAGNESIUM: Magnesium: 2.1 mg/dL (ref 1.7–2.4)

## 2021-04-19 MED ORDER — SODIUM ZIRCONIUM CYCLOSILICATE 10 G PO PACK
10.0000 g | PACK | Freq: Once | ORAL | Status: AC
  Administered 2021-04-19: 10 g via ORAL
  Filled 2021-04-19: qty 1

## 2021-04-19 MED ORDER — SODIUM CHLORIDE 0.9% FLUSH
3.0000 mL | Freq: Two times a day (BID) | INTRAVENOUS | Status: DC
  Administered 2021-04-19 – 2021-04-30 (×21): 3 mL via INTRAVENOUS

## 2021-04-19 MED ORDER — MIDODRINE HCL 5 MG PO TABS
15.0000 mg | ORAL_TABLET | Freq: Three times a day (TID) | ORAL | Status: DC
  Administered 2021-04-19 – 2021-04-30 (×33): 15 mg via ORAL
  Filled 2021-04-19 (×34): qty 3

## 2021-04-19 MED ORDER — SODIUM CHLORIDE 0.9 % IV BOLUS
500.0000 mL | Freq: Once | INTRAVENOUS | Status: AC
  Administered 2021-04-19: 500 mL via INTRAVENOUS

## 2021-04-19 MED ORDER — PANTOPRAZOLE SODIUM 40 MG PO TBEC
40.0000 mg | DELAYED_RELEASE_TABLET | Freq: Every morning | ORAL | Status: DC
  Administered 2021-04-19 – 2021-04-30 (×11): 40 mg via ORAL
  Filled 2021-04-19 (×10): qty 1

## 2021-04-19 MED ORDER — GABAPENTIN 100 MG PO CAPS
100.0000 mg | ORAL_CAPSULE | Freq: Three times a day (TID) | ORAL | Status: DC
  Administered 2021-04-19 – 2021-04-22 (×7): 100 mg via ORAL
  Filled 2021-04-19 (×8): qty 1

## 2021-04-19 MED ORDER — APIXABAN 5 MG PO TABS
5.0000 mg | ORAL_TABLET | Freq: Two times a day (BID) | ORAL | Status: DC
  Administered 2021-04-19 – 2021-04-24 (×10): 5 mg via ORAL
  Filled 2021-04-19 (×10): qty 1

## 2021-04-19 MED ORDER — UMECLIDINIUM BROMIDE 62.5 MCG/INH IN AEPB
1.0000 | INHALATION_SPRAY | Freq: Every evening | RESPIRATORY_TRACT | Status: DC
  Administered 2021-04-19 – 2021-04-26 (×4): 1 via RESPIRATORY_TRACT
  Filled 2021-04-19 (×3): qty 7

## 2021-04-19 MED ORDER — ALBUTEROL SULFATE (2.5 MG/3ML) 0.083% IN NEBU
2.5000 mg | INHALATION_SOLUTION | RESPIRATORY_TRACT | Status: DC | PRN
  Administered 2021-04-20: 2.5 mg via RESPIRATORY_TRACT
  Filled 2021-04-19: qty 3

## 2021-04-19 MED ORDER — IPRATROPIUM BROMIDE 0.06 % NA SOLN: 1.0000 | Freq: Every day | NASAL | Status: DC | PRN

## 2021-04-19 MED ORDER — ALLOPURINOL 100 MG PO TABS
100.0000 mg | ORAL_TABLET | Freq: Every morning | ORAL | Status: DC
  Administered 2021-04-19 – 2021-04-30 (×12): 100 mg via ORAL
  Filled 2021-04-19 (×12): qty 1

## 2021-04-19 MED ORDER — SODIUM CHLORIDE 0.9 % IV SOLN
250.0000 mL | INTRAVENOUS | Status: DC | PRN
  Administered 2021-04-28 – 2021-04-29 (×2): 250 mL via INTRAVENOUS

## 2021-04-19 MED ORDER — FINASTERIDE 5 MG PO TABS
5.0000 mg | ORAL_TABLET | Freq: Every day | ORAL | Status: DC
  Administered 2021-04-19 – 2021-04-29 (×10): 5 mg via ORAL
  Filled 2021-04-19 (×12): qty 1

## 2021-04-19 MED ORDER — INSULIN ASPART 100 UNIT/ML IV SOLN
5.0000 [IU] | Freq: Once | INTRAVENOUS | Status: AC
  Administered 2021-04-19: 5 [IU] via INTRAVENOUS

## 2021-04-19 MED ORDER — RIOCIGUAT 1.5 MG PO TABS: 1.5000 mg | ORAL_TABLET | Freq: Two times a day (BID) | ORAL | Status: DC

## 2021-04-19 MED ORDER — FUROSEMIDE 10 MG/ML IJ SOLN
80.0000 mg | Freq: Once | INTRAMUSCULAR | Status: DC
Start: 2021-04-19 — End: 2021-04-19

## 2021-04-19 MED ORDER — MIDODRINE HCL 5 MG PO TABS
10.0000 mg | ORAL_TABLET | Freq: Three times a day (TID) | ORAL | Status: DC
  Administered 2021-04-19: 10 mg via ORAL
  Filled 2021-04-19 (×2): qty 2

## 2021-04-19 MED ORDER — DEXTROSE 50 % IV SOLN
1.0000 | Freq: Once | INTRAVENOUS | Status: AC
  Administered 2021-04-19: 50 mL via INTRAVENOUS
  Filled 2021-04-19: qty 50

## 2021-04-19 MED ORDER — ONDANSETRON HCL 4 MG/2ML IJ SOLN
4.0000 mg | Freq: Four times a day (QID) | INTRAMUSCULAR | Status: DC | PRN
  Administered 2021-04-19 – 2021-04-27 (×9): 4 mg via INTRAVENOUS
  Filled 2021-04-19 (×10): qty 2

## 2021-04-19 MED ORDER — SODIUM CHLORIDE 0.9% FLUSH: 3.0000 mL | INTRAVENOUS | Status: DC | PRN

## 2021-04-19 MED ORDER — ONDANSETRON HCL 4 MG/2ML IJ SOLN
4.0000 mg | Freq: Once | INTRAMUSCULAR | Status: AC
  Administered 2021-04-19: 4 mg via INTRAVENOUS
  Filled 2021-04-19: qty 2

## 2021-04-19 MED ORDER — ONDANSETRON HCL 4 MG PO TABS
4.0000 mg | ORAL_TABLET | Freq: Four times a day (QID) | ORAL | Status: DC | PRN
  Administered 2021-04-23: 4 mg via ORAL
  Filled 2021-04-19: qty 1

## 2021-04-19 NOTE — Consult Note (Addendum)
Advanced Heart Failure Team Consult Note   Primary Physician: [name], DO PCP-Cardiologist:  Sherren Mocha, MD  Reason for Consultation: HFPEF/chronic RV failure  HPI:    Joshua [name] is seen today for evaluation of HFPEF and RV failure at the request of Dr. Octaviano Glow with Emergency Medicine.   85 y.o. with history of HFPEF, prior PE, and pulmonary hypertension and right sided heart failure.  Patient had a PE in 2016.  He is on apixaban. He has a long history of diastolic CHF.  Most recent echo 2/22 showed a significant component of RV failure with EF 55-60%, IV septum flattened, severe RV enlargement, severely decreased RV function, PASP 57 mmHg.  CTA chest in 4/22 showed no acute PE and mild emphysema, but V/Q scan in 5/22 was suggestive of chronic PE in the right middle lobe.  RHC in 4/22 showed normal filling pressures with moderate PAH. He additionally has chronic hiccups followed by Dr. Benson Norway, now on gabapentin. PFTs in 6/22 showed mixed picture with severe obstruction, moderate restriction, and severely decreased DLCO.  He is now on Adempas for his [place].    He also has had several syncopal spells. Zio monitor was placed to quantify any arrhythmia that could be responsible for his syncope.  Zio 14 day showed mostly SR, BBB/IVCD present, no major arrhythmias. Syncope may be 2/2 orthostatic hypotension. He has required midodrine, recently restarted at office visit 08/24.    Patient recently admitted 08/25 with acute on chronic right-sided heart failure after failing outpatient treatment.  He diuresed 20 lb during the admission. At d/c on 08/30, home torsemide increased from 60 mg BID to 80 am/60 pm. Farxiga added to regimen. Had hypotension during admit (SBP 70s-80s) and midodrine increased to 10 TID.   Patient reports the evening of discharge, began having episodes of nausea and vomiting. Denies dizziness. No dyspnea. Denies fevers or chills. Wife reports he's had poor po  intake the last few days. Has been sleeping most of the day. He appeared confused this am and she had difficulty getting him out of bed. EMS was called. Had issues with urinary retention, which is not new. He self-caths at home and had f/u scheduled with Urology today.  On presentation, initial BP okay. Subsequent BP 60s-80s/40s-50. Has received 10 mg midodrine. Labs significant for Scr 3.69 (1.47 on 08/30), K 5.5, AST 416 and ALT 197 (WNL on 08/25), WBC 12.1, BNP 2,618 (1,600 08/25), neg covid-19/flu, HS trop 247>499. UA pending.  No vomiting since arrival to ED. Denies abdominal pain.   Review of Systems: [y] = yes, _0  = no   General: Weight gain _1 ; Weight loss [Y]; Anorexia [Y]; Fatigue [Y]; Fever _2 ; Chills _3 ; Weakness [Y]  Cardiac: Chest pain/pressure _4 ; Resting SOB _5 ; Exertional SOB _6 ; Orthopnea _7 ; Pedal Edema _8 ; Palpitations _9 ; Syncope _10 ; Presyncope _11 ; Paroxysmal nocturnal dyspnea_12   Pulmonary: Cough _13 ; Wheezing_14 ; Hemoptysis_15 ; Sputum _16 ; Snoring _17   GI: Vomiting_18 ; Dysphagia_19 ; Melena_20 ; Hematochezia _21 ; Heartburn_22 ; Abdominal pain _23 ; Constipation _24 ; Diarrhea _25 ; BRBPR _26   GU: Hematuria_27 ; Dysuria _28 ; Nocturia_29   Vascular: Pain in legs with walking _30 ; Pain in feet with lying flat _31 ; Non-healing sores _32 ; Stroke _33 ; TIA _34 ; Slurred speech _35 ;  Neuro: Headaches_36 ; Vertigo_37 ; Seizures_38 ; Paresthesias_39 ;Blurred vision _40 ;  Diplopia _0 ; Vision changes _1   Ortho/Skin: Arthritis _2 ; Joint pain _3 ; Muscle pain _4 ; Joint swelling _5 ; Back Pain _6 ; Rash _7   Psych: Depression_8 ; Anxiety_9   Heme: Bleeding problems _10 ; Clotting disorders _11 ; Anemia _12   Endocrine: Diabetes _13 ; Thyroid dysfunction_14   Home Medications Prior to Admission medications   Medication Sig Start Date End Date Taking? Authorizing Provider  allopurinol (ZYLOPRIM) 100 MG tablet Take 100 mg by mouth every morning.    [provider]  apixaban  (ELIQUIS) 5 MG TABS tablet Take 1 tablet (5 mg total) by mouth 2 (two) times daily. 02/28/21   Larey Dresser, MD  Azelastine HCl 0.15 % SOLN Place 2 sprays into the nose daily as needed (allergies). 02/22/21   [provider]  Cinnamon 500 MG capsule Take 500 mg by mouth every morning.    [provider]  dapagliflozin propanediol (FARXIGA) 10 MG TABS tablet Take 1 tablet (10 mg total) by mouth daily. 04/18/21   Elodia Florence., MD  doxazosin (CARDURA) 4 MG tablet Take 4 mg by mouth daily.    [provider]  finasteride (PROSCAR) 5 MG tablet Take 5 mg by mouth at bedtime.    [provider]  gabapentin (NEURONTIN) 100 MG capsule Take 200 mg by mouth 3 (three) times daily. 11/27/20   [provider]  ipratropium (ATROVENT) 0.03 % nasal spray Place 1 spray into both nostrils daily as needed (allergies). 03/29/21   [provider]  magnesium oxide (MAG-OX) 400 (240 Mg) MG tablet Take 800 mg by mouth daily. 02/10/21   [provider]  midodrine (PROAMATINE) 5 MG tablet Take 2 tablets (10 mg total) by mouth 3 (three) times daily with meals. 04/17/21 05/17/21  Elodia Florence., MD  pantoprazole (PROTONIX) 40 MG tablet Take 40 mg by mouth every morning.    [provider]  potassium chloride (KLOR-CON) 10 MEQ tablet Take 2 tablets (20 mEq total) by mouth 2 (two) times daily. 02/28/21   Larey Dresser, MD  Riociguat (ADEMPAS) 1.5 MG TABS Take 1.5 mg by mouth in the morning, at noon, and at bedtime. 04/17/21   Elodia Florence., MD  Tiotropium Bromide Monohydrate (SPIRIVA RESPIMAT) 1.25 MCG/ACT AERS Inhale 2 puffs into the lungs every evening.    [provider]  torsemide (DEMADEX) 20 MG tablet Take 4 tablets (80 mg total) by mouth every morning AND 3 tablets (60 mg total) every evening. Take one extra tablet ( 20 mg ) for extra 3 lbs in one day.. 04/18/21 05/18/21  Elodia Florence., MD    Past Medical  History: Past Medical History:  Diagnosis Date   Arthritis    BPH (benign prostatic hyperplasia)    COPD (chronic obstructive pulmonary disease) ([place])    Folliculitis    posterior scalp per office visit note of Dr Maudie Mercury 07/20/2014     GERD (gastroesophageal reflux disease)    Hypertension    PE (pulmonary thromboembolism) ([place])    Phlebitis    right arm  at least 20 years ago    Pneumonia    hx of pneumonia as a child    Sleep apnea     Past Surgical History: Past Surgical History:  Procedure Laterality Date   BIOPSY  11/03/2020   Procedure: BIOPSY;  Surgeon: Carol Ada, MD;  Location: [facility];  Service: Endoscopy;;   bone removed from  little toe right foot      CHOLECYSTECTOMY     ESOPHAGOGASTRODUODENOSCOPY (EGD) WITH PROPOFOL N/A 11/03/2020   Procedure: ESOPHAGOGASTRODUODENOSCOPY (EGD) WITH PROPOFOL;  Surgeon: Carol Ada, MD;  Location: [facility];  Service: Endoscopy;  Laterality: N/A;   pilonidal cyst removal      RIGHT HEART CATH N/A 12/13/2020   Procedure: RIGHT HEART CATH;  Surgeon: Larey Dresser, MD;  Location: [facility];  Service: Cardiovascular;  Laterality: N/A;   TOTAL KNEE ARTHROPLASTY Left 09/13/2014   Procedure: LEFT TOTAL KNEE ARTHROPLASTY;  Surgeon: Mauri Pole, MD;  Location: [facility];  Service: Orthopedics;  Laterality: Left;    Family History: Family History  Problem Relation Age of Onset   Heart attack Brother 63    Social History: Social History   Socioeconomic History   Marital status: Married    Spouse name: Not on file   Number of children: Not on file   Years of education: Not on file   Highest education level: Not on file  Occupational History   Not on file  Tobacco Use   Smoking status: Former    Packs/day: 1.00    Years: 50.00    Pack years: 50.00    Types: Cigarettes    Start date: 70    Quit date: 08/19/2006    Years since quitting: 14.6   Smokeless tobacco: Never  Vaping Use   Vaping Use: Never used   Substance and Sexual Activity   Alcohol use: Not Currently    Alcohol/week: 6.0 standard drinks    Types: 6 Shots of liquor per week   Drug use: No   Sexual activity: Not on file  Other Topics Concern   Not on file  Social History Narrative   Not on file   Social Determinants of Health   Financial Resource Strain: Not on file  Food Insecurity: Not on file  Transportation Needs: Not on file  Physical Activity: Not on file  Stress: Not on file  Social Connections: Not on file    Allergies:  Allergies  Allergen Reactions   Other Swelling    Beer- Swelling    Sunflower Oil Swelling   Sulfa Antibiotics Rash    Objective:    Vital Signs:   Temp:  [98.1 F (36.7 C)-99.1 F (37.3 C)] 98.1 F (36.7 C) (09/01 1144) Pulse Rate:  [67-91] 67 (09/01 1144) Resp:  [18-20] 20 (09/01 1144) BP: (61-127)/(41-97) 85/52 (09/01 1144) SpO2:  [89 %-97 %] 97 % (09/01 1144) Weight:  [98.4 kg] 98.4 kg (09/01 0928)    Weight change: Filed Weights   04/19/21 0928  Weight: 98.4 kg    Intake/Output:  No intake or output data in the 24 hours ending 04/19/21 1157    Physical Exam    General:  Chronically ill appearing elderly male. No resp difficulty HEENT: normal Neck: supple. JVD 10-12 cm. Carotids 2+ bilat; no bruits. No lymphadenopathy or thyromegaly appreciated. Cor: PMI nondisplaced. Regular rate & rhythm. No rubs, gallops or murmurs. Lungs: CTA Abdomen: soft, nontender, nondistended. No hepatosplenomegaly. No bruits or masses. Good bowel sounds. Extremities: no cyanosis, clubbing, rash, edema Neuro: alert & orientedx3, cranial nerves grossly intact. moves all 4 extremities w/o difficulty. Affect pleasant   Telemetry   Sinus 70s-80s, occasional PVCs  EKG    Sinus 81, RBBB  Labs   Basic Metabolic Panel: Recent Labs  Lab 04/14/21 0325 04/15/21 0520 04/16/21 0131 04/17/21 0210 04/19/21 0915  NA 138 137 136 136  135  K 3.8 3.3* 3.7 3.6 5.5*  CL 99 94* 93* 90*  89*  CO2 30 36* 36* 38* 29  GLUCOSE 80 90 95 94 117*  BUN 26* _0 41*  CREATININE 1.48* 1.41* 1.39* 1.47* 3.69*  CALCIUM 9.0 9.1 9.1 9.1 9.8  MG 1.5* 2.1 1.8 1.7  --   PHOS 3.5  --   --   --   --     Liver Function Tests: Recent Labs  Lab 04/14/21 0325 04/19/21 0915  AST 20 416*  ALT 12 197*  ALKPHOS 92 107  BILITOT 1.2 2.3*  PROT 5.6* 6.8  ALBUMIN 2.6* 3.1*   Recent Labs  Lab 04/19/21 0915  LIPASE 24   No results for input(s): AMMONIA in the last 168 hours.  CBC: Recent Labs  Lab 04/12/21 1435 04/14/21 0325 04/16/21 0131 04/17/21 0210 04/19/21 0915  WBC 3.9* 3.5* 4.0 4.5 12.1*  NEUTROABS 2.8 2.0  --  2.8 10.8*  HGB 15.0 14.6 14.8 14.7 14.7  HCT 47.1 44.6 44.4 44.2 45.2  MCV 89.4 86.6 85.4 86.0 89.2  PLT 161 150 153 161 117*    Cardiac Enzymes: No results for input(s): CKTOTAL, CKMB, CKMBINDEX, TROPONINI in the last 168 hours.  BNP: BNP (last 3 results) Recent Labs    04/11/21 1047 04/12/21 1435 04/19/21 0915  BNP 1,737.2* 1,597.1* 2,618.0*    ProBNP (last 3 results) No results for input(s): PROBNP in the last 8760 hours.   CBG: No results for input(s): GLUCAP in the last 168 hours.  Coagulation Studies: No results for input(s): LABPROT, INR in the last 72 hours.   Imaging   DG Chest Portable 1 View  Result Date: 04/19/2021 CLINICAL DATA:  Vomiting.  Evaluate for aspiration EXAM: PORTABLE CHEST 1 VIEW COMPARISON:  04/12/2021 FINDINGS: New chronic cardiomegaly and vascular pedicle widening. Interstitial coarsening at the bases which is similar to prior. No focal infiltrate, effusion, or air leak. IMPRESSION: 1. No focal opacity to confirm aspiration. 2. Increased interstitial coarsening which could be from portable technique or mild edema. Electronically Signed   By: Monte Fantasia M.D.   On: 04/19/2021 10:02     Medications:     Current Medications:  furosemide  80 mg Intravenous Once   midodrine  10 mg Oral TID WC     Infusions:    Assessment/Plan  1. Chronic HFpEF/RV failure: Echo (2/22) with EF 55-60%, IV septum flattened, severe RV enlargement, severely decreased RV function, PASP 57 mmHg.  RHC in 4/22 showed normal filling pressures. Admitted for a/c RV failure w/ markedly volume overload + poor response to home diuretics.  -Recent admit with a/c HF. Diuresed 20 lb. D/C on torsemide 80 mg am/ 60 mg pm. Wilder Glade added.  - Now presenting with N, V, fatigue and AKI on CKD. Not overloaded on exam, has elevated JVP in setting of severe RV failure. Scr up from 1.5 to 3.69. Hypotensive. SBP down to 60s, of note had BP 70s-80s during recent admit.  - Already given 10 mg midodrine in ED. Give 500 cc NS. Hold diuretics.  - Recheck BMET this afternoon.  - Continue midodrine 10 tid with RV failure and soft BP.  - May need to consider adding dobutamine if BP not improving - TED hose 2. AKI on Stage III CKD: Baseline SCr ~1.6. Scr 1.47 at d/c on 08/30. Up to 3.69 today.   - ? Cardiorenal.  - Poor po intake and vomiting last few days - IV  fluids as above.  - Hold diuretics - continue midodrine 10 mg TID for BP support - Monitor closely 3. Hyperkalemia: - K 5.5 - Give 10 g lokelma - D/C home K supplement 4. Pulmonary HTN: PAH noted on 4/22 RHC with PVR 6.1 WU.  This appears to be multifactorial with OSA, emphysema, hypoxemia with ambulation, and a suspected chronic PE involving the right middle lobe (group 3 and group 4 PH). Given the suspected mixed etiology with only 1 area of chronic thromboembolism (right middle lobe) as well as age, do not think that pulmonary thromboendarterectomy would be indicated.  Rheumatologic serologic workup was negative.  PFTs showed severe obstruction and moderate restriction, suggesting significant COPD. - Continue riociguat for CTEPH, 1.5 mg bid.  - 02 sats stable on 3L 02, on chronic 02 at home 5. Elevated troponin: - HS trop 247 > 499 - Denies CP  - Likely d/t demand  ischemia 6. Elevated LFTs - AST 416, ALT 197 (normal 08/25) - Trend - CT abdomen and pelvis pending 7. OSA: untreated. Unable to tolerate CPAP  8. Emphysema: Prior smoker.  Emphysema on CT and severe obstruction on PFTs - He uses home oxygen  9. Orthostatic Hypotension  - c/w midodrine + leg compression + fluids 10. Chronic PE - on Eliquis 11. BPH with urinary retention: He has had issues with this in the past, often self caths 3-4x/day.  - On Flomax.  - Had outpatient f/u scheduled with urology today 12. Leukocytosis: - WBC 12.1 - AF - Straight cath's and had foley during recent admit. UA pending    Length of Stay: 0  FINCH, LINDSAY N, PA-C  04/19/2021, 11:57 AM  Advanced Heart Failure Team Pager [phone] (M-F; 7a - 5p)  Please contact [facility] for night-coverage after hours (4p -7a ) and weekends on [email or web address]   Patient seen and examined with the above-signed Advanced Practice Provider and/or Housestaff. I personally reviewed laboratory data, imaging studies and relevant notes. I independently examined the patient and formulated the important aspects of the plan. I have edited the note to reflect any of my changes or salient points. I have personally discussed the plan with the patient and/or family.  85 y/o male with severe RV failure CKD3a recently admitted with massive volume overload and diuresed 25 pounds. On d/c was on torsemide 80/60 and Farxiga added.   Presents with weakness/lethargy. SBP 80s. SCr 1.6 -> 3.7  K 5.5 given 500 cc NS and lokelma.  Mentating well. Not acidotic.  General:  Elderly. Weak appearing. No resp difficulty HEENT: normal Neck: supple. no JVD. Carotids 2+ bilat; no bruits. No lymphadenopathy or thryomegaly appreciated. Cor: PMI nondisplaced. Regular rate & rhythm. No rubs, gallops or murmurs. Lungs: clear Abdomen: obese soft, nontender, nondistended. No hepatosplenomegaly. No bruits or masses. Good bowel sounds. Extremities: no cyanosis,  clubbing, rash, edema Neuro: alert & orientedx3, cranial nerves grossly intact. moves all 4 extremities w/o difficulty. Affect pleasant  He has severe RV failure at baseline. SBPs run in 80s on midodrine. Suspect he is dry. Mentating well. Agree with hydration. Hold diuretics. Will admit to follow renal function and potassium. No need for ICU care at this point.   Glori Bickers, MD  3:59 PM

## 2021-04-19 NOTE — Progress Notes (Signed)
Saw with Dr Aundra Dubin.   BP intermittently low.   SBP 81/57. Asymptomatic.   Increase midodrine to 15 mg tid.   No change other change for now.   May need foley catheter.  He self caths at home.   Analissa Bayless NP-C  2:58 PM

## 2021-04-19 NOTE — H&P (Addendum)
History and Physical    Joshua [name] DOA: 04/19/2021  PCP: [name], DO Consultants:  cardiology: Dr. Burt Knack and Dr. Aundra Dubin pulmonology: [name], GI: Dr. Benson Norway, urologist: unsure of who he sees.  Patient coming from:  Home - lives with wife   Chief Complaint: nausea and vomiting x 1.5 days  HPI: Joshua [name] is a 85 y.o. male with medical history significant of  chronic diastolic CHF, chronic right-sided CHF, hypotension on midodrine, COPD on 3L oxygen prn, BPH, CKD stage IIIa, PE on Eliquis, presented with nausea and vomiting that started on Tuesday night. He states he had at least 20 episodes of emesis on Wednesday. No blood, but had a brown color to it at times. He had no associated abdominal pain. He thought his midodrine made him sick so he did not take it this AM. He also takes torsemide 11m in the AM and 650mat night and has been taking this with episodes of vomiting. He did drink water yesterday and was able to keep it down only at times. He has not made any urine, but needs to self cath about 3 times a day. He has not self cathed since last night and has not had any urine output. Denies any color change in his urine.   Recently discharged on 8/30 for CHF exacerbation. Was started on midodrine per cardiology recs to support hypotension in setting of diuresis. Also started on farxiga, but he has not started this.   He denies any fever, chills, shortness of breath or coughing, chest pain or palpitations, no diarrhea or constipation. Denies worsening orthopnea or weight gain. Has nto had increased leg swelling.  NO dysuria or CVA tenderness. No headaches or lightheadedness or dizziness.     ED Course: vitals: Blood pressures low [id number] 9046Kystolic, afebrile, heart rate 67, respiratory rate 20, oxygen 97% on 3 L nasal cannula.  Pertinent labs: WBC 12.1, platelets 117, calcium 5.5, chloride 89, BUN 41, creatinine 3.69, AST 416, ALT 197, troponin  247--->499, BNP 2618, COVID flu negative Chest x-ray with no focal opacity, increased interstitial coarsening which could be from portable technique or mild edema CT abdomen pelvis shows abrupt narrowing of the third portion of the duodenum as it passes between the aneurysmal abdominal aorta and SMV with upstream dilation is suggestive of nutcracker phenomenon which may account for the patient's symptoms.  No evidence of high-grade bowel obstruction.  Cardiology consulted and seen in ED we were called and asked to admit   Review of Systems: As per HPI; otherwise review of systems reviewed and negative.   Ambulatory Status:  Ambulates without assistance    Past Medical History:  Diagnosis Date   Arthritis    BPH (benign prostatic hyperplasia)    COPD (chronic obstructive pulmonary disease) (HCC)    Folliculitis    posterior scalp per office visit note of Dr KiMaudie Mercury2/09/2013     GERD (gastroesophageal reflux disease)    Hypertension    PE (pulmonary thromboembolism) ([facility]   Phlebitis    right arm  at least 20 years ago    Pneumonia    hx of pneumonia as a child    Sleep apnea     Past Surgical History:  Procedure Laterality Date   BIOPSY  11/03/2020   Procedure: BIOPSY;  Surgeon: HuCarol AdaMD;  Location: [facility];  Service: Endoscopy;;   bone removed from little toe right foot      CHOLECYSTECTOMY  ESOPHAGOGASTRODUODENOSCOPY (EGD) WITH PROPOFOL N/A 11/03/2020   Procedure: ESOPHAGOGASTRODUODENOSCOPY (EGD) WITH PROPOFOL;  Surgeon: Carol Ada, MD;  Location: [facility];  Service: Endoscopy;  Laterality: N/A;   pilonidal cyst removal      RIGHT HEART CATH N/A 12/13/2020   Procedure: RIGHT HEART CATH;  Surgeon: Larey Dresser, MD;  Location: [facility];  Service: Cardiovascular;  Laterality: N/A;   TOTAL KNEE ARTHROPLASTY Left 09/13/2014   Procedure: LEFT TOTAL KNEE ARTHROPLASTY;  Surgeon: Mauri Pole, MD;  Location: [facility];  Service: Orthopedics;   Laterality: Left;    Social History   Socioeconomic History   Marital status: Married    Spouse name: Not on file   Number of children: Not on file   Years of education: Not on file   Highest education level: Not on file  Occupational History   Not on file  Tobacco Use   Smoking status: Former    Packs/day: 1.00    Years: 50.00    Pack years: 50.00    Types: Cigarettes    Start date: 44    Quit date: 08/19/2006    Years since quitting: 14.6   Smokeless tobacco: Never  Vaping Use   Vaping Use: Never used  Substance and Sexual Activity   Alcohol use: Not Currently    Alcohol/week: 6.0 standard drinks    Types: 6 Shots of liquor per week   Drug use: No   Sexual activity: Not on file  Other Topics Concern   Not on file  Social History Narrative   Not on file   Social Determinants of Health   Financial Resource Strain: Not on file  Food Insecurity: Not on file  Transportation Needs: Not on file  Physical Activity: Not on file  Stress: Not on file  Social Connections: Not on file  Intimate Partner Violence: Not on file    Allergies  Allergen Reactions   Other Swelling    Beer- Swelling    Sunflower Oil Swelling   Sulfa Antibiotics Rash    Family History  Problem Relation Age of Onset   Heart attack Brother 57    Prior to Admission medications   Medication Sig Start Date End Date Taking? Authorizing Provider  albuterol (VENTOLIN HFA) 108 (90 Base) MCG/ACT inhaler Inhale 1 puff into the lungs every 4 (four) hours as needed for shortness of breath.   Yes [provider]  allopurinol (ZYLOPRIM) 100 MG tablet Take 100 mg by mouth every morning.   Yes [provider]  apixaban (ELIQUIS) 5 MG TABS tablet Take 1 tablet (5 mg total) by mouth 2 (two) times daily. 02/28/21  Yes Larey Dresser, MD  Azelastine HCl 0.15 % SOLN Place 2 sprays into the nose daily as needed (allergies). 02/22/21  Yes [provider]  Cinnamon 500 MG capsule Take  500 mg by mouth every morning.   Yes [provider]  dapagliflozin propanediol (FARXIGA) 10 MG TABS tablet Take 1 tablet (10 mg total) by mouth daily. 04/18/21  Yes Elodia Florence., MD  doxazosin (CARDURA) 4 MG tablet Take 4 mg by mouth daily.   Yes [provider]  finasteride (PROSCAR) 5 MG tablet Take 5 mg by mouth at bedtime.   Yes [provider]  gabapentin (NEURONTIN) 100 MG capsule Take 200 mg by mouth 3 (three) times daily. 11/27/20  Yes [provider]  ipratropium (ATROVENT) 0.03 % nasal spray Place 1 spray into both nostrils daily as needed (  allergies). 03/29/21  Yes [provider]  magnesium oxide (MAG-OX) 400 (240 Mg) MG tablet Take 800 mg by mouth daily. 02/10/21  Yes [provider]  midodrine (PROAMATINE) 5 MG tablet Take 2 tablets (10 mg total) by mouth 3 (three) times daily with meals. 04/17/21 05/17/21 Yes Elodia Florence., MD  OXYGEN Inhale 2 L into the lungs as needed for shortness of breath.   Yes [provider]  pantoprazole (PROTONIX) 40 MG tablet Take 40 mg by mouth every morning.   Yes [provider]  potassium chloride (KLOR-CON) 10 MEQ tablet Take 2 tablets (20 mEq total) by mouth 2 (two) times daily. 02/28/21  Yes Larey Dresser, MD  Riociguat (ADEMPAS) 1.5 MG TABS Take 1.5 mg by mouth in the morning, at noon, and at bedtime. 04/17/21  Yes Elodia Florence., MD  Tiotropium Bromide Monohydrate (SPIRIVA RESPIMAT) 1.25 MCG/ACT AERS Inhale 2 puffs into the lungs every evening.   Yes [provider]  torsemide (DEMADEX) 20 MG tablet Take 4 tablets (80 mg total) by mouth every morning AND 3 tablets (60 mg total) every evening. Take one extra tablet ( 20 mg ) for extra 3 lbs in one day.. 04/18/21 05/18/21  Elodia Florence., MD    Physical Exam: Vitals:   04/19/21 1510 04/19/21 1515 04/19/21 1530 04/19/21 1545  BP: (!) 82/51 (!) 88/53 (!) 77/51 93/61  Pulse: 67 66 65 64   Resp: _0 Temp:      TempSrc:      SpO2: 94% 92% 98% 100%  Weight:      Height:         General:  Appears calm and comfortable and is in NAD Eyes:  PERRL, EOMI, normal lids, iris ENT:  grossly normal hearing, lips & tongue, mmm; appropriate dentition Neck:  no LAD, masses or thyromegaly; no carotid bruits Cardiovascular:  RRR, no m/r/g. Trace bilateral LE edema.  Respiratory:   CTA bilaterally with no wheezes/rales/rhonchi.  Normal respiratory effort. Abdomen:  soft, NT, ND, NABS. No fluid wave  Back:   normal alignment, no CVAT Skin:  no rash or induration seen on limited exam Musculoskeletal:  grossly normal tone BUE/BLE, good ROM, no bony abnormality Lower extremity:  Limited foot exam with no ulcerations.  2+ distal pulses. Psychiatric:  grossly normal mood and affect, speech fluent and appropriate, AOx3 Neurologic:  CN 2-12 grossly intact, moves all extremities in coordinated fashion, sensation intact    Radiological Exams on Admission: Independently reviewed - see discussion in A/P where applicable  CT ABDOMEN PELVIS WO CONTRAST  Result Date: 04/19/2021 CLINICAL DATA:  Concern for bowel obstruction, vomiting for 2 days EXAM: CT ABDOMEN AND PELVIS WITHOUT CONTRAST TECHNIQUE: Multidetector CT imaging of the abdomen and pelvis was performed following the standard protocol without IV contrast. COMPARISON:  None. FINDINGS: Lower chest: There is a trace right pleural effusion with bibasilar subsegmental atelectasis. The heart is enlarged. Coronary artery calcifications are noted. There is trace pericardial fluid. Hepatobiliary: The liver is within normal limits, within the confines of noncontrast technique. Pancreas: The pancreas is atrophic but otherwise unremarkable. There is no focal lesion or contour abnormality. There is no main pancreatic ductal dilatation or peripancreatic inflammatory change. Spleen: Unremarkable. Adrenals/Urinary Tract: The adrenals are unremarkable.  There is a 2.2 cm left lower pole renal cyst. There are no other focal lesions. There are no stones. There is no hydronephrosis or hydroureter. The bladder wall is thickened,  likely reflecting chronic outlet obstruction. Stomach/Bowel: There is a moderate hiatal hernia. The stomach is otherwise unremarkable. There is dilation of the third portion of the duodenum with marked narrowing as the duodenum passes between the aneurysmal aorta and the SMV. Otherwise, there is no evidence of bowel obstruction. There is no evidence of bowel wall thickening or inflammatory change. The appendix is normal. There are scattered colonic diverticula without evidence of acute diverticulitis. Vascular/Lymphatic: There is extensive calcified atherosclerotic plaque throughout the arterial vasculature. The infrarenal abdominal aorta is aneurysmal measuring up to 3.5 cm. There is no evidence of rupture. There is no abdominal or pelvic lymphadenopathy. Reproductive: The prostate is enlarged measuring up to 6.4 cm and impresses upon the inferior aspect of the bladder. Other: There is scattered fluid in the abdomen measuring greater than simple fluid attenuation, predominantly around the liver tip and spleen. There is no free intraperitoneal air. There is a small umbilical hernia containing a loop of unobstructed small bowel. Musculoskeletal: There is mild multilevel degenerative change of the spine. There is no acute osseous abnormality or aggressive osseous lesion. IMPRESSION: 1. Abrupt narrowing of the third portion of the duodenum as it passes between the aneurysmal abdominal aorta and SMV with upstream dilation is suggestive of nutcracker phenomenon which may account for the patient's symptoms. No evidence of high-grade bowel obstruction. 2. Scattered ascites throughout the abdomen measuring greater than simple fluid attenuation. This may reflect proteinaceous debris; however, hemoperitoneum is not excluded. No source of bleeding is  identified. 3. Infrarenal abdominal aortic aneurysm measuring up to 3.5 cm without evidence of rupture. Recommend follow-up ultrasound every 2 years. This recommendation follows ACR consensus guidelines: White Paper of the ACR Incidental Findings Committee II on Vascular Findings. J Am Coll Radiol 2013; 10:789-794. 4. Small ventral abdominal hernia containing a loop of nonobstructed small bowel. 5. Moderate-sized hiatal hernia. 6. Thickened bladder wall likely reflecting chronic outlet obstruction from the enlarged prostate. 7. Cardiomegaly. Electronically Signed   By: Valetta Mole M.D.   On: 04/19/2021 14:24   DG Chest Portable 1 View  Result Date: 04/19/2021 CLINICAL DATA:  Vomiting.  Evaluate for aspiration EXAM: PORTABLE CHEST 1 VIEW COMPARISON:  04/12/2021 FINDINGS: New chronic cardiomegaly and vascular pedicle widening. Interstitial coarsening at the bases which is similar to prior. No focal infiltrate, effusion, or air leak. IMPRESSION: 1. No focal opacity to confirm aspiration. 2. Increased interstitial coarsening which could be from portable technique or mild edema. Electronically Signed   By: Monte Fantasia M.D.   On: 04/19/2021 10:02    EKG: Independently reviewed.  NSR with rate 66, RBBB; nonspecific ST changes with no evidence of acute ischemia. Similar to previous tracings.    Labs on Admission: I have personally reviewed the available labs and imaging studies at the time of the admission.  Pertinent labs:  WBC 12.1,  platelets 117,  potassium 5.5,  chloride 89,  BUN 41,  creatinine 3.69,  AST 416,  ALT 197,  troponin 247--->499,  BNP 2618,  Assessment/Plan Principal Problem:   Intractable nausea and vomiting -? If viral GI bug vs. Possible nutcracker phenomenon in duodenum.  -zofran prn and has had no more episodes since being in ER  Active Problems: Acute renal failure superimposed on stage 3 chronic kidney disease (HCC) -baseline creatinine: 1.3-1.6. at d/c on 6/30 was  1.47. today it's 3.69 -Questionable if prerenal in setting of intractable nausea and vomiting with recent aggressive diuresis.  Has had no more episodes of vomiting since being  in the emergency room.  given 500 cc bolus in ER -Concern for cardiorenal syndrome -Hold diuretics -Strict I's and O's -Known urinary obstruction due to enlarged prostate and has not urinated since last night.  Have asked the nurses to let him do self caths and if this does not work we will need to place Foley. May have post obstructive component.  -Renal ultrasound with no hydronephrosis but does show prostate a megaly with diffuse urinary bladder wall thickening -We will check urine studies and nephrology may need to be consulted if continues to trend upward   Hypotension -Blood pressure fluctuating from 18H to 63J systolic during last hospital stay.  He is asymptomatic Discharged on 10 mg 3 times daily of midodrine -midodrine in creased to 15 mg per cardiology -slightly orthostatic (drop 15 points in systolic). TED hose.     Hyperkalemia -Given NovoLog, dextrose, Lokelma -potassium returned to normal at 4.8 -Holding home potassium and magnesium supplement -Trend daily    Transaminitis -New finding as these were normal on 8/25 -No history of cirrhosis or portal hypertension -No abnormal findings on CT however ultrasound would be better imaging for the liver -?  Congestion from acute on chronic renal failure and severe right ventricular failure in setting of possible cardiorenal syndrome -? If he has a viral GI bug with all of his vomiting that may have caused the transaminitis -He does have scattered ascites throughout the abdomen on CT. no fluid wave, distention or tenderness on exam. Will check [place].  -Continue to trend -Hep panel pending -Avoid Tylenol and statins    Elevated troponin -Trending upward to 247> 499 -Likely due to demand ischemia denies any chest pain and no EKG changes -cardiology  following continue to trend troponin -Telemetry    Duodenal anomaly -Abrupt narrowing of the third portion of the duodenum as it passes between the aneurysmal abdominal aorta are and SMV with upstream dilation suggestive of nutcracker phenomenon -No acute abdomen on exam and he has no complaints of abdominal pain -Consulted general surgery.  will make him n.p.o. and they will see in the morning -Questionable if contributing to all of his nausea and vomiting however he has no abdominal pain -if continues to vomit: NG tube   Pulmonary HTN -PAH noted [facility] on 11/2020.  -Per cardiology continue riociguat 1.53m BID, was on this TID.   Chronic right-sided heart failure (HRidgely Echo in 09/2020 showed EF of 55-60%.  Fairly reduced right ventricular systolic function.  RV size is severely enlarged.  Moderately elevated pulmonary artery systolic pressure. -Elevated BNP but appears dry on exam today -Recent hospital admission with discharge on 8/30 for acute CHF exacerbation.  Diuresed 20 pounds.  Discharged on furosemide 80 mg a.m. and 60 mg p.m. FWilder Gladewas added but he did not start this. Holding in setting of possible volume depletion with intractable N/V and acute on chronic renal failure  -Cardiology following -holding diuretics in setting of acute on chronic renal failure -Strict intake and output and daily weights     BPH with urinary obstruction -History of Foley placement when he was recently in hospital. has had problems urinating since discharge 2 days ago. Is having to self cath.  Was supposed to see urology today but  wound up coming to the ER. -Has not self cath since yesterday -Orders placed to self cath and may need Foley placement again -continue cardura and proscar    History of pulmonary embolism -Continue Eliquis    Chronic respiratory failure with  hypoxia (HCC) Stable at his baseline of 3 L via nasal cannula    COPD (chronic obstructive pulmonary disease) ([facility])  -Continue  home inhalers -PFTS with severe obstruction and moderate restriction 6/22 -No evidence of any exacerbation at this time  Osa Can not tolerate CPAP.   Chronic hiccups -decreased gabpentin down to 144m TID   Infrarenal abdominal aortic aneurysm -Measuring up to 3.5 cm recommend repeat ultrasound every 2 years  Body mass index is 27.12 kg/m.   Level of care: Progressive DVT prophylaxis: eliquis  Code Status:  Full - confirmed with patient Family Communication: None present Disposition Plan:  The patient is from: home  Dispo per day team Requires inpatient hospitalization and is at significant risk of multi system worsening, requires constant monitoring, assessment and MDM with specialists.  Patient is currently: acutely ill Consults called: cardiology/general surgery  Admission status:  inpatient    AOrma FlamingMD [facility]   How to contact the [facility] CenterAttending or Consulting provider [place] covering provider during after hours [place] for this patient?  Check the care team in [facility] look for a) attending/consulting [facility] provider listed and b) the [facility] Hospitalteam listed Log into [email or web address] and use [facility]'s universal password to access. If you do not have the password, please contact the hospital operator. Locate the [facility] Hospitalprovider you are looking for under [facility] and page to a number that you can be directly reached. If you still have difficulty reaching the provider, please page the [facility](Director on Call) for the Hospitalists listed on amion for assistance.   04/19/2021, 4:38 PM

## 2021-04-19 NOTE — ED Notes (Signed)
Attempted to call report

## 2021-04-19 NOTE — ED Provider Notes (Signed)
[facility] EMERGENCY DEPARTMENT Provider Note   CSN: [id number] Arrival date & time: 04/19/21  9373     History CC: Nausea and vomiting   Joshua [name] is a 85 y.o. male with history congestive heart failure, COPD, reflux, Pulm HTN, CKD, PE on Eliquis, presenting from home with concern for nausea and vomiting.  Patient was just discharged in the hospital 2 days ago on the 30th after admission for CHF exacerbation, noted at that time to be hypotensive, and started on midodrine per cardiology recommendations to support his low blood pressure in the setting of needing aggressive diuresis.  His systolic pressures ranged from the 70s to the 90s, but he was noted to be asymptomatic with this.  The patient reports that he believes this medication is making him sick.  He says he did not take the midodrine this morning but did take his other medications.  He reports that he has had nausea and several episodes of dry heaving, belching since last night.  He was also started on Farxiga in the hospital for his diabetes and CKD.  He was prescribed 80 mg of torsemide in the morning and 60 mg at night.  He is also prescribed midodrine 10 mg 3 times a day with meals.  He is also on Eliquis for history of pulmonary embolism.  Last echo Feb 2022 with EF 55-60%, normal LV function, RV function severely reduced and RV enlarged, pulm HTN, RA dilation  HPI     Past Medical History:  Diagnosis Date   Arthritis    BPH (benign prostatic hyperplasia)    COPD (chronic obstructive pulmonary disease) ([place])    Folliculitis    posterior scalp per office visit note of Dr Maudie Mercury 07/20/2014     GERD (gastroesophageal reflux disease)    Hypertension    PE (pulmonary thromboembolism) ([place])    Phlebitis    right arm  at least 20 years ago    Pneumonia    hx of pneumonia as a child    Sleep apnea     Patient Active Problem List   Diagnosis Date Noted   Acute renal failure superimposed on stage  3 chronic kidney disease ([place]) 04/19/2021   Intractable nausea and vomiting 04/19/2021   Hyperkalemia 04/19/2021   Transaminitis 04/19/2021   Elevated troponin 04/19/2021   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   Acute on chronic diastolic (congestive) heart failure ([place]) 04/12/2021   Acute on chronic right-sided heart failure ([place]) 04/12/2021   Acute kidney injury superimposed on CKD ([place]) 04/12/2021   OSA (obstructive sleep apnea) 12/07/2020   Aortic atherosclerosis ([place]) 12/07/2020   CHF (congestive heart failure) ([place]) 12/03/2020   Syncope and collapse 12/03/2020   Chronic right-sided heart failure ([place]) 12/03/2020   Pulmonary HTN ([place]) 09/29/2017   BPH with urinary obstruction 08/25/2017   Syncope, vasovagal 08/25/2017   Demand ischemia of myocardium ([place]) 42/87/6811   Diastolic CHF ([place]) 57/26/2035   Essential hypertension 02/16/2015   COPD (chronic obstructive pulmonary disease) ([place]) 12/12/2014   History of pulmonary embolism 10/10/2014   Chronic respiratory failure with hypoxia ([place]) 10/10/2014   S/P knee replacement 09/13/2014    Past Surgical History:  Procedure Laterality Date   BIOPSY  11/03/2020   Procedure: BIOPSY;  Surgeon: Carol Ada, MD;  Location: [facility];  Service: Endoscopy;;   bone removed from little toe right foot      CHOLECYSTECTOMY     ESOPHAGOGASTRODUODENOSCOPY (EGD) WITH PROPOFOL N/A 11/03/2020   Procedure: ESOPHAGOGASTRODUODENOSCOPY (EGD)  WITH PROPOFOL;  Surgeon: Carol Ada, MD;  Location: [facility];  Service: Endoscopy;  Laterality: N/A;   pilonidal cyst removal      RIGHT HEART CATH N/A 12/13/2020   Procedure: RIGHT HEART CATH;  Surgeon: Larey Dresser, MD;  Location: [place] [facility];  Service: Cardiovascular;  Laterality: N/A;   TOTAL KNEE ARTHROPLASTY Left 09/13/2014   Procedure: LEFT TOTAL KNEE ARTHROPLASTY;  Surgeon: Mauri Pole, MD;  Location: [facility];  Service: Orthopedics;  Laterality: Left;       Family History  Problem  Relation Age of Onset   Heart attack Brother 21    Social History   Tobacco Use   Smoking status: Former    Packs/day: 1.00    Years: 50.00    Pack years: 50.00    Types: Cigarettes    Start date: 52    Quit date: 08/19/2006    Years since quitting: 14.6   Smokeless tobacco: Never  Vaping Use   Vaping Use: Never used  Substance Use Topics   Alcohol use: Not Currently    Alcohol/week: 6.0 standard drinks    Types: 6 Shots of liquor per week   Drug use: No    Home Medications Prior to Admission medications   Medication Sig Start Date End Date Taking? Authorizing Provider  albuterol (VENTOLIN HFA) 108 (90 Base) MCG/ACT inhaler Inhale 1 puff into the lungs every 4 (four) hours as needed for shortness of breath.   Yes [provider]  allopurinol (ZYLOPRIM) 100 MG tablet Take 100 mg by mouth every morning.   Yes [provider]  apixaban (ELIQUIS) 5 MG TABS tablet Take 1 tablet (5 mg total) by mouth 2 (two) times daily. 02/28/21  Yes Larey Dresser, MD  Azelastine HCl 0.15 % SOLN Place 2 sprays into the nose daily as needed (allergies). 02/22/21  Yes [provider]  Cinnamon 500 MG capsule Take 500 mg by mouth every morning.   Yes [provider]  dapagliflozin propanediol (FARXIGA) 10 MG TABS tablet Take 1 tablet (10 mg total) by mouth daily. 04/18/21  Yes Elodia Florence., MD  doxazosin (CARDURA) 4 MG tablet Take 4 mg by mouth daily.   Yes [provider]  finasteride (PROSCAR) 5 MG tablet Take 5 mg by mouth at bedtime.   Yes [provider]  gabapentin (NEURONTIN) 100 MG capsule Take 200 mg by mouth 3 (three) times daily. 11/27/20  Yes [provider]  ipratropium (ATROVENT) 0.03 % nasal spray Place 1 spray into both nostrils daily as needed (allergies). 03/29/21  Yes [provider]  magnesium oxide (MAG-OX) 400 (240 Mg) MG tablet Take 800 mg by mouth daily. 02/10/21  Yes [provider]   midodrine (PROAMATINE) 5 MG tablet Take 2 tablets (10 mg total) by mouth 3 (three) times daily with meals. 04/17/21 05/17/21 Yes Elodia Florence., MD  OXYGEN Inhale 2 L into the lungs as needed for shortness of breath.   Yes [provider]  pantoprazole (PROTONIX) 40 MG tablet Take 40 mg by mouth every morning.   Yes [provider]  potassium chloride (KLOR-CON) 10 MEQ tablet Take 2 tablets (20 mEq total) by mouth 2 (two) times daily. 02/28/21  Yes Larey Dresser, MD  Riociguat (ADEMPAS) 1.5 MG TABS Take 1.5 mg by mouth in the morning, at noon, and at bedtime. 04/17/21  Yes Elodia Florence., MD  Tiotropium Bromide Monohydrate (SPIRIVA RESPIMAT) 1.25 MCG/ACT AERS  Inhale 2 puffs into the lungs every evening.   Yes [provider]  torsemide (DEMADEX) 20 MG tablet Take 4 tablets (80 mg total) by mouth every morning AND 3 tablets (60 mg total) every evening. Take one extra tablet ( 20 mg ) for extra 3 lbs in one day.. 04/18/21 05/18/21  Elodia Florence., MD    Allergies    Other, Sunflower oil, and Sulfa antibiotics  Review of Systems   Review of Systems  Constitutional:  Negative for chills and fever.  HENT:  Negative for ear pain and sore throat.   Eyes:  Negative for pain and visual disturbance.  Respiratory:  Negative for cough and shortness of breath.   Cardiovascular:  Negative for chest pain and palpitations.  Gastrointestinal:  Positive for nausea and vomiting. Negative for abdominal pain.  Genitourinary:  Negative for dysuria and hematuria.  Musculoskeletal:  Negative for arthralgias and back pain.  Skin:  Negative for color change and rash.  Neurological:  Negative for syncope and headaches.  All other systems reviewed and are negative.  Physical Exam Updated Vital Signs BP 93/61   Pulse 64   Temp 98.1 F (36.7 C) (Oral)   Resp 15   Ht 6' 3" (1.905 m)   Wt 98.4 kg   SpO2 100%   BMI 27.12 kg/m   Physical Exam Constitutional:       General: He is not in acute distress. HENT:     Head: Normocephalic and atraumatic.  Eyes:     Conjunctiva/sclera: Conjunctivae normal.     Pupils: Pupils are equal, round, and reactive to light.  Cardiovascular:     Rate and Rhythm: Normal rate and regular rhythm.     Pulses: Normal pulses.  Pulmonary:     Effort: Pulmonary effort is normal. No respiratory distress.  Abdominal:     General: There is no distension.     Tenderness: There is no abdominal tenderness. There is no guarding.  Skin:    General: Skin is warm and dry.  Neurological:     General: No focal deficit present.     Mental Status: He is alert. Mental status is at baseline.    ED Results / Procedures / Treatments   Labs (all labs ordered are listed, but only abnormal results are displayed) Labs Reviewed  COMPREHENSIVE METABOLIC PANEL - Abnormal; Notable for the following components:      Result Value   Potassium 5.5 (*)    Chloride 89 (*)    Glucose, Bld 117 (*)    BUN 41 (*)    Creatinine, Ser 3.69 (*)    Albumin 3.1 (*)    AST 416 (*)    ALT 197 (*)    Total Bilirubin 2.3 (*)    GFR, Estimated 16 (*)    Anion gap 17 (*)    All other components within normal limits  CBC WITH DIFFERENTIAL/PLATELET - Abnormal; Notable for the following components:   WBC 12.1 (*)    RDW 16.6 (*)    Platelets 117 (*)    Neutro Abs 10.8 (*)    Lymphs Abs 0.4 (*)    All other components within normal limits  BRAIN NATRIURETIC PEPTIDE - Abnormal; Notable for the following components:   B Natriuretic Peptide 2,618.0 (*)    All other components within normal limits  BASIC METABOLIC PANEL - Abnormal; Notable for the following components:   Sodium 134 (*)    Potassium 6.2 (*)  Chloride 89 (*)    CO2 34 (*)    Glucose, Bld 103 (*)    BUN 44 (*)    Creatinine, Ser 3.74 (*)    GFR, Estimated 15 (*)    All other components within normal limits  TROPONIN I (HIGH SENSITIVITY) - Abnormal; Notable for the following  components:   Troponin I (High Sensitivity) 247 (*)    All other components within normal limits  TROPONIN I (HIGH SENSITIVITY) - Abnormal; Notable for the following components:   Troponin I (High Sensitivity) 499 (*)    All other components within normal limits  RESP PANEL BY RT-PCR (FLU A&B, COVID) ARPGX2  LIPASE, BLOOD  URINALYSIS, ROUTINE W REFLEX MICROSCOPIC  BASIC METABOLIC PANEL  MAGNESIUM  CBC  COMPREHENSIVE METABOLIC PANEL  CBG MONITORING, ED  TROPONIN I (HIGH SENSITIVITY)  TROPONIN I (HIGH SENSITIVITY)    EKG EKG Interpretation  Date/Time:  Thursday April 19 2021 09:59:19 EDT Ventricular Rate:  81 PR Interval:  154 QRS Duration: 172 QT Interval:  426 QTC Calculation: 495 R Axis:   156 Text Interpretation: Sinus rhythm Right bundle branch block Confirmed by Octaviano Glow [phone]) on 04/19/2021 2:26:01 PM  Radiology CT ABDOMEN PELVIS WO CONTRAST  Result Date: 04/19/2021 CLINICAL DATA:  Concern for bowel obstruction, vomiting for 2 days EXAM: CT ABDOMEN AND PELVIS WITHOUT CONTRAST TECHNIQUE: Multidetector CT imaging of the abdomen and pelvis was performed following the standard protocol without IV contrast. COMPARISON:  None. FINDINGS: Lower chest: There is a trace right pleural effusion with bibasilar subsegmental atelectasis. The heart is enlarged. Coronary artery calcifications are noted. There is trace pericardial fluid. Hepatobiliary: The liver is within normal limits, within the confines of noncontrast technique. Pancreas: The pancreas is atrophic but otherwise unremarkable. There is no focal lesion or contour abnormality. There is no main pancreatic ductal dilatation or peripancreatic inflammatory change. Spleen: Unremarkable. Adrenals/Urinary Tract: The adrenals are unremarkable. There is a 2.2 cm left lower pole renal cyst. There are no other focal lesions. There are no stones. There is no hydronephrosis or hydroureter. The bladder wall is thickened, likely  reflecting chronic outlet obstruction. Stomach/Bowel: There is a moderate hiatal hernia. The stomach is otherwise unremarkable. There is dilation of the third portion of the duodenum with marked narrowing as the duodenum passes between the aneurysmal aorta and the SMV. Otherwise, there is no evidence of bowel obstruction. There is no evidence of bowel wall thickening or inflammatory change. The appendix is normal. There are scattered colonic diverticula without evidence of acute diverticulitis. Vascular/Lymphatic: There is extensive calcified atherosclerotic plaque throughout the arterial vasculature. The infrarenal abdominal aorta is aneurysmal measuring up to 3.5 cm. There is no evidence of rupture. There is no abdominal or pelvic lymphadenopathy. Reproductive: The prostate is enlarged measuring up to 6.4 cm and impresses upon the inferior aspect of the bladder. Other: There is scattered fluid in the abdomen measuring greater than simple fluid attenuation, predominantly around the liver tip and spleen. There is no free intraperitoneal air. There is a small umbilical hernia containing a loop of unobstructed small bowel. Musculoskeletal: There is mild multilevel degenerative change of the spine. There is no acute osseous abnormality or aggressive osseous lesion. IMPRESSION: 1. Abrupt narrowing of the third portion of the duodenum as it passes between the aneurysmal abdominal aorta and SMV with upstream dilation is suggestive of nutcracker phenomenon which may account for the patient's symptoms. No evidence of high-grade bowel obstruction. 2. Scattered ascites throughout the abdomen measuring greater than  simple fluid attenuation. This may reflect proteinaceous debris; however, hemoperitoneum is not excluded. No source of bleeding is identified. 3. Infrarenal abdominal aortic aneurysm measuring up to 3.5 cm without evidence of rupture. Recommend follow-up ultrasound every 2 years. This recommendation follows ACR  consensus guidelines: White Paper of the ACR Incidental Findings Committee II on Vascular Findings. J Am Coll Radiol 2013; 10:789-794. 4. Small ventral abdominal hernia containing a loop of nonobstructed small bowel. 5. Moderate-sized hiatal hernia. 6. Thickened bladder wall likely reflecting chronic outlet obstruction from the enlarged prostate. 7. Cardiomegaly. Electronically Signed   By: Valetta Mole M.D.   On: 04/19/2021 14:24   DG Chest Portable 1 View  Result Date: 04/19/2021 CLINICAL DATA:  Vomiting.  Evaluate for aspiration EXAM: PORTABLE CHEST 1 VIEW COMPARISON:  04/12/2021 FINDINGS: New chronic cardiomegaly and vascular pedicle widening. Interstitial coarsening at the bases which is similar to prior. No focal infiltrate, effusion, or air leak. IMPRESSION: 1. No focal opacity to confirm aspiration. 2. Increased interstitial coarsening which could be from portable technique or mild edema. Electronically Signed   By: Monte Fantasia M.D.   On: 04/19/2021 10:02    Procedures .Critical Care  Date/Time: 04/19/2021 4:56 PM Performed by: Wyvonnia Dusky, MD Authorized by: Wyvonnia Dusky, MD   Critical care provider statement:    Critical care time (minutes):  65   Critical care was necessary to treat or prevent imminent or life-threatening deterioration of the following conditions:  Cardiac failure   Critical care was time spent personally by me on the following activities:  Discussions with consultants, evaluation of patient's response to treatment, examination of patient, ordering and performing treatments and interventions, ordering and review of laboratory studies, ordering and review of radiographic studies, pulse oximetry, re-evaluation of patient's condition, obtaining history from patient or surrogate and review of old charts   Medications Ordered in ED Medications  midodrine (PROAMATINE) tablet 15 mg (has no administration in time range)  sodium chloride flush (NS) 0.9 % injection 3  mL (has no administration in time range)  sodium chloride flush (NS) 0.9 % injection 3 mL (has no administration in time range)  0.9 %  sodium chloride infusion (has no administration in time range)  ondansetron (ZOFRAN) tablet 4 mg (has no administration in time range)    Or  ondansetron (ZOFRAN) injection 4 mg (has no administration in time range)  ondansetron (ZOFRAN) injection 4 mg (4 mg Intravenous Given 04/19/21 0950)  sodium zirconium cyclosilicate (LOKELMA) packet 10 g (10 g Oral Given 04/19/21 1424)  sodium chloride 0.9 % bolus 500 mL (0 mLs Intravenous Stopped 04/19/21 1554)  insulin aspart (novoLOG) injection 5 Units (5 Units Intravenous Given 04/19/21 1545)    And  dextrose 50 % solution 50 mL (50 mLs Intravenous Given 04/19/21 1544)    ED Course  I have reviewed the triage vital signs and the nursing notes.  Pertinent labs & imaging results that were available during my care of the patient were reviewed by me and considered in my medical decision making (see chart for details).  Patient presents with nausea and vomiting from home after recent hospital discharge 2 days ago.  I personally reviewed his prior medical records including his recent hospital stay and discharge.  He believes this is a side effect to his new medications, which appear to be [place] and Midodrine.  He is clinically stable on arrival.  No acute abdominal tenderness.  He is hiccuping multiple times on exam.  Differential diagnosis  includes medication side effect versus gastritis versus bowel obstruction versus other intra-abdominal process versus atypical ACS versus other.  We will check his labs here.  Initial presentation is not consistent with sepsis or infection.  However we can check a COVID test as this may be a symptom of viral infection and he was recently hospitalized.   IV zofran ordered. He reports he took some medications prior to arrival.  *  Labs reviewed as noted below - appears to be an AKI  (cardiorenal syndrome? Or secondary to diuresis/vomiting at home.  Trop's 200->400's, likely 2/2 demand ischemia - no chest pain, ECG does not suggest STEMI, less likely ACS K initially elevated likely 2/2 renal issues.  Per cardiology consultation, patient given IV fluid bolus (felt to be volume depleted) and Lokelma, but had repeat BMP drawn before interventions.  Will need another BMP  Imaging reviewed- xray consistent with pulm edema in chest view; ct abdomen for nausea/vomiting suggestive of duodenal narrowing/SMA syndrome, although no obvious sign of obstruction - this may be the source of his nausea  Prior medical records reviewed including recent hospitalization Supplemental history provided by patient's wife at bedside  Cardiology consulted   Clinical Course as of 04/19/21 1701  Thu Apr 19, 2021  0932 Patient is hypotensive but the systolic pressure appears to be within his normal limits per recent hospitalization review, SBP averaged 70-90 in the hospital per discharge summary [MT]  1137 Patient has developed an AKI from 2 days ago.  This appears to be prerenal.  However he also has clinical signs of worsening congestive heart failure.  Due to the complicated medical history of placed a consult to cardiology service.  [MT]  [id number] Cardiology consulted. [MT]  1146 Pressures improved with midodrine back to baseline levels.  CT abdomen ordered, as his wife is now present at bedside reports that his vomit appeared dark.  I wonder whether he may have developed an ileus or an obstruction.  He did have a bowel movement 2 days ago.  He denies any abdominal pain.  Of note, his liver enzymes are somewhat elevated, lipase is normal, and he reports a history of cholecystectomy. [MT]  1358 BP improving [MT]  1431 IMPRESSION: 1. Abrupt narrowing of the third portion of the duodenum as it passes between the aneurysmal abdominal aorta and SMV with upstream dilation is suggestive of nutcracker phenomenon  which may account for the patient's symptoms. No evidence of high-grade bowel obstruction. 2. Scattered ascites throughout the abdomen measuring greater than simple fluid attenuation. This may reflect proteinaceous debris; however, hemoperitoneum is not excluded. No source of bleeding is identified. 3. Infrarenal abdominal aortic aneurysm measuring up to 3.5 cm without evidence of rupture. Recommend follow-up ultrasound every 2 years. This recommendation follows ACR consensus guidelines: White Paper of the ACR Incidental Findings Committee II on Vascular Findings. J Am Coll Radiol 2013; 10:789-794. 4. Small ventral abdominal hernia containing a loop of nonobstructed small bowel. 5. Moderate-sized hiatal hernia. 6. Thickened bladder wall likely reflecting chronic outlet obstruction from the enlarged prostate. 7. Cardiomegaly. [MT]  2263 Patient signed out to Dr Rogers Blocker hospitalist for step down admission.  I confirmed with dr bensimohn and dr dalton from cardiology that this patient is likely volume down, and they believe his Cr and K will respond to th eIV fluids and lokelma - advised a recheck now.  I also discussed the possible nutcracker syndrome with the hospitalist.  This is not an acute obstruction. [MT]  1512 My reassessment  however the patient is maintaining baseline blood pressure, good mentation, appears comfortable. [MT]    Clinical Course User Index [MT] Wyvonnia Dusky, MD    Final Clinical Impression(s) / ED Diagnoses Final diagnoses:  AKI (acute kidney injury) ([place])  Congestive heart failure, unspecified HF chronicity, unspecified heart failure type ([place])  Hyperkalemia    Rx / DC Orders ED Discharge Orders     None        Wyvonnia Dusky, MD 04/19/21 1701

## 2021-04-19 NOTE — ED Triage Notes (Signed)
Patient presents to ed via [facility] from home states he was discharged from the hospital Tues after new onset CHF, states wed he started felling bad with nausea , states states he self cath's 3 times a day x 2 years. States he started taking a new medication in the hospital for b/p and it makes him sick.

## 2021-04-19 NOTE — ED Notes (Signed)
Ultrasound tech at bedside.

## 2021-04-20 ENCOUNTER — Inpatient Hospital Stay (HOSPITAL_COMMUNITY): Payer: Medicare Other

## 2021-04-20 DIAGNOSIS — R112 Nausea with vomiting, unspecified: Secondary | ICD-10-CM

## 2021-04-20 DIAGNOSIS — N179 Acute kidney failure, unspecified: Secondary | ICD-10-CM | POA: Diagnosis not present

## 2021-04-20 DIAGNOSIS — I50812 Chronic right heart failure: Secondary | ICD-10-CM | POA: Diagnosis not present

## 2021-04-20 LAB — URINALYSIS, ROUTINE W REFLEX MICROSCOPIC
Bilirubin Urine: NEGATIVE
Glucose, UA: NEGATIVE mg/dL
Ketones, ur: NEGATIVE mg/dL
Leukocytes,Ua: NEGATIVE
Nitrite: NEGATIVE
Protein, ur: 30 mg/dL — AB
Specific Gravity, Urine: 1.01 (ref 1.005–1.030)
pH: 5 (ref 5.0–8.0)

## 2021-04-20 LAB — COMPREHENSIVE METABOLIC PANEL
ALT: 879 U/L — ABNORMAL HIGH (ref 0–44)
AST: 1475 U/L — ABNORMAL HIGH (ref 15–41)
Albumin: 2.8 g/dL — ABNORMAL LOW (ref 3.5–5.0)
Alkaline Phosphatase: 95 U/L (ref 38–126)
Anion gap: 12 (ref 5–15)
BUN: 50 mg/dL — ABNORMAL HIGH (ref 8–23)
CO2: 33 mmol/L — ABNORMAL HIGH (ref 22–32)
Calcium: 9.4 mg/dL (ref 8.9–10.3)
Chloride: 89 mmol/L — ABNORMAL LOW (ref 98–111)
Creatinine, Ser: 3.7 mg/dL — ABNORMAL HIGH (ref 0.61–1.24)
GFR, Estimated: 16 mL/min — ABNORMAL LOW (ref >60)
Glucose, Bld: 78 mg/dL (ref 70–99)
Potassium: 4.6 mmol/L (ref 3.5–5.1)
Sodium: 134 mmol/L — ABNORMAL LOW (ref 135–145)
Total Bilirubin: 1.8 mg/dL — ABNORMAL HIGH (ref 0.3–1.2)
Total Protein: 6.2 g/dL — ABNORMAL LOW (ref 6.5–8.1)

## 2021-04-20 LAB — PROTEIN / CREATININE RATIO, URINE
Creatinine, Urine: 93.81 mg/dL
Protein Creatinine Ratio: 0.58 mg/mg{Cre} — ABNORMAL HIGH (ref 0.00–0.15)
Total Protein, Urine: 54 mg/dL

## 2021-04-20 LAB — CBC
HCT: 42.7 % (ref 39.0–52.0)
Hemoglobin: 13.8 g/dL (ref 13.0–17.0)
MCH: 28.2 pg (ref 26.0–34.0)
MCHC: 32.3 g/dL (ref 30.0–36.0)
MCV: 87.3 fL (ref 80.0–100.0)
Platelets: 112 10*3/uL — ABNORMAL LOW (ref 150–400)
RBC: 4.89 MIL/uL (ref 4.22–5.81)
RDW: 16 % — ABNORMAL HIGH (ref 11.5–15.5)
WBC: 8.8 10*3/uL (ref 4.0–10.5)
nRBC: 0 % (ref 0.0–0.2)

## 2021-04-20 LAB — PROCALCITONIN: Procalcitonin: 3.35 ng/mL

## 2021-04-20 LAB — SODIUM, URINE, RANDOM: Sodium, Ur: 27 mmol/L

## 2021-04-20 MED ORDER — RIOCIGUAT 1.5 MG PO TABS
1.5000 mg | ORAL_TABLET | Freq: Three times a day (TID) | ORAL | Status: DC
  Administered 2021-04-20 – 2021-04-30 (×29): 1.5 mg via ORAL
  Filled 2021-04-20 (×31): qty 1

## 2021-04-20 MED ORDER — SODIUM CHLORIDE 0.9 % IV SOLN: INTRAVENOUS | Status: AC

## 2021-04-20 MED ORDER — DOXAZOSIN MESYLATE 2 MG PO TABS
4.0000 mg | ORAL_TABLET | Freq: Every day | ORAL | Status: DC
  Administered 2021-04-20: 4 mg via ORAL
  Filled 2021-04-20 (×2): qty 2

## 2021-04-20 MED ORDER — RIOCIGUAT 1.5 MG PO TABS
1.5000 mg | ORAL_TABLET | Freq: Three times a day (TID) | ORAL | Status: DC
  Filled 2021-04-20: qty 1

## 2021-04-20 NOTE — Progress Notes (Signed)
PROGRESS NOTE    Joshua [name]  EHU:[id number] DOB: [birth year] DOA: 04/19/2021 PCP: [name], DO    Brief Narrative:  Joshua [name] is an 85 year old male with past medical history significant for chronic diastolic CHF, chronic RV failure, hypotension on midodrine, COPD on 3 L nasal cannula at baseline, BPH, CKD stage IIIa, PE on Eliquis who presented to [facility] ED on 9/1 with intractable nausea/vomiting.  Patient reports onset Tuesday night with at least 20 episodes of emesis on Wednesday.  Denies hematemesis and no associated abdominal pain.  Patient also reports decreased urine output, self caths about 3 times a day.  Denies any dysuria, or color change within his urine.  Further denies fevers, chills, shortness of breath, coughing, no chest pain/palpitations, no diarrhea/constipation.  Denies any increased leg swelling.  Recently discharged on 8/30 for CHF exacerbation.  Was initially started on midodrine and [place].  In the ED, temperature 99.1 F, HR 87, RR 20, BP 61/41, SPO2 93% on 3 L nasal cannula.  Sodium 135, potassium 5.5, chloride 89, CO2 29, glucose 117, BUN 41, creatinine 3.69.  WBC 12.1, hemoglobin 1214.7, platelets 117.  Lipase 24, AST 416, ALT 197.  BNP 2618.  High sensitive troponin 247>499.  Chest x-ray with no focal opacity, increased interstitial coarsening/mild edema.  EKG with normal sinus rhythm, rate 83, QTc 495.  CT abdomen/pelvis without contrast with abrupt narrowing third portion of duodenum between aneurysmal abdominal aorta or and SMV with upstream dilation suggestive of nutcracker phenomenon, no evidence of high-grade bowel obstruction, infrarenal abdominal aortic aneurysm 3.5 cm, moderate hiatal hernia, thickened bladder, cardiomegaly, small ventral abdominal hernia containing a loop of nonobstructed small bowel.  Cardiology and general surgery were consulted.  Duration consulted for further evaluation and management of intractable nausea vomiting, hypotension,  elevated troponin, transaminitis.   Assessment & Plan:   Principal Problem:   Intractable nausea and vomiting Active Problems:   History of pulmonary embolism   Chronic respiratory failure with hypoxia (HCC)   COPD (chronic obstructive pulmonary disease) (HCC)   BPH with urinary obstruction   Chronic right-sided heart failure (HCC)   Acute renal failure superimposed on stage 3 chronic kidney disease (HCC)   Hyperkalemia   Transaminitis   Elevated troponin   Duodenal anomaly   Intractable nausea/vomiting Duodenal stenosis Patient presenting to ED with persistent intractable nausea/vomiting.  CT abdomen/pelvis notable for abrupt narrowing third portion of duodenum without high-grade obstruction; concern for possible nutcracker phenomenon.  Denies any abdominal discomfort --General surgery following, appreciate assistance --Started on clear liquid diet --Zofran as needed --Surgery recommending upper GI series  Transaminitis These are new findings with LFTs that were normal on 8/25.  No history of cirrhosis or portal hypertension's.  No abnormal findings noted on CT abdomen/pelvis other than some scattered ascites.  Acute hepatitis panel negative.  High suspicion from shock liver with significant hypotension. --AST [id number] (20 on 8/27) --ALT 197>879 (12 on 8/27) --Avoid hepatotoxins, especially as Tylenol/statins --monitor LFTs daily  Hypotension Patient recently admitted and discharged following CHF exacerbation with noted hypotension with recent started on midodrine.  Patient was noted to have significant hypotension on arrival with SBP 61. --Midodrine increased to 50 mg p.o. 3 times daily --Continue monitor BP closely  Elevated troponin High sensitive troponin [phone].  Etiology likely secondary to demand ischemia from acute illness/hypotension.  Denies chest pain. --Cardiology following, continue to monitor on telemetry  Acute renal failure on CKD stage IIIa On  arrival, patient's creatinine elevated at 3.87.  Baseline Cr 1.5.  Etiology likely secondary to ATN from significant hypotension versus prerenal azotemia from dehydration with intractable nausea vomiting; also concern for cardiorenal syndrome. FeNa = 0.8%; which is more consistent with a prerenal azotemia. --Cr 3.87>3.70 --Gentle IVF hydration with NS at 75 MLS per hour x1 day --Avoid nephrotoxins, renal dose all medications --BMP daily  Hyperkalemia: Resolved Potassium elevated at 6.2 on admission.  Patient was given NovoLog, dextrose and Lokelma.  Etiology likely secondary to acute renal failure as above vs home K supplement. --K 6.2>>4.6 --Home potassium supplement has been discontinued --Monitor on telemetry --Follow BMP daily  Chronic diastolic congestive heart failure Pulmonary hypertension Chronic right-sided heart failure TTE 09/2020 with LVEF 55-60%, fairly reduced RV systolic function, RV severely enlarged, moderate pulmonary artery systolic pressure.  Recently hospitalized and discharged on 8/30 for CHF exacerbation, with 20 pound weight loss.  At baseline on torsemide 80 mg p.o. every morning and 6 mg p.o. every afternoon. --Cardiology following, appreciate assistance --Currently holding diuretics in the setting of renal failure on IV fluid hydration --Strict I's and O's and daily weights  COPD, oxygen dependent Chronic hypoxic respiratory failure At baseline on 3 L nasal cannula.  PFTs with severe obstruction and moderate restriction on 6/22.  Not in acute exacerbation. --Incruse Ellipta 62.5 mcg 1 puff daily --Albuterol neb every 4 hours as needed shortness of breath/wheezing --Continue supplemental oxygen, maintain SPO2 greater than 88%.  Hx pulmonary embolism --Eliquis 5 mg p.o. twice daily  Hx CTEPH --riociguat 1.22m BID  Hx OSA --Intolerant to CPAP  Infrarenal abdominal aortic aneurysm Measuring up to 3.5 cm on imaging.  Recommend repeat ultrasound every 2  years.  GERD: Protonix 40 mg p.o. daily  BPH/chronic urinary retention Patient reports self catheterize up to 3 times daily. --Finasteride 5 mg p.o. nightly   DVT prophylaxis: Place TED hose Start: 04/19/21 1341 apixaban (ELIQUIS) tablet 5 mg    Code Status: Full Code Family Communication: Updated family present at bedside this morning  Disposition Plan:  Level of care: Progressive Status is: Inpatient  Remains inpatient appropriate because:Ongoing diagnostic testing needed not appropriate for outpatient work up, Unsafe d/c plan, IV treatments appropriate due to intensity of illness or inability to take PO, and Inpatient level of care appropriate due to severity of illness  Dispo: The patient is from: Home              Anticipated d/c is to: Home              Patient currently is not medically stable to d/c.   Difficult to place patient No   Consultants:  Cardiology/heart failure team, Dr. MAundra DubinGeneral surgery, Dr. TGeorgette Dover Procedures:  None  Antimicrobials:  None   Subjective: Patient seen examined at bedside, resting comfortably.  No specific complaints this morning.  Blood pressure much improved on increased midodrine dose.  Family present at bedside and updated.  LFTs continue to trend up, but patient with no symptoms.  Patient states would like something to eat.  No further nausea/vomiting since presentation.  Seen by general surgery and started on clear liquid diet and recommended upper GI series.  Also seen by cardiology this morning.  No other complaints or concerns at this time.  Currently denies headache, no chest pain, no palpitations, no shortness of breath more than his normal baseline, no abdominal pain, no weakness, no fatigue, no active nausea/vomiting, no diarrhea, no weakness, no fatigue, no paresthesias.  No acute events overnight per nursing  staff.  Objective: Vitals:   04/19/21 2351 04/20/21 0417 04/20/21 0830 04/20/21 1205  BP: (!) 96/58 101/60 94/64  96/63  Pulse: 73 75 77 86  Resp: _0 Temp: 97.7 F (36.5 C) 97.7 F (36.5 C) (!) 97.3 F (36.3 C) (!) 97.5 F (36.4 C)  TempSrc: Oral Axillary Oral Oral  SpO2: 100% 100% 96% 97%  Weight:  100.8 kg    Height:        Intake/Output Summary (Last 24 hours) at 04/20/2021 1256 Last data filed at 04/20/2021 1205 Gross per 24 hour  Intake 500 ml  Output 950 ml  Net -450 ml   Filed Weights   04/19/21 1710 04/19/21 1723 04/20/21 0417  Weight: 98.3 kg 98.3 kg 100.8 kg    Examination:  General exam: Appears calm and comfortable, elderly/chronically ill in appearance Respiratory system: Clear to auscultation. Respiratory effort normal.  On 3 L nasal cannula which is his baseline Cardiovascular system: S1 & S2 heard, RRR. No JVD, murmurs, rubs, gallops or clicks. No pedal edema. Gastrointestinal system: Abdomen is nondistended, soft and nontender. No organomegaly or masses felt. Normal bowel sounds heard. Central nervous system: Alert and oriented. No focal neurological deficits. Extremities: Symmetric 5 x 5 power. Skin: No rashes, lesions or ulcers Psychiatry: Judgement and insight appear normal. Mood & affect appropriate.     Data Reviewed: I have personally reviewed following labs and imaging studies  CBC: Recent Labs  Lab 04/14/21 0325 04/16/21 0131 04/17/21 0210 04/19/21 0915 04/20/21 0119  WBC 3.5* 4.0 4.5 12.1* 8.8  NEUTROABS 2.0  --  2.8 10.8*  --   HGB 14.6 14.8 14.7 14.7 13.8  HCT 44.6 44.4 44.2 45.2 42.7  MCV 86.6 85.4 86.0 89.2 87.3  PLT 150 153 161 117* 820*   Basic Metabolic Panel: Recent Labs  Lab 04/14/21 0325 04/15/21 0520 04/16/21 0131 04/17/21 0210 04/19/21 0915 04/19/21 1255 04/19/21 1713 04/19/21 1718 04/20/21 0119  NA 138 137 136 136 135 134* 133*  --  134*  K 3.8 3.3* 3.7 3.6 5.5* 6.2* 4.8  --  4.6  CL 99 94* 93* 90* 89* 89* 90*  --  89*  CO2 30 36* 36* 38* 29 34* 34*  --  33*  GLUCOSE 80 90 95 94 117* 103* 71  --  78  BUN 26* _1 41* 44* 45*  --  50*  CREATININE 1.48* 1.41* 1.39* 1.47* 3.69* 3.74* 3.87*  --  3.70*  CALCIUM 9.0 9.1 9.1 9.1 9.8 9.5 9.5  --  9.4  MG 1.5* 2.1 1.8 1.7  --   --   --  2.1  --   PHOS 3.5  --   --   --   --   --   --   --   --    GFR: Estimated Creatinine Clearance: 18.1 mL/min (A) (by C-G formula based on SCr of 3.7 mg/dL (H)). Liver Function Tests: Recent Labs  Lab 04/14/21 0325 04/19/21 0915 04/20/21 0119  AST 20 416* 1,475*  ALT 12 197* 879*  ALKPHOS 92 107 95  BILITOT 1.2 2.3* 1.8*  PROT 5.6* 6.8 6.2*  ALBUMIN 2.6* 3.1* 2.8*   Recent Labs  Lab 04/19/21 0915  LIPASE 24   No results for input(s): AMMONIA in the last 168 hours. Coagulation Profile: No results for input(s): INR, PROTIME in the last 168 hours. Cardiac Enzymes: No results for input(s): CKTOTAL, CKMB, CKMBINDEX, TROPONINI in the last 168 hours. BNP (  last 3 results) No results for input(s): PROBNP in the last 8760 hours. HbA1C: No results for input(s): HGBA1C in the last 72 hours. CBG: Recent Labs  Lab 04/19/21 1506  GLUCAP 92   Lipid Profile: No results for input(s): CHOL, HDL, LDLCALC, TRIG, CHOLHDL, LDLDIRECT in the last 72 hours. Thyroid Function Tests: No results for input(s): TSH, T4TOTAL, FREET4, T3FREE, THYROIDAB in the last 72 hours. Anemia Panel: No results for input(s): VITAMINB12, FOLATE, FERRITIN, TIBC, IRON, RETICCTPCT in the last 72 hours. Sepsis Labs: Recent Labs  Lab 04/20/21 0119  PROCALCITON 3.35    Recent Results (from the past 240 hour(s))  Resp Panel by RT-PCR (Flu A&B, Covid) Nasopharyngeal Swab     Status: None   Collection Time: 04/12/21  3:45 PM   Specimen: Nasopharyngeal Swab; Nasopharyngeal(NP) swabs in vial transport medium  Result Value Ref Range Status   SARS Coronavirus 2 by RT PCR NEGATIVE NEGATIVE Final    Comment: (NOTE) SARS-CoV-2 target nucleic acids are NOT DETECTED.  The SARS-CoV-2 RNA is generally detectable in upper respiratory specimens during  the acute phase of infection. The lowest concentration of SARS-CoV-2 viral copies this assay can detect is 138 copies/mL. A negative result does not preclude SARS-Cov-2 infection and should not be used as the sole basis for treatment or other patient management decisions. A negative result may occur with  improper specimen collection/handling, submission of specimen other than nasopharyngeal swab, presence of viral mutation(s) within the areas targeted by this assay, and inadequate number of viral copies(<138 copies/mL). A negative result must be combined with clinical observations, patient history, and epidemiological information. The expected result is Negative.  Fact Sheet for Patients:  EntrepreneurPulse.com.au  Fact Sheet for Healthcare Providers:  IncredibleEmployment.be  This test is no t yet approved or cleared by the [place] FDA and  has been authorized for detection and/or diagnosis of SARS-CoV-2 by FDA under an Emergency Use Authorization (EUA). This EUA will remain  in effect (meaning this test can be used) for the duration of the COVID-19 declaration under Section 564(b)(1) of the Act, 21 U.S.C.section 360bbb-3(b)(1), unless the authorization is terminated  or revoked sooner.       Influenza A by PCR NEGATIVE NEGATIVE Final   Influenza B by PCR NEGATIVE NEGATIVE Final    Comment: (NOTE) The Xpert Xpress SARS-CoV-2/FLU/RSV plus assay is intended as an aid in the diagnosis of influenza from Nasopharyngeal swab specimens and should not be used as a sole basis for treatment. Nasal washings and aspirates are unacceptable for Xpert Xpress SARS-CoV-2/FLU/RSV testing.  Fact Sheet for Patients: EntrepreneurPulse.com.au  Fact Sheet for Healthcare Providers: IncredibleEmployment.be  This test is not yet approved or cleared by the [place] FDA and has been authorized for detection and/or  diagnosis of SARS-CoV-2 by FDA under an Emergency Use Authorization (EUA). This EUA will remain in effect (meaning this test can be used) for the duration of the COVID-19 declaration under Section 564(b)(1) of the Act, 21 U.S.C. section 360bbb-3(b)(1), unless the authorization is terminated or revoked.  Performed at [facility], [place] [address], [place], [place] [address]   Resp Panel by RT-PCR (Flu A&B, Covid) Nasopharyngeal Swab     Status: None   Collection Time: 04/19/21  9:34 AM   Specimen: Nasopharyngeal Swab; Nasopharyngeal(NP) swabs in vial transport medium  Result Value Ref Range Status   SARS Coronavirus 2 by RT PCR NEGATIVE NEGATIVE Final    Comment: (NOTE) SARS-CoV-2 target nucleic acids are NOT DETECTED.  The SARS-CoV-2 RNA is generally detectable in upper respiratory specimens during the acute phase of infection. The lowest concentration of SARS-CoV-2 viral copies this assay can detect is 138 copies/mL. A negative result does not preclude SARS-Cov-2 infection and should not be used as the sole basis for treatment or other patient management decisions. A negative result may occur with  improper specimen collection/handling, submission of specimen other than nasopharyngeal swab, presence of viral mutation(s) within the areas targeted by this assay, and inadequate number of viral copies(<138 copies/mL). A negative result must be combined with clinical observations, patient history, and epidemiological information. The expected result is Negative.  Fact Sheet for Patients:  EntrepreneurPulse.com.au  Fact Sheet for Healthcare Providers:  IncredibleEmployment.be  This test is no t yet approved or cleared by the [place] FDA and  has been authorized for detection and/or diagnosis of SARS-CoV-2 by FDA under an Emergency Use Authorization (EUA). This EUA will remain  in effect (meaning this test can be used) for the duration  of the COVID-19 declaration under Section 564(b)(1) of the Act, 21 U.S.C.section 360bbb-3(b)(1), unless the authorization is terminated  or revoked sooner.       Influenza A by PCR NEGATIVE NEGATIVE Final   Influenza B by PCR NEGATIVE NEGATIVE Final    Comment: (NOTE) The Xpert Xpress SARS-CoV-2/FLU/RSV plus assay is intended as an aid in the diagnosis of influenza from Nasopharyngeal swab specimens and should not be used as a sole basis for treatment. Nasal washings and aspirates are unacceptable for Xpert Xpress SARS-CoV-2/FLU/RSV testing.  Fact Sheet for Patients: EntrepreneurPulse.com.au  Fact Sheet for Healthcare Providers: IncredibleEmployment.be  This test is not yet approved or cleared by the [place] FDA and has been authorized for detection and/or diagnosis of SARS-CoV-2 by FDA under an Emergency Use Authorization (EUA). This EUA will remain in effect (meaning this test can be used) for the duration of the COVID-19 declaration under Section 564(b)(1) of the Act, 21 U.S.C. section 360bbb-3(b)(1), unless the authorization is terminated or revoked.  Performed at [facility], [place] [address] [place], [place] [address]          Radiology Studies: CT ABDOMEN PELVIS WO CONTRAST  Result Date: 04/19/2021 CLINICAL DATA:  Concern for bowel obstruction, vomiting for 2 days EXAM: CT ABDOMEN AND PELVIS WITHOUT CONTRAST TECHNIQUE: Multidetector CT imaging of the abdomen and pelvis was performed following the standard protocol without IV contrast. COMPARISON:  None. FINDINGS: Lower chest: There is a trace right pleural effusion with bibasilar subsegmental atelectasis. The heart is enlarged. Coronary artery calcifications are noted. There is trace pericardial fluid. Hepatobiliary: The liver is within normal limits, within the confines of noncontrast technique. Pancreas: The pancreas is atrophic but otherwise unremarkable. There is no  focal lesion or contour abnormality. There is no main pancreatic ductal dilatation or peripancreatic inflammatory change. Spleen: Unremarkable. Adrenals/Urinary Tract: The adrenals are unremarkable. There is a 2.2 cm left lower pole renal cyst. There are no other focal lesions. There are no stones. There is no hydronephrosis or hydroureter. The bladder wall is thickened, likely reflecting chronic outlet obstruction. Stomach/Bowel: There is a moderate hiatal hernia. The stomach is otherwise unremarkable. There is dilation of the third portion of the duodenum with marked narrowing as the duodenum passes between the aneurysmal aorta and the SMV. Otherwise, there is no evidence of bowel obstruction. There is no evidence of bowel wall thickening or inflammatory change. The appendix is normal. There are scattered colonic diverticula without evidence of acute  diverticulitis. Vascular/Lymphatic: There is extensive calcified atherosclerotic plaque throughout the arterial vasculature. The infrarenal abdominal aorta is aneurysmal measuring up to 3.5 cm. There is no evidence of rupture. There is no abdominal or pelvic lymphadenopathy. Reproductive: The prostate is enlarged measuring up to 6.4 cm and impresses upon the inferior aspect of the bladder. Other: There is scattered fluid in the abdomen measuring greater than simple fluid attenuation, predominantly around the liver tip and spleen. There is no free intraperitoneal air. There is a small umbilical hernia containing a loop of unobstructed small bowel. Musculoskeletal: There is mild multilevel degenerative change of the spine. There is no acute osseous abnormality or aggressive osseous lesion. IMPRESSION: 1. Abrupt narrowing of the third portion of the duodenum as it passes between the aneurysmal abdominal aorta and SMV with upstream dilation is suggestive of nutcracker phenomenon which may account for the patient's symptoms. No evidence of high-grade bowel obstruction. 2.  Scattered ascites throughout the abdomen measuring greater than simple fluid attenuation. This may reflect proteinaceous debris; however, hemoperitoneum is not excluded. No source of bleeding is identified. 3. Infrarenal abdominal aortic aneurysm measuring up to 3.5 cm without evidence of rupture. Recommend follow-up ultrasound every 2 years. This recommendation follows ACR consensus guidelines: White Paper of the ACR Incidental Findings Committee II on Vascular Findings. J Am Coll Radiol 2013; 10:789-794. 4. Small ventral abdominal hernia containing a loop of nonobstructed small bowel. 5. Moderate-sized hiatal hernia. 6. Thickened bladder wall likely reflecting chronic outlet obstruction from the enlarged prostate. 7. Cardiomegaly. Electronically Signed   By: Valetta Mole M.D.   On: 04/19/2021 14:24   US RENAL  Result Date: 04/19/2021 CLINICAL DATA:  Acute kidney injury EXAM: RENAL / URINARY TRACT ULTRASOUND COMPLETE COMPARISON:  CT 04/19/2021 FINDINGS: Right Kidney: Renal measurements: 10.4 x 5.4 x 5.3 cm = volume: 155 mL. Echogenicity within normal limits. No mass, shadowing stone, or hydronephrosis visualized. Left Kidney: Renal measurements: 10.9 x 5.5 x 5.9 cm = volume: 185 mL. Echogenicity within normal limits. 2.8 cm cyst within the interpolar region of the left kidney. No shadowing stone or hydronephrosis visualized. Bladder: Diffuse urinary bladder wall thickening. Other: Prostatomegaly. IMPRESSION: 1. No renal stone or hydronephrosis. 2. Prostatomegaly with diffuse urinary bladder wall thickening, which may be related to chronic outlet obstruction. Electronically Signed   By: Davina Poke D.O.   On: 04/19/2021 17:40   DG Chest Portable 1 View  Result Date: 04/19/2021 CLINICAL DATA:  Vomiting.  Evaluate for aspiration EXAM: PORTABLE CHEST 1 VIEW COMPARISON:  04/12/2021 FINDINGS: New chronic cardiomegaly and vascular pedicle widening. Interstitial coarsening at the bases which is similar to prior.  No focal infiltrate, effusion, or air leak. IMPRESSION: 1. No focal opacity to confirm aspiration. 2. Increased interstitial coarsening which could be from portable technique or mild edema. Electronically Signed   By: Monte Fantasia M.D.   On: 04/19/2021 10:02   US Abdomen Limited RUQ (LIVER/GB)  Result Date: 04/19/2021 CLINICAL DATA:  Transaminitis EXAM: ULTRASOUND ABDOMEN LIMITED RIGHT UPPER QUADRANT COMPARISON:  CT 04/19/2021 FINDINGS: Gallbladder: Surgically absent Common bile duct: Diameter: 4.3 mm Liver: No focal lesion identified. Within normal limits in parenchymal echogenicity. Portal vein is patent on color Doppler imaging with normal direction of blood flow towards the liver. Other: Trace ascites adjacent to the liver IMPRESSION: Status post cholecystectomy.  Small amount of perihepatic ascites Electronically Signed   By: Donavan Foil M.D.   On: 04/19/2021 22:48        Scheduled Meds:  allopurinol  100 mg Oral q morning   apixaban  5 mg Oral BID   finasteride  5 mg Oral QHS   gabapentin  100 mg Oral TID   midodrine  15 mg Oral TID WC   pantoprazole  40 mg Oral q morning   Riociguat  1.5 mg Oral TID   sodium chloride flush  3 mL Intravenous Q12H   umeclidinium bromide  1 puff Inhalation QPM   Continuous Infusions:  sodium chloride     sodium chloride 75 mL/hr at 04/20/21 0900     LOS: 1 day    Time spent: 46 minutes spent on chart review, discussion with nursing staff, consultants, updating family and interview/physical exam; more than 50% of that time was spent in counseling and/or coordination of care.    Elin Fenley J [place] DO [facility] Available via Epic secure chat 7am-7pm After these hours, please refer to coverage provider listed on [email or web address] 04/20/2021, 12:56 PM

## 2021-04-20 NOTE — Consult Note (Addendum)
Consult Note  Joshua [name] 03-05-1936  [id number].    Requesting MD: Dr. Orma Flaming Chief Complaint/Reason for Consult: Nausea, emesis  HPI:  85 year old male with medical history significant for CHF, COPD, reflux, pulmonary hypertension, CKD, PE on Eliquis who presented to the [facility] emergency department on 04/19/2021 due to nausea and emesis.  He was recently discharged from the hospital, 3 days ago, after admission for CHF exacerbation.  Patient was concerned his medications were causing his nausea/vomiting and he did not take his midodrine the morning of presentation because of this.  Nausea and dry heaving began the night prior to presentation. He reports emesis x1. Upon further discussion with patient, his wife, and his daughter they tell me nausea and emesis have actually been problematic for the past 6 to 8 months related to hiccups.  He has been evaluated by Dr. Benson Norway for this including an EGD in March of this year which showed Candida esophagitis.  He was started on gabapentin with significant improvement to resolution of his symptoms. Daughter states patient lost up to 20 pounds due to hiccups/nausea/emesis.  He states a couple weeks ago prior to his most recent admission he had episodes of hiccups/nausea/emesis but did not report to the hospital and symptoms resolved.  He again though it could be related to midodrine and stopped taking it.  He denies abdominal pain or nausea currently. His bowel movements have been normal. Last BM 2 days ago. Had low urinary output with self cath recently - otherwise no urinary complaints. ROS otherwise as below  Work-up in the ED was significant for afebrile, WBC 12.1. CT abdomen/pelvis without contrast showing abrupt narrowing of the third portion of the duodenum as it passes between the aneurysmal abdominal aorta and SMV with upstream dilation.  No evidence of high-grade bowel obstruction.  We were asked to see in regard to his duodenal  narrowing  He has history of cholecystectomy  ROS: Review of Systems  Constitutional:  Positive for weight loss. Negative for chills and fever.  Respiratory:  Negative for cough and shortness of breath.   Cardiovascular:  Negative for chest pain and palpitations.  Gastrointestinal:  Positive for nausea and vomiting. Negative for abdominal pain, constipation and diarrhea.  Genitourinary: Negative.    Family History  Problem Relation Age of Onset   Heart attack Brother 107    Past Medical History:  Diagnosis Date   Arthritis    BPH (benign prostatic hyperplasia)    COPD (chronic obstructive pulmonary disease) (HCC)    Folliculitis    posterior scalp per office visit note of Dr Maudie Mercury 07/20/2014     GERD (gastroesophageal reflux disease)    Hypertension    PE (pulmonary thromboembolism) ([place])    Phlebitis    right arm  at least 20 years ago    Pneumonia    hx of pneumonia as a child    Sleep apnea     Past Surgical History:  Procedure Laterality Date   BIOPSY  11/03/2020   Procedure: BIOPSY;  Surgeon: Carol Ada, MD;  Location: [facility];  Service: Endoscopy;;   bone removed from little toe right foot      CHOLECYSTECTOMY     ESOPHAGOGASTRODUODENOSCOPY (EGD) WITH PROPOFOL N/A 11/03/2020   Procedure: ESOPHAGOGASTRODUODENOSCOPY (EGD) WITH PROPOFOL;  Surgeon: Carol Ada, MD;  Location: [facility];  Service: Endoscopy;  Laterality: N/A;   pilonidal cyst removal      RIGHT HEART CATH N/A 12/13/2020  Procedure: RIGHT HEART CATH;  Surgeon: Larey Dresser, MD;  Location: [facility];  Service: Cardiovascular;  Laterality: N/A;   TOTAL KNEE ARTHROPLASTY Left 09/13/2014   Procedure: LEFT TOTAL KNEE ARTHROPLASTY;  Surgeon: Mauri Pole, MD;  Location: [facility];  Service: Orthopedics;  Laterality: Left;    Social History:  reports that he quit smoking about 14 years ago. His smoking use included cigarettes. He started smoking about 66 years ago. He has a 50.00 pack-year  smoking history. He has never used smokeless tobacco. He reports that he does not currently use alcohol after a past usage of about 6.0 standard drinks per week. He reports that he does not use drugs.  Allergies:  Allergies  Allergen Reactions   Other Swelling    Beer- Swelling    Sunflower Oil Swelling   Sulfa Antibiotics Rash    Medications Prior to Admission  Medication Sig Dispense Refill   albuterol (VENTOLIN HFA) 108 (90 Base) MCG/ACT inhaler Inhale 1 puff into the lungs every 4 (four) hours as needed for shortness of breath.     allopurinol (ZYLOPRIM) 100 MG tablet Take 100 mg by mouth every morning.     apixaban (ELIQUIS) 5 MG TABS tablet Take 1 tablet (5 mg total) by mouth 2 (two) times daily. 60 tablet    Azelastine HCl 0.15 % SOLN Place 2 sprays into the nose daily as needed (allergies).     Cinnamon 500 MG capsule Take 500 mg by mouth every morning.     dapagliflozin propanediol (FARXIGA) 10 MG TABS tablet Take 1 tablet (10 mg total) by mouth daily. 30 tablet 0   doxazosin (CARDURA) 4 MG tablet Take 4 mg by mouth daily.     finasteride (PROSCAR) 5 MG tablet Take 5 mg by mouth at bedtime.     gabapentin (NEURONTIN) 100 MG capsule Take 200 mg by mouth 3 (three) times daily.     ipratropium (ATROVENT) 0.03 % nasal spray Place 1 spray into both nostrils daily as needed (allergies).     magnesium oxide (MAG-OX) 400 (240 Mg) MG tablet Take 800 mg by mouth daily.     midodrine (PROAMATINE) 5 MG tablet Take 2 tablets (10 mg total) by mouth 3 (three) times daily with meals. 180 tablet 0   OXYGEN Inhale 2 L into the lungs as needed for shortness of breath.     pantoprazole (PROTONIX) 40 MG tablet Take 40 mg by mouth every morning.     potassium chloride (KLOR-CON) 10 MEQ tablet Take 2 tablets (20 mEq total) by mouth 2 (two) times daily. 240 tablet 3   Riociguat (ADEMPAS) 1.5 MG TABS Take 1.5 mg by mouth in the morning, at noon, and at bedtime. 90 tablet 0   Tiotropium Bromide  Monohydrate (SPIRIVA RESPIMAT) 1.25 MCG/ACT AERS Inhale 2 puffs into the lungs every evening.     torsemide (DEMADEX) 20 MG tablet Take 4 tablets (80 mg total) by mouth every morning AND 3 tablets (60 mg total) every evening. Take one extra tablet ( 20 mg ) for extra 3 lbs in one day.. 210 tablet 0    Blood pressure 94/64, pulse 77, temperature (!) 97.3 F (36.3 C), temperature source Oral, resp. rate 20, height _0  (1.905 m), weight 100.8 kg, SpO2 96 %. Physical Exam:  General: pleasant, WD, male who is sitting up in bed in NAD HEENT: head is normocephalic, atraumatic.  Pupils equal and round.  Ears and nose without any masses or  lesions.  Mouth is pink and moist Heart: regular, rate, and rhythm.  Normal s1,s2. No obvious murmurs, gallops, or rubs noted.  Palpable radial and pedal pulses bilaterally Lungs: CTAB, no wheezes, rhonchi, or rales noted.  Respiratory effort nonlabored Abd: soft, NT, ND, +BS, no masses, hernias, or organomegaly.  He begins to have hiccups at the time of my exam MS: all 4 extremities are symmetrical with no cyanosis, clubbing, or edema. Skin: warm and dry with no masses, lesions, or rashes Neuro: Cranial nerves 2-12 grossly intact, sensation is normal throughout Psych: A&Ox3 with an appropriate affect.   Results for orders placed or performed during the hospital encounter of 04/19/21 (from the past 48 hour(s))  Comprehensive metabolic panel     Status: Abnormal   Collection Time: 04/19/21  9:15 AM  Result Value Ref Range   Sodium 135 135 - 145 mmol/L   Potassium 5.5 (H) 3.5 - 5.1 mmol/L   Chloride 89 (L) 98 - 111 mmol/L   CO2 29 22 - 32 mmol/L   Glucose, Bld 117 (H) 70 - 99 mg/dL    Comment: Glucose reference range applies only to samples taken after fasting for at least 8 hours.   BUN 41 (H) 8 - 23 mg/dL   Creatinine, Ser 3.69 (H) 0.61 - 1.24 mg/dL   Calcium 9.8 8.9 - 10.3 mg/dL   Total Protein 6.8 6.5 - 8.1 g/dL   Albumin 3.1 (L) 3.5 - 5.0 g/dL   AST  416 (H) 15 - 41 U/L   ALT 197 (H) 0 - 44 U/L   Alkaline Phosphatase 107 38 - 126 U/L   Total Bilirubin 2.3 (H) 0.3 - 1.2 mg/dL   GFR, Estimated 16 (L) >60 mL/min    Comment: (NOTE) Calculated using the CKD-EPI Creatinine Equation (2021)    Anion gap 17 (H) 5 - 15    Comment: Performed at [facility], [place] [address], [place], [place] [address]  CBC with Differential     Status: Abnormal   Collection Time: 04/19/21  9:15 AM  Result Value Ref Range   WBC 12.1 (H) 4.0 - 10.5 K/uL   RBC 5.07 4.22 - 5.81 MIL/uL   Hemoglobin 14.7 13.0 - 17.0 g/dL   HCT 45.2 39.0 - 52.0 %   MCV 89.2 80.0 - 100.0 fL   MCH 29.0 26.0 - 34.0 pg   MCHC 32.5 30.0 - 36.0 g/dL   RDW 16.6 (H) 11.5 - 15.5 %   Platelets 117 (L) 150 - 400 K/uL    Comment: REPEATED TO VERIFY PLATELET COUNT CONFIRMED BY SMEAR    nRBC 0.0 0.0 - 0.2 %   Neutrophils Relative % 89 %   Neutro Abs 10.8 (H) 1.7 - 7.7 K/uL   Lymphocytes Relative 3 %   Lymphs Abs 0.4 (L) 0.7 - 4.0 K/uL   Monocytes Relative 7 %   Monocytes Absolute 0.9 0.1 - 1.0 K/uL   Eosinophils Relative 0 %   Eosinophils Absolute 0.0 0.0 - 0.5 K/uL   Basophils Relative 0 %   Basophils Absolute 0.0 0.0 - 0.1 K/uL   Immature Granulocytes 1 %   Abs Immature Granulocytes 0.07 0.00 - 0.07 K/uL    Comment: Performed at [facility], [place] [address] [place], [place] [address]  Lipase, blood     Status: None   Collection Time: 04/19/21  9:15 AM  Result Value Ref Range   Lipase 24 11 - 51 U/L    Comment:  Performed at [facility], [place] [address], [place], [place] [address]  Troponin I (High Sensitivity)     Status: Abnormal   Collection Time: 04/19/21  9:15 AM  Result Value Ref Range   Troponin I (High Sensitivity) 247 (HH) <18 ng/L    Comment: CRITICAL RESULT CALLED TO, READ BACK BY AND VERIFIED WITH: KELLY MOON RN 04/19/21 1119 M KOROLESKI (NOTE) Elevated high sensitivity troponin I (hsTnI) values and significant  changes across serial measurements  may suggest ACS but many other  chronic and acute conditions are known to elevate hsTnI results.  Refer to the Links section for chest pain algorithms and additional  guidance. Performed at [facility], [place] [address], [place], [place] [address]   Brain natriuretic peptide     Status: Abnormal   Collection Time: 04/19/21  9:15 AM  Result Value Ref Range   B Natriuretic Peptide 2,618.0 (H) 0.0 - 100.0 pg/mL    Comment: Performed at [facility] [address], [place], [place] [address]  Resp Panel by RT-PCR (Flu A&B, Covid) Nasopharyngeal Swab     Status: None   Collection Time: 04/19/21  9:34 AM   Specimen: Nasopharyngeal Swab; Nasopharyngeal(NP) swabs in vial transport medium  Result Value Ref Range   SARS Coronavirus 2 by RT PCR NEGATIVE NEGATIVE    Comment: (NOTE) SARS-CoV-2 target nucleic acids are NOT DETECTED.  The SARS-CoV-2 RNA is generally detectable in upper respiratory specimens during the acute phase of infection. The lowest concentration of SARS-CoV-2 viral copies this assay can detect is 138 copies/mL. A negative result does not preclude SARS-Cov-2 infection and should not be used as the sole basis for treatment or other patient management decisions. A negative result may occur with  improper specimen collection/handling, submission of specimen other than nasopharyngeal swab, presence of viral mutation(s) within the areas targeted by this assay, and inadequate number of viral copies(<138 copies/mL). A negative result must be combined with clinical observations, patient history, and epidemiological information. The expected result is Negative.  Fact Sheet for Patients:  EntrepreneurPulse.com.au  Fact Sheet for Healthcare Providers:  IncredibleEmployment.be  This test is no t yet approved or cleared by the [place] FDA and  has been authorized for detection and/or diagnosis of SARS-CoV-2 by FDA under an  Emergency Use Authorization (EUA). This EUA will remain  in effect (meaning this test can be used) for the duration of the COVID-19 declaration under Section 564(b)(1) of the Act, 21 U.S.C.section 360bbb-3(b)(1), unless the authorization is terminated  or revoked sooner.       Influenza A by PCR NEGATIVE NEGATIVE   Influenza B by PCR NEGATIVE NEGATIVE    Comment: (NOTE) The Xpert Xpress SARS-CoV-2/FLU/RSV plus assay is intended as an aid in the diagnosis of influenza from Nasopharyngeal swab specimens and should not be used as a sole basis for treatment. Nasal washings and aspirates are unacceptable for Xpert Xpress SARS-CoV-2/FLU/RSV testing.  Fact Sheet for Patients: EntrepreneurPulse.com.au  Fact Sheet for Healthcare Providers: IncredibleEmployment.be  This test is not yet approved or cleared by the [place] FDA and has been authorized for detection and/or diagnosis of SARS-CoV-2 by FDA under an Emergency Use Authorization (EUA). This EUA will remain in effect (meaning this test can be used) for the duration of the COVID-19 declaration under Section 564(b)(1) of the Act, 21 U.S.C. section 360bbb-3(b)(1), unless the authorization is terminated or revoked.  Performed at [facility], [place] [address] [place], [place]  [address]   Troponin I (High Sensitivity)     Status: Abnormal   Collection Time: 04/19/21 11:43 AM  Result Value Ref Range   Troponin I (High Sensitivity) 499 (HH) <18 ng/L    Comment: CRITICAL VALUE NOTED.  VALUE IS CONSISTENT WITH PREVIOUSLY REPORTED AND CALLED VALUE. (NOTE) Elevated high sensitivity troponin I (hsTnI) values and significant  changes across serial measurements may suggest ACS but many other  chronic and acute conditions are known to elevate hsTnI results.  Refer to the Links section for chest pain algorithms and additional  guidance. Performed at [facility], [place] [address],  [place], [place] [address]   Basic metabolic panel     Status: Abnormal   Collection Time: 04/19/21 12:55 PM  Result Value Ref Range   Sodium 134 (L) 135 - 145 mmol/L   Potassium 6.2 (H) 3.5 - 5.1 mmol/L   Chloride 89 (L) 98 - 111 mmol/L   CO2 34 (H) 22 - 32 mmol/L   Glucose, Bld 103 (H) 70 - 99 mg/dL    Comment: Glucose reference range applies only to samples taken after fasting for at least 8 hours.   BUN 44 (H) 8 - 23 mg/dL   Creatinine, Ser 3.74 (H) 0.61 - 1.24 mg/dL   Calcium 9.5 8.9 - 10.3 mg/dL   GFR, Estimated 15 (L) >60 mL/min    Comment: (NOTE) Calculated using the CKD-EPI Creatinine Equation (2021)    Anion gap 11 5 - 15    Comment: Performed at [facility] [address] [place], [place] [address]  CBG monitoring, ED     Status: None   Collection Time: 04/19/21  3:06 PM  Result Value Ref Range   Glucose-Capillary 92 70 - 99 mg/dL    Comment: Glucose reference range applies only to samples taken after fasting for at least 8 hours.   Comment 1 Notify RN    Comment 2 Document in Chart   Troponin I (High Sensitivity)     Status: Abnormal   Collection Time: 04/19/21  3:48 PM  Result Value Ref Range   Troponin I (High Sensitivity) 717 (HH) <18 ng/L    Comment: CRITICAL VALUE NOTED.  VALUE IS CONSISTENT WITH PREVIOUSLY REPORTED AND CALLED VALUE. (NOTE) Elevated high sensitivity troponin I (hsTnI) values and significant  changes across serial measurements may suggest ACS but many other  chronic and acute conditions are known to elevate hsTnI results.  Refer to the Links section for chest pain algorithms and additional  guidance. Performed at [facility], [place] [address] [place], [place] [address]   Basic metabolic panel     Status: Abnormal   Collection Time: 04/19/21  5:13 PM  Result Value Ref Range   Sodium 133 (L) 135 - 145 mmol/L   Potassium 4.8 3.5 - 5.1 mmol/L    Comment: DELTA CHECK NOTED   Chloride 90 (L) 98 - 111 mmol/L   CO2 34 (H) 22 - 32  mmol/L   Glucose, Bld 71 70 - 99 mg/dL    Comment: Glucose reference range applies only to samples taken after fasting for at least 8 hours.   BUN 45 (H) 8 - 23 mg/dL   Creatinine, Ser 3.87 (H) 0.61 - 1.24 mg/dL   Calcium 9.5 8.9 - 10.3 mg/dL   GFR, Estimated 15 (L) >60 mL/min    Comment: (NOTE) Calculated using the CKD-EPI Creatinine Equation (2021)    Anion gap 9 5 - 15  Comment: Performed at [facility], [place] [address], [place], [place] [address]  Troponin I (High Sensitivity)     Status: Abnormal   Collection Time: 04/19/21  5:13 PM  Result Value Ref Range   Troponin I (High Sensitivity) 893 (HH) <18 ng/L    Comment: CRITICAL VALUE NOTED.  VALUE IS CONSISTENT WITH PREVIOUSLY REPORTED AND CALLED VALUE. (NOTE) Elevated high sensitivity troponin I (hsTnI) values and significant  changes across serial measurements may suggest ACS but many other  chronic and acute conditions are known to elevate hsTnI results.  Refer to the Links section for chest pain algorithms and additional  guidance. Performed at [facility], [place] [address] [place], [place] [address]   Magnesium     Status: None   Collection Time: 04/19/21  5:18 PM  Result Value Ref Range   Magnesium 2.1 1.7 - 2.4 mg/dL    Comment: Performed at [facility], [place] [address], [place], [place] [address]  Hepatitis panel, acute     Status: None   Collection Time: 04/19/21  6:50 PM  Result Value Ref Range   Hepatitis B Surface Ag NON REACTIVE NON REACTIVE   HCV Ab NON REACTIVE NON REACTIVE    Comment: (NOTE) Nonreactive HCV antibody screen is consistent with no HCV infections,  unless recent infection is suspected or other evidence exists to indicate HCV infection.     Hep A IgM NON REACTIVE NON REACTIVE   Hep B C IgM NON REACTIVE NON REACTIVE    Comment: Performed at [facility], [place] [address] [place], [place] [address]  CBC     Status: Abnormal   Collection Time: 04/20/21  1:19 AM   Result Value Ref Range   WBC 8.8 4.0 - 10.5 K/uL   RBC 4.89 4.22 - 5.81 MIL/uL   Hemoglobin 13.8 13.0 - 17.0 g/dL   HCT 42.7 39.0 - 52.0 %   MCV 87.3 80.0 - 100.0 fL   MCH 28.2 26.0 - 34.0 pg   MCHC 32.3 30.0 - 36.0 g/dL   RDW 16.0 (H) 11.5 - 15.5 %   Platelets 112 (L) 150 - 400 K/uL    Comment: CONSISTENT WITH PREVIOUS RESULT REPEATED TO VERIFY    nRBC 0.0 0.0 - 0.2 %    Comment: Performed at [facility], [place] [address], [place], [place] [address]  Comprehensive metabolic panel     Status: Abnormal   Collection Time: 04/20/21  1:19 AM  Result Value Ref Range   Sodium 134 (L) 135 - 145 mmol/L   Potassium 4.6 3.5 - 5.1 mmol/L   Chloride 89 (L) 98 - 111 mmol/L   CO2 33 (H) 22 - 32 mmol/L   Glucose, Bld 78 70 - 99 mg/dL    Comment: Glucose reference range applies only to samples taken after fasting for at least 8 hours.   BUN 50 (H) 8 - 23 mg/dL   Creatinine, Ser 3.70 (H) 0.61 - 1.24 mg/dL   Calcium 9.4 8.9 - 10.3 mg/dL   Total Protein 6.2 (L) 6.5 - 8.1 g/dL   Albumin 2.8 (L) 3.5 - 5.0 g/dL   AST 1,475 (H) 15 - 41 U/L   ALT 879 (H) 0 - 44 U/L   Alkaline Phosphatase 95 38 - 126 U/L   Total Bilirubin 1.8 (H) 0.3 - 1.2 mg/dL   GFR, Estimated 16 (L) >60 mL/min    Comment: (NOTE) Calculated using the CKD-EPI Creatinine Equation (  2021)    Anion gap 12 5 - 15    Comment: Performed at [facility], [place] [address], [place], [place] [address]  Urinalysis, Routine w reflex microscopic     Status: Abnormal   Collection Time: 04/20/21  1:58 AM  Result Value Ref Range   Color, Urine YELLOW YELLOW   APPearance CLEAR CLEAR   Specific Gravity, Urine 1.010 1.005 - 1.030   pH 5.0 5.0 - 8.0   Glucose, UA NEGATIVE NEGATIVE mg/dL   Hgb urine dipstick MODERATE (A) NEGATIVE   Bilirubin Urine NEGATIVE NEGATIVE   Ketones, ur NEGATIVE NEGATIVE mg/dL   Protein, ur 30 (A) NEGATIVE mg/dL   Nitrite NEGATIVE NEGATIVE   Leukocytes,Ua NEGATIVE NEGATIVE   RBC / HPF 0-5 0 - 5 RBC/hpf    WBC, UA 0-5 0 - 5 WBC/hpf   Bacteria, UA RARE (A) NONE SEEN   Squamous Epithelial / LPF 0-5 0 - 5   Mucus PRESENT    Hyaline Casts, UA PRESENT     Comment: Performed at [facility] [address] [place], [place] [address]  Sodium, urine, random     Status: None   Collection Time: 04/20/21  2:05 AM  Result Value Ref Range   Sodium, Ur 27 mmol/L    Comment: Performed at [facility] [address], [place], [place] [address]  Protein / creatinine ratio, urine     Status: Abnormal   Collection Time: 04/20/21  2:05 AM  Result Value Ref Range   Creatinine, Urine 93.81 mg/dL   Total Protein, Urine 54 mg/dL    Comment: NO NORMAL RANGE ESTABLISHED FOR THIS TEST   Protein Creatinine Ratio 0.58 (H) 0.00 - 0.15 mg/mg[Cre]    Comment: Performed at [facility] [address], [place],  [address]   CT ABDOMEN PELVIS WO CONTRAST  Result Date: 04/19/2021 CLINICAL DATA:  Concern for bowel obstruction, vomiting for 2 days EXAM: CT ABDOMEN AND PELVIS WITHOUT CONTRAST TECHNIQUE: Multidetector CT imaging of the abdomen and pelvis was performed following the standard protocol without IV contrast. COMPARISON:  None. FINDINGS: Lower chest: There is a trace right pleural effusion with bibasilar subsegmental atelectasis. The heart is enlarged. Coronary artery calcifications are noted. There is trace pericardial fluid. Hepatobiliary: The liver is within normal limits, within the confines of noncontrast technique. Pancreas: The pancreas is atrophic but otherwise unremarkable. There is no focal lesion or contour abnormality. There is no main pancreatic ductal dilatation or peripancreatic inflammatory change. Spleen: Unremarkable. Adrenals/Urinary Tract: The adrenals are unremarkable. There is a 2.2 cm left lower pole renal cyst. There are no other focal lesions. There are no stones. There is no hydronephrosis or hydroureter. The bladder wall is thickened, likely reflecting chronic outlet  obstruction. Stomach/Bowel: There is a moderate hiatal hernia. The stomach is otherwise unremarkable. There is dilation of the third portion of the duodenum with marked narrowing as the duodenum passes between the aneurysmal aorta and the SMV. Otherwise, there is no evidence of bowel obstruction. There is no evidence of bowel wall thickening or inflammatory change. The appendix is normal. There are scattered colonic diverticula without evidence of acute diverticulitis. Vascular/Lymphatic: There is extensive calcified atherosclerotic plaque throughout the arterial vasculature. The infrarenal abdominal aorta is aneurysmal measuring up to 3.5 cm. There is no evidence of rupture. There is no abdominal or pelvic lymphadenopathy. Reproductive: The prostate is enlarged measuring up to 6.4 cm and impresses upon the inferior aspect of the  bladder. Other: There is scattered fluid in the abdomen measuring greater than simple fluid attenuation, predominantly around the liver tip and spleen. There is no free intraperitoneal air. There is a small umbilical hernia containing a loop of unobstructed small bowel. Musculoskeletal: There is mild multilevel degenerative change of the spine. There is no acute osseous abnormality or aggressive osseous lesion. IMPRESSION: 1. Abrupt narrowing of the third portion of the duodenum as it passes between the aneurysmal abdominal aorta and SMV with upstream dilation is suggestive of nutcracker phenomenon which may account for the patient's symptoms. No evidence of high-grade bowel obstruction. 2. Scattered ascites throughout the abdomen measuring greater than simple fluid attenuation. This may reflect proteinaceous debris; however, hemoperitoneum is not excluded. No source of bleeding is identified. 3. Infrarenal abdominal aortic aneurysm measuring up to 3.5 cm without evidence of rupture. Recommend follow-up ultrasound every 2 years. This recommendation follows ACR consensus guidelines: White  Paper of the ACR Incidental Findings Committee II on Vascular Findings. J Am Coll Radiol 2013; 10:789-794. 4. Small ventral abdominal hernia containing a loop of nonobstructed small bowel. 5. Moderate-sized hiatal hernia. 6. Thickened bladder wall likely reflecting chronic outlet obstruction from the enlarged prostate. 7. Cardiomegaly. Electronically Signed   By: Valetta Mole M.D.   On: 04/19/2021 14:24   US RENAL  Result Date: 04/19/2021 CLINICAL DATA:  Acute kidney injury EXAM: RENAL / URINARY TRACT ULTRASOUND COMPLETE COMPARISON:  CT 04/19/2021 FINDINGS: Right Kidney: Renal measurements: 10.4 x 5.4 x 5.3 cm = volume: 155 mL. Echogenicity within normal limits. No mass, shadowing stone, or hydronephrosis visualized. Left Kidney: Renal measurements: 10.9 x 5.5 x 5.9 cm = volume: 185 mL. Echogenicity within normal limits. 2.8 cm cyst within the interpolar region of the left kidney. No shadowing stone or hydronephrosis visualized. Bladder: Diffuse urinary bladder wall thickening. Other: Prostatomegaly. IMPRESSION: 1. No renal stone or hydronephrosis. 2. Prostatomegaly with diffuse urinary bladder wall thickening, which may be related to chronic outlet obstruction. Electronically Signed   By: Davina Poke D.O.   On: 04/19/2021 17:40   DG Chest Portable 1 View  Result Date: 04/19/2021 CLINICAL DATA:  Vomiting.  Evaluate for aspiration EXAM: PORTABLE CHEST 1 VIEW COMPARISON:  04/12/2021 FINDINGS: New chronic cardiomegaly and vascular pedicle widening. Interstitial coarsening at the bases which is similar to prior. No focal infiltrate, effusion, or air leak. IMPRESSION: 1. No focal opacity to confirm aspiration. 2. Increased interstitial coarsening which could be from portable technique or mild edema. Electronically Signed   By: Monte Fantasia M.D.   On: 04/19/2021 10:02   US Abdomen Limited RUQ (LIVER/GB)  Result Date: 04/19/2021 CLINICAL DATA:  Transaminitis EXAM: ULTRASOUND ABDOMEN LIMITED RIGHT UPPER  QUADRANT COMPARISON:  CT 04/19/2021 FINDINGS: Gallbladder: Surgically absent Common bile duct: Diameter: 4.3 mm Liver: No focal lesion identified. Within normal limits in parenchymal echogenicity. Portal vein is patent on color Doppler imaging with normal direction of blood flow towards the liver. Other: Trace ascites adjacent to the liver IMPRESSION: Status post cholecystectomy.  Small amount of perihepatic ascites Electronically Signed   By: Donavan Foil M.D.   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  No evidence of high-grade bowel obstruction. He has had nausea/emesis/hiccups prior and work-up by GI, Dr. Benson Norway, with EGD in March of this  year. Of note, he had resolution of symptoms for some time with gabapentin. Discussed with Dr. Benson Norway and no recommendation for further GI work up  at this time. Nausea/emesis/hiccups could certainly be exacerbated by his heart failure. Symptoms may have obstructive component from duodenal narrowing but unfortunately no intervention options at this time from GI standpoint Additionally, patient is not a good surgical candidate given his significant comorbidities and no indication for acute surgical intervention at this time. Patient states he would not want surgical intervention even if offered. Continue gabapentin and antiemetics.  Consider NG tube placement if worsening nausea, emesis, abdominal pain   FEN: trial clears, IVF per primary QI:ONGE needed from our standpoint VTE: eliquis  Per primary: Chronic heart failure AKI on stage III CKD Hyperkalemia Pulmonary hypertension OSA, not on CPAP Chronic PE -on Eliquis BPH with urinary retention -Flomax, self cath at [place], [facility] Surgery 04/20/2021, 10:02 AM Please see Amion for pager number during day hours 7:00am-4:30pm

## 2021-04-20 NOTE — Progress Notes (Signed)
Telemetry box MX40-03 removed from patient. Wall monitor applied. CCMD notified. Telemetry box placed in cubby box. Battery placed on charger.  Daymon Larsen, RN

## 2021-04-20 NOTE — Progress Notes (Signed)
Procalcitonin came back elevated at 3.3. Discussed with Dr. Aundra Dubin.   Will defer need for antibiotics to hospitalist team.

## 2021-04-20 NOTE — TOC Initial Note (Signed)
Transition of Care [facility]) - Initial/Assessment Note    Patient Details  Name: Joshua [name] MRN: [id number] Date of Birth: [birth year]  [facility] CM/SW Contact:    Erenest Rasher, RN Phone Number: [phone] 04/20/2021, 4:44 PM  Clinical Narrative:                 HF TOC CM contacted [facility] rep, Ramond Marrow. They will follow up with a 24-48 hour start of care after discharged. Explained pt is a readmission and need close follow up at home. Pt has needed DME and wife to assist him at home. Will need HH RN and PT order with [facility], with resumption of care at time of dc.   Expected Discharge Plan: [place] Barriers to Discharge: Continued Medical Work up   Patient Goals and CMS Choice Patient states their goals for this hospitalization and ongoing recovery are:: wants to return back to feel better CMS Medicare.gov Compare Post Acute Care list provided to:: Patient Choice offered to / list presented to : Patient  Expected Discharge Plan and Services Expected Discharge Plan: [place] In-house Referral: Clinical Social Work Discharge Planning Services: CM Consult Post Acute Care Choice: [place] arrangements for the past 2 months: Single Family Home                           HH Arranged: RN, PT [facility] Agency: [place] ([facility]) Date HH Agency Contacted: 04/20/21 Time [place]: 1642 Representative spoke with at [place]: Wylene Men  Prior Living Arrangements/Services Living arrangements for the past 2 months: [place] with:: Spouse   Do you feel safe going back to the place where you live?: Yes      Need for Family Participation in Patient Care: Yes (Comment) Care giver support system in place?: Yes (comment) Current home services: DME, Home RN, Home PT (rolling walker, cane, oxygen ([facility]))    Activities of Daily Living      Permission  Sought/Granted Permission sought to share information with : Case Manager, PCP, Family Supports Permission granted to share information with : Yes, Verbal Permission Granted  Share Information with NAME: Katherine Syme  Permission granted to share info w AGENCY: [place] granted to share info w Relationship: wife  Permission granted to share info w Contact Information: [phone]  Emotional Assessment Appearance:: Appears stated age Attitude/Demeanor/Rapport: Gracious, Engaged Affect (typically observed): Accepting Orientation: : Oriented to Self, Oriented to Place, Oriented to  Time, Oriented to Situation   Psych Involvement: No (comment)  Admission diagnosis:  AKI (acute kidney injury) ([place]) [N17.9] Acute renal failure superimposed on stage 3 chronic kidney disease ([place]) [N17.9, N18.30] Patient Active Problem List   Diagnosis Date Noted   Acute renal failure superimposed on stage 3 chronic kidney disease ([place]) 04/19/2021   Intractable nausea and vomiting 04/19/2021   Hyperkalemia 04/19/2021   Transaminitis 04/19/2021   Elevated troponin 04/19/2021   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   Acute on chronic diastolic (congestive) heart failure ([place]) 04/12/2021   Acute on chronic right-sided heart failure ([place]) 04/12/2021   Acute kidney injury superimposed on CKD ([place]) 04/12/2021   OSA (obstructive sleep apnea) 12/07/2020   Aortic atherosclerosis ([place]) 12/07/2020   CHF (congestive heart failure) ([place]) 12/03/2020   Syncope and collapse 12/03/2020   Chronic right-sided heart failure ([place]) 12/03/2020   Pulmonary HTN ([place]) 09/29/2017  BPH with urinary obstruction 08/25/2017   Syncope, vasovagal 08/25/2017   Demand ischemia of myocardium ([place]) 87/86/7672   Diastolic CHF ([place]) 09/47/0962   Essential hypertension 02/16/2015   COPD (chronic obstructive pulmonary disease) ([place]) 12/12/2014   History of pulmonary embolism 10/10/2014   Chronic respiratory failure with hypoxia ([place])  10/10/2014   S/P knee replacement 09/13/2014   PCP:  [name], DO Pharmacy:   [id number] [place] [address]: [phone]: [address] [place]Phone: [phone]: [address] [place]Phone: [phone]: [phone]    Social Determinants of Health (SDOH) Interventions    Readmission Risk Interventions No flowsheet data found.

## 2021-04-20 NOTE — Progress Notes (Addendum)
Advanced Heart Failure Rounding Note  PCP-Cardiologist: Sherren Mocha, MD   Subjective:   Patient admitted 09/01 with N, V, hypotension and AKI on CKD after recent admission for a/c diastolic HF/RV failure. Gave IV fluids and Midodrine increased to 15 mg TID to support BP.  Still with intermittent nausea. No recurrent vomiting. Denies abdominal pain. No dyspnea.  BP improving, now 40G systolic.  Creatinine remains around 3.7.  LFTs trending up.  CT abdomen pelvis yesterday with abrupt narrowing third portion of duodenum as it passes between abdominal aorta and SMV with upstream dilatation suggestive of nutcracker phenomenon     Objective:   Weight Range: 100.8 kg Body mass index is 27.78 kg/m.   Vital Signs:   Temp:  [97.3 F (36.3 C)-99.1 F (37.3 C)] 97.3 F (36.3 C) (09/02 0830) Pulse Rate:  [60-91] 77 (09/02 0830) Resp:  [11-20] 20 (09/02 0830) BP: (61-127)/(41-97) 94/64 (09/02 0830) SpO2:  [88 %-100 %] 96 % (09/02 0830) Weight:  [98.3 kg-100.8 kg] 100.8 kg (09/02 0417) Last BM Date: 04/18/21  Weight change: Filed Weights   04/19/21 1710 04/19/21 1723 04/20/21 0417  Weight: 98.3 kg 98.3 kg 100.8 kg    Intake/Output:   Intake/Output Summary (Last 24 hours) at 04/20/2021 0836 Last data filed at 04/20/2021 0148 Gross per 24 hour  Intake 500 ml  Output 800 ml  Net -300 ml      Physical Exam    General:  Chronically ill appearing AAM in no acute distress. No resp difficulty, on 3L02 [place] HEENT: Normal Neck: Supple. JVP not elevated. Carotids 2+ bilat; no bruits. No lymphadenopathy or thyromegaly appreciated. Cor: PMI nondisplaced. Regular rate & rhythm. No rubs, gallops or murmurs. Lungs: Clear Abdomen: Soft, nontender, nondistended. No hepatosplenomegaly. No bruits or masses. Good bowel sounds. Extremities: No cyanosis, clubbing, rash, edema Neuro: Alert & orientedx3, cranial nerves grossly intact. moves all 4 extremities w/o difficulty. Affect  pleasant   Telemetry   NSR 70s (personally reviewed)  EKG    No new  Labs    CBC Recent Labs    04/19/21 0915 04/20/21 0119  WBC 12.1* 8.8  NEUTROABS 10.8*  --   HGB 14.7 13.8  HCT 45.2 42.7  MCV 89.2 87.3  PLT 117* 867*   Basic Metabolic Panel Recent Labs    04/19/21 1713 04/19/21 1718 04/20/21 0119  NA 133*  --  134*  K 4.8  --  4.6  CL 90*  --  89*  CO2 34*  --  33*  GLUCOSE 71  --  78  BUN 45*  --  50*  CREATININE 3.87*  --  3.70*  CALCIUM 9.5  --  9.4  MG  --  2.1  --    Liver Function Tests Recent Labs    04/19/21 0915 04/20/21 0119  AST 416* 1,475*  ALT 197* 879*  ALKPHOS 107 95  BILITOT 2.3* 1.8*  PROT 6.8 6.2*  ALBUMIN 3.1* 2.8*   Recent Labs    04/19/21 0915  LIPASE 24   Cardiac Enzymes No results for input(s): CKTOTAL, CKMB, CKMBINDEX, TROPONINI in the last 72 hours.  BNP: BNP (last 3 results) Recent Labs    04/11/21 1047 04/12/21 1435 04/19/21 0915  BNP 1,737.2* 1,597.1* 2,618.0*    ProBNP (last 3 results) No results for input(s): PROBNP in the last 8760 hours.   D-Dimer No results for input(s): DDIMER in the last 72 hours. Hemoglobin A1C No results for input(s): HGBA1C in the last 72  hours. Fasting Lipid Panel No results for input(s): CHOL, HDL, LDLCALC, TRIG, CHOLHDL, LDLDIRECT in the last 72 hours. Thyroid Function Tests No results for input(s): TSH, T4TOTAL, T3FREE, THYROIDAB in the last 72 hours.  Invalid input(s): FREET3  Other results:   Imaging    CT ABDOMEN PELVIS WO CONTRAST  Result Date: 04/19/2021 CLINICAL DATA:  Concern for bowel obstruction, vomiting for 2 days EXAM: CT ABDOMEN AND PELVIS WITHOUT CONTRAST TECHNIQUE: Multidetector CT imaging of the abdomen and pelvis was performed following the standard protocol without IV contrast. COMPARISON:  None. FINDINGS: Lower chest: There is a trace right pleural effusion with bibasilar subsegmental atelectasis. The heart is enlarged. Coronary artery  calcifications are noted. There is trace pericardial fluid. Hepatobiliary: The liver is within normal limits, within the confines of noncontrast technique. Pancreas: The pancreas is atrophic but otherwise unremarkable. There is no focal lesion or contour abnormality. There is no main pancreatic ductal dilatation or peripancreatic inflammatory change. Spleen: Unremarkable. Adrenals/Urinary Tract: The adrenals are unremarkable. There is a 2.2 cm left lower pole renal cyst. There are no other focal lesions. There are no stones. There is no hydronephrosis or hydroureter. The bladder wall is thickened, likely reflecting chronic outlet obstruction. Stomach/Bowel: There is a moderate hiatal hernia. The stomach is otherwise unremarkable. There is dilation of the third portion of the duodenum with marked narrowing as the duodenum passes between the aneurysmal aorta and the SMV. Otherwise, there is no evidence of bowel obstruction. There is no evidence of bowel wall thickening or inflammatory change. The appendix is normal. There are scattered colonic diverticula without evidence of acute diverticulitis. Vascular/Lymphatic: There is extensive calcified atherosclerotic plaque throughout the arterial vasculature. The infrarenal abdominal aorta is aneurysmal measuring up to 3.5 cm. There is no evidence of rupture. There is no abdominal or pelvic lymphadenopathy. Reproductive: The prostate is enlarged measuring up to 6.4 cm and impresses upon the inferior aspect of the bladder. Other: There is scattered fluid in the abdomen measuring greater than simple fluid attenuation, predominantly around the liver tip and spleen. There is no free intraperitoneal air. There is a small umbilical hernia containing a loop of unobstructed small bowel. Musculoskeletal: There is mild multilevel degenerative change of the spine. There is no acute osseous abnormality or aggressive osseous lesion. IMPRESSION: 1. Abrupt narrowing of the third portion  of the duodenum as it passes between the aneurysmal abdominal aorta and SMV with upstream dilation is suggestive of nutcracker phenomenon which may account for the patient's symptoms. No evidence of high-grade bowel obstruction. 2. Scattered ascites throughout the abdomen measuring greater than simple fluid attenuation. This may reflect proteinaceous debris; however, hemoperitoneum is not excluded. No source of bleeding is identified. 3. Infrarenal abdominal aortic aneurysm measuring up to 3.5 cm without evidence of rupture. Recommend follow-up ultrasound every 2 years. This recommendation follows ACR consensus guidelines: White Paper of the ACR Incidental Findings Committee II on Vascular Findings. J Am Coll Radiol 2013; 10:789-794. 4. Small ventral abdominal hernia containing a loop of nonobstructed small bowel. 5. Moderate-sized hiatal hernia. 6. Thickened bladder wall likely reflecting chronic outlet obstruction from the enlarged prostate. 7. Cardiomegaly. Electronically Signed   By: Valetta Mole M.D.   On: 04/19/2021 14:24   US RENAL  Result Date: 04/19/2021 CLINICAL DATA:  Acute kidney injury EXAM: RENAL / URINARY TRACT ULTRASOUND COMPLETE COMPARISON:  CT 04/19/2021 FINDINGS: Right Kidney: Renal measurements: 10.4 x 5.4 x 5.3 cm = volume: 155 mL. Echogenicity within normal limits. No mass, shadowing stone,  or hydronephrosis visualized. Left Kidney: Renal measurements: 10.9 x 5.5 x 5.9 cm = volume: 185 mL. Echogenicity within normal limits. 2.8 cm cyst within the interpolar region of the left kidney. No shadowing stone or hydronephrosis visualized. Bladder: Diffuse urinary bladder wall thickening. Other: Prostatomegaly. IMPRESSION: 1. No renal stone or hydronephrosis. 2. Prostatomegaly with diffuse urinary bladder wall thickening, which may be related to chronic outlet obstruction. Electronically Signed   By: Davina Poke D.O.   On: 04/19/2021 17:40   DG Chest Portable 1 View  Result Date:  04/19/2021 CLINICAL DATA:  Vomiting.  Evaluate for aspiration EXAM: PORTABLE CHEST 1 VIEW COMPARISON:  04/12/2021 FINDINGS: New chronic cardiomegaly and vascular pedicle widening. Interstitial coarsening at the bases which is similar to prior. No focal infiltrate, effusion, or air leak. IMPRESSION: 1. No focal opacity to confirm aspiration. 2. Increased interstitial coarsening which could be from portable technique or mild edema. Electronically Signed   By: Monte Fantasia M.D.   On: 04/19/2021 10:02   US Abdomen Limited RUQ (LIVER/GB)  Result Date: 04/19/2021 CLINICAL DATA:  Transaminitis EXAM: ULTRASOUND ABDOMEN LIMITED RIGHT UPPER QUADRANT COMPARISON:  CT 04/19/2021 FINDINGS: Gallbladder: Surgically absent Common bile duct: Diameter: 4.3 mm Liver: No focal lesion identified. Within normal limits in parenchymal echogenicity. Portal vein is patent on color Doppler imaging with normal direction of blood flow towards the liver. Other: Trace ascites adjacent to the liver IMPRESSION: Status post cholecystectomy.  Small amount of perihepatic ascites Electronically Signed   By: Donavan Foil M.D.   On: 04/19/2021 22:48     Medications:     Scheduled Medications:  allopurinol  100 mg Oral q morning   apixaban  5 mg Oral BID   finasteride  5 mg Oral QHS   gabapentin  100 mg Oral TID   midodrine  15 mg Oral TID WC   pantoprazole  40 mg Oral q morning   Riociguat  1.5 mg Oral BID   sodium chloride flush  3 mL Intravenous Q12H   umeclidinium bromide  1 puff Inhalation QPM    Infusions:  sodium chloride     sodium chloride      PRN Medications: sodium chloride, albuterol, ipratropium, ondansetron **OR** ondansetron (ZOFRAN) IV, sodium chloride flush   Assessment/Plan  1. Chronic HFpEF/RV failure: Echo (2/22) with EF 55-60%, IV septum flattened, severe RV enlargement, severely decreased RV function, PASP 57 mmHg.  RHC in 4/22 showed normal filling pressures. Admitted for a/c RV failure w/ markedly  volume overload + poor response to home diuretics.  -Recent admit with a/c HF. Diuresed 20 lb. D/C on torsemide 80 mg am/ 60 mg pm. [place] added but never started it.  - Now presenting with N, V, fatigue and AKI on CKD. Appeared volume depleted. Scr up from 1.5 to 3.69. Given 500 cc NS.  - Holding diuretics. Appears compensated.  - Midodrine increased to 15 mg TID to support BP - TED hose 2. AKI on Stage III CKD: Baseline SCr ~1.6. Scr 1.47 at d/c on 08/30. Up to 3.69 on admit, 3.70 this am - Hypotensive with SBP down to 60s while in ED - ? Cardiorenal component or obstructive? Has BPH and self caths intermittently. No hydronephrosis on renal US. - Also with poor po intake and vomiting last few days - IV fluids given in ED - Hold diuretics - continue midodrine 15 mg TID for BP support. BP better today 95K-DTO 671I systolic. 45Y-09X yesterday and during recent admit. - Monitor closely 3.  Hyperkalemia: - Resolved.  - D/C home K supplement 4. Pulmonary HTN: PAH noted on 4/22 RHC with PVR 6.1 WU.  This appears to be multifactorial with OSA, emphysema, hypoxemia with ambulation, and a suspected chronic PE involving the right middle lobe (group 3 and group 4 PH). Given the suspected mixed etiology with only 1 area of chronic thromboembolism (right middle lobe) as well as age, do not think that pulmonary thromboendarterectomy would be indicated.  Rheumatologic serologic workup was negative.  PFTs showed severe obstruction and moderate restriction, suggesting significant COPD. - Continue riociguat for CTEPH, 1.5 mg bid.  - 02 sats stable on 3L 02, on chronic 02 at home 5. Elevated troponin: - HS trop 247 > 499 > 717 > 893 - Denies CP  - Likely d/t demand ischemia from acute illness 6. Elevated LFTs - WNL on 08/25, now trending upwards.  - AST 416 > 1475 - ALT 197 > 879 - No significant abnormality identified on [place]. Small amount of perihepatic ascites - ? How much RV failure/hypotension  contributing or viral GI illness given episodes of N/V. Will check procalcitonin, however; he is afebrile 7. Duodenal abnormality: -CT abdomen yesterday with narrowing distal third duodenum with upstream dilatation.  - ? If contributing to N, V -Surgery consulted by [facility].  8. OSA: untreated. Unable to tolerate CPAP  9. Emphysema: Prior smoker.  Emphysema on CT and severe obstruction on PFTs - He uses home oxygen  10. Orthostatic Hypotension  - c/w midodrine + leg compression + fluids 11. Chronic PE - on Eliquis 12. BPH with urinary retention: He has had issues with this in the past, often self caths 3-4x/day.  - On Flomax.  - Had outpatient f/u scheduled but missed d/t admission 13. Leukocytosis: - WBC 12.1>8.8 - AF - Straight cath's and had foley during recent admit. UA pending  14. Thrombocytopenia: - Platelets 161 on 08/30, now 117>112 - Trend  Length of Stay: [place], LINDSAY N, PA-C  04/20/2021, 8:36 AM  Advanced Heart Failure Team Pager [phone] (M-F; 7a - 5p)  Please contact [facility] for night-coverage after hours (5p -7a ) and weekends on [email or web address]   Patient seen with PA, agree with the above note.   He still has some nausea this morning, no emesis.  No abdominal pain.  Had Zofran today.   Abdominal US concerning for severe narrowing of 3rd portion of duodenum between 3.5 cm infrarenal aortic aneurysm and SMV.  Seen by surgery, not suggestive for high grade bowel obstruction and would manage conservatively.  LFTs rising, RUQ US unremarkable (does not have gallbladder).    Creatinine remains elevated at 3.7.    Afebrile, WBCs normal.   BP improved, SBP 90s-100s. Getting NS 75 cc/hr while NPO.   General: NAD Neck: JVP 12-13 cm, no thyromegaly or thyroid nodule.  Lungs: Clear to auscultation bilaterally with normal respiratory effort. CV: Nondisplaced PMI.  Heart regular S1/S2, no S3/S4, no murmur.  No peripheral edema.   Abdomen: Soft, nontender, no  hepatosplenomegaly, no distention.  Skin: Intact without lesions or rashes.  Neurologic: Alert and oriented x 3.  Psych: Normal affect. Extremities: No clubbing or cyanosis.  HEENT: Normal.   Still unsure about unifying cause for current presentation.  Suspect nausea/vomiting and poor po intake at home led to worsening hypotension with rise in LFTs and AKI.  Cause of GI symptoms still somewhat unclear => could be RV failure but he had been well-diuresed before last discharge.  I do  not think the abdominal US or the CT abdomen/pelvis show a definite cause (the "nutcracker syndrome" is unlikely to be an acute issue and there are not signs of high grade bowel obstruction). He had not started [place] at home.   - For now, would hold diuretics and gently hydrate while NPO (written for NS 75 cc/hr).  Will need to avoid over-hydration given severe RV dysfunction/cor pulmonale.  - Midodrine increased to 15 mg tid.  - Follow creatinine and LFTs, should begin to see improvement now that SBP in 90s-100s (this is good for him).  - Check PCT and send blood cultures, though so far no definite evidence for infection.   At baseline, patient has cor pulmonale with severe pulmonary hypertension.  The PH is likely primarily group 3 from COPD and OSA though there is likely a component from CTEPH with chronic RML thrombus.  With stable BP, continue Adempas.  Will need to follow closely to make sure that he does not develop significant volume overload.  CVP will run mildly elevated at baseline due to RV failure.   Loralie Champagne 04/20/2021 11:40 AM

## 2021-04-21 DIAGNOSIS — R112 Nausea with vomiting, unspecified: Secondary | ICD-10-CM | POA: Diagnosis not present

## 2021-04-21 LAB — BASIC METABOLIC PANEL
Anion gap: 8 (ref 5–15)
BUN: 49 mg/dL — ABNORMAL HIGH (ref 8–23)
CO2: 31 mmol/L (ref 22–32)
Calcium: 8.7 mg/dL — ABNORMAL LOW (ref 8.9–10.3)
Chloride: 95 mmol/L — ABNORMAL LOW (ref 98–111)
Creatinine, Ser: 2.84 mg/dL — ABNORMAL HIGH (ref 0.61–1.24)
GFR, Estimated: 21 mL/min — ABNORMAL LOW (ref >60)
Glucose, Bld: 71 mg/dL (ref 70–99)
Potassium: 3.8 mmol/L (ref 3.5–5.1)
Sodium: 134 mmol/L — ABNORMAL LOW (ref 135–145)

## 2021-04-21 LAB — HEPATIC FUNCTION PANEL
ALT: 707 U/L — ABNORMAL HIGH (ref 0–44)
AST: 737 U/L — ABNORMAL HIGH (ref 15–41)
Albumin: 2.6 g/dL — ABNORMAL LOW (ref 3.5–5.0)
Alkaline Phosphatase: 86 U/L (ref 38–126)
Bilirubin, Direct: 0.9 mg/dL — ABNORMAL HIGH (ref 0.0–0.2)
Indirect Bilirubin: 0.9 mg/dL (ref 0.3–0.9)
Total Bilirubin: 1.8 mg/dL — ABNORMAL HIGH (ref 0.3–1.2)
Total Protein: 5.7 g/dL — ABNORMAL LOW (ref 6.5–8.1)

## 2021-04-21 LAB — CBC
HCT: 41.6 % (ref 39.0–52.0)
Hemoglobin: 13.9 g/dL (ref 13.0–17.0)
MCH: 28.6 pg (ref 26.0–34.0)
MCHC: 33.4 g/dL (ref 30.0–36.0)
MCV: 85.6 fL (ref 80.0–100.0)
Platelets: 112 10*3/uL — ABNORMAL LOW (ref 150–400)
RBC: 4.86 MIL/uL (ref 4.22–5.81)
RDW: 15.6 % — ABNORMAL HIGH (ref 11.5–15.5)
WBC: 4.7 10*3/uL (ref 4.0–10.5)
nRBC: 0 % (ref 0.0–0.2)

## 2021-04-21 LAB — PROCALCITONIN: Procalcitonin: 2.11 ng/mL

## 2021-04-21 MED ORDER — METOCLOPRAMIDE HCL 5 MG/ML IJ SOLN
10.0000 mg | Freq: Four times a day (QID) | INTRAMUSCULAR | Status: DC | PRN
  Administered 2021-04-21 – 2021-04-23 (×4): 10 mg via INTRAVENOUS
  Filled 2021-04-21 (×4): qty 2

## 2021-04-21 NOTE — Evaluation (Signed)
Occupational Therapy Evaluation Patient Details Name: Joshua [name] MRN: [id number] DOB: 1936-01-10 Today's Date: 04/21/2021    History of Present Illness Pt is an 85 y/o male admitted secondary to nausea/vomiting. CT showed duodenal narrowing. Pt recently admitted 8/25-8/30 for CHF. PMH includes COPD on supplemental oxygen, pulmonary HTN, CKD, PE, and L TKA.   Clinical Impression   Pt admitted for concerns listed above. PTA pt reported that he was independent with all ADL's and IADL's, using a cane when in public. At this time, pt presents with mild balance concerns and increased weakness. Pt requiring msupervision to min guard for all ADL's and functional mobility at this time. OT provided education on energy conservation and fall prevention, requesting pt use his RW at home until he is more stable with increased endurance. OT will follow acutely.     Follow Up Recommendations  Home health OT;Supervision - Intermittent    Equipment Recommendations  None recommended by OT    Recommendations for Other Services       Precautions / Restrictions Precautions Precautions: Fall Precaution Comments: watch O2 Restrictions Weight Bearing Restrictions: No      Mobility Bed Mobility Overal bed mobility: Modified Independent                  Transfers Overall transfer level: Needs assistance Equipment used: Rolling walker (2 wheeled) Transfers: Sit to/from Stand Sit to Stand: Min guard;From elevated surface         General transfer comment: Min guard for safety. Cues for safe hand placement.    Balance Overall balance assessment: Mild deficits observed, not formally tested                                         ADL either performed or assessed with clinical judgement   ADL Overall ADL's : Needs assistance/impaired Eating/Feeding: Independent;Sitting   Grooming: Supervision/safety;Standing   Upper Body Bathing: Set up;Sitting   Lower Body  Bathing: Min guard;Sitting/lateral leans;Sit to/from stand   Upper Body Dressing : Set up;Sitting   Lower Body Dressing: Min guard;Sitting/lateral leans;Sit to/from stand   Toilet Transfer: Min guard;Ambulation   Toileting- Clothing Manipulation and Hygiene: Supervision/safety;Sitting/lateral lean;Sit to/from stand   Tub/ Shower Transfer: Min guard;Ambulation   Functional mobility during ADLs: Min guard;Rolling walker General ADL Comments: Supervision to min guard for all ADL's, transfers, and functional mobility for safety due to activity tolerance and safety     Vision Baseline Vision/History: 1 Wears glasses Ability to See in Adequate Light: 0 Adequate Patient Visual Report: No change from baseline Vision Assessment?: No apparent visual deficits     Perception Perception Perception Tested?: No   Praxis Praxis Praxis tested?: Not tested    Pertinent Vitals/Pain Pain Assessment: No/denies pain     Hand Dominance     Extremity/Trunk Assessment Upper Extremity Assessment Upper Extremity Assessment: Overall WFL for tasks assessed   Lower Extremity Assessment Lower Extremity Assessment: Defer to PT evaluation   Cervical / Trunk Assessment Cervical / Trunk Assessment: Normal   Communication Communication Communication: No difficulties   Cognition Arousal/Alertness: Awake/alert Behavior During Therapy: WFL for tasks assessed/performed Overall Cognitive Status: Within Functional Limits for tasks assessed                                     General Comments  VSS on RA, Daughter present and supportive    Exercises     Shoulder Instructions      Home Living Family/patient expects to be discharged to:: Private residence Living Arrangements: Spouse/significant other Available Help at Discharge: Family;Available 24 hours/day Type of Home: House Home Access: Stairs to enter [employer] of Steps: 4 Entrance Stairs-Rails: Right Home  Layout: Two level;Bed/bath upstairs Alternate Level Stairs-Number of Steps: 16 Alternate Level Stairs-Rails: Right Bathroom Shower/Tub: Occupational psychologist: Standard     Home Equipment: Environmental consultant - 2 wheels;Cane - single point          Prior Functioning/Environment Level of Independence: Independent with assistive device(s)        Comments: Uses cane sometimes when out of home. works as a Investment banker, operational.        OT Problem List: Decreased strength;Decreased activity tolerance;Impaired balance (sitting and/or standing);Cardiopulmonary status limiting activity      OT Treatment/Interventions: Self-care/ADL training;Energy conservation;DME and/or AE instruction;Therapeutic activities;Patient/family education;Balance training    OT Goals(Current goals can be found in the care plan section) Acute Rehab OT Goals Patient Stated Goal: to go home and get better OT Goal Formulation: With patient Time For Goal Achievement: 05/05/21 Potential to Achieve Goals: Good ADL Goals Pt Will Perform Lower Body Bathing: with modified independence;sit to/from stand Pt Will Perform Lower Body Dressing: with modified independence;sit to/from stand Pt Will Transfer to Toilet: with modified independence;ambulating Pt Will Perform Toileting - Clothing Manipulation and hygiene: with modified independence;sitting/lateral leans  OT Frequency: Min 2X/week   Barriers to D/C:            Co-evaluation              AM-PAC OT "6 Clicks" Daily Activity     Outcome Measure Help from another person eating meals?: None Help from another person taking care of personal grooming?: A Little Help from another person toileting, which includes using toliet, bedpan, or urinal?: A Little Help from another person bathing (including washing, rinsing, drying)?: A Little Help from another person to put on and taking off regular upper body clothing?: A Little Help from another person to put on and  taking off regular lower body clothing?: A Little 6 Click Score: 19   End of Session Equipment Utilized During Treatment: Rolling walker;Oxygen Nurse Communication: Mobility status  Activity Tolerance: Patient tolerated treatment well Patient left: in chair;with call bell/phone within reach;with family/visitor present  OT Visit Diagnosis: Unsteadiness on feet (R26.81);Other abnormalities of gait and mobility (R26.89);Muscle weakness (generalized) (M62.81)                Time: 9767-3419 OT Time Calculation (min): 27 min Charges:  OT General Charges $OT Visit: 1 Visit OT Evaluation $OT Eval Moderate Complexity: 1 Mod OT Treatments $Self Care/Home Management : 8-22 mins  Joshua [name] H., OTR/L Acute Rehabilitation  Jasyn Mey Elane Ayson Cherubini 04/21/2021, 2:12 PM

## 2021-04-21 NOTE — Progress Notes (Signed)
Subjective: Feels well today.  Denies abdominal pain.  No nausea or vomiting and tolerating CLD with no issues.  ROS: See above, otherwise other systems negative  Objective: Vital signs in last 24 hours: Temp:  [97.4 F (36.3 C)-98.2 F (36.8 C)] 97.6 F (36.4 C) (09/03 0752) Pulse Rate:  [74-91] 78 (09/03 0752) Resp:  [15-20] 16 (09/03 0752) BP: (91-101)/(56-65) 91/56 (09/03 0752) SpO2:  [89 %-97 %] 95 % (09/03 0752) Weight:  [100.3 kg] 100.3 kg (09/03 0019) Last BM Date: 04/18/21  Intake/Output from previous day: 09/02 0701 - 09/03 0700 In: 663.8 [I.V.:663.8] Out: 400 [Urine:350; Emesis/NG output:50] Intake/Output this shift: Total I/O In: -  Out: 500 [Urine:500]  PE: Abd: soft, NT, ND, +BS  Lab Results:  Recent Labs    04/20/21 0119 04/21/21 0045  WBC 8.8 4.7  HGB 13.8 13.9  HCT 42.7 41.6  PLT 112* 112*   BMET Recent Labs    04/20/21 0119 04/21/21 0045  NA 134* 134*  K 4.6 3.8  CL 89* 95*  CO2 33* 31  GLUCOSE 78 71  BUN 50* 49*  CREATININE 3.70* 2.84*  CALCIUM 9.4 8.7*   PT/INR No results for input(s): LABPROT, INR in the last 72 hours. CMP     Component Value Date/Time   NA 134 (L) 04/21/2021 0045   NA 137 12/07/2020 1100   K 3.8 04/21/2021 0045   CL 95 (L) 04/21/2021 0045   CO2 31 04/21/2021 0045   GLUCOSE 71 04/21/2021 0045   BUN 49 (H) 04/21/2021 0045   BUN 31 (H) 12/07/2020 1100   CREATININE 2.84 (H) 04/21/2021 0045   CALCIUM 8.7 (L) 04/21/2021 0045   PROT 5.7 (L) 04/21/2021 0045   ALBUMIN 2.6 (L) 04/21/2021 0045   AST 737 (H) 04/21/2021 0045   ALT 707 (H) 04/21/2021 0045   ALKPHOS 86 04/21/2021 0045   BILITOT 1.8 (H) 04/21/2021 0045   GFRNONAA 21 (L) 04/21/2021 0045   GFRAA >60 08/01/2018 1056   Lipase     Component Value Date/Time   LIPASE 24 04/19/2021 0915       Studies/Results: CT ABDOMEN PELVIS WO CONTRAST  Result Date: 04/19/2021 CLINICAL DATA:  Concern for bowel obstruction, vomiting for 2 days EXAM: CT  ABDOMEN AND PELVIS WITHOUT CONTRAST TECHNIQUE: Multidetector CT imaging of the abdomen and pelvis was performed following the standard protocol without IV contrast. COMPARISON:  None. FINDINGS: Lower chest: There is a trace right pleural effusion with bibasilar subsegmental atelectasis. The heart is enlarged. Coronary artery calcifications are noted. There is trace pericardial fluid. Hepatobiliary: The liver is within normal limits, within the confines of noncontrast technique. Pancreas: The pancreas is atrophic but otherwise unremarkable. There is no focal lesion or contour abnormality. There is no main pancreatic ductal dilatation or peripancreatic inflammatory change. Spleen: Unremarkable. Adrenals/Urinary Tract: The adrenals are unremarkable. There is a 2.2 cm left lower pole renal cyst. There are no other focal lesions. There are no stones. There is no hydronephrosis or hydroureter. The bladder wall is thickened, likely reflecting chronic outlet obstruction. Stomach/Bowel: There is a moderate hiatal hernia. The stomach is otherwise unremarkable. There is dilation of the third portion of the duodenum with marked narrowing as the duodenum passes between the aneurysmal aorta and the SMV. Otherwise, there is no evidence of bowel obstruction. There is no evidence of bowel wall thickening or inflammatory change. The appendix is normal. There are scattered colonic diverticula without evidence of acute diverticulitis. Vascular/Lymphatic: There  is extensive calcified atherosclerotic plaque throughout the arterial vasculature. The infrarenal abdominal aorta is aneurysmal measuring up to 3.5 cm. There is no evidence of rupture. There is no abdominal or pelvic lymphadenopathy. Reproductive: The prostate is enlarged measuring up to 6.4 cm and impresses upon the inferior aspect of the bladder. Other: There is scattered fluid in the abdomen measuring greater than simple fluid attenuation, predominantly around the liver tip  and spleen. There is no free intraperitoneal air. There is a small umbilical hernia containing a loop of unobstructed small bowel. Musculoskeletal: There is mild multilevel degenerative change of the spine. There is no acute osseous abnormality or aggressive osseous lesion. IMPRESSION: 1. Abrupt narrowing of the third portion of the duodenum as it passes between the aneurysmal abdominal aorta and SMV with upstream dilation is suggestive of nutcracker phenomenon which may account for the patient's symptoms. No evidence of high-grade bowel obstruction. 2. Scattered ascites throughout the abdomen measuring greater than simple fluid attenuation. This may reflect proteinaceous debris; however, hemoperitoneum is not excluded. No source of bleeding is identified. 3. Infrarenal abdominal aortic aneurysm measuring up to 3.5 cm without evidence of rupture. Recommend follow-up ultrasound every 2 years. This recommendation follows ACR consensus guidelines: White Paper of the ACR Incidental Findings Committee II on Vascular Findings. J Am Coll Radiol 2013; 10:789-794. 4. Small ventral abdominal hernia containing a loop of nonobstructed small bowel. 5. Moderate-sized hiatal hernia. 6. Thickened bladder wall likely reflecting chronic outlet obstruction from the enlarged prostate. 7. Cardiomegaly. Electronically Signed   By: Valetta Mole M.D.   On: 04/19/2021 14:24   US RENAL  Result Date: 04/19/2021 CLINICAL DATA:  Acute kidney injury EXAM: RENAL / URINARY TRACT ULTRASOUND COMPLETE COMPARISON:  CT 04/19/2021 FINDINGS: Right Kidney: Renal measurements: 10.4 x 5.4 x 5.3 cm = volume: 155 mL. Echogenicity within normal limits. No mass, shadowing stone, or hydronephrosis visualized. Left Kidney: Renal measurements: 10.9 x 5.5 x 5.9 cm = volume: 185 mL. Echogenicity within normal limits. 2.8 cm cyst within the interpolar region of the left kidney. No shadowing stone or hydronephrosis visualized. Bladder: Diffuse urinary bladder wall  thickening. Other: Prostatomegaly. IMPRESSION: 1. No renal stone or hydronephrosis. 2. Prostatomegaly with diffuse urinary bladder wall thickening, which may be related to chronic outlet obstruction. Electronically Signed   By: Davina Poke D.O.   On: 04/19/2021 17:40   DG UGI W SINGLE CM (SOL OR THIN BA)  Result Date: 04/20/2021 CLINICAL DATA:  Evaluate for intestinal stricture.  Abnormal CT. EXAM: UPPER GI SERIES WITH KUB TECHNIQUE: After obtaining a scout radiograph a routine upper GI series was performed using thin barium FLUOROSCOPY TIME:  Fluoroscopy Time:  2 minutes 18 seconds Radiation Exposure Index (if provided by the fluoroscopic device): 31.2 mGy Number of Acquired Spot Images: 0 COMPARISON:  CT AP 04/19/2021. FINDINGS: The esophagus appears patent. No stricture or mass identified. There is a moderate size hiatal hernia. The stomach appears nondistended. Increased caliber of the duodenum is identified. Contrast however passes through the horizontal portion to the ligament of Treitz and into the mildly dilated proximal small bowel loops without signs of duodenal stricture. IMPRESSION: 1. No signs of significant duodenal narrowing/stricture. 2. Increased caliber of the duodenum is noted to the level of the ligament of Treitz. However, the proximal jejunal loops are also mildly increased in caliber. Findings may reflect sequelae of mild low-grade partial small-bowel obstruction or proximal small bowel ileus. Electronically Signed   By: Kerby Moors M.D.   On: 04/20/2021  15:13   US Abdomen Limited RUQ (LIVER/GB)  Result Date: 04/19/2021 CLINICAL DATA:  Transaminitis EXAM: ULTRASOUND ABDOMEN LIMITED RIGHT UPPER QUADRANT COMPARISON:  CT 04/19/2021 FINDINGS: Gallbladder: Surgically absent Common bile duct: Diameter: 4.3 mm Liver: No focal lesion identified. Within normal limits in parenchymal echogenicity. Portal vein is patent on color Doppler imaging with normal direction of blood flow towards the  liver. Other: Trace ascites adjacent to the liver IMPRESSION: Status post cholecystectomy.  Small amount of perihepatic ascites Electronically Signed   By: Donavan Foil M.D.   On: 04/19/2021 22:48    Anti-infectives: Anti-infectives (From admission, onward)    None        Assessment/Plan Duodenal narrowing secondary to AAA/SMV compression Chronic nausea, emesis -UGI did NOT show any narrowing or stricturing of his duodenum.  He did have some increased caliber through LoT, but also some passed this into proximal jejunum. -tolerating CLD -can adv to soft diet as able. -no needs for surgical intervention given above findings and patient also not a great candidate for any type of surgery at this time.  We will sign off, but are available if needed.  FEN - soft VTE - per primary ID - per primary   LOS: 2 days    Henreitta Cea , [facility] Surgery 04/21/2021, 9:49 AM Please see Amion for pager number during day hours 7:00am-4:30pm or 7:00am -11:30am on weekends

## 2021-04-21 NOTE — Evaluation (Signed)
Physical Therapy Evaluation Patient Details Name: Joshua [name] MRN: [id number] DOB: 12/07/1935 Today's Date: 04/21/2021   History of Present Illness  Pt is an 85 y/o male admitted secondary to nausea/vomiting. CT showed duodenal narrowing. Pt recently admitted 8/25-8/30 for CHF. PMH includes COPD on supplemental oxygen, pulmonary HTN, CKD, PE, and L TKA.  Clinical Impression  Pt admitted secondary to problem above with deficits below. Pt requiring min guard to supervision with mobility tasks using RW. Oxygen sats briefly dropping to 79% on 4L during ambulation, but did not have good pleth. Otherwise, oxygen sats at 87-90% on 4L throughout. Anticipate pt will progress well and will not require follow up PT. Will continue to follow acutely.     Follow Up Recommendations No PT follow up    Equipment Recommendations  None recommended by PT    Recommendations for Other Services       Precautions / Restrictions Precautions Precautions: Fall Precaution Comments: watch O2 Restrictions Weight Bearing Restrictions: No      Mobility  Bed Mobility Overal bed mobility: Modified Independent                  Transfers Overall transfer level: Needs assistance Equipment used: Rolling walker (2 wheeled) Transfers: Sit to/from Stand Sit to Stand: Min guard;From elevated surface         General transfer comment: Min guard for safety. Cues for safe hand placement.  Ambulation/Gait Ambulation/Gait assistance: Min guard;Supervision Gait Distance (Feet): 75 Feet Assistive device: Rolling walker (2 wheeled) Gait Pattern/deviations: Step-through pattern;Decreased stride length Gait velocity: Decreased   General Gait Details: Slow, cautious gait. No overt LOB noted. Pt oxygen sats breifly reading 79% on 4L, but did not have good pleth. Otherwise oxygen sats between 87-90% on 4L.  Stairs            Wheelchair Mobility    Modified Rankin (Stroke Patients Only)        Balance Overall balance assessment: Mild deficits observed, not formally tested                                           Pertinent Vitals/Pain Pain Assessment: No/denies pain    Home Living Family/patient expects to be discharged to:: Private residence Living Arrangements: Spouse/significant other Available Help at Discharge: Family;Available 24 hours/day Type of Home: House Home Access: Stairs to enter Entrance Stairs-Rails: Right Entrance Stairs-Number of Steps: 4 Home Layout: Two level;Bed/bath upstairs Home Equipment: Walker - 2 wheels;Cane - single point      Prior Function Level of Independence: Independent with assistive device(s)         Comments: Uses cane sometimes when out of home. works as a Investment banker, operational.     Hand Dominance        Extremity/Trunk Assessment   Upper Extremity Assessment Upper Extremity Assessment: Defer to OT evaluation    Lower Extremity Assessment Lower Extremity Assessment: Generalized weakness    Cervical / Trunk Assessment Cervical / Trunk Assessment: Normal  Communication   Communication: No difficulties  Cognition Arousal/Alertness: Awake/alert Behavior During Therapy: WFL for tasks assessed/performed Overall Cognitive Status: Within Functional Limits for tasks assessed                                        General Comments General  comments (skin integrity, edema, etc.): Pt's daughter present during session    Exercises     Assessment/Plan    PT Assessment Patient needs continued PT services  PT Problem List Decreased strength;Decreased activity tolerance;Decreased balance;Decreased mobility       PT Treatment Interventions Gait training;Stair training;Functional mobility training;Therapeutic activities;Therapeutic exercise;Balance training;Patient/family education;DME instruction    PT Goals (Current goals can be found in the Care Plan section)  Acute Rehab PT  Goals Patient Stated Goal: to go home and get better PT Goal Formulation: With patient Time For Goal Achievement: 05/05/21 Potential to Achieve Goals: Good    Frequency Min 3X/week   Barriers to discharge        Co-evaluation               AM-PAC PT "6 Clicks" Mobility  Outcome Measure Help needed turning from your back to your side while in a flat bed without using bedrails?: None Help needed moving from lying on your back to sitting on the side of a flat bed without using bedrails?: None Help needed moving to and from a bed to a chair (including a wheelchair)?: A Little Help needed standing up from a chair using your arms (e.g., wheelchair or bedside chair)?: A Little Help needed to walk in hospital room?: A Little Help needed climbing 3-5 steps with a railing? : A Little 6 Click Score: 20    End of Session Equipment Utilized During Treatment: Oxygen;Gait belt Activity Tolerance: Patient tolerated treatment well Patient left: in chair;with call bell/phone within reach Nurse Communication: Mobility status PT Visit Diagnosis: Unsteadiness on feet (R26.81);Muscle weakness (generalized) (M62.81)    Time: 1594-7076 PT Time Calculation (min) (ACUTE ONLY): 31 min   Charges:   PT Evaluation $PT Eval Moderate Complexity: 1 Mod PT Treatments $Gait Training: 8-22 mins        Lou Miner, DPT  Acute Rehabilitation Services  Pager: [phone] Office: [phone]   Joshua [name] 04/21/2021, 12:14 PM

## 2021-04-21 NOTE — Progress Notes (Signed)
PROGRESS NOTE    Joshua [name]  KPT:[id number] DOB: 04/02/1936 DOA: 04/19/2021 PCP: [name], DO    Brief Narrative:  Joshua [name] is an 85 year old male with past medical history significant for chronic diastolic CHF, chronic RV failure, hypotension on midodrine, COPD on 3 L nasal cannula at baseline, BPH, CKD stage IIIa, PE on Eliquis who presented to [facility] ED on 9/1 with intractable nausea/vomiting.  Patient reports onset Tuesday night with at least 20 episodes of emesis on Wednesday.  Denies hematemesis and no associated abdominal pain.  Patient also reports decreased urine output, self caths about 3 times a day.  Denies any dysuria, or color change within his urine.  Further denies fevers, chills, shortness of breath, coughing, no chest pain/palpitations, no diarrhea/constipation.  Denies any increased leg swelling.  Recently discharged on 8/30 for CHF exacerbation.  Was initially started on midodrine and [place].  In the ED, temperature 99.1 F, HR 87, RR 20, BP 61/41, SPO2 93% on 3 L nasal cannula.  Sodium 135, potassium 5.5, chloride 89, CO2 29, glucose 117, BUN 41, creatinine 3.69.  WBC 12.1, hemoglobin 1214.7, platelets 117.  Lipase 24, AST 416, ALT 197.  BNP 2618.  High sensitive troponin 247>499.  Chest x-ray with no focal opacity, increased interstitial coarsening/mild edema.  EKG with normal sinus rhythm, rate 83, QTc 495.  CT abdomen/pelvis without contrast with abrupt narrowing third portion of duodenum between aneurysmal abdominal aorta or and SMV with upstream dilation suggestive of nutcracker phenomenon, no evidence of high-grade bowel obstruction, infrarenal abdominal aortic aneurysm 3.5 cm, moderate hiatal hernia, thickened bladder, cardiomegaly, small ventral abdominal hernia containing a loop of nonobstructed small bowel.  Cardiology and general surgery were consulted.  Duration consulted for further evaluation and management of intractable nausea vomiting, hypotension,  elevated troponin, transaminitis.   Assessment & Plan:   Principal Problem:   Intractable nausea and vomiting Active Problems:   History of pulmonary embolism   Chronic respiratory failure with hypoxia (HCC)   COPD (chronic obstructive pulmonary disease) (HCC)   BPH with urinary obstruction   Chronic right-sided heart failure (HCC)   Acute renal failure superimposed on stage 3 chronic kidney disease (HCC)   Hyperkalemia   Transaminitis   Elevated troponin   Duodenal anomaly   Intractable nausea/vomiting Duodenal stenosis Patient presenting to ED with persistent intractable nausea/vomiting.  CT abdomen/pelvis notable for abrupt narrowing third portion of duodenum without high-grade obstruction; concern for possible nutcracker phenomenon.  Denies any abdominal discomfort.  Upper GI series with no signs of significant duodenal narrowing/stricture.  Neurosurgery now signed off. --General surgery following, appreciate assistance --Clear liquid diet transition to soft diet --Zofran as needed  Transaminitis: Improving These are new findings with LFTs that were normal on 8/25.  No history of cirrhosis or portal hypertension's.  No abnormal findings noted on CT abdomen/pelvis other than some scattered ascites.  Acute hepatitis panel negative.  High suspicion from shock liver with significant hypotension. --AST 170>0174>944 (20 on 8/27) --ALT [id number] (12 on 8/27) --Avoid hepatotoxins, especially as Tylenol/statins --monitor LFTs daily  Hypotension Patient recently admitted and discharged following CHF exacerbation with noted hypotension with recent started on midodrine.  Patient was noted to have significant hypotension on arrival with SBP 61. --Midodrine increased to 15 mg p.o. 3 times daily --Continue monitor BP closely  Elevated troponin High sensitive troponin [phone].  Etiology likely secondary to demand ischemia from acute illness/hypotension.  Denies chest  pain. --Cardiology following, continue to monitor on telemetry  Acute renal failure on CKD stage IIIa: Improving On arrival, patient's creatinine elevated at 3.87.  Baseline Cr 1.5.  Etiology likely secondary to ATN from significant hypotension versus prerenal azotemia from dehydration with intractable nausea vomiting; also concern for cardiorenal syndrome. FeNa = 0.8%; which is more consistent with a prerenal azotemia. --Cr 3.87>3.70>2.84 --IV fluids now discontinued --Avoid nephrotoxins, renal dose all medications --BMP daily  Hyperkalemia: Resolved Potassium elevated at 6.2 on admission.  Patient was given NovoLog, dextrose and Lokelma.  Etiology likely secondary to acute renal failure as above vs home K supplement. --K 6.2>>4.6> 3.8 --Home potassium supplement has been discontinued --Monitor on telemetry --Follow BMP daily  Chronic diastolic congestive heart failure Pulmonary hypertension Chronic right-sided heart failure TTE 09/2020 with LVEF 55-60%, fairly reduced RV systolic function, RV severely enlarged, moderate pulmonary artery systolic pressure.  Recently hospitalized and discharged on 8/30 for CHF exacerbation, with 20 pound weight loss.  At baseline on torsemide 80 mg p.o. every morning and 6 mg p.o. every afternoon. --Cardiology following, appreciate assistance --Currently holding diuretics in the setting of renal failure --Strict I's and O's and daily weights  COPD, oxygen dependent Chronic hypoxic respiratory failure At baseline on 3 L nasal cannula.  PFTs with severe obstruction and moderate restriction on 6/22.  Not in acute exacerbation. --Incruse Ellipta 62.5 mcg 1 puff daily --Albuterol neb every 4 hours as needed shortness of breath/wheezing --Continue supplemental oxygen, maintain SPO2 greater than 88%.  Hx pulmonary embolism --Eliquis 5 mg p.o. twice daily  Hx CTEPH --Riociguat 1.31m BID  Hx OSA --Intolerant to CPAP  Infrarenal abdominal aortic  aneurysm Measuring up to 3.5 cm on imaging.  Recommend repeat ultrasound every 2 years.  GERD: Protonix 40 mg p.o. daily  BPH/chronic urinary retention Patient reports self catheterize up to 3 times daily. --Finasteride 5 mg p.o. nightly --Continued home Cardura 2/2 hypotension   DVT prophylaxis: Place TED hose Start: 04/19/21 1341 apixaban (ELIQUIS) tablet 5 mg    Code Status: Full Code Family Communication: Updated family present at bedside this morning  Disposition Plan:  Level of care: Progressive Status is: Inpatient  Remains inpatient appropriate because:Ongoing diagnostic testing needed not appropriate for outpatient work up, Unsafe d/c plan, IV treatments appropriate due to intensity of illness or inability to take PO, and Inpatient level of care appropriate due to severity of illness  Dispo: The patient is from: Home              Anticipated d/c is to: Home              Patient currently is not medically stable to d/c.   Difficult to place patient No   Consultants:  Cardiology/heart failure team, Dr. MAundra DubinGeneral surgery, Dr. TGeorgette Dover- signed off 9/3  Procedures:  None  Antimicrobials:  None   Subjective: Patient seen examined at bedside, resting comfortably.  No specific complaints this morning.  Does not like clear liquid diet.  Seen by general surgery this morning with advancement to soft diet with no significant narrowing/stricture of duodenal segment seen on CT on admission.  Family and primary RN present at bedside and updated.  Awaiting further cardiology recommendations.  Renal function and transaminitis both improving.  Patient wishes to discharge home today, although family concerned about his mobility and requesting PT/OT evaluation.  Currently denies headache, no chest pain, no palpitations, no shortness of breath more than his normal baseline, no abdominal pain, no weakness, no fatigue, no active nausea/vomiting, no diarrhea, no weakness,  no fatigue, no  paresthesias.  No acute events overnight per nursing staff.  Objective: Vitals:   04/20/21 2333 04/21/21 0019 04/21/21 0406 04/21/21 0752  BP: 97/65  (!) 101/58 (!) 91/56  Pulse: 74 81 91 78  Resp: _0 Temp: (!) 97.4 F (36.3 C)  97.6 F (36.4 C) 97.6 F (36.4 C)  TempSrc: Oral  Oral Oral  SpO2: 91% (!) 89% 92% 95%  Weight:  100.3 kg    Height:        Intake/Output Summary (Last 24 hours) at 04/21/2021 1038 Last data filed at 04/21/2021 1034 Gross per 24 hour  Intake 663.78 ml  Output 1050 ml  Net -386.22 ml   Filed Weights   04/19/21 1723 04/20/21 0417 04/21/21 0019  Weight: 98.3 kg 100.8 kg 100.3 kg    Examination:  General exam: Appears calm and comfortable, elderly/chronically ill in appearance Respiratory system: Clear to auscultation. Respiratory effort normal.  On 3 L nasal cannula which is his baseline Cardiovascular system: S1 & S2 heard, RRR. No JVD, murmurs, rubs, gallops or clicks. No pedal edema. Gastrointestinal system: Abdomen is nondistended, soft and nontender. No organomegaly or masses felt. Normal bowel sounds heard. Central nervous system: Alert and oriented. No focal neurological deficits. Extremities: Symmetric 5 x 5 power. Skin: No rashes, lesions or ulcers Psychiatry: Judgement and insight appear normal. Mood & affect appropriate.     Data Reviewed: I have personally reviewed following labs and imaging studies  CBC: Recent Labs  Lab 04/16/21 0131 04/17/21 0210 04/19/21 0915 04/20/21 0119 04/21/21 0045  WBC 4.0 4.5 12.1* 8.8 4.7  NEUTROABS  --  2.8 10.8*  --   --   HGB 14.8 14.7 14.7 13.8 13.9  HCT 44.4 44.2 45.2 42.7 41.6  MCV 85.4 86.0 89.2 87.3 85.6  PLT 153 161 117* 112* 562*   Basic Metabolic Panel: Recent Labs  Lab 04/15/21 0520 04/16/21 0131 04/17/21 0210 04/19/21 0915 04/19/21 1255 04/19/21 1713 04/19/21 1718 04/20/21 0119 04/21/21 0045  NA 137 136 136 135 134* 133*  --  134* 134*  K 3.3* 3.7 3.6 5.5* 6.2*  4.8  --  4.6 3.8  CL 94* 93* 90* 89* 89* 90*  --  89* 95*  CO2 36* 36* 38* 29 34* 34*  --  33* 31  GLUCOSE 90 95 94 117* 103* 71  --  78 71  BUN _1 41* 44* 45*  --  50* 49*  CREATININE 1.41* 1.39* 1.47* 3.69* 3.74* 3.87*  --  3.70* 2.84*  CALCIUM 9.1 9.1 9.1 9.8 9.5 9.5  --  9.4 8.7*  MG 2.1 1.8 1.7  --   --   --  2.1  --   --    GFR: Estimated Creatinine Clearance: 23.6 mL/min (A) (by C-G formula based on SCr of 2.84 mg/dL (H)). Liver Function Tests: Recent Labs  Lab 04/19/21 0915 04/20/21 0119 04/21/21 0045  AST 416* 1,475* 737*  ALT 197* 879* 707*  ALKPHOS 107 95 86  BILITOT 2.3* 1.8* 1.8*  PROT 6.8 6.2* 5.7*  ALBUMIN 3.1* 2.8* 2.6*   Recent Labs  Lab 04/19/21 0915  LIPASE 24   No results for input(s): AMMONIA in the last 168 hours. Coagulation Profile: No results for input(s): INR, PROTIME in the last 168 hours. Cardiac Enzymes: No results for input(s): CKTOTAL, CKMB, CKMBINDEX, TROPONINI in the last 168 hours. BNP (last 3 results) No results for input(s): PROBNP in the last 8760 hours. HbA1C:  No results for input(s): HGBA1C in the last 72 hours. CBG: Recent Labs  Lab 04/19/21 1506  GLUCAP 92   Lipid Profile: No results for input(s): CHOL, HDL, LDLCALC, TRIG, CHOLHDL, LDLDIRECT in the last 72 hours. Thyroid Function Tests: No results for input(s): TSH, T4TOTAL, FREET4, T3FREE, THYROIDAB in the last 72 hours. Anemia Panel: No results for input(s): VITAMINB12, FOLATE, FERRITIN, TIBC, IRON, RETICCTPCT in the last 72 hours. Sepsis Labs: Recent Labs  Lab 04/20/21 0119 04/21/21 0045  PROCALCITON 3.35 2.11    Recent Results (from the past 240 hour(s))  Resp Panel by RT-PCR (Flu A&B, Covid) Nasopharyngeal Swab     Status: None   Collection Time: 04/12/21  3:45 PM   Specimen: Nasopharyngeal Swab; Nasopharyngeal(NP) swabs in vial transport medium  Result Value Ref Range Status   SARS Coronavirus 2 by RT PCR NEGATIVE NEGATIVE Final    Comment:  (NOTE) SARS-CoV-2 target nucleic acids are NOT DETECTED.  The SARS-CoV-2 RNA is generally detectable in upper respiratory specimens during the acute phase of infection. The lowest concentration of SARS-CoV-2 viral copies this assay can detect is 138 copies/mL. A negative result does not preclude SARS-Cov-2 infection and should not be used as the sole basis for treatment or other patient management decisions. A negative result may occur with  improper specimen collection/handling, submission of specimen other than nasopharyngeal swab, presence of viral mutation(s) within the areas targeted by this assay, and inadequate number of viral copies(<138 copies/mL). A negative result must be combined with clinical observations, patient history, and epidemiological information. The expected result is Negative.  Fact Sheet for Patients:  EntrepreneurPulse.com.au  Fact Sheet for Healthcare Providers:  IncredibleEmployment.be  This test is no t yet approved or cleared by the [place] FDA and  has been authorized for detection and/or diagnosis of SARS-CoV-2 by FDA under an Emergency Use Authorization (EUA). This EUA will remain  in effect (meaning this test can be used) for the duration of the COVID-19 declaration under Section 564(b)(1) of the Act, 21 U.S.C.section 360bbb-3(b)(1), unless the authorization is terminated  or revoked sooner.       Influenza A by PCR NEGATIVE NEGATIVE Final   Influenza B by PCR NEGATIVE NEGATIVE Final    Comment: (NOTE) The Xpert Xpress SARS-CoV-2/FLU/RSV plus assay is intended as an aid in the diagnosis of influenza from Nasopharyngeal swab specimens and should not be used as a sole basis for treatment. Nasal washings and aspirates are unacceptable for Xpert Xpress SARS-CoV-2/FLU/RSV testing.  Fact Sheet for Patients: EntrepreneurPulse.com.au  Fact Sheet for Healthcare  Providers: IncredibleEmployment.be  This test is not yet approved or cleared by the [place] FDA and has been authorized for detection and/or diagnosis of SARS-CoV-2 by FDA under an Emergency Use Authorization (EUA). This EUA will remain in effect (meaning this test can be used) for the duration of the COVID-19 declaration under Section 564(b)(1) of the Act, 21 U.S.C. section 360bbb-3(b)(1), unless the authorization is terminated or revoked.  Performed at [facility], [place] [address], [place], [place] [address]   Resp Panel by RT-PCR (Flu A&B, Covid) Nasopharyngeal Swab     Status: None   Collection Time: 04/19/21  9:34 AM   Specimen: Nasopharyngeal Swab; Nasopharyngeal(NP) swabs in vial transport medium  Result Value Ref Range Status   SARS Coronavirus 2 by RT PCR NEGATIVE NEGATIVE Final    Comment: (NOTE) SARS-CoV-2 target nucleic acids are NOT DETECTED.  The SARS-CoV-2 RNA is generally detectable in upper respiratory specimens during  the acute phase of infection. The lowest concentration of SARS-CoV-2 viral copies this assay can detect is 138 copies/mL. A negative result does not preclude SARS-Cov-2 infection and should not be used as the sole basis for treatment or other patient management decisions. A negative result may occur with  improper specimen collection/handling, submission of specimen other than nasopharyngeal swab, presence of viral mutation(s) within the areas targeted by this assay, and inadequate number of viral copies(<138 copies/mL). A negative result must be combined with clinical observations, patient history, and epidemiological information. The expected result is Negative.  Fact Sheet for Patients:  EntrepreneurPulse.com.au  Fact Sheet for Healthcare Providers:  IncredibleEmployment.be  This test is no t yet approved or cleared by the [place] FDA and  has been authorized for  detection and/or diagnosis of SARS-CoV-2 by FDA under an Emergency Use Authorization (EUA). This EUA will remain  in effect (meaning this test can be used) for the duration of the COVID-19 declaration under Section 564(b)(1) of the Act, 21 U.S.C.section 360bbb-3(b)(1), unless the authorization is terminated  or revoked sooner.       Influenza A by PCR NEGATIVE NEGATIVE Final   Influenza B by PCR NEGATIVE NEGATIVE Final    Comment: (NOTE) The Xpert Xpress SARS-CoV-2/FLU/RSV plus assay is intended as an aid in the diagnosis of influenza from Nasopharyngeal swab specimens and should not be used as a sole basis for treatment. Nasal washings and aspirates are unacceptable for Xpert Xpress SARS-CoV-2/FLU/RSV testing.  Fact Sheet for Patients: EntrepreneurPulse.com.au  Fact Sheet for Healthcare Providers: IncredibleEmployment.be  This test is not yet approved or cleared by the [place] FDA and has been authorized for detection and/or diagnosis of SARS-CoV-2 by FDA under an Emergency Use Authorization (EUA). This EUA will remain in effect (meaning this test can be used) for the duration of the COVID-19 declaration under Section 564(b)(1) of the Act, 21 U.S.C. section 360bbb-3(b)(1), unless the authorization is terminated or revoked.  Performed at [facility], [place] [address] [place], [place] [address]   Culture, blood (routine x 2)     Status: None (Preliminary result)   Collection Time: 04/20/21 11:50 AM   Specimen: BLOOD RIGHT ARM  Result Value Ref Range Status   Specimen Description BLOOD RIGHT ARM  Final   Special Requests   Final    BOTTLES DRAWN AEROBIC AND ANAEROBIC Blood Culture adequate volume   Culture   Final    NO GROWTH < 24 HOURS Performed at [facility], [place] [address], [place], [place] [address]    Report Status PENDING  Incomplete  Culture, blood (routine x 2)     Status: None (Preliminary result)    Collection Time: 04/20/21 11:56 AM   Specimen: BLOOD LEFT HAND  Result Value Ref Range Status   Specimen Description BLOOD LEFT HAND  Final   Special Requests   Final    BOTTLES DRAWN AEROBIC ONLY Blood Culture results may not be optimal due to an inadequate volume of blood received in culture bottles   Culture   Final    NO GROWTH < 24 HOURS Performed at [facility], [place] [address], [place], [place] [address]    Report Status PENDING  Incomplete         Radiology Studies: CT ABDOMEN PELVIS WO CONTRAST  Result Date: 04/19/2021 CLINICAL DATA:  Concern for bowel obstruction, vomiting for 2 days EXAM: CT ABDOMEN AND PELVIS WITHOUT CONTRAST TECHNIQUE: Multidetector CT imaging of the abdomen  and pelvis was performed following the standard protocol without IV contrast. COMPARISON:  None. FINDINGS: Lower chest: There is a trace right pleural effusion with bibasilar subsegmental atelectasis. The heart is enlarged. Coronary artery calcifications are noted. There is trace pericardial fluid. Hepatobiliary: The liver is within normal limits, within the confines of noncontrast technique. Pancreas: The pancreas is atrophic but otherwise unremarkable. There is no focal lesion or contour abnormality. There is no main pancreatic ductal dilatation or peripancreatic inflammatory change. Spleen: Unremarkable. Adrenals/Urinary Tract: The adrenals are unremarkable. There is a 2.2 cm left lower pole renal cyst. There are no other focal lesions. There are no stones. There is no hydronephrosis or hydroureter. The bladder wall is thickened, likely reflecting chronic outlet obstruction. Stomach/Bowel: There is a moderate hiatal hernia. The stomach is otherwise unremarkable. There is dilation of the third portion of the duodenum with marked narrowing as the duodenum passes between the aneurysmal aorta and the SMV. Otherwise, there is no evidence of bowel obstruction. There is no evidence of bowel wall thickening or  inflammatory change. The appendix is normal. There are scattered colonic diverticula without evidence of acute diverticulitis. Vascular/Lymphatic: There is extensive calcified atherosclerotic plaque throughout the arterial vasculature. The infrarenal abdominal aorta is aneurysmal measuring up to 3.5 cm. There is no evidence of rupture. There is no abdominal or pelvic lymphadenopathy. Reproductive: The prostate is enlarged measuring up to 6.4 cm and impresses upon the inferior aspect of the bladder. Other: There is scattered fluid in the abdomen measuring greater than simple fluid attenuation, predominantly around the liver tip and spleen. There is no free intraperitoneal air. There is a small umbilical hernia containing a loop of unobstructed small bowel. Musculoskeletal: There is mild multilevel degenerative change of the spine. There is no acute osseous abnormality or aggressive osseous lesion. IMPRESSION: 1. Abrupt narrowing of the third portion of the duodenum as it passes between the aneurysmal abdominal aorta and SMV with upstream dilation is suggestive of nutcracker phenomenon which may account for the patient's symptoms. No evidence of high-grade bowel obstruction. 2. Scattered ascites throughout the abdomen measuring greater than simple fluid attenuation. This may reflect proteinaceous debris; however, hemoperitoneum is not excluded. No source of bleeding is identified. 3. Infrarenal abdominal aortic aneurysm measuring up to 3.5 cm without evidence of rupture. Recommend follow-up ultrasound every 2 years. This recommendation follows ACR consensus guidelines: White Paper of the ACR Incidental Findings Committee II on Vascular Findings. J Am Coll Radiol 2013; 10:789-794. 4. Small ventral abdominal hernia containing a loop of nonobstructed small bowel. 5. Moderate-sized hiatal hernia. 6. Thickened bladder wall likely reflecting chronic outlet obstruction from the enlarged prostate. 7. Cardiomegaly.  Electronically Signed   By: Valetta Mole M.D.   On: 04/19/2021 14:24   US RENAL  Result Date: 04/19/2021 CLINICAL DATA:  Acute kidney injury EXAM: RENAL / URINARY TRACT ULTRASOUND COMPLETE COMPARISON:  CT 04/19/2021 FINDINGS: Right Kidney: Renal measurements: 10.4 x 5.4 x 5.3 cm = volume: 155 mL. Echogenicity within normal limits. No mass, shadowing stone, or hydronephrosis visualized. Left Kidney: Renal measurements: 10.9 x 5.5 x 5.9 cm = volume: 185 mL. Echogenicity within normal limits. 2.8 cm cyst within the interpolar region of the left kidney. No shadowing stone or hydronephrosis visualized. Bladder: Diffuse urinary bladder wall thickening. Other: Prostatomegaly. IMPRESSION: 1. No renal stone or hydronephrosis. 2. Prostatomegaly with diffuse urinary bladder wall thickening, which may be related to chronic outlet obstruction. Electronically Signed   By: Davina Poke D.O.   On: 04/19/2021  17:40   DG UGI W SINGLE CM (SOL OR THIN BA)  Result Date: 04/20/2021 CLINICAL DATA:  Evaluate for intestinal stricture.  Abnormal CT. EXAM: UPPER GI SERIES WITH KUB TECHNIQUE: After obtaining a scout radiograph a routine upper GI series was performed using thin barium FLUOROSCOPY TIME:  Fluoroscopy Time:  2 minutes 18 seconds Radiation Exposure Index (if provided by the fluoroscopic device): 31.2 mGy Number of Acquired Spot Images: 0 COMPARISON:  CT AP 04/19/2021. FINDINGS: The esophagus appears patent. No stricture or mass identified. There is a moderate size hiatal hernia. The stomach appears nondistended. Increased caliber of the duodenum is identified. Contrast however passes through the horizontal portion to the ligament of Treitz and into the mildly dilated proximal small bowel loops without signs of duodenal stricture. IMPRESSION: 1. No signs of significant duodenal narrowing/stricture. 2. Increased caliber of the duodenum is noted to the level of the ligament of Treitz. However, the proximal jejunal loops are  also mildly increased in caliber. Findings may reflect sequelae of mild low-grade partial small-bowel obstruction or proximal small bowel ileus. Electronically Signed   By: Kerby Moors M.D.   On: 04/20/2021 15:13   US Abdomen Limited RUQ (LIVER/GB)  Result Date: 04/19/2021 CLINICAL DATA:  Transaminitis EXAM: ULTRASOUND ABDOMEN LIMITED RIGHT UPPER QUADRANT COMPARISON:  CT 04/19/2021 FINDINGS: Gallbladder: Surgically absent Common bile duct: Diameter: 4.3 mm Liver: No focal lesion identified. Within normal limits in parenchymal echogenicity. Portal vein is patent on color Doppler imaging with normal direction of blood flow towards the liver. Other: Trace ascites adjacent to the liver IMPRESSION: Status post cholecystectomy.  Small amount of perihepatic ascites Electronically Signed   By: Donavan Foil M.D.   On: 04/19/2021 22:48        Scheduled Meds:  allopurinol  100 mg Oral q morning   apixaban  5 mg Oral BID   finasteride  5 mg Oral QHS   gabapentin  100 mg Oral TID   midodrine  15 mg Oral TID WC   pantoprazole  40 mg Oral q morning   Riociguat  1.5 mg Oral TID   sodium chloride flush  3 mL Intravenous Q12H   umeclidinium bromide  1 puff Inhalation QPM   Continuous Infusions:  sodium chloride       LOS: 2 days    Time spent: 39 minutes spent on chart review, discussion with nursing staff, consultants, updating family and interview/physical exam; more than 50% of that time was spent in counseling and/or coordination of care.    Ayo Guarino J [place] DO [facility] Available via Epic secure chat 7am-7pm After these hours, please refer to coverage provider listed on [email or web address] 04/21/2021, 10:38 AM

## 2021-04-21 NOTE — Progress Notes (Signed)
Pt has had multiple episodes of N/V with emesis output of 564m. Gave Zofran that was not effective. Notified MD and received order for Reglan. Pt reports "some" relief.   ARaelyn Number RN

## 2021-04-21 NOTE — Progress Notes (Signed)
Advanced Heart Failure Rounding Note  PCP-Cardiologist: Joshua Mocha, MD   Subjective:   Patient admitted 09/01 with N, V, hypotension and AKI on CKD after recent admission for a/c diastolic HF/RV failure. Gave IV fluids and Midodrine increased to 15 mg TID to support BP.  Nausea improved.   LFTs and renal function improving. SCr 3.7 -> 2.8   PCT 3.3 -> 2.1 Afebrile   CT abdomen pelvis 9/1 with abrupt narrowing third portion of duodenum as it passes between abdominal aorta and SMV with upstream dilatation suggestive of nutcracker phenomenon   Objective:   Weight Range: 100.3 kg Body mass index is 27.64 kg/m.   Vital Signs:   Temp:  [97.4 F (36.3 C)-98.2 F (36.8 C)] 97.6 F (36.4 C) (09/03 0752) Pulse Rate:  [74-91] 78 (09/03 0752) Resp:  [15-20] 16 (09/03 0752) BP: (91-101)/(56-65) 91/56 (09/03 0752) SpO2:  [89 %-97 %] 95 % (09/03 0752) Weight:  [100.3 kg] 100.3 kg (09/03 0019) Last BM Date: 04/18/21  Weight change: Filed Weights   04/19/21 1723 04/20/21 0417 04/21/21 0019  Weight: 98.3 kg 100.8 kg 100.3 kg    Intake/Output:   Intake/Output Summary (Last 24 hours) at 04/21/2021 1100 Last data filed at 04/21/2021 1034 Gross per 24 hour  Intake 663.78 ml  Output 1050 ml  Net -386.22 ml       Physical Exam    General:  Elderly male sitting up in bed No resp difficulty HEENT: normal Neck: supple. JVP to jaw  Carotids 2+ bilat; no bruits. No lymphadenopathy or thryomegaly appreciated. Cor: PMI nondisplaced. Regular rate & rhythm. 2/6 TR Lungs: clear Abdomen: obese soft, nontender, nondistended. No hepatosplenomegaly. No bruits or masses. Good bowel sounds. Extremities: no cyanosis, clubbing, rash, edema Neuro: alert & orientedx3, cranial nerves grossly intact. moves all 4 extremities w/o difficulty. Affect pleasant   Telemetry   NSR 70-80s Personally reviewed  Labs    CBC Recent Labs    04/19/21 0915 04/20/21 0119 04/21/21 0045  WBC 12.1*  8.8 4.7  NEUTROABS 10.8*  --   --   HGB 14.7 13.8 13.9  HCT 45.2 42.7 41.6  MCV 89.2 87.3 85.6  PLT 117* 112* 112*    Basic Metabolic Panel Recent Labs    04/19/21 1718 04/20/21 0119 04/21/21 0045  NA  --  134* 134*  K  --  4.6 3.8  CL  --  89* 95*  CO2  --  33* 31  GLUCOSE  --  78 71  BUN  --  50* 49*  CREATININE  --  3.70* 2.84*  CALCIUM  --  9.4 8.7*  MG 2.1  --   --     Liver Function Tests Recent Labs    04/20/21 0119 04/21/21 0045  AST 1,475* 737*  ALT 879* 707*  ALKPHOS 95 86  BILITOT 1.8* 1.8*  PROT 6.2* 5.7*  ALBUMIN 2.8* 2.6*    Recent Labs    04/19/21 0915  LIPASE 24    Cardiac Enzymes No results for input(s): CKTOTAL, CKMB, CKMBINDEX, TROPONINI in the last 72 hours.  BNP: BNP (last 3 results) Recent Labs    04/11/21 1047 04/12/21 1435 04/19/21 0915  BNP 1,737.2* 1,597.1* 2,618.0*     ProBNP (last 3 results) No results for input(s): PROBNP in the last 8760 hours.   D-Dimer No results for input(s): DDIMER in the last 72 hours. Hemoglobin A1C No results for input(s): HGBA1C in the last 72 hours. Fasting Lipid Panel No results  for input(s): CHOL, HDL, LDLCALC, TRIG, CHOLHDL, LDLDIRECT in the last 72 hours. Thyroid Function Tests No results for input(s): TSH, T4TOTAL, T3FREE, THYROIDAB in the last 72 hours.  Invalid input(s): FREET3  Other results:   Imaging    DG UGI W SINGLE CM (SOL OR THIN BA)  Result Date: 04/20/2021 CLINICAL DATA:  Evaluate for intestinal stricture.  Abnormal CT. EXAM: UPPER GI SERIES WITH KUB TECHNIQUE: After obtaining a scout radiograph a routine upper GI series was performed using thin barium FLUOROSCOPY TIME:  Fluoroscopy Time:  2 minutes 18 seconds Radiation Exposure Index (if provided by the fluoroscopic device): 31.2 mGy Number of Acquired Spot Images: 0 COMPARISON:  CT AP 04/19/2021. FINDINGS: The esophagus appears patent. No stricture or mass identified. There is a moderate size hiatal hernia. The  stomach appears nondistended. Increased caliber of the duodenum is identified. Contrast however passes through the horizontal portion to the ligament of Treitz and into the mildly dilated proximal small bowel loops without signs of duodenal stricture. IMPRESSION: 1. No signs of significant duodenal narrowing/stricture. 2. Increased caliber of the duodenum is noted to the level of the ligament of Treitz. However, the proximal jejunal loops are also mildly increased in caliber. Findings may reflect sequelae of mild low-grade partial small-bowel obstruction or proximal small bowel ileus. Electronically Signed   By: Kerby Moors M.D.   On: 04/20/2021 15:13     Medications:     Scheduled Medications:  allopurinol  100 mg Oral q morning   apixaban  5 mg Oral BID   finasteride  5 mg Oral QHS   gabapentin  100 mg Oral TID   midodrine  15 mg Oral TID WC   pantoprazole  40 mg Oral q morning   Riociguat  1.5 mg Oral TID   sodium chloride flush  3 mL Intravenous Q12H   umeclidinium bromide  1 puff Inhalation QPM    Infusions:  sodium chloride      PRN Medications: sodium chloride, albuterol, ipratropium, ondansetron **OR** ondansetron (ZOFRAN) IV, sodium chloride flush   Assessment/Plan   1. AKI on Stage III CKD: Baseline SCr ~1.6. Scr 1.47 at d/c on 08/30. Up to 3.69 on admit -> 2.84 today - likely due to hypovolemia and RV failure - given IVF. Diuretics held - continue midodrine 15 mg TID for BP support. BP better today [id number] 045W systolic. - continue to hold diuretics for at least one more day 2. Chronic HFpEF/RV failure: Echo (2/22) with EF 55-60%, IV septum flattened, severe RV enlargement, severely decreased RV function, PASP 57 mmHg.  RHC in 4/22 showed normal filling pressures. Admitted for a/c RV failure w/ markedly volume overload + poor response to home diuretics.  - he has end-stage RV failure - Holding diuretics due to overdiuresis.  - Midodrine increased to 15 mg TID to  support BP - Likely restart torsemide in next day or two - Will not restart Farxiga  - TED hose 3. Hyperkalemia: - Resolved.  - D/C home K supplement - K 3.8 today 4. Pulmonary HTN: PAH noted on 4/22 RHC with PVR 6.1 WU.  This appears to be multifactorial with OSA, emphysema, hypoxemia with ambulation, and a suspected chronic PE involving the right middle lobe (group 3 and group 4 PH). Given the suspected mixed etiology with only 1 area of chronic thromboembolism (right middle lobe) as well as age, do not think that pulmonary thromboendarterectomy would be indicated.  Rheumatologic serologic workup was negative.  PFTs showed severe obstruction  and moderate restriction, suggesting significant COPD. - Continue riociguat for CTEPH, 1.5 mg bid.  - O2 sats stable on 3L 02, on chronic 02 at home 5. Elevated troponin: - HS trop 247 > 499 > 717 > 893 - No s/s ischemic - Likely d/t demand ischemia from acute illness 6. Transaminitis/hock liver from hypotension - WNL on 08/25, now trending upwards.  - now improving - No significant abnormality identified on [place]. Small amount of perihepatic ascites 7. Duodenal abnormality: -CT abdomen 9/1with narrowing distal third duodenum with upstream dilatation.  - has been seen by [facility]. Not surgical candidate. Symptoms improved 8. OSA: untreated. Unable to tolerate CPAP  9. Emphysema: Prior smoker.  Emphysema on CT and severe obstruction on PFTs - He uses home oxygen  10. Orthostatic Hypotension  - c/w midodrine + leg compression + fluids 11. Chronic PE - on Eliquis 12. BPH with urinary retention: He has had issues with this in the past, often self caths 3-4x/day.  - On Flomax.  - Had outpatient f/u scheduled but missed d/t admission   Length of Stay: 2  Glori Bickers, MD  04/21/2021, 11:00 AM  Advanced Heart Failure Team Pager [phone] (M-F; 7a - 5p)  Please contact [facility] for night-coverage after hours (5p -7a ) and weekends on  [email or web address]

## 2021-04-22 ENCOUNTER — Inpatient Hospital Stay (HOSPITAL_COMMUNITY): Payer: Medicare Other

## 2021-04-22 DIAGNOSIS — R112 Nausea with vomiting, unspecified: Secondary | ICD-10-CM | POA: Diagnosis not present

## 2021-04-22 LAB — HEPATIC FUNCTION PANEL
ALT: 703 U/L — ABNORMAL HIGH (ref 0–44)
AST: 558 U/L — ABNORMAL HIGH (ref 15–41)
Albumin: 2.8 g/dL — ABNORMAL LOW (ref 3.5–5.0)
Alkaline Phosphatase: 92 U/L (ref 38–126)
Bilirubin, Direct: 1 mg/dL — ABNORMAL HIGH (ref 0.0–0.2)
Indirect Bilirubin: 1.6 mg/dL — ABNORMAL HIGH (ref 0.3–0.9)
Total Bilirubin: 2.6 mg/dL — ABNORMAL HIGH (ref 0.3–1.2)
Total Protein: 6.2 g/dL — ABNORMAL LOW (ref 6.5–8.1)

## 2021-04-22 LAB — BASIC METABOLIC PANEL
Anion gap: 10 (ref 5–15)
BUN: 38 mg/dL — ABNORMAL HIGH (ref 8–23)
CO2: 31 mmol/L (ref 22–32)
Calcium: 9.1 mg/dL (ref 8.9–10.3)
Chloride: 93 mmol/L — ABNORMAL LOW (ref 98–111)
Creatinine, Ser: 2.08 mg/dL — ABNORMAL HIGH (ref 0.61–1.24)
GFR, Estimated: 31 mL/min — ABNORMAL LOW (ref >60)
Glucose, Bld: 110 mg/dL — ABNORMAL HIGH (ref 70–99)
Potassium: 3.7 mmol/L (ref 3.5–5.1)
Sodium: 134 mmol/L — ABNORMAL LOW (ref 135–145)

## 2021-04-22 LAB — BRAIN NATRIURETIC PEPTIDE: B Natriuretic Peptide: 1156.1 pg/mL — ABNORMAL HIGH (ref 0.0–100.0)

## 2021-04-22 MED ORDER — GABAPENTIN 100 MG PO CAPS
200.0000 mg | ORAL_CAPSULE | Freq: Three times a day (TID) | ORAL | Status: DC
  Administered 2021-04-22 – 2021-04-30 (×24): 200 mg via ORAL
  Filled 2021-04-22 (×25): qty 2

## 2021-04-22 MED ORDER — TORSEMIDE 20 MG PO TABS
60.0000 mg | ORAL_TABLET | Freq: Two times a day (BID) | ORAL | Status: DC
  Administered 2021-04-22 – 2021-04-24 (×4): 60 mg via ORAL
  Filled 2021-04-22 (×4): qty 3

## 2021-04-22 NOTE — Progress Notes (Addendum)
Advanced Heart Failure Rounding Note  PCP-Cardiologist: Sherren Mocha, MD   Subjective:   Patient admitted 09/01 with N, V, hypotension and AKI on CKD after recent admission for a/c diastolic HF/RV failure. Gave IV fluids and Midodrine increased to 15 mg TID to support BP.  Diuretics remain on hold. SCr continues to improve.  3.7-> 2.8 -> 2.1. No weights recorded in cahrt today.   Denies SOB, orthopnea or PND. Wants to go home   Was hypoxic in 70-80s this am O2 increased.   CT abdomen pelvis 9/1 with abrupt narrowing third portion of duodenum as it passes between abdominal aorta and SMV with upstream dilatation suggestive of nutcracker phenomenon   Objective:   Weight Range: 100.3 kg Body mass index is 27.64 kg/m.   Vital Signs:   Temp:  [97.4 F (36.3 C)-98.6 F (37 C)] 97.4 F (36.3 C) (09/04 1144) Pulse Rate:  [90-108] 90 (09/04 1144) Resp:  [14-20] 14 (09/04 0824) BP: (91-112)/(56-73) 104/73 (09/04 1144) SpO2:  [83 %-94 %] 92 % (09/04 0824) Last BM Date: 04/18/21  Weight change: Filed Weights   04/19/21 1723 04/20/21 0417 04/21/21 0019  Weight: 98.3 kg 100.8 kg 100.3 kg    Intake/Output:   Intake/Output Summary (Last 24 hours) at 04/22/2021 1253 Last data filed at 04/22/2021 4097 Gross per 24 hour  Intake 240 ml  Output 1400 ml  Net -1160 ml       Physical Exam    General:  Elderly male sitting up in chair No resp difficulty HEENT: normal Neck: supple. JVP 6 Carotids 2+ bilat; no bruits. No lymphadenopathy or thryomegaly appreciated. Cor: PMI nondisplaced. Regular rate & rhythm. No rubs, gallops or murmurs. Lungs: clear Abdomen: soft, nontender, nondistended. No hepatosplenomegaly. No bruits or masses. Good bowel sounds. Extremities: no cyanosis, clubbing, rash, edema Neuro: alert & orientedx3, cranial nerves grossly intact. moves all 4 extremities w/o difficulty. Affect pleasant   Telemetry   NSR 70-80s Personally reviewed  Labs     CBC Recent Labs    04/20/21 0119 04/21/21 0045  WBC 8.8 4.7  HGB 13.8 13.9  HCT 42.7 41.6  MCV 87.3 85.6  PLT 112* 112*    Basic Metabolic Panel Recent Labs    04/19/21 1718 04/20/21 0119 04/21/21 0045 04/22/21 0100  NA  --    < > 134* 134*  K  --    < > 3.8 3.7  CL  --    < > 95* 93*  CO2  --    < > 31 31  GLUCOSE  --    < > 71 110*  BUN  --    < > 49* 38*  CREATININE  --    < > 2.84* 2.08*  CALCIUM  --    < > 8.7* 9.1  MG 2.1  --   --   --    < > = values in this interval not displayed.    Liver Function Tests Recent Labs    04/21/21 0045 04/22/21 0100  AST 737* 558*  ALT 707* 703*  ALKPHOS 86 92  BILITOT 1.8* 2.6*  PROT 5.7* 6.2*  ALBUMIN 2.6* 2.8*    No results for input(s): LIPASE, AMYLASE in the last 72 hours.  Cardiac Enzymes No results for input(s): CKTOTAL, CKMB, CKMBINDEX, TROPONINI in the last 72 hours.  BNP: BNP (last 3 results) Recent Labs    04/12/21 1435 04/19/21 0915 04/22/21 0958  BNP 1,597.1* 2,618.0* 1,156.1*     ProBNP (  last 3 results) No results for input(s): PROBNP in the last 8760 hours.   D-Dimer No results for input(s): DDIMER in the last 72 hours. Hemoglobin A1C No results for input(s): HGBA1C in the last 72 hours. Fasting Lipid Panel No results for input(s): CHOL, HDL, LDLCALC, TRIG, CHOLHDL, LDLDIRECT in the last 72 hours. Thyroid Function Tests No results for input(s): TSH, T4TOTAL, T3FREE, THYROIDAB in the last 72 hours.  Invalid input(s): FREET3  Other results:   Imaging    DG CHEST PORT 1 VIEW  Result Date: 04/22/2021 CLINICAL DATA:  Patient with weakness.  Shortness of breath. EXAM: PORTABLE CHEST 1 VIEW COMPARISON:  Chest radiograph 04/19/2021 FINDINGS: Monitoring leads overlie the patient. Cardiomegaly. Interval development of diffuse bilateral airspace opacities. No pleural effusion or pneumothorax. IMPRESSION: New diffuse bilateral airspace opacities may represent edema or multifocal infection.  Electronically Signed   By: Lovey Newcomer M.D.   On: 04/22/2021 11:49     Medications:     Scheduled Medications:  allopurinol  100 mg Oral q morning   apixaban  5 mg Oral BID   finasteride  5 mg Oral QHS   gabapentin  200 mg Oral TID   midodrine  15 mg Oral TID WC   pantoprazole  40 mg Oral q morning   Riociguat  1.5 mg Oral TID   sodium chloride flush  3 mL Intravenous Q12H   umeclidinium bromide  1 puff Inhalation QPM    Infusions:  sodium chloride      PRN Medications: sodium chloride, albuterol, ipratropium, metoCLOPramide (REGLAN) injection, ondansetron **OR** ondansetron (ZOFRAN) IV, sodium chloride flush   Assessment/Plan   1. AKI on Stage III CKD: Baseline SCr ~1.6. Scr 1.47 at d/c on 08/30. Up to 3.69 on admit -> 2.08 today - likely due to hypovolemia and RV failure - given IVF. Diuretics held - continue midodrine 15 mg TID for BP support. BP better mostly low 322G systolic. - Volume status looks ok. SCr improving - Will restart torsemide at 60 bid. (Was on 80/60 and Farxiga 10 PTA) 2. Chronic HFpEF/RV failure: Echo (2/22) with EF 55-60%, IV septum flattened, severe RV enlargement, severely decreased RV function, PASP 57 mmHg.  RHC in 4/22 showed normal filling pressures. Admitted for a/c RV failure w/ markedly volume overload + poor response to home diuretics.  - he has end-stage RV failure - Diuretics have been on hold due to overdiuresis.  - Midodrine increased to 15 mg TID to support BP - Restart torsemide as above - Will not restart [place]  - TED hose - I gave him an incentive spirometer today. 3. Hyperkalemia: - Resolved.  - D/C home K supplement - K 3.7 today 4. Pulmonary HTN: PAH noted on 4/22 RHC with PVR 6.1 WU.  This appears to be multifactorial with OSA, emphysema, hypoxemia with ambulation, and a suspected chronic PE involving the right middle lobe (group 3 and group 4 PH). Given the suspected mixed etiology with only 1 area of chronic  thromboembolism (right middle lobe) as well as age, do not think that pulmonary thromboendarterectomy would be indicated.  Rheumatologic serologic workup was negative.  PFTs showed severe obstruction and moderate restriction, suggesting significant COPD. - Continue riociguat for CTEPH, 1.5 mg bid.  - O2 sats stable on 3L 02, on chronic 02 at home 5. Elevated troponin: - HS trop 247 > 499 > 717 > 893 - No s/s ischemia - Likely d/t demand ischemia from acute illness 6. Transaminitis/hock liver from hypotension -  WNL on 08/25, now trending upwards.  - now improving - No significant abnormality identified on [place]. Small amount of perihepatic ascites 7. Duodenal abnormality: -CT abdomen 9/1with narrowing distal third duodenum with upstream dilatation.  - has been seen by [facility]. Not surgical candidate. Symptoms improved 8. OSA: untreated. Unable to tolerate CPAP  9. Emphysema: Prior smoker.  Emphysema on CT and severe obstruction on PFTs - He uses home oxygen  10. Orthostatic Hypotension  - c/w midodrine + leg compression + fluids 11. Chronic PE - on Eliquis 12. BPH with urinary retention: He has had issues with this in the past, often self caths 3-4x/day.  - On Flomax.  - Had outpatient f/u scheduled but missed d/t admission   Length of Stay: 3  Glori Bickers, MD  04/22/2021, 12:53 PM  Advanced Heart Failure Team Pager [phone] (M-F; 7a - 5p)  Please contact [facility] for night-coverage after hours (5p -7a ) and weekends on [email or web address]

## 2021-04-22 NOTE — Progress Notes (Signed)
Mobility Specialist: Progress Note   04/22/21 1315  Mobility  Activity Ambulated in hall  Level of Assistance Minimal assist, patient does 75% or more  Assistive Device Front wheel walker  Distance Ambulated (ft) 160 ft  Mobility Ambulated with assistance in hallway  Mobility Response Tolerated fair  Mobility performed by Mobility specialist  Bed Position Chair  $Mobility charge 1 Mobility   Pre-Mobility: 88 HR, 114/78 BP, 94% SpO2 During Mobility: 173 HR, 88-90% SpO2 Post-Mobility: 89 HR, 110/61 BP, 91% SpO2  Pt mod independent with bed mobility and required minA to stand from EOB. Pt asx throughout ambulation. Pt's HR peaked at 173 bpm but rhythm did not look good and pt's HR returned to 110 bpm within 10 seconds of standing break with normal rhythm, RN notified. Pt to recliner after walk per request with call bell at his side and family member present in the room.   [facility] Joshua [name] Mobility Specialist Mobility Specialist Phone: [phone]

## 2021-04-22 NOTE — Progress Notes (Signed)
Pts O2 sats were sustaining in upper 70s to low 80s on 3L, increased to 4L with no improvement. Pt was lethargic and sleeping with mouth open. Called RT. O2 increased to 6L with bubbler. O2 maintaining between 88-92%  Raelyn Number, RN

## 2021-04-22 NOTE — Progress Notes (Signed)
PROGRESS NOTE    Joshua [name]  ZOX:[id number] DOB: 1936/02/28 DOA: 04/19/2021 PCP: [name], DO    Brief Narrative:  Joshua [name] is an 85 year old male with past medical history significant for chronic diastolic CHF, chronic RV failure, hypotension on midodrine, COPD on 3 L nasal cannula at baseline, BPH, CKD stage IIIa, PE on Eliquis who presented to [facility] ED on 9/1 with intractable nausea/vomiting.  Patient reports onset Tuesday night with at least 20 episodes of emesis on Wednesday.  Denies hematemesis and no associated abdominal pain.  Patient also reports decreased urine output, self caths about 3 times a day.  Denies any dysuria, or color change within his urine.  Further denies fevers, chills, shortness of breath, coughing, no chest pain/palpitations, no diarrhea/constipation.  Denies any increased leg swelling.  Recently discharged on 8/30 for CHF exacerbation.  Was initially started on midodrine and [place].  In the ED, temperature 99.1 F, HR 87, RR 20, BP 61/41, SPO2 93% on 3 L nasal cannula.  Sodium 135, potassium 5.5, chloride 89, CO2 29, glucose 117, BUN 41, creatinine 3.69.  WBC 12.1, hemoglobin 1214.7, platelets 117.  Lipase 24, AST 416, ALT 197.  BNP 2618.  High sensitive troponin 247>499.  Chest x-ray with no focal opacity, increased interstitial coarsening/mild edema.  EKG with normal sinus rhythm, rate 83, QTc 495.  CT abdomen/pelvis without contrast with abrupt narrowing third portion of duodenum between aneurysmal abdominal aorta or and SMV with upstream dilation suggestive of nutcracker phenomenon, no evidence of high-grade bowel obstruction, infrarenal abdominal aortic aneurysm 3.5 cm, moderate hiatal hernia, thickened bladder, cardiomegaly, small ventral abdominal hernia containing a loop of nonobstructed small bowel.  Cardiology and general surgery were consulted.  Duration consulted for further evaluation and management of intractable nausea vomiting, hypotension,  elevated troponin, transaminitis.   Assessment & Plan:   Principal Problem:   Intractable nausea and vomiting Active Problems:   History of pulmonary embolism   Chronic respiratory failure with hypoxia (HCC)   COPD (chronic obstructive pulmonary disease) (HCC)   BPH with urinary obstruction   Chronic right-sided heart failure (HCC)   Acute renal failure superimposed on stage 3 chronic kidney disease (HCC)   Hyperkalemia   Transaminitis   Elevated troponin   Duodenal anomaly   Intractable nausea/vomiting Duodenal stenosis Patient presenting to ED with persistent intractable nausea/vomiting.  CT abdomen/pelvis notable for abrupt narrowing third portion of duodenum without high-grade obstruction; concern for possible nutcracker phenomenon.  Denies any abdominal discomfort.  Upper GI series with no signs of significant duodenal narrowing/stricture.  General surgery now signed off. --soft diet --Zofran/Reglan as needed  Transaminitis: Improving These are new findings with LFTs that were normal on 8/25.  No history of cirrhosis or portal hypertension's.  No abnormal findings noted on CT abdomen/pelvis other than some scattered ascites.  Acute hepatitis panel negative.  High suspicion from shock liver with significant hypotension. --AST [phone] (20 on 8/27) --ALT 197>879>707>703 (12 on 8/27) --Avoid hepatotoxins, especially as Tylenol/statins --monitor LFTs daily  Hypotension Patient recently admitted and discharged following CHF exacerbation with noted hypotension with recent started on midodrine.  Patient was noted to have significant hypotension on arrival with SBP 61. --Midodrine increased to 15 mg p.o. 3 times daily --Continue monitor BP closely  Elevated troponin High sensitive troponin [phone].  Etiology likely secondary to demand ischemia from acute illness/hypotension.  Denies chest pain. --Cardiology following, continue to monitor on telemetry  Acute  renal failure on CKD stage IIIa: Improving On  arrival, patient's creatinine elevated at 3.87.  Baseline Cr 1.5.  Etiology likely secondary to ATN from significant hypotension versus prerenal azotemia from dehydration with intractable nausea vomiting; also concern for cardiorenal syndrome. FeNa = 0.8%; which is more consistent with a prerenal azotemia. --Cr 3.87>3.70>2.84>2.08 --IV fluids now discontinued --Avoid nephrotoxins, renal dose all medications --BMP daily  Hyperkalemia: Resolved Potassium elevated at 6.2 on admission.  Patient was given NovoLog, dextrose and Lokelma.  Etiology likely secondary to acute renal failure as above vs home K supplement. --K 6.2>>4.6> 3.8>3.7 --Home potassium supplement has been discontinued --Monitor on telemetry --Follow BMP daily  Chronic diastolic congestive heart failure Pulmonary hypertension Chronic right-sided heart failure TTE 09/2020 with LVEF 55-60%, fairly reduced RV systolic function, RV severely enlarged, moderate pulmonary artery systolic pressure.  Recently hospitalized and discharged on 8/30 for CHF exacerbation, with 20 pound weight loss.  At baseline on torsemide 80 mg p.o. every morning and 6 mg p.o. every afternoon. --Cardiology following, appreciate assistance --restarting torsemide 74m BID today --Strict I's and O's and daily weights  COPD, oxygen dependent Chronic hypoxic respiratory failure At baseline on 3 L nasal cannula.  PFTs with severe obstruction and moderate restriction on 6/22.  Not in acute exacerbation. --Incruse Ellipta 62.5 mcg 1 puff daily --Albuterol neb every 4 hours as needed shortness of breath/wheezing --Continue supplemental oxygen, maintain SPO2 greater than 88%.  Hx pulmonary embolism --Eliquis 5 mg p.o. twice daily  Hx CTEPH --Riociguat 1.536mBID  Hx OSA --Intolerant to CPAP  Infrarenal abdominal aortic aneurysm Measuring up to 3.5 cm on imaging.  Recommend repeat ultrasound every 2  years.  GERD: Protonix 40 mg p.o. daily  BPH/chronic urinary retention Patient reports self catheterize up to 3 times daily. --Finasteride 5 mg p.o. nightly --Continued home Cardura 2/2 hypotension   DVT prophylaxis: Place TED hose Start: 04/19/21 1341 apixaban (ELIQUIS) tablet 5 mg    Code Status: Full Code Family Communication: Updated family present at bedside this morning  Disposition Plan:  Level of care: Progressive Status is: Inpatient  Remains inpatient appropriate because:Ongoing diagnostic testing needed not appropriate for outpatient work up, Unsafe d/c plan, IV treatments appropriate due to intensity of illness or inability to take PO, and Inpatient level of care appropriate due to severity of illness  Dispo: The patient is from: Home              Anticipated d/c is to: Home              Patient currently is not medically stable to d/c.   Difficult to place patient No   Consultants:  Cardiology/heart failure team, Dr. McAundra DubinDr. BeHaroldine Lawseneral surgery, Dr. TsGeorgette Dover signed off 9/3  Procedures:  None  Antimicrobials:  None   Subjective: Patient seen examined at bedside, resting comfortably.  Overnight, oxygen demand has increased up to 6 L nasal cannula.  Also with nausea/vomiting yesterday afternoon, improved after starting Reglan.  No other specific complaints this morning.  Oxygen now down to 4 L nasal cannula this afternoon.  Discussed with Dr. BeHaroldine Lawsstarting diuretics back today with torsemide 60 mg twice daily.  Patient with no other specific complaints this morning.  Family present at bedside and updated.  Renal function and transaminitis both continue to improve.  Currently denies headache, no chest pain, no palpitations, no shortness of breath more than his normal baseline, no abdominal pain, no weakness, no fatigue, no active nausea/vomiting, no diarrhea, no weakness, no fatigue, no paresthesias.  No acute  events overnight per nursing  staff.  Objective: Vitals:   04/22/21 0409 04/22/21 0759 04/22/21 0824 04/22/21 1144  BP: 100/66  103/62 104/73  Pulse: 94 92 93 90  Resp: 20  14   Temp: 98.6 F (37 C)  98.3 F (36.8 C) (!) 97.4 F (36.3 C)  TempSrc: Oral  Oral Oral  SpO2: (!) 83% 91% 92%   Weight:      Height:        Intake/Output Summary (Last 24 hours) at 04/22/2021 1334 Last data filed at 04/22/2021 8657 Gross per 24 hour  Intake --  Output 1150 ml  Net -1150 ml   Filed Weights   04/19/21 1723 04/20/21 0417 04/21/21 0019  Weight: 98.3 kg 100.8 kg 100.3 kg    Examination:  General exam: Appears calm and comfortable, elderly/chronically ill in appearance Respiratory system: Clear to auscultation. Respiratory effort normal.  On 4 L nasal cannula  Cardiovascular system: S1 & S2 heard, RRR. No JVD, murmurs, rubs, gallops or clicks. No pedal edema. Gastrointestinal system: Abdomen is nondistended, soft and nontender. No organomegaly or masses felt. Normal bowel sounds heard. Central nervous system: Alert and oriented. No focal neurological deficits. Extremities: Symmetric 5 x 5 power. Skin: No rashes, lesions or ulcers Psychiatry: Judgement and insight appear normal. Mood & affect appropriate.     Data Reviewed: I have personally reviewed following labs and imaging studies  CBC: Recent Labs  Lab 04/16/21 0131 04/17/21 0210 04/19/21 0915 04/20/21 0119 04/21/21 0045  WBC 4.0 4.5 12.1* 8.8 4.7  NEUTROABS  --  2.8 10.8*  --   --   HGB 14.8 14.7 14.7 13.8 13.9  HCT 44.4 44.2 45.2 42.7 41.6  MCV 85.4 86.0 89.2 87.3 85.6  PLT 153 161 117* 112* 846*   Basic Metabolic Panel: Recent Labs  Lab 04/16/21 0131 04/17/21 0210 04/19/21 0915 04/19/21 1255 04/19/21 1713 04/19/21 1718 04/20/21 0119 04/21/21 0045 04/22/21 0100  NA 136 136   < > 134* 133*  --  134* 134* 134*  K 3.7 3.6   < > 6.2* 4.8  --  4.6 3.8 3.7  CL 93* 90*   < > 89* 90*  --  89* 95* 93*  CO2 36* 38*   < > 34* 34*  --  33* 31 31   GLUCOSE 95 94   < > 103* 71  --  78 71 110*  BUN 19 22   < > 44* 45*  --  50* 49* 38*  CREATININE 1.39* 1.47*   < > 3.74* 3.87*  --  3.70* 2.84* 2.08*  CALCIUM 9.1 9.1   < > 9.5 9.5  --  9.4 8.7* 9.1  MG 1.8 1.7  --   --   --  2.1  --   --   --    < > = values in this interval not displayed.   GFR: Estimated Creatinine Clearance: 32.2 mL/min (A) (by C-G formula based on SCr of 2.08 mg/dL (H)). Liver Function Tests: Recent Labs  Lab 04/19/21 0915 04/20/21 0119 04/21/21 0045 04/22/21 0100  AST 416* 1,475* 737* 558*  ALT 197* 879* 707* 703*  ALKPHOS 107 95 86 92  BILITOT 2.3* 1.8* 1.8* 2.6*  PROT 6.8 6.2* 5.7* 6.2*  ALBUMIN 3.1* 2.8* 2.6* 2.8*   Recent Labs  Lab 04/19/21 0915  LIPASE 24   No results for input(s): AMMONIA in the last 168 hours. Coagulation Profile: No results for input(s): INR, PROTIME in the last 168  hours. Cardiac Enzymes: No results for input(s): CKTOTAL, CKMB, CKMBINDEX, TROPONINI in the last 168 hours. BNP (last 3 results) No results for input(s): PROBNP in the last 8760 hours. HbA1C: No results for input(s): HGBA1C in the last 72 hours. CBG: Recent Labs  Lab 04/19/21 1506  GLUCAP 92   Lipid Profile: No results for input(s): CHOL, HDL, LDLCALC, TRIG, CHOLHDL, LDLDIRECT in the last 72 hours. Thyroid Function Tests: No results for input(s): TSH, T4TOTAL, FREET4, T3FREE, THYROIDAB in the last 72 hours. Anemia Panel: No results for input(s): VITAMINB12, FOLATE, FERRITIN, TIBC, IRON, RETICCTPCT in the last 72 hours. Sepsis Labs: Recent Labs  Lab 04/20/21 0119 04/21/21 0045  PROCALCITON 3.35 2.11    Recent Results (from the past 240 hour(s))  Resp Panel by RT-PCR (Flu A&B, Covid) Nasopharyngeal Swab     Status: None   Collection Time: 04/12/21  3:45 PM   Specimen: Nasopharyngeal Swab; Nasopharyngeal(NP) swabs in vial transport medium  Result Value Ref Range Status   SARS Coronavirus 2 by RT PCR NEGATIVE NEGATIVE Final    Comment:  (NOTE) SARS-CoV-2 target nucleic acids are NOT DETECTED.  The SARS-CoV-2 RNA is generally detectable in upper respiratory specimens during the acute phase of infection. The lowest concentration of SARS-CoV-2 viral copies this assay can detect is 138 copies/mL. A negative result does not preclude SARS-Cov-2 infection and should not be used as the sole basis for treatment or other patient management decisions. A negative result may occur with  improper specimen collection/handling, submission of specimen other than nasopharyngeal swab, presence of viral mutation(s) within the areas targeted by this assay, and inadequate number of viral copies(<138 copies/mL). A negative result must be combined with clinical observations, patient history, and epidemiological information. The expected result is Negative.  Fact Sheet for Patients:  EntrepreneurPulse.com.au  Fact Sheet for Healthcare Providers:  IncredibleEmployment.be  This test is no t yet approved or cleared by the [place] FDA and  has been authorized for detection and/or diagnosis of SARS-CoV-2 by FDA under an Emergency Use Authorization (EUA). This EUA will remain  in effect (meaning this test can be used) for the duration of the COVID-19 declaration under Section 564(b)(1) of the Act, 21 U.S.C.section 360bbb-3(b)(1), unless the authorization is terminated  or revoked sooner.       Influenza A by PCR NEGATIVE NEGATIVE Final   Influenza B by PCR NEGATIVE NEGATIVE Final    Comment: (NOTE) The Xpert Xpress SARS-CoV-2/FLU/RSV plus assay is intended as an aid in the diagnosis of influenza from Nasopharyngeal swab specimens and should not be used as a sole basis for treatment. Nasal washings and aspirates are unacceptable for Xpert Xpress SARS-CoV-2/FLU/RSV testing.  Fact Sheet for Patients: EntrepreneurPulse.com.au  Fact Sheet for Healthcare  Providers: IncredibleEmployment.be  This test is not yet approved or cleared by the [place] FDA and has been authorized for detection and/or diagnosis of SARS-CoV-2 by FDA under an Emergency Use Authorization (EUA). This EUA will remain in effect (meaning this test can be used) for the duration of the COVID-19 declaration under Section 564(b)(1) of the Act, 21 U.S.C. section 360bbb-3(b)(1), unless the authorization is terminated or revoked.  Performed at [facility], [place] [address] [place], [place] [address]   Resp Panel by RT-PCR (Flu A&B, Covid) Nasopharyngeal Swab     Status: None   Collection Time: 04/19/21  9:34 AM   Specimen: Nasopharyngeal Swab; Nasopharyngeal(NP) swabs in vial transport medium  Result Value Ref Range Status   SARS Coronavirus  2 by RT PCR NEGATIVE NEGATIVE Final    Comment: (NOTE) SARS-CoV-2 target nucleic acids are NOT DETECTED.  The SARS-CoV-2 RNA is generally detectable in upper respiratory specimens during the acute phase of infection. The lowest concentration of SARS-CoV-2 viral copies this assay can detect is 138 copies/mL. A negative result does not preclude SARS-Cov-2 infection and should not be used as the sole basis for treatment or other patient management decisions. A negative result may occur with  improper specimen collection/handling, submission of specimen other than nasopharyngeal swab, presence of viral mutation(s) within the areas targeted by this assay, and inadequate number of viral copies(<138 copies/mL). A negative result must be combined with clinical observations, patient history, and epidemiological information. The expected result is Negative.  Fact Sheet for Patients:  EntrepreneurPulse.com.au  Fact Sheet for Healthcare Providers:  IncredibleEmployment.be  This test is no t yet approved or cleared by the [place] FDA and  has been authorized for  detection and/or diagnosis of SARS-CoV-2 by FDA under an Emergency Use Authorization (EUA). This EUA will remain  in effect (meaning this test can be used) for the duration of the COVID-19 declaration under Section 564(b)(1) of the Act, 21 U.S.C.section 360bbb-3(b)(1), unless the authorization is terminated  or revoked sooner.       Influenza A by PCR NEGATIVE NEGATIVE Final   Influenza B by PCR NEGATIVE NEGATIVE Final    Comment: (NOTE) The Xpert Xpress SARS-CoV-2/FLU/RSV plus assay is intended as an aid in the diagnosis of influenza from Nasopharyngeal swab specimens and should not be used as a sole basis for treatment. Nasal washings and aspirates are unacceptable for Xpert Xpress SARS-CoV-2/FLU/RSV testing.  Fact Sheet for Patients: EntrepreneurPulse.com.au  Fact Sheet for Healthcare Providers: IncredibleEmployment.be  This test is not yet approved or cleared by the [place] FDA and has been authorized for detection and/or diagnosis of SARS-CoV-2 by FDA under an Emergency Use Authorization (EUA). This EUA will remain in effect (meaning this test can be used) for the duration of the COVID-19 declaration under Section 564(b)(1) of the Act, 21 U.S.C. section 360bbb-3(b)(1), unless the authorization is terminated or revoked.  Performed at [facility], [place] [address] [place], [place] [address]   Culture, blood (routine x 2)     Status: None (Preliminary result)   Collection Time: 04/20/21 11:50 AM   Specimen: BLOOD RIGHT ARM  Result Value Ref Range Status   Specimen Description BLOOD RIGHT ARM  Final   Special Requests   Final    BOTTLES DRAWN AEROBIC AND ANAEROBIC Blood Culture adequate volume   Culture   Final    NO GROWTH 2 DAYS Performed at [facility], [place] [address] [place], [place] [address]    Report Status PENDING  Incomplete  Culture, blood (routine x 2)     Status: None (Preliminary result)   Collection  Time: 04/20/21 11:56 AM   Specimen: BLOOD LEFT HAND  Result Value Ref Range Status   Specimen Description BLOOD LEFT HAND  Final   Special Requests   Final    BOTTLES DRAWN AEROBIC ONLY Blood Culture results may not be optimal due to an inadequate volume of blood received in culture bottles   Culture   Final    NO GROWTH 2 DAYS Performed at [facility], [place] [address] [place], [place] [address]    Report Status PENDING  Incomplete         Radiology Studies: DG CHEST PORT 1 VIEW  Result Date: 04/22/2021 CLINICAL DATA:  Patient with weakness.  Shortness of breath. EXAM: PORTABLE CHEST 1 VIEW COMPARISON:  Chest radiograph 04/19/2021 FINDINGS: Monitoring leads overlie the patient. Cardiomegaly. Interval development of diffuse bilateral airspace opacities. No pleural effusion or pneumothorax. IMPRESSION: New diffuse bilateral airspace opacities may represent edema or multifocal infection. Electronically Signed   By: Lovey Newcomer M.D.   On: 04/22/2021 11:49   DG UGI W SINGLE CM (SOL OR THIN BA)  Result Date: 04/20/2021 CLINICAL DATA:  Evaluate for intestinal stricture.  Abnormal CT. EXAM: UPPER GI SERIES WITH KUB TECHNIQUE: After obtaining a scout radiograph a routine upper GI series was performed using thin barium FLUOROSCOPY TIME:  Fluoroscopy Time:  2 minutes 18 seconds Radiation Exposure Index (if provided by the fluoroscopic device): 31.2 mGy Number of Acquired Spot Images: 0 COMPARISON:  CT AP 04/19/2021. FINDINGS: The esophagus appears patent. No stricture or mass identified. There is a moderate size hiatal hernia. The stomach appears nondistended. Increased caliber of the duodenum is identified. Contrast however passes through the horizontal portion to the ligament of Treitz and into the mildly dilated proximal small bowel loops without signs of duodenal stricture. IMPRESSION: 1. No signs of significant duodenal narrowing/stricture. 2. Increased caliber of the duodenum is noted to the  level of the ligament of Treitz. However, the proximal jejunal loops are also mildly increased in caliber. Findings may reflect sequelae of mild low-grade partial small-bowel obstruction or proximal small bowel ileus. Electronically Signed   By: Kerby Moors M.D.   On: 04/20/2021 15:13        Scheduled Meds:  allopurinol  100 mg Oral q morning   apixaban  5 mg Oral BID   finasteride  5 mg Oral QHS   gabapentin  200 mg Oral TID   midodrine  15 mg Oral TID WC   pantoprazole  40 mg Oral q morning   Riociguat  1.5 mg Oral TID   sodium chloride flush  3 mL Intravenous Q12H   torsemide  60 mg Oral BID   umeclidinium bromide  1 puff Inhalation QPM   Continuous Infusions:  sodium chloride       LOS: 3 days    Time spent: 38 minutes spent on chart review, discussion with nursing staff, consultants, updating family and interview/physical exam; more than 50% of that time was spent in counseling and/or coordination of care.    Joshua Kemmerer J [place] DO [facility] Available via Epic secure chat 7am-7pm After these hours, please refer to coverage provider listed on [email or web address] 04/22/2021, 1:34 PM

## 2021-04-23 DIAGNOSIS — R112 Nausea with vomiting, unspecified: Secondary | ICD-10-CM | POA: Diagnosis not present

## 2021-04-23 DIAGNOSIS — I50812 Chronic right heart failure: Secondary | ICD-10-CM | POA: Diagnosis not present

## 2021-04-23 LAB — BASIC METABOLIC PANEL
Anion gap: 11 (ref 5–15)
BUN: 26 mg/dL — ABNORMAL HIGH (ref 8–23)
CO2: 33 mmol/L — ABNORMAL HIGH (ref 22–32)
Calcium: 9.4 mg/dL (ref 8.9–10.3)
Chloride: 93 mmol/L — ABNORMAL LOW (ref 98–111)
Creatinine, Ser: 1.58 mg/dL — ABNORMAL HIGH (ref 0.61–1.24)
GFR, Estimated: 43 mL/min — ABNORMAL LOW (ref >60)
Glucose, Bld: 89 mg/dL (ref 70–99)
Potassium: 3.5 mmol/L (ref 3.5–5.1)
Sodium: 137 mmol/L (ref 135–145)

## 2021-04-23 LAB — HEPATIC FUNCTION PANEL
ALT: 584 U/L — ABNORMAL HIGH (ref 0–44)
AST: 347 U/L — ABNORMAL HIGH (ref 15–41)
Albumin: 2.9 g/dL — ABNORMAL LOW (ref 3.5–5.0)
Alkaline Phosphatase: 86 U/L (ref 38–126)
Bilirubin, Direct: 1.2 mg/dL — ABNORMAL HIGH (ref 0.0–0.2)
Indirect Bilirubin: 1.3 mg/dL — ABNORMAL HIGH (ref 0.3–0.9)
Total Bilirubin: 2.5 mg/dL — ABNORMAL HIGH (ref 0.3–1.2)
Total Protein: 6.4 g/dL — ABNORMAL LOW (ref 6.5–8.1)

## 2021-04-23 MED ORDER — DIPHENHYDRAMINE HCL 50 MG/ML IJ SOLN
25.0000 mg | Freq: Once | INTRAMUSCULAR | Status: AC
  Administered 2021-04-23: 25 mg via INTRAVENOUS
  Filled 2021-04-23: qty 1

## 2021-04-23 MED ORDER — POTASSIUM CHLORIDE CRYS ER 20 MEQ PO TBCR
40.0000 meq | EXTENDED_RELEASE_TABLET | Freq: Once | ORAL | Status: AC
  Administered 2021-04-23: 40 meq via ORAL
  Filled 2021-04-23: qty 2

## 2021-04-23 NOTE — Care Management Important Message (Signed)
Important Message  Patient Details  Name: Joshua [name] MRN: [id number] Date of Birth: [birth year]   Medicare Important Message Given:  Yes     [name] 04/23/2021, 8:56 AM

## 2021-04-23 NOTE — Progress Notes (Signed)
PROGRESS NOTE    Joshua [name]  QIO:[id number] DOB: [birth year] DOA: 04/19/2021 PCP: [name], DO    Brief Narrative:  Joshua [name] is an 85 year old male with past medical history significant for chronic diastolic CHF, chronic RV failure, hypotension on midodrine, COPD on 3 L nasal cannula at baseline, BPH, CKD stage IIIa, PE on Eliquis who presented to [facility] ED on 9/1 with intractable nausea/vomiting.  Patient reports onset Tuesday night with at least 20 episodes of emesis on Wednesday.  Denies hematemesis and no associated abdominal pain.  Patient also reports decreased urine output, self caths about 3 times a day.  Denies any dysuria, or color change within his urine.  Further denies fevers, chills, shortness of breath, coughing, no chest pain/palpitations, no diarrhea/constipation.  Denies any increased leg swelling.  Recently discharged on 8/30 for CHF exacerbation.  Was initially started on midodrine and [place].  In the ED, temperature 99.1 F, HR 87, RR 20, BP 61/41, SPO2 93% on 3 L nasal cannula.  Sodium 135, potassium 5.5, chloride 89, CO2 29, glucose 117, BUN 41, creatinine 3.69.  WBC 12.1, hemoglobin 1214.7, platelets 117.  Lipase 24, AST 416, ALT 197.  BNP 2618.  High sensitive troponin 247>499.  Chest x-ray with no focal opacity, increased interstitial coarsening/mild edema.  EKG with normal sinus rhythm, rate 83, QTc 495.  CT abdomen/pelvis without contrast with abrupt narrowing third portion of duodenum between aneurysmal abdominal aorta or and SMV with upstream dilation suggestive of nutcracker phenomenon, no evidence of high-grade bowel obstruction, infrarenal abdominal aortic aneurysm 3.5 cm, moderate hiatal hernia, thickened bladder, cardiomegaly, small ventral abdominal hernia containing a loop of nonobstructed small bowel.  Cardiology and general surgery were consulted.  Duration consulted for further evaluation and management of intractable nausea vomiting, hypotension,  elevated troponin, transaminitis.   Assessment & Plan:   Principal Problem:   Intractable nausea and vomiting Active Problems:   History of pulmonary embolism   Chronic respiratory failure with hypoxia (HCC)   COPD (chronic obstructive pulmonary disease) (HCC)   BPH with urinary obstruction   Chronic right-sided heart failure (HCC)   Acute renal failure superimposed on stage 3 chronic kidney disease (HCC)   Hyperkalemia   Transaminitis   Elevated troponin   Duodenal anomaly   Intractable nausea/vomiting Duodenal stenosis Intractable hiccups Patient presenting to ED with persistent intractable nausea/vomiting.  CT abdomen/pelvis notable for abrupt narrowing third portion of duodenum without high-grade obstruction; concern for possible nutcracker phenomenon.  Denies any abdominal discomfort.  Upper GI series with no signs of significant duodenal narrowing/stricture.  General surgery now signed off. --soft diet --Zofran/Reglan as needed --Would avoid Thorazine given high risk of tardive dyskinesia in this elderly gentleman as well as muscle relaxants that would cause significant somnolence; patient not interested in medications that cause drowsiness  Transaminitis: Improving These are new findings with LFTs that were normal on 8/25.  No history of cirrhosis or portal hypertension's.  No abnormal findings noted on CT abdomen/pelvis other than some scattered ascites.  Acute hepatitis panel negative.  High suspicion from shock liver with significant hypotension. --AST 416>1475>737>558>347 (20 on 8/27) --ALT 197>879>707>703>584 (12 on 8/27) --Avoid hepatotoxins, especially as Tylenol/statins --monitor LFTs daily  Hypotension Patient recently admitted and discharged following CHF exacerbation with noted hypotension with recent started on midodrine.  Patient was noted to have significant hypotension on arrival with SBP 61. --Midodrine increased to 15 mg p.o. 3 times daily; BP much  improved --Continue monitor BP closely  Elevated  troponin High sensitive troponin [phone].  Etiology likely secondary to demand ischemia from acute illness/hypotension.  Denies chest pain. --Cardiology following, continue to monitor on telemetry  Acute renal failure on CKD stage IIIa: Improving On arrival, patient's creatinine elevated at 3.87.  Baseline Cr 1.5.  Etiology likely secondary to ATN from significant hypotension versus prerenal azotemia from dehydration with intractable nausea vomiting; also concern for cardiorenal syndrome. FeNa = 0.8%; which is more consistent with a prerenal azotemia. --Cr 3.87>3.70>2.84>2.08>1.58 --IV fluids now discontinued --Avoid nephrotoxins, renal dose all medications --BMP daily  Hyperkalemia: Resolved Potassium elevated at 6.2 on admission.  Patient was given NovoLog, dextrose and Lokelma.  Etiology likely secondary to acute renal failure as above vs home K supplement. --K 6.2>>>3.5 --Home potassium supplement has been discontinued --Monitor on telemetry --Follow BMP daily  Chronic diastolic congestive heart failure Pulmonary hypertension Chronic right-sided heart failure TTE 09/2020 with LVEF 55-60%, fairly reduced RV systolic function, RV severely enlarged, moderate pulmonary artery systolic pressure.  Recently hospitalized and discharged on 8/30 for CHF exacerbation, with 20 pound weight loss.  At baseline on torsemide 80 mg p.o. every morning and 6 mg p.o. every afternoon. --Cardiology following, appreciate assistance --Net negative 1.6L past 24h and net negative 3.4L since admission --restarted torsemide 65m BID on 9/4 --Strict I's and O's and daily weights  COPD, oxygen dependent Chronic hypoxic respiratory failure At baseline on 3 L nasal cannula.  PFTs with severe obstruction and moderate restriction on 6/22.  Not in acute exacerbation. --Incruse Ellipta 62.5 mcg 1 puff daily --Albuterol neb every 4 hours as needed shortness of  breath/wheezing --Continue supplemental oxygen, maintain SPO2 greater than 88%.  Hx pulmonary embolism --Eliquis 5 mg p.o. twice daily  Hx CTEPH --Riociguat 1.564mBID  Hx OSA --Intolerant to CPAP  Infrarenal abdominal aortic aneurysm Measuring up to 3.5 cm on imaging.  Recommend repeat ultrasound every 2 years.  GERD: Protonix 40 mg p.o. daily  BPH/chronic urinary retention Patient reports self catheterize up to 3 times daily. --Finasteride 5 mg p.o. nightly --Discontinued home Cardura 2/2 hypotension   DVT prophylaxis: Place TED hose Start: 04/19/21 1341 apixaban (ELIQUIS) tablet 5 mg    Code Status: Full Code Family Communication: Updated family present at bedside this morning  Disposition Plan:  Level of care: Progressive Status is: Inpatient  Remains inpatient appropriate because:Ongoing diagnostic testing needed not appropriate for outpatient work up, Unsafe d/c plan, IV treatments appropriate due to intensity of illness or inability to take PO, and Inpatient level of care appropriate due to severity of illness  Dispo: The patient is from: Home              Anticipated d/c is to: Home              Patient currently is not medically stable to d/c.   Difficult to place patient No   Consultants:  Cardiology/heart failure team, Dr. McAundra DubinDr. BeHaroldine Lawseneral surgery, Dr. TsGeorgette Dover signed off 9/3  Procedures:  None  Antimicrobials:  None   Subjective: Patient seen examined at bedside, resting comfortably.  Working with physical therapy this morning.  Daughter present at bedside, concerned about him returning home due to navigation of 2 flights of stairs in his bedroom being on the second floor.  Patient with no other specific complaints this morning.  Renal function and transaminitis both continue to improve.  Currently denies headache, no chest pain, no palpitations, no shortness of breath more than his normal baseline, no abdominal pain,  no weakness, no fatigue,  no active nausea/vomiting, no diarrhea, no weakness, no fatigue, no paresthesias.  No acute events overnight per nursing staff.  Objective: Vitals:   04/22/21 1936 04/22/21 2328 04/23/21 0300 04/23/21 0835  BP: 108/73 97/63 (!) 87/53 117/71  Pulse: (!) 102 89 99 92  Resp: _0 Temp: 98.5 F (36.9 C) 98 F (36.7 C) 98.2 F (36.8 C) 98 F (36.7 C)  TempSrc: Oral Oral Oral Oral  SpO2: (!) 88% 97% 90% 98%  Weight:      Height:        Intake/Output Summary (Last 24 hours) at 04/23/2021 1052 Last data filed at 04/23/2021 0900 Gross per 24 hour  Intake 240 ml  Output 2250 ml  Net -2010 ml   Filed Weights   04/19/21 1723 04/20/21 0417 04/21/21 0019  Weight: 98.3 kg 100.8 kg 100.3 kg    Examination:  General exam: Appears calm and comfortable, elderly/chronically ill in appearance Respiratory system: Clear to auscultation. Respiratory effort normal.  On 4 L nasal cannula  Cardiovascular system: S1 & S2 heard, RRR. No JVD, murmurs, rubs, gallops or clicks. No pedal edema. Gastrointestinal system: Abdomen is nondistended, soft and nontender. No organomegaly or masses felt. Normal bowel sounds heard. Central nervous system: Alert and oriented. No focal neurological deficits. Extremities: Symmetric 5 x 5 power. Skin: No rashes, lesions or ulcers Psychiatry: Judgement and insight appear normal. Mood & affect appropriate.     Data Reviewed: I have personally reviewed following labs and imaging studies  CBC: Recent Labs  Lab 04/17/21 0210 04/19/21 0915 04/20/21 0119 04/21/21 0045  WBC 4.5 12.1* 8.8 4.7  NEUTROABS 2.8 10.8*  --   --   HGB 14.7 14.7 13.8 13.9  HCT 44.2 45.2 42.7 41.6  MCV 86.0 89.2 87.3 85.6  PLT 161 117* 112* 737*   Basic Metabolic Panel: Recent Labs  Lab 04/17/21 0210 04/19/21 0915 04/19/21 1713 04/19/21 1718 04/20/21 0119 04/21/21 0045 04/22/21 0100 04/23/21 0016  NA 136   < > 133*  --  134* 134* 134* 137  K 3.6   < > 4.8  --  4.6 3.8 3.7  3.5  CL 90*   < > 90*  --  89* 95* 93* 93*  CO2 38*   < > 34*  --  33* 31 31 33*  GLUCOSE 94   < > 71  --  78 71 110* 89  BUN 22   < > 45*  --  50* 49* 38* 26*  CREATININE 1.47*   < > 3.87*  --  3.70* 2.84* 2.08* 1.58*  CALCIUM 9.1   < > 9.5  --  9.4 8.7* 9.1 9.4  MG 1.7  --   --  2.1  --   --   --   --    < > = values in this interval not displayed.   GFR: Estimated Creatinine Clearance: 42.3 mL/min (A) (by C-G formula based on SCr of 1.58 mg/dL (H)). Liver Function Tests: Recent Labs  Lab 04/19/21 0915 04/20/21 0119 04/21/21 0045 04/22/21 0100 04/23/21 0016  AST 416* 1,475* 737* 558* 347*  ALT 197* 879* 707* 703* 584*  ALKPHOS 107 95 86 92 86  BILITOT 2.3* 1.8* 1.8* 2.6* 2.5*  PROT 6.8 6.2* 5.7* 6.2* 6.4*  ALBUMIN 3.1* 2.8* 2.6* 2.8* 2.9*   Recent Labs  Lab 04/19/21 0915  LIPASE 24   No results for input(s): AMMONIA in the last 168 hours. Coagulation Profile:  No results for input(s): INR, PROTIME in the last 168 hours. Cardiac Enzymes: No results for input(s): CKTOTAL, CKMB, CKMBINDEX, TROPONINI in the last 168 hours. BNP (last 3 results) No results for input(s): PROBNP in the last 8760 hours. HbA1C: No results for input(s): HGBA1C in the last 72 hours. CBG: Recent Labs  Lab 04/19/21 1506  GLUCAP 92   Lipid Profile: No results for input(s): CHOL, HDL, LDLCALC, TRIG, CHOLHDL, LDLDIRECT in the last 72 hours. Thyroid Function Tests: No results for input(s): TSH, T4TOTAL, FREET4, T3FREE, THYROIDAB in the last 72 hours. Anemia Panel: No results for input(s): VITAMINB12, FOLATE, FERRITIN, TIBC, IRON, RETICCTPCT in the last 72 hours. Sepsis Labs: Recent Labs  Lab 04/20/21 0119 04/21/21 0045  PROCALCITON 3.35 2.11    Recent Results (from the past 240 hour(s))  Resp Panel by RT-PCR (Flu A&B, Covid) Nasopharyngeal Swab     Status: None   Collection Time: 04/19/21  9:34 AM   Specimen: Nasopharyngeal Swab; Nasopharyngeal(NP) swabs in vial transport medium   Result Value Ref Range Status   SARS Coronavirus 2 by RT PCR NEGATIVE NEGATIVE Final    Comment: (NOTE) SARS-CoV-2 target nucleic acids are NOT DETECTED.  The SARS-CoV-2 RNA is generally detectable in upper respiratory specimens during the acute phase of infection. The lowest concentration of SARS-CoV-2 viral copies this assay can detect is 138 copies/mL. A negative result does not preclude SARS-Cov-2 infection and should not be used as the sole basis for treatment or other patient management decisions. A negative result may occur with  improper specimen collection/handling, submission of specimen other than nasopharyngeal swab, presence of viral mutation(s) within the areas targeted by this assay, and inadequate number of viral copies(<138 copies/mL). A negative result must be combined with clinical observations, patient history, and epidemiological information. The expected result is Negative.  Fact Sheet for Patients:  EntrepreneurPulse.com.au  Fact Sheet for Healthcare Providers:  IncredibleEmployment.be  This test is no t yet approved or cleared by the [place] FDA and  has been authorized for detection and/or diagnosis of SARS-CoV-2 by FDA under an Emergency Use Authorization (EUA). This EUA will remain  in effect (meaning this test can be used) for the duration of the COVID-19 declaration under Section 564(b)(1) of the Act, 21 U.S.C.section 360bbb-3(b)(1), unless the authorization is terminated  or revoked sooner.       Influenza A by PCR NEGATIVE NEGATIVE Final   Influenza B by PCR NEGATIVE NEGATIVE Final    Comment: (NOTE) The Xpert Xpress SARS-CoV-2/FLU/RSV plus assay is intended as an aid in the diagnosis of influenza from Nasopharyngeal swab specimens and should not be used as a sole basis for treatment. Nasal washings and aspirates are unacceptable for Xpert Xpress SARS-CoV-2/FLU/RSV testing.  Fact Sheet for  Patients: EntrepreneurPulse.com.au  Fact Sheet for Healthcare Providers: IncredibleEmployment.be  This test is not yet approved or cleared by the [place] FDA and has been authorized for detection and/or diagnosis of SARS-CoV-2 by FDA under an Emergency Use Authorization (EUA). This EUA will remain in effect (meaning this test can be used) for the duration of the COVID-19 declaration under Section 564(b)(1) of the Act, 21 U.S.C. section 360bbb-3(b)(1), unless the authorization is terminated or revoked.  Performed at [facility], [place] [address] [place], [place] [address]   Culture, blood (routine x 2)     Status: None (Preliminary result)   Collection Time: 04/20/21 11:50 AM   Specimen: BLOOD RIGHT ARM  Result Value Ref Range Status  Specimen Description BLOOD RIGHT ARM  Final   Special Requests   Final    BOTTLES DRAWN AEROBIC AND ANAEROBIC Blood Culture adequate volume   Culture   Final    NO GROWTH 3 DAYS Performed at [facility], [address], [place], [place] [address]    Report Status PENDING  Incomplete  Culture, blood (routine x 2)     Status: None (Preliminary result)   Collection Time: 04/20/21 11:56 AM   Specimen: BLOOD LEFT HAND  Result Value Ref Range Status   Specimen Description BLOOD LEFT HAND  Final   Special Requests   Final    BOTTLES DRAWN AEROBIC ONLY Blood Culture results may not be optimal due to an inadequate volume of blood received in culture bottles   Culture   Final    NO GROWTH 3 DAYS Performed at [facility], [place] [address], [place], [place] [address]    Report Status PENDING  Incomplete         Radiology Studies: DG CHEST PORT 1 VIEW  Result Date: 04/22/2021 CLINICAL DATA:  Patient with weakness.  Shortness of breath. EXAM: PORTABLE CHEST 1 VIEW COMPARISON:  Chest radiograph 04/19/2021 FINDINGS: Monitoring leads overlie the patient. Cardiomegaly. Interval development of  diffuse bilateral airspace opacities. No pleural effusion or pneumothorax. IMPRESSION: New diffuse bilateral airspace opacities may represent edema or multifocal infection. Electronically Signed   By: Lovey Newcomer M.D.   On: 04/22/2021 11:49        Scheduled Meds:  allopurinol  100 mg Oral q morning   apixaban  5 mg Oral BID   finasteride  5 mg Oral QHS   gabapentin  200 mg Oral TID   midodrine  15 mg Oral TID WC   pantoprazole  40 mg Oral q morning   Riociguat  1.5 mg Oral TID   sodium chloride flush  3 mL Intravenous Q12H   torsemide  60 mg Oral BID   umeclidinium bromide  1 puff Inhalation QPM   Continuous Infusions:  sodium chloride       LOS: 4 days    Time spent: 39 minutes spent on chart review, discussion with nursing staff, consultants, updating family and interview/physical exam; more than 50% of that time was spent in counseling and/or coordination of care.    Delorese Sellin J [place] DO [facility] Available via Epic secure chat 7am-7pm After these hours, please refer to coverage provider listed on [email or web address] 04/23/2021, 10:52 AM

## 2021-04-23 NOTE — Progress Notes (Signed)
Patient ID: Joshua [name], male   DOB: [birth year], 85 y.o.   MRN: [id number]     Advanced Heart Failure Rounding Note  PCP-Cardiologist: Sherren Mocha, MD   Subjective:    Patient admitted 09/01 with N, V, hypotension and AKI on CKD after recent admission for a/c diastolic HF/RV failure. Gave IV fluids and Midodrine increased to 15 mg TID to support BP.  Creatinine improved, down to 1.58 today.  Started back on torsemide 60 mg po bid yesterday.  Weight down 1 lb. SBP stable 90s-110s.  He was not orthostatic today with PT.   Main complaint is still hiccups.  He is on Reglan and gabapentin.   CT abdomen pelvis 9/1 with abrupt narrowing third portion of duodenum as it passes between abdominal aorta and SMV with upstream dilatation suggestive of nutcracker phenomenon   Objective:   Weight Range: 100.3 kg Body mass index is 27.64 kg/m.   Vital Signs:   Temp:  [97.4 F (36.3 C)-98.5 F (36.9 C)] 98 F (36.7 C) (09/05 0835) Pulse Rate:  [89-102] 92 (09/05 0835) Resp:  [13-19] 16 (09/05 0835) BP: (87-117)/(53-73) 117/71 (09/05 0835) SpO2:  [88 %-98 %] 98 % (09/05 0835) Last BM Date: 04/18/21  Weight change: Filed Weights   04/19/21 1723 04/20/21 0417 04/21/21 0019  Weight: 98.3 kg 100.8 kg 100.3 kg    Intake/Output:   Intake/Output Summary (Last 24 hours) at 04/23/2021 1014 Last data filed at 04/23/2021 0836 Gross per 24 hour  Intake --  Output 2250 ml  Net -2250 ml      Physical Exam    General: NAD Neck: JVP 8 cm, no thyromegaly or thyroid nodule.  Lungs: Clear to auscultation bilaterally with normal respiratory effort. CV: Nondisplaced PMI.  Heart regular S1/S2, no S3/S4, no murmur.  No peripheral edema.   Abdomen: Soft, nontender, no hepatosplenomegaly, no distention.  Skin: Intact without lesions or rashes.  Neurologic: Alert and oriented x 3.  Psych: Normal affect. Extremities: No clubbing or cyanosis.  HEENT: Normal.    Telemetry   NSR 80s Personally  reviewed  Labs    CBC Recent Labs    04/21/21 0045  WBC 4.7  HGB 13.9  HCT 41.6  MCV 85.6  PLT 419*   Basic Metabolic Panel Recent Labs    04/22/21 0100 04/23/21 0016  NA 134* 137  K 3.7 3.5  CL 93* 93*  CO2 31 33*  GLUCOSE 110* 89  BUN 38* 26*  CREATININE 2.08* 1.58*  CALCIUM 9.1 9.4   Liver Function Tests Recent Labs    04/22/21 0100 04/23/21 0016  AST 558* 347*  ALT 703* 584*  ALKPHOS 92 86  BILITOT 2.6* 2.5*  PROT 6.2* 6.4*  ALBUMIN 2.8* 2.9*   No results for input(s): LIPASE, AMYLASE in the last 72 hours.  Cardiac Enzymes No results for input(s): CKTOTAL, CKMB, CKMBINDEX, TROPONINI in the last 72 hours.  BNP: BNP (last 3 results) Recent Labs    04/12/21 1435 04/19/21 0915 04/22/21 0958  BNP 1,597.1* 2,618.0* 1,156.1*    ProBNP (last 3 results) No results for input(s): PROBNP in the last 8760 hours.   D-Dimer No results for input(s): DDIMER in the last 72 hours. Hemoglobin A1C No results for input(s): HGBA1C in the last 72 hours. Fasting Lipid Panel No results for input(s): CHOL, HDL, LDLCALC, TRIG, CHOLHDL, LDLDIRECT in the last 72 hours. Thyroid Function Tests No results for input(s): TSH, T4TOTAL, T3FREE, THYROIDAB in the last 72 hours.  Invalid input(s):  FREET3  Other results:   Imaging    No results found.   Medications:     Scheduled Medications:  allopurinol  100 mg Oral q morning   apixaban  5 mg Oral BID   finasteride  5 mg Oral QHS   gabapentin  200 mg Oral TID   midodrine  15 mg Oral TID WC   pantoprazole  40 mg Oral q morning   Riociguat  1.5 mg Oral TID   sodium chloride flush  3 mL Intravenous Q12H   torsemide  60 mg Oral BID   umeclidinium bromide  1 puff Inhalation QPM    Infusions:  sodium chloride      PRN Medications: sodium chloride, albuterol, ipratropium, metoCLOPramide (REGLAN) injection, ondansetron **OR** ondansetron (ZOFRAN) IV, sodium chloride flush   Assessment/Plan   1. AKI on  Stage III CKD: Baseline SCr ~1.6. Scr 1.47 at d/c on 08/30. Up to 3.69 on admit -> 1.58 today.  Likely AKI due to hypovolemia and RV failure with nausea/vomiting poor po intake and ongoing diuretic use.  BP better on midodrine.  - continue midodrine 15 mg TID for BP support.  2. Chronic HFpEF/RV failure: Echo (2/22) with EF 55-60%, IV septum flattened, severe RV enlargement, severely decreased RV function, PASP 57 mmHg.  RHC in 4/22 showed normal filling pressures. He has end-stage RV failure.  Diuretics initially held this admission with over-diuresis. Now has likely built up mild volume overload.   - Midodrine increased to 15 mg TID to support BP - Restarted torsemide at 60 mg po bid, follow I/Os and weight.  - Will not restart Farxiga  - TED hose - Use incentive spirometer.  3. Pulmonary HTN: PAH noted on 4/22 RHC with PVR 6.1 WU.  This appears to be multifactorial with OSA, emphysema, hypoxemia with ambulation, and a suspected chronic PE involving the right middle lobe (group 3 and group 4 PH). Given the suspected mixed etiology with only 1 area of chronic thromboembolism (right middle lobe) as well as age, do not think that pulmonary thromboendarterectomy would be indicated.  Rheumatologic serologic workup was negative.  PFTs showed severe obstruction and moderate restriction, suggesting significant COPD. - Continue riociguat for CTEPH, 1.5 mg bid.  - O2 sats stable on 3L 02, on chronic 02 at home 4. Elevated troponin:  HS trop 247 > 499 > 717 > 893. No chest pain.  Likely due to demand ischemia from hypovolemia/low BP.  5. Transaminitis/shock liver from hypotension: Improving. No significant abnormality identified on RUQ [place]. Small amount of perihepatic ascites 6. Duodenal abnormality:  CT abdomen 04/19/21 with narrowing distal third duodenum with upstream dilatation. 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  - has been  seen by [facility]. Not surgical candidate. Symptoms improved 7. OSA: Untreated.  Insurance would apparently not cover home sleep study.  Need to address this as outpatient. Can he get home SS (wants this) versus lab sleep study? 8. Emphysema: Prior smoker.  Emphysema on CT and severe obstruction on PFTs - He uses home oxygen  9. Orthostatic Hypotension: Likely due to severe RV dysfunction.  Improved on midodrine.  10. Chronic PE: on Eliquis 11. BPH with urinary retention: He has had issues with this in the past, often self caths 3-4x/day.  - On Flomax.  - Had outpatient f/u scheduled but missed d/t admission 12. Hiccups: Have been intractable.  Still an issue for him, now on Reglan and gabapentin.  - ?  Try baclofen => per primary team.   Length of Stay: [place], MD  04/23/2021, 10:14 AM  Advanced Heart Failure Team Pager [phone] (M-F; 7a - 5p)  Please contact [facility] for night-coverage after hours (5p -7a ) and weekends on [email or web address]

## 2021-04-23 NOTE — Progress Notes (Addendum)
Patient had episode of emesis around 2030. Per patient, patient stated that, he must have been asleep when he vomited. Patient given suction/yankauer to suction any secretions.  Increase O2 to maintain sats >92. Patient denies CP or SOB. Instructed patient to keep head elevated so that he won't aspirate if he was to vomit again while sleeping. Will continue to monitor.

## 2021-04-23 NOTE — Progress Notes (Signed)
Physical Therapy Treatment Patient Details Name: Joshua [name] MRN: [id number] DOB: [birth year] Today's Date: 04/23/2021    History of Present Illness Pt is an 85 y/o male admitted secondary to nausea/vomiting. CT showed duodenal narrowing. Pt recently admitted 8/25-8/30 for CHF. On 9/4, chest X ray showed edema vs possible infection. PMH includes COPD on supplemental oxygen, pulmonary HTN, CKD, PE, and L TKA.    PT Comments    Pt is progressing toward his goals. Min guard to supervision for all mobility tasks today, including ambulation in hallway with RW and stair training. Pt on 4L via [place] throughout duration of session; several instances dropped below 90% but had poor pleth and rebounded quickly with UE repositioning during rest break in combination with pursed-lip breathing. Recommendations updated to HHPT with supervision for mobility. Educated about stair safety and moving bed downstairs if possible. We will continue to follow him acutely to promote independence with functional mobility.     Follow Up Recommendations  Home health PT;Supervision for mobility/OOB     Equipment Recommendations  None recommended by PT    Recommendations for Other Services       Precautions / Restrictions Precautions Precautions: Fall Restrictions Weight Bearing Restrictions: No    Mobility  Bed Mobility               General bed mobility comments: Pt standing beside bed upon entry with RN present    Transfers Overall transfer level: Needs assistance Equipment used: Rolling walker (2 wheeled) Transfers: Sit to/from Stand Sit to Stand: Min guard         General transfer comment: Pt min guard for safety only. Performing stand to sit at recliner pt sat heavily, "plopped down," likely secondary to fatigue.  Ambulation/Gait Ambulation/Gait assistance: Supervision;Min guard Gait Distance (Feet): 200 Feet Assistive device: Rolling walker (2 wheeled) Gait Pattern/deviations:  Step-through pattern;Decreased stride length Gait velocity: Decreased   General Gait Details: Pt ambulated in hallway with RW; no overt LOB noted, min guard to supervision for safety. Pt demonstrated decreased DF bilaterally and very low foot clearance. Cadence slow and cautious with heavy reliance on BUE on RW. At one point during ambulation SpO2 read at 83% on 4L, however, did not have good pleth. Had pt release RW on hand that was being monitored and oxygen sats quickly returned to 89-95% on 4L. Several standing rest breaks with pursed lip breathing to maintain SpO2.   Stairs Stairs: Yes Stairs assistance: Min guard Stair Management: Backwards;Forwards;One rail Right;Step to pattern Number of Stairs: 5 General stair comments: Pt utilized RUE on R rail for one step forward and backward X5 bouts to mimic 5 steps into the house. Min guard for safety and line management only, no physical assist required. SpO2 WFL throughout. Educated pt regarding 14 steps to second floor; recomended placing straight chair upon landing halfway for rest break. Emphasized the better plan is to move the bed downstairs during recovery; pt verbalized agreement.   Wheelchair Mobility    Modified Rankin (Stroke Patients Only)       Balance Overall balance assessment: Needs assistance Sitting-balance support: Feet supported;No upper extremity supported Sitting balance-Leahy Scale: Good     Standing balance support: Bilateral upper extremity supported;During functional activity;No upper extremity supported Standing balance-Leahy Scale: Fair Standing balance comment: Pt able to perform static stance for several minutes without support during setup; pt reliant on BUE on RW during functional mobility tasks.  Cognition Arousal/Alertness: Awake/alert Behavior During Therapy: WFL for tasks assessed/performed Overall Cognitive Status: Within Functional Limits for tasks assessed                                         Exercises      General Comments General comments (skin integrity, edema, etc.): Pt on 4L via [place] throughout duration of session; several instances dropped below 90% but had poor pleth and rebounded quickly with UE repositioning during rest break in combination with pursed-lip breathing. Pt's daughter in room.      Pertinent Vitals/Pain Pain Assessment: No/denies pain    Home Living                      Prior Function            PT Goals (current goals can now be found in the care plan section) Acute Rehab PT Goals Patient Stated Goal: to be able to go home PT Goal Formulation: With patient Time For Goal Achievement: 05/05/21 Potential to Achieve Goals: Good Progress towards PT goals: Progressing toward goals    Frequency    Min 3X/week      PT Plan Discharge plan needs to be updated    Co-evaluation              AM-PAC PT "6 Clicks" Mobility   Outcome Measure  Help needed turning from your back to your side while in a flat bed without using bedrails?: None Help needed moving from lying on your back to sitting on the side of a flat bed without using bedrails?: None Help needed moving to and from a bed to a chair (including a wheelchair)?: None Help needed standing up from a chair using your arms (e.g., wheelchair or bedside chair)?: A Little Help needed to walk in hospital room?: A Little Help needed climbing 3-5 steps with a railing? : A Little 6 Click Score: 21    End of Session Equipment Utilized During Treatment: Oxygen;Gait belt Activity Tolerance: Patient tolerated treatment well Patient left: in chair;with call bell/phone within reach;with family/visitor present;with nursing/sitter in room (MD; and pt's daughter) Nurse Communication: Mobility status PT Visit Diagnosis: Unsteadiness on feet (R26.81);Muscle weakness (generalized) (M62.81)     Time: 6712-4580 PT Time Calculation (min)  (ACUTE ONLY): 31 min  Charges:  $Gait Training: 23-37 mins                     Dawayne Cirri, SPT Dawayne Cirri 04/23/2021, 11:21 AM

## 2021-04-24 ENCOUNTER — Inpatient Hospital Stay (HOSPITAL_COMMUNITY): Payer: Medicare Other

## 2021-04-24 ENCOUNTER — Inpatient Hospital Stay: Payer: Self-pay

## 2021-04-24 DIAGNOSIS — E875 Hyperkalemia: Secondary | ICD-10-CM

## 2021-04-24 DIAGNOSIS — N170 Acute kidney failure with tubular necrosis: Secondary | ICD-10-CM | POA: Diagnosis not present

## 2021-04-24 DIAGNOSIS — I50812 Chronic right heart failure: Secondary | ICD-10-CM | POA: Diagnosis not present

## 2021-04-24 DIAGNOSIS — J9611 Chronic respiratory failure with hypoxia: Secondary | ICD-10-CM | POA: Diagnosis not present

## 2021-04-24 DIAGNOSIS — Z515 Encounter for palliative care: Secondary | ICD-10-CM

## 2021-04-24 DIAGNOSIS — N138 Other obstructive and reflux uropathy: Secondary | ICD-10-CM

## 2021-04-24 DIAGNOSIS — N1831 Chronic kidney disease, stage 3a: Secondary | ICD-10-CM

## 2021-04-24 DIAGNOSIS — R000 Tachycardia, unspecified: Secondary | ICD-10-CM

## 2021-04-24 DIAGNOSIS — R579 Shock, unspecified: Secondary | ICD-10-CM

## 2021-04-24 DIAGNOSIS — J432 Centrilobular emphysema: Secondary | ICD-10-CM

## 2021-04-24 DIAGNOSIS — Z66 Do not resuscitate: Secondary | ICD-10-CM

## 2021-04-24 DIAGNOSIS — N401 Enlarged prostate with lower urinary tract symptoms: Secondary | ICD-10-CM

## 2021-04-24 DIAGNOSIS — R112 Nausea with vomiting, unspecified: Secondary | ICD-10-CM | POA: Diagnosis not present

## 2021-04-24 DIAGNOSIS — Z7189 Other specified counseling: Secondary | ICD-10-CM

## 2021-04-24 LAB — HEPATIC FUNCTION PANEL
ALT: 459 U/L — ABNORMAL HIGH (ref 0–44)
AST: 173 U/L — ABNORMAL HIGH (ref 15–41)
Albumin: 3.1 g/dL — ABNORMAL LOW (ref 3.5–5.0)
Alkaline Phosphatase: 90 U/L (ref 38–126)
Bilirubin, Direct: 0.9 mg/dL — ABNORMAL HIGH (ref 0.0–0.2)
Indirect Bilirubin: 1.3 mg/dL — ABNORMAL HIGH (ref 0.3–0.9)
Total Bilirubin: 2.2 mg/dL — ABNORMAL HIGH (ref 0.3–1.2)
Total Protein: 6.8 g/dL (ref 6.5–8.1)

## 2021-04-24 LAB — EXPECTORATED SPUTUM ASSESSMENT W GRAM STAIN, RFLX TO RESP C

## 2021-04-24 LAB — BLOOD GAS, ARTERIAL
Acid-Base Excess: 10.6 mmol/L — ABNORMAL HIGH (ref 0.0–2.0)
Bicarbonate: 35.9 mmol/L — ABNORMAL HIGH (ref 20.0–28.0)
Drawn by: 52388
FIO2: 38
O2 Saturation: 69.9 %
Patient temperature: 37
pCO2 arterial: 60.5 mmHg — ABNORMAL HIGH (ref 32.0–48.0)
pH, Arterial: 7.391 (ref 7.350–7.450)
pO2, Arterial: 38.5 mmHg — CL (ref 83.0–108.0)

## 2021-04-24 LAB — GLUCOSE, CAPILLARY: Glucose-Capillary: 122 mg/dL — ABNORMAL HIGH (ref 70–99)

## 2021-04-24 LAB — TROPONIN I (HIGH SENSITIVITY)
Troponin I (High Sensitivity): 100 ng/L (ref <18)
Troponin I (High Sensitivity): 116 ng/L (ref <18)
Troponin I (High Sensitivity): 97 ng/L — ABNORMAL HIGH (ref <18)
Troponin I (High Sensitivity): 102 ng/L (ref <18)

## 2021-04-24 LAB — URINALYSIS, ROUTINE W REFLEX MICROSCOPIC
Bilirubin Urine: NEGATIVE
Glucose, UA: NEGATIVE mg/dL
Ketones, ur: NEGATIVE mg/dL
Nitrite: NEGATIVE
Protein, ur: NEGATIVE mg/dL
Specific Gravity, Urine: 1.01 (ref 1.005–1.030)
pH: 5 (ref 5.0–8.0)

## 2021-04-24 LAB — LACTIC ACID, PLASMA
Lactic Acid, Venous: 1 mmol/L (ref 0.5–1.9)
Lactic Acid, Venous: 0.9 mmol/L (ref 0.5–1.9)

## 2021-04-24 LAB — BASIC METABOLIC PANEL
Anion gap: 9 (ref 5–15)
BUN: 26 mg/dL — ABNORMAL HIGH (ref 8–23)
CO2: 35 mmol/L — ABNORMAL HIGH (ref 22–32)
Calcium: 9.5 mg/dL (ref 8.9–10.3)
Chloride: 92 mmol/L — ABNORMAL LOW (ref 98–111)
Creatinine, Ser: 1.77 mg/dL — ABNORMAL HIGH (ref 0.61–1.24)
GFR, Estimated: 38 mL/min — ABNORMAL LOW (ref >60)
Glucose, Bld: 117 mg/dL — ABNORMAL HIGH (ref 70–99)
Potassium: 3.6 mmol/L (ref 3.5–5.1)
Sodium: 136 mmol/L (ref 135–145)

## 2021-04-24 LAB — COOXEMETRY PANEL
Carboxyhemoglobin: 2.2 % — ABNORMAL HIGH (ref 0.5–1.5)
Carboxyhemoglobin: 1.9 % — ABNORMAL HIGH (ref 0.5–1.5)
Methemoglobin: 0.7 % (ref 0.0–1.5)
Methemoglobin: 0.7 % (ref 0.0–1.5)
O2 Saturation: 64.8 %
O2 Saturation: 62.1 %
Total hemoglobin: 14.7 g/dL (ref 12.0–16.0)
Total hemoglobin: 15.2 g/dL (ref 12.0–16.0)

## 2021-04-24 LAB — CBC
HCT: 44.6 % (ref 39.0–52.0)
HCT: 45.2 % (ref 39.0–52.0)
Hemoglobin: 14.6 g/dL (ref 13.0–17.0)
Hemoglobin: 14.9 g/dL (ref 13.0–17.0)
MCH: 28.3 pg (ref 26.0–34.0)
MCH: 28.6 pg (ref 26.0–34.0)
MCHC: 32.7 g/dL (ref 30.0–36.0)
MCHC: 33 g/dL (ref 30.0–36.0)
MCV: 86.6 fL (ref 80.0–100.0)
MCV: 86.8 fL (ref 80.0–100.0)
Platelets: 102 10*3/uL — ABNORMAL LOW (ref 150–400)
Platelets: 102 10*3/uL — ABNORMAL LOW (ref 150–400)
RBC: 5.15 MIL/uL (ref 4.22–5.81)
RBC: 5.21 MIL/uL (ref 4.22–5.81)
RDW: 15.9 % — ABNORMAL HIGH (ref 11.5–15.5)
RDW: 15.9 % — ABNORMAL HIGH (ref 11.5–15.5)
WBC: 12.5 10*3/uL — ABNORMAL HIGH (ref 4.0–10.5)
WBC: 14 10*3/uL — ABNORMAL HIGH (ref 4.0–10.5)
nRBC: 0 % (ref 0.0–0.2)
nRBC: 0 % (ref 0.0–0.2)

## 2021-04-24 LAB — AMMONIA: Ammonia: 14 umol/L (ref 9–35)

## 2021-04-24 LAB — MRSA NEXT GEN BY PCR, NASAL: MRSA by PCR Next Gen: NOT DETECTED

## 2021-04-24 LAB — PROCALCITONIN: Procalcitonin: 1.04 ng/mL

## 2021-04-24 MED ORDER — NOREPINEPHRINE 4 MG/250ML-% IV SOLN
2.0000 ug/min | INTRAVENOUS | Status: DC
  Administered 2021-04-24: 2 ug/min via INTRAVENOUS
  Administered 2021-04-24: 5 ug/min via INTRAVENOUS
  Filled 2021-04-24 (×2): qty 250

## 2021-04-24 MED ORDER — NOREPINEPHRINE 4 MG/250ML-% IV SOLN
0.0000 ug/min | INTRAVENOUS | Status: DC
  Administered 2021-04-24 – 2021-04-25 (×2): 9 ug/min via INTRAVENOUS
  Administered 2021-04-25: 10 ug/min via INTRAVENOUS
  Administered 2021-04-25: 9 ug/min via INTRAVENOUS
  Filled 2021-04-24 (×4): qty 250

## 2021-04-24 MED ORDER — VANCOMYCIN HCL 1250 MG/250ML IV SOLN
1250.0000 mg | INTRAVENOUS | Status: DC
  Filled 2021-04-24: qty 250

## 2021-04-24 MED ORDER — FUROSEMIDE 10 MG/ML IJ SOLN
20.0000 mg | Freq: Once | INTRAMUSCULAR | Status: AC
  Administered 2021-04-24: 20 mg via INTRAVENOUS
  Filled 2021-04-24: qty 2

## 2021-04-24 MED ORDER — CHLORHEXIDINE GLUCONATE CLOTH 2 % EX PADS
6.0000 | MEDICATED_PAD | Freq: Every day | CUTANEOUS | Status: DC
  Administered 2021-04-24 – 2021-04-28 (×5): 6 via TOPICAL

## 2021-04-24 MED ORDER — SODIUM CHLORIDE 0.9 % IV SOLN
250.0000 mL | INTRAVENOUS | Status: DC
  Administered 2021-04-24 (×2): 250 mL via INTRAVENOUS

## 2021-04-24 MED ORDER — SODIUM CHLORIDE 0.9 % IV SOLN
2.0000 g | Freq: Two times a day (BID) | INTRAVENOUS | Status: DC
  Administered 2021-04-24 – 2021-04-27 (×8): 2 g via INTRAVENOUS
  Filled 2021-04-24 (×7): qty 2

## 2021-04-24 MED ORDER — TRANEXAMIC ACID FOR INHALATION
500.0000 mg | Freq: Three times a day (TID) | RESPIRATORY_TRACT | Status: AC
  Administered 2021-04-24 – 2021-04-26 (×6): 500 mg via RESPIRATORY_TRACT
  Filled 2021-04-24 (×6): qty 10

## 2021-04-24 MED ORDER — VANCOMYCIN HCL 2000 MG/400ML IV SOLN
2000.0000 mg | Freq: Once | INTRAVENOUS | Status: AC
  Administered 2021-04-24: 2000 mg via INTRAVENOUS
  Filled 2021-04-24: qty 400

## 2021-04-24 MED ORDER — PIPERACILLIN-TAZOBACTAM 3.375 G IVPB: 3.3750 g | Freq: Three times a day (TID) | INTRAVENOUS | Status: DC

## 2021-04-24 NOTE — Consult Note (Addendum)
NAME:  Joshua [name], MRN:  [id number], DOB:  September 12, 1935, LOS: 5 ADMISSION DATE:  04/19/2021, CONSULTATION DATE:  04/24/2021 REFERRING MD:  Dr. Aundra Dubin, CHIEF COMPLAINT:  Hypotension   History of Present Illness:  85 year old male with end stage RV failure on home midodrine, presents to ED on 9/1 with reported progressive nausea/vomiting for the last 48 hours. Recent discharge 8/30 for CHF exacerbation, started on Farxiga and midodrine On arrival to ED BP 61/41. HR 87. Temp 99.1. CXR with increased interstitial coarsening/mild edema. CT A/P with narrowing of narrowing third portion of duodenum between aneurysmal abdominal aorta or and SMV with upstream dilation suggestive of nutcracker phenomenon, small ventral abdominal hernia containing a loop of nonobstructed small bowel. Cardiology, General Surgery Consulted. Underwent upper GI series with no signs of significant duodenal narrowing/stricture. General Surgery Signed off. Throughout stay treated for CHF exacerbation, complicated by AKI and hypotension.   9/6 with hypotension with systolic 81-01B requiring transfer to ICU for vasopressors.   Pertinent  Medical History  Chronic Diastolic HF, Chronic RV failure, Hypotension on midodrine, COPD on 3L [place], BPH, CKD stage 3, PE on Eliquis, Self Caths 3 times per day    Significant Hospital Events: Including procedures, antibiotic start and stop dates in addition to other pertinent events   9/1 Presents to ED  9/6 Transferred to ICU for vasopressors   Interim History / Subjective:  As above   Objective   Blood pressure (!) 80/51, pulse 99, temperature 98.6 F (37 C), temperature source Oral, resp. rate 14, height _0  (1.905 m), weight 96.6 kg, SpO2 100 %.        Intake/Output Summary (Last 24 hours) at 04/24/2021 1017 Last data filed at 04/24/2021 0819 Gross per 24 hour  Intake 720 ml  Output 1350 ml  Net -630 ml   Filed Weights   04/20/21 0417 04/21/21 0019 04/24/21 0511  Weight: 100.8 kg  100.3 kg 96.6 kg    Examination: General: elderly male, no distress  HENT: Dry MM  Lungs: clear breath sounds, no use of accessory muscles  Cardiovascular: Tachy, HR 99 Abdomen: soft, non-tender, active bowel sounds Extremities: generalized edema  Neuro: lethargic, arouses with stimulation, follows simple commands, oriented, to self GU: foley in place   Resolved Hospital Problem list     Assessment & Plan:   Encephalopathy, suspect in setting of hypoperfusion with ongoing hypotension  Plan -Treatment as below  -Send Ammonia level   Acute on Chronic Hypotension, unclear etiology, infectious vs progressive heart failure  -on home 10 mg TID Plan -Continue Midodrine 15 mg TID  -Start Levophed, Titrate for MAP goal >65 -COOX and CVP pending  -Send Cortisol for AM  -PAN Culture  -Continue on Cefepime/Vancomycin for now while waiting on culture data   Right Side Heart Failure with acute exacerbation  -ECHO 2/22 with EF 55-60, severe RV enlargement, severely decreased RV function, PASP 57 mm HG pHTN Infrarenal AAA -Last measure 3.5 cm  H/O PE, CTEPH Plan -Per Cardiology > Plan to hold diuretics  -Continue Eliquis and Riociguat   Chronic Respiratory Failure  -on 3L [place]  COPD H/O OSA does not tolerate CPAP Plan  -Titrate Supplemental Oxygen for Saturation Goal >92 -Encourage good pulmonary hygiene  -Continue Scheduled Nebs  Concern for small bowel obstruction  -KUB 9/6 with mildly prominent central bowel loops, concern for partial small bowel obstruction  -CT A/P 9/1 with Abrupt narrowing of the third portion of the duodenum as it passes between  the aneurysmal abdominal aorta and SMV with upstream dilation is suggestive of nutcracker phenomenon which may account for the patient's symptoms. No evidence of high-grade bowel obstruction. Plan -Surgery signed off 9/3 > no surgical interventions needs, Patient would not be a good surgical candidate,  GERD   Plan -Continue PPI  Acute on CKD stage 3 with cardiorenal syndrome  -Baseline Crt 1.3-1.6 Hyperkalemia  BPH with urinary obstruction  Plan -Trend BMP -Avoid Nephrotoxic agents   Transaminitis in setting of hypoperfusion with end stage RV failure  Plan -Trend LFT   Best Practice (right click and "Reselect all SmartList Selections" daily)   Diet/type: NPO DVT prophylaxis: DOAC GI prophylaxis: PPI Lines: Central line Foley:  Yes, and it is still needed Code Status:  full code Last date of multidisciplinary goals of care discussion [ongoing]  Labs   CBC: Recent Labs  Lab 04/19/21 0915 04/20/21 0119 04/21/21 0045 04/24/21 0850  WBC 12.1* 8.8 4.7 12.5*  NEUTROABS 10.8*  --   --   --   HGB 14.7 13.8 13.9 14.6  HCT 45.2 42.7 41.6 44.6  MCV 89.2 87.3 85.6 86.6  PLT 117* 112* 112* 102*    Basic Metabolic Panel: Recent Labs  Lab 04/19/21 1718 04/20/21 0119 04/21/21 0045 04/22/21 0100 04/23/21 0016 04/24/21 0027  NA  --  134* 134* 134* 137 136  K  --  4.6 3.8 3.7 3.5 3.6  CL  --  89* 95* 93* 93* 92*  CO2  --  33* 31 31 33* 35*  GLUCOSE  --  78 71 110* 89 117*  BUN  --  50* 49* 38* 26* 26*  CREATININE  --  3.70* 2.84* 2.08* 1.58* 1.77*  CALCIUM  --  9.4 8.7* 9.1 9.4 9.5  MG 2.1  --   --   --   --   --    GFR: Estimated Creatinine Clearance: 37.8 mL/min (A) (by C-G formula based on SCr of 1.77 mg/dL (H)). Recent Labs  Lab 04/19/21 0915 04/20/21 0119 04/21/21 0045 04/24/21 0850  PROCALCITON  --  3.35 2.11 1.04  WBC 12.1* 8.8 4.7 12.5*    Liver Function Tests: Recent Labs  Lab 04/20/21 0119 04/21/21 0045 04/22/21 0100 04/23/21 0016 04/24/21 0027  AST 1,475* 737* 558* 347* 173*  ALT 879* 707* 703* 584* 459*  ALKPHOS 95 86 92 86 90  BILITOT 1.8* 1.8* 2.6* 2.5* 2.2*  PROT 6.2* 5.7* 6.2* 6.4* 6.8  ALBUMIN 2.8* 2.6* 2.8* 2.9* 3.1*   Recent Labs  Lab 04/19/21 0915  LIPASE 24   No results for input(s): AMMONIA in the last 168 hours.  ABG     Component Value Date/Time   PHART 7.391 04/24/2021 0220   PCO2ART 60.5 (H) 04/24/2021 0220   PO2ART 38.5 (LL) 04/24/2021 0220   HCO3 35.9 (H) 04/24/2021 0220   TCO2 34 (H) 12/13/2020 1028   O2SAT 69.9 04/24/2021 0220     Coagulation Profile: No results for input(s): INR, PROTIME in the last 168 hours.  Cardiac Enzymes: No results for input(s): CKTOTAL, CKMB, CKMBINDEX, TROPONINI in the last 168 hours.  HbA1C: No results found for: HGBA1C  CBG: Recent Labs  Lab 04/19/21 1506 04/24/21 0856  GLUCAP 92 122*    Review of Systems:   Unable to review given encephalopathy   Past Medical History:  He,  has a past medical history of Arthritis, BPH (benign prostatic hyperplasia), COPD (chronic obstructive pulmonary disease) ([place]), Folliculitis, GERD (gastroesophageal reflux disease), Hypertension, PE (pulmonary thromboembolism) (  [place]), Phlebitis, Pneumonia, and Sleep apnea.   Surgical History:   Past Surgical History:  Procedure Laterality Date   BIOPSY  11/03/2020   Procedure: BIOPSY;  Surgeon: Carol Ada, MD;  Location: [facility];  Service: Endoscopy;;   bone removed from little toe right foot      CHOLECYSTECTOMY     ESOPHAGOGASTRODUODENOSCOPY (EGD) WITH PROPOFOL N/A 11/03/2020   Procedure: ESOPHAGOGASTRODUODENOSCOPY (EGD) WITH PROPOFOL;  Surgeon: Carol Ada, MD;  Location: [facility];  Service: Endoscopy;  Laterality: N/A;   pilonidal cyst removal      RIGHT HEART CATH N/A 12/13/2020   Procedure: RIGHT HEART CATH;  Surgeon: Larey Dresser, MD;  Location: [facility];  Service: Cardiovascular;  Laterality: N/A;   TOTAL KNEE ARTHROPLASTY Left 09/13/2014   Procedure: LEFT TOTAL KNEE ARTHROPLASTY;  Surgeon: Mauri Pole, MD;  Location: [facility];  Service: Orthopedics;  Laterality: Left;     Social History:   reports that he quit smoking about 14 years ago. His smoking use included cigarettes. He started smoking about 66 years ago. He has a 50.00 pack-year  smoking history. He has never used smokeless tobacco. He reports that he does not currently use alcohol after a past usage of about 6.0 standard drinks per week. He reports that he does not use drugs.   Family History:  His family history includes Heart attack (age of onset: 40) in his brother.   Allergies Allergies  Allergen Reactions   Other Swelling    Beer- Swelling    Sunflower Oil Swelling   Sulfa Antibiotics Rash     Home Medications  Prior to Admission medications   Medication Sig Start Date End Date Taking? Authorizing Provider  albuterol (VENTOLIN HFA) 108 (90 Base) MCG/ACT inhaler Inhale 1 puff into the lungs every 4 (four) hours as needed for shortness of breath.   Yes [provider]  allopurinol (ZYLOPRIM) 100 MG tablet Take 100 mg by mouth every morning.   Yes [provider]  apixaban (ELIQUIS) 5 MG TABS tablet Take 1 tablet (5 mg total) by mouth 2 (two) times daily. 02/28/21  Yes Larey Dresser, MD  Azelastine HCl 0.15 % SOLN Place 2 sprays into the nose daily as needed (allergies). 02/22/21  Yes [provider]  Cinnamon 500 MG capsule Take 500 mg by mouth every morning.   Yes [provider]  dapagliflozin propanediol (FARXIGA) 10 MG TABS tablet Take 1 tablet (10 mg total) by mouth daily. 04/18/21  Yes Elodia Florence., MD  doxazosin (CARDURA) 4 MG tablet Take 4 mg by mouth daily.   Yes [provider]  finasteride (PROSCAR) 5 MG tablet Take 5 mg by mouth at bedtime.   Yes [provider]  gabapentin (NEURONTIN) 100 MG capsule Take 200 mg by mouth 3 (three) times daily. 11/27/20  Yes [provider]  ipratropium (ATROVENT) 0.03 % nasal spray Place 1 spray into both nostrils daily as needed (allergies). 03/29/21  Yes [provider]  magnesium oxide (MAG-OX) 400 (240 Mg) MG tablet Take 800 mg by mouth daily. 02/10/21  Yes [provider]  midodrine (PROAMATINE) 5 MG tablet Take 2 tablets  (10 mg total) by mouth 3 (three) times daily with meals. 04/17/21 05/17/21 Yes Elodia Florence., MD  OXYGEN Inhale 2 L into the lungs as needed for shortness of breath.   Yes [provider]  pantoprazole (PROTONIX) 40 MG tablet Take 40 mg by mouth every morning.  Yes [provider]  potassium chloride (KLOR-CON) 10 MEQ tablet Take 2 tablets (20 mEq total) by mouth 2 (two) times daily. 02/28/21  Yes Larey Dresser, MD  Riociguat (ADEMPAS) 1.5 MG TABS Take 1.5 mg by mouth in the morning, at noon, and at bedtime. 04/17/21  Yes Elodia Florence., MD  Tiotropium Bromide Monohydrate (SPIRIVA RESPIMAT) 1.25 MCG/ACT AERS Inhale 2 puffs into the lungs every evening.   Yes [provider]  torsemide (DEMADEX) 20 MG tablet Take 4 tablets (80 mg total) by mouth every morning AND 3 tablets (60 mg total) every evening. Take one extra tablet ( 20 mg ) for extra 3 lbs in one day.. 04/18/21 05/18/21  Elodia Florence., MD   CRITICAL CARE Performed by: Omar Person   Total critical care time: 52 minutes  Critical care time was exclusive of separately billable procedures and treating other patients.  Critical care was necessary to treat or prevent imminent or life-threatening deterioration.  Critical care was time spent personally by me on the following activities: development of treatment plan with patient and/or surrogate as well as nursing, discussions with consultants, evaluation of patient's response to treatment, examination of patient, obtaining history from patient or surrogate, ordering and performing treatments and interventions, ordering and review of laboratory studies, ordering and review of radiographic studies, pulse oximetry and re-evaluation of patient's condition.

## 2021-04-24 NOTE — Progress Notes (Addendum)
Pharmacy Antibiotic Note  Joshua [name] is a 85 y.o. male admitted on 04/19/2021 with sepsis.  Pharmacy has been consulted for zosyn dosing.  Scr 1.77 (CrCl 37 mL/min). Afebrile. Concern for aspiration PNA vs UTI.   Plan: Cefepime 2g IV every 12 hours Vancomycin 2g IV once then 1250 mg IV every 24 hours (estAUC 502) Monitor renal fx, cx results, clinical pic   Height: _0  (190.5 cm) Weight: 96.6 kg (212 lb 15.4 oz) IBW/kg (Calculated) : 84.5  Temp (24hrs), Avg:98.4 F (36.9 C), Min:98.1 F (36.7 C), Max:98.7 F (37.1 C)  Recent Labs  Lab 04/19/21 0915 04/19/21 1255 04/20/21 0119 04/21/21 0045 04/22/21 0100 04/23/21 0016 04/24/21 0027  WBC 12.1*  --  8.8 4.7  --   --   --   CREATININE 3.69*   < > 3.70* 2.84* 2.08* 1.58* 1.77*   < > = values in this interval not displayed.    Estimated Creatinine Clearance: 37.8 mL/min (A) (by C-G formula based on SCr of 1.77 mg/dL (H)).    Allergies  Allergen Reactions   Other Swelling    Beer- Swelling    Sunflower Oil Swelling   Sulfa Antibiotics Rash    Antimicrobials this admission: Cefepime 9/6 >> Vancomycin 9/6>>  Dose adjustments this admission: N/A  Microbiology results: 9/2 BCx: ngtd  9/6 Bcx: sent  9/6 UCx: sent  9/6 COVID PCR: neg    Thank you for allowing pharmacy to be a part of this patient's care.  Antonietta Jewel, PharmD, [place] Clinical Pharmacist  Phone: [phone] 04/24/2021 9:07 AM  Please check AMION for all [place] phone numbers After 10:00 PM, call [place] [phone]

## 2021-04-24 NOTE — Procedures (Signed)
Central Venous Catheter Insertion Procedure Note  KAVONTAE PRITCHARD  [id number]  Nov 11, 1935  Date:04/24/21  Time:10:16 AM   Provider Performing:Michiah Masse Jerilynn Mages Dewaine Oats   Procedure: Insertion of Non-tunneled Central Venous [phone]) with US guidance (47159)   Indication(s) Medication administration  Consent Risks of the procedure as well as the alternatives and risks of each were explained to the patient and/or caregiver.  Consent for the procedure was obtained and is signed in the bedside chart  Anesthesia Topical only with 1% lidocaine   Timeout Verified patient identification, verified procedure, site/side was marked, verified correct patient position, special equipment/implants available, medications/allergies/relevant history reviewed, required imaging and test results available.  Sterile Technique Maximal sterile technique including full sterile barrier drape, hand hygiene, sterile gown, sterile gloves, mask, hair covering, sterile ultrasound probe cover (if used).  Procedure Description Area of catheter insertion was cleaned with chlorhexidine and draped in sterile fashion.  With real-time ultrasound guidance a central venous catheter was placed into the right internal jugular vein. Nonpulsatile blood flow and easy flushing noted in all ports.  The catheter was sutured in place and sterile dressing applied.  Complications/Tolerance None; patient tolerated the procedure well. Chest X-ray is ordered to verify placement for internal jugular or subclavian cannulation.   Chest x-ray is not ordered for femoral cannulation.  EBL Minimal  Specimen(s) None

## 2021-04-24 NOTE — Significant Event (Signed)
Rapid Response Event Note   Reason for Call : Called for tachycardia and desatting  Initial Focused Assessment:  Patient seen earlier on rounds for desatting after vomiting. Sat patient upright in bed, provided increased oxygen support, and oral suctioning. Pt sats increased to high 90's, not in any distress.   Called again for desatting and tachycardia. On my arrival, patient has eyes closed, easily responsive to voice and is able to answer questions appropriately. Patient denies SOB, endorses intermittent chest pain but currently denies it. His lungs sounds are overall more diminished than when I assessed him earlier, not overtly rhonchus throughout but some rhonchi in the upper airway. He has an paradoxical breathing pattern. He has a good cough and is able to clear small amounts of tan/brown and clear sputum. Belly is distended but soft, denies pain but grunts with deep palpation. Skin is warm and dry to the touch.   VS: HR 130, BP 112/63(73), Sats 96%, RR 19, T 98.6.   Interventions:  CXR and EKG done prior to RRT arrival ABG: pH 7.39, CO2 60.5, HCO3 35.9, PO2 38.5, Sat 69.9% (this is a VBG) MD ordered troponins and ABD xray  Plan of Care:  Obtain trops and abd xray- notify MD of abnormalities Continue to monitor patient closely [facility] RRT if further assistance is needed  Event Summary:   MD Notified: Prior to RRT arrival, Dr. Tonie Griffith coming to bedside Call Time: 0149 Arrival Time: 8366 End Time: 2947  Monna Fam, RN

## 2021-04-24 NOTE — Progress Notes (Signed)
[place] paged by RN at pt.'s dtr. Charlene's request to provide prayer support as pt. is transferred from 4E to [facility] CVICU.  Dtr. shared that pt. was previously admitted for 6 days two weeks ago for low blood pressure related to his CHF; he was readmitted this past Thursday.  Pt.'s wife [name] is en route.  [facility] and dtr. prayed for pt. to experience a sense of God's nearness and for God's protection and healing as pt. is transferred to [facility]; dtr. and [place] also prayed for medical staff taking over pt.'s care on [place].  No further needs at this time; dtr. plans to call pt.'s pastor to come support them.  she is aware that chaplains are available if needed.  Lindaann Pascal, Chaplain Pager: [phone]

## 2021-04-24 NOTE — Progress Notes (Signed)
PROGRESS NOTE    Joshua [name]  BWL:[id number] DOB: Jul 25, 1936 DOA: 04/19/2021 PCP: [name], DO    Brief Narrative:  Joshua [name] is an 85 year old male with past medical history significant for chronic diastolic CHF, chronic RV failure, hypotension on midodrine, COPD on 3 L nasal cannula at baseline, BPH, CKD stage IIIa, PE on Eliquis who presented to [facility] ED on 9/1 with intractable nausea/vomiting.  Patient reports onset Tuesday night with at least 20 episodes of emesis on Wednesday.  Denies hematemesis and no associated abdominal pain.  Patient also reports decreased urine output, self caths about 3 times a day.  Denies any dysuria, or color change within his urine.  Further denies fevers, chills, shortness of breath, coughing, no chest pain/palpitations, no diarrhea/constipation.  Denies any increased leg swelling.  Recently discharged on 8/30 for CHF exacerbation.  Was initially started on midodrine and [place].  In the ED, temperature 99.1 F, HR 87, RR 20, BP 61/41, SPO2 93% on 3 L nasal cannula.  Sodium 135, potassium 5.5, chloride 89, CO2 29, glucose 117, BUN 41, creatinine 3.69.  WBC 12.1, hemoglobin 1214.7, platelets 117.  Lipase 24, AST 416, ALT 197.  BNP 2618.  High sensitive troponin 247>499.  Chest x-ray with no focal opacity, increased interstitial coarsening/mild edema.  EKG with normal sinus rhythm, rate 83, QTc 495.  CT abdomen/pelvis without contrast with abrupt narrowing third portion of duodenum between aneurysmal abdominal aorta or and SMV with upstream dilation suggestive of nutcracker phenomenon, no evidence of high-grade bowel obstruction, infrarenal abdominal aortic aneurysm 3.5 cm, moderate hiatal hernia, thickened bladder, cardiomegaly, small ventral abdominal hernia containing a loop of nonobstructed small bowel.  Cardiology and general surgery were consulted.  Duration consulted for further evaluation and management of intractable nausea vomiting, hypotension,  elevated troponin, transaminitis.   Assessment & Plan:   Principal Problem:   Intractable nausea and vomiting Active Problems:   History of pulmonary embolism   Chronic respiratory failure with hypoxia (HCC)   COPD (chronic obstructive pulmonary disease) (HCC)   BPH with urinary obstruction   Chronic right-sided heart failure (HCC)   Acute renal failure superimposed on stage 3 chronic kidney disease (HCC)   Hyperkalemia   Transaminitis   Elevated troponin   Duodenal anomaly   Hypotension Patient recently admitted and discharged following CHF exacerbation with noted hypotension with recent started on midodrine.  Patient was noted to have significant hypotension on arrival with SBP 61. Midodrine increased to 15 mg p.o. 3 times daily; but now significantly hypotensive and lethargic this morning.  Discussed with advanced heart failure team, Dr. Aundra Dubin and will be transferring to ICU with initiation of Levophed drip.  Concern for UTI versus aspiration pneumonia Patient self catheterizes, also concern for aspiration ammonia given his intractable nausea/vomiting with chronic hiccups.  Chest x-ray this morning unrevealing.  Temperature 100 F axillary. --CBC, procalcitonin, blood cultures x2, Urinalysis/urine culture ordered --Start empiric antibiotics with vancomycin/cefepime --Transferring to the ICU as above  Intractable nausea/vomiting Duodenal stenosis Intractable hiccups Patient presenting to ED with persistent intractable nausea/vomiting.  CT abdomen/pelvis notable for abrupt narrowing third portion of duodenum without high-grade obstruction; concern for possible nutcracker phenomenon.  Denies any abdominal discomfort.  Upper GI series with no signs of significant duodenal narrowing/stricture.  General surgery now signed off. --soft diet --Zofran/Reglan as needed --Aspiration precautions  Transaminitis: Improving These are new findings with LFTs that were normal on 8/25.  No  history of cirrhosis or portal hypertension's.  No abnormal  findings noted on CT abdomen/pelvis other than some scattered ascites.  Acute hepatitis panel negative.  High suspicion from shock liver with significant hypotension. --AST 416>1475>737>558>347>173 (20 on 8/27) --ALT 197>879>707>703>584>459 (12 on 8/27) --Avoid hepatotoxins, especially as Tylenol/statins --monitor LFTs daily  Elevated troponin High sensitive troponin [phone].  Etiology likely secondary to demand ischemia from acute illness/hypotension.  Denies chest pain. --Cardiology following, continue to monitor on telemetry  Acute renal failure on CKD stage IIIa: Improving On arrival, patient's creatinine elevated at 3.87.  Baseline Cr 1.5.  Etiology likely secondary to ATN from significant hypotension versus prerenal azotemia from dehydration with intractable nausea vomiting; also concern for cardiorenal syndrome. FeNa = 0.8%; which is more consistent with a prerenal azotemia. --Cr 3.87>3.70>2.84>2.08>1.58>1.77 --Avoid nephrotoxins, renal dose all medications --BMP daily  Hyperkalemia: Resolved Potassium elevated at 6.2 on admission.  Patient was given NovoLog, dextrose and Lokelma.  Etiology likely secondary to acute renal failure as above vs home K supplement. --K 6.2>>>3.6 --Home potassium supplement has been discontinued --Monitor on telemetry --Follow BMP daily  Chronic diastolic congestive heart failure Pulmonary hypertension Chronic right-sided heart failure, End Stage TTE 09/2020 with LVEF 55-60%, fairly reduced RV systolic function, RV severely enlarged, moderate pulmonary artery systolic pressure.  Recently hospitalized and discharged on 8/30 for CHF exacerbation, with 20 pound weight loss.  At baseline on torsemide 80 mg p.o. every morning and 6 mg p.o. every afternoon. --Cardiology following, appreciate assistance --Net negative 1.7L past 24h and net negative 5.1L since admission --restarted torsemide  68m BID on 9/4; but now holding due to hypotension now requiring vasopressors --Strict I's and O's and daily weights  COPD, oxygen dependent Chronic hypoxic respiratory failure At baseline on 3 L nasal cannula.  PFTs with severe obstruction and moderate restriction on 6/22.  Oxygen requirement increased overnight, up to 5 L nasal cannula.  Concern for possible aspiration pneumonia as above. --As above --Incruse Ellipta 62.5 mcg 1 puff daily --Albuterol neb every 4 hours as needed shortness of breath/wheezing --Continue supplemental oxygen, maintain SPO2 greater than 88%.  Hx pulmonary embolism --Eliquis 5 mg p.o. twice daily  Hx CTEPH --Riociguat 1.533mBID  Hx OSA --Intolerant to CPAP  Infrarenal abdominal aortic aneurysm Measuring up to 3.5 cm on imaging.  Recommend repeat ultrasound every 2 years.  GERD: Protonix 40 mg p.o. daily  BPH/chronic urinary retention Patient reports self catheterize up to 3 times daily. --Finasteride 5 mg p.o. nightly --Discontinued home Cardura 2/2 hypotension  Goals of care: Patient with advanced age, multiple comorbidities with associated of adult failure to thrive.  Continues with chronic nausea/vomiting, intractable hiccups, end-stage RV failure per cardiology and persistent hypotension despite high-dose midodrine.  Patient's daughter wishes to continue aggressive measures.  Currently being transferred to ICU for higher level care and initiation of vasopressors per cardiology given persistent hypotension and lethargy. --Palliative care consulted for assistance with medical decision making and goals of care.   DVT prophylaxis: Place TED hose Start: 04/19/21 1341 apixaban (ELIQUIS) tablet 5 mg    Code Status: Full Code Family Communication: Updated family present at bedside this morning  Disposition Plan:  Level of care: ICU Status is: Inpatient  Remains inpatient appropriate because:Ongoing diagnostic testing needed not appropriate for  outpatient work up, Unsafe d/c plan, IV treatments appropriate due to intensity of illness or inability to take PO, and Inpatient level of care appropriate due to severity of illness  Dispo: The patient is from: Home  Anticipated d/c is to: Home              Patient currently is not medically stable to d/c.   Difficult to place patient No   Consultants:  Cardiology/heart failure team, Dr. Aundra Dubin, Dr. Haroldine Laws General surgery, Dr. Georgette Dover - signed off 9/3  Procedures:  None  Antimicrobials:  None   Subjective: Patient seen examined at bedside, lethargic.  Daughter present.  Overnight concern for possible aspiration event.  Discussed with cardiology this morning, recommending transfer to intensive care unit for initiation of vasopressors Levophed and will start empiric antibiotics for concern of UTI versus aspiration pneumonia.  Objective: Vitals:   04/24/21 0900 04/24/21 0904 04/24/21 0918 04/24/21 0920  BP: (!) 70/45 (!) 73/45 (!) 72/49 (!) 80/51  Pulse: 96 98 99 99  Resp: 14 17 (!) 24 14  Temp:      TempSrc:      SpO2: 100% 100% 100% 100%  Weight:      Height:        Intake/Output Summary (Last 24 hours) at 04/24/2021 0955 Last data filed at 04/24/2021 0819 Gross per 24 hour  Intake 720 ml  Output 1350 ml  Net -630 ml   Filed Weights   04/20/21 0417 04/21/21 0019 04/24/21 0511  Weight: 100.8 kg 100.3 kg 96.6 kg    Examination:  General exam: Appears lethargic, poorly responsive Respiratory system: Clear to auscultation. Respiratory effort normal.  On 5 L nasal cannula  Cardiovascular system: S1 & S2 heard, RRR. No JVD, murmurs, rubs, gallops or clicks. No pedal edema. Gastrointestinal system: Abdomen is nondistended, soft and nontender. No organomegaly or masses felt. Normal bowel sounds heard. Central nervous system: Lethargic Extremities: Moving all extremities independently Skin: No rashes, lesions or ulcers Psychiatry: Unable to assess due to  current mental status    Data Reviewed: I have personally reviewed following labs and imaging studies  CBC: Recent Labs  Lab 04/19/21 0915 04/20/21 0119 04/21/21 0045 04/24/21 0850  WBC 12.1* 8.8 4.7 12.5*  NEUTROABS 10.8*  --   --   --   HGB 14.7 13.8 13.9 14.6  HCT 45.2 42.7 41.6 44.6  MCV 89.2 87.3 85.6 86.6  PLT 117* 112* 112* 789*   Basic Metabolic Panel: Recent Labs  Lab 04/19/21 1718 04/20/21 0119 04/21/21 0045 04/22/21 0100 04/23/21 0016 04/24/21 0027  NA  --  134* 134* 134* 137 136  K  --  4.6 3.8 3.7 3.5 3.6  CL  --  89* 95* 93* 93* 92*  CO2  --  33* 31 31 33* 35*  GLUCOSE  --  78 71 110* 89 117*  BUN  --  50* 49* 38* 26* 26*  CREATININE  --  3.70* 2.84* 2.08* 1.58* 1.77*  CALCIUM  --  9.4 8.7* 9.1 9.4 9.5  MG 2.1  --   --   --   --   --    GFR: Estimated Creatinine Clearance: 37.8 mL/min (A) (by C-G formula based on SCr of 1.77 mg/dL (H)). Liver Function Tests: Recent Labs  Lab 04/20/21 0119 04/21/21 0045 04/22/21 0100 04/23/21 0016 04/24/21 0027  AST 1,475* 737* 558* 347* 173*  ALT 879* 707* 703* 584* 459*  ALKPHOS 95 86 92 86 90  BILITOT 1.8* 1.8* 2.6* 2.5* 2.2*  PROT 6.2* 5.7* 6.2* 6.4* 6.8  ALBUMIN 2.8* 2.6* 2.8* 2.9* 3.1*   Recent Labs  Lab 04/19/21 0915  LIPASE 24   No results for input(s): AMMONIA in the last  168 hours. Coagulation Profile: No results for input(s): INR, PROTIME in the last 168 hours. Cardiac Enzymes: No results for input(s): CKTOTAL, CKMB, CKMBINDEX, TROPONINI in the last 168 hours. BNP (last 3 results) No results for input(s): PROBNP in the last 8760 hours. HbA1C: No results for input(s): HGBA1C in the last 72 hours. CBG: Recent Labs  Lab 04/19/21 1506 04/24/21 0856  GLUCAP 92 122*   Lipid Profile: No results for input(s): CHOL, HDL, LDLCALC, TRIG, CHOLHDL, LDLDIRECT in the last 72 hours. Thyroid Function Tests: No results for input(s): TSH, T4TOTAL, FREET4, T3FREE, THYROIDAB in the last 72  hours. Anemia Panel: No results for input(s): VITAMINB12, FOLATE, FERRITIN, TIBC, IRON, RETICCTPCT in the last 72 hours. Sepsis Labs: Recent Labs  Lab 04/20/21 0119 04/21/21 0045  PROCALCITON 3.35 2.11    Recent Results (from the past 240 hour(s))  Resp Panel by RT-PCR (Flu A&B, Covid) Nasopharyngeal Swab     Status: None   Collection Time: 04/19/21  9:34 AM   Specimen: Nasopharyngeal Swab; Nasopharyngeal(NP) swabs in vial transport medium  Result Value Ref Range Status   SARS Coronavirus 2 by RT PCR NEGATIVE NEGATIVE Final    Comment: (NOTE) SARS-CoV-2 target nucleic acids are NOT DETECTED.  The SARS-CoV-2 RNA is generally detectable in upper respiratory specimens during the acute phase of infection. The lowest concentration of SARS-CoV-2 viral copies this assay can detect is 138 copies/mL. A negative result does not preclude SARS-Cov-2 infection and should not be used as the sole basis for treatment or other patient management decisions. A negative result may occur with  improper specimen collection/handling, submission of specimen other than nasopharyngeal swab, presence of viral mutation(s) within the areas targeted by this assay, and inadequate number of viral copies(<138 copies/mL). A negative result must be combined with clinical observations, patient history, and epidemiological information. The expected result is Negative.  Fact Sheet for Patients:  EntrepreneurPulse.com.au  Fact Sheet for Healthcare Providers:  IncredibleEmployment.be  This test is no t yet approved or cleared by the [place] FDA and  has been authorized for detection and/or diagnosis of SARS-CoV-2 by FDA under an Emergency Use Authorization (EUA). This EUA will remain  in effect (meaning this test can be used) for the duration of the COVID-19 declaration under Section 564(b)(1) of the Act, 21 U.S.C.section 360bbb-3(b)(1), unless the authorization is  terminated  or revoked sooner.       Influenza A by PCR NEGATIVE NEGATIVE Final   Influenza B by PCR NEGATIVE NEGATIVE Final    Comment: (NOTE) The Xpert Xpress SARS-CoV-2/FLU/RSV plus assay is intended as an aid in the diagnosis of influenza from Nasopharyngeal swab specimens and should not be used as a sole basis for treatment. Nasal washings and aspirates are unacceptable for Xpert Xpress SARS-CoV-2/FLU/RSV testing.  Fact Sheet for Patients: EntrepreneurPulse.com.au  Fact Sheet for Healthcare Providers: IncredibleEmployment.be  This test is not yet approved or cleared by the [place] FDA and has been authorized for detection and/or diagnosis of SARS-CoV-2 by FDA under an Emergency Use Authorization (EUA). This EUA will remain in effect (meaning this test can be used) for the duration of the COVID-19 declaration under Section 564(b)(1) of the Act, 21 U.S.C. section 360bbb-3(b)(1), unless the authorization is terminated or revoked.  Performed at [facility], [place] [address] [place], [place] [address]   Culture, blood (routine x 2)     Status: None (Preliminary result)   Collection Time: 04/20/21 11:50 AM   Specimen: BLOOD RIGHT ARM  Result Value Ref Range Status   Specimen Description BLOOD RIGHT ARM  Final   Special Requests   Final    BOTTLES DRAWN AEROBIC AND ANAEROBIC Blood Culture adequate volume   Culture   Final    NO GROWTH 4 DAYS Performed at [facility], [address], [place], [place] [address]    Report Status PENDING  Incomplete  Culture, blood (routine x 2)     Status: None (Preliminary result)   Collection Time: 04/20/21 11:56 AM   Specimen: BLOOD LEFT HAND  Result Value Ref Range Status   Specimen Description BLOOD LEFT HAND  Final   Special Requests   Final    BOTTLES DRAWN AEROBIC ONLY Blood Culture results may not be optimal due to an inadequate volume of blood received in culture bottles    Culture   Final    NO GROWTH 4 DAYS Performed at [facility], [place] [address], [place],  [address]    Report Status PENDING  Incomplete         Radiology Studies: DG Abd 1 View  Result Date: 04/24/2021 CLINICAL DATA:  Rule out obstruction EXAM: ABDOMEN - 1 VIEW COMPARISON:  CT 04/19/2021.  Upper GI 04/20/2021 FINDINGS: Contrast material is seen within the colon which is decompressed. Mild gaseous distention of central bowel loops, likely small bowel loops. Cannot exclude partial small bowel obstruction. No organomegaly or free air. IMPRESSION: Contrast material within the colon. Mildly prominent central small bowel loops. Findings may reflect partial small bowel obstruction. Electronically Signed   By: Rolm Baptise M.D.   On: 04/24/2021 03:55   DG CHEST PORT 1 VIEW  Result Date: 04/24/2021 CLINICAL DATA:  Weakness, hypotension, tachycardia, nausea, vomiting EXAM: PORTABLE CHEST 1 VIEW COMPARISON:  None. FINDINGS: Opacification of the left lung base likely relates to underpenetration. The lungs are clear. No pneumothorax or pleural effusion. Cardiomegaly is stable. The central pulmonary arteries are enlarged in keeping with changes of pulmonary arterial hypertension. No overt pulmonary edema. No acute bone abnormality. IMPRESSION: No active disease. Stable cardiomegaly. Stable enlargement of the central pulmonary arteries. Electronically Signed   By: Fidela Salisbury M.D.   On: 04/24/2021 01:50   [place] EKG SITE RITE  Result Date: 04/24/2021 If Site Rite image not attached, placement could not be confirmed due to current cardiac rhythm.       Scheduled Meds:  allopurinol  100 mg Oral q morning   apixaban  5 mg Oral BID   finasteride  5 mg Oral QHS   gabapentin  200 mg Oral TID   midodrine  15 mg Oral TID WC   pantoprazole  40 mg Oral q morning   Riociguat  1.5 mg Oral TID   sodium chloride flush  3 mL Intravenous Q12H   umeclidinium bromide  1 puff Inhalation QPM    Continuous Infusions:  sodium chloride     sodium chloride 250 mL (04/24/21 0912)   ceFEPime (MAXIPIME) IV     norepinephrine (LEVOPHED) Adult infusion 5 mcg/min (04/24/21 0918)   [START ON 04/25/2021] vancomycin     vancomycin       LOS: 5 days    Time spent: 39 minutes spent on chart review, discussion with nursing staff, consultants, updating family and interview/physical exam; more than 50% of that time was spent in counseling and/or coordination of care.    Jenaya Saar J [place] DO [facility] Available via Epic secure chat 7am-7pm After these hours, please refer to  coverage provider listed on [email or web address] 04/24/2021, 9:55 AM

## 2021-04-24 NOTE — Progress Notes (Addendum)
Patient ID: Joshua [name], male   DOB: October 08, 1935, 85 y.o.   MRN: [id number]     Advanced Heart Failure Rounding Note  PCP-Cardiologist: Sherren Mocha, MD  HF: Dr. Aundra Dubin  Subjective:    Patient admitted 09/01 with N, V, hypotension and AKI on CKD after recent admission for a/c diastolic HF/RV failure. Gave IV fluids and Midodrine increased to 15 mg TID to support BP.  Overnight patient had episode of emesis while sleeping. ABG - pH 7.31/pCO2 60/pO2 38/bicarb 36. Desaturations noted, 02 up to 5L. Sinus tachycardia noted. Given 20 mg lasix IV. Abdominal x-ray with mild distension central bowel loops, possible partial SBO.  More lethargic this am. Difficult to arouse. SBP 70s/40s. Poor po intake.   Creatinine up slightly, 1.58 > 1.77. Started back on torsemide 60 mg po bid 09/04.  -1.7 L last 24 hrs. Weight down 9 lb since 09/03, daily weight not recorded.   CT abdomen pelvis 9/1: abrupt narrowing third portion of duodenum as it passes between abdominal aorta and SMV with upstream dilatation suggestive of nutcracker phenomenon  Upper GI series, 09/02:  IMPRESSION: 1. No signs of significant duodenal narrowing/stricture. 2. Increased caliber of the duodenum is noted to the level of the ligament of Treitz. However, the proximal jejunal loops are also mildly increased in caliber. Findings may reflect sequelae of mild low-grade partial small-bowel obstruction or proximal small bowel ileus.  Objective:   Weight Range: 96.6 kg Body mass index is 26.62 kg/m.   Vital Signs:   Temp:  [98 F (36.7 C)-98.7 F (37.1 C)] 98.7 F (37.1 C) (09/06 0429) Pulse Rate:  [92-103] 99 (09/06 0736) Resp:  [13-20] 16 (09/06 0736) BP: (86-121)/(56-80) 86/56 (09/06 0736) SpO2:  [98 %-100 %] 100 % (09/06 0736) Weight:  [96.6 kg] 96.6 kg (09/06 0511) Last BM Date:  (prior to admission)  Weight change: Filed Weights   04/20/21 0417 04/21/21 0019 04/24/21 0511  Weight: 100.8 kg 100.3 kg 96.6 kg     Intake/Output:   Intake/Output Summary (Last 24 hours) at 04/24/2021 0807 Last data filed at 04/24/2021 0656 Gross per 24 hour  Intake 960 ml  Output 2050 ml  Net -1090 ml      Physical Exam    General: Daughter at bedside. Lethargic. Neck: JVP not elevated, no thyromegaly or thyroid nodule.  Lungs: Diminished CV: Nondisplaced PMI.  Regular rhythm. S1, S2. Tachy. No murmur. Abdomen: Soft, nontender, no hepatosplenomegaly, no distention.  Skin: Intact without lesions or rashes.  Neurologic: Difficult to arouse. Psych: Normal affect. Extremities: No clubbing or cyanosis. No edema. HEENT: Normal.    Telemetry   NSR 90s-100s Personally reviewed  Labs    CBC No results for input(s): WBC, NEUTROABS, HGB, HCT, MCV, PLT in the last 72 hours.  Basic Metabolic Panel Recent Labs    04/23/21 0016 04/24/21 0027  NA 137 136  K 3.5 3.6  CL 93* 92*  CO2 33* 35*  GLUCOSE 89 117*  BUN 26* 26*  CREATININE 1.58* 1.77*  CALCIUM 9.4 9.5   Liver Function Tests Recent Labs    04/23/21 0016 04/24/21 0027  AST 347* 173*  ALT 584* 459*  ALKPHOS 86 90  BILITOT 2.5* 2.2*  PROT 6.4* 6.8  ALBUMIN 2.9* 3.1*   No results for input(s): LIPASE, AMYLASE in the last 72 hours.  Cardiac Enzymes No results for input(s): CKTOTAL, CKMB, CKMBINDEX, TROPONINI in the last 72 hours.  BNP: BNP (last 3 results) Recent Labs    04/12/21  1435 04/19/21 0915 04/22/21 0958  BNP 1,597.1* 2,618.0* 1,156.1*    ProBNP (last 3 results) No results for input(s): PROBNP in the last 8760 hours.   D-Dimer No results for input(s): DDIMER in the last 72 hours. Hemoglobin A1C No results for input(s): HGBA1C in the last 72 hours. Fasting Lipid Panel No results for input(s): CHOL, HDL, LDLCALC, TRIG, CHOLHDL, LDLDIRECT in the last 72 hours. Thyroid Function Tests No results for input(s): TSH, T4TOTAL, T3FREE, THYROIDAB in the last 72 hours.  Invalid input(s): FREET3  Other  results:   Imaging    DG Abd 1 View  Result Date: 04/24/2021 CLINICAL DATA:  Rule out obstruction EXAM: ABDOMEN - 1 VIEW COMPARISON:  CT 04/19/2021.  Upper GI 04/20/2021 FINDINGS: Contrast material is seen within the colon which is decompressed. Mild gaseous distention of central bowel loops, likely small bowel loops. Cannot exclude partial small bowel obstruction. No organomegaly or free air. IMPRESSION: Contrast material within the colon. Mildly prominent central small bowel loops. Findings may reflect partial small bowel obstruction. Electronically Signed   By: Rolm Baptise M.D.   On: 04/24/2021 03:55   DG CHEST PORT 1 VIEW  Result Date: 04/24/2021 CLINICAL DATA:  Weakness, hypotension, tachycardia, nausea, vomiting EXAM: PORTABLE CHEST 1 VIEW COMPARISON:  None. FINDINGS: Opacification of the left lung base likely relates to underpenetration. The lungs are clear. No pneumothorax or pleural effusion. Cardiomegaly is stable. The central pulmonary arteries are enlarged in keeping with changes of pulmonary arterial hypertension. No overt pulmonary edema. No acute bone abnormality. IMPRESSION: No active disease. Stable cardiomegaly. Stable enlargement of the central pulmonary arteries. Electronically Signed   By: Fidela Salisbury M.D.   On: 04/24/2021 01:50     Medications:     Scheduled Medications:  allopurinol  100 mg Oral q morning   apixaban  5 mg Oral BID   finasteride  5 mg Oral QHS   gabapentin  200 mg Oral TID   midodrine  15 mg Oral TID WC   pantoprazole  40 mg Oral q morning   Riociguat  1.5 mg Oral TID   sodium chloride flush  3 mL Intravenous Q12H   torsemide  60 mg Oral BID   umeclidinium bromide  1 puff Inhalation QPM    Infusions:  sodium chloride      PRN Medications: sodium chloride, albuterol, ipratropium, metoCLOPramide (REGLAN) injection, ondansetron **OR** ondansetron (ZOFRAN) IV, sodium chloride flush   Assessment/Plan  Hypotension/lethargy: - Hypotensive  with SBP in 70s today. More lethargic. Episodes of emesis overnight while sleeping. Desaturations. Per primary team, axillary temp around 100F. - ? Possible aspiration pneumonia. Chest x-ray today with no acute process - Evidence of possible SBO on abdominal Xray and upper GI series.  - concern for possible UTI? Self -cath's and concern for hygiene. Place foley. Initial UA negative. BC with NG X 2 on admit. Repeat UA and BC X 2. Procalcitonin. Check CBC. - started on empiric abx per primary team - Has end-stage RV frailure. LFTs elevated this admit d/t shock liver. Check ammonia level. - Will transfer to cardiac ICU for closer monitoring and initiation of Levophed. Place PICC line. Discussed with primary team.   2. Chronic HFpEF/RV failure: Echo (2/22) with EF 55-60%, IV septum flattened, severe RV enlargement, severely decreased RV function, PASP 57 mmHg.  RHC in 4/22 showed normal filling pressures. He has end-stage RV failure.  Diuretics initially held this admission with over-diuresis.  - Restarted torsemide at 60 mg po bid,  weight down 9 lb since 09/03. Does not appear volume uploaded. Will hold diuretic especially with hypotension.    - Midodrine increased to 15 mg TID to support BP - TED hose - Co-ox and CVP monitoring once central access obtained.  3. Pulmonary HTN: PAH noted on 4/22 RHC with PVR 6.1 WU.  This appears to be multifactorial with OSA, emphysema, hypoxemia with ambulation, and a suspected chronic PE involving the right middle lobe (group 3 and group 4 PH). Given the suspected mixed etiology with only 1 area of chronic thromboembolism (right middle lobe) as well as age, do not think that pulmonary thromboendarterectomy would be indicated.  Rheumatologic serologic workup was negative.  PFTs showed severe obstruction and moderate restriction, suggesting significant COPD. - Continue riociguat for CTEPH, 1.5 mg bid.  - O2 sats now stable on 5L, 02 needs up overnight during episodes of  emesis - On chronic 02 at home  4. AKI on Stage III CKD: Baseline SCr ~1.6. Scr 1.47 at d/c on 08/30. Up to 3.69 on admit -> 1.77 today.  Likely AKI due to hypovolemia and RV failure with nausea/vomiting poor po intake and ongoing diuretic use.   - continue midodrine 15 mg TID for BP support.  - Hold diuretics  5. Elevated troponin:  HS trop 247 > 499 > 717 > 893. No chest pain.  Likely due to demand ischemia from hypovolemia/low BP.   6. Transaminitis/shock liver from hypotension: Improving. No significant abnormality identified on RUQ [place]. Small amount of perihepatic ascites. Ammonia level as above due to change in mentation.  7. Duodenal abnormality:  CT abdomen 04/19/21 with narrowing distal third duodenum with upstream dilatation. 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  - has been seen by [facility]. Not surgical candidate. Symptoms improved  8. OSA: Untreated.  Insurance would apparently not cover home sleep study.  Need to address this as outpatient. Can he get home SS (wants this) versus lab sleep study?  9. Emphysema: Prior smoker.  Emphysema on CT and severe obstruction on PFTs - He uses home oxygen   10. Chronic PE: on Eliquis  11. BPH with urinary retention: He has had issues with this in the past, often self caths 3-4x/day. Concern for hygiene? Place foley. - On Flomax.  - Had outpatient f/u scheduled but missed d/t admission  12. Hiccups: Have been intractable.  Better this am, now on Reglan and gabapentin.     Length of Stay: 5  FINCH, LINDSAY N, PA-C  04/24/2021, 8:07 AM  Advanced Heart Failure Team Pager [phone] (M-F; 7a - 5p)  Please contact [facility] for night-coverage after hours (5p -7a ) and weekends on [email or web address]  Patient seen with PA, agree with the above note.   Lethargic this morning, SBP in 70s.  Marked change from yesterday.  Axillary temp 100, no CBC this morning.  He diuresed  well yesterday.  CXR overnight did not show infiltrate or significant edema, abdominal XR concerning for possible partial SBO.  He will wake up with stimulation and follow commands.  ABG with hypercarbia, hypoxemia.   General: Lethargic Neck: No JVD, no thyromegaly or thyroid nodule.  Lungs: Clear to auscultation bilaterally with normal respiratory effort. CV: Nondisplaced PMI.  Heart regular S1/S2, no S3/S4, no murmur.  No peripheral edema.   Abdomen: Soft, nontender, no hepatosplenomegaly, no distention.  Skin: Intact without lesions or rashes.  Neurologic: Lethargic but awakens Extremities: No clubbing or  cyanosis.  HEENT: Normal.   Marked change today.  Axillary temp 100, hypotensive and lethargic.  I am concerned for the development of septic shock.  CXR does not show evidence for PNA/aspiration. AXR with possible partial SBO.  - Send blood cultures and urine cultures/UA.  I do worry about urosepsis with I/O cathing and h/o urinary retention.  - Start empiric abx per [facility] => Zosyn/vancomcyin.  - Send PCT, CBC, lactate.  - Place central access, can place PICC if this can be done in timely fashion.  Will need to follow CVP and co-ox.  Will need to start low dose norepinephrine now. Arterial line may be helpful.  - Continue midodrine.  - Will ask CCM to see and transfer to unit.   He continues to have some GI symptoms with partial SBO on imaging from "nutcracker syndrome" as above.  I am not sure how this fits in with current presentation, no evidence for aspiration on CXR.   Baseline end-stage RV failure.  Hold diuretics today with hypotension.  Follow CVP off central access when placed.   Discussed with daughter.   CRITICAL CARE Performed by: Loralie Champagne  Total critical care time: 40 minutes  Critical care time was exclusive of separately billable procedures and treating other patients.  Critical care was necessary to treat or prevent imminent or life-threatening  deterioration.  Critical care was time spent personally by me on the following activities: development of treatment plan with patient and/or surrogate as well as nursing, discussions with consultants, evaluation of patient's response to treatment, examination of patient, obtaining history from patient or surrogate, ordering and performing treatments and interventions, ordering and review of laboratory studies, ordering and review of radiographic studies, pulse oximetry and re-evaluation of patient's condition.  Loralie Champagne 04/24/2021 9:37 AM

## 2021-04-24 NOTE — Consult Note (Signed)
Palliative Care Consult Note                                  Date: 04/24/2021   Patient Name: Joshua [name]  DOB: 10-13-1935  MRN: [id number]  Age / Sex: 85 y.o., male  PCP: Joshua Morning, DO Referring Physician: Candee Furbish, MD  Reason for Consultation: Establishing goals of care  HPI/Patient Profile: Palliative Care consult requested for goals of care discussion in this 85 y.o. male  with past medical history of BPH (self cath), COPD (2L home oxygen), GERD, hypotension (midodrine), dCHF, CKD IIIa, PE (Eliquis), and sleep apnea. He was admitted on 04/19/2021 from home with intractable nausea/vomiting. Patient recently discharged on 8/30 for CHF exacerbation. During work-up BNP 2618, potassium 5.5, and BP 61/41. CT of abdomen/pelvis showed narrowing of duodenum and SMV with upstream dilation suggestive of nutcracker phenomenon.   Past Medical History:  Diagnosis Date   Arthritis    BPH (benign prostatic hyperplasia)    COPD (chronic obstructive pulmonary disease) (HCC)    Folliculitis    posterior scalp per office visit note of Joshua [name] 07/20/2014     GERD (gastroesophageal reflux disease)    Hypertension    PE (pulmonary thromboembolism) ([facility])    Phlebitis    right arm  at least 20 years ago    Pneumonia    hx of pneumonia as a child    Sleep apnea      Subjective:   This NP Joshua [name] reviewed medical records, received report from team, assessed the patient and then met at the patient's bedside with patient, his wife Joshua [name]), daughter (Joshua [name])/husband, and patient's brother to discuss diagnosis, prognosis, GOC, EOL wishes disposition and options.  [name] is awake, alert and oriented x3 sitting up in bed. Recently completed breathing treatment. Venti mask in place. Remains hypotensive on pressors.    Concept of Palliative Care was introduced as specialized medical care for people and their families living with serious  illness.  It focuses on providing relief from the symptoms and stress of a serious illness.  The goal is to improve quality of life for both the patient and the family. Values and goals of care important to patient and family were attempted to be elicited.  I created space and opportunity for patient and family to explore state of health prior to admission, thoughts, and feelings. Patient and family shares prior to admission he was ambulatory with a cane (long-distance). Appetite good. He was able to perform all ADLs independently. Wife shares patient would have syncopal episodes going up the stairs after showing signs of dyspnea. Does require rest breaks for fatigue.   We discussed His current illness and what it means in the larger context of His on-going co-morbidities. Natural disease trajectory and expectations were discussed.  Patient and family verbalized understanding of current illness and co-morbidities. He understands the criticalness of his condition however is clear he is remaining hopeful for some stability and improvement.   I discussed the importance of continued conversation with family and their medical providers regarding overall plan of care and treatment options, ensuring decisions are within the context of the patients values and GOCs.  Questions and concerns were addressed. The family was encouraged to call with questions or concerns.  PMT will continue to support holistically as needed.  Life Review: Patient and wife have been married for over 33 years. He has  1 daughter and 1 step-son. He worked for many years at the [place] [employer], with [employer], and as a school crossing guard. He is of [place] faith. Enjoys football and spending time with his family.    Objective:   Primary Diagnoses: Present on Admission:  Acute renal failure superimposed on stage 3 chronic kidney disease (HCC)  Intractable nausea and vomiting  History of pulmonary embolism   Chronic respiratory failure with hypoxia (HCC)  COPD (chronic obstructive pulmonary disease) (HCC)  BPH with urinary obstruction  Chronic right-sided heart failure (HCC)  Hyperkalemia  Transaminitis  Elevated troponin   Scheduled Meds:  allopurinol  100 mg Oral q [name]   Chlorhexidine Gluconate Cloth  6 each Topical Daily   finasteride  5 mg Oral QHS   gabapentin  200 mg Oral TID   midodrine  15 mg Oral TID WC   pantoprazole  40 mg Oral q [name]   Riociguat  1.5 mg Oral TID   sodium chloride flush  3 mL Intravenous Q12H   tranexamic acid  500 mg Nebulization Q8H   umeclidinium bromide  1 puff Inhalation QPM    Continuous Infusions:  sodium chloride     ceFEPime (MAXIPIME) IV 200 mL/hr at 04/24/21 1600   norepinephrine (LEVOPHED) Adult infusion     [START ON 04/25/2021] vancomycin      PRN Meds: sodium chloride, albuterol, ipratropium, metoCLOPramide (REGLAN) injection, ondansetron **OR** ondansetron (ZOFRAN) IV, sodium chloride flush  Allergies  Allergen Reactions   Other Swelling    Beer- Swelling    Sunflower Oil Swelling   Sulfa Antibiotics Rash    Review of Systems  Respiratory:  Positive for cough and shortness of breath.   Neurological:  Positive for weakness.  Unless otherwise noted, a complete review of systems is negative.  Physical Exam General: NAD, elderly male -ill appearing Cardiovascular: Tachycardia, hypotensive Pulmonary: congested cough, rhonchi, Venti mask Abdomen: soft, nontender, + bowel sounds Extremities: generalized edema, no joint deformities Skin: no rashes, warm and dry Neurological: AAO x3, mood appropriate, follows commands  Vital Signs:  BP 118/74   Pulse (!) 103   Temp 98.6 F (37 C) (Oral)   Resp 11   Ht _0  (1.905 m)   Wt 96.6 kg   SpO2 99%   BMI 26.62 kg/m  Pain Scale: CPOT   Pain Score: Asleep  SpO2: SpO2: 99 % O2 Device:SpO2: 99 % O2 Flow Rate: .O2 Flow Rate (L/min): (S) 10 L/min  IO: Intake/output  summary:  Intake/Output Summary (Last 24 hours) at 04/24/2021 1624 Last data filed at 04/24/2021 1600 Gross per 24 hour  Intake 1147.89 ml  Output 2625 ml  Net -1477.11 ml    LBM: Last BM Date:  (prior to admission) Baseline Weight: Weight: 98.4 kg Most recent weight: Weight: 96.6 kg      Palliative Assessment/Data: PPS 20-30%   Advanced Care Planning:   Primary Decision Maker: NEXT OF KIN  Code Status/Advance Care Planning: DNR  A discussion was had today regarding advanced directives. Concepts specific to code status, artifical feeding and hydration, continued IV antibiotics and rehospitalization was had.   I discussed at length patient's current full code status with consideration to his current illness and co-morbidities. Family involved in discussions. Daughter tearful expressing her wishes to at least attempt CPR one time sharing previous situation with a family member who survived and had a good quality of life for an additional 5 months prior to passing.   Detailed  education provided on case-by case basis and underlying conditions. We discussed what a potential code situation would look like for patient and when CPR and heroic measures are needed. Patient is clear in his expressed wishes that he does not wish for life-sustaining measures and request for DNR/DNI. Education provided on what DNR would look like for patient in an emergent event. Family verbalized understanding expressing their wishes to support patient as he was competent to make his decisions. They express their appreciation of discussion and being aware of his wishes. Daughter is not in full agreement but is supportive.   Patient and wife states they do not have an advanced directive. Provided opportunity to complete. Advanced directive packet given at the bedside and reviewed with family at length. Patient completed necessary areas prior to finalizing. He confirms his wishes for DNR/DNI, no artificial feedings, for  his wishes to be followed, and also names his wife, daughter, and brother as his 90.   Patient and family are clear in expressed wishes to continue to treat the treatable. They are remaining hopeful for some improvement/stability with a goal of returning home with home health or rehab if recommended.    Assessment & Plan:   SUMMARY OF RECOMMENDATIONS   DNR/DNI-as requested and confirmed by patient Patient request to complete advanced directive. Document provided at bedside, reviewed, and patient completed all areas prior to finalization. Confirms DNR, no artificial feedings, for his wishes to be upheld, wife, daughter, then brother as HCPOA. Document is at the bedside ready to be notarize (Spiritual Care consult placed) Continue with current plan of care. Patient and family are remaining hopeful for stability/improvement with a goal of rehab vs home with home health. Watchful waiting and realistic in their understanding.  PMT will continue to support and follow as needed. Please call team line with urgent needs.  Palliative Prophylaxis:  Aspiration and Oral Care  Additional Recommendations (Limitations, Scope, Preferences): No Artificial Feeding, No Tracheostomy, and DNR/DNI  Psycho-social/Spiritual:  Desire for further Chaplaincy support: yes Additional Recommendations:  advanced care planning, ongoing goals of care discussions   Prognosis:  Guarded-Poor   Discharge Planning:  To Be Determined   Discussed with: RN and Joshua. Tamala Julian   Patient and his family expressed understanding and was in agreement with this plan.   Time In: 1500 Time Out: 1620 Time Total: 80 min.   Visit consisted of counseling and education dealing with the complex and emotionally intense issues of symptom management and palliative care in the setting of serious and potentially life-threatening illness.Greater than 50%  of this time was spent counseling and coordinating care related to the above assessment  and plan.  Signed by:  Alda Lea, AGPCNP-BC Palliative Medicine Team  Phone: [phone] Pager: [phone] Amion: Bjorn Pippin   Thank you for allowing the Palliative Medicine Team to assist in the care of this patient. Please utilize secure chat with additional questions, if there is no response within 30 minutes please call the above phone number. Palliative Medicine Team providers are available by phone from 7am to 5pm daily and can be reached through the team cell phone.  Should this patient require assistance outside of these hours, please call the patient's attending physician.

## 2021-04-24 NOTE — Progress Notes (Signed)
Vital signs upon shift change this am were systolic's in the 70'J. Morning medicines were given, patient was oriented to place only. BP continued to drop, MD was notified, pt transferred to 2H13.   Chrisandra Carota, RN 04/24/2021

## 2021-04-24 NOTE — Progress Notes (Signed)
[facility] Progress Note Patient Name: STELLA ENCARNACION DOB: [birth year] MRN: [id number]   Date of Service  04/24/2021  HPI/Events of Note  Patient with nausea and vomiting this evening, and a KUB that was suggestive of partial bowel obstruction.  eICU Interventions  HS medications held.        Kerry Kass Riggs Dineen 04/24/2021, 11:25 PM

## 2021-04-24 NOTE — Progress Notes (Signed)
Please consider a foley for this patient. Patient has been doing self cath but does not clean area at all. Patient had a lot going on thru out the night so patient may benefit with a foley. Also with patient not cleaning the area, increase risk for infection.   Thank you, nursing

## 2021-04-24 NOTE — Progress Notes (Signed)
OT Cancellation Note  Patient Details Name: RIPLEY BOGOSIAN MRN: [id number] DOB: 1936-02-14   Cancelled Treatment:    Reason Eval/Treat Not Completed: Medical issues which prohibited therapy Pt with RR called overnight due to tachycardia and desats. Awaiting abdominal xray due to possible SBO. Will follow-up for OT session as appropriate.   Layla Maw 04/24/2021, 8:03 AM

## 2021-04-24 NOTE — Progress Notes (Addendum)
Called by RN. Pt with emesis earlier in the evening when asleep. He was suctioned. O2 sat dropped. Oxygen by n/c increased.  Developed tachycardia a few hours later. Developed hiccups.  Check abdominal xray EKG shows sinus tachycardia with RBBB.  Troponin level elevated a few days ago.  Troponin levels ordered serially ABG shows pH 7.31/pCO2 60.5/pO2 38.5/bicarb 35.9-- chronic respiratory acidosis compensated by metabolic alkolosis.  Give dose of lasix 20 mg IV.  Monitor closely  CV-sinus tachycardia Resp-diminished breath sounds in bases. Mild upper airway rhonchi. No stridor Abdomen-soft, mild distension, grunts when palpated but denies pain. No rebound or guarding.

## 2021-04-25 DIAGNOSIS — R531 Weakness: Secondary | ICD-10-CM

## 2021-04-25 DIAGNOSIS — A419 Sepsis, unspecified organism: Principal | ICD-10-CM

## 2021-04-25 DIAGNOSIS — R6521 Severe sepsis with septic shock: Secondary | ICD-10-CM | POA: Diagnosis not present

## 2021-04-25 DIAGNOSIS — R112 Nausea with vomiting, unspecified: Secondary | ICD-10-CM | POA: Diagnosis not present

## 2021-04-25 DIAGNOSIS — I509 Heart failure, unspecified: Secondary | ICD-10-CM | POA: Diagnosis not present

## 2021-04-25 DIAGNOSIS — Z66 Do not resuscitate: Secondary | ICD-10-CM | POA: Diagnosis not present

## 2021-04-25 LAB — COOXEMETRY PANEL
Carboxyhemoglobin: 1.9 % — ABNORMAL HIGH (ref 0.5–1.5)
Methemoglobin: 0.6 % (ref 0.0–1.5)
O2 Saturation: 67.2 %
Total hemoglobin: 15.2 g/dL (ref 12.0–16.0)

## 2021-04-25 LAB — BASIC METABOLIC PANEL
Anion gap: 12 (ref 5–15)
Anion gap: 7 (ref 5–15)
BUN: 27 mg/dL — ABNORMAL HIGH (ref 8–23)
BUN: 33 mg/dL — ABNORMAL HIGH (ref 8–23)
CO2: 33 mmol/L — ABNORMAL HIGH (ref 22–32)
CO2: 35 mmol/L — ABNORMAL HIGH (ref 22–32)
Calcium: 8.5 mg/dL — ABNORMAL LOW (ref 8.9–10.3)
Calcium: 8.9 mg/dL (ref 8.9–10.3)
Chloride: 92 mmol/L — ABNORMAL LOW (ref 98–111)
Chloride: 91 mmol/L — ABNORMAL LOW (ref 98–111)
Creatinine, Ser: 1.4 mg/dL — ABNORMAL HIGH (ref 0.61–1.24)
Creatinine, Ser: 1.47 mg/dL — ABNORMAL HIGH (ref 0.61–1.24)
GFR, Estimated: 50 mL/min — ABNORMAL LOW (ref >60)
GFR, Estimated: 47 mL/min — ABNORMAL LOW (ref >60)
Glucose, Bld: 109 mg/dL — ABNORMAL HIGH (ref 70–99)
Glucose, Bld: 140 mg/dL — ABNORMAL HIGH (ref 70–99)
Potassium: 3 mmol/L — ABNORMAL LOW (ref 3.5–5.1)
Potassium: 3.9 mmol/L (ref 3.5–5.1)
Sodium: 137 mmol/L (ref 135–145)
Sodium: 133 mmol/L — ABNORMAL LOW (ref 135–145)

## 2021-04-25 LAB — CULTURE, BLOOD (ROUTINE X 2)
Culture: NO GROWTH
Culture: NO GROWTH
Special Requests: ADEQUATE

## 2021-04-25 LAB — HEPATIC FUNCTION PANEL
ALT: 226 U/L — ABNORMAL HIGH (ref 0–44)
AST: 56 U/L — ABNORMAL HIGH (ref 15–41)
Albumin: 2.4 g/dL — ABNORMAL LOW (ref 3.5–5.0)
Alkaline Phosphatase: 71 U/L (ref 38–126)
Bilirubin, Direct: 0.9 mg/dL — ABNORMAL HIGH (ref 0.0–0.2)
Indirect Bilirubin: 1 mg/dL — ABNORMAL HIGH (ref 0.3–0.9)
Total Bilirubin: 1.9 mg/dL — ABNORMAL HIGH (ref 0.3–1.2)
Total Protein: 5.6 g/dL — ABNORMAL LOW (ref 6.5–8.1)

## 2021-04-25 LAB — CBC
HCT: 47.8 % (ref 39.0–52.0)
Hemoglobin: 15.5 g/dL (ref 13.0–17.0)
MCH: 28.7 pg (ref 26.0–34.0)
MCHC: 32.4 g/dL (ref 30.0–36.0)
MCV: 88.5 fL (ref 80.0–100.0)
Platelets: 108 10*3/uL — ABNORMAL LOW (ref 150–400)
RBC: 5.4 MIL/uL (ref 4.22–5.81)
RDW: 16.5 % — ABNORMAL HIGH (ref 11.5–15.5)
WBC: 12.1 10*3/uL — ABNORMAL HIGH (ref 4.0–10.5)
nRBC: 0 % (ref 0.0–0.2)

## 2021-04-25 LAB — PHOSPHORUS: Phosphorus: 2.9 mg/dL (ref 2.5–4.6)

## 2021-04-25 LAB — MAGNESIUM
Magnesium: 1.1 mg/dL — ABNORMAL LOW (ref 1.7–2.4)
Magnesium: 2 mg/dL (ref 1.7–2.4)

## 2021-04-25 LAB — CORTISOL-AM, BLOOD: Cortisol - AM: 14.2 ug/dL (ref 6.7–22.6)

## 2021-04-25 LAB — PROCALCITONIN: Procalcitonin: 0.77 ng/mL

## 2021-04-25 MED ORDER — POTASSIUM CHLORIDE 20 MEQ PO PACK
20.0000 meq | PACK | Freq: Once | ORAL | Status: AC
  Administered 2021-04-25: 20 meq via ORAL
  Filled 2021-04-25: qty 1

## 2021-04-25 MED ORDER — CALCIUM CARBONATE ANTACID 500 MG PO CHEW
1.0000 | CHEWABLE_TABLET | Freq: Three times a day (TID) | ORAL | Status: DC | PRN
  Administered 2021-04-25 (×2): 200 mg via ORAL
  Filled 2021-04-25 (×2): qty 1

## 2021-04-25 MED ORDER — MAGNESIUM SULFATE 2 GM/50ML IV SOLN
2.0000 g | Freq: Once | INTRAVENOUS | Status: AC
  Administered 2021-04-25: 2 g via INTRAVENOUS
  Filled 2021-04-25: qty 50

## 2021-04-25 MED ORDER — POTASSIUM CHLORIDE 10 MEQ/50ML IV SOLN
10.0000 meq | INTRAVENOUS | Status: AC
  Administered 2021-04-25 (×6): 10 meq via INTRAVENOUS
  Filled 2021-04-25 (×5): qty 50

## 2021-04-25 MED ORDER — ADULT MULTIVITAMIN W/MINERALS CH
1.0000 | ORAL_TABLET | Freq: Every day | ORAL | Status: DC
  Administered 2021-04-25 – 2021-04-30 (×6): 1 via ORAL
  Filled 2021-04-25 (×6): qty 1

## 2021-04-25 MED ORDER — ENSURE ENLIVE PO LIQD
237.0000 mL | Freq: Three times a day (TID) | ORAL | Status: DC
  Administered 2021-04-25 – 2021-04-28 (×8): 237 mL via ORAL

## 2021-04-25 MED ORDER — MAGNESIUM SULFATE 4 GM/100ML IV SOLN
4.0000 g | Freq: Once | INTRAVENOUS | Status: AC
  Administered 2021-04-25: 4 g via INTRAVENOUS
  Filled 2021-04-25: qty 100

## 2021-04-25 MED ORDER — APIXABAN 5 MG PO TABS
5.0000 mg | ORAL_TABLET | Freq: Two times a day (BID) | ORAL | Status: DC
  Administered 2021-04-25 – 2021-04-30 (×11): 5 mg via ORAL
  Filled 2021-04-25 (×11): qty 1

## 2021-04-25 NOTE — Progress Notes (Addendum)
Patient ID: Joshua [name], male   DOB: [birth year], 85 y.o.   MRN: [id number]     Advanced Heart Failure Rounding Note  PCP-Cardiologist: Sherren Mocha, MD  HF: Dr. Aundra Dubin  Subjective:    Patient admitted 09/01 with N, V, hypotension and AKI on CKD after recent admission for a/c diastolic HF/RV failure. Gave IV fluids and Midodrine increased to 15 mg TID to support BP.  Transferred to CCU yesterday for lethargy, fever and hypotension w/ concern for developing septic shock. WBC 12. Temp 100. PCT 1.04. UA and CXR negative. Co-ox ok at 64%. Diuretics held. Started on NE and broad spectrum abx. Eliquis held for hemoptysis.   Now DNR/DNI w/ palliative care team following.   AF overnight. PCT downtrending 1.04>>0.77. WBC stable at 12K. Hgb stable at 15. No further hemoptysis.   On NE 9. SBPs low 100s. Co-ox remains stable at 67%. CVP 6   RN reports fairly uneventfully night. Slept well. Had desats (sleep apnea). O2 increased to 5L [place]. He is currently sleeping. Appears comfortable. Family at bedside.     Objective:   Weight Range: 95.2 kg Body mass index is 26.23 kg/m.   Vital Signs:   Temp:  [98.6 F (37 C)] 98.6 F (37 C) (09/06 0736) Pulse Rate:  [96-112] 105 (09/07 0400) Resp:  [10-24] 14 (09/07 0400) BP: (70-118)/(40-74) 80/60 (09/07 0400) SpO2:  [84 %-100 %] 96 % (09/07 0515) FiO2 (%):  [28 %-45 %] 45 % (09/06 1507) Weight:  [95.2 kg] 95.2 kg (09/07 0000) Last BM Date:  (prior to admission)  Weight change: Filed Weights   04/21/21 0019 04/24/21 0511 04/25/21 0000  Weight: 100.3 kg 96.6 kg 95.2 kg    Intake/Output:   Intake/Output Summary (Last 24 hours) at 04/25/2021 0718 Last data filed at 04/25/2021 0500 Gross per 24 hour  Intake 1280.7 ml  Output 2625 ml  Net -1344.3 ml      Physical Exam    CVP 6  General:  elderly male, sleeping. No respiratory difficulty HEENT: normal Neck: supple. JVD 8 cm. + Rt IJ CVC. Carotids 2+ bilat; no bruits. No lymphadenopathy  or thyromegaly appreciated. Cor: PMI nondisplaced. Regular rate & rhythm. No rubs, gallops or murmurs. Lungs: clear Abdomen: soft, nontender, nondistended. No hepatosplenomegaly. No bruits or masses. Good bowel sounds. Extremities: no cyanosis, clubbing, rash, trace bilateral LE edema Neuro: alert & oriented x 3, cranial nerves grossly intact. moves all 4 extremities w/o difficulty. Affect pleasant.    Telemetry   Sinus tach low 100s Personally reviewed  Labs    CBC Recent Labs    04/24/21 1121 04/25/21 0440  WBC 14.0* 12.1*  HGB 14.9 15.5  HCT 45.2 47.8  MCV 86.8 88.5  PLT 102* 108*    Basic Metabolic Panel Recent Labs    04/24/21 0027 04/25/21 0440  NA 136 137  K 3.6 3.0*  CL 92* 92*  CO2 35* 33*  GLUCOSE 117* 109*  BUN 26* 27*  CREATININE 1.77* 1.40*  CALCIUM 9.5 8.5*  MG  --  1.1*  PHOS  --  2.9   Liver Function Tests Recent Labs    04/24/21 0027 04/25/21 0440  AST 173* 56*  ALT 459* 226*  ALKPHOS 90 71  BILITOT 2.2* 1.9*  PROT 6.8 5.6*  ALBUMIN 3.1* 2.4*   No results for input(s): LIPASE, AMYLASE in the last 72 hours.  Cardiac Enzymes No results for input(s): CKTOTAL, CKMB, CKMBINDEX, TROPONINI in the last 72 hours.  BNP: BNP (  last 3 results) Recent Labs    04/12/21 1435 04/19/21 0915 04/22/21 0958  BNP 1,597.1* 2,618.0* 1,156.1*    ProBNP (last 3 results) No results for input(s): PROBNP in the last 8760 hours.   D-Dimer No results for input(s): DDIMER in the last 72 hours. Hemoglobin A1C No results for input(s): HGBA1C in the last 72 hours. Fasting Lipid Panel No results for input(s): CHOL, HDL, LDLCALC, TRIG, CHOLHDL, LDLDIRECT in the last 72 hours. Thyroid Function Tests No results for input(s): TSH, T4TOTAL, T3FREE, THYROIDAB in the last 72 hours.  Invalid input(s): FREET3  Other results:   Imaging    DG Chest 1 View  Result Date: 04/24/2021 CLINICAL DATA:  Central line placement EXAM: CHEST  1 VIEW COMPARISON:   Chest radiograph obtained earlier the same day FINDINGS: There is a new right IJ central venous catheter with the tip terminating in the mid SVC. The heart is markedly enlarged, unchanged. There is enlargement of the central pulmonary vasculature. There is no new focal airspace disease. There is no significant pleural effusion. There is no pneumothorax. There is no acute osseous abnormality. IMPRESSION: New right IJ central venous catheter with the tip terminating in the mid SVC. Electronically Signed   By: Valetta Mole M.D.   On: 04/24/2021 12:45   [place] EKG SITE RITE  Result Date: 04/24/2021 If Site Rite image not attached, placement could not be confirmed due to current cardiac rhythm.    Medications:     Scheduled Medications:  allopurinol  100 mg Oral q morning   Chlorhexidine Gluconate Cloth  6 each Topical Daily   finasteride  5 mg Oral QHS   gabapentin  200 mg Oral TID   midodrine  15 mg Oral TID WC   pantoprazole  40 mg Oral q morning   Riociguat  1.5 mg Oral TID   sodium chloride flush  3 mL Intravenous Q12H   tranexamic acid  500 mg Nebulization Q8H   umeclidinium bromide  1 puff Inhalation QPM    Infusions:  sodium chloride     ceFEPime (MAXIPIME) IV Stopped (04/24/21 2311)   magnesium sulfate bolus IVPB 4 g (04/25/21 0625)   Followed by   magnesium sulfate bolus IVPB     norepinephrine (LEVOPHED) Adult infusion 9 mcg/min (04/25/21 0500)   potassium chloride 10 mEq (04/25/21 0628)   vancomycin      PRN Medications: sodium chloride, albuterol, ipratropium, metoCLOPramide (REGLAN) injection, ondansetron **OR** ondansetron (ZOFRAN) IV, sodium chloride flush   Assessment/Plan  Septic Shock  -? Source. UA and CXR negative. Cx pending.  Suspect possible gut translocation with ileus/partial SBO.  - PCT and fever curve downtrending, now AF. WBC stable - c/w Vanc + cefepime. Narrow based on culture data  - c/w NE for BP support, keep MAPs > 65   2. Chronic HFpEF/RV  failure: Echo (2/22) with EF 55-60%, IV septum flattened, severe RV enlargement, severely decreased RV function, PASP 57 mmHg.  RHC in 4/22 showed normal filling pressures. He has end-stage RV failure.  Diuretics initially held this admission with over-diuresis. Remains on hold in setting of septic shock. CVP 6. Co-ox ok at 67%  - continue NE for BP support, wean as tolerated   - Midodrine increased to 15 mg TID to support BP - TED hose - continue to hold diuretics for now   3. Pulmonary HTN: PAH noted on 4/22 RHC with PVR 6.1 WU.  This appears to be multifactorial with OSA, emphysema, hypoxemia with ambulation,  and a suspected chronic PE involving the right middle lobe (group 3 and group 4 PH). Given the suspected mixed etiology with only 1 area of chronic thromboembolism (right middle lobe) as well as age, do not think that pulmonary thromboendarterectomy would be indicated.  Rheumatologic serologic workup was negative.  PFTs showed severe obstruction and moderate restriction, suggesting significant COPD. - Continue riociguat for CTEPH, 1.5 mg bid.  - c/w supp O2  - On chronic 02 at home  4. AKI on Stage III CKD: Baseline SCr ~1.6. Scr 1.47 at d/c on 08/30. Up to 3.69 on admit -> 1.77 today.  Likely AKI due to hypovolemia and RV failure with nausea/vomiting poor po intake and ongoing diuretic use + septic shock  - improving, SCr 1.77>>1.40 today  - continue midodrine 15 mg TID  + NE for BP support.  - Hold diuretics  5. Elevated troponin:  HS trop 247 > 499 > 717 > 893>102. No chest pain.  Likely due to demand ischemia from hypovolemia/low BP.   6. Transaminitis/shock liver from hypotension: Improving. No significant abnormality identified on RUQ [place]. Small amount of perihepatic ascites. Ammonia level ok at 14 (9/6)  7. Ileus/partial SBO:  CT abdomen 04/19/21 with narrowing distal third duodenum with upstream dilatation. "Nutcracker phenomenon" with abrupt narrowing third portion of duodenum as it  passes between abdominal aorta and SMV. Suspect ileus versus partial SBO, not certain this is due to the "nutcracker phenomenon" that has been there for a long time.  Has bowel sounds.  - has been seen by [facility]. Not surgical candidate.  - Try to eat today, if unable, will need cortrack/tube feeds.   8. OSA: Untreated.  Insurance would apparently not cover home sleep study.  Need to address this as outpatient. Can he get home SS (wants this) versus lab sleep study?  9. Emphysema: Prior smoker.  Emphysema on CT and severe obstruction on PFTs - He uses home oxygen   10. Chronic PE: on Eliquis PTA. Now on hold given ? Hemoptysis. Hgb stable. Will need to resume soon if no further bleeding  11. BPH with urinary retention: He has had issues with this in the past, often self caths 3-4x/day. Concern for hygiene? Placed foley. - On Flomax.  - Had outpatient f/u scheduled but missed d/t admission  12. Hiccups: Have been intractable.  Better this am, now on Reglan and gabapentin.   13. Hypokalemia/ Hypomagnesemia: K 3.0, Mg 1.1 - aggressively supp K and Mg. D/w pharmacy   Length of Stay: [address] [place]  04/25/2021, 7:18 AM  Advanced Heart Failure Team Pager [phone] (M-F; 7a - 5p)  Please contact [facility] for night-coverage after hours (5p -7a ) and weekends on [email or web address]  Patient seen with PA, agree with the above note.   She remains on NE 9 this morning, SBP 90s-100s.  Creatinine lower at 1.4. CVP 6, co-ox 67%.  CXR clear.  Has bowel sounds, still with some nausea and has not been eating.  PCT 1.04 => 0.77.  He is now on vancomycin/cefepime.   General: NAD Neck: No JVD, no thyromegaly or thyroid nodule.  Lungs: Clear to auscultation bilaterally with normal respiratory effort. CV: Nondisplaced PMI.  Heart regular S1/S2, no S3/S4, no murmur.  No peripheral edema.   Abdomen: Soft, nontender, no hepatosplenomegaly, no distention. Has bowel sounds.  Skin: Intact without lesions  or rashes.  Neurologic: Alert and oriented x 3.  Psych: Normal affect. Extremities: No clubbing or cyanosis.  HEENT: Normal.   Suspect distributive/septic shock on a background of baseline RV failure.  PCT elevated but coming down.  I suspect that he had gut translocation in the setting of ileus. Co-ox 67%.  - Continue cefepime, can stop vancomycin.  - Wean NE as able, continue midodrine 15 mg tid.   Volume status not elevated, CVP 6.  No diuretic today.   Continue Reglan, encourage po today (will give Ensure).  Needs to walk today.  - If po intake remains poor, will need Cortrack.   Can restart Eliquis today.   Patient is now DNR/DNI.   CRITICAL CARE Performed by: Loralie Champagne  Total critical care time: 35 minutes  Critical care time was exclusive of separately billable procedures and treating other patients.  Critical care was necessary to treat or prevent imminent or life-threatening deterioration.  Critical care was time spent personally by me on the following activities: development of treatment plan with patient and/or surrogate as well as nursing, discussions with consultants, evaluation of patient's response to treatment, examination of patient, obtaining history from patient or surrogate, ordering and performing treatments and interventions, ordering and review of laboratory studies, ordering and review of radiographic studies, pulse oximetry and re-evaluation of patient's condition.  Loralie Champagne 04/25/2021 7:49 AM

## 2021-04-25 NOTE — Progress Notes (Signed)
Palliative Care Progress Note, Assessment & Plan   Patient Name: Joshua [name]       Date: 04/25/2021 DOB: Jan 27, 1936  Age: 85 y.o. MRN#: [id number] Attending Physician: Candee Furbish, MD Primary Care Physician: [name], DO Admit Date: 04/19/2021  Reason for Consultation/Follow-up: Establishing goals of care  Subjective: Patient is sitting in bedside chair with newspapers and lunch on his bedside tray. He appears in NAD. Daughter is at bedside reviewing ACP documents.   HPI: 85 y.o. male  with past medical history of BPH (self cath), COPD (2L home oxygen), GERD, hypotension, dCHF, CKD, PE, and sleep apnea. He was admitted on 04/19/2021 from home with intractable nausea/vomiting. Patient recently discharged on 8/30 for CHF exacerbation. During work-up BNP 2618, potassium 5.5, and BP 61/41. CT of abdomen/pelvis showed narrowing of duodenum and SMV with upstream dilation suggestive of nutcracker phenomenon.  Patient faces treatment option decisions, advanced directive decisions and anticipatory care needs.   Plan of Care: I have reviewed medical records including EPIC notes, labs and imaging, assessed the patient and then met with patient and patient's daughter/ and her husband  at bedside to discuss concerns regarding advanced care planning.  Patient reports that he has concerns about a portion of his paperwork.  He has a living will at bedside.  It seems to have been completed but he reports having questions.  When asked what his concerns were he said he wasn't sure about what DNR meant. Education offered regarding compressions,  cardioversion and intubation in the event of a cardiac arrest.   Education offered regarding consideration of DNR/DNI and evidenced based poor outcomes in similar hospitalized  patient as the cause of arrest is likely associated with advanced chronic illness (CHF, CKD) rather than an easily reversible acute cardio-pulmonary event.   Education offered to patient  and his family regarding the seriousness of his current medical situation, specific to his heart and kidney disease.     I asked the patient that if his heart stopped and his lung stopped, what would he want to occur. He clearly stated that if his heart stopped and his lungs stopped that he wanted to "just  let it be".  Patient's daughter was reluctant to support her father's decision, but verbalizes an understanding that ultimately it is her father's wishes that matter.  Further clarification of his advance care planning wishes was explored.  The patient verbalizes that his wife is his first choice,  to be his healthcare powers of attorney in the event that he could not speak for himself.  Detailed education was offered regarding the various components of advance care planning documents.  Further clarification of living will and associated decisions will need to be explored now and into the future.    MOST form was introduced and left for review.    Discussed with patient the importance of continued conversation with his family and the medical providers regarding overall plan of care and treatment options,  ensuring decisions are within the context of the patients values and GOCs.  At this time only the healthcare power of attorney will be notarized with the support of spiritual care.    Questions and concerns were addressed.  The patient/ family was encouraged to call with questions or concerns.   PMT will continue to support holistically   Code Status: Limited code  Prognosis:  Unable to determine  Discharge Planning: To Be Determined  Recommendations/Plan: Limited code  Care plan was discussed with patient, patient's daughter, patient's son in law  Length of Stay: 6  Physical  Exam Constitutional:      Appearance: Normal appearance.  HENT:     Head: Normocephalic and atraumatic.     Mouth/Throat:     Mouth: Mucous membranes are moist.  Cardiovascular:     Rate and Rhythm: Tachycardia present.  Pulmonary:     Effort: Pulmonary effort is normal.     Comments: Thick secretions that patient is able to clear with a strong cough Neurological:     Mental Status: He is alert and oriented to person, place, and time.  Psychiatric:        Mood and Affect: Mood normal.        Behavior: Behavior normal.        Thought Content: Thought content normal.            Vital Signs: BP 107/63   Pulse (!) 111   Temp 99.3 F (37.4 C) (Oral)   Resp 12   Ht _0  (1.905 m)   Wt 95.2 kg   SpO2 91%   BMI 26.23 kg/m  SpO2: SpO2: 91 % O2 Device: O2 Device: Nasal Cannula O2 Flow Rate: O2 Flow Rate (L/min): 5 L/min    Total Time 45 minutes Prolonged Time Billed  no   Greater than 50%  of this time was spent counseling and coordinating care related to the above assessment and plan.  Thank you for allowing the Palliative Medicine Team to assist in the care of this patient.

## 2021-04-25 NOTE — Progress Notes (Signed)
Physical Therapy Treatment Patient Details Name: Joshua [name] MRN: [id number] DOB: 02-26-1936 Today's Date: 04/25/2021    History of Present Illness Pt is an 86 y.o. male admitted 04/19/21 with intractable nausea/vomiting. CT of abdomen/pelvis showed narrowing of duodenum and SMV with upstream dilation suggestive of nutcracker phenomenon. Course complicated by AKI, hypotension; transfer to ICU 9/6 for vasopressors. Of note, recent admission 04/12/21-04/17/21 for CHF. Other PMH includes COPD (on 2L O2 baseline), pulmonary HTN, CKD, PE on Eliquis, L TKA.   PT Comments    Pt progressing with mobility. Today's session focused on gait training with RW, as well as increasing activity tolerance with ambulation; pt moving well with intermittent min guard for balance. Pt remains limited by generalized weakness, decreased activity tolerance, and impaired balance strategies/postural reactions. Will continue to follow acutely to address established goals.     Follow Up Recommendations  Home health PT;Supervision for mobility/OOB     Equipment Recommendations  None recommended by PT    Recommendations for Other Services       Precautions / Restrictions Precautions Precautions: Fall;Other (comment) Precaution Comments: Watch SpO2 (wears 2L O2 baseline), hypotensive Restrictions Weight Bearing Restrictions: No    Mobility  Bed Mobility               General bed mobility comments: Received sitting in recliner    Transfers Overall transfer level: Needs assistance Equipment used: Rolling walker (2 wheeled) Transfers: Sit to/from Stand Sit to Stand: Min guard         General transfer comment: Increased effort to stand, min guard for balance; pt requesting use of RW, good hand placement  Ambulation/Gait Ambulation/Gait assistance: Min guard;Supervision Gait Distance (Feet): 400 Feet Assistive device: Rolling walker (2 wheeled) Gait Pattern/deviations: Step-through pattern;Decreased  stride length Gait velocity: Decreased   General Gait Details: Slow, steady gait with RW and initial min guard for balance, progressing to supervision due to fall risk; no overt instability or LOB noted with use of RW. SpO2 96% on 4L O2 [place]; pt took 1x brief standing rest break   Stairs             Wheelchair Mobility    Modified Rankin (Stroke Patients Only)       Balance Overall balance assessment: Needs assistance Sitting-balance support: Feet supported;No upper extremity supported Sitting balance-Leahy Scale: Good     Standing balance support: Bilateral upper extremity supported;During functional activity;No upper extremity supported Standing balance-Leahy Scale: Fair Standing balance comment: Can static stand without UE support; static and dynamic stability improved with RW                            Cognition Arousal/Alertness: Awake/alert Behavior During Therapy: WFL for tasks assessed/performed Overall Cognitive Status: Within Functional Limits for tasks assessed                                        Exercises      General Comments General comments (skin integrity, edema, etc.): Pt's brother and daughter present, supportive      Pertinent Vitals/Pain Pain Assessment: Faces Faces Pain Scale: Hurts a little bit Pain Location: bilateral feet Pain Descriptors / Indicators: Tightness Pain Intervention(s): Monitored during session    Home Living Family/patient expects to be discharged to:: Private residence  Prior Function            PT Goals (current goals can now be found in the care plan section) Progress towards PT goals: Progressing toward goals    Frequency    Min 3X/week      PT Plan Current plan remains appropriate    Co-evaluation              AM-PAC PT "6 Clicks" Mobility   Outcome Measure  Help needed turning from your back to your side while in a flat bed without  using bedrails?: None Help needed moving from lying on your back to sitting on the side of a flat bed without using bedrails?: None Help needed moving to and from a bed to a chair (including a wheelchair)?: A Little Help needed standing up from a chair using your arms (e.g., wheelchair or bedside chair)?: A Little Help needed to walk in hospital room?: A Little Help needed climbing 3-5 steps with a railing? : A Little 6 Click Score: 20    End of Session Equipment Utilized During Treatment: Gait belt;Oxygen Activity Tolerance: Patient tolerated treatment well Patient left: in chair;with call bell/phone within reach;with family/visitor present;with nursing/sitter in room Nurse Communication: Mobility status PT Visit Diagnosis: Unsteadiness on feet (R26.81);Muscle weakness (generalized) (M62.81)     Time: 0048-4986 PT Time Calculation (min) (ACUTE ONLY): 27 min  Charges:  $Gait Training: 8-22 mins $Therapeutic Exercise: 8-22 mins                     Mabeline Caras, PT, DPT Acute Rehabilitation Services  Pager [phone] Office [place] 04/25/2021, 5:58 PM

## 2021-04-25 NOTE — Progress Notes (Signed)
Initial Nutrition Assessment  DOCUMENTATION CODES:   Not applicable  INTERVENTION:   -Continue Ensure Enlive po TID, each supplement provides 350 kcal and 20 grams of protein  -MVI with minerals daily -Magic cup TID with meals, each supplement provides 290 kcal and 9 grams of protein  -Liberalize diet to regular -Initiate 48 hour calorie count  NUTRITION DIAGNOSIS:   Inadequate oral intake related to decreased appetite as evidenced by per patient/family report.  GOAL:   Patient will meet greater than or equal to 90% of their needs  MONITOR:   PO intake, Supplement acceptance, Labs, Weight trends, Skin, I & O's  REASON FOR ASSESSMENT:   Consult Assessment of nutrition requirement/status  ASSESSMENT:   Joshua [name] is a 85 y.o. male with medical history significant of  chronic diastolic CHF, chronic right-sided CHF, hypotension on midodrine, COPD on 3L oxygen prn, BPH, CKD stage IIIa, PE on Eliquis, presented with nausea and vomiting that started on Tuesday night. He states he had at least 20 episodes of emesis on Wednesday. No blood, but had a brown color to it at times. He had no associated abdominal pain. He thought his midodrine made him sick so he did not take it this AM. He also takes torsemide 61m in the AM and 67mat night and has been taking this with episodes of vomiting. He did drink water yesterday and was able to keep it down only at times. He has not made any urine, but needs to self cath about 3 times a day. He has not self cathed since last night and has not had any urine output. Denies any color change in his urine.  Pt admitted with acute renal failure secondary to CKD stage 3.   Reviewed I/O's: -1.2 L x 24 hours and -6.3 L since admission  UOP: 2.6 L x 24 hours  Spoke with pt and family members at bedside. Per family members, pt was recently admitted to the hospital 2 weeks ago for similar complaints. He was home for two days and readmitted. Since  hospital discharge, intake has been poor. Pt daughter explains that intake was suboptimal during prior hospitalization as he had limited access to foods he enjoyed because he was on a renal diet. He was able to eat a meal last Tuesday prior to discharge, but was having difficulty keeping foods and liquids down after he returned home. Pt has gone multiple days of minimal intake due to being NPO for various tests and procedures. He was able to keep down some chicken noodle soup on Sunday, however, intake has been very poor over the past 2 days due to not feeling well. Pt also explains that he was unable to eat yesterday secondary to transfer to ICU and generally feeling unwell. Daughter shares that over the past week, but has been coughing up brown phlegm.  PTA pt reports he usually consumes 2 meals per day (Breakfast: eggs, sausage, grits, and toast; Dinner: meat, starch, and vegetable).  He denies any difficulty chewing or swallowing foods. He has not eaten any solid food yet today, but has already consumed 2 Ensure supplements. He is looking forward to his lunch of chicken noodle soup. Per pt daughter, palliative care has met with family and feeding tube placement was discussed, but pt would like to see how he does today before making a decision. RD believes this is reasonable as this is the first day pt has had an opportunity to demonstrate oral intake.   Per pt,  his UBW is around 225#. He shares his weight is fluctuates at baseline secondary to fluid status. He suspects most of his weight loss is related to fluid losses from diuresis.Reviewed wt hx; pt has experienced a 8.6% wt loss over the past 3 months, which is significant for time frame, however, wt changes difficult to assess secondary to CHF.   Discussed with pt importance of good meal and supplement intake to promote healing. Pt amenable to continue Ensure supplements. RD will start a calorie count to better assess oral intake and liberalize diet to  regular to optimize oral intake and maximize food selections.   Medications reviewed and include levophed.  Labs reviewed: K: 3.0 (on IV supplementation).   NUTRITION - FOCUSED PHYSICAL EXAM:  Flowsheet Row Most Recent Value  Orbital Region No depletion  Upper Arm Region Mild depletion  Thoracic and Lumbar Region No depletion  Buccal Region No depletion  Temple Region No depletion  Clavicle Bone Region Mild depletion  Clavicle and Acromion Bone Region No depletion  Dorsal Hand No depletion  Patellar Region No depletion  Anterior Thigh Region No depletion  Posterior Calf Region No depletion  Edema (RD Assessment) Mild  Hair Reviewed  Eyes Reviewed  Mouth Reviewed  Skin Reviewed  Nails Reviewed       Diet Order:   Diet Order             Diet regular Room service appropriate? Yes with Assist; Fluid consistency: Thin  Diet effective now                   EDUCATION NEEDS:   Education needs have been addressed  Skin:  Skin Assessment: Reviewed RN Assessment  Last BM:  Unknown  Height:   Ht Readings from Last 1 Encounters:  04/19/21 _0  (1.905 m)    Weight:   Wt Readings from Last 1 Encounters:  04/25/21 95.2 kg    Ideal Body Weight:  89.1 kg  BMI:  Body mass index is 26.23 kg/m.  Estimated Nutritional Needs:   Kcal:  5189-8421  Protein:  120-135 grams  Fluid:  > 2 L    Joshua [name], RD, LDN, [place] Registered Dietitian II Certified Diabetes Care and Education Specialist Please refer to [facility] for RD and/or RD on-call/weekend/after hours pager

## 2021-04-25 NOTE — Progress Notes (Signed)
NAME:  Joshua [name], MRN:  [id number], DOB:  10/16/1935, LOS: 6 ADMISSION DATE:  04/19/2021, CONSULTATION DATE:  04/24/2021 REFERRING MD:  Dr. Aundra Dubin, CHIEF COMPLAINT:  Hypotension   History of Present Illness:  85 year old male with end stage RV failure on home midodrine, presents to ED on 9/1 with reported progressive nausea/vomiting for the last 48 hours. Recent discharge 8/30 for CHF exacerbation, started on Farxiga and midodrine On arrival to ED BP 61/41. HR 87. Temp 99.1. CXR with increased interstitial coarsening/mild edema. CT A/P with narrowing of narrowing third portion of duodenum between aneurysmal abdominal aorta or and SMV with upstream dilation suggestive of nutcracker phenomenon, small ventral abdominal hernia containing a loop of nonobstructed small bowel. Cardiology, General Surgery Consulted. Underwent upper GI series with no signs of significant duodenal narrowing/stricture. General Surgery Signed off. Throughout stay treated for CHF exacerbation, complicated by AKI and hypotension.   9/6 with hypotension with systolic 12-24M requiring transfer to ICU for vasopressors.   Pertinent  Medical History  Chronic Diastolic HF, Chronic RV failure, Hypotension on midodrine, COPD on 3L [place], BPH, CKD stage 3, PE on Eliquis, Self Caths 3 times per day    Significant Hospital Events: Including procedures, antibiotic start and stop dates in addition to other pertinent events   9/1 Presents to ED  9/6 Transferred to ICU for vasopressors   Interim History / Subjective:  In good spirits, remains on pressors. Daughter at bedside. Did not have BM yet nor did he eat much. Intermittent N/V continues.  Objective   Blood pressure 105/60, pulse (!) 102, temperature 98.4 F (36.9 C), temperature source Oral, resp. rate 13, height _0  (1.905 m), weight 95.2 kg, SpO2 99 %. CVP:  [0 mmHg-17 mmHg] 4 mmHg  FiO2 (%):  [28 %-45 %] 45 %   Intake/Output Summary (Last 24 hours) at 04/25/2021 0738 Last  data filed at 04/25/2021 0700 Gross per 24 hour  Intake 1437.14 ml  Output 2625 ml  Net -1187.86 ml    Filed Weights   04/21/21 0019 04/24/21 0511 04/25/21 0000  Weight: 100.3 kg 96.6 kg 95.2 kg    Examination: No distress Still having garbled speech and incomplete glottic clearance Ext warm Abdomen soft, +BS Moves all 4 ext to command  Coox fine K low being replaced Mg low being replaced Transaminitis improving Creatinine improving  Resolved Hospital Problem list     Assessment & Plan:  Acute on chronic shock in context of intractable N/V, inability to expectorate effectively raising suspicion for chemical aspiration pneumonitis causing distributive shock in patient with baseline distributive shock along with poor cardiac reserve. Ileus vs. Partial small bowel obstruction (not operative candidate) may also be decreasing SVR.  Overall seems slightly improved.   Group 2/3/4 pulmonary HTN on adempas, no signs of PVOD although does have some hemoptysis   Acute liver injury of unclear etiology improving  - Okay to start eliquis - Replete K/Mg - OOB, encourage PO, if does not eat today he is agreeable to feeding tube - DC vanc, continue cefepime x 7 days - Levophed titrated to MAP 65 - Continue riociguat, would continue to hold diuretics for now - Guarded prognosis, appreciate palliative help, patient now DNR  Best Practice (right click and "Reselect all SmartList Selections" daily)   Diet/type: cardiac DVT prophylaxis: DOAC GI prophylaxis: PPI Lines: Central line Foley:  Yes, and it is still needed Code Status:  DNR Last date of multidisciplinary goals of care discussion [ongoing]  Patient critically ill due to shock Interventions to address this today pressor titration Risk of deterioration without these interventions is high  I personally spent 34 minutes providing critical care not including any separately billable procedures  Erskine Emery MD [place] [facility]  Prefer epic messenger for cross cover needs If after hours, please call E-link

## 2021-04-25 NOTE — Progress Notes (Signed)
This chaplain is present with the Pt., Pt. daughter and brother. The chaplain is joined by the notary and two witnesses for the notarizing of the Pt. Advance Directive:  HCPOA.  The Pt. has chosen Joshua [name] to serve as his healthcare agent.  If the healthcare agent is unwilling or unable to serve, the following people have been given permission to serve in succession by the Pt.: Elizabeth Sauer, and [employer].  The chaplain gave the Pt. the original AD and three copies.  The chaplain scanned a copy of the Pt. AD into his EMR.  This chaplain is available for F/U spiritual care as needed.

## 2021-04-25 NOTE — Progress Notes (Signed)
K+ 3.0, Mg 1.1 Replaced per protocol

## 2021-04-25 NOTE — Progress Notes (Addendum)
This chaplain responded to PMT consult for notarizing the Pt. Advance Directive.  The Pt. Daughter-Charlene is at the bedside.    The chaplain understands the Pt. is questioning his Code status and doesn't remember communicating his choice for DNR to any one.  The Pt. Is looking for a F/U visit from a provider to clarify.  The chaplain asked clarifying questions about the Pt. Advance Directive. The chaplain understands the Pt. will reread the AD document he filled out yesterday. The chaplain understands the Pt. may benefit from the more concise version of the AD document.  The chaplain will provide the more concise document for the Pt. review.  This chaplain will F/U with the Pt. and family before the end of the day.  **1230 This chaplain finalized Pt. AD paperwork and is attempting to schedule a notary visit for today.

## 2021-04-25 NOTE — Evaluation (Signed)
Clinical/Bedside Swallow Evaluation Patient Details  Name: Joshua [name] MRN: [id number] Date of Birth: 1936-08-13  Today's Date: 04/25/2021 Time: SLP Start Time (ACUTE ONLY): 38 SLP Stop Time (ACUTE ONLY): 3329 SLP Time Calculation (min) (ACUTE ONLY): 15 min  Past Medical History:  Past Medical History:  Diagnosis Date   Arthritis    BPH (benign prostatic hyperplasia)    COPD (chronic obstructive pulmonary disease) ([place])    Folliculitis    posterior scalp per office visit note of Dr Maudie Mercury 07/20/2014     GERD (gastroesophageal reflux disease)    Hypertension    PE (pulmonary thromboembolism) ([place])    Phlebitis    right arm  at least 20 years ago    Pneumonia    hx of pneumonia as a child    Sleep apnea    Past Surgical History:  Past Surgical History:  Procedure Laterality Date   BIOPSY  11/03/2020   Procedure: BIOPSY;  Surgeon: Carol Ada, MD;  Location: [facility];  Service: Endoscopy;;   bone removed from little toe right foot      CHOLECYSTECTOMY     ESOPHAGOGASTRODUODENOSCOPY (EGD) WITH PROPOFOL N/A 11/03/2020   Procedure: ESOPHAGOGASTRODUODENOSCOPY (EGD) WITH PROPOFOL;  Surgeon: Carol Ada, MD;  Location: [facility];  Service: Endoscopy;  Laterality: N/A;   pilonidal cyst removal      RIGHT HEART CATH N/A 12/13/2020   Procedure: RIGHT HEART CATH;  Surgeon: Larey Dresser, MD;  Location: [facility];  Service: Cardiovascular;  Laterality: N/A;   TOTAL KNEE ARTHROPLASTY Left 09/13/2014   Procedure: LEFT TOTAL KNEE ARTHROPLASTY;  Surgeon: Mauri Pole, MD;  Location: [facility];  Service: Orthopedics;  Laterality: Left;   HPI:  85yo male admitted 04/24/21 with worsening BLE swelling. PMH: 04/12/21 acute on chronic right sided heart failure, chronic diastolic/right sided CHF, hypotension, COPD, BPH, CKD3a, PE   Assessment / Plan / Recommendation Clinical Impression  Pt seen at bedside to assess swallow function and safety. Pt was seated upright in recliner. RN  and pt report no difficulty swallowing. Pt reports having upper and lower dentures. CN exam unremarkable. Pt was observed with thin liquids, puree, and solid textures. No obvious oral issues noted, and no overt s/s aspiration observed on any consistency. Recommend continuing with current diet. No further ST intervention recommended at this time.  SLP Visit Diagnosis: Dysphagia, unspecified (R13.10)    Aspiration Risk  Mild aspiration risk    Diet Recommendation Regular;Thin liquid   Liquid Administration via: Cup;Straw Medication Administration: Whole meds with liquid Supervision: Patient able to self feed Compensations: Slow rate;Small sips/bites Postural Changes: Seated upright at 90 degrees    Other  Recommendations Oral Care Recommendations: Oral care BID   Follow up Recommendations None          Prognosis Prognosis for Safe Diet Advancement: Good      Swallow Study   General Date of Onset: 04/24/21 HPI: 85yo male admitted 04/24/21 with worsening BLE swelling. PMH: 04/12/21 acute on chronic right sided heart failure, chronic diastolic/right sided CHF, hypotension, COPD, BPH, CKD3a, PE Type of Study: Bedside Swallow Evaluation Previous Swallow Assessment: none Diet Prior to this Study: Regular;Thin liquids Temperature Spikes Noted: No Respiratory Status: Room air Behavior/Cognition: Alert;Cooperative;Pleasant mood Oral Cavity Assessment: Within Functional Limits Oral Care Completed by SLP: No Oral Cavity - Dentition: Dentures, top;Dentures, bottom Vision: Functional for self-feeding Self-Feeding Abilities: Able to feed self Patient Positioning: Upright in chair Baseline Vocal Quality: Normal Volitional Cough: Strong Volitional Swallow:  Able to elicit    Oral/Motor/Sensory Function Overall Oral Motor/Sensory Function: Within functional limits   Ice Chips Ice chips: Not tested   Thin Liquid Thin Liquid: Within functional limits Presentation: Straw    Nectar Thick  Nectar Thick Liquid: Not tested   Honey Thick Honey Thick Liquid: Not tested   Puree Puree: Within functional limits Presentation: Self Fed;Spoon   Solid     Solid: Within functional limits Presentation: [place] B. Quentin Ore, [facility], [place] Speech Language Pathologist Office: [phone]  Shonna Chock 04/25/2021,12:44 PM

## 2021-04-25 NOTE — Progress Notes (Signed)
CH encountered pt.'s dtr. Charlene while en route to 2H.  Dtr. shared that pt. 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  Dtr. requested prayer for these concerns.  Chaplain Dorian Pod is also following to provide further support in questions about advanced directives and goals of care.   Lindaann Pascal, Chaplain Pager: [phone]

## 2021-04-25 NOTE — Progress Notes (Signed)
SLP Cancellation Note  Patient Details Name: QUINDELL SHERE MRN: [id number] DOB: [birth year]   Cancelled treatment:       Reason Eval/Treat Not Completed: Patient at procedure or test/unavailable. Attempted to see pt for BSE. Pt currently in meeting with Palliative care and family members. Will continue efforts.  Ahaan Zobrist B. Quentin Ore, [facility], [place] Speech Language Pathologist Office: [phone]  Shonna Chock 04/25/2021, 10:40 AM

## 2021-04-26 ENCOUNTER — Encounter (HOSPITAL_COMMUNITY): Payer: Medicare Other

## 2021-04-26 DIAGNOSIS — R531 Weakness: Secondary | ICD-10-CM | POA: Diagnosis not present

## 2021-04-26 DIAGNOSIS — Z515 Encounter for palliative care: Secondary | ICD-10-CM | POA: Diagnosis not present

## 2021-04-26 DIAGNOSIS — N179 Acute kidney failure, unspecified: Secondary | ICD-10-CM | POA: Diagnosis not present

## 2021-04-26 DIAGNOSIS — R112 Nausea with vomiting, unspecified: Secondary | ICD-10-CM | POA: Diagnosis not present

## 2021-04-26 DIAGNOSIS — R6521 Severe sepsis with septic shock: Secondary | ICD-10-CM | POA: Diagnosis not present

## 2021-04-26 DIAGNOSIS — A419 Sepsis, unspecified organism: Secondary | ICD-10-CM | POA: Diagnosis not present

## 2021-04-26 DIAGNOSIS — I509 Heart failure, unspecified: Secondary | ICD-10-CM | POA: Diagnosis not present

## 2021-04-26 LAB — BASIC METABOLIC PANEL
Anion gap: 8 (ref 5–15)
BUN: 29 mg/dL — ABNORMAL HIGH (ref 8–23)
CO2: 37 mmol/L — ABNORMAL HIGH (ref 22–32)
Calcium: 9.4 mg/dL (ref 8.9–10.3)
Chloride: 87 mmol/L — ABNORMAL LOW (ref 98–111)
Creatinine, Ser: 1.08 mg/dL (ref 0.61–1.24)
GFR, Estimated: >60 mL/min (ref >60)
Glucose, Bld: 107 mg/dL — ABNORMAL HIGH (ref 70–99)
Potassium: 3.6 mmol/L (ref 3.5–5.1)
Sodium: 132 mmol/L — ABNORMAL LOW (ref 135–145)

## 2021-04-26 LAB — CBC
HCT: 45.4 % (ref 39.0–52.0)
Hemoglobin: 14.9 g/dL (ref 13.0–17.0)
MCH: 28.3 pg (ref 26.0–34.0)
MCHC: 32.8 g/dL (ref 30.0–36.0)
MCV: 86.1 fL (ref 80.0–100.0)
Platelets: 120 10*3/uL — ABNORMAL LOW (ref 150–400)
RBC: 5.27 MIL/uL (ref 4.22–5.81)
RDW: 15.8 % — ABNORMAL HIGH (ref 11.5–15.5)
WBC: 8 10*3/uL (ref 4.0–10.5)
nRBC: 0 % (ref 0.0–0.2)

## 2021-04-26 LAB — PROCALCITONIN: Procalcitonin: 0.56 ng/mL

## 2021-04-26 LAB — COOXEMETRY PANEL
Carboxyhemoglobin: 2.1 % — ABNORMAL HIGH (ref 0.5–1.5)
Methemoglobin: 0.7 % (ref 0.0–1.5)
O2 Saturation: 65.6 %
Total hemoglobin: 14.9 g/dL (ref 12.0–16.0)

## 2021-04-26 LAB — CULTURE, RESPIRATORY W GRAM STAIN: Culture: NORMAL

## 2021-04-26 LAB — MAGNESIUM: Magnesium: 1.8 mg/dL (ref 1.7–2.4)

## 2021-04-26 MED ORDER — SORBITOL 70 % SOLN
60.0000 mL | Freq: Every day | Status: DC
  Administered 2021-04-26 – 2021-04-30 (×4): 60 mL via ORAL
  Filled 2021-04-26 (×4): qty 60

## 2021-04-26 MED ORDER — MAGNESIUM SULFATE 2 GM/50ML IV SOLN
2.0000 g | Freq: Once | INTRAVENOUS | Status: AC
  Administered 2021-04-26: 2 g via INTRAVENOUS
  Filled 2021-04-26: qty 50

## 2021-04-26 MED ORDER — METOCLOPRAMIDE HCL 5 MG/ML IJ SOLN
10.0000 mg | Freq: Three times a day (TID) | INTRAMUSCULAR | Status: DC
  Administered 2021-04-26 – 2021-04-30 (×11): 10 mg via INTRAVENOUS
  Filled 2021-04-26 (×12): qty 2

## 2021-04-26 MED ORDER — POLYETHYLENE GLYCOL 3350 17 G PO PACK
17.0000 g | PACK | Freq: Every day | ORAL | Status: DC
  Administered 2021-04-26: 17 g via ORAL
  Filled 2021-04-26 (×2): qty 1

## 2021-04-26 MED ORDER — SENNOSIDES-DOCUSATE SODIUM 8.6-50 MG PO TABS
1.0000 | ORAL_TABLET | Freq: Every day | ORAL | Status: DC
  Administered 2021-04-26: 1 via ORAL
  Filled 2021-04-26 (×2): qty 1

## 2021-04-26 MED ORDER — BISACODYL 10 MG RE SUPP
10.0000 mg | Freq: Every day | RECTAL | Status: DC
  Filled 2021-04-26 (×2): qty 1

## 2021-04-26 MED ORDER — POTASSIUM CHLORIDE CRYS ER 20 MEQ PO TBCR
40.0000 meq | EXTENDED_RELEASE_TABLET | Freq: Once | ORAL | Status: AC
  Administered 2021-04-26: 40 meq via ORAL
  Filled 2021-04-26: qty 2

## 2021-04-26 MED ORDER — BACLOFEN 5 MG HALF TABLET
5.0000 mg | ORAL_TABLET | Freq: Two times a day (BID) | ORAL | Status: DC | PRN
  Administered 2021-04-26 – 2021-04-27 (×2): 5 mg via ORAL
  Filled 2021-04-26 (×4): qty 1

## 2021-04-26 NOTE — Progress Notes (Signed)
   NAME:  Joshua [name], MRN:  [id number], DOB:  02/17/1936, LOS: 7 ADMISSION DATE:  04/19/2021, CONSULTATION DATE:  04/24/2021 REFERRING MD:  Dr. Aundra Dubin, CHIEF COMPLAINT:  Hypotension   History of Present Illness:  85 year old male with end stage RV failure on home midodrine, presents to ED on 9/1 with reported progressive nausea/vomiting for the last 48 hours. Recent discharge 8/30 for CHF exacerbation, started on Farxiga and midodrine On arrival to ED BP 61/41. HR 87. Temp 99.1. CXR with increased interstitial coarsening/mild edema. CT A/P with narrowing of narrowing third portion of duodenum between aneurysmal abdominal aorta or and SMV with upstream dilation suggestive of nutcracker phenomenon, small ventral abdominal hernia containing a loop of nonobstructed small bowel. Cardiology, General Surgery Consulted. Underwent upper GI series with no signs of significant duodenal narrowing/stricture. General Surgery Signed off. Throughout stay treated for CHF exacerbation, complicated by AKI and hypotension.   9/6 with hypotension with systolic 97-02O requiring transfer to ICU for vasopressors.   Pertinent  Medical History  Chronic Diastolic HF, Chronic RV failure, Hypotension on midodrine, COPD on 3L , BPH, CKD stage 3, PE on Eliquis, Self Caths 3 times per day    Significant Hospital Events: Including procedures, antibiotic start and stop dates in addition to other pertinent events   9/1 Presents to ED  9/6 Transferred to ICU for vasopressors   Interim History / Subjective:  Bps improved. Able to walk around unit yesterday. Remains on low dose pressors. No BM in multiple days. Tolerating PO and no further N/V.  Objective   Blood pressure (!) 96/57, pulse 87, temperature 98.4 F (36.9 C), temperature source Oral, resp. rate 16, height _0  (1.905 m), weight 99.9 kg, SpO2 94 %. CVP:  [2 mmHg-54 mmHg] 5 mmHg      Intake/Output Summary (Last 24 hours) at 04/26/2021 0844 Last data filed at  04/26/2021 0600 Gross per 24 hour  Intake 2587.9 ml  Output 1675 ml  Net 912.9 ml    Filed Weights   04/25/21 0000 04/26/21 0500 04/26/21 0600  Weight: 95.2 kg 99.9 kg 99.9 kg    Examination: No distress Improved speech strength and less retained secretions Lung sounds improved Heart regular, ext warm Abdomen soft, hypactive BS, nontender Moves all 4 ext to command  Coox fine K low being replaced Mg low being replaced Renal function improved  Resolved Hospital Problem list     Assessment & Plan:  Acute on chronic shock probably related to aspiration, ileus in patient with little cardiopulmonary reserve improving   Group 2/3/4 pulmonary HTN on adempas   Acute liver injury of unclear etiology improving  - Keep encouraging mobility - Standing reglan, docusate, senna, dulcolax - Encourage protein shakes - Wean levophed to MAP 65, continue PTA midodrine - Cefepime x 7 days - Continue AC, adempas  Best Practice (right click and "Reselect all SmartList Selections" daily)   Diet/type: cardiac DVT prophylaxis: DOAC GI prophylaxis: PPI Lines: Central line Foley:  Yes, and it is still needed Code Status:  DNR Last date of multidisciplinary goals of care discussion [ongoing]   Patient critically ill due to shock Interventions to address this today pressor titration Risk of deterioration without these interventions is high  I personally spent 32 minutes providing critical care not including any separately billable procedures  Erskine Emery MD [place] [facility]  Prefer epic messenger for cross cover needs If after hours, please call E-link

## 2021-04-26 NOTE — Progress Notes (Signed)
Patient ID: Joshua [name], male   DOB: Feb 01, 1936, 85 y.o.   MRN: [id number]     Advanced Heart Failure Rounding Note  PCP-Cardiologist: Sherren Mocha, MD  HF: Dr. Aundra Dubin  Subjective:    Patient admitted 09/01 with N, V, hypotension and AKI on CKD after recent admission for a/c diastolic HF/RV failure. Gave IV fluids and Midodrine increased to 15 mg TID to support BP.  Transferred to CCU 9/6 for lethargy, fever and hypotension w/ concern for developing septic shock. WBC 12. Temp 100. PCT 1.04. UA and CXR negative. Co-ox ok at 64%. Diuretics held. Started on NE and broad spectrum abx. Eliquis held for hemoptysis.   Now DNR/DNI w/ palliative care team following.   He is now afebrile, WBCs normal, he remains on cefepime.  Norepinephrine 3 with SBP around 85, no lightheadedness.  He is still on midodrine 15 tid. Co-ox 66%.  CVP 5.    Speech/swallow saw, low aspiration risk. Tolerating diet.    Objective:   Weight Range: 99.9 kg Body mass index is 27.52 kg/m.   Vital Signs:   Temp:  [98.9 F (37.2 C)-99.3 F (37.4 C)] 98.9 F (37.2 C) (09/07 2000) Pulse Rate:  [87-123] 87 (09/08 0600) Resp:  [10-21] 16 (09/08 0600) BP: (75-116)/(44-75) 96/57 (09/08 0600) SpO2:  [87 %-100 %] 94 % (09/08 0600) Weight:  [99.9 kg] 99.9 kg (09/08 0600) Last BM Date: 04/12/21  Weight change: Filed Weights   04/25/21 0000 04/26/21 0500 04/26/21 0600  Weight: 95.2 kg 99.9 kg 99.9 kg    Intake/Output:   Intake/Output Summary (Last 24 hours) at 04/26/2021 0741 Last data filed at 04/26/2021 0600 Gross per 24 hour  Intake 2587.9 ml  Output 1675 ml  Net 912.9 ml      Physical Exam    CVP 5  General: NAD Neck: No JVD, no thyromegaly or thyroid nodule.  Lungs: Clear to auscultation bilaterally with normal respiratory effort. CV: Nondisplaced PMI.  Heart regular S1/S2, no S3/S4, no murmur.  No peripheral edema.   Abdomen: Soft, nontender, no hepatosplenomegaly, no distention.  Skin: Intact  without lesions or rashes.  Neurologic: Alert and oriented x 3.  Psych: Normal affect. Extremities: No clubbing or cyanosis.  HEENT: Normal.    Telemetry   Sinus 90s Personally reviewed  Labs    CBC Recent Labs    04/25/21 0440 04/26/21 0446  WBC 12.1* 8.0  HGB 15.5 14.9  HCT 47.8 45.4  MCV 88.5 86.1  PLT 108* 120*    Basic Metabolic Panel Recent Labs    04/25/21 0440 04/25/21 1353 04/25/21 2006 04/26/21 0446  NA 137 133*  --  132*  K 3.0* 3.9  --  3.6  CL 92* 91*  --  87*  CO2 33* 35*  --  37*  GLUCOSE 109* 140*  --  107*  BUN 27* 33*  --  29*  CREATININE 1.40* 1.47*  --  1.08  CALCIUM 8.5* 8.9  --  9.4  MG 1.1*  --  2.0 1.8  PHOS 2.9  --   --   --    Liver Function Tests Recent Labs    04/24/21 0027 04/25/21 0440  AST 173* 56*  ALT 459* 226*  ALKPHOS 90 71  BILITOT 2.2* 1.9*  PROT 6.8 5.6*  ALBUMIN 3.1* 2.4*   No results for input(s): LIPASE, AMYLASE in the last 72 hours.  Cardiac Enzymes No results for input(s): CKTOTAL, CKMB, CKMBINDEX, TROPONINI in the last 72 hours.  BNP: BNP (last 3 results) Recent Labs    04/12/21 1435 04/19/21 0915 04/22/21 0958  BNP 1,597.1* 2,618.0* 1,156.1*    ProBNP (last 3 results) No results for input(s): PROBNP in the last 8760 hours.   D-Dimer No results for input(s): DDIMER in the last 72 hours. Hemoglobin A1C No results for input(s): HGBA1C in the last 72 hours. Fasting Lipid Panel No results for input(s): CHOL, HDL, LDLCALC, TRIG, CHOLHDL, LDLDIRECT in the last 72 hours. Thyroid Function Tests No results for input(s): TSH, T4TOTAL, T3FREE, THYROIDAB in the last 72 hours.  Invalid input(s): FREET3  Other results:   Imaging    No results found.   Medications:     Scheduled Medications:  allopurinol  100 mg Oral q morning   apixaban  5 mg Oral BID   Chlorhexidine Gluconate Cloth  6 each Topical Daily   feeding supplement  237 mL Oral TID BM   finasteride  5 mg Oral QHS   gabapentin   200 mg Oral TID   midodrine  15 mg Oral TID WC   multivitamin with minerals  1 tablet Oral Daily   pantoprazole  40 mg Oral q morning   Riociguat  1.5 mg Oral TID   sodium chloride flush  3 mL Intravenous Q12H   umeclidinium bromide  1 puff Inhalation QPM    Infusions:  sodium chloride     ceFEPime (MAXIPIME) IV Stopped (04/25/21 2147)   magnesium sulfate bolus IVPB 2 g (04/26/21 0653)   norepinephrine (LEVOPHED) Adult infusion 3 mcg/min (04/26/21 0600)    PRN Medications: sodium chloride, albuterol, baclofen, calcium carbonate, ipratropium, metoCLOPramide (REGLAN) injection, ondansetron **OR** ondansetron (ZOFRAN) IV, sodium chloride flush   Assessment/Plan  1.  Shock: Suspect mixed septic and cardiogenic (baseline RV failure).  Suspect possible gut translocation with ileus/partial SBO. Cultures negative, afebrile with normal WBCs.  - Continue cefepime.  - Continue norepinephrine, can titrate down for SBP 85 and above.  - Continue midodrine 15 mg tid.  2. Chronic HFpEF/RV failure: Echo (2/22) with EF 55-60%, IV septum flattened, severe RV enlargement, severely decreased RV function, PASP 57 mmHg.  He has end-stage RV failure.  Diuretics initially held this admission with over-diuresis. Remains on hold in setting of septic shock. CVP 5. Co-ox ok at 66%  - continue NE for BP support, wean as tolerated as above.  - Midodrine increased to 15 mg TID to support BP - TED hose - continue to hold diuretics for now  3. Pulmonary HTN: PAH noted on 4/22 RHC with PVR 6.1 WU.  This appears to be multifactorial with OSA, severe emphysema, and a suspected chronic PE involving the right middle lobe (group 3 and group 4 PH). Given the suspected mixed etiology with only 1 area of chronic thromboembolism (right middle lobe) as well as age, do not think that pulmonary thromboendarterectomy would be indicated.  Rheumatologic serologic workup was negative.  PFTs showed severe obstruction and moderate  restriction, suggesting significant COPD. - Continue riociguat for CTEPH, 1.5 mg bid.  - c/w supp O2  - On chronic 02 at home 4. AKI on Stage III CKD: Baseline SCr ~1.6. Scr 1.47 at d/c on 08/30. Up to 3.69 on admit -> 1.77 -> 1.08 today.  Likely AKI due to hypovolemia and RV failure with nausea/vomiting poor po intake and ongoing diuretic use + septic shock  5. Elevated troponin:  HS trop 247 > 499 > 717 > 893>102. No chest pain.  Likely due to demand ischemia  from hypovolemia/low BP.  6. Transaminitis/shock liver from hypotension: Improving. No significant abnormality identified on RUQ [place]. Small amount of perihepatic ascites. Ammonia level ok at 14 (9/6) 7. Ileus/partial SBO:  CT abdomen 04/19/21 with narrowing distal third duodenum with upstream dilatation. 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 Suspect ileus versus partial SBO, not certain this is due to the "nutcracker phenomenon" that has been there for a long time.  Has bowel sounds. No aspiration noted on swallow evaluation.  - has been seen by [facility]. Not surgical candidate.  - Currently tolerating a solid diet.   8. OSA: Untreated.  Insurance would apparently not cover home sleep study.  Need to address this as outpatient. Can he get home SS (wants this) versus lab sleep study? 9. Emphysema: Prior smoker.  Emphysema on CT and severe obstruction on PFTs - He uses home oxygen  10. Chronic PE: Continue apixaban.  11. BPH with urinary retention: He has had issues with this in the past, often self caths 3-4x/day. Concern for hygiene? Placed foley. - On Flomax.  - Had outpatient f/u scheduled but missed d/t admission 12. Hiccups: Have been intractable.   - On gabapentin.  - Add baclofen.   13. Hypokalemia/ Hypomagnesemia: Replace K.   Patient is now DNR/DNI.  Mobilize today and work on titrating off norepinephrine.   CRITICAL CARE Performed by: Loralie Champagne  Total critical care time: 35 minutes  Critical care time was exclusive of separately billable procedures and treating other patients.  Critical care was necessary to treat or prevent imminent or life-threatening deterioration.  Critical care was time spent personally by me on the following activities: development of treatment plan with patient and/or surrogate as well as nursing, discussions with consultants, evaluation of patient's response to treatment, examination of patient, obtaining history from patient or surrogate, ordering and performing treatments and interventions, ordering and review of laboratory studies, ordering and review of radiographic studies, pulse oximetry and re-evaluation of patient's condition.  Loralie Champagne 04/26/2021 7:41 AM

## 2021-04-26 NOTE — Progress Notes (Signed)
K+ 3.6, Mg 1.8 Replaced per protocol

## 2021-04-26 NOTE — Progress Notes (Addendum)
Calorie Count Note  48 hour calorie count ordered.  Diet: Regular Supplements: Ensure Enlive - TID  Breakfast 9/7: N/A Lunch 9/7: Chicken Noodle Soup & Vanilla Pudding Dinner 9/7: Grilled Cheese Sandwich & 1/2 baked chips Supplements: 3 Ensure Enlive Breakfast 9/8: 1/2 eggs, bacon, english muffin w/ cream cheese, coffee, juice   Nutrition Dx: Inadequate oral intake related to decrease appetite as evidence by per patient/family report.   Goal: Patient will meet greater than or equal to 90% of their needs.  Intervention:  - Continue Ensure Enlive - TID  - Continue MVI - Daily - Magic Cup - TID   Will dc calorie count, continue interventions due to pt eating and drinking Ensure's.  Hermina Barters BS, PLDN Clinical Dietitian See [facility] for contact information.

## 2021-04-26 NOTE — Progress Notes (Signed)
Physical Therapy Treatment Patient Details Name: Joshua [name] MRN: [id number] DOB: 01-12-1936 Today's Date: 04/26/2021    History of Present Illness Pt is an 85 y.o. male admitted 04/19/21 with intractable nausea/vomiting. CT of abdomen/pelvis showed narrowing of duodenum and SMV with upstream dilation suggestive of nutcracker phenomenon. Course complicated by AKI, hypotension; transfer to ICU 9/6 for vasopressors. Of note, recent admission 04/12/21-04/17/21 for CHF. Other PMH includes COPD (on 2L O2 baseline), pulmonary HTN, CKD, PE on Eliquis, L TKA.   PT Comments    Pt progressing with mobility. Today's session focused on continued gait training with RW, as well as improving activity tolerance and strength via ambulation and therex. Pt moving well and motivated to participate; pt's wife present and supportive. Pt remains limited by generalized weakness, decreased activity tolerance, and impaired balance strategies/postural reactions. Will continue to follow acutely to address established goals.  SpO2 >/94% on RA HR 90s-100s Post-mobility BP 114/67    Follow Up Recommendations  Home health PT;Supervision for mobility/OOB     Equipment Recommendations  None recommended by PT    Recommendations for Other Services       Precautions / Restrictions Precautions Precautions: Fall Precaution Comments: Watch SpO2 (wears 2L O2 baseline), hypotensive Restrictions Weight Bearing Restrictions: No    Mobility  Bed Mobility               General bed mobility comments: Received sitting in recliner    Transfers Overall transfer level: Needs assistance Equipment used: Rolling walker (2 wheeled) Transfers: Sit to/from Stand Sit to Stand: Supervision         General transfer comment: Multiple sit<>stands from recliner to RW with supervision for safety/lines; good technique, reliance on BUE support  Ambulation/Gait Ambulation/Gait assistance: Supervision Gait Distance (Feet): 600  Feet Assistive device: Rolling walker (2 wheeled)   Gait velocity: Decreased   General Gait Details: Pt requesting use of RW for ambulation; slow, steady gait with RW and supervision for safety/lines; SpO2 >/94% on 3L O2 [place]   Stairs             Wheelchair Mobility    Modified Rankin (Stroke Patients Only)       Balance Overall balance assessment: Needs assistance Sitting-balance support: Feet supported;No upper extremity supported Sitting balance-Leahy Scale: Good       Standing balance-Leahy Scale: Fair Standing balance comment: able to static stand and march without UE support                            Cognition Arousal/Alertness: Awake/alert Behavior During Therapy: WFL for tasks assessed/performed Overall Cognitive Status: Within Functional Limits for tasks assessed                                        Exercises Other Exercises Other Exercises: 5x repeated sit<>stand (reliant on UE support), seated LAQ    General Comments General comments (skin integrity, edema, etc.): Pt's wife present and supportive      Pertinent Vitals/Pain Pain Assessment: No/denies pain Pain Intervention(s): Monitored during session    Home Living                      Prior Function            PT Goals (current goals can now be found in the care plan section) Progress towards  PT goals: Progressing toward goals    Frequency    Min 3X/week      PT Plan Current plan remains appropriate    Co-evaluation              AM-PAC PT "6 Clicks" Mobility   Outcome Measure  Help needed turning from your back to your side while in a flat bed without using bedrails?: None Help needed moving from lying on your back to sitting on the side of a flat bed without using bedrails?: None Help needed moving to and from a bed to a chair (including a wheelchair)?: A Little Help needed standing up from a chair using your arms (e.g., wheelchair  or bedside chair)?: A Little Help needed to walk in hospital room?: A Little Help needed climbing 3-5 steps with a railing? : A Little 6 Click Score: 20    End of Session Equipment Utilized During Treatment: Gait belt;Oxygen Activity Tolerance: Patient tolerated treatment well Patient left: in chair;with call bell/phone within reach;with family/visitor present Nurse Communication: Mobility status PT Visit Diagnosis: Unsteadiness on feet (R26.81);Muscle weakness (generalized) (M62.81)     Time: 3149-7026 PT Time Calculation (min) (ACUTE ONLY): 27 min  Charges:  $Gait Training: 8-22 mins $Therapeutic Exercise: 8-22 mins                     Mabeline Caras, PT, DPT Acute Rehabilitation Services  Pager [phone] Office [place] 04/26/2021, 4:52 PM

## 2021-04-26 NOTE — Progress Notes (Signed)
Occupational Therapy Treatment Patient Details Name: Joshua [name] MRN: [id number] DOB: 1936/02/15 Today's Date: 04/26/2021    History of present illness Pt is an 85 y.o. male admitted 04/19/21 with intractable nausea/vomiting. CT of abdomen/pelvis showed narrowing of duodenum and SMV with upstream dilation suggestive of nutcracker phenomenon. Course complicated by AKI, hypotension; transfer to ICU 9/6 for vasopressors. Of note, recent admission 04/12/21-04/17/21 for CHF. Other PMH includes COPD (on 2L O2 baseline), pulmonary HTN, CKD, PE on Eliquis, L TKA.   OT comments  Pt completed bathing and dressing, sit<>stand from [facility] at bathroom sink. Overall required set up to min assist. Ambulated with RW with supervision and line management. Pt on 3L 02 with stable VS throughout.   Follow Up Recommendations  Home health OT;Supervision - Intermittent    Equipment Recommendations  None recommended by OT    Recommendations for Other Services      Precautions / Restrictions Precautions Precautions: Fall Precaution Comments: Watch SpO2 (wears 2L O2 baseline), hypotensive       Mobility Bed Mobility               General bed mobility comments: Received sitting in recliner    Transfers Overall transfer level: Needs assistance Equipment used: Rolling walker (2 wheeled) Transfers: Sit to/from Stand Sit to Stand: Min guard         General transfer comment: good technique, slow to rise with increased effort    Balance Overall balance assessment: Needs assistance   Sitting balance-Leahy Scale: Good     Standing balance support: Bilateral upper extremity supported;During functional activity;No upper extremity supported Standing balance-Leahy Scale: Fair Standing balance comment: can release walker to wash periarea without LOB                           ADL either performed or assessed with clinical judgement   ADL Overall ADL's : Needs assistance/impaired      Grooming: Wash/dry hands;Wash/dry face;Sitting;Set up   Upper Body Bathing: Minimal assistance;Sitting Upper Body Bathing Details (indicate cue type and reason): assisted for back     Upper Body Dressing : Set up;Sitting       Toilet Transfer: Ambulation;Supervision/safety;BSC;RW   Toileting- Clothing Manipulation and Hygiene: Sit to/from stand;Supervision/safety       Functional mobility during ADLs: Supervision/safety;Rolling walker       Vision       Perception     Praxis      Cognition Arousal/Alertness: Awake/alert Behavior During Therapy: WFL for tasks assessed/performed Overall Cognitive Status: Within Functional Limits for tasks assessed                                          Exercises     Shoulder Instructions       General Comments      Pertinent Vitals/ Pain       Pain Assessment: No/denies pain  Home Living                                          Prior Functioning/Environment              Frequency  Min 2X/week        Progress Toward Goals  OT Goals(current goals can now be  found in the care plan section)  Progress towards OT goals: Progressing toward goals  Acute Rehab OT Goals Patient Stated Goal: to be able to go home OT Goal Formulation: With patient Time For Goal Achievement: 05/05/21 Potential to Achieve Goals: Good  Plan Discharge plan remains appropriate    Co-evaluation                 AM-PAC OT "6 Clicks" Daily Activity     Outcome Measure   Help from another person eating meals?: None Help from another person taking care of personal grooming?: A Little Help from another person toileting, which includes using toliet, bedpan, or urinal?: A Little Help from another person bathing (including washing, rinsing, drying)?: A Little Help from another person to put on and taking off regular upper body clothing?: A Little Help from another person to put on and taking off  regular lower body clothing?: A Little 6 Click Score: 19    End of Session Equipment Utilized During Treatment: Rolling walker;Oxygen  OT Visit Diagnosis: Unsteadiness on feet (R26.81);Other abnormalities of gait and mobility (R26.89);Muscle weakness (generalized) (M62.81)   Activity Tolerance Patient tolerated treatment well   Patient Left in chair;with call bell/phone within reach;with family/visitor present   Nurse Communication          Time: 3818-4037 OT Time Calculation (min): 40 min  Charges: OT General Charges $OT Visit: 1 Visit OT Treatments $Self Care/Home Management : 38-52 mins  [name], OTR/L Acute Rehabilitation Services Pager: [phone] Office: [phone]    Malka So 04/26/2021, 12:17 PM

## 2021-04-26 NOTE — Progress Notes (Signed)
Palliative Care Progress Note, Assessment & Plan   Patient Name: Joshua [name]       Date: 04/26/2021 DOB: 12-Mar-1936  Age: 85 y.o. MRN#: [id number] Attending Physician: Candee Furbish, MD Primary Care Physician: [name], DO Admit Date: 04/19/2021  Reason for Consultation/Follow-up: Establishing goals of care  Subjective: Patient is out of bed to the chair, no complaints voiced currently, wife at bedside   HPI: 85 y.o. male  with past medical history of BPH (self cath), COPD (2L home oxygen), GERD, hypotension, dCHF, CKD, PE, and sleep apnea.   He was admitted on 04/19/2021 from home with intractable nausea/vomiting. Patient recently discharged on 8/30 for CHF exacerbation. During work-up BNP 2618, potassium 5.5, and BP 61/41. CT of abdomen/pelvis showed narrowing of duodenum and SMV with upstream dilation suggestive of nutcracker phenomenon.  Further complications with hypertension, currently remains in the ICU  Patient faces treatment option decisions, advanced directive decisions and anticipatory care needs.   Goals of care  Continued education  at bedside today regarding current medical situation.  Patient and his wife verbalized an understanding of the seriousness of his current medical situation.  At this time [name] remains open to all offered and available medical interventions to prolong quality of life and is hopeful for improvement.  Ultimately he hopes for discharge home and to continue with life.  Both patient and wife voiced no concerns regarding detailed advance care planning discussions had over the last several days.   Education offered with patient and his wife the importance of continued conversation with each other and the medical providers regarding overall plan of care and  treatment options,  ensuring decisions are within the context of the patients values and GOCs.  Questions and concerns were addressed.   The patient/ family was encouraged to call with questions or concerns.   PMT will continue to support holistically   Code Status: Limited code  Prognosis:  Unable to determine  Discharge Planning: To Be Determined     Length of Stay: 7  Physical Exam Constitutional:      Appearance: Normal appearance.  HENT:     Head: Normocephalic and atraumatic.     Mouth/Throat:     Mouth: Mucous membranes are moist.  Cardiovascular:     Rate and Rhythm: Tachycardia present.  Pulmonary:     Effort: Pulmonary effort is normal.     Comments: Thick secretions that patient is able to clear with a strong cough Neurological:     Mental Status: He is alert and oriented to person, place, and time.  Psychiatric:        Mood and Affect: Mood normal.        Behavior: Behavior normal.        Thought Content: Thought content normal.            Vital Signs: BP (!) 96/57   Pulse 87   Temp 98.4 F (36.9 C) (Oral)   Resp 16   Ht 6' 3" (1.905 m)   Wt 99.9 kg   SpO2 94%   BMI 27.52 kg/m  SpO2: SpO2: 94 % O2 Device: O2 Device: Nasal Cannula O2 Flow Rate: O2 Flow Rate (L/min):  3 L/min    Total Time 25 minutes Prolonged Time Billed  no   Greater than 50%  of this time was spent counseling and coordinating care related to the above assessment and plan.  Thank you for allowing the Palliative Medicine Team to assist in the care of this patient.

## 2021-04-27 DIAGNOSIS — I50810 Right heart failure, unspecified: Secondary | ICD-10-CM | POA: Diagnosis not present

## 2021-04-27 DIAGNOSIS — I509 Heart failure, unspecified: Secondary | ICD-10-CM

## 2021-04-27 LAB — BASIC METABOLIC PANEL
Anion gap: 9 (ref 5–15)
BUN: 26 mg/dL — ABNORMAL HIGH (ref 8–23)
CO2: 28 mmol/L (ref 22–32)
Calcium: 9.5 mg/dL (ref 8.9–10.3)
Chloride: 95 mmol/L — ABNORMAL LOW (ref 98–111)
Creatinine, Ser: 1.07 mg/dL (ref 0.61–1.24)
GFR, Estimated: >60 mL/min (ref >60)
Glucose, Bld: 65 mg/dL — ABNORMAL LOW (ref 70–99)
Potassium: 4.6 mmol/L (ref 3.5–5.1)
Sodium: 132 mmol/L — ABNORMAL LOW (ref 135–145)

## 2021-04-27 LAB — CBC
HCT: 45.6 % (ref 39.0–52.0)
Hemoglobin: 14.8 g/dL (ref 13.0–17.0)
MCH: 27.8 pg (ref 26.0–34.0)
MCHC: 32.5 g/dL (ref 30.0–36.0)
MCV: 85.6 fL (ref 80.0–100.0)
Platelets: 106 10*3/uL — ABNORMAL LOW (ref 150–400)
RBC: 5.33 MIL/uL (ref 4.22–5.81)
RDW: 15.5 % (ref 11.5–15.5)
WBC: 5.5 10*3/uL (ref 4.0–10.5)
nRBC: 0 % (ref 0.0–0.2)

## 2021-04-27 LAB — MAGNESIUM: Magnesium: 1.8 mg/dL (ref 1.7–2.4)

## 2021-04-27 MED ORDER — MAGNESIUM SULFATE 2 GM/50ML IV SOLN
2.0000 g | Freq: Once | INTRAVENOUS | Status: AC
  Administered 2021-04-27: 2 g via INTRAVENOUS
  Filled 2021-04-27: qty 50

## 2021-04-27 MED ORDER — UMECLIDINIUM-VILANTEROL 62.5-25 MCG/INH IN AEPB
1.0000 | INHALATION_SPRAY | Freq: Every day | RESPIRATORY_TRACT | Status: DC
  Administered 2021-04-27 – 2021-04-30 (×4): 1 via RESPIRATORY_TRACT
  Filled 2021-04-27: qty 14

## 2021-04-27 NOTE — Progress Notes (Signed)
Occupational Therapy Treatment Patient Details Name: Joshua [name] MRN: [id number] DOB: August 26, 1935 Today's Date: 04/27/2021    History of present illness Pt is an 85 y.o. male admitted 04/19/21 with intractable nausea/vomiting. CT of abdomen/pelvis showed narrowing of duodenum and SMV with upstream dilation suggestive of nutcracker phenomenon. Course complicated by AKI, hypotension; transfer to ICU 9/6 for vasopressors. Of note, recent admission 04/12/21-04/17/21 for CHF. Other PMH includes COPD (on 2L O2 baseline), pulmonary HTN, CKD, PE on Eliquis, L TKA.   OT comments  Pt completed standing UB dressing with min assist and standing grooming with supervision. Ambulated in hall with supervision and RW on 3L 02 with stable VS and without rest breaks.  Follow Up Recommendations  Home health OT;Supervision - Intermittent    Equipment Recommendations  None recommended by OT    Recommendations for Other Services      Precautions / Restrictions Precautions Precautions: Fall Precaution Comments: Watch SpO2 (wears 2L O2 baseline)       Mobility Bed Mobility Overal bed mobility: Modified Independent                  Transfers Overall transfer level: Needs assistance Equipment used: Rolling walker (2 wheeled) Transfers: Sit to/from Stand Sit to Stand: Supervision              Balance     Sitting balance-Leahy Scale: Good     Standing balance support: No upper extremity supported;During functional activity Standing balance-Leahy Scale: Fair                             ADL either performed or assessed with clinical judgement   ADL Overall ADL's : Needs assistance/impaired     Grooming: Wash/dry hands;Standing;Supervision/safety           Upper Body Dressing : Minimal assistance;Standing Upper Body Dressing Details (indicate cue type and reason): front opening gown     Toilet Transfer: Ambulation;Supervision/safety;BSC;RW   Toileting- Clothing  Manipulation and Hygiene: Sit to/from stand;Supervision/safety       Functional mobility during ADLs: Supervision/safety;Rolling walker General ADL Comments: Ambulated in hall.     Vision       Perception     Praxis      Cognition Arousal/Alertness: Awake/alert Behavior During Therapy: WFL for tasks assessed/performed Overall Cognitive Status: Within Functional Limits for tasks assessed                                          Exercises     Shoulder Instructions       General Comments      Pertinent Vitals/ Pain       Pain Assessment: No/denies pain  Home Living Family/patient expects to be discharged to:: Private residence                                        Prior Functioning/Environment              Frequency  Min 2X/week        Progress Toward Goals  OT Goals(current goals can now be found in the care plan section)  Progress towards OT goals: Progressing toward goals  Acute Rehab OT Goals Patient Stated Goal: to be able to go home OT  Goal Formulation: With patient Time For Goal Achievement: 05/05/21 Potential to Achieve Goals: Good  Plan Discharge plan remains appropriate    Co-evaluation                 AM-PAC OT "6 Clicks" Daily Activity     Outcome Measure   Help from another person eating meals?: None Help from another person taking care of personal grooming?: A Little Help from another person toileting, which includes using toliet, bedpan, or urinal?: A Little Help from another person bathing (including washing, rinsing, drying)?: A Little Help from another person to put on and taking off regular upper body clothing?: A Little Help from another person to put on and taking off regular lower body clothing?: A Little 6 Click Score: 19    End of Session Equipment Utilized During Treatment: Rolling walker;Oxygen (3L)  OT Visit Diagnosis: Unsteadiness on feet (R26.81);Other abnormalities of  gait and mobility (R26.89);Muscle weakness (generalized) (M62.81)   Activity Tolerance Patient tolerated treatment well   Patient Left in chair;with call bell/phone within reach;with family/visitor present   Nurse Communication          Time: 4784-1282 OT Time Calculation (min): 31 min  Charges: OT General Charges $OT Visit: 1 Visit OT Treatments $Self Care/Home Management : 8-22 mins $Therapeutic Activity: 8-22 mins  [name], OTR/L Acute Rehabilitation Services Pager: [phone] Office: [phone]    Malka So 04/27/2021, 1:51 PM

## 2021-04-27 NOTE — Progress Notes (Addendum)
Patient ID: Joshua [name], male   DOB: 10-15-1935, 85 y.o.   MRN: [id number]     Advanced Heart Failure Rounding Note  PCP-Cardiologist: Sherren Mocha, MD  HF: Dr. Aundra Dubin  Subjective:    Patient admitted 09/01 with N, V, hypotension and AKI on CKD after recent admission for a/c diastolic HF/RV failure. Gave IV fluids and Midodrine increased to 15 mg TID to support BP.  Transferred to CCU 9/6 for lethargy, fever and hypotension w/ concern for developing septic shock. Started on broad spectrum antibiotics.   Now off Norepi.   Now DNR/DNI w/ palliative care team following.   Denies SOB. Wants to walk more.    Objective:   Weight Range: 99.8 kg Body mass index is 27.5 kg/m.   Vital Signs:   Temp:  [97.7 F (36.5 C)-98.8 F (37.1 C)] 97.7 F (36.5 C) (09/08 2300) Pulse Rate:  [86-111] 88 (09/09 0500) Resp:  [10-25] 12 (09/09 0500) BP: (87-119)/(53-80) 114/75 (09/09 0500) SpO2:  [91 %-100 %] 100 % (09/09 0500) Weight:  [99.8 kg] 99.8 kg (09/09 0600) Last BM Date: 04/12/21  Weight change: Filed Weights   04/26/21 0500 04/26/21 0600 04/27/21 0600  Weight: 99.9 kg 99.9 kg 99.8 kg    Intake/Output:   Intake/Output Summary (Last 24 hours) at 04/27/2021 0709 Last data filed at 04/27/2021 0400 Gross per 24 hour  Intake 231.21 ml  Output 1165 ml  Net -933.79 ml      Physical Exam   CVP 5 personally checked in the chair.  General:  Sitting in the chair.  No resp difficulty HEENT: normal Neck: supple. no JVD. Carotids 2+ bilat; no bruits. No lymphadenopathy or thryomegaly appreciated. RIJ  Cor: PMI nondisplaced. Regular rate & rhythm. No rubs, gallops or murmurs. Lungs: Crackles in the bases on 3 liters.  Abdomen: soft, nontender, nondistended. No hepatosplenomegaly. No bruits or masses. Good bowel sounds. Extremities: no cyanosis, clubbing, rash, edema Neuro: alert & orientedx3, cranial nerves grossly intact. moves all 4 extremities w/o difficulty. Affect pleasant GU:  Foley   Telemetry  SR 80-90s   Labs    CBC Recent Labs    04/26/21 0446 04/27/21 0113  WBC 8.0 5.5  HGB 14.9 14.8  HCT 45.4 45.6  MCV 86.1 85.6  PLT 120* 106*    Basic Metabolic Panel Recent Labs    04/25/21 0440 04/25/21 1353 04/26/21 0446 04/27/21 0113  NA 137   < > 132* 132*  K 3.0*   < > 3.6 4.6  CL 92*   < > 87* 95*  CO2 33*   < > 37* 28  GLUCOSE 109*   < > 107* 65*  BUN 27*   < > 29* 26*  CREATININE 1.40*   < > 1.08 1.07  CALCIUM 8.5*   < > 9.4 9.5  MG 1.1*   < > 1.8 1.8  PHOS 2.9  --   --   --    < > = values in this interval not displayed.   Liver Function Tests Recent Labs    04/25/21 0440  AST 56*  ALT 226*  ALKPHOS 71  BILITOT 1.9*  PROT 5.6*  ALBUMIN 2.4*   No results for input(s): LIPASE, AMYLASE in the last 72 hours.  Cardiac Enzymes No results for input(s): CKTOTAL, CKMB, CKMBINDEX, TROPONINI in the last 72 hours.  BNP: BNP (last 3 results) Recent Labs    04/12/21 1435 04/19/21 0915 04/22/21 0958  BNP 1,597.1* 2,618.0* 1,156.1*  ProBNP (last 3 results) No results for input(s): PROBNP in the last 8760 hours.   D-Dimer No results for input(s): DDIMER in the last 72 hours. Hemoglobin A1C No results for input(s): HGBA1C in the last 72 hours. Fasting Lipid Panel No results for input(s): CHOL, HDL, LDLCALC, TRIG, CHOLHDL, LDLDIRECT in the last 72 hours. Thyroid Function Tests No results for input(s): TSH, T4TOTAL, T3FREE, THYROIDAB in the last 72 hours.  Invalid input(s): FREET3  Other results:   Imaging    No results found.   Medications:     Scheduled Medications:  allopurinol  100 mg Oral q morning   apixaban  5 mg Oral BID   bisacodyl  10 mg Rectal Daily   Chlorhexidine Gluconate Cloth  6 each Topical Daily   feeding supplement  237 mL Oral TID BM   finasteride  5 mg Oral QHS   gabapentin  200 mg Oral TID   metoCLOPramide (REGLAN) injection  10 mg Intravenous Q8H   midodrine  15 mg Oral TID WC    multivitamin with minerals  1 tablet Oral Daily   pantoprazole  40 mg Oral q morning   polyethylene glycol  17 g Oral Daily   Riociguat  1.5 mg Oral TID   senna-docusate  1 tablet Oral Daily   sodium chloride flush  3 mL Intravenous Q12H   sorbitol  60 mL Oral Q0600   umeclidinium bromide  1 puff Inhalation QPM    Infusions:  sodium chloride     ceFEPime (MAXIPIME) IV 2 g (04/26/21 2212)   norepinephrine (LEVOPHED) Adult infusion Stopped (04/26/21 1102)    PRN Medications: sodium chloride, albuterol, baclofen, calcium carbonate, ipratropium, metoCLOPramide (REGLAN) injection, ondansetron **OR** ondansetron (ZOFRAN) IV, sodium chloride flush   Assessment/Plan  1.  Shock: Suspect mixed septic and cardiogenic (baseline RV failure).  Suspect possible gut translocation with ileus/partial SBO. Cultures negative, afebrile with normal WBCs.  - Continue cefepime until 04/30/21  - Stable off norepi  - Continue midodrine 15 mg tid.  2. Chronic HFpEF/RV failure: Echo (2/22) with EF 55-60%, IV septum flattened, severe RV enlargement, severely decreased RV function, PASP 57 mmHg.  He has end-stage RV failure.  Diuretics initially held this admission with over-diuresis. Remains on hold in setting of septic shock.  - Stable on NE.  - Continue Midodrine  15 mg TID - TED hose - CVP 5. Continue to hold diuretics for now  3. Pulmonary HTN: PAH noted on 4/22 RHC with PVR 6.1 WU.  This appears to be multifactorial with OSA, severe emphysema, and a suspected chronic PE involving the right middle lobe (group 3 and group 4 PH). Given the suspected mixed etiology with only 1 area of chronic thromboembolism (right middle lobe) as well as age, do not think that pulmonary thromboendarterectomy would be indicated.  Rheumatologic serologic workup was negative.  PFTs showed severe obstruction and moderate restriction, suggesting significant COPD. - Continue riociguat for CTEPH, 1.5 mg bid.  - c/w supp O2  - On  chronic 02 at home 4. AKI on Stage III CKD: Baseline SCr ~1.6. Scr 1.47 at d/c on 08/30. Up to 3.69 on admit -> 1.77 -> 1.07 today.  Likely AKI due to hypovolemia and RV failure with nausea/vomiting poor po intake and ongoing diuretic use + septic shock  5. Elevated troponin:  HS trop 247 > 499 > 717 > 893>102. No chest pain.   Likely due to demand ischemia from hypovolemia/low BP.  6. Transaminitis/shock liver from hypotension:  Improving. No significant abnormality identified on RUQ [place]. Small amount of perihepatic ascites. Ammonia level ok at 14 (9/6) 7. Ileus/partial SBO:  CT abdomen 04/19/21 with narrowing distal third duodenum with upstream dilatation. 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 Suspect ileus versus partial SBO, not certain this is due to the "nutcracker phenomenon" that has been there for a long time.  Has bowel sounds. No aspiration noted on swallow evaluation.  - has been seen by [facility]. Not surgical candidate.  - Currently tolerating a solid diet.   8. OSA: Untreated.  Insurance would apparently not cover home sleep study.  Need to address this as outpatient. Can he get home SS (wants this) versus lab sleep study? 9. Emphysema: Prior smoker.  Emphysema on CT and severe obstruction on PFTs - He uses home oxygen  10. Chronic PE: Continue apixaban. No further hemoptysis 11. BPH with urinary retention: He has had issues with this in the past, often self caths 3-4x/day. Concern for hygiene? Placed foley. - On Flomax.  - Had outpatient f/u scheduled but missed d/t admission 12. Hiccups: Have been intractable.   - On gabapentin.  - Continue  baclofen.   13. Hypokalemia/ Hypomagnesemia: Stable.  52. GOC  DNR/DNI   Ok to move out of ICU.    Amy Clegg NP-C  7:09 AM   Patient seen with NP, agree with the above note.    Doing better, off norepinephrine.  CVP 5.  Creatinine 1.07.  Walked in hall, tolerating  diet.  Hiccups better with baclofen.  Had emesis episode last night but also eating this morning and had BM.   General: NAD Neck: No JVD, no thyromegaly or thyroid nodule.  Lungs: Clear to auscultation bilaterally with normal respiratory effort. CV: Nondisplaced PMI.  Heart regular S1/S2, no S3/S4, no murmur.  No peripheral edema.   Abdomen: Soft, nontender, no hepatosplenomegaly, no distention.  Skin: Intact without lesions or rashes.  Neurologic: Alert and oriented x 3.  Psych: Normal affect. Extremities: No clubbing or cyanosis.  HEENT: Normal.   I think he can go to step-down.  Complete 7 day course of cefepime.  No diuretics today with low CVP.  Continue midodrine for low BP in setting of RV failure.  Continue to mobilize, hopefully can get home over the weekend.   Loralie Champagne 04/27/2021 7:44 AM

## 2021-04-27 NOTE — Progress Notes (Signed)
NAME:  Joshua [name], MRN:  [id number], DOB:  04/27/1936, LOS: 8 ADMISSION DATE:  04/19/2021, CONSULTATION DATE:  04/24/2021 REFERRING MD:  Dr. Aundra Dubin, CHIEF COMPLAINT:  Hypotension   History of Present Illness:  85 year old male with end stage RV failure on home midodrine, presents to ED on 9/1 with reported progressive nausea/vomiting for the last 48 hours. Recent discharge 8/30 for CHF exacerbation, started on Farxiga and midodrine On arrival to ED BP 61/41. HR 87. Temp 99.1. CXR with increased interstitial coarsening/mild edema. CT A/P with narrowing of narrowing third portion of duodenum between aneurysmal abdominal aorta or and SMV with upstream dilation suggestive of nutcracker phenomenon, small ventral abdominal hernia containing a loop of nonobstructed small bowel. Cardiology, General Surgery Consulted. Underwent upper GI series with no signs of significant duodenal narrowing/stricture. General Surgery Signed off. Throughout stay treated for CHF exacerbation, complicated by AKI and hypotension.   9/6 with hypotension with systolic 40-98J requiring transfer to ICU for vasopressors.   Pertinent  Medical History  Chronic Diastolic HF, Chronic RV failure, Hypotension on midodrine, COPD on 3L [place], BPH, CKD stage 3, PE on Eliquis, Self Caths 3 times per day    Significant Hospital Events: Including procedures, antibiotic start and stop dates in addition to other pertinent events   9/1 Presents to ED  9/6 Transferred to ICU for vasopressors   Interim History / Subjective:  Only complaint today is passing gas frequently.  He thinks this is associated with his known vanilla flavored protein shake  Objective   Blood pressure 108/65, pulse 85, temperature 98.1 F (36.7 C), temperature source Oral, resp. rate 14, height 6' 3" (1.905 m), weight 99.8 kg, SpO2 98 %. CVP:  [1 mmHg-21 mmHg] 3 mmHg      Intake/Output Summary (Last 24 hours) at 04/27/2021 1104 Last data filed at 04/27/2021 0400 Gross  per 24 hour  Intake 231.21 ml  Output 965 ml  Net -733.79 ml    Filed Weights   04/26/21 0500 04/26/21 0600 04/27/21 0600  Weight: 99.9 kg 99.9 kg 99.8 kg    Examination: General: Chronically ill-appearing elderly man sitting up in bed no acute distress HEENT: [place]/AT, eyes anicteric, oral mucosa moist Cardiac: S1-S2, regular rate and rhythm Resp: Breathing comfortably on nasal cannula, CTA B anteriorly, tachypnea without accessory muscle use Abd: Obese, soft, nontender, nondistended Extremities: Mild peripheral edema, TED hose in place Derm: Warm, dry, no rashes Neuro: Awake and alert, answering questions appropriately, moving all extremities spontaneously    Na + 132 BUN 26 Cr 1.07 Pct 0.56 9/6 resp cx> normal flora 9/6 blood> NGTD  Resolved Hospital Problem list     Assessment & Plan:  Acute on chronic shock probably related to aspiration, ileus in patient with little cardiopulmonary reserve improving -recent cultures negative, follow until finalized - Continue empiric cefepime to complete 7-day course - Continue midodrine 3 times daily -Okay to discontinue CVC upon leaving ICU   Group 2/3/4 pulmonary HTN on [place] -Needs outpatient sleep study - Maintain saturations greater than 90%. - Continue Eliquis for history of PE -Continue Adempas   Acute liver injury likely due to acute right heart failure - Continue to monitor periodically  COPD OSA History of lobar PE - Continue weaning supplemental oxygen as able - Needs outpatient sleep study -Continue Eliquis -Escalate bronchodilators to Anoro daily  -Atrovent as needed  Deconditioning - Continue mobility, PT and OT  Nausea, constipation -Continue Reglan, Zofran, bowel regimen  Moderate malnutrition -Protein shakes;  may need to consider a lactose-free option  Hyponatremia, persistent - Continue monitoring  Stable to transfer to med telemetry floor today.  We will remove CVC and Foley.  PCCM will  follow-up next week.  Transfer to [facility] tomorrow.  Daughter updated at bedside during rounds.  Best Practice (right click and "Reselect all SmartList Selections" daily)   Diet/type: cardiac DVT prophylaxis: DOAC GI prophylaxis: PPI Lines: Central line and No longer needed.  Order written to d/c  Foley:  Yes, and it is still needed Code Status:  DNR Last date of multidisciplinary goals of care discussion [ongoing]   Julian Hy, DO 04/27/21 11:13 AM [facility]

## 2021-04-27 NOTE — Progress Notes (Signed)
Mg 1.8 Replaced per protocol

## 2021-04-28 DIAGNOSIS — R778 Other specified abnormalities of plasma proteins: Secondary | ICD-10-CM

## 2021-04-28 DIAGNOSIS — N401 Enlarged prostate with lower urinary tract symptoms: Secondary | ICD-10-CM | POA: Diagnosis not present

## 2021-04-28 DIAGNOSIS — Q438 Other specified congenital malformations of intestine: Secondary | ICD-10-CM

## 2021-04-28 DIAGNOSIS — R7401 Elevation of levels of liver transaminase levels: Secondary | ICD-10-CM

## 2021-04-28 DIAGNOSIS — J9611 Chronic respiratory failure with hypoxia: Secondary | ICD-10-CM | POA: Diagnosis not present

## 2021-04-28 DIAGNOSIS — Z86711 Personal history of pulmonary embolism: Secondary | ICD-10-CM

## 2021-04-28 DIAGNOSIS — I50812 Chronic right heart failure: Secondary | ICD-10-CM | POA: Diagnosis not present

## 2021-04-28 DIAGNOSIS — N170 Acute kidney failure with tubular necrosis: Secondary | ICD-10-CM | POA: Diagnosis not present

## 2021-04-28 LAB — CBC
HCT: 44.3 % (ref 39.0–52.0)
Hemoglobin: 14.6 g/dL (ref 13.0–17.0)
MCH: 28.1 pg (ref 26.0–34.0)
MCHC: 33 g/dL (ref 30.0–36.0)
MCV: 85.2 fL (ref 80.0–100.0)
Platelets: 136 10*3/uL — ABNORMAL LOW (ref 150–400)
RBC: 5.2 MIL/uL (ref 4.22–5.81)
RDW: 15.6 % — ABNORMAL HIGH (ref 11.5–15.5)
WBC: 5.4 10*3/uL (ref 4.0–10.5)
nRBC: 0 % (ref 0.0–0.2)

## 2021-04-28 LAB — BASIC METABOLIC PANEL
Anion gap: 6 (ref 5–15)
BUN: 20 mg/dL (ref 8–23)
CO2: 30 mmol/L (ref 22–32)
Calcium: 9.5 mg/dL (ref 8.9–10.3)
Chloride: 97 mmol/L — ABNORMAL LOW (ref 98–111)
Creatinine, Ser: 0.9 mg/dL (ref 0.61–1.24)
GFR, Estimated: >60 mL/min (ref >60)
Glucose, Bld: 86 mg/dL (ref 70–99)
Potassium: 4.1 mmol/L (ref 3.5–5.1)
Sodium: 133 mmol/L — ABNORMAL LOW (ref 135–145)

## 2021-04-28 LAB — HEPATIC FUNCTION PANEL
ALT: 95 U/L — ABNORMAL HIGH (ref 0–44)
AST: 27 U/L (ref 15–41)
Albumin: 2.3 g/dL — ABNORMAL LOW (ref 3.5–5.0)
Alkaline Phosphatase: 61 U/L (ref 38–126)
Bilirubin, Direct: 0.4 mg/dL — ABNORMAL HIGH (ref 0.0–0.2)
Indirect Bilirubin: 0.8 mg/dL (ref 0.3–0.9)
Total Bilirubin: 1.2 mg/dL (ref 0.3–1.2)
Total Protein: 5.9 g/dL — ABNORMAL LOW (ref 6.5–8.1)

## 2021-04-28 LAB — COOXEMETRY PANEL
Carboxyhemoglobin: 2.2 % — ABNORMAL HIGH (ref 0.5–1.5)
Methemoglobin: 0.7 % (ref 0.0–1.5)
O2 Saturation: 87.7 %
Total hemoglobin: 14.7 g/dL (ref 12.0–16.0)

## 2021-04-28 LAB — MAGNESIUM: Magnesium: 1.8 mg/dL (ref 1.7–2.4)

## 2021-04-28 MED ORDER — CEFEPIME HCL 2 G IJ SOLR
2.0000 g | Freq: Three times a day (TID) | INTRAMUSCULAR | Status: DC
Start: 2021-04-28 — End: 2021-04-30
  Administered 2021-04-28 – 2021-04-30 (×7): 2 g via INTRAVENOUS
  Filled 2021-04-28 (×7): qty 2

## 2021-04-28 MED ORDER — POTASSIUM CHLORIDE CRYS ER 20 MEQ PO TBCR
20.0000 meq | EXTENDED_RELEASE_TABLET | Freq: Every day | ORAL | Status: DC
  Administered 2021-04-28 – 2021-04-29 (×2): 20 meq via ORAL
  Filled 2021-04-28 (×2): qty 1

## 2021-04-28 MED ORDER — MAGNESIUM SULFATE 2 GM/50ML IV SOLN
2.0000 g | Freq: Once | INTRAVENOUS | Status: AC
  Administered 2021-04-28: 2 g via INTRAVENOUS
  Filled 2021-04-28: qty 50

## 2021-04-28 MED ORDER — TORSEMIDE 20 MG PO TABS
60.0000 mg | ORAL_TABLET | Freq: Every day | ORAL | Status: DC
  Administered 2021-04-28: 60 mg via ORAL
  Filled 2021-04-28: qty 3

## 2021-04-28 NOTE — Progress Notes (Addendum)
PROGRESS NOTE    Joshua [name]  KJZ:[id number] DOB: 1936-08-17 DOA: 04/19/2021 PCP: [name], DO   Brief Narrative:  The patient is an 85 year old elderly overweight African-American male with end-stage right ventricular failure on home midodrine who presented to the ED on 04/19/2021 with reported progressive nausea and vomiting for 48 hours.  He recently seen and hospitalized and discharged on 04/17/2021 for CHF exacerbation at that time he started on Farxiga and midodrine.  On arrival to the ED his blood pressure was 61/41 and his heart rate was 87 his temperature of 99.1.  Chest x-ray showed interstitial coarsening mild edema that has been increased.  CT of the abdomen pelvis was done and showed narrowing third portion of the duodenum between the aneurysmal abdominal aorta or the SMV with upstream dilation suggestive of nutcracker phenomenon, small ventricle abdominal hernia containing a loop of nonobstructed bowel.  Cardiology, general surgery all consulted.  He underwent an upper GI series with no significant duodenal narrowing and stricturing.  General surgery signed off and diet was advanced.  Throughout the stay has been treated for CHF exacerbation that was complicated by AKI and hypotension.  On 04/24/2021 he had hypotension with systolic blood pressures in the 70s to 80s requiring transfer to the ICU for vasopressors.  Subsequently he was weaned off pressors and central line was removed and he was transferred to the [facility] service 04/28/2021.  He was admitted for acute on chronic shock likely related to aspiration in the setting of an ileus with patient with little cardiopulmonary reserve and was placed on empiric antibiotics to complete a 7-day course of cefepime.  Cardiology been following and given that his volume status is slightly overloaded they have started the patient back on diuretics.  He continues to be significantly deconditioned and PT OT will need to see the patient.  They are  recommending home health at discharge with supervision.  Palliative care is also seeing the patient for goals of care discussion  Assessment & Plan:   Principal Problem:   Intractable nausea and vomiting Active Problems:   History of pulmonary embolism   Chronic respiratory failure with hypoxia (HCC)   COPD (chronic obstructive pulmonary disease) (HCC)   BPH with urinary obstruction   Chronic right-sided heart failure (HCC)   Acute renal failure superimposed on stage 3 chronic kidney disease (HCC)   Hyperkalemia   Transaminitis   Elevated troponin   Duodenal anomaly  Acute on chronic shock probably related to aspiration, ileus in patient with little cardiopulmonary reserve improving -recent cultures negative, follow until finalized -Shock was likely a mixed picture between septic and cardiogenic based on his right ventricular failure -Initial thought was possible gut translocation with ileus and partial bowel small bowel obstruction -He remained stable off of norepinephrine vasopressors - Continue empiric cefepime to complete 7-day course and he has 2 more days of antibiotics after today to complete the course on 912 - Currently he is being continued on midodrine 15 mg p.o. 3 times daily with meals -His diuretics were held in the setting of his shock and hypotension and he was started on broad-spectrum antibiotics and now that he is improved cardiology is going to be restarting his diuretics of torsemide -Okay to discontinue CVC upon leaving ICU   Group 2/3/4 pulmonary HTN on [place] Chronic diastolic CHF/right ventricular failure -He has an echo of 55 to 60% based on last study in 222 and it showed an IV septum that was flattened and severe right ventricular  enlargement and severely decreased right ventricular function of the PSAP of 57 mm per mercury -Cardiology's been consulted for his end-stage right ventricular failure and diuretics were initially held due to his overdiuresis and  septic shock now that he is developed mild volume overload cardiology is restarted his torsemide and added TED hose along with his pulmonary hypertension -Needs outpatient sleep study - Maintain saturations greater than 90%. -Continue Eliquis for history of PE with 5 mg p.o. twice daily -Continue Riociguat 1.5 mg po TID -Continue with albuterol 2.5 mg every 4 as needed shortness of breath as well as an oral Ellipta 1 puff IH daily -Cardiology has resumed his torsemide at 60 mg p.o. daily -Cardiology evaluated and does not feel that the pulmonary thromboendarterectomy would be indicated at this time and his rheumatological serological work-up was negative - Can continue supplemental oxygen via nasal cannula and wean O2 as tolerated to home O2 -Repeat chest x-ray in the a.m. and will need an ambulatory home O2 screen -Yesterday patient ambulated in the hall with supervision with a rolling walker 3 L with stable vital signs   Acute liver injury likely due to acute right heart failure - Continue to monitor periodically -In the setting of hypotension -This has been improving -He had right upper quadrant ultrasound which showed no significant abnormality but he did have a small amount of perihepatic ascites -Normal ammonia level -Continue monitor and trend and repeat CMP in a.m.  Ileus and partial small bowel obstruction -On 04/20/1999 22-year narrowing distal third of the duodenum with upstream dilation with a "nutcracker phenomenon and abrupt narrowing of the third portion of the duodenum as it passed between the abdominal aorta and its SMV -The suspected ileus versus partial small bowel obstruction but they feel that this nutcracker phenomena has been there for a long time -General surgery evaluated and he is not a surgical candidate and is advanced to a solid diet and tolerating this without issues  AKI on CKD stage IIIa -Improved and patient is BUN/creatinine improved to 20/1.90 from  27/1.40 -His baseline creatinine appears to be around 1.3-1.6 -His AKI was likely in the setting of hypovolemia and right ventricular failure with his intractable nausea vomiting and poor p.o. intake as well as ongoing diuretic use and septic shock -Avoid further nephrotoxic medications, contrast dyes, hypotension renally dose medications -Repeat CMP in the a.m.   COPD OSA History of lobar PE - Continue weaning supplemental oxygen as able -Needs outpatient sleep study -Continue Eliquis -Escalate bronchodilators to Anoro daily  -Atrovent as needed -Pulmonary following and plans to check on him next week -Per cardiology apparently his insurance company will not approve will cover a home sleep study but this needs to be addressed in outpatient setting and he does have severe emphysema on CT and obstruction on PFTs -Continue supplemental oxygen via nasal cannula and continue apixaban for his chronic PE   Deconditioning - Continue mobility, PT and OT -OT recommending home health OT with intermittent supervision and PT evaluation are still pending   Nausea, constipation -Continue Reglan, Zofran, bowel regimen   Moderate malnutrition -Patient underwent a 48-hour calorie count and his appetite and p.o. intake is improved and he is taking Ensure and supplements and they are recommending continue regular diet in addition to other interventions including Ensure Enlive 3 times daily as well as multivitamin daily and Magic cup 3 times daily -Protein shakes; may need to consider a lactose-free option   Hyponatremia, persistent -mild and sodium is gone  from 132 is now 133 -Continue to monitor and trend and repeat CMP in the a.m.  BPH Acute Urinary Retention -Continue with finasteride 5 mg p.o. nightly -His doxazosin was held in the setting of hypotension -He has been retaining and had 3 in and out Cath's today. Discussed with Dr. Milford Cage of Urology who recommends replacing foley and following  up as an outpatient -He recommended Tamsulosin but patient has an Allergy   Delirium/insomnia -In the setting of ICU delirium -We will place on delirium precautions -Monitor very carefully  Goals of care -patient is limited code -Palliative care has been following  DVT prophylaxis: Anticoagulated with apixaban Code Status: Limited code Family Communication: Discussed with the daughter at bedside Disposition Plan: Pending further clinical improvement and arrangement of equipment at home prior to safe discharge disposition  Status is: Inpatient  Remains inpatient appropriate because:Unsafe d/c plan, IV treatments appropriate due to intensity of illness or inability to take PO, and Inpatient level of care appropriate due to severity of illness  Dispo:  Patient From: Home  Planned Disposition: Home with Health Care Svc  Medically stable for discharge: No     Consultants:  PCCM transfer Cardiology heart failure team General surgery Palliative care  Procedures:  Patient was transferred the ICU for vasopressors; central line is Foley catheter now removed  Antimicrobials:  Anti-infectives (From admission, onward)    Start     Dose/Rate Route Frequency Ordered Stop   04/28/21 1400  ceFEPIme (MAXIPIME) 2 g in sodium chloride 0.9 % 100 mL IVPB        2 g 200 mL/hr over 30 Minutes Intravenous Every 8 hours 04/28/21 0802     04/25/21 1100  vancomycin (VANCOREADY) IVPB 1250 mg/250 mL  Status:  Discontinued        1,250 mg 166.7 mL/hr over 90 Minutes Intravenous Every 24 hours 04/24/21 0944 04/25/21 0739   04/24/21 1030  ceFEPIme (MAXIPIME) 2 g in sodium chloride 0.9 % 100 mL IVPB  Status:  Discontinued        2 g 200 mL/hr over 30 Minutes Intravenous Every 12 hours 04/24/21 0942 04/28/21 0802   04/24/21 1030  vancomycin (VANCOREADY) IVPB 2000 mg/400 mL        2,000 mg 200 mL/hr over 120 Minutes Intravenous  Once 04/24/21 0942 04/24/21 1302   04/24/21 1000   piperacillin-tazobactam (ZOSYN) IVPB 3.375 g  Status:  Discontinued        3.375 g 12.5 mL/hr over 240 Minutes Intravenous Every 8 hours 04/24/21 0906 04/24/21 0942        Subjective: Seen and examined at bedside and patient's daughter states that he had a very rough night and was up most of the night and 5 fell asleep.  He was not awake enough for me to ask him questions given that he is resting asleep currently.  Most of the history was obtained from the patient's daughter at bedside and she is requesting a hospital bed at discharge.  No other concerns or complaints at this time and he ambulated well with therapy yesterday.  Objective: Vitals:   04/28/21 1400 04/28/21 1500 04/28/21 1600 04/28/21 1619  BP: 93/70 93/61 125/74   Pulse: 82 74 79   Resp: _0 Temp:    98.7 F (37.1 C)  TempSrc:    Oral  SpO2: 97% 98% 92%   Weight:      Height:        Intake/Output Summary (Last 24 hours)  at 04/28/2021 1704 Last data filed at 04/28/2021 1534 Gross per 24 hour  Intake 433 ml  Output 2175 ml  Net -1742 ml   Filed Weights   04/26/21 0500 04/26/21 0600 04/27/21 0600  Weight: 99.9 kg 99.9 kg 99.8 kg   Examination: Physical Exam:  Constitutional: WN/WD overweight chronically ill-appearing African-American male currently in NAD and appears calm and comfortable sleeping in the chair bedside Eyes: PERRL, lids and conjunctivae normal, sclerae anicteric  ENMT: External Ears, Nose appear normal. Grossly normal hearing.  Neck: Appears normal, supple, no cervical masses, normal ROM, no appreciable thyromegaly neck: Mild JVD Respiratory: Diminished to auscultation bilaterally with coarse breath sounds, no wheezing, rales, rhonchi or crackles. Normal respiratory effort and patient is not tachypenic. No accessory muscle use.  Unlabored breathing wearing supplemental oxygen via nasal cannula Cardiovascular: RRR, no murmurs / rubs / gallops. S1 and S2 auscultated.  Is 1+ lower extremity  edema Abdomen: Soft, non-tender, distended secondary body habitus. Bowel sounds positive.  GU: Deferred. Musculoskeletal: No clubbing / cyanosis of digits/nails. No joint deformity upper and lower extremities. Skin: No rashes, lesions, ulcers. No induration; Warm and dry.  Neurologic: Patient is very somnolent and drowsy and asleep so does not participate in neurological examination Psychiatric: Impaired judgment and insight.  He is somnolent and drowsy and is asleep currently  Data Reviewed: I have personally reviewed following labs and imaging studies  CBC: Recent Labs  Lab 04/24/21 1121 04/25/21 0440 04/26/21 0446 04/27/21 0113 04/28/21 0100  WBC 14.0* 12.1* 8.0 5.5 5.4  HGB 14.9 15.5 14.9 14.8 14.6  HCT 45.2 47.8 45.4 45.6 44.3  MCV 86.8 88.5 86.1 85.6 85.2  PLT 102* 108* 120* 106* 419*   Basic Metabolic Panel: Recent Labs  Lab 04/25/21 0440 04/25/21 1353 04/25/21 2006 04/26/21 0446 04/27/21 0113 04/28/21 0100  NA 137 133*  --  132* 132* 133*  K 3.0* 3.9  --  3.6 4.6 4.1  CL 92* 91*  --  87* 95* 97*  CO2 33* 35*  --  37* 28 30  GLUCOSE 109* 140*  --  107* 65* 86  BUN 27* 33*  --  29* 26* 20  CREATININE 1.40* 1.47*  --  1.08 1.07 0.90  CALCIUM 8.5* 8.9  --  9.4 9.5 9.5  MG 1.1*  --  2.0 1.8 1.8 1.8  PHOS 2.9  --   --   --   --   --    GFR: Estimated Creatinine Clearance: 74.3 mL/min (by C-G formula based on SCr of 0.9 mg/dL). Liver Function Tests: Recent Labs  Lab 04/22/21 0100 04/23/21 0016 04/24/21 0027 04/25/21 0440 04/28/21 0110  AST 558* 347* 173* 56* 27  ALT 703* 584* 459* 226* 95*  ALKPHOS 92 86 90 71 61  BILITOT 2.6* 2.5* 2.2* 1.9* 1.2  PROT 6.2* 6.4* 6.8 5.6* 5.9*  ALBUMIN 2.8* 2.9* 3.1* 2.4* 2.3*   No results for input(s): LIPASE, AMYLASE in the last 168 hours. Recent Labs  Lab 04/24/21 1121  AMMONIA 14   Coagulation Profile: No results for input(s): INR, PROTIME in the last 168 hours. Cardiac Enzymes: No results for input(s):  CKTOTAL, CKMB, CKMBINDEX, TROPONINI in the last 168 hours. BNP (last 3 results) No results for input(s): PROBNP in the last 8760 hours. HbA1C: No results for input(s): HGBA1C in the last 72 hours. CBG: Recent Labs  Lab 04/24/21 0856  GLUCAP 122*   Lipid Profile: No results for input(s): CHOL, HDL, LDLCALC, TRIG, CHOLHDL, LDLDIRECT in  the last 72 hours. Thyroid Function Tests: No results for input(s): TSH, T4TOTAL, FREET4, T3FREE, THYROIDAB in the last 72 hours. Anemia Panel: No results for input(s): VITAMINB12, FOLATE, FERRITIN, TIBC, IRON, RETICCTPCT in the last 72 hours. Sepsis Labs: Recent Labs  Lab 04/24/21 0850 04/24/21 1121 04/24/21 1323 04/25/21 0440 04/26/21 0446  PROCALCITON 1.04  --   --  0.77 0.56  LATICACIDVEN  --  1.0 0.9  --   --     Recent Results (from the past 240 hour(s))  Resp Panel by RT-PCR (Flu A&B, Covid) Nasopharyngeal Swab     Status: None   Collection Time: 04/19/21  9:34 AM   Specimen: Nasopharyngeal Swab; Nasopharyngeal(NP) swabs in vial transport medium  Result Value Ref Range Status   SARS Coronavirus 2 by RT PCR NEGATIVE NEGATIVE Final    Comment: (NOTE) SARS-CoV-2 target nucleic acids are NOT DETECTED.  The SARS-CoV-2 RNA is generally detectable in upper respiratory specimens during the acute phase of infection. The lowest concentration of SARS-CoV-2 viral copies this assay can detect is 138 copies/mL. A negative result does not preclude SARS-Cov-2 infection and should not be used as the sole basis for treatment or other patient management decisions. A negative result may occur with  improper specimen collection/handling, submission of specimen other than nasopharyngeal swab, presence of viral mutation(s) within the areas targeted by this assay, and inadequate number of viral copies(<138 copies/mL). A negative result must be combined with clinical observations, patient history, and epidemiological information. The expected result is  Negative.  Fact Sheet for Patients:  EntrepreneurPulse.com.au  Fact Sheet for Healthcare Providers:  IncredibleEmployment.be  This test is no t yet approved or cleared by the [place] FDA and  has been authorized for detection and/or diagnosis of SARS-CoV-2 by FDA under an Emergency Use Authorization (EUA). This EUA will remain  in effect (meaning this test can be used) for the duration of the COVID-19 declaration under Section 564(b)(1) of the Act, 21 U.S.C.section 360bbb-3(b)(1), unless the authorization is terminated  or revoked sooner.       Influenza A by PCR NEGATIVE NEGATIVE Final   Influenza B by PCR NEGATIVE NEGATIVE Final    Comment: (NOTE) The Xpert Xpress SARS-CoV-2/FLU/RSV plus assay is intended as an aid in the diagnosis of influenza from Nasopharyngeal swab specimens and should not be used as a sole basis for treatment. Nasal washings and aspirates are unacceptable for Xpert Xpress SARS-CoV-2/FLU/RSV testing.  Fact Sheet for Patients: EntrepreneurPulse.com.au  Fact Sheet for Healthcare Providers: IncredibleEmployment.be  This test is not yet approved or cleared by the [place] FDA and has been authorized for detection and/or diagnosis of SARS-CoV-2 by FDA under an Emergency Use Authorization (EUA). This EUA will remain in effect (meaning this test can be used) for the duration of the COVID-19 declaration under Section 564(b)(1) of the Act, 21 U.S.C. section 360bbb-3(b)(1), unless the authorization is terminated or revoked.  Performed at [facility], [place] [address], [place], [place] [address]   Culture, blood (routine x 2)     Status: None   Collection Time: 04/20/21 11:50 AM   Specimen: BLOOD RIGHT ARM  Result Value Ref Range Status   Specimen Description BLOOD RIGHT ARM  Final   Special Requests   Final    BOTTLES DRAWN AEROBIC AND ANAEROBIC Blood Culture adequate  volume   Culture   Final    NO GROWTH 5 DAYS Performed at [facility], [address], [place], [place] [address]  Report Status 04/25/2021 FINAL  Final  Culture, blood (routine x 2)     Status: None   Collection Time: 04/20/21 11:56 AM   Specimen: BLOOD LEFT HAND  Result Value Ref Range Status   Specimen Description BLOOD LEFT HAND  Final   Special Requests   Final    BOTTLES DRAWN AEROBIC ONLY Blood Culture results may not be optimal due to an inadequate volume of blood received in culture bottles   Culture   Final    NO GROWTH 5 DAYS Performed at [facility], [place] [address] [place], [place] [address]    Report Status 04/25/2021 FINAL  Final  Culture, blood (routine x 2)     Status: None (Preliminary result)   Collection Time: 04/24/21  8:58 AM   Specimen: BLOOD LEFT HAND  Result Value Ref Range Status   Specimen Description BLOOD LEFT HAND  Final   Special Requests   Final    BOTTLES DRAWN AEROBIC AND ANAEROBIC Blood Culture adequate volume   Culture   Final    NO GROWTH 4 DAYS Performed at [facility], [place] [address] [place], [place] [address]    Report Status PENDING  Incomplete  Culture, blood (routine x 2)     Status: None (Preliminary result)   Collection Time: 04/24/21  8:58 AM   Specimen: BLOOD LEFT FOREARM  Result Value Ref Range Status   Specimen Description BLOOD LEFT FOREARM  Final   Special Requests   Final    BOTTLES DRAWN AEROBIC AND ANAEROBIC Blood Culture adequate volume   Culture   Final    NO GROWTH 4 DAYS Performed at [facility], [place] [address] [place], [place] [address]    Report Status PENDING  Incomplete  Expectorated Sputum Assessment w Gram Stain, Rflx to Resp Cult     Status: None   Collection Time: 04/24/21 11:01 AM   Specimen: Expectorated Sputum  Result Value Ref Range Status   Specimen Description EXPECTORATED SPUTUM  Final   Special Requests NONE  Final   Sputum evaluation   Final    THIS SPECIMEN IS  ACCEPTABLE FOR SPUTUM CULTURE Performed at [facility], [place] [address], [place], [place] [address]    Report Status 04/24/2021 FINAL  Final  Culture, Respiratory w Gram Stain     Status: None   Collection Time: 04/24/21 11:01 AM  Result Value Ref Range Status   Specimen Description EXPECTORATED SPUTUM  Final   Special Requests NONE Reflexed from [id number]  Final   Gram Stain   Final    ABUNDANT WBC PRESENT,BOTH PMN AND MONONUCLEAR FEW SQUAMOUS EPITHELIAL CELLS PRESENT FEW GRAM POSITIVE COCCI RARE GRAM VARIABLE ROD RARE YEAST    Culture   Final    Normal respiratory flora-no Staph aureus or Pseudomonas seen Performed at [facility], [place] [address], [place],  [address]    Report Status 04/26/2021 FINAL  Final  MRSA Next Gen by PCR, Nasal     Status: None   Collection Time: 04/24/21  2:00 PM   Specimen: Nasal Mucosa; Nasal Swab  Result Value Ref Range Status   MRSA by PCR Next Gen NOT DETECTED NOT DETECTED Final    Comment: (NOTE) The GeneXpert MRSA Assay (FDA approved for NASAL specimens only), is one component of a comprehensive MRSA colonization surveillance program. It is not intended to diagnose MRSA infection nor to guide or monitor treatment for MRSA infections. Test performance is  not FDA approved in patients less than 63 years old. Performed at [facility], [place] [address] [place], [place] [address]     RN Pressure Injury Documentation:     Estimated body mass index is 27.5 kg/m as calculated from the following:   Height as of this encounter: _0  (1.905 m).   Weight as of this encounter: 99.8 kg.  Malnutrition Type:  Nutrition Problem: Inadequate oral intake Etiology: decreased appetite  Malnutrition Characteristics:  Signs/Symptoms: per patient/family report  Nutrition Interventions:  Interventions: Ensure Enlive (each supplement provides 350kcal and 20 grams of protein), MVI, Magic cup   Radiology Studies: No results  found.  Scheduled Meds:  allopurinol  100 mg Oral q morning   apixaban  5 mg Oral BID   bisacodyl  10 mg Rectal Daily   Chlorhexidine Gluconate Cloth  6 each Topical Daily   feeding supplement  237 mL Oral TID BM   finasteride  5 mg Oral QHS   gabapentin  200 mg Oral TID   metoCLOPramide (REGLAN) injection  10 mg Intravenous Q8H   midodrine  15 mg Oral TID WC   multivitamin with minerals  1 tablet Oral Daily   pantoprazole  40 mg Oral q morning   polyethylene glycol  17 g Oral Daily   potassium chloride  20 mEq Oral Daily   Riociguat  1.5 mg Oral TID   senna-docusate  1 tablet Oral Daily   sodium chloride flush  3 mL Intravenous Q12H   sorbitol  60 mL Oral Q0600   torsemide  60 mg Oral Daily   umeclidinium-vilanterol  1 puff Inhalation Daily   Continuous Infusions:  sodium chloride Stopped (04/28/21 1433)   ceFEPime (MAXIPIME) IV 200 mL/hr at 04/28/21 1500    LOS: 9 days   Kerney Elbe, DO [facility] PAGER is on AMION  If 7PM-7AM, please contact night-coverage [email or web address]

## 2021-04-28 NOTE — Progress Notes (Signed)
Patient ID: Joshua [name], male   DOB: November 10, 1935, 85 y.o.   MRN: [id number]     Advanced Heart Failure Rounding Note  PCP-Cardiologist: Sherren Mocha, MD  HF: Dr. Aundra Dubin  Subjective:    Patient admitted 09/01 with N, V, hypotension and AKI on CKD after recent admission for a/c diastolic HF/RV failure. Gave IV fluids and Midodrine increased to 15 mg TID to support BP.  Transferred to CCU 9/6 for lethargy, fever and hypotension w/ concern for developing septic shock. Started on broad spectrum antibiotics. Now afebrile, WBCs normal. Cefepime ongoing.   Now off Norepinephrine, remains on midodrine.  Has not had diuretics for several days.  Weight stable.   Now DNR/DNI w/ palliative care team following.   Denies SOB. Wants to walk more. Remains on 4L [place].    Objective:   Weight Range: 99.8 kg Body mass index is 27.5 kg/m.   Vital Signs:   Temp:  [97.9 F (36.6 C)-98.8 F (37.1 C)] 98.1 F (36.7 C) (09/10 0400) Pulse Rate:  [78-100] 84 (09/10 0600) Resp:  [11-24] 16 (09/10 0600) BP: (86-124)/(57-86) 91/61 (09/10 0600) SpO2:  [90 %-100 %] 93 % (09/10 0600) Last BM Date: 04/27/21  Weight change: Filed Weights   04/26/21 0500 04/26/21 0600 04/27/21 0600  Weight: 99.9 kg 99.9 kg 99.8 kg    Intake/Output:   Intake/Output Summary (Last 24 hours) at 04/28/2021 0746 Last data filed at 04/28/2021 0000 Gross per 24 hour  Intake 295.18 ml  Output 2004 ml  Net -1708.82 ml      Physical Exam    General: NAD Neck: JVP 8-9 cm, no thyromegaly or thyroid nodule.  Lungs: Clear to auscultation bilaterally with normal respiratory effort. CV: Nondisplaced PMI.  Heart regular S1/S2, no S3/S4, no murmur.  1+ ankle edema.  Abdomen: Soft, nontender, no hepatosplenomegaly, no distention.  Skin: Intact without lesions or rashes.  Neurologic: Alert and oriented x 3.  Psych: Normal affect. Extremities: No clubbing or cyanosis.  HEENT: Normal.    Telemetry  SR 80-90s   Labs     CBC Recent Labs    04/27/21 0113 04/28/21 0100  WBC 5.5 5.4  HGB 14.8 14.6  HCT 45.6 44.3  MCV 85.6 85.2  PLT 106* 136*    Basic Metabolic Panel Recent Labs    04/27/21 0113 04/28/21 0100  NA 132* 133*  K 4.6 4.1  CL 95* 97*  CO2 28 30  GLUCOSE 65* 86  BUN 26* 20  CREATININE 1.07 0.90  CALCIUM 9.5 9.5  MG 1.8 1.8   Liver Function Tests No results for input(s): AST, ALT, ALKPHOS, BILITOT, PROT, ALBUMIN in the last 72 hours.  No results for input(s): LIPASE, AMYLASE in the last 72 hours.  Cardiac Enzymes No results for input(s): CKTOTAL, CKMB, CKMBINDEX, TROPONINI in the last 72 hours.  BNP: BNP (last 3 results) Recent Labs    04/12/21 1435 04/19/21 0915 04/22/21 0958  BNP 1,597.1* 2,618.0* 1,156.1*    ProBNP (last 3 results) No results for input(s): PROBNP in the last 8760 hours.   D-Dimer No results for input(s): DDIMER in the last 72 hours. Hemoglobin A1C No results for input(s): HGBA1C in the last 72 hours. Fasting Lipid Panel No results for input(s): CHOL, HDL, LDLCALC, TRIG, CHOLHDL, LDLDIRECT in the last 72 hours. Thyroid Function Tests No results for input(s): TSH, T4TOTAL, T3FREE, THYROIDAB in the last 72 hours.  Invalid input(s): FREET3  Other results:   Imaging    No results found.  Medications:     Scheduled Medications:  allopurinol  100 mg Oral q morning   apixaban  5 mg Oral BID   bisacodyl  10 mg Rectal Daily   Chlorhexidine Gluconate Cloth  6 each Topical Daily   feeding supplement  237 mL Oral TID BM   finasteride  5 mg Oral QHS   gabapentin  200 mg Oral TID   metoCLOPramide (REGLAN) injection  10 mg Intravenous Q8H   midodrine  15 mg Oral TID WC   multivitamin with minerals  1 tablet Oral Daily   pantoprazole  40 mg Oral q morning   polyethylene glycol  17 g Oral Daily   potassium chloride  20 mEq Oral Daily   Riociguat  1.5 mg Oral TID   senna-docusate  1 tablet Oral Daily   sodium chloride flush  3 mL  Intravenous Q12H   sorbitol  60 mL Oral Q0600   torsemide  60 mg Oral Daily   umeclidinium-vilanterol  1 puff Inhalation Daily    Infusions:  sodium chloride     ceFEPime (MAXIPIME) IV 200 mL/hr at 04/27/21 2200    PRN Medications: sodium chloride, albuterol, baclofen, calcium carbonate, ipratropium, metoCLOPramide (REGLAN) injection, ondansetron **OR** ondansetron (ZOFRAN) IV, sodium chloride flush   Assessment/Plan  1.  Shock: Suspect mixed septic and cardiogenic (baseline RV failure).  Suspect possible gut translocation with ileus/partial SBO. Cultures negative, afebrile with normal WBCs.  - Continue cefepime until 04/30/21  - Stable off norepinephrine.   - Continue midodrine 15 mg tid.  2. Chronic HFpEF/RV failure: Echo (2/22) with EF 55-60%, IV septum flattened, severe RV enlargement, severely decreased RV function, PASP 57 mmHg.  He has end-stage RV failure.  Diuretics initially held this admission with over-diuresis and septic shock.  Now suspect he is developing mild volume overload. - Restart torsemide at 60 mg daily.  - Continue Midodrine 15 mg TID - TED hose 3. Pulmonary HTN: PAH noted on 4/22 RHC with PVR 6.1 WU.  This appears to be multifactorial with OSA, severe emphysema, and a suspected chronic PE involving the right middle lobe (group 3 and group 4 PH). Given the suspected mixed etiology with only 1 area of chronic thromboembolism (right middle lobe) as well as age, do not think that pulmonary thromboendarterectomy would be indicated.  Rheumatologic serologic workup was negative.  PFTs showed severe obstruction and moderate restriction, suggesting significant COPD. - Continue riociguat for CTEPH, 1.5 mg bid.  - c/w supp O2, on chronic 02 at home 4. AKI on Stage III CKD: Baseline SCr ~1.6. Scr 1.47 at d/c on 08/30. Up to 3.69 on admit -> 1.77 -> 1.07 -> 0.9 today.  Likely AKI due to hypovolemia and RV failure with nausea/vomiting poor po intake and ongoing diuretic use +  septic shock  5. Elevated troponin:  HS trop 247 > 499 > 717 > 893>102. No chest pain.   Likely due to demand ischemia from hypovolemia/low BP.  6. Transaminitis/shock liver from hypotension: Improving. No significant abnormality identified on RUQ [place]. Small amount of perihepatic ascites. Ammonia level ok at 14 (9/6) 7. Ileus/partial SBO:  CT abdomen 04/19/21 with narrowing distal third duodenum with upstream dilatation. 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 Suspect ileus versus partial SBO, not certain this is due to the "nutcracker phenomenon" that has been there for a long time.  Has bowel sounds. No aspiration noted on swallow evaluation.  - has been  seen by [facility]. Not surgical candidate.  - Currently tolerating a solid diet.   8. OSA: Untreated.  Insurance would apparently not cover home sleep study.  Need to address this as outpatient. Can he get home SS (wants this) versus lab sleep study? 9. Emphysema: Prior smoker.  Emphysema on CT and severe obstruction on PFTs - He uses home oxygen  10. Chronic PE: Continue apixaban. No further hemoptysis 11. BPH with urinary retention: He has had issues with this in the past, often self caths 3-4x/day. Concern for hygiene? Placed foley. - On Flomax.  - Had outpatient f/u scheduled but missed d/t admission 12. Hiccups: Have been intractable.   - On gabapentin.  - Continue  baclofen.   13. Hypokalemia/ Hypomagnesemia: Stable.  46. GOC   DNR/DNI   Ok to move out of ICU. Should be ready for home soon.   Loralie Champagne 04/28/2021 7:46 AM

## 2021-04-28 NOTE — Progress Notes (Signed)
Pharmacy Antibiotic Note  Joshua [name] is a 85 y.o. male admitted on 04/19/2021 with sepsis.  Pharmacy has been consulted for cefepime dosing. Plans fora 7 days course with end date 9/12.   Scr down to 0.9. Afebrile, cultures- ngtd. Concern for aspiration PNA    Plan: Change Cefepime to 2g IV every 8 hours Monitor renal fx, cx results, clinical pic   Height: _0  (190.5 cm) Weight: 99.8 kg (220 lb 0.3 oz) IBW/kg (Calculated) : 84.5  Temp (24hrs), Avg:98.2 F (36.8 C), Min:97.9 F (36.6 C), Max:98.8 F (37.1 C)  Recent Labs  Lab 04/24/21 1121 04/24/21 1323 04/25/21 0440 04/25/21 1353 04/26/21 0446 04/27/21 0113 04/28/21 0100  WBC 14.0*  --  12.1*  --  8.0 5.5 5.4  CREATININE  --   --  1.40* 1.47* 1.08 1.07 0.90  LATICACIDVEN 1.0 0.9  --   --   --   --   --      Estimated Creatinine Clearance: 74.3 mL/min (by C-G formula based on SCr of 0.9 mg/dL).    Allergies  Allergen Reactions   Other Swelling    Beer- Swelling    Sunflower Oil Swelling   Sulfa Antibiotics Rash    Antimicrobials this admission: Cefepime 9/6 >> Vancomycin 9/6>> 9/6  Dose adjustments this admission: N/A  Microbiology results: 9/2 BCx: ngtd  9/6 Bcx: ngtd 9/6 COVID PCR: neg    Thank you for allowing pharmacy to be a part of this patient's care.  Hildred Laser, PharmD Clinical Pharmacist **Pharmacist phone directory can now be found on [email or web address] ([email or web address]).  Listed under [place].

## 2021-04-29 DIAGNOSIS — N401 Enlarged prostate with lower urinary tract symptoms: Secondary | ICD-10-CM | POA: Diagnosis not present

## 2021-04-29 DIAGNOSIS — R112 Nausea with vomiting, unspecified: Secondary | ICD-10-CM | POA: Diagnosis not present

## 2021-04-29 DIAGNOSIS — I50812 Chronic right heart failure: Secondary | ICD-10-CM | POA: Diagnosis not present

## 2021-04-29 DIAGNOSIS — J9611 Chronic respiratory failure with hypoxia: Secondary | ICD-10-CM | POA: Diagnosis not present

## 2021-04-29 DIAGNOSIS — N170 Acute kidney failure with tubular necrosis: Secondary | ICD-10-CM | POA: Diagnosis not present

## 2021-04-29 LAB — COMPREHENSIVE METABOLIC PANEL
ALT: 78 U/L — ABNORMAL HIGH (ref 0–44)
AST: 30 U/L (ref 15–41)
Albumin: 2.4 g/dL — ABNORMAL LOW (ref 3.5–5.0)
Alkaline Phosphatase: 66 U/L (ref 38–126)
Anion gap: 5 (ref 5–15)
BUN: 22 mg/dL (ref 8–23)
CO2: 34 mmol/L — ABNORMAL HIGH (ref 22–32)
Calcium: 9.5 mg/dL (ref 8.9–10.3)
Chloride: 96 mmol/L — ABNORMAL LOW (ref 98–111)
Creatinine, Ser: 1.02 mg/dL (ref 0.61–1.24)
GFR, Estimated: >60 mL/min (ref >60)
Glucose, Bld: 100 mg/dL — ABNORMAL HIGH (ref 70–99)
Potassium: 4.1 mmol/L (ref 3.5–5.1)
Sodium: 135 mmol/L (ref 135–145)
Total Bilirubin: 1 mg/dL (ref 0.3–1.2)
Total Protein: 6.2 g/dL — ABNORMAL LOW (ref 6.5–8.1)

## 2021-04-29 LAB — CULTURE, BLOOD (ROUTINE X 2)
Culture: NO GROWTH
Culture: NO GROWTH
Special Requests: ADEQUATE
Special Requests: ADEQUATE

## 2021-04-29 LAB — CBC WITH DIFFERENTIAL/PLATELET
Abs Immature Granulocytes: 0.03 10*3/uL (ref 0.00–0.07)
Basophils Absolute: 0 10*3/uL (ref 0.0–0.1)
Basophils Relative: 0 %
Eosinophils Absolute: 0.3 10*3/uL (ref 0.0–0.5)
Eosinophils Relative: 5 %
HCT: 44.2 % (ref 39.0–52.0)
Hemoglobin: 14.5 g/dL (ref 13.0–17.0)
Immature Granulocytes: 0 %
Lymphocytes Relative: 10 %
Lymphs Abs: 0.7 10*3/uL (ref 0.7–4.0)
MCH: 28 pg (ref 26.0–34.0)
MCHC: 32.8 g/dL (ref 30.0–36.0)
MCV: 85.5 fL (ref 80.0–100.0)
Monocytes Absolute: 0.8 10*3/uL (ref 0.1–1.0)
Monocytes Relative: 12 %
Neutro Abs: 5.2 10*3/uL (ref 1.7–7.7)
Neutrophils Relative %: 73 %
Platelets: 176 10*3/uL (ref 150–400)
RBC: 5.17 MIL/uL (ref 4.22–5.81)
RDW: 15.9 % — ABNORMAL HIGH (ref 11.5–15.5)
WBC: 7.1 10*3/uL (ref 4.0–10.5)
nRBC: 0 % (ref 0.0–0.2)

## 2021-04-29 LAB — COOXEMETRY PANEL
Carboxyhemoglobin: 1.7 % — ABNORMAL HIGH (ref 0.5–1.5)
Methemoglobin: 0.7 % (ref 0.0–1.5)
O2 Saturation: 71.4 %
Total hemoglobin: 15 g/dL (ref 12.0–16.0)

## 2021-04-29 LAB — MAGNESIUM: Magnesium: 1.9 mg/dL (ref 1.7–2.4)

## 2021-04-29 LAB — PHOSPHORUS: Phosphorus: 2.4 mg/dL — ABNORMAL LOW (ref 2.5–4.6)

## 2021-04-29 MED ORDER — TORSEMIDE 20 MG PO TABS
40.0000 mg | ORAL_TABLET | Freq: Every day | ORAL | Status: DC
  Administered 2021-04-29: 40 mg via ORAL
  Filled 2021-04-29: qty 2

## 2021-04-29 MED ORDER — K PHOS MONO-SOD PHOS DI & MONO 155-852-130 MG PO TABS
500.0000 mg | ORAL_TABLET | Freq: Once | ORAL | Status: AC
  Administered 2021-04-29: 500 mg via ORAL
  Filled 2021-04-29: qty 2

## 2021-04-29 MED ORDER — ACETAMINOPHEN 160 MG/5ML PO SOLN: 650.0000 mg | Freq: Four times a day (QID) | ORAL | Status: DC | PRN

## 2021-04-29 NOTE — Progress Notes (Signed)
Patient ID: Joshua [name], male   DOB: Apr 09, 1936, 85 y.o.   MRN: [id number]     Advanced Heart Failure Rounding Note  PCP-Cardiologist: Sherren Mocha, MD  HF: Dr. Aundra Dubin  Subjective:    Patient admitted 09/01 with N, V, hypotension and AKI on CKD after recent admission for a/c diastolic HF/RV failure. Gave IV fluids and Midodrine increased to 15 mg TID to support BP.  Transferred to CCU 9/6 for lethargy, fever and hypotension w/ concern for developing septic shock. Started on broad spectrum antibiotics. Now afebrile, WBCs normal. Cefepime ongoing.   Now off Norepinephrine, remains on midodrine.  BP still runs relatively low, creatinine stable 1.02. Started back on torsemide yesterday with vigorous diuresis. I/Os net negative 3500 cc.    Now DNR/DNI w/ palliative care team following.   Denies SOB. Wants to walk more. Remains on 4L [place].    Objective:   Weight Range: 99.8 kg Body mass index is 27.5 kg/m.   Vital Signs:   Temp:  [97.8 F (36.6 C)-98.7 F (37.1 C)] 98.6 F (37 C) (09/10 1945) Pulse Rate:  [62-94] 77 (09/11 0600) Resp:  [10-21] 10 (09/11 0600) BP: (75-125)/(47-92) 75/54 (09/11 0600) SpO2:  [90 %-100 %] 93 % (09/11 0600) Last BM Date: 04/27/21  Weight change: Filed Weights   04/26/21 0500 04/26/21 0600 04/27/21 0600  Weight: 99.9 kg 99.9 kg 99.8 kg    Intake/Output:   Intake/Output Summary (Last 24 hours) at 04/29/2021 0737 Last data filed at 04/29/2021 0600 Gross per 24 hour  Intake 646.13 ml  Output 4150 ml  Net -3503.87 ml      Physical Exam    General: NAD Neck: No JVD, no thyromegaly or thyroid nodule.  Lungs: Clear to auscultation bilaterally with normal respiratory effort. CV: Nondisplaced PMI.  Heart regular S1/S2, no S3/S4, no murmur.  1+ ankle edema.  No carotid bruit.  Normal pedal pulses.  Abdomen: Soft, nontender, no hepatosplenomegaly, no distention.  Skin: Intact without lesions or rashes.  Neurologic: Alert and oriented x 3.   Psych: Normal affect. Extremities: No clubbing or cyanosis.  HEENT: Normal.   Telemetry  SR 80-90s   Labs    CBC Recent Labs    04/28/21 0100 04/29/21 0147  WBC 5.4 7.1  NEUTROABS  --  5.2  HGB 14.6 14.5  HCT 44.3 44.2  MCV 85.2 85.5  PLT 136* 831    Basic Metabolic Panel Recent Labs    04/28/21 0100 04/29/21 0147  NA 133* 135  K 4.1 4.1  CL 97* 96*  CO2 30 34*  GLUCOSE 86 100*  BUN 20 22  CREATININE 0.90 1.02  CALCIUM 9.5 9.5  MG 1.8 1.9  PHOS  --  2.4*   Liver Function Tests Recent Labs    04/28/21 0110 04/29/21 0147  AST 27 30  ALT 95* 78*  ALKPHOS 61 66  BILITOT 1.2 1.0  PROT 5.9* 6.2*  ALBUMIN 2.3* 2.4*    No results for input(s): LIPASE, AMYLASE in the last 72 hours.  Cardiac Enzymes No results for input(s): CKTOTAL, CKMB, CKMBINDEX, TROPONINI in the last 72 hours.  BNP: BNP (last 3 results) Recent Labs    04/12/21 1435 04/19/21 0915 04/22/21 0958  BNP 1,597.1* 2,618.0* 1,156.1*    ProBNP (last 3 results) No results for input(s): PROBNP in the last 8760 hours.   D-Dimer No results for input(s): DDIMER in the last 72 hours. Hemoglobin A1C No results for input(s): HGBA1C in the last 72 hours.  Fasting Lipid Panel No results for input(s): CHOL, HDL, LDLCALC, TRIG, CHOLHDL, LDLDIRECT in the last 72 hours. Thyroid Function Tests No results for input(s): TSH, T4TOTAL, T3FREE, THYROIDAB in the last 72 hours.  Invalid input(s): FREET3  Other results:   Imaging    No results found.   Medications:     Scheduled Medications:  allopurinol  100 mg Oral q morning   apixaban  5 mg Oral BID   bisacodyl  10 mg Rectal Daily   Chlorhexidine Gluconate Cloth  6 each Topical Daily   feeding supplement  237 mL Oral TID BM   finasteride  5 mg Oral QHS   gabapentin  200 mg Oral TID   metoCLOPramide (REGLAN) injection  10 mg Intravenous Q8H   midodrine  15 mg Oral TID WC   multivitamin with minerals  1 tablet Oral Daily    pantoprazole  40 mg Oral q morning   polyethylene glycol  17 g Oral Daily   potassium chloride  20 mEq Oral Daily   Riociguat  1.5 mg Oral TID   senna-docusate  1 tablet Oral Daily   sodium chloride flush  3 mL Intravenous Q12H   sorbitol  60 mL Oral Q0600   torsemide  40 mg Oral Daily   umeclidinium-vilanterol  1 puff Inhalation Daily    Infusions:  sodium chloride 10 mL/hr at 04/29/21 0600   ceFEPime (MAXIPIME) IV Stopped (04/29/21 0547)    PRN Medications: sodium chloride, albuterol, baclofen, calcium carbonate, ipratropium, metoCLOPramide (REGLAN) injection, ondansetron **OR** ondansetron (ZOFRAN) IV, sodium chloride flush   Assessment/Plan  1.  Shock: Suspect mixed septic and cardiogenic (baseline RV failure).  Suspect possible gut translocation with ileus/partial SBO. Cultures negative, afebrile with normal WBCs.  BP still runs relatively low but creatinine stable at 1.02 and no lightheadedness.  - Continue cefepime until 04/30/21 (7 days).  - Remains off norepinephrine.   - Continue midodrine 15 mg tid.  2. Chronic HFpEF/RV failure: Echo (2/22) with EF 55-60%, IV septum flattened, severe RV enlargement, severely decreased RV function, PASP 57 mmHg.  He has end-stage RV failure.  Diuretics initially held this admission with over-diuresis and septic shock.  Restarted on torsemide 60 mg daily yesterday with vigorous diuresis.  - Decrease torsemide to 40 mg daily.  - Continue Midodrine 15 mg TID - TED hose 3. Pulmonary HTN: PAH noted on 4/22 RHC with PVR 6.1 WU.  This appears to be multifactorial with OSA, severe emphysema, and a suspected chronic PE involving the right middle lobe (group 3 and group 4 PH). Given the suspected mixed etiology with only 1 area of chronic thromboembolism (right middle lobe) as well as age, do not think that pulmonary thromboendarterectomy would be indicated.  Rheumatologic serologic workup was negative.  PFTs showed severe obstruction and moderate  restriction, suggesting significant COPD. - Continue riociguat for CTEPH, 1.5 mg bid.  - c/w supp O2, on chronic 02 at home 4. AKI on Stage III CKD: Baseline SCr ~1.6. Scr 1.47 at d/c on 08/30. Up to 3.69 on admit -> 1.77 -> 1.07 -> 0.9 today.  Likely AKI due to hypovolemia and RV failure with nausea/vomiting poor po intake and ongoing diuretic use + septic shock  5. Elevated troponin:  HS trop 247 > 499 > 717 > 893>102. No chest pain.   Likely due to demand ischemia from hypovolemia/low BP.  6. Transaminitis/shock liver from hypotension: Improving. No significant abnormality identified on RUQ [place]. Small amount of perihepatic ascites. Ammonia level  ok at 14 (9/6) 7. Ileus/partial SBO:  CT abdomen 04/19/21 with narrowing distal third duodenum with upstream dilatation. 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 Suspect ileus versus partial SBO, not certain this is due to the "nutcracker phenomenon" that has been there for a long time.  Has bowel sounds. No aspiration noted on swallow evaluation.  - has been seen by [facility]. Not surgical candidate.  - Currently tolerating a solid diet.   8. OSA: Untreated.  Insurance would apparently not cover home sleep study.  Need to address this as outpatient. Can he get home SS (wants this) versus lab sleep study? 9. Emphysema: Prior smoker.  Emphysema on CT and severe obstruction on PFTs - He uses home oxygen  10. Chronic PE: Continue apixaban. No further hemoptysis 11. BPH with urinary retention: He has had issues with this in the past, often self caths 3-4x/day. Concern for hygiene? Placed foley. - On Flomax.  - Had outpatient f/u scheduled but missed d/t admission 12. Hiccups: Have been intractable.   - On gabapentin, Reglan.  - Baclofen helped.  13. Hypokalemia/ Hypomagnesemia: Stable.  59. GOC   DNR/DNI   Ok to move out of ICU. Should be ready for home soon.   Loralie Champagne 04/29/2021 7:37 AM

## 2021-04-29 NOTE — Progress Notes (Cosign Needed)
   Durable Medical Equipment (From admission, onward)        Start     Ordered  04/29/21 1012  For home use only DME Hospital bed  Once      Question Answer Comment Length of Need 6 Months  Patient has (list medical condition): chronic heart failure  The above medical condition requires: Patient requires the ability to reposition frequently  Bed type Semi-electric  Support Surface: Gel Overlay    04/29/21 1012

## 2021-04-29 NOTE — Progress Notes (Signed)
PROGRESS NOTE    Joshua [name]  DZH:[id number] DOB: 1935-12-05 DOA: 04/19/2021 PCP: [name], DO   Brief Narrative:  The patient is an 85 year old elderly overweight African-American male with end-stage right ventricular failure on home midodrine who presented to the ED on 04/19/2021 with reported progressive nausea and vomiting for 48 hours.  He recently seen and hospitalized and discharged on 04/17/2021 for CHF exacerbation at that time he started on Farxiga and midodrine.  On arrival to the ED his blood pressure was 61/41 and his heart rate was 87 his temperature of 99.1.  Chest x-ray showed interstitial coarsening mild edema that has been increased.  CT of the abdomen pelvis was done and showed narrowing third portion of the duodenum between the aneurysmal abdominal aorta or the SMV with upstream dilation suggestive of nutcracker phenomenon, small ventricle abdominal hernia containing a loop of nonobstructed bowel.  Cardiology, general surgery all consulted.  He underwent an upper GI series with no significant duodenal narrowing and stricturing.  General surgery signed off and diet was advanced.  Throughout the stay has been treated for CHF exacerbation that was complicated by AKI and hypotension.  On 04/24/2021 he had hypotension with systolic blood pressures in the 70s to 80s requiring transfer to the ICU for vasopressors.  Subsequently he was weaned off pressors and central line was removed and he was transferred to the [facility] service 04/28/2021.  He was admitted for acute on chronic shock likely related to aspiration in the setting of an ileus with patient with little cardiopulmonary reserve and was placed on empiric antibiotics to complete a 7-day course of cefepime.  Cardiology been following and given that his volume status is slightly overloaded they have started the patient back on diuretics.  He continues to be significantly deconditioned and PT OT will need to see the patient.  They are  recommending home health at discharge with supervision.  Palliative care is also seeing the patient for goals of care discussion and he is Limited Code.  Patient's blood pressure was a little low again this morning so his diuresis has been decreased by cardiology.  We will continue his home midodrine.  Assessment & Plan:   Principal Problem:   Intractable nausea and vomiting Active Problems:   History of pulmonary embolism   Chronic respiratory failure with hypoxia (HCC)   COPD (chronic obstructive pulmonary disease) (HCC)   BPH with urinary obstruction   Chronic right-sided heart failure (HCC)   Acute renal failure superimposed on stage 3 chronic kidney disease (HCC)   Hyperkalemia   Transaminitis   Elevated troponin   Duodenal anomaly  Acute on chronic shock probably related to aspiration, ileus in patient with little cardiopulmonary reserve improving -recent cultures negative, follow until finalized -Shock was likely a mixed picture between septic and cardiogenic based on his right ventricular failure -Initial thought was possible gut translocation with ileus and partial bowel small bowel obstruction -He remained stable off of norepinephrine vasopressors - Continue empiric cefepime to complete 7-day course and he has 2 more days of antibiotics after today to complete the course on 912 - Currently he is being continued on midodrine 15 mg p.o. 3 times daily with meals; continue monitor blood pressures per protocol and has blood pressure were a little low this morning -His diuretics were held in the setting of his shock and hypotension and he was started on broad-spectrum antibiotics and now that he is improved cardiology is going to be restarting his diuretics of torsemide  and he received 60 mg yesterday and will be decreased down to 40 mg today by cardiology -Okay to discontinue CVC upon leaving ICU -Continue PT OT to monitor   Group 2/3/4 pulmonary HTN on [place] Chronic diastolic  CHF/right ventricular failure -He has an echo of 55 to 60% based on last study in 222 and it showed an IV septum that was flattened and severe right ventricular enlargement and severely decreased right ventricular function of the PSAP of 57 mm per mercury -Cardiology's been consulted for his end-stage right ventricular failure and diuretics were initially held due to his overdiuresis and septic shock now that he is developed mild volume overload cardiology is restarted his torsemide and added TED hose along with his pulmonary hypertension -Needs outpatient sleep study - Maintain saturations greater than 90%. -Continue Eliquis for history of PE with 5 mg p.o. twice daily -Continue Riociguat 1.5 mg po TID -Strict I's and O's and Daily Weights; Patient is given 11.639 L since admission -Continue with albuterol 2.5 mg every 4 as needed shortness of breath as well as an oral Ellipta 1 puff IH daily -Cardiology has resumed his torsemide at 60 mg p.o. daily -Cardiology evaluated and does not feel that the pulmonary thromboendarterectomy would be indicated at this time and his rheumatological serological work-up was negative - Can continue supplemental oxygen via nasal cannula and wean O2 as tolerated to home O2 -Repeat chest x-ray in the a.m. and will need an ambulatory home O2 screen -The day before yesterday patient ambulated in the hall with supervision with a rolling walker 3 L with stable vital signs   Acute liver injury likely due to acute right heart failure - Continue to monitor periodically -In the setting of hypotension -This has been improving -He had right upper quadrant ultrasound which showed no significant abnormality but he did have a small amount of perihepatic ascites -Normal ammonia level -His AST is now normalized and is now 30 and ALT is trending down and is now 78 with a normal T bilirubin of 1.0 -Continue monitor and trend and repeat CMP in a.m.  Ileus and partial small bowel  obstruction -On 04/20/1999 22-year narrowing distal third of the duodenum with upstream dilation with a "nutcracker phenomenon and abrupt narrowing of the third portion of the duodenum as it passed between the abdominal aorta and its SMV -The suspected ileus versus partial small bowel obstruction but they feel that this nutcracker phenomena has been there for a long time -General surgery evaluated and he is not a surgical candidate and is advanced to a solid diet and tolerating this without issues  AKI on CKD stage IIIa -Improved and patient is BUN/creatinine improved to 20/0.90 from 27/1.40 and today it is now 22/1.02 and stable -His baseline creatinine appears to be around 1.3-1.6 -His AKI was likely in the setting of hypovolemia and right ventricular failure with his intractable nausea vomiting and poor p.o. intake as well as ongoing diuretic use and septic shock -Avoid further nephrotoxic medications, contrast dyes, hypotension renally dose medications -Repeat CMP in the a.m.  Hypophosphatemia -Patient's Phos Level was 2.4 -Replete with po KPHos Neutral 500 mg x1 -Continue to Monitor and Replete as Necessary -Repeat CMP in the AM   COPD OSA History of lobar PE - Continue weaning supplemental oxygen as able -Needs outpatient sleep study -Continue Eliquis -Escalate bronchodilators to Anoro daily  -Atrovent as needed -Pulmonary following and plans to check on him next week -Per cardiology apparently his insurance company will  not approve will cover a home sleep study but this needs to be addressed in outpatient setting and he does have severe emphysema on CT and obstruction on PFTs -Continue supplemental oxygen via nasal cannula and continue apixaban for his chronic PE   Deconditioning - Continue mobility, PT and OT -OT recommending home health OT with intermittent supervision and PT evaluation recommending home health PT   Nausea, constipation -Continue Reglan, Zofran, bowel  regimen   Moderate malnutrition -Patient underwent a 48-hour calorie count and his appetite and p.o. intake is improved and he is taking Ensure and supplements and they are recommending continue regular diet in addition to other interventions including Ensure Enlive 3 times daily as well as multivitamin daily and Magic cup 3 times daily -Protein shakes; may need to consider a lactose-free option   Hyponatremia, improved -mild and sodium is gone from 132 is now 133 yesterday and improved to 135 -Continue to monitor and trend and repeat CMP in the a.m.  BPH Acute Urinary Retention -Continue with finasteride 5 mg p.o. nightly -His doxazosin was held in the setting of hypotension -He has been retaining and had 3 in and out Cath's today. Discussed with Dr. Milford Cage of Urology who recommends replacing foley and following up as an outpatient -He recommended Tamsulosin but patient has an Allergy   Delirium/Insomnia -In the setting of ICU delirium, improved significantly -We will place on delirium precautions -Monitor very carefully  Goals of care -patient is limited code -Palliative care has been following  DVT prophylaxis: Anticoagulated with apixaban Code Status: Limited code Family Communication: Discussed with the wife at bedside Disposition Plan: Pending further clinical improvement and arrangement of equipment at home prior to safe discharge disposition  Status is: Inpatient  Remains inpatient appropriate because:Unsafe d/c plan, IV treatments appropriate due to intensity of illness or inability to take PO, and Inpatient level of care appropriate due to severity of illness  Dispo:  Patient From: Home  Planned Disposition: Home with Health Care Svc  Medically stable for discharge: No     Consultants:  PCCM transfer Cardiology heart failure team General surgery Palliative care  Procedures:  Patient was transferred the ICU for vasopressors; central line is Foley catheter now  removed  Antimicrobials:  Anti-infectives (From admission, onward)    Start     Dose/Rate Route Frequency Ordered Stop   04/28/21 1400  ceFEPIme (MAXIPIME) 2 g in sodium chloride 0.9 % 100 mL IVPB        2 g 200 mL/hr over 30 Minutes Intravenous Every 8 hours 04/28/21 0802     04/25/21 1100  vancomycin (VANCOREADY) IVPB 1250 mg/250 mL  Status:  Discontinued        1,250 mg 166.7 mL/hr over 90 Minutes Intravenous Every 24 hours 04/24/21 0944 04/25/21 0739   04/24/21 1030  ceFEPIme (MAXIPIME) 2 g in sodium chloride 0.9 % 100 mL IVPB  Status:  Discontinued        2 g 200 mL/hr over 30 Minutes Intravenous Every 12 hours 04/24/21 0942 04/28/21 0802   04/24/21 1030  vancomycin (VANCOREADY) IVPB 2000 mg/400 mL        2,000 mg 200 mL/hr over 120 Minutes Intravenous  Once 04/24/21 0942 04/24/21 1302   04/24/21 1000  piperacillin-tazobactam (ZOSYN) IVPB 3.375 g  Status:  Discontinued        3.375 g 12.5 mL/hr over 240 Minutes Intravenous Every 8 hours 04/24/21 0906 04/24/21 0942        Subjective: Seen and  examined at bedside and he is much more awake today at bedside and wife was at bedside and they are requesting a hospital bed.  Patient states he feels fine and he is asymptomatic though his blood pressure is on the lower side.  He had a MAP of 73 this morning.  No CP or SOB. Cardiology recommending patient ambulate with Therapy and working on Stairs.   Objective: Vitals:   04/29/21 1300 04/29/21 1400 04/29/21 1500 04/29/21 1600  BP: (!) 94/57 93/62 91/65 99/65  Pulse: 79 74 78 80  Resp: _0 Temp:      TempSrc:      SpO2: 97% 94% 97% 98%  Weight:      Height:        Intake/Output Summary (Last 24 hours) at 04/29/2021 1709 Last data filed at 04/29/2021 1600 Gross per 24 hour  Intake 1208.06 ml  Output 3350 ml  Net -2141.94 ml    Filed Weights   04/26/21 0500 04/26/21 0600 04/27/21 0600  Weight: 99.9 kg 99.9 kg 99.8 kg   Examination: Physical  Exam:  Constitutional: WN/WD overweight chronically ill-appearing African-American male currently in no acute distress appears calm and comfortable sitting the chair at bedside and is much more awake and alert today Eyes: Lids and conjunctivae normal, sclerae anicteric  ENMT: External Ears, Nose appear normal. Grossly normal hearing. Mucous membranes are moist.  Neck: Appears normal, supple, no cervical masses, normal ROM, no appreciable thyromegaly; slight JVD Respiratory: Diminished to auscultation bilaterally with coarse breath sounds, no wheezing, rales, rhonchi or crackles. Normal respiratory effort and patient is not tachypenic. No accessory muscle use.  Unlabored breathing and wearing his baseline supplemental oxygen via nasal cannula Cardiovascular: RRR, no murmurs / rubs / gallops. S1 and S2 auscultated.  1+ lower extremity edema Abdomen: Soft, non-tender, non-distended. Bowel sounds positive.  GU: Deferred. Musculoskeletal: No clubbing / cyanosis of digits/nails. No joint deformity upper and lower extremities.  Skin: No rashes, lesions, ulcers. No induration; Warm and dry.  Neurologic: CN 2-12 grossly intact with no focal deficits. Romberg sign and cerebellar reflexes not assessed.  Psychiatric: Normal judgment and insight. Alert and oriented x 3. Normal mood and appropriate affect.    Data Reviewed: I have personally reviewed following labs and imaging studies  CBC: Recent Labs  Lab 04/25/21 0440 04/26/21 0446 04/27/21 0113 04/28/21 0100 04/29/21 0147  WBC 12.1* 8.0 5.5 5.4 7.1  NEUTROABS  --   --   --   --  5.2  HGB 15.5 14.9 14.8 14.6 14.5  HCT 47.8 45.4 45.6 44.3 44.2  MCV 88.5 86.1 85.6 85.2 85.5  PLT 108* 120* 106* 136* 383    Basic Metabolic Panel: Recent Labs  Lab 04/25/21 0440 04/25/21 1353 04/25/21 2006 04/26/21 0446 04/27/21 0113 04/28/21 0100 04/29/21 0147  NA 137 133*  --  132* 132* 133* 135  K 3.0* 3.9  --  3.6 4.6 4.1 4.1  CL 92* 91*  --  87* 95*  97* 96*  CO2 33* 35*  --  37* 28 30 34*  GLUCOSE 109* 140*  --  107* 65* 86 100*  BUN 27* 33*  --  29* 26* 20 22  CREATININE 1.40* 1.47*  --  1.08 1.07 0.90 1.02  CALCIUM 8.5* 8.9  --  9.4 9.5 9.5 9.5  MG 1.1*  --  2.0 1.8 1.8 1.8 1.9  PHOS 2.9  --   --   --   --   --  2.4*    GFR: Estimated Creatinine Clearance: 65.6 mL/min (by C-G formula based on SCr of 1.02 mg/dL). Liver Function Tests: Recent Labs  Lab 04/23/21 0016 04/24/21 0027 04/25/21 0440 04/28/21 0110 04/29/21 0147  AST 347* 173* 56* 27 30  ALT 584* 459* 226* 95* 78*  ALKPHOS 86 90 71 61 66  BILITOT 2.5* 2.2* 1.9* 1.2 1.0  PROT 6.4* 6.8 5.6* 5.9* 6.2*  ALBUMIN 2.9* 3.1* 2.4* 2.3* 2.4*    No results for input(s): LIPASE, AMYLASE in the last 168 hours. Recent Labs  Lab 04/24/21 1121  AMMONIA 14    Coagulation Profile: No results for input(s): INR, PROTIME in the last 168 hours. Cardiac Enzymes: No results for input(s): CKTOTAL, CKMB, CKMBINDEX, TROPONINI in the last 168 hours. BNP (last 3 results) No results for input(s): PROBNP in the last 8760 hours. HbA1C: No results for input(s): HGBA1C in the last 72 hours. CBG: Recent Labs  Lab 04/24/21 0856  GLUCAP 122*    Lipid Profile: No results for input(s): CHOL, HDL, LDLCALC, TRIG, CHOLHDL, LDLDIRECT in the last 72 hours. Thyroid Function Tests: No results for input(s): TSH, T4TOTAL, FREET4, T3FREE, THYROIDAB in the last 72 hours. Anemia Panel: No results for input(s): VITAMINB12, FOLATE, FERRITIN, TIBC, IRON, RETICCTPCT in the last 72 hours. Sepsis Labs: Recent Labs  Lab 04/24/21 0850 04/24/21 1121 04/24/21 1323 04/25/21 0440 04/26/21 0446  PROCALCITON 1.04  --   --  0.77 0.56  LATICACIDVEN  --  1.0 0.9  --   --      Recent Results (from the past 240 hour(s))  Culture, blood (routine x 2)     Status: None   Collection Time: 04/20/21 11:50 AM   Specimen: BLOOD RIGHT ARM  Result Value Ref Range Status   Specimen Description BLOOD RIGHT ARM   Final   Special Requests   Final    BOTTLES DRAWN AEROBIC AND ANAEROBIC Blood Culture adequate volume   Culture   Final    NO GROWTH 5 DAYS Performed at [facility], [address], [place], [place] [address]    Report Status 04/25/2021 FINAL  Final  Culture, blood (routine x 2)     Status: None   Collection Time: 04/20/21 11:56 AM   Specimen: BLOOD LEFT HAND  Result Value Ref Range Status   Specimen Description BLOOD LEFT HAND  Final   Special Requests   Final    BOTTLES DRAWN AEROBIC ONLY Blood Culture results may not be optimal due to an inadequate volume of blood received in culture bottles   Culture   Final    NO GROWTH 5 DAYS Performed at [facility], [place] [address] [place], [place] [address]    Report Status 04/25/2021 FINAL  Final  Culture, blood (routine x 2)     Status: None   Collection Time: 04/24/21  8:58 AM   Specimen: BLOOD LEFT HAND  Result Value Ref Range Status   Specimen Description BLOOD LEFT HAND  Final   Special Requests   Final    BOTTLES DRAWN AEROBIC AND ANAEROBIC Blood Culture adequate volume   Culture   Final    NO GROWTH 5 DAYS Performed at [facility], [place] [address], [place], [place] [address]    Report Status 04/29/2021 FINAL  Final  Culture, blood (routine x 2)     Status: None   Collection Time: 04/24/21  8:58 AM   Specimen: BLOOD LEFT FOREARM  Result Value Ref Range  Status   Specimen Description BLOOD LEFT FOREARM  Final   Special Requests   Final    BOTTLES DRAWN AEROBIC AND ANAEROBIC Blood Culture adequate volume   Culture   Final    NO GROWTH 5 DAYS Performed at [facility], [address] [place], [place] [address]    Report Status 04/29/2021 FINAL  Final  Expectorated Sputum Assessment w Gram Stain, Rflx to Resp Cult     Status: None   Collection Time: 04/24/21 11:01 AM   Specimen: Expectorated Sputum  Result Value Ref Range Status   Specimen Description EXPECTORATED SPUTUM  Final   Special Requests  NONE  Final   Sputum evaluation   Final    THIS SPECIMEN IS ACCEPTABLE FOR SPUTUM CULTURE Performed at [facility], [place] [address], [place], [place] [address]    Report Status 04/24/2021 FINAL  Final  Culture, Respiratory w Gram Stain     Status: None   Collection Time: 04/24/21 11:01 AM  Result Value Ref Range Status   Specimen Description EXPECTORATED SPUTUM  Final   Special Requests NONE Reflexed from [id number]  Final   Gram Stain   Final    ABUNDANT WBC PRESENT,BOTH PMN AND MONONUCLEAR FEW SQUAMOUS EPITHELIAL CELLS PRESENT FEW GRAM POSITIVE COCCI RARE GRAM VARIABLE ROD RARE YEAST    Culture   Final    Normal respiratory flora-no Staph aureus or Pseudomonas seen Performed at [facility], [place] [address], [place], [place] [address]    Report Status 04/26/2021 FINAL  Final  MRSA Next Gen by PCR, Nasal     Status: None   Collection Time: 04/24/21  2:00 PM   Specimen: Nasal Mucosa; Nasal Swab  Result Value Ref Range Status   MRSA by PCR Next Gen NOT DETECTED NOT DETECTED Final    Comment: (NOTE) The GeneXpert MRSA Assay (FDA approved for NASAL specimens only), is one component of a comprehensive MRSA colonization surveillance program. It is not intended to diagnose MRSA infection nor to guide or monitor treatment for MRSA infections. Test performance is not FDA approved in patients less than 90 years old. Performed at [facility], [place] [address], [place], [place] [address]      RN Pressure Injury Documentation:     Estimated body mass index is 27.5 kg/m as calculated from the following:   Height as of this encounter: _0  (1.905 m).   Weight as of this encounter: 99.8 kg.  Malnutrition Type:  Nutrition Problem: Inadequate oral intake Etiology: decreased appetite  Malnutrition Characteristics:  Signs/Symptoms: per patient/family report  Nutrition Interventions:  Interventions: Ensure Enlive (each supplement provides 350kcal and 20 grams of  protein), MVI, Magic cup   Radiology Studies: No results found.  Scheduled Meds:  allopurinol  100 mg Oral q morning   apixaban  5 mg Oral BID   bisacodyl  10 mg Rectal Daily   Chlorhexidine Gluconate Cloth  6 each Topical Daily   feeding supplement  237 mL Oral TID BM   finasteride  5 mg Oral QHS   gabapentin  200 mg Oral TID   metoCLOPramide (REGLAN) injection  10 mg Intravenous Q8H   midodrine  15 mg Oral TID WC   multivitamin with minerals  1 tablet Oral Daily   pantoprazole  40 mg Oral q morning   polyethylene glycol  17 g Oral Daily   potassium chloride  20 mEq Oral Daily   Riociguat  1.5 mg Oral  TID   senna-docusate  1 tablet Oral Daily   sodium chloride flush  3 mL Intravenous Q12H   sorbitol  60 mL Oral Q0600   torsemide  40 mg Oral Daily   umeclidinium-vilanterol  1 puff Inhalation Daily   Continuous Infusions:  sodium chloride 250 mL (04/29/21 1113)   ceFEPime (MAXIPIME) IV 2 g (04/29/21 1114)    LOS: 10 days   Kerney Elbe, DO [facility] PAGER is on [place]  If 7PM-7AM, please contact night-coverage [email or web address]

## 2021-04-30 DIAGNOSIS — Z515 Encounter for palliative care: Secondary | ICD-10-CM | POA: Diagnosis not present

## 2021-04-30 DIAGNOSIS — I50812 Chronic right heart failure: Secondary | ICD-10-CM | POA: Diagnosis not present

## 2021-04-30 DIAGNOSIS — J9611 Chronic respiratory failure with hypoxia: Secondary | ICD-10-CM | POA: Diagnosis not present

## 2021-04-30 DIAGNOSIS — R112 Nausea with vomiting, unspecified: Secondary | ICD-10-CM | POA: Diagnosis not present

## 2021-04-30 DIAGNOSIS — N170 Acute kidney failure with tubular necrosis: Secondary | ICD-10-CM | POA: Diagnosis not present

## 2021-04-30 DIAGNOSIS — Z66 Do not resuscitate: Secondary | ICD-10-CM | POA: Diagnosis not present

## 2021-04-30 DIAGNOSIS — N401 Enlarged prostate with lower urinary tract symptoms: Secondary | ICD-10-CM | POA: Diagnosis not present

## 2021-04-30 DIAGNOSIS — I509 Heart failure, unspecified: Secondary | ICD-10-CM | POA: Diagnosis not present

## 2021-04-30 LAB — COMPREHENSIVE METABOLIC PANEL
ALT: UNDETERMINED U/L (ref 0–44)
AST: 32 U/L (ref 15–41)
Albumin: 2.1 g/dL — ABNORMAL LOW (ref 3.5–5.0)
Alkaline Phosphatase: 66 U/L (ref 38–126)
Anion gap: 10 (ref 5–15)
BUN: 24 mg/dL — ABNORMAL HIGH (ref 8–23)
CO2: 28 mmol/L (ref 22–32)
Calcium: 10.9 mg/dL — ABNORMAL HIGH (ref 8.9–10.3)
Chloride: 96 mmol/L — ABNORMAL LOW (ref 98–111)
Creatinine, Ser: 1.22 mg/dL (ref 0.61–1.24)
GFR, Estimated: 59 mL/min — ABNORMAL LOW (ref >60)
Glucose, Bld: 76 mg/dL (ref 70–99)
Potassium: 5 mmol/L (ref 3.5–5.1)
Sodium: 134 mmol/L — ABNORMAL LOW (ref 135–145)
Total Bilirubin: UNDETERMINED mg/dL (ref 0.3–1.2)
Total Protein: 5.6 g/dL — ABNORMAL LOW (ref 6.5–8.1)

## 2021-04-30 LAB — CBC WITH DIFFERENTIAL/PLATELET
Abs Immature Granulocytes: 0.07 10*3/uL (ref 0.00–0.07)
Basophils Absolute: 0 10*3/uL (ref 0.0–0.1)
Basophils Relative: 1 %
Eosinophils Absolute: 0.3 10*3/uL (ref 0.0–0.5)
Eosinophils Relative: 4 %
HCT: 43.1 % (ref 39.0–52.0)
Hemoglobin: 14.6 g/dL (ref 13.0–17.0)
Immature Granulocytes: 1 %
Lymphocytes Relative: 10 %
Lymphs Abs: 0.8 10*3/uL (ref 0.7–4.0)
MCH: 28.3 pg (ref 26.0–34.0)
MCHC: 33.9 g/dL (ref 30.0–36.0)
MCV: 83.7 fL (ref 80.0–100.0)
Monocytes Absolute: 0.9 10*3/uL (ref 0.1–1.0)
Monocytes Relative: 12 %
Neutro Abs: 5.4 10*3/uL (ref 1.7–7.7)
Neutrophils Relative %: 72 %
Platelets: 161 10*3/uL (ref 150–400)
RBC: 5.15 MIL/uL (ref 4.22–5.81)
RDW: 15.5 % (ref 11.5–15.5)
WBC: 7.5 10*3/uL (ref 4.0–10.5)
nRBC: 0 % (ref 0.0–0.2)

## 2021-04-30 LAB — MAGNESIUM: Magnesium: 1.6 mg/dL — ABNORMAL LOW (ref 1.7–2.4)

## 2021-04-30 LAB — PHOSPHORUS: Phosphorus: 2.8 mg/dL (ref 2.5–4.6)

## 2021-04-30 MED ORDER — SENNOSIDES-DOCUSATE SODIUM 8.6-50 MG PO TABS: 1.0000 | ORAL_TABLET | Freq: Every day | ORAL | 0 refills | Status: DC

## 2021-04-30 MED ORDER — ADULT MULTIVITAMIN W/MINERALS CH: 1.0000 | ORAL_TABLET | Freq: Every day | ORAL | 0 refills | Status: AC

## 2021-04-30 MED ORDER — UMECLIDINIUM-VILANTEROL 62.5-25 MCG/INH IN AEPB: 1.0000 | INHALATION_SPRAY | Freq: Every day | RESPIRATORY_TRACT | 0 refills | Status: DC

## 2021-04-30 MED ORDER — TORSEMIDE 60 MG PO TABS: 60.0000 mg | ORAL_TABLET | Freq: Every day | ORAL | 0 refills | Status: DC

## 2021-04-30 MED ORDER — ENSURE ENLIVE PO LIQD: 237.0000 mL | Freq: Three times a day (TID) | ORAL | 12 refills | Status: DC

## 2021-04-30 MED ORDER — MAGNESIUM SULFATE 4 GM/100ML IV SOLN
4.0000 g | Freq: Once | INTRAVENOUS | Status: AC
  Administered 2021-04-30: 4 g via INTRAVENOUS
  Filled 2021-04-30: qty 100

## 2021-04-30 MED ORDER — TORSEMIDE 20 MG PO TABS
60.0000 mg | ORAL_TABLET | Freq: Every day | ORAL | Status: DC
  Administered 2021-04-30: 60 mg via ORAL
  Filled 2021-04-30: qty 3

## 2021-04-30 MED ORDER — POLYETHYLENE GLYCOL 3350 17 G PO PACK: 17.0000 g | PACK | Freq: Every day | ORAL | 0 refills | Status: DC

## 2021-04-30 MED ORDER — ONDANSETRON HCL 4 MG PO TABS: 4.0000 mg | ORAL_TABLET | Freq: Four times a day (QID) | ORAL | 0 refills | Status: DC | PRN

## 2021-04-30 MED ORDER — MIDODRINE HCL 5 MG PO TABS: 15.0000 mg | ORAL_TABLET | Freq: Three times a day (TID) | ORAL | 0 refills | Status: DC

## 2021-04-30 MED ORDER — POTASSIUM CHLORIDE ER 20 MEQ PO TBCR: 20.0000 meq | EXTENDED_RELEASE_TABLET | Freq: Every day | ORAL | 0 refills | Status: DC

## 2021-04-30 NOTE — Progress Notes (Signed)
Pt eager to go home today. Able to reach his feet for bathing and dressing from sitting position. Educated in energy conservation strategies and importance of using his home 02. Pt verbalizing understanding.    04/30/21 1319  OT Visit Information  Last OT Received On 04/30/21  Assistance Needed +1  History of Present Illness Pt is an 85 y.o. male admitted 04/19/21 with intractable nausea/vomiting. CT of abdomen/pelvis showed narrowing of duodenum and SMV with upstream dilation suggestive of nutcracker phenomenon. Course complicated by AKI, hypotension; transfer to ICU 9/6 for vasopressors. Of note, recent admission 04/12/21-04/17/21 for CHF. Other PMH includes COPD (on 2L O2 baseline), pulmonary HTN, CKD, PE on Eliquis, L TKA.  Precautions  Precautions Fall  Precaution Comments Watch SpO2 (wears 2L O2 baseline)  Pain Assessment  Pain Assessment No/denies pain  Cognition  Arousal/Alertness Awake/alert  Behavior During Therapy WFL for tasks assessed/performed  Overall Cognitive Status Within Functional Limits for tasks assessed  ADL  Overall ADL's  Needs assistance/impaired  Lower Body Dressing Set up;Sitting/lateral leans  Lower Body Dressing Details (indicate cue type and reason) able to don and doff socks  Functional mobility during ADLs Min guard  General ADL Comments pushed IV pole and ambulated in hall, educated on importance of 02 use, sitting to shower and using 02 in shower with tank, educated in energy conservation  Balance  Sitting balance-Leahy Scale Good  Standing balance-Leahy Scale Fair  Transfers  Overall transfer level Needs assistance  Equipment used None  Transfers Sit to/from Stand  Sit to Stand Supervision  OT - End of Session  Equipment Utilized During Treatment Oxygen  Activity Tolerance Patient tolerated treatment well  Patient left in chair;with call bell/phone within reach;with family/visitor present  OT Assessment/Plan  OT Plan Discharge plan remains  appropriate  OT Visit Diagnosis Unsteadiness on feet (R26.81);Other abnormalities of gait and mobility (R26.89);Muscle weakness (generalized) (M62.81)  OT Frequency (ACUTE ONLY) Min 2X/week  Follow Up Recommendations Home health OT;Supervision - Intermittent  OT Equipment None recommended by OT  AM-PAC OT "6 Clicks" Daily Activity Outcome Measure (Version 2)  Help from another person eating meals? 4  Help from another person taking care of personal grooming? 3  Help from another person toileting, which includes using toliet, bedpan, or urinal? 3  Help from another person bathing (including washing, rinsing, drying)? 3  Help from another person to put on and taking off regular upper body clothing? 4  Help from another person to put on and taking off regular lower body clothing? 3  6 Click Score 20  Progressive Mobility  What is the highest level of mobility based on the progressive mobility assessment? Level 5 (Walks with assist in room/hall) - Balance while stepping forward/back and can walk in room with assist - Complete  Mobility Ambulated with assistance in hallway  OT Goal Progression  Progress towards OT goals Progressing toward goals  Acute Rehab OT Goals  Patient Stated Goal to be able to go home  OT Goal Formulation With patient  Time For Goal Achievement 05/05/21  Potential to Achieve Goals Good  OT Time Calculation  OT Start Time (ACUTE ONLY) 1205  OT Stop Time (ACUTE ONLY) 1220  OT Time Calculation (min) 15 min  OT General Charges  $OT Visit 1 Visit  OT Treatments  $Self Care/Home Management  8-22 mins  [name], OTR/L Acute Rehabilitation Services Pager: [phone] Office: [phone]

## 2021-04-30 NOTE — TOC Transition Note (Signed)
Transition of Care [facility]) - CM/SW Discharge Note   Patient Details  Name: Joshua [name] MRN: [id number] Date of Birth: 1935/11/14  Transition of Care [facility]) CM/SW Contact:  Joshua Rasher, RN Phone Number: [phone] 04/30/2021, 12:01 PM   Clinical Narrative:     HF TOC CM spoke to pt and dtr, Joshua [name] at bedside with updates on DME and HH. Pt wants to transport home via ambulance. PTAR arranged. Dtr states she wanted to have personal care assistant come to home to assist pt and wife. States she is familiar with [facility] and provided information on [place], [facility], [facility] and [place]. Gave permission to share contact with [facility]   [employer] has started processing orders for DME, hospital bed, wheelchair, 3n1 bedside commode for home. Scheduled delivery to home today.   Received call from [employer], Caryl Pina. Stating he has completed paperwork for [employer]. Attending updated and requested DME provider follow up with pt's Pulmonologist, Dr Elsworth Soho.   Final next level of care: [place] Barriers to Discharge: No Barriers Identified   Patient Goals and CMS Choice Patient states their goals for this hospitalization and ongoing recovery are:: wants to return back to feel better CMS Medicare.gov Compare Post Acute Care list provided to:: Patient Choice offered to / list presented to : Patient  Discharge Placement                       Discharge Plan and Services In-house Referral: Clinical Social Work Discharge Planning Services: CM Consult Post Acute Care Choice: Home Health          DME Arranged: Youth worker wheelchair with seat cushion, 3-N-1, Hospital bed DME Agency: [employer] Date DME Agency Contacted: 04/30/21 Time DME Agency Contacted: [id number] Representative spoke with at DME Agency: [place]: RN, PT, OT [place] Agency: (S) [place] ([place]) Date [place]:  04/30/21 Time [place]: 1200 Representative spoke with at [place]: [place] ([place]) Interventions     Readmission Risk Interventions No flowsheet data found.

## 2021-04-30 NOTE — TOC CM/SW Note (Addendum)
..  Durable Medical Equipment  (From admission, onward)           Start     Ordered   04/30/21 1002  For home use only DME 3 n 1  Once        04/30/21 1001   04/30/21 1000  For home use only DME lightweight manual wheelchair with seat cushion  Once       Comments: Patient suffers from CHF, COPD, hx knee replacementg which impairs their ability to perform daily activities like bathing, dressing, grooming, and toileting in the home.  A cane or walker will not resolve  issue with performing activities of daily living. A wheelchair will allow patient to safely perform daily activities. Patient is not able to propel themselves in the home using a standard weight wheelchair due to endurance and general weakness. Patient can self propel in the lightweight wheelchair. Length of need Lifetime. Accessories: elevating leg rests (ELRs), wheel locks, extensions and anti-tippers, back cushion   04/30/21 1001   04/29/21 1012  For home use only DME Hospital bed  Once       Question Answer Comment  Length of Need 6 Months   Patient has (list medical condition): chronic heart failure   The above medical condition requires: Patient requires the ability to reposition frequently   Bed type Semi-electric   Support Surface: Gel Overlay      04/29/21 1012

## 2021-04-30 NOTE — TOC CM/SW Note (Signed)
HF TOC CM spoke to [place] for DME delivery to home today for possible dc home today. CM spoke to wife and she has area downstairs for DME. She would like for pt to transport home via ambulance. [place], Heart Failure TOC CM [phone]

## 2021-04-30 NOTE — Progress Notes (Signed)
Palliative Care Progress Note, Assessment & Plan   Patient Name: Joshua [name]       Date: 04/30/2021 DOB: 05-Jun-1936  Age: 85 y.o. MRN#: [id number] Attending Physician: Kerney Elbe, DO Primary Care Physician: [name], DO Admit Date: 04/19/2021  Reason for Consultation/Follow-up: Establishing goals of care  Subjective: Patient is out of bed to the chair, no complaints voiced currently, daughter  at bedside   HPI: 85 y.o. male  with past medical history of BPH (self cath), COPD (2L home oxygen), GERD, hypotension, dCHF, CKD, PE, and sleep apnea.   He was admitted on 04/19/2021 from home with intractable nausea/vomiting. Patient recently discharged on 8/30 for CHF exacerbation. During work-up BNP 2618, potassium 5.5, and BP 61/41. CT of abdomen/pelvis showed narrowing of duodenum and SMV with upstream dilation suggestive of nutcracker phenomenon.  Patient  has improved with medical management and plan is for discharge home with [facility]  PMT consulted to help with establishing GCOs  Patient faces treatment option decisions, advanced directive decisions and anticipatory care needs.   Goals of care  Continued education  at bedside  regarding current medical situation and seriousness of his co-morbidities.   Patient  verbalizes understanding.  At this time Joshua [name] remains open to all offered and available medical interventions to prolong quality of life and is hopeful for quality time.   Education offered with patient and his daughter  the importance of continued conversation with each other and the medical providers regarding overall plan of care and treatment options,  ensuring decisions are within the context of the patients values and GOCs.  Questions and concerns were addressed.   The  patient/ family was encouraged to call with questions or concerns into the future.   Recommend OP Community based palliative services on discharge.  Code Status: DNR/DNI  Prognosis:  Unable to determine  Discharge Planning:  Home with HH and OP PCS    Length of Stay: 11  Physical Exam Constitutional:      Appearance: Normal appearance.  HENT:     Head: Normocephalic and atraumatic.     Mouth/Throat:     Mouth: Mucous membranes are moist.  Cardiovascular:     Rate and Rhythm: Tachycardia present.  Pulmonary:     Effort: Pulmonary effort is normal.     Comments: Thick secretions that patient is able to clear with a strong cough Neurological:     Mental Status: He is alert and oriented to person, place, and time.  Psychiatric:        Mood and Affect: Mood normal.        Behavior: Behavior normal.        Thought Content: Thought content normal.            Vital Signs: BP (!) 88/62   Pulse 80   Temp 98.5 F (36.9 C) (Oral)   Resp 15   Ht _0  (1.905 m)   Wt 99.8 kg   SpO2 90%   BMI 27.50 kg/m  SpO2: SpO2: 90 % O2 Device: O2 Device: Nasal Cannula O2 Flow Rate: O2 Flow Rate (L/min): 2 L/min    Total Time 15 minutes Prolonged Time Billed  no  Greater than 50%  of this time was spent counseling and coordinating care related to the above assessment and plan.  Thank you for allowing the Palliative Medicine Team to assist in the care of this patient.

## 2021-04-30 NOTE — Discharge Summary (Signed)
Physician Discharge Summary  Joshua [name] YIF:[id number] DOB: March 01, 1936 DOA: 04/19/2021  PCP: [name], DO  Admit date: 04/19/2021 Discharge date: 04/30/2021  Admitted From: Home Disposition:  Home with Home Health   Recommendations for Outpatient Follow-up:  Follow up with PCP in 1-2 weeks Please obtain BMP/CBC in one week Please follow up on the following pending results:  Home Health: YES  Equipment/Devices: DME Hospital Bed, Wheelchair, 3n1    Discharge Condition: Stable  CODE STATUS: FULL CODE Diet recommendation: Heart Healthy Diet   Brief/Interim Summary: home midodrine who presented to the ED on 04/19/2021 with reported progressive nausea and vomiting for 48 hours.  He recently seen and hospitalized and discharged on 04/17/2021 for CHF exacerbation at that time he started on Farxiga and midodrine.  On arrival to the ED his blood pressure was 61/41 and his heart rate was 87 his temperature of 99.1.  Chest x-ray showed interstitial coarsening mild edema that has been increased.  CT of the abdomen pelvis was done and showed narrowing third portion of the duodenum between the aneurysmal abdominal aorta or the SMV with upstream dilation suggestive of nutcracker phenomenon, small ventricle abdominal hernia containing a loop of nonobstructed bowel.  Cardiology, general surgery all consulted.  He underwent an upper GI series with no significant duodenal narrowing and stricturing.  General surgery signed off and diet was advanced.  Throughout the stay has been treated for CHF exacerbation that was complicated by AKI and hypotension.  On 04/24/2021 he had hypotension with systolic blood pressures in the 70s to 80s requiring transfer to the ICU for vasopressors.  Subsequently he was weaned off pressors and central line was removed and he was transferred to the [facility] service 04/28/2021.  He was admitted for acute on chronic shock likely related to aspiration in the setting of an ileus with patient with  little cardiopulmonary reserve and was placed on empiric antibiotics to complete a 7-day course of cefepime.  Cardiology been following and given that his volume status is slightly overloaded they have started the patient back on diuretics.  He continues to be significantly deconditioned and PT OT will need to see the patient.  They are recommending home health at discharge with supervision.  Palliative care is also seeing the patient for goals of care discussion and he is Limited Code.   Patient's blood pressure was a little low again yesterday morning so his diuresis has been decreased by cardiology but now increased back to 60 mg daily.  We will continue his home midodrine.  Cardiology recommended discharging the patient home on current medication orders.  At the time of discharge his blood pressure was 109/82.  He is doing medically stable to follow-up with PCP, cardiology, urology in outpatient setting and all questions were answered to his satisfaction.  Discharge Diagnoses:  Principal Problem:   Intractable nausea and vomiting Active Problems:   History of pulmonary embolism   Chronic respiratory failure with hypoxia (HCC)   COPD (chronic obstructive pulmonary disease) (HCC)   BPH with urinary obstruction   Chronic right-sided heart failure (HCC)   Acute renal failure superimposed on stage 3 chronic kidney disease (HCC)   Hyperkalemia   Transaminitis   Elevated troponin   Duodenal anomaly  Acute on chronic shock probably related to aspiration, ileus in patient with little cardiopulmonary reserve improving -recent cultures negative, follow until finalized -Shock was likely a mixed picture between septic and cardiogenic based on his right ventricular failure -Initial thought was possible gut  translocation with ileus and partial bowel small bowel obstruction -He remained stable off of norepinephrine vasopressors - Continue empiric cefepime to complete 7-day course and he has 2 more days  of antibiotics after today to complete the course on 912 - Currently he is being continued on midodrine 15 mg p.o. 3 times daily with meals at discharge; continue monitor blood pressures per protocol  -His diuretics were held in the setting of his shock and hypotension and he was started on broad-spectrum antibiotics and now that he is improved cardiology is going to be restarting his diuretics of torsemide and he received 60 mg the day before yesterday and was decreased down to 40 mg yesterday by cardiology; cardiology now recommending discharging on 60 mg daily with 20 mEq of KCl daily -Okay to discontinue CVC upon leaving ICU -Continue PT OT to monitor and follow-up with PCP, cardiology as well as pulmonology in outpatient setting -Patient has a cardiology appointment within 1 to 2 weeks -Last blood pressure prior to discharge was 109/82   Group 2/3/4 pulmonary HTN on Adempas Chronic diastolic CHF/right ventricular failure -He has an echo of 55 to 60% based on last study in 222 and it showed an IV septum that was flattened and severe right ventricular enlargement and severely decreased right ventricular function of the PSAP of 57 mm per mercury -Cardiology's been consulted for his end-stage right ventricular failure and diuretics were initially held due to his overdiuresis and septic shock now that he is developed mild volume overload cardiology is restarted his torsemide and added TED hose along with his pulmonary hypertension -Needs outpatient sleep study - Maintain saturations greater than 90%. -Continue Eliquis for history of PE with 5 mg p.o. twice daily -Continue Riociguat 1.5 mg po TID -Strict I's and O's and Daily Weights; Patient is given 11.639 L since admission -Continue with albuterol 2.5 mg every 4 as needed shortness of breath as well as an oral Ellipta 1 puff IH daily -Cardiology has resumed his torsemide at 60 mg p.o. daily -Cardiology evaluated and does not feel that the  pulmonary thromboendarterectomy would be indicated at this time and his rheumatological serological work-up was negative - Can continue supplemental oxygen via nasal cannula and wean O2 as tolerated to home O2 -The day before yesterday patient ambulated in the hall with supervision with a rolling walker 3 L with stable vital signs -Patient's respiratory status is stable and he will need to follow-up with PCP, pulmonary as well as cardiology in outpatient setting   Acute liver injury likely due to acute right heart failure - Continue to monitor periodically -In the setting of hypotension -This has been improving -He had right upper quadrant ultrasound which showed no significant abnormality but he did have a small amount of perihepatic ascites -Normal ammonia level -His AST is now normalized and is now 32 and ALT is trending down and quantity was not sufficient to perform test to see what ALT was with a normal T bilirubin of 1.0 -Continue monitor and trend and repeat CMP in a.m.   Ileus and partial small bowel obstruction -On 04/20/1999 22-year narrowing distal third of the duodenum with upstream dilation with a "nutcracker phenomenon and abrupt narrowing of the third portion of the duodenum as it passed between the abdominal aorta and its SMV -The suspected ileus versus partial small bowel obstruction but they feel that this nutcracker phenomena has been there for a long time -General surgery evaluated and he is not a surgical candidate and  is advanced to a solid diet and tolerating this without issues   AKI on CKD stage IIIa -Improved and patient is BUN/creatinine improved to 20/0.90 from 27/1.40 and yesterday was 22/1.02 and today it is 24/1.22 and stable -His baseline creatinine appears to be around 1.3-1.6 -His AKI was likely in the setting of hypovolemia and right ventricular failure with his intractable nausea vomiting and poor p.o. intake as well as ongoing diuretic use and septic  shock -Avoid further nephrotoxic medications, contrast dyes, hypotension renally dose medications -Repeat CMP within 1 week   Hypophosphatemia -Patient's Phos Level is 2.8 now -Replete with po KPHos Neutral 500 mg x1 yesterday -Continue to Monitor and Replete as Necessary -Repeat CMP in the AM   COPD OSA History of lobar PE -Continue weaning supplemental oxygen as able -Needs outpatient sleep study -Continue Eliquis -Escalate bronchodilators to Anoro daily and will discharge him with this -Atrovent as needed -Pulmonary following and plans to check on him next week -Per cardiology apparently his insurance company will not approve will cover a home sleep study but this needs to be addressed in outpatient setting and he does have severe emphysema on CT and obstruction on PFTs; pulmonary is also arranging for trilogy in outpatient setting -Continue supplemental oxygen via nasal cannula and continue apixaban for his chronic PE   Deconditioning - Continue mobility, PT and OT -OT recommending home health OT with intermittent supervision and PT evaluation recommending home health PT   Nausea, constipation -Continue Reglan, Zofran, bowel regimen  Hypomagnesemia -Mag level was 1.6 -Replete with IV mag sulfate prior to discharge -Continue monitor and trend and repeat in outpatient setting   Moderate malnutrition -Patient underwent a 48-hour calorie count and his appetite and p.o. intake is improved and he is taking Ensure and supplements and they are recommending continue regular diet in addition to other interventions including Ensure Enlive 3 times daily as well as multivitamin daily and Magic cup 3 times daily -Protein shakes; may need to consider a lactose-free option   Hyponatremia, improved -mild and sodium is gone from 132 is now 133 and improved to 135 yesterday and today it is 134 -Continue to monitor and trend and repeat CMP in the a.m.   BPH Acute Urinary  Retention -Continue with finasteride 5 mg p.o. nightly -His doxazosin was held in the setting of hypotension and will discontinue -He has been retaining and had 3 in and out Cath's during his hospitalization. Discussed with Dr. Milford Cage of Urology who recommends replacing foley and following up as an outpatient however patient self caths at home 3-4 times a day so discontinue Foley and have him follow-up with urology outpatient setting -He recommended Tamsulosin but patient has an Allergy  -Continue with finasteride at discharge   Delirium/Insomnia -In the setting of ICU delirium, improved significantly -We will place on delirium precautions -Monitor very carefully and improved significantly   Goals of care -patient is limited code -Palliative care has been following  Discharge Instructions  Discharge Instructions     ([place]) Call MD:  Anytime you have any of the following symptoms: 1) 3 pound weight gain in 24 hours or 5 pounds in 1 week 2) shortness of breath, with or without a dry hacking cough 3) swelling in the hands, feet or stomach 4) if you have to sleep on extra pillows at night in order to breathe.   Complete by: As directed    Call MD for:  difficulty breathing, headache or visual disturbances  Complete by: As directed    Call MD for:  extreme fatigue   Complete by: As directed    Call MD for:  hives   Complete by: As directed    Call MD for:  persistant dizziness or light-headedness   Complete by: As directed    Call MD for:  persistant nausea and vomiting   Complete by: As directed    Call MD for:  redness, tenderness, or signs of infection (pain, swelling, redness, odor or green/yellow discharge around incision site)   Complete by: As directed    Call MD for:  severe uncontrolled pain   Complete by: As directed    Call MD for:  temperature >100.4   Complete by: As directed    Diet - low sodium heart healthy   Complete by: As directed    Discharge  instructions   Complete by: As directed    You were cared for by a hospitalist during your hospital stay. If you have any questions about your discharge medications or the care you received while you were in the hospital after you are discharged, you can call the unit and ask to speak with the hospitalist on call if the hospitalist that took care of you is not available. Once you are discharged, your primary care physician will handle any further medical issues. Please note that NO REFILLS for any discharge medications will be authorized once you are discharged, as it is imperative that you return to your primary care physician (or establish a relationship with a primary care physician if you do not have one) for your aftercare needs so that they can reassess your need for medications and monitor your lab values.  Follow up with PCP, Cardiology, Pulmonary, and Urology in the outpatient setting. Take all medications as prescribed. If symptoms change or worsen please return to the ED for evaluation   Increase activity slowly   Complete by: As directed       Allergies as of 04/30/2021       Reactions   Other Swelling   Beer- Swelling    Sunflower Oil Swelling   Sulfa Antibiotics Rash        Medication List     STOP taking these medications    doxazosin 4 MG tablet Commonly known as: CARDURA   Spiriva Respimat 1.25 MCG/ACT Aers Generic drug: Tiotropium Bromide Monohydrate       TAKE these medications    Adempas 1.5 MG Tabs Generic drug: Riociguat Take 1.5 mg by mouth in the morning, at noon, and at bedtime.   albuterol 108 (90 Base) MCG/ACT inhaler Commonly known as: VENTOLIN HFA Inhale 1 puff into the lungs every 4 (four) hours as needed for shortness of breath.   allopurinol 100 MG tablet Commonly known as: ZYLOPRIM Take 100 mg by mouth every morning.   apixaban 5 MG Tabs tablet Commonly known as: ELIQUIS Take 1 tablet (5 mg total) by mouth 2 (two) times daily.    Azelastine HCl 0.15 % Soln Place 2 sprays into the nose daily as needed (allergies).   Cinnamon 500 MG capsule Take 500 mg by mouth every morning.   Farxiga 10 MG Tabs tablet Generic drug: dapagliflozin propanediol Take 1 tablet (10 mg total) by mouth daily.   feeding supplement Liqd Take 237 mLs by mouth 3 (three) times daily between meals.   finasteride 5 MG tablet Commonly known as: PROSCAR Take 5 mg by mouth at bedtime.   gabapentin 100 MG capsule  Commonly known as: NEURONTIN Take 200 mg by mouth 3 (three) times daily.   ipratropium 0.03 % nasal spray Commonly known as: ATROVENT Place 1 spray into both nostrils daily as needed (allergies).   magnesium oxide 400 (240 Mg) MG tablet Commonly known as: MAG-OX Take 800 mg by mouth daily.   midodrine 5 MG tablet Commonly known as: PROAMATINE Take 3 tablets (15 mg total) by mouth 3 (three) times daily with meals. What changed: how much to take   multivitamin with minerals Tabs tablet Take 1 tablet by mouth daily. Start taking on: May 01, 2021   ondansetron 4 MG tablet Commonly known as: ZOFRAN Take 1 tablet (4 mg total) by mouth every 6 (six) hours as needed for nausea.   OXYGEN Inhale 2 L into the lungs as needed for shortness of breath.   pantoprazole 40 MG tablet Commonly known as: PROTONIX Take 40 mg by mouth every morning.   polyethylene glycol 17 g packet Commonly known as: MIRALAX / GLYCOLAX Take 17 g by mouth daily. Start taking on: May 01, 2021   Potassium Chloride ER 20 MEQ Tbcr Take 20 mEq by mouth daily. What changed:  medication strength when to take this   senna-docusate 8.6-50 MG tablet Commonly known as: Senokot-S Take 1 tablet by mouth daily. Start taking on: May 01, 2021   Torsemide 60 MG Tabs Take 60 mg by mouth daily. Start taking on: May 01, 2021 What changed:  medication strength See the new instructions.   umeclidinium-vilanterol 62.5-25 MCG/INH  Aepb Commonly known as: ANORO ELLIPTA Inhale 1 puff into the lungs daily. Start taking on: May 01, 2021               Durable Medical Equipment  (From admission, onward)           Start     Ordered   04/30/21 1002  For home use only DME 3 n 1  Once        04/30/21 1001   04/30/21 1000  For home use only DME lightweight manual wheelchair with seat cushion  Once       Comments: Patient suffers from CHF, COPD, hx knee replacementg which impairs their ability to perform daily activities like bathing, dressing, grooming, and toileting in the home.  A cane or walker will not resolve  issue with performing activities of daily living. A wheelchair will allow patient to safely perform daily activities. Patient is not able to propel themselves in the home using a standard weight wheelchair due to endurance and general weakness. Patient can self propel in the lightweight wheelchair. Length of need Lifetime. Accessories: elevating leg rests (ELRs), wheel locks, extensions and anti-tippers, back cushion   04/30/21 1001   04/29/21 1012  For home use only DME Hospital bed  Once       Question Answer Comment  Length of Need 6 Months   Patient has (list medical condition): chronic heart failure   The above medical condition requires: Patient requires the ability to reposition frequently   Bed type Semi-electric   Support Surface: Gel Overlay      04/29/21 1012            Follow-up Information     [facility] Follow up on 05/07/2021.   Specialty: Cardiology Why: [facility] at [facility] 11 am Entrance C, Garage Code 3354 Contact information: [address] [id number] [place] [phone]  [name], DO. Call.   Specialty: Family Medicine Why: FOllow up within 1-2 weeks Contact information: [address] [place] [address] [phone]          Sherren Mocha, MD .   Specialty: Cardiology Contact information: [address] [place] [address] [phone]         [name], MD Follow up.   Specialty: Pulmonary Disease Why: Follow up within 1 week Contact information: [place] [address] [phone]         [facility]. Call.   Why: Follow up within 1-2 weeks Contact information: [place] [address] [phone]               Allergies  Allergen Reactions   Other Swelling    Beer- Swelling    Sunflower Oil Swelling   Sulfa Antibiotics Rash    Consultations: Pulmonary/PCCM Cardiology Palliative care medicine  Procedures/Studies: CT ABDOMEN PELVIS WO CONTRAST  Result Date: 04/19/2021 CLINICAL DATA:  Concern for bowel obstruction, vomiting for 2 days EXAM: CT ABDOMEN AND PELVIS WITHOUT CONTRAST TECHNIQUE: Multidetector CT imaging of the abdomen and pelvis was performed following the standard protocol without IV contrast. COMPARISON:  None. FINDINGS: Lower chest: There is a trace right pleural effusion with bibasilar subsegmental atelectasis. The heart is enlarged. Coronary artery calcifications are noted. There is trace pericardial fluid. Hepatobiliary: The liver is within normal limits, within the confines of noncontrast technique. Pancreas: The pancreas is atrophic but otherwise unremarkable. There is no focal lesion or contour abnormality. There is no main pancreatic ductal dilatation or peripancreatic inflammatory change. Spleen: Unremarkable. Adrenals/Urinary Tract: The adrenals are unremarkable. There is a 2.2 cm left lower pole renal cyst. There are no other focal lesions. There are no stones. There is no hydronephrosis or hydroureter. The bladder wall is thickened, likely reflecting chronic outlet obstruction. Stomach/Bowel: There is a moderate hiatal hernia. The stomach is otherwise  unremarkable. There is dilation of the third portion of the duodenum with marked narrowing as the duodenum passes between the aneurysmal aorta and the SMV. Otherwise, there is no evidence of bowel obstruction. There is no evidence of bowel wall thickening or inflammatory change. The appendix is normal. There are scattered colonic diverticula without evidence of acute diverticulitis. Vascular/Lymphatic: There is extensive calcified atherosclerotic plaque throughout the arterial vasculature. The infrarenal abdominal aorta is aneurysmal measuring up to 3.5 cm. There is no evidence of rupture. There is no abdominal or pelvic lymphadenopathy. Reproductive: The prostate is enlarged measuring up to 6.4 cm and impresses upon the inferior aspect of the bladder. Other: There is scattered fluid in the abdomen measuring greater than simple fluid attenuation, predominantly around the liver tip and spleen. There is no free intraperitoneal air. There is a small umbilical hernia containing a loop of unobstructed small bowel. Musculoskeletal: There is mild multilevel degenerative change of the spine. There is no acute osseous abnormality or aggressive osseous lesion. IMPRESSION: 1. Abrupt narrowing of the third portion of the duodenum as it passes between the aneurysmal abdominal aorta and SMV with upstream dilation is suggestive of nutcracker phenomenon which may account for the patient's symptoms. No evidence of high-grade bowel obstruction. 2. Scattered ascites throughout the abdomen measuring greater than simple fluid attenuation. This may reflect proteinaceous debris; however, hemoperitoneum is not excluded. No source of bleeding is identified. 3. Infrarenal abdominal aortic aneurysm measuring up to 3.5 cm without evidence  of rupture. Recommend follow-up ultrasound every 2 years. This recommendation follows ACR consensus guidelines: White Paper of the ACR Incidental Findings Committee II on Vascular Findings. J Am Coll Radiol  2013; 10:789-794. 4. Small ventral abdominal hernia containing a loop of nonobstructed small bowel. 5. Moderate-sized hiatal hernia. 6. Thickened bladder wall likely reflecting chronic outlet obstruction from the enlarged prostate. 7. Cardiomegaly. Electronically Signed   By: Valetta Mole M.D.   On: 04/19/2021 14:24   DG Chest 1 View  Result Date: 04/24/2021 CLINICAL DATA:  Central line placement EXAM: CHEST  1 VIEW COMPARISON:  Chest radiograph obtained earlier the same day FINDINGS: There is a new right IJ central venous catheter with the tip terminating in the mid SVC. The heart is markedly enlarged, unchanged. There is enlargement of the central pulmonary vasculature. There is no new focal airspace disease. There is no significant pleural effusion. There is no pneumothorax. There is no acute osseous abnormality. IMPRESSION: New right IJ central venous catheter with the tip terminating in the mid SVC. Electronically Signed   By: Valetta Mole M.D.   On: 04/24/2021 12:45   DG Chest 2 View  Result Date: 04/12/2021 CLINICAL DATA:  Shortness of breath EXAM: CHEST - 2 VIEW COMPARISON:  Chest radiograph 01/03/2021, chest CT 12/02/2020 FINDINGS: Unchanged, enlarged cardiac silhouette with prominent pulmonary arteries. There is no focal airspace consolidation. There are changes of emphysema. Prominent pulmonary vasculature. No large pleural effusion or visible pneumothorax. No acute osseous abnormality. Thoracic spondylosis IMPRESSION: Cardiomegaly with pulmonary vascular congestion. Electronically Signed   By: Maurine Simmering M.D.   On: 04/12/2021 15:11   DG Abd 1 View  Result Date: 04/24/2021 CLINICAL DATA:  Rule out obstruction EXAM: ABDOMEN - 1 VIEW COMPARISON:  CT 04/19/2021.  Upper GI 04/20/2021 FINDINGS: Contrast material is seen within the colon which is decompressed. Mild gaseous distention of central bowel loops, likely small bowel loops. Cannot exclude partial small bowel obstruction. No organomegaly or  free air. IMPRESSION: Contrast material within the colon. Mildly prominent central small bowel loops. Findings may reflect partial small bowel obstruction. Electronically Signed   By: Rolm Baptise M.D.   On: 04/24/2021 03:55   US RENAL  Result Date: 04/19/2021 CLINICAL DATA:  Acute kidney injury EXAM: RENAL / URINARY TRACT ULTRASOUND COMPLETE COMPARISON:  CT 04/19/2021 FINDINGS: Right Kidney: Renal measurements: 10.4 x 5.4 x 5.3 cm = volume: 155 mL. Echogenicity within normal limits. No mass, shadowing stone, or hydronephrosis visualized. Left Kidney: Renal measurements: 10.9 x 5.5 x 5.9 cm = volume: 185 mL. Echogenicity within normal limits. 2.8 cm cyst within the interpolar region of the left kidney. No shadowing stone or hydronephrosis visualized. Bladder: Diffuse urinary bladder wall thickening. Other: Prostatomegaly. IMPRESSION: 1. No renal stone or hydronephrosis. 2. Prostatomegaly with diffuse urinary bladder wall thickening, which may be related to chronic outlet obstruction. Electronically Signed   By: Davina Poke D.O.   On: 04/19/2021 17:40   DG CHEST PORT 1 VIEW  Result Date: 04/24/2021 CLINICAL DATA:  Weakness, hypotension, tachycardia, nausea, vomiting EXAM: PORTABLE CHEST 1 VIEW COMPARISON:  None. FINDINGS: Opacification of the left lung base likely relates to underpenetration. The lungs are clear. No pneumothorax or pleural effusion. Cardiomegaly is stable. The central pulmonary arteries are enlarged in keeping with changes of pulmonary arterial hypertension. No overt pulmonary edema. No acute bone abnormality. IMPRESSION: No active disease. Stable cardiomegaly. Stable enlargement of the central pulmonary arteries. Electronically Signed   By: Linwood Dibbles.D.  On: 04/24/2021 01:50   DG CHEST PORT 1 VIEW  Result Date: 04/22/2021 CLINICAL DATA:  Patient with weakness.  Shortness of breath. EXAM: PORTABLE CHEST 1 VIEW COMPARISON:  Chest radiograph 04/19/2021 FINDINGS: Monitoring leads  overlie the patient. Cardiomegaly. Interval development of diffuse bilateral airspace opacities. No pleural effusion or pneumothorax. IMPRESSION: New diffuse bilateral airspace opacities may represent edema or multifocal infection. Electronically Signed   By: Lovey Newcomer M.D.   On: 04/22/2021 11:49   DG Chest Portable 1 View  Result Date: 04/19/2021 CLINICAL DATA:  Vomiting.  Evaluate for aspiration EXAM: PORTABLE CHEST 1 VIEW COMPARISON:  04/12/2021 FINDINGS: New chronic cardiomegaly and vascular pedicle widening. Interstitial coarsening at the bases which is similar to prior. No focal infiltrate, effusion, or air leak. IMPRESSION: 1. No focal opacity to confirm aspiration. 2. Increased interstitial coarsening which could be from portable technique or mild edema. Electronically Signed   By: Monte Fantasia M.D.   On: 04/19/2021 10:02   DG UGI W SINGLE CM (SOL OR THIN BA)  Result Date: 04/20/2021 CLINICAL DATA:  Evaluate for intestinal stricture.  Abnormal CT. EXAM: UPPER GI SERIES WITH KUB TECHNIQUE: After obtaining a scout radiograph a routine upper GI series was performed using thin barium FLUOROSCOPY TIME:  Fluoroscopy Time:  2 minutes 18 seconds Radiation Exposure Index (if provided by the fluoroscopic device): 31.2 mGy Number of Acquired Spot Images: 0 COMPARISON:  CT AP 04/19/2021. FINDINGS: The esophagus appears patent. No stricture or mass identified. There is a moderate size hiatal hernia. The stomach appears nondistended. Increased caliber of the duodenum is identified. Contrast however passes through the horizontal portion to the ligament of Treitz and into the mildly dilated proximal small bowel loops without signs of duodenal stricture. IMPRESSION: 1. No signs of significant duodenal narrowing/stricture. 2. Increased caliber of the duodenum is noted to the level of the ligament of Treitz. However, the proximal jejunal loops are also mildly increased in caliber. Findings may reflect sequelae of  mild low-grade partial small-bowel obstruction or proximal small bowel ileus. Electronically Signed   By: Kerby Moors M.D.   On: 04/20/2021 15:13   [place] EKG SITE RITE  Result Date: 04/24/2021 If Site Rite image not attached, placement could not be confirmed due to current cardiac rhythm.  US Abdomen Limited RUQ (LIVER/GB)  Result Date: 04/19/2021 CLINICAL DATA:  Transaminitis EXAM: ULTRASOUND ABDOMEN LIMITED RIGHT UPPER QUADRANT COMPARISON:  CT 04/19/2021 FINDINGS: Gallbladder: Surgically absent Common bile duct: Diameter: 4.3 mm Liver: No focal lesion identified. Within normal limits in parenchymal echogenicity. Portal vein is patent on color Doppler imaging with normal direction of blood flow towards the liver. Other: Trace ascites adjacent to the liver IMPRESSION: Status post cholecystectomy.  Small amount of perihepatic ascites Electronically Signed   By: Donavan Foil M.D.   On: 04/19/2021 22:48     Subjective: Seen and examined at bedside and he is doing relatively well.  No chest pain or shortness of breath.  And oxygen was weaned.  He felt well and stable to go home.  No other concerns or complaints at this time and all questions were answered to the patient's and the patient's daughter satisfaction.  Discharge Exam: Vitals:   04/30/21 1600 04/30/21 1700  BP: 104/72 109/82  Pulse:    Resp: 17 12  Temp:    SpO2:     Vitals:   04/30/21 1500 04/30/21 1543 04/30/21 1600 04/30/21 1700  BP: (!) 88/60  104/72 109/82  Pulse:  Resp: _0 Temp:  98.4 F (36.9 C)    TempSrc:  Oral    SpO2:      Weight:      Height:       General: Pt is alert, awake, not in acute distress Cardiovascular: RRR, S1/S2 +, no rubs, no gallops Respiratory: Diminished bilaterally, no wheezing, no rhonchi; Unlabored breathing but wearing supplemental O2 via [place] Abdominal: Soft, NT, Distended, bowel sounds + Extremities: 1+ edema, no cyanosis  The results of significant diagnostics from this  hospitalization (including imaging, microbiology, ancillary and laboratory) are listed below for reference.    Microbiology: Recent Results (from the past 240 hour(s))  Culture, blood (routine x 2)     Status: None   Collection Time: 04/24/21  8:58 AM   Specimen: BLOOD LEFT HAND  Result Value Ref Range Status   Specimen Description BLOOD LEFT HAND  Final   Special Requests   Final    BOTTLES DRAWN AEROBIC AND ANAEROBIC Blood Culture adequate volume   Culture   Final    NO GROWTH 5 DAYS Performed at [facility], [address] [place], [place] [address]    Report Status 04/29/2021 FINAL  Final  Culture, blood (routine x 2)     Status: None   Collection Time: 04/24/21  8:58 AM   Specimen: BLOOD LEFT FOREARM  Result Value Ref Range Status   Specimen Description BLOOD LEFT FOREARM  Final   Special Requests   Final    BOTTLES DRAWN AEROBIC AND ANAEROBIC Blood Culture adequate volume   Culture   Final    NO GROWTH 5 DAYS Performed at [facility], [place] [address], [place], [place] [address]    Report Status 04/29/2021 FINAL  Final  Expectorated Sputum Assessment w Gram Stain, Rflx to Resp Cult     Status: None   Collection Time: 04/24/21 11:01 AM   Specimen: Expectorated Sputum  Result Value Ref Range Status   Specimen Description EXPECTORATED SPUTUM  Final   Special Requests NONE  Final   Sputum evaluation   Final    THIS SPECIMEN IS ACCEPTABLE FOR SPUTUM CULTURE Performed at [facility], [place] [address] [place], [place] [address]    Report Status 04/24/2021 FINAL  Final  Culture, Respiratory w Gram Stain     Status: None   Collection Time: 04/24/21 11:01 AM  Result Value Ref Range Status   Specimen Description EXPECTORATED SPUTUM  Final   Special Requests NONE Reflexed from [id number]  Final   Gram Stain   Final    ABUNDANT WBC PRESENT,BOTH PMN AND MONONUCLEAR FEW SQUAMOUS EPITHELIAL CELLS PRESENT FEW GRAM POSITIVE COCCI RARE GRAM VARIABLE ROD RARE YEAST     Culture   Final    Normal respiratory flora-no Staph aureus or Pseudomonas seen Performed at [facility], [place] [address] [place],  [address]    Report Status 04/26/2021 FINAL  Final  MRSA Next Gen by PCR, Nasal     Status: None   Collection Time: 04/24/21  2:00 PM   Specimen: Nasal Mucosa; Nasal Swab  Result Value Ref Range Status   MRSA by PCR Next Gen NOT DETECTED NOT DETECTED Final    Comment: (NOTE) The GeneXpert MRSA Assay (FDA approved for NASAL specimens only), is one component of a comprehensive MRSA colonization surveillance program. It is not intended to diagnose MRSA infection nor to guide or monitor treatment for MRSA infections. Test performance  is not FDA approved in patients less than 82 years old. Performed at [facility], [place] [address], [place], [place] [address]     Labs: BNP (last 3 results) Recent Labs    04/12/21 1435 04/19/21 0915 04/22/21 0958  BNP 1,597.1* 2,618.0* 8,889.1*   Basic Metabolic Panel: Recent Labs  Lab 04/25/21 0440 04/25/21 1353 04/26/21 0446 04/27/21 0113 04/28/21 0100 04/29/21 0147 04/30/21 0459 04/30/21 0808  NA 137   < > 132* 132* 133* 135 134*  --   K 3.0*   < > 3.6 4.6 4.1 4.1 5.0  --   CL 92*   < > 87* 95* 97* 96* 96*  --   CO2 33*   < > 37* 28 30 34* 28  --   GLUCOSE 109*   < > 107* 65* 86 100* 76  --   BUN 27*   < > 29* 26* 20 22 24*  --   CREATININE 1.40*   < > 1.08 1.07 0.90 1.02 1.22  --   CALCIUM 8.5*   < > 9.4 9.5 9.5 9.5 10.9*  --   MG 1.1*   < > 1.8 1.8 1.8 1.9  --  1.6*  PHOS 2.9  --   --   --   --  2.4*  --  2.8   < > = values in this interval not displayed.   Liver Function Tests: Recent Labs  Lab 04/24/21 0027 04/25/21 0440 04/28/21 0110 04/29/21 0147 04/30/21 0459  AST 173* 56* 27 30 32  ALT 459* 226* 95* 78* QUANTITY NOT SUFFICIENT, UNABLE TO PERFORM TEST  ALKPHOS 90 71 61 66 66  BILITOT 2.2* 1.9* 1.2 1.0 QUANTITY NOT SUFFICIENT, UNABLE TO PERFORM TEST  PROT 6.8 5.6*  5.9* 6.2* 5.6*  ALBUMIN 3.1* 2.4* 2.3* 2.4* 2.1*   No results for input(s): LIPASE, AMYLASE in the last 168 hours. Recent Labs  Lab 04/24/21 1121  AMMONIA 14   CBC: Recent Labs  Lab 04/26/21 0446 04/27/21 0113 04/28/21 0100 04/29/21 0147 04/30/21 0459  WBC 8.0 5.5 5.4 7.1 7.5  NEUTROABS  --   --   --  5.2 5.4  HGB 14.9 14.8 14.6 14.5 14.6  HCT 45.4 45.6 44.3 44.2 43.1  MCV 86.1 85.6 85.2 85.5 83.7  PLT 120* 106* 136* 176 161   Cardiac Enzymes: No results for input(s): CKTOTAL, CKMB, CKMBINDEX, TROPONINI in the last 168 hours. BNP: Invalid input(s): POCBNP CBG: Recent Labs  Lab 04/24/21 0856  GLUCAP 122*   D-Dimer No results for input(s): DDIMER in the last 72 hours. Hgb A1c No results for input(s): HGBA1C in the last 72 hours. Lipid Profile No results for input(s): CHOL, HDL, LDLCALC, TRIG, CHOLHDL, LDLDIRECT in the last 72 hours. Thyroid function studies No results for input(s): TSH, T4TOTAL, T3FREE, THYROIDAB in the last 72 hours.  Invalid input(s): FREET3 Anemia work up No results for input(s): VITAMINB12, FOLATE, FERRITIN, TIBC, IRON, RETICCTPCT in the last 72 hours. Urinalysis    Component Value Date/Time   COLORURINE AMBER (A) 04/24/2021 1034   APPEARANCEUR CLEAR 04/24/2021 1034   LABSPEC 1.010 04/24/2021 1034   PHURINE 5.0 04/24/2021 1034   GLUCOSEU NEGATIVE 04/24/2021 1034   HGBUR SMALL (A) 04/24/2021 1034   BILIRUBINUR NEGATIVE 04/24/2021 1034   KETONESUR NEGATIVE 04/24/2021 1034   PROTEINUR NEGATIVE 04/24/2021 1034   UROBILINOGEN 0.2 09/08/2014 0927   NITRITE NEGATIVE 04/24/2021 1034   LEUKOCYTESUR SMALL (A) 04/24/2021 1034   Sepsis Labs Invalid input(s):  PROCALCITONIN,  WBC,  LACTICIDVEN Microbiology Recent Results (from the past 240 hour(s))  Culture, blood (routine x 2)     Status: None   Collection Time: 04/24/21  8:58 AM   Specimen: BLOOD LEFT HAND  Result Value Ref Range Status   Specimen Description BLOOD LEFT HAND  Final    Special Requests   Final    BOTTLES DRAWN AEROBIC AND ANAEROBIC Blood Culture adequate volume   Culture   Final    NO GROWTH 5 DAYS Performed at [facility], [address], [place], [place] [address]    Report Status 04/29/2021 FINAL  Final  Culture, blood (routine x 2)     Status: None   Collection Time: 04/24/21  8:58 AM   Specimen: BLOOD LEFT FOREARM  Result Value Ref Range Status   Specimen Description BLOOD LEFT FOREARM  Final   Special Requests   Final    BOTTLES DRAWN AEROBIC AND ANAEROBIC Blood Culture adequate volume   Culture   Final    NO GROWTH 5 DAYS Performed at [facility], [place] [address], [place], [place] [address]    Report Status 04/29/2021 FINAL  Final  Expectorated Sputum Assessment w Gram Stain, Rflx to Resp Cult     Status: None   Collection Time: 04/24/21 11:01 AM   Specimen: Expectorated Sputum  Result Value Ref Range Status   Specimen Description EXPECTORATED SPUTUM  Final   Special Requests NONE  Final   Sputum evaluation   Final    THIS SPECIMEN IS ACCEPTABLE FOR SPUTUM CULTURE Performed at [facility], [place] [address], [place], [place] [address]    Report Status 04/24/2021 FINAL  Final  Culture, Respiratory w Gram Stain     Status: None   Collection Time: 04/24/21 11:01 AM  Result Value Ref Range Status   Specimen Description EXPECTORATED SPUTUM  Final   Special Requests NONE Reflexed from [id number]  Final   Gram Stain   Final    ABUNDANT WBC PRESENT,BOTH PMN AND MONONUCLEAR FEW SQUAMOUS EPITHELIAL CELLS PRESENT FEW GRAM POSITIVE COCCI RARE GRAM VARIABLE ROD RARE YEAST    Culture   Final    Normal respiratory flora-no Staph aureus or Pseudomonas seen Performed at [facility], [place] [address] [place], [place] [address]    Report Status 04/26/2021 FINAL  Final  MRSA Next Gen by PCR, Nasal     Status: None   Collection Time: 04/24/21  2:00 PM   Specimen: Nasal Mucosa; Nasal Swab  Result Value Ref Range Status   MRSA  by PCR Next Gen NOT DETECTED NOT DETECTED Final    Comment: (NOTE) The GeneXpert MRSA Assay (FDA approved for NASAL specimens only), is one component of a comprehensive MRSA colonization surveillance program. It is not intended to diagnose MRSA infection nor to guide or monitor treatment for MRSA infections. Test performance is not FDA approved in patients less than 39 years old. Performed at [facility], [place] [address], [place], [place] [address]    Time coordinating discharge: 35 minutes  SIGNED:  Kerney Elbe, DO [facility] 04/30/2021, 7:36 PM Pager is on [place]  If 7PM-7AM, please contact night-coverage [email or web address]

## 2021-04-30 NOTE — Progress Notes (Signed)
Patient ID: Joshua [name], male   DOB: 01/15/1936, 85 y.o.   MRN: [id number]      Advanced Heart Failure Rounding Note  PCP-Cardiologist: Sherren Mocha, MD  HF: Dr. Aundra Dubin  Subjective:    Patient admitted 09/01 with N, V, hypotension and AKI on CKD after recent admission for a/c diastolic HF/RV failure. Gave IV fluids and Midodrine increased to 15 mg TID to support BP.  Transferred to CCU 9/6 for lethargy, fever and hypotension w/ concern for developing septic shock. Started on broad spectrum antibiotics. Now afebrile, WBCs normal. Cefepime ongoing.   Now off Norepinephrine, remains on midodrine.  BP still runs relatively low, creatinine stable 1.02.  He is back on po torsemide.   Now DNR/DNI w/ palliative care team following.   Denies SOB. Wants to walk more. Remains on 5L [place].    Objective:   Weight Range: 99.8 kg Body mass index is 27.5 kg/m.   Vital Signs:   Temp:  [97.8 F (36.6 C)-98.7 F (37.1 C)] 98.2 F (36.8 C) (09/12 0400) Pulse Rate:  [63-100] 100 (09/12 0400) Resp:  [10-20] 10 (09/12 0600) BP: (79-106)/(54-75) 92/62 (09/12 0600) SpO2:  [90 %-100 %] 100 % (09/12 0400) Last BM Date: 04/29/21  Weight change: Filed Weights   04/26/21 0500 04/26/21 0600 04/27/21 0600  Weight: 99.9 kg 99.9 kg 99.8 kg    Intake/Output:   Intake/Output Summary (Last 24 hours) at 04/30/2021 0756 Last data filed at 04/30/2021 0600 Gross per 24 hour  Intake 1010.03 ml  Output 2025 ml  Net -1014.97 ml      Physical Exam    General: NAD Neck: JVP 7-8 cm, no thyromegaly or thyroid nodule.  Lungs: Clear to auscultation bilaterally with normal respiratory effort. CV: Nondisplaced PMI.  Heart regular S1/S2, no S3/S4, no murmur.  1+ ankle edema.   Abdomen: Soft, nontender, no hepatosplenomegaly, no distention.  Skin: Intact without lesions or rashes.  Neurologic: Alert and oriented x 3.  Psych: Normal affect. Extremities: No clubbing or cyanosis.  HEENT: Normal.     Telemetry  SR 80-90s   Labs    CBC Recent Labs    04/28/21 0100 04/29/21 0147  WBC 5.4 7.1  NEUTROABS  --  5.2  HGB 14.6 14.5  HCT 44.3 44.2  MCV 85.2 85.5  PLT 136* 160    Basic Metabolic Panel Recent Labs    04/28/21 0100 04/29/21 0147 04/30/21 0459  NA 133* 135 134*  K 4.1 4.1 5.0  CL 97* 96* 96*  CO2 30 34* 28  GLUCOSE 86 100* 76  BUN 20 22 24*  CREATININE 0.90 1.02 1.22  CALCIUM 9.5 9.5 10.9*  MG 1.8 1.9  --   PHOS  --  2.4*  --    Liver Function Tests Recent Labs    04/29/21 0147 04/30/21 0459  AST 30 32  ALT 78* QUANTITY NOT SUFFICIENT, UNABLE TO PERFORM TEST  ALKPHOS 66 66  BILITOT 1.0 QUANTITY NOT SUFFICIENT, UNABLE TO PERFORM TEST  PROT 6.2* 5.6*  ALBUMIN 2.4* 2.1*    No results for input(s): LIPASE, AMYLASE in the last 72 hours.  Cardiac Enzymes No results for input(s): CKTOTAL, CKMB, CKMBINDEX, TROPONINI in the last 72 hours.  BNP: BNP (last 3 results) Recent Labs    04/12/21 1435 04/19/21 0915 04/22/21 0958  BNP 1,597.1* 2,618.0* 1,156.1*    ProBNP (last 3 results) No results for input(s): PROBNP in the last 8760 hours.   D-Dimer No results for input(s): DDIMER  in the last 72 hours. Hemoglobin A1C No results for input(s): HGBA1C in the last 72 hours. Fasting Lipid Panel No results for input(s): CHOL, HDL, LDLCALC, TRIG, CHOLHDL, LDLDIRECT in the last 72 hours. Thyroid Function Tests No results for input(s): TSH, T4TOTAL, T3FREE, THYROIDAB in the last 72 hours.  Invalid input(s): FREET3  Other results:   Imaging    No results found.   Medications:     Scheduled Medications:  allopurinol  100 mg Oral q morning   apixaban  5 mg Oral BID   bisacodyl  10 mg Rectal Daily   Chlorhexidine Gluconate Cloth  6 each Topical Daily   feeding supplement  237 mL Oral TID BM   finasteride  5 mg Oral QHS   gabapentin  200 mg Oral TID   metoCLOPramide (REGLAN) injection  10 mg Intravenous Q8H   midodrine  15 mg Oral  TID WC   multivitamin with minerals  1 tablet Oral Daily   pantoprazole  40 mg Oral q morning   polyethylene glycol  17 g Oral Daily   Riociguat  1.5 mg Oral TID   senna-docusate  1 tablet Oral Daily   sodium chloride flush  3 mL Intravenous Q12H   sorbitol  60 mL Oral Q0600   torsemide  60 mg Oral Daily   umeclidinium-vilanterol  1 puff Inhalation Daily    Infusions:  sodium chloride Stopped (04/30/21 0552)   ceFEPime (MAXIPIME) IV 200 mL/hr at 04/30/21 0600    PRN Medications: sodium chloride, acetaminophen (TYLENOL) oral liquid 160 mg/5 mL, albuterol, baclofen, calcium carbonate, ipratropium, metoCLOPramide (REGLAN) injection, ondansetron **OR** ondansetron (ZOFRAN) IV, sodium chloride flush   Assessment/Plan  1.  Shock: Suspect mixed septic and cardiogenic (baseline RV failure).  Suspect possible gut translocation with ileus/partial SBO. Cultures negative, afebrile with normal WBCs.  BP still runs relatively low but creatinine stable at 1.22 and no lightheadedness.  - Continue cefepime until 04/30/21 (7 days).  - Remains off norepinephrine.   - Continue midodrine 15 mg tid.  2. Chronic HFpEF/RV failure: Echo (2/22) with EF 55-60%, IV septum flattened, severe RV enlargement, severely decreased RV function, PASP 57 mmHg.  He has end-stage RV failure.  Diuretics initially held this admission with over-diuresis and septic shock.  Back on po torsemide.  - Would send home on torsemide 60 mg daily and KCl 20 daily.  - Continue Midodrine 15 mg TID - TED hose 3. Pulmonary HTN: PAH noted on 4/22 RHC with PVR 6.1 WU.  This appears to be multifactorial with OSA, severe emphysema, and a suspected chronic PE involving the right middle lobe (group 3 and group 4 PH). Given the suspected mixed etiology with only 1 area of chronic thromboembolism (right middle lobe) as well as age, do not think that pulmonary thromboendarterectomy would be indicated.  Rheumatologic serologic workup was negative.  PFTs  showed severe obstruction and moderate restriction, suggesting significant COPD. - Continue riociguat for CTEPH, 1.5 mg bid.  - c/w supp O2, on chronic 02 at home.  I decreased to 3L by [place] this morning and he seems to be tolerating.  4. AKI on Stage III CKD: Baseline SCr ~1.6. Scr 1.47 at d/c on 08/30. Up to 3.69 on admit -> 1.77 -> 1.07 -> 0.9 today.  Likely AKI due to hypovolemia and RV failure with nausea/vomiting poor po intake and ongoing diuretic use + septic shock  5. Elevated troponin:  HS trop 247 > 499 > 717 > 893>102. No chest pain.  Likely due to demand ischemia from hypovolemia/low BP.  6. Transaminitis/shock liver from hypotension: Improving. No significant abnormality identified on RUQ [place]. Small amount of perihepatic ascites. Ammonia level ok at 14 (9/6) 7. Ileus/partial SBO:  CT abdomen 04/19/21 with narrowing distal third duodenum with upstream dilatation. 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 Suspect ileus versus partial SBO, not certain this is due to the "nutcracker phenomenon" that has been there for a long time.  Has bowel sounds. No aspiration noted on swallow evaluation.  - has been seen by [facility]. Not surgical candidate.  - Currently tolerating a solid diet.   8. OSA: Untreated.  Insurance would apparently not cover home sleep study.  Need to address this as outpatient. Can he get home SS (wants this) versus lab sleep study? 9. Emphysema: Prior smoker.  Emphysema on CT and severe obstruction on PFTs - He uses home oxygen, decreased to 3L this morning.   10. Chronic PE: Continue apixaban. No further hemoptysis 11. BPH with urinary retention: He has had issues with this in the past, often self caths 3-4x/day. Concern for hygiene? Placed foley. - On Flomax.  - Had outpatient f/u scheduled but missed d/t admission 12. Hiccups: Have been intractable.   - On gabapentin, Reglan.  - Baclofen helped.  13.  Hypokalemia/ Hypomagnesemia: Stable.  48. GOC   DNR/DNI   We will arrange followup in CHF clinic.  Recommended cardiac meds for home: torsemide 60 mg daily, KCl 20 daily, midodrine 15 mg tid, riociguat 1.5 tid, apixaban 5 bid.    Loralie Champagne 04/30/2021 7:56 AM

## 2021-04-30 NOTE — Consult Note (Signed)
[facility] CM Inpatient Consult   04/30/2021  Joshua [name] 10-23-1935 [id number]  [facility]] [name]:  Medicare CMS DCE  Primary Care Provider:  [name], [facility]  [name] screened for length of stay with a noted discharge order.  Reviewed for hospitalization with noted high risk score for unplanned readmission risk and  to assess for potential [facility] service needs for post hospital transition.  Review of [name]'s medical record  from palliative care consult noted as well.  Spoke with inpatient [facility] AHF RN CM regarding post hospital support needs. Spoke with [name] and daughter, Joshua [name] at the bedside.  [name] up in chair and states he is feeling pretty good and ready to transition home.  Explained [place] telephonic RN follow up for high risk patients, [name] given a [facility] folder with information.  [name] encouraged to look it over when he has a chance currently getting ready to go home.   Plan:  Will assign to [place] Coordinator for post hospital follow up.   For questions contact:   Natividad Brood, RN BSN [facility] Liaison  [phone] business mobile phone Toll free office [phone]  Fax number: [phone] [email or web address]

## 2021-04-30 NOTE — TOC CM/SW Note (Signed)
HF TOC CM received confirmation that DME will be delivered around 330 pm. Jonnie Finner RN3 CCM, Heart Failure TOC CM [phone]

## 2021-04-30 NOTE — Progress Notes (Signed)
Physical Therapy Treatment Patient Details Name: Joshua [name] MRN: [id number] DOB: 07-30-1936 Today's Date: 04/30/2021   History of Present Illness Pt is an 85 y.o. male admitted 04/19/21 with intractable nausea/vomiting. CT of abdomen/pelvis showed narrowing of duodenum and SMV with upstream dilation suggestive of nutcracker phenomenon. Course complicated by AKI, hypotension; transfer to ICU 9/6 for vasopressors. Of note, recent admission 04/12/21-04/17/21 for CHF. Other PMH includes COPD (on 2L O2 baseline), pulmonary HTN, CKD, PE on Eliquis, L TKA.    PT Comments    The pt was seen for continued progression of OOB mobility and education in preparation for d/c home. The pt continues to demo great stability and power to complete sit-stand transfers without need for UE support and can complete standing peri-care without assist or UE support for balance. The pt was then able to complete x4 stairs followed by 75 ft of ambulation (and more after PT session with OT) with single UE support only and all VSS. The pt reports he is mobilizing at his baseline gait speed, and had no overt LOB with ambulation or stairs. The pt and his daughter were educated on safety and technique with stairs at home, as well as energy conservation strategies for safely managing mobility with activity at home. The pt and his family are in agreement with plan as detailed below.     Recommendations for follow up therapy are one component of a multi-disciplinary discharge planning process, led by the attending physician.  Recommendations may be updated based on patient status, additional functional criteria and insurance authorization.  Follow Up Recommendations  Home health PT;Supervision for mobility/OOB     Equipment Recommendations  None recommended by PT    Recommendations for Other Services       Precautions / Restrictions Precautions Precautions: Fall Precaution Comments: Watch SpO2 (wears 2L O2  baseline) Restrictions Weight Bearing Restrictions: No     Mobility  Bed Mobility Overal bed mobility: Modified Independent             General bed mobility comments: Received sitting in recliner    Transfers Overall transfer level: Needs assistance Equipment used: None Transfers: Sit to/from Stand Sit to Stand: Supervision         General transfer comment: pt completed multiple sit-stand trasnfers without need for assist or cueing for hand placement  Ambulation/Gait Ambulation/Gait assistance: Supervision Gait Distance (Feet): 75 Feet (+ 75 ft) Assistive device: None;IV Pole Gait Pattern/deviations: Step-through pattern;Decreased stride length Gait velocity: 0.3 m/s Gait velocity interpretation: <1.31 ft/sec, indicative of household ambulator General Gait Details: pt with slight trunk flexion, able to complete short bout of ambulation in the room with no AD, but pt reports increased comfort with single UE support and therefore used IV pole for remainder of hallway ambulation. no LOB, pt reports he is moving at normal gait speed. VSS   Stairs Stairs: Yes Stairs assistance: Min guard Stair Management: Forwards;One rail Left;Step to pattern Number of Stairs: 4 General stair comments: the pt completed 4 steps down and then back up with minG for safety, use of single rail. pt with no LOB, VSS at this time. discussed plan to stay downstairs, but with chairs placed for rest breaks if going upstairs. Pt and daughter in agreement      Balance Overall balance assessment: Needs assistance Sitting-balance support: Feet supported;No upper extremity supported Sitting balance-Leahy Scale: Good     Standing balance support: No upper extremity supported;During functional activity Standing balance-Leahy Scale: Fair Standing balance comment: able  to static stand and ambulate short distances without AD or UE support, pt reports improved comfort with UE support                             Cognition Arousal/Alertness: Awake/alert Behavior During Therapy: WFL for tasks assessed/performed Overall Cognitive Status: Within Functional Limits for tasks assessed                                           General Comments General comments (skin integrity, edema, etc.): VSS on 3L O2      Pertinent Vitals/Pain Pain Assessment: No/denies pain Pain Intervention(s): Monitored during session     PT Goals (current goals can now be found in the care plan section) Acute Rehab PT Goals Patient Stated Goal: to be able to go home PT Goal Formulation: With patient Time For Goal Achievement: 05/05/21 Potential to Achieve Goals: Good Progress towards PT goals: Progressing toward goals    Frequency    Min 3X/week      PT Plan Current plan remains appropriate       AM-PAC PT "6 Clicks" Mobility   Outcome Measure  Help needed turning from your back to your side while in a flat bed without using bedrails?: None Help needed moving from lying on your back to sitting on the side of a flat bed without using bedrails?: None Help needed moving to and from a bed to a chair (including a wheelchair)?: A Little Help needed standing up from a chair using your arms (e.g., wheelchair or bedside chair)?: A Little Help needed to walk in hospital room?: A Little Help needed climbing 3-5 steps with a railing? : A Little 6 Click Score: 20    End of Session Equipment Utilized During Treatment: Gait belt;Oxygen Activity Tolerance: Patient tolerated treatment well Patient left: in chair;with call bell/phone within reach;with family/visitor present Nurse Communication: Mobility status PT Visit Diagnosis: Unsteadiness on feet (R26.81);Muscle weakness (generalized) (M62.81)     Time: 2840-6986 PT Time Calculation (min) (ACUTE ONLY): 25 min  Charges:  $Gait Training: 8-22 mins $Self Care/Home Management: 8-22                     [place], PT, DPT    Acute Rehabilitation Department Pager #: [phone]   Sandra Cockayne 04/30/2021, 1:31 PM

## 2021-05-02 ENCOUNTER — Other Ambulatory Visit: Payer: Self-pay

## 2021-05-02 DIAGNOSIS — I272 Pulmonary hypertension, unspecified: Secondary | ICD-10-CM | POA: Diagnosis not present

## 2021-05-02 DIAGNOSIS — I50812 Chronic right heart failure: Secondary | ICD-10-CM | POA: Diagnosis not present

## 2021-05-02 DIAGNOSIS — N1831 Chronic kidney disease, stage 3a: Secondary | ICD-10-CM

## 2021-05-02 DIAGNOSIS — J449 Chronic obstructive pulmonary disease, unspecified: Secondary | ICD-10-CM | POA: Diagnosis not present

## 2021-05-02 DIAGNOSIS — I2699 Other pulmonary embolism without acute cor pulmonale: Secondary | ICD-10-CM | POA: Diagnosis not present

## 2021-05-02 DIAGNOSIS — I5032 Chronic diastolic (congestive) heart failure: Secondary | ICD-10-CM

## 2021-05-02 DIAGNOSIS — N170 Acute kidney failure with tubular necrosis: Secondary | ICD-10-CM

## 2021-05-02 DIAGNOSIS — J9611 Chronic respiratory failure with hypoxia: Secondary | ICD-10-CM

## 2021-05-02 DIAGNOSIS — I110 Hypertensive heart disease with heart failure: Secondary | ICD-10-CM | POA: Diagnosis not present

## 2021-05-03 DIAGNOSIS — N401 Enlarged prostate with lower urinary tract symptoms: Secondary | ICD-10-CM | POA: Diagnosis not present

## 2021-05-03 DIAGNOSIS — R351 Nocturia: Secondary | ICD-10-CM | POA: Diagnosis not present

## 2021-05-04 ENCOUNTER — Telehealth: Payer: Self-pay | Admitting: Pulmonary Disease

## 2021-05-04 NOTE — Telephone Encounter (Signed)
RA pt stated that when he was last in the hospital you spoke with him about getting a scooter to use.  He stated that he would like this ordered.   He was last seen in the office on 03/07/21 and he has a HFU on 09/22 with EW.  Thanks

## 2021-05-05 NOTE — Progress Notes (Signed)
PCP: [name], DO Cardiology: Dr. Burt Knack HF Cardiology: Dr. Aundra Dubin  85 y.o. with history of HFPEF, prior PE, and pulmonary hypertension was referred by Dr. Gasper Sells for evaluation of pulmonary hypertension.  Patient had a PE in 2016.  He is on apixaban. He has a long history of diastolic CHF.  Most recent echo showed a significant component of RV failure with EF 55-60%, IV septum flattened, severe RV enlargement, severely decreased RV function, PASP 57 mmHg.  CTA chest in 4/22 showed no acute PE and mild emphysema, but V/Q scan in 5/22 was suggestive of chronic PE in the right middle lobe.  RHC in 4/22 showed normal filling pressures with moderate PAH. He additionally has chronic hiccups followed by Dr. Benson Norway, now on gabapentin. PFTs in 6/22 showed mixed picture with severe obstruction, moderate restriction, and severely decreased DLCO.   At follow up visits, he was overloaded and we increased his diuretic, placed unna boots, started midodrine and placed a Zio patch to quantify any arrhythmia that could be responsible for his syncope. He continued with light headedness and volume overload and subsequent visit and arranged for [facility] to give lasix 80 mg IV x 2 days.  Admitted 8/25-8/30/22 with A/C HF exacerbation. He was aggressively diuresed with lasix/metolazone, had unna boots, farxiga added.  Midodrine was increased for hypotension. Hospitalization c/b AKI on CKD III.  Readmitted 9/1-9/12/22 with shock (mixed septic and cardiogenic), likely 2/2 to aspiration pneumonia and A/C CHF.  Suspect possible gut translocation with ileus/partial SBO; CT abdomen with "nutcracker phenomenon".  He was started on NE + antibiotics. General surgery consulted and felt not to be a surgical candidate. Diuretics initially held due to over-diuresis and shock. Eventually able to wean pressors off, restart PO torsemide and midodrine. Hospitalization c/b transaminitis, delirium, and BPH with urinary retention, requiring  foley catheter. Palliative care was consulted for [place] and patient DNR/DNI. PT/OT recommended HH. He was discharged home, weight 220 lbs.  Today he returns for HF follow up with his brother and daughter. He is losing weight but says his appetite is better, now eating three times a day. Still very weak, gets SOB with walking very short distances. Continues with continuous home oxygen 2L. Some dizziness with walking, avoiding stairs. Denies CP, edema, or PND/Orthopnea. Weight at home 208 pounds. Taking all medications.   ECG (personally reviewed): SR with RBBB  Labs (4/22): K 4.8, creatinine 1.67 Labs (6/22): K 3.8, creatinine 1.72, ANA negative, RF 39.6 but CCP negative, SCL-70 negative, BNP 1231, HIV negative Labs (7/22): K 3.8, creatine 1.67 Labs (8/22): K 3.6, creatinine 1.6, hgb 15.3  Labs (9/22): K 5.0, creatinine 1.2, hgb 14.6  6 minute walk (7/22): 92 m  PMH: 1. HFPEF: With prominent RV failure.   - Echo (2/22): EF 55-60%, IV septum flattened, severe RV enlargement, severely decreased RV function, PASP 57 mmHg.  2. Venous thromboembolic disease: PE in 7425.   - Venous dopplers (4/22): No DVT.  - CTA chest (4/22): No PE.  - V/Q scan 5/22 with perfusion defect in the RML consistent with chronic PE.  3. OSA: Does not tolerate CPAP.  4. HTN 5. COPD: Prior smoker.   - CTA chest in 4/22 showed no PE, mild emphysema.  - PFTs (6/22) with severe obstruction, moderate restriction, severely decreased DLCO 6. Pulmonary hypertension: RHC (4/22) with mean RA 5, PA 65/19 mean 36, mean PCWP 5, CI 2.19, PVR 6.1 WU, PAPi 9.2.  7. Chronic hiccups 8. BPH: Has to in and out  cath at times. 9. Syncope: Zio 2 week mostly SR, BBB/IVCD present, 2 episodes of VT occurred, fastest 10 beats w/ max rate of 160 bpm, 17 SVT runs with fastest interval 9.7 seconds with max rate of 218   Social History   Socioeconomic History   Marital status: Married    Spouse name: Not on file   Number of children: Not on  file   Years of education: Not on file   Highest education level: Not on file  Occupational History   Not on file  Tobacco Use   Smoking status: Former    Packs/day: 1.00    Years: 50.00    Pack years: 50.00    Types: Cigarettes    Start date: 59    Quit date: 08/19/2006    Years since quitting: 14.7   Smokeless tobacco: Never  Vaping Use   Vaping Use: Never used  Substance and Sexual Activity   Alcohol use: Not Currently    Alcohol/week: 6.0 standard drinks    Types: 6 Shots of liquor per week   Drug use: No   Sexual activity: Not on file  Other Topics Concern   Not on file  Social History Narrative   Not on file   Social Determinants of Health   Financial Resource Strain: Not on file  Food Insecurity: Not on file  Transportation Needs: Not on file  Physical Activity: Not on file  Stress: Not on file  Social Connections: Not on file  Intimate Partner Violence: Not on file   Family History  Problem Relation Age of Onset   Heart attack Brother 35   ROS: All systems reviewed and negative except as per HPI.   Current Outpatient Medications  Medication Sig Dispense Refill   albuterol (VENTOLIN HFA) 108 (90 Base) MCG/ACT inhaler Inhale 1 puff into the lungs every 4 (four) hours as needed for shortness of breath.     allopurinol (ZYLOPRIM) 100 MG tablet Take 100 mg by mouth every morning.     apixaban (ELIQUIS) 5 MG TABS tablet Take 1 tablet (5 mg total) by mouth 2 (two) times daily. 60 tablet    Azelastine HCl 0.15 % SOLN Place 2 sprays into the nose daily as needed (allergies).     dapagliflozin propanediol (FARXIGA) 10 MG TABS tablet Take 1 tablet (10 mg total) by mouth daily. 30 tablet 0   finasteride (PROSCAR) 5 MG tablet Take 5 mg by mouth at bedtime.     gabapentin (NEURONTIN) 100 MG capsule Take 200 mg by mouth 3 (three) times daily.     ipratropium (ATROVENT) 0.03 % nasal spray Place 1 spray into both nostrils daily as needed (allergies).     magnesium oxide  (MAG-OX) 400 (240 Mg) MG tablet Take 800 mg by mouth daily.     midodrine (PROAMATINE) 5 MG tablet Take 3 tablets (15 mg total) by mouth 3 (three) times daily with meals. 270 tablet 0   Multiple Vitamin (MULTIVITAMIN WITH MINERALS) TABS tablet Take 1 tablet by mouth daily. 30 tablet 0   OXYGEN Inhale 2 L into the lungs as needed for shortness of breath.     pantoprazole (PROTONIX) 40 MG tablet Take 40 mg by mouth every morning.     potassium chloride 20 MEQ TBCR Take 20 mEq by mouth daily. 30 tablet 0   Riociguat (ADEMPAS) 1.5 MG TABS Take 1.5 mg by mouth in the morning, at noon, and at bedtime. 90 tablet 0   torsemide 60 MG  TABS Take 60 mg by mouth daily. 30 tablet 0   umeclidinium-vilanterol (ANORO ELLIPTA) 62.5-25 MCG/INH AEPB Inhale 1 puff into the lungs daily. 60 each 0   Cinnamon 500 MG capsule Take 500 mg by mouth every morning. (Patient not taking: Reported on 05/07/2021)     feeding supplement (ENSURE ENLIVE / ENSURE PLUS) LIQD Take 237 mLs by mouth 3 (three) times daily between meals. (Patient not taking: Reported on 05/07/2021) 237 mL 12   ondansetron (ZOFRAN) 4 MG tablet Take 1 tablet (4 mg total) by mouth every 6 (six) hours as needed for nausea. (Patient not taking: Reported on 05/07/2021) 20 tablet 0   polyethylene glycol (MIRALAX / GLYCOLAX) 17 g packet Take 17 g by mouth daily. (Patient not taking: Reported on 05/07/2021) 14 each 0   senna-docusate (SENOKOT-S) 8.6-50 MG tablet Take 1 tablet by mouth daily. (Patient not taking: Reported on 05/07/2021) 30 tablet 0   No current facility-administered medications for this encounter.   Wt Readings from Last 3 Encounters:  05/07/21 95.1 kg (209 lb 9.6 oz)  04/27/21 99.8 kg (220 lb 0.3 oz)  04/17/21 98 kg (216 lb 1.6 oz)   BP 112/86   Pulse 82   Wt 95.1 kg (209 lb 9.6 oz)   SpO2 92% Comment: on 2L  BMI 26.20 kg/m   ReDs 28% General:  NAD. No resp difficulty, frail, walked into clinic with cane on oxygen. HEENT: Normal Neck:  Supple. No JVD. Carotids 2+ bilat; no bruits. No lymphadenopathy or thryomegaly appreciated. Cor: PMI nondisplaced. Regular rate & rhythm. No rubs, gallops or murmurs. Lungs: Diminished throughout all lung fields. Abdomen: Soft, nontender, nondistended. No hepatosplenomegaly. No bruits or masses. Good bowel sounds. Extremities: No cyanosis, clubbing, rash, 1+ BLE edema Neuro: Alert & oriented x 3, cranial nerves grossly intact. Moves all 4 extremities w/o difficulty. Affect pleasant.  Assessment/Plan: 1. Chronic HFpEF/RV failure: Echo (2/22) with EF 55-60%, IV septum flattened, severe RV enlargement, severely decreased RV function, PASP 57 mmHg.  He has end-stage RV failure with recent admission for shock (septic + cardiogenic). NYHA IIIb, he is not volume overloaded today. - Continue torsemide 60 mg daily + 20 KCl.  - Continue Farxiga 10 mg daily. Discussed with Dr. Aundra Dubin with patient self-cathing. - Continue Midodrine 15 mg tid. - TED hose & elevate legs. 2. Pulmonary HTN: PAH noted on 4/22 RHC with PVR 6.1 WU.  This appears to be multifactorial with OSA, severe emphysema, and a suspected chronic PE involving the right middle lobe (group 3 and group 4 PH). Given the suspected mixed etiology with only 1 area of chronic thromboembolism (right middle lobe) as well as age, do not think that pulmonary thromboendarterectomy would be indicated.  Rheumatologic serologic workup was negative.  PFTs showed severe obstruction and moderate restriction, suggesting significant COPD. - Continue riociguat for CTEPH, 1.5 mg bid.  - c/w supp O2, on chronic O2 at home.   3. Ileus/partial SBO:  CT abdomen 04/19/21 with narrowing distal third duodenum with upstream dilatation. 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 Suspect ileus versus partial SBO, not certain this is due to the "nutcracker phenomenon" that has been there for a long time.  No  aspiration noted on swallow evaluation.  - Not surgical candidate.  - Tolerating a solid diet at home.   4. OSA: Untreated.  Insurance would apparently not cover home sleep study.  Will re-try. 5. Emphysema: Prior smoker.  Emphysema on CT and severe obstruction on PFTs - Continue home oxygen. No further increased oxygen requirements. 6. Chronic PE: Continue apixaban.  - CBC today. 7. BPH with urinary retention: Self caths 3-4x/day. Followed by Urology. - On finasteride.  8. Deconditioning/FTT: Body mass index is 26.2 kg/m. Weight continues to drop, although he says his appetite has improved. - Encouraged nutrient-dense meals + Ensure to get strength back. - Continue PT/OT. He is asking about a scooter. Advised awaiting PT recommendation.   Follow up with Dr. Aundra Dubin as scheduled next month.  [place] FNP 05/07/2021

## 2021-05-07 ENCOUNTER — Ambulatory Visit (HOSPITAL_COMMUNITY)
Admit: 2021-05-07 | Discharge: 2021-05-07 | Disposition: A | Discharge status: Home / Self Care | Payer: Medicare Other | Attending: Family Medicine | Admitting: Family Medicine

## 2021-05-07 ENCOUNTER — Encounter (HOSPITAL_COMMUNITY): Payer: Self-pay

## 2021-05-07 ENCOUNTER — Other Ambulatory Visit: Payer: Self-pay

## 2021-05-07 VITALS — BP 112/86 | HR 82 | Wt 209.6 lb

## 2021-05-07 DIAGNOSIS — Z9981 Dependence on supplemental oxygen: Secondary | ICD-10-CM | POA: Insufficient documentation

## 2021-05-07 DIAGNOSIS — K567 Ileus, unspecified: Secondary | ICD-10-CM | POA: Insufficient documentation

## 2021-05-07 DIAGNOSIS — Z7984 Long term (current) use of oral hypoglycemic drugs: Secondary | ICD-10-CM | POA: Diagnosis not present

## 2021-05-07 DIAGNOSIS — I2724 Chronic thromboembolic pulmonary hypertension: Secondary | ICD-10-CM | POA: Diagnosis not present

## 2021-05-07 DIAGNOSIS — K56600 Partial intestinal obstruction, unspecified as to cause: Secondary | ICD-10-CM | POA: Insufficient documentation

## 2021-05-07 DIAGNOSIS — G4733 Obstructive sleep apnea (adult) (pediatric): Secondary | ICD-10-CM | POA: Insufficient documentation

## 2021-05-07 DIAGNOSIS — Z86711 Personal history of pulmonary embolism: Secondary | ICD-10-CM

## 2021-05-07 DIAGNOSIS — Z8249 Family history of ischemic heart disease and other diseases of the circulatory system: Secondary | ICD-10-CM | POA: Diagnosis not present

## 2021-05-07 DIAGNOSIS — I2782 Chronic pulmonary embolism: Secondary | ICD-10-CM | POA: Diagnosis not present

## 2021-05-07 DIAGNOSIS — R627 Adult failure to thrive: Secondary | ICD-10-CM | POA: Diagnosis not present

## 2021-05-07 DIAGNOSIS — Z79899 Other long term (current) drug therapy: Secondary | ICD-10-CM | POA: Insufficient documentation

## 2021-05-07 DIAGNOSIS — I5032 Chronic diastolic (congestive) heart failure: Secondary | ICD-10-CM | POA: Insufficient documentation

## 2021-05-07 DIAGNOSIS — R5381 Other malaise: Secondary | ICD-10-CM

## 2021-05-07 DIAGNOSIS — I130 Hypertensive heart and chronic kidney disease with heart failure and stage 1 through stage 4 chronic kidney disease, or unspecified chronic kidney disease: Secondary | ICD-10-CM | POA: Diagnosis not present

## 2021-05-07 DIAGNOSIS — J449 Chronic obstructive pulmonary disease, unspecified: Secondary | ICD-10-CM

## 2021-05-07 DIAGNOSIS — N183 Chronic kidney disease, stage 3 unspecified: Secondary | ICD-10-CM | POA: Insufficient documentation

## 2021-05-07 DIAGNOSIS — Z7901 Long term (current) use of anticoagulants: Secondary | ICD-10-CM | POA: Diagnosis not present

## 2021-05-07 DIAGNOSIS — Z87891 Personal history of nicotine dependence: Secondary | ICD-10-CM | POA: Insufficient documentation

## 2021-05-07 DIAGNOSIS — J439 Emphysema, unspecified: Secondary | ICD-10-CM | POA: Insufficient documentation

## 2021-05-07 DIAGNOSIS — N401 Enlarged prostate with lower urinary tract symptoms: Secondary | ICD-10-CM | POA: Insufficient documentation

## 2021-05-07 DIAGNOSIS — I272 Pulmonary hypertension, unspecified: Secondary | ICD-10-CM

## 2021-05-07 DIAGNOSIS — Z6826 Body mass index (BMI) 26.0-26.9, adult: Secondary | ICD-10-CM | POA: Diagnosis not present

## 2021-05-07 DIAGNOSIS — R338 Other retention of urine: Secondary | ICD-10-CM | POA: Insufficient documentation

## 2021-05-07 DIAGNOSIS — Z7951 Long term (current) use of inhaled steroids: Secondary | ICD-10-CM | POA: Insufficient documentation

## 2021-05-07 DIAGNOSIS — I5084 End stage heart failure: Secondary | ICD-10-CM | POA: Insufficient documentation

## 2021-05-07 DIAGNOSIS — R066 Hiccough: Secondary | ICD-10-CM | POA: Insufficient documentation

## 2021-05-07 LAB — CBC
HCT: 45.9 % (ref 39.0–52.0)
Hemoglobin: 14.8 g/dL (ref 13.0–17.0)
MCH: 27.8 pg (ref 26.0–34.0)
MCHC: 32.2 g/dL (ref 30.0–36.0)
MCV: 86.3 fL (ref 80.0–100.0)
Platelets: 265 10*3/uL (ref 150–400)
RBC: 5.32 MIL/uL (ref 4.22–5.81)
RDW: 16 % — ABNORMAL HIGH (ref 11.5–15.5)
WBC: 4.7 10*3/uL (ref 4.0–10.5)
nRBC: 0 % (ref 0.0–0.2)

## 2021-05-07 LAB — COMPREHENSIVE METABOLIC PANEL
ALT: 29 U/L (ref 0–44)
AST: 33 U/L (ref 15–41)
Albumin: 3 g/dL — ABNORMAL LOW (ref 3.5–5.0)
Alkaline Phosphatase: 94 U/L (ref 38–126)
Anion gap: 12 (ref 5–15)
BUN: 24 mg/dL — ABNORMAL HIGH (ref 8–23)
CO2: 37 mmol/L — ABNORMAL HIGH (ref 22–32)
Calcium: 9.3 mg/dL (ref 8.9–10.3)
Chloride: 90 mmol/L — ABNORMAL LOW (ref 98–111)
Creatinine, Ser: 1.38 mg/dL — ABNORMAL HIGH (ref 0.61–1.24)
GFR, Estimated: 51 mL/min — ABNORMAL LOW (ref >60)
Glucose, Bld: 80 mg/dL (ref 70–99)
Potassium: 3.6 mmol/L (ref 3.5–5.1)
Sodium: 139 mmol/L (ref 135–145)
Total Bilirubin: 1.1 mg/dL (ref 0.3–1.2)
Total Protein: 7 g/dL (ref 6.5–8.1)

## 2021-05-07 NOTE — Patient Instructions (Signed)
It was great to see you today! No medication changes are needed at this time.   Labs today We will only contact you if something comes back abnormal or we need to make some changes. Otherwise no news is good news!  Keep followup as scheduled   Do the following things EVERYDAY: Weigh yourself in the morning before breakfast. Write it down and keep it in a log. Take your medicines as prescribed Eat low salt foods--Limit salt (sodium) to 2000 mg per day.  Stay as active as you can everyday Limit all fluids for the day to less than 2 liters  At the [facility], you and your health needs are our priority. As part of our continuing mission to provide you with exceptional heart care, we have created designated Provider Care Teams. These Care Teams include your primary Cardiologist (physician) and Advanced Practice Providers (APPs- Physician Assistants and Nurse Practitioners) who all work together to provide you with the care you need, when you need it.   You may see any of the following providers on your designated Care Team at your next follow up: Dr Glori Bickers Dr Loralie Champagne Dr Patrice Paradise, NP Lyda Jester, [place] Ginnie Smart Audry Riles, PharmD   Please be sure to bring in all your medications bottles to every appointment.   If you have any questions or concerns before your next appointment please send [place] a message through [place] or call our office at [phone].    TO LEAVE A MESSAGE FOR THE NURSE SELECT OPTION 2, PLEASE LEAVE A MESSAGE INCLUDING: YOUR NAME DATE OF BIRTH CALL BACK NUMBER REASON FOR CALL**this is important as we prioritize the call backs  YOU WILL RECEIVE A CALL BACK THE SAME DAY AS LONG AS YOU CALL BEFORE 4:00 PM

## 2021-05-07 NOTE — Progress Notes (Signed)
ReDS Vest / Clip - 05/07/21 1200       ReDS Vest / Clip   Station Marker C    Ruler Value 30    ReDS Actual Value 28

## 2021-05-07 NOTE — Telephone Encounter (Signed)
We do not order scooters from our office  Would defer to PCP since mobility issues have to be mentioned to qualify   I have called the pt and he is aware of RA recs.  He will reach out to his pcp for this.

## 2021-05-08 ENCOUNTER — Other Ambulatory Visit (HOSPITAL_COMMUNITY): Payer: Self-pay | Admitting: Cardiology

## 2021-05-08 ENCOUNTER — Inpatient Hospital Stay: Payer: Medicare Other | Admitting: Primary Care

## 2021-05-08 DIAGNOSIS — I5032 Chronic diastolic (congestive) heart failure: Secondary | ICD-10-CM | POA: Diagnosis not present

## 2021-05-08 DIAGNOSIS — I110 Hypertensive heart disease with heart failure: Secondary | ICD-10-CM | POA: Diagnosis not present

## 2021-05-08 DIAGNOSIS — I272 Pulmonary hypertension, unspecified: Secondary | ICD-10-CM | POA: Diagnosis not present

## 2021-05-08 DIAGNOSIS — J449 Chronic obstructive pulmonary disease, unspecified: Secondary | ICD-10-CM | POA: Diagnosis not present

## 2021-05-08 DIAGNOSIS — I2699 Other pulmonary embolism without acute cor pulmonale: Secondary | ICD-10-CM | POA: Diagnosis not present

## 2021-05-08 DIAGNOSIS — I50812 Chronic right heart failure: Secondary | ICD-10-CM | POA: Diagnosis not present

## 2021-05-09 ENCOUNTER — Other Ambulatory Visit: Payer: Self-pay | Admitting: *Deleted

## 2021-05-09 ENCOUNTER — Encounter: Payer: Self-pay | Admitting: *Deleted

## 2021-05-09 DIAGNOSIS — H938X3 Other specified disorders of ear, bilateral: Secondary | ICD-10-CM | POA: Diagnosis not present

## 2021-05-09 HISTORY — DX: Other specified disorders of ear, bilateral: H93.8X3

## 2021-05-09 NOTE — Patient Outreach (Signed)
Joshua [name]) Care Management  05/09/2021  Joshua [name] 09-07-1935 [id number]  [name] outreach to Joshua [name] patient  Joshua Joshua [name] was referred to Joshua [name] on 05/04/21 after a noted red alert flag for Joshua [name] outreaches on 05/03/21 for possible concerns with scheduling follow up appointments  Insurance Medicare   Outreach to [phone] Joshua [name] answered  She is listed on scanned Designated Party Release (DPR) to speak with about pt She is able to verify HIPAA identifiers for Joshua [name]   Joshua [name] follow up assessment Joshua [name] confirms Joshua [name] is doing better at home  No nausea or vomiting and eating 3 meals a day She reports the Joshua responses for 05/03/21 are not concerns She confirms Joshua [name] was seen by his cardiologist on Monday 05/07/21, seen by urology NP/PA on last week (Joshua [name]), is scheduled to see Joshua [name], primary care provider (PCP) on next week and has an upcoming appointment with pulmonology  Voiced concern today Joshua [name] stated Joshua [name] was losing about "a pound each day" and his MD encouraged protein intake Joshua [name] educated her on the importance of protein, discussed some high protein foods, protein drinks/powder (premier) and megace  Joshua education sent to confirmed e-mail address for Joshua [name] to an ACO, high calorie, high protein diet, minimize weigh loss info  Plans Joshua [name] agrees to plan of care for further outreach in 14-21 business days   [place] L. Lavina Hamman, Joshua, BSN, [place] Coordinator Office number [phone] Main [facility] number [phone] Fax number [phone]

## 2021-05-10 ENCOUNTER — Other Ambulatory Visit: Payer: Self-pay | Admitting: Physician Assistant

## 2021-05-14 DIAGNOSIS — I272 Pulmonary hypertension, unspecified: Secondary | ICD-10-CM | POA: Diagnosis not present

## 2021-05-14 DIAGNOSIS — I2699 Other pulmonary embolism without acute cor pulmonale: Secondary | ICD-10-CM | POA: Diagnosis not present

## 2021-05-14 DIAGNOSIS — J449 Chronic obstructive pulmonary disease, unspecified: Secondary | ICD-10-CM | POA: Diagnosis not present

## 2021-05-14 DIAGNOSIS — I50812 Chronic right heart failure: Secondary | ICD-10-CM | POA: Diagnosis not present

## 2021-05-14 DIAGNOSIS — I110 Hypertensive heart disease with heart failure: Secondary | ICD-10-CM | POA: Diagnosis not present

## 2021-05-14 DIAGNOSIS — I5032 Chronic diastolic (congestive) heart failure: Secondary | ICD-10-CM | POA: Diagnosis not present

## 2021-05-15 DIAGNOSIS — I5032 Chronic diastolic (congestive) heart failure: Secondary | ICD-10-CM | POA: Diagnosis not present

## 2021-05-15 DIAGNOSIS — I110 Hypertensive heart disease with heart failure: Secondary | ICD-10-CM | POA: Diagnosis not present

## 2021-05-15 DIAGNOSIS — I50812 Chronic right heart failure: Secondary | ICD-10-CM | POA: Diagnosis not present

## 2021-05-15 DIAGNOSIS — J449 Chronic obstructive pulmonary disease, unspecified: Secondary | ICD-10-CM | POA: Diagnosis not present

## 2021-05-15 DIAGNOSIS — I272 Pulmonary hypertension, unspecified: Secondary | ICD-10-CM | POA: Diagnosis not present

## 2021-05-15 DIAGNOSIS — I2699 Other pulmonary embolism without acute cor pulmonale: Secondary | ICD-10-CM | POA: Diagnosis not present

## 2021-05-16 DIAGNOSIS — Z23 Encounter for immunization: Secondary | ICD-10-CM | POA: Diagnosis not present

## 2021-05-16 DIAGNOSIS — J449 Chronic obstructive pulmonary disease, unspecified: Secondary | ICD-10-CM | POA: Diagnosis not present

## 2021-05-16 DIAGNOSIS — I5032 Chronic diastolic (congestive) heart failure: Secondary | ICD-10-CM | POA: Diagnosis not present

## 2021-05-16 DIAGNOSIS — L602 Onychogryphosis: Secondary | ICD-10-CM | POA: Diagnosis not present

## 2021-05-16 DIAGNOSIS — I959 Hypotension, unspecified: Secondary | ICD-10-CM | POA: Diagnosis not present

## 2021-05-16 DIAGNOSIS — I272 Pulmonary hypertension, unspecified: Secondary | ICD-10-CM | POA: Diagnosis not present

## 2021-05-16 DIAGNOSIS — R2681 Unsteadiness on feet: Secondary | ICD-10-CM | POA: Diagnosis not present

## 2021-05-16 DIAGNOSIS — M1990 Unspecified osteoarthritis, unspecified site: Secondary | ICD-10-CM | POA: Diagnosis not present

## 2021-05-16 DIAGNOSIS — Z09 Encounter for follow-up examination after completed treatment for conditions other than malignant neoplasm: Secondary | ICD-10-CM | POA: Diagnosis not present

## 2021-05-16 DIAGNOSIS — Z9981 Dependence on supplemental oxygen: Secondary | ICD-10-CM | POA: Diagnosis not present

## 2021-05-16 DIAGNOSIS — R066 Hiccough: Secondary | ICD-10-CM | POA: Diagnosis not present

## 2021-05-17 DIAGNOSIS — J449 Chronic obstructive pulmonary disease, unspecified: Secondary | ICD-10-CM | POA: Diagnosis not present

## 2021-05-17 DIAGNOSIS — I2699 Other pulmonary embolism without acute cor pulmonale: Secondary | ICD-10-CM | POA: Diagnosis not present

## 2021-05-17 DIAGNOSIS — I110 Hypertensive heart disease with heart failure: Secondary | ICD-10-CM | POA: Diagnosis not present

## 2021-05-17 DIAGNOSIS — I5032 Chronic diastolic (congestive) heart failure: Secondary | ICD-10-CM | POA: Diagnosis not present

## 2021-05-17 DIAGNOSIS — I272 Pulmonary hypertension, unspecified: Secondary | ICD-10-CM | POA: Diagnosis not present

## 2021-05-17 DIAGNOSIS — I50812 Chronic right heart failure: Secondary | ICD-10-CM | POA: Diagnosis not present

## 2021-05-21 ENCOUNTER — Other Ambulatory Visit: Payer: Self-pay | Admitting: *Deleted

## 2021-05-21 ENCOUNTER — Other Ambulatory Visit: Payer: Self-pay

## 2021-05-21 DIAGNOSIS — I110 Hypertensive heart disease with heart failure: Secondary | ICD-10-CM | POA: Diagnosis not present

## 2021-05-21 DIAGNOSIS — I272 Pulmonary hypertension, unspecified: Secondary | ICD-10-CM | POA: Diagnosis not present

## 2021-05-21 DIAGNOSIS — I5032 Chronic diastolic (congestive) heart failure: Secondary | ICD-10-CM | POA: Diagnosis not present

## 2021-05-21 DIAGNOSIS — I2699 Other pulmonary embolism without acute cor pulmonale: Secondary | ICD-10-CM | POA: Diagnosis not present

## 2021-05-21 DIAGNOSIS — J449 Chronic obstructive pulmonary disease, unspecified: Secondary | ICD-10-CM | POA: Diagnosis not present

## 2021-05-21 DIAGNOSIS — I50812 Chronic right heart failure: Secondary | ICD-10-CM | POA: Diagnosis not present

## 2021-05-21 NOTE — Patient Outreach (Addendum)
[facility]) Care Management  05/21/2021  Joshua [name] 12/17/1935 [id number]   [facility] outreach to EMMI general patient   Joshua [name] was referred to [facility] on 05/04/21 after a noted red alert flag for EMMI general outreaches on 05/03/21 for possible concerns with scheduling follow up appointments   Insurance Medicare  Outreach to the listed preferred number in EPIC [place] Joshua Kohlmeyer answered and greeted He reports he is with his physical therapist at the time of the outreach. RN CM offered apology but was able to assess from him that he was doing "okay" "good"   Weight management/nutrition Going well   He reports for the last 3 days he has been maintaining a weight value but did not offer the value  He denies other medical or care coordination needs He was encouraged to have Joshua Moroney outreach also if she has questions  Plans Patient agrees to care plan and follow up within the next 30-45 business days   Jatinder Mcdonagh L. Lavina Hamman, RN, BSN, [place] Coordinator Office number [phone] Main [facility] number [phone] Fax number [phone]

## 2021-05-22 ENCOUNTER — Other Ambulatory Visit (HOSPITAL_COMMUNITY): Payer: Self-pay | Admitting: *Deleted

## 2021-05-22 ENCOUNTER — Ambulatory Visit (HOSPITAL_COMMUNITY)
Admission: RE | Admit: 2021-05-22 | Discharge: 2021-05-22 | Disposition: A | Discharge status: Home / Self Care | Payer: Medicare Other | Source: Ambulatory Visit | Attending: Cardiology | Admitting: Cardiology

## 2021-05-22 DIAGNOSIS — Z87891 Personal history of nicotine dependence: Secondary | ICD-10-CM | POA: Diagnosis not present

## 2021-05-22 DIAGNOSIS — G4730 Sleep apnea, unspecified: Secondary | ICD-10-CM | POA: Diagnosis not present

## 2021-05-22 DIAGNOSIS — M1990 Unspecified osteoarthritis, unspecified site: Secondary | ICD-10-CM | POA: Diagnosis not present

## 2021-05-22 DIAGNOSIS — Z7901 Long term (current) use of anticoagulants: Secondary | ICD-10-CM | POA: Diagnosis not present

## 2021-05-22 DIAGNOSIS — I5032 Chronic diastolic (congestive) heart failure: Secondary | ICD-10-CM | POA: Insufficient documentation

## 2021-05-22 DIAGNOSIS — J449 Chronic obstructive pulmonary disease, unspecified: Secondary | ICD-10-CM | POA: Diagnosis not present

## 2021-05-22 DIAGNOSIS — I50812 Chronic right heart failure: Secondary | ICD-10-CM | POA: Diagnosis not present

## 2021-05-22 DIAGNOSIS — K219 Gastro-esophageal reflux disease without esophagitis: Secondary | ICD-10-CM | POA: Diagnosis not present

## 2021-05-22 DIAGNOSIS — Z9181 History of falling: Secondary | ICD-10-CM | POA: Diagnosis not present

## 2021-05-22 DIAGNOSIS — I130 Hypertensive heart and chronic kidney disease with heart failure and stage 1 through stage 4 chronic kidney disease, or unspecified chronic kidney disease: Secondary | ICD-10-CM | POA: Diagnosis not present

## 2021-05-22 DIAGNOSIS — N400 Enlarged prostate without lower urinary tract symptoms: Secondary | ICD-10-CM | POA: Diagnosis not present

## 2021-05-22 DIAGNOSIS — I2699 Other pulmonary embolism without acute cor pulmonale: Secondary | ICD-10-CM | POA: Diagnosis not present

## 2021-05-22 DIAGNOSIS — N1832 Chronic kidney disease, stage 3b: Secondary | ICD-10-CM | POA: Diagnosis not present

## 2021-05-22 DIAGNOSIS — I272 Pulmonary hypertension, unspecified: Secondary | ICD-10-CM | POA: Diagnosis not present

## 2021-05-22 LAB — BASIC METABOLIC PANEL
Anion gap: 9 (ref 5–15)
BUN: 20 mg/dL (ref 8–23)
CO2: 34 mmol/L — ABNORMAL HIGH (ref 22–32)
Calcium: 9.8 mg/dL (ref 8.9–10.3)
Chloride: 97 mmol/L — ABNORMAL LOW (ref 98–111)
Creatinine, Ser: 1.37 mg/dL — ABNORMAL HIGH (ref 0.61–1.24)
GFR, Estimated: 51 mL/min — ABNORMAL LOW (ref >60)
Glucose, Bld: 100 mg/dL — ABNORMAL HIGH (ref 70–99)
Potassium: 3.9 mmol/L (ref 3.5–5.1)
Sodium: 140 mmol/L (ref 135–145)

## 2021-05-22 MED ORDER — DAPAGLIFLOZIN PROPANEDIOL 10 MG PO TABS: 10.0000 mg | ORAL_TABLET | Freq: Every day | ORAL | 6 refills | Status: DC

## 2021-05-25 DIAGNOSIS — N1832 Chronic kidney disease, stage 3b: Secondary | ICD-10-CM | POA: Diagnosis not present

## 2021-05-25 DIAGNOSIS — I50812 Chronic right heart failure: Secondary | ICD-10-CM | POA: Diagnosis not present

## 2021-05-25 DIAGNOSIS — I5032 Chronic diastolic (congestive) heart failure: Secondary | ICD-10-CM | POA: Diagnosis not present

## 2021-05-25 DIAGNOSIS — I272 Pulmonary hypertension, unspecified: Secondary | ICD-10-CM | POA: Diagnosis not present

## 2021-05-25 DIAGNOSIS — J449 Chronic obstructive pulmonary disease, unspecified: Secondary | ICD-10-CM | POA: Diagnosis not present

## 2021-05-25 DIAGNOSIS — I130 Hypertensive heart and chronic kidney disease with heart failure and stage 1 through stage 4 chronic kidney disease, or unspecified chronic kidney disease: Secondary | ICD-10-CM | POA: Diagnosis not present

## 2021-05-29 DIAGNOSIS — I5032 Chronic diastolic (congestive) heart failure: Secondary | ICD-10-CM | POA: Diagnosis not present

## 2021-05-29 DIAGNOSIS — N1832 Chronic kidney disease, stage 3b: Secondary | ICD-10-CM | POA: Diagnosis not present

## 2021-05-29 DIAGNOSIS — I50812 Chronic right heart failure: Secondary | ICD-10-CM | POA: Diagnosis not present

## 2021-05-29 DIAGNOSIS — I272 Pulmonary hypertension, unspecified: Secondary | ICD-10-CM | POA: Diagnosis not present

## 2021-05-29 DIAGNOSIS — J449 Chronic obstructive pulmonary disease, unspecified: Secondary | ICD-10-CM | POA: Diagnosis not present

## 2021-05-29 DIAGNOSIS — I130 Hypertensive heart and chronic kidney disease with heart failure and stage 1 through stage 4 chronic kidney disease, or unspecified chronic kidney disease: Secondary | ICD-10-CM | POA: Diagnosis not present

## 2021-05-30 ENCOUNTER — Other Ambulatory Visit (HOSPITAL_COMMUNITY): Payer: Self-pay | Admitting: *Deleted

## 2021-05-30 ENCOUNTER — Telehealth: Payer: Self-pay | Admitting: Pulmonary Disease

## 2021-05-30 MED ORDER — TIOTROPIUM BROMIDE-OLODATEROL 2.5-2.5 MCG/ACT IN AERS: 2.0000 | INHALATION_SPRAY | Freq: Every day | RESPIRATORY_TRACT | 5 refills | Status: DC

## 2021-05-30 MED ORDER — MIDODRINE HCL 5 MG PO TABS: 15.0000 mg | ORAL_TABLET | Freq: Three times a day (TID) | ORAL | 3 refills | Status: DC

## 2021-05-30 NOTE — Telephone Encounter (Signed)
Call made to patient, confirmed DOB. Made aware of RA recommendations. Refill sent to pharmacy.   Nothing further needed at this time.

## 2021-05-30 NOTE — Telephone Encounter (Signed)
Call returned to patient, confirmed DOB. Patient states he was recently in the hospital and he was given Anoro. He wants to know if he should continue the Anoro or go back on his stiolto. He states he does not see any difference in either of the two stating they feel about the same.   RA please advise. Thanks

## 2021-05-31 DIAGNOSIS — I50812 Chronic right heart failure: Secondary | ICD-10-CM | POA: Diagnosis not present

## 2021-05-31 DIAGNOSIS — I130 Hypertensive heart and chronic kidney disease with heart failure and stage 1 through stage 4 chronic kidney disease, or unspecified chronic kidney disease: Secondary | ICD-10-CM | POA: Diagnosis not present

## 2021-05-31 DIAGNOSIS — I5032 Chronic diastolic (congestive) heart failure: Secondary | ICD-10-CM | POA: Diagnosis not present

## 2021-05-31 DIAGNOSIS — I272 Pulmonary hypertension, unspecified: Secondary | ICD-10-CM | POA: Diagnosis not present

## 2021-05-31 DIAGNOSIS — N1832 Chronic kidney disease, stage 3b: Secondary | ICD-10-CM | POA: Diagnosis not present

## 2021-05-31 DIAGNOSIS — J449 Chronic obstructive pulmonary disease, unspecified: Secondary | ICD-10-CM | POA: Diagnosis not present

## 2021-06-04 DIAGNOSIS — I50812 Chronic right heart failure: Secondary | ICD-10-CM | POA: Diagnosis not present

## 2021-06-04 DIAGNOSIS — I5032 Chronic diastolic (congestive) heart failure: Secondary | ICD-10-CM | POA: Diagnosis not present

## 2021-06-04 DIAGNOSIS — N1832 Chronic kidney disease, stage 3b: Secondary | ICD-10-CM | POA: Diagnosis not present

## 2021-06-04 DIAGNOSIS — J449 Chronic obstructive pulmonary disease, unspecified: Secondary | ICD-10-CM | POA: Diagnosis not present

## 2021-06-04 DIAGNOSIS — I272 Pulmonary hypertension, unspecified: Secondary | ICD-10-CM | POA: Diagnosis not present

## 2021-06-04 DIAGNOSIS — I130 Hypertensive heart and chronic kidney disease with heart failure and stage 1 through stage 4 chronic kidney disease, or unspecified chronic kidney disease: Secondary | ICD-10-CM | POA: Diagnosis not present

## 2021-06-06 DIAGNOSIS — N1832 Chronic kidney disease, stage 3b: Secondary | ICD-10-CM | POA: Diagnosis not present

## 2021-06-06 DIAGNOSIS — I130 Hypertensive heart and chronic kidney disease with heart failure and stage 1 through stage 4 chronic kidney disease, or unspecified chronic kidney disease: Secondary | ICD-10-CM | POA: Diagnosis not present

## 2021-06-06 DIAGNOSIS — I50812 Chronic right heart failure: Secondary | ICD-10-CM | POA: Diagnosis not present

## 2021-06-06 DIAGNOSIS — J449 Chronic obstructive pulmonary disease, unspecified: Secondary | ICD-10-CM | POA: Diagnosis not present

## 2021-06-06 DIAGNOSIS — I272 Pulmonary hypertension, unspecified: Secondary | ICD-10-CM | POA: Diagnosis not present

## 2021-06-06 DIAGNOSIS — I5032 Chronic diastolic (congestive) heart failure: Secondary | ICD-10-CM | POA: Diagnosis not present

## 2021-06-12 ENCOUNTER — Ambulatory Visit (INDEPENDENT_AMBULATORY_CARE_PROVIDER_SITE_OTHER): Payer: Medicare Other | Admitting: Pulmonary Disease

## 2021-06-12 ENCOUNTER — Encounter: Payer: Self-pay | Admitting: Pulmonary Disease

## 2021-06-12 ENCOUNTER — Other Ambulatory Visit: Payer: Self-pay

## 2021-06-12 DIAGNOSIS — J432 Centrilobular emphysema: Secondary | ICD-10-CM

## 2021-06-12 DIAGNOSIS — I248 Other forms of acute ischemic heart disease: Secondary | ICD-10-CM

## 2021-06-12 DIAGNOSIS — J9611 Chronic respiratory failure with hypoxia: Secondary | ICD-10-CM | POA: Diagnosis not present

## 2021-06-12 DIAGNOSIS — I272 Pulmonary hypertension, unspecified: Secondary | ICD-10-CM

## 2021-06-12 NOTE — Assessment & Plan Note (Signed)
He will continue on oxygen during exertion and sleep

## 2021-06-12 NOTE — Assessment & Plan Note (Signed)
Being treated as CTEPH. Continue apixaban and riociguat

## 2021-06-12 NOTE — Progress Notes (Signed)
   Subjective:    Patient ID: Joshua [name], male    DOB: 12-11-1935, 85 y.o.   MRN: [id number]  HPI  85 yo former smoker for FU of COPD, chronic PE & pulmonary hypertension PMh/o PE postop after a knee replacement in 2016. He was hospitalized 08/2017 for 3 days for acute hypoxic respiratory failure, attributed to diastolic heart failure.  He was diuresed 2.5 L, had elevated troponin, follow-up stress Myoview was low risk for ischemia.   Meds-  tried on Breo, anoro & trelegy  none of these gave any symptomatic relief -prefers Spiriva. Started on rociguat 01/2021 Last office visit 02/2021, we requalify him for oxygen, we retried him on Stiolto which he seemed to prefer   He was hospitalized 03/2021 for CHF exacerbation and started on Farxiga and midodrine He was again hospitalized 9/1 to 04/30/2021 with persistent nausea and vomiting and hypotension, requiring pressors.  He was seen by palliative care and limited CODE STATUS was issued, restarted on diuretics eventually.  CT abdomen/pelvis showed narrowing of duodenum but upper GI series did not show any stricture, there was an abdominal hernia with nonobstructed bowel I have reviewed both discharge summaries 27-monthfollow-up visit today, he arrives accompanied by his wife, ambulates with a cane, on oxygen. Complains of hiccups. Breathing remains poor, leg edema is stable, he takes torsemide 40 mg daily Denies dizziness or falls, compliant with oxygen Still complains of intermittent nausea  Significant tests/ events reviewed  02/2021 Ambulatory saturation was 93% at rest and desaturated to 87% on walking, recovered with 3 L of oxygen and maintain while ambulating   RHC (4/22) with mean RA 5, PA 65/19 mean 36, mean PCWP 5, CI 2.19, PVR 6.1 WU, PAPi 9.2.   Echo 12/2017 RVSP 39  NPSG 02/2018 >> no OSA, 45 mins desatn, TST 5.5 h   09/15/14 CT chest showed a large bilateral pulmonary embolism with RV dilatation. 08/2014 Venous Doppler showed no  DVT    01/29/21- FEV1 51%, no BD response, ratio 61, TLC 77%, DLCO 35%  PFTs  11/2014 - FEV1 59%, no BD response, ratio 70, TLC 68%, DLCO 35%     CT angiogram 08/2017 left upper lobe nodule 9x6 mm. CT 03/2018 which showed resolution of nodules  Review of Systems neg for any significant sore throat, dysphagia, itching, sneezing, nasal congestion or excess/ purulent secretions, fever, chills, sweats, unintended wt loss, pleuritic or exertional cp, hempoptysis, orthopnea pnd or change in chronic leg swelling. Also denies presyncope, palpitations, heartburn, abdominal pain, nausea, vomiting, diarrhea or change in bowel or urinary habits, dysuria,hematuria, rash, arthralgias, visual complaints, headache, numbness weakness or ataxia.     Objective:   Physical Exam  Gen. Pleasant, obese, in no distress ENT - no lesions, no post nasal drip Neck: No JVD, no thyromegaly, no carotid bruits Lungs: no use of accessory muscles, no dullness to percussion, decreased without rales or rhonchi  Cardiovascular: Rhythm regular, heart sounds  normal, no murmurs or gallops, 1+ peripheral edema Musculoskeletal: No deformities, no cyanosis or clubbing , no tremors        Assessment & Plan:

## 2021-06-12 NOTE — Assessment & Plan Note (Signed)
Continue on Stiolto, better drug delivery with this device Use albuterol on an as-needed basis

## 2021-06-12 NOTE — Patient Instructions (Addendum)
Continue on stiolto Oxygen on walking & sleep

## 2021-06-14 ENCOUNTER — Telehealth (HOSPITAL_COMMUNITY): Payer: Self-pay

## 2021-06-14 ENCOUNTER — Ambulatory Visit (HOSPITAL_COMMUNITY)
Admission: RE | Admit: 2021-06-14 | Discharge: 2021-06-14 | Disposition: A | Discharge status: Home / Self Care | Payer: Medicare Other | Source: Ambulatory Visit | Attending: Cardiology | Admitting: Cardiology

## 2021-06-14 ENCOUNTER — Encounter (HOSPITAL_COMMUNITY): Payer: Self-pay | Admitting: Cardiology

## 2021-06-14 ENCOUNTER — Other Ambulatory Visit: Payer: Self-pay

## 2021-06-14 VITALS — BP 102/60 | HR 103 | Wt 212.8 lb

## 2021-06-14 DIAGNOSIS — R1110 Vomiting, unspecified: Secondary | ICD-10-CM | POA: Insufficient documentation

## 2021-06-14 DIAGNOSIS — Z9981 Dependence on supplemental oxygen: Secondary | ICD-10-CM | POA: Diagnosis not present

## 2021-06-14 DIAGNOSIS — I5032 Chronic diastolic (congestive) heart failure: Secondary | ICD-10-CM | POA: Diagnosis not present

## 2021-06-14 DIAGNOSIS — Z8249 Family history of ischemic heart disease and other diseases of the circulatory system: Secondary | ICD-10-CM | POA: Insufficient documentation

## 2021-06-14 DIAGNOSIS — I130 Hypertensive heart and chronic kidney disease with heart failure and stage 1 through stage 4 chronic kidney disease, or unspecified chronic kidney disease: Secondary | ICD-10-CM | POA: Diagnosis not present

## 2021-06-14 DIAGNOSIS — G4733 Obstructive sleep apnea (adult) (pediatric): Secondary | ICD-10-CM | POA: Diagnosis not present

## 2021-06-14 DIAGNOSIS — Z87891 Personal history of nicotine dependence: Secondary | ICD-10-CM | POA: Insufficient documentation

## 2021-06-14 DIAGNOSIS — J439 Emphysema, unspecified: Secondary | ICD-10-CM | POA: Diagnosis not present

## 2021-06-14 DIAGNOSIS — I2724 Chronic thromboembolic pulmonary hypertension: Secondary | ICD-10-CM | POA: Insufficient documentation

## 2021-06-14 DIAGNOSIS — Z7901 Long term (current) use of anticoagulants: Secondary | ICD-10-CM | POA: Insufficient documentation

## 2021-06-14 DIAGNOSIS — I2782 Chronic pulmonary embolism: Secondary | ICD-10-CM | POA: Insufficient documentation

## 2021-06-14 DIAGNOSIS — N183 Chronic kidney disease, stage 3 unspecified: Secondary | ICD-10-CM | POA: Insufficient documentation

## 2021-06-14 DIAGNOSIS — Z7984 Long term (current) use of oral hypoglycemic drugs: Secondary | ICD-10-CM | POA: Diagnosis not present

## 2021-06-14 DIAGNOSIS — Z79899 Other long term (current) drug therapy: Secondary | ICD-10-CM | POA: Insufficient documentation

## 2021-06-14 LAB — BASIC METABOLIC PANEL
Anion gap: 6 (ref 5–15)
BUN: 18 mg/dL (ref 8–23)
CO2: 36 mmol/L — ABNORMAL HIGH (ref 22–32)
Calcium: 9.4 mg/dL (ref 8.9–10.3)
Chloride: 96 mmol/L — ABNORMAL LOW (ref 98–111)
Creatinine, Ser: 1.34 mg/dL — ABNORMAL HIGH (ref 0.61–1.24)
GFR, Estimated: 53 mL/min — ABNORMAL LOW (ref >60)
Glucose, Bld: 93 mg/dL (ref 70–99)
Potassium: 3.5 mmol/L (ref 3.5–5.1)
Sodium: 138 mmol/L (ref 135–145)

## 2021-06-14 LAB — BRAIN NATRIURETIC PEPTIDE: B Natriuretic Peptide: 389.4 pg/mL — ABNORMAL HIGH (ref 0.0–100.0)

## 2021-06-14 MED ORDER — MIDODRINE HCL 5 MG PO TABS: 15.0000 mg | ORAL_TABLET | Freq: Three times a day (TID) | ORAL | 3 refills | Status: DC

## 2021-06-14 NOTE — Telephone Encounter (Signed)
Patient did a 6 minute walk today and he did 274.32 meters. Patient's oxygen started at 91% pulse was1O2. 2 minutes his O2 was 86% and his pule was 105. 4 minutes 84% pulse was 107 6 minutes 73% pulse was 108.

## 2021-06-14 NOTE — Progress Notes (Signed)
ReDS Vest / Clip - 06/14/21 1000       ReDS Vest / Clip   Station Marker C    Ruler Value 28    ReDS Value Range Low volume    ReDS Actual Value 28

## 2021-06-14 NOTE — Progress Notes (Signed)
PCP: [name], DO Cardiology: Dr. Burt Knack HF Cardiology: Dr. Aundra Dubin  85 y.o. with history of HFPEF, prior PE, and pulmonary hypertension was referred by Dr. Gasper Sells for evaluation of pulmonary hypertension.  Patient had a PE in 2016.  He is on apixaban. He has a long history of diastolic CHF.  Most recent echo showed a significant component of RV failure with EF 55-60%, IV septum flattened, severe RV enlargement, severely decreased RV function, PASP 57 mmHg.  CTA chest in 4/22 showed no acute PE and mild emphysema, but V/Q scan in 5/22 was suggestive of chronic PE in the right middle lobe.  RHC in 4/22 showed normal filling pressures with moderate PAH. He additionally has chronic hiccups followed by Dr. Benson Norway, now on gabapentin. PFTs in 6/22 showed mixed picture with severe obstruction, moderate restriction, and severely decreased DLCO.   Admitted 8/25-8/30/22 with A/C HF exacerbation. He was aggressively diuresed with lasix/metolazone, had unna boots, farxiga added.  Midodrine was increased for hypotension. Hospitalization c/b AKI on CKD III.  Readmitted 9/1-9/12/22 with shock (mixed septic and cardiogenic), likely 2/2 to aspiration pneumonia and A/C CHF.  Suspect possible gut translocation with ileus/partial SBO; CT abdomen with "nutcracker phenomenon".  He was started on NE + antibiotics. General surgery consulted and felt not to be a surgical candidate. Diuretics initially held due to over-diuresis and shock. Eventually able to wean pressors off, restart PO torsemide and midodrine. Hospitalization c/b transaminitis, delirium, and BPH with urinary retention, requiring foley catheter. Palliative care was consulted for [place] and patient DNR/DNI. PT/OT recommended HH. He was discharged home, weight 220 lbs.  Today he returns for HF followup.  He continues to use home oxygen 2L by nasal cannula.  He is not short of breath walking on flat ground as long as he has his oxygen.  No chest pain.  He can  climb a flight of stairs.  Fatigues easily in general.  He continues to have trouble with regurgitating his food. Followed by Dr. Benson Norway for GI. No abdominal pain.  No orthopnea/PND.  No lightheadedness or falls. Weight is up 3 lbs.   ECG (personally reviewed): NSR with RBBB  Labs (4/22): K 4.8, creatinine 1.67 Labs (6/22): K 3.8, creatinine 1.72, ANA negative, RF 39.6 but CCP negative, SCL-70 negative, BNP 1231, HIV negative Labs (7/22): K 3.8, creatine 1.67 Labs (8/22): K 3.6, creatinine 1.6, hgb 15.3  Labs (9/22): K 5.0, creatinine 1.2, hgb 14.6 Labs (10/22): K 3.4, creatinine 1.37  REDS clip 28%  6 minute walk (7/22): 213 m 6 minute walk (10/22): 274 m (oxygen saturation dropped to 70s on 2L )  PMH: 1. HFPEF: With prominent RV failure.   - Echo (2/22): EF 55-60%, IV septum flattened, severe RV enlargement, severely decreased RV function, PASP 57 mmHg.  2. Venous thromboembolic disease: PE in 4103.   - Venous dopplers (4/22): No DVT.  - CTA chest (4/22): No PE.  - V/Q scan 5/22 with perfusion defect in the RML consistent with chronic PE.  3. OSA: Does not tolerate CPAP.  4. HTN 5. COPD: Prior smoker.   - CTA chest in 4/22 showed no PE, mild emphysema.  - PFTs (6/22) with severe obstruction, moderate restriction, severely decreased DLCO 6. Pulmonary hypertension: RHC (4/22) with mean RA 5, PA 65/19 mean 36, mean PCWP 5, CI 2.19, PVR 6.1 WU, PAPi 9.2.  7. Chronic hiccups 8. BPH: Has to in and out cath at times. 9. Syncope: Zio 2 week mostly SR, BBB/IVCD present, 2  episodes of VT occurred, fastest 10 beats w/ max rate of 160 bpm, 17 SVT runs with fastest interval 9.7 seconds with max rate of 218  10. Chronic hiccups 11. 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  Social History   Socioeconomic History   Marital status: Married    Spouse name: Joann   Number of children: Not on file   Years of education:  Not on file   Highest education level: Not on file  Occupational History   Not on file  Tobacco Use   Smoking status: Former    Packs/day: 1.00    Years: 50.00    Pack years: 50.00    Types: Cigarettes    Start date: 23    Quit date: 08/19/2006    Years since quitting: 14.8   Smokeless tobacco: Never  Vaping Use   Vaping Use: Never used  Substance and Sexual Activity   Alcohol use: Not Currently    Alcohol/week: 6.0 standard drinks    Types: 6 Shots of liquor per week   Drug use: No   Sexual activity: Not on file  Other Topics Concern   Not on file  Social History Narrative   Not on file   Social Determinants of Health   Financial Resource Strain: Not on file  Food Insecurity: No Food Insecurity   Worried About Running Out of Food in the Last Year: Never true   [place] in the Last Year: Never true  Transportation Needs: No Transportation Needs   Lack of Transportation (Medical): No   Lack of Transportation (Non-Medical): No  Physical Activity: Not on file  Stress: No Stress Concern Present   Feeling of Stress : Only a little  Social Connections: Not on file  Intimate Partner Violence: Not At Risk   Fear of Current or Ex-Partner: No   Emotionally Abused: No   Physically Abused: No   Sexually Abused: No   Family History  Problem Relation Age of Onset   Heart attack Brother 42   ROS: All systems reviewed and negative except as per HPI.   Current Outpatient Medications  Medication Sig Dispense Refill   albuterol (VENTOLIN HFA) 108 (90 Base) MCG/ACT inhaler Inhale 1 puff into the lungs every 4 (four) hours as needed for shortness of breath.     allopurinol (ZYLOPRIM) 100 MG tablet Take 100 mg by mouth every morning.     apixaban (ELIQUIS) 5 MG TABS tablet Take 1 tablet (5 mg total) by mouth 2 (two) times daily. 60 tablet    Azelastine HCl 0.15 % SOLN Place 2 sprays into the nose daily as needed (allergies).     Cinnamon 500 MG capsule Take 500 mg by mouth  every morning.     dapagliflozin propanediol (FARXIGA) 10 MG TABS tablet Take 1 tablet (10 mg total) by mouth daily. 30 tablet 6   feeding supplement (ENSURE ENLIVE / ENSURE PLUS) LIQD Take 237 mLs by mouth 3 (three) times daily between meals. (Patient taking differently: Take 237 mLs by mouth. Patient takes daily with lunch.) 237 mL 12   finasteride (PROSCAR) 5 MG tablet Take 5 mg by mouth at bedtime.     gabapentin (NEURONTIN) 100 MG capsule Take 200 mg by mouth 3 (three) times daily.     ipratropium (ATROVENT) 0.03 % nasal spray Place 1 spray into both nostrils daily as needed (allergies).     magnesium oxide (MAG-OX) 400 (240  Mg) MG tablet TAKE ONE CAPSULE BY MOUTH TWICE DAILY FOR 2 WEEKS, THEN DECREASE TO ONCE DAILY THEREAFTER. 90 tablet 1   Multiple Vitamin (MULTIVITAMIN WITH MINERALS) TABS tablet Take 1 tablet by mouth daily. 30 tablet 0   ondansetron (ZOFRAN) 4 MG tablet Take 1 tablet (4 mg total) by mouth every 6 (six) hours as needed for nausea. 20 tablet 0   OXYGEN Inhale 2 L into the lungs as needed for shortness of breath.     pantoprazole (PROTONIX) 40 MG tablet Take 40 mg by mouth every morning.     potassium chloride 20 MEQ TBCR Take 20 mEq by mouth daily. 30 tablet 0   Riociguat (ADEMPAS) 1.5 MG TABS Take 1.5 mg by mouth in the morning, at noon, and at bedtime. 90 tablet 0   Tiotropium Bromide-Olodaterol 2.5-2.5 MCG/ACT AERS Inhale 2 puffs into the lungs daily. 4 g 5   torsemide 60 MG TABS Take 60 mg by mouth daily. 30 tablet 0   midodrine (PROAMATINE) 5 MG tablet Take 3 tablets (15 mg total) by mouth 3 (three) times daily with meals. 270 tablet 3   No current facility-administered medications for this encounter.   Wt Readings from Last 3 Encounters:  06/14/21 96.5 kg (212 lb 12.8 oz)  06/12/21 95.7 kg (211 lb)  05/07/21 95.1 kg (209 lb 9.6 oz)   BP 102/60   Pulse (!) 103   Wt 96.5 kg (212 lb 12.8 oz)   SpO2 94%   BMI 26.60 kg/m  General: NAD Neck: JVP 7-8 cm, no  thyromegaly or thyroid nodule.  Lungs: Clear to auscultation bilaterally with normal respiratory effort. CV: Nondisplaced PMI.  Heart regular S1/S2, no S3/S4, no murmur.  No peripheral edema.  No carotid bruit.  Normal pedal pulses.  Abdomen: Soft, nontender, no hepatosplenomegaly, mild distention.  Skin: Intact without lesions or rashes.  Neurologic: Alert and oriented x 3.  Psych: Normal affect. Extremities: No clubbing or cyanosis.  HEENT: Normal.   Assessment/Plan: 1. Chronic HFpEF/RV failure: Echo (2/22) with EF 55-60%, IV septum flattened, severe RV enlargement, severely decreased RV function, PASP 57 mmHg.  He has severe RV failure.  On exam today and by REDS clip, he is not significantly volume overloaded.  NYHA class II.  BP stable on midodrine with no orthostatic symptoms.  - Continue torsemide 60 mg daily. BMET/BNP today.  - Continue Midodrine 15 mg TID 2. Pulmonary HTN: PAH noted on 4/22 RHC with PVR 6.1 WU.  This appears to be multifactorial with OSA, severe emphysema, and a suspected chronic PE involving the right middle lobe (group 3 and group 4 PH). Given the suspected mixed etiology with only 1 area of chronic thromboembolism (right middle lobe) as well as age, do not think that pulmonary thromboendarterectomy would be indicated.  Rheumatologic serologic workup was negative.  PFTs showed severe obstruction and moderate restriction, suggesting significant COPD.  6 minute walk mildly improved.  - Continue riociguat for CTEPH, 1.5 mg bid.  - Continue 2L home oxygen at rest, he needs to increase to 4L with exertion.  3. Stage III CKD: BMET today.  4. OSA: Untreated.  Has home sleep study, needs to do.  5. Emphysema: Prior smoker.  Emphysema on CT and severe obstruction on PFTs - He uses home oxygen as above.   6. Chronic PE: Continue apixaban.  7. Chronic regurgitation: Post-meals.  Needs to followup with Dr. Benson Norway with GI.   Followup in 6 wks with APP  Kirk Ruths  Aundra Dubin 06/14/2021

## 2021-06-14 NOTE — Patient Instructions (Signed)
Labs done today, your results will be available in MyChart, we will contact you for abnormal readings.  Please increase your Oxygen to 4 L with ambulation (exertion)  Please complete your Sleep Study  Your physician recommends that you follow-up with Dr Benson Norway (GI), please call his office for an appointment  Your physician recommends that you schedule a follow-up appointment in: 6 weeks  If you have any questions or concerns before your next appointment please send [place] a message through [facility] or call our office at [phone].    TO LEAVE A MESSAGE FOR THE NURSE SELECT OPTION 2, PLEASE LEAVE A MESSAGE INCLUDING: YOUR NAME DATE OF BIRTH CALL BACK NUMBER REASON FOR CALL**this is important as we prioritize the call backs  YOU WILL RECEIVE A CALL BACK THE SAME DAY AS LONG AS YOU CALL BEFORE 4:00 PM  At the [facility], you and your health needs are our priority. As part of our continuing mission to provide you with exceptional heart care, we have created designated Provider Care Teams. These Care Teams include your primary Cardiologist (physician) and Advanced Practice Providers (APPs- Physician Assistants and Nurse Practitioners) who all work together to provide you with the care you need, when you need it.   You may see any of the following providers on your designated Care Team at your next follow up: Dr Glori Bickers Dr Haynes Kerns, NP Lyda Jester, [place] [facility] [place], [place] Audry Riles, PharmD   Please be sure to bring in all your medications bottles to every appointment.

## 2021-06-15 DIAGNOSIS — I50812 Chronic right heart failure: Secondary | ICD-10-CM | POA: Diagnosis not present

## 2021-06-15 DIAGNOSIS — J449 Chronic obstructive pulmonary disease, unspecified: Secondary | ICD-10-CM | POA: Diagnosis not present

## 2021-06-15 DIAGNOSIS — I272 Pulmonary hypertension, unspecified: Secondary | ICD-10-CM | POA: Diagnosis not present

## 2021-06-15 DIAGNOSIS — I130 Hypertensive heart and chronic kidney disease with heart failure and stage 1 through stage 4 chronic kidney disease, or unspecified chronic kidney disease: Secondary | ICD-10-CM | POA: Diagnosis not present

## 2021-06-15 DIAGNOSIS — I5032 Chronic diastolic (congestive) heart failure: Secondary | ICD-10-CM | POA: Diagnosis not present

## 2021-06-15 DIAGNOSIS — N1832 Chronic kidney disease, stage 3b: Secondary | ICD-10-CM | POA: Diagnosis not present

## 2021-06-16 ENCOUNTER — Other Ambulatory Visit (HOSPITAL_COMMUNITY): Payer: Self-pay | Admitting: Family Medicine

## 2021-06-16 DIAGNOSIS — I272 Pulmonary hypertension, unspecified: Secondary | ICD-10-CM | POA: Diagnosis not present

## 2021-06-16 DIAGNOSIS — J449 Chronic obstructive pulmonary disease, unspecified: Secondary | ICD-10-CM | POA: Diagnosis not present

## 2021-06-16 DIAGNOSIS — I130 Hypertensive heart and chronic kidney disease with heart failure and stage 1 through stage 4 chronic kidney disease, or unspecified chronic kidney disease: Secondary | ICD-10-CM | POA: Diagnosis not present

## 2021-06-16 DIAGNOSIS — N1832 Chronic kidney disease, stage 3b: Secondary | ICD-10-CM | POA: Diagnosis not present

## 2021-06-16 DIAGNOSIS — I5032 Chronic diastolic (congestive) heart failure: Secondary | ICD-10-CM | POA: Diagnosis not present

## 2021-06-16 DIAGNOSIS — I50812 Chronic right heart failure: Secondary | ICD-10-CM | POA: Diagnosis not present

## 2021-06-18 DIAGNOSIS — E119 Type 2 diabetes mellitus without complications: Secondary | ICD-10-CM | POA: Diagnosis not present

## 2021-06-18 DIAGNOSIS — J449 Chronic obstructive pulmonary disease, unspecified: Secondary | ICD-10-CM | POA: Diagnosis not present

## 2021-06-18 DIAGNOSIS — I5032 Chronic diastolic (congestive) heart failure: Secondary | ICD-10-CM | POA: Diagnosis not present

## 2021-06-18 DIAGNOSIS — R634 Abnormal weight loss: Secondary | ICD-10-CM | POA: Diagnosis not present

## 2021-06-18 DIAGNOSIS — R933 Abnormal findings on diagnostic imaging of other parts of digestive tract: Secondary | ICD-10-CM | POA: Diagnosis not present

## 2021-06-18 DIAGNOSIS — K219 Gastro-esophageal reflux disease without esophagitis: Secondary | ICD-10-CM | POA: Diagnosis not present

## 2021-06-18 DIAGNOSIS — R112 Nausea with vomiting, unspecified: Secondary | ICD-10-CM | POA: Diagnosis not present

## 2021-06-18 DIAGNOSIS — R066 Hiccough: Secondary | ICD-10-CM | POA: Diagnosis not present

## 2021-06-21 DIAGNOSIS — Z9181 History of falling: Secondary | ICD-10-CM | POA: Diagnosis not present

## 2021-06-21 DIAGNOSIS — M1990 Unspecified osteoarthritis, unspecified site: Secondary | ICD-10-CM | POA: Diagnosis not present

## 2021-06-21 DIAGNOSIS — K219 Gastro-esophageal reflux disease without esophagitis: Secondary | ICD-10-CM | POA: Diagnosis not present

## 2021-06-21 DIAGNOSIS — I272 Pulmonary hypertension, unspecified: Secondary | ICD-10-CM | POA: Diagnosis not present

## 2021-06-21 DIAGNOSIS — Z7901 Long term (current) use of anticoagulants: Secondary | ICD-10-CM | POA: Diagnosis not present

## 2021-06-21 DIAGNOSIS — I2699 Other pulmonary embolism without acute cor pulmonale: Secondary | ICD-10-CM | POA: Diagnosis not present

## 2021-06-21 DIAGNOSIS — I130 Hypertensive heart and chronic kidney disease with heart failure and stage 1 through stage 4 chronic kidney disease, or unspecified chronic kidney disease: Secondary | ICD-10-CM | POA: Diagnosis not present

## 2021-06-21 DIAGNOSIS — J449 Chronic obstructive pulmonary disease, unspecified: Secondary | ICD-10-CM | POA: Diagnosis not present

## 2021-06-21 DIAGNOSIS — N400 Enlarged prostate without lower urinary tract symptoms: Secondary | ICD-10-CM | POA: Diagnosis not present

## 2021-06-21 DIAGNOSIS — I5032 Chronic diastolic (congestive) heart failure: Secondary | ICD-10-CM | POA: Diagnosis not present

## 2021-06-21 DIAGNOSIS — N1832 Chronic kidney disease, stage 3b: Secondary | ICD-10-CM | POA: Diagnosis not present

## 2021-06-21 DIAGNOSIS — G4730 Sleep apnea, unspecified: Secondary | ICD-10-CM | POA: Diagnosis not present

## 2021-06-21 DIAGNOSIS — Z87891 Personal history of nicotine dependence: Secondary | ICD-10-CM | POA: Diagnosis not present

## 2021-06-21 DIAGNOSIS — I50812 Chronic right heart failure: Secondary | ICD-10-CM | POA: Diagnosis not present

## 2021-06-27 DIAGNOSIS — J449 Chronic obstructive pulmonary disease, unspecified: Secondary | ICD-10-CM | POA: Diagnosis not present

## 2021-06-27 DIAGNOSIS — I272 Pulmonary hypertension, unspecified: Secondary | ICD-10-CM | POA: Diagnosis not present

## 2021-06-27 DIAGNOSIS — I5032 Chronic diastolic (congestive) heart failure: Secondary | ICD-10-CM | POA: Diagnosis not present

## 2021-06-27 DIAGNOSIS — I50812 Chronic right heart failure: Secondary | ICD-10-CM | POA: Diagnosis not present

## 2021-06-27 DIAGNOSIS — I130 Hypertensive heart and chronic kidney disease with heart failure and stage 1 through stage 4 chronic kidney disease, or unspecified chronic kidney disease: Secondary | ICD-10-CM | POA: Diagnosis not present

## 2021-06-27 DIAGNOSIS — N1832 Chronic kidney disease, stage 3b: Secondary | ICD-10-CM | POA: Diagnosis not present

## 2021-06-28 ENCOUNTER — Telehealth: Payer: Self-pay | Admitting: Pulmonary Disease

## 2021-06-28 NOTE — Telephone Encounter (Signed)
Spoke to patient, who stated that he does not wear a trilogy machine. He has not been contacted in regards to a machine and he does not wish to wear a trilogy machine.   Spoke to [place] with [employer] and confirmed patient name and DOB.  Caryl Pina stated that sign Rx was received this morning and was signed by [name]. I requested that Caryl Pina cancel order.  Routing back to [name] as an Juluis Rainier.

## 2021-06-28 NOTE — Telephone Encounter (Signed)
Insurance is wanting to have a note placed in the chart stating that the pt had tried cpap and bipap  Amendment needs to say:  What the pt has--chronic resp Non invasive ventilator to reduce hospitalization and sustain life   Will route to RA to have this done and we can call Caryl Pina back once completed and let him know when it is done.  thanks

## 2021-06-29 NOTE — Telephone Encounter (Signed)
I have called Joshua [name] and he is aware.  Nothing further is needed.

## 2021-07-02 ENCOUNTER — Other Ambulatory Visit: Payer: Self-pay | Admitting: Gastroenterology

## 2021-07-02 DIAGNOSIS — K219 Gastro-esophageal reflux disease without esophagitis: Secondary | ICD-10-CM | POA: Diagnosis not present

## 2021-07-02 DIAGNOSIS — R634 Abnormal weight loss: Secondary | ICD-10-CM | POA: Diagnosis not present

## 2021-07-02 DIAGNOSIS — R066 Hiccough: Secondary | ICD-10-CM | POA: Diagnosis not present

## 2021-07-04 DIAGNOSIS — J449 Chronic obstructive pulmonary disease, unspecified: Secondary | ICD-10-CM | POA: Diagnosis not present

## 2021-07-04 DIAGNOSIS — N1832 Chronic kidney disease, stage 3b: Secondary | ICD-10-CM | POA: Diagnosis not present

## 2021-07-04 DIAGNOSIS — I50812 Chronic right heart failure: Secondary | ICD-10-CM | POA: Diagnosis not present

## 2021-07-04 DIAGNOSIS — I272 Pulmonary hypertension, unspecified: Secondary | ICD-10-CM | POA: Diagnosis not present

## 2021-07-04 DIAGNOSIS — I5032 Chronic diastolic (congestive) heart failure: Secondary | ICD-10-CM | POA: Diagnosis not present

## 2021-07-04 DIAGNOSIS — I130 Hypertensive heart and chronic kidney disease with heart failure and stage 1 through stage 4 chronic kidney disease, or unspecified chronic kidney disease: Secondary | ICD-10-CM | POA: Diagnosis not present

## 2021-07-05 ENCOUNTER — Other Ambulatory Visit: Payer: Self-pay

## 2021-07-05 NOTE — Anesthesia Preprocedure Evaluation (Addendum)
Anesthesia Evaluation  Patient identified by MRN, date of birth, ID band Patient awake    Reviewed: Allergy & Precautions, NPO status , Patient's Chart, lab work & pertinent test results  History of Anesthesia Complications Negative for: history of anesthetic complications  Airway Mallampati: II  TM Distance: >3 FB Neck ROM: Full    Dental  (+) Upper Dentures, Lower Dentures   Pulmonary sleep apnea , COPD,  oxygen dependent, former smoker, PE   Pulmonary exam normal        Cardiovascular hypertension, pulmonary hypertension+CHF (RV failure, diastolic dysfunction)  Normal cardiovascular exam   Echo 09/29/20: EF 55-60%, mild LVH, flattened interventricular septum c/w RV pressure/volume overload, severely reduced RV systolic fn, severe RV enlargement, mod pulm HTN (RVSP 56), mild-mod TR, mild aortic root dilatation (40 mm)  Cath 12/13/20:  1. Normal right and left heart filling pressures.  2. Moderate pulmonary arterial hypertension.  3. Low but not markedly low cardiac output.   Stress test 2019: Low risk study, no ischemia   Neuro/Psych negative neurological ROS     GI/Hepatic Neg liver ROS, GERD  ,  Endo/Other  negative endocrine ROS  Renal/GU CRFRenal disease (CKD3, Cr 1.34)  negative genitourinary   Musculoskeletal  (+) Arthritis ,   Abdominal   Peds  Hematology Eliquis   Anesthesia Other Findings   Reproductive/Obstetrics                          Anesthesia Physical Anesthesia Plan  ASA: 4  Anesthesia Plan: MAC   Post-op Pain Management:    Induction: Intravenous  PONV Risk Score and Plan: 1 and Propofol infusion, TIVA and Treatment may vary due to age or medical condition  Airway Management Planned: Natural Airway, Nasal Cannula and Simple Face Mask  Additional Equipment: None  Intra-op Plan:   Post-operative Plan:   Informed Consent: I have reviewed the patients History  and Physical, chart, labs and discussed the procedure including the risks, benefits and alternatives for the proposed anesthesia with the patient or authorized representative who has indicated his/her understanding and acceptance.       Plan Discussed with:   Anesthesia Plan Comments:        Anesthesia Quick Evaluation

## 2021-07-06 ENCOUNTER — Other Ambulatory Visit: Payer: Self-pay

## 2021-07-06 ENCOUNTER — Encounter (HOSPITAL_COMMUNITY): Payer: Self-pay | Admitting: Gastroenterology

## 2021-07-06 ENCOUNTER — Ambulatory Visit (HOSPITAL_COMMUNITY)
Admission: RE | Admit: 2021-07-06 | Discharge: 2021-07-06 | Disposition: A | Discharge status: Home / Self Care | Payer: Medicare Other | Attending: Gastroenterology | Admitting: Gastroenterology

## 2021-07-06 ENCOUNTER — Ambulatory Visit (HOSPITAL_COMMUNITY): Payer: Medicare Other | Admitting: Anesthesiology

## 2021-07-06 ENCOUNTER — Encounter (HOSPITAL_COMMUNITY)
Admission: RE | Disposition: A | Discharge status: Home / Self Care | Payer: Self-pay | Source: Home / Self Care | Attending: Gastroenterology

## 2021-07-06 DIAGNOSIS — G4733 Obstructive sleep apnea (adult) (pediatric): Secondary | ICD-10-CM | POA: Diagnosis not present

## 2021-07-06 DIAGNOSIS — K219 Gastro-esophageal reflux disease without esophagitis: Secondary | ICD-10-CM | POA: Diagnosis not present

## 2021-07-06 DIAGNOSIS — K2210 Ulcer of esophagus without bleeding: Secondary | ICD-10-CM | POA: Diagnosis not present

## 2021-07-06 DIAGNOSIS — Z9981 Dependence on supplemental oxygen: Secondary | ICD-10-CM | POA: Diagnosis not present

## 2021-07-06 DIAGNOSIS — Z87891 Personal history of nicotine dependence: Secondary | ICD-10-CM | POA: Diagnosis not present

## 2021-07-06 DIAGNOSIS — K449 Diaphragmatic hernia without obstruction or gangrene: Secondary | ICD-10-CM | POA: Diagnosis not present

## 2021-07-06 DIAGNOSIS — B49 Unspecified mycosis: Secondary | ICD-10-CM | POA: Diagnosis not present

## 2021-07-06 DIAGNOSIS — I130 Hypertensive heart and chronic kidney disease with heart failure and stage 1 through stage 4 chronic kidney disease, or unspecified chronic kidney disease: Secondary | ICD-10-CM | POA: Diagnosis not present

## 2021-07-06 DIAGNOSIS — N138 Other obstructive and reflux uropathy: Secondary | ICD-10-CM | POA: Diagnosis not present

## 2021-07-06 DIAGNOSIS — R112 Nausea with vomiting, unspecified: Secondary | ICD-10-CM | POA: Diagnosis not present

## 2021-07-06 DIAGNOSIS — J449 Chronic obstructive pulmonary disease, unspecified: Secondary | ICD-10-CM | POA: Insufficient documentation

## 2021-07-06 DIAGNOSIS — I272 Pulmonary hypertension, unspecified: Secondary | ICD-10-CM | POA: Insufficient documentation

## 2021-07-06 DIAGNOSIS — R634 Abnormal weight loss: Secondary | ICD-10-CM | POA: Diagnosis not present

## 2021-07-06 DIAGNOSIS — G4730 Sleep apnea, unspecified: Secondary | ICD-10-CM | POA: Diagnosis not present

## 2021-07-06 DIAGNOSIS — I509 Heart failure, unspecified: Secondary | ICD-10-CM | POA: Insufficient documentation

## 2021-07-06 DIAGNOSIS — N183 Chronic kidney disease, stage 3 unspecified: Secondary | ICD-10-CM | POA: Insufficient documentation

## 2021-07-06 DIAGNOSIS — Z86711 Personal history of pulmonary embolism: Secondary | ICD-10-CM | POA: Diagnosis not present

## 2021-07-06 DIAGNOSIS — R066 Hiccough: Secondary | ICD-10-CM | POA: Diagnosis not present

## 2021-07-06 DIAGNOSIS — Z6824 Body mass index (BMI) 24.0-24.9, adult: Secondary | ICD-10-CM | POA: Insufficient documentation

## 2021-07-06 DIAGNOSIS — K2100 Gastro-esophageal reflux disease with esophagitis, without bleeding: Secondary | ICD-10-CM | POA: Diagnosis not present

## 2021-07-06 DIAGNOSIS — N401 Enlarged prostate with lower urinary tract symptoms: Secondary | ICD-10-CM | POA: Diagnosis not present

## 2021-07-06 HISTORY — PX: ESOPHAGOGASTRODUODENOSCOPY (EGD) WITH PROPOFOL: SHX5813

## 2021-07-06 HISTORY — PX: BIOPSY: SHX5522

## 2021-07-06 SURGERY — ESOPHAGOGASTRODUODENOSCOPY (EGD) WITH PROPOFOL
Anesthesia: Monitor Anesthesia Care

## 2021-07-06 MED ORDER — PROPOFOL 10 MG/ML IV BOLUS
INTRAVENOUS | Status: DC | PRN
  Administered 2021-07-06: 10 mg via INTRAVENOUS

## 2021-07-06 MED ORDER — PHENYLEPHRINE 40 MCG/ML (10ML) SYRINGE FOR IV PUSH (FOR BLOOD PRESSURE SUPPORT)
PREFILLED_SYRINGE | INTRAVENOUS | Status: DC | PRN
  Administered 2021-07-06 (×2): 80 ug via INTRAVENOUS
  Administered 2021-07-06: 40 ug via INTRAVENOUS
  Administered 2021-07-06: 80 ug via INTRAVENOUS

## 2021-07-06 MED ORDER — SODIUM CHLORIDE 0.9 % IV SOLN: INTRAVENOUS | Status: DC

## 2021-07-06 MED ORDER — PROPOFOL 1000 MG/100ML IV EMUL
INTRAVENOUS | Status: AC
  Filled 2021-07-06: qty 100

## 2021-07-06 MED ORDER — PROPOFOL 500 MG/50ML IV EMUL
INTRAVENOUS | Status: DC | PRN
  Administered 2021-07-06: 110 ug/kg/min via INTRAVENOUS

## 2021-07-06 MED ORDER — LACTATED RINGERS IV SOLN: INTRAVENOUS | Status: DC | PRN

## 2021-07-06 MED ORDER — LIDOCAINE 2% (20 MG/ML) 5 ML SYRINGE
INTRAMUSCULAR | Status: DC | PRN
  Administered 2021-07-06: 40 mg via INTRAVENOUS

## 2021-07-06 SURGICAL SUPPLY — 14 items

## 2021-07-06 NOTE — Op Note (Signed)
[facility] Patient Name: Joshua [name] Procedure Date: 07/06/2021 MRN: [id number] Attending MD: Carol Ada , MD Date of Birth: 1935/10/18 CSN: [id number] Age: 85 Admit Type: Outpatient Procedure:                Upper GI endoscopy Indications:              Nausea with vomiting, Weight loss Providers:                Carol Ada, MD, Doristine Johns, RN, Cletis Athens, Technician, Lodema Hong Technician,                            [employer]chant [employer] officer Referring MD:              Medicines:                Propofol per Anesthesia Complications:            No immediate complications. Estimated Blood Loss:     Estimated blood loss was minimal. Procedure:                Pre-Anesthesia Assessment:                           - Prior to the procedure, a History and Physical                            was performed, and patient medications and                            allergies were reviewed. The patient's tolerance of                            previous anesthesia was also reviewed. The risks                            and benefits of the procedure and the sedation                            options and risks were discussed with the patient.                            All questions were answered, and informed consent                            was obtained. Prior Anticoagulants: The patient has                            taken no previous anticoagulant or antiplatelet                            agents. ASA Grade Assessment: III - A patient with  severe systemic disease. After reviewing the risks                            and benefits, the patient was deemed in                            satisfactory condition to undergo the procedure.                           - Sedation was administered by an anesthesia                            professional. Deep sedation was attained.                           After obtaining informed consent,  the endoscope was                            passed under direct vision. Throughout the                            procedure, the patient's blood pressure, pulse, and                            oxygen saturations were monitored continuously. The                            [id number]-H190 ([id number]) Olympus endoscope was introduced                            through the mouth, and advanced to the second part                            of duodenum. The upper GI endoscopy was                            accomplished without difficulty. The patient                            tolerated the procedure well. Scope In: Scope Out: Findings:      LA Grade D (one or more mucosal breaks involving at least 75% of       esophageal circumference) esophagitis with no bleeding was found in the       entire esophagus. Biopsies were taken with a cold forceps for histology.      A 4 cm hiatal hernia was present.      The gastroesophageal flap valve was visualized endoscopically and       classified as Hill Grade IV (no fold, wide open lumen, hiatal hernia       present).      The stomach was normal.      The second portion of the duodenum was normal.      A severe esophagitis was identified in the entire esophagus. Grossly it       did not appear to be consistent with a reflux esophagitis. A  viral       etiology may be a source. Cold biopsies were obtained. The third portion       of the duodenum was not able to be reached as the patient had a large       capacitance in his stomach. The length of the endoscope was not able to       reach any further in his duodenum. Impression:               - LA Grade D non-reflux esophagitis with no                            bleeding. Biopsied.                           - 4 cm hiatal hernia.                           - Gastroesophageal flap valve classified as Hill                            Grade IV (no fold, wide open lumen, hiatal hernia                             present).                           - Normal stomach.                           - Normal second portion of the duodenum. Moderate Sedation:      Not Applicable - Patient had care per Anesthesia. Recommendation:           - Patient has a contact number available for                            emergencies. The signs and symptoms of potential                            delayed complications were discussed with the                            patient. Return to normal activities tomorrow.                            Written discharge instructions were provided to the                            patient.                           - Resume previous diet.                           - Continue present medications.                           - Await pathology results.                           -  Return to GI clinic in 4 weeks. Procedure Code(s):        --- Professional ---                           [phone], Esophagogastroduodenoscopy, flexible,                            transoral; with biopsy, single or multiple Diagnosis Code(s):        --- Professional ---                           K20.80, Other esophagitis without bleeding                           K44.9, Diaphragmatic hernia without obstruction or                            gangrene                           R11.2, Nausea with vomiting, unspecified                           R63.4, Abnormal weight loss CPT copyright 2019 [employer]. All rights reserved. The codes documented in this report are preliminary and upon coder review may  be revised to meet current compliance requirements. Carol Ada, MD Carol Ada, MD 07/06/2021 9:26:20 AM This report has been signed electronically. Number of Addenda: 0

## 2021-07-06 NOTE — Discharge Instructions (Signed)

## 2021-07-06 NOTE — Transfer of Care (Signed)
Immediate Anesthesia Transfer of Care Note  Patient: Joshua [name]  Procedure(s) Performed: ESOPHAGOGASTRODUODENOSCOPY (EGD) WITH PROPOFOL BIOPSY  Patient Location: PACU and Endoscopy Unit  Anesthesia Type:MAC  Level of Consciousness: awake, alert , oriented and patient cooperative  Airway & Oxygen Therapy: Patient Spontanous Breathing and Patient connected to face mask oxygen  Post-op Assessment: Report given to RN, Post -op Vital signs reviewed and stable and Patient moving all extremities  Post vital signs: Reviewed and stable  Last Vitals:  Vitals Value Taken Time  BP 85/50 07/06/21 0927  Temp    Pulse 78 07/06/21 0929  Resp 17 07/06/21 0929  SpO2 94 % 07/06/21 0929  Vitals shown include unvalidated device data.  Last Pain:  Vitals:   07/06/21 0828  TempSrc: Oral  PainSc: 0-No pain         Complications: No notable events documented.

## 2021-07-06 NOTE — Anesthesia Postprocedure Evaluation (Signed)
Anesthesia Post Note  Patient: Joshua [name]  Procedure(s) Performed: ESOPHAGOGASTRODUODENOSCOPY (EGD) WITH PROPOFOL BIOPSY     Patient location during evaluation: Endoscopy Anesthesia Type: MAC Level of consciousness: awake and alert Pain management: pain level controlled Vital Signs Assessment: post-procedure vital signs reviewed and stable Respiratory status: spontaneous breathing, nonlabored ventilation, respiratory function stable and patient connected to nasal cannula oxygen Cardiovascular status: blood pressure returned to baseline and stable Postop Assessment: no apparent nausea or vomiting Anesthetic complications: no   No notable events documented.  Last Vitals:  Vitals:   07/06/21 0941 07/06/21 0951  BP: 102/66 107/64  Pulse: 75 73  Resp: 11 11  Temp:    SpO2: 95% 94%    Last Pain:  Vitals:   07/06/21 0951  TempSrc:   PainSc: 0-No pain                 Lidia Collum

## 2021-07-06 NOTE — H&P (Signed)
Joshua [name] HPI:  He is feeling better, but clinically there is no significant improvement in his symptoms.  Singultus is still a problem for him.  Since the last visit he lost 6 lbs.  Currently he feels as if there is a burning in his chest and there is regurgitation.  His EGD 10/2020 showed that he had a candidal esophagitis.   Past Medical History:  Diagnosis Date   Arthritis    BPH (benign prostatic hyperplasia)    COPD (chronic obstructive pulmonary disease) (HCC)    Folliculitis    posterior scalp per office visit note of Dr Joshua [name] 07/20/2014     GERD (gastroesophageal reflux disease)    Hypertension    PE (pulmonary thromboembolism) (Joshua [name])    Phlebitis    right arm  at least 20 years ago    Pneumonia    hx of pneumonia as a child    Sleep apnea     Past Surgical History:  Procedure Laterality Date   BIOPSY  11/03/2020   Procedure: BIOPSY;  Surgeon: Joshua Ada, MD;  Location: [facility];  Service: Endoscopy;;   bone removed from little toe right foot      CHOLECYSTECTOMY     ESOPHAGOGASTRODUODENOSCOPY (EGD) WITH PROPOFOL N/A 11/03/2020   Procedure: ESOPHAGOGASTRODUODENOSCOPY (EGD) WITH PROPOFOL;  Surgeon: Joshua Ada, MD;  Location: [facility];  Service: Endoscopy;  Laterality: N/A;   pilonidal cyst removal      RIGHT HEART CATH N/A 12/13/2020   Procedure: RIGHT HEART CATH;  Surgeon: Joshua Dresser, MD;  Location: [facility];  Service: Cardiovascular;  Laterality: N/A;   TOTAL KNEE ARTHROPLASTY Left 09/13/2014   Procedure: LEFT TOTAL KNEE ARTHROPLASTY;  Surgeon: Joshua Pole, MD;  Location: [facility];  Service: Orthopedics;  Laterality: Left;    Family History  Problem Relation Age of Onset   Heart attack Brother 44    Social History:  reports that he quit smoking about 14 years ago. His smoking use included cigarettes. He started smoking about 66 years ago. He has a 50.00 pack-year smoking history. He has never used smokeless tobacco. He reports that he  does not currently use alcohol after a past usage of about 6.0 standard drinks per week. He reports that he does not use drugs.  Allergies:  Allergies  Allergen Reactions   Other Swelling    Beer- Swelling    Sunflower Oil Swelling   Sulfa Antibiotics Rash    Medications: Scheduled: Continuous:  No results found for this or any previous visit (from the past 24 hour(s)).   No results found.  ROS:  As stated above in the HPI otherwise negative.  Height _0  (1.905 m), weight 88.9 kg.    PE: Gen: NAD, Alert and Oriented HEENT:  [place]/AT, EOMI Neck: Supple, no LAD Lungs: CTA Bilaterally CV: RRR without M/G/R ABD: Soft, NTND, +BS Ext: No C/C/E  Assessment/Plan: 1) Nausea/Vomiting. 2) Weight loss.  Plan: 1) EGD.  Joshua [name] D 07/06/2021, 8:08 AM

## 2021-07-09 ENCOUNTER — Encounter (HOSPITAL_COMMUNITY): Payer: Self-pay | Admitting: Gastroenterology

## 2021-07-09 DIAGNOSIS — I130 Hypertensive heart and chronic kidney disease with heart failure and stage 1 through stage 4 chronic kidney disease, or unspecified chronic kidney disease: Secondary | ICD-10-CM | POA: Diagnosis not present

## 2021-07-09 DIAGNOSIS — J449 Chronic obstructive pulmonary disease, unspecified: Secondary | ICD-10-CM | POA: Diagnosis not present

## 2021-07-09 DIAGNOSIS — I272 Pulmonary hypertension, unspecified: Secondary | ICD-10-CM | POA: Diagnosis not present

## 2021-07-09 DIAGNOSIS — I5032 Chronic diastolic (congestive) heart failure: Secondary | ICD-10-CM | POA: Diagnosis not present

## 2021-07-09 DIAGNOSIS — N1832 Chronic kidney disease, stage 3b: Secondary | ICD-10-CM | POA: Diagnosis not present

## 2021-07-09 DIAGNOSIS — I50812 Chronic right heart failure: Secondary | ICD-10-CM | POA: Diagnosis not present

## 2021-07-10 ENCOUNTER — Other Ambulatory Visit: Payer: Self-pay

## 2021-07-10 ENCOUNTER — Encounter: Payer: Self-pay | Admitting: Podiatry

## 2021-07-10 ENCOUNTER — Ambulatory Visit (INDEPENDENT_AMBULATORY_CARE_PROVIDER_SITE_OTHER): Payer: Medicare Other | Admitting: Podiatry

## 2021-07-10 DIAGNOSIS — M79609 Pain in unspecified limb: Secondary | ICD-10-CM

## 2021-07-10 DIAGNOSIS — B351 Tinea unguium: Secondary | ICD-10-CM

## 2021-07-10 DIAGNOSIS — D689 Coagulation defect, unspecified: Secondary | ICD-10-CM | POA: Diagnosis not present

## 2021-07-10 LAB — SURGICAL PATHOLOGY

## 2021-07-10 NOTE — Progress Notes (Signed)
  Subjective:  Patient ID: Joshua [name], male    DOB: Mar 03, 1936,   MRN: [id number]  No chief complaint on file.   85 y.o. male presents for elongated thickened and painful toenails. Patient relates he is unable to trim them himself. He  is not diabetic but does have a history of CHF and is on blood thinners. Currently taking eliquis . Denies any other pedal complaints. Denies n/v/f/c.   Past Medical History:  Diagnosis Date   Arthritis    BPH (benign prostatic hyperplasia)    COPD (chronic obstructive pulmonary disease) (HCC)    Folliculitis    posterior scalp per office visit note of Dr Maudie Mercury 07/20/2014     GERD (gastroesophageal reflux disease)    Hypertension    PE (pulmonary thromboembolism) ([place])    Phlebitis    right arm  at least 20 years ago    Pneumonia    hx of pneumonia as a child    Sleep apnea     Objective:  Physical Exam: Vascular: DP/PT pulses 2/4 bilateral. CFT <3 seconds. Normal hair growth on digits. No edema.  Skin. No lacerations or abrasions bilateral feet. Nails 1-5 are thickened discolored and elongated with subungual debris. Hyperkeratotic cored lesion noted to dorsal fourth digit on right.  Musculoskeletal: MMT 5/5 bilateral lower extremities in DF, PF, Inversion and Eversion. Deceased ROM in DF of ankle joint.  Neurological: Sensation intact to light touch.   Assessment:   1. Onychomycosis   2. Coagulation disorder (HCC)   3. Pain due to onychomycosis of nail      Plan:  Patient was evaluated and treated and all questions answered. -Discussed and educated patient on foot care, especially with  regards to the vascular, neurological and musculoskeletal systems.  -Discussed supportive shoes at all times and checking feet regularly.  -Mechanically debrided all nails 1-5 bilateral using sterile nail nipper and filed with dremel without incident  -Hyperkeratotic corn trimmed as courtesy.  -Answered all patient questions -Patient to return  in 3  months for at risk foot care -Patient advised to call the office if any problems or questions arise in the meantime.   Lorenda Peck, DPM

## 2021-07-14 ENCOUNTER — Other Ambulatory Visit (HOSPITAL_COMMUNITY): Payer: Self-pay | Admitting: Family Medicine

## 2021-07-15 ENCOUNTER — Other Ambulatory Visit (HOSPITAL_COMMUNITY): Payer: Self-pay | Admitting: Cardiology

## 2021-07-17 ENCOUNTER — Other Ambulatory Visit (HOSPITAL_COMMUNITY): Payer: Self-pay | Admitting: Cardiology

## 2021-07-17 ENCOUNTER — Ambulatory Visit (HOSPITAL_COMMUNITY)
Admission: RE | Admit: 2021-07-17 | Discharge: 2021-07-17 | Disposition: A | Discharge status: Home / Self Care | Payer: Medicare Other | Source: Ambulatory Visit | Attending: Cardiology | Admitting: Cardiology

## 2021-07-17 ENCOUNTER — Encounter (HOSPITAL_COMMUNITY): Payer: Self-pay

## 2021-07-17 ENCOUNTER — Other Ambulatory Visit: Payer: Self-pay

## 2021-07-17 ENCOUNTER — Telehealth (HOSPITAL_COMMUNITY): Payer: Self-pay

## 2021-07-17 VITALS — BP 89/62 | HR 91 | Wt 193.4 lb

## 2021-07-17 DIAGNOSIS — Z86711 Personal history of pulmonary embolism: Secondary | ICD-10-CM | POA: Insufficient documentation

## 2021-07-17 DIAGNOSIS — J439 Emphysema, unspecified: Secondary | ICD-10-CM | POA: Insufficient documentation

## 2021-07-17 DIAGNOSIS — I5032 Chronic diastolic (congestive) heart failure: Secondary | ICD-10-CM | POA: Diagnosis not present

## 2021-07-17 DIAGNOSIS — I2724 Chronic thromboembolic pulmonary hypertension: Secondary | ICD-10-CM | POA: Diagnosis not present

## 2021-07-17 DIAGNOSIS — I130 Hypertensive heart and chronic kidney disease with heart failure and stage 1 through stage 4 chronic kidney disease, or unspecified chronic kidney disease: Secondary | ICD-10-CM | POA: Diagnosis not present

## 2021-07-17 DIAGNOSIS — Z7901 Long term (current) use of anticoagulants: Secondary | ICD-10-CM | POA: Insufficient documentation

## 2021-07-17 DIAGNOSIS — Z87891 Personal history of nicotine dependence: Secondary | ICD-10-CM | POA: Diagnosis not present

## 2021-07-17 DIAGNOSIS — R296 Repeated falls: Secondary | ICD-10-CM | POA: Insufficient documentation

## 2021-07-17 DIAGNOSIS — I50812 Chronic right heart failure: Secondary | ICD-10-CM

## 2021-07-17 DIAGNOSIS — J432 Centrilobular emphysema: Secondary | ICD-10-CM

## 2021-07-17 DIAGNOSIS — G4733 Obstructive sleep apnea (adult) (pediatric): Secondary | ICD-10-CM | POA: Insufficient documentation

## 2021-07-17 DIAGNOSIS — R55 Syncope and collapse: Secondary | ICD-10-CM

## 2021-07-17 DIAGNOSIS — Z9981 Dependence on supplemental oxygen: Secondary | ICD-10-CM | POA: Insufficient documentation

## 2021-07-17 DIAGNOSIS — R531 Weakness: Secondary | ICD-10-CM | POA: Diagnosis not present

## 2021-07-17 DIAGNOSIS — R066 Hiccough: Secondary | ICD-10-CM | POA: Diagnosis not present

## 2021-07-17 DIAGNOSIS — R627 Adult failure to thrive: Secondary | ICD-10-CM | POA: Insufficient documentation

## 2021-07-17 DIAGNOSIS — Z9181 History of falling: Secondary | ICD-10-CM | POA: Insufficient documentation

## 2021-07-17 DIAGNOSIS — N183 Chronic kidney disease, stage 3 unspecified: Secondary | ICD-10-CM | POA: Insufficient documentation

## 2021-07-17 DIAGNOSIS — K209 Esophagitis, unspecified without bleeding: Secondary | ICD-10-CM | POA: Diagnosis not present

## 2021-07-17 DIAGNOSIS — K449 Diaphragmatic hernia without obstruction or gangrene: Secondary | ICD-10-CM | POA: Insufficient documentation

## 2021-07-17 DIAGNOSIS — Z79899 Other long term (current) drug therapy: Secondary | ICD-10-CM | POA: Insufficient documentation

## 2021-07-17 DIAGNOSIS — I272 Pulmonary hypertension, unspecified: Secondary | ICD-10-CM | POA: Diagnosis not present

## 2021-07-17 LAB — COMPREHENSIVE METABOLIC PANEL
ALT: 15 U/L (ref 0–44)
AST: 26 U/L (ref 15–41)
Albumin: 3.1 g/dL — ABNORMAL LOW (ref 3.5–5.0)
Alkaline Phosphatase: 75 U/L (ref 38–126)
Anion gap: 8 (ref 5–15)
BUN: 29 mg/dL — ABNORMAL HIGH (ref 8–23)
CO2: 43 mmol/L — ABNORMAL HIGH (ref 22–32)
Calcium: 9.8 mg/dL (ref 8.9–10.3)
Chloride: 86 mmol/L — ABNORMAL LOW (ref 98–111)
Creatinine, Ser: 2.13 mg/dL — ABNORMAL HIGH (ref 0.61–1.24)
GFR, Estimated: 30 mL/min — ABNORMAL LOW (ref >60)
Glucose, Bld: 140 mg/dL — ABNORMAL HIGH (ref 70–99)
Potassium: 3.3 mmol/L — ABNORMAL LOW (ref 3.5–5.1)
Sodium: 137 mmol/L (ref 135–145)
Total Bilirubin: 0.7 mg/dL (ref 0.3–1.2)
Total Protein: 6.8 g/dL (ref 6.5–8.1)

## 2021-07-17 LAB — AMMONIA: Ammonia: <10 umol/L (ref 9–35)

## 2021-07-17 MED ORDER — TORSEMIDE 20 MG PO TABS: 40.0000 mg | ORAL_TABLET | Freq: Every day | ORAL | 3 refills | Status: DC

## 2021-07-17 NOTE — Patient Instructions (Addendum)
DECREASE Torsemide to 40 mg daily STOP Farxiga  Your provider has recommended that  you wear a Zio Patch for 3 days.  This monitor will record your heart rhythm for our review.  IF you have any symptoms while wearing the monitor please press the button.  If you have any issues with the patch or you notice a red or orange light on it please call the company at [phone].  Once you remove the patch please mail it back to the company as soon as possible so we can get the results.   Labs today We will only contact you if something comes back abnormal or we need to make some changes. Otherwise no news is good news!  You have been referred to [facility] -they will be in touch for a appointment  Please wear your compression hose daily, place them on as soon as you get up in the morning and remove before you go to bed at night.  Please wear your abdominal binder daily and remove before you go to bed.  Your physician recommends that you schedule a follow-up appointment in: 2 weeks  in the Advanced Practitioners (PA/NP) Clinic and  8 weeks with Dr Aundra Dubin  Do the following things EVERYDAY: Weigh yourself in the morning before breakfast. Write it down and keep it in a log. Take your medicines as prescribed Eat low salt foods--Limit salt (sodium) to 2000 mg per day.  Stay as active as you can everyday Limit all fluids for the day to less than 2 liters  At the [facility], you and your health needs are our priority. As part of our continuing mission to provide you with exceptional heart care, we have created designated Provider Care Teams. These Care Teams include your primary Cardiologist (physician) and Advanced Practice Providers (APPs- Physician Assistants and Nurse Practitioners) who all work together to provide you with the care you need, when you need it.   You may see any of the following providers on your designated Care Team at your next follow up: Dr  Glori Bickers Dr Haynes Kerns, NP Lyda Jester, [place] [facility] [place], [place] Audry Riles, PharmD   Please be sure to bring in all your medications bottles to every appointment.    If you have any questions or concerns before your next appointment please send [place] a message through [place] or call our office at [phone].    TO LEAVE A MESSAGE FOR THE NURSE SELECT OPTION 2, PLEASE LEAVE A MESSAGE INCLUDING: YOUR NAME DATE OF BIRTH CALL BACK NUMBER REASON FOR CALL**this is important as we prioritize the call backs  YOU WILL RECEIVE A CALL BACK THE SAME DAY AS LONG AS YOU CALL BEFORE 4:00 PM

## 2021-07-17 NOTE — Progress Notes (Signed)
PCP: [name], DO Cardiology: Dr. Burt Knack HF Cardiology: Dr. Aundra Dubin  85 y.o. with history of HFPEF, prior PE, and pulmonary hypertension was referred by Dr. Gasper Sells for evaluation of pulmonary hypertension.  Patient had a PE in 2016.  He is on apixaban. He has a long history of diastolic CHF.  Most recent echo showed a significant component of RV failure with EF 55-60%, IV septum flattened, severe RV enlargement, severely decreased RV function, PASP 57 mmHg.  CTA chest in 4/22 showed no acute PE and mild emphysema, but V/Q scan in 5/22 was suggestive of chronic PE in the right middle lobe.  RHC in 4/22 showed normal filling pressures with moderate PAH. He additionally has chronic hiccups followed by Dr. Benson Norway, now on gabapentin. PFTs in 6/22 showed mixed picture with severe obstruction, moderate restriction, and severely decreased DLCO.   Admitted 8/25-8/30/22 with A/C HF exacerbation. He was aggressively diuresed with lasix/metolazone, had unna boots, farxiga added.  Midodrine was increased for hypotension. Hospitalization c/b AKI on CKD III.  Readmitted 9/1-9/12/22 with shock (mixed septic and cardiogenic), likely 2/2 to aspiration pneumonia and A/C CHF.  Suspect possible gut translocation with ileus/partial SBO; CT abdomen with "nutcracker phenomenon".  He was started on NE + antibiotics. General surgery consulted and felt not to be a surgical candidate. Diuretics initially held due to over-diuresis and shock. Eventually able to wean pressors off, restart PO torsemide and midodrine. Hospitalization c/b transaminitis, delirium, and BPH with urinary retention, requiring foley catheter. Palliative care was consulted for [place] and patient DNR/DNI. PT/OT recommended HH. He was discharged home, weight 220 lbs.  EGD 11/22 showed esophagitis, hiatal hernia and yeast.   Today he returns for acute visit with his wife and daughter. He fell on Thanksgiving and is more shaky per wife and daughter. Patient  says these episodes are more frequent. He remembers these episodes and he mostly says he feels weak. He is using a cane and walker to get around at home. He is more SOB with activity and continuous to wear oxygen. Denies CP, palpitations, edema, or PND/Orthopnea. Appetite poor, has been off his PPI and recently resumed. Vomiting has lessened and is taking fluconazole for esophageal yeast. No fever or chills. Weight at home 188 pounds. Taking all medications. Continues to self cath 3-4x/day.  ECG (personally reviewed): none ordered today.  Labs (4/22): K 4.8, creatinine 1.67 Labs (6/22): K 3.8, creatinine 1.72, ANA negative, RF 39.6 but CCP negative, SCL-70 negative, BNP 1231, HIV negative Labs (7/22): K 3.8, creatine 1.67 Labs (8/22): K 3.6, creatinine 1.6, hgb 15.3  Labs (9/22): K 5.0, creatinine 1.2, hgb 14.6 Labs (10/22): K 3.4, creatinine 1.37 Labs (10/22): K 3.5, creatinine 1.34  REDS clip 27%  6 minute walk (7/22): 213 m 6 minute walk (10/22): 274 m (oxygen saturation dropped to 70s on 2L [place])  PMH: 1. HFPEF: With prominent RV failure.   - Echo (2/22): EF 55-60%, IV septum flattened, severe RV enlargement, severely decreased RV function, PASP 57 mmHg.  2. Venous thromboembolic disease: PE in 7564.   - Venous dopplers (4/22): No DVT.  - CTA chest (4/22): No PE.  - V/Q scan 5/22 with perfusion defect in the RML consistent with chronic PE.  3. OSA: Does not tolerate CPAP.  4. HTN 5. COPD: Prior smoker.   - CTA chest in 4/22 showed no PE, mild emphysema.  - PFTs (6/22) with severe obstruction, moderate restriction, severely decreased DLCO 6. Pulmonary hypertension: RHC (4/22) with mean RA 5,  PA 65/19 mean 36, mean PCWP 5, CI 2.19, PVR 6.1 WU, PAPi 9.2.  7. Chronic hiccups 8. BPH: Has to in and out cath at times. 9. Syncope: Zio 2 week mostly SR, BBB/IVCD present, 2 episodes of VT occurred, fastest 10 beats w/ max rate of 160 bpm, 17 SVT runs with fastest interval 9.7 seconds with max  rate of 218  10. Chronic hiccups 11. 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  Social History   Socioeconomic History   Marital status: Married    Spouse name: Joann   Number of children: Not on file   Years of education: Not on file   Highest education level: Not on file  Occupational History   Not on file  Tobacco Use   Smoking status: Former    Packs/day: 1.00    Years: 50.00    Pack years: 50.00    Types: Cigarettes    Start date: 79    Quit date: 08/19/2006    Years since quitting: 14.9   Smokeless tobacco: Never  Vaping Use   Vaping Use: Never used  Substance and Sexual Activity   Alcohol use: Not Currently    Alcohol/week: 6.0 standard drinks    Types: 6 Shots of liquor per week   Drug use: No   Sexual activity: Not on file  Other Topics Concern   Not on file  Social History Narrative   Not on file   Social Determinants of Health   Financial Resource Strain: Not on file  Food Insecurity: No Food Insecurity   Worried About Running Out of Food in the Last Year: Never true   [place] in the Last Year: Never true  Transportation Needs: No Transportation Needs   Lack of Transportation (Medical): No   Lack of Transportation (Non-Medical): No  Physical Activity: Not on file  Stress: No Stress Concern Present   Feeling of Stress : Only a little  Social Connections: Not on file  Intimate Partner Violence: Not At Risk   Fear of Current or Ex-Partner: No   Emotionally Abused: No   Physically Abused: No   Sexually Abused: No   Family History  Problem Relation Age of Onset   Heart attack Brother 41   ROS: All systems reviewed and negative except as per HPI.   Current Outpatient Medications  Medication Sig Dispense Refill   albuterol (VENTOLIN HFA) 108 (90 Base) MCG/ACT inhaler Inhale 1 puff into the lungs every 4 (four) hours as needed for shortness of breath.     allopurinol  (ZYLOPRIM) 100 MG tablet Take 100 mg by mouth every morning.     apixaban (ELIQUIS) 5 MG TABS tablet Take 1 tablet (5 mg total) by mouth 2 (two) times daily. 60 tablet    Azelastine HCl 0.15 % SOLN Place 2 sprays into the nose daily as needed (allergies).     Cinnamon 500 MG capsule Take 500 mg by mouth every morning.     dapagliflozin propanediol (FARXIGA) 10 MG TABS tablet Take 1 tablet (10 mg total) by mouth daily. 30 tablet 6   diclofenac Sodium (VOLTAREN) 1 % GEL Apply 1 application topically daily as needed (pain).     feeding supplement (ENSURE ENLIVE / ENSURE PLUS) LIQD Take 237 mLs by mouth 3 (three) times daily between meals. (Patient taking differently: Take 237 mLs by mouth daily as needed.) 237 mL 12   finasteride (PROSCAR) 5 MG tablet  Take 5 mg by mouth at bedtime.     fluconazole (DIFLUCAN) 200 MG tablet Take 200 mg by mouth daily. Patient takes 1 tablet every day for 14 days.     gabapentin (NEURONTIN) 100 MG capsule Take 300 mg by mouth 3 (three) times daily.     ipratropium (ATROVENT) 0.03 % nasal spray Place 1 spray into both nostrils daily as needed (allergies).     magnesium oxide (MAG-OX) 400 (240 Mg) MG tablet TAKE ONE CAPSULE BY MOUTH TWICE DAILY FOR 2 WEEKS, THEN DECREASE TO ONCE DAILY THEREAFTER. (Patient taking differently: Take 400 mg by mouth daily.) 90 tablet 1   midodrine (PROAMATINE) 5 MG tablet Take 3 tablets (15 mg total) by mouth 3 (three) times daily with meals. 270 tablet 3   Multiple Vitamin (MULTIVITAMIN WITH MINERALS) TABS tablet Take 1 tablet by mouth daily. 30 tablet 0   ondansetron (ZOFRAN) 4 MG tablet Take 1 tablet (4 mg total) by mouth every 6 (six) hours as needed for nausea. 20 tablet 0   OXYGEN Inhale 2 L into the lungs as needed for shortness of breath.     pantoprazole (PROTONIX) 40 MG tablet Take 40 mg by mouth every morning.     Potassium Chloride ER 20 MEQ TBCR Take 20 mEq by mouth daily.     Riociguat (ADEMPAS) 1.5 MG TABS Take 1.5 mg by mouth  in the morning, at noon, and at bedtime. 90 tablet 0   tamsulosin (FLOMAX) 0.4 MG CAPS capsule Take 0.4 mg by mouth daily.     Tiotropium Bromide-Olodaterol 2.5-2.5 MCG/ACT AERS Inhale 2 puffs into the lungs daily. 4 g 5   torsemide 60 MG TABS Take 60 mg by mouth daily. 30 tablet 0   No current facility-administered medications for this encounter.   Wt Readings from Last 3 Encounters:  07/17/21 87.7 kg  07/06/21 88.9 kg  06/14/21 96.5 kg   BP (!) 89/62   Pulse 91   Wt 87.7 kg   SpO2 94%   BMI 24.17 kg/m  ReDs 27% General:  NAD. No resp difficulty, walked into clinic with RW on oxygen. HEENT: Normal Neck: Supple. No JVD. Carotids 2+ bilat; no bruits. No lymphadenopathy or thryomegaly appreciated. Cor: PMI nondisplaced. Regular rate & rhythm. No rubs, gallops or murmurs. Lungs: Clear Abdomen: Soft, nontender, nondistended. No hepatosplenomegaly. No bruits or masses. Good bowel sounds. Extremities: No cyanosis, clubbing, rash, edema Neuro: Alert & oriented x 3, cranial nerves grossly intact. Moves all 4 extremities w/o difficulty. Affect pleasant.  Assessment/Plan: 1. Chronic HFpEF/RV failure: Echo (2/22) with EF 55-60%, IV septum flattened, severe RV enlargement, severely decreased RV function, PASP 57 mmHg.  He has severe RV failure.  On exam today and by REDS clip, he is not significantly volume overloaded. Weight continues to trend down rapidly.  NYHA class IIIb, functional status confounded  by weakness and continued episodes of what sound like orthostasis.  - Continue torsemide 60 mg daily for now. BMET/BNP today.  - Continue midodrine 15 mg tid. - Needs to wear compression hose every day. 2. Pulmonary HTN: PAH noted on 4/22 RHC with PVR 6.1 WU.  This appears to be multifactorial with OSA, severe emphysema, and a suspected chronic PE involving the right middle lobe (group 3 and group 4 PH). Given the suspected mixed etiology with only 1 area of chronic thromboembolism (right  middle lobe) as well as age, do not think that pulmonary thromboendarterectomy would be indicated.  Rheumatologic serologic workup was negative.  PFTs showed severe obstruction and moderate restriction, suggesting significant COPD.  6 minute walk mildly improved last visit.  - Continue riociguat for CTEPH, 1.5 mg bid.  - Continue 2L home oxygen at rest, he needs to increase to 4L with exertion.  3. Stage III CKD: BMET today.  4. OSA: Untreated.  Has home sleep study, needs to do.  5. Emphysema: Prior smoker.  Emphysema on CT and severe obstruction on PFTs - He uses home oxygen as above.   6. Chronic PE: Continue apixaban. No abnormal bleeding.  - With frequent falls, may need to consider stopping Eliquis. 7. Hiatial hernia w/ esophagitis: per recent EGD. Starting fluconazole today. - Continue current diet and PPI per GI. - Stop Farxiga with yeast. 8. Physical Deconditioning/FTT: Likely multifactorial with severe RV failure and poor appetite with esophagitis.  - Continue Ensure and protein suppl. - We discussed trying Pulm Rehab again to help with balance issues and encourage mobility. He and his family are agreeable to trial of this.  - Discussed fall precautions with patient and family; should he fall and hit his head, needs to go to ED with being on Eliquis. He may soon require facility placement. He needs to avoid stairs in the home.  Followup in 2 months w/ Dr. Aundra Dubin.  Sherman FNP 07/17/2021  Addendum: Patient leaving clinic and had a syncopal episode. Discussed with Dr. Aundra Dubin. Will decrease torsemide to 40 mg daily, stop [place], Place Zio 3 day AT to assess for any ectopy, and add abdominal binder for orthostasis. Likely will not be able to tolerate Pulmonary Rehab at this time.  Follow up with APP in 2 weeks. Will need ReDs, EKG, BMET.  Patient seen with NP, agree with the above note.   Called to see Mr Baumgarten after syncopal event when standing up.  He recovered quickly  to full consciousness.  He has known severe/end stage RV failure on midodrine.    General: NAD Neck: No JVD, no thyromegaly or thyroid nodule.  Lungs: Distant BS CV: Nondisplaced PMI.  Heart regular S1/S2, no S3/S4, no murmur.  No peripheral edema.  No carotid bruit.  Normal pedal pulses.  Abdomen: Soft, nontender, no hepatosplenomegaly, no distention.  Skin: Intact without lesions or rashes.  Neurologic: Alert and oriented x 3.  Psych: Normal affect. Extremities: No clubbing or cyanosis.  HEENT: Normal.   I suspect that Mr Dilworth event was orthostatic in nature.  He has had syncope before thought to be due to orthostasis.  He is not volume overloaded, REDS clip 27%. Unlikely to be arrhythmic.  - Decrease torsemide to 40 mg daily.  - Stop [place].  - He will wear graded compression stockings and an abdominal binder.  - He is already on midodrine 15 mg tid.  - I will have him wear a Zio monitor to assess for any significant arrhythmias (doubt).  - Suspect end-stage RV failure, will need to consider hospice care in future.   Loralie Champagne 07/18/2021

## 2021-07-17 NOTE — Telephone Encounter (Signed)
Called patient to see if he is interested in the Pulmonary Rehab Program. Patient expressed interest. Explained scheduling process and went over insurance process, patient verbalized understanding. Someone from our pulmonary rehab staff will contact pt at a later time

## 2021-07-17 NOTE — Progress Notes (Signed)
ReDS Vest / Clip - 07/17/21 1000       ReDS Vest / Clip   Station Marker C    Ruler Value 29    ReDS Value Range Low volume    ReDS Actual Value 27

## 2021-07-17 NOTE — Telephone Encounter (Signed)
Pulmonary rehab office referral recv'ed, printed and given to RN for review.

## 2021-07-18 ENCOUNTER — Telehealth (HOSPITAL_COMMUNITY): Payer: Self-pay | Admitting: Cardiology

## 2021-07-18 DIAGNOSIS — J449 Chronic obstructive pulmonary disease, unspecified: Secondary | ICD-10-CM | POA: Diagnosis not present

## 2021-07-18 DIAGNOSIS — R55 Syncope and collapse: Secondary | ICD-10-CM | POA: Diagnosis not present

## 2021-07-18 DIAGNOSIS — E119 Type 2 diabetes mellitus without complications: Secondary | ICD-10-CM | POA: Diagnosis not present

## 2021-07-18 DIAGNOSIS — K219 Gastro-esophageal reflux disease without esophagitis: Secondary | ICD-10-CM | POA: Diagnosis not present

## 2021-07-18 DIAGNOSIS — I50812 Chronic right heart failure: Secondary | ICD-10-CM

## 2021-07-18 DIAGNOSIS — I5032 Chronic diastolic (congestive) heart failure: Secondary | ICD-10-CM | POA: Diagnosis not present

## 2021-07-18 NOTE — Telephone Encounter (Signed)
Patient called.  Patient aware and patients wife aware and voiced understanding Repeat labs 12/7

## 2021-07-18 NOTE — Telephone Encounter (Signed)
-----  Message from Rafael Bihari, [place] sent at 07/17/2021 12:22 PM EST ----- Kidney function is elevated and K is low. Continue with medication changes made today (decrease torsemide to 40 daily and stop [place]).   Please take 40 mEq of KCL BID today and tomorrow, then can resume 40 daily.  Needs repeat BMET in 7-10 days

## 2021-07-19 ENCOUNTER — Other Ambulatory Visit: Payer: Self-pay | Admitting: *Deleted

## 2021-07-19 NOTE — Patient Outreach (Addendum)
[facility]) Care Management  07/19/2021  Joshua [name] 1936-01-14 [id number]   [facility] Unsuccessful outreach  Mr Joshua [name] was referred to [facility] on 05/04/21 after a noted red alert flag for EMMI general outreaches on 05/03/21 for possible concerns with scheduling follow up appointments   Insurance Medicare  Outreach attempt to the listed at the preferred outreach number in [facility]  No answer. [facility] RN CM left HIPAA [facility] Portability and Accountability Act) compliant voicemail message along with CM's contact info.   Plan: [facility] RN CM scheduled this patient for another call attempt within 4-7 business days Unsuccessful outreach on 07/19/21   Seleen Walter L. Lavina Hamman, RN, BSN, [place] Coordinator Office number [phone] Mobile number [phone]  Main [facility] number [phone] Fax number [phone]

## 2021-07-25 ENCOUNTER — Ambulatory Visit (HOSPITAL_COMMUNITY)
Admission: RE | Admit: 2021-07-25 | Discharge: 2021-07-25 | Disposition: A | Discharge status: Home / Self Care | Payer: Medicare Other | Source: Ambulatory Visit | Attending: Internal Medicine | Admitting: Internal Medicine

## 2021-07-25 ENCOUNTER — Encounter (HOSPITAL_COMMUNITY): Payer: Medicare Other

## 2021-07-25 ENCOUNTER — Other Ambulatory Visit: Payer: Self-pay

## 2021-07-25 DIAGNOSIS — I50812 Chronic right heart failure: Secondary | ICD-10-CM | POA: Insufficient documentation

## 2021-07-25 LAB — BASIC METABOLIC PANEL
Anion gap: 11 (ref 5–15)
BUN: 32 mg/dL — ABNORMAL HIGH (ref 8–23)
CO2: 31 mmol/L (ref 22–32)
Calcium: 10 mg/dL (ref 8.9–10.3)
Chloride: 95 mmol/L — ABNORMAL LOW (ref 98–111)
Creatinine, Ser: 1.81 mg/dL — ABNORMAL HIGH (ref 0.61–1.24)
GFR, Estimated: 37 mL/min — ABNORMAL LOW (ref >60)
Glucose, Bld: 165 mg/dL — ABNORMAL HIGH (ref 70–99)
Potassium: 4.5 mmol/L (ref 3.5–5.1)
Sodium: 137 mmol/L (ref 135–145)

## 2021-07-26 ENCOUNTER — Other Ambulatory Visit: Payer: Self-pay | Admitting: *Deleted

## 2021-07-26 NOTE — Patient Outreach (Signed)
[place] [facility]) Care Management  07/26/2021  KOHLE WINNER 03-29-1936 [id number]   [facility] second Unsuccessful outreach   Mr MOURAD CWIKLA was referred to [facility] on 05/04/21 after a noted red alert flag for EMMI general outreaches on 05/03/21 for possible concerns with scheduling follow up appointments   Insurance Medicare   Outreach attempt to the listed at the preferred outreach number in [place]  No answer. [facility] RN CM left HIPAA [facility] Portability and Accountability Act) compliant voicemail message along with CM's contact info.   Plan: [facility] RN CM scheduled this patient for another call attempt within 4-7 business days [facility] RN CM scheduled this patient for another call attempt within 4-7 business days Unsuccessful outreach on 07/19/21, 128/22 Unsuccessful outreach letter sent on 07/26/21     Joelene Millin L. Lavina Hamman, RN, BSN, [place] Coordinator Office number [phone] Mobile number [phone]  Main [facility] number [phone] Fax number [phone]

## 2021-07-27 NOTE — Progress Notes (Addendum)
PCP: [name], DO Cardiology: Dr. Burt Knack HF Cardiology: Dr. Aundra Dubin  85 y.o. with history of HFPEF, prior PE, and pulmonary hypertension was referred by Dr. Gasper Sells for evaluation of pulmonary hypertension.  Patient had a PE in 2016.  Joshua [name] is on apixaban. Joshua [name] has a long history of diastolic CHF.  Most recent echo showed a significant component of RV failure with EF 55-60%, IV septum flattened, severe RV enlargement, severely decreased RV function, PASP 57 mmHg.  CTA chest in 4/22 showed no acute PE and mild emphysema, but V/Q scan in 5/22 was suggestive of chronic PE in the right middle lobe.  RHC in 4/22 showed normal filling pressures with moderate PAH. Joshua [name] additionally has chronic hiccups followed by Dr. Benson Norway, now on gabapentin. PFTs in 6/22 showed mixed picture with severe obstruction, moderate restriction, and severely decreased DLCO.   Admitted 8/25-8/30/22 with A/C HF exacerbation. Joshua [name] was aggressively diuresed with lasix/metolazone, had unna boots, farxiga added.  Midodrine was increased for hypotension. Hospitalization c/b AKI on CKD III.  Readmitted 9/1-9/12/22 with shock (mixed septic and cardiogenic), likely 2/2 to aspiration pneumonia and A/C CHF.  Suspect possible gut translocation with ileus/partial SBO; CT abdomen with "nutcracker phenomenon".  Joshua [name] was started on NE + antibiotics. General surgery consulted and felt not to be a surgical candidate. Diuretics initially held due to over-diuresis and shock. Eventually able to wean pressors off, restart PO torsemide and midodrine. Hospitalization c/b transaminitis, delirium, and BPH with urinary retention, requiring foley catheter. Palliative care was consulted for [place] and patient DNR/DNI. PT/OT recommended HH. Joshua [name] was discharged home, weight 220 lbs.  EGD 11/22 showed esophagitis, hiatal hernia and yeast.   Acute visit 06/2921 for weakness/falls. Joshua [name] had a syncopal episode in clinic. Labs showed hypokalemia of 3.3, SCr elevated at 2.13, ReDs  27%. Wilder Glade stopped, torsemide decreased to 40 mg daily and compression hose and abdominal binder ordered. 3-day Zio placed to evaluate for possible arrhythmogenic cause of symptoms.  Today Joshua [name] returns for HF follow up with his wife and daughter. Joshua [name] feels better since medication changes made last visit. No further falls. Joshua [name] remains weak and uses a cane or walker to get around the house. Joshua [name] remains dyspneic with increased activity but able to make his breakfast, fold laundry and use the restroom without significant dyspnea.  Denies CP, dizziness, edema, or PND/Orthopnea. Appetite poor, trying to drink Ensure. No fever or chills. Taking all medications. Wearing his compression hose and abdominal binder. Continues to self cath 2-3 x/day.  ECG (personally reviewed): SR RBBB 90 bpm  Labs (4/22): K 4.8, creatinine 1.67 Labs (6/22): K 3.8, creatinine 1.72, ANA negative, RF 39.6 but CCP negative, SCL-70 negative, BNP 1231, HIV negative Labs (7/22): K 3.8, creatine 1.67 Labs (8/22): K 3.6, creatinine 1.6, hgb 15.3  Labs (9/22): K 5.0, creatinine 1.2, hgb 14.6 Labs (10/22): K 3.4, creatinine 1.37 Labs (10/22): K 3.5, creatinine 1.34  REDS clip 29%  6 minute walk (7/22): 213 m 6 minute walk (10/22): 274 m (oxygen saturation dropped to 70s on 2L [place])  PMH: 1. HFPEF: With prominent RV failure.   - Echo (2/22): EF 55-60%, IV septum flattened, severe RV enlargement, severely decreased RV function, PASP 57 mmHg.  2. Venous thromboembolic disease: PE in 2641.   - Venous dopplers (4/22): No DVT.  - CTA chest (4/22): No PE.  - V/Q scan 5/22 with perfusion defect in the RML consistent with chronic PE.  3. OSA: Does not tolerate CPAP.  4. HTN 5.  COPD: Prior smoker.   - CTA chest in 4/22 showed no PE, mild emphysema.  - PFTs (6/22) with severe obstruction, moderate restriction, severely decreased DLCO 6. Pulmonary hypertension: RHC (4/22) with mean RA 5, PA 65/19 mean 36, mean PCWP 5, CI 2.19, PVR 6.1 WU,  PAPi 9.2.  7. Chronic hiccups 8. BPH: Has to in and out cath at times. 9. Syncope: Zio 2 week mostly SR, BBB/IVCD present, 2 episodes of VT occurred, fastest 10 beats w/ max rate of 160 bpm, 17 SVT runs with fastest interval 9.7 seconds with max rate of 218  10. Chronic hiccups 11. 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  Social History   Socioeconomic History   Marital status: Married    Spouse name: Joann   Number of children: Not on file   Years of education: Not on file   Highest education level: Not on file  Occupational History   Not on file  Tobacco Use   Smoking status: Former    Packs/day: 1.00    Years: 50.00    Pack years: 50.00    Types: Cigarettes    Start date: 41    Quit date: 08/19/2006    Years since quitting: 14.9   Smokeless tobacco: Never  Vaping Use   Vaping Use: Never used  Substance and Sexual Activity   Alcohol use: Not Currently    Alcohol/week: 6.0 standard drinks    Types: 6 Shots of liquor per week   Drug use: No   Sexual activity: Not on file  Other Topics Concern   Not on file  Social History Narrative   Not on file   Social Determinants of Health   Financial Resource Strain: Not on file  Food Insecurity: No Food Insecurity   Worried About Running Out of Food in the Last Year: Never true   [place] in the Last Year: Never true  Transportation Needs: No Transportation Needs   Lack of Transportation (Medical): No   Lack of Transportation (Non-Medical): No  Physical Activity: Not on file  Stress: No Stress Concern Present   Feeling of Stress : Only a little  Social Connections: Not on file  Intimate Partner Violence: Not At Risk   Fear of Current or Ex-Partner: No   Emotionally Abused: No   Physically Abused: No   Sexually Abused: No   Family History  Problem Relation Age of Onset   Heart attack Brother 32   ROS: All systems reviewed and negative  except as per HPI.   Current Outpatient Medications  Medication Sig Dispense Refill   albuterol (VENTOLIN HFA) 108 (90 Base) MCG/ACT inhaler Inhale 1 puff into the lungs every 4 (four) hours as needed for shortness of breath.     allopurinol (ZYLOPRIM) 100 MG tablet Take 100 mg by mouth every morning.     apixaban (ELIQUIS) 5 MG TABS tablet Take 1 tablet (5 mg total) by mouth 2 (two) times daily. 60 tablet    Azelastine HCl 0.15 % SOLN Place 2 sprays into the nose daily as needed (allergies).     Cinnamon 500 MG capsule Take 500 mg by mouth every morning.     feeding supplement (ENSURE ENLIVE / ENSURE PLUS) LIQD Take 237 mLs by mouth 3 (three) times daily between meals. (Patient taking differently: Take 237 mLs by mouth daily as needed.) 237 mL 12   finasteride (PROSCAR) 5 MG tablet Take 5 mg  by mouth at bedtime.     fluconazole (DIFLUCAN) 200 MG tablet Take 200 mg by mouth daily. Patient takes 1 tablet every day for 14 days.     gabapentin (NEURONTIN) 100 MG capsule Take 300 mg by mouth 3 (three) times daily.     ipratropium (ATROVENT) 0.03 % nasal spray Place 1 spray into both nostrils daily as needed (allergies).     magnesium oxide (MAG-OX) 400 (240 Mg) MG tablet TAKE ONE CAPSULE BY MOUTH TWICE DAILY FOR 2 WEEKS, THEN DECREASE TO ONCE DAILY THEREAFTER. (Patient taking differently: Take 400 mg by mouth daily.) 90 tablet 1   midodrine (PROAMATINE) 5 MG tablet Take 3 tablets (15 mg total) by mouth 3 (three) times daily with meals. 270 tablet 3   Multiple Vitamin (MULTIVITAMIN WITH MINERALS) TABS tablet Take 1 tablet by mouth daily. 30 tablet 0   ondansetron (ZOFRAN) 4 MG tablet Take 1 tablet (4 mg total) by mouth every 6 (six) hours as needed for nausea. 20 tablet 0   OXYGEN Inhale 2 L into the lungs as needed for shortness of breath.     pantoprazole (PROTONIX) 40 MG tablet Take 40 mg by mouth every morning.     Potassium Chloride ER 20 MEQ TBCR Take 20 mEq by mouth daily.     Riociguat  (ADEMPAS) 1.5 MG TABS Take 1.5 mg by mouth in the morning, at noon, and at bedtime. 90 tablet 0   tamsulosin (FLOMAX) 0.4 MG CAPS capsule Take 0.4 mg by mouth daily.     torsemide (DEMADEX) 20 MG tablet Take 2 tablets (40 mg total) by mouth daily. 60 tablet 3   Tiotropium Bromide-Olodaterol 2.5-2.5 MCG/ACT AERS Inhale 2 puffs into the lungs daily. (Patient not taking: Reported on 07/31/2021) 4 g 5   No current facility-administered medications for this encounter.   Wt Readings from Last 3 Encounters:  07/31/21 89.4 kg (197 lb 3.2 oz)  07/17/21 87.7 kg (193 lb 6.4 oz)  07/06/21 88.9 kg (196 lb)   BP (!) 88/64   Pulse 91   Wt 89.4 kg (197 lb 3.2 oz)   SpO2 96% Comment: on 2L  BMI 24.65 kg/m  ReDs 29% General:  NAD. No resp difficulty, walked into clinic with RW on oxygen. HEENT: Normal Neck: Supple. No JVD. Carotids 2+ bilat; no bruits. No lymphadenopathy or thryomegaly appreciated. Cor: PMI nondisplaced. Regular rate & rhythm. No rubs, gallops or murmurs. Lungs: Clear Abdomen: Soft, nontender, nondistended. No hepatosplenomegaly. No bruits or masses. Good bowel sounds. Extremities: No cyanosis, clubbing, rash, edema Neuro: Alert & oriented x 3, cranial nerves grossly intact. Moves all 4 extremities w/o difficulty. Affect pleasant.  Assessment/Plan: 1. Chronic HFpEF/RV failure: Echo (2/22) with EF 55-60%, IV septum flattened, severe RV enlargement, severely decreased RV function, PASP 57 mmHg.  Joshua [name] has severe RV failure.  On exam today and by REDS clip 29%, Joshua [name] is not volume overloaded. Weight continues to trend down.  NYHA class IIIb, functional status confounded  by weakness and previous episodes of what sound like orthostasis, however no further episodes in past 2 weeks. - Continue torsemide 40 mg daily for now. Recent SCr 1.81, K 4.5 - Continue midodrine 15 mg tid. Would avoid fludrocortisone. - Needs to wear compression hose + abdominal binder every day. 2. Pulmonary HTN: PAH noted  on 4/22 RHC with PVR 6.1 WU.  This appears to be multifactorial with OSA, severe emphysema, and a suspected chronic PE involving the right middle lobe (group 3 and  group 4 PH). Given the suspected mixed etiology with only 1 area of chronic thromboembolism (right middle lobe) as well as age, do not think that pulmonary thromboendarterectomy would be indicated.  Rheumatologic serologic workup was negative.  PFTs showed severe obstruction and moderate restriction, suggesting significant COPD.  6 minute walk mildly improved last visit.  - Continue riociguat for CTEPH, 1.5 mg bid.  - Continue 2L home oxygen at rest, Joshua [name] needs to increase to 4L with exertion.  3. Stage III CKD: Recent SCr 1.81, K 4.5 4. OSA: Untreated.  Has home sleep study, needs to do.  5. Emphysema: Prior smoker.  Emphysema on CT and severe obstruction on PFTs - Joshua [name] uses home oxygen as above.   6. Chronic PE: Continue apixaban. No abnormal bleeding.  - With frequent falls, may need to consider stopping Eliquis. 7. Hiatial hernia w/ esophagitis: per recent EGD. Completed fluconazole. - Continue current diet and PPI per GI. - Off Farxiga with yeast. 8. Physical Deconditioning/FTT: Likely multifactorial with severe RV failure and poor appetite with esophagitis.  - Continue Ensure and protein suppl. - Discussed fall precautions with patient and family; should Joshua [name] fall and hit his head, needs to go to ED with being on Eliquis. Joshua [name] may soon require facility placement. Joshua [name] needs to avoid stairs in the home. 9 GOC: Discussed disease trajectory of RV failure and how to safely optimize his mobility, while acknowledging there will come a time we will need to discuss ways in which to keep him comfortable. Patient and family not ready to discuss goals of care further today.  Followup in 2 months w/ Dr. Aundra Dubin.  [place] FNP 07/31/2021

## 2021-07-30 DIAGNOSIS — Z961 Presence of intraocular lens: Secondary | ICD-10-CM | POA: Diagnosis not present

## 2021-07-30 DIAGNOSIS — H04123 Dry eye syndrome of bilateral lacrimal glands: Secondary | ICD-10-CM | POA: Diagnosis not present

## 2021-07-31 ENCOUNTER — Ambulatory Visit (HOSPITAL_COMMUNITY)
Admission: RE | Admit: 2021-07-31 | Discharge: 2021-07-31 | Disposition: A | Discharge status: Home / Self Care | Payer: Medicare Other | Source: Ambulatory Visit | Attending: Family Medicine | Admitting: Family Medicine

## 2021-07-31 ENCOUNTER — Telehealth: Payer: Self-pay

## 2021-07-31 ENCOUNTER — Encounter (HOSPITAL_COMMUNITY): Payer: Self-pay

## 2021-07-31 ENCOUNTER — Other Ambulatory Visit: Payer: Self-pay

## 2021-07-31 VITALS — BP 88/64 | HR 91 | Wt 197.2 lb

## 2021-07-31 DIAGNOSIS — I50812 Chronic right heart failure: Secondary | ICD-10-CM

## 2021-07-31 DIAGNOSIS — J439 Emphysema, unspecified: Secondary | ICD-10-CM | POA: Insufficient documentation

## 2021-07-31 DIAGNOSIS — N183 Chronic kidney disease, stage 3 unspecified: Secondary | ICD-10-CM | POA: Diagnosis not present

## 2021-07-31 DIAGNOSIS — I2724 Chronic thromboembolic pulmonary hypertension: Secondary | ICD-10-CM | POA: Insufficient documentation

## 2021-07-31 DIAGNOSIS — Z66 Do not resuscitate: Secondary | ICD-10-CM | POA: Insufficient documentation

## 2021-07-31 DIAGNOSIS — J449 Chronic obstructive pulmonary disease, unspecified: Secondary | ICD-10-CM

## 2021-07-31 DIAGNOSIS — Z79899 Other long term (current) drug therapy: Secondary | ICD-10-CM | POA: Diagnosis not present

## 2021-07-31 DIAGNOSIS — Z7189 Other specified counseling: Secondary | ICD-10-CM

## 2021-07-31 DIAGNOSIS — K209 Esophagitis, unspecified without bleeding: Secondary | ICD-10-CM | POA: Insufficient documentation

## 2021-07-31 DIAGNOSIS — R627 Adult failure to thrive: Secondary | ICD-10-CM

## 2021-07-31 DIAGNOSIS — I5032 Chronic diastolic (congestive) heart failure: Secondary | ICD-10-CM | POA: Insufficient documentation

## 2021-07-31 DIAGNOSIS — I130 Hypertensive heart and chronic kidney disease with heart failure and stage 1 through stage 4 chronic kidney disease, or unspecified chronic kidney disease: Secondary | ICD-10-CM | POA: Insufficient documentation

## 2021-07-31 DIAGNOSIS — Z9181 History of falling: Secondary | ICD-10-CM | POA: Diagnosis not present

## 2021-07-31 DIAGNOSIS — Z9981 Dependence on supplemental oxygen: Secondary | ICD-10-CM | POA: Diagnosis not present

## 2021-07-31 DIAGNOSIS — I272 Pulmonary hypertension, unspecified: Secondary | ICD-10-CM

## 2021-07-31 DIAGNOSIS — R5381 Other malaise: Secondary | ICD-10-CM | POA: Diagnosis not present

## 2021-07-31 DIAGNOSIS — Z86711 Personal history of pulmonary embolism: Secondary | ICD-10-CM

## 2021-07-31 DIAGNOSIS — K449 Diaphragmatic hernia without obstruction or gangrene: Secondary | ICD-10-CM | POA: Diagnosis not present

## 2021-07-31 DIAGNOSIS — I4510 Unspecified right bundle-branch block: Secondary | ICD-10-CM | POA: Diagnosis not present

## 2021-07-31 DIAGNOSIS — G4733 Obstructive sleep apnea (adult) (pediatric): Secondary | ICD-10-CM

## 2021-07-31 DIAGNOSIS — Z87891 Personal history of nicotine dependence: Secondary | ICD-10-CM | POA: Insufficient documentation

## 2021-07-31 DIAGNOSIS — R55 Syncope and collapse: Secondary | ICD-10-CM

## 2021-07-31 DIAGNOSIS — Z7901 Long term (current) use of anticoagulants: Secondary | ICD-10-CM | POA: Diagnosis not present

## 2021-07-31 NOTE — Progress Notes (Signed)
ReDS Vest / Clip - 07/31/21 1400       ReDS Vest / Clip   Station Marker C    Ruler Value 29    ReDS Value Range Low volume    ReDS Actual Value 29

## 2021-07-31 NOTE — Telephone Encounter (Signed)
No PA required. REF # [id number]  Called and made the patient aware that he may proceed with the [facility] Sleep Study. PIN # provided to the patient. Patient made aware that he will be contacted after the test has been read with the results and any recommendations. Patient verbalized understanding and states that he will take the test by the weekend.

## 2021-07-31 NOTE — Addendum Note (Signed)
Encounter addended by: Rafael Bihari, FNP on: 07/31/2021 5:03 PM  Actions taken: Clinical Note Signed

## 2021-07-31 NOTE — Patient Instructions (Addendum)
Medication Changes:  None  Lab Work:  None   Follow-Up : Keep with Dr. Aundra Dubin  At the [facility], you and your health needs are our priority. We have a designated team specialized in the treatment of Heart Failure. This Care Team includes your primary Heart Failure Specialized Cardiologist (physician), Advanced Practice Providers (APPs- Physician Assistants and Nurse Practitioners), and Pharmacist who all work together to provide you with the care you need, when you need it.   You may see any of the following providers on your designated Care Team at your next follow up:  Dr Glori Bickers Dr Haynes Kerns, NP Lyda Jester, [place], [place] Audry Riles, PharmD.hfg  It was great to see you today! No medication changes are needed at this time.    Please be sure to bring in all your medications bottles to every appointment.   Need to Contact us:  If you have any questions or concerns before your next appointment please send [place] a message through [place] or call our office at [phone].    TO LEAVE A MESSAGE FOR THE NURSE SELECT OPTION 2, PLEASE LEAVE A MESSAGE INCLUDING: YOUR NAME DATE OF BIRTH CALL BACK NUMBER REASON FOR CALL**this is important as we prioritize the call backs  YOU WILL RECEIVE A CALL BACK THE SAME DAY AS LONG AS YOU CALL BEFORE 4:00 PM

## 2021-08-01 ENCOUNTER — Other Ambulatory Visit: Payer: Self-pay | Admitting: *Deleted

## 2021-08-01 NOTE — Patient Outreach (Signed)
[facility]) Care Management  08/01/2021  MCLEAN MOYA June 27, 1936 [id number]     [facility] third Unsuccessful outreach    Mr SEAN MALINOWSKI was referred to [facility] on 05/04/21 after a noted red alert flag for EMMI general outreaches on 05/03/21 for possible concerns with scheduling follow up appointments   Insurance Medicare   Outreach attempt to the listed at the preferred outreach number in [place]  No answer. [facility] RN CM left HIPAA [facility] Portability and Accountability Act) compliant voicemail message along with CMs contact info.    Plan: [facility] RN CM pended this patient for case closure Unsuccessful outreach on 07/19/21, 128/22 08/01/21 Unsuccessful outreach letter sent on 07/26/21     Joelene Millin L. Lavina Hamman, RN, BSN, [place] Coordinator Office number [phone] Main [facility] number [phone] Fax number [phone]

## 2021-08-03 ENCOUNTER — Encounter (INDEPENDENT_AMBULATORY_CARE_PROVIDER_SITE_OTHER): Payer: Medicare Other | Admitting: Cardiology

## 2021-08-03 DIAGNOSIS — G4733 Obstructive sleep apnea (adult) (pediatric): Secondary | ICD-10-CM | POA: Diagnosis not present

## 2021-08-06 DIAGNOSIS — R112 Nausea with vomiting, unspecified: Secondary | ICD-10-CM | POA: Diagnosis not present

## 2021-08-06 DIAGNOSIS — B3781 Candidal esophagitis: Secondary | ICD-10-CM | POA: Diagnosis not present

## 2021-08-06 DIAGNOSIS — R634 Abnormal weight loss: Secondary | ICD-10-CM | POA: Diagnosis not present

## 2021-08-06 DIAGNOSIS — R066 Hiccough: Secondary | ICD-10-CM | POA: Diagnosis not present

## 2021-08-08 NOTE — Telephone Encounter (Signed)
Left detailed message for the pt. Called stating following up to see if he has completed the [place] sleep study. If he has not yet completed sleep study within 2 weeks from the date of the first call on 07/31/21 or if the device has not been returned to the office by the 2 weeks from 07/31/21, will be sent to billing. 2 week date will be 08/14/21.

## 2021-08-16 NOTE — Telephone Encounter (Signed)
Dear Mr. Joshua [name],   We have been unable to reach you by phone.  This letter is to inform you that your [facility] Sleep Study needs to be completed or returned within 2 weeks of today's date 08/16/21. If the sleep study has not been done within this timeframe or the device has not been returned, then this will be handed over to our billing department per the signed waiver agreement.   If you have any questions or concerns, feel free to call us at [phone].    Sincerely,  [facility] Sleep Team

## 2021-08-17 DIAGNOSIS — E119 Type 2 diabetes mellitus without complications: Secondary | ICD-10-CM | POA: Diagnosis not present

## 2021-08-17 DIAGNOSIS — K219 Gastro-esophageal reflux disease without esophagitis: Secondary | ICD-10-CM | POA: Diagnosis not present

## 2021-08-17 DIAGNOSIS — J449 Chronic obstructive pulmonary disease, unspecified: Secondary | ICD-10-CM | POA: Diagnosis not present

## 2021-08-17 DIAGNOSIS — I5032 Chronic diastolic (congestive) heart failure: Secondary | ICD-10-CM | POA: Diagnosis not present

## 2021-08-21 ENCOUNTER — Ambulatory Visit: Payer: Medicare Other

## 2021-08-21 DIAGNOSIS — G4733 Obstructive sleep apnea (adult) (pediatric): Secondary | ICD-10-CM

## 2021-08-21 NOTE — Procedures (Signed)
° °  Sleep Study Report  Patient Information Study Date: 08/03/21 Patient Name: Joshua [name] Patient ID: [id number] Birth Date: [birth year] Age: 86 Gender: Male BMI: 32.5 (W=253 lb, H=6' 2'') Neck Circ.: 58 '' Referring Physician: Rudean Haskell , MD  TEST DESCRIPTION: Home sleep apnea testing was completed using the WatchPat, a Type 1 device, utilizing peripheral arterial tonometry (PAT), chest movement, actigraphy, pulse oximetry, pulse rate, body position and snore. AHI was calculated with apnea and hypopnea using valid sleep time as the denominator. RDI includes apneas, hypopneas, and RERAs. The data acquired and the scoring of sleep and all associated events were performed in accordance with the recommended standards and specifications as outlined in the AASM Manual for the Scoring of Sleep and Associated Events 2.2.0 (2015).  FINDINGS: 1. Moderate Obstructive Sleep Apnea with AHI 27.5/hr. 2. No Central Sleep Apnea with pAHIc 1/hr. 3. Oxygen desaturations as low as 82%. 4. Moderate snoring was present. O2 sats were < 88% for 68.9 min. 5. Total sleep time was 5 hrs and 55 min. 6. 18.3 % of total sleep time was spent in REM sleep. 7. Normal sleep onset latency at 17 min 8. Shortened REM sleep onset latency at 25 min. 9. Total awakenings were 8.  DIAGNOSIS: Moderate Obstructive Sleep Apnea (G47.33)  RECOMMENDATIONS: 1. Clinical correlation of these findings is necessary. The decision to treat obstructive sleep apnea (OSA) is usually based on the presence of apnea symptoms or the presence of associated medical conditions such as Hypertension, Congestive Heart Failure, Atrial Fibrillation or Obesity. The most common symptoms of OSA are snoring, gasping for breath while sleeping, daytime sleepiness and fatigue.  2. Initiating apnea therapy is recommended given the presence of symptoms and/or associated conditions. Recommend proceeding with one of the following:   a.  Auto-CPAP therapy with a pressure range of 5-20cm H2O.   b. An oral appliance (OA) that can be obtained from certain dentists with expertise in sleep medicine. These are primarily of use in non-obese patients with mild and moderate disease.   c. An ENT consultation which may be useful to look for specific causes of obstruction and possible treatment options.   d. If patient is intolerant to PAP therapy, consider referral to ENT for evaluation for hypoglossal nerve stimulator.  3. Close follow-up is necessary to ensure success with CPAP or oral appliance therapy for maximum benefit .  4. A follow-up oximetry study on CPAP is recommended to assess the adequacy of therapy and determine the need for supplemental oxygen or the potential need for Bi-level therapy. An arterial blood gas to determine the adequacy of baseline ventilation and oxygenation should also be considered.  5. Healthy sleep recommendations include: adequate nightly sleep (normal 7-9 hrs/night), avoidance of caffeine after noon and alcohol near bedtime, and maintaining a sleep environment that is cool, dark and quiet.  6. Weight loss for overweight patients is recommended. Even modest amounts of weight loss can significantly improve the severity of sleep apnea.  7. Snoring recommendations include: weight loss where appropriate, side sleeping, and avoidance of alcohol before bed.  8. Operation of motor vehicle should not be performed when sleepy.  Signature: Electronically Signed: 08/21/21 Fransico Him, MD; [facility]; [place], American Board of Sleep Medicine

## 2021-08-22 NOTE — Telephone Encounter (Signed)
Sleep study has been read

## 2021-08-24 ENCOUNTER — Encounter: Payer: Self-pay | Admitting: Pulmonary Disease

## 2021-08-24 ENCOUNTER — Ambulatory Visit (INDEPENDENT_AMBULATORY_CARE_PROVIDER_SITE_OTHER): Payer: Medicare Other | Admitting: Pulmonary Disease

## 2021-08-24 ENCOUNTER — Other Ambulatory Visit: Payer: Self-pay

## 2021-08-24 DIAGNOSIS — J9611 Chronic respiratory failure with hypoxia: Secondary | ICD-10-CM

## 2021-08-24 DIAGNOSIS — R112 Nausea with vomiting, unspecified: Secondary | ICD-10-CM | POA: Diagnosis not present

## 2021-08-24 DIAGNOSIS — I272 Pulmonary hypertension, unspecified: Secondary | ICD-10-CM | POA: Diagnosis not present

## 2021-08-24 DIAGNOSIS — J432 Centrilobular emphysema: Secondary | ICD-10-CM

## 2021-08-24 NOTE — Assessment & Plan Note (Signed)
Being treated as CTEPH - PVR noted to be 6 Wu in April , continue on riociguat. Is also on apixaban, has fall risk but balance is improved over the past few months so continues on anticoagulation

## 2021-08-24 NOTE — Assessment & Plan Note (Signed)
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  He has follow-up with GI and have asked him to reach out to them

## 2021-08-24 NOTE — Progress Notes (Signed)
Subjective:    Patient ID: Joshua [name], male    DOB: 1935-10-06, 86 y.o.   MRN: [id number]  HPI   86 yo former smoker for FU of COPD, chronic PE & pulmonary hypertension ,being treated as CTEPH PMh/o PE postop after a knee replacement in 2016. -hospitalized 08/2017 for 3 days for acute hypoxic respiratory failure, due to diastolic heart failure.    -EGD 06/2021 showed Candida esophagitis, hiatal hernia  - "Nutcracker phenomenon:" abrupt narrowing third portion of duodenum as it passes between abdominal aorta and SMV.   Meds-  tried on Breo, anoro & trelegy  none of these gave any symptomatic relief -prefers Spiriva, stiolto Started on rociguat 01/2021  He was hospitalized 03/2021 for CHF exacerbation and started on Farxiga and midodrine He was again hospitalized 9/1 to 04/30/2021 with persistent nausea and vomiting and hypotension, requiring pressors.  He was seen by palliative care and limited CODE STATUS was issued, restarted on diuretics eventually.  CT abdomen/pelvis showed narrowing of duodenum but upper GI series did not show any stricture, there was an abdominal hernia with nonobstructed bowel    Chief Complaint  Patient presents with   Follow-up    Patient states that he is having trouble with being constipated. Breathing is better since last visit.    45-monthfollow-up visit. He continues to lose weight, has lost from 211 pounds last visit to 179 pounds.  His clothes are loose.  He states that he is constipated [place] seems to work the best.  Spouse wonders if medication is causing this. He continues to have intermittent vomiting regardless of the kind of food he eats.  He has been evaluated by GI and treated for Candida esophagitis after an EGD.  CT scan suggested duodenal narrowing but not noted on EGD  He was placed on midodrine after episode of hypotension requiring hospitalization.  He continues on riociguat and apixaban. He has not had any falls since his last  visit  Breathing is okay, he is compliant with 2 L of oxygen at all times   Significant tests/ events reviewed 6 minute walk (10/22): 274 m (oxygen saturation dropped to 70s on 2L [place])  02/2021 Ambulatory saturation was 93% at rest and desaturated to 87% on walking, recovered with 3 L of oxygen and maintain while ambulating   RHC (4/22) with mean RA 5, PA 65/19 mean 36, mean PCWP 5, CI 2.19, PVR 6.1 WU, PAPi 9.2.   Echo 12/2017 RVSP 39  NPSG 02/2018 >> no OSA, 45 mins desatn, TST 5.5 h   09/15/14 CT chest showed a large bilateral pulmonary embolism with RV dilatation. 08/2014 Venous Doppler showed no DVT    01/29/21- FEV1 51%, no BD response, ratio 61, TLC 77%, DLCO 35%  PFTs  11/2014 - FEV1 59%, no BD response, ratio 70, TLC 68%, DLCO 35%     CT angiogram 08/2017 left upper lobe nodule 9x6 mm. CT 03/2018 which showed resolution of nodules   Past Medical History:  Diagnosis Date   Arthritis    BPH (benign prostatic hyperplasia)    COPD (chronic obstructive pulmonary disease) (HCC)    Folliculitis    posterior scalp per office visit note of Dr KMaudie Mercury12/09/2013     GERD (gastroesophageal reflux disease)    Hypertension    PE (pulmonary thromboembolism) ([facility]    Phlebitis    right arm  at least 20 years ago    Pneumonia    hx of pneumonia as a child  Sleep apnea     Review of Systems   neg for any significant sore throat, dysphagia, itching, sneezing, nasal congestion or excess/ purulent secretions, fever, chills, sweats, unintended wt loss, pleuritic or exertional cp, hempoptysis, orthopnea pnd or change in chronic leg swelling. Also denies presyncope, palpitations, heartburn, abdominal pain, nausea, vomiting, diarrhea or change in bowel or urinary habits, dysuria,hematuria, rash, arthralgias, visual complaints, headache, numbness weakness or ataxia.     Objective:   Physical Exam  Gen. Pleasant, tall, elderly, malnourished, loose clothes in no distress ENT - no thrush, no  pallor/icterus,no post nasal drip, on [place] Neck: No JVD, no thyromegaly, no carotid bruits Lungs: no use of accessory muscles, no dullness to percussion, clear without rales or rhonchi  Cardiovascular: Rhythm regular, heart sounds  normal, no murmurs or gallops, no peripheral edema Musculoskeletal: No deformities, no cyanosis or clubbing        Assessment & Plan:

## 2021-08-24 NOTE — Assessment & Plan Note (Signed)
Based on 6-minute walk test done in October, he needs to increase oxygen especially during ambulation.  At rest 2 L is okay but on ambulation probably needs up to 4 L oxygen.  I advised him accordingly

## 2021-08-24 NOTE — Assessment & Plan Note (Signed)
No interim exacerbations, continue on Stiolto. He would benefit from pulmonary rehab program but too many issues ongoing currently

## 2021-08-24 NOTE — Patient Instructions (Addendum)
X OK to stop midodrine if SBP stays above 100  X Increase oxygen to 3-4 L when walking  Continue on stiolto  Reach out to Dr Benson Norway for vomiting issue

## 2021-08-29 DIAGNOSIS — J3489 Other specified disorders of nose and nasal sinuses: Secondary | ICD-10-CM | POA: Diagnosis not present

## 2021-08-29 DIAGNOSIS — H903 Sensorineural hearing loss, bilateral: Secondary | ICD-10-CM | POA: Diagnosis not present

## 2021-08-29 DIAGNOSIS — H9191 Unspecified hearing loss, right ear: Secondary | ICD-10-CM | POA: Diagnosis not present

## 2021-08-29 DIAGNOSIS — H90A22 Sensorineural hearing loss, unilateral, left ear, with restricted hearing on the contralateral side: Secondary | ICD-10-CM | POA: Diagnosis not present

## 2021-09-11 ENCOUNTER — Other Ambulatory Visit: Payer: Self-pay

## 2021-09-11 ENCOUNTER — Encounter (HOSPITAL_COMMUNITY): Payer: Self-pay | Admitting: Cardiology

## 2021-09-11 ENCOUNTER — Ambulatory Visit (HOSPITAL_COMMUNITY)
Admission: RE | Admit: 2021-09-11 | Discharge: 2021-09-11 | Disposition: A | Discharge status: Home / Self Care | Payer: Medicare Other | Source: Ambulatory Visit | Attending: Cardiology | Admitting: Cardiology

## 2021-09-11 VITALS — BP 90/58 | HR 89 | Wt 192.0 lb

## 2021-09-11 DIAGNOSIS — J439 Emphysema, unspecified: Secondary | ICD-10-CM | POA: Diagnosis not present

## 2021-09-11 DIAGNOSIS — I509 Heart failure, unspecified: Secondary | ICD-10-CM | POA: Diagnosis not present

## 2021-09-11 DIAGNOSIS — I272 Pulmonary hypertension, unspecified: Secondary | ICD-10-CM

## 2021-09-11 DIAGNOSIS — I5032 Chronic diastolic (congestive) heart failure: Secondary | ICD-10-CM | POA: Diagnosis not present

## 2021-09-11 DIAGNOSIS — J9611 Chronic respiratory failure with hypoxia: Secondary | ICD-10-CM | POA: Diagnosis not present

## 2021-09-11 DIAGNOSIS — I50812 Chronic right heart failure: Secondary | ICD-10-CM

## 2021-09-11 DIAGNOSIS — Z79899 Other long term (current) drug therapy: Secondary | ICD-10-CM | POA: Insufficient documentation

## 2021-09-11 DIAGNOSIS — G4733 Obstructive sleep apnea (adult) (pediatric): Secondary | ICD-10-CM | POA: Insufficient documentation

## 2021-09-11 DIAGNOSIS — Z87891 Personal history of nicotine dependence: Secondary | ICD-10-CM | POA: Diagnosis not present

## 2021-09-11 DIAGNOSIS — I2724 Chronic thromboembolic pulmonary hypertension: Secondary | ICD-10-CM | POA: Insufficient documentation

## 2021-09-11 DIAGNOSIS — I2782 Chronic pulmonary embolism: Secondary | ICD-10-CM | POA: Insufficient documentation

## 2021-09-11 DIAGNOSIS — K449 Diaphragmatic hernia without obstruction or gangrene: Secondary | ICD-10-CM | POA: Insufficient documentation

## 2021-09-11 DIAGNOSIS — K209 Esophagitis, unspecified without bleeding: Secondary | ICD-10-CM | POA: Insufficient documentation

## 2021-09-11 DIAGNOSIS — Z7901 Long term (current) use of anticoagulants: Secondary | ICD-10-CM | POA: Insufficient documentation

## 2021-09-11 DIAGNOSIS — N183 Chronic kidney disease, stage 3 unspecified: Secondary | ICD-10-CM | POA: Insufficient documentation

## 2021-09-11 DIAGNOSIS — I130 Hypertensive heart and chronic kidney disease with heart failure and stage 1 through stage 4 chronic kidney disease, or unspecified chronic kidney disease: Secondary | ICD-10-CM | POA: Insufficient documentation

## 2021-09-11 LAB — BASIC METABOLIC PANEL
Anion gap: 7 (ref 5–15)
BUN: 26 mg/dL — ABNORMAL HIGH (ref 8–23)
CO2: 34 mmol/L — ABNORMAL HIGH (ref 22–32)
Calcium: 10.3 mg/dL (ref 8.9–10.3)
Chloride: 96 mmol/L — ABNORMAL LOW (ref 98–111)
Creatinine, Ser: 1.9 mg/dL — ABNORMAL HIGH (ref 0.61–1.24)
GFR, Estimated: 35 mL/min — ABNORMAL LOW (ref >60)
Glucose, Bld: 110 mg/dL — ABNORMAL HIGH (ref 70–99)
Potassium: 4.1 mmol/L (ref 3.5–5.1)
Sodium: 137 mmol/L (ref 135–145)

## 2021-09-11 LAB — BRAIN NATRIURETIC PEPTIDE: B Natriuretic Peptide: 124.9 pg/mL — ABNORMAL HIGH (ref 0.0–100.0)

## 2021-09-11 MED ORDER — ADEMPAS 1.5 MG PO TABS: 2.0000 mg | ORAL_TABLET | Freq: Three times a day (TID) | ORAL | 1 refills | Status: DC

## 2021-09-11 MED ORDER — ADEMPAS 1.5 MG PO TABS: 2.0000 mg | ORAL_TABLET | Freq: Three times a day (TID) | ORAL | 0 refills | Status: DC

## 2021-09-11 NOTE — Patient Instructions (Signed)
INCREASE Adempas to 2 mg Three times a day   Labs done today, your results will be available in MyChart, we will contact you for abnormal readings.  Your physician recommends that you schedule a follow-up appointment in: 3 months with NP/PA clinic  You have been referred to Dr Theodosia Blender office for sleep studies. For CPAP  If you have any questions or concerns before your next appointment please send [place] a message through [place] or call our office at [phone].    TO LEAVE A MESSAGE FOR THE NURSE SELECT OPTION 2, PLEASE LEAVE A MESSAGE INCLUDING: YOUR NAME DATE OF BIRTH CALL BACK NUMBER REASON FOR CALL**this is important as we prioritize the call backs  YOU WILL RECEIVE A CALL BACK THE SAME DAY AS LONG AS YOU CALL BEFORE 4:00 PM  At the [facility], you and your health needs are our priority. As part of our continuing mission to provide you with exceptional heart care, we have created designated Provider Care Teams. These Care Teams include your primary Cardiologist (physician) and Advanced Practice Providers (APPs- Physician Assistants and Nurse Practitioners) who all work together to provide you with the care you need, when you need it.   You may see any of the following providers on your designated Care Team at your next follow up: Dr Glori Bickers Dr Haynes Kerns, NP Lyda Jester, [place] [facility] [place], [place] Audry Riles, PharmD   Please be sure to bring in all your medications bottles to every appointment.   Do the following things EVERYDAY: Weigh yourself in the morning before breakfast. Write it down and keep it in a log. Take your medicines as prescribed Eat low salt foods--Limit salt (sodium) to 2000 mg per day.  Stay as active as you can everyday Limit all fluids for the day to less than 2 liters

## 2021-09-11 NOTE — Progress Notes (Signed)
PCP: [name], DO Cardiology: Dr. Burt Knack HF Cardiology: Dr. Aundra Dubin  86 y.o. with history of HFPEF, prior PE, and pulmonary hypertension was referred by Dr. Gasper Sells for evaluation of pulmonary hypertension.  Patient had a PE in 2016.  He is on apixaban. He has a long history of diastolic CHF.  Most recent echo showed a significant component of RV failure with EF 55-60%, IV septum flattened, severe RV enlargement, severely decreased RV function, PASP 57 mmHg.  CTA chest in 4/22 showed no acute PE and mild emphysema, but V/Q scan in 5/22 was suggestive of chronic PE in the right middle lobe.  RHC in 4/22 showed normal filling pressures with moderate PAH. He additionally has chronic hiccups followed by Dr. Benson Norway, now on gabapentin. PFTs in 6/22 showed mixed picture with severe obstruction, moderate restriction, and severely decreased DLCO.   Admitted 8/25-8/30/22 with A/C HF exacerbation. He was aggressively diuresed with lasix/metolazone, had unna boots, farxiga added.  Midodrine was increased for hypotension. Hospitalization c/b AKI on CKD III.  Readmitted 9/1-9/12/22 with shock (mixed septic and cardiogenic), likely 2/2 to aspiration pneumonia and A/C CHF.  Suspect possible gut translocation with ileus/partial SBO; CT abdomen with "nutcracker phenomenon".  He was started on NE + antibiotics. General surgery consulted and felt not to be a surgical candidate. Diuretics initially held due to over-diuresis and shock. Eventually able to wean pressors off, restart PO torsemide and midodrine. Hospitalization c/b transaminitis, delirium, and BPH with urinary retention, requiring foley catheter. Palliative care was consulted for [place] and patient DNR/DNI. PT/OT recommended HH. He was discharged home, weight 220 lbs.  EGD 11/22 showed esophagitis, hiatal hernia and yeast.   Acute visit 07/17/21 for weakness/falls. He had a syncopal episode in clinic. Labs showed hypokalemia of 3.3, SCr elevated at 2.13,  ReDs 27%. Wilder Glade stopped, torsemide decreased to 40 mg daily and compression hose and abdominal binder ordered. 3-day Zio placed to evaluate for possible arrhythmogenic cause of symptoms.  Zio monitor showed 19 second run of VT.   He returns for followup of CHF and pulmonary hypertension.  Weight down 5 lbs. He is using compression stockings and abdominal binder.  No recent falls/syncope, rare lightheadedness.  Rare atypical chest pain. Mild fatigue walking up stairs.  No dyspnea walking on flat ground.  He uses 2 L home oxygen.   Labs (4/22): K 4.8, creatinine 1.67 Labs (6/22): K 3.8, creatinine 1.72, ANA negative, RF 39.6 but CCP negative, SCL-70 negative, BNP 1231, HIV negative Labs (7/22): K 3.8, creatine 1.67 Labs (8/22): K 3.6, creatinine 1.6, hgb 15.3  Labs (9/22): K 5.0, creatinine 1.2, hgb 14.6 Labs (10/22): K 3.4, creatinine 1.37 Labs (10/22): K 3.5, creatinine 1.34 Labs (12/22): K 4.5, creatinine 1.81  6 minute walk (7/22): 213 m 6 minute walk (10/22): 274 m (oxygen saturation dropped to 70s on 2L [place])  PMH: 1. HFPEF: With prominent RV failure.   - Echo (2/22): EF 55-60%, IV septum flattened, severe RV enlargement, severely decreased RV function, PASP 57 mmHg.  2. Venous thromboembolic disease: PE in 3536.   - Venous dopplers (4/22): No DVT.  - CTA chest (4/22): No PE.  - V/Q scan 5/22 with perfusion defect in the RML consistent with chronic PE.  3. OSA: Waiting on CPAP.  4. HTN 5. COPD: Prior smoker.   - CTA chest in 4/22 showed no PE, mild emphysema.  - PFTs (6/22) with severe obstruction, moderate restriction, severely decreased DLCO 6. Pulmonary hypertension: RHC (4/22) with mean RA 5,  PA 65/19 mean 36, mean PCWP 5, CI 2.19, PVR 6.1 WU, PAPi 9.2.  7. Chronic hiccups 8. BPH: Has to in and out cath at times. 9. Syncope: Zio 12/22 with 19 second VT run.  10. Chronic hiccups 11. "Nutcracker phenomenon:" abrupt narrowing third portion of duodenum as it passes between  abdominal aorta and SMV.  Social History   Socioeconomic History   Marital status: Married    Spouse name: Joann   Number of children: Not on file   Years of education: Not on file   Highest education level: Not on file  Occupational History   Not on file  Tobacco Use   Smoking status: Former    Packs/day: 1.00    Years: 50.00    Pack years: 50.00    Types: Cigarettes    Start date: 25    Quit date: 08/19/2006    Years since quitting: 15.0   Smokeless tobacco: Never  Vaping Use   Vaping Use: Never used  Substance and Sexual Activity   Alcohol use: Not Currently    Alcohol/week: 6.0 standard drinks    Types: 6 Shots of liquor per week   Drug use: No   Sexual activity: Not on file  Other Topics Concern   Not on file  Social History Narrative   Not on file   Social Determinants of Health   Financial Resource Strain: Not on file  Food Insecurity: No Food Insecurity   Worried About Running Out of Food in the Last Year: Never true   [place] in the Last Year: Never true  Transportation Needs: No Transportation Needs   Lack of Transportation (Medical): No   Lack of Transportation (Non-Medical): No  Physical Activity: Not on file  Stress: No Stress Concern Present   Feeling of Stress : Only a little  Social Connections: Not on file  Intimate Partner Violence: Not At Risk   Fear of Current or Ex-Partner: No   Emotionally Abused: No   Physically Abused: No   Sexually Abused: No   Family History  Problem Relation Age of Onset   Heart attack Brother 46   ROS: All systems reviewed and negative except as per HPI.   Current Outpatient Medications  Medication Sig Dispense Refill   albuterol (VENTOLIN HFA) 108 (90 Base) MCG/ACT inhaler Inhale 1 puff into the lungs every 4 (four) hours as needed for shortness of breath.     allopurinol (ZYLOPRIM) 100 MG tablet Take 100 mg by mouth every morning.     apixaban (ELIQUIS) 5 MG TABS tablet Take 1 tablet (5 mg total) by  mouth 2 (two) times daily. 60 tablet    Azelastine HCl 0.15 % SOLN Place 2 sprays into the nose daily as needed (allergies).     Cinnamon 500 MG capsule Take 500 mg by mouth every morning.     Ensure (ENSURE) Take 237 mLs by mouth daily.     finasteride (PROSCAR) 5 MG tablet Take 5 mg by mouth at bedtime.     gabapentin (NEURONTIN) 100 MG capsule Take 300 mg by mouth 3 (three) times daily.     ipratropium (ATROVENT) 0.03 % nasal spray Place 1 spray into both nostrils daily as needed (allergies).     magnesium oxide (MAG-OX) 400 MG tablet Take 400 mg by mouth.     metoCLOPramide (REGLAN) 5 MG tablet Take 5 mg by mouth 3 (three) times daily before meals.     midodrine (PROAMATINE) 5 MG tablet  Take 3 tablets (15 mg total) by mouth 3 (three) times daily with meals. 270 tablet 3   Multiple Vitamin (MULTIVITAMIN WITH MINERALS) TABS tablet Take 1 tablet by mouth daily. 30 tablet 0   ondansetron (ZOFRAN) 4 MG tablet Take 1 tablet (4 mg total) by mouth every 6 (six) hours as needed for nausea. 20 tablet 0   OXYGEN Inhale 2 L into the lungs as needed for shortness of breath.     pantoprazole (PROTONIX) 40 MG tablet Take 40 mg by mouth every morning.     Potassium Chloride ER 20 MEQ TBCR Take 20 mEq by mouth daily.     tamsulosin (FLOMAX) 0.4 MG CAPS capsule Take 0.4 mg by mouth daily.     Tiotropium Bromide-Olodaterol 2.5-2.5 MCG/ACT AERS Inhale 2 puffs into the lungs daily. 4 g 5   torsemide (DEMADEX) 20 MG tablet Take 2 tablets (40 mg total) by mouth daily. 60 tablet 3   Riociguat (ADEMPAS) 1.5 MG TABS Take 2 mg by mouth in the morning, at noon, and at bedtime. 90 tablet 1   No current facility-administered medications for this encounter.   Wt Readings from Last 3 Encounters:  09/11/21 87.1 kg (192 lb)  08/24/21 81.6 kg (179 lb 12.8 oz)  07/31/21 89.4 kg (197 lb 3.2 oz)   BP (!) 90/58    Pulse 89    Wt 87.1 kg (192 lb)    SpO2 100% Comment: 2l N/C   BMI 24.00 kg/m  General: NAD Neck: No JVD, no  thyromegaly or thyroid nodule.  Lungs: Clear to auscultation bilaterally with normal respiratory effort. CV: Nondisplaced PMI.  Heart regular S1/S2, no S3/S4, no murmur.  No peripheral edema.  No carotid bruit.  Normal pedal pulses.  Abdomen: Soft, nontender, no hepatosplenomegaly, no distention.  Skin: Intact without lesions or rashes.  Neurologic: Alert and oriented x 3.  Psych: Normal affect. Extremities: No clubbing or cyanosis.  HEENT: Normal.   Assessment/Plan: 1. Chronic HFpEF/RV failure: Echo (2/22) with EF 55-60%, IV septum flattened, severe RV enlargement, severely decreased RV function, PASP 57 mmHg.  He has severe RV failure.  He is not volume overloaded. Weight continues to trend down.  NYHA class III. - Continue torsemide 40 mg daily, BMET today.  - Continue midodrine 15 mg tid. Would avoid fludrocortisone. - Needs to wear compression hose + abdominal binder every day. 2. Pulmonary HTN: PAH noted on 4/22 RHC with PVR 6.1 WU.  This appears to be multifactorial with OSA, severe emphysema, and a suspected chronic PE involving the right middle lobe (group 3 and group 4 PH). Given the suspected mixed etiology with only 1 area of chronic thromboembolism (right middle lobe) as well as age, do not think that pulmonary thromboendarterectomy would be indicated.  Rheumatologic serologic workup was negative.  PFTs showed severe obstruction and moderate restriction, suggesting significant COPD.  - Continue riociguat for CTEPH, can increase to 2 mg tid.  Can go up to 2.5 tid if he tolerates this.  - Continue 2L home oxygen at rest, he needs to increase to 4L with exertion.  3. Stage III CKD: BMET today.  4. OSA: Moderate OSA on sleep study, waiting for CPAP.  5. Emphysema: Prior smoker.  Emphysema on CT and severe obstruction on PFTs - He uses home oxygen as above.   6. Chronic PE: Diagnosed by V/Q scan.  No abnormal bleeding.  - Continue Eliquis. 7. Hiatial hernia w/ esophagitis:  -  Continue PPI. - Off Wilder Glade  with yeast.  Followup 3 months.   Loralie Champagne 09/11/2021

## 2021-09-12 ENCOUNTER — Telehealth: Payer: Self-pay | Admitting: *Deleted

## 2021-09-12 DIAGNOSIS — G4733 Obstructive sleep apnea (adult) (pediatric): Secondary | ICD-10-CM

## 2021-09-12 NOTE — Telephone Encounter (Signed)
Per dr Radford Pax, Please let patient know that they have sleep apnea.  Recommend therapeutic CPAP titration for treatment of patient's sleep disordered breathing.  If unable to perform an in lab titration then initiate ResMed auto CPAP from 4 to 15cm H2O with heated humidity and mask of choice and overnight pulse ox on CPAP.

## 2021-09-12 NOTE — Progress Notes (Signed)
Spoke with patient and informed him of his sleep study results per Dr Radford Pax. He does not wish to proceed with any further testing at this time and will let us know if he changes his mind.

## 2021-09-13 ENCOUNTER — Other Ambulatory Visit (HOSPITAL_COMMUNITY): Payer: Self-pay | Admitting: Cardiology

## 2021-09-13 DIAGNOSIS — I272 Pulmonary hypertension, unspecified: Secondary | ICD-10-CM

## 2021-09-18 DIAGNOSIS — J449 Chronic obstructive pulmonary disease, unspecified: Secondary | ICD-10-CM | POA: Diagnosis not present

## 2021-09-18 DIAGNOSIS — I5032 Chronic diastolic (congestive) heart failure: Secondary | ICD-10-CM | POA: Diagnosis not present

## 2021-09-18 DIAGNOSIS — E119 Type 2 diabetes mellitus without complications: Secondary | ICD-10-CM | POA: Diagnosis not present

## 2021-09-18 DIAGNOSIS — K219 Gastro-esophageal reflux disease without esophagitis: Secondary | ICD-10-CM | POA: Diagnosis not present

## 2021-09-24 ENCOUNTER — Telehealth (HOSPITAL_COMMUNITY): Payer: Self-pay | Admitting: *Deleted

## 2021-09-24 ENCOUNTER — Other Ambulatory Visit (HOSPITAL_COMMUNITY): Payer: Self-pay | Admitting: Cardiology

## 2021-09-24 NOTE — Telephone Encounter (Signed)
Received a VM from [place] with pts chronic care management team stating pt had some recent low blood pressure reading and she requested a return call at [phone]. I called her back no answer and left vm for return call.

## 2021-09-25 NOTE — Telephone Encounter (Addendum)
Spoke w/Alicia, RN she reports pt's BP has been running low with SBP in the 80s. Today was 81/55, 2/3= 78/54, 2/1= 81/52, 1/29= 83/56, She wanted Dr Aundra Dubin to be aware incase we need to adjust medications, I asked if pt was symptomatic and she stated she hadn't spoken w/pt so she is unsure, BP readings sent to them electronically through BP cuff they provided to pt. Advised we will contact pt and then will discuss w/Dr Aundra Dubin.  I called and spoke w/pt, he states he has been feeling fine, denies dizziness, lightheadedness, increased fatigue. He states BP is only low on the BP device pcp office gave him and this is the 2nd device they have given him as the 1st one didn't work right either. Pt states he uses his wrist BP cuff daily and readings are in 90-100s (this is pt's usual range). Advised pt to continue using his BP cuff and if it's running in 80s or he is feeling symptomatic to call us, pt agreeable.  Attempted to call Elmo Putt, RN back with info but their office is closed.

## 2021-09-25 NOTE — Telephone Encounter (Signed)
Elmo Putt, RN left another VM today requesting call back regarding BP, attempted to return call and Left message to call back

## 2021-09-30 ENCOUNTER — Other Ambulatory Visit (HOSPITAL_COMMUNITY): Payer: Self-pay | Admitting: Cardiology

## 2021-10-04 ENCOUNTER — Emergency Department (HOSPITAL_COMMUNITY): Payer: Medicare Other

## 2021-10-04 ENCOUNTER — Inpatient Hospital Stay (HOSPITAL_COMMUNITY)
Admission: EM | Admit: 2021-10-04 | Discharge: 2021-10-07 | DRG: 871 | Disposition: A | Discharge status: Home Health | Payer: Medicare Other | Attending: Internal Medicine | Admitting: Internal Medicine

## 2021-10-04 ENCOUNTER — Encounter (HOSPITAL_COMMUNITY): Payer: Self-pay | Admitting: Emergency Medicine

## 2021-10-04 ENCOUNTER — Other Ambulatory Visit: Payer: Self-pay

## 2021-10-04 DIAGNOSIS — Z79899 Other long term (current) drug therapy: Secondary | ICD-10-CM

## 2021-10-04 DIAGNOSIS — N179 Acute kidney failure, unspecified: Secondary | ICD-10-CM | POA: Diagnosis present

## 2021-10-04 DIAGNOSIS — I9589 Other hypotension: Secondary | ICD-10-CM | POA: Diagnosis present

## 2021-10-04 DIAGNOSIS — N182 Chronic kidney disease, stage 2 (mild): Secondary | ICD-10-CM | POA: Insufficient documentation

## 2021-10-04 DIAGNOSIS — K2100 Gastro-esophageal reflux disease with esophagitis, without bleeding: Secondary | ICD-10-CM | POA: Diagnosis present

## 2021-10-04 DIAGNOSIS — M109 Gout, unspecified: Secondary | ICD-10-CM | POA: Diagnosis present

## 2021-10-04 DIAGNOSIS — I2724 Chronic thromboembolic pulmonary hypertension: Secondary | ICD-10-CM | POA: Diagnosis present

## 2021-10-04 DIAGNOSIS — I2782 Chronic pulmonary embolism: Secondary | ICD-10-CM | POA: Diagnosis present

## 2021-10-04 DIAGNOSIS — R918 Other nonspecific abnormal finding of lung field: Secondary | ICD-10-CM | POA: Diagnosis not present

## 2021-10-04 DIAGNOSIS — Z7189 Other specified counseling: Secondary | ICD-10-CM | POA: Diagnosis not present

## 2021-10-04 DIAGNOSIS — M1990 Unspecified osteoarthritis, unspecified site: Secondary | ICD-10-CM

## 2021-10-04 DIAGNOSIS — I5082 Biventricular heart failure: Secondary | ICD-10-CM | POA: Diagnosis present

## 2021-10-04 DIAGNOSIS — I130 Hypertensive heart and chronic kidney disease with heart failure and stage 1 through stage 4 chronic kidney disease, or unspecified chronic kidney disease: Secondary | ICD-10-CM | POA: Diagnosis not present

## 2021-10-04 DIAGNOSIS — I272 Pulmonary hypertension, unspecified: Secondary | ICD-10-CM | POA: Diagnosis not present

## 2021-10-04 DIAGNOSIS — Z20822 Contact with and (suspected) exposure to covid-19: Secondary | ICD-10-CM | POA: Diagnosis not present

## 2021-10-04 DIAGNOSIS — Z96652 Presence of left artificial knee joint: Secondary | ICD-10-CM | POA: Diagnosis present

## 2021-10-04 DIAGNOSIS — E860 Dehydration: Secondary | ICD-10-CM | POA: Diagnosis present

## 2021-10-04 DIAGNOSIS — A419 Sepsis, unspecified organism: Secondary | ICD-10-CM | POA: Diagnosis not present

## 2021-10-04 DIAGNOSIS — I5032 Chronic diastolic (congestive) heart failure: Secondary | ICD-10-CM | POA: Diagnosis not present

## 2021-10-04 DIAGNOSIS — N1831 Chronic kidney disease, stage 3a: Secondary | ICD-10-CM

## 2021-10-04 DIAGNOSIS — N400 Enlarged prostate without lower urinary tract symptoms: Secondary | ICD-10-CM | POA: Diagnosis present

## 2021-10-04 DIAGNOSIS — J9621 Acute and chronic respiratory failure with hypoxia: Secondary | ICD-10-CM | POA: Diagnosis present

## 2021-10-04 DIAGNOSIS — N401 Enlarged prostate with lower urinary tract symptoms: Secondary | ICD-10-CM | POA: Diagnosis present

## 2021-10-04 DIAGNOSIS — Z515 Encounter for palliative care: Secondary | ICD-10-CM | POA: Diagnosis not present

## 2021-10-04 DIAGNOSIS — N138 Other obstructive and reflux uropathy: Secondary | ICD-10-CM | POA: Diagnosis not present

## 2021-10-04 DIAGNOSIS — Z7901 Long term (current) use of anticoagulants: Secondary | ICD-10-CM

## 2021-10-04 DIAGNOSIS — R0689 Other abnormalities of breathing: Secondary | ICD-10-CM | POA: Diagnosis not present

## 2021-10-04 DIAGNOSIS — J189 Pneumonia, unspecified organism: Secondary | ICD-10-CM | POA: Diagnosis present

## 2021-10-04 DIAGNOSIS — Z882 Allergy status to sulfonamides status: Secondary | ICD-10-CM | POA: Diagnosis not present

## 2021-10-04 DIAGNOSIS — N1832 Chronic kidney disease, stage 3b: Secondary | ICD-10-CM | POA: Diagnosis not present

## 2021-10-04 DIAGNOSIS — R112 Nausea with vomiting, unspecified: Secondary | ICD-10-CM | POA: Diagnosis not present

## 2021-10-04 DIAGNOSIS — R0600 Dyspnea, unspecified: Secondary | ICD-10-CM | POA: Diagnosis not present

## 2021-10-04 DIAGNOSIS — I50812 Chronic right heart failure: Secondary | ICD-10-CM | POA: Diagnosis present

## 2021-10-04 DIAGNOSIS — J9610 Chronic respiratory failure, unspecified whether with hypoxia or hypercapnia: Secondary | ICD-10-CM | POA: Diagnosis present

## 2021-10-04 DIAGNOSIS — J9611 Chronic respiratory failure with hypoxia: Secondary | ICD-10-CM | POA: Diagnosis present

## 2021-10-04 DIAGNOSIS — R197 Diarrhea, unspecified: Secondary | ICD-10-CM | POA: Diagnosis not present

## 2021-10-04 DIAGNOSIS — R0902 Hypoxemia: Secondary | ICD-10-CM | POA: Diagnosis not present

## 2021-10-04 DIAGNOSIS — Z8249 Family history of ischemic heart disease and other diseases of the circulatory system: Secondary | ICD-10-CM | POA: Diagnosis not present

## 2021-10-04 DIAGNOSIS — E876 Hypokalemia: Secondary | ICD-10-CM | POA: Diagnosis not present

## 2021-10-04 DIAGNOSIS — J690 Pneumonitis due to inhalation of food and vomit: Secondary | ICD-10-CM | POA: Diagnosis present

## 2021-10-04 DIAGNOSIS — Z86711 Personal history of pulmonary embolism: Secondary | ICD-10-CM | POA: Diagnosis present

## 2021-10-04 DIAGNOSIS — K449 Diaphragmatic hernia without obstruction or gangrene: Secondary | ICD-10-CM | POA: Diagnosis present

## 2021-10-04 DIAGNOSIS — J449 Chronic obstructive pulmonary disease, unspecified: Secondary | ICD-10-CM | POA: Diagnosis present

## 2021-10-04 DIAGNOSIS — R000 Tachycardia, unspecified: Secondary | ICD-10-CM | POA: Diagnosis not present

## 2021-10-04 DIAGNOSIS — J439 Emphysema, unspecified: Secondary | ICD-10-CM | POA: Diagnosis present

## 2021-10-04 DIAGNOSIS — R509 Fever, unspecified: Secondary | ICD-10-CM | POA: Diagnosis not present

## 2021-10-04 DIAGNOSIS — I5030 Unspecified diastolic (congestive) heart failure: Secondary | ICD-10-CM | POA: Diagnosis present

## 2021-10-04 DIAGNOSIS — Z87891 Personal history of nicotine dependence: Secondary | ICD-10-CM

## 2021-10-04 DIAGNOSIS — I959 Hypotension, unspecified: Secondary | ICD-10-CM | POA: Diagnosis not present

## 2021-10-04 LAB — RESPIRATORY PANEL BY PCR

## 2021-10-04 LAB — URINALYSIS, ROUTINE W REFLEX MICROSCOPIC
Bilirubin Urine: NEGATIVE
Glucose, UA: NEGATIVE mg/dL
Hgb urine dipstick: NEGATIVE
Ketones, ur: NEGATIVE mg/dL
Nitrite: NEGATIVE
Protein, ur: 30 mg/dL — AB
Specific Gravity, Urine: 1.013 (ref 1.005–1.030)
WBC, UA: >50 WBC/hpf — ABNORMAL HIGH (ref 0–5)
pH: 5 (ref 5.0–8.0)

## 2021-10-04 LAB — CBC WITH DIFFERENTIAL/PLATELET
Abs Immature Granulocytes: 0.22 10*3/uL — ABNORMAL HIGH (ref 0.00–0.07)
Basophils Absolute: 0 10*3/uL (ref 0.0–0.1)
Basophils Relative: 0 %
Eosinophils Absolute: 0.1 10*3/uL (ref 0.0–0.5)
Eosinophils Relative: 0 %
HCT: 36.2 % — ABNORMAL LOW (ref 39.0–52.0)
Hemoglobin: 11.6 g/dL — ABNORMAL LOW (ref 13.0–17.0)
Immature Granulocytes: 2 %
Lymphocytes Relative: 4 %
Lymphs Abs: 0.5 10*3/uL — ABNORMAL LOW (ref 0.7–4.0)
MCH: 28.9 pg (ref 26.0–34.0)
MCHC: 32 g/dL (ref 30.0–36.0)
MCV: 90.3 fL (ref 80.0–100.0)
Monocytes Absolute: 0.9 10*3/uL (ref 0.1–1.0)
Monocytes Relative: 6 %
Neutro Abs: 12.6 10*3/uL — ABNORMAL HIGH (ref 1.7–7.7)
Neutrophils Relative %: 88 %
Platelets: 230 10*3/uL (ref 150–400)
RBC: 4.01 MIL/uL — ABNORMAL LOW (ref 4.22–5.81)
RDW: 15.4 % (ref 11.5–15.5)
WBC: 14.3 10*3/uL — ABNORMAL HIGH (ref 4.0–10.5)
nRBC: 0 % (ref 0.0–0.2)

## 2021-10-04 LAB — I-STAT VENOUS BLOOD GAS, ED
Acid-Base Excess: 11 mmol/L — ABNORMAL HIGH (ref 0.0–2.0)
Bicarbonate: 33.4 mmol/L — ABNORMAL HIGH (ref 20.0–28.0)
Calcium, Ion: 1.03 mmol/L — ABNORMAL LOW (ref 1.15–1.40)
HCT: 36 % — ABNORMAL LOW (ref 39.0–52.0)
Hemoglobin: 12.2 g/dL — ABNORMAL LOW (ref 13.0–17.0)
O2 Saturation: 83 %
Potassium: 3.5 mmol/L (ref 3.5–5.1)
Sodium: 138 mmol/L (ref 135–145)
TCO2: 34 mmol/L — ABNORMAL HIGH (ref 22–32)
pCO2, Ven: 34 mmHg — ABNORMAL LOW (ref 44–60)
pH, Ven: 7.601 (ref 7.25–7.43)
pO2, Ven: 39 mmHg (ref 32–45)

## 2021-10-04 LAB — COMPREHENSIVE METABOLIC PANEL
ALT: 20 U/L (ref 0–44)
AST: 44 U/L — ABNORMAL HIGH (ref 15–41)
Albumin: 3.3 g/dL — ABNORMAL LOW (ref 3.5–5.0)
Alkaline Phosphatase: 80 U/L (ref 38–126)
Anion gap: 10 (ref 5–15)
BUN: 34 mg/dL — ABNORMAL HIGH (ref 8–23)
CO2: 33 mmol/L — ABNORMAL HIGH (ref 22–32)
Calcium: 9.5 mg/dL (ref 8.9–10.3)
Chloride: 95 mmol/L — ABNORMAL LOW (ref 98–111)
Creatinine, Ser: 1.92 mg/dL — ABNORMAL HIGH (ref 0.61–1.24)
GFR, Estimated: 34 mL/min — ABNORMAL LOW (ref >60)
Glucose, Bld: 125 mg/dL — ABNORMAL HIGH (ref 70–99)
Potassium: 4.9 mmol/L (ref 3.5–5.1)
Sodium: 138 mmol/L (ref 135–145)
Total Bilirubin: 1.9 mg/dL — ABNORMAL HIGH (ref 0.3–1.2)
Total Protein: 7 g/dL (ref 6.5–8.1)

## 2021-10-04 LAB — APTT: aPTT: 32 seconds (ref 24–36)

## 2021-10-04 LAB — PROTIME-INR
INR: 1.6 — ABNORMAL HIGH (ref 0.8–1.2)
Prothrombin Time: 18.9 seconds — ABNORMAL HIGH (ref 11.4–15.2)

## 2021-10-04 LAB — RESP PANEL BY RT-PCR (FLU A&B, COVID) ARPGX2
Influenza A by PCR: NEGATIVE
Influenza B by PCR: NEGATIVE
SARS Coronavirus 2 by RT PCR: NEGATIVE

## 2021-10-04 LAB — LACTIC ACID, PLASMA: Lactic Acid, Venous: 2.2 mmol/L (ref 0.5–1.9)

## 2021-10-04 MED ORDER — SODIUM CHLORIDE 0.9 % IV SOLN
2.0000 g | INTRAVENOUS | Status: DC
  Administered 2021-10-05 – 2021-10-07 (×3): 2 g via INTRAVENOUS
  Filled 2021-10-04 (×3): qty 20

## 2021-10-04 MED ORDER — LACTATED RINGERS IV BOLUS (SEPSIS)
500.0000 mL | Freq: Once | INTRAVENOUS | Status: AC
  Administered 2021-10-04: 500 mL via INTRAVENOUS

## 2021-10-04 MED ORDER — RIOCIGUAT 1.5 MG PO TABS: 2.0000 mg | ORAL_TABLET | Freq: Three times a day (TID) | ORAL | Status: DC

## 2021-10-04 MED ORDER — PANTOPRAZOLE SODIUM 40 MG PO TBEC
40.0000 mg | DELAYED_RELEASE_TABLET | Freq: Every morning | ORAL | Status: DC
Start: 2021-10-05 — End: 2021-10-07
  Administered 2021-10-05 – 2021-10-07 (×3): 40 mg via ORAL
  Filled 2021-10-04 (×3): qty 1

## 2021-10-04 MED ORDER — SODIUM CHLORIDE 0.9 % IV SOLN
500.0000 mg | Freq: Once | INTRAVENOUS | Status: AC
  Administered 2021-10-04: 500 mg via INTRAVENOUS
  Filled 2021-10-04: qty 5

## 2021-10-04 MED ORDER — BUDESONIDE 0.25 MG/2ML IN SUSP
0.2500 mg | Freq: Two times a day (BID) | RESPIRATORY_TRACT | Status: DC
  Administered 2021-10-04 – 2021-10-07 (×7): 0.25 mg via RESPIRATORY_TRACT
  Filled 2021-10-04 (×7): qty 2

## 2021-10-04 MED ORDER — RIOCIGUAT 2 MG PO TABS
2.0000 mg | ORAL_TABLET | Freq: Three times a day (TID) | ORAL | Status: DC
  Administered 2021-10-04 – 2021-10-07 (×8): 2 mg via ORAL
  Filled 2021-10-04 (×10): qty 1

## 2021-10-04 MED ORDER — ADULT MULTIVITAMIN W/MINERALS CH
1.0000 | ORAL_TABLET | Freq: Every day | ORAL | Status: DC
  Administered 2021-10-04 – 2021-10-07 (×4): 1 via ORAL
  Filled 2021-10-04 (×4): qty 1

## 2021-10-04 MED ORDER — LACTATED RINGERS IV BOLUS
500.0000 mL | Freq: Once | INTRAVENOUS | Status: AC
  Administered 2021-10-04: 500 mL via INTRAVENOUS

## 2021-10-04 MED ORDER — METOCLOPRAMIDE HCL 5 MG PO TABS
5.0000 mg | ORAL_TABLET | Freq: Three times a day (TID) | ORAL | Status: DC
  Administered 2021-10-04 – 2021-10-07 (×9): 5 mg via ORAL
  Filled 2021-10-04 (×9): qty 1

## 2021-10-04 MED ORDER — IPRATROPIUM-ALBUTEROL 0.5-2.5 (3) MG/3ML IN SOLN: 3.0000 mL | Freq: Four times a day (QID) | RESPIRATORY_TRACT | Status: DC | PRN

## 2021-10-04 MED ORDER — SODIUM CHLORIDE 0.9 % IV SOLN: 500.0000 mg | INTRAVENOUS | Status: DC

## 2021-10-04 MED ORDER — RIOCIGUAT 1.5 MG PO TABS: 1.5000 mg | ORAL_TABLET | Freq: Three times a day (TID) | ORAL | Status: DC

## 2021-10-04 MED ORDER — LACTATED RINGERS IV SOLN: INTRAVENOUS | Status: DC

## 2021-10-04 MED ORDER — MIDODRINE HCL 5 MG PO TABS
15.0000 mg | ORAL_TABLET | Freq: Three times a day (TID) | ORAL | Status: DC
  Administered 2021-10-04 – 2021-10-07 (×9): 15 mg via ORAL
  Filled 2021-10-04 (×9): qty 3

## 2021-10-04 MED ORDER — FINASTERIDE 5 MG PO TABS
5.0000 mg | ORAL_TABLET | Freq: Every day | ORAL | Status: DC
  Administered 2021-10-04 – 2021-10-06 (×3): 5 mg via ORAL
  Filled 2021-10-04 (×3): qty 1

## 2021-10-04 MED ORDER — DAPAGLIFLOZIN PROPANEDIOL 10 MG PO TABS: 10.0000 mg | ORAL_TABLET | Freq: Every day | ORAL | Status: DC

## 2021-10-04 MED ORDER — GABAPENTIN 300 MG PO CAPS
300.0000 mg | ORAL_CAPSULE | Freq: Three times a day (TID) | ORAL | Status: DC
  Administered 2021-10-04 – 2021-10-05 (×2): 300 mg via ORAL
  Filled 2021-10-04 (×2): qty 1

## 2021-10-04 MED ORDER — LACTATED RINGERS IV SOLN: INTRAVENOUS | Status: AC

## 2021-10-04 MED ORDER — SODIUM CHLORIDE 0.9 % IV SOLN
1.0000 g | Freq: Once | INTRAVENOUS | Status: AC
  Administered 2021-10-04: 1 g via INTRAVENOUS
  Filled 2021-10-04: qty 10

## 2021-10-04 MED ORDER — ALLOPURINOL 100 MG PO TABS
100.0000 mg | ORAL_TABLET | Freq: Every morning | ORAL | Status: DC
  Administered 2021-10-05 – 2021-10-07 (×3): 100 mg via ORAL
  Filled 2021-10-04 (×3): qty 1

## 2021-10-04 MED ORDER — APIXABAN 5 MG PO TABS
5.0000 mg | ORAL_TABLET | Freq: Two times a day (BID) | ORAL | Status: DC
  Administered 2021-10-04 – 2021-10-07 (×6): 5 mg via ORAL
  Filled 2021-10-04 (×6): qty 1

## 2021-10-04 NOTE — ED Notes (Signed)
Placed pt on nonrebreather- sats remaining in low 80's on 5L [place]. Sats improved to 95%

## 2021-10-04 NOTE — TOC Progression Note (Signed)
Transition of Care [facility]) - Progression Note    Patient Details  Name: Joshua [name] MRN: [id number] Date of Birth: [birth year]  [facility] CM/SW Contact  Zenon Mayo, RN Phone Number: 10/04/2021, 4:42 PM  Clinical Narrative:    From home , has home oxygen, sepsis, CAP, CHF, Chronic resp failue, COPD, Pulmonary HTN, CKD. , conts on ivf at 125/hr, iv abx. On Droplet precautions. TOC will cont to follow for dc needs.          Expected Discharge Plan and Services                                                 Social Determinants of Health (SDOH) Interventions    Readmission Risk Interventions No flowsheet data found.

## 2021-10-04 NOTE — Progress Notes (Signed)
Spoke to patient and patient's daughter about the home med "riociguat" dosage. RN verified with patient and patient daughter at bedside that the patient takes one tablet of the 93m dose three times a day with meals. MD paged to change order. Pharmacy made aware of verification.

## 2021-10-04 NOTE — ED Provider Notes (Signed)
[facility] EMERGENCY DEPARTMENT Provider Note   CSN: [id number] Arrival date & time: 10/04/21  4081     History  Chief Complaint  Patient presents with   Weakness   Shortness of Breath   Emesis   Diarrhea    Joshua [name] is a 86 y.o. male.  Patient is an 86 year old male with multiple medical problems including CHF with preserved EF of 55 to 60%, pulmonary hypertension on medication, CKD, COPD on 2 L of oxygen chronically, chronic hypotension on 15 mg of midodrine 3 times daily, hypertension, chronic PE on Eliquis who is presenting today with EMS for 2 days of worsening generalized symptoms and shortness of breath.  Patient reports 2 days ago he started getting vomiting and diarrhea.  He reports he had up to 20 episodes of vomiting over the last few days as well is the same for diarrhea.  He has had a mild cough but denies any sputum production.  He thinks he started getting a fever yesterday and feeling more short of breath on his home 2 L.  He has had very little oral intake and reports that he has not really been holding down his medications.  Today he was trying to go downstairs to drink a cup of coffee and became so weak and short of breath he could not really make it down the stairs.  When EMS arrived they reported his oxygen saturation was 69% on 2 L with a good waveform.  He was placed on a nonrebreather with improvement in his oxygen saturation.  Patient reports he feels short of breath and generally unwell but denies any abdominal pain or chest pain.  The history is provided by the patient, the EMS personnel and medical records.  Weakness Associated symptoms: diarrhea, shortness of breath and vomiting   Shortness of Breath Associated symptoms: vomiting   Emesis Associated symptoms: diarrhea   Diarrhea Associated symptoms: vomiting       Home Medications Prior to Admission medications   Medication Sig Start Date End Date Taking? Authorizing Provider   albuterol (VENTOLIN HFA) 108 (90 Base) MCG/ACT inhaler Inhale 1 puff into the lungs every 4 (four) hours as needed for shortness of breath.    [provider]  allopurinol (ZYLOPRIM) 100 MG tablet Take 100 mg by mouth every morning.    [provider]  apixaban (ELIQUIS) 5 MG TABS tablet Take 1 tablet (5 mg total) by mouth 2 (two) times daily. 02/28/21   Larey Dresser, MD  Azelastine HCl 0.15 % SOLN Place 2 sprays into the nose daily as needed (allergies). 02/22/21   [provider]  Cinnamon 500 MG capsule Take 500 mg by mouth every morning.    [provider]  Ensure (ENSURE) Take 237 mLs by mouth daily.    [provider]  finasteride (PROSCAR) 5 MG tablet Take 5 mg by mouth at bedtime.    [provider]  gabapentin (NEURONTIN) 100 MG capsule Take 300 mg by mouth 3 (three) times daily. 11/27/20   [provider]  ipratropium (ATROVENT) 0.03 % nasal spray Place 1 spray into both nostrils daily as needed (allergies). 03/29/21   [provider]  magnesium oxide (MAG-OX) 400 MG tablet Take 400 mg by mouth.    [provider]  metoCLOPramide (REGLAN) 5 MG tablet Take 5 mg by mouth 3 (three) times daily before meals.    [provider]  midodrine (PROAMATINE) 5 MG tablet Take 3 tablets (15  mg total) by mouth 3 (three) times daily with meals. 06/18/21   Larey Dresser, MD  Multiple Vitamin (MULTIVITAMIN WITH MINERALS) TABS tablet Take 1 tablet by mouth daily. 05/01/21   Sheikh, Omair Latif, DO  ondansetron (ZOFRAN) 4 MG tablet Take 1 tablet (4 mg total) by mouth every 6 (six) hours as needed for nausea. 04/30/21   Raiford Noble Latif, DO  OXYGEN Inhale 2 L into the lungs as needed for shortness of breath.    [provider]  pantoprazole (PROTONIX) 40 MG tablet Take 40 mg by mouth every morning.    [provider]  potassium chloride (KLOR-CON) 10 MEQ tablet TAKE 2 TABLETS BY MOUTH 2 TIMES DAILY.  09/13/21   Larey Dresser, MD  Potassium Chloride ER 20 MEQ TBCR Take 20 mEq by mouth daily. 06/14/21   [provider]  Riociguat (ADEMPAS) 1.5 MG TABS Take 2 mg by mouth in the morning, at noon, and at bedtime. 09/11/21   Larey Dresser, MD  tamsulosin (FLOMAX) 0.4 MG CAPS capsule Take 0.4 mg by mouth daily.    [provider]  Tiotropium Bromide-Olodaterol 2.5-2.5 MCG/ACT AERS Inhale 2 puffs into the lungs daily. 05/30/21   [name], MD  torsemide (DEMADEX) 20 MG tablet Take 2 tablets (40 mg total) by mouth daily. 07/17/21 10/15/21  Rafael Bihari, FNP      Allergies    Other, Sunflower oil, and Sulfa antibiotics    Review of Systems   Review of Systems  Respiratory:  Positive for shortness of breath.   Gastrointestinal:  Positive for diarrhea and vomiting.  Neurological:  Positive for weakness.   Physical Exam Updated Vital Signs BP (!) 89/51    Pulse (!) 113    Temp (!) 101 F (38.3 C) (Oral)    Resp 13    Ht _0  (1.905 m)    Wt 81.6 kg    SpO2 90%    BMI 22.50 kg/m  Physical Exam Vitals and nursing note reviewed.  Constitutional:      General: He is not in acute distress.    Appearance: He is well-developed. He is ill-appearing.     Comments: Chronically ill-appearing currently with the hiccups  HENT:     Head: Normocephalic and atraumatic.     Mouth/Throat:     Mouth: Mucous membranes are dry.  Eyes:     Conjunctiva/sclera: Conjunctivae normal.     Pupils: Pupils are equal, round, and reactive to light.  Cardiovascular:     Rate and Rhythm: Regular rhythm. Tachycardia present.     Heart sounds: No murmur heard. Pulmonary:     Effort: Pulmonary effort is normal. Tachypnea present. No respiratory distress.     Breath sounds: Examination of the left-middle field reveals rhonchi. Examination of the right-lower field reveals rhonchi. Examination of the left-lower field reveals rhonchi. Rhonchi present. No wheezing or rales.  Abdominal:      General: There is no distension.     Palpations: Abdomen is soft.     Tenderness: There is no abdominal tenderness. There is no guarding or rebound.  Musculoskeletal:        General: No tenderness. Normal range of motion.     Cervical back: Normal range of motion and neck supple.     Right lower leg: No edema.     Left lower leg: No edema.  Skin:    General: Skin is warm and dry.     Findings: No erythema  or rash.  Neurological:     General: No focal deficit present.     Mental Status: He is alert. He is disoriented.  Psychiatric:        Mood and Affect: Mood normal.        Behavior: Behavior normal.    ED Results / Procedures / Treatments   Labs (all labs ordered are listed, but only abnormal results are displayed) Labs Reviewed  LACTIC ACID, PLASMA - Abnormal; Notable for the following components:      Result Value   Lactic Acid, Venous 2.2 (*)    All other components within normal limits  COMPREHENSIVE METABOLIC PANEL - Abnormal; Notable for the following components:   Chloride 95 (*)    CO2 33 (*)    Glucose, Bld 125 (*)    BUN 34 (*)    Creatinine, Ser 1.92 (*)    Albumin 3.3 (*)    AST 44 (*)    Total Bilirubin 1.9 (*)    GFR, Estimated 34 (*)    All other components within normal limits  CBC WITH DIFFERENTIAL/PLATELET - Abnormal; Notable for the following components:   WBC 14.3 (*)    RBC 4.01 (*)    Hemoglobin 11.6 (*)    HCT 36.2 (*)    Neutro Abs 12.6 (*)    Lymphs Abs 0.5 (*)    Abs Immature Granulocytes 0.22 (*)    All other components within normal limits  PROTIME-INR - Abnormal; Notable for the following components:   Prothrombin Time 18.9 (*)    INR 1.6 (*)    All other components within normal limits  I-STAT VENOUS BLOOD GAS, ED - Abnormal; Notable for the following components:   pH, Ven 7.601 (*)    pCO2, Ven 34.0 (*)    Bicarbonate 33.4 (*)    TCO2 34 (*)    Acid-Base Excess 11.0 (*)    Calcium, Ion 1.03 (*)    HCT 36.0 (*)    Hemoglobin  12.2 (*)    All other components within normal limits  RESP PANEL BY RT-PCR (FLU A&B, COVID) ARPGX2  CULTURE, BLOOD (ROUTINE X 2)  CULTURE, BLOOD (ROUTINE X 2)  URINE CULTURE  APTT  LACTIC ACID, PLASMA  URINALYSIS, ROUTINE W REFLEX MICROSCOPIC    EKG EKG Interpretation  Date/Time:  Thursday October 04 2021 09:14:27 EST Ventricular Rate:  131 PR Interval:  114 QRS Duration: 168 QT Interval:  424 QTC Calculation: 629 R Axis:   133 Text Interpretation: Sinus tachycardia Right bundle branch block No significant change since last tracing Confirmed by Blanchie Dessert [id number]) on 10/04/2021 9:30:09 AM  Radiology DG Chest Port 1 View  Result Date: 10/04/2021 CLINICAL DATA:  Worsening dyspnea, nausea, vomiting, diarrhea, questionable sepsis EXAM: PORTABLE CHEST 1 VIEW COMPARISON:  04/24/2021 chest radiograph. FINDINGS: Stable cardiomediastinal silhouette with mild cardiomegaly. No pneumothorax. No pleural effusion. Patchy opacity throughout the mid to lower left lung is new. IMPRESSION: New patchy opacity throughout the mid to lower left lung, suspicious for pneumonia. Recommend follow-up chest radiographs to resolution. Electronically Signed   By: Ilona Sorrel M.D.   On: 10/04/2021 09:41    Procedures Procedures    Medications Ordered in ED Medications  lactated ringers infusion ( Intravenous New Bag/Given 10/04/21 1008)  azithromycin (ZITHROMAX) 500 mg in sodium chloride 0.9 % 250 mL IVPB (500 mg Intravenous New Bag/Given 10/04/21 1016)  midodrine (PROAMATINE) tablet 15 mg (15 mg Oral Given 10/04/21 1033)  lactated ringers bolus 500 mL (  0 mLs Intravenous Stopped 10/04/21 1033)  cefTRIAXone (ROCEPHIN) 1 g in sodium chloride 0.9 % 100 mL IVPB (1 g Intravenous New Bag/Given 10/04/21 1011)    ED Course/ Medical Decision Making/ A&P                           Medical Decision Making Amount and/or Complexity of Data Reviewed Labs: ordered. Radiology: ordered. ECG/medicine tests:  ordered.  Risk Prescription drug management.   Patient is an elderly male with multiple medical problems and comorbidities presenting today with findings concerning for sepsis.  He has had systemic symptoms for the last 2 days that have been getting worse.  He was hypoxic upon arrival by EMS and placed on a nonrebreather.  Here patient is awake and alert and able to give his full history.  Additional history was obtained by EMS and external medical records from his recent cardiology visit.  Patient was placed on continuous cardiac monitoring and does have tachycardia but appears to be sinus tachycardia.  Oxygen saturation greater than 90.  Patient does have low blood pressure today in the mid 23F systolic however on review of prior medical records this is patient's baseline he does take 15 mg of midodrine 3 times daily but has not had any today.  He denies any abdominal pain or chest pain.  He is not an extremis at this time.  I independently reviewed and interpreted his chest x-ray which shows an infiltrate on the left side.  He does not have findings suggestive of fluid overload today and his most recent EF was 55 to 60% 1 year ago.  Low suspicion for CHF at this time.  Concern for sepsis.  Patient was covered with ceftriaxone and azithromycin as he has not recently been hospitalized and would be community-acquired pneumonia.  I independently interpreted patient's labs and EKG.  Lab today show a venous blood gas with alkalosis and CO2 which was slightly low at 34.  Bicarb is elevated at 33 which is most likely chronic from his COPD.  CMP today with unchanged renal function with a creatinine of 1.9 which is the same as his most recent finding 1 month ago, CBC with leukocytosis of 14,000 and stable hemoglobin of 11.  Coags without acute findings, COVID and flu are negative, lactic acid mildly elevated at 2.2.  Patient initially was given 500 mL of fluid and started on a rate but after lactic acid came back  increased it to another 500 so a total of 1 L bolus.  He continues to [employer] well.  Feel that patient meets criteria for admission.  He was given his oral midodrine and will follow his blood pressure closely.  Findings discussed with the patient and his family member who are at bedside.  CRITICAL CARE Performed by: Vong Garringer Total critical care time: 30 minutes Critical care time was exclusive of separately billable procedures and treating other patients. Critical care was necessary to treat or prevent imminent or life-threatening deterioration. Critical care was time spent personally by me on the following activities: development of treatment plan with patient and/or surrogate as well as nursing, discussions with consultants, evaluation of patient's response to treatment, examination of patient, obtaining history from patient or surrogate, ordering and performing treatments and interventions, ordering and review of laboratory studies, ordering and review of radiographic studies, pulse oximetry and re-evaluation of patient's condition.         Final Clinical Impression(s) / ED Diagnoses  Final diagnoses:  Community acquired pneumonia of left lung, unspecified part of lung  Sepsis without acute organ dysfunction, due to unspecified organism (Joshua [name])  Acute on chronic respiratory failure with hypoxia [facility])    Rx / DC Orders ED Discharge Orders     None         Blanchie Dessert, MD 10/04/21 1049

## 2021-10-04 NOTE — ED Triage Notes (Signed)
Pt BIB [facility] from home with complaints of weakness, upper respiratory symptoms, n/v/d more than 20 times over the last couple of days, SHOB, on 2 L home O2 but satting at 68% when EMS arrived. Placed on 15 L NRB improved to 95%. Pt temp 101 w/ EMS- gave 1 g of tylenol. BP 90/60- gave 150 NS. Pt tachypneic and tachycardic with rates in the 130s.

## 2021-10-04 NOTE — Sepsis Progress Note (Signed)
Notified provider of need to order fluid bolus. Please see Dr Plunkett's note regarding the fluid and patient's normal BP readings.

## 2021-10-04 NOTE — Sepsis Progress Note (Signed)
eLink is monitoring this Code Sepsis.

## 2021-10-04 NOTE — Progress Notes (Signed)
Patient's wife brought home med "riociguiat" to be stored and dispensed three times a day. Nate RN at bedside to verify count with primary RN. Patient verbalize understanding about medication being dispensed and has signed the form. Form and medication sent down to pharmacy by primary RN. Form placed in chart.

## 2021-10-04 NOTE — H&P (Addendum)
History and Physical    Joshua [name] HYW:[id number] DOB: [birth year] DOA: 10/04/2021  I have briefly reviewed the patient's prior medical records in [place]  PCP: [name], DO  Patient coming from: home  Chief Complaint: shortness of breath, weakness, nausea, vomiting, diarrhea  HPI: Joshua [name] is a 86 y.o. male with medical history significant of COPD with chronic hypoxic respiratory failure on 2 L at home, diastolic CHF, RV failure, pulmonary hypertension seen by CHF team as an outpatient, chronic hypotension on midodrine, prior PE on anticoagulation, chronic kidney disease stage IIIa with baseline creatinine 1.3-1.7, BPH, presents to the hospital with complaints of shortness of breath and weakness.  Patient reports that he has been dealing with constipation for the past few weeks, has been using several over-the-counter laxatives, and about 3 days ago he started experiencing profuse diarrhea, associated with nausea and several episodes of emesis.  He also reports poor p.o. intake over the last 2 to 3 days due to that but has been taking his home medications as directed.  Since yesterday wife also reports a productive cough.  He denies any subjective fever or chills, no chest pain.  He found himself more and more short of breath and today was barely able to get up and was too weak to walk.  No lightheadedness or dizziness.  He is vomiting and diarrhea have resolved since yesterday but weakness persisted, and he decided to come to the ED.  ED Course: In the emergency room he is febrile to 101, initial heart rate was 110 and his blood pressure has been in the 48N systolic.  Chest x-ray showed new patchy opacities throughout the mid to lower left lung, suspicious for pneumonia.  Blood work showed a creatinine of 1.9, white count 14.3.  He was found to be hypoxic on home 2 L and has been placed on 4 L.  He was given midodrine because he missed his dose this morning, we are asked to  admit to the hospital  Review of Systems: All systems reviewed, and apart from HPI, all negative  Past Medical History:  Diagnosis Date   Arthritis    BPH (benign prostatic hyperplasia)    COPD (chronic obstructive pulmonary disease) ([place])    Folliculitis    posterior scalp per office visit note of Dr Maudie Mercury 07/20/2014     GERD (gastroesophageal reflux disease)    Hypertension    PE (pulmonary thromboembolism) ([place])    Phlebitis    right arm  at least 20 years ago    Pneumonia    hx of pneumonia as a child    Sleep apnea     Past Surgical History:  Procedure Laterality Date   BIOPSY  11/03/2020   Procedure: BIOPSY;  Surgeon: Carol Ada, MD;  Location: [facility];  Service: Endoscopy;;   BIOPSY  07/06/2021   Procedure: BIOPSY;  Surgeon: Carol Ada, MD;  Location: [facility];  Service: Endoscopy;;   bone removed from little toe right foot      CHOLECYSTECTOMY     ESOPHAGOGASTRODUODENOSCOPY (EGD) WITH PROPOFOL N/A 11/03/2020   Procedure: ESOPHAGOGASTRODUODENOSCOPY (EGD) WITH PROPOFOL;  Surgeon: Carol Ada, MD;  Location: [facility];  Service: Endoscopy;  Laterality: N/A;   ESOPHAGOGASTRODUODENOSCOPY (EGD) WITH PROPOFOL N/A 07/06/2021   Procedure: ESOPHAGOGASTRODUODENOSCOPY (EGD) WITH PROPOFOL;  Surgeon: Carol Ada, MD;  Location: [facility];  Service: Endoscopy;  Laterality: N/A;   pilonidal cyst removal      RIGHT HEART CATH N/A 12/13/2020  Procedure: RIGHT HEART CATH;  Surgeon: Larey Dresser, MD;  Location: [facility];  Service: Cardiovascular;  Laterality: N/A;   TOTAL KNEE ARTHROPLASTY Left 09/13/2014   Procedure: LEFT TOTAL KNEE ARTHROPLASTY;  Surgeon: Mauri Pole, MD;  Location: [facility];  Service: Orthopedics;  Laterality: Left;     reports that he quit smoking about 15 years ago. His smoking use included cigarettes. He started smoking about 67 years ago. He has a 50.00 pack-year smoking history. He has never used smokeless tobacco. He reports that  he does not currently use alcohol after a past usage of about 6.0 standard drinks per week. He reports that he does not use drugs.  Allergies  Allergen Reactions   Other Swelling    Beer- Swelling    Sunflower Oil Swelling   Sulfa Antibiotics Rash    Family History  Problem Relation Age of Onset   Heart attack Brother 57    Prior to Admission medications   Medication Sig Start Date End Date Taking? Authorizing Provider  albuterol (VENTOLIN HFA) 108 (90 Base) MCG/ACT inhaler Inhale 1 puff into the lungs every 4 (four) hours as needed for shortness of breath.    [provider]  allopurinol (ZYLOPRIM) 100 MG tablet Take 100 mg by mouth every morning.    [provider]  apixaban (ELIQUIS) 5 MG TABS tablet Take 1 tablet (5 mg total) by mouth 2 (two) times daily. 02/28/21   Larey Dresser, MD  Azelastine HCl 0.15 % SOLN Place 2 sprays into the nose daily as needed (allergies). 02/22/21   [provider]  Cinnamon 500 MG capsule Take 500 mg by mouth every morning.    [provider]  Ensure (ENSURE) Take 237 mLs by mouth daily.    [provider]  finasteride (PROSCAR) 5 MG tablet Take 5 mg by mouth at bedtime.    [provider]  gabapentin (NEURONTIN) 100 MG capsule Take 300 mg by mouth 3 (three) times daily. 11/27/20   [provider]  ipratropium (ATROVENT) 0.03 % nasal spray Place 1 spray into both nostrils daily as needed (allergies). 03/29/21   [provider]  magnesium oxide (MAG-OX) 400 MG tablet Take 400 mg by mouth.    [provider]  metoCLOPramide (REGLAN) 5 MG tablet Take 5 mg by mouth 3 (three) times daily before meals.    [provider]  midodrine (PROAMATINE) 5 MG tablet Take 3 tablets (15 mg total) by mouth 3 (three) times daily with meals. 06/18/21   Larey Dresser, MD  Multiple Vitamin (MULTIVITAMIN WITH MINERALS) TABS tablet Take 1 tablet by mouth daily. 05/01/21   Sheikh, Omair  Latif, DO  ondansetron (ZOFRAN) 4 MG tablet Take 1 tablet (4 mg total) by mouth every 6 (six) hours as needed for nausea. 04/30/21   Raiford Noble Latif, DO  OXYGEN Inhale 2 L into the lungs as needed for shortness of breath.    [provider]  pantoprazole (PROTONIX) 40 MG tablet Take 40 mg by mouth every morning.    [provider]  potassium chloride (KLOR-CON) 10 MEQ tablet TAKE 2 TABLETS BY MOUTH 2 TIMES DAILY. 09/13/21   Larey Dresser, MD  Potassium Chloride ER 20 MEQ TBCR Take 20 mEq by mouth daily. 06/14/21   [provider]  Riociguat (ADEMPAS) 1.5 MG TABS Take 2 mg by mouth in the morning, at noon, and at bedtime. 09/11/21   Larey Dresser, MD  tamsulosin (  FLOMAX) 0.4 MG CAPS capsule Take 0.4 mg by mouth daily.    [provider]  Tiotropium Bromide-Olodaterol 2.5-2.5 MCG/ACT AERS Inhale 2 puffs into the lungs daily. 05/30/21   [name], MD  torsemide (DEMADEX) 20 MG tablet Take 2 tablets (40 mg total) by mouth daily. 07/17/21 10/15/21  Rafael Bihari, FNP    Physical Exam: Vitals:   10/04/21 1000 10/04/21 1045 10/04/21 1145 10/04/21 1200  BP: (!) 89/51 (!) 76/50 (!) 87/53 (!) 83/51  Pulse: (!) 113 95 86 87  Resp: _0 Temp:      TempSrc:      SpO2: 90% 94% 93% (!) 89%  Weight:      Height:        Constitutional: NAD, calm, comfortable Eyes: PERRL, lids and conjunctivae normal ENMT: Mucous membranes are dry.  Neck: normal, supple Respiratory: Faint bibasilar rhonchi, no wheezing, overall moves air well Cardiovascular: Regular rate and rhythm, no murmurs / rubs / gallops. No extremity edema. 2+ pedal pulses.  Abdomen: no tenderness, no masses palpated. Bowel sounds positive.  Musculoskeletal: no clubbing / cyanosis. Normal muscle tone.  Skin: no rashes, lesions, ulcers. No induration Neurologic: CN 2-12 grossly intact. Strength 5/5 in all 4.  Psychiatric: Normal judgment and insight. Alert and oriented x 3. Normal  mood.   Labs on Admission: I have personally reviewed following labs and imaging studies  CBC: Recent Labs  Lab 10/04/21 0920 10/04/21 1008  WBC 14.3*  --   NEUTROABS 12.6*  --   HGB 11.6* 12.2*  HCT 36.2* 36.0*  MCV 90.3  --   PLT 230  --    Basic Metabolic Panel: Recent Labs  Lab 10/04/21 0920 10/04/21 1008  NA 138 138  K 4.9 3.5  CL 95*  --   CO2 33*  --   GLUCOSE 125*  --   BUN 34*  --   CREATININE 1.92*  --   CALCIUM 9.5  --    Liver Function Tests: Recent Labs  Lab 10/04/21 0920  AST 44*  ALT 20  ALKPHOS 80  BILITOT 1.9*  PROT 7.0  ALBUMIN 3.3*   Coagulation Profile: Recent Labs  Lab 10/04/21 0920  INR 1.6*   BNP (last 3 results) No results for input(s): PROBNP in the last 8760 hours. CBG: No results for input(s): GLUCAP in the last 168 hours. Thyroid Function Tests: No results for input(s): TSH, T4TOTAL, FREET4, T3FREE, THYROIDAB in the last 72 hours. Urine analysis:    Component Value Date/Time   COLORURINE AMBER (A) 04/24/2021 1034   APPEARANCEUR CLEAR 04/24/2021 1034   LABSPEC 1.010 04/24/2021 1034   PHURINE 5.0 04/24/2021 1034   GLUCOSEU NEGATIVE 04/24/2021 1034   HGBUR SMALL (A) 04/24/2021 1034   BILIRUBINUR NEGATIVE 04/24/2021 1034   [place] 04/24/2021 1034   PROTEINUR NEGATIVE 04/24/2021 1034   UROBILINOGEN 0.2 09/08/2014 0927   NITRITE NEGATIVE 04/24/2021 1034   LEUKOCYTESUR SMALL (A) 04/24/2021 1034     Radiological Exams on Admission: DG Chest Port 1 View  Result Date: 10/04/2021 CLINICAL DATA:  Worsening dyspnea, nausea, vomiting, diarrhea, questionable sepsis EXAM: PORTABLE CHEST 1 VIEW COMPARISON:  04/24/2021 chest radiograph. FINDINGS: Stable cardiomediastinal silhouette with mild cardiomegaly. No pneumothorax. No pleural effusion. Patchy opacity throughout the mid to lower left lung is new. IMPRESSION: New patchy opacity throughout the mid to lower left lung, suspicious for pneumonia. Recommend follow-up chest  radiographs to resolution. Electronically Signed   By: Ilona Sorrel  M.D.   On: 10/04/2021 09:41    EKG: Independently reviewed.  Sinus tachycardia, right bundle branch block  Assessment/Plan Principal Problem:   Sepsis ([place]) Active Problems:   CAP (community acquired pneumonia)   History of pulmonary embolism   Chronic respiratory failure with hypoxia (H[facility])   COPD (chronic obstructive pulmonary disease) (H[facility])   BPH with urinary obstruction   Diastolic CHF ([place])   Pulmonary HTN (H[facility])   Chronic right-sided heart failure ([facility])   Stage 3a chronic kidney disease (CKD) (H[facility])   Gout   Acute on chronic respiratory failure with hypoxia ([place])  Principal problem Sepsis due to community-acquired pneumonia-patient initially experienced GI symptoms 3 days ago and then had a lot of nausea and vomiting.  Possibly his pneumonia could have an aspiration component to it or this could all be part of viral illness.  Continue ceftriaxone and azithromycin.  Flu and COVID are negative.  Obtain RVP. -Given heart failure, provide limited IV fluids  Active problems Chronic hypotension-outpatient blood pressure in office is in the 07K systolic.  Continue midodrine He is remarkably asymptomatic currently.  Looks a little bit dehydrated, limited IV fluids  Chronic diastolic CHF, pulmonary hypertension, RV failure-seen by cardiology as an outpatient.  Awaiting medication reconciliation before home meds could be resumed.  Hold torsemide for now given slight dehydration.  Recent echo showed EF 55-60%, IV septum flattened, severe RV enlargement and severely decreased RV function, PASP 57 mmHg.  COPD, acute on chronic hypoxic respiratory failure -requiring more oxygen now, currently on 5 L but at baseline he is on 2 L.  Placed on nebulizers, continue supplemental oxygen  History of PE-resume Eliquis once pharmacy reconciled home medications  Chronic kidney disease stage IIIa-baseline creatinine 1.3-1.7, currently at  1.9 so close to baseline.  Closely monitor  BPH-resume home medications once home medications are reconciled.  Closely monitor, has a history of acute urinary retention  Gout-resume home medications once pharmacy reconciled his meds   DVT prophylaxis: Eliquis Code Status: Full code per patient's wishes.  Wife at bedside asks him to make up his mind since he was DNR in the past Family Communication: Wife at bedside Disposition Plan: Likely home when ready Bed Type: Progressive Consults called: None Obs/Inp: obs  Marzetta Board, MD, PhD [facility]  Contact via [email or web address]  10/04/2021, 12:17 PM

## 2021-10-04 NOTE — Sepsis Progress Note (Signed)
Notified bedside nurse of need to draw repeat lactic acid. 

## 2021-10-05 ENCOUNTER — Other Ambulatory Visit: Payer: Self-pay | Admitting: *Deleted

## 2021-10-05 ENCOUNTER — Telehealth (HOSPITAL_COMMUNITY): Payer: Self-pay | Admitting: Internal Medicine

## 2021-10-05 DIAGNOSIS — Z7189 Other specified counseling: Secondary | ICD-10-CM | POA: Diagnosis not present

## 2021-10-05 DIAGNOSIS — Z87891 Personal history of nicotine dependence: Secondary | ICD-10-CM | POA: Diagnosis not present

## 2021-10-05 DIAGNOSIS — I5032 Chronic diastolic (congestive) heart failure: Secondary | ICD-10-CM

## 2021-10-05 DIAGNOSIS — R7303 Prediabetes: Secondary | ICD-10-CM | POA: Insufficient documentation

## 2021-10-05 DIAGNOSIS — I130 Hypertensive heart and chronic kidney disease with heart failure and stage 1 through stage 4 chronic kidney disease, or unspecified chronic kidney disease: Secondary | ICD-10-CM | POA: Diagnosis not present

## 2021-10-05 DIAGNOSIS — E860 Dehydration: Secondary | ICD-10-CM | POA: Diagnosis present

## 2021-10-05 DIAGNOSIS — M109 Gout, unspecified: Secondary | ICD-10-CM | POA: Diagnosis present

## 2021-10-05 DIAGNOSIS — K449 Diaphragmatic hernia without obstruction or gangrene: Secondary | ICD-10-CM | POA: Insufficient documentation

## 2021-10-05 DIAGNOSIS — Z882 Allergy status to sulfonamides status: Secondary | ICD-10-CM | POA: Diagnosis not present

## 2021-10-05 DIAGNOSIS — I272 Pulmonary hypertension, unspecified: Secondary | ICD-10-CM

## 2021-10-05 DIAGNOSIS — N1832 Chronic kidney disease, stage 3b: Secondary | ICD-10-CM | POA: Diagnosis not present

## 2021-10-05 DIAGNOSIS — K2100 Gastro-esophageal reflux disease with esophagitis, without bleeding: Secondary | ICD-10-CM | POA: Diagnosis present

## 2021-10-05 DIAGNOSIS — I2782 Chronic pulmonary embolism: Secondary | ICD-10-CM | POA: Diagnosis not present

## 2021-10-05 DIAGNOSIS — E876 Hypokalemia: Secondary | ICD-10-CM | POA: Diagnosis not present

## 2021-10-05 DIAGNOSIS — R269 Unspecified abnormalities of gait and mobility: Secondary | ICD-10-CM | POA: Insufficient documentation

## 2021-10-05 DIAGNOSIS — R066 Hiccough: Secondary | ICD-10-CM | POA: Insufficient documentation

## 2021-10-05 DIAGNOSIS — N179 Acute kidney failure, unspecified: Secondary | ICD-10-CM | POA: Diagnosis not present

## 2021-10-05 DIAGNOSIS — K219 Gastro-esophageal reflux disease without esophagitis: Secondary | ICD-10-CM | POA: Insufficient documentation

## 2021-10-05 DIAGNOSIS — Z96652 Presence of left artificial knee joint: Secondary | ICD-10-CM | POA: Diagnosis present

## 2021-10-05 DIAGNOSIS — Z9981 Dependence on supplemental oxygen: Secondary | ICD-10-CM | POA: Insufficient documentation

## 2021-10-05 DIAGNOSIS — Z20822 Contact with and (suspected) exposure to covid-19: Secondary | ICD-10-CM | POA: Diagnosis not present

## 2021-10-05 DIAGNOSIS — N138 Other obstructive and reflux uropathy: Secondary | ICD-10-CM | POA: Diagnosis not present

## 2021-10-05 DIAGNOSIS — I5082 Biventricular heart failure: Secondary | ICD-10-CM | POA: Diagnosis present

## 2021-10-05 DIAGNOSIS — A419 Sepsis, unspecified organism: Secondary | ICD-10-CM | POA: Diagnosis not present

## 2021-10-05 DIAGNOSIS — J439 Emphysema, unspecified: Secondary | ICD-10-CM | POA: Diagnosis not present

## 2021-10-05 DIAGNOSIS — J189 Pneumonia, unspecified organism: Secondary | ICD-10-CM | POA: Diagnosis present

## 2021-10-05 DIAGNOSIS — I9589 Other hypotension: Secondary | ICD-10-CM | POA: Diagnosis present

## 2021-10-05 DIAGNOSIS — Z515 Encounter for palliative care: Secondary | ICD-10-CM | POA: Diagnosis not present

## 2021-10-05 DIAGNOSIS — E7800 Pure hypercholesterolemia, unspecified: Secondary | ICD-10-CM | POA: Insufficient documentation

## 2021-10-05 DIAGNOSIS — J9621 Acute and chronic respiratory failure with hypoxia: Secondary | ICD-10-CM | POA: Diagnosis not present

## 2021-10-05 DIAGNOSIS — I2724 Chronic thromboembolic pulmonary hypertension: Secondary | ICD-10-CM | POA: Diagnosis present

## 2021-10-05 DIAGNOSIS — Z8249 Family history of ischemic heart disease and other diseases of the circulatory system: Secondary | ICD-10-CM | POA: Diagnosis not present

## 2021-10-05 DIAGNOSIS — J690 Pneumonitis due to inhalation of food and vomit: Secondary | ICD-10-CM | POA: Diagnosis not present

## 2021-10-05 LAB — CBC
HCT: 29.2 % — ABNORMAL LOW (ref 39.0–52.0)
Hemoglobin: 9.8 g/dL — ABNORMAL LOW (ref 13.0–17.0)
MCH: 29.5 pg (ref 26.0–34.0)
MCHC: 33.6 g/dL (ref 30.0–36.0)
MCV: 88 fL (ref 80.0–100.0)
Platelets: 174 10*3/uL (ref 150–400)
RBC: 3.32 MIL/uL — ABNORMAL LOW (ref 4.22–5.81)
RDW: 15.3 % (ref 11.5–15.5)
WBC: 13.5 10*3/uL — ABNORMAL HIGH (ref 4.0–10.5)
nRBC: 0 % (ref 0.0–0.2)

## 2021-10-05 LAB — COMPREHENSIVE METABOLIC PANEL
ALT: 10 U/L (ref 0–44)
AST: 17 U/L (ref 15–41)
Albumin: 2.5 g/dL — ABNORMAL LOW (ref 3.5–5.0)
Alkaline Phosphatase: 56 U/L (ref 38–126)
Anion gap: 8 (ref 5–15)
BUN: 28 mg/dL — ABNORMAL HIGH (ref 8–23)
CO2: 31 mmol/L (ref 22–32)
Calcium: 9.3 mg/dL (ref 8.9–10.3)
Chloride: 96 mmol/L — ABNORMAL LOW (ref 98–111)
Creatinine, Ser: 1.44 mg/dL — ABNORMAL HIGH (ref 0.61–1.24)
GFR, Estimated: 48 mL/min — ABNORMAL LOW (ref >60)
Glucose, Bld: 84 mg/dL (ref 70–99)
Potassium: 3.4 mmol/L — ABNORMAL LOW (ref 3.5–5.1)
Sodium: 135 mmol/L (ref 135–145)
Total Bilirubin: 0.8 mg/dL (ref 0.3–1.2)
Total Protein: 5.6 g/dL — ABNORMAL LOW (ref 6.5–8.1)

## 2021-10-05 LAB — HIV ANTIBODY (ROUTINE TESTING W REFLEX): HIV Screen 4th Generation wRfx: NONREACTIVE

## 2021-10-05 MED ORDER — TORSEMIDE 20 MG PO TABS
40.0000 mg | ORAL_TABLET | Freq: Every day | ORAL | Status: DC
  Administered 2021-10-06 – 2021-10-07 (×2): 40 mg via ORAL
  Filled 2021-10-05 (×2): qty 2

## 2021-10-05 MED ORDER — POTASSIUM CHLORIDE CRYS ER 20 MEQ PO TBCR
40.0000 meq | EXTENDED_RELEASE_TABLET | Freq: Once | ORAL | Status: AC
  Administered 2021-10-05: 40 meq via ORAL
  Filled 2021-10-05: qty 2

## 2021-10-05 MED ORDER — DAPAGLIFLOZIN PROPANEDIOL 10 MG PO TABS
10.0000 mg | ORAL_TABLET | Freq: Every day | ORAL | Status: DC
  Filled 2021-10-05: qty 1

## 2021-10-05 MED ORDER — TORSEMIDE 20 MG PO TABS: 40.0000 mg | ORAL_TABLET | Freq: Every day | ORAL | Status: DC

## 2021-10-05 MED ORDER — GABAPENTIN 300 MG PO CAPS
300.0000 mg | ORAL_CAPSULE | Freq: Three times a day (TID) | ORAL | Status: DC
Start: 2021-10-05 — End: 2021-10-07
  Administered 2021-10-05 – 2021-10-07 (×6): 300 mg via ORAL
  Filled 2021-10-05 (×6): qty 1

## 2021-10-05 MED ORDER — FUROSEMIDE 10 MG/ML IJ SOLN
60.0000 mg | Freq: Once | INTRAMUSCULAR | Status: AC
  Administered 2021-10-05: 60 mg via INTRAVENOUS
  Filled 2021-10-05: qty 6

## 2021-10-05 MED ORDER — SODIUM CHLORIDE 0.9 % IV SOLN
12.5000 mg | Freq: Three times a day (TID) | INTRAVENOUS | Status: DC | PRN
  Administered 2021-10-05: 12.5 mg via INTRAVENOUS
  Filled 2021-10-05 (×5): qty 0.5

## 2021-10-05 MED ORDER — AZITHROMYCIN 250 MG PO TABS
500.0000 mg | ORAL_TABLET | Freq: Every day | ORAL | Status: DC
  Administered 2021-10-05 – 2021-10-07 (×3): 500 mg via ORAL
  Filled 2021-10-05 (×3): qty 2

## 2021-10-05 NOTE — Plan of Care (Signed)
Problem: Education: Goal: Knowledge of General Education information will improve Description: Including pain rating scale, medication(s)/side effects and non-pharmacologic comfort measures Outcome: Progressing   Problem: Health Behavior/Discharge Planning: Goal: Ability to manage health-related needs will improve Outcome: Progressing   Problem: Clinical Measurements: Goal: Cardiovascular complication will be avoided Outcome: Progressing

## 2021-10-05 NOTE — Plan of Care (Signed)
Problem: Clinical Measurements: Goal: Respiratory complications will improve Outcome: Progressing   Problem: Activity: Goal: Risk for activity intolerance will decrease Outcome: Progressing   

## 2021-10-05 NOTE — Progress Notes (Signed)
PROGRESS NOTE  Joshua [name] KCL:[id number] DOB: Dec 07, 1935 DOA: 10/04/2021 PCP: [name], DO   LOS: 0 days   Brief Narrative / Interim history:  86 y.o. male with medical history significant of COPD with chronic hypoxic respiratory failure on 2 L at home, diastolic CHF, RV failure, pulmonary hypertension seen by CHF team as an outpatient, chronic hypotension on midodrine, prior PE on anticoagulation, chronic kidney disease stage IIIa with baseline creatinine 1.3-1.7, BPH, presents to the hospital with complaints of shortness of breath and weakness.  He has been having GI symptoms with nausea, vomiting, diarrhea for 3 days.  Over the past day prior to admission was having increased shortness of breath, cough.  He was febrile in the ED, chest x-ray showed new patchy opacities from the mid to lower left lung, suspicious for pneumonia.  He was placed on antibiotics and admitted to the hospital  Subjective / 24h Interval events: States that he feels a little bit better, has been having an increased cough since yesterday.  Assesement and Plan: Principal Problem:   Sepsis ([place]) Active Problems:   CAP (community acquired pneumonia)   History of pulmonary embolism   Chronic respiratory failure with hypoxia (HCC)   COPD (chronic obstructive pulmonary disease) (HCC)   BPH with urinary obstruction   Diastolic CHF ([place])   Pulmonary HTN (HCC)   Chronic right-sided heart failure (HCC)   Stage 3a chronic kidney disease (CKD) ([place])   Gout   Acute on chronic respiratory failure with hypoxia (HCC)   Hypokalemia   Assessment and Plan: Principal problem Sepsis due to community-acquired pneumonia-patient initially experienced GI symptoms 3 days ago and then had a lot of nausea and vomiting.  Possibly his pneumonia could have an aspiration component to it or this could all be part of viral illness.  Has been started on ceftriaxone and azithromycin, continue.  Flu, COVID, RVP all negative -Has  received limited IV fluids in the setting of sepsis, sepsis physiology improving today, afebrile, WBC improving, no longer needs IV fluids  Active problems Chronic hypotension-outpatient blood pressure in office is in the 49S systolic.  Continue midodrine.  Remains asymptomatic.   Chronic diastolic CHF, pulmonary hypertension, RV failure-seen by cardiology as an outpatient. Recent echo showed EF 55-60%, IV septum flattened, severe RV enlargement and severely decreased RV function, PASP 57 mmHg.  Torsemide held on admission, can probably resume today or tomorrow.  Heart failure team consulted, appreciate input   COPD, acute on chronic hypoxic respiratory failure -required more oxygen on admission up to 5 L, seems to be back on his home 2 L this morning.   History of PE-continue Eliquis   Chronic kidney disease stage IIIa-baseline creatinine 1.3-1.7, slightly elevated 1.9 on admission but improved to 1.4 this morning.   BPH-continue home medications   Gout-continue home medications  Hypokalemia-replete potassium today   Scheduled Meds:  allopurinol  100 mg Oral q morning   apixaban  5 mg Oral BID   azithromycin  500 mg Oral Daily   budesonide (PULMICORT) nebulizer solution  0.25 mg Nebulization BID   dapagliflozin propanediol  10 mg Oral Daily   finasteride  5 mg Oral QHS   gabapentin  300 mg Oral TID   metoCLOPramide  5 mg Oral TID AC   midodrine  15 mg Oral TID WC   multivitamin with minerals  1 tablet Oral Daily   pantoprazole  40 mg Oral q morning   Riociguat  2 mg Oral TID  Continuous Infusions:  cefTRIAXone (ROCEPHIN)  IV 2 g (10/05/21 1003)   chlorproMAZINE (THORAZINE) IV     PRN Meds:.chlorproMAZINE (THORAZINE) IV, ipratropium-albuterol  Diet Orders (From admission, onward)     Start     Ordered   10/04/21 1341  Diet Heart Room service appropriate? Yes; Fluid consistency: Thin  Diet effective now       Question Answer Comment  Room service appropriate? Yes   Fluid  consistency: Thin      10/04/21 1340            DVT prophylaxis:  apixaban (ELIQUIS) tablet 5 mg   Lab Results  Component Value Date   PLT 174 10/05/2021      Code Status: Full Code  Family Communication: no family at bedside   Status is: Inpatient  Remains inpatient appropriate because: severity of illness  Level of care: Telemetry Cardiac  Consultants:  Cardiology - CHF  Procedures:  None  Microbiology  Blood cultures 2/16-no growth to date  Antimicrobials: Ceftriaxone / Azithromycin 2/16 >>    Objective: Vitals:   10/05/21 0400 10/05/21 0717 10/05/21 0729 10/05/21 0805  BP: (!) 91/49 (!) 93/53  111/66  Pulse: 80 87  94  Resp: _0 Temp: 98.2 F (36.8 C) 98 F (36.7 C)    TempSrc: Oral Oral    SpO2: 92% 92% 91% 92%  Weight: 86 kg     Height:        Intake/Output Summary (Last 24 hours) at 10/05/2021 1028 Last data filed at 10/05/2021 0844 Gross per 24 hour  Intake 3015.37 ml  Output 1250 ml  Net 1765.37 ml   Wt Readings from Last 3 Encounters:  10/05/21 86 kg  09/11/21 87.1 kg  08/24/21 81.6 kg    Examination:  Constitutional: NAD Eyes: no scleral icterus ENMT: Mucous membranes are moist.  Neck: normal, supple Respiratory: Faint bibasilar rhonchi, no wheezing Cardiovascular: Regular rate and rhythm, no murmurs / rubs / gallops. Trace LE edema. Abdomen: non distended, no tenderness. Bowel sounds positive.  Musculoskeletal: no clubbing / cyanosis.  Skin: no rashes Neurologic: non focal    Data Reviewed: I have independently reviewed following labs and imaging studies   CBC Recent Labs  Lab 10/04/21 0920 10/04/21 1008 10/05/21 0412  WBC 14.3*  --  13.5*  HGB 11.6* 12.2* 9.8*  HCT 36.2* 36.0* 29.2*  PLT 230  --  174  MCV 90.3  --  88.0  MCH 28.9  --  29.5  MCHC 32.0  --  33.6  RDW 15.4  --  15.3  LYMPHSABS 0.5*  --   --   MONOABS 0.9  --   --   EOSABS 0.1  --   --   BASOSABS 0.0  --   --     Recent Labs  Lab  10/04/21 0919 10/04/21 0920 10/04/21 1008 10/05/21 0412  NA  --  138 138 135  K  --  4.9 3.5 3.4*  CL  --  95*  --  96*  CO2  --  33*  --  31  GLUCOSE  --  125*  --  84  BUN  --  34*  --  28*  CREATININE  --  1.92*  --  1.44*  CALCIUM  --  9.5  --  9.3  AST  --  44*  --  17  ALT  --  20  --  10  ALKPHOS  --  80  --  56  BILITOT  --  1.9*  --  0.8  ALBUMIN  --  3.3*  --  2.5*  LATICACIDVEN 2.2*  --   --   --   INR  --  1.6*  --   --     ------------------------------------------------------------------------------------------------------------------ No results for input(s): CHOL, HDL, LDLCALC, TRIG, CHOLHDL, LDLDIRECT in the last 72 hours.  No results found for: HGBA1C ------------------------------------------------------------------------------------------------------------------ No results for input(s): TSH, T4TOTAL, T3FREE, THYROIDAB in the last 72 hours.  Invalid input(s): FREET3  Cardiac Enzymes No results for input(s): CKMB, TROPONINI, MYOGLOBIN in the last 168 hours.  Invalid input(s): CK ------------------------------------------------------------------------------------------------------------------    Component Value Date/Time   BNP 124.9 (H) 09/11/2021 1030    CBG: No results for input(s): GLUCAP in the last 168 hours.  Recent Results (from the past 240 hour(s))  Resp Panel by RT-PCR (Flu A&B, Covid) Nasopharyngeal Swab     Status: None   Collection Time: 10/04/21  9:19 AM   Specimen: Nasopharyngeal Swab; Nasopharyngeal(NP) swabs in vial transport medium  Result Value Ref Range Status   SARS Coronavirus 2 by RT PCR NEGATIVE NEGATIVE Final    Comment: (NOTE) SARS-CoV-2 target nucleic acids are NOT DETECTED.  The SARS-CoV-2 RNA is generally detectable in upper respiratory specimens during the acute phase of infection. The lowest concentration of SARS-CoV-2 viral copies this assay can detect is 138 copies/mL. A negative result does not preclude  SARS-Cov-2 infection and should not be used as the sole basis for treatment or other patient management decisions. A negative result may occur with  improper specimen collection/handling, submission of specimen other than nasopharyngeal swab, presence of viral mutation(s) within the areas targeted by this assay, and inadequate number of viral copies(<138 copies/mL). A negative result must be combined with clinical observations, patient history, and epidemiological information. The expected result is Negative.  Fact Sheet for Patients:  EntrepreneurPulse.com.au  Fact Sheet for Healthcare Providers:  IncredibleEmployment.be  This test is no t yet approved or cleared by the [place] FDA and  has been authorized for detection and/or diagnosis of SARS-CoV-2 by FDA under an Emergency Use Authorization (EUA). This EUA will remain  in effect (meaning this test can be used) for the duration of the COVID-19 declaration under Section 564(b)(1) of the Act, 21 U.S.C.section 360bbb-3(b)(1), unless the authorization is terminated  or revoked sooner.       Influenza A by PCR NEGATIVE NEGATIVE Final   Influenza B by PCR NEGATIVE NEGATIVE Final    Comment: (NOTE) The Xpert Xpress SARS-CoV-2/FLU/RSV plus assay is intended as an aid in the diagnosis of influenza from Nasopharyngeal swab specimens and should not be used as a sole basis for treatment. Nasal washings and aspirates are unacceptable for Xpert Xpress SARS-CoV-2/FLU/RSV testing.  Fact Sheet for Patients: EntrepreneurPulse.com.au  Fact Sheet for Healthcare Providers: IncredibleEmployment.be  This test is not yet approved or cleared by the [place] FDA and has been authorized for detection and/or diagnosis of SARS-CoV-2 by FDA under an Emergency Use Authorization (EUA). This EUA will remain in effect (meaning this test can be used) for the duration of  the COVID-19 declaration under Section 564(b)(1) of the Act, 21 U.S.C. section 360bbb-3(b)(1), unless the authorization is terminated or revoked.  Performed at [facility], [place] [address] [place], [place] [address]   Blood Culture (routine x 2)     Status: None (Preliminary result)   Collection Time: 10/04/21  9:20 AM   Specimen: BLOOD LEFT WRIST  Result  Value Ref Range Status   Specimen Description BLOOD LEFT WRIST  Final   Special Requests   Final    BOTTLES DRAWN AEROBIC AND ANAEROBIC Blood Culture results may not be optimal due to an inadequate volume of blood received in culture bottles   Culture   Final    NO GROWTH < 24 HOURS Performed at [facility] [address] [place], [place] [address]    Report Status PENDING  Incomplete  Blood Culture (routine x 2)     Status: None (Preliminary result)   Collection Time: 10/04/21  9:25 AM   Specimen: BLOOD RIGHT FOREARM  Result Value Ref Range Status   Specimen Description BLOOD RIGHT FOREARM  Final   Special Requests   Final    BOTTLES DRAWN AEROBIC AND ANAEROBIC Blood Culture results may not be optimal due to an inadequate volume of blood received in culture bottles   Culture   Final    NO GROWTH < 24 HOURS Performed at [facility], [place] [address] [place], [place] [address]    Report Status PENDING  Incomplete  Respiratory (~20 pathogens) panel by PCR     Status: None   Collection Time: 10/04/21 12:24 PM   Specimen: Nasopharyngeal Swab; Respiratory  Result Value Ref Range Status   Adenovirus NOT DETECTED NOT DETECTED Final   Coronavirus 229E NOT DETECTED NOT DETECTED Final    Comment: (NOTE) The Coronavirus on the Respiratory Panel, DOES NOT test for the novel  Coronavirus (2019 nCoV)    Coronavirus HKU1 NOT DETECTED NOT DETECTED Final   Coronavirus NL63 NOT DETECTED NOT DETECTED Final   Coronavirus OC43 NOT DETECTED NOT DETECTED Final   Metapneumovirus NOT DETECTED NOT DETECTED Final    Rhinovirus / Enterovirus NOT DETECTED NOT DETECTED Final   Influenza A NOT DETECTED NOT DETECTED Final   Influenza B NOT DETECTED NOT DETECTED Final   Parainfluenza Virus 1 NOT DETECTED NOT DETECTED Final   Parainfluenza Virus 2 NOT DETECTED NOT DETECTED Final   Parainfluenza Virus 3 NOT DETECTED NOT DETECTED Final   Parainfluenza Virus 4 NOT DETECTED NOT DETECTED Final   Respiratory Syncytial Virus NOT DETECTED NOT DETECTED Final   Bordetella pertussis NOT DETECTED NOT DETECTED Final   Bordetella Parapertussis NOT DETECTED NOT DETECTED Final   Chlamydophila pneumoniae NOT DETECTED NOT DETECTED Final   Mycoplasma pneumoniae NOT DETECTED NOT DETECTED Final    Comment: Performed at [facility], [place]. [address] [place], [place] [address]     Radiology Studies: No results found.   Marzetta Board, MD, PhD [facility]  Between 7 am - 7 pm I am available, please contact me via Amion (for emergencies) or Securechat (non urgent messages)  Between 7 pm - 7 am I am not available, please contact night coverage MD/APP via Amion

## 2021-10-05 NOTE — Progress Notes (Addendum)
Advanced Heart Failure Team Consult Note   Primary Physician: Joshua Morning, DO PCP-Cardiologist:  Joshua Mocha, MD  Reason for Consultation: Chronic diastolic CHF with RV failure  HPI:    Joshua [name] is seen today for evaluation of chronic diastolic CHF with RV failure at the request of Dr. Renne Crigler.    86 y.o. male with history of HFPEF, prior PE, and pulmonary hypertension.  Patient had a PE in 2016.  He has a long history of diastolic CHF. Echo showed a significant component of RV failure with EF 55-60%, IV septum flattened, severe RV enlargement, severely decreased RV function, PASP 57 mmHg.  CTA chest in 4/22 showed no acute PE and mild emphysema, but V/Q scan in 5/22 was suggestive of chronic PE in the right middle lobe.  RHC in 4/22 showed normal filling pressures with moderate PAH. He additionally has chronic hiccups followed by Dr. Benson Norway, now on gabapentin. PFTs in 6/22 showed mixed picture with severe obstruction, moderate restriction, and severely decreased DLCO.    Admitted 8/25-8/30/22 with A/C HF exacerbation. He was aggressively diuresed with lasix/metolazone, farxiga added.  Midodrine was increased for hypotension. Hospitalization c/b AKI on CKD III.   Readmitted 9/1-9/12/22 with shock (mixed septic and cardiogenic), likely 2/2 to aspiration pneumonia and A/C CHF.  Suspect possible gut translocation with ileus/partial SBO; CT abdomen with "nutcracker phenomenon".  He was started on NE + antibiotics. General surgery consulted and felt not to be a surgical candidate. Diuretics initially held due to over-diuresis and shock. Eventually able to wean pressors off, restart PO torsemide and midodrine. Hospitalization c/b transaminitis, delirium, and BPH with urinary retention, requiring foley catheter. Palliative care was consulted for [place] and patient DNR/DNI.    EGD 11/22 showed esophagitis, hiatal hernia and yeast.    Acute visit 07/17/21 for weakness/falls. He had a syncopal  episode in clinic. Labs showed hypokalemia of 3.3, SCr elevated at 2.13, ReDs 27%. [place] Glade stopped, torsemide decreased to 40 mg daily and compression hose and abdominal binder ordered. 3-day Zio placed to evaluate for possible arrhythmogenic cause of symptoms.  Zio monitor showed 19 second run of VT.   Last seen for f/u 09/11/21. Volume appeared stable. Maintained on 40 mg Torsemide daily.   Presented to ED yesterday with vomiting and diarrhea for several days. Poor po intake. When EMS arrived O2 69% on 2L.  Sinus tachycardia noted and T 101F. Initially required NRB.  Scr 1.92, WBC 14,000, Lactic acid 2.2, Chest x-ray demonstrated left-sided infiltrate. Started on ceftriaxone + azithromycin d/ tconcern for CAP. Concern for sepsis. Given 1L fluid bolus. He was admitted to hospitalist service for management of acute on chronic respiratory failure and sepsis secondary to CAP.  Feeling better today. Vomiting and diarrhea resolved. Appetite better. Denies dyspnea. Reports he ambulated around his room.   Review of Systems: [y] = yes, _0  = no   General: Weight gain _1 ; Weight loss [Y]; Anorexia [Y]; Fatigue _2 ; Fever [Y]; Chills _3 ; Weakness _4   Cardiac: Chest pain/pressure _5 ; Resting SOB _6 ; Exertional SOB _7 ; Orthopnea _8 ; Pedal Edema _9 ; Palpitations _10 ; Syncope _11 ; Presyncope _12 ; Paroxysmal nocturnal dyspnea_13   Pulmonary: Cough _14 ; Wheezing_15 ; Hemoptysis_16 ; Sputum _17 ; Snoring _18   GI: Vomiting[Y]; Dysphagia_19 ; Melena_20 ; Hematochezia _21 ; Heartburn_22 ; Abdominal pain _23 ; Constipation _24 ; Diarrhea [Y]; BRBPR _25   GU: Hematuria_26 ; Dysuria _27 ; Nocturia_28   Vascular: Pain in legs with walking _0 ; Pain in feet with lying flat _1 ; Non-healing sores _2 ; Stroke _3 ; TIA _4 ; Slurred speech _5 ;  Neuro: Headaches_6 ; Vertigo_7 ; Seizures_8 ; Paresthesias_9 ;Blurred vision _10 ; Diplopia _11 ; Vision changes _12   Ortho/Skin: Arthritis _13 ; Joint pain _14 ; Muscle pain _15 ; Joint swelling  _16 ; Back Pain _17 ; Rash _18   Psych: Depression_19 ; Anxiety_20   Heme: Bleeding problems _21 ; Clotting disorders _22 ; Anemia _23   Endocrine: Diabetes _24 ; Thyroid dysfunction_25   Home Medications Prior to Admission medications   Medication Sig Start Date End Date Taking? Authorizing Provider  albuterol (VENTOLIN HFA) 108 (90 Base) MCG/ACT inhaler Inhale 1 puff into the lungs every 4 (four) hours as needed for shortness of breath.   Yes [provider]  allopurinol (ZYLOPRIM) 100 MG tablet Take 100 mg by mouth every [name].   Yes [provider]  apixaban (ELIQUIS) 5 MG TABS tablet Take 1 tablet (5 mg total) by mouth 2 (two) times daily. 02/28/21  Yes Larey Dresser, MD  Azelastine HCl 0.15 % SOLN Place 2 sprays into the nose daily as needed (allergies). 02/22/21  Yes [provider]  Cinnamon 500 MG capsule Take 500 mg by mouth every [name].   Yes [provider]  Ensure (ENSURE) Take 237 mLs by mouth daily.   Yes [provider]  FARXIGA 10 MG TABS tablet Take 10 mg by mouth daily. 07/20/21  Yes [provider]  finasteride (PROSCAR) 5 MG tablet Take 5 mg by mouth at bedtime.   Yes [provider]  gabapentin (NEURONTIN) 100 MG capsule Take 300 mg by mouth 3 (three) times daily. 11/27/20  Yes [provider]  magnesium oxide (MAG-OX) 400 MG tablet Take 400 mg by mouth daily.   Yes [provider]  metoCLOPramide (REGLAN) 5 MG tablet Take 5 mg by mouth 3 (three) times daily before meals.   Yes [provider]  midodrine (PROAMATINE) 5 MG tablet Take 3 tablets (15 mg total) by mouth 3 (three) times daily with meals. 06/18/21  Yes Larey Dresser, MD  Multiple Vitamin (MULTIVITAMIN WITH MINERALS) TABS tablet Take 1 tablet by mouth daily. 05/01/21  Yes Sheikh, Omair Latif, DO  OXYGEN Inhale 2 L into the lungs continuous.   Yes [provider]  pantoprazole (PROTONIX) 40 MG tablet Take 40 mg by mouth  every [name].   Yes [provider]  potassium chloride (KLOR-CON) 10 MEQ tablet TAKE 2 TABLETS BY MOUTH 2 TIMES DAILY. Patient taking differently: Take 20 mEq by mouth 2 (two) times daily. 09/13/21  Yes Larey Dresser, MD  Riociguat (ADEMPAS) 1.5 MG TABS Take 2 mg by mouth in the [name], at noon, and at bedtime. 09/11/21  Yes Larey Dresser, MD  Tiotropium Bromide-Olodaterol 2.5-2.5 MCG/ACT AERS Inhale 2 puffs into the lungs daily. 05/30/21  Yes [name], MD  torsemide (DEMADEX) 20 MG tablet Take 2 tablets (40 mg total) by mouth daily. 07/17/21 10/15/21 Yes Milford, Maricela Bo, FNP  ondansetron (ZOFRAN) 4 MG tablet Take 1 tablet (4 mg total) by mouth every 6 (six) hours as needed for nausea. Patient not taking: Reported on 10/04/2021 04/30/21   Kerney Elbe, DO    Past Medical History: Past Medical History:  Diagnosis Date   Arthritis    BPH (benign prostatic hyperplasia)    COPD (chronic obstructive pulmonary  disease) ([place])    Folliculitis    posterior scalp per office visit note of Dr Maudie Mercury 07/20/2014     GERD (gastroesophageal reflux disease)    Hypertension    PE (pulmonary thromboembolism) ([place])    Phlebitis    right arm  at least 20 years ago    Pneumonia    hx of pneumonia as a child    Sleep apnea     Past Surgical History: Past Surgical History:  Procedure Laterality Date   BIOPSY  11/03/2020   Procedure: BIOPSY;  Surgeon: Carol Ada, MD;  Location: [facility];  Service: Endoscopy;;   BIOPSY  07/06/2021   Procedure: BIOPSY;  Surgeon: Carol Ada, MD;  Location: [facility];  Service: Endoscopy;;   bone removed from little toe right foot      CHOLECYSTECTOMY     ESOPHAGOGASTRODUODENOSCOPY (EGD) WITH PROPOFOL N/A 11/03/2020   Procedure: ESOPHAGOGASTRODUODENOSCOPY (EGD) WITH PROPOFOL;  Surgeon: Carol Ada, MD;  Location: [facility];  Service: Endoscopy;  Laterality: N/A;   ESOPHAGOGASTRODUODENOSCOPY (EGD) WITH PROPOFOL N/A 07/06/2021    Procedure: ESOPHAGOGASTRODUODENOSCOPY (EGD) WITH PROPOFOL;  Surgeon: Carol Ada, MD;  Location: [facility];  Service: Endoscopy;  Laterality: N/A;   pilonidal cyst removal      RIGHT HEART CATH N/A 12/13/2020   Procedure: RIGHT HEART CATH;  Surgeon: Larey Dresser, MD;  Location: [facility];  Service: Cardiovascular;  Laterality: N/A;   TOTAL KNEE ARTHROPLASTY Left 09/13/2014   Procedure: LEFT TOTAL KNEE ARTHROPLASTY;  Surgeon: Mauri Pole, MD;  Location: [facility];  Service: Orthopedics;  Laterality: Left;    Family History: Family History  Problem Relation Age of Onset   Heart attack Brother 61    Social History: Social History   Socioeconomic History   Marital status: Married    Spouse name: Joann   Number of children: Not on file   Years of education: Not on file   Highest education level: Not on file  Occupational History   Not on file  Tobacco Use   Smoking status: Former    Packs/day: 1.00    Years: 50.00    Pack years: 50.00    Types: Cigarettes    Start date: 10    Quit date: 08/19/2006    Years since quitting: 15.1   Smokeless tobacco: Never  Vaping Use   Vaping Use: Never used  Substance and Sexual Activity   Alcohol use: Not Currently    Alcohol/week: 6.0 standard drinks    Types: 6 Shots of liquor per week   Drug use: No   Sexual activity: Not on file  Other Topics Concern   Not on file  Social History Narrative   Not on file   Social Determinants of Health   Financial Resource Strain: Not on file  Food Insecurity: No Food Insecurity   Worried About Running Out of Food in the Last Year: Never true   [place] in the Last Year: Never true  Transportation Needs: No Transportation Needs   Lack of Transportation (Medical): No   Lack of Transportation (Non-Medical): No  Physical Activity: Not on file  Stress: No Stress Concern Present   Feeling of Stress : Only a little  Social Connections: Not on file    Allergies:  Allergies   Allergen Reactions   Other Swelling    Beer- Swelling    Sunflower Oil Swelling   Sulfa Antibiotics Rash    Objective:    Vital Signs:  Temp:  [98 F (36.7 C)-98.9 F (37.2 C)] 98 F (36.7 C) (02/17 0717) Pulse Rate:  [78-98] 94 (02/17 0805) Resp:  [11-20] 15 (02/17 0805) BP: (83-111)/(39-66) 111/66 (02/17 0805) SpO2:  [89 %-98 %] 92 % (02/17 0805) Weight:  [85 kg-86 kg] 86 kg (02/17 0400) Last BM Date : 10/04/21  Weight change: Filed Weights   10/04/21 0917 10/04/21 1546 10/05/21 0400  Weight: 81.6 kg 85 kg 86 kg    Intake/Output:   Intake/Output Summary (Last 24 hours) at 10/05/2021 1132 Last data filed at 10/05/2021 0844 Gross per 24 hour  Intake 3015.37 ml  Output 1250 ml  Net 1765.37 ml      Physical Exam    General:  No distress. Sitting up in char HEENT: normal Neck: supple. JVP 10 cm. Carotids 2+ bilat; no bruits.  Cor: PMI nondisplaced. Regular rate & rhythm. No rubs, gallops or murmurs. Lungs: decreased breath sounds left base Abdomen: soft, nontender, nondistended. No hepatosplenomegaly.  Extremities: no cyanosis, clubbing, rash, 1+ edema, + TED hose Neuro: alert & orientedx3, cranial nerves grossly intact. moves all 4 extremities w/o difficulty. Affect pleasant   Telemetry   SR/Sinus tach, 80s-100s  EKG    Sinus tach 131 bpm, QTC 629 ms  Labs   Basic Metabolic Panel: Recent Labs  Lab 10/04/21 0920 10/04/21 1008 10/05/21 0412  NA 138 138 135  K 4.9 3.5 3.4*  CL 95*  --  96*  CO2 33*  --  31  GLUCOSE 125*  --  84  BUN 34*  --  28*  CREATININE 1.92*  --  1.44*  CALCIUM 9.5  --  9.3    Liver Function Tests: Recent Labs  Lab 10/04/21 0920 10/05/21 0412  AST 44* 17  ALT 20 10  ALKPHOS 80 56  BILITOT 1.9* 0.8  PROT 7.0 5.6*  ALBUMIN 3.3* 2.5*   No results for input(s): LIPASE, AMYLASE in the last 168 hours. No results for input(s): AMMONIA in the last 168 hours.  CBC: Recent Labs  Lab 10/04/21 0920 10/04/21 1008  10/05/21 0412  WBC 14.3*  --  13.5*  NEUTROABS 12.6*  --   --   HGB 11.6* 12.2* 9.8*  HCT 36.2* 36.0* 29.2*  MCV 90.3  --  88.0  PLT 230  --  174    Cardiac Enzymes: No results for input(s): CKTOTAL, CKMB, CKMBINDEX, TROPONINI in the last 168 hours.  BNP: BNP (last 3 results) Recent Labs    04/22/21 0958 06/14/21 0955 09/11/21 1030  BNP 1,156.1* 389.4* 124.9*    ProBNP (last 3 results) No results for input(s): PROBNP in the last 8760 hours.   CBG: No results for input(s): GLUCAP in the last 168 hours.  Coagulation Studies: Recent Labs    10/04/21 0920  LABPROT 18.9*  INR 1.6*     Imaging   No results found.   Medications:     Current Medications:  allopurinol  100 mg Oral q [name]   apixaban  5 mg Oral BID   azithromycin  500 mg Oral Daily   budesonide (PULMICORT) nebulizer solution  0.25 mg Nebulization BID   dapagliflozin propanediol  10 mg Oral Daily   finasteride  5 mg Oral QHS   gabapentin  300 mg Oral TID   metoCLOPramide  5 mg Oral TID AC   midodrine  15 mg Oral TID WC   multivitamin with minerals  1 tablet Oral Daily   pantoprazole  40 mg Oral q [name]  Riociguat  2 mg Oral TID    Infusions:  cefTRIAXone (ROCEPHIN)  IV 2 g (10/05/21 1003)   chlorproMAZINE (THORAZINE) IV        Patient Profile   86 y.o. male with history of chronic diastolic CHF with RV failure, pulmonary HTN, chronic respitoray failure, hx PE, CKD III. Admitted with acute on chronic respiratory failure with hypoxia and sepsis 2/2 pneumonia.  Assessment/Plan   1. Chronic HFpEF/RV failure: Echo (2/22) with EF 55-60%, IV septum flattened, severe RV enlargement, severely decreased RV function, PASP 57 mmHg.  He has severe RV failure.   - Appears slightly volume up. Home torsemide held on admit d/t volume depletion with vomiting diarrhea. Received IV fluid resuscitation. Will restart home Torsemide. - Continue midodrine 15 mg tid. Would avoid fludrocortisone. - Off  farxiga d/t yeast infections - Needs to wear compression hose + abdominal binder every day. 2. Pulmonary HTN: PAH noted on 4/22 RHC with PVR 6.1 WU.  This appears to be multifactorial with OSA, severe emphysema, and a suspected chronic PE involving the right middle lobe (group 3 and group 4 PH). Given the suspected mixed etiology with only 1 area of chronic thromboembolism (right middle lobe) as well as age, do not think that pulmonary thromboendarterectomy would be indicated.  Rheumatologic serologic workup was negative.  PFTs showed severe obstruction and moderate restriction, suggesting significant COPD.  - Continue riociguat for CTEPH, on 2 mg tid.  - Continue 2L home oxygen at rest and 4L with exertion.  - Currently on 4L here in setting of PNA 3. Acute on chronic respiratory failure with hypoxia/PNA - Secondary to CAP. On ceftriaxone and azithromycin. - May need to consider alternative to azithromycin. Qtc > 600 ms on ECG yesterday. Repeat ECG. 4. Stage III CKD: Scr 1.92>1.44 5. OSA: Moderate OSA on sleep study, waiting for CPAP.  6. Emphysema: Prior smoker.  Emphysema on CT and severe obstruction on PFTs - He uses home oxygen as above.   7. Chronic PE: Diagnosed by V/Q scan.  No abnormal bleeding.  - Continue Eliquis.  8. Hiatial hernia w/ esophagitis:  - Continue PPI. - Off Farxiga with yeast. 9. Hypokalemia: - K 3.4. Supp.  Length of Stay: 0  FINCH, LINDSAY N, PA-C  10/05/2021, 11:32 AM  Advanced Heart Failure Team Pager [phone] (M-F; 7a - 5p)  Please contact [facility] for night-coverage after hours (4p -7a ) and weekends on [email or web address]   Patient seen with PA, agree with the above note.   He was admitted with fever and suspected PNA, diuretics held with fluid resuscitation. CXR with right-sided PNA.  Known severe RV failure, multifactorial due to CTEPH, COPD.   He is feeling better, on ceftriaxone/azithromycin.   General: NAD Neck: JVP 10-12 cm, no thyromegaly or  thyroid nodule.  Lungs: Distant BS CV: Nondisplaced PMI.  Heart regular S1/S2, no S3/S4, no murmur.  No peripheral edema.  No carotid bruit.  Normal pedal pulses.  Abdomen: Soft, nontender, no hepatosplenomegaly, no distention.  Skin: Intact without lesions or rashes.  Neurologic: Alert and oriented x 3.  Psych: Normal affect. Extremities: No clubbing or cyanosis.  HEENT: Normal.   Mild volume overload, known RV failure.  Creatnine down to 1.44, this is baseline.  - Lasix 60 mg IV x 1 today, then start torsemide 40 mg daily tomorrow.  - Continue Adempas  BP stable on midodrine.    QTc long on prior ECG but questionable measurement, would repeat ECG today.  If significantly  prolonged, would stop azithromycin.   Loralie Champagne 10/05/2021 1:35 PM

## 2021-10-05 NOTE — Evaluation (Signed)
Physical Therapy Evaluation Patient Details Name: Joshua [name] MRN: [id number] DOB: Oct 19, 1935 Today's Date: 10/05/2021  History of Present Illness  The pt is an 86 yo male presenting from home on 2/16 with c/o generalized weakness, SOB, and n/v/d. Pt hypoxic to 68% upon arrival, chest imaging suggestive of PNA. PMH includes: COPD on 2L O2 at baseline, CHF, RV failure, pulmonary HTN, CKD III, PE, BPH.   Clinical Impression  Pt in bed upon arrival of PT, agreeable to evaluation at this time. Prior to admission the pt was ambulating independently in the home, completing a flight of stairs multiple times each day without assist, and using 2L O2. The pt now presents with limitations in functional mobility, power, endurance, and dynamic stability due to above dx, and will continue to benefit from skilled PT to address these deficits. The pt was able to complete multiple sit-stand transfers this session, but is unable to power up to standing without use of UE. The pt was able to walk 30 ft with 6L and SpO2 87-91%; then additional 30 ft on 4L and SpO2 again 87-91%. Low of 84% during seated recovery, but increased to 87% quickly with seated rest. The pt denies SOB with any activity, states he feels at his mobility baseline despite additional O2 needs. The pt dropped to SpO2 low of 84% on 4L with 30 sec sit-stand and recovered within 1-2 min seated rest on 4L. Pt left on 5L at end of session with SpO2 > 88%. Will continue to benefit from skilled PT to progress endurance and dynamic stability, will be safe to return home with family assist.   5X Sit-to-Stand: 16 sec (> 14.8 sec indicates increased risk of falls for individuals aged 54-89, > 15 sec indicates increased risk of recurrent falls)   30 Second Sit-Stand: The patient completed 9 sit-stands in a 30 second period. ( < 11.9 for individuals aged 38 - 54 indicates below average activity tolerance and increased risk of falls)      Recommendations for  follow up therapy are one component of a multi-disciplinary discharge planning process, led by the attending physician.  Recommendations may be updated based on patient status, additional functional criteria and insurance authorization.  Follow Up Recommendations Home health PT    Assistance Recommended at Discharge Intermittent Supervision/Assistance  Patient can return home with the following  A little help with walking and/or transfers;A little help with bathing/dressing/bathroom;Assistance with cooking/housework;Help with stairs or ramp for entrance    Equipment Recommendations None recommended by PT  Recommendations for Other Services       Functional Status Assessment Patient has had a recent decline in their functional status and demonstrates the ability to make significant improvements in function in a reasonable and predictable amount of time.     Precautions / Restrictions Precautions Precautions: Fall Precaution Comments: pt reports one fall last nov, wat SpO2. on 4-6L for mobility Restrictions Weight Bearing Restrictions: No      Mobility  Bed Mobility Overal bed mobility: Modified Independent             General bed mobility comments: pt using bed rails and slight HOB elevated but no assist    Transfers Overall transfer level: Needs assistance Equipment used: None Transfers: Sit to/from Stand Sit to Stand: Supervision           General transfer comment: supervision for safety, no assist given. The pt was able to complete x10 in session, unable to stand without use of UE or  minA. 5x sit-stand in 16 sec and 9 in 30 sec    Ambulation/Gait Ambulation/Gait assistance: Min guard Gait Distance (Feet): 30 Feet (+ 30 ft + 100 ft) Assistive device: None Gait Pattern/deviations: Step-through pattern, Decreased stride length Gait velocity: decreased but pt reports normal Gait velocity interpretation: <1.31 ft/sec, indicative of household ambulator   General  Gait Details: pt with mild instability, minG for safety. able to complete 30 ft with 6L and SpO2 87-91%; then 30 additional ft on 4L and SpO2 again 87-91%. low of 84 during seated recovery, but increased to 87% quickly with seated rest.     Balance Overall balance assessment: Mild deficits observed, not formally tested, History of Falls                                           Pertinent Vitals/Pain Pain Assessment Pain Assessment: No/denies pain    Home Living Family/patient expects to be discharged to:: Private residence Living Arrangements: Spouse/significant other Available Help at Discharge: Family;Available 24 hours/day Type of Home: House Home Access: Stairs to enter Entrance Stairs-Rails: Right Entrance Stairs-Number of Steps: 5 Alternate Level Stairs-Number of Steps: 6 + landing with seat + Home Layout: Two level;Bed/bath upstairs Home Equipment: Rolling Walker (2 wheels);Cane - single point;BSC/3in1;Shower seat Additional Comments: pt reports not using shower chair or DME. has portable O2.    Prior Function Prior Level of Function : Independent/Modified Independent;History of Falls (last six months)             Mobility Comments: pt reports independence with mobility, will take RW when going out in community. The pt reports he does stairs 2-3x daily (for breakfast, and dinner) but spends most of day upstairs ADLs Comments: pt reports independence     Hand Dominance   Dominant Hand: Right    Extremity/Trunk Assessment   Upper Extremity Assessment Upper Extremity Assessment: Overall WFL for tasks assessed    Lower Extremity Assessment Lower Extremity Assessment: Generalized weakness (poor functional power, no buckling or changes in sensation)    Cervical / Trunk Assessment Cervical / Trunk Assessment: Normal  Communication   Communication: No difficulties  Cognition Arousal/Alertness: Awake/alert Behavior During Therapy: WFL for tasks  assessed/performed Overall Cognitive Status: Within Functional Limits for tasks assessed                                          General Comments General comments (skin integrity, edema, etc.): 30 ft with 6L and SpO2 87-91%; then 30 additional ft on 4L and SpO2 again 87-91%. low of 84 during seated recovery, but increased to 87% quickly with seated rest. pt left on 5L. pt also coughed up blood in sputum and RN alerted    Exercises Other Exercises Other Exercises: 5x sit-stand from recliner in 16 sec Other Exercises: 9 sit-stand in 30 sec with minG and use of UE   Assessment/Plan    PT Assessment Patient needs continued PT services  PT Problem List Decreased strength;Decreased balance;Decreased activity tolerance;Decreased mobility;Cardiopulmonary status limiting activity       PT Treatment Interventions DME instruction;Stair training;Gait training;Functional mobility training;Therapeutic activities;Therapeutic exercise;Balance training;Patient/family education    PT Goals (Current goals can be found in the Care Plan section)  Acute Rehab PT Goals Patient Stated Goal: return home PT Goal  Formulation: With patient Time For Goal Achievement: 10/19/21 Potential to Achieve Goals: Good    Frequency Min 3X/week        AM-PAC PT "6 Clicks" Mobility  Outcome Measure Help needed turning from your back to your side while in a flat bed without using bedrails?: None Help needed moving from lying on your back to sitting on the side of a flat bed without using bedrails?: None Help needed moving to and from a bed to a chair (including a wheelchair)?: A Little Help needed standing up from a chair using your arms (e.g., wheelchair or bedside chair)?: A Little Help needed to walk in hospital room?: A Little Help needed climbing 3-5 steps with a railing? : A Little 6 Click Score: 20    End of Session Equipment Utilized During Treatment: Gait belt;Oxygen Activity Tolerance:  Patient tolerated treatment well Patient left: in chair;with call bell/phone within reach;with chair alarm set Nurse Communication: Mobility status PT Visit Diagnosis: Other abnormalities of gait and mobility (R26.89);Muscle weakness (generalized) (M62.81)    Time: 4627-0350 PT Time Calculation (min) (ACUTE ONLY): 42 min   Charges:   PT Evaluation $PT Eval Low Complexity: 1 Low PT Treatments $Therapeutic Exercise: 23-37 mins        [place] Carbo, PT, DPT   Acute Rehabilitation Department Pager #: [phone]  Sandra Cockayne 10/05/2021, 9:07 AM

## 2021-10-05 NOTE — Telephone Encounter (Signed)
Pt wife called to notify staff that pt has been admitted to [facility] on 3e, Please advise

## 2021-10-05 NOTE — Patient Outreach (Signed)
[facility]) Care Management  10/05/2021  Joshua [name] 1935-11-26 [id number]   [facility] Case closure   Unsuccessful outreach on 07/19/21, 128/22 08/01/21 Unsuccessful outreach letter sent on 07/26/21  without a response   Plan [facility] [address] [address] RN CM will close case after no response from patient within 10+ business days. Unable to maintain contact Case closure letters sent to patient and MD  Joelene Millin L. Lavina Hamman, RN, BSN, [place] Coordinator Office number [phone] Mobile number [phone]  Main [facility] number [phone] Fax number [phone]

## 2021-10-05 NOTE — TOC Initial Note (Addendum)
Transition of Care [facility]) - Initial/Assessment Note    Patient Details  Name: Joshua [name] MRN: [id number] Date of Birth: 1936/02/13  [facility] CM/SW Contact:    Zenon Mayo, RN Phone Number: 10/05/2021, 3:20 PM  Clinical Narrative:                 NCM spoke with patient and wife and daughter, offered choice for HHPT, they chose [facility].  NCM made referral to [facility] for [place] to hear back.  Corene Cornea states he can take referral.  Soc will begin 24 to 48 hrs post dc.    Expected Discharge Plan: [place] Barriers to Discharge: Continued Medical Work up   Patient Goals and CMS Choice Patient states their goals for this hospitalization and ongoing recovery are:: return home with wife CMS Medicare.gov Compare Post Acute Care list provided to:: Patient Choice offered to / list presented to : Patient  Expected Discharge Plan and Services Expected Discharge Plan: [place]   Discharge Planning Services: CM Consult Post Acute Care Choice: [place] arrangements for the past 2 months: Single Family Home                   DME Agency: NA       HH Arranged: PT HH Agency: [place] ([place]) Date HH Agency Contacted: 10/05/21 Time [place]: [id number] Representative spoke with at [place]: Corene Cornea  Prior Living Arrangements/Services Living arrangements for the past 2 months: [place] Lives with:: Spouse Patient language and need for interpreter reviewed:: Yes Do you feel safe going back to the place where you live?: Yes      Need for Family Participation in Patient Care: Yes (Comment) Care giver support system in place?: Yes (comment) Current home services: DME (cane) Criminal Activity/Legal Involvement Pertinent to Current Situation/Hospitalization: No - Comment as needed  Activities of Daily Living Home Assistive Devices/Equipment: Cane (specify quad or straight) ADL Screening  (condition at time of admission) Patient's cognitive ability adequate to safely complete daily activities?: Yes Is the patient deaf or have difficulty hearing?: No Does the patient have difficulty seeing, even when wearing glasses/contacts?: No Does the patient have difficulty concentrating, remembering, or making decisions?: No Patient able to express need for assistance with ADLs?: Yes Does the patient have difficulty dressing or bathing?: No Independently performs ADLs?: Yes (appropriate for developmental age) Does the patient have difficulty walking or climbing stairs?: Yes Weakness of Legs: Both Weakness of Arms/Hands: Both  Permission Sought/Granted                  Emotional Assessment   Attitude/Demeanor/Rapport: Engaged Affect (typically observed): Appropriate Orientation: : Oriented to Self, Oriented to Place, Oriented to  Time, Oriented to Situation Alcohol / Substance Use: Not Applicable Psych Involvement: No (comment)  Admission diagnosis:  CAP (community acquired pneumonia) [J18.9] Acute on chronic respiratory failure with hypoxia ([place]) [J96.21] Community acquired pneumonia of left lung, unspecified part of lung [J18.9] Sepsis without acute organ dysfunction, due to unspecified organism [facility]) [A41.9] Patient Active Problem List   Diagnosis Date Noted   Hypokalemia 10/05/2021   CAP (community acquired pneumonia) 10/04/2021   Stage 3a chronic kidney disease (CKD) ([place]) 10/04/2021   Gout 10/04/2021   Acute on chronic respiratory failure with hypoxia ([place]) 10/04/2021   Acute renal failure superimposed on stage 3 chronic kidney disease ([place]) 04/19/2021   Intractable nausea and vomiting 04/19/2021  Hyperkalemia 04/19/2021   Transaminitis 04/19/2021   Elevated troponin 04/19/2021   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   Acute on chronic diastolic (congestive) heart failure ([place]) 04/12/2021   Acute on chronic right-sided heart failure ([place]) 04/12/2021   Acute kidney  injury superimposed on CKD ([place]) 04/12/2021   OSA (obstructive sleep apnea) 12/07/2020   Aortic atherosclerosis ([place]) 12/07/2020   CHF (congestive heart failure) ([place]) 12/03/2020   Syncope and collapse 12/03/2020   Chronic right-sided heart failure ([place]) 12/03/2020   Pulmonary HTN ([place]) 09/29/2017   BPH with urinary obstruction 08/25/2017   Syncope, vasovagal 08/25/2017   Demand ischemia of myocardium ([place]) 30/02/6225   Diastolic CHF ([place]) 33/35/4562   Sepsis ([place]) 08/24/2017   Essential hypertension 02/16/2015   COPD (chronic obstructive pulmonary disease) ([place]) 12/12/2014   History of pulmonary embolism 10/10/2014   Chronic respiratory failure with hypoxia ([place]) 10/10/2014   S/P knee replacement 09/13/2014   PCP:  [name], DO Pharmacy:   [id number]Phone: [phone]: [phone]    Social Determinants of Health (SDOH) Interventions    Readmission Risk Interventions Readmission Risk Prevention Plan 10/05/2021  Transportation Screening Complete  Medication Review (RN Care Manager) Complete  PCP or Specialist appointment within 3-5 days of discharge Complete  HRI or [place]  SW Recovery Care/Counseling Consult Complete  Palliative Care Screening Not AMonseyNot Applicable  Some recent data might be hidden

## 2021-10-05 NOTE — Progress Notes (Signed)
Patient's daughter and son in law requested to see CHF team - Dr. Aundra Dubin during the patient's stay. Patient's daughter relayed that the patient is not taking farxiga per Dr. Aundra Dubin. Farxiga held. G[place] MD made aware. Cardiology to be consulted.

## 2021-10-06 DIAGNOSIS — Z515 Encounter for palliative care: Secondary | ICD-10-CM

## 2021-10-06 LAB — CBC
HCT: 32.6 % — ABNORMAL LOW (ref 39.0–52.0)
Hemoglobin: 10.7 g/dL — ABNORMAL LOW (ref 13.0–17.0)
MCH: 28.8 pg (ref 26.0–34.0)
MCHC: 32.8 g/dL (ref 30.0–36.0)
MCV: 87.6 fL (ref 80.0–100.0)
Platelets: 195 K/uL (ref 150–400)
RBC: 3.72 MIL/uL — ABNORMAL LOW (ref 4.22–5.81)
RDW: 15 % (ref 11.5–15.5)
WBC: 8.2 K/uL (ref 4.0–10.5)
nRBC: 0 % (ref 0.0–0.2)

## 2021-10-06 LAB — URINE CULTURE: Culture: >=100000 — AB

## 2021-10-06 LAB — BASIC METABOLIC PANEL WITH GFR
Anion gap: 8 (ref 5–15)
BUN: 21 mg/dL (ref 8–23)
CO2: 31 mmol/L (ref 22–32)
Calcium: 9.3 mg/dL (ref 8.9–10.3)
Chloride: 94 mmol/L — ABNORMAL LOW (ref 98–111)
Creatinine, Ser: 1.23 mg/dL (ref 0.61–1.24)
GFR, Estimated: 58 mL/min — ABNORMAL LOW (ref >60)
Glucose, Bld: 92 mg/dL (ref 70–99)
Potassium: 3.5 mmol/L (ref 3.5–5.1)
Sodium: 133 mmol/L — ABNORMAL LOW (ref 135–145)

## 2021-10-06 MED ORDER — POTASSIUM CHLORIDE CRYS ER 20 MEQ PO TBCR
40.0000 meq | EXTENDED_RELEASE_TABLET | Freq: Once | ORAL | Status: AC
  Administered 2021-10-06: 40 meq via ORAL
  Filled 2021-10-06: qty 2

## 2021-10-06 NOTE — Progress Notes (Signed)
° °  Palliative Medicine Inpatient Follow Up Note   I met this afternoon with patients spouse, [name], daughter, Randell Patient, son in law, Randall Hiss, son, Eddie Dibbles, brother, Harmon Pier, and sister in Sports coach, [place].  A comprehensive conversation regarding code status was held. We reviewed the risks associated with cardiopulmonary resuscitation.  I shared that if we do cardiopulmonary resuscitation properly we risk causing trials potential trauma and in the setting of his multiple comorbid conditions a poor chance of him recovering.  Despite this knowledge he does believe he would wish for resuscitation though he is clear with his family that he would not want to be ventilated beyond 15 days and would not want to live in a vegetative state.  Patient's family are all very respectful of Bach's wishes and his spouse, [name], daughter Randell Patient, and brother Harmon Pier is his surrogate decision makers are able to vocalize they hear his wishes.  Spurgeon expresses that he would not want to be in a position where he endures undue suffering which his family is understanding of.  Although Rishan would want short-term tube feeding he would not want this long-term to sustain life.  Reviewed that in [place] his prior DNR and MOST form have been removed.  Ongoing palliative care and support throughout the remainder of the hospitalization.  SUMMARY OF RECOMMENDATIONS   Full Code/Full Scope of Care  Would not want to live in a prolonged state of vegetation  Would not want to endure undue suffering  Would not want long term tube feeds  Would want a 15 day trial of treatments though if no improvements were seen would want his HCPOA [name], Tonie Griffith) to decide on de-escalation  Ongoing Palliative support  Time Spent: 65  MDM - High  Medical Decision Making:4 #/Complex Problems: 4                     Data Reviewed:    4             Management: 4 (1-Straightforward, 2-Low, 3-Moderate,  4-High) ______________________________________________________________________________________ [place] Team Team Cell Phone: [phone] Please utilize secure chat with additional questions, if there is no response within 30 minutes please call the above phone number  Palliative Medicine Team providers are available by phone from 7am to 7pm daily and can be reached through the team cell phone.  Should this patient require assistance outside of these hours, please call the patient's attending physician.

## 2021-10-06 NOTE — Progress Notes (Addendum)
PROGRESS NOTE  Joshua [name] RFF:[id number] DOB: Jan 16, 1936 DOA: 10/04/2021 PCP: [name], DO   LOS: 1 day   Brief Narrative / Interim history:  86 y.o. male with medical history significant of COPD with chronic hypoxic respiratory failure on 2 L at home, diastolic CHF, RV failure, pulmonary hypertension seen by CHF team as an outpatient, chronic hypotension on midodrine, prior PE on anticoagulation, chronic kidney disease stage IIIa with baseline creatinine 1.3-1.7, BPH, presents to the hospital with complaints of shortness of breath and weakness.  He has been having GI symptoms with nausea, vomiting, diarrhea for 3 days.  Over the past day prior to admission was having increased shortness of breath, cough.  He was febrile in the ED, chest x-ray showed new patchy opacities from the mid to lower left lung, suspicious for pneumonia.  He was placed on antibiotics and admitted to the hospital. CHF team consulted  Subjective / 24h Interval events: Coughing is still bothering him. Breathing better. No abdominal pain, no nausea/vomiting.   Assesement and Plan: Principal Problem:   Arthritis Active Problems:   CAP (community acquired pneumonia)   Personal history of pulmonary embolism   Chronic respiratory failure ([place])   Chronic obstructive pulmonary disease, unspecified (HCC)   Benign prostatic hyperplasia with lower urinary tract symptoms   Diastolic CHF ([place])   Pulmonary hypertension (HCC)   Chronic right-sided heart failure (HCC)   Chronic kidney disease, stage 3b (HCC)   Gout   Acute on chronic respiratory failure with hypoxia (HCC)   Hypokalemia   Assessment and Plan: Principal problem Sepsis due to community-acquired pneumonia-patient initially experienced GI symptoms 3 days ago and then had a lot of nausea and vomiting.  Possibly his pneumonia could have an aspiration component to it or this could all be part of viral illness.  Has been started on ceftriaxone and  azithromycin, continue.  Flu, COVID, RVP all negative -Has received limited IV fluids in the setting of sepsis, sepsis physiology resolved. Now tolerating diuresis  Active problems Chronic hypotension-outpatient blood pressure in office is in the 35T systolic.  Continue midodrine.  Remains asymptomatic. Tolerating diuresis    Chronic diastolic CHF, pulmonary hypertension, RV failure-seen by cardiology as an outpatient. Recent echo showed EF 55-60%, IV septum flattened, severe RV enlargement and severely decreased RV function, PASP 57 mmHg.  Appreciate CHF team. Resume torsemide today.    COPD, acute on chronic hypoxic respiratory failure -required more oxygen on admission up to 5 L, 2L at home, currently on 3L. Continue to encourage ambulation.    History of PE-continue Eliquis   Chronic kidney disease stage IIIa-baseline creatinine 1.3-1.7, slightly elevated 1.9 on admission but back to baseline this morning.    BPH-continue home medications   Gout-continue home medications  Hypokalemia-replete potassium today  Enterococcus in the urine - asymptomatic. Monitor  Scheduled Meds:  allopurinol  100 mg Oral q morning   apixaban  5 mg Oral BID   azithromycin  500 mg Oral Daily   budesonide (PULMICORT) nebulizer solution  0.25 mg Nebulization BID   finasteride  5 mg Oral QHS   gabapentin  300 mg Oral TID AC   metoCLOPramide  5 mg Oral TID AC   midodrine  15 mg Oral TID WC   multivitamin with minerals  1 tablet Oral Daily   pantoprazole  40 mg Oral q morning   potassium chloride  40 mEq Oral Once   Riociguat  2 mg Oral TID   torsemide  40  mg Oral Daily   Continuous Infusions:  cefTRIAXone (ROCEPHIN)  IV 2 g (10/06/21 4650)   chlorproMAZINE (THORAZINE) IV Stopped (10/05/21 1629)   PRN Meds:.chlorproMAZINE (THORAZINE) IV, ipratropium-albuterol  Diet Orders (From admission, onward)     Start     Ordered   10/04/21 1341  Diet Heart Room service appropriate? Yes; Fluid consistency:  Thin  Diet effective now       Question Answer Comment  Room service appropriate? Yes   Fluid consistency: Thin      10/04/21 1340            DVT prophylaxis:  apixaban (ELIQUIS) tablet 5 mg   Lab Results  Component Value Date   PLT 195 10/06/2021      Code Status: Full Code  Family Communication: no family at bedside   Status is: Inpatient  Remains inpatient appropriate because: severity of illness  Level of care: Telemetry Cardiac  Consultants:  Cardiology - CHF  Procedures:  None  Microbiology  Blood cultures 2/16-no growth to date  Antimicrobials: Ceftriaxone / Azithromycin 2/16 >>    Objective: Vitals:   10/06/21 0402 10/06/21 0404 10/06/21 0800 10/06/21 0827  BP:  (!) 99/53 (!) 108/54   Pulse:  93 91 92  Resp:  17  16  Temp:  98.1 F (36.7 C)    TempSrc:  Oral    SpO2:  91% 90% 97%  Weight: 86.5 kg     Height:        Intake/Output Summary (Last 24 hours) at 10/06/2021 1005 Last data filed at 10/06/2021 3546 Gross per 24 hour  Intake 845 ml  Output 2050 ml  Net -1205 ml    Wt Readings from Last 3 Encounters:  10/06/21 86.5 kg  09/11/21 87.1 kg  08/24/21 81.6 kg    Examination:  Constitutional: NAD Eyes: lids and conjunctivae normal, no scleral icterus ENMT: mmm Neck: normal, supple Respiratory: faint bibasilar rhonchi, no wheezing, no crackles. Normal respiratory effort.  Cardiovascular: Regular rate and rhythm, no murmurs / rubs / gallops. No LE edema. Abdomen: soft, no distention, no tenderness. Bowel sounds positive.  Skin: no rashes Neurologic: no focal deficits, equal strength   Data Reviewed: I have independently reviewed following labs and imaging studies   CBC Recent Labs  Lab 10/04/21 0920 10/04/21 1008 10/05/21 0412 10/06/21 0339  WBC 14.3*  --  13.5* 8.2  HGB 11.6* 12.2* 9.8* 10.7*  HCT 36.2* 36.0* 29.2* 32.6*  PLT 230  --  174 195  MCV 90.3  --  88.0 87.6  MCH 28.9  --  29.5 28.8  MCHC 32.0  --  33.6  32.8  RDW 15.4  --  15.3 15.0  LYMPHSABS 0.5*  --   --   --   MONOABS 0.9  --   --   --   EOSABS 0.1  --   --   --   BASOSABS 0.0  --   --   --      Recent Labs  Lab 10/04/21 0919 10/04/21 0920 10/04/21 1008 10/05/21 0412 10/06/21 0339  NA  --  138 138 135 133*  K  --  4.9 3.5 3.4* 3.5  CL  --  95*  --  96* 94*  CO2  --  33*  --  31 31  GLUCOSE  --  125*  --  84 92  BUN  --  34*  --  28* 21  CREATININE  --  1.92*  --  1.44*  1.23  CALCIUM  --  9.5  --  9.3 9.3  AST  --  44*  --  17  --   ALT  --  20  --  10  --   ALKPHOS  --  80  --  56  --   BILITOT  --  1.9*  --  0.8  --   ALBUMIN  --  3.3*  --  2.5*  --   LATICACIDVEN 2.2*  --   --   --   --   INR  --  1.6*  --   --   --      ------------------------------------------------------------------------------------------------------------------ No results for input(s): CHOL, HDL, LDLCALC, TRIG, CHOLHDL, LDLDIRECT in the last 72 hours.  No results found for: HGBA1C ------------------------------------------------------------------------------------------------------------------ No results for input(s): TSH, T4TOTAL, T3FREE, THYROIDAB in the last 72 hours.  Invalid input(s): FREET3  Cardiac Enzymes No results for input(s): CKMB, TROPONINI, MYOGLOBIN in the last 168 hours.  Invalid input(s): CK ------------------------------------------------------------------------------------------------------------------    Component Value Date/Time   BNP 124.9 (H) 09/11/2021 1030    CBG: No results for input(s): GLUCAP in the last 168 hours.  Recent Results (from the past 240 hour(s))  Resp Panel by RT-PCR (Flu A&B, Covid) Nasopharyngeal Swab     Status: None   Collection Time: 10/04/21  9:19 AM   Specimen: Nasopharyngeal Swab; Nasopharyngeal(NP) swabs in vial transport medium  Result Value Ref Range Status   SARS Coronavirus 2 by RT PCR NEGATIVE NEGATIVE Final    Comment: (NOTE) SARS-CoV-2 target nucleic acids are NOT  DETECTED.  The SARS-CoV-2 RNA is generally detectable in upper respiratory specimens during the acute phase of infection. The lowest concentration of SARS-CoV-2 viral copies this assay can detect is 138 copies/mL. A negative result does not preclude SARS-Cov-2 infection and should not be used as the sole basis for treatment or other patient management decisions. A negative result may occur with  improper specimen collection/handling, submission of specimen other than nasopharyngeal swab, presence of viral mutation(s) within the areas targeted by this assay, and inadequate number of viral copies(<138 copies/mL). A negative result must be combined with clinical observations, patient history, and epidemiological information. The expected result is Negative.  Fact Sheet for Patients:  EntrepreneurPulse.com.au  Fact Sheet for Healthcare Providers:  IncredibleEmployment.be  This test is no t yet approved or cleared by the [place] FDA and  has been authorized for detection and/or diagnosis of SARS-CoV-2 by FDA under an Emergency Use Authorization (EUA). This EUA will remain  in effect (meaning this test can be used) for the duration of the COVID-19 declaration under Section 564(b)(1) of the Act, 21 U.S.C.section 360bbb-3(b)(1), unless the authorization is terminated  or revoked sooner.       Influenza A by PCR NEGATIVE NEGATIVE Final   Influenza B by PCR NEGATIVE NEGATIVE Final    Comment: (NOTE) The Xpert Xpress SARS-CoV-2/FLU/RSV plus assay is intended as an aid in the diagnosis of influenza from Nasopharyngeal swab specimens and should not be used as a sole basis for treatment. Nasal washings and aspirates are unacceptable for Xpert Xpress SARS-CoV-2/FLU/RSV testing.  Fact Sheet for Patients: EntrepreneurPulse.com.au  Fact Sheet for Healthcare Providers: IncredibleEmployment.be  This test is not yet  approved or cleared by the [place] FDA and has been authorized for detection and/or diagnosis of SARS-CoV-2 by FDA under an Emergency Use Authorization (EUA). This EUA will remain in effect (meaning this test can be used) for the duration of the COVID-19  declaration under Section 564(b)(1) of the Act, 21 U.S.C. section 360bbb-3(b)(1), unless the authorization is terminated or revoked.  Performed at [facility], [place] [address] [place], [place] [address]   Urine Culture     Status: Abnormal (Preliminary result)   Collection Time: 10/04/21  9:19 AM   Specimen: In/Out Cath Urine  Result Value Ref Range Status   Specimen Description IN/OUT CATH URINE  Final   Special Requests NONE  Final   Culture (A)  Final    >=100,000 COLONIES/mL ENTEROCOCCUS FAECALIS SUSCEPTIBILITIES TO FOLLOW Performed at [facility], [place] [address], [place], [place] [address]    Report Status PENDING  Incomplete  Blood Culture (routine x 2)     Status: None (Preliminary result)   Collection Time: 10/04/21  9:20 AM   Specimen: BLOOD LEFT WRIST  Result Value Ref Range Status   Specimen Description BLOOD LEFT WRIST  Final   Special Requests   Final    BOTTLES DRAWN AEROBIC AND ANAEROBIC Blood Culture results may not be optimal due to an inadequate volume of blood received in culture bottles   Culture   Final    NO GROWTH < 24 HOURS Performed at [facility], [place] [address] [place], [place] [address]    Report Status PENDING  Incomplete  Blood Culture (routine x 2)     Status: None (Preliminary result)   Collection Time: 10/04/21  9:25 AM   Specimen: BLOOD RIGHT FOREARM  Result Value Ref Range Status   Specimen Description BLOOD RIGHT FOREARM  Final   Special Requests   Final    BOTTLES DRAWN AEROBIC AND ANAEROBIC Blood Culture results may not be optimal due to an inadequate volume of blood received in culture bottles   Culture   Final    NO GROWTH < 24 HOURS Performed at [facility], [place] [address], [place], [place] [address]    Report Status PENDING  Incomplete  Respiratory (~20 pathogens) panel by PCR     Status: None   Collection Time: 10/04/21 12:24 PM   Specimen: Nasopharyngeal Swab; Respiratory  Result Value Ref Range Status   Adenovirus NOT DETECTED NOT DETECTED Final   Coronavirus 229E NOT DETECTED NOT DETECTED Final    Comment: (NOTE) The Coronavirus on the Respiratory Panel, DOES NOT test for the novel  Coronavirus (2019 nCoV)    Coronavirus HKU1 NOT DETECTED NOT DETECTED Final   Coronavirus NL63 NOT DETECTED NOT DETECTED Final   Coronavirus OC43 NOT DETECTED NOT DETECTED Final   Metapneumovirus NOT DETECTED NOT DETECTED Final   Rhinovirus / Enterovirus NOT DETECTED NOT DETECTED Final   Influenza A NOT DETECTED NOT DETECTED Final   Influenza B NOT DETECTED NOT DETECTED Final   Parainfluenza Virus 1 NOT DETECTED NOT DETECTED Final   Parainfluenza Virus 2 NOT DETECTED NOT DETECTED Final   Parainfluenza Virus 3 NOT DETECTED NOT DETECTED Final   Parainfluenza Virus 4 NOT DETECTED NOT DETECTED Final   Respiratory Syncytial Virus NOT DETECTED NOT DETECTED Final   Bordetella pertussis NOT DETECTED NOT DETECTED Final   Bordetella Parapertussis NOT DETECTED NOT DETECTED Final   Chlamydophila pneumoniae NOT DETECTED NOT DETECTED Final   Mycoplasma pneumoniae NOT DETECTED NOT DETECTED Final    Comment: Performed at [facility], [place]. [address], [place], [place] [address]     Radiology Studies: No results found.   Marzetta Board, MD, PhD [facility]  Between 7 am - 7  pm I am available, please contact me via Amion (for emergencies) or Securechat (non urgent messages)  Between 7 pm - 7 am I am not available, please contact night coverage MD/APP via Amion

## 2021-10-06 NOTE — Progress Notes (Signed)
Patient ID: Joshua [name], male   DOB: [birth year], 86 y.o.   MRN: [id number]     Advanced Heart Failure Rounding Note  PCP-Cardiologist: Sherren Mocha, MD   Subjective:    He is on 3L oxygen this morning, wears 2L at home.  Feels better overall.   Reasonable diuresis yesterday with IV Lasix.    Afebrile, stable BP.  Urine culture with E faecalis.    Objective:   Weight Range: 86.5 kg Body mass index is 23.84 kg/m.   Vital Signs:   Temp:  [98 F (36.7 C)-98.1 F (36.7 C)] 98.1 F (36.7 C) (02/18 0404) Pulse Rate:  [86-104] 92 (02/18 0827) Resp:  [15-20] 16 (02/18 0827) BP: (89-108)/(52-60) 108/54 (02/18 0800) SpO2:  [90 %-99 %] 97 % (02/18 0827) FiO2 (%):  [32 %] 32 % (02/18 0827) Weight:  [86.5 kg] 86.5 kg (02/18 0402) Last BM Date : 10/05/21  Weight change: Filed Weights   10/04/21 1546 10/05/21 0400 10/06/21 0402  Weight: 85 kg 86 kg 86.5 kg    Intake/Output:   Intake/Output Summary (Last 24 hours) at 10/06/2021 0948 Last data filed at 10/06/2021 0807 Gross per 24 hour  Intake 845 ml  Output 2050 ml  Net -1205 ml      Physical Exam    General:  Well appearing. No resp difficulty HEENT: Normal Neck: Supple. JVP 7-8. Carotids 2+ bilat; no bruits. No lymphadenopathy or thyromegaly appreciated. Cor: PMI nondisplaced. Regular rate & rhythm. No rubs, gallops or murmurs. Lungs: Distant BS Abdomen: Soft, nontender, nondistended. No hepatosplenomegaly. No bruits or masses. Good bowel sounds. Extremities: No cyanosis, clubbing, rash, edema Neuro: Alert & orientedx3, cranial nerves grossly intact. moves all 4 extremities w/o difficulty. Affect pleasant   EKG    NSR, RBBB, QTc 495  Labs    CBC Recent Labs    10/04/21 0920 10/04/21 1008 10/05/21 0412 10/06/21 0339  WBC 14.3*  --  13.5* 8.2  NEUTROABS 12.6*  --   --   --   HGB 11.6*   < > 9.8* 10.7*  HCT 36.2*   < > 29.2* 32.6*  MCV 90.3  --  88.0 87.6  PLT 230  --  174 195   < > = values in this  interval not displayed.   Basic Metabolic Panel Recent Labs    10/05/21 0412 10/06/21 0339  NA 135 133*  K 3.4* 3.5  CL 96* 94*  CO2 31 31  GLUCOSE 84 92  BUN 28* 21  CREATININE 1.44* 1.23  CALCIUM 9.3 9.3   Liver Function Tests Recent Labs    10/04/21 0920 10/05/21 0412  AST 44* 17  ALT 20 10  ALKPHOS 80 56  BILITOT 1.9* 0.8  PROT 7.0 5.6*  ALBUMIN 3.3* 2.5*   No results for input(s): LIPASE, AMYLASE in the last 72 hours. Cardiac Enzymes No results for input(s): CKTOTAL, CKMB, CKMBINDEX, TROPONINI in the last 72 hours.  BNP: BNP (last 3 results) Recent Labs    04/22/21 0958 06/14/21 0955 09/11/21 1030  BNP 1,156.1* 389.4* 124.9*    ProBNP (last 3 results) No results for input(s): PROBNP in the last 8760 hours.   D-Dimer No results for input(s): DDIMER in the last 72 hours. Hemoglobin A1C No results for input(s): HGBA1C in the last 72 hours. Fasting Lipid Panel No results for input(s): CHOL, HDL, LDLCALC, TRIG, CHOLHDL, LDLDIRECT in the last 72 hours. Thyroid Function Tests No results for input(s): TSH, T4TOTAL, T3FREE, THYROIDAB in the last  72 hours.  Invalid input(s): FREET3  Other results:   Imaging    No results found.   Medications:     Scheduled Medications:  allopurinol  100 mg Oral q morning   apixaban  5 mg Oral BID   azithromycin  500 mg Oral Daily   budesonide (PULMICORT) nebulizer solution  0.25 mg Nebulization BID   finasteride  5 mg Oral QHS   gabapentin  300 mg Oral TID AC   metoCLOPramide  5 mg Oral TID AC   midodrine  15 mg Oral TID WC   multivitamin with minerals  1 tablet Oral Daily   pantoprazole  40 mg Oral q morning   Riociguat  2 mg Oral TID   torsemide  40 mg Oral Daily    Infusions:  cefTRIAXone (ROCEPHIN)  IV 2 g (10/06/21 1308)   chlorproMAZINE (THORAZINE) IV Stopped (10/05/21 1629)    PRN Medications: chlorproMAZINE (THORAZINE) IV, ipratropium-albuterol    Assessment/Plan   1. Chronic HFpEF/RV  failure: Echo (2/22) with EF 55-60%, IV septum flattened, severe RV enlargement, severely decreased RV function, PASP 57 mmHg.  He has severe RV failure.  Volume status improved today after IV Lasix yesterday.  - Will restart home Torsemide today.  - Continue midodrine 15 mg tid. Would avoid fludrocortisone. - Off farxiga due to yeast infections - Needs to wear compression hose + abdominal binder every day. 2. Pulmonary HTN: PAH noted on 4/22 RHC with PVR 6.1 WU.  This appears to be multifactorial with OSA, severe emphysema, and a suspected chronic PE involving the right middle lobe (group 3 and group 4 PH). Given the suspected mixed etiology with only 1 area of chronic thromboembolism (right middle lobe) as well as age, do not think that pulmonary thromboendarterectomy would be indicated.  Rheumatologic serologic workup was negative.  PFTs showed severe obstruction and moderate restriction, suggesting significant COPD.  - Continue riociguat for CTEPH, on 2 mg tid.  - Currently on 3L oxygen (2L at home) in setting of PNA 3. Acute on chronic respiratory failure with hypoxia/PNA.  Secondary to CAP. On ceftriaxone and azithromycin.  QTc on yesterday's ECG was acceptable, can continue azithromycin.  4. Stage III CKD: Scr 1.92>1.44>1.23.  5. OSA: Moderate OSA on sleep study, waiting for CPAP.  6. Emphysema: Prior smoker.  Emphysema on CT and severe obstruction on PFTs - He uses home oxygen as above.   7. Chronic PE: Diagnosed by V/Q scan.  No abnormal bleeding.  - Continue Eliquis.  8. Hiatial hernia w/ esophagitis:  - Continue PPI. 9. Hypokalemia: - K 3.5. Supp. 10. UTI: Urine culture with E faecalis, per [facility].   Stable from cardiac perspective, will see again Monday.   Length of Stay: 1  Loralie Champagne, MD  10/06/2021, 9:48 AM  Advanced Heart Failure Team Pager [phone] (M-F; 7a - 5p)  Please contact [facility] for night-coverage after hours (5p -7a ) and weekends on [email or web address]

## 2021-10-06 NOTE — Progress Notes (Signed)
Patient was on 4.5L O2, RN turned O2 down to 3L. Patient sats remained WNL for a while, suddenly sats dropped to 87% and remained 86-8%. RN put O2 back on 4L, now sats have remained above 90%.

## 2021-10-06 NOTE — Plan of Care (Signed)
Problem: Clinical Measurements: Goal: Respiratory complications will improve Outcome: Progressing   Problem: Clinical Measurements: Goal: Cardiovascular complication will be avoided Outcome: Progressing   Problem: Activity: Goal: Risk for activity intolerance will decrease Outcome: Progressing

## 2021-10-06 NOTE — Progress Notes (Addendum)
Physical Therapy Treatment Patient Details Name: Joshua [name] MRN: [id number] DOB: 20-Aug-1935 Today's Date: 10/06/2021   History of Present Illness The pt is an 86 yo male presenting from home on 2/16 with c/o generalized weakness, SOB, and n/v/d. Pt hypoxic to 68% upon arrival, chest imaging suggestive of PNA. PMH includes: COPD on 2L O2 at baseline, CHF, RV failure, pulmonary HTN, CKD III, PE, BPH.    PT Comments    The pt was eager to participate in session with focus on stair training as the pt has 5 steps to enter and 12 to reach his bedroom/bathroom. The pt was able to complete 150 ft ambulation without need for DME with 3-4 L O2 and complete 5 steps then 6 steps after standing rest with 4L O2. The pt maintained SpO2 > 96% with all ambulation, even on 3L but did have HR increase to high of 138 bpm with exertion. Will continue to benefit from skilled PT acutely to progress endurance and activity tolerance for activities with higher intensity like stair climbing.  SpO2 ambulating on 4L: 100% SpO2 ambulating on 3L: 96%   Recommendations for follow up therapy are one component of a multi-disciplinary discharge planning process, led by the attending physician.  Recommendations may be updated based on patient status, additional functional criteria and insurance authorization.  Follow Up Recommendations  Home health PT     Assistance Recommended at Discharge Intermittent Supervision/Assistance  Patient can return home with the following A little help with walking and/or transfers;A little help with bathing/dressing/bathroom;Assistance with cooking/housework;Help with stairs or ramp for entrance   Equipment Recommendations  None recommended by PT    Recommendations for Other Services       Precautions / Restrictions Precautions Precautions: Fall Precaution Comments: pt reports one fall last nov, wat SpO2. on 3-4L for mobility Restrictions Weight Bearing Restrictions: No      Mobility  Bed Mobility Overal bed mobility: Modified Independent             General bed mobility comments: pt OOB in recliner at start and end of session    Transfers Overall transfer level: Modified independent Equipment used: None Transfers: Sit to/from Stand             General transfer comment: no assist given, line management only    Ambulation/Gait Ambulation/Gait assistance: Min guard Gait Distance (Feet): 150 Feet Assistive device: None Gait Pattern/deviations: Step-through pattern, Decreased stride length Gait velocity: decreased but pt reports normal     General Gait Details: mild instability, no overt LOB and only minG for safety. on 4L initially with SpO2 100%, 96-98% on 3L   Stairs Stairs: Yes Stairs assistance: Min guard Stair Management: One rail Right, Alternating pattern, Forwards Number of Stairs: 5 (+ 6) General stair comments: pt able to complete with SpO2 > 90% on 4L or 3L. HR to max 135bpm      Balance Overall balance assessment: Mild deficits observed, not formally tested, History of Falls                                          Cognition Arousal/Alertness: Awake/alert Behavior During Therapy: WFL for tasks assessed/performed Overall Cognitive Status: Within Functional Limits for tasks assessed  Exercises      General Comments General comments (skin integrity, edema, etc.): HR to 138bpm max, SpO2 >96% on 3L with ambulation, low of 91% after activity      Pertinent Vitals/Pain Pain Assessment Pain Assessment: No/denies pain     PT Goals (current goals can now be found in the care plan section) Acute Rehab PT Goals Patient Stated Goal: return home PT Goal Formulation: With patient Time For Goal Achievement: 10/19/21 Potential to Achieve Goals: Good Progress towards PT goals: Progressing toward goals    Frequency    Min 3X/week       PT Plan Current plan remains appropriate       AM-PAC PT "6 Clicks" Mobility   Outcome Measure  Help needed turning from your back to your side while in a flat bed without using bedrails?: None Help needed moving from lying on your back to sitting on the side of a flat bed without using bedrails?: None Help needed moving to and from a bed to a chair (including a wheelchair)?: A Little Help needed standing up from a chair using your arms (e.g., wheelchair or bedside chair)?: A Little Help needed to walk in hospital room?: A Little Help needed climbing 3-5 steps with a railing? : A Little 6 Click Score: 20    End of Session Equipment Utilized During Treatment: Gait belt;Oxygen Activity Tolerance: Patient tolerated treatment well Patient left: in chair;with call bell/phone within reach;with family/visitor present Nurse Communication: Mobility status PT Visit Diagnosis: Other abnormalities of gait and mobility (R26.89);Muscle weakness (generalized) (M62.81)     Time: 2263-3354 PT Time Calculation (min) (ACUTE ONLY): 37 min  Charges:  $Therapeutic Exercise: 23-37 mins                     West Carbo, PT, DPT   Acute Rehabilitation Department Pager #: [phone]   Sandra Cockayne 10/06/2021, 5:19 PM

## 2021-10-06 NOTE — Progress Notes (Signed)
Mobility Specialist Progress Note   10/06/21 1158  Mobility  Activity Ambulated with assistance in room  Level of Assistance Standby assist, set-up cues, supervision of patient - no hands on  Assistive Device None  Distance Ambulated (ft) 470 ft  Activity Response Tolerated well  $Mobility charge 1 Mobility   Pt received in chair on 3LO2 having no complaints. SpO2 saturated at 84%-88% on 3LO2 and 92% - 100% on 4LO2 while ambulating. Short signs of Vtach throughout w/ HR ranging from 101 - 146, pt reporting no symptoms but RN notified. Left in chair w/ call bell in reach.    Holland Falling Mobility Specialist Phone Number [phone]

## 2021-10-06 NOTE — Consult Note (Signed)
Palliative Medicine Inpatient Consult Note  Consulting Provider: Caren Griffins, MD  Reason for consult:   Joshua [name] Palliative Medicine Consult  Reason for Consult? family request to discuss with palliative, [name] was a DNR / partial, now full code, would like further [place] discussions    HPI:  Per Hospitalist Note -->  86 y.o. male with medical history significant of COPD with chronic hypoxic respiratory failure on 2 L at home, diastolic CHF, RV failure, pulmonary hypertension seen by CHF team as an outpatient, chronic hypotension on midodrine, prior PE on anticoagulation, chronic kidney disease stage IIIa with baseline creatinine 1.3-1.7, BPH, presents to the hospital with complaints of shortness of breath and weakness.  He has been having GI symptoms with nausea, vomiting, diarrhea for 3 days.  Over the past day prior to admission was having increased shortness of breath, cough.  He was febrile in the ED, chest x-ray showed new patchy opacities from the mid to lower left lung, suspicious for pneumonia.  He was placed on antibiotics and admitted to the hospital.  Palliative care has been asked to address goals of care in the setting of multiple comorbid conditions inclusive of diastolic heart failure, COPD, and most recently sepsis in the setting of CAP.  Joshua [name] had been seen by the palliative care team multiple times in the past and had been an established DO NOT RESUSCITATE though recently he is changed himself back to full code.  Clinical Assessment/Goals of Care:  *Please note that this is a verbal dictation therefore any spelling or grammatical errors are due to the "[place]" system interpretation.  I have reviewed medical records including EPIC notes, labs and imaging, received report from bedside RN, assessed the [name] who is sitting up in the recliner chair in no acute distress.    I met with Joshua [name] to further discuss diagnosis  prognosis, GOC, EOL wishes, disposition and options.   I introduced Palliative Medicine as specialized medical care for people living with serious illness. It focuses on providing relief from the symptoms and stress of a serious illness. The goal is to improve quality of life for both the [name] and the family.  Medical History Review and Understanding:  I reviewed with Joshua [name] his history of COPD for which she is on 2 L of oxygen at home chronically, diastolic heart failure, CKD, gout, and pulmonary hypertension.  Comprehensive discussion of diastolic heart failure in the setting of NYHA classification is held.  I reviewed with Joshua [name] that he has been in and out of the hospital multiple times in the past 6 months.  He acknowledges that he has not been in the best of health  Social History:  Joshua [name] is from [place], [place].  He has been married to his spouse, Joshua [name] for the past 44 years.  They are each each other second marriage and Addiel has a daughter from his first marriage while Joshua [name] has a son from her first marriage.  Joshua [name] went to [employer] at [employer] and studied music.  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  He loves traveling and his favorite destination is [facility].  He has a strong love of music as well.  He is a man of faith and practices within the [facility] denomination.  Functional and Nutritional State:  Prior to hospitalization Joshua [name] had been living with his spouse and able to perform all B ADLs independently.  He  mobilizes with a cane when he goes out but otherwise shares he does not do much of anything so does not need a mobility aid in the home.  His wife helps with meal preparation.  Joshua [name] shares that he does not have any nutritional deficits and has a fair appetite.  Palliative Symptoms:  From a symptom perspective Joshua [name] experiences shortness of breath after taking about 6 to 7 feet.   He tends to stop regain his breath and continue mobilizing.  Advance Directives: A detailed discussion was had today regarding advanced directives.  Joshua [name] shares that his wife, Joshua [name] will be his surrogate decision-maker if needed and his daughter, Joshua [name] would be his second Marine scientist.  He does have these documented in [place].  Code Status: Concepts specific to code status, artifical feeding and hydration, continued IV antibiotics and rehospitalization was had.   I reviewed with Joshua [name] that in the past it appears he has not wanted to be resuscitated which he is able to elaborate on.  Joshua [name] tells me that on prior admissions he had been in "much worse shape" than he is now and he feels that if he were to go into cardiac arrest he would want to "try it 1 or 2 times" in terms of resuscitation.  I shared with him that its not quite that simple and in all honesty with his underlying comorbidities his likelihood of having a successful resuscitation to his baseline functional state is very slim to none.  Encouraged Joshua [name] to consider DNR/DNI status understanding evidenced based poor outcomes in similar hospitalized [name], as the cause of arrest is likely associated with advanced chronic/terminal illness rather than an easily reversible acute cardio-pulmonary event. I explained that DNR/DNI does not change the medical plan and it only comes into effect after a person has arrested (died).  It is a protective measure to keep [place] from harming the [name] in their last moments of life.  Trial shares that he would like to think about this and discuss it with his wife.  I expressed that it would be important for [place] all to me later for further conversations on this very tumultuous topic.   Provided  "Hard Choices for Loving People" booklet & a MOST form for review and completion.  Goals for the Future:  Joshua [name] would ideally like to go back home in the next day or 2.  As sure as if he would be  willing to have an outpatient palliative provider follow along in the setting of his chronic disease processes to better aid in ongoing conversations and symptom management.  He shares with me "I do not think I need that".  I again reiterated the diseases that are burdening him right now as well as his recurrent hospitalizations and shared it might be worthwhile for an additional set of eyes to keep track of him.  This too is something he would like to think about.  Discussed the importance of continued conversation with family and their  medical providers regarding overall plan of care and treatment options, ensuring decisions are within the context of the patients values and GOCs. ____________________________________ Addendum:  I spoke to [name]'s wife, Joshua [name] over the phone.  She shares with me her plan to be in at 330 this afternoon with Joshua [name]'s brother and asks if we can meet then.  Solidified that this would be an ideal meeting time to further discuss goals of care.  Decision Maker: Joshua [name] can presently make decisions for himself though if unable to do  so he would wish for his wife Joshua [name] to be his primary decision maker.  SUMMARY OF RECOMMENDATIONS   Full code/full scope of care --> ongoing conversations regarding this topic to ensue later this afternoon  [name]'s personal goals are to get back home to his prior baseline level of function  Recommended outpatient palliative support, though [name] would like to think about this further  Plan to meet with [name] and his wife at 93 today  Ongoing palliative care support  Code Status/Advance Care Planning: FULL CODE   Palliative Prophylaxis:  Aspiration, Bowel Regimen, Delirium Protocol, Frequent Pain Assessment, Oral Care, Palliative Wound Care, and Turn Reposition  Additional Recommendations (Limitations, Scope, Preferences): Full Scope Treatment  Psycho-social/Spiritual:  Desire for further Chaplaincy support:  No Additional Recommendations: Education on diastolic heart failure   Prognosis: Prognosis in the setting of multiple comorbidities, increased frailty, recurrent hospitalizations --> increased 30-monthmortality risk  Discharge Planning: Discharge plan uncertain at this time though [name] shares he would like to go home with home health.  Vitals:   10/06/21 0827 10/06/21 1159  BP:    Pulse: 92   Resp: 16 17  Temp:    SpO2: 97% 94%    Intake/Output Summary (Last 24 hours) at 10/06/2021 1336 Last data filed at 10/06/2021 1159 Gross per 24 hour  Intake 1205 ml  Output 2700 ml  Net -1495 ml   Last Weight  Most recent update: 10/06/2021  4:04 AM    Weight  86.5 kg (190 lb 11.2 oz)            Gen: Elderly African-American male in no acute distress HEENT: moist mucous membranes CV: Regular rate and rhythm PULM: On 3LPM [place] breathing is even and unlabored ABD: soft/nontender EXT: No edema Neuro: Alert and oriented x3  PPS: 50-60%   This conversation/these recommendations were discussed with [name] primary care team, Dr. GCruzita Lederer MDM High  Medical Decision Making:4 #/Complex Problems: 4                     Data Reviewed:4                 Management:4 (1-Straightforward, 2-Low, 3-Moderate, 4-High) ______________________________________________________ [place] Team Cell Phone: [phone] utilize secure chat with additional questions, if there is no response within 30 minutes please call the above phone number  Palliative Medicine Team providers are available by phone from 7am to 7pm daily and can be reached through the team cell phone.  Should this [name] require assistance outside of these hours, please call the [name]'s attending physician.

## 2021-10-07 DIAGNOSIS — M1990 Unspecified osteoarthritis, unspecified site: Secondary | ICD-10-CM

## 2021-10-07 DIAGNOSIS — Z7189 Other specified counseling: Secondary | ICD-10-CM

## 2021-10-07 LAB — BASIC METABOLIC PANEL
Anion gap: 8 (ref 5–15)
BUN: 16 mg/dL (ref 8–23)
CO2: 30 mmol/L (ref 22–32)
Calcium: 9.4 mg/dL (ref 8.9–10.3)
Chloride: 96 mmol/L — ABNORMAL LOW (ref 98–111)
Creatinine, Ser: 1.2 mg/dL (ref 0.61–1.24)
GFR, Estimated: >60 mL/min (ref >60)
Glucose, Bld: 90 mg/dL (ref 70–99)
Potassium: 3.8 mmol/L (ref 3.5–5.1)
Sodium: 134 mmol/L — ABNORMAL LOW (ref 135–145)

## 2021-10-07 MED ORDER — CEFUROXIME AXETIL 500 MG PO TABS
500.0000 mg | ORAL_TABLET | Freq: Two times a day (BID) | ORAL | 0 refills | Status: DC
Start: 2021-10-08 — End: 2021-12-26

## 2021-10-07 MED ORDER — AZITHROMYCIN 250 MG PO TABS: 500.0000 mg | ORAL_TABLET | Freq: Once | ORAL | 0 refills | Status: AC

## 2021-10-07 NOTE — Progress Notes (Signed)
Pt has orders to be discharged. Discharge instructions given and pt has no additional questions at this time. Medication regimen reviewed and pt educated. Pt verbalized understanding and has no additional questions. Telemetry box removed. IV removed and site in good condition. Pt stable and waiting for transportation. 

## 2021-10-07 NOTE — Progress Notes (Signed)
° °  Palliative Medicine Inpatient Follow Up Note  HPI: 86 y.o. male with medical history significant of COPD with chronic hypoxic respiratory failure on 2 L at home, diastolic CHF, RV failure, pulmonary hypertension seen by CHF team as an outpatient, chronic hypotension on midodrine, prior PE on anticoagulation, chronic kidney disease stage IIIa with baseline creatinine 1.3-1.7, BPH, presents to the hospital with complaints of shortness of breath and weakness.  He has been having GI symptoms with nausea, vomiting, diarrhea for 3 days.  Over the past day prior to admission was having increased shortness of breath, cough.  He was febrile in the ED, chest x-ray showed new patchy opacities from the mid to lower left lung, suspicious for pneumonia.  He was placed on antibiotics and admitted to the hospital.  Today's Discussion (10/07/2021):  *Please note that this is a verbal dictation therefore any spelling or grammatical errors are due to the "[place]" system interpretation.  Chart reviewed inclusive of vital signs, progress notes, laboratory results, and diagnostic images.   I met with Joshua [name] at bedside this morning. He shares optimism to discharge today. We review a summary of our conversation from last night. [name] states that he understands "better" now.   [name] also endorses that his wife and sister-in-law have made their own determinations on code status for the future which was quite helpful for him to hear. I shared that we can always re-discuss this topic if something changes.  Reviewed patients reading of pop culture magazines for leisure.   Emotional support offered through therapeutic listening.   Questions and concerns addressed   Objective Assessment: Vital Signs Vitals:   10/07/21 0750 10/07/21 0847  BP: (!) 87/50   Pulse: 99   Resp: (!) 30   Temp:    SpO2: 91% 95%    Intake/Output Summary (Last 24 hours) at 10/07/2021 1149 Last data filed at 10/07/2021  1010 Gross per 24 hour  Intake 1500 ml  Output 2475 ml  Net -975 ml   Last Weight  Most recent update: 10/07/2021  1:22 AM    Weight  86.6 kg (190 lb 14.4 oz)            Gen: Elderly African-American male in no acute distress HEENT: moist mucous membranes CV: Regular rate and rhythm PULM: On 3LPM [place] breathing is even and unlabored ABD: soft/nontender EXT: No edema Neuro: Alert and oriented x3  SUMMARY OF RECOMMENDATIONS   Full code/full scope of care   Patient's personal goals are to get back home to his prior baseline level of function  Plan for discharge today  Resume OP Palliative care on discharge  MDM - Moderate  ______________________________________________________________________________________ [place] Team Team Cell Phone: [phone] Please utilize secure chat with additional questions, if there is no response within 30 minutes please call the above phone number  Palliative Medicine Team providers are available by phone from 7am to 7pm daily and can be reached through the team cell phone.  Should this patient require assistance outside of these hours, please call the patient's attending physician.

## 2021-10-07 NOTE — Progress Notes (Signed)
Mobility Specialist Progress Note   10/07/21 1200  Mobility  Activity Ambulated independently in hallway  Level of Assistance Independent after set-up  Assistive Device None  Distance Ambulated (ft) 480 ft  Activity Response Tolerated well  $Mobility charge 1 Mobility   Received pt on 3LO2 awaiting ride home and agreeable to mobility. Asx throughout ambulation w/ SpO2 remaining >89% on 3LO2. Returned back to chair w/ call bell in reach and needs met.  Holland Falling Mobility Specialist Phone Number [phone]

## 2021-10-07 NOTE — TOC Transition Note (Signed)
Transition of Care [facility]) - CM/SW Discharge Note   Patient Details  Name: Joshua [name] MRN: [id number] Date of Birth: December 05, 1935  [facility] CM/SW Contact:  Gayla Medicus., RN Phone Number: 10/07/2021, 11:26 AM   Clinical Narrative:     Notified HH that patient will discharge today.    Final next level of care: [place] Barriers to Discharge: Continued Medical Work up   Patient Goals and CMS Choice Patient states their goals for this hospitalization and ongoing recovery are:: return home with wife CMS Medicare.gov Compare Post Acute Care list provided to:: Patient Choice offered to / list presented to : Patient  Discharge Placement                       Discharge Plan and Services   Discharge Planning Services: CM Consult Post Acute Care Choice: Home Health            DME Agency: NA       HH Arranged: PT [place] Agency: [place] ([facility]) Date HH Agency Contacted: 10/07/21 Time [place]: 0044 Representative spoke with at [place]: [place] ([place]) Interventions     Readmission Risk Interventions Readmission Risk Prevention Plan 10/05/2021  Transportation Screening Complete  Medication Review Press photographer) Complete  PCP or Specialist appointment within 3-5 days of discharge Complete  HRI or [place] Complete  SW Recovery Care/Counseling Consult Complete  [place] Not Applicable  Some recent data might be hidden

## 2021-10-07 NOTE — Discharge Summary (Signed)
Physician Discharge Summary   Patient: Joshua [name] MRN: [id number] DOB: 06-24-1936  Admit date:     10/04/2021  Discharge date: 10/07/21  Discharge Physician: Debbe Odea   PCP: [name], DO   Recommendations at discharge:      Discharge Diagnoses: Principal Problem:   Acute on chronic respiratory failure with hypoxia [facility]) Active Problems:   CAP (community acquired pneumonia)   Personal history of pulmonary embolism   Chronic respiratory failure ([place])   Chronic obstructive pulmonary disease, unspecified (HCC)   Arthritis   Benign prostatic hyperplasia with lower urinary tract symptoms   Chronic diastolic CHF (congestive heart failure) ([place])   Pulmonary hypertension ([place])   Chronic right-sided heart failure (HCC)   CKD (chronic kidney disease) stage 2, GFR 60-89 ml/min   Gout   Hypokalemia   Palliative care by specialist  Resolved Problems:   * No resolved hospital problems. *   Brief Narrative / Interim history:  87 y.o. male with medical history significant of COPD with chronic hypoxic respiratory failure on 2 L at home, diastolic CHF, RV failure, pulmonary hypertension seen by CHF team as an outpatient, chronic hypotension on midodrine, prior PE on anticoagulation, chronic kidney disease stage IIIa with baseline creatinine 1.3-1.7, BPH, presents to the hospital with complaints of shortness of breath and weakness.  He has been having GI symptoms with nausea, vomiting, diarrhea for 3 days.  Over the past Joshua prior to admission was having increased shortness of breath, cough.  He was febrile in the ED, chest x-ray showed new patchy opacities from the mid to lower left lung, suspicious for pneumonia.  He was started on antibiotics. COVID/influenza and respiratory virus panel negative. While in the hospital the patient's family requested a consult with the heart failure team.  Assessment and Plan:   Sepsis secondary to possible aspiration pneumonia versus  Community-acquired pneumonia Acute on chronic hypoxemic respiratory failure COPD w/o exacerbation -Pulse ox was 68% on 2 L when EMS - he has been weaned down to 3 L of oxygen and pulse ox is 95- 100% therefore he can be weaned back to his baseline of 2 L - Has improved with azithromycin and ceftriaxone-we will discharge home with azithromycin for a total of 5 days  Chronic diastolic heart failure, chronic RV failure - Torsemide initially held, 1 dose of Lasix IV given on 2/17 and then torsemide resumed yesterday-he is euvolemic  Chronic hypotension - cont Midodrine  AKI- CKD 2 - Cr 1.92 has improved to 1.20  Pyuria - asymptomatic       Consultants: heart failure team Procedures performed: none  Disposition: Home Diet recommendation:  Cardiac diet  DISCHARGE MEDICATION: Allergies as of 10/07/2021       Reactions   Other Swelling   Beer- Swelling    Sunflower Oil Swelling   Sulfa Antibiotics Rash        Medication List     TAKE these medications    Adempas 1.5 MG Tabs Generic drug: Riociguat Take 2 mg by mouth in the morning, at noon, and at bedtime.   albuterol 108 (90 Base) MCG/ACT inhaler Commonly known as: VENTOLIN HFA Inhale 1 puff into the lungs every 4 (four) hours as needed for shortness of breath.   allopurinol 100 MG tablet Commonly known as: ZYLOPRIM Take 100 mg by mouth every morning.   apixaban 5 MG Tabs tablet Commonly known as: ELIQUIS Take 1 tablet (5 mg total) by mouth 2 (two) times daily.   Azelastine  HCl 0.15 % Soln Place 2 sprays into the nose daily as needed (allergies).   azithromycin 250 MG tablet Commonly known as: ZITHROMAX Take 2 tablets (500 mg total) by mouth once for 1 dose. Start taking on: October 08, 2021   cefUROXime 500 MG tablet Commonly known as: CEFTIN Take 1 tablet (500 mg total) by mouth 2 (two) times daily with a meal. Start taking on: October 08, 2021   Cinnamon 500 MG capsule Take 500 mg by mouth  every morning.   Ensure Take 237 mLs by mouth daily.   Farxiga 10 MG Tabs tablet Generic drug: dapagliflozin propanediol Take 10 mg by mouth daily.   finasteride 5 MG tablet Commonly known as: PROSCAR Take 5 mg by mouth at bedtime.   gabapentin 100 MG capsule Commonly known as: NEURONTIN Take 300 mg by mouth 3 (three) times daily.   magnesium oxide 400 MG tablet Commonly known as: MAG-OX Take 400 mg by mouth daily.   metoCLOPramide 5 MG tablet Commonly known as: REGLAN Take 5 mg by mouth 3 (three) times daily before meals.   midodrine 5 MG tablet Commonly known as: PROAMATINE Take 3 tablets (15 mg total) by mouth 3 (three) times daily with meals.   multivitamin with minerals Tabs tablet Take 1 tablet by mouth daily.   ondansetron 4 MG tablet Commonly known as: ZOFRAN Take 1 tablet (4 mg total) by mouth every 6 (six) hours as needed for nausea.   OXYGEN Inhale 2 L into the lungs continuous.   pantoprazole 40 MG tablet Commonly known as: PROTONIX Take 40 mg by mouth every morning.   potassium chloride 10 MEQ tablet Commonly known as: KLOR-CON TAKE 2 TABLETS BY MOUTH 2 TIMES DAILY.   Tiotropium Bromide-Olodaterol 2.5-2.5 MCG/ACT Aers Inhale 2 puffs into the lungs daily.   torsemide 20 MG tablet Commonly known as: DEMADEX Take 2 tablets (40 mg total) by mouth daily.        Follow-up Information     [name], DO Follow up.   Specialty: Family Medicine Contact information: [address] [place] [address] [phone]         Sherren Mocha, MD .   Specialty: Cardiology Contact information: [phone] [place] [address] [place] Follow up.   Why: [place] will call to arrange initial appt                Discharge Exam: Filed Weights   10/05/21 0400 10/06/21 0402 10/07/21 0122  Weight: 86 kg 86.5 kg 86.6 kg       Condition at discharge: good  The results of significant diagnostics from this hospitalization (including imaging, microbiology, ancillary and laboratory) are listed below for reference.   Imaging Studies: DG Chest Port 1 View  Result Date: 10/04/2021 CLINICAL DATA:  Worsening dyspnea, nausea, vomiting, diarrhea, questionable sepsis EXAM: PORTABLE CHEST 1 VIEW COMPARISON:  04/24/2021 chest radiograph. FINDINGS: Stable cardiomediastinal silhouette with mild cardiomegaly. No pneumothorax. No pleural effusion. Patchy opacity throughout the mid to lower left lung is new. IMPRESSION: New patchy opacity throughout the mid to lower left lung, suspicious for pneumonia. Recommend follow-up chest radiographs to resolution. Electronically Signed   By: Ilona Sorrel M.D.   On: 10/04/2021 09:41    Microbiology: Results for orders placed or performed during the hospital encounter of 10/04/21  Resp Panel by RT-PCR (Flu A&B, Covid) Nasopharyngeal  Swab     Status: None   Collection Time: 10/04/21  9:19 AM   Specimen: Nasopharyngeal Swab; Nasopharyngeal(NP) swabs in vial transport medium  Result Value Ref Range Status   SARS Coronavirus 2 by RT PCR NEGATIVE NEGATIVE Final    Comment: (NOTE) SARS-CoV-2 target nucleic acids are NOT DETECTED.  The SARS-CoV-2 RNA is generally detectable in upper respiratory specimens during the acute phase of infection. The lowest concentration of SARS-CoV-2 viral copies this assay can detect is 138 copies/mL. A negative result does not preclude SARS-Cov-2 infection and should not be used as the sole basis for treatment or other patient management decisions. A negative result may occur with  improper specimen collection/handling, submission of specimen other than nasopharyngeal swab, presence of viral mutation(s) within the areas targeted by this assay, and inadequate number of viral copies(<138 copies/mL). A negative result must be combined with clinical  observations, patient history, and epidemiological information. The expected result is Negative.  Fact Sheet for Patients:  EntrepreneurPulse.com.au  Fact Sheet for Healthcare Providers:  IncredibleEmployment.be  This test is no t yet approved or cleared by the [place] FDA and  has been authorized for detection and/or diagnosis of SARS-CoV-2 by FDA under an Emergency Use Authorization (EUA). This EUA will remain  in effect (meaning this test can be used) for the duration of the COVID-19 declaration under Section 564(b)(1) of the Act, 21 U.S.C.section 360bbb-3(b)(1), unless the authorization is terminated  or revoked sooner.       Influenza A by PCR NEGATIVE NEGATIVE Final   Influenza B by PCR NEGATIVE NEGATIVE Final    Comment: (NOTE) The Xpert Xpress SARS-CoV-2/FLU/RSV plus assay is intended as an aid in the diagnosis of influenza from Nasopharyngeal swab specimens and should not be used as a sole basis for treatment. Nasal washings and aspirates are unacceptable for Xpert Xpress SARS-CoV-2/FLU/RSV testing.  Fact Sheet for Patients: EntrepreneurPulse.com.au  Fact Sheet for Healthcare Providers: IncredibleEmployment.be  This test is not yet approved or cleared by the [place] FDA and has been authorized for detection and/or diagnosis of SARS-CoV-2 by FDA under an Emergency Use Authorization (EUA). This EUA will remain in effect (meaning this test can be used) for the duration of the COVID-19 declaration under Section 564(b)(1) of the Act, 21 U.S.C. section 360bbb-3(b)(1), unless the authorization is terminated or revoked.  Performed at [facility], [place] [address], [place], [place] [address]   Urine Culture     Status: Abnormal   Collection Time: 10/04/21  9:19 AM   Specimen: In/Out Cath Urine  Result Value Ref Range Status   Specimen Description IN/OUT CATH URINE  Final   Special  Requests   Final    NONE Performed at [facility], [place] [address] [place], [place] [address]    Culture >=100,000 COLONIES/mL ENTEROCOCCUS FAECALIS (A)  Final   Report Status 10/06/2021 FINAL  Final   Organism ID, Bacteria ENTEROCOCCUS FAECALIS (A)  Final      Susceptibility   Enterococcus faecalis - MIC*    AMPICILLIN <=2 SENSITIVE Sensitive     NITROFURANTOIN <=16 SENSITIVE Sensitive     VANCOMYCIN 1 SENSITIVE Sensitive     * >=100,000 COLONIES/mL ENTEROCOCCUS FAECALIS  Blood Culture (routine x 2)     Status: None (Preliminary result)   Collection Time: 10/04/21  9:20 AM   Specimen: BLOOD LEFT WRIST  Result Value Ref Range Status   Specimen Description BLOOD LEFT WRIST  Final   Special Requests  Final    BOTTLES DRAWN AEROBIC AND ANAEROBIC Blood Culture results may not be optimal due to an inadequate volume of blood received in culture bottles   Culture   Final    NO GROWTH 3 DAYS Performed at [facility], [place] [address], [place], [place] [address]    Report Status PENDING  Incomplete  Blood Culture (routine x 2)     Status: None (Preliminary result)   Collection Time: 10/04/21  9:25 AM   Specimen: BLOOD RIGHT FOREARM  Result Value Ref Range Status   Specimen Description BLOOD RIGHT FOREARM  Final   Special Requests   Final    BOTTLES DRAWN AEROBIC AND ANAEROBIC Blood Culture results may not be optimal due to an inadequate volume of blood received in culture bottles   Culture   Final    NO GROWTH 3 DAYS Performed at [facility], [place] [address], [place], [place] [address]    Report Status PENDING  Incomplete  Respiratory (~20 pathogens) panel by PCR     Status: None   Collection Time: 10/04/21 12:24 PM   Specimen: Nasopharyngeal Swab; Respiratory  Result Value Ref Range Status   Adenovirus NOT DETECTED NOT DETECTED Final   Coronavirus 229E NOT DETECTED NOT DETECTED Final    Comment: (NOTE) The Coronavirus on the Respiratory Panel, DOES NOT test for  the novel  Coronavirus (2019 nCoV)    Coronavirus HKU1 NOT DETECTED NOT DETECTED Final   Coronavirus NL63 NOT DETECTED NOT DETECTED Final   Coronavirus OC43 NOT DETECTED NOT DETECTED Final   Metapneumovirus NOT DETECTED NOT DETECTED Final   Rhinovirus / Enterovirus NOT DETECTED NOT DETECTED Final   Influenza A NOT DETECTED NOT DETECTED Final   Influenza B NOT DETECTED NOT DETECTED Final   Parainfluenza Virus 1 NOT DETECTED NOT DETECTED Final   Parainfluenza Virus 2 NOT DETECTED NOT DETECTED Final   Parainfluenza Virus 3 NOT DETECTED NOT DETECTED Final   Parainfluenza Virus 4 NOT DETECTED NOT DETECTED Final   Respiratory Syncytial Virus NOT DETECTED NOT DETECTED Final   Bordetella pertussis NOT DETECTED NOT DETECTED Final   Bordetella Parapertussis NOT DETECTED NOT DETECTED Final   Chlamydophila pneumoniae NOT DETECTED NOT DETECTED Final   Mycoplasma pneumoniae NOT DETECTED NOT DETECTED Final    Comment: Performed at [facility], [place]. [address], [place], [place] [address]    Labs: CBC: Recent Labs  Lab 10/04/21 0920 10/04/21 1008 10/05/21 0412 10/06/21 0339  WBC 14.3*  --  13.5* 8.2  NEUTROABS 12.6*  --   --   --   HGB 11.6* 12.2* 9.8* 10.7*  HCT 36.2* 36.0* 29.2* 32.6*  MCV 90.3  --  88.0 87.6  PLT 230  --  174 984   Basic Metabolic Panel: Recent Labs  Lab 10/04/21 0920 10/04/21 1008 10/05/21 0412 10/06/21 0339 10/07/21 0436  NA 138 138 135 133* 134*  K 4.9 3.5 3.4* 3.5 3.8  CL 95*  --  96* 94* 96*  CO2 33*  --  _0 GLUCOSE 125*  --  84 92 90  BUN 34*  --  28* 21 16  CREATININE 1.92*  --  1.44* 1.23 1.20  CALCIUM 9.5  --  9.3 9.3 9.4   Liver Function Tests: Recent Labs  Lab 10/04/21 0920 10/05/21 0412  AST 44* 17  ALT 20 10  ALKPHOS 80 56  BILITOT 1.9* 0.8  PROT 7.0 5.6*  ALBUMIN 3.3* 2.5*  CBG: No results for input(s): GLUCAP in the last 168 hours.  Discharge time spent: greater than 30 minutes.  Signed: Debbe Odea, MD [facility] 10/07/2021

## 2021-10-08 ENCOUNTER — Telehealth (HOSPITAL_COMMUNITY): Payer: Self-pay | Admitting: Pharmacist

## 2021-10-08 DIAGNOSIS — Z7901 Long term (current) use of anticoagulants: Secondary | ICD-10-CM | POA: Diagnosis not present

## 2021-10-08 DIAGNOSIS — M1030 Gout due to renal impairment, unspecified site: Secondary | ICD-10-CM | POA: Diagnosis not present

## 2021-10-08 DIAGNOSIS — R8281 Pyuria: Secondary | ICD-10-CM | POA: Diagnosis not present

## 2021-10-08 DIAGNOSIS — I272 Pulmonary hypertension, unspecified: Secondary | ICD-10-CM | POA: Diagnosis not present

## 2021-10-08 DIAGNOSIS — J9621 Acute and chronic respiratory failure with hypoxia: Secondary | ICD-10-CM | POA: Diagnosis not present

## 2021-10-08 DIAGNOSIS — J440 Chronic obstructive pulmonary disease with acute lower respiratory infection: Secondary | ICD-10-CM | POA: Diagnosis not present

## 2021-10-08 DIAGNOSIS — Z9981 Dependence on supplemental oxygen: Secondary | ICD-10-CM | POA: Diagnosis not present

## 2021-10-08 DIAGNOSIS — N138 Other obstructive and reflux uropathy: Secondary | ICD-10-CM | POA: Diagnosis not present

## 2021-10-08 DIAGNOSIS — N401 Enlarged prostate with lower urinary tract symptoms: Secondary | ICD-10-CM | POA: Diagnosis not present

## 2021-10-08 DIAGNOSIS — Z9181 History of falling: Secondary | ICD-10-CM | POA: Diagnosis not present

## 2021-10-08 DIAGNOSIS — I130 Hypertensive heart and chronic kidney disease with heart failure and stage 1 through stage 4 chronic kidney disease, or unspecified chronic kidney disease: Secondary | ICD-10-CM | POA: Diagnosis not present

## 2021-10-08 DIAGNOSIS — J189 Pneumonia, unspecified organism: Secondary | ICD-10-CM | POA: Diagnosis not present

## 2021-10-08 DIAGNOSIS — K219 Gastro-esophageal reflux disease without esophagitis: Secondary | ICD-10-CM | POA: Diagnosis not present

## 2021-10-08 DIAGNOSIS — I50812 Chronic right heart failure: Secondary | ICD-10-CM | POA: Diagnosis not present

## 2021-10-08 DIAGNOSIS — M1990 Unspecified osteoarthritis, unspecified site: Secondary | ICD-10-CM | POA: Diagnosis not present

## 2021-10-08 DIAGNOSIS — E875 Hyperkalemia: Secondary | ICD-10-CM | POA: Diagnosis not present

## 2021-10-08 DIAGNOSIS — I9589 Other hypotension: Secondary | ICD-10-CM | POA: Diagnosis not present

## 2021-10-08 DIAGNOSIS — Z86711 Personal history of pulmonary embolism: Secondary | ICD-10-CM | POA: Diagnosis not present

## 2021-10-08 DIAGNOSIS — N1831 Chronic kidney disease, stage 3a: Secondary | ICD-10-CM | POA: Diagnosis not present

## 2021-10-08 DIAGNOSIS — I5032 Chronic diastolic (congestive) heart failure: Secondary | ICD-10-CM | POA: Diagnosis not present

## 2021-10-08 DIAGNOSIS — Z87891 Personal history of nicotine dependence: Secondary | ICD-10-CM | POA: Diagnosis not present

## 2021-10-08 DIAGNOSIS — G4730 Sleep apnea, unspecified: Secondary | ICD-10-CM | POA: Diagnosis not present

## 2021-10-08 MED ORDER — RIOCIGUAT 2 MG PO TABS
2.0000 mg | ORAL_TABLET | Freq: Three times a day (TID) | ORAL | 11 refills | Status: DC
Start: 2021-10-08 — End: 2022-08-30

## 2021-10-08 NOTE — Telephone Encounter (Signed)
Adempas prescription updated to reflect patients current dose of 2 mg TID. New prescription sent to [facility].  Audry Riles, PharmD, BCPS, BCCP, CPP Heart Failure Clinic Pharmacist [phone]

## 2021-10-09 LAB — CULTURE, BLOOD (ROUTINE X 2)
Culture: NO GROWTH
Culture: NO GROWTH

## 2021-10-10 ENCOUNTER — Ambulatory Visit (INDEPENDENT_AMBULATORY_CARE_PROVIDER_SITE_OTHER): Payer: Medicare Other | Admitting: Podiatry

## 2021-10-10 ENCOUNTER — Other Ambulatory Visit: Payer: Self-pay

## 2021-10-10 ENCOUNTER — Encounter: Payer: Self-pay | Admitting: Podiatry

## 2021-10-10 DIAGNOSIS — Z20828 Contact with and (suspected) exposure to other viral communicable diseases: Secondary | ICD-10-CM | POA: Diagnosis not present

## 2021-10-10 DIAGNOSIS — D689 Coagulation defect, unspecified: Secondary | ICD-10-CM

## 2021-10-10 DIAGNOSIS — B351 Tinea unguium: Secondary | ICD-10-CM

## 2021-10-10 DIAGNOSIS — M79609 Pain in unspecified limb: Secondary | ICD-10-CM

## 2021-10-10 NOTE — Progress Notes (Signed)
Subjective:  Patient ID: Joshua [name], male    DOB: 12-May-1936,   MRN: [id number]  Chief Complaint  Patient presents with   Nail Problem     Routine foot care     86 y.o. male presents for elongated thickened and painful toenails. Patient relates he is unable to trim them himself. He  is not diabetic but does have a history of CHF and is on blood thinners. Currently taking eliquis . Denies any other pedal complaints. Denies n/v/f/c.   Past Medical History:  Diagnosis Date   Arthritis    BPH (benign prostatic hyperplasia)    COPD (chronic obstructive pulmonary disease) (HCC)    Folliculitis    posterior scalp per office visit note of Dr Maudie Mercury 07/20/2014     GERD (gastroesophageal reflux disease)    Hypertension    PE (pulmonary thromboembolism) ([place])    Phlebitis    right arm  at least 20 years ago    Pneumonia    hx of pneumonia as a child    Sleep apnea     Objective:  Physical Exam: Vascular: DP/PT pulses 2/4 bilateral. CFT <3 seconds. Normal hair growth on digits. No edema.  Skin. No lacerations or abrasions bilateral feet. Nails 1-5 are thickened discolored and elongated with subungual debris. Hyperkeratotic cored lesion noted to dorsal fourth digit on right.  Musculoskeletal: MMT 5/5 bilateral lower extremities in DF, PF, Inversion and Eversion. Deceased ROM in DF of ankle joint.  Neurological: Sensation intact to light touch.   Assessment:   1. Onychomycosis   2. Pain due to onychomycosis of nail   3. Coagulation disorder ([place])      Plan:  Patient was evaluated and treated and all questions answered. -Discussed and educated patient on foot care, especially with  regards to the vascular, neurological and musculoskeletal systems.  -Discussed supportive shoes at all times and checking feet regularly.  -Mechanically debrided all nails 1-5 bilateral using sterile nail nipper and filed with dremel without incident  -Hyperkeratotic corn trimmed as courtesy.   -Answered all patient questions -Patient to return  in 3 months for at risk foot care -Patient advised to call the office if any problems or questions arise in the meantime.   Lorenda Peck, DPM

## 2021-10-11 DIAGNOSIS — J440 Chronic obstructive pulmonary disease with acute lower respiratory infection: Secondary | ICD-10-CM | POA: Diagnosis not present

## 2021-10-11 DIAGNOSIS — I272 Pulmonary hypertension, unspecified: Secondary | ICD-10-CM | POA: Diagnosis not present

## 2021-10-11 DIAGNOSIS — I130 Hypertensive heart and chronic kidney disease with heart failure and stage 1 through stage 4 chronic kidney disease, or unspecified chronic kidney disease: Secondary | ICD-10-CM | POA: Diagnosis not present

## 2021-10-11 DIAGNOSIS — I5032 Chronic diastolic (congestive) heart failure: Secondary | ICD-10-CM | POA: Diagnosis not present

## 2021-10-11 DIAGNOSIS — J9621 Acute and chronic respiratory failure with hypoxia: Secondary | ICD-10-CM | POA: Diagnosis not present

## 2021-10-11 DIAGNOSIS — J189 Pneumonia, unspecified organism: Secondary | ICD-10-CM | POA: Diagnosis not present

## 2021-10-15 ENCOUNTER — Telehealth (HOSPITAL_COMMUNITY): Payer: Self-pay | Admitting: *Deleted

## 2021-10-15 DIAGNOSIS — J9621 Acute and chronic respiratory failure with hypoxia: Secondary | ICD-10-CM | POA: Diagnosis not present

## 2021-10-15 DIAGNOSIS — I272 Pulmonary hypertension, unspecified: Secondary | ICD-10-CM | POA: Diagnosis not present

## 2021-10-15 DIAGNOSIS — I130 Hypertensive heart and chronic kidney disease with heart failure and stage 1 through stage 4 chronic kidney disease, or unspecified chronic kidney disease: Secondary | ICD-10-CM | POA: Diagnosis not present

## 2021-10-15 DIAGNOSIS — J440 Chronic obstructive pulmonary disease with acute lower respiratory infection: Secondary | ICD-10-CM | POA: Diagnosis not present

## 2021-10-15 DIAGNOSIS — I5032 Chronic diastolic (congestive) heart failure: Secondary | ICD-10-CM | POA: Diagnosis not present

## 2021-10-15 DIAGNOSIS — J189 Pneumonia, unspecified organism: Secondary | ICD-10-CM | POA: Diagnosis not present

## 2021-10-15 NOTE — Telephone Encounter (Signed)
Start midodrine 5 mg tid.

## 2021-10-15 NOTE — Telephone Encounter (Signed)
Wrist bp cuff measuring systolic bp 19'C-02'C pt is asymptomatic.

## 2021-10-15 NOTE — Telephone Encounter (Signed)
Joshua [name] w/[facility] called to report pt has continued to have low bp readings 75/50 yesterday and 83/56 today.Joshua [name] said they spoke with Joshua [name] on 2/7 and was told the change with Adempas could be causing low bp and that she would speak with a provider and follow up. Joshua [name] said she was just reaching back out because she had not heard back and pts bp continues to run low. Pt is asymptomatic. Joshua [name] attempted to contact Joshua [name] on 2/7 but their office was closed see note below.   "Spoke w/Alicia, RN she reports pt's BP has been running low with SBP in the 80s. Today was 81/55, 2/3= 78/54, 2/1= 81/52, 1/29= 83/56, She wanted Joshua [name] to be aware incase we need to adjust medications, I asked if pt was symptomatic and she stated she hadn't spoken w/pt so she is unsure, BP readings sent to them electronically through BP cuff they provided to pt. Advised we will contact pt and then will discuss w/Joshua [name].   I called and spoke w/pt, he states he has been feeling fine, denies dizziness, lightheadedness, increased fatigue. He states BP is only low on the BP device pcp office gave him and this is the 2nd device they have given him as the 1st one didn't work right either. Pt states he uses his wrist BP cuff daily and readings are in 90-100s (this is pt's usual range). Advised pt to continue using his BP cuff and if it's running in 80s or he is feeling symptomatic to call us, pt agreeable.   Attempted to call Joshua Putt, RN back with info but their office is closed."  Routed to [place] for advice

## 2021-10-15 NOTE — Telephone Encounter (Signed)
If his wrist cuff measures 90s-100s and not dizzy, no changes.

## 2021-10-16 DIAGNOSIS — K219 Gastro-esophageal reflux disease without esophagitis: Secondary | ICD-10-CM | POA: Diagnosis not present

## 2021-10-16 DIAGNOSIS — E119 Type 2 diabetes mellitus without complications: Secondary | ICD-10-CM | POA: Diagnosis not present

## 2021-10-16 DIAGNOSIS — J449 Chronic obstructive pulmonary disease, unspecified: Secondary | ICD-10-CM | POA: Diagnosis not present

## 2021-10-17 MED ORDER — MIDODRINE HCL 10 MG PO TABS: 15.0000 mg | ORAL_TABLET | Freq: Three times a day (TID) | ORAL | 3 refills | Status: DC

## 2021-10-17 NOTE — Telephone Encounter (Signed)
Increase to midodrine 10 tid ?

## 2021-10-17 NOTE — Telephone Encounter (Signed)
Pt aware new rx sent to pharm. ?

## 2021-10-17 NOTE — Telephone Encounter (Signed)
Pt currently takes midodrine 73m tid.  ?

## 2021-10-18 DIAGNOSIS — J440 Chronic obstructive pulmonary disease with acute lower respiratory infection: Secondary | ICD-10-CM | POA: Diagnosis not present

## 2021-10-18 DIAGNOSIS — J9621 Acute and chronic respiratory failure with hypoxia: Secondary | ICD-10-CM | POA: Diagnosis not present

## 2021-10-18 DIAGNOSIS — I272 Pulmonary hypertension, unspecified: Secondary | ICD-10-CM | POA: Diagnosis not present

## 2021-10-18 DIAGNOSIS — I5032 Chronic diastolic (congestive) heart failure: Secondary | ICD-10-CM | POA: Diagnosis not present

## 2021-10-18 DIAGNOSIS — I130 Hypertensive heart and chronic kidney disease with heart failure and stage 1 through stage 4 chronic kidney disease, or unspecified chronic kidney disease: Secondary | ICD-10-CM | POA: Diagnosis not present

## 2021-10-18 DIAGNOSIS — J189 Pneumonia, unspecified organism: Secondary | ICD-10-CM | POA: Diagnosis not present

## 2021-10-22 DIAGNOSIS — I272 Pulmonary hypertension, unspecified: Secondary | ICD-10-CM | POA: Diagnosis not present

## 2021-10-22 DIAGNOSIS — J9621 Acute and chronic respiratory failure with hypoxia: Secondary | ICD-10-CM | POA: Diagnosis not present

## 2021-10-22 DIAGNOSIS — J440 Chronic obstructive pulmonary disease with acute lower respiratory infection: Secondary | ICD-10-CM | POA: Diagnosis not present

## 2021-10-22 DIAGNOSIS — I130 Hypertensive heart and chronic kidney disease with heart failure and stage 1 through stage 4 chronic kidney disease, or unspecified chronic kidney disease: Secondary | ICD-10-CM | POA: Diagnosis not present

## 2021-10-22 DIAGNOSIS — I5032 Chronic diastolic (congestive) heart failure: Secondary | ICD-10-CM | POA: Diagnosis not present

## 2021-10-22 DIAGNOSIS — J189 Pneumonia, unspecified organism: Secondary | ICD-10-CM | POA: Diagnosis not present

## 2021-10-25 DIAGNOSIS — J9621 Acute and chronic respiratory failure with hypoxia: Secondary | ICD-10-CM | POA: Diagnosis not present

## 2021-10-25 DIAGNOSIS — J440 Chronic obstructive pulmonary disease with acute lower respiratory infection: Secondary | ICD-10-CM | POA: Diagnosis not present

## 2021-10-25 DIAGNOSIS — I272 Pulmonary hypertension, unspecified: Secondary | ICD-10-CM | POA: Diagnosis not present

## 2021-10-25 DIAGNOSIS — J189 Pneumonia, unspecified organism: Secondary | ICD-10-CM | POA: Diagnosis not present

## 2021-10-25 DIAGNOSIS — I5032 Chronic diastolic (congestive) heart failure: Secondary | ICD-10-CM | POA: Diagnosis not present

## 2021-10-25 DIAGNOSIS — I130 Hypertensive heart and chronic kidney disease with heart failure and stage 1 through stage 4 chronic kidney disease, or unspecified chronic kidney disease: Secondary | ICD-10-CM | POA: Diagnosis not present

## 2021-10-29 DIAGNOSIS — J9621 Acute and chronic respiratory failure with hypoxia: Secondary | ICD-10-CM | POA: Diagnosis not present

## 2021-10-29 DIAGNOSIS — J440 Chronic obstructive pulmonary disease with acute lower respiratory infection: Secondary | ICD-10-CM | POA: Diagnosis not present

## 2021-10-29 DIAGNOSIS — J159 Unspecified bacterial pneumonia: Secondary | ICD-10-CM | POA: Diagnosis not present

## 2021-10-29 DIAGNOSIS — Z79899 Other long term (current) drug therapy: Secondary | ICD-10-CM | POA: Diagnosis not present

## 2021-10-29 DIAGNOSIS — R634 Abnormal weight loss: Secondary | ICD-10-CM | POA: Diagnosis not present

## 2021-10-29 DIAGNOSIS — I130 Hypertensive heart and chronic kidney disease with heart failure and stage 1 through stage 4 chronic kidney disease, or unspecified chronic kidney disease: Secondary | ICD-10-CM | POA: Diagnosis not present

## 2021-10-29 DIAGNOSIS — Z87440 Personal history of urinary (tract) infections: Secondary | ICD-10-CM | POA: Diagnosis not present

## 2021-10-29 DIAGNOSIS — I5032 Chronic diastolic (congestive) heart failure: Secondary | ICD-10-CM | POA: Diagnosis not present

## 2021-10-29 DIAGNOSIS — J189 Pneumonia, unspecified organism: Secondary | ICD-10-CM | POA: Diagnosis not present

## 2021-10-29 DIAGNOSIS — K5900 Constipation, unspecified: Secondary | ICD-10-CM | POA: Diagnosis not present

## 2021-10-29 DIAGNOSIS — R112 Nausea with vomiting, unspecified: Secondary | ICD-10-CM | POA: Diagnosis not present

## 2021-10-29 DIAGNOSIS — Z09 Encounter for follow-up examination after completed treatment for conditions other than malignant neoplasm: Secondary | ICD-10-CM | POA: Diagnosis not present

## 2021-10-29 DIAGNOSIS — I272 Pulmonary hypertension, unspecified: Secondary | ICD-10-CM | POA: Diagnosis not present

## 2021-10-31 DIAGNOSIS — I272 Pulmonary hypertension, unspecified: Secondary | ICD-10-CM | POA: Diagnosis not present

## 2021-10-31 DIAGNOSIS — I5032 Chronic diastolic (congestive) heart failure: Secondary | ICD-10-CM | POA: Diagnosis not present

## 2021-10-31 DIAGNOSIS — I130 Hypertensive heart and chronic kidney disease with heart failure and stage 1 through stage 4 chronic kidney disease, or unspecified chronic kidney disease: Secondary | ICD-10-CM | POA: Diagnosis not present

## 2021-10-31 DIAGNOSIS — J189 Pneumonia, unspecified organism: Secondary | ICD-10-CM | POA: Diagnosis not present

## 2021-10-31 DIAGNOSIS — J440 Chronic obstructive pulmonary disease with acute lower respiratory infection: Secondary | ICD-10-CM | POA: Diagnosis not present

## 2021-10-31 DIAGNOSIS — J9621 Acute and chronic respiratory failure with hypoxia: Secondary | ICD-10-CM | POA: Diagnosis not present

## 2021-11-07 DIAGNOSIS — N138 Other obstructive and reflux uropathy: Secondary | ICD-10-CM | POA: Diagnosis not present

## 2021-11-07 DIAGNOSIS — R634 Abnormal weight loss: Secondary | ICD-10-CM | POA: Diagnosis not present

## 2021-11-07 DIAGNOSIS — I9589 Other hypotension: Secondary | ICD-10-CM | POA: Diagnosis not present

## 2021-11-07 DIAGNOSIS — K219 Gastro-esophageal reflux disease without esophagitis: Secondary | ICD-10-CM | POA: Diagnosis not present

## 2021-11-07 DIAGNOSIS — K5900 Constipation, unspecified: Secondary | ICD-10-CM | POA: Diagnosis not present

## 2021-11-07 DIAGNOSIS — I50812 Chronic right heart failure: Secondary | ICD-10-CM | POA: Diagnosis not present

## 2021-11-07 DIAGNOSIS — R112 Nausea with vomiting, unspecified: Secondary | ICD-10-CM | POA: Diagnosis not present

## 2021-11-07 DIAGNOSIS — Z9981 Dependence on supplemental oxygen: Secondary | ICD-10-CM | POA: Diagnosis not present

## 2021-11-07 DIAGNOSIS — N1831 Chronic kidney disease, stage 3a: Secondary | ICD-10-CM | POA: Diagnosis not present

## 2021-11-07 DIAGNOSIS — Z86711 Personal history of pulmonary embolism: Secondary | ICD-10-CM | POA: Diagnosis not present

## 2021-11-07 DIAGNOSIS — R066 Hiccough: Secondary | ICD-10-CM | POA: Diagnosis not present

## 2021-11-07 DIAGNOSIS — J440 Chronic obstructive pulmonary disease with acute lower respiratory infection: Secondary | ICD-10-CM | POA: Diagnosis not present

## 2021-11-07 DIAGNOSIS — M1990 Unspecified osteoarthritis, unspecified site: Secondary | ICD-10-CM | POA: Diagnosis not present

## 2021-11-07 DIAGNOSIS — M1030 Gout due to renal impairment, unspecified site: Secondary | ICD-10-CM | POA: Diagnosis not present

## 2021-11-07 DIAGNOSIS — I272 Pulmonary hypertension, unspecified: Secondary | ICD-10-CM | POA: Diagnosis not present

## 2021-11-07 DIAGNOSIS — G4730 Sleep apnea, unspecified: Secondary | ICD-10-CM | POA: Diagnosis not present

## 2021-11-07 DIAGNOSIS — Z7901 Long term (current) use of anticoagulants: Secondary | ICD-10-CM | POA: Diagnosis not present

## 2021-11-07 DIAGNOSIS — N401 Enlarged prostate with lower urinary tract symptoms: Secondary | ICD-10-CM | POA: Diagnosis not present

## 2021-11-07 DIAGNOSIS — Z9181 History of falling: Secondary | ICD-10-CM | POA: Diagnosis not present

## 2021-11-07 DIAGNOSIS — R8281 Pyuria: Secondary | ICD-10-CM | POA: Diagnosis not present

## 2021-11-07 DIAGNOSIS — I5032 Chronic diastolic (congestive) heart failure: Secondary | ICD-10-CM | POA: Diagnosis not present

## 2021-11-07 DIAGNOSIS — J189 Pneumonia, unspecified organism: Secondary | ICD-10-CM | POA: Diagnosis not present

## 2021-11-07 DIAGNOSIS — I130 Hypertensive heart and chronic kidney disease with heart failure and stage 1 through stage 4 chronic kidney disease, or unspecified chronic kidney disease: Secondary | ICD-10-CM | POA: Diagnosis not present

## 2021-11-07 DIAGNOSIS — J9621 Acute and chronic respiratory failure with hypoxia: Secondary | ICD-10-CM | POA: Diagnosis not present

## 2021-11-07 DIAGNOSIS — E875 Hyperkalemia: Secondary | ICD-10-CM | POA: Diagnosis not present

## 2021-11-07 DIAGNOSIS — Z87891 Personal history of nicotine dependence: Secondary | ICD-10-CM | POA: Diagnosis not present

## 2021-11-09 DIAGNOSIS — I272 Pulmonary hypertension, unspecified: Secondary | ICD-10-CM | POA: Diagnosis not present

## 2021-11-09 DIAGNOSIS — J189 Pneumonia, unspecified organism: Secondary | ICD-10-CM | POA: Diagnosis not present

## 2021-11-09 DIAGNOSIS — J440 Chronic obstructive pulmonary disease with acute lower respiratory infection: Secondary | ICD-10-CM | POA: Diagnosis not present

## 2021-11-09 DIAGNOSIS — I130 Hypertensive heart and chronic kidney disease with heart failure and stage 1 through stage 4 chronic kidney disease, or unspecified chronic kidney disease: Secondary | ICD-10-CM | POA: Diagnosis not present

## 2021-11-09 DIAGNOSIS — J9621 Acute and chronic respiratory failure with hypoxia: Secondary | ICD-10-CM | POA: Diagnosis not present

## 2021-11-09 DIAGNOSIS — I5032 Chronic diastolic (congestive) heart failure: Secondary | ICD-10-CM | POA: Diagnosis not present

## 2021-11-09 NOTE — Telephone Encounter (Signed)
Prior Authorization for titration sent to [facility] via Phone. NO PA REQUIRED ?

## 2021-11-12 ENCOUNTER — Other Ambulatory Visit: Payer: Self-pay | Admitting: Physician Assistant

## 2021-11-12 NOTE — Telephone Encounter (Signed)
Patient is scheduled for CPAP Titration on 12/10/21. ?Patient understands his titration study will be done at [facility] sleep lab. ?Patient has declined his titration he does not want a cpap. ?

## 2021-11-16 DIAGNOSIS — J9621 Acute and chronic respiratory failure with hypoxia: Secondary | ICD-10-CM | POA: Diagnosis not present

## 2021-11-16 DIAGNOSIS — K219 Gastro-esophageal reflux disease without esophagitis: Secondary | ICD-10-CM | POA: Diagnosis not present

## 2021-11-16 DIAGNOSIS — E119 Type 2 diabetes mellitus without complications: Secondary | ICD-10-CM | POA: Diagnosis not present

## 2021-11-16 DIAGNOSIS — I272 Pulmonary hypertension, unspecified: Secondary | ICD-10-CM | POA: Diagnosis not present

## 2021-11-16 DIAGNOSIS — I5032 Chronic diastolic (congestive) heart failure: Secondary | ICD-10-CM | POA: Diagnosis not present

## 2021-11-16 DIAGNOSIS — J189 Pneumonia, unspecified organism: Secondary | ICD-10-CM | POA: Diagnosis not present

## 2021-11-16 DIAGNOSIS — J449 Chronic obstructive pulmonary disease, unspecified: Secondary | ICD-10-CM | POA: Diagnosis not present

## 2021-11-16 DIAGNOSIS — J440 Chronic obstructive pulmonary disease with acute lower respiratory infection: Secondary | ICD-10-CM | POA: Diagnosis not present

## 2021-11-16 DIAGNOSIS — I130 Hypertensive heart and chronic kidney disease with heart failure and stage 1 through stage 4 chronic kidney disease, or unspecified chronic kidney disease: Secondary | ICD-10-CM | POA: Diagnosis not present

## 2021-11-19 DIAGNOSIS — R634 Abnormal weight loss: Secondary | ICD-10-CM | POA: Diagnosis not present

## 2021-11-19 DIAGNOSIS — R630 Anorexia: Secondary | ICD-10-CM | POA: Diagnosis not present

## 2021-11-19 DIAGNOSIS — J159 Unspecified bacterial pneumonia: Secondary | ICD-10-CM | POA: Diagnosis not present

## 2021-11-19 DIAGNOSIS — R0789 Other chest pain: Secondary | ICD-10-CM | POA: Diagnosis not present

## 2021-11-21 ENCOUNTER — Other Ambulatory Visit: Payer: Self-pay | Admitting: Family Medicine

## 2021-11-21 DIAGNOSIS — J189 Pneumonia, unspecified organism: Secondary | ICD-10-CM

## 2021-11-21 DIAGNOSIS — J449 Chronic obstructive pulmonary disease, unspecified: Secondary | ICD-10-CM

## 2021-11-22 DIAGNOSIS — J440 Chronic obstructive pulmonary disease with acute lower respiratory infection: Secondary | ICD-10-CM | POA: Diagnosis not present

## 2021-11-22 DIAGNOSIS — I5032 Chronic diastolic (congestive) heart failure: Secondary | ICD-10-CM | POA: Diagnosis not present

## 2021-11-22 DIAGNOSIS — I130 Hypertensive heart and chronic kidney disease with heart failure and stage 1 through stage 4 chronic kidney disease, or unspecified chronic kidney disease: Secondary | ICD-10-CM | POA: Diagnosis not present

## 2021-11-22 DIAGNOSIS — J189 Pneumonia, unspecified organism: Secondary | ICD-10-CM | POA: Diagnosis not present

## 2021-11-22 DIAGNOSIS — J9621 Acute and chronic respiratory failure with hypoxia: Secondary | ICD-10-CM | POA: Diagnosis not present

## 2021-11-22 DIAGNOSIS — I272 Pulmonary hypertension, unspecified: Secondary | ICD-10-CM | POA: Diagnosis not present

## 2021-11-24 ENCOUNTER — Other Ambulatory Visit: Payer: Self-pay | Admitting: Pulmonary Disease

## 2021-11-28 DIAGNOSIS — J9621 Acute and chronic respiratory failure with hypoxia: Secondary | ICD-10-CM | POA: Diagnosis not present

## 2021-11-28 DIAGNOSIS — J189 Pneumonia, unspecified organism: Secondary | ICD-10-CM | POA: Diagnosis not present

## 2021-11-28 DIAGNOSIS — J440 Chronic obstructive pulmonary disease with acute lower respiratory infection: Secondary | ICD-10-CM | POA: Diagnosis not present

## 2021-11-28 DIAGNOSIS — I130 Hypertensive heart and chronic kidney disease with heart failure and stage 1 through stage 4 chronic kidney disease, or unspecified chronic kidney disease: Secondary | ICD-10-CM | POA: Diagnosis not present

## 2021-11-28 DIAGNOSIS — I272 Pulmonary hypertension, unspecified: Secondary | ICD-10-CM | POA: Diagnosis not present

## 2021-11-28 DIAGNOSIS — I5032 Chronic diastolic (congestive) heart failure: Secondary | ICD-10-CM | POA: Diagnosis not present

## 2021-12-03 DIAGNOSIS — J9621 Acute and chronic respiratory failure with hypoxia: Secondary | ICD-10-CM | POA: Diagnosis not present

## 2021-12-03 DIAGNOSIS — J189 Pneumonia, unspecified organism: Secondary | ICD-10-CM | POA: Diagnosis not present

## 2021-12-03 DIAGNOSIS — I272 Pulmonary hypertension, unspecified: Secondary | ICD-10-CM | POA: Diagnosis not present

## 2021-12-03 DIAGNOSIS — I130 Hypertensive heart and chronic kidney disease with heart failure and stage 1 through stage 4 chronic kidney disease, or unspecified chronic kidney disease: Secondary | ICD-10-CM | POA: Diagnosis not present

## 2021-12-03 DIAGNOSIS — J440 Chronic obstructive pulmonary disease with acute lower respiratory infection: Secondary | ICD-10-CM | POA: Diagnosis not present

## 2021-12-03 DIAGNOSIS — I5032 Chronic diastolic (congestive) heart failure: Secondary | ICD-10-CM | POA: Diagnosis not present

## 2021-12-05 DIAGNOSIS — K5900 Constipation, unspecified: Secondary | ICD-10-CM | POA: Diagnosis not present

## 2021-12-05 DIAGNOSIS — B3781 Candidal esophagitis: Secondary | ICD-10-CM | POA: Diagnosis not present

## 2021-12-06 NOTE — Progress Notes (Signed)
PCP: [name], DO ?Cardiology: Dr. Burt Knack ?HF Cardiology: Dr. Aundra Dubin ? ?86 y.o. with history of HFPEF, prior PE, and pulmonary hypertension was referred by Dr. Gasper Sells for evaluation of pulmonary hypertension.  Patient had a PE in 2016.  He is on apixaban. He has a long history of diastolic CHF.  Most recent echo showed a significant component of RV failure with EF 55-60%, IV septum flattened, severe RV enlargement, severely decreased RV function, PASP 57 mmHg.  CTA chest in 4/22 showed no acute PE and mild emphysema, but V/Q scan in 5/22 was suggestive of chronic PE in the right middle lobe.  RHC in 4/22 showed normal filling pressures with moderate PAH. He additionally has chronic hiccups followed by Dr. Benson Norway, now on gabapentin. PFTs in 6/22 showed mixed picture with severe obstruction, moderate restriction, and severely decreased DLCO.  ? ?Admitted 8/25-8/30/22 with A/C HF exacerbation. He was aggressively diuresed with lasix/metolazone, had unna boots, farxiga added.  Midodrine was increased for hypotension. Hospitalization c/b AKI on CKD III. ? ?Readmitted 9/1-9/12/22 with shock (mixed septic and cardiogenic), likely 2/2 to aspiration pneumonia and A/C CHF.  Suspect possible gut translocation with ileus/partial SBO; CT abdomen with "nutcracker phenomenon".  He was started on NE + antibiotics. General surgery consulted and felt not to be a surgical candidate. Diuretics initially held due to over-diuresis and shock. Eventually able to wean pressors off, restart PO torsemide and midodrine. Hospitalization c/b transaminitis, delirium, and BPH with urinary retention, requiring foley catheter. Palliative care was consulted for [place] and patient DNR/DNI. PT/OT recommended HH. He was discharged home, weight 220 lbs. ? ?EGD 11/22 showed esophagitis, hiatal hernia and yeast.  ? ?Acute visit 07/17/21 for weakness/falls. He had a syncopal episode in clinic. Labs showed hypokalemia of 3.3, SCr elevated at 2.13,  ReDs 27%. Wilder Glade stopped, torsemide decreased to 40 mg daily and compression hose and abdominal binder ordered. 3-day Zio placed to evaluate for possible arrhythmogenic cause of symptoms.  Zio monitor showed 19 second run of VT.  ? ?Follow up 1/23, rare atypical chest pain. NYHA III, weight continued to trend down. ? ?Admitted 2/23 with PNA and a/c CHF. Seen by PMT and remained full code. ? ?Today he returns for HF follow up with wife. Overall feeling fine. Having occasional atypical chest pain occurring at night, occurs every other week. Resolves with drinking water. He is not overly dyspneic walking on flat ground, SOB with stairs. Denies palpitations, abnormal bleeding, dizziness, edema, or PND/Orthopnea. Appetite poor. No fever or chills. Weight at home 171 pounds. Taking all medications, daughter helps with pillbox. Finished PT, not wearing compression hose or abdominal binder consistently. ? ?ECG (personally reviewed): NSR rBBB, QRS 160 msec, no acute ST-T changes ? ?Labs (4/22): K 4.8, creatinine 1.67 ?Labs (6/22): K 3.8, creatinine 1.72, ANA negative, RF 39.6 but CCP negative, SCL-70 negative, BNP 1231, HIV negative ?Labs (7/22): K 3.8, creatine 1.67 ?Labs (8/22): K 3.6, creatinine 1.6, hgb 15.3  ?Labs (9/22): K 5.0, creatinine 1.2, hgb 14.6 ?Labs (10/22): K 3.4, creatinine 1.37 ?Labs (10/22): K 3.5, creatinine 1.34 ?Labs (12/22): K 4.5, creatinine 1.81 ?Labs (2/23): K 3.8, creatinine 1.20 ? ?6 minute walk (7/22): 213 m ?6 minute walk (10/22): 274 m (oxygen saturation dropped to 70s on 2L [place]) ? ?PMH: ?1. HFPEF: With prominent RV failure.   ?- Echo (2/22): EF 55-60%, IV septum flattened, severe RV enlargement, severely decreased RV function, PASP 57 mmHg.  ?2. Venous thromboembolic disease: PE in 3536.   ?- Venous  dopplers (4/22): No DVT.  ?- CTA chest (4/22): No PE.  ?- V/Q scan 5/22 with perfusion defect in the RML consistent with chronic PE.  ?3. OSA: Waiting on CPAP.  ?4. HTN ?5. COPD: Prior smoker.   ?-  CTA chest in 4/22 showed no PE, mild emphysema.  ?- PFTs (6/22) with severe obstruction, moderate restriction, severely decreased DLCO ?6. Pulmonary hypertension: RHC (4/22) with mean RA 5, PA 65/19 mean 36, mean PCWP 5, CI 2.19, PVR 6.1 WU, PAPi 9.2.  ?7. Chronic hiccups ?8. BPH: Has to in and out cath at times. ?9. Syncope: Zio 12/22 with 19 second VT run.  ?10. Chronic hiccups ?11. 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 ? ?Social History  ? ?Socioeconomic History  ? Marital status: Married  ?  Spouse name: Arville Go  ? Number of children: Not on file  ? Years of education: Not on file  ? Highest education level: Not on file  ?Occupational History  ? Not on file  ?Tobacco Use  ? Smoking status: Former  ?  Packs/day: 1.00  ?  Years: 50.00  ?  Pack years: 50.00  ?  Types: Cigarettes  ?  Start date: 42  ?  Quit date: 08/19/2006  ?  Years since quitting: 15.3  ? Smokeless tobacco: Never  ?Vaping Use  ? Vaping Use: Never used  ?Substance and Sexual Activity  ? Alcohol use: Not Currently  ?  Alcohol/week: 6.0 standard drinks  ?  Types: 6 Shots of liquor per week  ? Drug use: No  ? Sexual activity: Not on file  ?Other Topics Concern  ? Not on file  ?Social History Narrative  ? Not on file  ? ?Social Determinants of Health  ? ?Financial Resource Strain: Not on file  ?Food Insecurity: No Food Insecurity  ? Worried About Charity fundraiser in the Last Year: Never true  ? Ran Out of Food in the Last Year: Never true  ?Transportation Needs: No Transportation Needs  ? Lack of Transportation (Medical): No  ? Lack of Transportation (Non-Medical): No  ?Physical Activity: Not on file  ?Stress: No Stress Concern Present  ? Feeling of Stress : Only a little  ?Social Connections: Not on file  ?Intimate Partner Violence: Not At Risk  ? Fear of Current or Ex-Partner: No  ? Emotionally Abused: No  ? Physically Abused: No  ? Sexually Abused: No  ? ?Family History   ?Problem Relation Age of Onset  ? Heart attack Brother 18  ? ?ROS: All systems reviewed and negative except as per HPI.  ? ?Current Outpatient Medications  ?Medication Sig Dispense Refill  ? albuterol (VENTOLIN HFA) 108 (90 Base) MCG/ACT inhaler Inhale 1 puff into the lungs every 4 (four) hours as needed for shortness of breath.    ? allopurinol (ZYLOPRIM) 100 MG tablet Take 100 mg by mouth every morning.    ? apixaban (ELIQUIS) 5 MG TABS tablet Take 1 tablet (5 mg total) by mouth 2 (two) times daily. 60 tablet   ? Azelastine HCl 0.15 % SOLN Place 2 sprays into the nose daily as needed (allergies).    ? cefUROXime (CEFTIN) 500 MG tablet Take 1 tablet (500 mg total) by mouth 2 (two) times daily with a meal. 2 tablet 0  ? Cinnamon 500 MG capsule Take 500 mg by mouth every morning.    ? Ensure (ENSURE) Take 237 mLs by mouth daily.    ?  FARXIGA 10 MG TABS tablet Take 10 mg by mouth daily.    ? finasteride (PROSCAR) 5 MG tablet Take 5 mg by mouth at bedtime.    ? gabapentin (NEURONTIN) 100 MG capsule Take 300 mg by mouth 3 (three) times daily.    ? magnesium oxide (MAG-OX) 400 MG tablet Take 400 mg by mouth daily.    ? metoCLOPramide (REGLAN) 5 MG tablet Take 5 mg by mouth 3 (three) times daily before meals.    ? midodrine (PROAMATINE) 10 MG tablet Take 1.5 tablets (15 mg total) by mouth 3 (three) times daily with meals. 90 tablet 3  ? Multiple Vitamin (MULTIVITAMIN WITH MINERALS) TABS tablet Take 1 tablet by mouth daily. 30 tablet 0  ? ondansetron (ZOFRAN) 4 MG tablet Take 1 tablet (4 mg total) by mouth every 6 (six) hours as needed for nausea. 20 tablet 0  ? OXYGEN Inhale 2 L into the lungs continuous.    ? pantoprazole (PROTONIX) 40 MG tablet Take 40 mg by mouth every morning.    ? potassium chloride (KLOR-CON) 10 MEQ tablet TAKE 2 TABLETS BY MOUTH 2 TIMES DAILY. (Patient taking differently: Take 20 mEq by mouth 2 (two) times daily.) 30 tablet 6  ? Riociguat 2 MG TABS Take 2 mg by mouth in the morning, at noon, and  at bedtime. 90 tablet 11  ? STIOLTO RESPIMAT 2.5-2.5 MCG/ACT AERS INHALE 2 PUFFS BY MOUTH INTO THE LUNGS DAILY 4 g 5  ? torsemide (DEMADEX) 20 MG tablet Take 2 tablets (40 mg total) by mouth daily. 60 table

## 2021-12-07 DIAGNOSIS — Z20822 Contact with and (suspected) exposure to covid-19: Secondary | ICD-10-CM | POA: Diagnosis not present

## 2021-12-10 ENCOUNTER — Encounter (HOSPITAL_BASED_OUTPATIENT_CLINIC_OR_DEPARTMENT_OTHER): Payer: Medicare Other | Admitting: Cardiology

## 2021-12-11 ENCOUNTER — Encounter (HOSPITAL_COMMUNITY): Payer: Self-pay

## 2021-12-11 ENCOUNTER — Ambulatory Visit (HOSPITAL_COMMUNITY)
Admission: RE | Admit: 2021-12-11 | Discharge: 2021-12-11 | Disposition: A | Discharge status: Home / Self Care | Payer: Medicare Other | Source: Ambulatory Visit | Attending: Family Medicine | Admitting: Family Medicine

## 2021-12-11 VITALS — BP 112/72 | HR 90 | Wt 174.8 lb

## 2021-12-11 DIAGNOSIS — I272 Pulmonary hypertension, unspecified: Secondary | ICD-10-CM | POA: Diagnosis not present

## 2021-12-11 DIAGNOSIS — J449 Chronic obstructive pulmonary disease, unspecified: Secondary | ICD-10-CM

## 2021-12-11 DIAGNOSIS — I50812 Chronic right heart failure: Secondary | ICD-10-CM | POA: Diagnosis not present

## 2021-12-11 DIAGNOSIS — Z7901 Long term (current) use of anticoagulants: Secondary | ICD-10-CM | POA: Insufficient documentation

## 2021-12-11 DIAGNOSIS — I2782 Chronic pulmonary embolism: Secondary | ICD-10-CM | POA: Insufficient documentation

## 2021-12-11 DIAGNOSIS — Z86711 Personal history of pulmonary embolism: Secondary | ICD-10-CM | POA: Diagnosis not present

## 2021-12-11 DIAGNOSIS — K209 Esophagitis, unspecified without bleeding: Secondary | ICD-10-CM | POA: Diagnosis not present

## 2021-12-11 DIAGNOSIS — I130 Hypertensive heart and chronic kidney disease with heart failure and stage 1 through stage 4 chronic kidney disease, or unspecified chronic kidney disease: Secondary | ICD-10-CM | POA: Insufficient documentation

## 2021-12-11 DIAGNOSIS — I5032 Chronic diastolic (congestive) heart failure: Secondary | ICD-10-CM | POA: Insufficient documentation

## 2021-12-11 DIAGNOSIS — R5381 Other malaise: Secondary | ICD-10-CM | POA: Diagnosis not present

## 2021-12-11 DIAGNOSIS — J439 Emphysema, unspecified: Secondary | ICD-10-CM | POA: Diagnosis not present

## 2021-12-11 DIAGNOSIS — G4733 Obstructive sleep apnea (adult) (pediatric): Secondary | ICD-10-CM | POA: Insufficient documentation

## 2021-12-11 DIAGNOSIS — I2724 Chronic thromboembolic pulmonary hypertension: Secondary | ICD-10-CM | POA: Insufficient documentation

## 2021-12-11 DIAGNOSIS — K449 Diaphragmatic hernia without obstruction or gangrene: Secondary | ICD-10-CM | POA: Insufficient documentation

## 2021-12-11 DIAGNOSIS — Z79899 Other long term (current) drug therapy: Secondary | ICD-10-CM | POA: Insufficient documentation

## 2021-12-11 DIAGNOSIS — Z7189 Other specified counseling: Secondary | ICD-10-CM | POA: Diagnosis not present

## 2021-12-11 DIAGNOSIS — K2100 Gastro-esophageal reflux disease with esophagitis, without bleeding: Secondary | ICD-10-CM

## 2021-12-11 DIAGNOSIS — N183 Chronic kidney disease, stage 3 unspecified: Secondary | ICD-10-CM | POA: Diagnosis not present

## 2021-12-11 DIAGNOSIS — Z9981 Dependence on supplemental oxygen: Secondary | ICD-10-CM | POA: Diagnosis not present

## 2021-12-11 LAB — CBC
HCT: 36.2 % — ABNORMAL LOW (ref 39.0–52.0)
Hemoglobin: 11.5 g/dL — ABNORMAL LOW (ref 13.0–17.0)
MCH: 28 pg (ref 26.0–34.0)
MCHC: 31.8 g/dL (ref 30.0–36.0)
MCV: 88.1 fL (ref 80.0–100.0)
Platelets: 214 10*3/uL (ref 150–400)
RBC: 4.11 MIL/uL — ABNORMAL LOW (ref 4.22–5.81)
RDW: 14.3 % (ref 11.5–15.5)
WBC: 4.3 10*3/uL (ref 4.0–10.5)
nRBC: 0 % (ref 0.0–0.2)

## 2021-12-11 LAB — BASIC METABOLIC PANEL
Anion gap: 9 (ref 5–15)
BUN: 41 mg/dL — ABNORMAL HIGH (ref 8–23)
CO2: 35 mmol/L — ABNORMAL HIGH (ref 22–32)
Calcium: 10.4 mg/dL — ABNORMAL HIGH (ref 8.9–10.3)
Chloride: 93 mmol/L — ABNORMAL LOW (ref 98–111)
Creatinine, Ser: 2.04 mg/dL — ABNORMAL HIGH (ref 0.61–1.24)
GFR, Estimated: 32 mL/min — ABNORMAL LOW (ref >60)
Glucose, Bld: 102 mg/dL — ABNORMAL HIGH (ref 70–99)
Potassium: 4 mmol/L (ref 3.5–5.1)
Sodium: 137 mmol/L (ref 135–145)

## 2021-12-11 LAB — BRAIN NATRIURETIC PEPTIDE: B Natriuretic Peptide: 52.7 pg/mL (ref 0.0–100.0)

## 2021-12-11 NOTE — Patient Instructions (Signed)
Thank you for coming in today ? ?Labs were done today, if any labs are abnormal the clinic will call you ?No news is good news ? ?You have been referred to palliative care they will contact you for further details  ? ?Your physician recommends that you schedule a follow-up appointment in:  ?2-3 months with Dr. Aundra Dubin ? ?At the [facility], you and your health needs are our priority. As part of our continuing mission to provide you with exceptional heart care, we have created designated Provider Care Teams. These Care Teams include your primary Cardiologist (physician) and Advanced Practice Providers (APPs- Physician Assistants and Nurse Practitioners) who all work together to provide you with the care you need, when you need it.  ? ?You may see any of the following providers on your designated Care Team at your next follow up: ?Dr Glori Bickers ?Dr Loralie Champagne ?Darrick Grinder, NP ?Lyda Jester, PA ?Jessica Milford,NP ?Marlyce Huge, PA ?Audry Riles, PharmD ? ? ?Please be sure to bring in all your medications bottles to every appointment.  ? ?If you have any questions or concerns before your next appointment please send [place] a message through [place] or call our office at [phone].   ? ?TO LEAVE A MESSAGE FOR THE NURSE SELECT OPTION 2, PLEASE LEAVE A MESSAGE INCLUDING: ?YOUR NAME ?DATE OF BIRTH ?CALL BACK NUMBER ?REASON FOR CALL**this is important as we prioritize the call backs ? ?YOU WILL RECEIVE A CALL BACK THE SAME DAY AS LONG AS YOU CALL BEFORE 4:00 PM ? ?

## 2021-12-12 ENCOUNTER — Telehealth: Payer: Self-pay

## 2021-12-12 NOTE — Telephone Encounter (Signed)
Spoke with patient and scheduled a Mychart Palliative Consult for 12/20/21 @ 2:30 PM.  ? ?Consent obtained; updated Netsmart, Team List and Epic.  ? ?

## 2021-12-13 ENCOUNTER — Ambulatory Visit
Admission: RE | Admit: 2021-12-13 | Discharge: 2021-12-13 | Disposition: A | Discharge status: Home / Self Care | Payer: Medicare Other | Source: Ambulatory Visit | Attending: Family Medicine | Admitting: Family Medicine

## 2021-12-13 DIAGNOSIS — K449 Diaphragmatic hernia without obstruction or gangrene: Secondary | ICD-10-CM | POA: Diagnosis not present

## 2021-12-13 DIAGNOSIS — J432 Centrilobular emphysema: Secondary | ICD-10-CM | POA: Diagnosis not present

## 2021-12-13 DIAGNOSIS — I2510 Atherosclerotic heart disease of native coronary artery without angina pectoris: Secondary | ICD-10-CM | POA: Diagnosis not present

## 2021-12-13 DIAGNOSIS — Z8701 Personal history of pneumonia (recurrent): Secondary | ICD-10-CM | POA: Diagnosis not present

## 2021-12-13 DIAGNOSIS — J189 Pneumonia, unspecified organism: Secondary | ICD-10-CM

## 2021-12-13 DIAGNOSIS — J449 Chronic obstructive pulmonary disease, unspecified: Secondary | ICD-10-CM

## 2021-12-13 MED ORDER — IOPAMIDOL (ISOVUE-300) INJECTION 61%
60.0000 mL | Freq: Once | INTRAVENOUS | Status: AC | PRN
  Administered 2021-12-13: 60 mL via INTRAVENOUS

## 2021-12-16 DIAGNOSIS — E119 Type 2 diabetes mellitus without complications: Secondary | ICD-10-CM | POA: Diagnosis not present

## 2021-12-16 DIAGNOSIS — J449 Chronic obstructive pulmonary disease, unspecified: Secondary | ICD-10-CM | POA: Diagnosis not present

## 2021-12-16 DIAGNOSIS — I5032 Chronic diastolic (congestive) heart failure: Secondary | ICD-10-CM | POA: Diagnosis not present

## 2021-12-16 DIAGNOSIS — K219 Gastro-esophageal reflux disease without esophagitis: Secondary | ICD-10-CM | POA: Diagnosis not present

## 2021-12-19 ENCOUNTER — Other Ambulatory Visit: Payer: Self-pay | Admitting: Gastroenterology

## 2021-12-19 ENCOUNTER — Other Ambulatory Visit (HOSPITAL_COMMUNITY): Payer: Self-pay | Admitting: *Deleted

## 2021-12-19 DIAGNOSIS — R634 Abnormal weight loss: Secondary | ICD-10-CM

## 2021-12-19 MED ORDER — MAGNESIUM OXIDE 400 MG PO TABS: 400.0000 mg | ORAL_TABLET | Freq: Every day | ORAL | 3 refills | Status: DC

## 2021-12-20 ENCOUNTER — Encounter: Payer: Self-pay | Admitting: Nurse Practitioner

## 2021-12-20 ENCOUNTER — Telehealth: Payer: Medicare Other | Admitting: Nurse Practitioner

## 2021-12-20 DIAGNOSIS — I272 Pulmonary hypertension, unspecified: Secondary | ICD-10-CM

## 2021-12-20 DIAGNOSIS — R0602 Shortness of breath: Secondary | ICD-10-CM | POA: Diagnosis not present

## 2021-12-20 DIAGNOSIS — Z515 Encounter for palliative care: Secondary | ICD-10-CM

## 2021-12-20 DIAGNOSIS — R630 Anorexia: Secondary | ICD-10-CM

## 2021-12-20 NOTE — Progress Notes (Signed)
? ? ?Manufacturing engineer ?Community Palliative Care Consult Note ?Telephone: [phone]  ?Fax: [phone]  ? ?Date of encounter: 12/20/21 ?10:30 PM ?PATIENT NAME: Joshua [name] ?[address] ?[place] [address]   ?[phone] (home)  ?DOB: September 01, 1935 ?MRN: [id number] ?PRIMARY CARE PROVIDER:    ?Joshua Morning, DO,  ?[address] ?[place] [address] ?[phone] ? ?REFERRING PROVIDER:   ?Joshua Morning, DO ?[address] ?[address] ?[place],  [place] [address] ?[phone] ? ?RESPONSIBLE PARTY:    ?Contact Information   ? ? Name Relation Home Work Mobile  ? [place],Joshua [name] [phone]  [phone]  ? Joshua [name] Daughter   [phone]  ? ?  ? ? ?Due to the COVID-19 crisis, this visit was done via telemedicine from my office and it was initiated and consent by this patient and or family. ? ?I connected with Joshua and  Joshua [name] OR PROXY on 12/20/21 by a video enabled telemedicine application and verified that I am speaking with the correct person using two identifiers. ?  ?I discussed the limitations of evaluation and management by telemedicine. The patient expressed understanding and agreed to proceed. Palliative Care was asked to follow this patient by consultation request of  Joshua Morning, DO to address advance care planning and complex medical decision making. This is the initial visit.                        ?ASSESSMENT AND PLAN / RECOMMENDATIONS:  ?Symptom Management/Plan: ?1. Advance Care Planning;  Ongoing discussions ? ?2. Goals of Care: Goals include to maximize quality of life and symptom management. Our advance care planning conversation included a discussion about:    ?The value and importance of advance care planning  ?Exploration of personal, cultural or spiritual beliefs that might influence medical decisions  ?Exploration of goals of care in the event of a sudden injury or illness  ?Identification and preparation of a healthcare  agent  ?Review and updating or creation of an advance directive document. ? ?3. Anorexia; pending CT scan, discussed weight loss, nutrition education done, supplements. We talked about eating patterns.  ? ?03/07/2021 weight 231 lbs ?06/12/2021 weight 211 lbs ?12/11/2021 weight 174 lbs ? ?4. Shortness of breath stable, continue to monitor respiratory status, O2,  ? ?5. Palliative care encounter; Palliative care encounter; Palliative medicine team will continue to support patient, patient's family, and medical team. Visit consisted of counseling and education dealing with the complex and emotionally intense issues of symptom management and palliative care in the setting of serious and potentially life-threatening illness ? ?Follow up Palliative Care Visit: Palliative care will continue to follow for complex medical decision making, advance care planning, and clarification of goals. Return 4 weeks or prn. ? ?I spent 65 minutes providing this consultation. More than 50% of the time in this consultation was spent in counseling and care coordination. ?PPS: 50% ? ?Chief Complaint: Initial palliative consult for complex medical decision making ? ?HISTORY OF PRESENT ILLNESS:  Joshua [name] is a 86 y.o. year old male  with multiple medical problems including Pulmonary HTN, diastolic CHF, h/o PE (3235), hypotension requiring midodrine, HTN, aortic atherosclerosis, OSA, h/o hiatial hernia, chronic respiratory failure with hypoixa, supplemental O2, COPD, h/p CAP, GErD, arthritis, CKD, BPH, hearing loss, gout. I connected by telemedicine my chart video with Joshua and Joshua [name]. We talked about purpose of pc visit, both in agreement. We talked about past medical history, life review, family and social  history. We talked about symptoms including ros, pain and sob which Joshua. [name] endorses currently comfortable. We talked about appetite with ongoing weight loss, daily food intake, supplements, nutritional education done. We talked  about pending CT scan. We talked about fears about loosing weight. We talked about sleep patterns, which Joshua. Dambach endorses sleeping well. We talked about medical goals, role pc in poc. We talked about f/u pc visit, scheduled. Therapeutic listening, emotional support provided. Questions answered.  ? ?History obtained from review of EMR, discussion with Joshua and Joshua. [name].  ?I reviewed available labs, medications, imaging, studies and related documents from the EMR.  Records reviewed and summarized above.  ? ?ROS ?10 point system reviewed all negative except +anorexia, +weight loss ? ?Physical Exam: ?deferred ?CURRENT PROBLEM LIST:  ?Patient Active Problem List  ? Diagnosis Date Noted  ? Palliative care by specialist   ? Hypokalemia 10/05/2021  ? Abnormal gait 10/05/2021  ? Gastroesophageal reflux disease without esophagitis 10/05/2021  ? Hiatal hernia 10/05/2021  ? Hiccoughs 10/05/2021  ? Oxygen dependent 10/05/2021  ? Prediabetes 10/05/2021  ? Pure hypercholesterolemia 10/05/2021  ? CAP (community acquired pneumonia) 10/04/2021  ? CKD (chronic kidney disease) stage 2, GFR 60-89 ml/min 10/04/2021  ? Gout 10/04/2021  ? Acute on chronic respiratory failure with hypoxia ([place]) 10/04/2021  ? Asymmetrical sensorineural hearing loss 08/29/2021  ? Nasal dryness 08/29/2021  ? Ear stuffiness, bilateral 05/09/2021  ? Acute renal failure superimposed on stage 3 chronic kidney disease ([place]) 04/19/2021  ? Intractable nausea and vomiting 04/19/2021  ? Hyperkalemia 04/19/2021  ? Transaminitis 04/19/2021  ? Elevated troponin 04/19/2021  ? 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 Chronic diastolic heart failure ([place]) 04/12/2021  ? Acute on chronic right-sided heart failure ([place]) 04/12/2021  ? Acute kidney injury superimposed on CKD ([place]) 04/12/2021  ? OSA (obstructive sleep apnea) 12/07/2020  ? Aortic atherosclerosis ([place]) 12/07/2020  ? CHF (congestive heart failure) ([place]) 12/03/2020  ? Syncope and collapse 12/03/2020  ? Chronic right-sided  heart failure ([place]) 12/03/2020  ? Pseudophakia, both eyes 06/07/2019  ? Pulmonary hypertension ([place]) 09/29/2017  ? Benign prostatic hyperplasia with lower urinary tract symptoms 08/25/2017  ? Syncope, vasovagal 08/25/2017  ? Demand ischemia of myocardium ([place]) 08/25/2017  ? Chronic diastolic CHF (congestive heart failure) ([place]) 08/25/2017  ? Arthritis 08/24/2017  ? Essential hypertension 02/16/2015  ? Chronic obstructive pulmonary disease, unspecified ([place]) 12/12/2014  ? Personal history of pulmonary embolism 10/10/2014  ? Chronic respiratory failure ([place]) 10/10/2014  ? S/P knee replacement 09/13/2014  ? ?PAST MEDICAL HISTORY:  ?Active Ambulatory Problems  ?  Diagnosis Date Noted  ? S/P knee replacement 09/13/2014  ? Personal history of pulmonary embolism 10/10/2014  ? Chronic respiratory failure ([place]) 10/10/2014  ? Chronic obstructive pulmonary disease, unspecified ([place]) 12/12/2014  ? Essential hypertension 02/16/2015  ? Arthritis 08/24/2017  ? Benign prostatic hyperplasia with lower urinary tract symptoms 08/25/2017  ? Syncope, vasovagal 08/25/2017  ? Demand ischemia of myocardium ([place]) 08/25/2017  ? Chronic diastolic CHF (congestive heart failure) ([place]) 08/25/2017  ? Pulmonary hypertension ([place]) 09/29/2017  ? CHF (congestive heart failure) ([place]) 12/03/2020  ? Syncope and collapse 12/03/2020  ? Chronic right-sided heart failure ([place]) 12/03/2020  ? OSA (obstructive sleep apnea) 12/07/2020  ? Aortic atherosclerosis ([place]) 12/07/2020  ? Chronic diastolic heart failure ([place]) 04/12/2021  ? Acute on chronic right-sided heart failure ([place]) 04/12/2021  ? Acute kidney injury superimposed on CKD ([place]) 04/12/2021  ? Acute renal failure superimposed on stage 3 chronic kidney disease ([place])  04/19/2021  ? Intractable nausea and vomiting 04/19/2021  ? Hyperkalemia 04/19/2021  ? Transaminitis 04/19/2021  ? Elevated troponin 04/19/2021  ? 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 CAP (community acquired pneumonia) 10/04/2021  ? CKD (chronic  kidney disease) stage 2, GFR 60-89 ml/min 10/04/2021  ? Gout 10/04/2021  ? Acute on chronic respiratory failure with hypoxia ([place]) 10/04/2021  ? Hypokalemia 10/05/2021  ? Abnormal gait 10/05/2021  ? Asymmetric

## 2021-12-21 ENCOUNTER — Telehealth (HOSPITAL_COMMUNITY): Payer: Self-pay | Admitting: Cardiology

## 2021-12-21 DIAGNOSIS — I5032 Chronic diastolic (congestive) heart failure: Secondary | ICD-10-CM

## 2021-12-21 MED ORDER — TORSEMIDE 20 MG PO TABS: 20.0000 mg | ORAL_TABLET | Freq: Every day | ORAL | 3 refills | Status: DC

## 2021-12-21 NOTE — Telephone Encounter (Signed)
-----  Message from Rafael Bihari, [place] sent at 12/11/2021  1:25 PM EDT ----- ?SCr/BUN elevated. Hold torsemide & Farxiga x 2 days. Resume Farxiga at usual dose of 10  mg daily, resume torsemide at reduced dose of 20 mg daily.  ? ?Repeat BMET and BNP in 10-14 days. ?

## 2021-12-21 NOTE — Telephone Encounter (Signed)
Patient called.  Patient aware. Pt reports he is no longer taking farxiga was instructed to discontinue at discharge. Med list update  ?Repeat labs 12/27/21 ?

## 2021-12-24 ENCOUNTER — Ambulatory Visit: Payer: Medicare Other | Admitting: Primary Care

## 2021-12-25 ENCOUNTER — Ambulatory Visit
Admission: RE | Admit: 2021-12-25 | Discharge: 2021-12-25 | Disposition: A | Discharge status: Home / Self Care | Payer: Medicare Other | Source: Ambulatory Visit | Attending: Gastroenterology | Admitting: Gastroenterology

## 2021-12-25 DIAGNOSIS — K449 Diaphragmatic hernia without obstruction or gangrene: Secondary | ICD-10-CM | POA: Diagnosis not present

## 2021-12-25 DIAGNOSIS — K5730 Diverticulosis of large intestine without perforation or abscess without bleeding: Secondary | ICD-10-CM | POA: Diagnosis not present

## 2021-12-25 DIAGNOSIS — K439 Ventral hernia without obstruction or gangrene: Secondary | ICD-10-CM | POA: Diagnosis not present

## 2021-12-25 DIAGNOSIS — R634 Abnormal weight loss: Secondary | ICD-10-CM

## 2021-12-25 MED ORDER — IOPAMIDOL (ISOVUE-300) INJECTION 61%
60.0000 mL | Freq: Once | INTRAVENOUS | Status: AC | PRN
  Administered 2021-12-25: 60 mL via INTRAVENOUS

## 2021-12-26 ENCOUNTER — Encounter: Payer: Self-pay | Admitting: Pulmonary Disease

## 2021-12-26 ENCOUNTER — Ambulatory Visit (INDEPENDENT_AMBULATORY_CARE_PROVIDER_SITE_OTHER): Payer: Medicare Other | Admitting: Pulmonary Disease

## 2021-12-26 VITALS — BP 110/60 | HR 73 | Ht 75.0 in | Wt 171.6 lb

## 2021-12-26 DIAGNOSIS — J432 Centrilobular emphysema: Secondary | ICD-10-CM | POA: Diagnosis not present

## 2021-12-26 DIAGNOSIS — E43 Unspecified severe protein-calorie malnutrition: Secondary | ICD-10-CM

## 2021-12-26 DIAGNOSIS — I272 Pulmonary hypertension, unspecified: Secondary | ICD-10-CM

## 2021-12-26 NOTE — Progress Notes (Signed)
? ?  Subjective:  ? ? Patient ID: Joshua [name], male    DOB: [birth year], 86 y.o.   MRN: [id number] ? ?HPI ? ?86 yo former smoker for FU of COPD, chronic PE & pulmonary hypertension ,being treated as CTEPH ? ?PMh/o PE postop after a knee replacement in 2016. ? ?- hospitalized 03/2021 for CHF exacerbation>> started on Farxiga and midodrine ?He was again hospitalized 9/1 to 04/30/2021 with persistent nausea and vomiting and hypotension, requiring pressors.  CT abdomen/pelvis showed narrowing of duodenum but upper GI series did not show any stricture, there was an abdominal hernia with nonobstructed bowel ?-EGD 06/2021 showed Candida esophagitis, hiatal hernia  ?- "Nutcracker phenomenon:" abrupt narrowing third portion of duodenum as it passes between abdominal aorta and SMV. ? ?Meds-  tried on Breo, anoro & trelegy  none of these gave any symptomatic relief -prefers Spiriva, stiolto ?Started on rociguat 01/2021 ? ?No chief complaint on file. ? ?Accompanied by his 2 daughters.  He continues to lose weight, has now gone down to 171 pounds.  He was hospitalized February 2023 for pneumonia, chest x-ray 10/04/2021 was reviewed which shows left-sided infiltrate. ?He had follow-up CT chest 12/13/2021 which shows severe emphysema but no infiltrates or effusions ?He is compliant with oxygen uses 2 L at rest and more on exertion. ?He had a CT abdomen/pelvis done, ordered by GI which has not been reported yet.  He takes Ensure in the afternoon and is able to eat breakfast and dinner, vomiting has decreased but he has not started gaining weight yet. ?Blood pressure is 110/60, remains on midodrine ? ?Last visit with Dr. Theodora Blow heart failure was reviewed, outpatient palliative care consultation has been requested ? ? ?Significant tests/ events reviewed ? ? ?6 minute walk (10/22): 274 m (oxygen saturation dropped to 70s on 2L [place]) ?  ?02/2021 Ambulatory saturation was 93% at rest and desaturated to 87% on walking, recovered with 3  L of oxygen and maintain while ambulating ?  ?RHC (4/22) with mean RA 5, PA 65/19 mean 36, mean PCWP 5, CI 2.19, PVR 6.1 WU, PAPi 9.2. ?  ?Echo 12/2017 RVSP 39  ?NPSG 02/2018 >> no OSA, 45 mins desatn, TST 5.5 h ?  ?09/15/14 CT chest showed a large bilateral pulmonary embolism with RV dilatation. ?08/2014 Venous Doppler showed no DVT ?  ? 01/29/21- FEV1 51%, no BD response, ratio 61, TLC 77%, DLCO 35%  ?PFTs  11/2014 - FEV1 59%, no BD response, ratio 70, TLC 68%, DLCO 35% ?  ?  ?CT angiogram 08/2017 left upper lobe nodule 9x6 mm. ?CT 03/2018 which showed resolution of nodules ? ?Review of Systems ?neg for any significant sore throat, dysphagia, itching, sneezing, nasal congestion or excess/ purulent secretions, fever, chills, sweats, unintended wt loss, pleuritic or exertional cp, hempoptysis, orthopnea pnd or change in chronic leg swelling. Also denies presyncope, palpitations, heartburn, abdominal pain, nausea, vomiting, diarrhea or change in bowel or urinary habits, dysuria,hematuria, rash, arthralgias, visual complaints, headache, numbness weakness or ataxia. ? ?   ?Objective:  ? Physical Exam ? ?Gen. Pleasant, tall thin man, in no distress ?ENT - no thrush, no pallor/icterus,no post nasal drip ?Neck: No JVD, no thyromegaly, no carotid bruits ?Lungs: no use of accessory muscles, no dullness to percussion, decreased bilateral without rales or rhonchi  ?Cardiovascular: Rhythm regular, heart sounds  normal, no murmurs or gallops, no peripheral edema ?Musculoskeletal: No deformities, no cyanosis or clubbing  ? ? ? ?   ?Assessment & Plan:  ? ? ?

## 2021-12-26 NOTE — Assessment & Plan Note (Signed)
He can continue on 2 L at rest and increase up to 4 L on exertion. ?We will refer him to pulmonary rehab ?

## 2021-12-26 NOTE — Assessment & Plan Note (Signed)
Being treated as CHF, not a candidate for surgery. ?Wonder if there is a component of WHO 3 ? ?

## 2021-12-26 NOTE — Patient Instructions (Signed)
? ?  Continue on stiolto ? ?X refer to pulmonary rehab ?

## 2021-12-26 NOTE — Assessment & Plan Note (Signed)
He has lost more than 50 pounds over the past 9 months ?GI is investigating, he seems to have duodenal narrowing without obvious stricture ?CT abdomen/pelvis results are pending and to be followed by GI. ?He is tolerating Ensure ? ?

## 2021-12-26 NOTE — Assessment & Plan Note (Signed)
Severe emphysema. ?He will continue on Stiolto , we can consider triple therapy in the future. ?Continue to use albuterol as needed for rescue ?

## 2021-12-27 ENCOUNTER — Ambulatory Visit (HOSPITAL_COMMUNITY)
Admission: RE | Admit: 2021-12-27 | Discharge: 2021-12-27 | Disposition: A | Discharge status: Home / Self Care | Payer: Medicare Other | Source: Ambulatory Visit | Attending: Internal Medicine | Admitting: Internal Medicine

## 2021-12-27 DIAGNOSIS — I5032 Chronic diastolic (congestive) heart failure: Secondary | ICD-10-CM | POA: Insufficient documentation

## 2021-12-27 LAB — BASIC METABOLIC PANEL
Anion gap: 9 (ref 5–15)
BUN: 33 mg/dL — ABNORMAL HIGH (ref 8–23)
CO2: 30 mmol/L (ref 22–32)
Calcium: 11.1 mg/dL — ABNORMAL HIGH (ref 8.9–10.3)
Chloride: 100 mmol/L (ref 98–111)
Creatinine, Ser: 1.53 mg/dL — ABNORMAL HIGH (ref 0.61–1.24)
GFR, Estimated: 45 mL/min — ABNORMAL LOW (ref >60)
Glucose, Bld: 133 mg/dL — ABNORMAL HIGH (ref 70–99)
Potassium: 4.3 mmol/L (ref 3.5–5.1)
Sodium: 139 mmol/L (ref 135–145)

## 2021-12-27 LAB — BRAIN NATRIURETIC PEPTIDE: B Natriuretic Peptide: 83.7 pg/mL (ref 0.0–100.0)

## 2021-12-28 ENCOUNTER — Telehealth (HOSPITAL_COMMUNITY): Payer: Self-pay

## 2021-12-28 NOTE — Telephone Encounter (Signed)
Called patient to see if he is interested in the Pulmonary Rehab Program. Patient expressed interest. Explained scheduling process, patient verbalized understanding. ?

## 2021-12-31 ENCOUNTER — Telehealth (HOSPITAL_COMMUNITY): Payer: Self-pay

## 2021-12-31 NOTE — Telephone Encounter (Signed)
Pt insurance is active and benefits verified through Medicare A/B. Co-pay $0.00, DED $236.00/$236.00 met, out of pocket $0.00/$0.00 met, co-insurance 20%. No pre-authorization required. 12/31/21 @ 10:20AM ?  ?2ndary insurance is active and benefits verified through [employer]. Co-pay $0.00, DED $0.00/$0.00 met, out of pocket $0.00/$0.00 met, co-insurance 0%. No pre-authorization required. 12/31/21 @ 10:21AM ?

## 2022-01-02 DIAGNOSIS — R066 Hiccough: Secondary | ICD-10-CM | POA: Diagnosis not present

## 2022-01-02 DIAGNOSIS — R634 Abnormal weight loss: Secondary | ICD-10-CM | POA: Diagnosis not present

## 2022-01-02 DIAGNOSIS — K5900 Constipation, unspecified: Secondary | ICD-10-CM | POA: Diagnosis not present

## 2022-01-04 ENCOUNTER — Other Ambulatory Visit: Payer: Medicare Other | Admitting: Nurse Practitioner

## 2022-01-08 ENCOUNTER — Encounter: Payer: Self-pay | Admitting: Podiatry

## 2022-01-08 ENCOUNTER — Ambulatory Visit (INDEPENDENT_AMBULATORY_CARE_PROVIDER_SITE_OTHER): Payer: Medicare Other | Admitting: Podiatry

## 2022-01-08 DIAGNOSIS — L84 Corns and callosities: Secondary | ICD-10-CM | POA: Diagnosis not present

## 2022-01-08 DIAGNOSIS — D689 Coagulation defect, unspecified: Secondary | ICD-10-CM

## 2022-01-08 DIAGNOSIS — B351 Tinea unguium: Secondary | ICD-10-CM | POA: Diagnosis not present

## 2022-01-08 DIAGNOSIS — M79609 Pain in unspecified limb: Secondary | ICD-10-CM

## 2022-01-08 NOTE — Progress Notes (Signed)
  Subjective:  Patient ID: MISTY RAGO, male    DOB: 1936-03-13,   MRN: [id number]  Chief Complaint  Patient presents with   Nail Problem    Routine foot care     86 y.o. male presents for elongated thickened and painful toenails. Patient relates he is unable to trim them himself. He  is not diabetic but does have a history of CHF and is on blood thinners. Currently taking eliquis . Denies any other pedal complaints. Denies n/v/f/c.   Past Medical History:  Diagnosis Date   Arthritis    BPH (benign prostatic hyperplasia)    COPD (chronic obstructive pulmonary disease) (HCC)    Folliculitis    posterior scalp per office visit note of Dr Maudie Mercury 07/20/2014     GERD (gastroesophageal reflux disease)    Hypertension    PE (pulmonary thromboembolism) ([place])    Phlebitis    right arm  at least 20 years ago    Pneumonia    hx of pneumonia as a child    Sleep apnea     Objective:  Physical Exam: Vascular: DP/PT pulses 2/4 bilateral. CFT <3 seconds. Normal hair growth on digits. No edema.  Skin. No lacerations or abrasions bilateral feet. Nails 1-5 are thickened discolored and elongated with subungual debris. Hyperkeratotic cored lesion noted to dorsal fourth digit on right and plantar fifthe metatarsal on right.  Musculoskeletal: MMT 5/5 bilateral lower extremities in DF, PF, Inversion and Eversion. Deceased ROM in DF of ankle joint.  Neurological: Sensation intact to light touch.   Assessment:   No diagnosis found.    Plan:  Patient was evaluated and treated and all questions answered. -Discussed and educated patient on foot care, especially with  regards to the vascular, neurological and musculoskeletal systems.  -Discussed supportive shoes at all times and checking feet regularly.  -Mechanically debrided all nails 1-5 bilateral using sterile nail nipper and filed with dremel without incident  -Hyperkeratotic corn trimmed without incident.  -Answered all patient  questions -Patient to return  in 3 months for at risk foot care -Patient advised to call the office if any problems or questions arise in the meantime.   Lorenda Peck, DPM

## 2022-01-11 ENCOUNTER — Other Ambulatory Visit: Payer: Medicare Other

## 2022-01-15 ENCOUNTER — Other Ambulatory Visit: Payer: Medicare Other | Admitting: Nurse Practitioner

## 2022-01-16 DIAGNOSIS — K219 Gastro-esophageal reflux disease without esophagitis: Secondary | ICD-10-CM | POA: Diagnosis not present

## 2022-01-16 DIAGNOSIS — E119 Type 2 diabetes mellitus without complications: Secondary | ICD-10-CM | POA: Diagnosis not present

## 2022-01-16 DIAGNOSIS — J449 Chronic obstructive pulmonary disease, unspecified: Secondary | ICD-10-CM | POA: Diagnosis not present

## 2022-01-16 DIAGNOSIS — I5032 Chronic diastolic (congestive) heart failure: Secondary | ICD-10-CM | POA: Diagnosis not present

## 2022-01-31 ENCOUNTER — Other Ambulatory Visit: Payer: Medicare Other | Admitting: Internal Medicine

## 2022-02-05 ENCOUNTER — Other Ambulatory Visit: Payer: Medicare Other | Admitting: Internal Medicine

## 2022-02-10 ENCOUNTER — Other Ambulatory Visit: Payer: Self-pay | Admitting: Physician Assistant

## 2022-02-10 ENCOUNTER — Other Ambulatory Visit (HOSPITAL_COMMUNITY): Payer: Self-pay | Admitting: Cardiology

## 2022-02-13 DIAGNOSIS — R634 Abnormal weight loss: Secondary | ICD-10-CM | POA: Diagnosis not present

## 2022-02-13 DIAGNOSIS — R112 Nausea with vomiting, unspecified: Secondary | ICD-10-CM | POA: Diagnosis not present

## 2022-02-13 DIAGNOSIS — R066 Hiccough: Secondary | ICD-10-CM | POA: Diagnosis not present

## 2022-02-16 ENCOUNTER — Other Ambulatory Visit: Payer: Self-pay | Admitting: Physician Assistant

## 2022-02-17 ENCOUNTER — Other Ambulatory Visit: Payer: Self-pay | Admitting: Internal Medicine

## 2022-02-18 MED ORDER — MAGNESIUM OXIDE 400 MG PO TABS: 400.0000 mg | ORAL_TABLET | Freq: Every day | ORAL | 3 refills | Status: DC

## 2022-02-18 NOTE — Telephone Encounter (Signed)
Prescription refill request for Eliquis received. Indication: PE Last office visit: 12/11/21  Rudi Heap FNP Scr: 1.53 on 12/27/21 Weight: 79.3kg  Based on above findings (age/SCr) Eliquis 54m twice daily should be reduced to 2.520mtwice daily.  Message sent to Dr McAundra Dubino review dosage before refill approved.

## 2022-02-28 ENCOUNTER — Telehealth (HOSPITAL_COMMUNITY): Payer: Self-pay

## 2022-02-28 NOTE — Telephone Encounter (Signed)
Pt insurance is active and benefits verified through Medicare a/b Co-pay 0, DED $226/$226 met, out of pocket 0/0 met, co-insurance 20%. no pre-authorization required. Passport, 02/28/2022_0 Lindi Adie, REF# [id number]  2ndary insurance is active and benefits verified through [facility]. Co-pay 0, DED 0/0 met, out of pocket 0/0 met, co-insurance 0%. No pre-authorization required, Jere/BCBS 02/28/2022_1 :50am, REF# [id number]

## 2022-02-28 NOTE — Telephone Encounter (Signed)
Called patient to see if he was interested in participating in the Pulmonary Rehab Program. Patient stated yes. Patient will come in for orientation on 03/13/2022_0 :00am and will attend the 10:15am exercise class.   Tourist information centre manager.

## 2022-03-12 ENCOUNTER — Telehealth (HOSPITAL_COMMUNITY): Payer: Self-pay | Admitting: *Deleted

## 2022-03-13 ENCOUNTER — Encounter (HOSPITAL_COMMUNITY)
Admission: RE | Admit: 2022-03-13 | Discharge: 2022-03-13 | Disposition: A | Discharge status: Home / Self Care | Payer: Medicare Other | Source: Ambulatory Visit | Attending: Pulmonary Disease | Admitting: Pulmonary Disease

## 2022-03-13 ENCOUNTER — Encounter (HOSPITAL_COMMUNITY): Payer: Self-pay

## 2022-03-13 VITALS — BP 114/70 | Ht 72.0 in | Wt 177.9 lb

## 2022-03-13 DIAGNOSIS — J690 Pneumonitis due to inhalation of food and vomit: Secondary | ICD-10-CM | POA: Diagnosis not present

## 2022-03-13 DIAGNOSIS — Z20822 Contact with and (suspected) exposure to covid-19: Secondary | ICD-10-CM | POA: Diagnosis not present

## 2022-03-13 DIAGNOSIS — K449 Diaphragmatic hernia without obstruction or gangrene: Secondary | ICD-10-CM | POA: Diagnosis not present

## 2022-03-13 DIAGNOSIS — K315 Obstruction of duodenum: Secondary | ICD-10-CM | POA: Diagnosis not present

## 2022-03-13 DIAGNOSIS — I5031 Acute diastolic (congestive) heart failure: Secondary | ICD-10-CM | POA: Diagnosis not present

## 2022-03-13 DIAGNOSIS — I5082 Biventricular heart failure: Secondary | ICD-10-CM | POA: Diagnosis present

## 2022-03-13 DIAGNOSIS — I2782 Chronic pulmonary embolism: Secondary | ICD-10-CM | POA: Diagnosis not present

## 2022-03-13 DIAGNOSIS — N400 Enlarged prostate without lower urinary tract symptoms: Secondary | ICD-10-CM | POA: Diagnosis not present

## 2022-03-13 DIAGNOSIS — G4733 Obstructive sleep apnea (adult) (pediatric): Secondary | ICD-10-CM | POA: Diagnosis present

## 2022-03-13 DIAGNOSIS — I959 Hypotension, unspecified: Secondary | ICD-10-CM | POA: Diagnosis not present

## 2022-03-13 DIAGNOSIS — R42 Dizziness and giddiness: Secondary | ICD-10-CM | POA: Diagnosis not present

## 2022-03-13 DIAGNOSIS — I2609 Other pulmonary embolism with acute cor pulmonale: Secondary | ICD-10-CM | POA: Diagnosis not present

## 2022-03-13 DIAGNOSIS — I700 Atherosclerosis of aorta: Secondary | ICD-10-CM | POA: Diagnosis present

## 2022-03-13 DIAGNOSIS — R945 Abnormal results of liver function studies: Secondary | ICD-10-CM | POA: Diagnosis not present

## 2022-03-13 DIAGNOSIS — R6521 Severe sepsis with septic shock: Secondary | ICD-10-CM | POA: Diagnosis not present

## 2022-03-13 DIAGNOSIS — J449 Chronic obstructive pulmonary disease, unspecified: Secondary | ICD-10-CM

## 2022-03-13 DIAGNOSIS — I714 Abdominal aortic aneurysm, without rupture, unspecified: Secondary | ICD-10-CM | POA: Diagnosis not present

## 2022-03-13 DIAGNOSIS — K7200 Acute and subacute hepatic failure without coma: Secondary | ICD-10-CM | POA: Diagnosis not present

## 2022-03-13 DIAGNOSIS — M79604 Pain in right leg: Secondary | ICD-10-CM | POA: Diagnosis not present

## 2022-03-13 DIAGNOSIS — I272 Pulmonary hypertension, unspecified: Secondary | ICD-10-CM | POA: Diagnosis not present

## 2022-03-13 DIAGNOSIS — I5032 Chronic diastolic (congestive) heart failure: Secondary | ICD-10-CM | POA: Diagnosis not present

## 2022-03-13 DIAGNOSIS — J432 Centrilobular emphysema: Secondary | ICD-10-CM | POA: Insufficient documentation

## 2022-03-13 DIAGNOSIS — E43 Unspecified severe protein-calorie malnutrition: Secondary | ICD-10-CM | POA: Diagnosis not present

## 2022-03-13 DIAGNOSIS — R55 Syncope and collapse: Secondary | ICD-10-CM | POA: Diagnosis present

## 2022-03-13 DIAGNOSIS — J9811 Atelectasis: Secondary | ICD-10-CM | POA: Diagnosis not present

## 2022-03-13 DIAGNOSIS — R0602 Shortness of breath: Secondary | ICD-10-CM | POA: Diagnosis not present

## 2022-03-13 DIAGNOSIS — N1831 Chronic kidney disease, stage 3a: Secondary | ICD-10-CM | POA: Diagnosis present

## 2022-03-13 DIAGNOSIS — I2699 Other pulmonary embolism without acute cor pulmonale: Secondary | ICD-10-CM | POA: Diagnosis not present

## 2022-03-13 DIAGNOSIS — N189 Chronic kidney disease, unspecified: Secondary | ICD-10-CM | POA: Diagnosis not present

## 2022-03-13 DIAGNOSIS — I130 Hypertensive heart and chronic kidney disease with heart failure and stage 1 through stage 4 chronic kidney disease, or unspecified chronic kidney disease: Secondary | ICD-10-CM | POA: Diagnosis not present

## 2022-03-13 DIAGNOSIS — I4510 Unspecified right bundle-branch block: Secondary | ICD-10-CM | POA: Diagnosis not present

## 2022-03-13 DIAGNOSIS — J929 Pleural plaque without asbestos: Secondary | ICD-10-CM | POA: Diagnosis not present

## 2022-03-13 DIAGNOSIS — H9190 Unspecified hearing loss, unspecified ear: Secondary | ICD-10-CM | POA: Diagnosis present

## 2022-03-13 DIAGNOSIS — Z66 Do not resuscitate: Secondary | ICD-10-CM | POA: Diagnosis not present

## 2022-03-13 DIAGNOSIS — E871 Hypo-osmolality and hyponatremia: Secondary | ICD-10-CM | POA: Diagnosis not present

## 2022-03-13 DIAGNOSIS — I214 Non-ST elevation (NSTEMI) myocardial infarction: Secondary | ICD-10-CM | POA: Diagnosis not present

## 2022-03-13 DIAGNOSIS — A419 Sepsis, unspecified organism: Secondary | ICD-10-CM | POA: Diagnosis not present

## 2022-03-13 DIAGNOSIS — R000 Tachycardia, unspecified: Secondary | ICD-10-CM | POA: Diagnosis not present

## 2022-03-13 DIAGNOSIS — R188 Other ascites: Secondary | ICD-10-CM | POA: Diagnosis not present

## 2022-03-13 DIAGNOSIS — I249 Acute ischemic heart disease, unspecified: Secondary | ICD-10-CM | POA: Diagnosis not present

## 2022-03-13 DIAGNOSIS — J439 Emphysema, unspecified: Secondary | ICD-10-CM | POA: Diagnosis not present

## 2022-03-13 DIAGNOSIS — Z9049 Acquired absence of other specified parts of digestive tract: Secondary | ICD-10-CM | POA: Diagnosis not present

## 2022-03-13 DIAGNOSIS — J9611 Chronic respiratory failure with hypoxia: Secondary | ICD-10-CM | POA: Diagnosis present

## 2022-03-13 DIAGNOSIS — K439 Ventral hernia without obstruction or gangrene: Secondary | ICD-10-CM | POA: Diagnosis not present

## 2022-03-13 DIAGNOSIS — I10 Essential (primary) hypertension: Secondary | ICD-10-CM | POA: Diagnosis not present

## 2022-03-13 DIAGNOSIS — R6520 Severe sepsis without septic shock: Secondary | ICD-10-CM | POA: Diagnosis not present

## 2022-03-13 DIAGNOSIS — R0902 Hypoxemia: Secondary | ICD-10-CM | POA: Diagnosis not present

## 2022-03-13 DIAGNOSIS — K219 Gastro-esophageal reflux disease without esophagitis: Secondary | ICD-10-CM | POA: Diagnosis present

## 2022-03-13 DIAGNOSIS — M7989 Other specified soft tissue disorders: Secondary | ICD-10-CM | POA: Diagnosis not present

## 2022-03-13 DIAGNOSIS — E785 Hyperlipidemia, unspecified: Secondary | ICD-10-CM | POA: Diagnosis not present

## 2022-03-13 DIAGNOSIS — N179 Acute kidney failure, unspecified: Secondary | ICD-10-CM | POA: Diagnosis not present

## 2022-03-13 HISTORY — DX: Gout, unspecified: M10.9

## 2022-03-13 HISTORY — DX: Hiccough: R06.6

## 2022-03-13 HISTORY — DX: Pulmonary hypertension, unspecified: I27.20

## 2022-03-13 HISTORY — DX: Hypotension, unspecified: I95.9

## 2022-03-13 NOTE — Progress Notes (Addendum)
Joshua [name] 86 y.o. male Pulmonary Rehab Orientation Note This patient who was referred to Pulmonary Rehab by [name] with the diagnosis of Stage 2 COPD arrived today in Pulmonary Rehab. He arrived ambulatory with normal gait. He does carry portable oxygen. [employer] is the provider for their DME. Per patient, Hershal uses oxygen . Color good, skin warm and dry. Patient is oriented to time and place. Patient's medical history, psychosocial health, and medications reviewed. Psychosocial assessment reveals patient lives with spouse. Zorian is currently retired. Patient hobbies include  watching TV, reading,spending time with his family and going to church. . Patient reports his stress level is low. Areas of stress/anxiety include  none . Patient does not exhibit signs of depression.  PHQ2/9 score 0/2. Kaushal shows good  coping skills with positive outlook on life. Offered emotional support and reassurance. Will continue to monitor and evaluate progress toward psychosocial goal(s) of participating in PR without any psychosocial barriers. Physical assessment reveals heart rate is normal, breath sounds clear to auscultation, no wheezes, rales, or rhonchi. Grip strength equal, strong. Distal pulses 1+ edema in lower extremities left greater than the right. Juanda Crumble reports he  does take medications as prescribed. Patient states he  follows a regular  diet. The patient has been trying to gain weight by drinking Ensure and eating more.. Pt's weight will be monitored closely, we will weigh him twice a week due to his rapid weight loss recently. Demonstration and practice of PLB using pulse oximeter. Leevi able to return demonstration satisfactorily. Safety and hand hygiene in the exercise area reviewed with patient. Lajuane voices understanding of the information reviewed. Department expectations discussed with patient and achievable goals were set. The patient shows enthusiasm about attending the program and we  look forward to working with Juanda Crumble. Dina completed a 6 min walk test today and is scheduled to begin exercise on 03/19/2022 at 10:15 am.   [place]

## 2022-03-13 NOTE — Progress Notes (Signed)
Pulmonary Individual Treatment Plan  Patient Details  Name: YANUEL TAGG MRN: [id number] Date of Birth: 1936/08/14 Referring Provider:   April Manson Pulmonary Rehab Walk Test from 03/13/2022 in [place]  Referring Provider Elsworth Soho       Initial Encounter Date:  Flowsheet Row Pulmonary Rehab Walk Test from 03/13/2022 in [place]  Date 03/13/22       Visit Diagnosis: Stage 2 moderate COPD by GOLD classification ([place])  Patient's Home Medications on Admission:   Current Outpatient Medications:    albuterol (VENTOLIN HFA) 108 (90 Base) MCG/ACT inhaler, Inhale 1 puff into the lungs every 4 (four) hours as needed for shortness of breath., Disp: , Rfl:    allopurinol (ZYLOPRIM) 100 MG tablet, Take 100 mg by mouth every morning., Disp: , Rfl:    Azelastine HCl 0.15 % SOLN, Place 2 sprays into the nose daily as needed (allergies)., Disp: , Rfl:    Cinnamon 500 MG capsule, Take 500 mg by mouth every morning., Disp: , Rfl:    ELIQUIS 5 MG TABS tablet, TAKE 1 TABLET BY MOUTH TWICE A DAY, Disp: 60 tablet, Rfl: 11   Ensure (ENSURE), Take 237 mLs by mouth daily., Disp: , Rfl:    gabapentin (NEURONTIN) 100 MG capsule, Take 300 mg by mouth 3 (three) times daily., Disp: , Rfl:    magnesium oxide (MAG-OX) 400 MG tablet, Take 1 tablet (400 mg total) by mouth daily., Disp: 30 tablet, Rfl: 3   metoCLOPramide (REGLAN) 5 MG tablet, Take 5 mg by mouth 3 (three) times daily before meals., Disp: , Rfl:    midodrine (PROAMATINE) 10 MG tablet, Take 1.5 tablets (15 mg total) by mouth 3 (three) times daily with meals., Disp: 90 tablet, Rfl: 3   Multiple Vitamin (MULTIVITAMIN WITH MINERALS) TABS tablet, Take 1 tablet by mouth daily., Disp: 30 tablet, Rfl: 0   ondansetron (ZOFRAN) 4 MG tablet, Take 1 tablet (4 mg total) by mouth every 6 (six) hours as needed for nausea., Disp: 20 tablet, Rfl: 0   OXYGEN, Inhale 2 L into the lungs continuous., Disp:  , Rfl:    pantoprazole (PROTONIX) 40 MG tablet, Take 40 mg by mouth every morning., Disp: , Rfl:    potassium chloride (KLOR-CON) 10 MEQ tablet, TAKE 2 TABLETS BY MOUTH 2 TIMES DAILY. (Patient taking differently: Take 20 mEq by mouth 2 (two) times daily.), Disp: 30 tablet, Rfl: 6   Riociguat 2 MG TABS, Take 2 mg by mouth in the morning, at noon, and at bedtime., Disp: 90 tablet, Rfl: 11   STIOLTO RESPIMAT 2.5-2.5 MCG/ACT AERS, INHALE 2 PUFFS BY MOUTH INTO THE LUNGS DAILY, Disp: 4 g, Rfl: 5   torsemide (DEMADEX) 20 MG tablet, Take 1 tablet (20 mg total) by mouth daily., Disp: 30 tablet, Rfl: 3   finasteride (PROSCAR) 5 MG tablet, Take 5 mg by mouth at bedtime. (Patient not taking: Reported on 03/13/2022), Disp: , Rfl:   Past Medical History: Past Medical History:  Diagnosis Date   Arthritis    BPH (benign prostatic hyperplasia)    Chronic hiccups    COPD (chronic obstructive pulmonary disease) (HCC)    Folliculitis    posterior scalp per office visit note of Dr Maudie Mercury 07/20/2014     GERD (gastroesophageal reflux disease)    Gout    Hypertension    Hypotension    PE (pulmonary thromboembolism) ([place])    Phlebitis    right arm  at  least 20 years ago    Pneumonia    hx of pneumonia as a child    Pulmonary hypertension ([place])    Sleep apnea     Tobacco Use: Social History   Tobacco Use  Smoking Status Former   Packs/day: 1.00   Years: 50.00   Total pack years: 50.00   Types: Cigarettes   Start date: 96   Quit date: 08/19/2006   Years since quitting: 15.5  Smokeless Tobacco Never    Labs: Review Flowsheet  More data exists      Latest Ref Rng & Units 04/25/2021 04/26/2021 04/28/2021 04/29/2021 10/04/2021  Labs for ITP Cardiac and Pulmonary Rehab  Bicarbonate 20.0 - 28.0 mmol/L - - - - 33.4   TCO2 22 - 32 mmol/L - - - - 34   O2 Saturation % 67.2  65.6  87.7  71.4  83     Capillary Blood Glucose: Lab Results  Component Value Date   GLUCAP 122 (H) 04/24/2021   GLUCAP 92  04/19/2021   GLUCAP 123 (H) 11/24/2008     Pulmonary Assessment Scores:  Pulmonary Assessment Scores     Row Name 03/13/22 1200         ADL UCSD   ADL Phase Entry     SOB Score total 48       CAT Score   CAT Score 20       mMRC Score   mMRC Score 1             UCSD: Self-administered rating of dyspnea associated with activities of daily living (ADLs) 6-point scale (0 = "not at all" to 5 = "maximal or unable to do because of breathlessness")  Scoring Scores range from 0 to 120.  Minimally important difference is 5 units  CAT: CAT can identify the health impairment of COPD patients and is better correlated with disease progression.  CAT has a scoring range of zero to 40. The CAT score is classified into four groups of low (less than 10), medium (10 - 20), high (21-30) and very high (31-40) based on the impact level of disease on health status. A CAT score over 10 suggests significant symptoms.  A worsening CAT score could be explained by an exacerbation, poor medication adherence, poor inhaler technique, or progression of COPD or comorbid conditions.  CAT MCID is 2 points  mMRC: mMRC (Modified Medical Research Council) Dyspnea Scale is used to assess the degree of baseline functional disability in patients of respiratory disease due to dyspnea. No minimal important difference is established. A decrease in score of 1 point or greater is considered a positive change.   Pulmonary Function Assessment:   Exercise Target Goals: Exercise Program Goal: Individual exercise prescription set using results from initial 6 min walk test and THRR while considering  patient's activity barriers and safety.   Exercise Prescription Goal: Initial exercise prescription builds to 30-45 minutes a day of aerobic activity, 2-3 days per week.  Home exercise guidelines will be given to patient during program as part of exercise prescription that the participant will acknowledge.  Activity  Barriers & Risk Stratification:  Activity Barriers & Cardiac Risk Stratification - 03/13/22 1115       Activity Barriers & Cardiac Risk Stratification   Activity Barriers Left Knee Replacement   LTKR in 2016 does not bother him at all now   Cardiac Risk Stratification Moderate   Has diastolic heart failure and Pulmonary hypertension  6 Minute Walk:  6 Minute Walk     Row Name 03/13/22 1216         6 Minute Walk   Phase Initial     Distance 1110 feet     Walk Time 6 minutes     # of Rest Breaks 1  4:10-4:40     MPH 2.1     METS 2.3     RPE 12     Perceived Dyspnea  2     VO2 Peak 8.05     Symptoms No     Resting HR 98 bpm     Resting BP 114/70     Resting Oxygen Saturation  95 %     Exercise Oxygen Saturation  during 6 min walk 88 %     Max Ex. HR 112 bpm     Max Ex. BP 116/66     2 Minute Post BP 112/60       Interval HR   1 Minute HR 84     2 Minute HR 94     3 Minute HR 100     4 Minute HR 98     5 Minute HR 112     6 Minute HR 104     2 Minute Post HR 83     Interval Heart Rate? Yes       Interval Oxygen   Interval Oxygen? Yes     Baseline Oxygen Saturation % 95 %  2L     1 Minute Oxygen Saturation % 99 %     1 Minute Liters of Oxygen 2 L     2 Minute Oxygen Saturation % 95 %     2 Minute Liters of Oxygen 2 L     3 Minute Oxygen Saturation % 91 %     3 Minute Liters of Oxygen 2 L     4 Minute Oxygen Saturation % 88 %     4 Minute Liters of Oxygen 2 L     5 Minute Oxygen Saturation % 90 %     5 Minute Liters of Oxygen 2 L     6 Minute Oxygen Saturation % 90 %     6 Minute Liters of Oxygen 2 L     2 Minute Post Oxygen Saturation % 98 %     2 Minute Post Liters of Oxygen 2 L              Oxygen Initial Assessment:  Oxygen Initial Assessment - 03/13/22 1150       Home Oxygen   Home Oxygen Device Home Concentrator;Portable Concentrator    Sleep Oxygen Prescription Continuous    Liters per minute 2    Home Exercise Oxygen  Prescription Pulsed    Liters per minute 2    Home Resting Oxygen Prescription None    Compliance with Home Oxygen Use Yes      Initial 6 min Walk   Oxygen Used Continuous    Liters per minute 2      Program Oxygen Prescription   Program Oxygen Prescription Continuous    Liters per minute 2      Intervention   Short Term Goals To learn and exhibit compliance with exercise, home and travel O2 prescription;To learn and understand importance of monitoring SPO2 with pulse oximeter and demonstrate accurate use of the pulse oximeter.;To learn and understand importance of maintaining oxygen saturations>88%;To learn and demonstrate proper pursed lip breathing techniques or other breathing  techniques. ;To learn and demonstrate proper use of respiratory medications    Long  Term Goals Exhibits compliance with exercise, home  and travel O2 prescription;Verbalizes importance of monitoring SPO2 with pulse oximeter and return demonstration;Maintenance of O2 saturations>88%;Exhibits proper breathing techniques, such as pursed lip breathing or other method taught during program session;Compliance with respiratory medication;Demonstrates proper use of MDI's             Oxygen Re-Evaluation:   Oxygen Discharge (Final Oxygen Re-Evaluation):   Initial Exercise Prescription:  Initial Exercise Prescription - 03/13/22 1200       Date of Initial Exercise RX and Referring Provider   Date 03/13/22    Referring Provider Elsworth Soho    Expected Discharge Date 05/16/22      Oxygen   Oxygen Continuous    Liters 2    Maintain Oxygen Saturation 88% or higher      NuStep   Level 2    SPM 80    Minutes 15      Track   Minutes 15    METs 2.3      Prescription Details   Frequency (times per week) 2    Duration Progress to 30 minutes of continuous aerobic without signs/symptoms of physical distress      Intensity   THRR 40-80% of Max Heartrate 54-109    Ratings of Perceived Exertion 11-13    Perceived  Dyspnea 0-4      Progression   Progression Continue progressive overload as per policy without signs/symptoms or physical distress.      Resistance Training   Training Prescription Yes    Weight blue bands    Reps 10-15             Perform Capillary Blood Glucose checks as needed.  Exercise Prescription Changes:   Exercise Comments:   Exercise Goals and Review:   Exercise Goals     Row Name 03/13/22 1212             Exercise Goals   Increase Physical Activity Yes       Intervention Provide advice, education, support and counseling about physical activity/exercise needs.;Develop an individualized exercise prescription for aerobic and resistive training based on initial evaluation findings, risk stratification, comorbidities and participant's personal goals.       Expected Outcomes Short Term: Attend rehab on a regular basis to increase amount of physical activity.;Long Term: Add in home exercise to make exercise part of routine and to increase amount of physical activity.;Long Term: Exercising regularly at least 3-5 days a week.       Increase Strength and Stamina Yes       Intervention Develop an individualized exercise prescription for aerobic and resistive training based on initial evaluation findings, risk stratification, comorbidities and participant's personal goals.;Provide advice, education, support and counseling about physical activity/exercise needs.       Expected Outcomes Short Term: Increase workloads from initial exercise prescription for resistance, speed, and METs.;Short Term: Perform resistance training exercises routinely during rehab and add in resistance training at home;Long Term: Improve cardiorespiratory fitness, muscular endurance and strength as measured by increased METs and functional capacity (6MWT)       Able to understand and use rate of perceived exertion (RPE) scale Yes       Intervention Provide education and explanation on how to use RPE  scale       Expected Outcomes Short Term: Able to use RPE daily in rehab to express subjective intensity level;Long Term:  Able to use RPE to guide intensity level when exercising independently       Able to understand and use Dyspnea scale Yes       Intervention Provide education and explanation on how to use Dyspnea scale       Expected Outcomes Short Term: Able to use Dyspnea scale daily in rehab to express subjective sense of shortness of breath during exertion;Long Term: Able to use Dyspnea scale to guide intensity level when exercising independently       Knowledge and understanding of Target Heart Rate Range (THRR) Yes       Intervention Provide education and explanation of THRR including how the numbers were predicted and where they are located for reference       Expected Outcomes Short Term: Able to state/look up THRR;Long Term: Able to use THRR to govern intensity when exercising independently;Short Term: Able to use daily as guideline for intensity in rehab       Understanding of Exercise Prescription Yes       Intervention Provide education, explanation, and written materials on patient's individual exercise prescription       Expected Outcomes Short Term: Able to explain program exercise prescription;Long Term: Able to explain home exercise prescription to exercise independently                Exercise Goals Re-Evaluation :   Discharge Exercise Prescription (Final Exercise Prescription Changes):   Nutrition:  Target Goals: Understanding of nutrition guidelines, daily intake of sodium <1520m, cholesterol <2040m calories 30% from fat and 7% or less from saturated fats, daily to have 5 or more servings of fruits and vegetables.  Biometrics:  Pre Biometrics - 03/13/22 1030       Pre Biometrics   Grip Strength 23 kg              Nutrition Therapy Plan and Nutrition Goals:   Nutrition Assessments:  MEDIFICTS Score Key: ?70 Need to make dietary changes  40-70  Heart Healthy Diet ? 40 Therapeutic Level Cholesterol Diet   Picture Your Plate Scores: <4<97nhealthy dietary pattern with much room for improvement. 41-50 Dietary pattern unlikely to meet recommendations for good health and room for improvement. 51-60 More healthful dietary pattern, with some room for improvement.  >60 Healthy dietary pattern, although there may be some specific behaviors that could be improved.    Nutrition Goals Re-Evaluation:   Nutrition Goals Discharge (Final Nutrition Goals Re-Evaluation):   Psychosocial: Target Goals: Acknowledge presence or absence of significant depression and/or stress, maximize coping skills, provide positive support system. Participant is able to verbalize types and ability to use techniques and skills needed for reducing stress and depression.  Initial Review & Psychosocial Screening:  Initial Psych Review & Screening - 03/13/22 1237       Initial Review   Current issues with None Identified      Family Dynamics   Good Support System? Yes   Wife, son, daughter, grandchildren annd 1 great grandchild.     Barriers   Psychosocial barriers to participate in program There are no identifiable barriers or psychosocial needs.      Screening Interventions   Interventions Encouraged to exercise    Expected Outcomes Short Term goal: Identification and review with participant of any Quality of Life or Depression concerns found by scoring the questionnaire.;Short Term goal: Utilizing psychosocial counselor, staff and physician to assist with identification of specific Stressors or current issues interfering with healing process. Setting desired goal for  each stressor or current issue identified.             Quality of Life Scores:  Scores of 19 and below usually indicate a poorer quality of life in these areas.  A difference of  2-3 points is a clinically meaningful difference.  A difference of 2-3 points in the total score of the Quality  of Life Index has been associated with significant improvement in overall quality of life, self-image, physical symptoms, and general health in studies assessing change in quality of life.  PHQ-9: Review Flowsheet       03/13/2022 05/09/2021 03/03/2018 11/03/2017  Depression screen PHQ 2/9  Decreased Interest 0 0 0 0  Down, Depressed, Hopeless 0 0 0 0  PHQ - 2 Score 0 0 0 0  Altered sleeping 1 - 0 -  Tired, decreased energy 1 - 1 -  Change in appetite 0 - 0 -  Feeling bad or failure about yourself  0 - - -  Trouble concentrating 0 - 0 -  Moving slowly or fidgety/restless 0 - 0 -  Suicidal thoughts 0 - 0 -  PHQ-9 Score 2 - 1 -  Difficult doing work/chores Not difficult at all - Not difficult at all -   Interpretation of Total Score  Total Score Depression Severity:  1-4 = Minimal depression, 5-9 = Mild depression, 10-14 = Moderate depression, 15-19 = Moderately severe depression, 20-27 = Severe depression   Psychosocial Evaluation and Intervention:  Psychosocial Evaluation - 03/13/22 1240       Psychosocial Evaluation & Interventions   Interventions Encouraged to exercise with the program and follow exercise prescription    Expected Outcomes For Taygen to attend the PR program without any psychosocial issues or concerns.    Continue Psychosocial Services  Follow up required by staff             Psychosocial Re-Evaluation:   Psychosocial Discharge (Final Psychosocial Re-Evaluation):   Education: Education Goals: Education classes will be provided on a weekly basis, covering required topics. Participant will state understanding/return demonstration of topics presented.  Learning Barriers/Preferences:   Education Topics: Risk Factor Reduction:  -Group instruction that is supported by a PowerPoint presentation. Instructor discusses the definition of a risk factor, different risk factors for pulmonary disease, and how the heart and lungs work together.     Nutrition  for Pulmonary Patient:  -Group instruction provided by PowerPoint slides, verbal discussion, and written materials to support subject matter. The instructor gives an explanation and review of healthy diet recommendations, which includes a discussion on weight management, recommendations for fruit and vegetable consumption, as well as protein, fluid, caffeine, fiber, sodium, sugar, and alcohol. Tips for eating when patients are short of breath are discussed. Flowsheet Row PULMONARY REHAB OTHER RESPIRATORY from 02/26/2018 in [place]  Date 01/01/18  Educator edna  Instruction Review Code 2- Demonstrated Understanding       Pursed Lip Breathing:  -Group instruction that is supported by demonstration and informational handouts. Instructor discusses the benefits of pursed lip and diaphragmatic breathing and detailed demonstration on how to preform both.     Oxygen Safety:  -Group instruction provided by PowerPoint, verbal discussion, and written material to support subject matter. There is an overview of "What is Oxygen" and "Why do we need it".  Instructor also reviews how to create a safe environment for oxygen use, the importance of using oxygen as prescribed, and the risks of noncompliance. There is a  brief discussion on traveling with oxygen and resources the patient may utilize. Flowsheet Row PULMONARY REHAB OTHER RESPIRATORY from 02/26/2018 in [place]  Date 12/04/17  Educator Lucianne Lei Murphy  Instruction Review Code 2- Demonstrated Understanding       Oxygen Equipment:  -Group instruction provided by [facility] Staff utilizing handouts, written materials, and equipment demonstrations. Flowsheet Row PULMONARY REHAB OTHER RESPIRATORY from 02/26/2018 in [place]  Date 12/18/17  Educator [place]  Instruction Review Code 2- Demonstrated Understanding       Signs and Symptoms:   -Group instruction provided by written material and verbal discussion to support subject matter. Warning signs and symptoms of infection, stroke, and heart attack are reviewed and when to call the physician/911 reinforced. Tips for preventing the spread of infection discussed. Flowsheet Row PULMONARY REHAB OTHER RESPIRATORY from 02/26/2018 in [place]  Date 02/26/18  Educator Remo Lipps  Instruction Review Code 1- Verbalizes Understanding       Advanced Directives:  -Group instruction provided by verbal instruction and written material to support subject matter. Instructor reviews Advanced Directive laws and proper instruction for filling out document.   Pulmonary Video:  -Group video education that reviews the importance of medication and oxygen compliance, exercise, good nutrition, pulmonary hygiene, and pursed lip and diaphragmatic breathing for the pulmonary patient.   Exercise for the Pulmonary Patient:  -Group instruction that is supported by a PowerPoint presentation. Instructor discusses benefits of exercise, core components of exercise, frequency, duration, and intensity of an exercise routine, importance of utilizing pulse oximetry during exercise, safety while exercising, and options of places to exercise outside of rehab.   Flowsheet Row PULMONARY REHAB OTHER RESPIRATORY from 02/26/2018 in [place]  Date 12/25/17  Educator EP  Instruction Review Code 1- Verbalizes Understanding       Pulmonary Medications:  -Verbally interactive group education provided by instructor with focus on inhaled medications and proper administration.   Anatomy and Physiology of the Respiratory System and Intimacy:  -Group instruction provided by PowerPoint, verbal discussion, and written material to support subject matter. Instructor reviews respiratory cycle and anatomical components of the respiratory system and their functions.  Instructor also reviews differences in obstructive and restrictive respiratory diseases with examples of each. Intimacy, Sex, and Sexuality differences are reviewed with a discussion on how relationships can change when diagnosed with pulmonary disease. Common sexual concerns are reviewed. Flowsheet Row PULMONARY REHAB OTHER RESPIRATORY from 02/26/2018 in [place]  Date 01/15/18  Educator rn  Instruction Review Code 2- Demonstrated Understanding       MD DAY -A group question and answer session with a medical doctor that allows participants to ask questions that relate to their pulmonary disease state. Flowsheet Row PULMONARY REHAB OTHER RESPIRATORY from 02/26/2018 in [place]  Date 01/22/18  Educator Dr. Nelda Marseille  Instruction Review Code 1- Verbalizes Understanding       OTHER EDUCATION -Group or individual verbal, written, or video instructions that support the educational goals of the pulmonary rehab program. [place] OTHER RESPIRATORY from 02/26/2018 in [place]  Date 02/12/18  Educator [place]  Instruction Review Code 1- Verbalizes Understanding       Holiday Eating Survival Tips:  -Group instruction provided by PowerPoint slides, verbal discussion, and written materials to support subject  matter. The instructor gives patients tips, tricks, and techniques to help them not only survive but enjoy the holidays despite the onslaught of food that accompanies the holidays.   Knowledge Questionnaire Score:  Knowledge Questionnaire Score - 03/13/22 1201       Knowledge Questionnaire Score   Pre Score 18/18             Core Components/Risk Factors/Patient Goals at Admission:  Personal Goals and Risk Factors at Admission - 03/13/22 1244       Core Components/Risk Factors/Patient Goals on Admission    Weight  Management Weight Loss   Since 04/2019 when pt was hospitalized he has lost 62 pounds. He is concerned about this but they have not determined any medical reasons for this.He states the last month he has lost 6 pounds.   Improve shortness of breath with ADL's Yes    Intervention Provide education, individualized exercise plan and daily activity instruction to help decrease symptoms of SOB with activities of daily living.    Expected Outcomes Short Term: Improve cardiorespiratory fitness to achieve a reduction of symptoms when performing ADLs;Long Term: Be able to perform more ADLs without symptoms or delay the onset of symptoms    Increase knowledge of respiratory medications and ability to use respiratory devices properly  Yes    Intervention Provide education and demonstration as needed of appropriate use of medications, inhalers, and oxygen therapy.    Expected Outcomes Short Term: Achieves understanding of medications use. Understands that oxygen is a medication prescribed by physician. Demonstrates appropriate use of inhaler and oxygen therapy.;Long Term: Maintain appropriate use of medications, inhalers, and oxygen therapy.             Core Components/Risk Factors/Patient Goals Review:    Core Components/Risk Factors/Patient Goals at Discharge (Final Review):    ITP Comments:   Comments: Dr. Rodman Pickle is Medical Director for Pulmonary Rehab at [facility].

## 2022-03-13 NOTE — Progress Notes (Signed)
Joshua [name] 86 y.o. male  Initial Psychosocial Assessment  Pt psychosocial assessment reveals pt lives with their spouse. Pt is currently retired. Pt hobbies include watching TV, reading and spending time with his family. Pt reports his stress level is low. Areas of stress/anxiety include Health due to his weight loss.  Pt does not exhibits signs of depression. Signs of depression include none. Pt shows good  coping skills with positive outlook .Offered emotional support and reassurance. Monitor and evaluate progress toward psychosocial goal(s).Joshua [name] is excited to attend the PR program and states he is looking forward to it.  Goal(s):  Help patient work toward returning to meaningful activities that improve patient's QOL and are attainable with patient's lung disease Participating in the PR program without any barriers or issues. Provide dietary assistance and support related to his weight loss. Improve his energy level and deconditioning.  03/13/2022 12:53 PM

## 2022-03-14 ENCOUNTER — Other Ambulatory Visit (HOSPITAL_COMMUNITY): Payer: Self-pay | Admitting: Cardiology

## 2022-03-15 ENCOUNTER — Encounter (HOSPITAL_COMMUNITY): Payer: Self-pay | Admitting: Cardiology

## 2022-03-15 ENCOUNTER — Ambulatory Visit (HOSPITAL_COMMUNITY)
Admission: RE | Admit: 2022-03-15 | Discharge: 2022-03-15 | Disposition: A | Discharge status: Home / Self Care | Payer: Medicare Other | Source: Ambulatory Visit | Attending: Cardiology | Admitting: Cardiology

## 2022-03-15 VITALS — BP 108/68 | HR 75 | Ht 72.0 in | Wt 179.2 lb

## 2022-03-15 DIAGNOSIS — J690 Pneumonitis due to inhalation of food and vomit: Secondary | ICD-10-CM | POA: Diagnosis not present

## 2022-03-15 DIAGNOSIS — K449 Diaphragmatic hernia without obstruction or gangrene: Secondary | ICD-10-CM | POA: Insufficient documentation

## 2022-03-15 DIAGNOSIS — I2699 Other pulmonary embolism without acute cor pulmonale: Secondary | ICD-10-CM | POA: Diagnosis not present

## 2022-03-15 DIAGNOSIS — I130 Hypertensive heart and chronic kidney disease with heart failure and stage 1 through stage 4 chronic kidney disease, or unspecified chronic kidney disease: Secondary | ICD-10-CM | POA: Insufficient documentation

## 2022-03-15 DIAGNOSIS — E43 Unspecified severe protein-calorie malnutrition: Secondary | ICD-10-CM | POA: Diagnosis not present

## 2022-03-15 DIAGNOSIS — Z7901 Long term (current) use of anticoagulants: Secondary | ICD-10-CM | POA: Insufficient documentation

## 2022-03-15 DIAGNOSIS — J439 Emphysema, unspecified: Secondary | ICD-10-CM | POA: Insufficient documentation

## 2022-03-15 DIAGNOSIS — N183 Chronic kidney disease, stage 3 unspecified: Secondary | ICD-10-CM | POA: Insufficient documentation

## 2022-03-15 DIAGNOSIS — G4733 Obstructive sleep apnea (adult) (pediatric): Secondary | ICD-10-CM | POA: Insufficient documentation

## 2022-03-15 DIAGNOSIS — R0789 Other chest pain: Secondary | ICD-10-CM | POA: Insufficient documentation

## 2022-03-15 DIAGNOSIS — Z79899 Other long term (current) drug therapy: Secondary | ICD-10-CM | POA: Insufficient documentation

## 2022-03-15 DIAGNOSIS — Z20822 Contact with and (suspected) exposure to covid-19: Secondary | ICD-10-CM | POA: Diagnosis not present

## 2022-03-15 DIAGNOSIS — K2100 Gastro-esophageal reflux disease with esophagitis, without bleeding: Secondary | ICD-10-CM | POA: Insufficient documentation

## 2022-03-15 DIAGNOSIS — I5032 Chronic diastolic (congestive) heart failure: Secondary | ICD-10-CM | POA: Insufficient documentation

## 2022-03-15 DIAGNOSIS — R6521 Severe sepsis with septic shock: Secondary | ICD-10-CM | POA: Diagnosis not present

## 2022-03-15 DIAGNOSIS — I214 Non-ST elevation (NSTEMI) myocardial infarction: Secondary | ICD-10-CM | POA: Diagnosis not present

## 2022-03-15 DIAGNOSIS — R0602 Shortness of breath: Secondary | ICD-10-CM | POA: Insufficient documentation

## 2022-03-15 DIAGNOSIS — I2782 Chronic pulmonary embolism: Secondary | ICD-10-CM | POA: Insufficient documentation

## 2022-03-15 DIAGNOSIS — A419 Sepsis, unspecified organism: Secondary | ICD-10-CM | POA: Diagnosis not present

## 2022-03-15 DIAGNOSIS — I2724 Chronic thromboembolic pulmonary hypertension: Secondary | ICD-10-CM | POA: Insufficient documentation

## 2022-03-15 DIAGNOSIS — Z9981 Dependence on supplemental oxygen: Secondary | ICD-10-CM | POA: Insufficient documentation

## 2022-03-15 DIAGNOSIS — K315 Obstruction of duodenum: Secondary | ICD-10-CM | POA: Diagnosis not present

## 2022-03-15 DIAGNOSIS — K7200 Acute and subacute hepatic failure without coma: Secondary | ICD-10-CM | POA: Diagnosis not present

## 2022-03-15 DIAGNOSIS — Z66 Do not resuscitate: Secondary | ICD-10-CM | POA: Diagnosis not present

## 2022-03-15 LAB — BASIC METABOLIC PANEL
Anion gap: 7 (ref 5–15)
BUN: 26 mg/dL — ABNORMAL HIGH (ref 8–23)
CO2: 32 mmol/L (ref 22–32)
Calcium: 10.6 mg/dL — ABNORMAL HIGH (ref 8.9–10.3)
Chloride: 97 mmol/L — ABNORMAL LOW (ref 98–111)
Creatinine, Ser: 1.78 mg/dL — ABNORMAL HIGH (ref 0.61–1.24)
GFR, Estimated: 37 mL/min — ABNORMAL LOW (ref >60)
Glucose, Bld: 117 mg/dL — ABNORMAL HIGH (ref 70–99)
Potassium: 4.6 mmol/L (ref 3.5–5.1)
Sodium: 136 mmol/L (ref 135–145)

## 2022-03-15 LAB — BRAIN NATRIURETIC PEPTIDE: B Natriuretic Peptide: 170.1 pg/mL — ABNORMAL HIGH (ref 0.0–100.0)

## 2022-03-15 MED ORDER — TORSEMIDE 20 MG PO TABS: 30.0000 mg | ORAL_TABLET | Freq: Every day | ORAL | 2 refills | Status: DC

## 2022-03-15 MED ORDER — DAPAGLIFLOZIN PROPANEDIOL 10 MG PO TABS: 10.0000 mg | ORAL_TABLET | Freq: Every day | ORAL | 3 refills | Status: DC

## 2022-03-15 NOTE — Patient Instructions (Signed)
Increase Torsemide to 30 mg (1 1/2 Tab) daily.  Restart Farxiga 10 mg daily.  Labs done today, your results will be available in MyChart, we will contact you for abnormal readings.  Repeat blood work in 10 days  Your physician has requested that you have an echocardiogram. Echocardiography is a painless test that uses sound waves to create images of your heart. It provides your doctor with information about the size and shape of your heart and how well your heart's chambers and valves are working. This procedure takes approximately one hour. There are no restrictions for this procedure.  Your physician recommends that you schedule a follow-up appointment in: 6 weeks  If you have any questions or concerns before your next appointment please send [place] a message through [place] or call our office at [phone].    TO LEAVE A MESSAGE FOR THE NURSE SELECT OPTION 2, PLEASE LEAVE A MESSAGE INCLUDING: YOUR NAME DATE OF BIRTH CALL BACK NUMBER REASON FOR CALL**this is important as we prioritize the call backs  YOU WILL RECEIVE A CALL BACK THE SAME DAY AS LONG AS YOU CALL BEFORE 4:00 PM  At the [facility], you and your health needs are our priority. As part of our continuing mission to provide you with exceptional heart care, we have created designated Provider Care Teams. These Care Teams include your primary Cardiologist (physician) and Advanced Practice Providers (APPs- Physician Assistants and Nurse Practitioners) who all work together to provide you with the care you need, when you need it.   You may see any of the following providers on your designated Care Team at your next follow up: Dr Glori Bickers Dr Haynes Kerns, NP Lyda Jester, [place] [facility] [place], [place] Audry Riles, PharmD   Please be sure to bring in all your medications bottles to every appointment.

## 2022-03-16 ENCOUNTER — Emergency Department (HOSPITAL_COMMUNITY): Payer: Medicare Other

## 2022-03-16 ENCOUNTER — Encounter (HOSPITAL_COMMUNITY): Payer: Self-pay

## 2022-03-16 ENCOUNTER — Inpatient Hospital Stay (HOSPITAL_COMMUNITY)
Admission: EM | Admit: 2022-03-16 | Discharge: 2022-03-21 | DRG: 871 | Disposition: A | Discharge status: Home Health | Payer: Medicare Other | Attending: Internal Medicine | Admitting: Internal Medicine

## 2022-03-16 ENCOUNTER — Emergency Department (HOSPITAL_BASED_OUTPATIENT_CLINIC_OR_DEPARTMENT_OTHER): Payer: Medicare Other

## 2022-03-16 DIAGNOSIS — R6521 Severe sepsis with septic shock: Secondary | ICD-10-CM | POA: Diagnosis present

## 2022-03-16 DIAGNOSIS — Z66 Do not resuscitate: Secondary | ICD-10-CM | POA: Diagnosis present

## 2022-03-16 DIAGNOSIS — Z882 Allergy status to sulfonamides status: Secondary | ICD-10-CM

## 2022-03-16 DIAGNOSIS — I2782 Chronic pulmonary embolism: Secondary | ICD-10-CM | POA: Diagnosis present

## 2022-03-16 DIAGNOSIS — Z87891 Personal history of nicotine dependence: Secondary | ICD-10-CM

## 2022-03-16 DIAGNOSIS — E43 Unspecified severe protein-calorie malnutrition: Secondary | ICD-10-CM | POA: Diagnosis present

## 2022-03-16 DIAGNOSIS — M7989 Other specified soft tissue disorders: Secondary | ICD-10-CM | POA: Diagnosis not present

## 2022-03-16 DIAGNOSIS — I5082 Biventricular heart failure: Secondary | ICD-10-CM | POA: Diagnosis present

## 2022-03-16 DIAGNOSIS — I10 Essential (primary) hypertension: Secondary | ICD-10-CM | POA: Diagnosis present

## 2022-03-16 DIAGNOSIS — M79604 Pain in right leg: Secondary | ICD-10-CM | POA: Diagnosis not present

## 2022-03-16 DIAGNOSIS — J439 Emphysema, unspecified: Secondary | ICD-10-CM | POA: Diagnosis present

## 2022-03-16 DIAGNOSIS — R0602 Shortness of breath: Secondary | ICD-10-CM | POA: Diagnosis not present

## 2022-03-16 DIAGNOSIS — E876 Hypokalemia: Secondary | ICD-10-CM | POA: Diagnosis present

## 2022-03-16 DIAGNOSIS — E871 Hypo-osmolality and hyponatremia: Secondary | ICD-10-CM | POA: Diagnosis not present

## 2022-03-16 DIAGNOSIS — N179 Acute kidney failure, unspecified: Secondary | ICD-10-CM | POA: Diagnosis present

## 2022-03-16 DIAGNOSIS — K219 Gastro-esophageal reflux disease without esophagitis: Secondary | ICD-10-CM | POA: Diagnosis present

## 2022-03-16 DIAGNOSIS — R945 Abnormal results of liver function studies: Secondary | ICD-10-CM | POA: Diagnosis not present

## 2022-03-16 DIAGNOSIS — I2699 Other pulmonary embolism without acute cor pulmonale: Secondary | ICD-10-CM | POA: Diagnosis present

## 2022-03-16 DIAGNOSIS — R0902 Hypoxemia: Secondary | ICD-10-CM | POA: Diagnosis not present

## 2022-03-16 DIAGNOSIS — G4733 Obstructive sleep apnea (adult) (pediatric): Secondary | ICD-10-CM | POA: Diagnosis present

## 2022-03-16 DIAGNOSIS — K315 Obstruction of duodenum: Secondary | ICD-10-CM | POA: Diagnosis present

## 2022-03-16 DIAGNOSIS — A419 Sepsis, unspecified organism: Principal | ICD-10-CM

## 2022-03-16 DIAGNOSIS — R6520 Severe sepsis without septic shock: Secondary | ICD-10-CM

## 2022-03-16 DIAGNOSIS — I2724 Chronic thromboembolic pulmonary hypertension: Secondary | ICD-10-CM | POA: Diagnosis present

## 2022-03-16 DIAGNOSIS — I5032 Chronic diastolic (congestive) heart failure: Secondary | ICD-10-CM | POA: Diagnosis present

## 2022-03-16 DIAGNOSIS — R000 Tachycardia, unspecified: Secondary | ICD-10-CM | POA: Diagnosis not present

## 2022-03-16 DIAGNOSIS — J9611 Chronic respiratory failure with hypoxia: Secondary | ICD-10-CM | POA: Diagnosis present

## 2022-03-16 DIAGNOSIS — J690 Pneumonitis due to inhalation of food and vomit: Secondary | ICD-10-CM | POA: Diagnosis present

## 2022-03-16 DIAGNOSIS — I272 Pulmonary hypertension, unspecified: Secondary | ICD-10-CM | POA: Diagnosis not present

## 2022-03-16 DIAGNOSIS — E785 Hyperlipidemia, unspecified: Secondary | ICD-10-CM | POA: Diagnosis present

## 2022-03-16 DIAGNOSIS — N189 Chronic kidney disease, unspecified: Secondary | ICD-10-CM | POA: Diagnosis present

## 2022-03-16 DIAGNOSIS — Z96652 Presence of left artificial knee joint: Secondary | ICD-10-CM | POA: Diagnosis present

## 2022-03-16 DIAGNOSIS — Z20822 Contact with and (suspected) exposure to covid-19: Secondary | ICD-10-CM | POA: Diagnosis present

## 2022-03-16 DIAGNOSIS — I700 Atherosclerosis of aorta: Secondary | ICD-10-CM | POA: Diagnosis present

## 2022-03-16 DIAGNOSIS — R188 Other ascites: Secondary | ICD-10-CM | POA: Diagnosis not present

## 2022-03-16 DIAGNOSIS — T502X5A Adverse effect of carbonic-anhydrase inhibitors, benzothiadiazides and other diuretics, initial encounter: Secondary | ICD-10-CM | POA: Diagnosis present

## 2022-03-16 DIAGNOSIS — I959 Hypotension, unspecified: Secondary | ICD-10-CM | POA: Diagnosis not present

## 2022-03-16 DIAGNOSIS — M109 Gout, unspecified: Secondary | ICD-10-CM | POA: Diagnosis present

## 2022-03-16 DIAGNOSIS — Z9981 Dependence on supplemental oxygen: Secondary | ICD-10-CM

## 2022-03-16 DIAGNOSIS — I130 Hypertensive heart and chronic kidney disease with heart failure and stage 1 through stage 4 chronic kidney disease, or unspecified chronic kidney disease: Secondary | ICD-10-CM | POA: Diagnosis present

## 2022-03-16 DIAGNOSIS — Z6824 Body mass index (BMI) 24.0-24.9, adult: Secondary | ICD-10-CM

## 2022-03-16 DIAGNOSIS — Z91018 Allergy to other foods: Secondary | ICD-10-CM

## 2022-03-16 DIAGNOSIS — I2510 Atherosclerotic heart disease of native coronary artery without angina pectoris: Secondary | ICD-10-CM | POA: Diagnosis present

## 2022-03-16 DIAGNOSIS — N401 Enlarged prostate with lower urinary tract symptoms: Secondary | ICD-10-CM | POA: Diagnosis present

## 2022-03-16 DIAGNOSIS — H9190 Unspecified hearing loss, unspecified ear: Secondary | ICD-10-CM | POA: Diagnosis present

## 2022-03-16 DIAGNOSIS — R55 Syncope and collapse: Secondary | ICD-10-CM | POA: Diagnosis present

## 2022-03-16 DIAGNOSIS — I82561 Chronic embolism and thrombosis of right calf muscular vein: Secondary | ICD-10-CM | POA: Diagnosis present

## 2022-03-16 DIAGNOSIS — R338 Other retention of urine: Secondary | ICD-10-CM | POA: Diagnosis present

## 2022-03-16 DIAGNOSIS — K449 Diaphragmatic hernia without obstruction or gangrene: Secondary | ICD-10-CM | POA: Diagnosis not present

## 2022-03-16 DIAGNOSIS — N1831 Chronic kidney disease, stage 3a: Secondary | ICD-10-CM | POA: Diagnosis present

## 2022-03-16 DIAGNOSIS — J9811 Atelectasis: Secondary | ICD-10-CM | POA: Diagnosis not present

## 2022-03-16 DIAGNOSIS — R42 Dizziness and giddiness: Secondary | ICD-10-CM | POA: Diagnosis not present

## 2022-03-16 DIAGNOSIS — N400 Enlarged prostate without lower urinary tract symptoms: Secondary | ICD-10-CM | POA: Diagnosis not present

## 2022-03-16 DIAGNOSIS — K5909 Other constipation: Secondary | ICD-10-CM | POA: Diagnosis present

## 2022-03-16 DIAGNOSIS — I2609 Other pulmonary embolism with acute cor pulmonale: Secondary | ICD-10-CM | POA: Diagnosis not present

## 2022-03-16 DIAGNOSIS — I249 Acute ischemic heart disease, unspecified: Secondary | ICD-10-CM | POA: Diagnosis not present

## 2022-03-16 DIAGNOSIS — J929 Pleural plaque without asbestos: Secondary | ICD-10-CM | POA: Diagnosis not present

## 2022-03-16 DIAGNOSIS — I4510 Unspecified right bundle-branch block: Secondary | ICD-10-CM | POA: Diagnosis not present

## 2022-03-16 DIAGNOSIS — K7200 Acute and subacute hepatic failure without coma: Secondary | ICD-10-CM | POA: Diagnosis present

## 2022-03-16 DIAGNOSIS — I214 Non-ST elevation (NSTEMI) myocardial infarction: Secondary | ICD-10-CM | POA: Diagnosis present

## 2022-03-16 DIAGNOSIS — J432 Centrilobular emphysema: Secondary | ICD-10-CM | POA: Diagnosis not present

## 2022-03-16 DIAGNOSIS — K439 Ventral hernia without obstruction or gangrene: Secondary | ICD-10-CM | POA: Diagnosis not present

## 2022-03-16 DIAGNOSIS — Z79899 Other long term (current) drug therapy: Secondary | ICD-10-CM

## 2022-03-16 DIAGNOSIS — Z8249 Family history of ischemic heart disease and other diseases of the circulatory system: Secondary | ICD-10-CM

## 2022-03-16 DIAGNOSIS — I5031 Acute diastolic (congestive) heart failure: Secondary | ICD-10-CM | POA: Diagnosis not present

## 2022-03-16 DIAGNOSIS — R627 Adult failure to thrive: Secondary | ICD-10-CM | POA: Diagnosis present

## 2022-03-16 DIAGNOSIS — I714 Abdominal aortic aneurysm, without rupture, unspecified: Secondary | ICD-10-CM | POA: Diagnosis not present

## 2022-03-16 DIAGNOSIS — Z9049 Acquired absence of other specified parts of digestive tract: Secondary | ICD-10-CM | POA: Diagnosis not present

## 2022-03-16 DIAGNOSIS — Z7901 Long term (current) use of anticoagulants: Secondary | ICD-10-CM

## 2022-03-16 HISTORY — DX: Sepsis, unspecified organism: A41.9

## 2022-03-16 HISTORY — DX: Sepsis, unspecified organism: R65.20

## 2022-03-16 LAB — LACTIC ACID, PLASMA
Lactic Acid, Venous: 6.8 mmol/L (ref 0.5–1.9)
Lactic Acid, Venous: 5.5 mmol/L (ref 0.5–1.9)
Lactic Acid, Venous: 1.7 mmol/L (ref 0.5–1.9)
Lactic Acid, Venous: 1.5 mmol/L (ref 0.5–1.9)

## 2022-03-16 LAB — URINALYSIS, ROUTINE W REFLEX MICROSCOPIC
Bilirubin Urine: NEGATIVE
Glucose, UA: NEGATIVE mg/dL
Ketones, ur: NEGATIVE mg/dL
Leukocytes,Ua: NEGATIVE
Nitrite: NEGATIVE
Protein, ur: 30 mg/dL — AB
Specific Gravity, Urine: 1.013 (ref 1.005–1.030)
pH: 5 (ref 5.0–8.0)

## 2022-03-16 LAB — COMPREHENSIVE METABOLIC PANEL
ALT: 272 U/L — ABNORMAL HIGH (ref 0–44)
AST: 359 U/L — ABNORMAL HIGH (ref 15–41)
Albumin: 3.4 g/dL — ABNORMAL LOW (ref 3.5–5.0)
Alkaline Phosphatase: 99 U/L (ref 38–126)
Anion gap: 16 — ABNORMAL HIGH (ref 5–15)
BUN: 37 mg/dL — ABNORMAL HIGH (ref 8–23)
CO2: 22 mmol/L (ref 22–32)
Calcium: 9.6 mg/dL (ref 8.9–10.3)
Chloride: 97 mmol/L — ABNORMAL LOW (ref 98–111)
Creatinine, Ser: 3.06 mg/dL — ABNORMAL HIGH (ref 0.61–1.24)
GFR, Estimated: 19 mL/min — ABNORMAL LOW (ref >60)
Glucose, Bld: 119 mg/dL — ABNORMAL HIGH (ref 70–99)
Potassium: 5.4 mmol/L — ABNORMAL HIGH (ref 3.5–5.1)
Sodium: 135 mmol/L (ref 135–145)
Total Bilirubin: 1.7 mg/dL — ABNORMAL HIGH (ref 0.3–1.2)
Total Protein: 6.9 g/dL (ref 6.5–8.1)

## 2022-03-16 LAB — CBC WITH DIFFERENTIAL/PLATELET
Abs Immature Granulocytes: 0.11 10*3/uL — ABNORMAL HIGH (ref 0.00–0.07)
Basophils Absolute: 0 10*3/uL (ref 0.0–0.1)
Basophils Relative: 0 %
Eosinophils Absolute: 0 10*3/uL (ref 0.0–0.5)
Eosinophils Relative: 0 %
HCT: 35.4 % — ABNORMAL LOW (ref 39.0–52.0)
Hemoglobin: 11.2 g/dL — ABNORMAL LOW (ref 13.0–17.0)
Immature Granulocytes: 1 %
Lymphocytes Relative: 2 %
Lymphs Abs: 0.4 10*3/uL — ABNORMAL LOW (ref 0.7–4.0)
MCH: 28.4 pg (ref 26.0–34.0)
MCHC: 31.6 g/dL (ref 30.0–36.0)
MCV: 89.8 fL (ref 80.0–100.0)
Monocytes Absolute: 0.8 10*3/uL (ref 0.1–1.0)
Monocytes Relative: 5 %
Neutro Abs: 14.3 10*3/uL — ABNORMAL HIGH (ref 1.7–7.7)
Neutrophils Relative %: 92 %
Platelets: 156 10*3/uL (ref 150–400)
RBC: 3.94 MIL/uL — ABNORMAL LOW (ref 4.22–5.81)
RDW: 14.5 % (ref 11.5–15.5)
WBC: 15.6 10*3/uL — ABNORMAL HIGH (ref 4.0–10.5)
nRBC: 0 % (ref 0.0–0.2)

## 2022-03-16 LAB — PROTIME-INR
INR: 2.2 — ABNORMAL HIGH (ref 0.8–1.2)
Prothrombin Time: 24.5 seconds — ABNORMAL HIGH (ref 11.4–15.2)

## 2022-03-16 LAB — HEMOGLOBIN A1C
Hgb A1c MFr Bld: 5 % (ref 4.8–5.6)
Mean Plasma Glucose: 96.8 mg/dL

## 2022-03-16 LAB — RESP PANEL BY RT-PCR (FLU A&B, COVID) ARPGX2
Influenza A by PCR: NEGATIVE
Influenza B by PCR: NEGATIVE
SARS Coronavirus 2 by RT PCR: NEGATIVE

## 2022-03-16 LAB — BRAIN NATRIURETIC PEPTIDE: B Natriuretic Peptide: 380.3 pg/mL — ABNORMAL HIGH (ref 0.0–100.0)

## 2022-03-16 LAB — TROPONIN I (HIGH SENSITIVITY)
Troponin I (High Sensitivity): 633 ng/L (ref <18)
Troponin I (High Sensitivity): 3171 ng/L (ref <18)
Troponin I (High Sensitivity): 6913 ng/L (ref <18)
Troponin I (High Sensitivity): 8808 ng/L (ref <18)

## 2022-03-16 LAB — HEPARIN LEVEL (UNFRACTIONATED)
Heparin Unfractionated: >1.1 IU/mL — ABNORMAL HIGH (ref 0.30–0.70)
Heparin Unfractionated: >1.1 IU/mL — ABNORMAL HIGH (ref 0.30–0.70)

## 2022-03-16 LAB — APTT
aPTT: 36 seconds (ref 24–36)
aPTT: 98 seconds — ABNORMAL HIGH (ref 24–36)

## 2022-03-16 LAB — MRSA NEXT GEN BY PCR, NASAL: MRSA by PCR Next Gen: NOT DETECTED

## 2022-03-16 LAB — GLUCOSE, CAPILLARY
Glucose-Capillary: 105 mg/dL — ABNORMAL HIGH (ref 70–99)
Glucose-Capillary: 108 mg/dL — ABNORMAL HIGH (ref 70–99)

## 2022-03-16 LAB — LIPASE, BLOOD: Lipase: 31 U/L (ref 11–51)

## 2022-03-16 MED ORDER — SODIUM CHLORIDE 0.9 % IV SOLN
100.0000 mg | Freq: Two times a day (BID) | INTRAVENOUS | Status: DC
  Administered 2022-03-16 – 2022-03-18 (×4): 100 mg via INTRAVENOUS
  Filled 2022-03-16 (×5): qty 100

## 2022-03-16 MED ORDER — SODIUM CHLORIDE 0.9 % IV SOLN: 2.0000 g | INTRAVENOUS | Status: DC

## 2022-03-16 MED ORDER — VANCOMYCIN VARIABLE DOSE PER UNSTABLE RENAL FUNCTION (PHARMACIST DOSING): Status: DC

## 2022-03-16 MED ORDER — HEPARIN (PORCINE) 25000 UT/250ML-% IV SOLN
1100.0000 [IU]/h | INTRAVENOUS | Status: DC
  Administered 2022-03-16: 1350 [IU]/h via INTRAVENOUS
  Administered 2022-03-17 – 2022-03-18 (×2): 1300 [IU]/h via INTRAVENOUS
  Administered 2022-03-19 – 2022-03-20 (×2): 1100 [IU]/h via INTRAVENOUS
  Filled 2022-03-16 (×5): qty 250

## 2022-03-16 MED ORDER — PIPERACILLIN-TAZOBACTAM 3.375 G IVPB
3.3750 g | Freq: Three times a day (TID) | INTRAVENOUS | Status: DC
  Administered 2022-03-16 – 2022-03-19 (×8): 3.375 g via INTRAVENOUS
  Filled 2022-03-16 (×9): qty 50

## 2022-03-16 MED ORDER — LACTATED RINGERS IV BOLUS (SEPSIS)
500.0000 mL | Freq: Once | INTRAVENOUS | Status: AC
  Administered 2022-03-16: 500 mL via INTRAVENOUS

## 2022-03-16 MED ORDER — TRIMETHOBENZAMIDE HCL 100 MG/ML IM SOLN
200.0000 mg | Freq: Once | INTRAMUSCULAR | Status: AC
  Administered 2022-03-16: 200 mg via INTRAMUSCULAR
  Filled 2022-03-16: qty 2

## 2022-03-16 MED ORDER — VANCOMYCIN HCL 1500 MG/300ML IV SOLN
1500.0000 mg | Freq: Once | INTRAVENOUS | Status: AC
  Administered 2022-03-16: 1500 mg via INTRAVENOUS
  Filled 2022-03-16: qty 300

## 2022-03-16 MED ORDER — LACTATED RINGERS IV BOLUS (SEPSIS)
1000.0000 mL | Freq: Once | INTRAVENOUS | Status: AC
  Administered 2022-03-16: 1000 mL via INTRAVENOUS

## 2022-03-16 MED ORDER — METRONIDAZOLE 500 MG/100ML IV SOLN
500.0000 mg | Freq: Once | INTRAVENOUS | Status: AC
  Administered 2022-03-16: 500 mg via INTRAVENOUS
  Filled 2022-03-16: qty 100

## 2022-03-16 MED ORDER — POLYETHYLENE GLYCOL 3350 17 G PO PACK
17.0000 g | PACK | Freq: Every day | ORAL | Status: DC | PRN
  Administered 2022-03-17: 17 g via ORAL
  Filled 2022-03-16: qty 1

## 2022-03-16 MED ORDER — VANCOMYCIN HCL IN DEXTROSE 1-5 GM/200ML-% IV SOLN: 1000.0000 mg | Freq: Once | INTRAVENOUS | Status: DC

## 2022-03-16 MED ORDER — LACTATED RINGERS IV SOLN: INTRAVENOUS | Status: DC

## 2022-03-16 MED ORDER — SODIUM CHLORIDE 0.9 % IV SOLN
6.2500 mg | Freq: Four times a day (QID) | INTRAVENOUS | Status: DC | PRN
  Filled 2022-03-16: qty 0.25

## 2022-03-16 MED ORDER — ASPIRIN 300 MG RE SUPP
300.0000 mg | Freq: Once | RECTAL | Status: AC
  Administered 2022-03-16: 300 mg via RECTAL
  Filled 2022-03-16: qty 1

## 2022-03-16 MED ORDER — PANTOPRAZOLE SODIUM 40 MG IV SOLR
40.0000 mg | Freq: Every day | INTRAVENOUS | Status: DC
  Administered 2022-03-16 – 2022-03-17 (×2): 40 mg via INTRAVENOUS
  Filled 2022-03-16 (×2): qty 10

## 2022-03-16 MED ORDER — ARFORMOTEROL TARTRATE 15 MCG/2ML IN NEBU
15.0000 ug | INHALATION_SOLUTION | Freq: Two times a day (BID) | RESPIRATORY_TRACT | Status: DC
  Administered 2022-03-16 – 2022-03-20 (×9): 15 ug via RESPIRATORY_TRACT
  Filled 2022-03-16 (×9): qty 2

## 2022-03-16 MED ORDER — DOCUSATE SODIUM 100 MG PO CAPS: 100.0000 mg | ORAL_CAPSULE | Freq: Two times a day (BID) | ORAL | Status: DC | PRN

## 2022-03-16 MED ORDER — NOREPINEPHRINE 4 MG/250ML-% IV SOLN
2.0000 ug/min | INTRAVENOUS | Status: DC
  Administered 2022-03-16: 2 ug/min via INTRAVENOUS
  Administered 2022-03-17: 4 ug/min via INTRAVENOUS
  Filled 2022-03-16 (×2): qty 250

## 2022-03-16 MED ORDER — SODIUM CHLORIDE 0.9 % IV SOLN
2.0000 g | Freq: Once | INTRAVENOUS | Status: AC
  Administered 2022-03-16: 2 g via INTRAVENOUS
  Filled 2022-03-16: qty 12.5

## 2022-03-16 MED ORDER — SODIUM CHLORIDE 0.9 % IV SOLN
250.0000 mL | INTRAVENOUS | Status: DC
  Administered 2022-03-16: 250 mL via INTRAVENOUS

## 2022-03-16 MED ORDER — CHLORHEXIDINE GLUCONATE CLOTH 2 % EX PADS
6.0000 | MEDICATED_PAD | Freq: Every day | CUTANEOUS | Status: DC
  Administered 2022-03-16 – 2022-03-21 (×5): 6 via TOPICAL

## 2022-03-16 MED ORDER — BISACODYL 10 MG RE SUPP
10.0000 mg | Freq: Every day | RECTAL | Status: DC
  Administered 2022-03-16 – 2022-03-19 (×3): 10 mg via RECTAL
  Filled 2022-03-16 (×4): qty 1

## 2022-03-16 MED ORDER — REVEFENACIN 175 MCG/3ML IN SOLN
175.0000 ug | Freq: Every day | RESPIRATORY_TRACT | Status: DC
  Administered 2022-03-17 – 2022-03-20 (×4): 175 ug via RESPIRATORY_TRACT
  Filled 2022-03-16 (×7): qty 3

## 2022-03-16 MED ORDER — INSULIN ASPART 100 UNIT/ML IJ SOLN: 0.0000 [IU] | INTRAMUSCULAR | Status: DC

## 2022-03-16 MED ORDER — MAGNESIUM SULFATE 2 GM/50ML IV SOLN
2.0000 g | Freq: Once | INTRAVENOUS | Status: AC
  Administered 2022-03-16: 2 g via INTRAVENOUS
  Filled 2022-03-16: qty 50

## 2022-03-16 NOTE — Progress Notes (Signed)
Pharmacy Antibiotic Note  Joshua [name] is a 86 y.o. male admitted on 03/16/2022 with sepsis.  Pharmacy has been consulted for Cefepime and Vancomycin dosing. Also, Metronidazole per MD.   Plan: Cefepime 2g IV x1 (ordered)  Vancomycin 1500 mg IV x1 F/up SCr for further dosing -- likely to change in setting of acute septic picture     Temp (24hrs), Avg:102 F (38.9 C), Min:102 F (38.9 C), Max:102 F (38.9 C)  Recent Labs  Lab 03/15/22 0952  CREATININE 1.78*    Estimated Creatinine Clearance: 33.9 mL/min (A) (by C-G formula based on SCr of 1.78 mg/dL (H)).    Allergies  Allergen Reactions   Other Swelling    Beer- Swelling    Sunflower Oil Swelling   Sulfa Antibiotics Rash    Antimicrobials this admission: 7/29 Vanc >> 7/29 Cefepime >>  Dose adjustments this admission:   Microbiology results: 7/29 BCx: pending 7/29 UCx: pending   Thank you for allowing pharmacy to be a part of this patient's care.  Sloan Leiter, PharmD, BCPS, BCCCP Clinical Pharmacist Please refer to [facility] for [place] numbers 03/16/2022 9:06 AM

## 2022-03-16 NOTE — H&P (Signed)
NAME:  Joshua [name], MRN:  [id number], DOB:  December 05, 1935, LOS: 0 ADMISSION DATE:  03/16/2022, CONSULTATION DATE:  03/16/22 REFERRING MD:  EDP, CHIEF COMPLAINT:  N/V   History of Present Illness:  86 year old man with myriad of chronic medical illnesses detailed below presenting with sudden onset N/V and hiccups yesterday.  No sick contacts.  Progressed to SOB, fevers.  Spouse at bedside corroborating history.  ROS as below.   Labs show lactic acidmia, acute renal/liver failure.  Patient started on broad spectrum abx, CT C/A/P possible colitis + constipation + aspiration vs. CAP.  PCCM asked to admit.  Pertinent  Medical History  Big picture: multifactorial RH failure, bad COPD, CKD with recent FTT, weight loss  Workup in Sept 2022 for similar presentation showed nutcracker SMA-Aorta duodenal narrowing  Pulmonary HTN, diastolic CHF, h/o PE (6415), hypotension requiring midodrine, HTN, aortic atherosclerosis, OSA, h/o hiatial hernia, chronic respiratory failure with hypoixa, supplemental O2, COPD, h/p CAP, GErD, arthritis, CKD, BPH, hearing loss, gout  Significant Hospital Events: Including procedures, antibiotic start and stop dates in addition to other pertinent events   7/29 admit  Interim History / Subjective:  Consult/admit  Objective   Blood pressure 106/64, pulse 94, temperature (!) 102 F (38.9 C), temperature source Oral, resp. rate 16, SpO2 100 %.        Intake/Output Summary (Last 24 hours) at 03/16/2022 1217 Last data filed at 03/16/2022 1158 Gross per 24 hour  Intake 3000 ml  Output --  Net 3000 ml   There were no vitals filed for this visit.  Examination: General: chronicailly ill map burping/hiccuping HENT: Trachea midline, +temporal wasting Lungs: Crackles R>L base, no accessory muscle use Cardiovascular: RRR, ext feel warm Abdomen: soft, hypoactive BS, nontender Extremities: 1+ edema Neuro: Moves all 4 ext to command Skin: no rashes  EKG stable  RBB Lactate 6 AKI, ALI WBC up   Resolved Hospital Problem list   N/A  Assessment & Plan:  Severe sepsis with borderline shock, lactic acidemia- preceding GI symptoms, findings on CT would point more toward a GI bug and subsequent aspiration.  Does have nutcracker duodenal obstruction but stomach looks fairly decompressed on CT.  COPD on HOT not in flare  FTT, weight loss, severe protein calorie malnutrition: has been seen by palliative as OP, last   Groups 2/4 pulm HTN on riociguat/AC PTA  RBBB, long QT  Acute on chronic (CKD3a) renal failure likely pre-renal + septic ATN, no obstruction on CT  Acute liver injury- will check RUQ.   If does not respond to typical fluid resuscitation and/or lactate does not clear, should consider concomitant cardiogenic shock vs. Ischemic bowel; neither of which would have great treatment options in this frail elderly man.  - Given multiorgan failure and borderline pressures, will watch in ICU for tonight - Vanc/zosyn/doxy, f/u culture data - NGT to LIS to reduce further aspiration - NPO, start suppositories  - Fluids as ordered - Eliquis on hold, use heparin instead for chronic PE - Trend trop/lactate - Peripheral levophed to MAP 65, note that he was on midodrine PTA - Avoid nephrotoxins - Riociguat on hold - Brovana/yupelri - If does not turn around with supportive care, re-engagement with palliative care is probably in order  Best Practice (right click and "Reselect all SmartList Selections" daily)   Diet/type: NPO DVT prophylaxis: systemic heparin GI prophylaxis: PPI Lines: N/A Foley:  N/A Code Status:  full code Last date of multidisciplinary goals of care discussion [Pending]  Labs   CBC: Recent Labs  Lab 03/16/22 0914  WBC 15.6*  NEUTROABS 14.3*  HGB 11.2*  HCT 35.4*  MCV 89.8  PLT 350    Basic Metabolic Panel: Recent Labs  Lab 03/15/22 0952 03/16/22 0914  NA 136 135  K 4.6 5.4*  CL 97* 97*  CO2 32 22   GLUCOSE 117* 119*  BUN 26* 37*  CREATININE 1.78* 3.06*  CALCIUM 10.6* 9.6   GFR: Estimated Creatinine Clearance: 19.7 mL/min (A) (by C-G formula based on SCr of 3.06 mg/dL (H)). Recent Labs  Lab 03/16/22 0914  WBC 15.6*  LATICACIDVEN 6.8*    Liver Function Tests: Recent Labs  Lab 03/16/22 0914  AST 359*  ALT 272*  ALKPHOS 99  BILITOT 1.7*  PROT 6.9  ALBUMIN 3.4*   No results for input(s): "LIPASE", "AMYLASE" in the last 168 hours. No results for input(s): "AMMONIA" in the last 168 hours.  ABG    Component Value Date/Time   PHART 7.391 04/24/2021 0220   PCO2ART 60.5 (H) 04/24/2021 0220   PO2ART 38.5 (LL) 04/24/2021 0220   HCO3 33.4 (H) 10/04/2021 1008   TCO2 34 (H) 10/04/2021 1008   O2SAT 83 10/04/2021 1008     Coagulation Profile: Recent Labs  Lab 03/16/22 0914  INR 2.2*    Cardiac Enzymes: No results for input(s): "CKTOTAL", "CKMB", "CKMBINDEX", "TROPONINI" in the last 168 hours.  HbA1C: No results found for: "HGBA1C"  CBG: No results for input(s): "GLUCAP" in the last 168 hours.  Review of Systems:    Positive Symptoms in bold:  Constitutional fevers, chills, weight loss, fatigue, anorexia, malaise  Eyes decreased vision, double vision, eye irritation  Ears, Nose, Mouth, Throat sore throat, trouble swallowing, sinus congestion  Cardiovascular chest pain, paroxysmal nocturnal dyspnea, lower ext edema, palpitations   Respiratory SOB, cough, DOE, hemoptysis, wheezing  Gastrointestinal nausea, vomiting, diarrhea  Genitourinary burning with urination, trouble urinating  Musculoskeletal joint aches, joint swelling, back pain  Integumentary  rashes, skin lesions  Neurological focal weakness, focal numbness, trouble speaking, headaches  Psychiatric depression, anxiety, confusion  Endocrine polyuria, polydipsia, cold intolerance, heat intolerance  Hematologic abnormal bruising, abnormal bleeding, unexplained nose bleeds  Allergic/Immunologic  recurrent infections, hives, swollen lymph nodes     Past Medical History:  He,  has a past medical history of Arthritis, BPH (benign prostatic hyperplasia), Chronic hiccups, COPD (chronic obstructive pulmonary disease) (HCC), Folliculitis, GERD (gastroesophageal reflux disease), Gout, Hypertension, Hypotension, PE (pulmonary thromboembolism) ([place]), Phlebitis, Pneumonia, Pulmonary hypertension ([place]), and Sleep apnea.   Surgical History:   Past Surgical History:  Procedure Laterality Date   BIOPSY  11/03/2020   Procedure: BIOPSY;  Surgeon: Carol Ada, MD;  Location: [facility];  Service: Endoscopy;;   BIOPSY  07/06/2021   Procedure: BIOPSY;  Surgeon: Carol Ada, MD;  Location: [facility];  Service: Endoscopy;;   bone removed from little toe right foot      CHOLECYSTECTOMY     ESOPHAGOGASTRODUODENOSCOPY (EGD) WITH PROPOFOL N/A 11/03/2020   Procedure: ESOPHAGOGASTRODUODENOSCOPY (EGD) WITH PROPOFOL;  Surgeon: Carol Ada, MD;  Location: [facility];  Service: Endoscopy;  Laterality: N/A;   ESOPHAGOGASTRODUODENOSCOPY (EGD) WITH PROPOFOL N/A 07/06/2021   Procedure: ESOPHAGOGASTRODUODENOSCOPY (EGD) WITH PROPOFOL;  Surgeon: Carol Ada, MD;  Location: [facility];  Service: Endoscopy;  Laterality: N/A;   pilonidal cyst removal      RIGHT HEART CATH N/A 12/13/2020   Procedure: RIGHT HEART CATH;  Surgeon: Larey Dresser, MD;  Location: [facility];  Service: Cardiovascular;  Laterality: N/A;   TOTAL KNEE ARTHROPLASTY Left 09/13/2014   Procedure: LEFT TOTAL KNEE ARTHROPLASTY;  Surgeon: Mauri Pole, MD;  Location: [facility];  Service: Orthopedics;  Laterality: Left;     Social History:   reports that he quit smoking about 15 years ago. His smoking use included cigarettes. He started smoking about 67 years ago. He has a 50.00 pack-year smoking history. He has never used smokeless tobacco. He reports that he does not currently use alcohol after a past usage of about 6.0 standard  drinks of alcohol per week. He reports that he does not use drugs.   Family History:  His family history includes Heart attack (age of onset: 51) in his brother.   Allergies Allergies  Allergen Reactions   Other Swelling    Beer- Swelling    Sunflower Oil Swelling   Sulfa Antibiotics Rash     Home Medications  Prior to Admission medications   Medication Sig Start Date End Date Taking? Authorizing Provider  albuterol (VENTOLIN HFA) 108 (90 Base) MCG/ACT inhaler Inhale 1 puff into the lungs every 4 (four) hours as needed for shortness of breath.    [provider]  allopurinol (ZYLOPRIM) 100 MG tablet Take 100 mg by mouth every morning.    [provider]  Azelastine HCl 0.15 % SOLN Place 2 sprays into the nose daily as needed (allergies). 02/22/21   [provider]  Cinnamon 500 MG capsule Take 500 mg by mouth every morning.    [provider]  dapagliflozin propanediol (FARXIGA) 10 MG TABS tablet Take 1 tablet (10 mg total) by mouth daily before breakfast. 03/15/22   Larey Dresser, MD  ELIQUIS 5 MG TABS tablet TAKE 1 TABLET BY MOUTH TWICE A DAY 02/18/22   Larey Dresser, MD  Ensure (ENSURE) Take 237 mLs by mouth daily.    [provider]  finasteride (PROSCAR) 5 MG tablet Take 5 mg by mouth at bedtime.    [provider]  gabapentin (NEURONTIN) 100 MG capsule Take 300 mg by mouth 3 (three) times daily. 11/27/20   [provider]  LINZESS 145 MCG CAPS capsule Take 145 mcg by mouth every morning. 01/18/22   [provider]  magnesium oxide (MAG-OX) 400 MG tablet Take 1 tablet (400 mg total) by mouth daily. 02/18/22   Larey Dresser, MD  metoCLOPramide (REGLAN) 5 MG tablet Take 5 mg by mouth 3 (three) times daily before meals.    [provider]  midodrine (PROAMATINE) 10 MG tablet Take 1.5 tablets (15 mg total) by mouth 3 (three) times daily with meals. 10/17/21   Larey Dresser, MD  Multiple Vitamin  (MULTIVITAMIN WITH MINERALS) TABS tablet Take 1 tablet by mouth daily. 05/01/21   Sheikh, Omair Latif, DO  ondansetron (ZOFRAN) 4 MG tablet Take 1 tablet (4 mg total) by mouth every 6 (six) hours as needed for nausea. 04/30/21   Raiford Noble Latif, DO  OXYGEN Inhale 2 L into the lungs continuous.    [provider]  pantoprazole (PROTONIX) 40 MG tablet Take 40 mg by mouth every morning.    [provider]  potassium chloride (KLOR-CON) 10 MEQ tablet TAKE 2 TABLETS BY MOUTH 2 TIMES DAILY. Patient taking differently: Take 20 mEq by mouth 2 (two) times daily. 09/13/21   Larey Dresser, MD  Riociguat 2 MG TABS Take 2 mg by mouth in the morning, at noon, and at bedtime. 10/08/21   Larey Dresser, MD  STIOLTO RESPIMAT 2.5-2.5 MCG/ACT AERS INHALE 2 PUFFS BY MOUTH INTO THE LUNGS DAILY 11/26/21   [name], MD  torsemide (DEMADEX) 20 MG tablet Take 1.5 tablets (30 mg total) by mouth daily. 03/15/22   Larey Dresser, MD     Critical care time: 33 minutes indpendent of procedures

## 2022-03-16 NOTE — Progress Notes (Signed)
PCP: [name], DO Cardiology: Dr. Burt Knack HF Cardiology: Dr. Aundra Dubin  86 y.o. with history of HFPEF, prior PE, and pulmonary hypertension was referred by Dr. Gasper Sells for evaluation of pulmonary hypertension.  Patient had a PE in 2016.  He is on apixaban. He has a long history of diastolic CHF.  Most recent echo showed a significant component of RV failure with EF 55-60%, IV septum flattened, severe RV enlargement, severely decreased RV function, PASP 57 mmHg.  CTA chest in 4/22 showed no acute PE and mild emphysema, but V/Q scan in 5/22 was suggestive of chronic PE in the right middle lobe.  RHC in 4/22 showed normal filling pressures with moderate PAH. He additionally has chronic hiccups followed by Dr. Benson Norway, now on gabapentin. PFTs in 6/22 showed mixed picture with severe obstruction, moderate restriction, and severely decreased DLCO.   Admitted 8/25-8/30/22 with A/C HF exacerbation. He was aggressively diuresed with lasix/metolazone, had unna boots, farxiga added.  Midodrine was increased for hypotension. Hospitalization c/b AKI on CKD III.  Readmitted 9/1-9/12/22 with shock (mixed septic and cardiogenic), likely 2/2 to aspiration pneumonia and A/C CHF.  Suspect possible gut translocation with ileus/partial SBO; CT abdomen with "nutcracker phenomenon".  He was started on NE + antibiotics. General surgery consulted and felt not to be a surgical candidate. Diuretics initially held due to over-diuresis and shock. Eventually able to wean pressors off, restart PO torsemide and midodrine. Hospitalization c/b transaminitis, delirium, and BPH with urinary retention, requiring foley catheter. Palliative care was consulted for [place] and patient DNR/DNI. PT/OT recommended HH. He was discharged home, weight 220 lbs.  EGD 11/22 showed esophagitis, hiatal hernia and yeast.   Acute visit 07/17/21 for weakness/falls. He had a syncopal episode in clinic. Labs showed hypokalemia of 3.3, SCr elevated at 2.13,  ReDs 27%. [place] stopped, torsemide decreased to 40 mg daily and compression hose and abdominal binder ordered. 3-day Zio placed to evaluate for possible arrhythmogenic cause of symptoms.  Zio monitor showed 19 second run of VT.   Follow up 1/23, rare atypical chest pain. NYHA III, weight continued to trend down.  Admitted 2/23 with PNA and a/c CHF. Seen by palliative care and remained full code.  Today he returns for HF follow up with his brother.  Weight is up about 5 lbs.  He continues to wear home oxygen all the time.  He is short of breath walking up hills and stairs.  Does ok walking on flat ground.  Rare atypical chest pain possibly due to GERD.  Also still has periodic hiccupping.  No lightheadedness.  Torsemide was decreased to 20 mg daily recently.   ECG (personally reviewed): NSR, RBBB  Labs (4/22): K 4.8, creatinine 1.67 Labs (6/22): K 3.8, creatinine 1.72, ANA negative, RF 39.6 but CCP negative, SCL-70 negative, BNP 1231, HIV negative Labs (7/22): K 3.8, creatine 1.67 Labs (8/22): K 3.6, creatinine 1.6, hgb 15.3  Labs (9/22): K 5.0, creatinine 1.2, hgb 14.6 Labs (10/22): K 3.4, creatinine 1.37 Labs (10/22): K 3.5, creatinine 1.34 Labs (12/22): K 4.5, creatinine 1.81 Labs (2/23): K 3.8, creatinine 1.20 Labs (5/23): K 4.3, creatinine 1.5, BNP 84  6 minute walk (7/22): 213 m 6 minute walk (10/22): 274 m (oxygen saturation dropped to 70s on 2L [place])  PMH: 1. HFPEF: With prominent RV failure.   - Echo (2/22): EF 55-60%, IV septum flattened, severe RV enlargement, severely decreased RV function, PASP 57 mmHg.  2. Venous thromboembolic disease: PE in 6063.   - Venous dopplers (  4/22): No DVT.  - CTA chest (4/22): No PE.  - V/Q scan 5/22 with perfusion defect in the RML consistent with chronic PE.  3. OSA: Waiting on CPAP.  4. HTN 5. COPD: Prior smoker.   - CTA chest in 4/22 showed no PE, mild emphysema.  - PFTs (6/22) with severe obstruction, moderate restriction, severely  decreased DLCO 6. Pulmonary hypertension: RHC (4/22) with mean RA 5, PA 65/19 mean 36, mean PCWP 5, CI 2.19, PVR 6.1 WU, PAPi 9.2.  7. Chronic hiccups 8. BPH: Has to in and out cath at times. 9. Syncope: Zio 12/22 with 19 second VT run.  10. Chronic hiccups 11. 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 Stopped working in  2022  Tobacco Use   Smoking status: Former    Packs/day: 1.00    Years: 50.00    Total pack years: 50.00    Types: Cigarettes    Start date: 74    Quit date: 08/19/2006    Years since quitting: 15.5   Smokeless tobacco: Never   Tobacco comments:    Former smoke 03/15/22  Vaping Use   Vaping Use: Never used  Substance and Sexual Activity   Alcohol use: Not Currently    Alcohol/week: 6.0 standard drinks of alcohol    Types: 6 Shots of liquor per week   Drug use: No   Sexual activity: Not on file  Other Topics Concern   Not on file  Social History Narrative   Not on file   Social Determinants of Health   Financial Resource Strain: Not on file  Food Insecurity: No Food Insecurity (05/09/2021)   Hunger Vital Sign    Worried About Running Out of Food in the Last Year: Never true    Ran Out of Food in the Last Year: Never true  Transportation Needs: No Transportation Needs (05/09/2021)   PRAPARE - Hydrologist (Medical): No    Lack of Transportation (Non-Medical): No  Physical Activity: Not on file  Stress: No Stress Concern Present (05/09/2021)   [place]    Feeling of Stress : Only a little  Social  Connections: Not on file  Intimate Partner Violence: Not At Risk (05/09/2021)   Humiliation, Afraid, Rape, and Kick questionnaire    Fear of Current or Ex-Partner: No    Emotionally Abused: No    Physically Abused: No    Sexually Abused: No   Family History  Problem Relation Age of Onset   Heart attack Brother 58   ROS: All systems reviewed and negative except as per HPI.   No current facility-administered medications for this encounter.   No current outpatient medications on file.   Facility-Administered Medications Ordered in Other Encounters  Medication Dose Route Frequency Provider Last Rate Last Admin   0.9 %  sodium chloride infusion  250 mL Intravenous Continuous Candee Furbish, MD       arformoterol [facility]) nebulizer solution 15 mcg  15 mcg Nebulization BID Candee Furbish, MD       bisacodyl (DULCOLAX) suppository 10 mg  10 mg Rectal QHS Candee Furbish, MD       [START ON 03/17/2022] Chlorhexidine Gluconate Cloth 2 % PADS 6 each  6 each Topical Q0600 Candee Furbish, MD       docusate sodium (COLACE) capsule 100 mg  100 mg Oral BID PRN Candee Furbish, MD       doxycycline (VIBRAMYCIN) 100 mg in sodium chloride 0.9 % 250 mL IVPB  100 mg Intravenous Q12H Candee Furbish, MD       heparin ADULT infusion 100 units/mL (25000 units/281m)  1,350 Units/hr Intravenous Continuous SCandee Furbish MD       insulin aspart (novoLOG) injection 0-6 Units  0-6 Units Subcutaneous Q4H SCandee Furbish MD       lactated ringers infusion   Intravenous Continuous Tegeler, CGwenyth Allegra MD 150 mL/hr at 03/16/22 1018 New Bag at 03/16/22 1018   magnesium sulfate IVPB 2 g 50 mL  2 g Intravenous Once SCandee Furbish MD       norepinephrine (LEVOPHED) 470min 25043m0.016 mg/mL) premix infusion  2-10 mcg/min Intravenous Titrated SmiCandee FurbishD       pantoprazole (PROTONIX) injection 40 mg  40 mg Intravenous QHS SmiCandee FurbishD       piperacillin-tazobactam (ZOSYN) IVPB 3.375 g  3.375 g  Intravenous Q8H Tegeler, ChrGwenyth AllegraD       polyethylene glycol (MIRALAX / GLYCOLAX) packet 17 g  17 g Oral Daily PRN SmiCandee FurbishD       promethazine (PHENERGAN) 6.25 mg in sodium chloride 0.9 % 50 mL IVPB  6.25 mg Intravenous Q6H PRN SmiCandee FurbishD       revefenacin (YUPELRI) nebulizer solution 175 mcg  175 mcg Nebulization Daily SmiCandee FurbishD       vancomycin variable dose per unstable renal function (pharmacist dosing)   Does not apply See admin instructions [facility]    Wt Readings from Last 3 Encounters:  03/15/22 81.3 kg (179 lb 3.2 oz)  03/13/22 80.7 kg (177 lb 14.6 oz)  12/26/21 77.8 kg (171 lb 9.6 oz)   BP 108/68   Pulse 75   Ht 6' (1.829 m)   Wt 81.3 kg (179 lb 3.2 oz)   SpO2 90%   BMI 24.30 kg/m  General: NAD Neck: JVP 8-9 cm, no thyromegaly or thyroid nodule.  Lungs: Distant BS CV: Nondisplaced PMI.  Heart regular S1/S2, no S3/S4, no murmur.  1+ ankle edema.  No carotid bruit.  Normal pedal pulses.  Abdomen: Soft, nontender, no hepatosplenomegaly, no distention.  Skin: Intact without lesions or rashes.  Neurologic: Alert and oriented x 3.  Psych: Normal affect. Extremities: No clubbing or cyanosis.  HEENT: Normal.   Assessment/Plan: 1. Chronic HFpEF/RV failure: Echo (2/22) with EF 55-60%, IV septum flattened, severe RV enlargement, severely decreased RV function, PASP 57 mmHg.  He has severe RV failure.  Chronically NYHA III. He is mildly volume overloaded since decreasing torsemide, weight is up about 5 lbs.  - Increase torsemide to 30 mg daily.  BMET today and in 10 days.  - Continue midodrine 15 mg tid. Would avoid fludrocortisone. - Can restart dapagliflozin 10 mg daily.  - Compression hose for orthostasis, also has abdominal binder but has not needed.  2. Pulmonary HTN: PAH noted on 4/22 RHC with PVR 6.1 WU.  This appears to be multifactorial with OSA, severe emphysema, and a suspected chronic PE  involving the right middle  lobe (group 3 and group 4 PH). Given the suspected mixed etiology with only 1 area of chronic thromboembolism (right middle lobe) as well as age, do not think that pulmonary thromboendarterectomy would be indicated.  Rheumatologic serologic workup was negative.  PFTs showed severe obstruction and moderate restriction, suggesting significant COPD.  - Continue riociguat for CTEPH, on 2 mg tid.  - Currently on 2L oxygen chronically. 3. Stage III CKD: BMET today. 4. OSA: Moderate OSA on sleep study. He refused CPAP. 5. Emphysema: Prior smoker.  Emphysema on CT and severe obstruction on PFTs - He uses home oxygen as above.   - Follows with pulmonology.  6. Chronic PE: Diagnosed by V/Q scan.  No abnormal bleeding.  - Continue Eliquis.  7. Hiatial hernia w/ esophagitis: Has significant GERD. He follows with Dr. Benson Norway. - Continue PPI.   Followup 1 month APP.   Loralie Champagne 03/14/22

## 2022-03-16 NOTE — Progress Notes (Signed)
ANTICOAGULATION CONSULT NOTE - Follow Up Consult  Pharmacy Consult for Heparin Indication: pulmonary embolus  Allergies  Allergen Reactions   Other Swelling    Beer- Swelling    Sunflower Oil Swelling   Sulfa Antibiotics Rash    Patient Measurements: Weight: 82.3 kg (181 lb 7 oz) Heparin Dosing Weight: 81.3 kg  Vital Signs: Temp: 98 F (36.7 C) (07/29 1926) Temp Source: Oral (07/29 1926) BP: 92/62 (07/29 2300) Pulse Rate: 77 (07/29 2300)  Labs: Recent Labs    03/15/22 0952 03/16/22 0914 03/16/22 0914 03/16/22 1140 03/16/22 1746 03/16/22 2133  HGB  --  11.2*  --   --   --   --   HCT  --  35.4*  --   --   --   --   PLT  --  156  --   --   --   --   APTT  --  36  --   --   --  98*  LABPROT  --  24.5*  --   --   --   --   INR  --  2.2*  --   --   --   --   HEPARINUNFRC  --   --   --   --  >1.10* >1.10*  CREATININE 1.78* 3.06*  --   --   --   --   TROPONINIHS  --  633*   < > 3,171* 6,913* 8,808*   < > = values in this interval not displayed.     Estimated Creatinine Clearance: 19.7 mL/min (A) (by C-G formula based on SCr of 3.06 mg/dL (H)).   Medical History: Past Medical History:  Diagnosis Date   Arthritis    BPH (benign prostatic hyperplasia)    Chronic hiccups    COPD (chronic obstructive pulmonary disease) (HCC)    Folliculitis    posterior scalp per office visit note of Dr Maudie Mercury 07/20/2014     GERD (gastroesophageal reflux disease)    Gout    Hypertension    Hypotension    PE (pulmonary thromboembolism) ([place])    Phlebitis    right arm  at least 20 years ago    Pneumonia    hx of pneumonia as a child    Pulmonary hypertension ([place])    Sleep apnea     Medications:  Medications Prior to Admission  Medication Sig Dispense Refill Last Dose   albuterol (VENTOLIN HFA) 108 (90 Base) MCG/ACT inhaler Inhale 1 puff into the lungs every 4 (four) hours as needed for shortness of breath.   unknown   allopurinol (ZYLOPRIM) 100 MG tablet Take 100 mg by mouth  every morning.   03/15/2022   Azelastine HCl 0.15 % SOLN Place 2 sprays into the nose daily as needed (allergies).   unknown   Cinnamon 500 MG capsule Take 500 mg by mouth every morning.   03/15/2022   doxycycline (VIBRAMYCIN) 100 MG capsule Take 100 mg by mouth daily as needed (infection).   unknown   ELIQUIS 5 MG TABS tablet TAKE 1 TABLET BY MOUTH TWICE A DAY 60 tablet 11 03/15/2022 at 5:30pm   finasteride (PROSCAR) 5 MG tablet Take 5 mg by mouth at bedtime.   Past Week   gabapentin (NEURONTIN) 100 MG capsule Take 300 mg by mouth 3 (three) times daily.   03/15/2022   magnesium oxide (MAG-OX) 400 MG tablet Take 1 tablet (400 mg total) by mouth daily. 30 tablet 3 03/15/2022   metoCLOPramide (  REGLAN) 5 MG tablet Take 5 mg by mouth 3 (three) times daily before meals.   03/15/2022   midodrine (PROAMATINE) 10 MG tablet Take 1.5 tablets (15 mg total) by mouth 3 (three) times daily with meals. 90 tablet 3 03/15/2022   Multiple Vitamin (MULTIVITAMIN WITH MINERALS) TABS tablet Take 1 tablet by mouth daily. 30 tablet 0 03/15/2022   ondansetron (ZOFRAN) 4 MG tablet Take 1 tablet (4 mg total) by mouth every 6 (six) hours as needed for nausea. 20 tablet 0 unknown   OXYGEN Inhale 2 L into the lungs continuous.   continuous   pantoprazole (PROTONIX) 40 MG tablet Take 40 mg by mouth every morning.   03/15/2022   potassium chloride (KLOR-CON) 10 MEQ tablet TAKE 2 TABLETS BY MOUTH 2 TIMES DAILY. (Patient taking differently: Take 20 mEq by mouth 2 (two) times daily.) 30 tablet 6 03/15/2022   Riociguat 2 MG TABS Take 2 mg by mouth in the morning, at noon, and at bedtime. 90 tablet 11 03/15/2022 at 5:30pm   STIOLTO RESPIMAT 2.5-2.5 MCG/ACT AERS INHALE 2 PUFFS BY MOUTH INTO THE LUNGS DAILY (Patient taking differently: Inhale 2 each into the lungs daily.) 4 g 5 03/15/2022   torsemide (DEMADEX) 20 MG tablet Take 1.5 tablets (30 mg total) by mouth daily. (Patient taking differently: Take 40 mg by mouth daily.) 180 tablet 2 03/15/2022    dapagliflozin propanediol (FARXIGA) 10 MG TABS tablet Take 1 tablet (10 mg total) by mouth daily before breakfast. 90 tablet 3 new rx    Scheduled:   arformoterol  15 mcg Nebulization BID   bisacodyl  10 mg Rectal QHS   [START ON 03/17/2022] Chlorhexidine Gluconate Cloth  6 each Topical Q0600   insulin aspart  0-6 Units Subcutaneous Q4H   pantoprazole (PROTONIX) IV  40 mg Intravenous QHS   revefenacin  175 mcg Nebulization Daily   vancomycin variable dose per unstable renal function (pharmacist dosing)   Does not apply See admin instructions   Infusions:   sodium chloride Stopped (03/16/22 1753)   doxycycline (VIBRAMYCIN) IV Stopped (03/16/22 1743)   heparin 1,350 Units/hr (03/16/22 2200)   norepinephrine (LEVOPHED) Adult infusion 4 mcg/min (03/16/22 2200)   piperacillin-tazobactam (ZOSYN)  IV 12.5 mL/hr at 03/16/22 2200   promethazine (PHENERGAN) injection (IM or IVPB)     PRN: docusate sodium, polyethylene glycol, promethazine (PHENERGAN) injection (IM or IVPB)  Assessment: 45 yom with a history of PE on eliquis, COPD, HF, sleep apnea, CKD, GERD. Patient is presenting with sudden onset N/V and hiccups which progressed to SOB, fevers, elevated lactates, and AKI/ALF. Heparin per pharmacy consult placed for pulmonary embolus.  Patient is on apixaban prior to arrival. Last dose 7/28pm. Will require aPTT monitoring due to likely falsely high anti-Xa level secondary to DOAC use.  Heparin level > 1.1 as expected due to previous apixaban. aPTT 98 is therapeutic on heparin 1350 units/hr though at upper end of range. Level drawn appropriately. No bleeding noted per RN.   Goal of Therapy:  Heparin level 0.3-0.7 units/ml aPTT 66-102 seconds Monitor platelets by anticoagulation protocol: Yes   Plan:  Decrease heparin to 1300 units/hr to ensure remains in therapeutic range  Follow up AM heparin level and aPTT  Continue to monitor via aPTT until levels are correlated Continue to monitor  H&H and platelets  Cristela Felt, PharmD, BCPS Clinical Pharmacist 03/16/2022 11:08 PM

## 2022-03-16 NOTE — Progress Notes (Signed)
ANTICOAGULATION CONSULT NOTE - Initial Consult  Pharmacy Consult for Heparin Indication: pulmonary embolus  Allergies  Allergen Reactions   Other Swelling    Beer- Swelling    Sunflower Oil Swelling   Sulfa Antibiotics Rash    Patient Measurements:   Heparin Dosing Weight: 81.3 kg  Vital Signs: Temp: 102 F (38.9 C) (07/29 0901) Temp Source: Oral (07/29 0901) BP: 106/64 (07/29 1200) Pulse Rate: 94 (07/29 1200)  Labs: Recent Labs    03/15/22 0952 03/16/22 0914  HGB  --  11.2*  HCT  --  35.4*  PLT  --  156  APTT  --  36  LABPROT  --  24.5*  INR  --  2.2*  CREATININE 1.78* 3.06*  TROPONINIHS  --  633*    Estimated Creatinine Clearance: 19.7 mL/min (A) (by C-G formula based on SCr of 3.06 mg/dL (H)).   Medical History: Past Medical History:  Diagnosis Date   Arthritis    BPH (benign prostatic hyperplasia)    Chronic hiccups    COPD (chronic obstructive pulmonary disease) (HCC)    Folliculitis    posterior scalp per office visit note of Dr Maudie Mercury 07/20/2014     GERD (gastroesophageal reflux disease)    Gout    Hypertension    Hypotension    PE (pulmonary thromboembolism) ([place])    Phlebitis    right arm  at least 20 years ago    Pneumonia    hx of pneumonia as a child    Pulmonary hypertension ([place])    Sleep apnea     Medications:  (Not in a hospital admission)  Scheduled:   arformoterol  15 mcg Nebulization BID   bisacodyl  10 mg Rectal QHS   insulin aspart  0-6 Units Subcutaneous Q4H   revefenacin  175 mcg Nebulization Daily   vancomycin variable dose per unstable renal function (pharmacist dosing)   Does not apply See admin instructions   Infusions:   sodium chloride     doxycycline (VIBRAMYCIN) IV     lactated ringers 150 mL/hr at 03/16/22 1018   norepinephrine (LEVOPHED) Adult infusion     piperacillin-tazobactam (ZOSYN)  IV     promethazine (PHENERGAN) injection (IM or IVPB)     PRN: promethazine (PHENERGAN) injection (IM or  IVPB)  Assessment: 64 yom with a history of PE on eliquis, COPD, HF, sleep apnea, CKD, GERD. Patient is presenting with sudden onset N/V and hiccups which progressed to SOB, fevers, elevated lactates, and AKI/ALF. Heparin per pharmacy consult placed for pulmonary embolus.  Patient is on apixaban prior to arrival. Last dose 7/28pm. Will require aPTT monitoring due to likely falsely high anti-Xa level secondary to DOAC use.  Hgb 11.2; plt 156 PT/INR 24.5 / 2.2 aPTT 36  Goal of Therapy:  Heparin level 0.3-0.7 units/ml aPTT 66-102 seconds Monitor platelets by anticoagulation protocol: Yes   Plan:  No initial heparin bolus Start heparin infusion at 1350 units/hr Check aPTT & anti-Xa level at 2200 and daily while on heparin Continue to monitor via aPTT until levels are correlated Continue to monitor H&H and platelets  Lorelei Pont, PharmD, BCPS 03/16/2022 12:45 PM ED Clinical Pharmacist -  [phone]

## 2022-03-16 NOTE — ED Triage Notes (Signed)
Near syncope from home. Hypotensive with bp 80/60. 500cc NS bolus by EMS. Alert and oriented x 4. HR at 130s. Also reports sob with O2 84% on RA. Temp 102.0 orally.

## 2022-03-16 NOTE — ED Notes (Addendum)
Trop 633  Lactic  6.8  Both criticals reported to primary RN Katrina

## 2022-03-16 NOTE — Progress Notes (Signed)
There is order for vasopressors, but patient's BP is okay and patient's RN stated that patient is not receiving of vasopressors at this time. Informed RN that put in the consult if patient is getting vasopressors. HS [employer]

## 2022-03-16 NOTE — ED Provider Notes (Signed)
[facility] EMERGENCY DEPARTMENT Provider Note   CSN: [id number] Arrival date & time: 03/16/22  0850     History  Chief Complaint  Patient presents with   Near Syncope    Joshua [name] is a 86 y.o. male.  The history is provided by the patient and medical records. No language interpreter was used.  Near Syncope This is a new problem. The current episode started 1 to 2 hours ago. The problem occurs rarely. The problem has not changed since onset.Associated symptoms include chest pain and shortness of breath. Pertinent negatives include no abdominal pain and no headaches. Nothing aggravates the symptoms. Nothing relieves the symptoms. He has tried nothing for the symptoms. The treatment provided no relief.       Home Medications Prior to Admission medications   Medication Sig Start Date End Date Taking? Authorizing Provider  albuterol (VENTOLIN HFA) 108 (90 Base) MCG/ACT inhaler Inhale 1 puff into the lungs every 4 (four) hours as needed for shortness of breath.    [provider]  allopurinol (ZYLOPRIM) 100 MG tablet Take 100 mg by mouth every morning.    [provider]  Azelastine HCl 0.15 % SOLN Place 2 sprays into the nose daily as needed (allergies). 02/22/21   [provider]  Cinnamon 500 MG capsule Take 500 mg by mouth every morning.    [provider]  dapagliflozin propanediol (FARXIGA) 10 MG TABS tablet Take 1 tablet (10 mg total) by mouth daily before breakfast. 03/15/22   Larey Dresser, MD  ELIQUIS 5 MG TABS tablet TAKE 1 TABLET BY MOUTH TWICE A DAY 02/18/22   Larey Dresser, MD  Ensure (ENSURE) Take 237 mLs by mouth daily.    [provider]  finasteride (PROSCAR) 5 MG tablet Take 5 mg by mouth at bedtime.    [provider]  gabapentin (NEURONTIN) 100 MG capsule Take 300 mg by mouth 3 (three) times daily. 11/27/20   [provider]  LINZESS 145 MCG CAPS capsule Take 145 mcg by mouth  every morning. 01/18/22   [provider]  magnesium oxide (MAG-OX) 400 MG tablet Take 1 tablet (400 mg total) by mouth daily. 02/18/22   Larey Dresser, MD  metoCLOPramide (REGLAN) 5 MG tablet Take 5 mg by mouth 3 (three) times daily before meals.    [provider]  midodrine (PROAMATINE) 10 MG tablet Take 1.5 tablets (15 mg total) by mouth 3 (three) times daily with meals. 10/17/21   Larey Dresser, MD  Multiple Vitamin (MULTIVITAMIN WITH MINERALS) TABS tablet Take 1 tablet by mouth daily. 05/01/21   Sheikh, Omair Latif, DO  ondansetron (ZOFRAN) 4 MG tablet Take 1 tablet (4 mg total) by mouth every 6 (six) hours as needed for nausea. 04/30/21   Raiford Noble Latif, DO  OXYGEN Inhale 2 L into the lungs continuous.    [provider]  pantoprazole (PROTONIX) 40 MG tablet Take 40 mg by mouth every morning.    [provider]  potassium chloride (KLOR-CON) 10 MEQ tablet TAKE 2 TABLETS BY MOUTH 2 TIMES DAILY. Patient taking differently: Take 20 mEq by mouth 2 (two) times daily. 09/13/21   Larey Dresser, MD  Riociguat 2 MG TABS Take 2 mg by mouth in the morning, at noon, and at bedtime. 10/08/21   Larey Dresser, MD  STIOLTO RESPIMAT 2.5-2.5 MCG/ACT AERS INHALE 2 PUFFS BY MOUTH INTO THE LUNGS DAILY 11/26/21   [name], MD  torsemide (DEMADEX) 20 MG tablet Take 1.5 tablets (30 mg total) by mouth daily. 03/15/22   Larey Dresser, MD      Allergies    Other, Sunflower oil, and Sulfa antibiotics    Review of Systems   Review of Systems  Constitutional:  Positive for chills, fatigue and fever.  HENT:  Negative for congestion.   Respiratory:  Positive for cough, chest tightness and shortness of breath. Negative for wheezing.   Cardiovascular:  Positive for chest pain and near-syncope. Negative for palpitations and leg swelling.  Gastrointestinal:  Negative for abdominal pain, constipation, diarrhea, nausea and vomiting.  Musculoskeletal:  Negative for back  pain, neck pain and neck stiffness.  Skin:  Negative for rash and wound.  Neurological:  Positive for light-headedness. Negative for dizziness, syncope (near syncope per EMS), weakness, numbness and headaches.  Psychiatric/Behavioral:  Positive for confusion (improved per EMS). Negative for agitation.   All other systems reviewed and are negative.   Physical Exam Updated Vital Signs BP (!) 88/56 (BP Location: Right Arm)   Pulse (!) 133   Temp (!) 102 F (38.9 C) (Oral)   Resp 16   SpO2 (!) 84%  Physical Exam Vitals and nursing note reviewed.  Constitutional:      General: He is in acute distress.     Appearance: He is well-developed. He is ill-appearing. He is not toxic-appearing or diaphoretic.  HENT:     Head: Normocephalic and atraumatic.     Nose: No congestion.     Mouth/Throat:     Mouth: Mucous membranes are dry.  Eyes:     Extraocular Movements: Extraocular movements intact.     Conjunctiva/sclera: Conjunctivae normal.     Pupils: Pupils are equal, round, and reactive to light.  Cardiovascular:     Rate and Rhythm: Regular rhythm. Tachycardia present.     Heart sounds: No murmur heard. Pulmonary:     Effort: Tachypnea present.     Breath sounds: Rhonchi present. No wheezing or rales.  Chest:     Chest wall: No tenderness.  Abdominal:     General: Abdomen is flat.     Palpations: Abdomen is soft.     Tenderness: There is no abdominal tenderness. There is no guarding or rebound.  Musculoskeletal:        General: No swelling or tenderness.     Cervical back: Neck supple. No tenderness.     Right lower leg: No edema.     Left lower leg: No edema.  Skin:    General: Skin is warm and dry.     Capillary Refill: Capillary refill takes less than 2 seconds.     Findings: No erythema or rash.  Neurological:     General: No focal deficit present.     Mental Status: He is alert.  Psychiatric:        Mood and Affect: Mood normal.     ED Results / Procedures /  Treatments   Labs (all labs ordered are listed, but only abnormal results are displayed) Labs Reviewed  LACTIC ACID, PLASMA - Abnormal; Notable for the following components:      Result Value   Lactic Acid, Venous 6.8 (*)    All other components within normal limits  LACTIC ACID, PLASMA - Abnormal; Notable for the following components:   Lactic Acid, Venous 5.5 (*)    All other components within normal limits  COMPREHENSIVE METABOLIC PANEL - Abnormal; Notable for the following components:   Potassium  5.4 (*)    Chloride 97 (*)    Glucose, Bld 119 (*)    BUN 37 (*)    Creatinine, Ser 3.06 (*)    Albumin 3.4 (*)    AST 359 (*)    ALT 272 (*)    Total Bilirubin 1.7 (*)    GFR, Estimated 19 (*)    Anion gap 16 (*)    All other components within normal limits  CBC WITH DIFFERENTIAL/PLATELET - Abnormal; Notable for the following components:   WBC 15.6 (*)    RBC 3.94 (*)    Hemoglobin 11.2 (*)    HCT 35.4 (*)    Neutro Abs 14.3 (*)    Lymphs Abs 0.4 (*)    Abs Immature Granulocytes 0.11 (*)    All other components within normal limits  PROTIME-INR - Abnormal; Notable for the following components:   Prothrombin Time 24.5 (*)    INR 2.2 (*)    All other components within normal limits  URINALYSIS, ROUTINE W REFLEX MICROSCOPIC - Abnormal; Notable for the following components:   APPearance HAZY (*)    Hgb urine dipstick LARGE (*)    Protein, ur 30 (*)    Bacteria, UA MANY (*)    All other components within normal limits  BRAIN NATRIURETIC PEPTIDE - Abnormal; Notable for the following components:   B Natriuretic Peptide 380.3 (*)    All other components within normal limits  TROPONIN I (HIGH SENSITIVITY) - Abnormal; Notable for the following components:   Troponin I (High Sensitivity) 633 (*)    All other components within normal limits  TROPONIN I (HIGH SENSITIVITY) - Abnormal; Notable for the following components:   Troponin I (High Sensitivity) 3,171 (*)    All other  components within normal limits  RESP PANEL BY RT-PCR (FLU A&B, COVID) ARPGX2  MRSA NEXT GEN BY PCR, NASAL  CULTURE, BLOOD (ROUTINE X 2)  CULTURE, BLOOD (ROUTINE X 2)  URINE CULTURE  APTT  LIPASE, BLOOD  HEMOGLOBIN A1C  LEGIONELLA PNEUMOPHILA SEROGP 1 UR AG  STREP PNEUMONIAE URINARY ANTIGEN  HEPARIN LEVEL (UNFRACTIONATED)  APTT  HEPARIN LEVEL (UNFRACTIONATED)  LACTIC ACID, PLASMA  LACTIC ACID, PLASMA  TROPONIN I (HIGH SENSITIVITY)    EKG EKG Interpretation  Date/Time:  Saturday March 16 2022 09:00:38 EDT Ventricular Rate:  132 PR Interval:  117 QRS Duration: 162 QT Interval:  414 QTC Calculation: 614 R Axis:   79 Text Interpretation: Sinus tachycardia Right bundle branch block when compared to prior, faster rate. No STEMI Confirmed by Antony Blackbird [phone]) on 03/16/2022 9:06:05 AM  Radiology US Abdomen Limited RUQ (LIVER/GB)  Result Date: 03/16/2022 CLINICAL DATA:  85 year old male with elevated LFTs. History of cholecystectomy. EXAM: ULTRASOUND ABDOMEN LIMITED RIGHT UPPER QUADRANT COMPARISON:  04/19/2021 ultrasound.  Prior CTs. FINDINGS: Gallbladder: Not visualized compatible with history of cholecystectomy. Common bile duct: Diameter: 2.3 mm. There is no evidence of intrahepatic or extrahepatic biliary dilatation. Liver: No suspicious focal lesions identified. Echogenicity is within normal limits. A few tiny cysts are present. Portal vein is patent on color Doppler imaging with normal direction of blood flow towards the liver. Other: A very small amount of perihepatic fluid is again noted. IMPRESSION: 1. No acute abnormality. No significant hepatic abnormalities. No biliary dilatation. 2. Very small amount of perihepatic ascites again noted. 3. Status post cholecystectomy. Electronically Signed   By: Margarette Canada M.D.   On: 03/16/2022 13:18   VAS [place] LOWER EXTREMITY VENOUS (DVT) (ONLY [facility] & [facility])  Result Date: 03/16/2022  Lower Venous DVT Study Patient Name:  Joshua [name]  Date  of Exam:   03/16/2022 Medical Rec #: [id number]         Accession #:    [id number] Date of Birth: 14-Jan-1936          Patient Gender: M Patient Age:   45 years Exam Location:  [facility] Procedure:      VAS [place] LOWER EXTREMITY VENOUS (DVT) Referring Phys: Marda Stalker --------------------------------------------------------------------------------  Indications: Pain and swelling right leg, SOB.  Comparison Study: 12-02-2020 Prior bilateral lower extremity venous was negative                   for DVT. Performing Technologist: Darlin Coco RDMS, RVT  Examination Guidelines: A complete evaluation includes B-mode imaging, spectral Doppler, color Doppler, and power Doppler as needed of all accessible portions of each vessel. Bilateral testing is considered an integral part of a complete examination. Limited examinations for reoccurring indications may be performed as noted. The reflux portion of the exam is performed with the patient in reverse Trendelenburg.  +---------+---------------+---------+-----------+----------+-----------------+ RIGHT    CompressibilityPhasicitySpontaneityPropertiesThrombus Aging    +---------+---------------+---------+-----------+----------+-----------------+ CFV      Full           Yes      Yes                                    +---------+---------------+---------+-----------+----------+-----------------+ SFJ      Full                                                           +---------+---------------+---------+-----------+----------+-----------------+ FV Prox  Full                                                           +---------+---------------+---------+-----------+----------+-----------------+ FV Mid   Full                                                           +---------+---------------+---------+-----------+----------+-----------------+ FV DistalFull                                                            +---------+---------------+---------+-----------+----------+-----------------+ PFV      Full                                                           +---------+---------------+---------+-----------+----------+-----------------+ POP      Full  Yes      Yes                                    +---------+---------------+---------+-----------+----------+-----------------+ PTV      Full                                                           +---------+---------------+---------+-----------+----------+-----------------+ PERO     Full                                                           +---------+---------------+---------+-----------+----------+-----------------+ Gastroc  Partial        Yes      Yes                  Age Indeterminate +---------+---------------+---------+-----------+----------+-----------------+   +----+---------------+---------+-----------+----------+--------------+ LEFTCompressibilityPhasicitySpontaneityPropertiesThrombus Aging +----+---------------+---------+-----------+----------+--------------+ CFV Full           Yes      Yes                                 +----+---------------+---------+-----------+----------+--------------+    Summary: RIGHT: - Findings consistent with age indeterminate deep vein thrombosis involving the right gastrocnemius veins. - No cystic structure found in the popliteal fossa.  LEFT: - No evidence of common femoral vein obstruction.  *See table(s) above for measurements and observations. Electronically signed by Jamelle Haring on 03/16/2022 at 12:11:03 PM.    Final    CT CHEST ABDOMEN PELVIS WO CONTRAST  Result Date: 03/16/2022 CLINICAL DATA:  Sepsis EXAM: CT CHEST, ABDOMEN AND PELVIS WITHOUT CONTRAST TECHNIQUE: Multidetector CT imaging of the chest, abdomen and pelvis was performed following the standard protocol without IV contrast. RADIATION DOSE REDUCTION: This exam was performed according to the  departmental dose-optimization program which includes automated exposure control, adjustment of the mA and/or kV according to patient size and/or use of iterative reconstruction technique. COMPARISON:  Previous CT of the abdomen and pelvis dated Dec 25, 2021 and CT of the chest dated December 13, 2021 FINDINGS: CT CHEST FINDINGS Cardiovascular: Moderate atheromatous calcifications of the arch of the aorta. Ascending thoracic aorta has a normal appearance. Moderately severe atheromatous calcifications of the coronary arteries. Pulmonary artery measures 3.6 cm likely reflecting pulmonary hypertension. Moderate cardiomegaly. No pericardial effusion. Mediastinum/Nodes: No enlarged mediastinal, hilar, or axillary lymph nodes. Thyroid gland, and trachea demonstrate no significant findings. Small to moderate hiatal hernia. Lungs/Pleura: Severe centrilobular emphysema throughout the lungs and is more prominent at the upper lobes. There is moderate consolidative airspace disease seen at the right lung base with air bronchograms with some pleural superimposed atelectasis and is new. Musculoskeletal: Moderate thoracic spondylosis. CT ABDOMEN PELVIS FINDINGS Hepatobiliary: No focal liver abnormality is seen. Status post cholecystectomy. No biliary dilatation. Pancreas: Unremarkable. No pancreatic ductal dilatation or surrounding inflammatory changes. Spleen: Normal in size without focal abnormality. Adrenals/Urinary Tract: Adrenal glands are unremarkable. Kidneys are normal, without renal calculi, focal lesion, or hydronephrosis except for stable couple of cysts in the left kidney. Bladder is unremarkable.  Stomach/Bowel: Bowel-gas pattern is nonobstructive. Appendix has a normal appearance. There is an apparent mesenteric stranding seen at the right lower abdomen-pelvis and is more conspicuous in the present study (images 96 through 100/3). Moderate stool burden in the sigmoid and rectum. Vascular/Lymphatic: Again seen are the  extensive atheromatous calcifications of the abdominal aorta extending into the iliac arteries. Infrarenal abdominal aortic aneurysm measuring 3.6 cm in greatest diameter, stable. No enlarged abdominal or pelvic lymph nodes. Reproductive: Stable markedly enlarged prostate gland measuring 6.4 x 5.7 cm. Other: There is abdominal wall hernia containing small segment of the bowel loop without bowel obstruction. No stranding seen in the hernial sac. Musculoskeletal: No acute or significant osseous findings. IMPRESSION: 1. There is moderate consolidative airspace disease seen at the right lung base and is new of likely pneumonia. There is some superimposed atelectasis and pleural thickening seen at the right lung base. No pleural effusion. 2. Moderate cardiomegaly. Severe atheromatous calcifications of the coronary arteries. 3. Extensive atheromatous calcifications of the abdominal aorta and iliac arteries. Stable small infrarenal abdominal aortic aneurysm. 4. Apparent mild mesenteric stranding at the right lower abdomen and pelvis. Appendix has a normal appearance. No free fluid or abscess in the peritoneal cavity. 5.  Stable severe prostatomegaly. 6.  Moderate stool burden in the sigmoid and rectum Electronically Signed   By: Frazier Richards M.D.   On: 03/16/2022 11:52   DG Chest Port 1 View  Result Date: 03/16/2022 CLINICAL DATA:  Questionable sepsis - evaluate for abnormality EXAM: PORTABLE CHEST 1 VIEW COMPARISON:  10/04/2021, 12/13/2021 FINDINGS: Mild cardiomegaly. Aortic atherosclerosis. Mild streaky bibasilar opacities. Emphysematous changes within both lungs. No pleural effusion or pneumothorax. IMPRESSION: 1. Mild streaky bibasilar opacities could represent atelectasis or infection. 2. Mild cardiomegaly. 3. Emphysema. Electronically Signed   By: Davina Poke D.O.   On: 03/16/2022 10:03    Procedures Procedures    CRITICAL CARE Performed by: Gwenyth Allegra Barnett Elzey Total critical care time: 45  minutes Critical care time was exclusive of separately billable procedures and treating other patients. Critical care was necessary to treat or prevent imminent or life-threatening deterioration. Critical care was time spent personally by me on the following activities: development of treatment plan with patient and/or surrogate as well as nursing, discussions with consultants, evaluation of patient's response to treatment, examination of patient, obtaining history from patient or surrogate, ordering and performing treatments and interventions, ordering and review of laboratory studies, ordering and review of radiographic studies, pulse oximetry and re-evaluation of patient's condition.   Medications Ordered in ED Medications  lactated ringers infusion ( Intravenous New Bag/Given 03/16/22 1018)  vancomycin variable dose per unstable renal function (pharmacist dosing) (has no administration in time range)  lactated ringers bolus 1,000 mL (0 mLs Intravenous Stopped 03/16/22 0958)    And  lactated ringers bolus 1,000 mL (0 mLs Intravenous Stopped 03/16/22 1017)    And  lactated ringers bolus 500 mL (0 mLs Intravenous Stopped 03/16/22 1017)  ceFEPIme (MAXIPIME) 2 g in sodium chloride 0.9 % 100 mL IVPB (0 g Intravenous Stopped 03/16/22 0957)  metroNIDAZOLE (FLAGYL) IVPB 500 mg (0 mg Intravenous Stopped 03/16/22 1018)  vancomycin (VANCOREADY) IVPB 1500 mg/300 mL (1,500 mg Intravenous New Bag/Given 03/16/22 0930)  trimethobenzamide (TIGAN) injection 200 mg (200 mg Intramuscular Given 03/16/22 6010)    ED Course/ Medical Decision Making/ A&P                           Medical  Decision Making Amount and/or Complexity of Data Reviewed Labs: ordered. Radiology: ordered. ECG/medicine tests: ordered.  Risk Prescription drug management. Decision regarding hospitalization.    Joshua [name] is a 86 y.o. male with a past medical history significant for previous pulmonary embolism on Eliquis, COPD, CHF,  sleep apnea, CKD, GERD, and previous pneumonia who presents with altered mental status, near syncope, and hypoxia.  According to EMS, patient was found altered with confusion today and was also found to have hypoxia in the low 80s on room air.  Patient placed on nonrebreather and brought in for evaluation.  EMS that he was hypotensive with a blood pressure of 80, he was warm to the touch although they did not get a temperature, and was tachycardic in the 120s.  He was also tachypneic.  Patient says that he has been having some cough and fatigue but denies any fevers at home.  He denies nausea, vomiting, constipation, diarrhea, or urinary changes.  He denies any upper extremity pains but does report some new pain in his right leg recently.  He says that he is been taking all of his blood thinners for PE as directed.  He says that he has had a dry cough and he does have chest tightness.  He denies any abdominal pain, back, or flank pains.  On my initial exam, he is very warm to the touch and was found to orally have a temperature of 102.0.  He was also hypotensive with blood pressures in the 80s, hypoxic in the mid 80s when he was assessed on room air, tach in the 130s, and tachypneic.  We will make him a code sepsis with concern for recurrent pneumonia.  Otherwise on my exam his lungs had coarse breath sounds bilaterally and chest was nontender.  Abdomen was nontender and had intact bowel sounds.  Legs were minimally edematous and had intact sensation, strength, and pulses.  Patient is now on 4 L to maintain oxygen saturations.  We will order the code sepsis labs as well as assessing his heart given the chest tightness and shortness of breath.  We will get troponin, BNP given his heart failure although we will still give fluids for the hypotension and concern for sepsis.  We will order a CT PE study if his kidneys can tolerate it given his report of right leg pain and new chest tightness and shortness of  breath.  If he cannot get a CT PE study due to kidney abnormalities, he may need VQ scan during his admission although this may be complicated if he has pneumonia on x-ray.  Patient will need admission after work-up is completed.  9:24 AM Patient started having more nausea and vomiting.  QTc was prolonged over 600 so we will avoid QT prolonging agents.  Given his altered mental status earlier and soft pressures and fatigue will try to avoid Ativan.  Spoke with pharmacy who recommended Tigan IM which we will order.  We will see if this helps his nausea for him as we wait for his work-up to return for admission.  10:43 AM Patient's labs continue to return.  Troponin is elevated over 600.  Patient's last troponin was 100 so unclear if this is new from a cardiac standpoint or demand ischemia in the setting of sepsis.  Blood pressure is now around 099 systolic and he is still getting his fluids.  We will hold on pressors until he gets more hypotensive.  LFTs have returned extremely elevated.  This appears  to be new.  Lactic acid also elevated at 6.8.  He has AKI and cannot get a PE study so we will switch to getting a noncontrasted CT of the chest/abdomen/pelvis to look for abscess or other liver abnormality.  He may need other imaging to rule out acute pulmonary embolism once he is admitted but his GFR is 19 today and cannot get it.  His creatinine is up to 3.06 from 1.7 yesterday.  COVID and flu are negative however.  11:39 AM Spoke to critical care who is going to come see and admit patient.  I also spoke to Dr. Marlou Porch with cardiology who does not feel this is a primary cardiac problem and agrees that the troponin is likely related to demand ischemia in the setting of hypotension and sepsis.  Critical care will admit for sepsis with multisystem organ failure.         Final Clinical Impression(s) / ED Diagnoses Final diagnoses:  Sepsis with acute renal failure and septic shock, due to unspecified  organism, unspecified acute renal failure type ([place])    Clinical Impression: 1. Sepsis with acute renal failure and septic shock, due to unspecified organism, unspecified acute renal failure type [facility])     Disposition: Admit  This note was prepared with assistance of Dragon voice recognition software. Occasional wrong-word or sound-a-like substitutions may have occurred due to the inherent limitations of voice recognition software.      Talynn Lebon, Gwenyth Allegra, MD 03/16/22 [phone]

## 2022-03-16 NOTE — ED Notes (Signed)
Got patient into a gown on the monitor did ekg shown to Dr Sherry Ruffing patient is resting with call bell in reach

## 2022-03-16 NOTE — Sepsis Progress Note (Signed)
Elink following code sepsis

## 2022-03-16 NOTE — Progress Notes (Addendum)
Pharmacy Antibiotic Note  Joshua [name] is a 86 y.o. male for which pharmacy has been consulted for zosyn and vancomycin dosing for sepsis.  Patient with a history of PE on eliquis, COPD, HF, sleep apnea, CKD, GERD. Patient presenting with AMS, near syncope, and hypoxia.  SCr 1.78>>3.06 WBC 15.6; LA 6.8; T 102 F; HR 108; RR 16 COVID/Flu neg  Plan: Metronidazole per MD Zosyn 3.375g IV q8h (4 hour infusion) -- trend renal function closely for need to adjust dosing Vancomycin 1500 mg once, subsequent dosing as indicated per random vancomycin level until renal function stable and/or improved, at which time scheduled dosing can be considered Trend WBC, Fever, Renal function, & Clinical course F/u cultures, clinical course, WBC, fever De-escalate when able     Temp (24hrs), Avg:102 F (38.9 C), Min:102 F (38.9 C), Max:102 F (38.9 C)  Recent Labs  Lab 03/15/22 0952 03/16/22 0914  WBC  --  15.6*  CREATININE 1.78*  --     Estimated Creatinine Clearance: 33.9 mL/min (A) (by C-G formula based on SCr of 1.78 mg/dL (H)).    Allergies  Allergen Reactions   Other Swelling    Beer- Swelling    Sunflower Oil Swelling   Sulfa Antibiotics Rash    Antimicrobials this admission: Given cefepime/flagyl in ED x 1 7/29 vancomycin 7/29 >> (8/5) Zosyn 7/29 >>  Microbiology results: Pending  Thank you for allowing pharmacy to be a part of this patient's care.  Lorelei Pont, PharmD, BCPS 03/16/2022 10:06 AM ED Clinical Pharmacist -  [phone]

## 2022-03-16 NOTE — Progress Notes (Signed)
Lower extremity venous right study completed.  Preliminary results relayed to [place], MD.  See CV Proc for preliminary results report.   Darlin Coco, RDMS, RVT

## 2022-03-17 ENCOUNTER — Inpatient Hospital Stay: Payer: Self-pay

## 2022-03-17 DIAGNOSIS — I2699 Other pulmonary embolism without acute cor pulmonale: Secondary | ICD-10-CM | POA: Diagnosis present

## 2022-03-17 DIAGNOSIS — A419 Sepsis, unspecified organism: Secondary | ICD-10-CM | POA: Diagnosis not present

## 2022-03-17 DIAGNOSIS — R6520 Severe sepsis without septic shock: Secondary | ICD-10-CM | POA: Diagnosis not present

## 2022-03-17 DIAGNOSIS — I249 Acute ischemic heart disease, unspecified: Secondary | ICD-10-CM | POA: Diagnosis not present

## 2022-03-17 DIAGNOSIS — I2609 Other pulmonary embolism with acute cor pulmonale: Secondary | ICD-10-CM

## 2022-03-17 HISTORY — DX: Other pulmonary embolism without acute cor pulmonale: I26.99

## 2022-03-17 LAB — URINE CULTURE: Culture: NO GROWTH

## 2022-03-17 LAB — COOXEMETRY PANEL
Carboxyhemoglobin: 1.7 % — ABNORMAL HIGH (ref 0.5–1.5)
Methemoglobin: <0.7 % (ref 0.0–1.5)
O2 Saturation: 60.5 %
Total hemoglobin: 10.7 g/dL — ABNORMAL LOW (ref 12.0–16.0)

## 2022-03-17 LAB — STREP PNEUMONIAE URINARY ANTIGEN: Strep Pneumo Urinary Antigen: NEGATIVE

## 2022-03-17 LAB — CBC
HCT: 31.3 % — ABNORMAL LOW (ref 39.0–52.0)
Hemoglobin: 10.3 g/dL — ABNORMAL LOW (ref 13.0–17.0)
MCH: 28.5 pg (ref 26.0–34.0)
MCHC: 32.9 g/dL (ref 30.0–36.0)
MCV: 86.5 fL (ref 80.0–100.0)
Platelets: 139 10*3/uL — ABNORMAL LOW (ref 150–400)
RBC: 3.62 MIL/uL — ABNORMAL LOW (ref 4.22–5.81)
RDW: 14.2 % (ref 11.5–15.5)
WBC: 10.1 10*3/uL (ref 4.0–10.5)
nRBC: 0 % (ref 0.0–0.2)

## 2022-03-17 LAB — PROCALCITONIN: Procalcitonin: 12.59 ng/mL

## 2022-03-17 LAB — COMPREHENSIVE METABOLIC PANEL
ALT: 1022 U/L — ABNORMAL HIGH (ref 0–44)
AST: 1042 U/L — ABNORMAL HIGH (ref 15–41)
Albumin: 2.9 g/dL — ABNORMAL LOW (ref 3.5–5.0)
Alkaline Phosphatase: 92 U/L (ref 38–126)
Anion gap: 5 (ref 5–15)
BUN: 42 mg/dL — ABNORMAL HIGH (ref 8–23)
CO2: 29 mmol/L (ref 22–32)
Calcium: 9.1 mg/dL (ref 8.9–10.3)
Chloride: 103 mmol/L (ref 98–111)
Creatinine, Ser: 2.22 mg/dL — ABNORMAL HIGH (ref 0.61–1.24)
GFR, Estimated: 29 mL/min — ABNORMAL LOW (ref >60)
Glucose, Bld: 75 mg/dL (ref 70–99)
Potassium: 4.3 mmol/L (ref 3.5–5.1)
Sodium: 137 mmol/L (ref 135–145)
Total Bilirubin: 1.2 mg/dL (ref 0.3–1.2)
Total Protein: 6.1 g/dL — ABNORMAL LOW (ref 6.5–8.1)

## 2022-03-17 LAB — TROPONIN I (HIGH SENSITIVITY)
Troponin I (High Sensitivity): 6777 ng/L (ref <18)
Troponin I (High Sensitivity): 5875 ng/L (ref <18)

## 2022-03-17 LAB — GLUCOSE, CAPILLARY
Glucose-Capillary: 96 mg/dL (ref 70–99)
Glucose-Capillary: 79 mg/dL (ref 70–99)
Glucose-Capillary: 81 mg/dL (ref 70–99)
Glucose-Capillary: 93 mg/dL (ref 70–99)
Glucose-Capillary: 98 mg/dL (ref 70–99)

## 2022-03-17 LAB — HEPARIN LEVEL (UNFRACTIONATED): Heparin Unfractionated: >1.1 IU/mL — ABNORMAL HIGH (ref 0.30–0.70)

## 2022-03-17 LAB — APTT: aPTT: 83 seconds — ABNORMAL HIGH (ref 24–36)

## 2022-03-17 LAB — PHOSPHORUS: Phosphorus: 2.6 mg/dL (ref 2.5–4.6)

## 2022-03-17 LAB — MAGNESIUM: Magnesium: 2 mg/dL (ref 1.7–2.4)

## 2022-03-17 MED ORDER — MIDODRINE HCL 5 MG PO TABS
15.0000 mg | ORAL_TABLET | Freq: Three times a day (TID) | ORAL | Status: DC
Start: 2022-03-17 — End: 2022-03-21
  Administered 2022-03-17 – 2022-03-20 (×11): 15 mg via ORAL
  Filled 2022-03-17 (×12): qty 3

## 2022-03-17 MED ORDER — SODIUM CHLORIDE 0.9% FLUSH
10.0000 mL | Freq: Two times a day (BID) | INTRAVENOUS | Status: DC
  Administered 2022-03-17: 10 mL
  Administered 2022-03-17: 20 mL
  Administered 2022-03-18 – 2022-03-20 (×6): 10 mL

## 2022-03-17 MED ORDER — ATORVASTATIN CALCIUM 80 MG PO TABS
80.0000 mg | ORAL_TABLET | Freq: Every day | ORAL | Status: DC
  Administered 2022-03-17 – 2022-03-21 (×5): 80 mg via ORAL
  Filled 2022-03-17 (×5): qty 1

## 2022-03-17 MED ORDER — ASPIRIN 81 MG PO CHEW
81.0000 mg | CHEWABLE_TABLET | Freq: Every day | ORAL | Status: DC
  Administered 2022-03-17 – 2022-03-20 (×4): 81 mg via ORAL
  Filled 2022-03-17 (×4): qty 1

## 2022-03-17 MED ORDER — SODIUM CHLORIDE 0.9% FLUSH: 10.0000 mL | INTRAVENOUS | Status: DC | PRN

## 2022-03-17 NOTE — Progress Notes (Signed)
ANTICOAGULATION CONSULT NOTE - Follow Up Consult  Pharmacy Consult for Heparin Indication: pulmonary embolus  Allergies  Allergen Reactions   Other Swelling    Beer- Swelling    Sunflower Oil Swelling   Sulfa Antibiotics Rash    Patient Measurements: Weight: 82.3 kg (181 lb 7 oz) Heparin Dosing Weight: 81.3 kg  Vital Signs: Temp: 97.6 F (36.4 C) (07/30 0731) Temp Source: Oral (07/30 0731) BP: 103/55 (07/30 1000) Pulse Rate: 75 (07/30 1000)  Labs: Recent Labs    03/15/22 0952 03/16/22 0914 03/16/22 0914 03/16/22 1140 03/16/22 1746 03/16/22 2133 03/17/22 0710  HGB  --  11.2*  --   --   --   --  10.3*  HCT  --  35.4*  --   --   --   --  31.3*  PLT  --  156  --   --   --   --  139*  APTT  --  36  --   --   --  98* 83*  LABPROT  --  24.5*  --   --   --   --   --   INR  --  2.2*  --   --   --   --   --   HEPARINUNFRC  --   --   --   --  >1.10* >1.10* >1.10*  CREATININE 1.78* 3.06*  --   --   --   --   --   TROPONINIHS  --  633*   < > 3,171* 6,913* 8,808*  --    < > = values in this interval not displayed.    Estimated Creatinine Clearance: 19.7 mL/min (A) (by C-G formula based on SCr of 3.06 mg/dL (H)).   Medical History: Past Medical History:  Diagnosis Date   Arthritis    BPH (benign prostatic hyperplasia)    Chronic hiccups    COPD (chronic obstructive pulmonary disease) (HCC)    Folliculitis    posterior scalp per office visit note of Dr Maudie Mercury 07/20/2014     GERD (gastroesophageal reflux disease)    Gout    Hypertension    Hypotension    PE (pulmonary thromboembolism) ([place])    Phlebitis    right arm  at least 20 years ago    Pneumonia    hx of pneumonia as a child    Pulmonary hypertension ([place])    Sleep apnea     Medications:  Medications Prior to Admission  Medication Sig Dispense Refill Last Dose   albuterol (VENTOLIN HFA) 108 (90 Base) MCG/ACT inhaler Inhale 1 puff into the lungs every 4 (four) hours as needed for shortness of breath.    unknown   allopurinol (ZYLOPRIM) 100 MG tablet Take 100 mg by mouth every morning.   03/15/2022   Azelastine HCl 0.15 % SOLN Place 2 sprays into the nose daily as needed (allergies).   unknown   Cinnamon 500 MG capsule Take 500 mg by mouth every morning.   03/15/2022   doxycycline (VIBRAMYCIN) 100 MG capsule Take 100 mg by mouth daily as needed (infection).   unknown   ELIQUIS 5 MG TABS tablet TAKE 1 TABLET BY MOUTH TWICE A DAY 60 tablet 11 03/15/2022 at 5:30pm   finasteride (PROSCAR) 5 MG tablet Take 5 mg by mouth at bedtime.   Past Week   gabapentin (NEURONTIN) 100 MG capsule Take 300 mg by mouth 3 (three) times daily.   03/15/2022   magnesium oxide (MAG-OX) 400  MG tablet Take 1 tablet (400 mg total) by mouth daily. 30 tablet 3 03/15/2022   metoCLOPramide (REGLAN) 5 MG tablet Take 5 mg by mouth 3 (three) times daily before meals.   03/15/2022   midodrine (PROAMATINE) 10 MG tablet Take 1.5 tablets (15 mg total) by mouth 3 (three) times daily with meals. 90 tablet 3 03/15/2022   Multiple Vitamin (MULTIVITAMIN WITH MINERALS) TABS tablet Take 1 tablet by mouth daily. 30 tablet 0 03/15/2022   ondansetron (ZOFRAN) 4 MG tablet Take 1 tablet (4 mg total) by mouth every 6 (six) hours as needed for nausea. 20 tablet 0 unknown   OXYGEN Inhale 2 L into the lungs continuous.   continuous   pantoprazole (PROTONIX) 40 MG tablet Take 40 mg by mouth every morning.   03/15/2022   potassium chloride (KLOR-CON) 10 MEQ tablet TAKE 2 TABLETS BY MOUTH 2 TIMES DAILY. (Patient taking differently: Take 20 mEq by mouth 2 (two) times daily.) 30 tablet 6 03/15/2022   Riociguat 2 MG TABS Take 2 mg by mouth in the morning, at noon, and at bedtime. 90 tablet 11 03/15/2022 at 5:30pm   STIOLTO RESPIMAT 2.5-2.5 MCG/ACT AERS INHALE 2 PUFFS BY MOUTH INTO THE LUNGS DAILY (Patient taking differently: Inhale 2 each into the lungs daily.) 4 g 5 03/15/2022   torsemide (DEMADEX) 20 MG tablet Take 1.5 tablets (30 mg total) by mouth daily. (Patient  taking differently: Take 40 mg by mouth daily.) 180 tablet 2 03/15/2022   dapagliflozin propanediol (FARXIGA) 10 MG TABS tablet Take 1 tablet (10 mg total) by mouth daily before breakfast. 90 tablet 3 new rx    Scheduled:   arformoterol  15 mcg Nebulization BID   aspirin  81 mg Oral Daily   atorvastatin  80 mg Oral Daily   bisacodyl  10 mg Rectal QHS   Chlorhexidine Gluconate Cloth  6 each Topical Q0600   insulin aspart  0-6 Units Subcutaneous Q4H   midodrine  15 mg Oral TID WC   pantoprazole (PROTONIX) IV  40 mg Intravenous QHS   revefenacin  175 mcg Nebulization Daily   Infusions:   sodium chloride 10 mL/hr at 03/17/22 0251   doxycycline (VIBRAMYCIN) IV Stopped (03/17/22 0250)   heparin 1,300 Units/hr (03/17/22 1000)   norepinephrine (LEVOPHED) Adult infusion 3 mcg/min (03/17/22 1000)   piperacillin-tazobactam (ZOSYN)  IV 12.5 mL/hr at 03/17/22 1000   promethazine (PHENERGAN) injection (IM or IVPB)     PRN: docusate sodium, polyethylene glycol, promethazine (PHENERGAN) injection (IM or IVPB)  Assessment: 51 yom with a history of PE on eliquis, COPD, HF, sleep apnea, CKD, GERD. Patient is presenting with sudden onset N/V and hiccups which progressed to SOB, fevers, elevated lactates, and AKI/ALF. Heparin per pharmacy consult placed for pulmonary embolus. Patient is on apixaban prior to arrival. Last dose 7/28pm. Will require aPTT monitoring due to likely falsely high anti-Xa level secondary to DOAC use.  aPTT is 83 - therapeutic for goal on Heparin at 1300 units/hr.  Heparin level still falsely high as expected due to recent apixaban.  Level drawn appropriately. No bleeding noted per RN.  LE Dopplers and VQ scan pending.   Goal of Therapy:  Heparin level 0.3-0.7 units/ml aPTT 66-102 seconds Monitor platelets by anticoagulation protocol: Yes   Plan:  Continue heparin to 1300 units/hr  Daily heparin level and aPTT  Continue to monitor via aPTT until levels are  correlated Continue to monitor H&H and platelets  Sloan Leiter, PharmD, BCPS, BCCCP Clinical Pharmacist  Please refer to [facility] for [place] numbers 03/17/2022 10:27 AM

## 2022-03-17 NOTE — Progress Notes (Signed)
Peripherally Inserted Central Catheter Placement  The IV Nurse has discussed with the patient and/or persons authorized to consent for the patient, the purpose of this procedure and the potential benefits and risks involved with this procedure.  The benefits include less needle sticks, lab draws from the catheter, and the patient may be discharged home with the catheter. Risks include, but not limited to, infection, bleeding, blood clot (thrombus formation), and puncture of an artery; nerve damage and irregular heartbeat and possibility to perform a PICC exchange if needed/ordered by physician.  Alternatives to this procedure were also discussed.  Bard Power PICC patient education guide, fact sheet on infection prevention and patient information card has been provided to patient /or left at bedside.    PICC Placement Documentation  PICC Double Lumen 03/17/22 Right Brachial 41 cm 0 cm (Active)  Indication for Insertion or Continuance of Line Chronic illness with exacerbations (CF, Sickle Cell, etc.) 03/17/22 1400  Exposed Catheter (cm) 0 cm 03/17/22 1400  Site Assessment Clean, Dry, Intact 03/17/22 1400  Lumen #1 Status Flushed;Saline locked;Blood return noted 03/17/22 1400  Lumen #2 Status Flushed;Saline locked;Blood return noted 03/17/22 1400  Dressing Type Transparent;Securing device 03/17/22 1400  Dressing Status Antimicrobial disc in place 03/17/22 1400  Safety Lock Not Applicable 02/77/41 2878  Line Care Connections checked and tightened 03/17/22 1400  Line Adjustment (NICU/IV Team Only) No 03/17/22 1400  Dressing Intervention New dressing 03/17/22 1400  Dressing Change Due 03/24/22 03/17/22 1400       George 03/17/2022, 2:24 PM

## 2022-03-17 NOTE — H&P (Signed)
 NAME:  Joshua [name], MRN:  [id number], DOB:  01/26/1936, LOS: 1 ADMISSION DATE:  03/16/2022, CONSULTATION DATE:  03/16/22 REFERRING MD:  EDP, CHIEF COMPLAINT:  N/V   History of Present Illness:  86 year old man with myriad of chronic medical illnesses detailed below presenting with sudden onset N/V and hiccups yesterday.  No sick contacts.  Progressed to SOB, fevers.  Spouse at bedside corroborating history.  ROS as below.   Labs show lactic acidmia, acute renal/liver failure.  Patient started on broad spectrum abx, CT C/A/P possible colitis + constipation + aspiration vs. CAP.  PCCM asked to admit.  Pertinent  Medical History  Big picture: multifactorial RH failure, bad COPD, CKD with recent FTT, weight loss  Workup in Sept 2022 for similar presentation showed nutcracker SMA-Aorta duodenal narrowing  Pulmonary HTN, diastolic CHF, h/o PE (2016), hypotension requiring midodrine, HTN, aortic atherosclerosis, OSA, h/o hiatial hernia, chronic respiratory failure with hypoixa, supplemental O2, COPD, h/p CAP, GErD, arthritis, CKD, BPH, hearing loss, gout  Significant Hospital Events: Including procedures, antibiotic start and stop dates in addition to other pertinent events   7/29 admit  Interim History / Subjective:   No acute events overnight Lactic acid has cleared He remains on low dose levophed He feels much better today  Objective   Blood pressure (!) 102/53, pulse 74, temperature 97.6 F (36.4 C), temperature source Oral, resp. rate 11, weight 82.3 kg, SpO2 97 %.        Intake/Output Summary (Last 24 hours) at 03/17/2022 0746 Last data filed at 03/17/2022 0600 Gross per 24 hour  Intake 4484.93 ml  Output 1930 ml  Net 2554.93 ml   Filed Weights   03/16/22 1400  Weight: 82.3 kg    Examination: General: elderly male, no acute distress HENT: moist mucous membranes, sclera anicteric Lungs: Crackles R>L base, no accessory muscle use Cardiovascular: RRR, ext feel  warm Abdomen: soft, +BS, nontender Extremities: trace edema Neuro: Moves all 4 ext to command, A&O x 3 Skin: no rashes  Resolved Hospital Problem list   N/A  Assessment & Plan:  Septic Shock with borderline shock, lactic acidemia- preceding GI symptoms, findings on CT would point more toward a GI bug and subsequent aspiration.  Does have nutcracker duodenal obstruction but stomach looks fairly decompressed on CT.  COPD on HOT not in flare  FTT, weight loss, severe protein calorie malnutrition: has been seen by palliative as OP, last   Groups 2/4 pulm HTN on riociguat/AC PTA  RBBB, long QT  Acute on chronic (CKD3a) renal failure likely pre-renal + septic ATN, no obstruction on CT  Acute liver injury- RUQ with no acute abnormality   - zosyn/doxy, f/u culture data. DC vanc, MRSA screen negative - Wean levophed for MAP of 65 - Resume home midodrine today - Clears for diet, progress as tolerated - Eliquis on hold, use heparin instead for chronic PE - Troponin with significant elevation, cardiology following. Follow up Echo - Avoid nephrotoxins - Riociguat on hold - Brovana/yupelri - If does not turn around with supportive care, re-engagement with palliative care is probably in order  Best Practice (right click and "Reselect all SmartList Selections" daily)   Diet/type: clear liquids DVT prophylaxis: systemic heparin GI prophylaxis: PPI Lines: N/A Foley:  N/A Code Status:  full code Last date of multidisciplinary goals of care discussion [Pending]  Labs   CBC: Recent Labs  Lab 03/16/22 0914  WBC 15.6*  NEUTROABS 14.3*  HGB 11.2*  HCT 35.4*    MCV 89.8  PLT 156    Basic Metabolic Panel: Recent Labs  Lab 03/15/22 0952 03/16/22 0914  NA 136 135  K 4.6 5.4*  CL 97* 97*  CO2 32 22  GLUCOSE 117* 119*  BUN 26* 37*  CREATININE 1.78* 3.06*  CALCIUM 10.6* 9.6   GFR: Estimated Creatinine Clearance: 19.7 mL/min (A) (by C-G formula based on SCr of 3.06 mg/dL  (H)). Recent Labs  Lab 03/16/22 0914 03/16/22 1140 03/16/22 1746 03/16/22 2133  WBC 15.6*  --   --   --   LATICACIDVEN 6.8* 5.5* 1.7 1.5    Liver Function Tests: Recent Labs  Lab 03/16/22 0914  AST 359*  ALT 272*  ALKPHOS 99  BILITOT 1.7*  PROT 6.9  ALBUMIN 3.4*   Recent Labs  Lab 03/16/22 1140  LIPASE 31   No results for input(s): "AMMONIA" in the last 168 hours.  ABG    Component Value Date/Time   PHART 7.391 04/24/2021 0220   PCO2ART 60.5 (H) 04/24/2021 0220   PO2ART 38.5 (LL) 04/24/2021 0220   HCO3 33.4 (H) 10/04/2021 1008   TCO2 34 (H) 10/04/2021 1008   O2SAT 83 10/04/2021 1008     Coagulation Profile: Recent Labs  Lab 03/16/22 0914  INR 2.2*    Cardiac Enzymes: No results for input(s): "CKTOTAL", "CKMB", "CKMBINDEX", "TROPONINI" in the last 168 hours.  HbA1C: Hgb A1c MFr Bld  Date/Time Value Ref Range Status  03/16/2022 05:46 PM 5.0 4.8 - 5.6 % Final    Comment:    (NOTE) Pre diabetes:          5.7%-6.4%  Diabetes:              >6.4%  Glycemic control for   <7.0% adults with diabetes     CBG: Recent Labs  Lab 03/16/22 1925 03/16/22 2337 03/17/22 0337 03/17/22 0730  GLUCAP 108* 105* 96 79    Critical care time: 40 minutes     , MD [facility] Office: [phone]   See Amion for personal pager PCCM on call pager [phone] until 7pm. Please call Elink 7p-7a. [phone]    

## 2022-03-17 NOTE — Consult Note (Signed)
Advanced Heart Failure Team Consult Note   Primary Physician: [name], DO PCP-Cardiologist:  Sherren Mocha, MD  Reason for Consultation: Shock, elevated troponin  HPI:    Joshua [name] is seen today for evaluation of shock, elevated troponin at the request of Dr. Tamala Julian.   86 y.o. with history of HFPEF, prior PE, and pulmonary hypertension.  Patient had a PE in 2016.  He has been on apixaban. He has a long history of diastolic CHF.  Most recent echo in 2/22 showed a significant component of RV failure with EF 55-60%, IV septum flattened, severe RV enlargement, severely decreased RV function, PASP 57 mmHg.  CTA chest in 4/22 showed no acute PE and mild emphysema, but V/Q scan in 5/22 was suggestive of chronic PE in the right middle lobe.  RHC in 4/22 showed normal filling pressures with moderate PAH. He additionally has chronic hiccups followed by Dr. Benson Norway, now on gabapentin. PFTs in 6/22 showed mixed picture with severe obstruction, moderate restriction, and severely decreased DLCO.    Admitted 8/25-8/30/22 with A/C HF exacerbation. He was aggressively diuresed with lasix/metolazone, had unna boots, farxiga added.  Midodrine was increased for hypotension. Hospitalization c/b AKI on CKD III.   Readmitted 9/1-9/12/22 with shock (mixed septic and cardiogenic), likely 2/2 to aspiration pneumonia and A/C CHF.  Suspect possible gut translocation with ileus/partial SBO; CT abdomen with "nutcracker phenomenon".  He was started on NE + antibiotics. General surgery consulted and felt not to be a surgical candidate. Diuretics initially held due to over-diuresis and shock. Eventually able to wean pressors off, restart PO torsemide and midodrine. Hospitalization c/b transaminitis, delirium, and BPH with urinary retention, requiring foley catheter. Palliative care was consulted for [place] and patient DNR/DNI. PT/OT recommended HH. He was discharged home, weight 220 lbs.   EGD 11/22 showed esophagitis,  hiatal hernia and yeast.    Acute visit 07/17/21 for weakness/falls. He had a syncopal episode in clinic. Labs showed hypokalemia of 3.3, SCr elevated at 2.13, ReDs 27%. [place] stopped, torsemide decreased to 40 mg daily and compression hose and abdominal binder ordered. 3-day Zio placed to evaluate for possible arrhythmogenic cause of symptoms.  Zio monitor showed 19 second run of VT.    Admitted 2/23 with PNA and a/c CHF. Seen by palliative care and remained full code.  I saw him in clininc on 7/29, he reported stable dyspnea and was mildly volume overloaded on exam.  Rare atypical chest pain. Torsemide was increased to 30 mg daily. BP was stable and creatinine was stable at 1.78.  Later that evening, patient developed significant nausea/vomiting along with worsening hiccups.  He then developed progressive dyspnea and had a fever.  He came to the ER.  CT chest/abd/pelvis showed RLL infiltrate thought to be aspiration PNA and he had possible colitis. He was started on broad spectrum abx with vancomycin/Zosyn/doxycycline.  He was hypotensive and thought to have septic shock with lactate up to 6.8.  Norepinephrine was started.  He denies chest pain, but HS-TnI rose as high at 8818.  ECG with unchanged RBBB. Creatinine up to 3.06.   This morning, he feels better.  No N/V or chest pain.  He is on NE at 4 peripherally.  Lactate down to 1.7.    Review of Systems: All systems reviewed and negative except as per HPI.   Home Medications Prior to Admission medications   Medication Sig Start Date End Date Taking? Authorizing Provider  albuterol (VENTOLIN HFA) 108 (90 Base)  MCG/ACT inhaler Inhale 1 puff into the lungs every 4 (four) hours as needed for shortness of breath.   Yes [provider]  allopurinol (ZYLOPRIM) 100 MG tablet Take 100 mg by mouth every morning.   Yes [provider]  Azelastine HCl 0.15 % SOLN Place 2 sprays into the nose daily as needed (allergies). 02/22/21  Yes  [provider]  Cinnamon 500 MG capsule Take 500 mg by mouth every morning.   Yes [provider]  doxycycline (VIBRAMYCIN) 100 MG capsule Take 100 mg by mouth daily as needed (infection).   Yes [provider]  ELIQUIS 5 MG TABS tablet TAKE 1 TABLET BY MOUTH TWICE A DAY 02/18/22  Yes Larey Dresser, MD  finasteride (PROSCAR) 5 MG tablet Take 5 mg by mouth at bedtime.   Yes [provider]  gabapentin (NEURONTIN) 100 MG capsule Take 300 mg by mouth 3 (three) times daily. 11/27/20  Yes [provider]  magnesium oxide (MAG-OX) 400 MG tablet Take 1 tablet (400 mg total) by mouth daily. 02/18/22  Yes Larey Dresser, MD  metoCLOPramide (REGLAN) 5 MG tablet Take 5 mg by mouth 3 (three) times daily before meals.   Yes [provider]  midodrine (PROAMATINE) 10 MG tablet Take 1.5 tablets (15 mg total) by mouth 3 (three) times daily with meals. 10/17/21  Yes Larey Dresser, MD  Multiple Vitamin (MULTIVITAMIN WITH MINERALS) TABS tablet Take 1 tablet by mouth daily. 05/01/21  Yes Sheikh, Omair Latif, DO  ondansetron (ZOFRAN) 4 MG tablet Take 1 tablet (4 mg total) by mouth every 6 (six) hours as needed for nausea. 04/30/21  Yes Sheikh, Omair Latif, DO  OXYGEN Inhale 2 L into the lungs continuous.   Yes [provider]  pantoprazole (PROTONIX) 40 MG tablet Take 40 mg by mouth every morning.   Yes [provider]  potassium chloride (KLOR-CON) 10 MEQ tablet TAKE 2 TABLETS BY MOUTH 2 TIMES DAILY. Patient taking differently: Take 20 mEq by mouth 2 (two) times daily. 09/13/21  Yes Larey Dresser, MD  Riociguat 2 MG TABS Take 2 mg by mouth in the morning, at noon, and at bedtime. 10/08/21  Yes Larey Dresser, MD  STIOLTO RESPIMAT 2.5-2.5 MCG/ACT AERS INHALE 2 PUFFS BY MOUTH INTO THE LUNGS DAILY Patient taking differently: Inhale 2 each into the lungs daily. 11/26/21  Yes [name], MD  torsemide (DEMADEX) 20 MG tablet Take 1.5 tablets (30  mg total) by mouth daily. Patient taking differently: Take 40 mg by mouth daily. 03/15/22  Yes Larey Dresser, MD  dapagliflozin propanediol (FARXIGA) 10 MG TABS tablet Take 1 tablet (10 mg total) by mouth daily before breakfast. 03/15/22   Larey Dresser, MD    Past Medical History: Past Medical History:  Diagnosis Date   Arthritis    BPH (benign prostatic hyperplasia)    Chronic hiccups    COPD (chronic obstructive pulmonary disease) ([place])    Folliculitis    posterior scalp per office visit note of Dr Maudie Mercury 07/20/2014     GERD (gastroesophageal reflux disease)    Gout    Hypertension    Hypotension    PE (pulmonary thromboembolism) ([place])    Phlebitis    right arm  at least 20 years ago    Pneumonia    hx of pneumonia as a child    Pulmonary hypertension ([place])    Sleep apnea     Past Surgical History: Past Surgical  History:  Procedure Laterality Date   BIOPSY  11/03/2020   Procedure: BIOPSY;  Surgeon: Carol Ada, MD;  Location: [facility];  Service: Endoscopy;;   BIOPSY  07/06/2021   Procedure: BIOPSY;  Surgeon: Carol Ada, MD;  Location: [facility];  Service: Endoscopy;;   bone removed from little toe right foot      CHOLECYSTECTOMY     ESOPHAGOGASTRODUODENOSCOPY (EGD) WITH PROPOFOL N/A 11/03/2020   Procedure: ESOPHAGOGASTRODUODENOSCOPY (EGD) WITH PROPOFOL;  Surgeon: Carol Ada, MD;  Location: [facility];  Service: Endoscopy;  Laterality: N/A;   ESOPHAGOGASTRODUODENOSCOPY (EGD) WITH PROPOFOL N/A 07/06/2021   Procedure: ESOPHAGOGASTRODUODENOSCOPY (EGD) WITH PROPOFOL;  Surgeon: Carol Ada, MD;  Location: [facility];  Service: Endoscopy;  Laterality: N/A;   pilonidal cyst removal      RIGHT HEART CATH N/A 12/13/2020   Procedure: RIGHT HEART CATH;  Surgeon: Larey Dresser, MD;  Location: [place] [facility];  Service: Cardiovascular;  Laterality: N/A;   TOTAL KNEE ARTHROPLASTY Left 09/13/2014   Procedure: LEFT TOTAL KNEE ARTHROPLASTY;  Surgeon: Mauri Pole, MD;  Location: [facility];  Service: Orthopedics;  Laterality: Left;    Family History: Family History  Problem Relation Age of Onset   Heart attack Brother 49    Social History: Social History   Socioeconomic History   Marital status: Married    Spouse name: Joann   Number of children: 2   Years of education: 14   Highest education level: Not on file  Occupational History   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 Stopped working in  2022  Tobacco Use   Smoking status: Former    Packs/day: 1.00    Years: 50.00    Total pack years: 50.00    Types: Cigarettes    Start date: 73    Quit date: 08/19/2006    Years since quitting: 15.5   Smokeless tobacco: Never   Tobacco comments:    Former smoke 03/15/22  Vaping Use   Vaping Use: Never used  Substance and Sexual Activity   Alcohol use: Not Currently    Alcohol/week: 6.0 standard drinks of alcohol    Types: 6 Shots of liquor per week   Drug use: No   Sexual activity: Not on file  Other Topics Concern   Not on file  Social History Narrative   Not on file   Social Determinants of Health   Financial Resource Strain: Not on file  Food Insecurity: No Food Insecurity (05/09/2021)   Hunger Vital Sign    Worried About Running Out of Food in the Last Year: Never true    Ran Out of Food in the Last Year: Never true  Transportation Needs: No Transportation Needs (05/09/2021)   PRAPARE - Hydrologist (Medical): No    Lack of Transportation (Non-Medical): No  Physical Activity: Not on file  Stress: No Stress Concern Present (05/09/2021)   [place]    Feeling of Stress : Only a little  Social Connections: Not on file    Allergies:  Allergies  Allergen Reactions   Other Swelling    Beer- Swelling    Sunflower Oil Swelling   Sulfa Antibiotics Rash    Objective:    Vital  Signs:   Temp:  [97.6 F (36.4 C)-101.5 F (38.6 C)] 97.6 F (36.4 C) (07/30 0731) Pulse Rate:  [70-110] 76 (07/30 0945) Resp:  [10-28] 13 (07/30 0945)  BP: (74-117)/(45-93) 109/62 (07/30 0945) SpO2:  [87 %-100 %] 97 % (07/30 0945) Weight:  [82.3 kg] 82.3 kg (07/29 1400) Last BM Date : 03/14/22  Weight change: Filed Weights   03/16/22 1400  Weight: 82.3 kg    Intake/Output:   Intake/Output Summary (Last 24 hours) at 03/17/2022 0958 Last data filed at 03/17/2022 0700 Gross per 24 hour  Intake 3422.78 ml  Output 1930 ml  Net 1492.78 ml      Physical Exam    General:  Well appearing. No resp difficulty HEENT: normal Neck: supple. JVP 8-9 cm. Carotids 2+ bilat; no bruits. No lymphadenopathy or thyromegaly appreciated. Cor: PMI nondisplaced. Regular rate & rhythm with widely split S2. No rubs, gallops or murmurs. Lungs: clear Abdomen: soft, mild tenderness LLQ, nondistended. No hepatosplenomegaly. No bruits or masses. Good bowel sounds. Extremities: no cyanosis, clubbing, rash, edema Neuro: alert & orientedx3, cranial nerves grossly intact. moves all 4 extremities w/o difficulty. Affect pleasant   Telemetry   NSR 80s (personally reviewed)  EKG    NSR, RBBB, no change from prior.  Personally reviewed.   Labs   Basic Metabolic Panel: Recent Labs  Lab 03/15/22 0952 03/16/22 0914  NA 136 135  K 4.6 5.4*  CL 97* 97*  CO2 32 22  GLUCOSE 117* 119*  BUN 26* 37*  CREATININE 1.78* 3.06*  CALCIUM 10.6* 9.6    Liver Function Tests: Recent Labs  Lab 03/16/22 0914  AST 359*  ALT 272*  ALKPHOS 99  BILITOT 1.7*  PROT 6.9  ALBUMIN 3.4*   Recent Labs  Lab 03/16/22 1140  LIPASE 31   No results for input(s): "AMMONIA" in the last 168 hours.  CBC: Recent Labs  Lab 03/16/22 0914 03/17/22 0710  WBC 15.6* 10.1  NEUTROABS 14.3*  --   HGB 11.2* 10.3*  HCT 35.4* 31.3*  MCV 89.8 86.5  PLT 156 139*    Cardiac Enzymes: No results for input(s): "CKTOTAL",  "CKMB", "CKMBINDEX", "TROPONINI" in the last 168 hours.  BNP: BNP (last 3 results) Recent Labs    12/27/21 1001 03/15/22 0952 03/16/22 0914  BNP 83.7 170.1* 380.3*    ProBNP (last 3 results) No results for input(s): "PROBNP" in the last 8760 hours.   CBG: Recent Labs  Lab 03/16/22 1925 03/16/22 2337 03/17/22 0337 03/17/22 0730  GLUCAP 108* 105* 96 79    Coagulation Studies: Recent Labs    03/16/22 0914  LABPROT 24.5*  INR 2.2*     Imaging   [place] EKG SITE RITE  Result Date: 03/17/2022 If Site Rite image not attached, placement could not be confirmed due to current cardiac rhythm.  US Abdomen Limited RUQ (LIVER/GB)  Result Date: 03/16/2022 CLINICAL DATA:  86 year old male with elevated LFTs. History of cholecystectomy. EXAM: ULTRASOUND ABDOMEN LIMITED RIGHT UPPER QUADRANT COMPARISON:  04/19/2021 ultrasound.  Prior CTs. FINDINGS: Gallbladder: Not visualized compatible with history of cholecystectomy. Common bile duct: Diameter: 2.3 mm. There is no evidence of intrahepatic or extrahepatic biliary dilatation. Liver: No suspicious focal lesions identified. Echogenicity is within normal limits. A few tiny cysts are present. Portal vein is patent on color Doppler imaging with normal direction of blood flow towards the liver. Other: A very small amount of perihepatic fluid is again noted. IMPRESSION: 1. No acute abnormality. No significant hepatic abnormalities. No biliary dilatation. 2. Very small amount of perihepatic ascites again noted. 3. Status post cholecystectomy. Electronically Signed   By: Margarette Canada M.D.   On: 03/16/2022 13:18   CT  CHEST ABDOMEN PELVIS WO CONTRAST  Result Date: 03/16/2022 CLINICAL DATA:  Sepsis EXAM: CT CHEST, ABDOMEN AND PELVIS WITHOUT CONTRAST TECHNIQUE: Multidetector CT imaging of the chest, abdomen and pelvis was performed following the standard protocol without IV contrast. RADIATION DOSE REDUCTION: This exam was performed according to the  departmental dose-optimization program which includes automated exposure control, adjustment of the mA and/or kV according to patient size and/or use of iterative reconstruction technique. COMPARISON:  Previous CT of the abdomen and pelvis dated Dec 25, 2021 and CT of the chest dated December 13, 2021 FINDINGS: CT CHEST FINDINGS Cardiovascular: Moderate atheromatous calcifications of the arch of the aorta. Ascending thoracic aorta has a normal appearance. Moderately severe atheromatous calcifications of the coronary arteries. Pulmonary artery measures 3.6 cm likely reflecting pulmonary hypertension. Moderate cardiomegaly. No pericardial effusion. Mediastinum/Nodes: No enlarged mediastinal, hilar, or axillary lymph nodes. Thyroid gland, and trachea demonstrate no significant findings. Small to moderate hiatal hernia. Lungs/Pleura: Severe centrilobular emphysema throughout the lungs and is more prominent at the upper lobes. There is moderate consolidative airspace disease seen at the right lung base with air bronchograms with some pleural superimposed atelectasis and is new. Musculoskeletal: Moderate thoracic spondylosis. CT ABDOMEN PELVIS FINDINGS Hepatobiliary: No focal liver abnormality is seen. Status post cholecystectomy. No biliary dilatation. Pancreas: Unremarkable. No pancreatic ductal dilatation or surrounding inflammatory changes. Spleen: Normal in size without focal abnormality. Adrenals/Urinary Tract: Adrenal glands are unremarkable. Kidneys are normal, without renal calculi, focal lesion, or hydronephrosis except for stable couple of cysts in the left kidney. Bladder is unremarkable. Stomach/Bowel: Bowel-gas pattern is nonobstructive. Appendix has a normal appearance. There is an apparent mesenteric stranding seen at the right lower abdomen-pelvis and is more conspicuous in the present study (images 96 through 100/3). Moderate stool burden in the sigmoid and rectum. Vascular/Lymphatic: Again seen are the  extensive atheromatous calcifications of the abdominal aorta extending into the iliac arteries. Infrarenal abdominal aortic aneurysm measuring 3.6 cm in greatest diameter, stable. No enlarged abdominal or pelvic lymph nodes. Reproductive: Stable markedly enlarged prostate gland measuring 6.4 x 5.7 cm. Other: There is abdominal wall hernia containing small segment of the bowel loop without bowel obstruction. No stranding seen in the hernial sac. Musculoskeletal: No acute or significant osseous findings. IMPRESSION: 1. There is moderate consolidative airspace disease seen at the right lung base and is new of likely pneumonia. There is some superimposed atelectasis and pleural thickening seen at the right lung base. No pleural effusion. 2. Moderate cardiomegaly. Severe atheromatous calcifications of the coronary arteries. 3. Extensive atheromatous calcifications of the abdominal aorta and iliac arteries. Stable small infrarenal abdominal aortic aneurysm. 4. Apparent mild mesenteric stranding at the right lower abdomen and pelvis. Appendix has a normal appearance. No free fluid or abscess in the peritoneal cavity. 5.  Stable severe prostatomegaly. 6.  Moderate stool burden in the sigmoid and rectum Electronically Signed   By: Frazier Richards M.D.   On: 03/16/2022 11:52     Medications:     Current Medications:  arformoterol  15 mcg Nebulization BID   aspirin  81 mg Oral Daily   atorvastatin  80 mg Oral Daily   bisacodyl  10 mg Rectal QHS   Chlorhexidine Gluconate Cloth  6 each Topical Q0600   insulin aspart  0-6 Units Subcutaneous Q4H   midodrine  15 mg Oral TID WC   pantoprazole (PROTONIX) IV  40 mg Intravenous QHS   revefenacin  175 mcg Nebulization Daily    Infusions:  sodium chloride  10 mL/hr at 03/17/22 0251   doxycycline (VIBRAMYCIN) IV Stopped (03/17/22 0250)   heparin 1,300 Units/hr (03/17/22 0939)   norepinephrine (LEVOPHED) Adult infusion 4 mcg/min (03/17/22 0940)   piperacillin-tazobactam  (ZOSYN)  IV 12.5 mL/hr at 03/17/22 0700   promethazine (PHENERGAN) injection (IM or IVPB)       Assessment/Plan   1. Shock: Concern for septic shock with presentation with nausea/vomiting/fever/abdominal pain and possible RLL aspiration PNA + possible colitis.  He is now on NE 4 with lactate down to 1.7.  - Needs central access, would place PICC as he is not an HD candidate.  Follow CVP and co-ox.  - Diet restarted, can restart midodrine 15 mg tid (home dose) and titrate off NE as able.  - Continue broad spectrum abx and will send procalcitonin.  2. Elevated troponin: HS-TnI up to 8818, no chest pain.  However, nausea/vomiting could be an atypical presentation of ischemia. ECG unchanged.  Could be demand ischemia in the setting of septic shock, but TnI is high for this.  Cannot rule out true ACS.  - Not a cath candidate at this time with creatinine up to 3.  - Would continue heparin gtt, add ASA 81 and atorvastatin 80 mg daily.  - Needs echo, ordered.  3. ID: Suspect aspiration PNA from CT chest (RLL infiltrate), also concern for possible colitis based on abdominal CT. Suspect septic shock as above.  - Continue vanc/Zosyn/doxycycline.  - Send procalcitonin.  4. Chronic HFpEF/RV failure: Echo (2/22) with EF 55-60%, IV septum flattened, severe RV enlargement, severely decreased RV function, PASP 57 mmHg.  He has severe RV failure.  Chronically NYHA III. He appears at least mildly volume overloaded on exam but is not short of breath at rest.  - With AKI and shock, holding diuretics for now.  - Will place PICC as above, follow CVP.  Will eventually need to restart diuretics.  - Hold dapagliflozin with AKI.   - Repeat echo as above.  5. Pulmonary HTN: PAH noted on 4/22 RHC with PVR 6.1 WU.  This appears to be multifactorial with OSA, severe emphysema, and a suspected chronic PE involving the right middle lobe (group 3 and group 4 PH). Given the suspected mixed etiology with only 1 area of chronic  thromboembolism (right middle lobe) as well as age, do not think that pulmonary thromboendarterectomy would be indicated.  Rheumatologic serologic workup was negative.  PFTs showed severe obstruction and moderate restriction, suggesting significant COPD.  - Off riociguat while hypotensive, eventually need to restart.  6. AKI on stage III CKD: Creatinine 1.78 => 3.06 in setting of shock.   - Pending BMET today.  - Support MAP.  7. OSA: Moderate OSA on sleep study. He refused CPAP. 8. Emphysema: Prior smoker.  Emphysema on CT and severe obstruction on PFTs. On 2L home oxygen.  9. Chronic PE: Diagnosed by V/Q scan.  No abnormal bleeding. He has a probable chronic RLE DVT on venous doppler this admission.  - Continue Eliquis.  10. Hiatial hernia w/ esophagitis: Has significant GERD. He follows with Dr. Benson Norway. - Continue PPI.  11. Nutcracker SMA: Compresses duodenum.  This does not appear to be causing his current symptoms.   Length of Stay: 1  Loralie Champagne, MD  03/17/2022, 9:58 AM  Advanced Heart Failure Team Pager [phone] (M-F; 7a - 5p)  Please contact [facility] for night-coverage after hours (4p -7a ) and weekends on [email or web address]

## 2022-03-17 NOTE — Progress Notes (Signed)
SLP Cancellation Note  Patient Details Name: Joshua [name] MRN: [id number] DOB: [birth year]   Cancelled treatment:       Reason Eval/Treat Not Completed: Other (comment) (Patient's RN notified SLP that she did swallow screen and patient did fine, diet ordered. SLP will s/o at this time but please reorder if patient exhibiting any s/s of dysphagia.)  Sonia Baller, MA, CCC-SLP Speech Therapy

## 2022-03-18 ENCOUNTER — Telehealth (HOSPITAL_COMMUNITY): Payer: Self-pay | Admitting: Family Medicine

## 2022-03-18 ENCOUNTER — Inpatient Hospital Stay (HOSPITAL_COMMUNITY): Payer: Medicare Other

## 2022-03-18 DIAGNOSIS — R6520 Severe sepsis without septic shock: Secondary | ICD-10-CM | POA: Diagnosis not present

## 2022-03-18 DIAGNOSIS — A419 Sepsis, unspecified organism: Secondary | ICD-10-CM | POA: Diagnosis not present

## 2022-03-18 DIAGNOSIS — I5031 Acute diastolic (congestive) heart failure: Secondary | ICD-10-CM

## 2022-03-18 LAB — COMPREHENSIVE METABOLIC PANEL
ALT: 1084 U/L — ABNORMAL HIGH (ref 0–44)
AST: 893 U/L — ABNORMAL HIGH (ref 15–41)
Albumin: 2.7 g/dL — ABNORMAL LOW (ref 3.5–5.0)
Alkaline Phosphatase: 84 U/L (ref 38–126)
Anion gap: 4 — ABNORMAL LOW (ref 5–15)
BUN: 28 mg/dL — ABNORMAL HIGH (ref 8–23)
CO2: 29 mmol/L (ref 22–32)
Calcium: 9 mg/dL (ref 8.9–10.3)
Chloride: 103 mmol/L (ref 98–111)
Creatinine, Ser: 1.53 mg/dL — ABNORMAL HIGH (ref 0.61–1.24)
GFR, Estimated: 45 mL/min — ABNORMAL LOW (ref >60)
Glucose, Bld: 115 mg/dL — ABNORMAL HIGH (ref 70–99)
Potassium: 3.9 mmol/L (ref 3.5–5.1)
Sodium: 136 mmol/L (ref 135–145)
Total Bilirubin: 0.9 mg/dL (ref 0.3–1.2)
Total Protein: 6.1 g/dL — ABNORMAL LOW (ref 6.5–8.1)

## 2022-03-18 LAB — ECHOCARDIOGRAM COMPLETE
AV Vena cont: 0.2 cm
Area-P 1/2: 2.85 cm2
P 1/2 time: 288 msec
S' Lateral: 2.5 cm
Weight: 2903.02 oz

## 2022-03-18 LAB — APTT
aPTT: 111 seconds — ABNORMAL HIGH (ref 24–36)
aPTT: 102 seconds — ABNORMAL HIGH (ref 24–36)

## 2022-03-18 LAB — MAGNESIUM: Magnesium: 1.8 mg/dL (ref 1.7–2.4)

## 2022-03-18 LAB — CBC
HCT: 33.1 % — ABNORMAL LOW (ref 39.0–52.0)
Hemoglobin: 10.9 g/dL — ABNORMAL LOW (ref 13.0–17.0)
MCH: 28.6 pg (ref 26.0–34.0)
MCHC: 32.9 g/dL (ref 30.0–36.0)
MCV: 86.9 fL (ref 80.0–100.0)
Platelets: 123 10*3/uL — ABNORMAL LOW (ref 150–400)
RBC: 3.81 MIL/uL — ABNORMAL LOW (ref 4.22–5.81)
RDW: 14 % (ref 11.5–15.5)
WBC: 6.6 10*3/uL (ref 4.0–10.5)
nRBC: 0 % (ref 0.0–0.2)

## 2022-03-18 LAB — PHOSPHORUS: Phosphorus: 2.3 mg/dL — ABNORMAL LOW (ref 2.5–4.6)

## 2022-03-18 LAB — GLUCOSE, CAPILLARY
Glucose-Capillary: 124 mg/dL — ABNORMAL HIGH (ref 70–99)
Glucose-Capillary: 97 mg/dL (ref 70–99)

## 2022-03-18 LAB — LEGIONELLA PNEUMOPHILA SEROGP 1 UR AG: L. pneumophila Serogp 1 Ur Ag: NEGATIVE

## 2022-03-18 LAB — HEPARIN LEVEL (UNFRACTIONATED): Heparin Unfractionated: >1.1 IU/mL — ABNORMAL HIGH (ref 0.30–0.70)

## 2022-03-18 LAB — LACTIC ACID, PLASMA: Lactic Acid, Venous: 1.1 mmol/L (ref 0.5–1.9)

## 2022-03-18 MED ORDER — ONDANSETRON HCL 4 MG/2ML IJ SOLN
4.0000 mg | Freq: Once | INTRAMUSCULAR | Status: AC
  Administered 2022-03-18: 4 mg via INTRAVENOUS
  Filled 2022-03-18: qty 2

## 2022-03-18 MED ORDER — RIOCIGUAT 2 MG PO TABS
2.0000 mg | ORAL_TABLET | Freq: Three times a day (TID) | ORAL | Status: DC
  Administered 2022-03-18 – 2022-03-21 (×10): 2 mg via ORAL
  Filled 2022-03-18 (×11): qty 1

## 2022-03-18 MED ORDER — TORSEMIDE 20 MG PO TABS
30.0000 mg | ORAL_TABLET | Freq: Every day | ORAL | Status: DC
  Filled 2022-03-18: qty 1

## 2022-03-18 MED ORDER — GABAPENTIN 300 MG PO CAPS
300.0000 mg | ORAL_CAPSULE | Freq: Three times a day (TID) | ORAL | Status: DC
  Administered 2022-03-18 – 2022-03-21 (×9): 300 mg via ORAL
  Filled 2022-03-18 (×9): qty 1

## 2022-03-18 MED ORDER — DOXYCYCLINE HYCLATE 100 MG PO TABS
100.0000 mg | ORAL_TABLET | Freq: Two times a day (BID) | ORAL | Status: DC
  Administered 2022-03-18 – 2022-03-19 (×2): 100 mg via ORAL
  Filled 2022-03-18 (×2): qty 1

## 2022-03-18 MED ORDER — METOCLOPRAMIDE HCL 5 MG PO TABS
5.0000 mg | ORAL_TABLET | Freq: Three times a day (TID) | ORAL | Status: DC
  Administered 2022-03-18 – 2022-03-21 (×8): 5 mg via ORAL
  Filled 2022-03-18 (×9): qty 1

## 2022-03-18 MED ORDER — POTASSIUM PHOSPHATES 15 MMOLE/5ML IV SOLN
15.0000 mmol | Freq: Once | INTRAVENOUS | Status: AC
  Administered 2022-03-18: 15 mmol via INTRAVENOUS
  Filled 2022-03-18: qty 5

## 2022-03-18 MED ORDER — MAGNESIUM SULFATE 2 GM/50ML IV SOLN
2.0000 g | Freq: Once | INTRAVENOUS | Status: AC
  Administered 2022-03-18: 2 g via INTRAVENOUS
  Filled 2022-03-18: qty 50

## 2022-03-18 MED ORDER — PANTOPRAZOLE SODIUM 40 MG PO TBEC
40.0000 mg | DELAYED_RELEASE_TABLET | Freq: Every day | ORAL | Status: DC
  Administered 2022-03-18 – 2022-03-21 (×4): 40 mg via ORAL
  Filled 2022-03-18 (×4): qty 1

## 2022-03-18 MED ORDER — FINASTERIDE 5 MG PO TABS
5.0000 mg | ORAL_TABLET | Freq: Every day | ORAL | Status: DC
  Administered 2022-03-18 – 2022-03-21 (×4): 5 mg via ORAL
  Filled 2022-03-18 (×4): qty 1

## 2022-03-18 NOTE — Progress Notes (Signed)
ANTICOAGULATION CONSULT NOTE - Follow Up Consult  Pharmacy Consult for Heparin Indication: pulmonary embolus  Allergies  Allergen Reactions   Other Swelling    Beer- Swelling    Sunflower Oil Swelling   Sulfa Antibiotics Rash    Patient Measurements: Weight: 82.3 kg (181 lb 7 oz) Heparin Dosing Weight: 81.3 kg  Vital Signs: Temp: 97.6 F (36.4 C) (07/31 0000) Temp Source: Oral (07/31 0000) BP: 103/67 (07/31 0300) Pulse Rate: 69 (07/31 0300)  Labs: Recent Labs    03/16/22 0914 03/16/22 1140 03/16/22 2133 03/17/22 0710 03/17/22 1137 03/18/22 0337  HGB 11.2*  --   --  10.3*  --  10.9*  HCT 35.4*  --   --  31.3*  --  33.1*  PLT 156  --   --  139*  --  123*  APTT 36  --  98* 83*  --  111*  LABPROT 24.5*  --   --   --   --   --   INR 2.2*  --   --   --   --   --   HEPARINUNFRC  --    < > >1.10* >1.10*  --  >1.10*  CREATININE 3.06*  --   --  2.22*  --  1.53*  TROPONINIHS 633*   < > 8,808* 6,777* 5,875*  --    < > = values in this interval not displayed.     Estimated Creatinine Clearance: 39.4 mL/min (A) (by C-G formula based on SCr of 1.53 mg/dL (H)).   Medical History: Past Medical History:  Diagnosis Date   Arthritis    BPH (benign prostatic hyperplasia)    Chronic hiccups    COPD (chronic obstructive pulmonary disease) (HCC)    Folliculitis    posterior scalp per office visit note of Dr Maudie Mercury 07/20/2014     GERD (gastroesophageal reflux disease)    Gout    Hypertension    Hypotension    PE (pulmonary thromboembolism) ([place])    Phlebitis    right arm  at least 20 years ago    Pneumonia    hx of pneumonia as a child    Pulmonary hypertension ([place])    Sleep apnea     Medications:  Medications Prior to Admission  Medication Sig Dispense Refill Last Dose   albuterol (VENTOLIN HFA) 108 (90 Base) MCG/ACT inhaler Inhale 1 puff into the lungs every 4 (four) hours as needed for shortness of breath.   unknown   allopurinol (ZYLOPRIM) 100 MG tablet Take 100 mg  by mouth every morning.   03/15/2022   Azelastine HCl 0.15 % SOLN Place 2 sprays into the nose daily as needed (allergies).   unknown   Cinnamon 500 MG capsule Take 500 mg by mouth every morning.   03/15/2022   doxycycline (VIBRAMYCIN) 100 MG capsule Take 100 mg by mouth daily as needed (infection).   unknown   ELIQUIS 5 MG TABS tablet TAKE 1 TABLET BY MOUTH TWICE A DAY 60 tablet 11 03/15/2022 at 5:30pm   finasteride (PROSCAR) 5 MG tablet Take 5 mg by mouth at bedtime.   Past Week   gabapentin (NEURONTIN) 100 MG capsule Take 300 mg by mouth 3 (three) times daily.   03/15/2022   magnesium oxide (MAG-OX) 400 MG tablet Take 1 tablet (400 mg total) by mouth daily. 30 tablet 3 03/15/2022   metoCLOPramide (REGLAN) 5 MG tablet Take 5 mg by mouth 3 (three) times daily before meals.   03/15/2022  midodrine (PROAMATINE) 10 MG tablet Take 1.5 tablets (15 mg total) by mouth 3 (three) times daily with meals. 90 tablet 3 03/15/2022   Multiple Vitamin (MULTIVITAMIN WITH MINERALS) TABS tablet Take 1 tablet by mouth daily. 30 tablet 0 03/15/2022   ondansetron (ZOFRAN) 4 MG tablet Take 1 tablet (4 mg total) by mouth every 6 (six) hours as needed for nausea. 20 tablet 0 unknown   OXYGEN Inhale 2 L into the lungs continuous.   continuous   pantoprazole (PROTONIX) 40 MG tablet Take 40 mg by mouth every morning.   03/15/2022   potassium chloride (KLOR-CON) 10 MEQ tablet TAKE 2 TABLETS BY MOUTH 2 TIMES DAILY. (Patient taking differently: Take 20 mEq by mouth 2 (two) times daily.) 30 tablet 6 03/15/2022   Riociguat 2 MG TABS Take 2 mg by mouth in the morning, at noon, and at bedtime. 90 tablet 11 03/15/2022 at 5:30pm   STIOLTO RESPIMAT 2.5-2.5 MCG/ACT AERS INHALE 2 PUFFS BY MOUTH INTO THE LUNGS DAILY (Patient taking differently: Inhale 2 each into the lungs daily.) 4 g 5 03/15/2022   torsemide (DEMADEX) 20 MG tablet Take 1.5 tablets (30 mg total) by mouth daily. (Patient taking differently: Take 40 mg by mouth daily.) 180 tablet 2  03/15/2022   dapagliflozin propanediol (FARXIGA) 10 MG TABS tablet Take 1 tablet (10 mg total) by mouth daily before breakfast. 90 tablet 3 new rx    Scheduled:   arformoterol  15 mcg Nebulization BID   aspirin  81 mg Oral Daily   atorvastatin  80 mg Oral Daily   bisacodyl  10 mg Rectal QHS   Chlorhexidine Gluconate Cloth  6 each Topical Q0600   midodrine  15 mg Oral TID WC   pantoprazole (PROTONIX) IV  40 mg Intravenous QHS   revefenacin  175 mcg Nebulization Daily   sodium chloride flush  10-40 mL Intracatheter Q12H   Infusions:   sodium chloride 10 mL/hr at 03/17/22 0251   doxycycline (VIBRAMYCIN) IV Stopped (03/18/22 0538)   heparin 1,300 Units/hr (03/18/22 0629)   norepinephrine (LEVOPHED) Adult infusion 1.5 mcg/min (03/18/22 0600)   piperacillin-tazobactam (ZOSYN)  IV 12.5 mL/hr at 03/18/22 0600   potassium PHOSPHATE IVPB (in mmol) 15 mmol (03/18/22 9643)   promethazine (PHENERGAN) injection (IM or IVPB)     PRN: docusate sodium, polyethylene glycol, promethazine (PHENERGAN) injection (IM or IVPB), sodium chloride flush  Assessment: 47 yom with a history of PE on eliquis, COPD, HF, sleep apnea, CKD, GERD. Patient is presenting with sudden onset N/V and hiccups which progressed to SOB, fevers, elevated lactates, and AKI/ALF. Heparin per pharmacy consult placed for pulmonary embolus. Patient is on apixaban prior to arrival. Last dose 7/28pm. Will require aPTT monitoring due to likely falsely high anti-Xa level secondary to DOAC use.  aPTT 111 sec (supratherapeutic) on heparin infusion at 1300 units/hr.  Heparin level >1.1 (apixaban effects). Level drawn appropriately. No bleeding noted per RN.  LE doppler found age indeterminate DVT in R gastrocnemius veins.  Goal of Therapy:  Heparin level 0.3-0.7 units/ml aPTT 66-102 seconds Monitor platelets by anticoagulation protocol: Yes   Plan:  Decrease heparin to 1200 units/hr  8 hr aPTT Will f/u VQ scan  Sherlon Handing, PharmD,  BCPS Please see amion for complete clinical pharmacist phone list 03/18/2022 8:05 AM

## 2022-03-18 NOTE — Progress Notes (Signed)
NAME:  Joshua [name], MRN:  [id number], DOB:  1935-11-24, LOS: 2 ADMISSION DATE:  03/16/2022, CONSULTATION DATE:  03/16/22 REFERRING MD:  EDP, CHIEF COMPLAINT:  N/V   History of Present Illness:  86 year old man with myriad of chronic medical illnesses detailed below presenting with sudden onset N/V and hiccups yesterday.  No sick contacts.  Progressed to SOB, fevers.  Spouse at bedside corroborating history.  ROS as below.   Labs show lactic acidmia, acute renal/liver failure.  Patient started on broad spectrum abx, CT C/A/P possible colitis + constipation + aspiration vs. CAP.  Pertinent  Medical History  Big picture: multifactorial RH failure, bad COPD, CKD with recent FTT, weight loss  Workup in Sept 2022 for similar presentation showed nutcracker SMA-Aorta duodenal narrowing  Pulmonary HTN, diastolic CHF, h/o PE (6712), hypotension requiring midodrine, HTN, aortic atherosclerosis, OSA, h/o hiatial hernia, chronic respiratory failure with hypoixa, supplemental O2, COPD, h/p CAP, GErD, arthritis, CKD, BPH, hearing loss, gout  Significant Hospital Events: Including procedures, antibiotic start and stop dates in addition to other pertinent events   7/29 admit  Interim History / Subjective:  Levophed off this morning, alert and oriented with no complaints of pain or dyspnea.   Objective   Blood pressure 103/67, pulse 69, temperature 97.6 F (36.4 C), temperature source Oral, resp. rate 15, weight 82.3 kg, SpO2 100 %. CVP:  [4 mmHg-6 mmHg] 6 mmHg      Intake/Output Summary (Last 24 hours) at 03/18/2022 0723 Last data filed at 03/18/2022 0600 Gross per 24 hour  Intake 1131.11 ml  Output 2550 ml  Net -1418.89 ml    Filed Weights   03/16/22 1400  Weight: 82.3 kg    Examination: General: elderly male, resting in bed, no acute distress HENT: moist mucous membranes, sclera anicteric Lungs: coarse lung sounds, symmetric expansion, no increase work of breathing Cardiovascular: RRR,  warm and well perfused, no edema.  Abdomen: soft, +BS, nontender Extremities: no deformities Neuro: Alert and oriented, no focal deficits.  Skin: no rashes  Resolved Hospital Problem list   N/A  Assessment & Plan:  Septic Shock with borderline shock, lactic acidemia- preceding GI symptoms, findings on CT would point more toward a GI bug and subsequent aspiration.  Does have nutcracker duodenal obstruction but stomach looks fairly decompressed on CT. - Cultures NGTD  - Home midodrine resumed 7/30, levophed off 7/31 - Tolerated clear liquid diet, advance as tolerated - Continue Zosyn/Doxy, abx D 3  Chronic respiratory failure COPD without acute exacerbation OSA, does not wear CPAP - on HOT 2 L [place] as needed at home - Brovana/yupelri  FTT, weight loss, severe protein calorie malnutrition:  - has been seen by palliative as OP - Nutrition consult   Groups 2/4 pulm HTN  - on riociguat/AC PTA, continue to hold today - consider resuming as blood pressures start to improve  RBBB, long QT  Acute on chronic (CKD3a) renal failure  - Baseline creatinine:  - likely pre-renal + septic ATN, no obstruction on CT - Improving   Acute liver injury-  - RUQ with no acute abnormality and abdominal exam unremarkable - Trending down, continue supportive care  Previous PE on chronic a/c RLE DVT, chronic - 7/29 [place] with age indeterminate right DVT involving the right gastrocnemius veins - Heparin drip  Chronic HFpEF and RV Failure Elevated troponin - Cardiology following - Peak at 8,808 and trending down  - Aspirin/statin/heparin drip - home torsemide resumed  - Cards considering heart  cath  Chronic constipation - Bowel movement overnight, holding home reglan,   Best Practice (right click and "Reselect all SmartList Selections" daily)   Diet/type: clear liquids with advancement to heart healthy diet as tolerated DVT prophylaxis: systemic heparin GI prophylaxis: PPI Lines: N/A Foley:   N/A Activity: Ambulate/OOB TID Code Status:  full code Daughter at bedside and discussed plan of care  Labs   CBC: Recent Labs  Lab 03/16/22 0914 03/17/22 0710 03/18/22 0337  WBC 15.6* 10.1 6.6  NEUTROABS 14.3*  --   --   HGB 11.2* 10.3* 10.9*  HCT 35.4* 31.3* 33.1*  MCV 89.8 86.5 86.9  PLT 156 139* 123*     Basic Metabolic Panel: Recent Labs  Lab 03/15/22 0952 03/16/22 0914 03/17/22 0710 03/18/22 0337  NA 136 135 137 136  K 4.6 5.4* 4.3 3.9  CL 97* 97* 103 103  CO2 32 _0 GLUCOSE 117* 119* 75 115*  BUN 26* 37* 42* 28*  CREATININE 1.78* 3.06* 2.22* 1.53*  CALCIUM 10.6* 9.6 9.1 9.0  MG  --   --  2.0 1.8  PHOS  --   --  2.6 2.3*    GFR: Estimated Creatinine Clearance: 39.4 mL/min (A) (by C-G formula based on SCr of 1.53 mg/dL (H)). Recent Labs  Lab 03/16/22 0914 03/16/22 1140 03/16/22 1746 03/16/22 2133 03/17/22 0710 03/17/22 1137 03/18/22 0337  PROCALCITON  --   --   --   --   --  12.59  --   WBC 15.6*  --   --   --  10.1  --  6.6  LATICACIDVEN 6.8* 5.5* 1.7 1.5  --   --   --      Liver Function Tests: Recent Labs  Lab 03/16/22 0914 03/17/22 0710 03/18/22 0337  AST 359* 1,042* 893*  ALT 272* 1,022* 1,084*  ALKPHOS 99 92 84  BILITOT 1.7* 1.2 0.9  PROT 6.9 6.1* 6.1*  ALBUMIN 3.4* 2.9* 2.7*    Recent Labs  Lab 03/16/22 1140  LIPASE 31    No results for input(s): "AMMONIA" in the last 168 hours.  ABG    Component Value Date/Time   PHART 7.391 04/24/2021 0220   PCO2ART 60.5 (H) 04/24/2021 0220   PO2ART 38.5 (LL) 04/24/2021 0220   HCO3 33.4 (H) 10/04/2021 1008   TCO2 34 (H) 10/04/2021 1008   O2SAT 60.5 03/17/2022 1541     Coagulation Profile: Recent Labs  Lab 03/16/22 0914  INR 2.2*     Cardiac Enzymes: No results for input(s): "CKTOTAL", "CKMB", "CKMBINDEX", "TROPONINI" in the last 168 hours.  HbA1C: Hgb A1c MFr Bld  Date/Time Value Ref Range Status  03/16/2022 05:46 PM 5.0 4.8 - 5.6 % Final    Comment:     (NOTE) Pre diabetes:          5.7%-6.4%  Diabetes:              >6.4%  Glycemic control for   <7.0% adults with diabetes     CBG: Recent Labs  Lab 03/17/22 0337 03/17/22 0730 03/17/22 1117 03/17/22 1606 03/17/22 2055  GLUCAP 96 79 81 93 98     Critical care time: 40 minutes    Paulita Fujita, ACNP [facility] Office: [phone] See Amion PCCM on call pager [phone] until 7pm. Please call Elink 7p-7a. [phone]

## 2022-03-18 NOTE — Progress Notes (Addendum)
Advanced Heart Failure Rounding Note  PCP-Cardiologist: Sherren Mocha, MD   Subjective:    PCT 12.59. Covid and flu negative. UCx and BCx NGTD.  Stomach looks fairly decompressed on CT.  On Zosyn + Doxy. AF. WBC 6.6   Co-ox yesterday 61%. Not checked yet today. CVP ~3 overnight   Hs trop bumped to 8,808, now starting to trend down>>6,777>>5,875   Now off NE. SBPs low 100s. AKI improving, SCr 2.06>>2.22>>1.53 today  Overall feels much better. Denies CP. No n/v.     Objective:   Weight Range: 82.3 kg Body mass index is 24.61 kg/m.   Vital Signs:   Temp:  [97.6 F (36.4 C)-97.7 F (36.5 C)] 97.6 F (36.4 C) (07/31 0000) Pulse Rate:  [66-80] 69 (07/31 0300) Resp:  [9-24] 15 (07/31 0300) BP: (88-127)/(50-93) 103/67 (07/31 0300) SpO2:  [93 %-100 %] 100 % (07/31 0300) Last BM Date : 03/17/22  Weight change: Filed Weights   03/16/22 1400  Weight: 82.3 kg    Intake/Output:   Intake/Output Summary (Last 24 hours) at 03/18/2022 0805 Last data filed at 03/18/2022 0600 Gross per 24 hour  Intake 1090.54 ml  Output 2550 ml  Net -1459.46 ml      Physical Exam    General:  Well appearing elderly male. No resp difficulty HEENT: Normal Neck: Supple. JVD 7 cm . Carotids 2+ bilat; no bruits. No lymphadenopathy or thyromegaly appreciated. Cor: PMI nondisplaced. Regular rate & rhythm. No rubs, gallops or murmurs. Lungs: Clear Abdomen: Soft, nontender, nondistended. No hepatosplenomegaly. No bruits or masses. Good bowel sounds. Extremities: No cyanosis, clubbing, rash, edema Neuro: Alert & orientedx3, cranial nerves grossly intact. moves all 4 extremities w/o difficulty. Affect pleasant   Telemetry   NSR 80s   EKG    NSR w/ RBBB. No change from prior.   Labs    CBC Recent Labs    03/16/22 0914 03/17/22 0710 03/18/22 0337  WBC 15.6* 10.1 6.6  NEUTROABS 14.3*  --   --   HGB 11.2* 10.3* 10.9*  HCT 35.4* 31.3* 33.1*  MCV 89.8 86.5 86.9  PLT 156 139*  163*   Basic Metabolic Panel Recent Labs    03/17/22 0710 03/18/22 0337  NA 137 136  K 4.3 3.9  CL 103 103  CO2 29 29  GLUCOSE 75 115*  BUN 42* 28*  CREATININE 2.22* 1.53*  CALCIUM 9.1 9.0  MG 2.0 1.8  PHOS 2.6 2.3*   Liver Function Tests Recent Labs    03/17/22 0710 03/18/22 0337  AST 1,042* 893*  ALT 1,022* 1,084*  ALKPHOS 92 84  BILITOT 1.2 0.9  PROT 6.1* 6.1*  ALBUMIN 2.9* 2.7*   Recent Labs    03/16/22 1140  LIPASE 31   Cardiac Enzymes No results for input(s): "CKTOTAL", "CKMB", "CKMBINDEX", "TROPONINI" in the last 72 hours.  BNP: BNP (last 3 results) Recent Labs    12/27/21 1001 03/15/22 0952 03/16/22 0914  BNP 83.7 170.1* 380.3*    ProBNP (last 3 results) No results for input(s): "PROBNP" in the last 8760 hours.   D-Dimer No results for input(s): "DDIMER" in the last 72 hours. Hemoglobin A1C Recent Labs    03/16/22 1746  HGBA1C 5.0   Fasting Lipid Panel No results for input(s): "CHOL", "HDL", "LDLCALC", "TRIG", "CHOLHDL", "LDLDIRECT" in the last 72 hours. Thyroid Function Tests No results for input(s): "TSH", "T4TOTAL", "T3FREE", "THYROIDAB" in the last 72 hours.  Invalid input(s): "FREET3"  Other results:   Imaging  [place] EKG SITE RITE  Result Date: 03/17/2022 If Site Rite image not attached, placement could not be confirmed due to current cardiac rhythm.    Medications:     Scheduled Medications:  arformoterol  15 mcg Nebulization BID   aspirin  81 mg Oral Daily   atorvastatin  80 mg Oral Daily   bisacodyl  10 mg Rectal QHS   Chlorhexidine Gluconate Cloth  6 each Topical Q0600   midodrine  15 mg Oral TID WC   pantoprazole (PROTONIX) IV  40 mg Intravenous QHS   revefenacin  175 mcg Nebulization Daily   sodium chloride flush  10-40 mL Intracatheter Q12H    Infusions:  sodium chloride 10 mL/hr at 03/17/22 0251   doxycycline (VIBRAMYCIN) IV Stopped (03/18/22 0538)   heparin 1,300 Units/hr (03/18/22 0629)    norepinephrine (LEVOPHED) Adult infusion 1.5 mcg/min (03/18/22 0600)   piperacillin-tazobactam (ZOSYN)  IV 12.5 mL/hr at 03/18/22 0600   potassium PHOSPHATE IVPB (in mmol) 15 mmol (03/18/22 2683)   promethazine (PHENERGAN) injection (IM or IVPB)      PRN Medications: docusate sodium, polyethylene glycol, promethazine (PHENERGAN) injection (IM or IVPB), sodium chloride flush    Patient Profile   86 y/o male w/ chronic HFpEF/RV failure, PH felt multifactorial from severe emphysema, OSA and chronic PE, Stage III CKD and nutcracker duodenal obstruction admitted w/ shock, felt to be primarily septic shock. AHF team consulted for elevated Hs trop  Assessment/Plan   1. Shock: Concern for septic shock with presentation with nausea/vomiting/fever/abdominal pain and possible RLL aspiration PNA + possible colitis.  He is now on NE 4 with lactate down to 1.7. PCT 12.6 - Improving w/ abx on Zosyn + Doxy - now off NE w/ stable BP/MAPs  - UCx and BCx NGTD  - Diet restarted, can restart midodrine 15 mg tid (home dose)   2. Elevated troponin: HS-TnI up to 8818, no chest pain.  However, nausea/vomiting could be an atypical presentation of ischemia. ECG unchanged.  Could be demand ischemia in the setting of septic shock, but TnI is high for this.  Cannot rule out true ACS.  - Now that AKI has improved, SCr 2.06>>2.22>>1.53 today, will need to consider cath  - Would continue heparin gtt, + ASA 81 and atorvastatin 80 mg daily.  - Needs echo, ordered.  3. ID: Suspect aspiration PNA from CT chest (RLL infiltrate), also concern for possible colitis based on abdominal CT. Suspect septic shock as above.  - Continue vanc/Zosyn/doxycycline.  - follow PCT for clearance   4. Chronic HFpEF/RV failure: Echo (2/22) with EF 55-60%, IV septum flattened, severe RV enlargement, severely decreased RV function, PASP 57 mmHg.  He has severe RV failure.  Chronically NYHA III. He appears at least mildly volume overloaded on exam  but is not short of breath at rest. Received IVF resuscitation for treatment of sepsis  - AKI resolved, restart home torsemide, 30 mg daily - Hold dapagliflozin again today, possible restart tomorrow  - Repeat echo as above.  5. Pulmonary HTN: PAH noted on 4/22 RHC with PVR 6.1 WU.  This appears to be multifactorial with OSA, severe emphysema, and a suspected chronic PE involving the right middle lobe (group 3 and group 4 PH). Given the suspected mixed etiology with only 1 area of chronic thromboembolism (right middle lobe) as well as age, do not think that pulmonary thromboendarterectomy would be indicated.  Rheumatologic serologic workup was negative.  PFTs showed severe obstruction and moderate restriction, suggesting significant COPD.  -  Off riociguat while hypotensive, eventually need to restart.  6. AKI on stage III CKD: Creatinine 1.78 => 3.06 in setting of shock.   - improved w/ tx of sepsis, SCr down to 1.53 today  - SBP low 100s off NE. Restart home midodrine dose, 15 mg tid  7. OSA: Moderate OSA on sleep study. He refused CPAP. 8. Emphysema: Prior smoker.  Emphysema on CT and severe obstruction on PFTs. On 2L home oxygen.  9. Chronic PE: Diagnosed by V/Q scan.  No abnormal bleeding. He has a probable chronic RLE DVT on venous doppler this admission.  - Continue Eliquis.  10. Hiatial hernia w/ esophagitis: Has significant GERD. He follows with Dr. Benson [place]. - Continue PPI.  11. Nutcracker SMA: Compresses duodenum.  This does not appear to be causing his current symptoms.   Length of Stay: 2  Lyda Jester, PA-C  03/18/2022, 8:05 AM  Advanced Heart Failure Team Pager [phone] (M-F; 7a - 5p)  Please contact [facility] for night-coverage after hours (5p -7a ) and weekends on [email or web address]  Patient seen with PA, agree with the above note.  CVP 4 today, co-ox 61%.  Creatinine down to 1.53.  He is now off NE with stable BP.  PCT was elevated at 12.59, he remains on broad spectrum abx.    He feels better, denies chest pain or dyspnea.  No nausea.  No abdominal pain.   General: NAD Neck: No JVD, no thyromegaly or thyroid nodule.  Lungs: Clear to auscultation bilaterally with normal respiratory effort. CV: Nondisplaced PMI.  Heart regular S1/S2, no S3/S4, no murmur.  No peripheral edema.   Abdomen: Soft, nontender, no hepatosplenomegaly, no distention.  Skin: Intact without lesions or rashes.  Neurologic: Alert and oriented x 3.  Psych: Normal affect. Extremities: No clubbing or cyanosis.  HEENT: Normal.   Creatinine now back to 1.53.  CVP 4, think we can continue to hold diuretics today.   He is off NE with stable BP, septic shock appears resolved.  Can start him back on home riociguat 2 mg tid.   PCT elevated, possible aspiration PNA and also concern for colitis.  He remains on broad spectrum abx per CCM.   No chest pain, troponin trending down.  ?ACS with plaque rupture versus demand ischemia with septic shock.   He remains on heparin gtt, ASA, statin.  He will need RHC/LHC.  Will schedule for later this week once AKI appears fully resolved.   Loralie Champagne 03/18/2022 9:49 AM

## 2022-03-18 NOTE — Progress Notes (Signed)
  Echocardiogram 2D Echocardiogram has been performed.  Joshua [name] 03/18/2022, 3:09 PM

## 2022-03-18 NOTE — Progress Notes (Signed)
ANTICOAGULATION CONSULT NOTE  Pharmacy Consult for Heparin Indication: pulmonary embolus  Allergies  Allergen Reactions   Other Swelling    Beer- Swelling    Sunflower Oil Swelling   Sulfa Antibiotics Rash    Patient Measurements: Weight: 82.3 kg (181 lb 7 oz) Heparin Dosing Weight: 81.3 kg  Vital Signs: Temp: 97.7 F (36.5 C) (07/31 1939) Temp Source: Oral (07/31 1939) BP: 87/50 (07/31 2000) Pulse Rate: 67 (07/31 2000)  Labs: Recent Labs    03/16/22 0914 03/16/22 1140 03/16/22 2133 03/17/22 0710 03/17/22 1137 03/18/22 0337 03/18/22 1839  HGB 11.2*  --   --  10.3*  --  10.9*  --   HCT 35.4*  --   --  31.3*  --  33.1*  --   PLT 156  --   --  139*  --  123*  --   APTT 36  --  98* 83*  --  111* 102*  LABPROT 24.5*  --   --   --   --   --   --   INR 2.2*  --   --   --   --   --   --   HEPARINUNFRC  --    < > >1.10* >1.10*  --  >1.10*  --   CREATININE 3.06*  --   --  2.22*  --  1.53*  --   TROPONINIHS 633*   < > 8,808* 6,777* 5,875*  --   --    < > = values in this interval not displayed.     Estimated Creatinine Clearance: 39.4 mL/min (A) (by C-G formula based on SCr of 1.53 mg/dL (H)).   Assessment: 49 yom with a history of PE on eliquis, COPD, HF, sleep apnea, CKD, GERD. Patient is presenting with sudden onset N/V and hiccups which progressed to SOB, fevers, elevated lactates, and AKI/ALF. Heparin per pharmacy consult placed for pulmonary embolus.  Patient is on apixaban prior to arrival. Last dose 7/28pm. Will require aPTT monitoring due to likely falsely high anti-Xa level secondary to DOAC use.  LE doppler found age indeterminate DVT in R gastrocnemius veins.  aPTT therapeutic at 102 sec and at the high end of normal.  RN reports minimal bleeding at pIV site.  Goal of Therapy:  Heparin level 0.3-0.7 units/ml aPTT 66-102 seconds Monitor platelets by anticoagulation protocol: Yes   Plan:  Decrease heparin infusion to 1100 units/hr  F/u AM labs, VQ scan  result Monitor for bleeding resolution  Evelina Lore D. Mina Marble, PharmD, BCPS, [place] 03/18/2022, 8:26 PM

## 2022-03-19 ENCOUNTER — Encounter (HOSPITAL_COMMUNITY): Payer: Medicare Other

## 2022-03-19 DIAGNOSIS — J439 Emphysema, unspecified: Secondary | ICD-10-CM | POA: Diagnosis present

## 2022-03-19 DIAGNOSIS — I214 Non-ST elevation (NSTEMI) myocardial infarction: Secondary | ICD-10-CM | POA: Diagnosis present

## 2022-03-19 DIAGNOSIS — J432 Centrilobular emphysema: Secondary | ICD-10-CM

## 2022-03-19 DIAGNOSIS — I5032 Chronic diastolic (congestive) heart failure: Secondary | ICD-10-CM | POA: Diagnosis not present

## 2022-03-19 DIAGNOSIS — N179 Acute kidney failure, unspecified: Secondary | ICD-10-CM | POA: Diagnosis present

## 2022-03-19 DIAGNOSIS — I2609 Other pulmonary embolism with acute cor pulmonale: Secondary | ICD-10-CM

## 2022-03-19 DIAGNOSIS — N189 Chronic kidney disease, unspecified: Secondary | ICD-10-CM | POA: Diagnosis present

## 2022-03-19 DIAGNOSIS — E785 Hyperlipidemia, unspecified: Secondary | ICD-10-CM | POA: Diagnosis present

## 2022-03-19 DIAGNOSIS — R6520 Severe sepsis without septic shock: Secondary | ICD-10-CM | POA: Diagnosis not present

## 2022-03-19 DIAGNOSIS — A419 Sepsis, unspecified organism: Secondary | ICD-10-CM | POA: Diagnosis not present

## 2022-03-19 DIAGNOSIS — I10 Essential (primary) hypertension: Secondary | ICD-10-CM

## 2022-03-19 DIAGNOSIS — I2782 Chronic pulmonary embolism: Secondary | ICD-10-CM

## 2022-03-19 HISTORY — DX: Non-ST elevation (NSTEMI) myocardial infarction: I21.4

## 2022-03-19 LAB — CBC
HCT: 33.2 % — ABNORMAL LOW (ref 39.0–52.0)
Hemoglobin: 10.6 g/dL — ABNORMAL LOW (ref 13.0–17.0)
MCH: 27.9 pg (ref 26.0–34.0)
MCHC: 31.9 g/dL (ref 30.0–36.0)
MCV: 87.4 fL (ref 80.0–100.0)
Platelets: 150 10*3/uL (ref 150–400)
RBC: 3.8 MIL/uL — ABNORMAL LOW (ref 4.22–5.81)
RDW: 14.1 % (ref 11.5–15.5)
WBC: 5 10*3/uL (ref 4.0–10.5)
nRBC: 0 % (ref 0.0–0.2)

## 2022-03-19 LAB — COMPREHENSIVE METABOLIC PANEL
ALT: 1260 U/L — ABNORMAL HIGH (ref 0–44)
AST: 901 U/L — ABNORMAL HIGH (ref 15–41)
Albumin: 2.5 g/dL — ABNORMAL LOW (ref 3.5–5.0)
Alkaline Phosphatase: 81 U/L (ref 38–126)
Anion gap: 8 (ref 5–15)
BUN: 17 mg/dL (ref 8–23)
CO2: 28 mmol/L (ref 22–32)
Calcium: 8.8 mg/dL — ABNORMAL LOW (ref 8.9–10.3)
Chloride: 102 mmol/L (ref 98–111)
Creatinine, Ser: 1.39 mg/dL — ABNORMAL HIGH (ref 0.61–1.24)
GFR, Estimated: 50 mL/min — ABNORMAL LOW (ref >60)
Glucose, Bld: 96 mg/dL (ref 70–99)
Potassium: 3.8 mmol/L (ref 3.5–5.1)
Sodium: 138 mmol/L (ref 135–145)
Total Bilirubin: 0.7 mg/dL (ref 0.3–1.2)
Total Protein: 6.6 g/dL (ref 6.5–8.1)

## 2022-03-19 LAB — PHOSPHORUS: Phosphorus: 2.4 mg/dL — ABNORMAL LOW (ref 2.5–4.6)

## 2022-03-19 LAB — GLUCOSE, CAPILLARY: Glucose-Capillary: 83 mg/dL (ref 70–99)

## 2022-03-19 LAB — MAGNESIUM: Magnesium: 1.8 mg/dL (ref 1.7–2.4)

## 2022-03-19 LAB — HEPARIN LEVEL (UNFRACTIONATED): Heparin Unfractionated: >1.1 IU/mL — ABNORMAL HIGH (ref 0.30–0.70)

## 2022-03-19 LAB — APTT: aPTT: 70 seconds — ABNORMAL HIGH (ref 24–36)

## 2022-03-19 MED ORDER — SODIUM CHLORIDE 0.9 % IV SOLN
1.5000 g | Freq: Four times a day (QID) | INTRAVENOUS | Status: DC
  Administered 2022-03-19 – 2022-03-20 (×4): 1.5 g via INTRAVENOUS
  Filled 2022-03-19 (×6): qty 4

## 2022-03-19 MED ORDER — SODIUM CHLORIDE 0.9% FLUSH
3.0000 mL | Freq: Two times a day (BID) | INTRAVENOUS | Status: DC
  Administered 2022-03-20 (×2): 3 mL via INTRAVENOUS

## 2022-03-19 NOTE — Assessment & Plan Note (Addendum)
Echocardiogram with preserved LV systolic function EF 60 to 65%. No wall motion abnormalities. Interventricular septum flattened in systolic. RV with severe reduction in systolic function. RVSP 49,7 mmHg (suboptimal TR jet assessment), severe dilatation RA.   Severe pulmonary hypertension, possible class 1 or 4.  Urine output 3,100 ml over last 24 hrs. Clinically euvolemic.   Continue midodrine for blood pressure support. Continue to hold on diuretic therapy for now.

## 2022-03-19 NOTE — Assessment & Plan Note (Addendum)
Septic shock present on admission.  Cultures with no growth.  Clinical presentation consistent with aspiration pneumonia, right lower lobe. (present on admission).   Wbc 5,0, patient has been afebrile.  Elevated liver enzymes due to liver shock and right sided heart failure.  AST 901 and ALT 1,260, total Bil 0.7 Liver US with no parenchymal abnormalities and no biliary duct dilatation.    Plan to continue antibiotic therapy with Ventura Sellers if continue to improve, will plan to transition to oral Augmentin.

## 2022-03-19 NOTE — Hospital Course (Addendum)
Joshua [name] was admitted to the hospital with the working diagnosis of septic shock.  86 yo male with the past medical history of diastolic heart failure, hypertension, COPD with chronic hypoxemic respiratory failure, and CKD who presented with nausea and vomiting. 24 hrs of gastrointestinal symptoms that progressed into dyspnea. Associated with fever. On his initial physical examination his blood pressure was 102/53, HR 74, RR 11 and 02 saturation 97%, lungs with rales bilaterally at bases, more right than left, heart with S1 and S2 present and rhythmic, abdomen soft and not distended, + lower extremity edema.   Na 136, K 4,6 CL 97, bicarbonate 32 glucose 117 bun 26 cr 1,78  BNP 170 High sensitive troponin 633  Lactic acid 6,8  Wbc 15, hgb 11,2 plt 156 INR 2.2 Sars COVID 29 negative   Chest radiograph with prominent pulmonary vasculature, with bilateral atelectasis  Chest Ct with diffuse centrolobular emphysema, with right lower lobe infiltrate. Severe coronary calcification. Mild mesenteric stranding at the right lower abdomen and pelvis.   EKG 75 bpm, normal axis, right bundle branch block, sinus rhythm with no significant ST segment or T wave changes.   Patient was placed on IV fluids and broad spectrum antibiotic therapy.  He developed shock and required vasopressor therapy with norepinephrine.   Troponin peaked at 8,808. Anticoagulation was continued with heparin drip with good toleration.  Echocardiogram with severe reduction in RV systolic function, LV EF 60 to 65% with no regional wall motion abnormalities.   Patient regain hemodynamic stability and was wean off norepinephrine.  Transferred to [facility] on 08/01.   08/03 changed antibiotic therapy to oral Augmentin.Plan for cardiac catheterization in am, right and left heart.

## 2022-03-19 NOTE — Evaluation (Signed)
Physical Therapy Evaluation Patient Details Name: Joshua [name] MRN: [id number] DOB: 28-Sep-1935 Today's Date: 03/19/2022  History of Present Illness  Pt is an 86 y.o. male admitted 03/16/22 with nausea, vomiting and progressive dyspnea; workup for septic shock secondary to aspiration pneumonitis vs colitis. PMH includes CHF, HTN, COPD, CKD, OSA, PE, CKD.   Clinical Impression  Pt presents with an overall decrease in functional mobility secondary to above. PTA, pt reports independent with ambulation and ADLs, lives with wife. Today, pt moving well with initial min guard for balance. Pt endorses SOB with mobility, denies dizziness; see vitals below, RN notified. Pt would benefit from continued acute PT services to maximize functional mobility and independence prior to d/c home.     Post-ambulation seated BP 78/46 (MAP 57) Seated 2-min BP 80/50 (MAP 59)  SpO2 down to 84% on RA with ambulation SpO2 >/90% on 2L O2 [place]   Recommendations for follow up therapy are one component of a multi-disciplinary discharge planning process, led by the attending physician.  Recommendations may be updated based on patient status, additional functional criteria and insurance authorization.  Follow Up Recommendations No PT follow up ([facility]'s pulmonary rehab phase II - planned to start this before admission)      Assistance Recommended at Discharge Intermittent Supervision/Assistance  Patient can return home with the following  A little help with bathing/dressing/bathroom;Assistance with cooking/housework;Assist for transportation;Help with stairs or ramp for entrance    Equipment Recommendations None recommended by PT  Recommendations for Other Services       Functional Status Assessment Patient has had a recent decline in their functional status and demonstrates the ability to make significant improvements in function in a reasonable and predictable amount of time.     Precautions / Restrictions  Precautions Precautions: Fall;Other (comment) Precaution Comments: Watch BP/MAP Restrictions Weight Bearing Restrictions: No      Mobility  Bed Mobility Overal bed mobility: Independent                  Transfers Overall transfer level: Needs assistance Equipment used: None Transfers: Sit to/from Stand Sit to Stand: Min guard                Ambulation/Gait Ambulation/Gait assistance: Min guard, Supervision Gait Distance (Feet): 340 Feet Assistive device: IV Pole, None Gait Pattern/deviations: Step-through pattern, Decreased stride length Gait velocity: Decreased     General Gait Details: slow, mostly steady gait with initial use of IV pole, progressing to no UE support; min guard progressing to supervision for safety/lines; DOE 3/4; noted post-ambulation hypotension, 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    Modified Rankin (Stroke Patients Only)       Balance Overall balance assessment: Needs assistance Sitting-balance support: No upper extremity supported, Feet supported Sitting balance-Leahy Scale: Good     Standing balance support: No upper extremity supported, During functional activity Standing balance-Leahy Scale: Good               High level balance activites: Direction changes, Turns, Head turns, Sudden stops High Level Balance Comments: no overt instability or LOB with higher level balance tasks             Pertinent Vitals/Pain Pain Assessment Pain Assessment: No/denies pain    Home Living Family/patient expects to be discharged to:: Private residence Living Arrangements: Spouse/significant other Available Help at Discharge: Family;Available 24 hours/day Type of Home: House Home Access: Stairs to enter  Entrance Stairs-Rails: Right Entrance Stairs-Number of Steps: 5 Alternate Level Stairs-Number of Steps: 6 + landing with seat Home Layout: Two level;Bed/bath upstairs Home Equipment:  Rolling Walker (2 wheels);Cane - single point;BSC/3in1;Shower seat Additional Comments: supplemental O2 use PRN    Prior Function Prior Level of Function : Independent/Modified Independent             Mobility Comments: Indep without DME; reports no issues navigating stairs 2-3x daily, but spends most of day upstairs. Reports last fall was 06/2021 ADLs Comments: reports indep without DME. Wife does majority of household tasks and driving. Pt hires assist for yard upkeep     Hand Dominance        Extremity/Trunk Assessment   Upper Extremity Assessment Upper Extremity Assessment: Generalized weakness    Lower Extremity Assessment Lower Extremity Assessment: Generalized weakness       Communication   Communication: No difficulties  Cognition Arousal/Alertness: Awake/alert Behavior During Therapy: WFL for tasks assessed/performed Overall Cognitive Status: Within Functional Limits for tasks assessed                                          General Comments General comments (skin integrity, edema, etc.): HR 90; post-ambulation BP 78/46 (57), seated 2-min BP 80/50 (59) - RN notified. SpO2 84% on RA, replaced 2L O2 [place] with SpO2 >/90%    Exercises     Assessment/Plan    PT Assessment Patient needs continued PT services  PT Problem List Decreased strength;Decreased activity tolerance;Decreased balance;Decreased mobility;Cardiopulmonary status limiting activity       PT Treatment Interventions DME instruction;Gait training;Stair training;Functional mobility training;Therapeutic activities;Therapeutic exercise;Balance training;Patient/family education    PT Goals (Current goals can be found in the Care Plan section)  Acute Rehab PT Goals Patient Stated Goal: was supposed to start pulmonary rehab at [facility] before getting sick, still plans to do this PT Goal Formulation: With patient Time For Goal Achievement: 04/02/22 Potential to Achieve Goals: Good     Frequency Min 3X/week     Co-evaluation               AM-PAC PT "6 Clicks" Mobility  Outcome Measure Help needed turning from your back to your side while in a flat bed without using bedrails?: None Help needed moving from lying on your back to sitting on the side of a flat bed without using bedrails?: None Help needed moving to and from a bed to a chair (including a wheelchair)?: A Little Help needed standing up from a chair using your arms (e.g., wheelchair or bedside chair)?: A Little Help needed to walk in hospital room?: A Little Help needed climbing 3-5 steps with a railing? : A Little 6 Click Score: 20    End of Session Equipment Utilized During Treatment: Gait belt Activity Tolerance: Patient tolerated treatment well Patient left: in chair;with call bell/phone within reach;with chair alarm set Nurse Communication: Mobility status;Other (comment) (low BP and low MAP) PT Visit Diagnosis: Other abnormalities of gait and mobility (R26.89)    Time: 6147-0929 PT Time Calculation (min) (ACUTE ONLY): 24 min   Charges:   PT Evaluation $PT Eval Moderate Complexity: [place], PT, DPT Acute Rehabilitation Services  Personal: [place] [facility] Office: [employer] 03/19/2022, 3:41 PM

## 2022-03-19 NOTE — Assessment & Plan Note (Signed)
Continue midodrine for blood pressure support. Will continue close monitoring.

## 2022-03-19 NOTE — Progress Notes (Addendum)
Advanced Heart Failure Rounding Note  PCP-Cardiologist: Sherren Mocha, MD   Subjective:    Admitted w/ septic shock due to aspiration pneumonia vs colitis  w/ significantly elevated HS trop.   Much improved w/ abx. BP now stable off NE support. Back on home midodrine regimen. AKI resolved. Transferred out of ICU.   SCr 2.06>>2.22>>1.53>>1.39 today  CVP ~3. Resting comfortably. No current pain/ distress. Family present at bedside and report that he did endorse "indigestion/ burning" prior to admit.   Echo 7/31: RV severely reduced, severely enlarged, RVSP 50, LVEF 60-65%, no RWMA, mild TR, trivial MR.    Objective:   Weight Range: 82.3 kg Body mass index is 24.62 kg/m.   Vital Signs:   Temp:  [97.6 F (36.4 C)-98 F (36.7 C)] 97.6 F (36.4 C) (08/01 0747) Pulse Rate:  [62-89] 72 (08/01 0747) Resp:  [12-25] 15 (08/01 0747) BP: (80-128)/(49-109) 90/55 (08/01 0747) SpO2:  [95 %-100 %] 96 % (08/01 0824) Weight:  [82.3 kg] 82.3 kg (08/01 0414) Last BM Date : 03/17/22  Weight change: Filed Weights   03/16/22 1400 03/19/22 0414  Weight: 82.3 kg 82.3 kg    Intake/Output:   Intake/Output Summary (Last 24 hours) at 03/19/2022 0945 Last data filed at 03/19/2022 0421 Gross per 24 hour  Intake 1204.96 ml  Output 2850 ml  Net -1645.04 ml      Physical Exam   General:  elderly male. No respiratory difficulty HEENT: normal Neck: supple. JVD not elevated. Carotids 2+ bilat; no bruits. No lymphadenopathy or thyromegaly appreciated. Cor: PMI nondisplaced. Regular rate & rhythm. No rubs, gallops or murmurs. Lungs: clear Abdomen: soft, nontender, nondistended. No hepatosplenomegaly. No bruits or masses. Good bowel sounds. Extremities: no cyanosis, clubbing, rash, edema Neuro: alert & oriented x 3, cranial nerves grossly intact. moves all 4 extremities w/o difficulty. Affect pleasant.   Telemetry   NSR 80s   EKG    NSR w/ RBBB. No change from prior.   Labs     CBC Recent Labs    03/18/22 0337 03/19/22 0403  WBC 6.6 5.0  HGB 10.9* 10.6*  HCT 33.1* 33.2*  MCV 86.9 87.4  PLT 123* 397   Basic Metabolic Panel Recent Labs    03/18/22 0337 03/19/22 0403  NA 136 138  K 3.9 3.8  CL 103 102  CO2 29 28  GLUCOSE 115* 96  BUN 28* 17  CREATININE 1.53* 1.39*  CALCIUM 9.0 8.8*  MG 1.8 1.8  PHOS 2.3* 2.4*   Liver Function Tests Recent Labs    03/18/22 0337 03/19/22 0403  AST 893* 901*  ALT 1,084* 1,260*  ALKPHOS 84 81  BILITOT 0.9 0.7  PROT 6.1* 6.6  ALBUMIN 2.7* 2.5*   Recent Labs    03/16/22 1140  LIPASE 31   Cardiac Enzymes No results for input(s): "CKTOTAL", "CKMB", "CKMBINDEX", "TROPONINI" in the last 72 hours.  BNP: BNP (last 3 results) Recent Labs    12/27/21 1001 03/15/22 0952 03/16/22 0914  BNP 83.7 170.1* 380.3*    ProBNP (last 3 results) No results for input(s): "PROBNP" in the last 8760 hours.   D-Dimer No results for input(s): "DDIMER" in the last 72 hours. Hemoglobin A1C Recent Labs    03/16/22 1746  HGBA1C 5.0   Fasting Lipid Panel No results for input(s): "CHOL", "HDL", "LDLCALC", "TRIG", "CHOLHDL", "LDLDIRECT" in the last 72 hours. Thyroid Function Tests No results for input(s): "TSH", "T4TOTAL", "T3FREE", "THYROIDAB" in the last 72 hours.  Invalid input(s): "  FREET3"  Other results:   Imaging    ECHOCARDIOGRAM COMPLETE  Result Date: 03/18/2022    ECHOCARDIOGRAM REPORT   Patient Name:   DEMETRIES COIA Date of Exam: 03/18/2022 Medical Rec #:  [id number]        Height:       72.0 in Accession #:    [id number]       Weight:       181.4 lb Date of Birth:  1936-07-13         BSA:          2.044 m Patient Age:    86 years         BP:           82/68 mmHg Patient Gender: M                HR:           73 bpm. Exam Location:  Inpatient Procedure: 2D Echo, Cardiac Doppler and Color Doppler Indications:    CHF-Acute Diastolic G99.24  History:        Patient has prior history of Echocardiogram  examinations, most                 recent 09/29/2020. Pulmonary HTN and COPD,                 Signs/Symptoms:Hypotension; Risk Factors:Sleep Apnea,                 Hypertension and GERD.  Sonographer:    Bernadene Person RDCS Referring Phys: [place]  1. Right ventricular systolic function is severely reduced. The right ventricular size is severely enlarged. There is moderately elevated pulmonary artery systolic pressure. The estimated right ventricular systolic pressure is 26.8 mmHg. PASP may be underestimated in the setting of a suboptimal TR jet.  2. Left ventricular ejection fraction, by estimation, is 60 to 65%. The left ventricle has normal function. The left ventricle has no regional wall motion abnormalities. Left ventricular diastolic parameters are consistent with Grade I diastolic dysfunction (impaired relaxation). There is the interventricular septum is flattened in systole, consistent with right ventricular pressure overload.  3. Right atrial size was severely dilated.  4. The mitral valve is grossly normal. Trivial mitral valve regurgitation. No evidence of mitral stenosis.  5. The aortic valve is tricuspid. There is mild calcification of the aortic valve. There is mild thickening of the aortic valve. Aortic valve regurgitation is mild to moderate.  6. Aortic dilatation noted. There is borderline dilatation of the aortic root, measuring 38 mm.  7. The inferior vena cava is dilated in size with >50% respiratory variability, suggesting right atrial pressure of 8 mmHg. Comparison(s): Compared to prior TTE on 09/2020, there is no significant change. Continues to have RV failure. FINDINGS  Left Ventricle: Left ventricular ejection fraction, by estimation, is 60 to 65%. The left ventricle has normal function. The left ventricle has no regional wall motion abnormalities. The left ventricular internal cavity size was normal in size. There is  no left ventricular hypertrophy. The  interventricular septum is flattened in systole, consistent with right ventricular pressure overload. Left ventricular diastolic parameters are consistent with Grade I diastolic dysfunction (impaired relaxation). Right Ventricle: The right ventricular size is severely enlarged. No increase in right ventricular wall thickness. Right ventricular systolic function is severely reduced. There is moderately elevated pulmonary artery systolic pressure. The tricuspid regurgitant velocity is 3.23 m/s, and with an assumed right atrial pressure  of 8 mmHg, the estimated right ventricular systolic pressure is 67.6 mmHg. Left Atrium: Left atrial size was normal in size. Right Atrium: Right atrial size was severely dilated. Pericardium: There is no evidence of pericardial effusion. Mitral Valve: The mitral valve is grossly normal. Trivial mitral valve regurgitation. No evidence of mitral valve stenosis. Tricuspid Valve: The tricuspid valve is normal in structure. Tricuspid valve regurgitation is mild. Aortic Valve: The aortic valve is tricuspid. There is mild calcification of the aortic valve. There is mild thickening of the aortic valve. Aortic valve regurgitation is mild to moderate. Aortic regurgitation PHT measures 288 msec. Pulmonic Valve: The pulmonic valve was normal in structure. Pulmonic valve regurgitation is mild. Aorta: Aortic dilatation noted. There is borderline dilatation of the aortic root, measuring 38 mm. Venous: The inferior vena cava is dilated in size with greater than 50% respiratory variability, suggesting right atrial pressure of 8 mmHg. IAS/Shunts: There is left bowing of the interatrial septum, suggestive of elevated right atrial pressure. The atrial septum is grossly normal.  LEFT VENTRICLE PLAX 2D LVIDd:         4.90 cm   Diastology LVIDs:         2.50 cm   LV e' medial:    5.87 cm/s LV PW:         0.80 cm   LV E/e' medial:  8.3 LV IVS:        0.80 cm   LV e' lateral:   10.30 cm/s LVOT diam:     2.10 cm    LV E/e' lateral: 4.7 LV SV:         49 LV SV Index:   24 LVOT Area:     3.46 cm  RIGHT VENTRICLE RV S prime:     12.50 cm/s TAPSE (M-mode): 2.0 cm LEFT ATRIUM           Index        RIGHT ATRIUM           Index LA diam:      3.30 cm 1.61 cm/m   RA Area:     24.30 cm LA Vol (A2C): 41.0 ml 20.06 ml/m  RA Volume:   80.70 ml  39.48 ml/m LA Vol (A4C): 45.0 ml 22.01 ml/m  AORTIC VALVE                   PULMONIC VALVE LVOT Vmax:         81.80 cm/s  PR End Diast Vel: 7.84 msec LVOT Vmean:        52.000 cm/s LVOT VTI:          0.142 m AI PHT:            288 msec AR Vena Contracta: 0.20 cm  AORTA Ao Root diam: 3.80 cm Ao Asc diam:  3.30 cm MITRAL VALVE               TRICUSPID VALVE MV Area (PHT): 2.85 cm    TR Peak grad:   41.7 mmHg MV Decel Time: 266 msec    TR Vmax:        323.00 cm/s MV E velocity: 48.50 cm/s MV A velocity: 62.90 cm/s  SHUNTS MV E/A ratio:  0.77        Systemic VTI:  0.14 m                            Systemic Diam: 2.10  cm Gwyndolyn Kaufman MD Electronically signed by Gwyndolyn Kaufman MD Signature Date/Time: 03/18/2022/4:46:24 PM    Final      Medications:     Scheduled Medications:  arformoterol  15 mcg Nebulization BID   aspirin  81 mg Oral Daily   atorvastatin  80 mg Oral Daily   bisacodyl  10 mg Rectal QHS   Chlorhexidine Gluconate Cloth  6 each Topical Q0600   doxycycline  100 mg Oral Q12H   finasteride  5 mg Oral Daily   gabapentin  300 mg Oral TID   metoCLOPramide  5 mg Oral TID AC   midodrine  15 mg Oral TID WC   pantoprazole  40 mg Oral Daily   revefenacin  175 mcg Nebulization Daily   Riociguat  2 mg Oral TID   sodium chloride flush  10-40 mL Intracatheter Q12H    Infusions:  sodium chloride 10 mL/hr at 03/17/22 0251   heparin 1,100 Units/hr (03/19/22 0601)   piperacillin-tazobactam (ZOSYN)  IV 3.375 g (03/19/22 0559)   promethazine (PHENERGAN) injection (IM or IVPB)      PRN Medications: docusate sodium, polyethylene glycol, promethazine (PHENERGAN)  injection (IM or IVPB), sodium chloride flush    Patient Profile   86 y/o male w/ chronic HFpEF/RV failure, PH felt multifactorial from severe emphysema, OSA and chronic PE, Stage III CKD and nutcracker duodenal obstruction admitted w/ shock, felt to be primarily septic shock. AHF team consulted for elevated Hs trop  Assessment/Plan   1. Shock: Concern for septic shock with presentation with nausea/vomiting/fever/abdominal pain and possible RLL aspiration PNA + possible colitis. PCT 12.6 - Improving w/ abx on Zosyn + Doxy. - now off NE w/ stable BP/MAPs  - UCx and BCx NGTD  - BP stable on midodrine 15 mg tid (home dose)   2. Elevated troponin: HS-TnI up to 8818, no chest pain.  However, nausea/vomiting could be an atypical presentation of ischemia. ECG unchanged.  Could be demand ischemia in the setting of septic shock, but TnI is high for this.  Cannot rule out true ACS. Per family report, he did endorse chest pain/indigestion prior to admit  - Now that AKI has improved, SCr 2.06>>2.22>>1.53>>1.39 today, will plan [facility], likely tomorrow  - Would continue heparin gtt, + ASA 81 and atorvastatin 80 mg daily.  - Echo stable w/ chronic RV failure, LVEF normal 60-65%, no RWMA  3. ID: Suspect aspiration PNA from CT chest (RLL infiltrate), also concern for possible colitis based on abdominal CT. Suspect septic shock as above.  - Continue Zosyn/doxycycline.  4. Chronic HFpEF/RV failure: Echo (2/22) with EF 55-60%, IV septum flattened, severe RV enlargement, severely decreased RV function, PASP 57 mmHg.  He has severe RV failure.  Chronically NYHA III. He appears at least mildly volume overloaded on exam but is not short of breath at rest. Received IVF resuscitation for treatment of sepsis  - Echo this admit>>RV severely reduced, severely enlarged, RVSP 50, LVEF 60-65%, no RWMA, mild TR, trivial MR. - AKI resolved but CVP low. Hold diuretics again today - Hold dapagliflozin again today, possible restart  tomorrow  5. Pulmonary HTN: PAH noted on 4/22 RHC with PVR 6.1 WU.  This appears to be multifactorial with OSA, severe emphysema, and a suspected chronic PE involving the right middle lobe (group 3 and group 4 PH). Given the suspected mixed etiology with only 1 area of chronic thromboembolism (right middle lobe) as well as age, do not think that pulmonary thromboendarterectomy would be indicated.  Rheumatologic serologic workup was negative.  PFTs showed severe obstruction and moderate restriction, suggesting significant COPD.  - He is back on riociguat.  - Consider addition of Tyvaso as outpatient.   6. AKI on stage III CKD: Creatinine 1.78 => 3.06 in setting of shock.   - improved w/ tx of sepsis, SCr down to 1.39 today  - Continue home midodrine dose, 15 mg tid for BP support  7. OSA: Moderate OSA on sleep study. He refused CPAP. 8. Emphysema: Prior smoker.  Emphysema on CT and severe obstruction on PFTs. On 2L home oxygen.  9. Chronic PE: Diagnosed by V/Q scan.  No abnormal bleeding. He has a probable chronic RLE DVT on venous doppler this admission.  - Continue Eliquis.  10. Hiatial hernia w/ esophagitis: Has significant GERD. He follows with Dr. Benson Norway. - Continue PPI.  11. Nutcracker SMA: Compresses duodenum.  This does not appear to be causing his current symptoms.   Length of Stay: [address], PA-C  03/19/2022, 9:45 AM  Advanced Heart Failure Team Pager [phone] (M-F; 7a - 5p)  Please contact [facility] for night-coverage after hours (5p -7a ) and weekends on [email or web address]  Patient seen with PA, agree with the above note.   CVP 3 today, creatinine down to 1.39.  He is feeling better.   General: NAD Neck: No JVD, no thyromegaly or thyroid nodule.  Lungs: Decreased right base.  CV: Nondisplaced PMI.  Heart regular S1/S2, no S3/S4, no murmur.  No peripheral edema.   Abdomen: Soft, nontender, no hepatosplenomegaly, no distention.  Skin: Intact without lesions or rashes.   Neurologic: Alert and oriented x 3.  Psych: Normal affect. Extremities: No clubbing or cyanosis.  HEENT: Normal.   CVP 3, hold diuretics.   With elevated troponin as well as severe RV failure, plan LHC/RHC tomorrow (discussed risks/benefits with patient and he agrees to procedure).   RLL PNA, remains on Zosyn.   He is back on riociguat for pulmonary hypertension, consider Tyvaso as outpatient depending on outcome of RHC tomorrow.   Loralie Champagne. 03/19/2022 1:21 PM

## 2022-03-19 NOTE — TOC Initial Note (Addendum)
Transition of Care [facility]) - Initial/Assessment Note    Patient Details  Name: Joshua [name] MRN: [id number] Date of Birth: Apr 17, 1936  Transition of Care [facility]) CM/SW Contact:    Erenest Rasher, RN Phone Number: [phone] 03/19/2022, 3:43 PM  Clinical Narrative:                 HF TOC CM spoke to pt, son and dtr at bedside. Pt states he lives at home with wife. Able to ambulate without assistive devices. Pt has oxygen ([employer]), RW, cane and scale at home. Pt reports he had [employer] in the past for [facility]. Will continue to follow for dc needs.   Spoke to dtr, Harper, states pt no longer has hospital bed. She is still interested in getting him a scooter so he can be more mobile in the community. Pt likes to go to ball games, and church but unable to walk a long distance. They opted not to get wheelchair because the were interested in scooter. Insurance does not cover both wheelchair and scooter. States pt did not get Trilogy, states she is not aware of reason. Will follow up with [employer] on Trilogy device. Pt weighs daily and check blood pressure. Dtr wanted [facility] prior to him going to [facility] rehab. Requested [facility] for [facility].  [place] for Trilogy/NIV. Updated attending.   [employer] rep, Caryl Pina notified with referral. HH orders in [place].   Expected Discharge Plan: [place] Barriers to Discharge: Continued Medical Work up   Patient Goals and CMS Choice Patient states their goals for this hospitalization and ongoing recovery are:: wants to get better CMS Medicare.gov Compare Post Acute Care list provided to:: Patient Choice offered to / list presented to : Patient  Expected Discharge Plan and Services Expected Discharge Plan: [place]   Discharge Planning Services: CM Consult Post Acute Care Choice: [place] arrangements for the past 2 months: Single Family Home                                       Prior Living Arrangements/Services Living arrangements for the past 2 months: Single Family Home Lives with:: Spouse Patient language and need for interpreter reviewed:: Yes Do you feel safe going back to the place where you live?: Yes      Need for Family Participation in Patient Care: Yes (Comment) Care giver support system in place?: Yes (comment) Current home services: DME (cane, rolling walker, oxygen ([employer])) Criminal Activity/Legal Involvement Pertinent to Current Situation/Hospitalization: No - Comment as needed  Activities of Daily Living      Permission Sought/Granted Permission sought to share information with : Case Manager, Family Supports, PCP Permission granted to share information with : Yes, Verbal Permission Granted  Share Information with NAME: Leonid Manus, [place] granted to share info w Relationship: wife, daughter Chauncey Reading  Permission granted to share info w Contact Information: [phone], [phone]  Emotional Assessment Appearance:: Appears stated age Attitude/Demeanor/Rapport: Engaged Affect (typically observed): Accepting Orientation: : Oriented to Self, Oriented to Place, Oriented to  Time, Oriented to Situation   Psych Involvement: No (comment)  Admission diagnosis:  Severe sepsis ([place]) [A41.9, R65.20] Sepsis with acute renal failure and septic shock, due to unspecified organism, unspecified acute renal failure type ([place]) [A41.9, R65.21, N17.9] Patient Active Problem List   Diagnosis  Date Noted   NSTEMI (non-ST elevated myocardial infarction) ([place]) 03/19/2022   Acute kidney injury superimposed on chronic kidney disease ([place]) 03/19/2022   COPD with emphysema ([place]) 03/19/2022   Dyslipidemia    Chronic diastolic CHF (congestive heart failure) ([place])    Pulmonary embolism ([place]) 03/17/2022   Severe sepsis ([place]) 03/16/2022   Protein-calorie malnutrition, severe ([place]) 12/26/2021   Palliative care by specialist     Hypokalemia 10/05/2021   Abnormal gait 10/05/2021   Gastroesophageal reflux disease without esophagitis 10/05/2021   Hiatal hernia 10/05/2021   Hiccoughs 10/05/2021   Oxygen dependent 10/05/2021   Prediabetes 10/05/2021   Pure hypercholesterolemia 10/05/2021   CKD (chronic kidney disease) stage 2, GFR 60-89 ml/min 10/04/2021   Gout 10/04/2021   Asymmetrical sensorineural hearing loss 08/29/2021   Nasal dryness 08/29/2021   Ear stuffiness, bilateral 05/09/2021   Acute renal failure superimposed on stage 3 chronic kidney disease ([place]) 04/19/2021   Intractable nausea and vomiting 04/19/2021   Hyperkalemia 04/19/2021   Transaminitis 04/19/2021   Elevated troponin 04/19/2021   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   Acute on chronic right-sided heart failure ([place]) 04/12/2021   Acute kidney injury superimposed on CKD ([place]) 04/12/2021   OSA (obstructive sleep apnea) 12/07/2020   Aortic atherosclerosis ([place]) 12/07/2020   CHF (congestive heart failure) ([place]) 12/03/2020   Syncope and collapse 12/03/2020   Pseudophakia, both eyes 06/07/2019   Pulmonary hypertension ([place]) 09/29/2017   Benign prostatic hyperplasia with lower urinary tract symptoms 08/25/2017   Syncope, vasovagal 08/25/2017   Demand ischemia of myocardium ([place]) 08/25/2017   Arthritis 08/24/2017   Hypertension 02/16/2015   Chronic obstructive pulmonary disease, unspecified ([place]) 12/12/2014   Personal history of pulmonary embolism 10/10/2014   Chronic respiratory failure ([place]) 10/10/2014   S/P knee replacement 09/13/2014   PCP:  [name], DO Pharmacy:   [id number] [id number]: [phone]: [phone] [place] [address]Phone: [phone]: [phone]    Social Determinants of Health (SDOH) Interventions    Readmission  Risk Interventions    10/05/2021    3:15 PM  Readmission Risk Prevention Plan  Transportation Screening Complete  Medication Review ([place] Complete  PCP or Specialist appointment within 3-5 days of discharge Complete  HRI or HSchneiderComplete  SW Recovery Care/Counseling Consult Complete  Palliative Care Screening Not ABlairNot Applicable

## 2022-03-19 NOTE — Assessment & Plan Note (Addendum)
CKD stage 3a with base cr around 1,5. Had cr up to 2 in the recent past.  Hyponatremia.   Renal function today with serum cr at 1,78, K is 4,6 and serum bicarbonate at 32. Na 136  Plan to continue supportive medical therapy, avoid hypotension and nephrotoxic medications.  Hold on diuretic therapy for now.

## 2022-03-19 NOTE — Assessment & Plan Note (Addendum)
Patient on high dose statin, close follow up on liver enzymes.

## 2022-03-19 NOTE — Progress Notes (Addendum)
Progress Note   Patient: Joshua [name] GYI:[id number] DOB: [birth year] DOA: 03/16/2022     3 DOS: the patient was seen and examined on 03/19/2022   Brief hospital course: Joshua [name] was admitted to the hospital with the working diagnosis of septic shock.  86 yo male with the past medical history of diastolic heart failure, hypertension, COPD with chronic hypoxemic respiratory failure, and CKD who presented with nausea and vomiting. 24 hrs of gastrointestinal symptoms that progressed into dyspnea. Associated with fever. On his initial physical examination his blood pressure was 102/53, HR 74, RR 11 and 02 saturation 97%, lungs with rales bilaterally at bases, more right than left, heart with S1 and S2 present and rhythmic, abdomen soft and not distended, + lower extremity edema.   Na 136, K 4,6 CL 97, bicarbonate 32 glucose 117 bun 26 cr 1,78  BNP 170 High sensitive troponin 633  Lactic acid 6,8  Wbc 15, hgb 11,2 plt 156 INR 2.2 Sars COVID 29 negative   Chest radiograph with prominent pulmonary vasculature, with bilateral atelectasis  Chest Ct with diffuse centrolobular emphysema, with right lower lobe infiltrate. Severe coronary calcification. Mild mesenteric stranding at the right lower abdomen and pelvis.   EKG 75 bpm, normal axis, right bundle branch block, sinus rhythm with no significant ST segment or T wave changes.   Patient was placed on IV fluids and broad spectrum antibiotic therapy.  He developed shock and required vasopressor therapy with norepinephrine.   Troponin peaked at 8,808. Anticoagulation was continued with heparin drip with good toleration.  Echocardiogram with severe reduction in RV systolic function, LV EF 60 to 65% with no regional wall motion abnormalities.   Patient regain hemodynamic stability and was wean off norepinephrine.  Transferred to [facility] on 08/01.     Assessment and Plan: * Severe sepsis ([place]) Septic shock present on admission.  Cultures with  no growth.  Clinical presentation consistent with aspiration pneumonia, right lower lobe. (present on admission).   Wbc 5,0, patient has been afebrile.  Elevated liver enzymes due to liver shock and right sided heart failure.  AST 901 and ALT 1,260, total Bil 0.7 Liver US with no parenchymal abnormalities and no biliary duct dilatation.    Plan to continue antibiotic therapy with [place] Sellers if continue to improve, will plan to transition to oral Augmentin.    NSTEMI (non-ST elevated myocardial infarction) (HCC) Echocardiogram with no wall motion abnormalities.  Patient with no chest pain. Troponin is trending down.   Plan to continue anticoagulation therapy with heparin IV Plan for cardiac catheterization during this hospitalization.  Noted coronary calcification on CT chest.   Continue with aspirin and statin. No B blocker or ace inh due to risk of hypotension.   Chronic diastolic CHF (congestive heart failure) (HCC) Echocardiogram with preserved LV systolic function EF 60 to 65%. No wall motion abnormalities. Interventricular septum flattened in systolic. RV with severe reduction in systolic function. RVSP 49,7 mmHg (suboptimal TR jet assessment), severe dilatation RA.   Severe pulmonary hypertension, possible class 1 or 4.  Urine output 3,100 ml over last 24 hrs. Clinically euvolemic.   Continue midodrine for blood pressure support. Continue to hold on diuretic therapy for now.       Acute kidney injury superimposed on chronic kidney disease (HCC) CKD stage 3a with base cr around 1,5. Had cr up to 2 in the recent past.  Hyponatremia.   Renal function today with serum cr at 1,78, K is 4,6 and serum bicarbonate  at 32. Na 136  Plan to continue supportive medical therapy, avoid hypotension and nephrotoxic medications.  Hold on diuretic therapy for now.   COPD with emphysema ([place]) No signs of clinica exacerbation. Continue supplemental 02 per [place] to keep 02 saturation 88% or  greater.  Out of bed to chair tid with meals, Pt and Ot.   Hypertension Continue midodrine for blood pressure support. Will continue close monitoring.   Dyslipidemia Patient on high dose statin, close follow up on liver enzymes.   Pulmonary embolism (HCC) Continue anticoagulation with heparin, plan to transition to oral anticoagulation after completing cardiac workup.         Subjective: Patient is feeling better, no chest pain, no nausea or vomiting, tolerating po well. He has not been out of bed yet.   Physical Exam: Vitals:   03/19/22 0416 03/19/22 0747 03/19/22 0824 03/19/22 1133  BP: 109/63 (!) 90/55  (!) 96/56  Pulse: 76 72  64  Resp: _0 Temp: 98 F (36.7 C) 97.6 F (36.4 C)  98 F (36.7 C)  TempSrc: Oral Oral  Oral  SpO2: 98% 100% 96% 97%  Weight:       Neurology awake and alert ENT with mild pallor Cardiovascular with S1 and S2 present and rhythmic with no gallops, rubs or murmurs Respiratory with scattered raled but not wheezing Abdomen not distended No lower extremity edema  Data Reviewed:    Family Communication:  I spoke with patient's family at the bedside, we talked in detail about patient's condition, plan of care and prognosis and all questions were addressed.   Disposition: Status is: Inpatient Remains inpatient appropriate because: heart failure and recovering septic shock   Planned Discharge Destination: Home      Author: Tawni Millers, MD 03/19/2022 11:36 AM  For on call review [email or web address].

## 2022-03-19 NOTE — Assessment & Plan Note (Signed)
No signs of clinica exacerbation. Continue supplemental 02 per [place] to keep 02 saturation 88% or greater.  Out of bed to chair tid with meals, Pt and Ot.

## 2022-03-19 NOTE — Assessment & Plan Note (Signed)
Continue anticoagulation with heparin, plan to transition to oral anticoagulation after completing cardiac workup.

## 2022-03-19 NOTE — Assessment & Plan Note (Addendum)
Echocardiogram with no wall motion abnormalities.  Patient with no chest pain. Troponin is trending down.   Plan to continue anticoagulation therapy with heparin IV Plan for cardiac catheterization during this hospitalization.  Noted coronary calcification on CT chest.   Continue with aspirin and statin. No B blocker or ace inh due to risk of hypotension.

## 2022-03-19 NOTE — TOC CM/SW Note (Cosign Needed)
Joshua [name] continues to exhibit signs of chronic hypoxemic respiratory failure due to COPD.  The use of the NIV will treat patient's elevated PCO2 (60.5 with an elevated bicarbonate of 35.9)  and can reduce risk of exacerbations and future hospitalizations when used at night and during the day.  All alternate devices [phone] and [id number]) have been considered and ruled out as volume requirements are not met by BiLevel devices.  An NIV with volume-targeted pressure support is necessary to prevent patient from life-threatening harm.  Interruption or failure to provide NIV would quickly lead to exacerbation of the patient's condition, hospital re-admission, and likely harm to the patient. Continued use is preferred.  Patient is able to protect their airways and clear secretions on their own.

## 2022-03-19 NOTE — Progress Notes (Signed)
ANTICOAGULATION CONSULT NOTE  Pharmacy Consult for Heparin Indication: pulmonary embolus  Allergies  Allergen Reactions   Other Swelling    Beer- Swelling    Sunflower Oil Swelling   Sulfa Antibiotics Rash    Patient Measurements: Weight: 82.3 kg (181 lb 8 oz) Heparin Dosing Weight: 81.3 kg  Vital Signs: Temp: 97.6 F (36.4 C) (08/01 0747) Temp Source: Oral (08/01 0747) BP: 90/55 (08/01 0747) Pulse Rate: 72 (08/01 0747)  Labs: Recent Labs    03/16/22 0914 03/16/22 1140 03/16/22 2133 03/17/22 0710 03/17/22 1137 03/18/22 0337 03/18/22 1839 03/19/22 0403  HGB 11.2*  --   --  10.3*  --  10.9*  --  10.6*  HCT 35.4*  --   --  31.3*  --  33.1*  --  33.2*  PLT 156  --   --  139*  --  123*  --  150  APTT 36  --  98* 83*  --  111* 102* 70*  LABPROT 24.5*  --   --   --   --   --   --   --   INR 2.2*  --   --   --   --   --   --   --   HEPARINUNFRC  --    < > >1.10* >1.10*  --  >1.10*  --  >1.10*  CREATININE 3.06*  --   --  2.22*  --  1.53*  --  1.39*  TROPONINIHS 633*   < > 8,808* 6,777* 5,875*  --   --   --    < > = values in this interval not displayed.     Estimated Creatinine Clearance: 43.4 mL/min (A) (by C-G formula based on SCr of 1.39 mg/dL (H)).   Assessment: 43 yom with a history of PE on eliquis, COPD, HF, sleep apnea, CKD, GERD. Patient is presenting with sudden onset N/V and hiccups which progressed to SOB, fevers, elevated lactates, and AKI/ALF. Heparin per pharmacy consult placed for pulmonary embolus.  Patient is on apixaban prior to arrival. Last dose 7/28pm. Will require aPTT monitoring due to likely falsely high anti-Xa level secondary to DOAC use.    LE doppler on 7/29 found age indeterminate DVT in R gastrocnemius veins.  aPTT therapeutic at 70 sec after adjustment last night. CBC stable overnight, no overt bleeding noted. Heparin level still elevated >1.1 with recent apixaban.   Goal of Therapy:  Heparin level 0.3-0.7 units/ml aPTT 66-102  seconds Monitor platelets by anticoagulation protocol: Yes   Plan:  Continue heparin infusion at 1100 units/hr  F/u AM labs  Erin Hearing PharmD., BCPS Clinical Pharmacist 03/19/2022 8:34 AM

## 2022-03-20 DIAGNOSIS — I214 Non-ST elevation (NSTEMI) myocardial infarction: Secondary | ICD-10-CM | POA: Diagnosis not present

## 2022-03-20 DIAGNOSIS — N189 Chronic kidney disease, unspecified: Secondary | ICD-10-CM | POA: Diagnosis not present

## 2022-03-20 DIAGNOSIS — N179 Acute kidney failure, unspecified: Secondary | ICD-10-CM | POA: Diagnosis not present

## 2022-03-20 DIAGNOSIS — I5032 Chronic diastolic (congestive) heart failure: Secondary | ICD-10-CM | POA: Diagnosis not present

## 2022-03-20 DIAGNOSIS — A419 Sepsis, unspecified organism: Secondary | ICD-10-CM | POA: Diagnosis not present

## 2022-03-20 DIAGNOSIS — R6520 Severe sepsis without septic shock: Secondary | ICD-10-CM | POA: Diagnosis not present

## 2022-03-20 DIAGNOSIS — E785 Hyperlipidemia, unspecified: Secondary | ICD-10-CM

## 2022-03-20 LAB — COMPREHENSIVE METABOLIC PANEL
ALT: 1011 U/L — ABNORMAL HIGH (ref 0–44)
AST: 523 U/L — ABNORMAL HIGH (ref 15–41)
Albumin: 2.6 g/dL — ABNORMAL LOW (ref 3.5–5.0)
Alkaline Phosphatase: 82 U/L (ref 38–126)
Anion gap: 3 — ABNORMAL LOW (ref 5–15)
BUN: 16 mg/dL (ref 8–23)
CO2: 30 mmol/L (ref 22–32)
Calcium: 9.2 mg/dL (ref 8.9–10.3)
Chloride: 106 mmol/L (ref 98–111)
Creatinine, Ser: 1.14 mg/dL (ref 0.61–1.24)
GFR, Estimated: >60 mL/min (ref >60)
Glucose, Bld: 84 mg/dL (ref 70–99)
Potassium: 4.1 mmol/L (ref 3.5–5.1)
Sodium: 139 mmol/L (ref 135–145)
Total Bilirubin: 0.7 mg/dL (ref 0.3–1.2)
Total Protein: 6 g/dL — ABNORMAL LOW (ref 6.5–8.1)

## 2022-03-20 LAB — LIPID PANEL
Cholesterol: 76 mg/dL (ref 0–200)
HDL: 28 mg/dL — ABNORMAL LOW (ref >40)
LDL Cholesterol: 41 mg/dL (ref 0–99)
Total CHOL/HDL Ratio: 2.7 RATIO
Triglycerides: 35 mg/dL (ref <150)
VLDL: 7 mg/dL (ref 0–40)

## 2022-03-20 LAB — HEPARIN LEVEL (UNFRACTIONATED): Heparin Unfractionated: 0.93 IU/mL — ABNORMAL HIGH (ref 0.30–0.70)

## 2022-03-20 LAB — GLUCOSE, CAPILLARY: Glucose-Capillary: 74 mg/dL (ref 70–99)

## 2022-03-20 LAB — MAGNESIUM: Magnesium: 1.6 mg/dL — ABNORMAL LOW (ref 1.7–2.4)

## 2022-03-20 LAB — APTT: aPTT: 84 seconds — ABNORMAL HIGH (ref 24–36)

## 2022-03-20 LAB — PHOSPHORUS: Phosphorus: 2.2 mg/dL — ABNORMAL LOW (ref 2.5–4.6)

## 2022-03-20 MED ORDER — SODIUM CHLORIDE 0.9% FLUSH: 3.0000 mL | INTRAVENOUS | Status: DC | PRN

## 2022-03-20 MED ORDER — SODIUM CHLORIDE 0.9 % IV SOLN: 250.0000 mL | INTRAVENOUS | Status: DC | PRN

## 2022-03-20 MED ORDER — AMOXICILLIN-POT CLAVULANATE 875-125 MG PO TABS
1.0000 | ORAL_TABLET | Freq: Two times a day (BID) | ORAL | Status: DC
  Administered 2022-03-20 – 2022-03-21 (×3): 1 via ORAL
  Filled 2022-03-20 (×3): qty 1

## 2022-03-20 MED ORDER — MAGNESIUM SULFATE 2 GM/50ML IV SOLN
2.0000 g | Freq: Once | INTRAVENOUS | Status: AC
Start: 2022-03-20 — End: 2022-03-20
  Administered 2022-03-20: 2 g via INTRAVENOUS
  Filled 2022-03-20: qty 50

## 2022-03-20 MED ORDER — ASPIRIN 81 MG PO CHEW
81.0000 mg | CHEWABLE_TABLET | ORAL | Status: AC
  Administered 2022-03-21: 81 mg via ORAL
  Filled 2022-03-20: qty 1

## 2022-03-20 MED ORDER — SODIUM CHLORIDE 0.9 % IV SOLN
INTRAVENOUS | Status: DC
Start: 2022-03-21 — End: 2022-03-21

## 2022-03-20 NOTE — Care Management Important Message (Signed)
Important Message  Patient Details  Name: Joshua [name] MRN: [id number] Date of Birth: [birth year]   Medicare Important Message Given:  Yes     [name] 03/20/2022, 11:20 AM

## 2022-03-20 NOTE — H&P (View-Only) (Signed)
Advanced Heart Failure Rounding Note  PCP-Cardiologist: Sherren Mocha, MD   Subjective:    Admitted w/ septic shock due to aspiration pneumonia vs colitis  w/ significantly elevated HS trop.   Much improved w/ abx. Has been switched to Unasyn for suspected aspiration PNA.   BP now stable off NE support. Back on home midodrine regimen. AKI resolved. Transferred out of ICU.   SCr 2.06>>2.22>>1.53>>1.39>>1.14 today. CVP 4   Continues w/ intermittent CP/indigestion. Currently CP free at the moment and on IV heparin.   Planning [facility] tomorrow.   Echo 7/31: RV severely reduced, severely enlarged, RVSP 50, LVEF 60-65%, no RWMA, mild TR, trivial MR.    Objective:   Weight Range: 84 kg Body mass index is 25.12 kg/m.   Vital Signs:   Temp:  [97.1 F (36.2 C)-98.1 F (36.7 C)] 97.5 F (36.4 C) (08/02 0746) Pulse Rate:  [64-94] 94 (08/02 0746) Resp:  [14-21] 19 (08/02 0746) BP: (85-118)/(48-73) 98/58 (08/02 0746) SpO2:  [93 %-100 %] 100 % (08/02 0746) Weight:  [84 kg] 84 kg (08/02 0405) Last BM Date : 03/17/22  Weight change: Filed Weights   03/16/22 1400 03/19/22 0414 03/20/22 0405  Weight: 82.3 kg 82.3 kg 84 kg    Intake/Output:   Intake/Output Summary (Last 24 hours) at 03/20/2022 1034 Last data filed at 03/20/2022 0700 Gross per 24 hour  Intake 678.39 ml  Output 1850 ml  Net -1171.61 ml      Physical Exam   CVP 4  General:  Well appearing. No respiratory difficulty HEENT: normal Neck: supple. no JVD. Carotids 2+ bilat; no bruits. No lymphadenopathy or thyromegaly appreciated. Cor: PMI nondisplaced. Regular rate & rhythm. No rubs, gallops or murmurs. Lungs: clear Abdomen: soft, nontender, nondistended. No hepatosplenomegaly. No bruits or masses. Good bowel sounds. Extremities: no cyanosis, clubbing, rash, edema Neuro: alert & oriented x 3, cranial nerves grossly intact. moves all 4 extremities w/o difficulty. Affect pleasant.   Telemetry   NSR 80s    EKG    NSR w/ RBBB. No change from prior.   Labs    CBC Recent Labs    03/18/22 0337 03/19/22 0403  WBC 6.6 5.0  HGB 10.9* 10.6*  HCT 33.1* 33.2*  MCV 86.9 87.4  PLT 123* 373   Basic Metabolic Panel Recent Labs    03/19/22 0403 03/20/22 0425  NA 138 139  K 3.8 4.1  CL 102 106  CO2 28 30  GLUCOSE 96 84  BUN 17 16  CREATININE 1.39* 1.14  CALCIUM 8.8* 9.2  MG 1.8 1.6*  PHOS 2.4* 2.2*   Liver Function Tests Recent Labs    03/19/22 0403 03/20/22 0425  AST 901* 523*  ALT 1,260* 1,011*  ALKPHOS 81 82  BILITOT 0.7 0.7  PROT 6.6 6.0*  ALBUMIN 2.5* 2.6*   No results for input(s): "LIPASE", "AMYLASE" in the last 72 hours.  Cardiac Enzymes No results for input(s): "CKTOTAL", "CKMB", "CKMBINDEX", "TROPONINI" in the last 72 hours.  BNP: BNP (last 3 results) Recent Labs    12/27/21 1001 03/15/22 0952 03/16/22 0914  BNP 83.7 170.1* 380.3*    ProBNP (last 3 results) No results for input(s): "PROBNP" in the last 8760 hours.   D-Dimer No results for input(s): "DDIMER" in the last 72 hours. Hemoglobin A1C No results for input(s): "HGBA1C" in the last 72 hours.  Fasting Lipid Panel Recent Labs    03/20/22 0425  CHOL 76  HDL 28*  LDLCALC 41  TRIG 35  CHOLHDL 2.7   Thyroid Function Tests No results for input(s): "TSH", "T4TOTAL", "T3FREE", "THYROIDAB" in the last 72 hours.  Invalid input(s): "FREET3"  Other results:   Imaging    No results found.   Medications:     Scheduled Medications:  arformoterol  15 mcg Nebulization BID   aspirin  81 mg Oral Daily   aspirin  81 mg Oral Pre-Cath   atorvastatin  80 mg Oral Daily   bisacodyl  10 mg Rectal QHS   Chlorhexidine Gluconate Cloth  6 each Topical Q0600   finasteride  5 mg Oral Daily   gabapentin  300 mg Oral TID   metoCLOPramide  5 mg Oral TID AC   midodrine  15 mg Oral TID WC   pantoprazole  40 mg Oral Daily   revefenacin  175 mcg Nebulization Daily   Riociguat  2 mg Oral TID    sodium chloride flush  10-40 mL Intracatheter Q12H   sodium chloride flush  3 mL Intravenous Q12H    Infusions:  sodium chloride 10 mL/hr at 03/17/22 0251   sodium chloride     [START ON 03/21/2022] sodium chloride     ampicillin-sulbactam (UNASYN) IV 1.5 g (03/20/22 0905)   heparin 1,100 Units/hr (03/20/22 0542)   promethazine (PHENERGAN) injection (IM or IVPB)      PRN Medications: sodium chloride, docusate sodium, polyethylene glycol, promethazine (PHENERGAN) injection (IM or IVPB), sodium chloride flush, sodium chloride flush    Patient Profile   86 y/o male w/ chronic HFpEF/RV failure, PH felt multifactorial from severe emphysema, OSA and chronic PE, Stage III CKD and nutcracker duodenal obstruction admitted w/ shock, felt to be primarily septic shock. AHF team consulted for elevated Hs trop  Assessment/Plan   1. Shock: Concern for septic shock with presentation with nausea/vomiting/fever/abdominal pain and possible RLL aspiration PNA + possible colitis. PCT 12.6 - Improving w/ abx. Switched to Unasyn. Consider switch to Augmentin give salt load in Unasyn. Will d/w primary team  - now off NE w/ stable BP/MAPs  - UCx and BCx NGTD  - BP stable on midodrine 15 mg tid (home dose)   2. Elevated troponin: HS-TnI up to 8818, no chest pain.  However, nausea/vomiting could be an atypical presentation of ischemia. ECG unchanged.  Could be demand ischemia in the setting of septic shock, but TnI is high for this.  Cannot rule out true ACS. Per family report, he did endorse chest pain/indigestion prior to admit  - Now that AKI has improved, SCr 2.06>>2.22>>1.53>>1.39>>1.14 today, will plan [facility] tomorrow  - Would continue heparin gtt, + ASA 81 and atorvastatin 80 mg daily.  - Echo stable w/ chronic RV failure, LVEF normal 60-65%, no RWMA  3. ID: Suspect aspiration PNA from CT chest (RLL infiltrate), also concern for possible colitis based on abdominal CT. Suspect septic shock as above. Now  improved   - Continue abx per IM  4. Chronic HFpEF/RV failure: Echo (2/22) with EF 55-60%, IV septum flattened, severe RV enlargement, severely decreased RV function, PASP 57 mmHg.  He has severe RV failure.  Chronically NYHA III. He appears at least mildly volume overloaded on exam but is not short of breath at rest. Received IVF resuscitation for treatment of sepsis  - Echo this admit>>RV severely reduced, severely enlarged, RVSP 50, LVEF 60-65%, no RWMA, mild TR, trivial MR. - AKI resolved but CVP low. Hold diuretics again today - Hold dapagliflozin again today, possible restart tomorrow  - RHC tomorrow  5.  Pulmonary HTN: PAH noted on 4/22 RHC with PVR 6.1 WU.  This appears to be multifactorial with OSA, severe emphysema, and a suspected chronic PE involving the right middle lobe (group 3 and group 4 PH). Given the suspected mixed etiology with only 1 area of chronic thromboembolism (right middle lobe) as well as age, do not think that pulmonary thromboendarterectomy would be indicated.  Rheumatologic serologic workup was negative.  PFTs showed severe obstruction and moderate restriction, suggesting significant COPD.  - He is back on riociguat.  - Consider addition of Tyvaso pending results of RHC tomorrow  6. AKI on stage III CKD: Creatinine 1.78 => 3.06 in setting of shock.   - improved w/ tx of sepsis, SCr down to 1.14 today  - Continue home midodrine dose, 15 mg tid for BP support  7. OSA: Moderate OSA on sleep study. He refused CPAP. 8. Emphysema: Prior smoker.  Emphysema on CT and severe obstruction on PFTs. On 2L home oxygen.  9. Chronic PE: Diagnosed by V/Q scan.  No abnormal bleeding. He has a probable chronic RLE DVT on venous doppler this admission.  - Continue Eliquis.  10. Hiatial hernia w/ esophagitis: Has significant GERD. He follows with Dr. Benson Norway. - Continue PPI.  11. Nutcracker SMA: Compresses duodenum.  This does not appear to be causing his current symptoms.   Length of  Stay: [address] PA-C  03/20/2022, 10:34 AM  Advanced Heart Failure Team Pager [phone] (M-F; 7a - 5p)  Please contact [facility] for night-coverage after hours (5p -7a ) and weekends on [email or web address]  Patient seen with PA, agree with the above note.   Creatinine down to 1.14 with CVP 4.  No dyspnea.   General: NAD Neck: No JVD, no thyromegaly or thyroid nodule.  Lungs: Decreased BS right base.  CV: Nondisplaced PMI.  Heart regular S1/S2, no S3/S4, no murmur.  No peripheral edema.   Abdomen: Soft, nontender, no hepatosplenomegaly, no distention.  Skin: Intact without lesions or rashes.  Neurologic: Alert and oriented x 3.  Psych: Normal affect. Extremities: No clubbing or cyanosis.  HEENT: Normal.   Aspiration PNA RLL, would recommend transition to Augmentin to avoid sodium load with Unasyn.   Volume status ok today, will not restart diuretic yet.  Should be ok to start [place] tomorrow.   Plan RHC/LHC tomorrow to check filling pressure and PA pressure, also to assess for CAD in setting of significant TnI elevation/NSTEMI (?ACS vs demand ischemia).  We discussed risks/benefits and he agrees to procedure.   Loralie Champagne. 03/20/2022 11:21 AM

## 2022-03-20 NOTE — Progress Notes (Signed)
ANTICOAGULATION CONSULT NOTE  Pharmacy Consult for Heparin Indication: Hx pulmonary embolus, DVT, new ACS  Allergies  Allergen Reactions   Other Swelling    Beer- Swelling    Sunflower Oil Swelling   Sulfa Antibiotics Rash    Patient Measurements: Weight: 84 kg (185 lb 3 oz) Heparin Dosing Weight: 81.3 kg  Vital Signs: Temp: 97.5 F (36.4 C) (08/02 0746) Temp Source: Oral (08/02 0746) BP: 98/58 (08/02 0746) Pulse Rate: 94 (08/02 0746)  Labs: Recent Labs    03/18/22 0337 03/18/22 1839 03/19/22 0403 03/20/22 0425  HGB 10.9*  --  10.6*  --   HCT 33.1*  --  33.2*  --   PLT 123*  --  150  --   APTT 111* 102* 70* 84*  HEPARINUNFRC >1.10*  --  >1.10* 0.93*  CREATININE 1.53*  --  1.39* 1.14     Estimated Creatinine Clearance: 52.9 mL/min (by C-G formula based on SCr of 1.14 mg/dL).   Assessment: 61 yom with a history of PE on eliquis, COPD, HF, sleep apnea, CKD, GERD. Patient is presenting with sudden onset N/V and hiccups which progressed to SOB, fevers, elevated lactates, and AKI/ALF. Heparin per pharmacy consult placed for pulmonary embolus.  Patient is on apixaban prior to arrival. Last dose 7/28pm. Will require aPTT monitoring due to likely falsely high anti-Xa level secondary to DOAC use.    LE doppler on 7/29 found age indeterminate DVT in R gastrocnemius veins.  New chest pains r/o ACS plan cath in am  aPTT therapeutic at 83 sec on heparin drip rate 1100 uts/hr   CBC stable overnight, no overt bleeding noted. Heparin level still elevated  with recent apixaban.   Goal of Therapy:  Heparin level 0.3-0.7 units/ml aPTT 66-102 seconds Monitor platelets by anticoagulation protocol: Yes   Plan:  Continue heparin infusion at 1100 units/hr    [facility] Pharm.D. CPP, BCPS Clinical Pharmacist [phone] 03/20/2022 12:54 PM

## 2022-03-20 NOTE — Progress Notes (Signed)
OT Cancellation Note  Patient Details Name: Joshua [name] MRN: [id number] DOB: [birth year]   Cancelled Treatment:    Reason Eval/Treat Not Completed: Patient's level of consciousness. Pt sleeping upon therapy arrival. Therapist attempted to wake patient up although was unsuccessful. Per chart, PT was unable to see pt to complete evaluation as well as he received Phenergan this AM and was sleeping also. Will re-attempt OT evaluation when pt is awake and able to participate.   Ailene Ravel, OTR/L,CBIS  Supplemental OT - [facility] and [facility] Dress  03/20/2022, 3:00 PM

## 2022-03-20 NOTE — Progress Notes (Addendum)
Advanced Heart Failure Rounding Note  PCP-Cardiologist: Sherren Mocha, MD   Subjective:    Admitted w/ septic shock due to aspiration pneumonia vs colitis  w/ significantly elevated HS trop.   Much improved w/ abx. Has been switched to Unasyn for suspected aspiration PNA.   BP now stable off NE support. Back on home midodrine regimen. AKI resolved. Transferred out of ICU.   SCr 2.06>>2.22>>1.53>>1.39>>1.14 today. CVP 4   Continues w/ intermittent CP/indigestion. Currently CP free at the moment and on IV heparin.   Planning [facility] tomorrow.   Echo 7/31: RV severely reduced, severely enlarged, RVSP 50, LVEF 60-65%, no RWMA, mild TR, trivial MR.    Objective:   Weight Range: 84 kg Body mass index is 25.12 kg/m.   Vital Signs:   Temp:  [97.1 F (36.2 C)-98.1 F (36.7 C)] 97.5 F (36.4 C) (08/02 0746) Pulse Rate:  [64-94] 94 (08/02 0746) Resp:  [14-21] 19 (08/02 0746) BP: (85-118)/(48-73) 98/58 (08/02 0746) SpO2:  [93 %-100 %] 100 % (08/02 0746) Weight:  [84 kg] 84 kg (08/02 0405) Last BM Date : 03/17/22  Weight change: Filed Weights   03/16/22 1400 03/19/22 0414 03/20/22 0405  Weight: 82.3 kg 82.3 kg 84 kg    Intake/Output:   Intake/Output Summary (Last 24 hours) at 03/20/2022 1034 Last data filed at 03/20/2022 0700 Gross per 24 hour  Intake 678.39 ml  Output 1850 ml  Net -1171.61 ml      Physical Exam   CVP 4  General:  Well appearing. No respiratory difficulty HEENT: normal Neck: supple. no JVD. Carotids 2+ bilat; no bruits. No lymphadenopathy or thyromegaly appreciated. Cor: PMI nondisplaced. Regular rate & rhythm. No rubs, gallops or murmurs. Lungs: clear Abdomen: soft, nontender, nondistended. No hepatosplenomegaly. No bruits or masses. Good bowel sounds. Extremities: no cyanosis, clubbing, rash, edema Neuro: alert & oriented x 3, cranial nerves grossly intact. moves all 4 extremities w/o difficulty. Affect pleasant.   Telemetry   NSR 80s    EKG    NSR w/ RBBB. No change from prior.   Labs    CBC Recent Labs    03/18/22 0337 03/19/22 0403  WBC 6.6 5.0  HGB 10.9* 10.6*  HCT 33.1* 33.2*  MCV 86.9 87.4  PLT 123* 539   Basic Metabolic Panel Recent Labs    03/19/22 0403 03/20/22 0425  NA 138 139  K 3.8 4.1  CL 102 106  CO2 28 30  GLUCOSE 96 84  BUN 17 16  CREATININE 1.39* 1.14  CALCIUM 8.8* 9.2  MG 1.8 1.6*  PHOS 2.4* 2.2*   Liver Function Tests Recent Labs    03/19/22 0403 03/20/22 0425  AST 901* 523*  ALT 1,260* 1,011*  ALKPHOS 81 82  BILITOT 0.7 0.7  PROT 6.6 6.0*  ALBUMIN 2.5* 2.6*   No results for input(s): "LIPASE", "AMYLASE" in the last 72 hours.  Cardiac Enzymes No results for input(s): "CKTOTAL", "CKMB", "CKMBINDEX", "TROPONINI" in the last 72 hours.  BNP: BNP (last 3 results) Recent Labs    12/27/21 1001 03/15/22 0952 03/16/22 0914  BNP 83.7 170.1* 380.3*    ProBNP (last 3 results) No results for input(s): "PROBNP" in the last 8760 hours.   D-Dimer No results for input(s): "DDIMER" in the last 72 hours. Hemoglobin A1C No results for input(s): "HGBA1C" in the last 72 hours.  Fasting Lipid Panel Recent Labs    03/20/22 0425  CHOL 76  HDL 28*  LDLCALC 41  TRIG 35  CHOLHDL 2.7   Thyroid Function Tests No results for input(s): "TSH", "T4TOTAL", "T3FREE", "THYROIDAB" in the last 72 hours.  Invalid input(s): "FREET3"  Other results:   Imaging    No results found.   Medications:     Scheduled Medications:  arformoterol  15 mcg Nebulization BID   aspirin  81 mg Oral Daily   aspirin  81 mg Oral Pre-Cath   atorvastatin  80 mg Oral Daily   bisacodyl  10 mg Rectal QHS   Chlorhexidine Gluconate Cloth  6 each Topical Q0600   finasteride  5 mg Oral Daily   gabapentin  300 mg Oral TID   metoCLOPramide  5 mg Oral TID AC   midodrine  15 mg Oral TID WC   pantoprazole  40 mg Oral Daily   revefenacin  175 mcg Nebulization Daily   Riociguat  2 mg Oral TID    sodium chloride flush  10-40 mL Intracatheter Q12H   sodium chloride flush  3 mL Intravenous Q12H    Infusions:  sodium chloride 10 mL/hr at 03/17/22 0251   sodium chloride     [START ON 03/21/2022] sodium chloride     ampicillin-sulbactam (UNASYN) IV 1.5 g (03/20/22 0905)   heparin 1,100 Units/hr (03/20/22 0542)   promethazine (PHENERGAN) injection (IM or IVPB)      PRN Medications: sodium chloride, docusate sodium, polyethylene glycol, promethazine (PHENERGAN) injection (IM or IVPB), sodium chloride flush, sodium chloride flush    Patient Profile   86 y/o male w/ chronic HFpEF/RV failure, PH felt multifactorial from severe emphysema, OSA and chronic PE, Stage III CKD and nutcracker duodenal obstruction admitted w/ shock, felt to be primarily septic shock. AHF team consulted for elevated Hs trop  Assessment/Plan   1. Shock: Concern for septic shock with presentation with nausea/vomiting/fever/abdominal pain and possible RLL aspiration PNA + possible colitis. PCT 12.6 - Improving w/ abx. Switched to Unasyn. Consider switch to Augmentin give salt load in Unasyn. Will d/w primary team  - now off NE w/ stable BP/MAPs  - UCx and BCx NGTD  - BP stable on midodrine 15 mg tid (home dose)   2. Elevated troponin: HS-TnI up to 8818, no chest pain.  However, nausea/vomiting could be an atypical presentation of ischemia. ECG unchanged.  Could be demand ischemia in the setting of septic shock, but TnI is high for this.  Cannot rule out true ACS. Per family report, he did endorse chest pain/indigestion prior to admit  - Now that AKI has improved, SCr 2.06>>2.22>>1.53>>1.39>>1.14 today, will plan [facility] tomorrow  - Would continue heparin gtt, + ASA 81 and atorvastatin 80 mg daily.  - Echo stable w/ chronic RV failure, LVEF normal 60-65%, no RWMA  3. ID: Suspect aspiration PNA from CT chest (RLL infiltrate), also concern for possible colitis based on abdominal CT. Suspect septic shock as above. Now  improved   - Continue abx per IM  4. Chronic HFpEF/RV failure: Echo (2/22) with EF 55-60%, IV septum flattened, severe RV enlargement, severely decreased RV function, PASP 57 mmHg.  He has severe RV failure.  Chronically NYHA III. He appears at least mildly volume overloaded on exam but is not short of breath at rest. Received IVF resuscitation for treatment of sepsis  - Echo this admit>>RV severely reduced, severely enlarged, RVSP 50, LVEF 60-65%, no RWMA, mild TR, trivial MR. - AKI resolved but CVP low. Hold diuretics again today - Hold dapagliflozin again today, possible restart tomorrow  - RHC tomorrow  5.   Pulmonary HTN: PAH noted on 4/22 RHC with PVR 6.1 WU.  This appears to be multifactorial with OSA, severe emphysema, and a suspected chronic PE involving the right middle lobe (group 3 and group 4 PH). Given the suspected mixed etiology with only 1 area of chronic thromboembolism (right middle lobe) as well as age, do not think that pulmonary thromboendarterectomy would be indicated.  Rheumatologic serologic workup was negative.  PFTs showed severe obstruction and moderate restriction, suggesting significant COPD.  - He is back on riociguat.  - Consider addition of Tyvaso pending results of RHC tomorrow  6. AKI on stage III CKD: Creatinine 1.78 => 3.06 in setting of shock.   - improved w/ tx of sepsis, SCr down to 1.14 today  - Continue home midodrine dose, 15 mg tid for BP support  7. OSA: Moderate OSA on sleep study. He refused CPAP. 8. Emphysema: Prior smoker.  Emphysema on CT and severe obstruction on PFTs. On 2L home oxygen.  9. Chronic PE: Diagnosed by V/Q scan.  No abnormal bleeding. He has a probable chronic RLE DVT on venous doppler this admission.  - Continue Eliquis.  10. Hiatial hernia w/ esophagitis: Has significant GERD. He follows with Dr. Benson [place]. - Continue PPI.  11. Nutcracker SMA: Compresses duodenum.  This does not appear to be causing his current symptoms.   Length of  Stay: [address] PA-C  03/20/2022, 10:34 AM  Advanced Heart Failure Team Pager [phone] (M-F; 7a - 5p)  Please contact [facility] for night-coverage after hours (5p -7a ) and weekends on [email or web address]  Patient seen with PA, agree with the above note.   Creatinine down to 1.14 with CVP 4.  No dyspnea.   General: NAD Neck: No JVD, no thyromegaly or thyroid nodule.  Lungs: Decreased BS right base.  CV: Nondisplaced PMI.  Heart regular S1/S2, no S3/S4, no murmur.  No peripheral edema.   Abdomen: Soft, nontender, no hepatosplenomegaly, no distention.  Skin: Intact without lesions or rashes.  Neurologic: Alert and oriented x 3.  Psych: Normal affect. Extremities: No clubbing or cyanosis.  HEENT: Normal.   Aspiration PNA RLL, would recommend transition to Augmentin to avoid sodium load with Unasyn.   Volume status ok today, will not restart diuretic yet.  Should be ok to start [place] tomorrow.   Plan RHC/LHC tomorrow to check filling pressure and PA pressure, also to assess for CAD in setting of significant TnI elevation/NSTEMI (?ACS vs demand ischemia).  We discussed risks/benefits and he agrees to procedure.   Loralie Champagne. 03/20/2022 11:21 AM

## 2022-03-20 NOTE — Progress Notes (Addendum)
Progress Note   Patient: Joshua [name] QQV:[id number] DOB: 01-19-1936 DOA: 03/16/2022     4 DOS: the patient was seen and examined on 03/20/2022   Brief hospital course: Mr. Cothern was admitted to the hospital with the working diagnosis of septic shock.  86 yo male with the past medical history of diastolic heart failure, hypertension, COPD with chronic hypoxemic respiratory failure, and CKD who presented with nausea and vomiting. 24 hrs of gastrointestinal symptoms that progressed into dyspnea. Associated with fever. On his initial physical examination his blood pressure was 102/53, HR 74, RR 11 and 02 saturation 97%, lungs with rales bilaterally at bases, more right than left, heart with S1 and S2 present and rhythmic, abdomen soft and not distended, + lower extremity edema.   Na 136, K 4,6 CL 97, bicarbonate 32 glucose 117 bun 26 cr 1,78  BNP 170 High sensitive troponin 633  Lactic acid 6,8  Wbc 15, hgb 11,2 plt 156 INR 2.2 Sars COVID 29 negative   Chest radiograph with prominent pulmonary vasculature, with bilateral atelectasis  Chest Ct with diffuse centrolobular emphysema, with right lower lobe infiltrate. Severe coronary calcification. Mild mesenteric stranding at the right lower abdomen and pelvis.   EKG 75 bpm, normal axis, right bundle branch block, sinus rhythm with no significant ST segment or T wave changes.   Patient was placed on IV fluids and broad spectrum antibiotic therapy.  He developed shock and required vasopressor therapy with norepinephrine.   Troponin peaked at 8,808. Anticoagulation was continued with heparin drip with good toleration.  Echocardiogram with severe reduction in RV systolic function, LV EF 60 to 65% with no regional wall motion abnormalities.   Patient regain hemodynamic stability and was wean off norepinephrine.  Transferred to [facility] on 08/01.   08/03 changed antibiotic therapy to oral Augmentin.Plan for cardiac catheterization in am, right and  left heart.   Assessment and Plan: * Severe sepsis ([place]) Septic shock present on admission.  Cultures with no growth.  Clinical presentation consistent with aspiration pneumonia, right lower lobe. (present on admission).   Elevated liver enzymes due to liver shock and right sided heart failure.  AST 523 and ALT 1,011, total Bil 0.7 Liver US with no parenchymal abnormalities and no biliary duct dilatation.    Clinically improving, sepsis is resolving.  Change antibiotic therapy to Augmentin with plan to complete total of 8 days.   NSTEMI (non-ST elevated myocardial infarction) (HCC) Echocardiogram with no wall motion abnormalities.  Patient with no chest pain. Troponin is trending down.   Plan to continue anticoagulation therapy with heparin IV Noted coronary calcification on CT chest.   Continue with aspirin and statin. No B blocker or ace inh due to risk of hypotension.  Plan for coronary angiography tomorrow.   Chronic diastolic CHF (congestive heart failure) (HCC) Echocardiogram with preserved LV systolic function EF 60 to 65%. No wall motion abnormalities. Interventricular septum flattened in systolic. RV with severe reduction in systolic function. RVSP 49,7 mmHg (suboptimal TR jet assessment), severe dilatation RA.   Severe pulmonary hypertension, possible class 1 or 4.  Urine output 1,850 ml over last 24 hrs. Clinically euvolemic.   Continue midodrine for blood pressure support. Holding diuretic therapy for now.       Acute kidney injury superimposed on chronic kidney disease (HCC) CKD stage 3a with base cr around 1,4.  Had cr up to 2 in the recent past.  Hyponatremia. Hypomagnesemia   Renal function with serum cr at 1,14 with  K at 4,1 and serum bicarbonate at 30, Mg 1,6   Plan to continue supportive medical therapy, avoid hypotension and nephrotoxic medications.  Continue holding on diuretic therapy for now.  Add 2 g Mag   COPD with emphysema ([place]) No signs  of clinica exacerbation. Continue supplemental 02 per [place] to keep 02 saturation 88% or greater.  Out of bed to chair tid with meals, Pt and Ot.   Hypertension Continue midodrine for blood pressure support. Will continue close monitoring.   Dyslipidemia Patient on high dose statin, close follow up on liver enzymes.   Pulmonary embolism (HCC) Continue anticoagulation with heparin, plan to transition to oral anticoagulation after completing cardiac workup.         Subjective: Patient is feeling better, dyspnea continue to improve, no chest pain and he has been out of the bed.   Physical Exam: Vitals:   03/20/22 0405 03/20/22 0600 03/20/22 0746 03/20/22 1255  BP: 115/62  (!) 98/58 114/60  Pulse: 70  94 70  Resp: _0 Temp: (!) 97.1 F (36.2 C) 98.1 F (36.7 C) (!) 97.5 F (36.4 C) 97.8 F (36.6 C)  TempSrc: Axillary Oral Oral Oral  SpO2: 98%  100% 97%  Weight: 84 kg      Neurology awake and alert ENT with no pallor Cardiovascular with S1 and S2 present and rhythmic with no gallops, rubs or murmurs No JVD No lower extremity edema Respiratory with no wheezing, scattered rales at bases Abdomen not distended  Data Reviewed:    Family Communication: I spoke with patient's wife at the bedside, we talked in detail about patient's condition, plan of care and prognosis and all questions were addressed.   Disposition: Status is: Inpatient   Planned Discharge Destination: Home    Author: Tawni Millers, MD 03/20/2022 4:59 PM  For on call review [email or web address].

## 2022-03-20 NOTE — Progress Notes (Signed)
PT Cancellation Note  Patient Details Name: Joshua [name] MRN: [id number] DOB: 09/26/1935   Cancelled Treatment:    Reason Eval/Treat Not Completed: Fatigue/lethargy limiting ability to participate. Pt soundly sleeping upon PT arrival with family member present stating that he was given PHENERGAN for his stomach which makes him very sleepy. She also stated that when she arrived this morning at 8:00 he was up in the chair eating his breakfast, and that he ambulated to the bathroom at back this morning to have a BM. PT will continue to f/u with pt acutely as available and appropriate.    Clearnce Sorrel [place] Mom 03/20/2022, 10:40 AM

## 2022-03-21 ENCOUNTER — Encounter (HOSPITAL_COMMUNITY): Payer: Self-pay | Admitting: Cardiology

## 2022-03-21 ENCOUNTER — Encounter (HOSPITAL_COMMUNITY): Payer: Medicare Other

## 2022-03-21 ENCOUNTER — Other Ambulatory Visit (HOSPITAL_COMMUNITY): Payer: Self-pay

## 2022-03-21 ENCOUNTER — Ambulatory Visit (HOSPITAL_COMMUNITY): Admit: 2022-03-21 | Payer: Medicare Other | Admitting: Cardiology

## 2022-03-21 ENCOUNTER — Encounter (HOSPITAL_COMMUNITY)
Admission: EM | Disposition: A | Discharge status: Home / Self Care | Payer: Self-pay | Source: Home / Self Care | Attending: Internal Medicine

## 2022-03-21 DIAGNOSIS — A419 Sepsis, unspecified organism: Secondary | ICD-10-CM | POA: Diagnosis not present

## 2022-03-21 DIAGNOSIS — I272 Pulmonary hypertension, unspecified: Secondary | ICD-10-CM

## 2022-03-21 DIAGNOSIS — R6520 Severe sepsis without septic shock: Secondary | ICD-10-CM | POA: Diagnosis not present

## 2022-03-21 DIAGNOSIS — N179 Acute kidney failure, unspecified: Secondary | ICD-10-CM | POA: Diagnosis not present

## 2022-03-21 DIAGNOSIS — N189 Chronic kidney disease, unspecified: Secondary | ICD-10-CM | POA: Diagnosis not present

## 2022-03-21 DIAGNOSIS — I214 Non-ST elevation (NSTEMI) myocardial infarction: Secondary | ICD-10-CM | POA: Diagnosis not present

## 2022-03-21 HISTORY — PX: RIGHT/LEFT HEART CATH AND CORONARY ANGIOGRAPHY: CATH118266

## 2022-03-21 LAB — POCT I-STAT EG7
Acid-Base Excess: 4 mmol/L — ABNORMAL HIGH (ref 0.0–2.0)
Acid-Base Excess: 5 mmol/L — ABNORMAL HIGH (ref 0.0–2.0)
Bicarbonate: 30.6 mmol/L — ABNORMAL HIGH (ref 20.0–28.0)
Bicarbonate: 31 mmol/L — ABNORMAL HIGH (ref 20.0–28.0)
Calcium, Ion: 1.42 mmol/L — ABNORMAL HIGH (ref 1.15–1.40)
Calcium, Ion: 1.43 mmol/L — ABNORMAL HIGH (ref 1.15–1.40)
HCT: 35 % — ABNORMAL LOW (ref 39.0–52.0)
HCT: 36 % — ABNORMAL LOW (ref 39.0–52.0)
Hemoglobin: 11.9 g/dL — ABNORMAL LOW (ref 13.0–17.0)
Hemoglobin: 12.2 g/dL — ABNORMAL LOW (ref 13.0–17.0)
O2 Saturation: 63 %
O2 Saturation: 64 %
Potassium: 4.4 mmol/L (ref 3.5–5.1)
Potassium: 4.5 mmol/L (ref 3.5–5.1)
Sodium: 141 mmol/L (ref 135–145)
Sodium: 142 mmol/L (ref 135–145)
TCO2: 32 mmol/L (ref 22–32)
TCO2: 33 mmol/L — ABNORMAL HIGH (ref 22–32)
pCO2, Ven: 53.3 mmHg (ref 44–60)
pCO2, Ven: 53.8 mmHg (ref 44–60)
pH, Ven: 7.367 (ref 7.25–7.43)
pH, Ven: 7.369 (ref 7.25–7.43)
pO2, Ven: 35 mmHg (ref 32–45)
pO2, Ven: 35 mmHg (ref 32–45)

## 2022-03-21 LAB — MAGNESIUM: Magnesium: 2 mg/dL (ref 1.7–2.4)

## 2022-03-21 LAB — BASIC METABOLIC PANEL
Anion gap: 5 (ref 5–15)
BUN: 15 mg/dL (ref 8–23)
CO2: 31 mmol/L (ref 22–32)
Calcium: 10 mg/dL (ref 8.9–10.3)
Chloride: 104 mmol/L (ref 98–111)
Creatinine, Ser: 1 mg/dL (ref 0.61–1.24)
GFR, Estimated: >60 mL/min (ref >60)
Glucose, Bld: 84 mg/dL (ref 70–99)
Potassium: 4.5 mmol/L (ref 3.5–5.1)
Sodium: 140 mmol/L (ref 135–145)

## 2022-03-21 LAB — GLUCOSE, CAPILLARY: Glucose-Capillary: 87 mg/dL (ref 70–99)

## 2022-03-21 LAB — CULTURE, BLOOD (ROUTINE X 2)
Culture: NO GROWTH
Culture: NO GROWTH
Special Requests: ADEQUATE
Special Requests: ADEQUATE

## 2022-03-21 LAB — HEPARIN LEVEL (UNFRACTIONATED): Heparin Unfractionated: 0.62 IU/mL (ref 0.30–0.70)

## 2022-03-21 LAB — APTT: aPTT: 79 seconds — ABNORMAL HIGH (ref 24–36)

## 2022-03-21 LAB — PHOSPHORUS: Phosphorus: 2.5 mg/dL (ref 2.5–4.6)

## 2022-03-21 SURGERY — RIGHT/LEFT HEART CATH AND CORONARY ANGIOGRAPHY
Anesthesia: LOCAL

## 2022-03-21 MED ORDER — HEPARIN SODIUM (PORCINE) 1000 UNIT/ML IJ SOLN
INTRAMUSCULAR | Status: AC
  Filled 2022-03-21: qty 10

## 2022-03-21 MED ORDER — DAPAGLIFLOZIN PROPANEDIOL 10 MG PO TABS
10.0000 mg | ORAL_TABLET | Freq: Every day | ORAL | Status: DC
  Administered 2022-03-21: 10 mg via ORAL
  Filled 2022-03-21: qty 1

## 2022-03-21 MED ORDER — HEPARIN (PORCINE) IN NACL 1000-0.9 UT/500ML-% IV SOLN
INTRAVENOUS | Status: AC
  Filled 2022-03-21: qty 1000

## 2022-03-21 MED ORDER — ACETAMINOPHEN 325 MG PO TABS: 650.0000 mg | ORAL_TABLET | ORAL | Status: DC | PRN

## 2022-03-21 MED ORDER — TORSEMIDE 10 MG PO TABS
10.0000 mg | ORAL_TABLET | Freq: Every day | ORAL | 2 refills | Status: DC
  Filled 2022-03-21: qty 30, 30d supply, fill #0

## 2022-03-21 MED ORDER — HEPARIN SODIUM (PORCINE) 1000 UNIT/ML IJ SOLN
INTRAMUSCULAR | Status: DC | PRN
  Administered 2022-03-21: 4000 [IU] via INTRAVENOUS

## 2022-03-21 MED ORDER — SODIUM CHLORIDE 0.9% FLUSH: 3.0000 mL | Freq: Two times a day (BID) | INTRAVENOUS | Status: DC

## 2022-03-21 MED ORDER — APIXABAN 5 MG PO TABS
5.0000 mg | ORAL_TABLET | Freq: Two times a day (BID) | ORAL | Status: DC
Start: 2022-03-21 — End: 2022-03-21

## 2022-03-21 MED ORDER — LABETALOL HCL 5 MG/ML IV SOLN
10.0000 mg | INTRAVENOUS | Status: AC | PRN
Start: 2022-03-21 — End: 2022-03-21

## 2022-03-21 MED ORDER — ATORVASTATIN CALCIUM 40 MG PO TABS
40.0000 mg | ORAL_TABLET | Freq: Every day | ORAL | 2 refills | Status: DC
  Filled 2022-03-21: qty 30, 30d supply, fill #0

## 2022-03-21 MED ORDER — FENTANYL CITRATE (PF) 100 MCG/2ML IJ SOLN
INTRAMUSCULAR | Status: AC
  Filled 2022-03-21: qty 2

## 2022-03-21 MED ORDER — AMOXICILLIN-POT CLAVULANATE 875-125 MG PO TABS
1.0000 | ORAL_TABLET | Freq: Two times a day (BID) | ORAL | 0 refills | Status: AC
Start: 2022-03-21 — End: 2022-03-23
  Filled 2022-03-21: qty 4, 2d supply, fill #0

## 2022-03-21 MED ORDER — LIDOCAINE HCL (PF) 1 % IJ SOLN
INTRAMUSCULAR | Status: AC
  Filled 2022-03-21: qty 30

## 2022-03-21 MED ORDER — ENOXAPARIN SODIUM 40 MG/0.4ML IJ SOSY
40.0000 mg | PREFILLED_SYRINGE | INTRAMUSCULAR | Status: DC
Start: 2022-03-22 — End: 2022-03-21

## 2022-03-21 MED ORDER — SODIUM CHLORIDE 0.9% FLUSH: 3.0000 mL | INTRAVENOUS | Status: DC | PRN

## 2022-03-21 MED ORDER — HYDRALAZINE HCL 20 MG/ML IJ SOLN: 10.0000 mg | INTRAMUSCULAR | Status: AC | PRN

## 2022-03-21 MED ORDER — SODIUM CHLORIDE 0.9 % IV SOLN: 250.0000 mL | INTRAVENOUS | Status: DC | PRN

## 2022-03-21 MED ORDER — VERAPAMIL HCL 2.5 MG/ML IV SOLN
INTRAVENOUS | Status: DC | PRN
  Administered 2022-03-21: 10 mL via INTRA_ARTERIAL

## 2022-03-21 MED ORDER — IOHEXOL 350 MG/ML SOLN
INTRAVENOUS | Status: DC | PRN
  Administered 2022-03-21: 45 mL

## 2022-03-21 MED ORDER — FENTANYL CITRATE (PF) 100 MCG/2ML IJ SOLN
INTRAMUSCULAR | Status: DC | PRN
  Administered 2022-03-21: 25 ug via INTRAVENOUS

## 2022-03-21 MED ORDER — MIDAZOLAM HCL 2 MG/2ML IJ SOLN
INTRAMUSCULAR | Status: DC | PRN
  Administered 2022-03-21: 1 mg via INTRAVENOUS

## 2022-03-21 MED ORDER — ONDANSETRON HCL 4 MG/2ML IJ SOLN: 4.0000 mg | Freq: Four times a day (QID) | INTRAMUSCULAR | Status: DC | PRN

## 2022-03-21 MED ORDER — HEPARIN (PORCINE) IN NACL 1000-0.9 UT/500ML-% IV SOLN
INTRAVENOUS | Status: DC | PRN
  Administered 2022-03-21 (×2): 500 mL

## 2022-03-21 MED ORDER — VERAPAMIL HCL 2.5 MG/ML IV SOLN
INTRAVENOUS | Status: AC
  Filled 2022-03-21: qty 2

## 2022-03-21 MED ORDER — TORSEMIDE 10 MG PO TABS: 10.0000 mg | ORAL_TABLET | Freq: Every day | ORAL | Status: DC

## 2022-03-21 MED ORDER — MIDAZOLAM HCL 2 MG/2ML IJ SOLN
INTRAMUSCULAR | Status: AC
  Filled 2022-03-21: qty 2

## 2022-03-21 MED ORDER — VERAPAMIL HCL 2.5 MG/ML IV SOLN
INTRAVENOUS | Status: AC
Start: 2022-03-21 — End: ?
  Filled 2022-03-21: qty 2

## 2022-03-21 MED ORDER — SODIUM CHLORIDE 0.9 % WEIGHT BASED INFUSION
1.0000 mL/kg/h | INTRAVENOUS | Status: AC
  Administered 2022-03-21: 1 mL/kg/h via INTRAVENOUS

## 2022-03-21 MED ORDER — LIDOCAINE HCL (PF) 1 % IJ SOLN
INTRAMUSCULAR | Status: DC | PRN
  Administered 2022-03-21 (×2): 2 mL via INTRADERMAL

## 2022-03-21 SURGICAL SUPPLY — 12 items
BAND CMPR LRG ZPHR (HEMOSTASIS) ×1
BAND ZEPHYR COMPRESS 30 LONG (HEMOSTASIS) ×1 IMPLANT
CATH 5FR JL3.5 JR4 ANG PIG MP (CATHETERS) ×1 IMPLANT
CATH BALLN WEDGE 5F 110CM (CATHETERS) ×1 IMPLANT
GLIDESHEATH SLEND SS 6F .021 (SHEATH) ×1 IMPLANT
GUIDEWIRE .025 260CM (WIRE) ×1 IMPLANT
GUIDEWIRE INQWIRE 1.5J.035X260 (WIRE) IMPLANT
INQWIRE 1.5J .035X260CM (WIRE) ×2
KIT HEART LEFT (KITS) ×2 IMPLANT
PACK CARDIAC CATHETERIZATION (CUSTOM PROCEDURE TRAY) ×2 IMPLANT
SHEATH GLIDE SLENDER 4/5FR (SHEATH) ×1 IMPLANT
TRANSDUCER W/STOPCOCK (MISCELLANEOUS) ×2 IMPLANT

## 2022-03-21 NOTE — TOC CM/SW Note (Signed)
Joshua [name] continues to exhibit signs of chronic hypoxemic respiratory failure due to COPD.  The use of the NIV will treat patient's elevated PCO2 (60.5 with an elevated bicarbonate of 35.9)  and can reduce risk of exacerbations and future hospitalizations when used at night and during the day.  All alternate devices [id number] and [id number]) have been considered and ruled out as volume requirements are not met by BiLevel devices.  An NIV with volume-targeted pressure support is necessary to prevent patient from life-threatening harm.  Interruption or failure to provide NIV would quickly lead to exacerbation of the patient's condition, hospital re-admission, and likely harm to the patient. Continued use is preferred.  Patient is able to protect their airways and clear secretions on their own.

## 2022-03-21 NOTE — Progress Notes (Signed)
PT Cancellation Note  Patient Details Name: Joshua [name] MRN: [id number] DOB: 1935/12/29   Cancelled Treatment:    Reason Eval/Treat Not Completed: Patient at procedure or test/unavailable. Pt at heart cath. Will continue to follow.   [place] 03/21/2022, 8:25 AM [place] Office [phone]

## 2022-03-21 NOTE — Discharge Summary (Signed)
DISCHARGE SUMMARY  Joshua [name]  MR#: [id number]  DOB:08-25-1935  Date of Admission: 03/16/2022 Date of Discharge: 03/21/2022  Attending Physician:Deari Sessler Hennie Duos, MD  Patient's ZYS:AYTKZSW, Joshua Dyer, DO  Consults: Heart Failure Team  Disposition: Discharge home  Follow-up Appts:  Follow-up Information     [facility] Follow up.   Why: [place] RN and Physical Therapy-agency will call to arrange appt Contact information: [place] Follow up.   Specialty: Cardiology Why: 04/26/22 at 10:00 AM   [facility] at [facility], [place] information: [address] [id number] [place]        Joshua Morning, DO Follow up.   Specialty: Family Medicine Contact information: [address] [place] [address] [phone]                 Tests Needing Follow-up: -assess patient tolerance of home NIMV newly arranged during this admit  -f/u LFTs  Discharge Diagnoses: Severe sepsis - Septic shock POA Elevated liver enzymes due to liver shock and right sided heart failure Aspiration Pneumonia RLL POA NSTEMI (non-ST elevated myocardial infarction) due to shock/demand ischemia  Chronic diastolic CHF  Severe pulmonary hypertension, possible class 1 or 4 / R heart failure / Chronic PE Acute kidney injury superimposed on CKD Stage 3a Hyponatremia and Hypomagnesemia COPD with emphysema  Dyslipidemia Pulmonary embolism - R LE DVT  Brief Narrative: 86 year old with a history of chronic diastolic CHF, HTN, COPD with chronic hypoxemic respiratory failure, and CKD who presented to the ER with nausea and vomiting x24 hours that then led to severe dyspnea and fever.  CT chest at presentation revealed a right lower lobe infiltrate as well as severe coronary calcifications.  Patient was admitted  with a diagnosis of aspiration pneumonia and placed on empiric broad-spectrum antibiotics.  Following his admission the patient developed shock and required vasopressor therapy with norepinephrine.  This was associated with a significant peak and his troponin, up to approximately 9000.  The patient was anticoagulated with heparin and an echocardiogram was accomplished which noted severe RV systolic dysfunction but preserved systolic LV function.  Fortunately the patient stabilized hemodynamically and was able to be weaned off the norepinephrine.  He was transferred from the PCCM service to [facility] on 8/1.  Antibiotic therapy was able to be transition to oral.  Cardiology took the patient for cardiac cath during his hospital stay.  Hospital Course:  Severe sepsis - Septic shock POA Cultures with no growth - clinical presentation consistent with aspiration pneumonia, right lower lobe - complete abx course w/ transition to oral meds at time of d/c   Elevated liver enzymes due to liver shock and right sided heart failure AST 523 and ALT 1,011, total Bil 0.7 - Liver US with no parenchymal abnormalities and no biliary duct dilatation - LFTs improving at time of d/c   Aspiration Pneumonia RLL POA Clinically improving - sepsis resolved - changed antibiotic therapy to Augmentin with plan to complete total of 8 days  NSTEMI (non-ST elevated myocardial infarction) due to shock/demand ischemia  Echocardiogram with no wall motion abnormalities - patient with no chest pain - coronary calcification noted on CT chest - continue with aspirin and statin - no B blocker or ace due to risk of hypotension - cardiac cath 8/3 noted the following: Prox RCA lesion is 30% stenosed.  Dist RCA lesion is 30% stenosed.   Ost LAD to Prox LAD lesion is 25% stenosed. 1. Mild nonobstructive CAD. 2. Low filling pressures. 3. Mild pulmonary arterial hypertension.   Chronic diastolic CHF  Echocardiogram with preserved LV systolic  function EF 60 to 65%. No wall motion abnormalities. Interventricular septum flattened in systolic. RV with severe reduction in systolic function. RVSP 49,7 mmHg (suboptimal TR jet assessment), severe dilatation RA.   Severe pulmonary hypertension, possible class 1 or 4 / R heart failure / Chronic PE Clinically euvolemic - continue midodrine for blood pressure support - to f/u w/ Heart Failure Team as outpt - consider addition of Tyvaso as outpatient   Acute kidney injury superimposed on CKD Stage 3a CKD stage 3a with base cr around 1.4 - peaked at 3.06 - has returned to his baseline   Hyponatremia and Hypomagnesemia  Resolved/supplemented to normal prior to d/c home  COPD with emphysema - clinically evident sleep apnea  Exhibited signs of chronic hypoxemic and hypercarbic respiratory failure due to COPD during this admission.  The use of NIV will treat patient's elevated PCO2 (60.5 with an elevated bicarbonate of 35.9)  and can reduce risk of exacerbations and future hospitalizations when used at night and during the day.  All alternate devices have been considered and ruled out as volume requirements are not met by BiLevel devices.  A NIV with volume-targeted pressure support is necessary to prevent patient from life-threatening harm.  Interruption or failure to provide NIV would quickly lead to exacerbation of the patient's condition, hospital re-admission, and likely harm to the patient. Continued use is preferred.  Patient is able to protect their airways and clear secretions on their own.  Hypertension - not an active issue this admit  Continue midodrine for blood pressure support  Dyslipidemia Patient on high dose statin, close follow up on liver enzymes  Pulmonary embolism - R LE DVT Diagnosed w/ V/Q scan - probable chronic R LE DVT on duples this admit - tx initially w/ IV heparin - transition to oral anticoagulation before d/c    Allergies as of 03/21/2022       Reactions   Other  Swelling   Beer- Swelling    Sunflower Oil Swelling   Sulfa Antibiotics Rash        Medication List     STOP taking these medications    doxycycline 100 MG capsule Commonly known as: VIBRAMYCIN   potassium chloride 10 MEQ tablet Commonly known as: KLOR-CON       TAKE these medications    albuterol 108 (90 Base) MCG/ACT inhaler Commonly known as: VENTOLIN HFA Inhale 1 puff into the lungs every 4 (four) hours as needed for shortness of breath.   allopurinol 100 MG tablet Commonly known as: ZYLOPRIM Take 100 mg by mouth every [name].   amoxicillin-clavulanate 875-125 MG tablet Commonly known as: AUGMENTIN Take 1 tablet by mouth every 12 (twelve) hours for 2 days.   atorvastatin 40 MG tablet Commonly known as: LIPITOR Take 1 tablet (40 mg total) by mouth daily.   Azelastine HCl 0.15 % Soln Place 2 sprays into the nose daily as needed (allergies).   Cinnamon 500 MG capsule Take 500 mg by mouth every [name].   dapagliflozin propanediol 10 MG Tabs tablet Commonly known as: Farxiga Take 1 tablet (10 mg total) by mouth daily before breakfast.   Eliquis 5 MG Tabs tablet Generic drug: apixaban TAKE 1 TABLET BY MOUTH TWICE A DAY   finasteride 5  MG tablet Commonly known as: PROSCAR Take 5 mg by mouth at bedtime.   gabapentin 100 MG capsule Commonly known as: NEURONTIN Take 300 mg by mouth 3 (three) times daily.   magnesium oxide 400 MG tablet Commonly known as: MAG-OX Take 1 tablet (400 mg total) by mouth daily.   metoCLOPramide 5 MG tablet Commonly known as: REGLAN Take 5 mg by mouth 3 (three) times daily before meals.   midodrine 10 MG tablet Commonly known as: PROAMATINE Take 1.5 tablets (15 mg total) by mouth 3 (three) times daily with meals.   multivitamin with minerals Tabs tablet Take 1 tablet by mouth daily.   ondansetron 4 MG tablet Commonly known as: ZOFRAN Take 1 tablet (4 mg total) by mouth every 6 (six) hours as needed for nausea.    OXYGEN Inhale 2 L into the lungs continuous.   pantoprazole 40 MG tablet Commonly known as: PROTONIX Take 40 mg by mouth every [name].   Riociguat 2 MG Tabs Take 2 mg by mouth in the [name], at noon, and at bedtime.   Stiolto Respimat 2.5-2.5 MCG/ACT Aers Generic drug: Tiotropium Bromide-Olodaterol INHALE 2 PUFFS BY MOUTH INTO THE LUNGS DAILY What changed: See the new instructions.   torsemide 10 MG tablet Commonly known as: DEMADEX Take 1 tablet (10 mg total) by mouth daily. Start taking on: March 22, 2022 What changed:  medication strength how much to take        Day of Discharge BP (!) 142/65 (BP Location: Left Leg)   Pulse 67   Temp 98.4 F (36.9 C) (Oral)   Resp 11   Wt 82.6 kg   SpO2 96%   BMI 24.68 kg/m   Physical Exam: General: No acute respiratory distress Lungs: Clear to auscultation bilaterally without wheezes or crackles Cardiovascular: Regular rate and rhythm without murmur gallop or rub normal S1 and S2 Abdomen: Nontender, nondistended, soft, bowel sounds positive, no rebound, no ascites, no appreciable mass Extremities: No significant cyanosis, clubbing, or edema bilateral lower extremities  Basic Metabolic Panel: Recent Labs  Lab 03/17/22 0710 03/18/22 0337 03/19/22 0403 03/20/22 0425 03/21/22 0311  NA 137 136 138 139 140  K 4.3 3.9 3.8 4.1 4.5  CL 103 103 102 106 104  CO2 _0 GLUCOSE 75 115* 96 84 84  BUN 42* 28* _1 CREATININE 2.22* 1.53* 1.39* 1.14 1.00  CALCIUM 9.1 9.0 8.8* 9.2 10.0  MG 2.0 1.8 1.8 1.6* 2.0  PHOS 2.6 2.3* 2.4* 2.2* 2.5    Liver Function Tests: Recent Labs  Lab 03/16/22 0914 03/17/22 0710 03/18/22 0337 03/19/22 0403 03/20/22 0425  AST 359* 1,042* 893* 901* 523*  ALT 272* 1,022* 1,084* 1,260* 1,011*  ALKPHOS 99 92 84 81 82  BILITOT 1.7* 1.2 0.9 0.7 0.7  PROT 6.9 6.1* 6.1* 6.6 6.0*  ALBUMIN 3.4* 2.9* 2.7* 2.5* 2.6*    CBC: Recent Labs  Lab 03/16/22 0914 03/17/22 0710  03/18/22 0337 03/19/22 0403  WBC 15.6* 10.1 6.6 5.0  NEUTROABS 14.3*  --   --   --   HGB 11.2* 10.3* 10.9* 10.6*  HCT 35.4* 31.3* 33.1* 33.2*  MCV 89.8 86.5 86.9 87.4  PLT 156 139* 123* 150     Time spent in discharge (includes decision making & examination of pt): 35 minutes  03/21/2022, 2:59 PM   Cherene Altes, MD [facility] Office  [phone]

## 2022-03-21 NOTE — Interval H&P Note (Signed)
History and Physical Interval Note:  03/21/2022 7:58 AM  Joshua [name]  has presented today for surgery, with the diagnosis of heart failure.  The various methods of treatment have been discussed with the patient and family. After consideration of risks, benefits and other options for treatment, the patient has consented to  Procedure(s): RIGHT/LEFT HEART CATH AND CORONARY ANGIOGRAPHY (N/A) as a surgical intervention.  The patient's history has been reviewed, patient examined, no change in status, stable for surgery.  I have reviewed the patient's chart and labs.  Questions were answered to the patient's satisfaction.     Mionna Advincula [employer]

## 2022-03-21 NOTE — Progress Notes (Signed)
Physical Therapy Treatment Patient Details Name: Joshua [name] MRN: [id number] DOB: 09-Dec-1935 Today's Date: 03/21/2022   History of Present Illness Pt is an 86 y.o. male admitted 03/16/22 with nausea, vomiting and progressive dyspnea; workup for septic shock secondary to aspiration pneumonitis vs colitis. PMH includes CHF, HTN, COPD, CKD, OSA, PE, CKD.    PT Comments    Pt doing well with mobility after cath and eager to go home. Slightly unsteady but no loss of balance.    Recommendations for follow up therapy are one component of a multi-disciplinary discharge planning process, led by the attending physician.  Recommendations may be updated based on patient status, additional functional criteria and insurance authorization.  Follow Up Recommendations  No PT follow up     Assistance Recommended at Discharge Intermittent Supervision/Assistance  Patient can return home with the following A little help with bathing/dressing/bathroom;Assistance with cooking/housework;Assist for transportation;Help with stairs or ramp for entrance   Equipment Recommendations  None recommended by PT    Recommendations for Other Services       Precautions / Restrictions Precautions Precautions: Fall;Other (comment) Precaution Comments: cardiac cath via radial 8/3     Mobility  Bed Mobility Overal bed mobility: Modified Independent             General bed mobility comments: verbal cues to not push/pull with rt hand    Transfers Overall transfer level: Needs assistance Equipment used: None Transfers: Sit to/from Stand Sit to Stand: Min guard           General transfer comment: assist for safety    Ambulation/Gait Ambulation/Gait assistance: Min guard, Supervision Gait Distance (Feet): 300 Feet Assistive device: None Gait Pattern/deviations: Step-through pattern Gait velocity: Decreased Gait velocity interpretation: 1.31 - 2.62 ft/sec, indicative of limited community ambulator    General Gait Details: Assist for safety   Stairs             Wheelchair Mobility    Modified Rankin (Stroke Patients Only)       Balance Overall balance assessment: Needs assistance Sitting-balance support: No upper extremity supported, Feet supported Sitting balance-Leahy Scale: Normal     Standing balance support: No upper extremity supported, During functional activity Standing balance-Leahy Scale: Good                              Cognition Arousal/Alertness: Awake/alert Behavior During Therapy: WFL for tasks assessed/performed Overall Cognitive Status: Within Functional Limits for tasks assessed                                          Exercises      General Comments General comments (skin integrity, edema, etc.): SpO2 95% on 2L at rest, 86% on RA at rest. Amb on 2L with SpO2 > 90%      Pertinent Vitals/Pain Pain Assessment Pain Assessment: No/denies pain    Home Living                          Prior Function            PT Goals (current goals can now be found in the care plan section) Acute Rehab PT Goals Patient Stated Goal: return home Progress towards PT goals: Progressing toward goals    Frequency    Min 3X/week  PT Plan Current plan remains appropriate    Co-evaluation              AM-PAC PT "6 Clicks" Mobility   Outcome Measure  Help needed turning from your back to your side while in a flat bed without using bedrails?: None Help needed moving from lying on your back to sitting on the side of a flat bed without using bedrails?: None Help needed moving to and from a bed to a chair (including a wheelchair)?: A Little Help needed standing up from a chair using your arms (e.g., wheelchair or bedside chair)?: A Little Help needed to walk in hospital room?: A Little Help needed climbing 3-5 steps with a railing? : A Little 6 Click Score: 20    End of Session Equipment Utilized  During Treatment: Oxygen Activity Tolerance: Patient tolerated treatment well Patient left: in bed;with call bell/phone within reach;with family/visitor present (sitting EOB) Nurse Communication: Mobility status PT Visit Diagnosis: Other abnormalities of gait and mobility (R26.89)     Time: 6916-7561 PT Time Calculation (min) (ACUTE ONLY): 14 min  Charges:  $Gait Training: 8-22 mins                     [place] Office [place] 03/21/2022, 4:58 PM

## 2022-03-21 NOTE — TOC Initial Note (Signed)
Transition of Care [facility]) - Initial/Assessment Note    Patient Details  Name: Joshua [name] MRN: [id number] Date of Birth: 1936/06/05  Transition of Care [facility]) CM/SW Contact:    Erenest Rasher, RN Phone Number: [phone] 03/21/2022, 4:28 PM  Clinical Narrative:                 HF TOC CM spoke to pt and wife at bedside. Attending completed order for NIV and gave to [place], Zach. Pt will be enrolled in their Breathe Right program. Meds came up from [place]. Contacted [facility] rep, Caryl Pina to make aware of dc home today.   Expected Discharge Plan: [place] Barriers to Discharge: No Barriers Identified   Patient Goals and CMS Choice Patient states their goals for this hospitalization and ongoing recovery are:: wants to get better CMS Medicare.gov Compare Post Acute Care list provided to:: Patient Choice offered to / list presented to : Patient  Expected Discharge Plan and Services Expected Discharge Plan: [place]   Discharge Planning Services: CM Consult Post Acute Care Choice: [place] arrangements for the past 2 months: Single Family Home Expected Discharge Date: 03/21/22               DME Arranged: NIV DME Agency: [employer] Date DME Agency Contacted: 03/21/22 Time DME Agency Contacted: [phone] Representative spoke with at DME Agency: [place]: RN, PT [place] Agency: [place] ([place]) Date [place]: 03/21/22 Time [place]: 1627 Representative spoke with at [place]: [place] Arrangements/Services Living arrangements for the past 2 months: [place] Lives with:: Spouse Patient language and need for interpreter reviewed:: Yes Do you feel safe going back to the place where you live?: Yes      Need for Family Participation in Patient Care: Yes (Comment) Care giver support system in place?: Yes (comment) Current home services: DME  (cane, rolling walker, oxygen ([employer])) Criminal Activity/Legal Involvement Pertinent to Current Situation/Hospitalization: No - Comment as needed  Activities of Daily Living      Permission Sought/Granted Permission sought to share information with : Case Manager, Family Supports, PCP Permission granted to share information with : Yes, Verbal Permission Granted  Share Information with NAME: Joshua [name], [place] granted to share info w Relationship: wife, daughter Chauncey [place]  Permission granted to share info w Contact Information: [phone], [phone]  Emotional Assessment Appearance:: Appears stated age Attitude/Demeanor/Rapport: Engaged Affect (typically observed): Accepting Orientation: : Oriented to Self, Oriented to Place, Oriented to  Time, Oriented to Situation   Psych Involvement: No (comment)  Admission diagnosis:  Severe sepsis ([place]) [A41.9, R65.20] Sepsis with acute renal failure and septic shock, due to unspecified organism, unspecified acute renal failure type ([place]) [A41.9, R65.21, N17.9] Patient Active Problem List   Diagnosis Date Noted   NSTEMI (non-ST elevated myocardial infarction) ([place]) 03/19/2022   Acute kidney injury superimposed on chronic kidney disease ([place]) 03/19/2022   COPD with emphysema ([place]) 03/19/2022   Dyslipidemia    Chronic diastolic CHF (congestive heart failure) ([place])    Pulmonary embolism ([place]) 03/17/2022   Severe sepsis ([place]) 03/16/2022   Protein-calorie malnutrition, severe ([place]) 12/26/2021   Palliative care by specialist    Hypokalemia 10/05/2021   Abnormal gait 10/05/2021   Gastroesophageal reflux disease without esophagitis 10/05/2021   Hiatal hernia 10/05/2021   Hiccoughs 10/05/2021   Oxygen dependent 10/05/2021  Prediabetes 10/05/2021   Pure hypercholesterolemia 10/05/2021   CKD (chronic kidney disease) stage 2, GFR 60-89 ml/min 10/04/2021   Gout 10/04/2021   Asymmetrical sensorineural hearing loss  08/29/2021   Nasal dryness 08/29/2021   Ear stuffiness, bilateral 05/09/2021   Acute renal failure superimposed on stage 3 chronic kidney disease ([place]) 04/19/2021   Intractable nausea and vomiting 04/19/2021   Hyperkalemia 04/19/2021   Transaminitis 04/19/2021   Elevated troponin 04/19/2021   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   Acute on chronic right-sided heart failure ([place]) 04/12/2021   Acute kidney injury superimposed on CKD ([place]) 04/12/2021   OSA (obstructive sleep apnea) 12/07/2020   Aortic atherosclerosis ([place]) 12/07/2020   CHF (congestive heart failure) ([place]) 12/03/2020   Syncope and collapse 12/03/2020   Pseudophakia, both eyes 06/07/2019   Pulmonary hypertension ([place]) 09/29/2017   Benign prostatic hyperplasia with lower urinary tract symptoms 08/25/2017   Syncope, vasovagal 08/25/2017   Demand ischemia of myocardium ([place]) 08/25/2017   Arthritis 08/24/2017   Hypertension 02/16/2015   Chronic obstructive pulmonary disease, unspecified ([place]) 12/12/2014   Personal history of pulmonary embolism 10/10/2014   Chronic respiratory failure ([place]) 10/10/2014   S/P knee replacement 09/13/2014   PCP:  [name], DO Pharmacy:   [id number] [address]Phone: [phone]: [phone] [place] [address]Phone: [phone]: [phone]    Social Determinants of Health (SDOH) Interventions    Readmission Risk Interventions    10/05/2021    3:15 PM  Readmission Risk Prevention Plan  Transportation Screening Complete  Medication Review ([place] Complete  PCP or Specialist appointment within 3-5 days of discharge Complete  HRI or [place]  SW Recovery Care/Counseling Consult Complete  Palliative Care Screening Not [place]  Applicable

## 2022-03-21 NOTE — Progress Notes (Signed)
Patient ID: Joshua [name], male   DOB: 13-Jan-1936, 86 y.o.   MRN: [id number]     Advanced Heart Failure Rounding Note  PCP-Cardiologist: Sherren Mocha, MD   Subjective:    Admitted w/ septic shock due to aspiration pneumonia vs colitis  w/ significantly elevated HS trop.   Much improved w/ abx. Now on Augmentin for suspected aspiration PNA.   BP now stable off NE support. Back on home midodrine regimen. AKI resolved, creatinine 1 today.   No dyspnea or chest pain.   Echo 7/31: RV severely reduced, severely enlarged, RVSP 50, LVEF 60-65%, no RWMA, mild TR, trivial MR.   LHC/RHC (8/3):  Coronary Findings  Diagnostic Dominance: Right Left Anterior Descending  Ost LAD to Prox LAD lesion is 25% stenosed.    Right Coronary Artery  Prox RCA lesion is 30% stenosed.  Dist RCA lesion is 30% stenosed.    Intervention   No interventions have been documented.   Right Heart  Right Heart Pressures RHC Procedural Findings: Hemodynamics (mmHg) RA mean 1 RV 47/3 PA 46/10, mean 25 PCWP mean 4 LV 95/4 AO 101/49  Oxygen saturations: PA 64% AO 95%  Cardiac Output (Fick) 6.14  Cardiac Index (Fick) 2.98 PVR 3.4 WU   Objective:   Weight Range: 82.6 kg Body mass index is 24.68 kg/m.   Vital Signs:   Temp:  [97 F (36.1 C)-98.7 F (37.1 C)] 98.7 F (37.1 C) (08/03 0447) Pulse Rate:  [60-77] 69 (08/03 0612) Resp:  [12-15] 14 (08/03 0612) BP: (99-122)/(56-65) 112/65 (08/03 0612) SpO2:  [93 %-100 %] 93 % (08/03 0744) Weight:  [82.6 kg] 82.6 kg (08/03 0447) Last BM Date : 03/20/22 (Pt stated)  Weight change: Filed Weights   03/19/22 0414 03/20/22 0405 03/21/22 0447  Weight: 82.3 kg 84 kg 82.6 kg    Intake/Output:   Intake/Output Summary (Last 24 hours) at 03/21/2022 0851 Last data filed at 03/21/2022 0535 Gross per 24 hour  Intake 971 ml  Output 3600 ml  Net -2629 ml      Physical Exam   General: NAD Neck: No JVD, no thyromegaly or thyroid nodule.  Lungs:  Clear to auscultation bilaterally with normal respiratory effort. CV: Nondisplaced PMI.  Heart regular S1/S2, no S3/S4, no murmur.  No peripheral edema.   Abdomen: Soft, nontender, no hepatosplenomegaly, no distention.  Skin: Intact without lesions or rashes.  Neurologic: Alert and oriented x 3.  Psych: Normal affect. Extremities: No clubbing or cyanosis.  HEENT: Normal.   Telemetry   NSR 80s   Labs    CBC Recent Labs    03/19/22 0403  WBC 5.0  HGB 10.6*  HCT 33.2*  MCV 87.4  PLT 092   Basic Metabolic Panel Recent Labs    03/20/22 0425 03/21/22 0311  NA 139 140  K 4.1 4.5  CL 106 104  CO2 30 31  GLUCOSE 84 84  BUN 16 15  CREATININE 1.14 1.00  CALCIUM 9.2 10.0  MG 1.6* 2.0  PHOS 2.2* 2.5   Liver Function Tests Recent Labs    03/19/22 0403 03/20/22 0425  AST 901* 523*  ALT 1,260* 1,011*  ALKPHOS 81 82  BILITOT 0.7 0.7  PROT 6.6 6.0*  ALBUMIN 2.5* 2.6*   No results for input(s): "LIPASE", "AMYLASE" in the last 72 hours.  Cardiac Enzymes No results for input(s): "CKTOTAL", "CKMB", "CKMBINDEX", "TROPONINI" in the last 72 hours.  BNP: BNP (last 3 results) Recent Labs    12/27/21 1001 03/15/22 9574  03/16/22 0914  BNP 83.7 170.1* 380.3*    ProBNP (last 3 results) No results for input(s): "PROBNP" in the last 8760 hours.   D-Dimer No results for input(s): "DDIMER" in the last 72 hours. Hemoglobin A1C No results for input(s): "HGBA1C" in the last 72 hours.  Fasting Lipid Panel Recent Labs    03/20/22 0425  CHOL 76  HDL 28*  LDLCALC 41  TRIG 35  CHOLHDL 2.7   Thyroid Function Tests No results for input(s): "TSH", "T4TOTAL", "T3FREE", "THYROIDAB" in the last 72 hours.  Invalid input(s): "FREET3"  Other results:   Imaging    CARDIAC CATHETERIZATION  Result Date: 03/21/2022   Prox RCA lesion is 30% stenosed.   Dist RCA lesion is 30% stenosed.   Ost LAD to Prox LAD lesion is 25% stenosed. 1. Mild nonobstructive CAD. 2. Low filling  pressures. 3. Mild pulmonary arterial hypertension.     Medications:     Scheduled Medications:  [MAR Hold] amoxicillin-clavulanate  1 tablet Oral Q12H   apixaban  5 mg Oral BID   [MAR Hold] arformoterol  15 mcg Nebulization BID   [MAR Hold] atorvastatin  80 mg Oral Daily   [MAR Hold] bisacodyl  10 mg Rectal QHS   [MAR Hold] Chlorhexidine Gluconate Cloth  6 each Topical Q0600   [START ON 03/22/2022] dapagliflozin propanediol  10 mg Oral Daily   [MAR Hold] finasteride  5 mg Oral Daily   [MAR Hold] gabapentin  300 mg Oral TID   [MAR Hold] metoCLOPramide  5 mg Oral TID AC   [MAR Hold] midodrine  15 mg Oral TID WC   [MAR Hold] pantoprazole  40 mg Oral Daily   [MAR Hold] revefenacin  175 mcg Nebulization Daily   [MAR Hold] Riociguat  2 mg Oral TID   [MAR Hold] sodium chloride flush  10-40 mL Intracatheter Q12H   [MAR Hold] sodium chloride flush  3 mL Intravenous Q12H   [START ON 03/22/2022] torsemide  10 mg Oral Daily    Infusions:  sodium chloride Stopped (03/20/22 1605)   sodium chloride     sodium chloride 10 mL/hr at 03/21/22 0046   [MAR Hold] promethazine (PHENERGAN) injection (IM or IVPB)      PRN Medications: sodium chloride, [MAR Hold] docusate sodium, [MAR Hold] polyethylene glycol, [MAR Hold] promethazine (PHENERGAN) injection (IM or IVPB), [MAR Hold] sodium chloride flush, sodium chloride flush    Patient Profile   86 y/o male w/ chronic HFpEF/RV failure, PH felt multifactorial from severe emphysema, OSA and chronic PE, Stage III CKD and nutcracker duodenal obstruction admitted w/ shock, felt to be primarily septic shock. AHF team consulted for elevated Hs trop  Assessment/Plan   1. Shock: Concern for septic shock with presentation with nausea/vomiting/fever/abdominal pain and possible RLL aspiration PNA + possible colitis. PCT 12.6.  He has improved with antibiotics. Shock resolved.  - Continue Augmentin.  - BP stable on midodrine 15 mg tid (home dose)   2. Elevated  troponin: HS-TnI up to 8818, no chest pain.  Coronary angiography done today showed nonobstructive mild CAD.  It appears that elevated troponin was demand ischemia due to shock and hypotension.  - Reasonable to continue statin with nonobstructive CAD.  3. ID: Suspect aspiration PNA from CT chest (RLL infiltrate), also concern for possible colitis based on abdominal CT. Suspect septic shock as above. Now improved   - Continue Augmentin.  4. Chronic HFpEF/RV failure: Echo (2/22) with EF 55-60%, IV septum flattened, severe RV enlargement, severely decreased  RV function, PASP 57 mmHg.  He has severe RV failure at baseline.  Echo this admit>>RV severely reduced, severely enlarged, RVSP 50, LVEF 60-65%, no RWMA, mild TR, trivial MR.  Chronically NYHA III. RHC today showed normal/low filling pressures and mild PAH.   - Gentle hydration post-cath.  - Start dapagliflozin 10 mg daily tomorrow.  - Start lower dose of torsemide 10 mg daily tomorrow.  5. Pulmonary HTN: PAH noted on 4/22 RHC with PVR 6.1 WU.  This appears to be multifactorial with OSA, severe emphysema, and a suspected chronic PE involving the right middle lobe (group 3 and group 4 PH). Given the suspected mixed etiology with only 1 area of chronic thromboembolism (right middle lobe) as well as age, do not think that pulmonary thromboendarterectomy would be indicated.  Rheumatologic serologic workup was negative.  PFTs showed severe obstruction and moderate restriction, suggesting significant COPD. RHC today showed mild pulmonary hypertension with PVR 3.4 WU.  - He is back on riociguat.  - Consider addition of Tyvaso as outpatient.  6. AKI on stage III CKD: Creatinine 1.78 => 3.06 in setting of shock.  Now improved creatinine back to 1.0.  7. OSA: Moderate OSA on sleep study. He refused CPAP. 8. Emphysema: Prior smoker.  Emphysema on CT and severe obstruction on PFTs. On 2L home oxygen.  9. Chronic PE: Diagnosed by V/Q scan.  No abnormal bleeding.  He has a probable chronic RLE DVT on venous doppler this admission.  - Restart Eliquis after cath.   10. Hiatial hernia w/ esophagitis: Has significant GERD. He follows with Dr. Benson Norway. - Continue PPI.  11. Nutcracker SMA: Compresses duodenum.  This does not appear to be causing his current symptoms.   Could go home today from my standpoint.  Cardiac meds: apixaban 5 mg bid, dapagliflozin 10 daily (start tomorrow), torsemide 10 mg daily (start tomorrow), riociguat 2 mg tid, atorvastatin 40 mg daily.   Length of Stay: 5  Loralie Champagne, MD  03/21/2022, 8:51 AM  Advanced Heart Failure Team Pager [phone] (M-F; 7a - 5p)  Please contact [facility] for night-coverage after hours (5p -7a ) and weekends on [email or web address]

## 2022-03-25 ENCOUNTER — Other Ambulatory Visit (HOSPITAL_COMMUNITY): Payer: Medicare Other

## 2022-03-26 ENCOUNTER — Encounter (HOSPITAL_COMMUNITY)
Admission: RE | Admit: 2022-03-26 | Discharge: 2022-03-26 | Disposition: A | Discharge status: Home / Self Care | Payer: Medicare Other | Source: Ambulatory Visit | Attending: Pulmonary Disease | Admitting: Pulmonary Disease

## 2022-03-26 VITALS — Wt 173.5 lb

## 2022-03-26 DIAGNOSIS — J449 Chronic obstructive pulmonary disease, unspecified: Secondary | ICD-10-CM

## 2022-03-26 NOTE — Progress Notes (Signed)
Daily Session Note  Patient Details  Name: Joshua [name] MRN: [id number] Date of Birth: 08-30-1935 Referring Provider:   April Manson Pulmonary Rehab Walk Test from 03/13/2022 in [place]  Referring Provider Elsworth Soho       Encounter Date: 03/26/2022  Check In:  Session Check In - 03/26/22 1207       Check-In   Supervising physician immediately available to respond to emergencies [facility] immediately available    Physician(s) Dr. [place]    Location [facility]    Staff Present Elmon Else, MS, ACSM-CEP, Exercise Physiologist;Jetta Gilford Rile BS, ACSM-CEP, Exercise Physiologist;Lisa Ysidro Evert, RN;Johnny Starleen Blue, MS, Exercise Physiologist;Olinty Celesta Aver, MS, ACSM-CEP, Exercise Physiologist    Virtual Visit No    Medication changes reported     No    Fall or balance concerns reported    No    Tobacco Cessation No Change    Warm-up and Cool-down Performed as group-led instruction    Resistance Training Performed Yes    VAD Patient? No    PAD/SET Patient? No      Pain Assessment   Currently in Pain? No/denies    Multiple Pain Sites No             Capillary Blood Glucose: No results found for this or any previous visit (from the past 24 hour(s)).   Exercise Prescription Changes - 03/26/22 1500       Response to Exercise   Blood Pressure (Admit) 90/50    Blood Pressure (Exercise) 102/50    Blood Pressure (Exit) 100/56    Heart Rate (Admit) 80 bpm    Heart Rate (Exercise) 107 bpm    Heart Rate (Exit) 75 bpm    Oxygen Saturation (Admit) 98 %    Oxygen Saturation (Exercise) 94 %    Oxygen Saturation (Exit) 98 %    Rating of Perceived Exertion (Exercise) 12    Perceived Dyspnea (Exercise) 1    Duration Continue with 30 min of aerobic exercise without signs/symptoms of physical distress.    Intensity THRR unchanged      Progression   Progression Continue to progress workloads to maintain intensity without  signs/symptoms of physical distress.      Resistance Training   Training Prescription Yes    Weight blue bands    Reps 10-15    Time 10 Minutes      Oxygen   Oxygen Continuous    Liters 2      NuStep   Level 1    SPM 80    Minutes 30    METs 1.6      Oxygen   Maintain Oxygen Saturation 88% or higher             Social History   Tobacco Use  Smoking Status Former   Packs/day: 1.00   Years: 50.00   Total pack years: 50.00   Types: Cigarettes   Start date: 41   Quit date: 08/19/2006   Years since quitting: 15.6  Smokeless Tobacco Never  Tobacco Comments   Former smoke 03/15/22    Goals Met:  Exercise tolerated well No report of concerns or symptoms today Strength training completed today  Goals Unmet:  Not Applicable  Comments: Service time is from 1014 to 1137.    Dr. Rodman Pickle is Medical Director for Pulmonary Rehab at [facility].

## 2022-03-27 NOTE — Progress Notes (Signed)
Pulmonary Individual Treatment Plan  Patient Details  Name: Joshua [name] MRN: [id number] Date of Birth: 01-19-1936 Referring Provider:   April Manson Pulmonary Rehab Walk Test from 03/13/2022 in [place]  Referring Provider Elsworth Soho       Initial Encounter Date:  Flowsheet Row Pulmonary Rehab Walk Test from 03/13/2022 in [place]  Date 03/13/22       Visit Diagnosis: Stage 2 moderate COPD by GOLD classification ([place])  Patient's Home Medications on Admission:   Current Outpatient Medications:    albuterol (VENTOLIN HFA) 108 (90 Base) MCG/ACT inhaler, Inhale 1 puff into the lungs every 4 (four) hours as needed for shortness of breath., Disp: , Rfl:    allopurinol (ZYLOPRIM) 100 MG tablet, Take 100 mg by mouth every morning., Disp: , Rfl:    atorvastatin (LIPITOR) 40 MG tablet, Take 1 tablet (40 mg total) by mouth daily., Disp: 30 tablet, Rfl: 2   Azelastine HCl 0.15 % SOLN, Place 2 sprays into the nose daily as needed (allergies)., Disp: , Rfl:    Cinnamon 500 MG capsule, Take 500 mg by mouth every morning., Disp: , Rfl:    dapagliflozin propanediol (FARXIGA) 10 MG TABS tablet, Take 1 tablet (10 mg total) by mouth daily before breakfast., Disp: 90 tablet, Rfl: 3   ELIQUIS 5 MG TABS tablet, TAKE 1 TABLET BY MOUTH TWICE A DAY, Disp: 60 tablet, Rfl: 11   finasteride (PROSCAR) 5 MG tablet, Take 5 mg by mouth at bedtime., Disp: , Rfl:    gabapentin (NEURONTIN) 100 MG capsule, Take 300 mg by mouth 3 (three) times daily., Disp: , Rfl:    magnesium oxide (MAG-OX) 400 MG tablet, Take 1 tablet (400 mg total) by mouth daily., Disp: 30 tablet, Rfl: 3   metoCLOPramide (REGLAN) 5 MG tablet, Take 5 mg by mouth 3 (three) times daily before meals., Disp: , Rfl:    midodrine (PROAMATINE) 10 MG tablet, Take 1.5 tablets (15 mg total) by mouth 3 (three) times daily with meals., Disp: 90 tablet, Rfl: 3   Multiple Vitamin (MULTIVITAMIN WITH  MINERALS) TABS tablet, Take 1 tablet by mouth daily., Disp: 30 tablet, Rfl: 0   ondansetron (ZOFRAN) 4 MG tablet, Take 1 tablet (4 mg total) by mouth every 6 (six) hours as needed for nausea., Disp: 20 tablet, Rfl: 0   OXYGEN, Inhale 2 L into the lungs continuous., Disp: , Rfl:    pantoprazole (PROTONIX) 40 MG tablet, Take 40 mg by mouth every morning., Disp: , Rfl:    Riociguat 2 MG TABS, Take 2 mg by mouth in the morning, at noon, and at bedtime., Disp: 90 tablet, Rfl: 11   STIOLTO RESPIMAT 2.5-2.5 MCG/ACT AERS, INHALE 2 PUFFS BY MOUTH INTO THE LUNGS DAILY (Patient taking differently: Inhale 2 each into the lungs daily.), Disp: 4 g, Rfl: 5   torsemide (DEMADEX) 10 MG tablet, Take 1 tablet (10 mg total) by mouth daily., Disp: 30 tablet, Rfl: 2  Past Medical History: Past Medical History:  Diagnosis Date   Arthritis    BPH (benign prostatic hyperplasia)    Chronic hiccups    COPD (chronic obstructive pulmonary disease) (HCC)    Folliculitis    posterior scalp per office visit note of Dr Maudie Mercury 07/20/2014     GERD (gastroesophageal reflux disease)    Gout    Hypertension    Hypotension    PE (pulmonary thromboembolism) ([place])    Phlebitis    right  arm  at least 20 years ago    Pneumonia    hx of pneumonia as a child    Pulmonary hypertension ([place])    Sleep apnea     Tobacco Use: Social History   Tobacco Use  Smoking Status Former   Packs/day: 1.00   Years: 50.00   Total pack years: 50.00   Types: Cigarettes   Start date: 65   Quit date: 08/19/2006   Years since quitting: 15.6  Smokeless Tobacco Never  Tobacco Comments   Former smoke 03/15/22    Labs: Review Flowsheet  More data exists      Latest Ref Rng & Units 10/04/2021 03/16/2022 03/17/2022 03/20/2022 03/21/2022  Labs for ITP Cardiac and Pulmonary Rehab  Cholestrol 0 - 200 mg/dL - - - 76  -  LDL (calc) 0 - 99 mg/dL - - - 41  -  HDL-C >40 mg/dL - - - 28  -  Trlycerides <150 mg/dL - - - 35  -  Hemoglobin A1c 4.8 - 5.6 %  - 5.0  - - -  Bicarbonate 20.0 - 28.0 mmol/L 20.0 - 28.0 mmol/L 33.4  - - - 30.6  31.0   TCO2 22 - 32 mmol/L 22 - 32 mmol/L 34  - - - 32  33   O2 Saturation % % 83  - 60.5  - 63  64     Capillary Blood Glucose: Lab Results  Component Value Date   GLUCAP 87 03/21/2022   GLUCAP 74 03/20/2022   GLUCAP 83 03/19/2022   GLUCAP 97 03/18/2022   GLUCAP 98 03/17/2022     Pulmonary Assessment Scores:  Pulmonary Assessment Scores     Row Name 03/13/22 1200         ADL UCSD   ADL Phase Entry     SOB Score total 48       CAT Score   CAT Score 20       mMRC Score   mMRC Score 1             UCSD: Self-administered rating of dyspnea associated with activities of daily living (ADLs) 6-point scale (0 = "not at all" to 5 = "maximal or unable to do because of breathlessness")  Scoring Scores range from 0 to 120.  Minimally important difference is 5 units  CAT: CAT can identify the health impairment of COPD patients and is better correlated with disease progression.  CAT has a scoring range of zero to 40. The CAT score is classified into four groups of low (less than 10), medium (10 - 20), high (21-30) and very high (31-40) based on the impact level of disease on health status. A CAT score over 10 suggests significant symptoms.  A worsening CAT score could be explained by an exacerbation, poor medication adherence, poor inhaler technique, or progression of COPD or comorbid conditions.  CAT MCID is 2 points  mMRC: mMRC (Modified Medical Research Council) Dyspnea Scale is used to assess the degree of baseline functional disability in patients of respiratory disease due to dyspnea. No minimal important difference is established. A decrease in score of 1 point or greater is considered a positive change.   Pulmonary Function Assessment:   Exercise Target Goals: Exercise Program Goal: Individual exercise prescription set using results from initial 6 min walk test and THRR while  considering  patient's activity barriers and safety.   Exercise Prescription Goal: Initial exercise prescription builds to 30-45 minutes a day of aerobic activity, 2-3  days per week.  Home exercise guidelines will be given to patient during program as part of exercise prescription that the participant will acknowledge.  Activity Barriers & Risk Stratification:  Activity Barriers & Cardiac Risk Stratification - 03/13/22 1115       Activity Barriers & Cardiac Risk Stratification   Activity Barriers Left Knee Replacement   LTKR in 2016 does not bother him at all now   Cardiac Risk Stratification Moderate   Has diastolic heart failure and Pulmonary hypertension            6 Minute Walk:  6 Minute Walk     Row Name 03/13/22 1216         6 Minute Walk   Phase Initial     Distance 1110 feet     Walk Time 6 minutes     # of Rest Breaks 1  4:10-4:40     MPH 2.1     METS 2.3     RPE 12     Perceived Dyspnea  2     VO2 Peak 8.05     Symptoms No     Resting HR 98 bpm     Resting BP 114/70     Resting Oxygen Saturation  95 %     Exercise Oxygen Saturation  during 6 min walk 88 %     Max Ex. HR 112 bpm     Max Ex. BP 116/66     2 Minute Post BP 112/60       Interval HR   1 Minute HR 84     2 Minute HR 94     3 Minute HR 100     4 Minute HR 98     5 Minute HR 112     6 Minute HR 104     2 Minute Post HR 83     Interval Heart Rate? Yes       Interval Oxygen   Interval Oxygen? Yes     Baseline Oxygen Saturation % 95 %  2L     1 Minute Oxygen Saturation % 99 %     1 Minute Liters of Oxygen 2 L     2 Minute Oxygen Saturation % 95 %     2 Minute Liters of Oxygen 2 L     3 Minute Oxygen Saturation % 91 %     3 Minute Liters of Oxygen 2 L     4 Minute Oxygen Saturation % 88 %     4 Minute Liters of Oxygen 2 L     5 Minute Oxygen Saturation % 90 %     5 Minute Liters of Oxygen 2 L     6 Minute Oxygen Saturation % 90 %     6 Minute Liters of Oxygen 2 L     2 Minute Post  Oxygen Saturation % 98 %     2 Minute Post Liters of Oxygen 2 L              Oxygen Initial Assessment:  Oxygen Initial Assessment - 03/13/22 1150       Home Oxygen   Home Oxygen Device Home Concentrator;Portable Concentrator    Sleep Oxygen Prescription Continuous    Liters per minute 2    Home Exercise Oxygen Prescription Pulsed    Liters per minute 2    Home Resting Oxygen Prescription None    Compliance with Home Oxygen Use Yes  Initial 6 min Walk   Oxygen Used Continuous    Liters per minute 2      Program Oxygen Prescription   Program Oxygen Prescription Continuous    Liters per minute 2      Intervention   Short Term Goals To learn and exhibit compliance with exercise, home and travel O2 prescription;To learn and understand importance of monitoring SPO2 with pulse oximeter and demonstrate accurate use of the pulse oximeter.;To learn and understand importance of maintaining oxygen saturations>88%;To learn and demonstrate proper pursed lip breathing techniques or other breathing techniques. ;To learn and demonstrate proper use of respiratory medications    Long  Term Goals Exhibits compliance with exercise, home  and travel O2 prescription;Verbalizes importance of monitoring SPO2 with pulse oximeter and return demonstration;Maintenance of O2 saturations>88%;Exhibits proper breathing techniques, such as pursed lip breathing or other method taught during program session;Compliance with respiratory medication;Demonstrates proper use of MDI's             Oxygen Re-Evaluation:  Oxygen Re-Evaluation     Row Name 03/20/22 1155             Program Oxygen Prescription   Program Oxygen Prescription Continuous       Liters per minute 2         Home Oxygen   Home Oxygen Device Home Concentrator;Portable Concentrator       Sleep Oxygen Prescription Continuous       Liters per minute 2       Home Exercise Oxygen Prescription Pulsed       Liters per minute 2        Home Resting Oxygen Prescription None       Compliance with Home Oxygen Use Yes         Goals/Expected Outcomes   Short Term Goals To learn and exhibit compliance with exercise, home and travel O2 prescription;To learn and understand importance of monitoring SPO2 with pulse oximeter and demonstrate accurate use of the pulse oximeter.;To learn and understand importance of maintaining oxygen saturations>88%;To learn and demonstrate proper pursed lip breathing techniques or other breathing techniques. ;To learn and demonstrate proper use of respiratory medications       Long  Term Goals Exhibits compliance with exercise, home  and travel O2 prescription;Verbalizes importance of monitoring SPO2 with pulse oximeter and return demonstration;Maintenance of O2 saturations>88%;Exhibits proper breathing techniques, such as pursed lip breathing or other method taught during program session;Compliance with respiratory medication;Demonstrates proper use of MDI's       Goals/Expected Outcomes Compliance and understanding of oxygen saturation monitoring and breathing techniques to decrease shortness of breath.                Oxygen Discharge (Final Oxygen Re-Evaluation):  Oxygen Re-Evaluation - 03/20/22 1155       Program Oxygen Prescription   Program Oxygen Prescription Continuous    Liters per minute 2      Home Oxygen   Home Oxygen Device Home Concentrator;Portable Concentrator    Sleep Oxygen Prescription Continuous    Liters per minute 2    Home Exercise Oxygen Prescription Pulsed    Liters per minute 2    Home Resting Oxygen Prescription None    Compliance with Home Oxygen Use Yes      Goals/Expected Outcomes   Short Term Goals To learn and exhibit compliance with exercise, home and travel O2 prescription;To learn and understand importance of monitoring SPO2 with pulse oximeter and demonstrate accurate use of the pulse  oximeter.;To learn and understand importance of maintaining oxygen  saturations>88%;To learn and demonstrate proper pursed lip breathing techniques or other breathing techniques. ;To learn and demonstrate proper use of respiratory medications    Long  Term Goals Exhibits compliance with exercise, home  and travel O2 prescription;Verbalizes importance of monitoring SPO2 with pulse oximeter and return demonstration;Maintenance of O2 saturations>88%;Exhibits proper breathing techniques, such as pursed lip breathing or other method taught during program session;Compliance with respiratory medication;Demonstrates proper use of MDI's    Goals/Expected Outcomes Compliance and understanding of oxygen saturation monitoring and breathing techniques to decrease shortness of breath.             Initial Exercise Prescription:  Initial Exercise Prescription - 03/13/22 1200       Date of Initial Exercise RX and Referring Provider   Date 03/13/22    Referring Provider Elsworth Soho    Expected Discharge Date 05/16/22      Oxygen   Oxygen Continuous    Liters 2    Maintain Oxygen Saturation 88% or higher      NuStep   Level 2    SPM 80    Minutes 15      Track   Minutes 15    METs 2.3      Prescription Details   Frequency (times per week) 2    Duration Progress to 30 minutes of continuous aerobic without signs/symptoms of physical distress      Intensity   THRR 40-80% of Max Heartrate 54-109    Ratings of Perceived Exertion 11-13    Perceived Dyspnea 0-4      Progression   Progression Continue progressive overload as per policy without signs/symptoms or physical distress.      Resistance Training   Training Prescription Yes    Weight blue bands    Reps 10-15             Perform Capillary Blood Glucose checks as needed.  Exercise Prescription Changes:   Exercise Prescription Changes     Row Name 03/26/22 1500             Response to Exercise   Blood Pressure (Admit) 90/50       Blood Pressure (Exercise) 102/50       Blood Pressure (Exit)  100/56       Heart Rate (Admit) 80 bpm       Heart Rate (Exercise) 107 bpm       Heart Rate (Exit) 75 bpm       Oxygen Saturation (Admit) 98 %       Oxygen Saturation (Exercise) 94 %       Oxygen Saturation (Exit) 98 %       Rating of Perceived Exertion (Exercise) 12       Perceived Dyspnea (Exercise) 1       Duration Continue with 30 min of aerobic exercise without signs/symptoms of physical distress.       Intensity THRR unchanged         Progression   Progression Continue to progress workloads to maintain intensity without signs/symptoms of physical distress.         Resistance Training   Training Prescription Yes       Weight blue bands       Reps 10-15       Time 10 Minutes         Oxygen   Oxygen Continuous       Liters 2  NuStep   Level 1       SPM 80       Minutes 30       METs 1.6         Oxygen   Maintain Oxygen Saturation 88% or higher                Exercise Comments:   Exercise Comments     Row Name 03/26/22 1222           Exercise Comments Pt completed first day of exercise. Pt exercised 30 min on the Nustep. He averaged 1.6 METs on level 1. He was encouraged to take deep strides. Tolerated well. [place] performed warmup and cooldown standing with support. He practiced sit to stand option today. Discussed with pt METS with him with fair understanding.                Exercise Goals and Review:   Exercise Goals     Row Name 03/13/22 1212 03/20/22 1152           Exercise Goals   Increase Physical Activity Yes Yes      Intervention Provide advice, education, support and counseling about physical activity/exercise needs.;Develop an individualized exercise prescription for aerobic and resistive training based on initial evaluation findings, risk stratification, comorbidities and participant's personal goals. Provide advice, education, support and counseling about physical activity/exercise needs.;Develop an individualized exercise  prescription for aerobic and resistive training based on initial evaluation findings, risk stratification, comorbidities and participant's personal goals.      Expected Outcomes Short Term: Attend rehab on a regular basis to increase amount of physical activity.;Long Term: Add in home exercise to make exercise part of routine and to increase amount of physical activity.;Long Term: Exercising regularly at least 3-5 days a week. Short Term: Attend rehab on a regular basis to increase amount of physical activity.;Long Term: Add in home exercise to make exercise part of routine and to increase amount of physical activity.;Long Term: Exercising regularly at least 3-5 days a week.      Increase Strength and Stamina Yes Yes      Intervention Develop an individualized exercise prescription for aerobic and resistive training based on initial evaluation findings, risk stratification, comorbidities and participant's personal goals.;Provide advice, education, support and counseling about physical activity/exercise needs. Develop an individualized exercise prescription for aerobic and resistive training based on initial evaluation findings, risk stratification, comorbidities and participant's personal goals.;Provide advice, education, support and counseling about physical activity/exercise needs.      Expected Outcomes Short Term: Increase workloads from initial exercise prescription for resistance, speed, and METs.;Short Term: Perform resistance training exercises routinely during rehab and add in resistance training at home;Long Term: Improve cardiorespiratory fitness, muscular endurance and strength as measured by increased METs and functional capacity (6MWT) Short Term: Increase workloads from initial exercise prescription for resistance, speed, and METs.;Short Term: Perform resistance training exercises routinely during rehab and add in resistance training at home;Long Term: Improve cardiorespiratory fitness, muscular  endurance and strength as measured by increased METs and functional capacity (6MWT)      Able to understand and use rate of perceived exertion (RPE) scale Yes Yes      Intervention Provide education and explanation on how to use RPE scale Provide education and explanation on how to use RPE scale      Expected Outcomes Short Term: Able to use RPE daily in rehab to express subjective intensity level;Long Term:  Able to use RPE to guide  intensity level when exercising independently Short Term: Able to use RPE daily in rehab to express subjective intensity level;Long Term:  Able to use RPE to guide intensity level when exercising independently      Able to understand and use Dyspnea scale Yes Yes      Intervention Provide education and explanation on how to use Dyspnea scale Provide education and explanation on how to use Dyspnea scale      Expected Outcomes Short Term: Able to use Dyspnea scale daily in rehab to express subjective sense of shortness of breath during exertion;Long Term: Able to use Dyspnea scale to guide intensity level when exercising independently Short Term: Able to use Dyspnea scale daily in rehab to express subjective sense of shortness of breath during exertion;Long Term: Able to use Dyspnea scale to guide intensity level when exercising independently      Knowledge and understanding of Target Heart Rate Range (THRR) Yes Yes      Intervention Provide education and explanation of THRR including how the numbers were predicted and where they are located for reference Provide education and explanation of THRR including how the numbers were predicted and where they are located for reference      Expected Outcomes Short Term: Able to state/look up THRR;Long Term: Able to use THRR to govern intensity when exercising independently;Short Term: Able to use daily as guideline for intensity in rehab Short Term: Able to state/look up THRR;Long Term: Able to use THRR to govern intensity when exercising  independently;Short Term: Able to use daily as guideline for intensity in rehab      Understanding of Exercise Prescription Yes Yes      Intervention Provide education, explanation, and written materials on patient's individual exercise prescription Provide education, explanation, and written materials on patient's individual exercise prescription      Expected Outcomes Short Term: Able to explain program exercise prescription;Long Term: Able to explain home exercise prescription to exercise independently Short Term: Able to explain program exercise prescription;Long Term: Able to explain home exercise prescription to exercise independently               Exercise Goals Re-Evaluation :  Exercise Goals Re-Evaluation     Row Name 03/20/22 1153             Exercise Goal Re-Evaluation   Exercise Goals Review Increase Physical Activity;Increase Strength and Stamina;Able to understand and use rate of perceived exertion (RPE) scale;Able to understand and use Dyspnea scale;Knowledge and understanding of Target Heart Rate Range (THRR);Understanding of Exercise Prescription       Comments Xue was scheduled to start exercise this week. Notes show that he has been in the hospital. Will continue to monitor.       Expected Outcomes Through exercise at rehab and home, the patient will decrease shortness of breath with daily activities and feel confident in carrying out an exercise regimen at home.                Discharge Exercise Prescription (Final Exercise Prescription Changes):  Exercise Prescription Changes - 03/26/22 1500       Response to Exercise   Blood Pressure (Admit) 90/50    Blood Pressure (Exercise) 102/50    Blood Pressure (Exit) 100/56    Heart Rate (Admit) 80 bpm    Heart Rate (Exercise) 107 bpm    Heart Rate (Exit) 75 bpm    Oxygen Saturation (Admit) 98 %    Oxygen Saturation (Exercise) 94 %  Oxygen Saturation (Exit) 98 %    Rating of Perceived Exertion (Exercise)  12    Perceived Dyspnea (Exercise) 1    Duration Continue with 30 min of aerobic exercise without signs/symptoms of physical distress.    Intensity THRR unchanged      Progression   Progression Continue to progress workloads to maintain intensity without signs/symptoms of physical distress.      Resistance Training   Training Prescription Yes    Weight blue bands    Reps 10-15    Time 10 Minutes      Oxygen   Oxygen Continuous    Liters 2      NuStep   Level 1    SPM 80    Minutes 30    METs 1.6      Oxygen   Maintain Oxygen Saturation 88% or higher             Nutrition:  Target Goals: Understanding of nutrition guidelines, daily intake of sodium <1532m, cholesterol <2066m calories 30% from fat and 7% or less from saturated fats, daily to have 5 or more servings of fruits and vegetables.  Biometrics:  Pre Biometrics - 03/13/22 1030       Pre Biometrics   Grip Strength 23 kg              Nutrition Therapy Plan and Nutrition Goals:  Nutrition Therapy & Goals - 03/26/22 1153       Nutrition Therapy   Diet Heart Healthy Diet    Drug/Food Interactions Statins/Certain Fruits    Protein (specify units) 99g-124g/day (1.2-1.5g/kg actual body weight)      Personal Nutrition Goals   Nutrition Goal Patient to consume a daily variety of fruit, vegetables, lean protein/plant protein, whole grains, and dairy as part of balanced diet.    Personal Goal #2 Patient to understand strategies for weight gain of 0.5-2.0# per week of weight gain.    Comments Patient with medical history of protein calorie malnutrition. Patient recently hospitalized with sepsis for acute renal failure. Palliative care and PCP are both monitoring weight loss. Weight on 03/07/2021 was 231#. He is eating three meals per day and using Premier, Boost, or Ensure 1x per day; encouraged patient to increase to two nutrition supplements daily. He lives at home with is his wife whLeron Croakoes the majority of  the grocery shopping and cooking.      Intervention Plan   Intervention Prescribe, educate and counsel regarding individualized specific dietary modifications aiming towards targeted core components such as weight, hypertension, lipid management, diabetes, heart failure and other comorbidities.    Expected Outcomes Short Term Goal: Understand basic principles of dietary content, such as calories, fat, sodium, cholesterol and nutrients.;Long Term Goal: Adherence to prescribed nutrition plan.             Nutrition Assessments:  Nutrition Assessments - 03/26/22 1621       Rate Your Plate Scores   Pre Score 37            MEDIFICTS Score Key: ?70 Need to make dietary changes  40-70 Heart Healthy Diet ? 40 Therapeutic Level Cholesterol Diet  Flowsheet Row PULMONARY REHAB CHRONIC OBSTRUCTIVE PULMONARY DISEASE [facility] [facility] [place] on [place] 37      Picture Your Plate Scores: <4<40nhealthy dietary pattern with much room for improvement. 41-50 Dietary pattern unlikely to meet recommendations for good health and room for improvement.  51-60 More healthful dietary pattern, with some room for improvement.  >62 Healthy dietary pattern, although there may be some specific behaviors that could be improved.    Nutrition Goals Re-Evaluation:  Nutrition Goals Re-Evaluation     [place] Name 03/26/22 1153             Goals   Current Weight 173 lb 8 oz (78.7 kg)       Expected Outcome Patient with medical history of protein calorie malnutrition. Patient recently hospitalized with sepsis for acute renal failure. Palliative care and PCP are both monitoring weight loss. Weight on 03/07/2021 was 231#. He is eating three meals per day and using Premier, Boost, or Ensure 1x per day; encouraged patient to increase to two nutrition supplements daily. He lives at home with is his wife Leron Croak does the majority of the grocery  shopping and cooking.                Nutrition Goals Discharge (Final Nutrition Goals Re-Evaluation):  Nutrition Goals Re-Evaluation - 03/26/22 1153       Goals   Current Weight 173 lb 8 oz (78.7 kg)    Expected Outcome Patient with medical history of protein calorie malnutrition. Patient recently hospitalized with sepsis for acute renal failure. Palliative care and PCP are both monitoring weight loss. Weight on 03/07/2021 was 231#. He is eating three meals per day and using Premier, Boost, or Ensure 1x per day; encouraged patient to increase to two nutrition supplements daily. He lives at home with is his wife Leron Croak does the majority of the grocery shopping and cooking.             Psychosocial: Target Goals: Acknowledge presence or absence of significant depression and/or stress, maximize coping skills, provide positive support system. Participant is able to verbalize types and ability to use techniques and skills needed for reducing stress and depression.  Initial Review & Psychosocial Screening:  Initial Psych Review & Screening - 03/13/22 1237       Initial Review   Current issues with None Identified      Family Dynamics   Good Support System? Yes   Wife, son, daughter, grandchildren annd 1 great grandchild.     Barriers   Psychosocial barriers to participate in program There are no identifiable barriers or psychosocial needs.      Screening Interventions   Interventions Encouraged to exercise    Expected Outcomes Short Term goal: Identification and review with participant of any Quality of Life or Depression concerns found by scoring the questionnaire.;Short Term goal: Utilizing psychosocial counselor, staff and physician to assist with identification of specific Stressors or current issues interfering with healing process. Setting desired goal for each stressor or current issue identified.             Quality of Life Scores:  Scores of 19 and below usually  indicate a poorer quality of life in these areas.  A difference of  2-3 points is a clinically meaningful difference.  A difference of 2-3 points in the total score of the Quality of Life Index has been associated with significant improvement in overall quality of life, self-image, physical symptoms, and general health in studies assessing change in quality of life.  PHQ-9: Review Flowsheet       03/13/2022 05/09/2021 03/03/2018 11/03/2017  Depression screen PHQ 2/9  Decreased Interest 0 0 0 0  Down, Depressed, Hopeless 0 0 0 0  PHQ - 2 Score 0 0 0 0  Altered sleeping 1 - 0 -  Tired, decreased energy 1 - 1 -  Change in appetite 0 - 0 -  Feeling bad or failure about yourself  0 - - -  Trouble concentrating 0 - 0 -  Moving slowly or fidgety/restless 0 - 0 -  Suicidal thoughts 0 - 0 -  PHQ-9 Score 2 - 1 -  Difficult doing work/chores Not difficult at all - Not difficult at all -   Interpretation of Total Score  Total Score Depression Severity:  1-4 = Minimal depression, 5-9 = Mild depression, 10-14 = Moderate depression, 15-19 = Moderately severe depression, 20-27 = Severe depression   Psychosocial Evaluation and Intervention:  Psychosocial Evaluation - 03/13/22 1240       Psychosocial Evaluation & Interventions   Interventions Encouraged to exercise with the program and follow exercise prescription    Expected Outcomes For Truxton to attend the PR program without any psychosocial issues or concerns.    Continue Psychosocial Services  Follow up required by staff             Psychosocial Re-Evaluation:  Psychosocial Re-Evaluation     [place] Name 03/20/22 1649             Psychosocial Re-Evaluation   Current issues with None Identified       Comments Selig was scheduled to start the PR program 03/19/22. He was admitted to the hospital 03/16/22 and is currently still in the hospial. We will continue to monitor his condition and suitation       Interventions Encouraged to attend  Pulmonary Rehabilitation for the exercise       Continue Psychosocial Services  Follow up required by staff                Psychosocial Discharge (Final Psychosocial Re-Evaluation):  Psychosocial Re-Evaluation - 03/20/22 1649       Psychosocial Re-Evaluation   Current issues with None Identified    Comments Ramel was scheduled to start the PR program 03/19/22. He was admitted to the hospital 03/16/22 and is currently still in the hospial. We will continue to monitor his condition and suitation    Interventions Encouraged to attend Pulmonary Rehabilitation for the exercise    Continue Psychosocial Services  Follow up required by staff             Education: Education Goals: Education classes will be provided on a weekly basis, covering required topics. Participant will state understanding/return demonstration of topics presented.  Learning Barriers/Preferences:   Education Topics: Risk Factor Reduction:  -Group instruction that is supported by a PowerPoint presentation. Instructor discusses the definition of a risk factor, different risk factors for pulmonary disease, and how the heart and lungs work together.     Nutrition for Pulmonary Patient:  -Group instruction provided by PowerPoint slides, verbal discussion, and written materials to support subject matter. The instructor gives an explanation and review of healthy diet recommendations, which includes a discussion on weight management, recommendations for fruit and vegetable consumption, as well as protein, fluid, caffeine, fiber, sodium, sugar, and alcohol. Tips for eating when patients are short of breath are discussed. Flowsheet Row PULMONARY REHAB OTHER RESPIRATORY from 02/26/2018 in [place]  Date 01/01/18  Educator edna  Instruction Review Code 2- Demonstrated Understanding       Pursed Lip Breathing:  -Group instruction that is supported by demonstration and informational  handouts. Instructor discusses the benefits of pursed lip and diaphragmatic breathing and  detailed demonstration on how to preform both.     Oxygen Safety:  -Group instruction provided by PowerPoint, verbal discussion, and written material to support subject matter. There is an overview of "What is Oxygen" and "Why do we need it".  Instructor also reviews how to create a safe environment for oxygen use, the importance of using oxygen as prescribed, and the risks of noncompliance. There is a brief discussion on traveling with oxygen and resources the patient may utilize. Flowsheet Row PULMONARY REHAB OTHER RESPIRATORY from 02/26/2018 in [place]  Date 12/04/17  Educator Lucianne Lei [place]  Instruction Review Code 2- Demonstrated Understanding       Oxygen Equipment:  -Group instruction provided by [facility] Staff utilizing handouts, written materials, and equipment demonstrations. Flowsheet Row PULMONARY REHAB OTHER RESPIRATORY from 02/26/2018 in [place]  Date 12/18/17  Educator [place]  Instruction Review Code 2- Demonstrated Understanding       Signs and Symptoms:  -Group instruction provided by written material and verbal discussion to support subject matter. Warning signs and symptoms of infection, stroke, and heart attack are reviewed and when to call the physician/911 reinforced. Tips for preventing the spread of infection discussed. Flowsheet Row PULMONARY REHAB OTHER RESPIRATORY from 02/26/2018 in [place]  Date 02/26/18  Educator Remo Lipps  Instruction Review Code 1- Verbalizes Understanding       Advanced Directives:  -Group instruction provided by verbal instruction and written material to support subject matter. Instructor reviews Advanced Directive laws and proper instruction for filling out document.   Pulmonary Video:  -Group video education that reviews the  importance of medication and oxygen compliance, exercise, good nutrition, pulmonary hygiene, and pursed lip and diaphragmatic breathing for the pulmonary patient.   Exercise for the Pulmonary Patient:  -Group instruction that is supported by a PowerPoint presentation. Instructor discusses benefits of exercise, core components of exercise, frequency, duration, and intensity of an exercise routine, importance of utilizing pulse oximetry during exercise, safety while exercising, and options of places to exercise outside of rehab.   Flowsheet Row PULMONARY REHAB OTHER RESPIRATORY from 02/26/2018 in [place]  Date 12/25/17  Educator EP  Instruction Review Code 1- Verbalizes Understanding       Pulmonary Medications:  -Verbally interactive group education provided by instructor with focus on inhaled medications and proper administration.   Anatomy and Physiology of the Respiratory System and Intimacy:  -Group instruction provided by PowerPoint, verbal discussion, and written material to support subject matter. Instructor reviews respiratory cycle and anatomical components of the respiratory system and their functions. Instructor also reviews differences in obstructive and restrictive respiratory diseases with examples of each. Intimacy, Sex, and Sexuality differences are reviewed with a discussion on how relationships can change when diagnosed with pulmonary disease. Common sexual concerns are reviewed. Flowsheet Row PULMONARY REHAB OTHER RESPIRATORY from 02/26/2018 in [place]  Date 01/15/18  Educator rn  Instruction Review Code 2- Demonstrated Understanding       MD DAY -A group question and answer session with a medical doctor that allows participants to ask questions that relate to their pulmonary disease state. Flowsheet Row PULMONARY REHAB OTHER RESPIRATORY from 02/26/2018 in [place]   Date 01/22/18  Educator Dr. Nelda Marseille  Instruction Review Code 1- Verbalizes Understanding       OTHER EDUCATION -Group or individual verbal, written, or video instructions  that support the educational goals of the pulmonary rehab program. [place] OTHER RESPIRATORY from 02/26/2018 in [place]  Date 02/12/18  Educator [place]  Instruction Review Code 1- Verbalizes Understanding       Holiday Eating Survival Tips:  -Group instruction provided by PowerPoint slides, verbal discussion, and written materials to support subject matter. The instructor gives patients tips, tricks, and techniques to help them not only survive but enjoy the holidays despite the onslaught of food that accompanies the holidays.   Knowledge Questionnaire Score:  Knowledge Questionnaire Score - 03/13/22 1201       Knowledge Questionnaire Score   Pre Score 18/18             Core Components/Risk Factors/Patient Goals at Admission:  Personal Goals and Risk Factors at Admission - 03/13/22 1244       Core Components/Risk Factors/Patient Goals on Admission    Weight Management Weight Loss   Since 04/2019 when pt was hospitalized he has lost 62 pounds. He is concerned about this but they have not determined any medical reasons for this.He states the last month he has lost 6 pounds.   Improve shortness of breath with ADL's Yes    Intervention Provide education, individualized exercise plan and daily activity instruction to help decrease symptoms of SOB with activities of daily living.    Expected Outcomes Short Term: Improve cardiorespiratory fitness to achieve a reduction of symptoms when performing ADLs;Long Term: Be able to perform more ADLs without symptoms or delay the onset of symptoms    Increase knowledge of respiratory medications and ability to use respiratory devices properly  Yes    Intervention Provide  education and demonstration as needed of appropriate use of medications, inhalers, and oxygen therapy.    Expected Outcomes Short Term: Achieves understanding of medications use. Understands that oxygen is a medication prescribed by physician. Demonstrates appropriate use of inhaler and oxygen therapy.;Long Term: Maintain appropriate use of medications, inhalers, and oxygen therapy.             Core Components/Risk Factors/Patient Goals Review:   Goals and Risk Factor Review     Row Name 03/20/22 1718             Core Components/Risk Factors/Patient Goals Review   Personal Goals Review Improve shortness of breath with ADL's;Develop more efficient breathing techniques such as purse lipped breathing and diaphragmatic breathing and practicing self-pacing with activity.;Increase knowledge of respiratory medications and ability to use respiratory devices properly.;Other       Review Pt has not started the progam yet due to being hosptialized currentlly. Will continue to mointer weight loss.       Expected Outcomes See admission goals.                Core Components/Risk Factors/Patient Goals at Discharge (Final Review):   Goals and Risk Factor Review - 03/20/22 1718       Core Components/Risk Factors/Patient Goals Review   Personal Goals Review Improve shortness of breath with ADL's;Develop more efficient breathing techniques such as purse lipped breathing and diaphragmatic breathing and practicing self-pacing with activity.;Increase knowledge of respiratory medications and ability to use respiratory devices properly.;Other    Review Pt has not started the progam yet due to being hosptialized currentlly. Will continue to mointer weight loss.    Expected Outcomes See admission goals.  ITP Comments: Matther has attended 1 of the PR sessions.   Comments: Dr. Rodman Pickle is Medical Director for Pulmonary Rehab at [facility].

## 2022-03-28 ENCOUNTER — Encounter (HOSPITAL_COMMUNITY)
Admission: RE | Admit: 2022-03-28 | Discharge: 2022-03-28 | Disposition: A | Discharge status: Home / Self Care | Payer: Medicare Other | Source: Ambulatory Visit | Attending: Pulmonary Disease | Admitting: Pulmonary Disease

## 2022-03-28 VITALS — Wt 175.3 lb

## 2022-03-28 DIAGNOSIS — J449 Chronic obstructive pulmonary disease, unspecified: Secondary | ICD-10-CM

## 2022-03-28 NOTE — Progress Notes (Signed)
Daily Session Note  Patient Details  Name: Joshua [name] MRN: [id number] Date of Birth: 1935-10-26 Referring Provider:   April Manson Pulmonary Rehab Walk Test from 03/13/2022 in [place]  Referring Provider Elsworth Soho       Encounter Date: 03/28/2022  Check In:  Session Check In - 03/28/22 1152       Check-In   Supervising physician immediately available to respond to emergencies [facility] immediately available    Physician(s) Dr. Posey Pronto    Location [facility]    Staff Present Rosebud Poles, RN, BSN;Anali Cabanilla Olen Cordial BS, ACSM-CEP, Exercise Physiologist;Kaylee Rosana Hoes, MS, ACSM-CEP, Exercise Physiologist;Lisa Ysidro Evert, RN    Virtual Visit No    Medication changes reported     No    Fall or balance concerns reported    No    Tobacco Cessation No Change    Warm-up and Cool-down Performed as group-led instruction    Resistance Training Performed Yes    VAD Patient? No    PAD/SET Patient? No      Pain Assessment   Currently in Pain? No/denies    Multiple Pain Sites No             Capillary Blood Glucose: No results found for this or any previous visit (from the past 24 hour(s)).   Exercise Prescription Changes - 03/28/22 1100       Resistance Training   Training Prescription Yes    Weight change to red bands    Reps 10-15    Time 10 Minutes             Social History   Tobacco Use  Smoking Status Former   Packs/day: 1.00   Years: 50.00   Total pack years: 50.00   Types: Cigarettes   Start date: 69   Quit date: 08/19/2006   Years since quitting: 15.6  Smokeless Tobacco Never  Tobacco Comments   Former smoke 03/15/22    Goals Met:  Exercise tolerated well No report of concerns or symptoms today Strength training completed today  Goals Unmet:  Not Applicable  Comments: Service time is from 1019 to 1135.    Dr. Rodman Pickle is Medical Director for Pulmonary Rehab at [facility].

## 2022-04-01 DIAGNOSIS — Z09 Encounter for follow-up examination after completed treatment for conditions other than malignant neoplasm: Secondary | ICD-10-CM | POA: Diagnosis not present

## 2022-04-01 DIAGNOSIS — R112 Nausea with vomiting, unspecified: Secondary | ICD-10-CM | POA: Diagnosis not present

## 2022-04-01 DIAGNOSIS — E7800 Pure hypercholesterolemia, unspecified: Secondary | ICD-10-CM | POA: Diagnosis not present

## 2022-04-01 DIAGNOSIS — J9610 Chronic respiratory failure, unspecified whether with hypoxia or hypercapnia: Secondary | ICD-10-CM | POA: Diagnosis not present

## 2022-04-01 DIAGNOSIS — N1832 Chronic kidney disease, stage 3b: Secondary | ICD-10-CM | POA: Diagnosis not present

## 2022-04-01 DIAGNOSIS — N1831 Chronic kidney disease, stage 3a: Secondary | ICD-10-CM | POA: Diagnosis not present

## 2022-04-01 DIAGNOSIS — I2510 Atherosclerotic heart disease of native coronary artery without angina pectoris: Secondary | ICD-10-CM | POA: Diagnosis not present

## 2022-04-01 DIAGNOSIS — M109 Gout, unspecified: Secondary | ICD-10-CM | POA: Diagnosis not present

## 2022-04-01 DIAGNOSIS — J449 Chronic obstructive pulmonary disease, unspecified: Secondary | ICD-10-CM | POA: Diagnosis not present

## 2022-04-01 DIAGNOSIS — I272 Pulmonary hypertension, unspecified: Secondary | ICD-10-CM | POA: Diagnosis not present

## 2022-04-01 DIAGNOSIS — I5032 Chronic diastolic (congestive) heart failure: Secondary | ICD-10-CM | POA: Diagnosis not present

## 2022-04-01 DIAGNOSIS — R748 Abnormal levels of other serum enzymes: Secondary | ICD-10-CM | POA: Diagnosis not present

## 2022-04-01 DIAGNOSIS — J690 Pneumonitis due to inhalation of food and vomit: Secondary | ICD-10-CM | POA: Diagnosis not present

## 2022-04-01 DIAGNOSIS — R7309 Other abnormal glucose: Secondary | ICD-10-CM | POA: Diagnosis not present

## 2022-04-01 DIAGNOSIS — Z0000 Encounter for general adult medical examination without abnormal findings: Secondary | ICD-10-CM | POA: Diagnosis not present

## 2022-04-02 ENCOUNTER — Encounter (HOSPITAL_COMMUNITY)
Admission: RE | Admit: 2022-04-02 | Discharge: 2022-04-02 | Disposition: A | Discharge status: Home / Self Care | Payer: Medicare Other | Source: Ambulatory Visit | Attending: Pulmonary Disease | Admitting: Pulmonary Disease

## 2022-04-02 DIAGNOSIS — J449 Chronic obstructive pulmonary disease, unspecified: Secondary | ICD-10-CM

## 2022-04-02 NOTE — Progress Notes (Signed)
Daily Session Note  Patient Details  Name: Joshua [name] MRN: [id number] Date of Birth: [birth year] Referring Provider:   April Manson Pulmonary Rehab Walk Test from 03/13/2022 in [place]  Referring Provider Elsworth Soho       Encounter Date: 04/02/2022  Check In:  Session Check In - 04/02/22 1158       Check-In   Supervising physician immediately available to respond to emergencies [facility] immediately available    Physician(s) Dr. Posey Pronto    Location [facility]    Staff Present Sandy Salaam, MS, Exercise Physiologist;Carlette Wilber Oliphant, RN, BSN;Lisa Ysidro Evert, Cathleen Fears, MS, ACSM-CEP, Exercise Physiologist    Virtual Visit No    Medication changes reported     No    Fall or balance concerns reported    No    Tobacco Cessation No Change    Warm-up and Cool-down Performed as group-led instruction    Resistance Training Performed Yes    VAD Patient? No    PAD/SET Patient? No      Pain Assessment   Currently in Pain? No/denies    Multiple Pain Sites No             Capillary Blood Glucose: No results found for this or any previous visit (from the past 24 hour(s)).    Social History   Tobacco Use  Smoking Status Former   Packs/day: 1.00   Years: 50.00   Total pack years: 50.00   Types: Cigarettes   Start date: 28   Quit date: 08/19/2006   Years since quitting: 15.6  Smokeless Tobacco Never  Tobacco Comments   Former smoke 03/15/22    Goals Met:  Exercise tolerated well No report of concerns or symptoms today Strength training completed today  Goals Unmet:  Not Applicable  Comments: Service time is from 1013 to 1145.    Dr. Rodman Pickle is Medical Director for Pulmonary Rehab at [facility].

## 2022-04-04 ENCOUNTER — Encounter (HOSPITAL_COMMUNITY)
Admission: RE | Admit: 2022-04-04 | Discharge: 2022-04-04 | Disposition: A | Discharge status: Home / Self Care | Payer: Medicare Other | Source: Ambulatory Visit | Attending: Pulmonary Disease | Admitting: Pulmonary Disease

## 2022-04-04 DIAGNOSIS — J449 Chronic obstructive pulmonary disease, unspecified: Secondary | ICD-10-CM

## 2022-04-04 NOTE — Progress Notes (Signed)
Daily Session Note  Patient Details  Name: Joshua [name] MRN: [id number] Date of Birth: Feb 03, 1936 Referring Provider:   April Manson Pulmonary Rehab Walk Test from 03/13/2022 in [place]  Referring Provider Elsworth Soho       Encounter Date: 04/04/2022  Check In:  Session Check In - 04/04/22 1133       Check-In   Supervising physician immediately available to respond to emergencies [facility] immediately available    Physician(s) Dr. Lonny Prude    [facility]    Staff Present Rosebud Poles, RN, BSN;Ramon Dredge, RN, Fernande Bras, MS, ACSM-CEP, Exercise Physiologist;Lisa Ysidro Evert, RN    Virtual Visit No    Medication changes reported     No    Fall or balance concerns reported    No    Tobacco Cessation No Change    Warm-up and Cool-down Performed as group-led instruction    Resistance Training Performed Yes    VAD Patient? No    PAD/SET Patient? No      Pain Assessment   Multiple Pain Sites No             Capillary Blood Glucose: No results found for this or any previous visit (from the past 24 hour(s)).    Social History   Tobacco Use  Smoking Status Former   Packs/day: 1.00   Years: 50.00   Total pack years: 50.00   Types: Cigarettes   Start date: 44   Quit date: 08/19/2006   Years since quitting: 15.6  Smokeless Tobacco Never  Tobacco Comments   Former smoke 03/15/22    Goals Met:  Proper associated with RPD/PD & O2 Sat Exercise tolerated well No report of concerns or symptoms today Strength training completed today  Goals Unmet:  Not Applicable  Comments: Service time is from 1020 to 1150.    Dr. Rodman Pickle is Medical Director for Pulmonary Rehab at [facility].

## 2022-04-09 ENCOUNTER — Encounter (HOSPITAL_COMMUNITY)
Admission: RE | Admit: 2022-04-09 | Discharge: 2022-04-09 | Disposition: A | Discharge status: Home / Self Care | Payer: Medicare Other | Source: Ambulatory Visit | Attending: Pulmonary Disease | Admitting: Pulmonary Disease

## 2022-04-09 VITALS — Wt 179.5 lb

## 2022-04-09 DIAGNOSIS — J449 Chronic obstructive pulmonary disease, unspecified: Secondary | ICD-10-CM

## 2022-04-09 NOTE — Progress Notes (Signed)
Daily Session Note  Patient Details  Name: Joshua [name] MRN: [id number] Date of Birth: [birth year] Referring Provider:   April Manson Pulmonary Rehab Walk Test from 03/13/2022 in [place]  Referring Provider Elsworth Soho       Encounter Date: 04/09/2022  Check In:  Session Check In - 04/09/22 1124       Check-In   Supervising physician immediately available to respond to emergencies [facility] immediately available    Physician(s) Dr. Lonny Prude    [facility]    Staff Present Rosebud Poles, RN, BSN;Randi Olen Cordial BS, ACSM-CEP, Exercise Physiologist;Kaylee Rosana Hoes, MS, ACSM-CEP, Exercise Physiologist;Lisa Ysidro Evert, RN    Virtual Visit No    Medication changes reported     No    Fall or balance concerns reported    No    Tobacco Cessation No Change    Warm-up and Cool-down Performed as group-led instruction    Resistance Training Performed Yes    VAD Patient? No    PAD/SET Patient? No      Pain Assessment   Currently in Pain? No/denies    Multiple Pain Sites No             Capillary Blood Glucose: No results found for this or any previous visit (from the past 24 hour(s)).   Exercise Prescription Changes - 04/09/22 1200       Response to Exercise   Blood Pressure (Admit) 96/68    Blood Pressure (Exercise) 126/58    Blood Pressure (Exit) 120/70    Heart Rate (Admit) 81 bpm    Heart Rate (Exercise) 93 bpm    Heart Rate (Exit) 97 bpm    Oxygen Saturation (Admit) 99 %    Oxygen Saturation (Exercise) 91 %    Oxygen Saturation (Exit) 97 %    Rating of Perceived Exertion (Exercise) 12    Perceived Dyspnea (Exercise) 1    Duration Continue with 30 min of aerobic exercise without signs/symptoms of physical distress.    Intensity THRR unchanged      Progression   Progression Continue to progress workloads to maintain intensity without signs/symptoms of physical distress.      Resistance Training   Training  Prescription Yes    Weight red bands    Reps 10-15    Time 10 Minutes      Oxygen   Oxygen Continuous    Liters 2      NuStep   Level 1    SPM 80    Minutes 15      Track   Laps 12    Minutes 15    METs 2.39             Social History   Tobacco Use  Smoking Status Former   Packs/day: 1.00   Years: 50.00   Total pack years: 50.00   Types: Cigarettes   Start date: 28   Quit date: 08/19/2006   Years since quitting: 15.6  Smokeless Tobacco Never  Tobacco Comments   Former smoke 03/15/22    Goals Met:  Proper associated with RPD/PD & O2 Sat Exercise tolerated well No report of concerns or symptoms today Strength training completed today  Goals Unmet:  Not Applicable  Comments: Service time is from 1008 to 1138    Dr. Rodman Pickle is Medical Director for Pulmonary Rehab at [facility].

## 2022-04-11 ENCOUNTER — Encounter (HOSPITAL_COMMUNITY)
Admission: RE | Admit: 2022-04-11 | Discharge: 2022-04-11 | Disposition: A | Discharge status: Home / Self Care | Payer: Medicare Other | Source: Ambulatory Visit | Attending: Pulmonary Disease | Admitting: Pulmonary Disease

## 2022-04-11 VITALS — Wt 180.1 lb

## 2022-04-11 DIAGNOSIS — J449 Chronic obstructive pulmonary disease, unspecified: Secondary | ICD-10-CM | POA: Diagnosis not present

## 2022-04-11 NOTE — Progress Notes (Signed)
Daily Session Note  Patient Details  Name: Joshua [name] MRN: [id number] Date of Birth: October 24, 1935 Referring Provider:   April Manson Pulmonary Rehab Walk Test from 03/13/2022 in [place]  Referring Provider Elsworth Soho       Encounter Date: 04/11/2022  Check In:  Session Check In - 04/11/22 1033       Check-In   Supervising physician immediately available to respond to emergencies [facility] immediately available    Physician(s) Dr. Florene Glen    [facility]    Staff Present Rosebud Poles, RN, BSN;Randi Olen Cordial BS, ACSM-CEP, Exercise Physiologist;Kaylee Rosana Hoes, MS, ACSM-CEP, Exercise Physiologist;Revella Shelton Ysidro Evert, RN    Virtual Visit No    Medication changes reported     No    Fall or balance concerns reported    No    Tobacco Cessation No Change    Warm-up and Cool-down Performed as group-led instruction    Resistance Training Performed Yes    VAD Patient? No    PAD/SET Patient? No      Pain Assessment   Currently in Pain? No/denies    Multiple Pain Sites No             Capillary Blood Glucose: No results found for this or any previous visit (from the past 24 hour(s)).    Social History   Tobacco Use  Smoking Status Former   Packs/day: 1.00   Years: 50.00   Total pack years: 50.00   Types: Cigarettes   Start date: 23   Quit date: 08/19/2006   Years since quitting: 15.6  Smokeless Tobacco Never  Tobacco Comments   Former smoke 03/15/22    Goals Met:  Proper associated with RPD/PD & O2 Sat Exercise tolerated well No report of concerns or symptoms today Strength training completed today  Goals Unmet:  Not Applicable  Comments: Service time is from 1000 to [place]    Dr. Rodman Pickle is Medical Director for Pulmonary Rehab at [facility].

## 2022-04-12 DIAGNOSIS — R748 Abnormal levels of other serum enzymes: Secondary | ICD-10-CM | POA: Diagnosis not present

## 2022-04-12 DIAGNOSIS — I5032 Chronic diastolic (congestive) heart failure: Secondary | ICD-10-CM | POA: Diagnosis not present

## 2022-04-12 DIAGNOSIS — I2510 Atherosclerotic heart disease of native coronary artery without angina pectoris: Secondary | ICD-10-CM | POA: Diagnosis not present

## 2022-04-12 DIAGNOSIS — E7800 Pure hypercholesterolemia, unspecified: Secondary | ICD-10-CM | POA: Diagnosis not present

## 2022-04-12 DIAGNOSIS — R7309 Other abnormal glucose: Secondary | ICD-10-CM | POA: Diagnosis not present

## 2022-04-12 DIAGNOSIS — I272 Pulmonary hypertension, unspecified: Secondary | ICD-10-CM | POA: Diagnosis not present

## 2022-04-12 DIAGNOSIS — N1831 Chronic kidney disease, stage 3a: Secondary | ICD-10-CM | POA: Diagnosis not present

## 2022-04-13 IMAGING — CT CT ABD-PELV W/O CM
2 of 4 series · 15 of 46 positions shown, 17 images · non-contrast
Comparison: None.

CLINICAL DATA: Concern for bowel obstruction, vomiting for 2 days

EXAM:
CT ABDOMEN AND PELVIS WITHOUT CONTRAST
TECHNIQUE: Multidetector CT imaging of the abdomen and pelvis was performed
following the standard protocol without IV contrast.

[Series 3: ap without · axial · non-contrast · 0.78mm/px · z∈[-500,-105]mm · 12 of 89 slices shown, 14 images]
[im 5/89  soft-tissue]
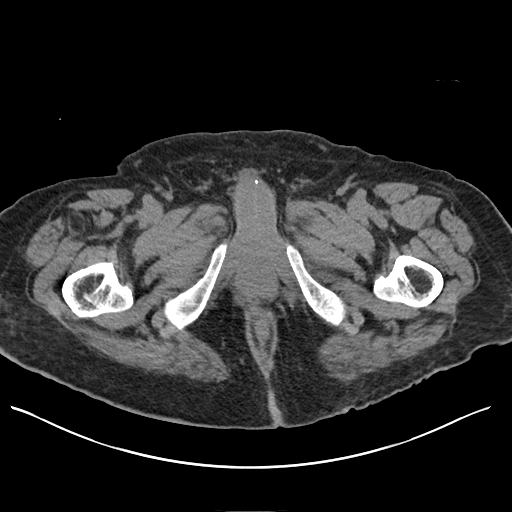
[im 5/89  bone]
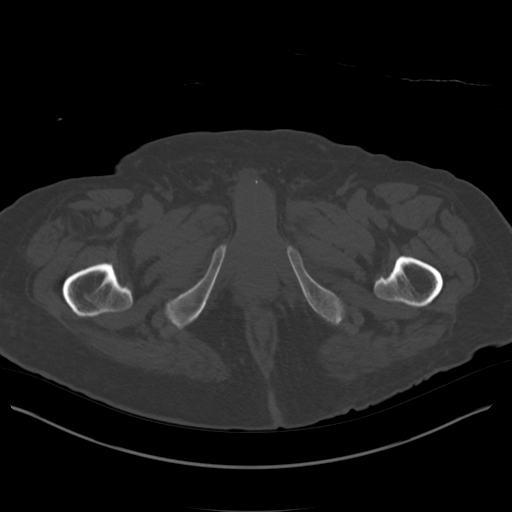
[im 13/89  soft-tissue]
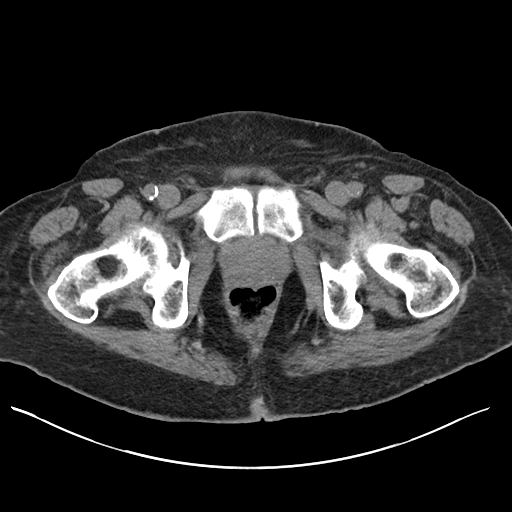
[im 21/89  soft-tissue]
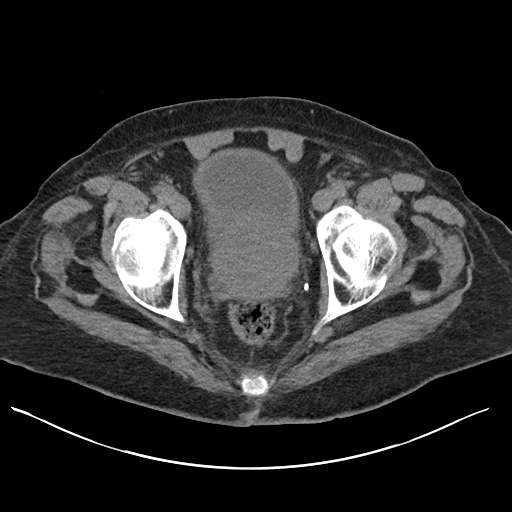
[im 26/89  soft-tissue]
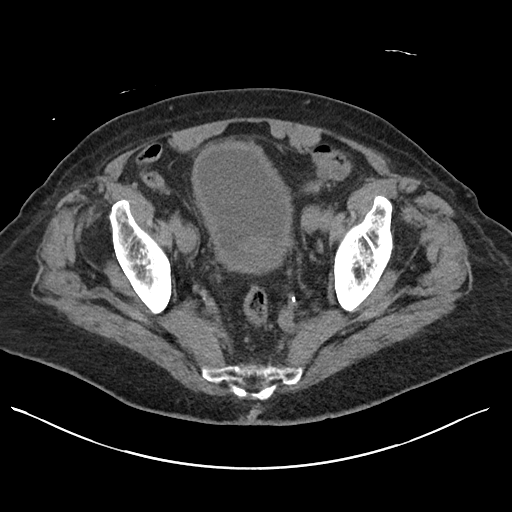
[im 34/89  soft-tissue]
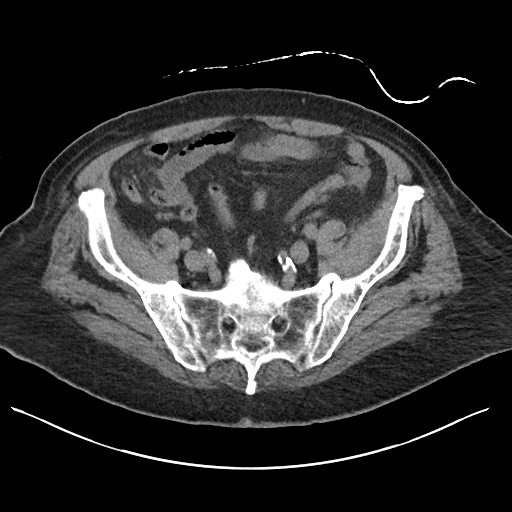
[im 42/89  soft-tissue]
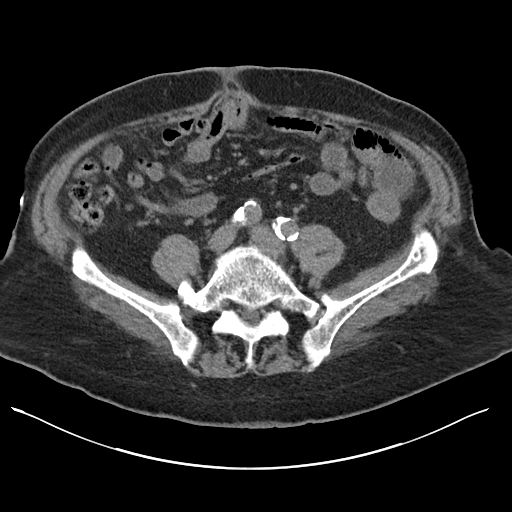
[im 47/89  soft-tissue]
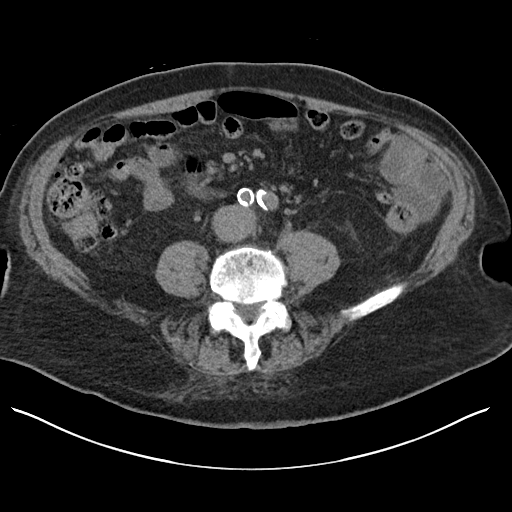
[im 55/89  soft-tissue]
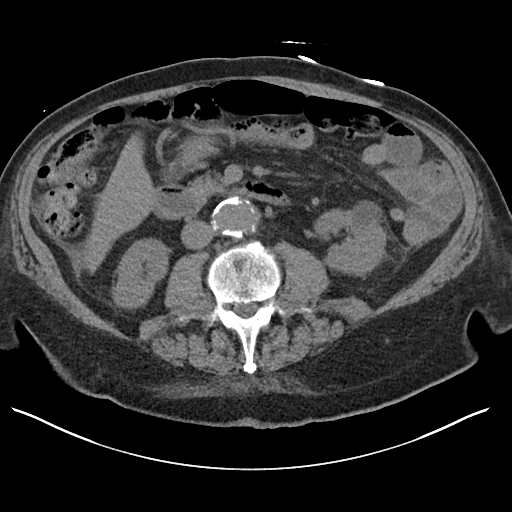
[im 63/89  soft-tissue]
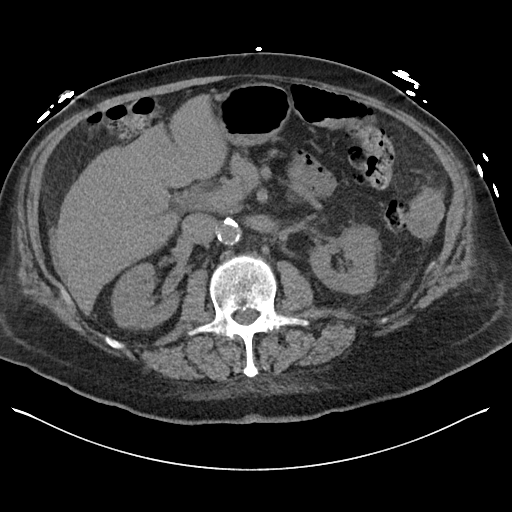
[im 63/89  bone]
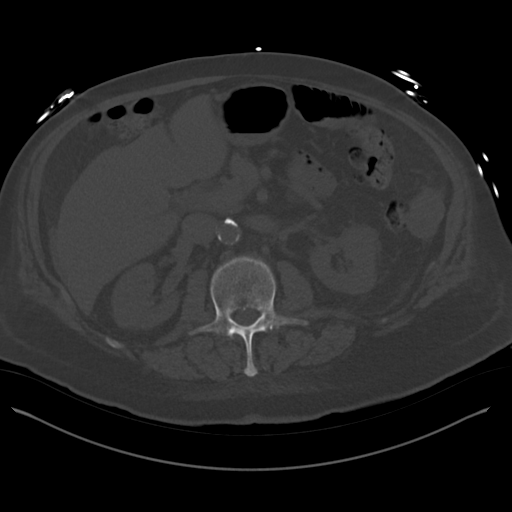
[im 68/89  soft-tissue]
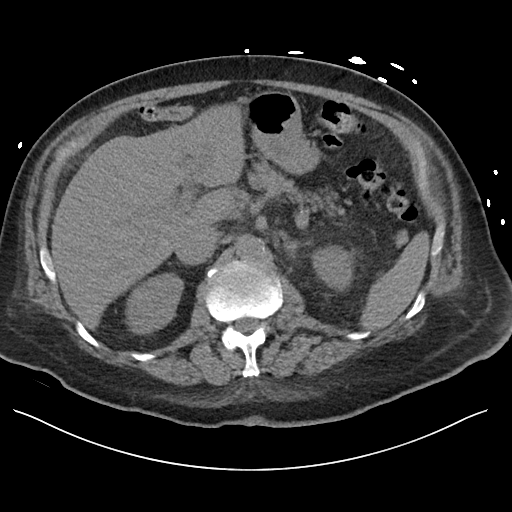
[im 76/89  soft-tissue]
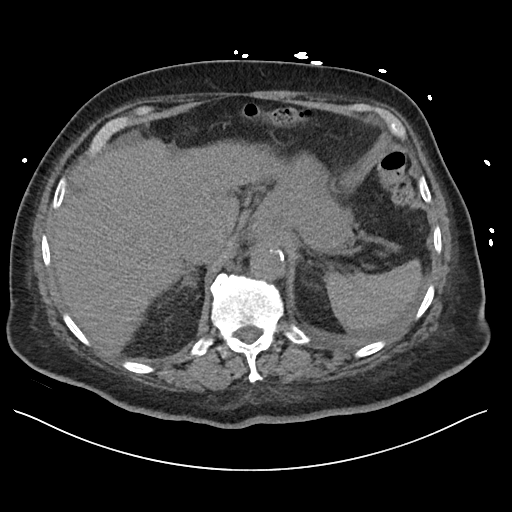
[im 84/89  soft-tissue]
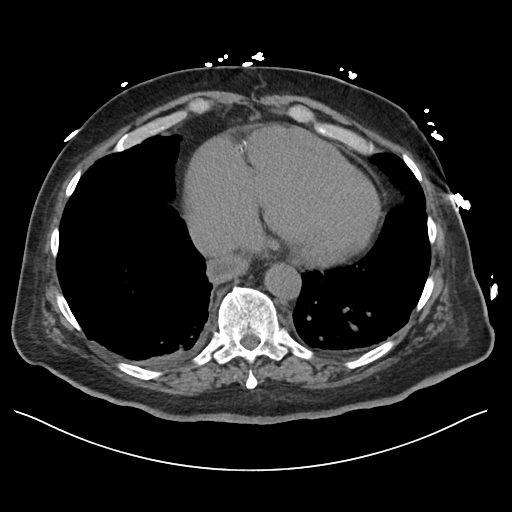

[Series 6: cor · coronal · 0.82mm/px · 3 of 103 slices shown]
[im 35/103  soft-tissue]
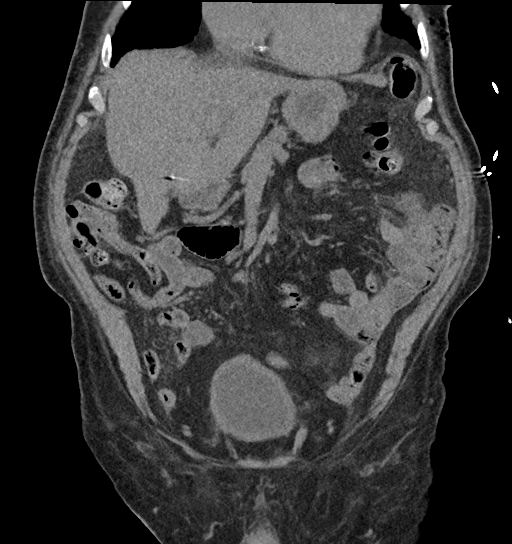
[im 46/103  soft-tissue]
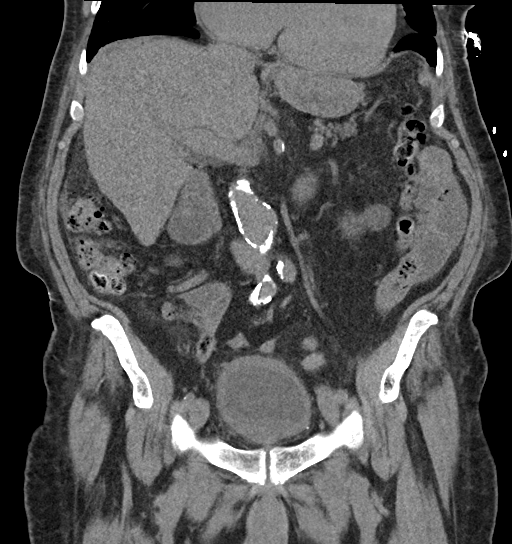
[im 57/103  soft-tissue]
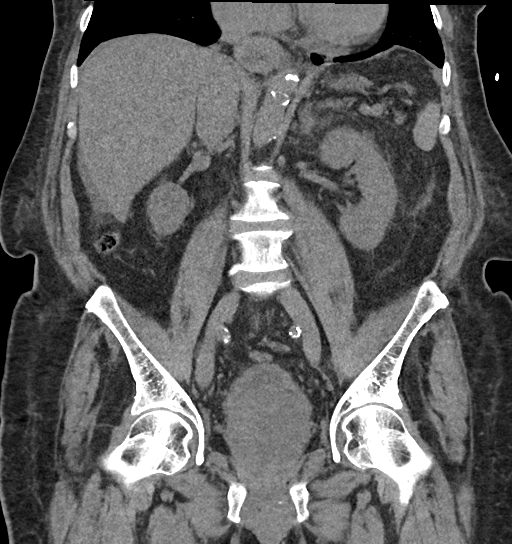

[15 of 46 positions shown; findings below may reference images not displayed]

FINDINGS: Lower chest: There is a trace right pleural effusion with bibasilar
subsegmental atelectasis. The heart is enlarged. Coronary artery
calcifications are noted. There is trace pericardial fluid.

Hepatobiliary: The liver is within normal limits, within the
confines of noncontrast technique.

Pancreas: The pancreas is atrophic but otherwise unremarkable. There
is no focal lesion or contour abnormality. There is no main
pancreatic ductal dilatation or peripancreatic inflammatory change.

Spleen: Unremarkable.

Adrenals/Urinary Tract: The adrenals are unremarkable.

There is a 2.2 cm left lower pole renal cyst. There are no other
focal lesions. There are no stones. There is no hydronephrosis or
hydroureter. The bladder wall is thickened, likely reflecting
chronic outlet obstruction.

Stomach/Bowel: There is a moderate hiatal hernia. The stomach is
otherwise unremarkable. There is dilation of the third portion of
the duodenum with marked narrowing as the duodenum passes between
the aneurysmal aorta and the SMV. Otherwise, there is no evidence of
bowel obstruction. There is no evidence of bowel wall thickening or
inflammatory change. The appendix is normal. There are scattered
colonic diverticula without evidence of acute diverticulitis.

Vascular/Lymphatic: There is extensive calcified atherosclerotic
plaque throughout the arterial vasculature. The infrarenal abdominal
aorta is aneurysmal measuring up to 3.5 cm. There is no evidence of
rupture. There is no abdominal or pelvic lymphadenopathy.

Reproductive: The prostate is enlarged measuring up to 6.4 cm and
impresses upon the inferior aspect of the bladder.

Other: There is scattered fluid in the abdomen measuring greater
than simple fluid attenuation, predominantly around the liver tip
and spleen. There is no free intraperitoneal air. There is a small
umbilical hernia containing a loop of unobstructed small bowel.

Musculoskeletal: There is mild multilevel degenerative change of the
spine. There is no acute osseous abnormality or aggressive osseous
lesion.
IMPRESSION: 1. Abrupt narrowing of the third portion of the duodenum as it
passes between the aneurysmal abdominal aorta and SMV with upstream
dilation is suggestive of nutcracker phenomenon which may account
for the patient's symptoms. No evidence of high-grade bowel
obstruction.
2. Scattered ascites throughout the abdomen measuring greater than
simple fluid attenuation. This may reflect proteinaceous debris;
however, hemoperitoneum is not excluded. No source of bleeding is
identified.
3. Infrarenal abdominal aortic aneurysm measuring up to 3.5 cm
without evidence of rupture. Recommend follow-up ultrasound every 2
years. This recommendation follows ACR consensus guidelines: White
Paper of the ACR Incidental Findings Committee II on Vascular
Findings. [HOSPITAL] 4325; [DATE].
4. Small ventral abdominal hernia containing a loop of nonobstructed
small bowel.
5. Moderate-sized hiatal hernia.
6. Thickened bladder wall likely reflecting chronic outlet
obstruction from the enlarged prostate.
7. Cardiomegaly.

## 2022-04-13 IMAGING — DX DG CHEST 1V PORT
1 series · 1 of 1 positions shown · non-contrast
Comparison: 04/12/2021

CLINICAL DATA: Vomiting.  Evaluate for aspiration

EXAM:
PORTABLE CHEST 1 VIEW

[chest ap]
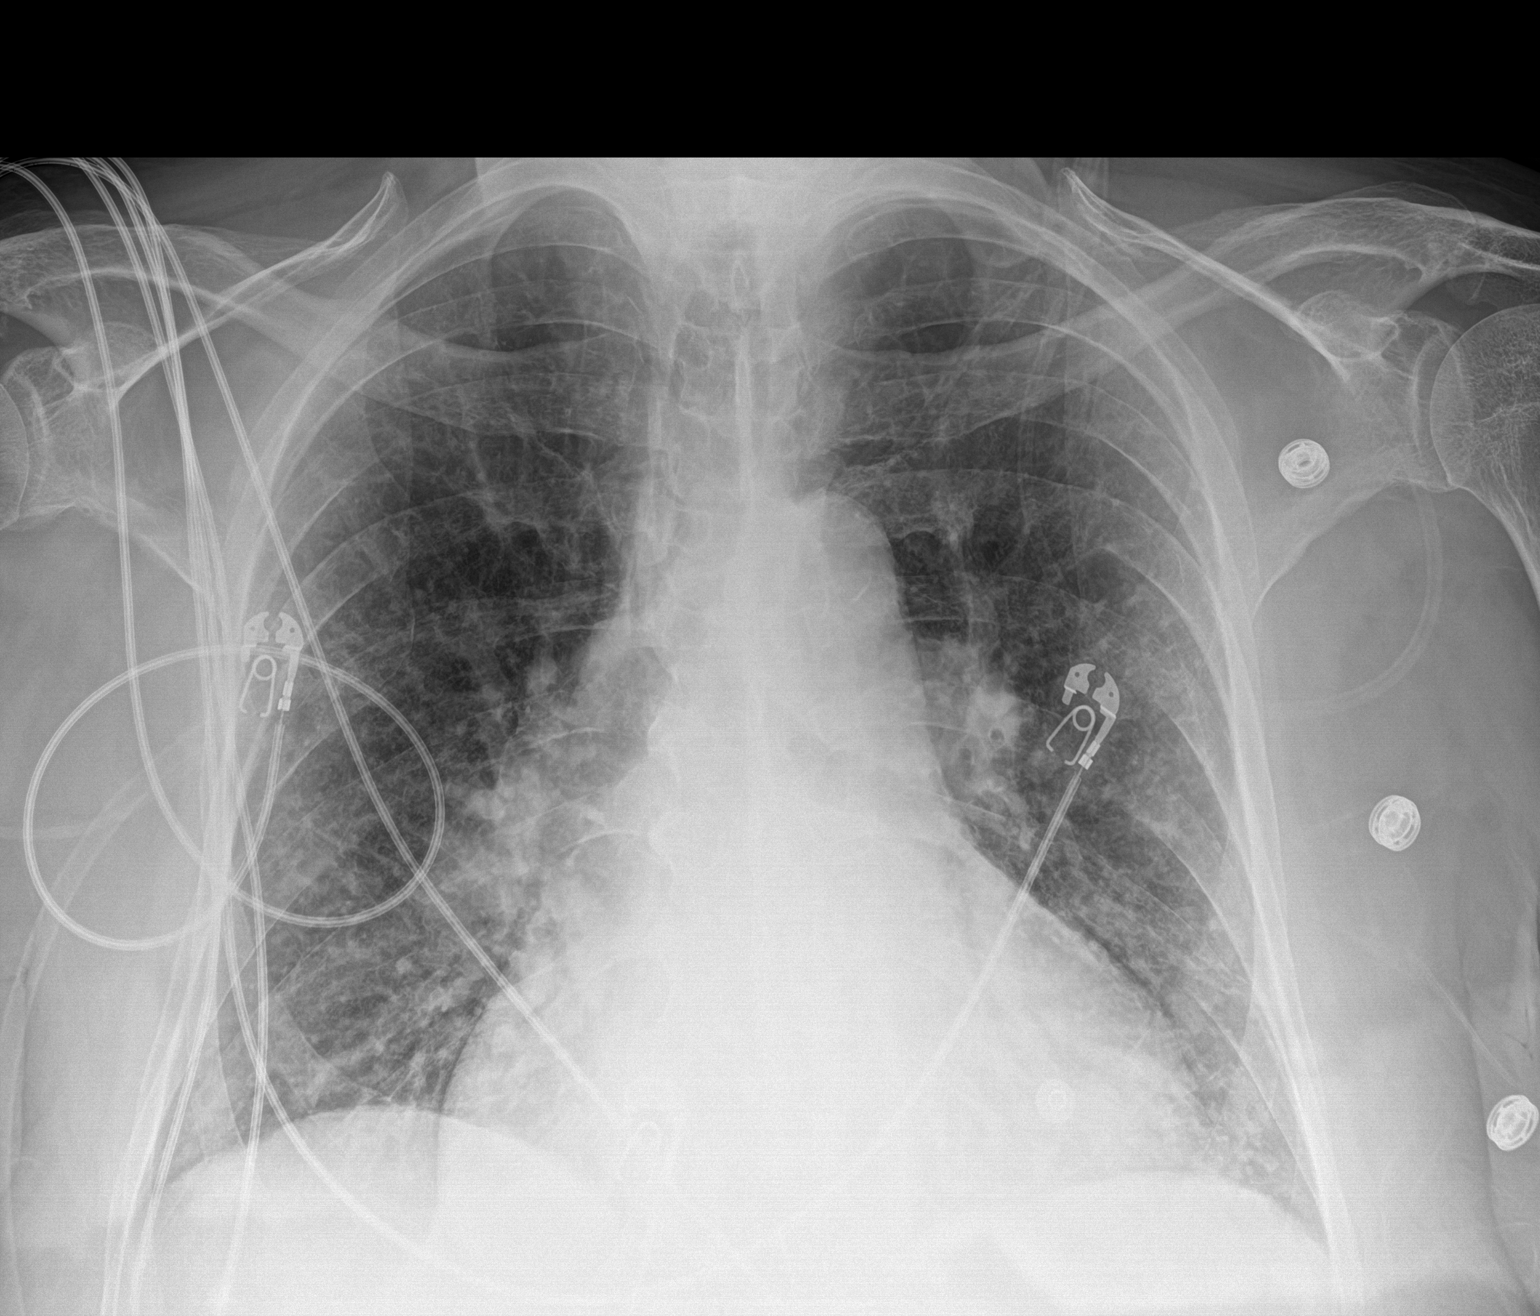

[1 of 1 positions shown; findings below may reference images not displayed]

FINDINGS: New chronic cardiomegaly and vascular pedicle widening. Interstitial
coarsening at the bases which is similar to prior. No focal
infiltrate, effusion, or air leak.
IMPRESSION: 1. No focal opacity to confirm aspiration.
2. Increased interstitial coarsening which could be from portable
technique or mild edema.

## 2022-04-13 IMAGING — US US RENAL
1 series · 14 of 25 positions shown · non-contrast
Comparison: CT 04/19/2021

CLINICAL DATA: Acute kidney injury

EXAM:
RENAL / URINARY TRACT ULTRASOUND COMPLETE

[Series 1: us renal · 14 of 45 slices shown]
[im 1/45]
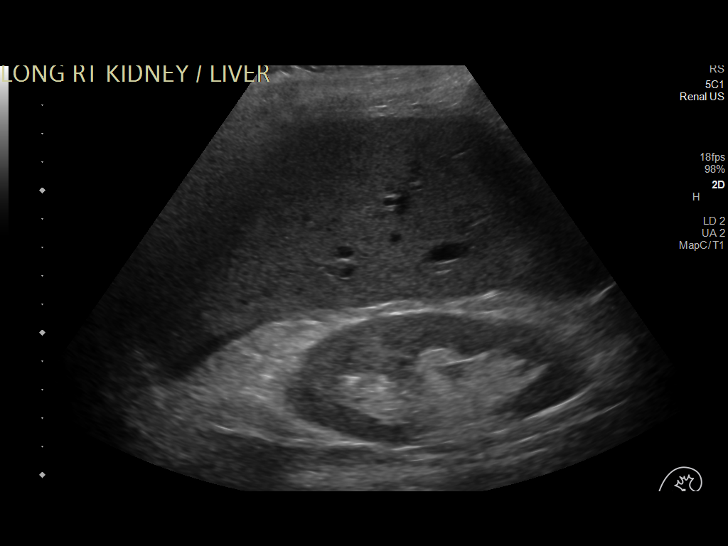
[im 4/45]
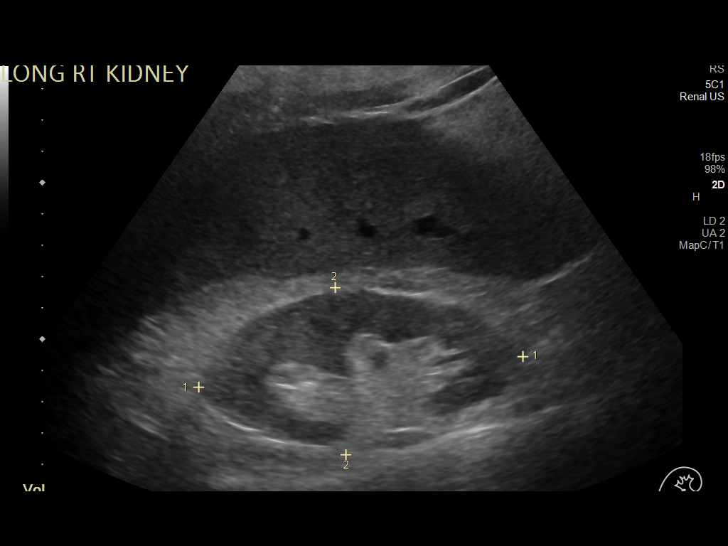
[im 8/45]
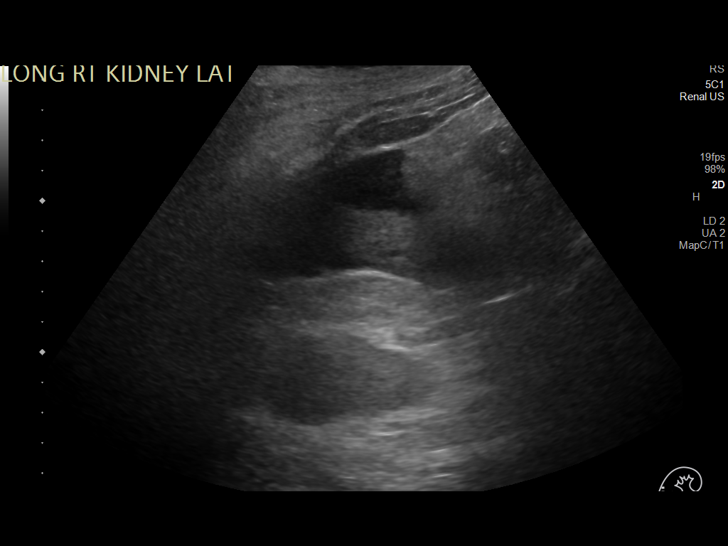
[im 12/45]
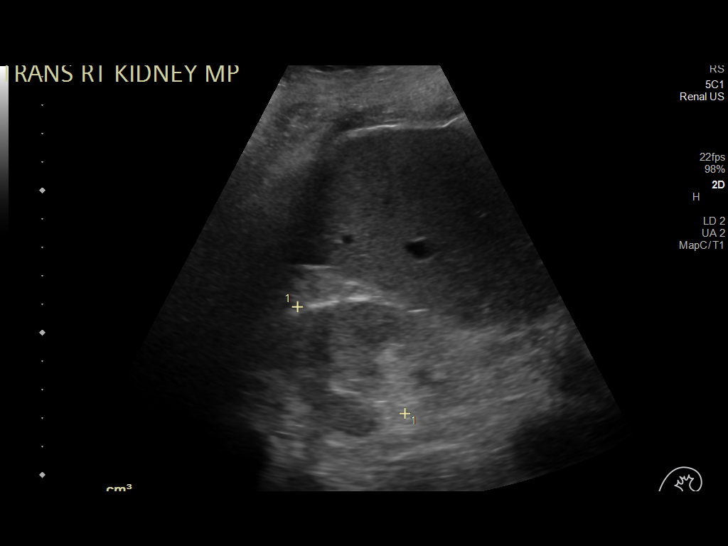
[im 15/45]
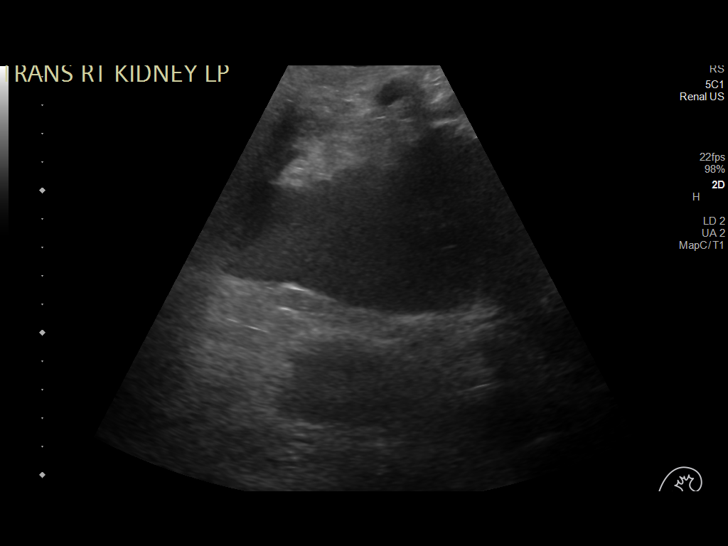
[im 17/45]
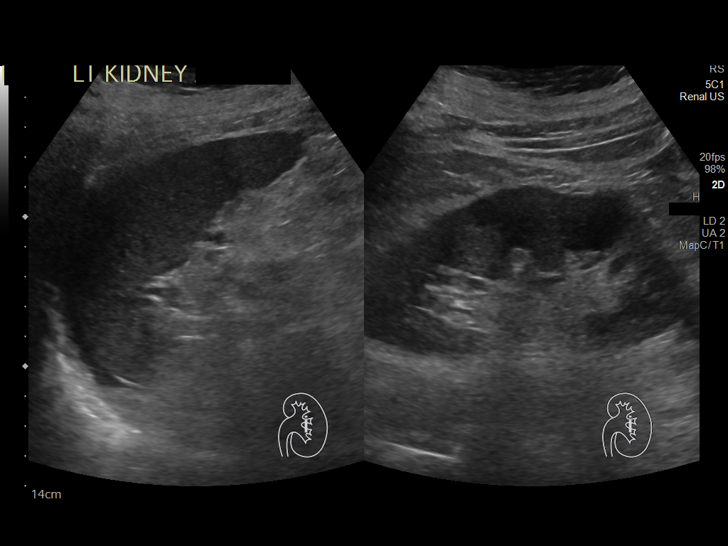
[im 21/45]
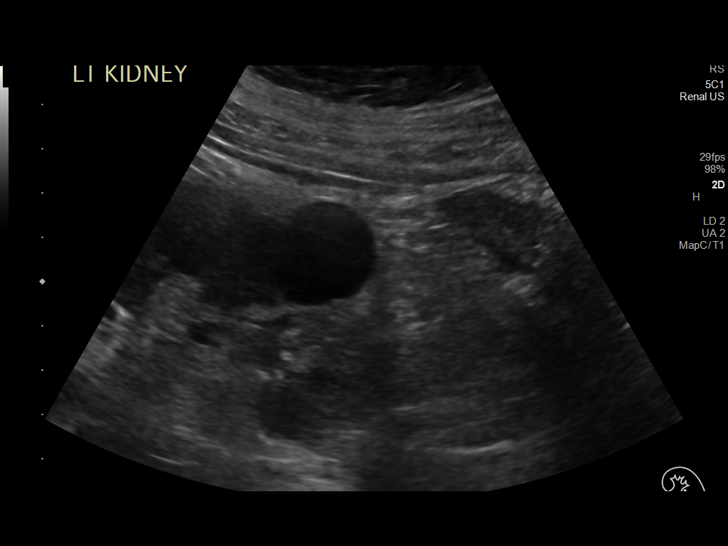
[im 24/45]
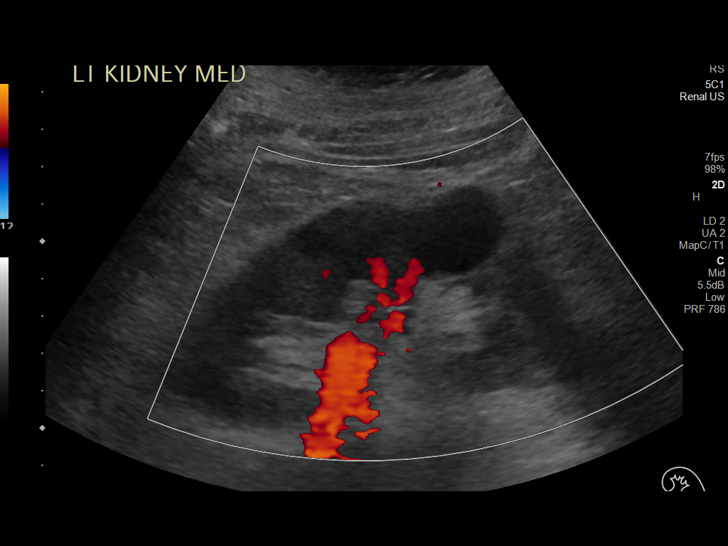
[im 28/45]
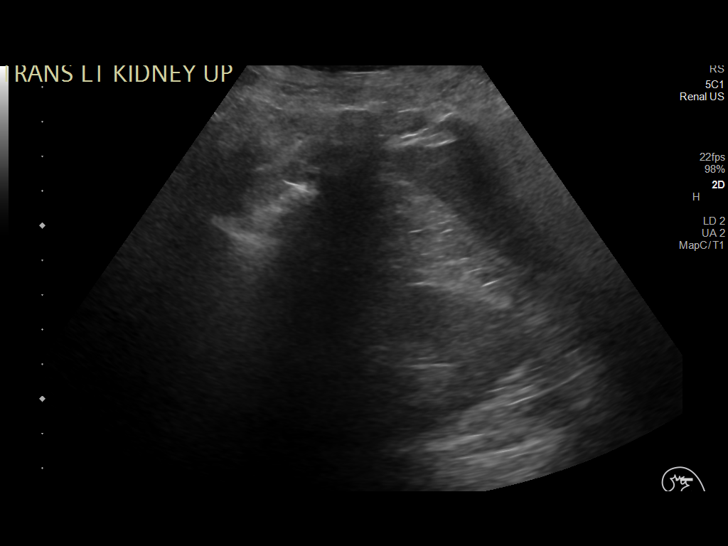
[im 30/45]
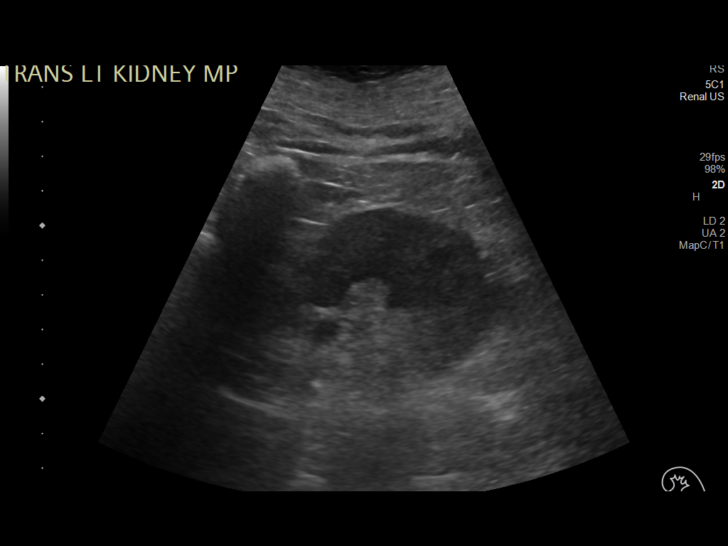
[im 34/45]
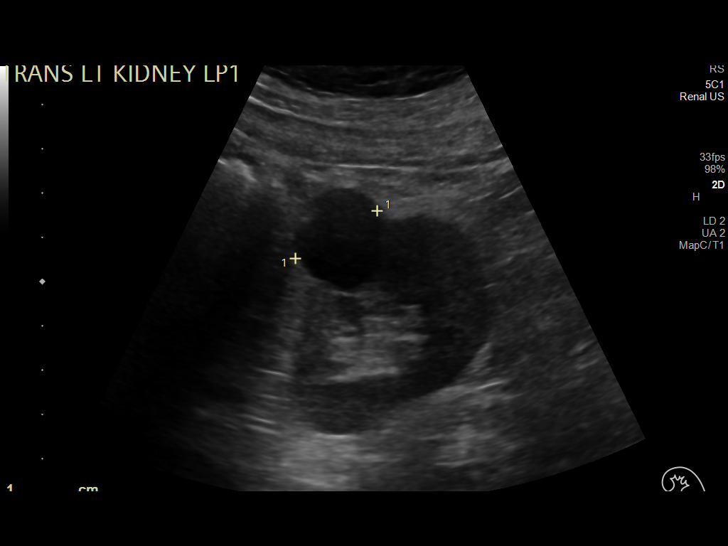
[im 37/45]
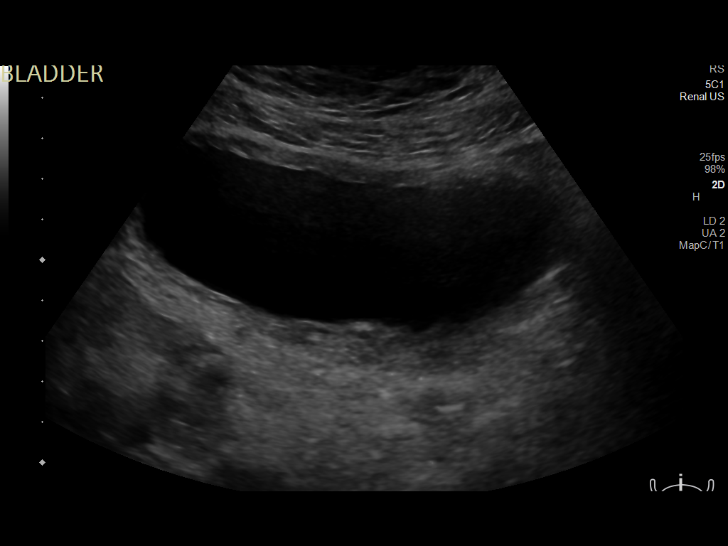
[im 41/45]
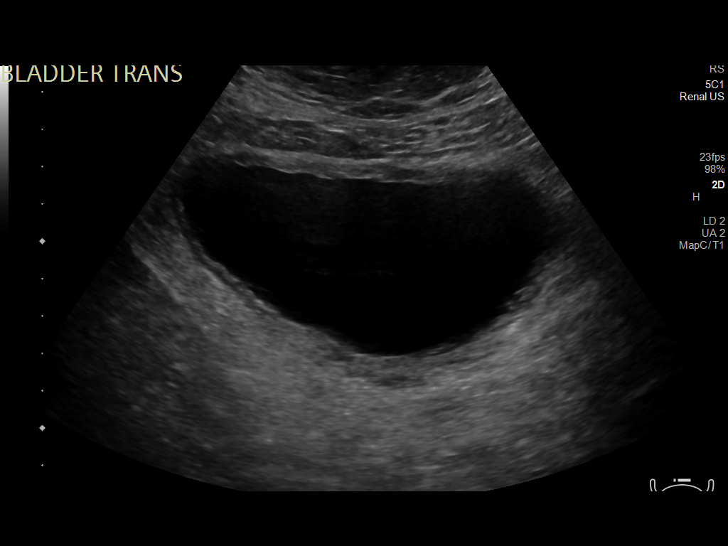
[im 45/45]
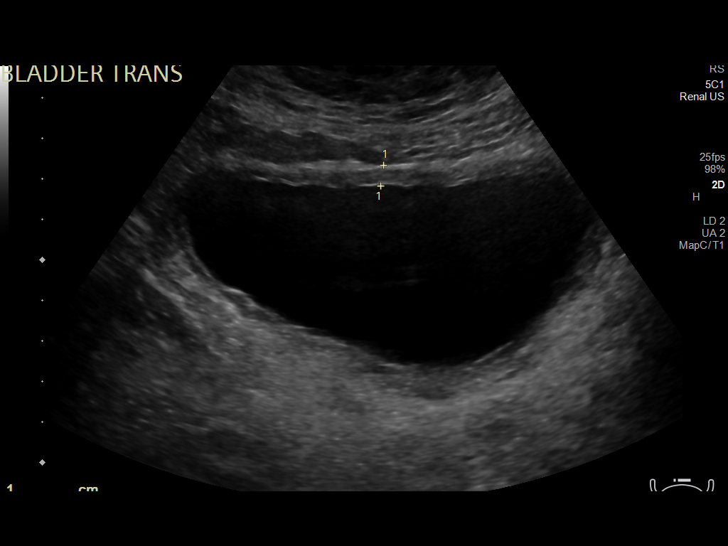

[14 of 25 positions shown; findings below may reference images not displayed]

FINDINGS: Right Kidney:

Renal measurements: 10.4 x 5.4 x 5.3 cm = volume: 155 mL.
Echogenicity within normal limits. No mass, shadowing stone, or
hydronephrosis visualized.

Left Kidney:

Renal measurements: 10.9 x 5.5 x 5.9 cm = volume: 185 mL.
Echogenicity within normal limits. 2.8 cm cyst within the interpolar
region of the left kidney. No shadowing stone or hydronephrosis
visualized.

Bladder:

Diffuse urinary bladder wall thickening.

Other:

Prostatomegaly.
IMPRESSION: 1. No renal stone or hydronephrosis.
2. Prostatomegaly with diffuse urinary bladder wall thickening,
which may be related to chronic outlet obstruction.

## 2022-04-13 IMAGING — US US ABDOMEN LIMITED
1 series · 14 of 25 positions shown · non-contrast
Comparison: CT 04/19/2021

CLINICAL DATA: Transaminitis

EXAM:
ULTRASOUND ABDOMEN LIMITED RIGHT UPPER QUADRANT

[Series 1: us abdomen limited ruq (liver/gb) · 14 of 27 slices shown]
[im 1/27]
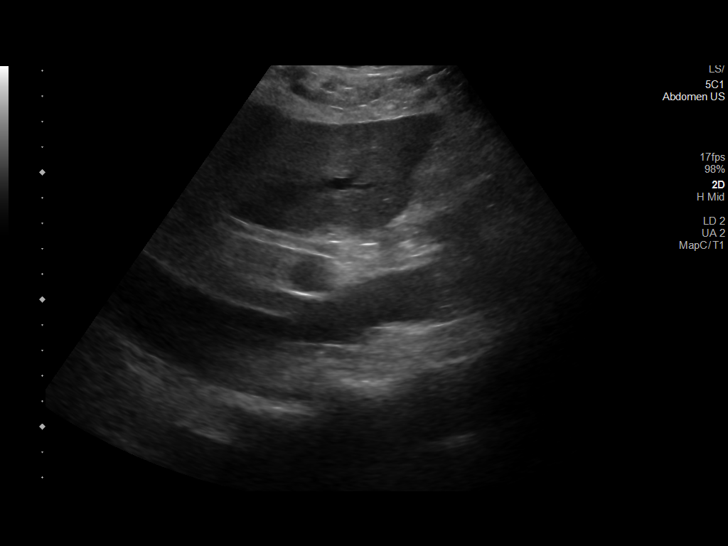
[im 3/27]
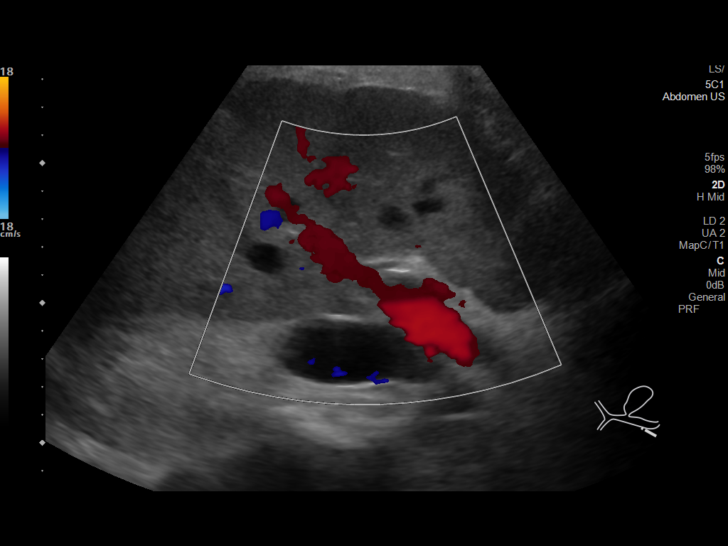
[im 5/27]
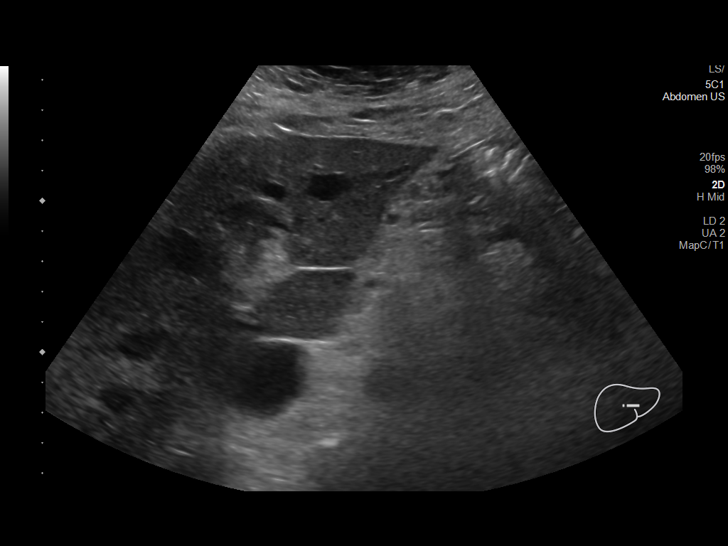
[im 7/27]
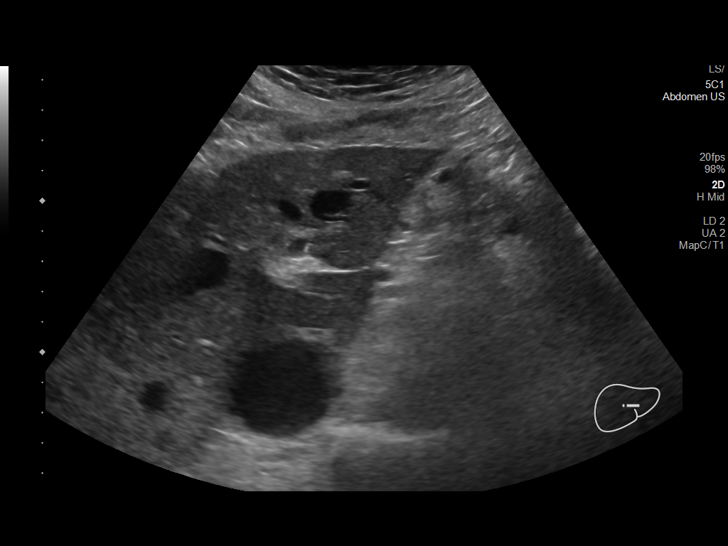
[im 9/27]
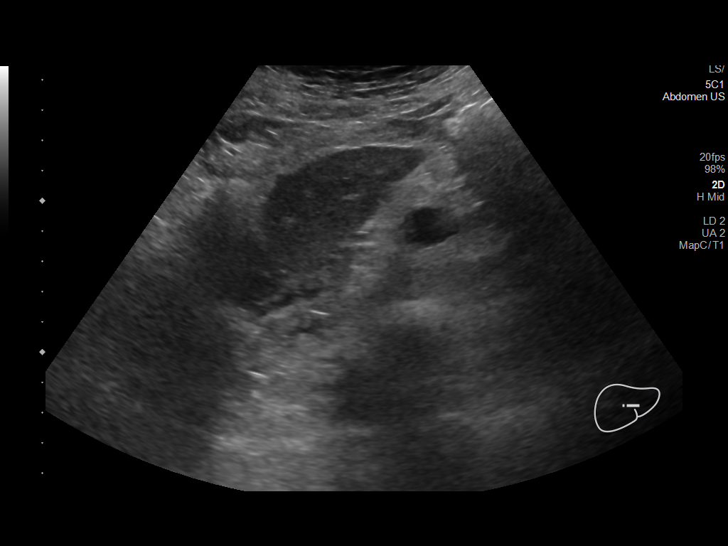
[im 10/27]
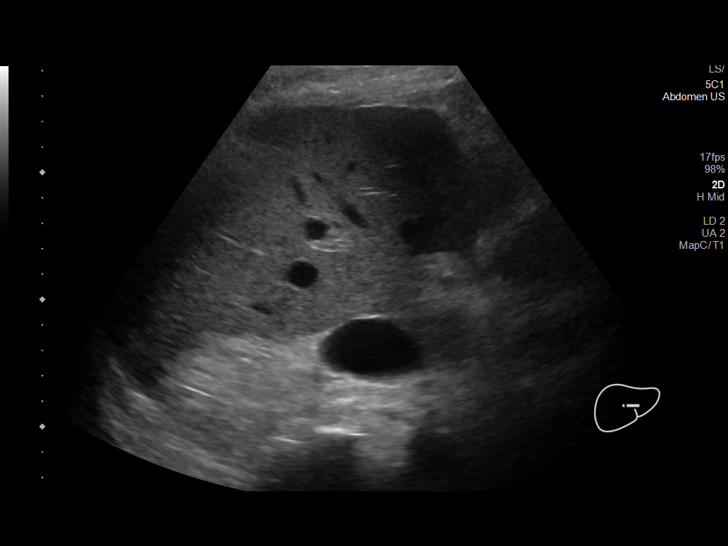
[im 12/27]
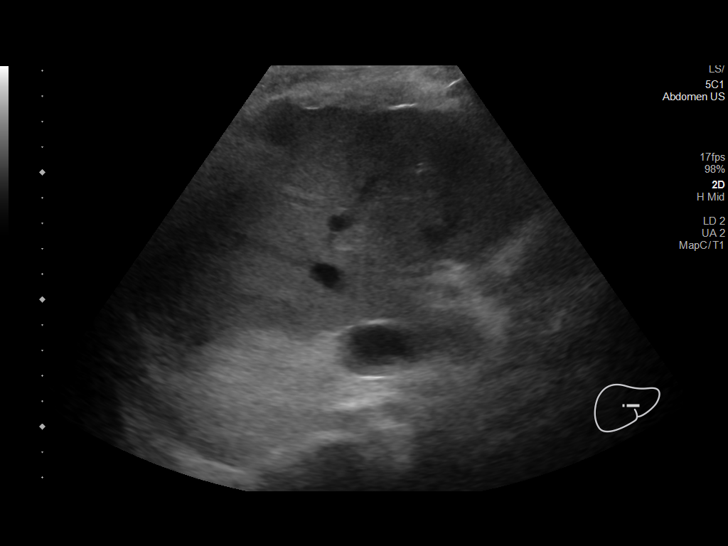
[im 15/27]
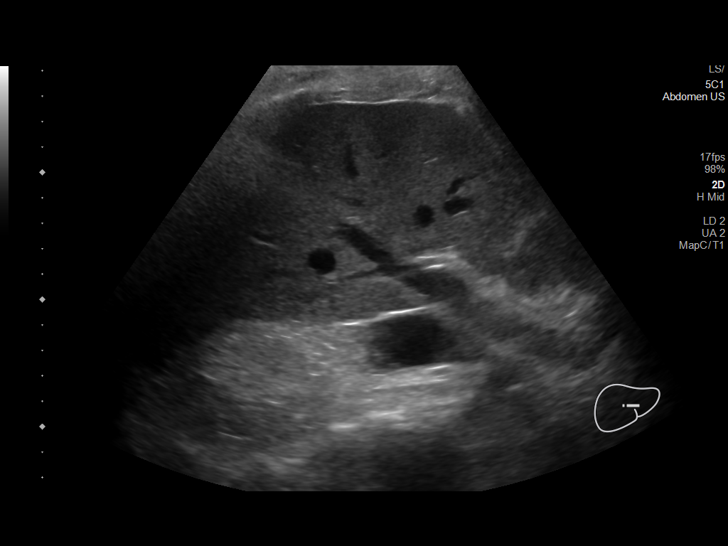
[im 17/27]
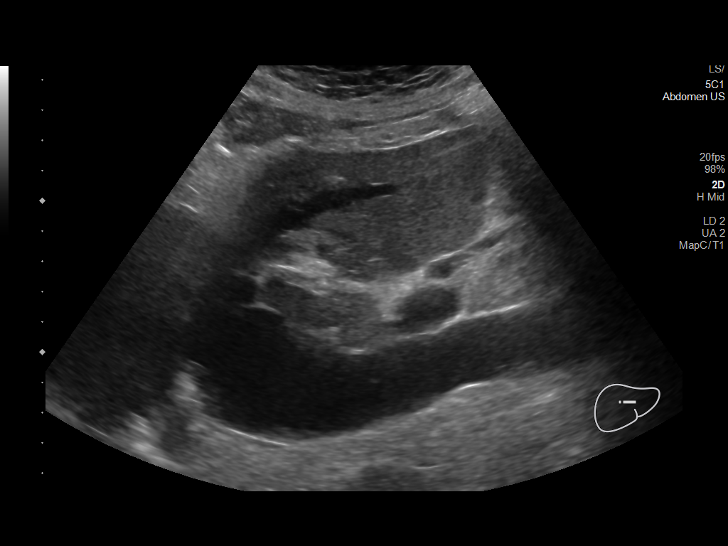
[im 18/27]
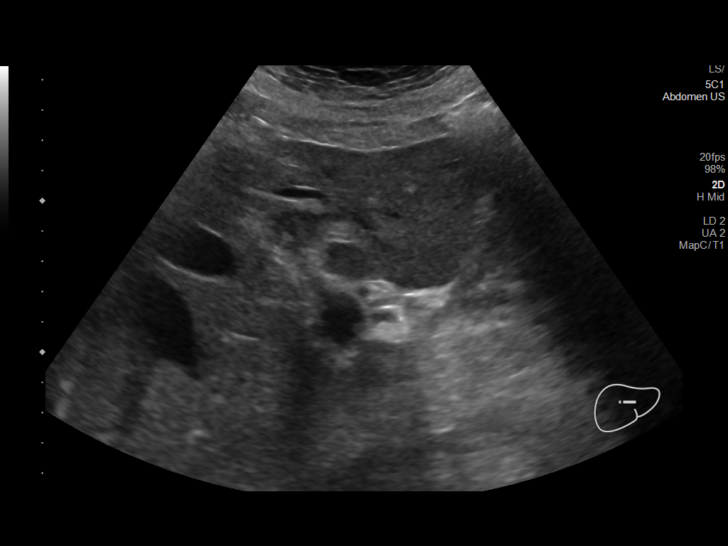
[im 20/27]
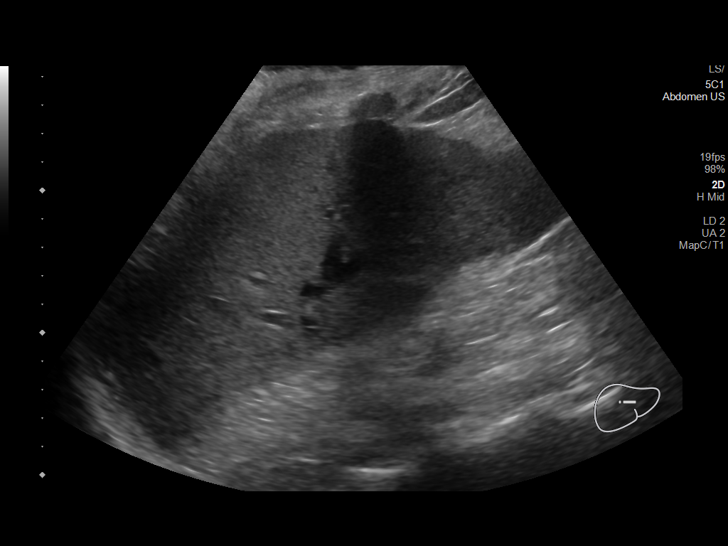
[im 22/27]
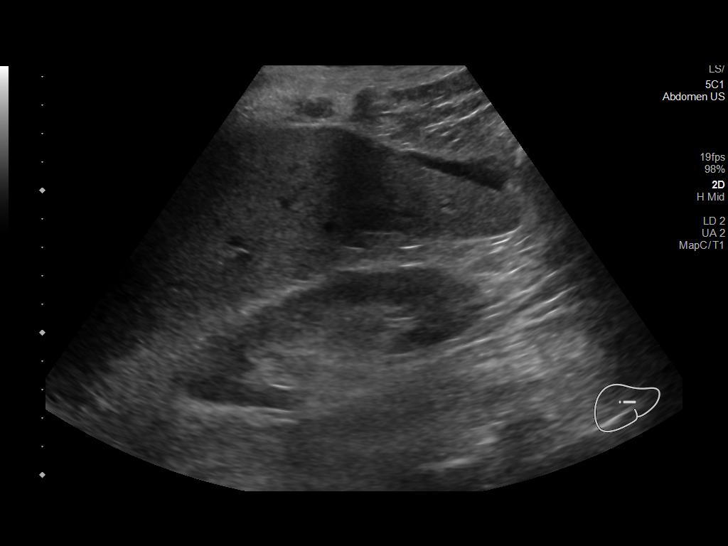
[im 24/27]
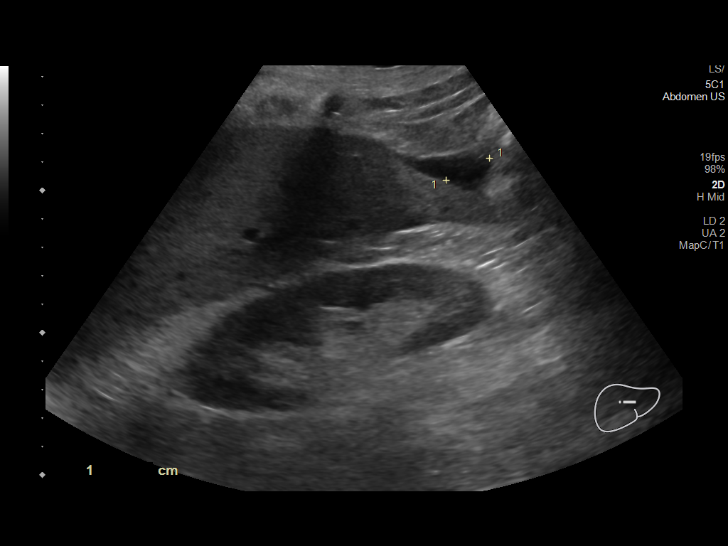
[im 27/27]
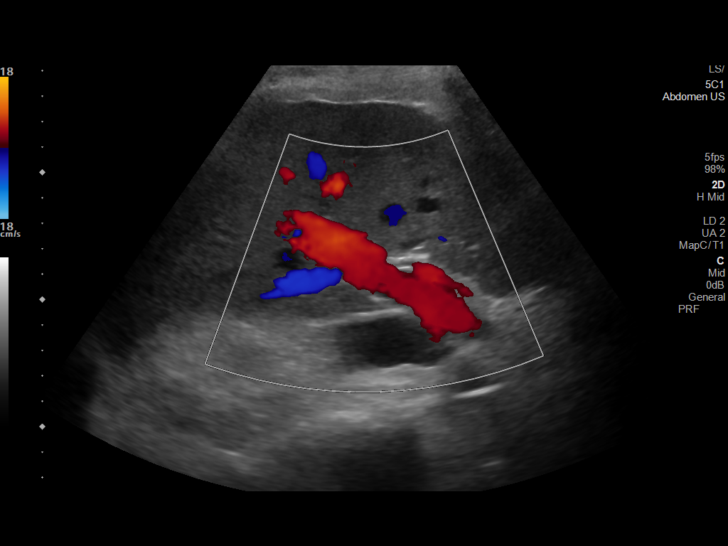

[14 of 25 positions shown; findings below may reference images not displayed]

FINDINGS: Gallbladder:

Surgically absent

Common bile duct:

Diameter: 4.3 mm

Liver:

No focal lesion identified. Within normal limits in parenchymal
echogenicity. Portal vein is patent on color Doppler imaging with
normal direction of blood flow towards the liver.

Other: Trace ascites adjacent to the liver
IMPRESSION: Status post cholecystectomy.  Small amount of perihepatic ascites

## 2022-04-16 ENCOUNTER — Encounter: Payer: Self-pay | Admitting: Podiatry

## 2022-04-16 ENCOUNTER — Encounter (HOSPITAL_COMMUNITY)
Admission: RE | Admit: 2022-04-16 | Discharge: 2022-04-16 | Disposition: A | Discharge status: Home / Self Care | Payer: Medicare Other | Source: Ambulatory Visit | Attending: Pulmonary Disease | Admitting: Pulmonary Disease

## 2022-04-16 ENCOUNTER — Ambulatory Visit (INDEPENDENT_AMBULATORY_CARE_PROVIDER_SITE_OTHER): Payer: Medicare Other | Admitting: Podiatry

## 2022-04-16 DIAGNOSIS — B351 Tinea unguium: Secondary | ICD-10-CM

## 2022-04-16 DIAGNOSIS — M79609 Pain in unspecified limb: Secondary | ICD-10-CM

## 2022-04-16 DIAGNOSIS — D689 Coagulation defect, unspecified: Secondary | ICD-10-CM | POA: Diagnosis not present

## 2022-04-16 DIAGNOSIS — J449 Chronic obstructive pulmonary disease, unspecified: Secondary | ICD-10-CM | POA: Diagnosis not present

## 2022-04-16 NOTE — Progress Notes (Signed)
Home Exercise Prescription I have reviewed a Home Exercise Prescription with Joshua [name].  He is currently exercising on stationary bike for 30 min, 2-3 nonrehab days at home. Encouraged pt to continue.The patient stated that their goals were to keep moving and living. We reviewed exercise guidelines, target heart rate during exercise, RPE Scale, weather conditions, endpoints for exercise, warmup and cool down. The patient is encouraged to come to me with any questions. I will continue to follow up with the patient to assist them with progression and safety.    [name], [place], ACSM-CEP 04/16/2022 12:10 PM

## 2022-04-16 NOTE — Progress Notes (Signed)
  Subjective:  Patient ID: Joshua [name], male    DOB: [birth year],   MRN: [id number]  Chief Complaint  Patient presents with   Nail Problem    Routine foot  care     86 y.o. male presents for elongated thickened and painful toenails. Patient relates he is unable to trim them himself. He  is not diabetic but does have a history of CHF and is on blood thinners. Currently taking eliquis . Denies any other pedal complaints. Denies n/v/f/c.   Past Medical History:  Diagnosis Date   Arthritis    BPH (benign prostatic hyperplasia)    Chronic hiccups    COPD (chronic obstructive pulmonary disease) (HCC)    Folliculitis    posterior scalp per office visit note of Dr Maudie Mercury 07/20/2014     GERD (gastroesophageal reflux disease)    Gout    Hypertension    Hypotension    PE (pulmonary thromboembolism) ([place])    Phlebitis    right arm  at least 20 years ago    Pneumonia    hx of pneumonia as a child    Pulmonary hypertension ([place])    Sleep apnea     Objective:  Physical Exam: Vascular: DP/PT pulses 2/4 bilateral. CFT <3 seconds. Normal hair growth on digits. No edema.  Skin. No lacerations or abrasions bilateral feet. Nails 1-5 are thickened discolored and elongated with subungual debris. Hyperkeratotic cored lesion noted to dorsal fourth digit on right and plantar fifthe metatarsal on right.  Musculoskeletal: MMT 5/5 bilateral lower extremities in DF, PF, Inversion and Eversion. Deceased ROM in DF of ankle joint.  Neurological: Sensation intact to light touch.   Assessment:   1. Onychomycosis   2. Pain due to onychomycosis of nail   3. Coagulation disorder ([place])       Plan:  Patient was evaluated and treated and all questions answered. -Discussed and educated patient on foot care, especially with  regards to the vascular, neurological and musculoskeletal systems.  -Discussed supportive shoes at all times and checking feet regularly.  -Mechanically debrided all nails 1-5 bilateral  using sterile nail nipper and filed with dremel without incident  -Hyperkeratotic corn trimmed without incident.  -Answered all patient questions -Patient to return  in 3 months for at risk foot care -Patient advised to call the office if any problems or questions arise in the meantime.   Lorenda Peck, DPM

## 2022-04-16 NOTE — Progress Notes (Signed)
Daily Session Note  Patient Details  Name: Joshua [name] MRN: [id number] Date of Birth: Feb 12, 1936 Referring Provider:   April Manson [facility] Walk Test from 03/13/2022 in [place]  Referring Provider Elsworth Soho       Encounter Date: 04/16/2022  Check In:  Session Check In - 04/16/22 1119       Check-In   Supervising physician immediately available to respond to emergencies [facility] immediately available    Physician(s) Dr. Florene Glen    Location [facility]    Staff Present Rosebud Poles, RN, BSN;Bodee Lafoe Olen Cordial BS, ACSM-CEP, Exercise Physiologist;Kaylee Rosana Hoes, MS, ACSM-CEP, Exercise Physiologist;Lisa Ysidro Evert, RN    Virtual Visit No    Medication changes reported     No    Fall or balance concerns reported    No    Tobacco Cessation No Change    Warm-up and Cool-down Performed as group-led instruction    Resistance Training Performed Yes    VAD Patient? No    PAD/SET Patient? No      Pain Assessment   Currently in Pain? No/denies    Multiple Pain Sites No             Capillary Blood Glucose: No results found for this or any previous visit (from the past 24 hour(s)).   Exercise Prescription Changes - 04/16/22 1200       Home Exercise Plan   Plans to continue exercise at Home (comment)   stationary bicycle   Frequency Add 2 additional days to program exercise sessions.    Initial Home Exercises Provided 04/16/22             Social History   Tobacco Use  Smoking Status Former   Packs/day: 1.00   Years: 50.00   Total pack years: 50.00   Types: Cigarettes   Start date: 83   Quit date: 08/19/2006   Years since quitting: 15.6  Smokeless Tobacco Never  Tobacco Comments   Former smoke 03/15/22    Goals Met:  Exercise tolerated well No report of concerns or symptoms today Strength training completed today  Goals Unmet:  Not Applicable  Comments: Service time is from 1020 to 1145.    Dr.  Rodman Pickle is Medical Director for Pulmonary Rehab at [facility] ([place]).

## 2022-04-18 ENCOUNTER — Encounter (HOSPITAL_COMMUNITY)
Admission: RE | Admit: 2022-04-18 | Discharge: 2022-04-18 | Disposition: A | Discharge status: Home / Self Care | Payer: Medicare Other | Source: Ambulatory Visit | Attending: Pulmonary Disease | Admitting: Pulmonary Disease

## 2022-04-18 DIAGNOSIS — J449 Chronic obstructive pulmonary disease, unspecified: Secondary | ICD-10-CM

## 2022-04-18 NOTE — Progress Notes (Signed)
Daily Session Note  Patient Details  Name: Joshua [name] MRN: [id number] Date of Birth: March 11, 1936 Referring Provider:   April Manson Pulmonary Rehab Walk Test from 03/13/2022 in [place]  Referring Provider Elsworth Soho       Encounter Date: 04/18/2022  Check In:  Session Check In - 04/18/22 1131       Check-In   Supervising physician immediately available to respond to emergencies [facility] immediately available    Physician(s) Dr. Verlon Au    Location [facility]    Staff Present Rosebud Poles, RN, BSN;Randi Olen Cordial BS, ACSM-CEP, Exercise Physiologist;Kaylee Rosana Hoes, MS, ACSM-CEP, Exercise Physiologist;Lisa Ysidro Evert, RN    Virtual Visit No    Medication changes reported     No    Fall or balance concerns reported    No    Tobacco Cessation No Change    Warm-up and Cool-down Performed as group-led instruction    Resistance Training Performed Yes    VAD Patient? No    PAD/SET Patient? No      Pain Assessment   Currently in Pain? No/denies    Multiple Pain Sites No             Capillary Blood Glucose: No results found for this or any previous visit (from the past 24 hour(s)).    Social History   Tobacco Use  Smoking Status Former   Packs/day: 1.00   Years: 50.00   Total pack years: 50.00   Types: Cigarettes   Start date: 72   Quit date: 08/19/2006   Years since quitting: 15.6  Smokeless Tobacco Never  Tobacco Comments   Former smoke 03/15/22    Goals Met:  Proper associated with RPD/PD & O2 Sat Exercise tolerated well Strength training completed today  Goals Unmet:  Not Applicable  Comments: Contacted Heart Failure for weight gain of 3 kg and significant ankle edema.  They are to contact him at home.Service time is from 1013 to 1142.    Dr. Rodman Pickle is Medical Director for Pulmonary Rehab at [facility].

## 2022-04-19 ENCOUNTER — Telehealth (HOSPITAL_COMMUNITY): Payer: Self-pay | Admitting: *Deleted

## 2022-04-19 MED ORDER — TORSEMIDE 20 MG PO TABS: 40.0000 mg | ORAL_TABLET | Freq: Every day | ORAL | 3 refills | Status: DC

## 2022-04-19 NOTE — Telephone Encounter (Signed)
Pt aware.

## 2022-04-19 NOTE — Telephone Encounter (Signed)
-----  Message from Larey Dresser, MD sent at 04/18/2022  4:18 PM EDT ----- Regarding: RE: Weight gain Can increase torsemide to 40 daily ----- Message ----- From: Harvie Junior, CMA Sent: 04/18/2022   2:56 PM EDT To: Larey Dresser, MD Subject: Melton Alar: Weight gain                                 ----- Message ----- From: Lance Morin, RN Sent: 04/18/2022  11:17 AM EDT To: Scarlette Calico, RN; Harvie Junior, CMA Subject: Weight gain                                    Hello all, Joshua [name] has been attending pulmonary rehab for 4 weeks.  His weight started @ 78.7 kg on 03/26/2022.  He is underweight and our dietician has been working with him to gain weight by adding Ensure between meals and initially we were thinking his weight gain was from this.    His weight increased to 81.4 kg 8/22, 81.7 kg 8/24, and this week has significantly increased to 83.2 kg 8/29 and 84.1 kg 8/31 with significant ankle edema, no SOB, sats 93-95 on 2L o2.  He reports not knowing what to do except report it to HF clinic and did not feel comfortable calling you.  Could you contact him for further instructions.  Thanks, Joshua [name] [facility]monary Rehab

## 2022-04-23 ENCOUNTER — Encounter (HOSPITAL_COMMUNITY)
Admission: RE | Admit: 2022-04-23 | Discharge: 2022-04-23 | Disposition: A | Discharge status: Home / Self Care | Payer: Medicare Other | Source: Ambulatory Visit | Attending: Pulmonary Disease | Admitting: Pulmonary Disease

## 2022-04-23 VITALS — Wt 175.9 lb

## 2022-04-23 DIAGNOSIS — J449 Chronic obstructive pulmonary disease, unspecified: Secondary | ICD-10-CM | POA: Insufficient documentation

## 2022-04-23 DIAGNOSIS — Z5189 Encounter for other specified aftercare: Secondary | ICD-10-CM | POA: Diagnosis not present

## 2022-04-23 NOTE — Progress Notes (Signed)
Daily Session Note  Patient Details  Name: Joshua [name] MRN: [id number] Date of Birth: 17-Jan-1936 Referring Provider:   April Manson Pulmonary Rehab Walk Test from 03/13/2022 in [place]  Referring Provider Elsworth Soho       Encounter Date: 04/23/2022  Check In:  Session Check In - 04/23/22 1125       Check-In   Supervising physician immediately available to respond to emergencies [facility] immediately available    Physician(s) Dr. Verlon Au    Location [facility]    Staff Present Rosebud Poles, RN, BSN;Randi Olen Cordial BS, ACSM-CEP, Exercise Physiologist;Skylah Delauter Rosana Hoes, MS, ACSM-CEP, Exercise Physiologist;Lisa Ysidro Evert, RN    Virtual Visit No    Medication changes reported     No    Fall or balance concerns reported    No    Tobacco Cessation No Change    Warm-up and Cool-down Performed as group-led instruction    Resistance Training Performed Yes    VAD Patient? No    PAD/SET Patient? No      Pain Assessment   Currently in Pain? No/denies    Multiple Pain Sites No             Capillary Blood Glucose: No results found for this or any previous visit (from the past 24 hour(s)).   Exercise Prescription Changes - 04/23/22 1200       Response to Exercise   Blood Pressure (Admit) 90/50    Blood Pressure (Exercise) 108/50    Blood Pressure (Exit) 108/50    Heart Rate (Admit) 84 bpm    Heart Rate (Exercise) 94 bpm    Heart Rate (Exit) 76 bpm    Oxygen Saturation (Admit) 99 %    Oxygen Saturation (Exercise) 90 %    Oxygen Saturation (Exit) 96 %    Rating of Perceived Exertion (Exercise) 12    Perceived Dyspnea (Exercise) 1    Duration Continue with 30 min of aerobic exercise without signs/symptoms of physical distress.    Intensity THRR unchanged      Progression   Progression Continue to progress workloads to maintain intensity without signs/symptoms of physical distress.      Resistance Training   Training  Prescription Yes    Weight red bands    Reps 10-15    Time 10 Minutes      Oxygen   Oxygen Continuous    Liters 2      NuStep   Level 2    SPM 80    Minutes 15    METs 2      Track   Laps 11    Minutes 15    METs 2.28      Oxygen   Maintain Oxygen Saturation 88% or higher             Social History   Tobacco Use  Smoking Status Former   Packs/day: 1.00   Years: 50.00   Total pack years: 50.00   Types: Cigarettes   Start date: 37   Quit date: 08/19/2006   Years since quitting: 15.6  Smokeless Tobacco Never  Tobacco Comments   Former smoke 03/15/22    Goals Met:  Proper associated with RPD/PD & O2 Sat Exercise tolerated well No report of concerns or symptoms today Strength training completed today  Goals Unmet:  Not Applicable  Comments: Service time is from 1018 to 1130.    Dr. Rodman Pickle is Medical Director for Pulmonary Rehab at  [facility].

## 2022-04-24 NOTE — Progress Notes (Signed)
Pulmonary Individual Treatment Plan  Patient Details  Name: Joshua [name] MRN: [id number] Date of Birth: [birth year] Referring Provider:   April Manson Pulmonary Rehab Walk Test from 03/13/2022 in [place]  Referring Provider Elsworth Soho       Initial Encounter Date:  Flowsheet Row Pulmonary Rehab Walk Test from 03/13/2022 in [place]  Date 03/13/22       Visit Diagnosis: Stage 2 moderate COPD by GOLD classification ([place])  Patient's Home Medications on Admission:   Current Outpatient Medications:    albuterol (VENTOLIN HFA) 108 (90 Base) MCG/ACT inhaler, Inhale 1 puff into the lungs every 4 (four) hours as needed for shortness of breath., Disp: , Rfl:    allopurinol (ZYLOPRIM) 100 MG tablet, Take 100 mg by mouth every morning., Disp: , Rfl:    atorvastatin (LIPITOR) 40 MG tablet, Take 1 tablet (40 mg total) by mouth daily., Disp: 30 tablet, Rfl: 2   Azelastine HCl 0.15 % SOLN, Place 2 sprays into the nose daily as needed (allergies)., Disp: , Rfl:    Cinnamon 500 MG capsule, Take 500 mg by mouth every morning., Disp: , Rfl:    ELIQUIS 5 MG TABS tablet, TAKE 1 TABLET BY MOUTH TWICE A DAY, Disp: 60 tablet, Rfl: 11   finasteride (PROSCAR) 5 MG tablet, Take 5 mg by mouth at bedtime., Disp: , Rfl:    gabapentin (NEURONTIN) 100 MG capsule, Take 300 mg by mouth 3 (three) times daily., Disp: , Rfl:    magnesium oxide (MAG-OX) 400 MG tablet, Take 1 tablet (400 mg total) by mouth daily., Disp: 30 tablet, Rfl: 3   metoCLOPramide (REGLAN) 5 MG tablet, Take 5 mg by mouth 3 (three) times daily before meals., Disp: , Rfl:    midodrine (PROAMATINE) 10 MG tablet, Take 1.5 tablets (15 mg total) by mouth 3 (three) times daily with meals., Disp: 90 tablet, Rfl: 3   Multiple Vitamin (MULTIVITAMIN WITH MINERALS) TABS tablet, Take 1 tablet by mouth daily., Disp: 30 tablet, Rfl: 0   ondansetron (ZOFRAN) 4 MG tablet, Take 1 tablet (4 mg total) by  mouth every 6 (six) hours as needed for nausea., Disp: 20 tablet, Rfl: 0   OXYGEN, Inhale 2 L into the lungs continuous., Disp: , Rfl:    pantoprazole (PROTONIX) 40 MG tablet, Take 40 mg by mouth every morning., Disp: , Rfl:    Riociguat 2 MG TABS, Take 2 mg by mouth in the morning, at noon, and at bedtime., Disp: 90 tablet, Rfl: 11   STIOLTO RESPIMAT 2.5-2.5 MCG/ACT AERS, INHALE 2 PUFFS BY MOUTH INTO THE LUNGS DAILY (Patient taking differently: Inhale 2 each into the lungs daily.), Disp: 4 g, Rfl: 5   torsemide (DEMADEX) 20 MG tablet, Take 2 tablets (40 mg total) by mouth daily., Disp: 180 tablet, Rfl: 3  Past Medical History: Past Medical History:  Diagnosis Date   Arthritis    BPH (benign prostatic hyperplasia)    Chronic hiccups    COPD (chronic obstructive pulmonary disease) (HCC)    Folliculitis    posterior scalp per office visit note of Dr Maudie Mercury 07/20/2014     GERD (gastroesophageal reflux disease)    Gout    Hypertension    Hypotension    PE (pulmonary thromboembolism) ([place])    Phlebitis    right arm  at least 20 years ago    Pneumonia    hx of pneumonia as a child    Pulmonary hypertension (  [place])    Sleep apnea     Tobacco Use: Social History   Tobacco Use  Smoking Status Former   Packs/day: 1.00   Years: 50.00   Total pack years: 50.00   Types: Cigarettes   Start date: 46   Quit date: 08/19/2006   Years since quitting: 15.6  Smokeless Tobacco Never  Tobacco Comments   Former smoke 03/15/22    Labs: Review Flowsheet  More data exists      Latest Ref Rng & Units 10/04/2021 03/16/2022 03/17/2022 03/20/2022 03/21/2022  Labs for ITP Cardiac and Pulmonary Rehab  Cholestrol 0 - 200 mg/dL - - - 76  -  LDL (calc) 0 - 99 mg/dL - - - 41  -  HDL-C >40 mg/dL - - - 28  -  Trlycerides <150 mg/dL - - - 35  -  Hemoglobin A1c 4.8 - 5.6 % - 5.0  - - -  Bicarbonate 20.0 - 28.0 mmol/L 20.0 - 28.0 mmol/L 33.4  - - - 30.6  31.0   TCO2 22 - 32 mmol/L 22 - 32 mmol/L 34  - - - 32   33   O2 Saturation % % 83  - 60.5  - 63  64     Capillary Blood Glucose: Lab Results  Component Value Date   GLUCAP 87 03/21/2022   GLUCAP 74 03/20/2022   GLUCAP 83 03/19/2022   GLUCAP 97 03/18/2022   GLUCAP 98 03/17/2022     Pulmonary Assessment Scores:  Pulmonary Assessment Scores     Row Name 03/13/22 1200         ADL UCSD   ADL Phase Entry     SOB Score total 48       CAT Score   CAT Score 20       mMRC Score   mMRC Score 1             UCSD: Self-administered rating of dyspnea associated with activities of daily living (ADLs) 6-point scale (0 = "not at all" to 5 = "maximal or unable to do because of breathlessness")  Scoring Scores range from 0 to 120.  Minimally important difference is 5 units  CAT: CAT can identify the health impairment of COPD patients and is better correlated with disease progression.  CAT has a scoring range of zero to 40. The CAT score is classified into four groups of low (less than 10), medium (10 - 20), high (21-30) and very high (31-40) based on the impact level of disease on health status. A CAT score over 10 suggests significant symptoms.  A worsening CAT score could be explained by an exacerbation, poor medication adherence, poor inhaler technique, or progression of COPD or comorbid conditions.  CAT MCID is 2 points  mMRC: mMRC (Modified Medical Research Council) Dyspnea Scale is used to assess the degree of baseline functional disability in patients of respiratory disease due to dyspnea. No minimal important difference is established. A decrease in score of 1 point or greater is considered a positive change.   Pulmonary Function Assessment:   Exercise Target Goals: Exercise Program Goal: Individual exercise prescription set using results from initial 6 min walk test and THRR while considering  patient's activity barriers and safety.   Exercise Prescription Goal: Initial exercise prescription builds to 30-45 minutes a day  of aerobic activity, 2-3 days per week.  Home exercise guidelines will be given to patient during program as part of exercise prescription that the participant will acknowledge.  Activity Barriers & Risk Stratification:  Activity Barriers & Cardiac Risk Stratification - 03/13/22 1115       Activity Barriers & Cardiac Risk Stratification   Activity Barriers Left Knee Replacement   LTKR in 2016 does not bother him at all now   Cardiac Risk Stratification Moderate   Has diastolic heart failure and Pulmonary hypertension            6 Minute Walk:  6 Minute Walk     Row Name 03/13/22 1216         6 Minute Walk   Phase Initial     Distance 1110 feet     Walk Time 6 minutes     # of Rest Breaks 1  4:10-4:40     MPH 2.1     METS 2.3     RPE 12     Perceived Dyspnea  2     VO2 Peak 8.05     Symptoms No     Resting HR 98 bpm     Resting BP 114/70     Resting Oxygen Saturation  95 %     Exercise Oxygen Saturation  during 6 min walk 88 %     Max Ex. HR 112 bpm     Max Ex. BP 116/66     2 Minute Post BP 112/60       Interval HR   1 Minute HR 84     2 Minute HR 94     3 Minute HR 100     4 Minute HR 98     5 Minute HR 112     6 Minute HR 104     2 Minute Post HR 83     Interval Heart Rate? Yes       Interval Oxygen   Interval Oxygen? Yes     Baseline Oxygen Saturation % 95 %  2L     1 Minute Oxygen Saturation % 99 %     1 Minute Liters of Oxygen 2 L     2 Minute Oxygen Saturation % 95 %     2 Minute Liters of Oxygen 2 L     3 Minute Oxygen Saturation % 91 %     3 Minute Liters of Oxygen 2 L     4 Minute Oxygen Saturation % 88 %     4 Minute Liters of Oxygen 2 L     5 Minute Oxygen Saturation % 90 %     5 Minute Liters of Oxygen 2 L     6 Minute Oxygen Saturation % 90 %     6 Minute Liters of Oxygen 2 L     2 Minute Post Oxygen Saturation % 98 %     2 Minute Post Liters of Oxygen 2 L              Oxygen Initial Assessment:  Oxygen Initial Assessment -  03/13/22 1150       Home Oxygen   Home Oxygen Device Home Concentrator;Portable Concentrator    Sleep Oxygen Prescription Continuous    Liters per minute 2    Home Exercise Oxygen Prescription Pulsed    Liters per minute 2    Home Resting Oxygen Prescription None    Compliance with Home Oxygen Use Yes      Initial 6 min Walk   Oxygen Used Continuous    Liters per minute 2      Program Oxygen Prescription  Program Oxygen Prescription Continuous    Liters per minute 2      Intervention   Short Term Goals To learn and exhibit compliance with exercise, home and travel O2 prescription;To learn and understand importance of monitoring SPO2 with pulse oximeter and demonstrate accurate use of the pulse oximeter.;To learn and understand importance of maintaining oxygen saturations>88%;To learn and demonstrate proper pursed lip breathing techniques or other breathing techniques. ;To learn and demonstrate proper use of respiratory medications    Long  Term Goals Exhibits compliance with exercise, home  and travel O2 prescription;Verbalizes importance of monitoring SPO2 with pulse oximeter and return demonstration;Maintenance of O2 saturations>88%;Exhibits proper breathing techniques, such as pursed lip breathing or other method taught during program session;Compliance with respiratory medication;Demonstrates proper use of MDI's             Oxygen Re-Evaluation:  Oxygen Re-Evaluation     Row Name 03/20/22 1155 04/17/22 1100           Program Oxygen Prescription   Program Oxygen Prescription Continuous Continuous      Liters per minute 2 2        Home Oxygen   Home Oxygen Device Home Concentrator;Portable Concentrator Home Concentrator;Portable Concentrator      Sleep Oxygen Prescription Continuous Continuous      Liters per minute 2 2      Home Exercise Oxygen Prescription Pulsed Pulsed      Liters per minute 2 2      Home Resting Oxygen Prescription None None      Compliance  with Home Oxygen Use Yes Yes        Goals/Expected Outcomes   Short Term Goals To learn and exhibit compliance with exercise, home and travel O2 prescription;To learn and understand importance of monitoring SPO2 with pulse oximeter and demonstrate accurate use of the pulse oximeter.;To learn and understand importance of maintaining oxygen saturations>88%;To learn and demonstrate proper pursed lip breathing techniques or other breathing techniques. ;To learn and demonstrate proper use of respiratory medications To learn and exhibit compliance with exercise, home and travel O2 prescription;To learn and understand importance of monitoring SPO2 with pulse oximeter and demonstrate accurate use of the pulse oximeter.;To learn and understand importance of maintaining oxygen saturations>88%;To learn and demonstrate proper pursed lip breathing techniques or other breathing techniques. ;To learn and demonstrate proper use of respiratory medications      Long  Term Goals Exhibits compliance with exercise, home  and travel O2 prescription;Verbalizes importance of monitoring SPO2 with pulse oximeter and return demonstration;Maintenance of O2 saturations>88%;Exhibits proper breathing techniques, such as pursed lip breathing or other method taught during program session;Compliance with respiratory medication;Demonstrates proper use of MDI's Exhibits compliance with exercise, home  and travel O2 prescription;Verbalizes importance of monitoring SPO2 with pulse oximeter and return demonstration;Maintenance of O2 saturations>88%;Exhibits proper breathing techniques, such as pursed lip breathing or other method taught during program session;Compliance with respiratory medication;Demonstrates proper use of MDI's      Goals/Expected Outcomes Compliance and understanding of oxygen saturation monitoring and breathing techniques to decrease shortness of breath. Compliance and understanding of oxygen saturation monitoring and breathing  techniques to decrease shortness of breath.               Oxygen Discharge (Final Oxygen Re-Evaluation):  Oxygen Re-Evaluation - 04/17/22 1100       Program Oxygen Prescription   Program Oxygen Prescription Continuous    Liters per minute 2      Home Oxygen  Home Oxygen Device Home Concentrator;Portable Concentrator    Sleep Oxygen Prescription Continuous    Liters per minute 2    Home Exercise Oxygen Prescription Pulsed    Liters per minute 2    Home Resting Oxygen Prescription None    Compliance with Home Oxygen Use Yes      Goals/Expected Outcomes   Short Term Goals To learn and exhibit compliance with exercise, home and travel O2 prescription;To learn and understand importance of monitoring SPO2 with pulse oximeter and demonstrate accurate use of the pulse oximeter.;To learn and understand importance of maintaining oxygen saturations>88%;To learn and demonstrate proper pursed lip breathing techniques or other breathing techniques. ;To learn and demonstrate proper use of respiratory medications    Long  Term Goals Exhibits compliance with exercise, home  and travel O2 prescription;Verbalizes importance of monitoring SPO2 with pulse oximeter and return demonstration;Maintenance of O2 saturations>88%;Exhibits proper breathing techniques, such as pursed lip breathing or other method taught during program session;Compliance with respiratory medication;Demonstrates proper use of MDI's    Goals/Expected Outcomes Compliance and understanding of oxygen saturation monitoring and breathing techniques to decrease shortness of breath.             Initial Exercise Prescription:  Initial Exercise Prescription - 03/13/22 1200       Date of Initial Exercise RX and Referring Provider   Date 03/13/22    Referring Provider Elsworth Soho    Expected Discharge Date 05/16/22      Oxygen   Oxygen Continuous    Liters 2    Maintain Oxygen Saturation 88% or higher      NuStep   Level 2    SPM  80    Minutes 15      Track   Minutes 15    METs 2.3      Prescription Details   Frequency (times per week) 2    Duration Progress to 30 minutes of continuous aerobic without signs/symptoms of physical distress      Intensity   THRR 40-80% of Max Heartrate 54-109    Ratings of Perceived Exertion 11-13    Perceived Dyspnea 0-4      Progression   Progression Continue progressive overload as per policy without signs/symptoms or physical distress.      Resistance Training   Training Prescription Yes    Weight blue bands    Reps 10-15             Perform Capillary Blood Glucose checks as needed.  Exercise Prescription Changes:   Exercise Prescription Changes     Row Name 03/26/22 1500 03/28/22 1100 04/09/22 1200 04/16/22 1200 04/23/22 1200     Response to Exercise   Blood Pressure (Admit) 90/50 -- 96/68 -- 90/50   Blood Pressure (Exercise) 102/50 -- 126/58 -- 108/50   Blood Pressure (Exit) 100/56 -- 120/70 -- 108/50   Heart Rate (Admit) 80 bpm -- 81 bpm -- 84 bpm   Heart Rate (Exercise) 107 bpm -- 93 bpm -- 94 bpm   Heart Rate (Exit) 75 bpm -- 97 bpm -- 76 bpm   Oxygen Saturation (Admit) 98 % -- 99 % -- 99 %   Oxygen Saturation (Exercise) 94 % -- 91 % -- 90 %   Oxygen Saturation (Exit) 98 % -- 97 % -- 96 %   Rating of Perceived Exertion (Exercise) 12 -- 12 -- 12   Perceived Dyspnea (Exercise) 1 -- 1 -- 1   Duration Continue with 30 min of aerobic exercise  without signs/symptoms of physical distress. -- Continue with 30 min of aerobic exercise without signs/symptoms of physical distress. -- Continue with 30 min of aerobic exercise without signs/symptoms of physical distress.   Intensity THRR unchanged -- THRR unchanged -- THRR unchanged     Progression   Progression Continue to progress workloads to maintain intensity without signs/symptoms of physical distress. -- Continue to progress workloads to maintain intensity without signs/symptoms of physical distress. --  Continue to progress workloads to maintain intensity without signs/symptoms of physical distress.     Resistance Training   Training Prescription Yes Yes Yes -- Yes   Weight blue bands change to red bands red bands -- red bands   Reps 10-15 10-15 10-15 -- 10-15   Time 10 Minutes 10 Minutes 10 Minutes -- 10 Minutes     Oxygen   Oxygen Continuous -- Continuous -- Continuous   Liters 2 -- 2 -- 2     NuStep   Level 1 -- 1 -- 2   SPM 80 -- 80 -- 80   Minutes 30 -- 15 -- 15   METs 1.6 -- -- -- 2     Track   Laps -- -- 12 -- 11   Minutes -- -- 15 -- 15   METs -- -- 2.39 -- 2.28     Home Exercise Plan   Plans to continue exercise at -- -- -- Home (comment)  stationary bicycle --   Frequency -- -- -- --  n/a --   Initial Home Exercises Provided -- -- -- 04/16/22 --     Oxygen   Maintain Oxygen Saturation 88% or higher -- -- -- 88% or higher            Exercise Comments:   Exercise Comments     Row Name 03/26/22 1222 04/16/22 1208         Exercise Comments Pt completed first day of exercise. Pt exercised 30 min on the Nustep. He averaged 1.6 METs on level 1. He was encouraged to take deep strides. Tolerated well. Walid performed warmup and cooldown standing with support. He practiced sit to stand option today. Discussed with pt METS with him with fair understanding. Discussed home exercise plan with Juanda Crumble. He is currently exercising on stationary bike for 30 min, 2-3 nonrehab days at home. Encouraged pt to continue.               Exercise Goals and Review:   Exercise Goals     Row Name 03/13/22 1212 03/20/22 1152 04/17/22 1048         Exercise Goals   Increase Physical Activity Yes Yes Yes     Intervention Provide advice, education, support and counseling about physical activity/exercise needs.;Develop an individualized exercise prescription for aerobic and resistive training based on initial evaluation findings, risk stratification, comorbidities and  participant's personal goals. Provide advice, education, support and counseling about physical activity/exercise needs.;Develop an individualized exercise prescription for aerobic and resistive training based on initial evaluation findings, risk stratification, comorbidities and participant's personal goals. Provide advice, education, support and counseling about physical activity/exercise needs.;Develop an individualized exercise prescription for aerobic and resistive training based on initial evaluation findings, risk stratification, comorbidities and participant's personal goals.     Expected Outcomes Short Term: Attend rehab on a regular basis to increase amount of physical activity.;Long Term: Add in home exercise to make exercise part of routine and to increase amount of physical activity.;Long Term: Exercising regularly at least 3-5 days a  week. Short Term: Attend rehab on a regular basis to increase amount of physical activity.;Long Term: Add in home exercise to make exercise part of routine and to increase amount of physical activity.;Long Term: Exercising regularly at least 3-5 days a week. Short Term: Attend rehab on a regular basis to increase amount of physical activity.;Long Term: Add in home exercise to make exercise part of routine and to increase amount of physical activity.;Long Term: Exercising regularly at least 3-5 days a week.     Increase Strength and Stamina Yes Yes Yes     Intervention Develop an individualized exercise prescription for aerobic and resistive training based on initial evaluation findings, risk stratification, comorbidities and participant's personal goals.;Provide advice, education, support and counseling about physical activity/exercise needs. Develop an individualized exercise prescription for aerobic and resistive training based on initial evaluation findings, risk stratification, comorbidities and participant's personal goals.;Provide advice, education, support and  counseling about physical activity/exercise needs. Develop an individualized exercise prescription for aerobic and resistive training based on initial evaluation findings, risk stratification, comorbidities and participant's personal goals.;Provide advice, education, support and counseling about physical activity/exercise needs.     Expected Outcomes Short Term: Increase workloads from initial exercise prescription for resistance, speed, and METs.;Short Term: Perform resistance training exercises routinely during rehab and add in resistance training at home;Long Term: Improve cardiorespiratory fitness, muscular endurance and strength as measured by increased METs and functional capacity (6MWT) Short Term: Increase workloads from initial exercise prescription for resistance, speed, and METs.;Short Term: Perform resistance training exercises routinely during rehab and add in resistance training at home;Long Term: Improve cardiorespiratory fitness, muscular endurance and strength as measured by increased METs and functional capacity (6MWT) Short Term: Increase workloads from initial exercise prescription for resistance, speed, and METs.;Short Term: Perform resistance training exercises routinely during rehab and add in resistance training at home;Long Term: Improve cardiorespiratory fitness, muscular endurance and strength as measured by increased METs and functional capacity (6MWT)     Able to understand and use rate of perceived exertion (RPE) scale Yes Yes Yes     Intervention Provide education and explanation on how to use RPE scale Provide education and explanation on how to use RPE scale Provide education and explanation on how to use RPE scale     Expected Outcomes Short Term: Able to use RPE daily in rehab to express subjective intensity level;Long Term:  Able to use RPE to guide intensity level when exercising independently Short Term: Able to use RPE daily in rehab to express subjective intensity  level;Long Term:  Able to use RPE to guide intensity level when exercising independently Short Term: Able to use RPE daily in rehab to express subjective intensity level;Long Term:  Able to use RPE to guide intensity level when exercising independently     Able to understand and use Dyspnea scale Yes Yes Yes     Intervention Provide education and explanation on how to use Dyspnea scale Provide education and explanation on how to use Dyspnea scale Provide education and explanation on how to use Dyspnea scale     Expected Outcomes Short Term: Able to use Dyspnea scale daily in rehab to express subjective sense of shortness of breath during exertion;Long Term: Able to use Dyspnea scale to guide intensity level when exercising independently Short Term: Able to use Dyspnea scale daily in rehab to express subjective sense of shortness of breath during exertion;Long Term: Able to use Dyspnea scale to guide intensity level when exercising independently Short Term: Able to  use Dyspnea scale daily in rehab to express subjective sense of shortness of breath during exertion;Long Term: Able to use Dyspnea scale to guide intensity level when exercising independently     Knowledge and understanding of Target Heart Rate Range (THRR) Yes Yes Yes     Intervention Provide education and explanation of THRR including how the numbers were predicted and where they are located for reference Provide education and explanation of THRR including how the numbers were predicted and where they are located for reference Provide education and explanation of THRR including how the numbers were predicted and where they are located for reference     Expected Outcomes Short Term: Able to state/look up THRR;Long Term: Able to use THRR to govern intensity when exercising independently;Short Term: Able to use daily as guideline for intensity in rehab Short Term: Able to state/look up THRR;Long Term: Able to use THRR to govern intensity when  exercising independently;Short Term: Able to use daily as guideline for intensity in rehab Short Term: Able to state/look up THRR;Long Term: Able to use THRR to govern intensity when exercising independently;Short Term: Able to use daily as guideline for intensity in rehab     Understanding of Exercise Prescription Yes Yes Yes     Intervention Provide education, explanation, and written materials on patient's individual exercise prescription Provide education, explanation, and written materials on patient's individual exercise prescription Provide education, explanation, and written materials on patient's individual exercise prescription     Expected Outcomes Short Term: Able to explain program exercise prescription;Long Term: Able to explain home exercise prescription to exercise independently Short Term: Able to explain program exercise prescription;Long Term: Able to explain home exercise prescription to exercise independently Short Term: Able to explain program exercise prescription;Long Term: Able to explain home exercise prescription to exercise independently              Exercise Goals Re-Evaluation :  Exercise Goals Re-Evaluation     [place] Name 03/20/22 1153 04/17/22 1049           Exercise Goal Re-Evaluation   Exercise Goals Review Increase Physical Activity;Increase Strength and Stamina;Able to understand and use rate of perceived exertion (RPE) scale;Able to understand and use Dyspnea scale;Knowledge and understanding of Target Heart Rate Range (THRR);Understanding of Exercise Prescription Increase Physical Activity;Increase Strength and Stamina;Able to understand and use rate of perceived exertion (RPE) scale;Able to understand and use Dyspnea scale;Knowledge and understanding of Target Heart Rate Range (THRR);Understanding of Exercise Prescription      Comments Mylen was scheduled to start exercise this week. Notes show that he has been in the hospital. Will continue to monitor.  Micky has completed 7 exercise sessions. He exercises for 15 min on the track and Nustep. He averages 2.51 METs on the track and 1.8 METs at level 2 on the Nustep. He performs the warmup and cooldown standing without limitations. Balthazar has progressed to walking the track for 15 min. He has tolerated the track very well. He has also increased his workload on the Nustep. Elius seems very motivated to exercise and improve his functional capacity. We recently discussed home exercise as Harlan is riding his bike at home. Wil continue to monitor and progress able.      Expected Outcomes Through exercise at rehab and home, the patient will decrease shortness of breath with daily activities and feel confident in carrying out an exercise regimen at home. Through exercise at rehab and home, the patient will decrease shortness of breath with daily  activities and feel confident in carrying out an exercise regimen at home.               Discharge Exercise Prescription (Final Exercise Prescription Changes):  Exercise Prescription Changes - 04/23/22 1200       Response to Exercise   Blood Pressure (Admit) 90/50    Blood Pressure (Exercise) 108/50    Blood Pressure (Exit) 108/50    Heart Rate (Admit) 84 bpm    Heart Rate (Exercise) 94 bpm    Heart Rate (Exit) 76 bpm    Oxygen Saturation (Admit) 99 %    Oxygen Saturation (Exercise) 90 %    Oxygen Saturation (Exit) 96 %    Rating of Perceived Exertion (Exercise) 12    Perceived Dyspnea (Exercise) 1    Duration Continue with 30 min of aerobic exercise without signs/symptoms of physical distress.    Intensity THRR unchanged      Progression   Progression Continue to progress workloads to maintain intensity without signs/symptoms of physical distress.      Resistance Training   Training Prescription Yes    Weight red bands    Reps 10-15    Time 10 Minutes      Oxygen   Oxygen Continuous    Liters 2      NuStep   Level 2    SPM 80     Minutes 15    METs 2      Track   Laps 11    Minutes 15    METs 2.28      Oxygen   Maintain Oxygen Saturation 88% or higher             Nutrition:  Target Goals: Understanding of nutrition guidelines, daily intake of sodium <1519m, cholesterol <2026m calories 30% from fat and 7% or less from saturated fats, daily to have 5 or more servings of fruits and vegetables.  Biometrics:  Pre Biometrics - 03/13/22 1030       Pre Biometrics   Grip Strength 23 kg              Nutrition Therapy Plan and Nutrition Goals:  Nutrition Therapy & Goals - 04/18/22 1056       Nutrition Therapy   Diet Heart Healthy Diet    Drug/Food Interactions Statins/Certain Fruits    Protein (specify units) 99g-124g/day (1.2-1.5g/kg actual body weight)      Personal Nutrition Goals   Nutrition Goal Patient to consume a daily variety of fruit, vegetables, lean protein/plant protein, whole grains, and dairy as part of balanced diet.    Personal Goal #2 Patient to understand strategies for weight gain of 0.5-2.0# per week of weight gain.    Personal Goal #3 Patient to limit to <150052mf sodium daily.    Comments Goals in progress. Patient continues up to 2 supplements (Premier, Ensure) per day + 3 regular meals to aid with protein calorie malnutrition. Nursing staff reports concerns of ankle edema today. Patient is up 2# since Tuesday 8/28; reviewed daily sodium recommendations and reading food labels for sodium. Nursing staff and patient will discuss weight trends/changes with doctor.      Intervention Plan   Intervention Prescribe, educate and counsel regarding individualized specific dietary modifications aiming towards targeted core components such as weight, hypertension, lipid management, diabetes, heart failure and other comorbidities.;Nutrition handout(s) given to patient.    Expected Outcomes Short Term Goal: Understand basic principles of dietary content, such as calories, fat, sodium,  cholesterol  and nutrients.;Long Term Goal: Adherence to prescribed nutrition plan.             Nutrition Assessments:  Nutrition Assessments - 03/26/22 1621       Rate Your Plate Scores   Pre Score 37            MEDIFICTS Score Key: ?70 Need to make dietary changes  40-70 Heart Healthy Diet ? 40 Therapeutic Level Cholesterol Diet  Flowsheet Row PULMONARY REHAB CHRONIC OBSTRUCTIVE PULMONARY DISEASE from 03/26/2022 in [place]  Picture Your Plate Total Score on Admission 37      Picture Your Plate Scores: <32 Unhealthy dietary pattern with much room for improvement. 41-50 Dietary pattern unlikely to meet recommendations for good health and room for improvement. 51-60 More healthful dietary pattern, with some room for improvement.  >60 Healthy dietary pattern, although there may be some specific behaviors that could be improved.    Nutrition Goals Re-Evaluation:  Nutrition Goals Re-Evaluation     [place] Name 03/26/22 1153 04/16/22 1152 04/18/22 1056         Goals   Current Weight 173 lb 8 oz (78.7 kg) 183 lb 6.8 oz (83.2 kg) 185 lb 6.5 oz (84.1 kg)     Comment -- No new labs at this time. --     Expected Outcome Patient with medical history of protein calorie malnutrition. Patient recently hospitalized with sepsis for acute renal failure. Palliative care and PCP are both monitoring weight loss. Weight on 03/07/2021 was 231#. He is eating three meals per day and using Premier, Boost, or Ensure 1x per day; encouraged patient to increase to two nutrition supplements daily. He lives at home with is his wife Leron Croak does the majority of the grocery shopping and cooking. Goals in progress. Patient continues up to 2 supplements (Boost, Premier or Ensure) per day + 3 regular meals to aid with protein calorie malnutrition. Patient weight is up 9.9# since starting with our program. He does have congestive heart failure and hypertension. Goals in progress.  Patient continues up to 2 supplements (Premier, Ensure) per day + 3 regular meals to aid with protein calorie malnutrition. Nursing staff reports concerns of ankle edema today. Patient is up 2# since Tuesday 8/28; reviewed daily sodium recommendations and reading food labels for sodium. Nursing staff and patient will discuss weight trends/changes with doctor.              Nutrition Goals Discharge (Final Nutrition Goals Re-Evaluation):  Nutrition Goals Re-Evaluation - 04/18/22 1056       Goals   Current Weight 185 lb 6.5 oz (84.1 kg)    Expected Outcome Goals in progress. Patient continues up to 2 supplements (Premier, Ensure) per day + 3 regular meals to aid with protein calorie malnutrition. Nursing staff reports concerns of ankle edema today. Patient is up 2# since Tuesday 8/28; reviewed daily sodium recommendations and reading food labels for sodium. Nursing staff and patient will discuss weight trends/changes with doctor.             Psychosocial: Target Goals: Acknowledge presence or absence of significant depression and/or stress, maximize coping skills, provide positive support system. Participant is able to verbalize types and ability to use techniques and skills needed for reducing stress and depression.  Initial Review & Psychosocial Screening:  Initial Psych Review & Screening - 03/13/22 1237       Initial Review   Current issues with None Identified  Family Dynamics   Good Support System? Yes   Wife, son, daughter, grandchildren annd 1 great grandchild.     Barriers   Psychosocial barriers to participate in program There are no identifiable barriers or psychosocial needs.      Screening Interventions   Interventions Encouraged to exercise    Expected Outcomes Short Term goal: Identification and review with participant of any Quality of Life or Depression concerns found by scoring the questionnaire.;Short Term goal: Utilizing psychosocial counselor, staff and  physician to assist with identification of specific Stressors or current issues interfering with healing process. Setting desired goal for each stressor or current issue identified.             Quality of Life Scores:  Scores of 19 and below usually indicate a poorer quality of life in these areas.  A difference of  2-3 points is a clinically meaningful difference.  A difference of 2-3 points in the total score of the Quality of Life Index has been associated with significant improvement in overall quality of life, self-image, physical symptoms, and general health in studies assessing change in quality of life.  PHQ-9: Review Flowsheet       03/13/2022 05/09/2021 03/03/2018 11/03/2017  Depression screen PHQ 2/9  Decreased Interest 0 0 0 0  Down, Depressed, Hopeless 0 0 0 0  PHQ - 2 Score 0 0 0 0  Altered sleeping 1 - 0 -  Tired, decreased energy 1 - 1 -  Change in appetite 0 - 0 -  Feeling bad or failure about yourself  0 - - -  Trouble concentrating 0 - 0 -  Moving slowly or fidgety/restless 0 - 0 -  Suicidal thoughts 0 - 0 -  PHQ-9 Score 2 - 1 -  Difficult doing work/chores Not difficult at all - Not difficult at all -   Interpretation of Total Score  Total Score Depression Severity:  1-4 = Minimal depression, 5-9 = Mild depression, 10-14 = Moderate depression, 15-19 = Moderately severe depression, 20-27 = Severe depression   Psychosocial Evaluation and Intervention:  Psychosocial Evaluation - 03/13/22 1240       Psychosocial Evaluation & Interventions   Interventions Encouraged to exercise with the program and follow exercise prescription    Expected Outcomes For Thad to attend the PR program without any psychosocial issues or concerns.    Continue Psychosocial Services  Follow up required by staff             Psychosocial Re-Evaluation:  Psychosocial Re-Evaluation     [place] Name 03/20/22 1649 04/16/22 0913           Psychosocial Re-Evaluation   Current  issues with None Identified None Identified      Comments Audie was scheduled to start the PR program 03/19/22. He was admitted to the hospital 03/16/22 and is currently still in the hospial. We will continue to monitor his condition and suitation No concerns identified at this time, Lael is engaged, positive attitude, attire is very clean and coordinated.  His wife is very supportive and drives himself to pulmonary rehab twice a week.  He has a stable home situation and is steadily gaining weight which is wanted, he had lost a significant amount of weight in the last year.  No psychosocial concerns or barriers are present at this time.      Expected Outcomes -- For Ameer to continue to be without signs of depression, barriers or psychosocial concerns while participating in pulmonary rehab.  Interventions Encouraged to attend Pulmonary Rehabilitation for the exercise Encouraged to attend Pulmonary Rehabilitation for the exercise      Continue Psychosocial Services  Follow up required by staff No Follow up required               Psychosocial Discharge (Final Psychosocial Re-Evaluation):  Psychosocial Re-Evaluation - 04/16/22 0913       Psychosocial Re-Evaluation   Current issues with None Identified    Comments No concerns identified at this time, Nesbit is engaged, positive attitude, attire is very clean and coordinated.  His wife is very supportive and drives himself to pulmonary rehab twice a week.  He has a stable home situation and is steadily gaining weight which is wanted, he had lost a significant amount of weight in the last year.  No psychosocial concerns or barriers are present at this time.    Expected Outcomes For Aulden to continue to be without signs of depression, barriers or psychosocial concerns while participating in pulmonary rehab.    Interventions Encouraged to attend Pulmonary Rehabilitation for the exercise    Continue Psychosocial Services  No Follow up required              Education: Education Goals: Education classes will be provided on a weekly basis, covering required topics. Participant will state understanding/return demonstration of topics presented.  Learning Barriers/Preferences:   Education Topics: Introduction to Pulmonary Rehab Group instruction provided by PowerPoint, verbal discussion, and written material to support subject matter. Instructor reviews what Pulmonary Rehab is, the purpose of the program, and how patients are referred.     Know Your Numbers Group instruction that is supported by a PowerPoint presentation. Instructor discusses importance of knowing and understanding resting, exercise, and post-exercise oxygen saturation, heart rate, and blood pressure. Oxygen saturation, heart rate, blood pressure, rating of perceived exertion, and dyspnea are reviewed along with a normal range for these values.    Exercise for the Pulmonary Patient Group instruction that is supported by a PowerPoint presentation. Instructor discusses benefits of exercise, core components of exercise, frequency, duration, and intensity of an exercise routine, importance of utilizing pulse oximetry during exercise, safety while exercising, and options of places to exercise outside of rehab.  Flowsheet Row PULMONARY REHAB OTHER RESPIRATORY from 02/26/2018 in [place]  Date 12/25/17  Educator EP  Instruction Review Code 1- Verbalizes Understanding          MET Level  Group instruction provided by PowerPoint, verbal discussion, and written material to support subject matter. Instructor reviews what METs are and how to increase METs.    Pulmonary Medications Verbally interactive group education provided by instructor with focus on inhaled medications and proper administration. Flowsheet Row PULMONARY REHAB CHRONIC OBSTRUCTIVE PULMONARY DISEASE from 04/18/2022 in [place]  Date  04/11/22  Educator Donnetta Simpers  [Handout]  Instruction Review Code 1- Verbalizes Understanding       Anatomy and Physiology of the Respiratory System Group instruction provided by PowerPoint, verbal discussion, and written material to support subject matter. Instructor reviews respiratory cycle and anatomical components of the respiratory system and their functions. Instructor also reviews differences in obstructive and restrictive respiratory diseases with examples of each.  Flowsheet Row PULMONARY REHAB CHRONIC OBSTRUCTIVE PULMONARY DISEASE from 04/18/2022 in [place]  Date 04/18/22  Educator Donnetta Simpers  Instruction Review Code 2- Demonstrated Understanding       Oxygen Safety Group instruction provided by PowerPoint, verbal  discussion, and written material to support subject matter. There is an overview of "What is Oxygen" and "Why do we need it".  Instructor also reviews how to create a safe environment for oxygen use, the importance of using oxygen as prescribed, and the risks of noncompliance. There is a brief discussion on traveling with oxygen and resources the patient may utilize. Flowsheet Row PULMONARY REHAB OTHER RESPIRATORY from 02/26/2018 in [place]  Date 12/04/17  Educator Lucianne Lei [place]  Instruction Review Code 2- Demonstrated Understanding       Oxygen Use Group instruction provided by PowerPoint, verbal discussion, and written material to discuss how supplemental oxygen is prescribed and different types of oxygen supply systems. Resources for more information are provided.    Breathing Techniques Group instruction that is supported by demonstration and informational handouts. Instructor discusses the benefits of pursed lip and diaphragmatic breathing and detailed demonstration on how to perform both.     Risk Factor Reduction Group instruction that is supported by a PowerPoint presentation.  Instructor discusses the definition of a risk factor, different risk factors for pulmonary disease, and how the heart and lungs work together.   MD Day A group question and answer session with a medical doctor that allows participants to ask questions that relate to their pulmonary disease state. Flowsheet Row PULMONARY REHAB OTHER RESPIRATORY from 02/26/2018 in [place]  Date 01/22/18  Educator Dr. Nelda Marseille  Instruction Review Code 1- Verbalizes Understanding       Nutrition for the Pulmonary Patient Group instruction provided by PowerPoint slides, verbal discussion, and written materials to support subject matter. The instructor gives an explanation and review of healthy diet recommendations, which includes a discussion on weight management, recommendations for fruit and vegetable consumption, as well as protein, fluid, caffeine, fiber, sodium, sugar, and alcohol. Tips for eating when patients are short of breath are discussed. Flowsheet Row PULMONARY REHAB CHRONIC OBSTRUCTIVE PULMONARY DISEASE from 04/18/2022 in [place]  Date 04/04/22  Educator RD  Instruction Review Code 1- Verbalizes Understanding        Other Education Group or individual verbal, written, or video instructions that support the educational goals of the pulmonary rehab program. Flowsheet Row PULMONARY REHAB CHRONIC OBSTRUCTIVE PULMONARY DISEASE from 04/18/2022 in [place]  Date 03/28/22  Educator Donnetta Simpers  Instruction Review Code 1- Verbalizes Understanding        Knowledge Questionnaire Score:  Knowledge Questionnaire Score - 03/13/22 1201       Knowledge Questionnaire Score   Pre Score 18/18             Core Components/Risk Factors/Patient Goals at Admission:  Personal Goals and Risk Factors at Admission - 03/13/22 1244       Core Components/Risk Factors/Patient Goals on Admission    Weight  Management Weight Loss   Since 04/2019 when pt was hospitalized he has lost 62 pounds. He is concerned about this but they have not determined any medical reasons for this.He states the last month he has lost 6 pounds.   Improve shortness of breath with ADL's Yes    Intervention Provide education, individualized exercise plan and daily activity instruction to help decrease symptoms of SOB with activities of daily living.    Expected Outcomes Short Term: Improve cardiorespiratory fitness to achieve a reduction of symptoms when performing ADLs;Long Term: Be able to perform more ADLs without symptoms or delay the onset  of symptoms    Increase knowledge of respiratory medications and ability to use respiratory devices properly  Yes    Intervention Provide education and demonstration as needed of appropriate use of medications, inhalers, and oxygen therapy.    Expected Outcomes Short Term: Achieves understanding of medications use. Understands that oxygen is a medication prescribed by physician. Demonstrates appropriate use of inhaler and oxygen therapy.;Long Term: Maintain appropriate use of medications, inhalers, and oxygen therapy.             Core Components/Risk Factors/Patient Goals Review:   Goals and Risk Factor Review     [place] Name 03/20/22 1718 04/16/22 0918           Core Components/Risk Factors/Patient Goals Review   Personal Goals Review Improve shortness of breath with ADL's;Develop more efficient breathing techniques such as purse lipped breathing and diaphragmatic breathing and practicing self-pacing with activity.;Increase knowledge of respiratory medications and ability to use respiratory devices properly.;Other Weight Management/Obesity;Increase knowledge of respiratory medications and ability to use respiratory devices properly.;Improve shortness of breath with ADL's;Develop more efficient breathing techniques such as purse lipped breathing and diaphragmatic breathing and  practicing self-pacing with activity.      Review Pt has not started the progam yet due to being hosptialized currentlly. Will continue to mointer weight loss. Micajah has gained 3 kg since starting pulmonary rehab, which is needed.  He lost a significant amount of weight in the last year d/t illness.  He has increased his stamina and strength and is now walking the track and exercising on the nustep.  He is enjoying the program and excited to increase his stamina.      Expected Outcomes See admission goals. See admission goals.               Core Components/Risk Factors/Patient Goals at Discharge (Final Review):   Goals and Risk Factor Review - 04/16/22 0918       Core Components/Risk Factors/Patient Goals Review   Personal Goals Review Weight Management/Obesity;Increase knowledge of respiratory medications and ability to use respiratory devices properly.;Improve shortness of breath with ADL's;Develop more efficient breathing techniques such as purse lipped breathing and diaphragmatic breathing and practicing self-pacing with activity.    Review Damontae has gained 3 kg since starting pulmonary rehab, which is needed.  He lost a significant amount of weight in the last year d/t illness.  He has increased his stamina and strength and is now walking the track and exercising on the nustep.  He is enjoying the program and excited to increase his stamina.    Expected Outcomes See admission goals.             ITP Comments: ITP REVIEW Pt is making expected progress toward pulmonary rehab goals after completing 9 sessions. Recommend continued exercise, life style modification, education, and utilization of breathing techniques to increase stamina and strength and decrease shortness of breath with exertion.   Comments: Dr. Rodman Pickle is Medical Director for Pulmonary Rehab at [facility].

## 2022-04-24 NOTE — Progress Notes (Addendum)
PCP: [name], DO Cardiology: Dr. Burt Knack HF Cardiology: Dr. Aundra Dubin  86 y.o. with history of HFPEF, prior PE, and pulmonary hypertension was referred by Dr. Gasper Sells for evaluation of pulmonary hypertension.  Patient had a PE in 2016.  He is on apixaban. He has a long history of diastolic CHF.  Most recent echo showed a significant component of RV failure with EF 55-60%, IV septum flattened, severe RV enlargement, severely decreased RV function, PASP 57 mmHg.  CTA chest in 4/22 showed no acute PE and mild emphysema, but V/Q scan in 5/22 was suggestive of chronic PE in the right middle lobe.  RHC in 4/22 showed normal filling pressures with moderate PAH. He additionally has chronic hiccups followed by Dr. Benson Norway, now on gabapentin. PFTs in 6/22 showed mixed picture with severe obstruction, moderate restriction, and severely decreased DLCO.   Admitted 8/25-8/30/22 with A/C HF exacerbation. He was aggressively diuresed with lasix/metolazone, had unna boots, farxiga added.  Midodrine was increased for hypotension. Hospitalization c/b AKI on CKD III.  Readmitted 9/1-9/12/22 with shock (mixed septic and cardiogenic), likely 2/2 to aspiration pneumonia and A/C CHF.  Suspect possible gut translocation with ileus/partial SBO; CT abdomen with "nutcracker phenomenon".  He was started on NE + antibiotics. General surgery consulted and felt not to be a surgical candidate. Diuretics initially held due to over-diuresis and shock. Eventually able to wean pressors off, restart PO torsemide and midodrine. Hospitalization c/b transaminitis, delirium, and BPH with urinary retention, requiring foley catheter. Palliative care was consulted for [place] and patient DNR/DNI. PT/OT recommended HH. He was discharged home, weight 220 lbs.  EGD 11/22 showed esophagitis, hiatal hernia and yeast.   Acute visit 07/17/21 for weakness/falls. He had a syncopal episode in clinic. Labs showed hypokalemia of 3.3, SCr elevated at 2.13,  ReDs 27%. [place] stopped, torsemide decreased to 40 mg daily and compression hose and abdominal binder ordered. 3-day Zio placed to evaluate for possible arrhythmogenic cause of symptoms.  Zio monitor showed 19 second run of VT.   Follow up 1/23, rare atypical chest pain. NYHA III, weight continued to trend down.  Admitted 2/23 with PNA and a/c CHF. Seen by palliative care and remained full code.  Admitted 8/23 with septic shock due to aspiration PNA & possible colitis. Echo showed EF 60-65%, RV severely reduced, severely enlarged, RVSP 50, LVEF 60-65%, no RWMA, mild TR, trivial MR. AHF consulted for elevated Hs trop. He underwent R/LHC showing nonobstructive mild CAD, normal/low filling pressures and mild PAH. Prop elevation likely due to demain ischemia. Hospitalization c/b AKI on CKD. He was restarted on Farxiga and lower dose torsemide (10 mg daily). He was discharged home, weight 175 lbs.   Today he returns for post hospital HF follow up with his wife and daughter. Overall feeling fine. Continue to wear 2L oxygen at home. He has been working with cardiac rehab twice a week. He is short of breath walking after walking for 15 minutes on flat ground. Denies palpitations, abnormal bleeding, CP, dizziness, edema, or PND/Orthopnea. Appetite fair, drinking ensure. No fever or chills. Weight at home 174 pounds. Taking all medications. Asking if he needs to take his statin. He is not wearing compression hose. Increased torsemide to 40 mg daily for LE swelling recently.   ECG (personally reviewed): NSR 71 bpm, RBBB  Labs (4/22): K 4.8, creatinine 1.67 Labs (6/22): K 3.8, creatinine 1.72, ANA negative, RF 39.6 but CCP negative, SCL-70 negative, BNP 1231, HIV negative Labs (7/22): K 3.8, creatine 1.67 Labs (  8/22): K 3.6, creatinine 1.6, hgb 15.3  Labs (9/22): K 5.0, creatinine 1.2, hgb 14.6 Labs (10/22): K 3.4, creatinine 1.37 Labs (10/22): K 3.5, creatinine 1.34 Labs (12/22): K 4.5, creatinine  1.81 Labs (2/23): K 3.8, creatinine 1.20 Labs (5/23): K 4.3, creatinine 1.5, BNP 84 Labs (8/23): K 4.5, creatinine 1.00  6 minute walk (7/22): 213 m 6 minute walk (10/22): 274 m (oxygen saturation dropped to 70s on 2L [place])  PMH: 1. HFPEF: With prominent RV failure.   - Echo (2/22): EF 55-60%, IV septum flattened, severe RV enlargement, severely decreased RV function, PASP 57 mmHg.  - Echo (8/23): EF 60-65%, RV severely reduced, severely enlarged, RVSP 50, LVEF 60-65%, no RWMA, mild TR, trivial MR.  2. Venous thromboembolic disease: PE in 1601.   - Venous dopplers (4/22): No DVT.  - CTA chest (4/22): No PE.  - V/Q scan 5/22 with perfusion defect in the RML consistent with chronic PE.  3. OSA: Waiting on CPAP.  4. HTN 5. COPD: Prior smoker.   - CTA chest in 4/22 showed no PE, mild emphysema.  - PFTs (6/22) with severe obstruction, moderate restriction, severely decreased DLCO 6. Pulmonary hypertension: RHC (4/22) with mean RA 5, PA 65/19 mean 36, mean PCWP 5, CI 2.19, PVR 6.1 WU, PAPi 9.2.  - RHC (8/23) with mean RA 1, PA 46/10 mean 25, mean PCWP 4, CI 2.98, PVR 3.4 WU/ 7. Chronic hiccups 8. BPH: Has to in and out cath at times. 9. Syncope: Zio 12/22 with 19 second VT run.  10. Chronic hiccups 11. 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 Stopped working in  2022  Tobacco Use   Smoking status: Former    Packs/day: 1.00    Years: 50.00    Total pack years: 50.00    Types: Cigarettes    Start date: 42    Quit date: 08/19/2006    Years since quitting: 15.6   Smokeless tobacco: Never   Tobacco comments:    Former smoke  03/15/22  Vaping Use   Vaping Use: Never used  Substance and Sexual Activity   Alcohol use: Not Currently    Alcohol/week: 6.0 standard drinks of alcohol    Types: 6 Shots of liquor per week   Drug use: No   Sexual activity: Not on file  Other Topics Concern   Not on file  Social History Narrative   Not on file   Social Determinants of Health   Financial Resource Strain: Not on file  Food Insecurity: No Food Insecurity (05/09/2021)   Hunger Vital Sign    Worried About Running Out of Food in the Last Year: Never true    Ran Out of Food in the Last Year: Never true  Transportation Needs: No Transportation Needs (05/09/2021)   PRAPARE - Hydrologist (Medical): No    Lack of Transportation (Non-Medical): No  Physical Activity: Not on file  Stress: No Stress Concern Present (05/09/2021)   [place]    Feeling of Stress : Only a little  Social Connections: Not on  file  Intimate Partner Violence: Not At Risk (05/09/2021)   Humiliation, Afraid, Rape, and Kick questionnaire    Fear of Current or Ex-Partner: No    Emotionally Abused: No    Physically Abused: No    Sexually Abused: No   Family History  Problem Relation Age of Onset   Heart attack Brother 88   ROS: All systems reviewed and negative except as per HPI.   Current Outpatient Medications  Medication Sig Dispense Refill   albuterol (VENTOLIN HFA) 108 (90 Base) MCG/ACT inhaler Inhale 1 puff into the lungs every 4 (four) hours as needed for shortness of breath.     allopurinol (ZYLOPRIM) 100 MG tablet Take 100 mg by mouth every morning.     atorvastatin (LIPITOR) 40 MG tablet Take 1 tablet (40 mg total) by mouth daily. 30 tablet 2   Azelastine HCl 0.15 % SOLN Place 2 sprays into the nose daily as needed (allergies).     Cinnamon 500 MG capsule Take 500 mg by mouth every morning.     ELIQUIS 5 MG TABS tablet TAKE 1 TABLET BY MOUTH  TWICE A DAY 60 tablet 11   finasteride (PROSCAR) 5 MG tablet Take 5 mg by mouth at bedtime.     gabapentin (NEURONTIN) 100 MG capsule Take 300 mg by mouth 3 (three) times daily.     magnesium oxide (MAG-OX) 400 MG tablet Take 1 tablet (400 mg total) by mouth daily. 30 tablet 3   metoCLOPramide (REGLAN) 5 MG tablet Take 5 mg by mouth 3 (three) times daily before meals.     midodrine (PROAMATINE) 10 MG tablet Take 1.5 tablets (15 mg total) by mouth 3 (three) times daily with meals. 90 tablet 3   Multiple Vitamin (MULTIVITAMIN WITH MINERALS) TABS tablet Take 1 tablet by mouth daily. 30 tablet 0   ondansetron (ZOFRAN) 4 MG tablet Take 1 tablet (4 mg total) by mouth every 6 (six) hours as needed for nausea. 20 tablet 0   OXYGEN Inhale 2 L into the lungs continuous.     pantoprazole (PROTONIX) 40 MG tablet Take 40 mg by mouth every morning.     Riociguat 2 MG TABS Take 2 mg by mouth in the morning, at noon, and at bedtime. 90 tablet 11   STIOLTO RESPIMAT 2.5-2.5 MCG/ACT AERS INHALE 2 PUFFS BY MOUTH INTO THE LUNGS DAILY (Patient taking differently: Inhale 2 each into the lungs daily.) 4 g 5   torsemide (DEMADEX) 20 MG tablet Take 2 tablets (40 mg total) by mouth daily. 180 tablet 3   No current facility-administered medications for this encounter.   Wt Readings from Last 3 Encounters:  04/26/22 79.2 kg (174 lb 9.6 oz)  04/23/22 79.8 kg (175 lb 14.8 oz)  04/11/22 81.7 kg (180 lb 1.9 oz)   BP 108/69   Pulse 76   Ht _0  (1.854 m)   Wt 79.2 kg (174 lb 9.6 oz)   SpO2 96% Comment: on 2L  BMI 23.04 kg/m  Physical Exam: General:  NAD. No resp difficulty, walked into clinic on 2L oxygen HEENT: Normal Neck: Supple. No JVD. Carotids 2+ bilat; no bruits. No lymphadenopathy or thryomegaly appreciated. Cor: PMI nondisplaced. Regular rate & rhythm. No rubs, gallops or murmurs. Lungs: Faint rhonchi RLL Abdomen: Soft, nontender, nondistended. No hepatosplenomegaly. No bruits or masses. Good bowel  sounds. Extremities: No cyanosis, clubbing, rash, trace BLE edema Neuro: Alert & oriented x 3, cranial nerves grossly intact. Moves all 4 extremities w/o difficulty. Affect  pleasant.  Assessment/Plan: 1. Chronic HFpEF/RV failure: Echo (2/22) with EF 55-60%, IV septum flattened, severe RV enlargement, severely decreased RV function, PASP 57 mmHg.  He has severe RV failure at baseline.  Echo this admit 8/23 >>RV severely reduced, severely enlarged, RVSP 50, LVEF 60-65%, no RWMA, mild TR, trivial MR.  RHC 8/23 showed normal/low filling pressures and mild PAH.  Chronically NYHA III, seems improved today. He is not volume overloaded today. - Continue midodrine 15 mg tid. Would avoid fludrocortisone. - Continue dapagliflozin 10 mg daily. - Continue torsemide 40 mg daily. BMET and BNP today. 2. Pulmonary HTN: PAH noted on 4/22 RHC with PVR 6.1 WU.  This appears to be multifactorial with OSA, severe emphysema, and a suspected chronic PE involving the right middle lobe (group 3 and group 4 PH). Given the suspected mixed etiology with only 1 area of chronic thromboembolism (right middle lobe) as well as age, do not think that pulmonary thromboendarterectomy would be indicated.  Rheumatologic serologic workup was negative.  PFTs showed severe obstruction and moderate restriction, suggesting significant COPD. [place] 8/23 showed mild pulmonary hypertension with PVR 3.4 WU.  - Continue riociguat.  - Consider addition of Tyvaso next.  3. CKD III: BMET today. 4. CAD: non obstructive on LHC 8/23. - Continue statin. We discussed rationale for this today. Send refill. 5. OSA: Moderate OSA on sleep study. He refused CPAP. 6. Emphysema: Prior smoker.  Emphysema on CT and severe obstruction on PFTs. On 2L home oxygen.  7. Chronic PE: Diagnosed by V/Q scan.  No abnormal bleeding. He has a probable chronic RLE DVT on venous doppler this admission.  - Continue Eliquis. CBC today. 8. Hiatial hernia w/ esophagitis: Has  significant GERD. He follows with Dr. Benson Norway. - Continue PPI.  9. Nutcracker SMA: Compresses duodenum.   Follow up in 2 months with Dr. Aundra Dubin.  Allena Katz, FNP-BC 04/26/22

## 2022-04-25 ENCOUNTER — Encounter (HOSPITAL_COMMUNITY)
Admission: RE | Admit: 2022-04-25 | Discharge: 2022-04-25 | Disposition: A | Discharge status: Home / Self Care | Payer: Medicare Other | Source: Ambulatory Visit | Attending: Pulmonary Disease | Admitting: Pulmonary Disease

## 2022-04-25 DIAGNOSIS — J449 Chronic obstructive pulmonary disease, unspecified: Secondary | ICD-10-CM

## 2022-04-25 DIAGNOSIS — Z5189 Encounter for other specified aftercare: Secondary | ICD-10-CM | POA: Diagnosis not present

## 2022-04-25 NOTE — Progress Notes (Signed)
Daily Session Note  Patient Details  Name: Joshua [name] MRN: [id number] Date of Birth: 10/09/1935 Referring Provider:   April Manson Pulmonary Rehab Walk Test from 03/13/2022 in [place]  Referring Provider Elsworth Soho       Encounter Date: 04/25/2022  Check In:  Session Check In - 04/25/22 1129       Check-In   Supervising physician immediately available to respond to emergencies [facility] immediately available    Physician(s) Dr. Cyndia Skeeters    Location [facility]    Staff Present Rosebud Poles, RN, BSN;Randi Olen Cordial BS, ACSM-CEP, Exercise Physiologist;Arian Murley Rosana Hoes, MS, ACSM-CEP, Exercise Physiologist;Lisa Ysidro Evert, RN    Virtual Visit No    Medication changes reported     No    Fall or balance concerns reported    No    Tobacco Cessation No Change    Warm-up and Cool-down Performed as group-led instruction    Resistance Training Performed Yes    VAD Patient? No    PAD/SET Patient? No      Pain Assessment   Currently in Pain? No/denies    Multiple Pain Sites No             Capillary Blood Glucose: No results found for this or any previous visit (from the past 24 hour(s)).    Social History   Tobacco Use  Smoking Status Former   Packs/day: 1.00   Years: 50.00   Total pack years: 50.00   Types: Cigarettes   Start date: 81   Quit date: 08/19/2006   Years since quitting: 15.6  Smokeless Tobacco Never  Tobacco Comments   Former smoke 03/15/22    Goals Met:  Exercise tolerated well No report of concerns or symptoms today Strength training completed today  Goals Unmet:  Not Applicable  Comments: Service time is from 1018 to 1142.    Dr. Rodman Pickle is Medical Director for Pulmonary Rehab at [facility].

## 2022-04-26 ENCOUNTER — Ambulatory Visit (HOSPITAL_COMMUNITY)
Admission: RE | Admit: 2022-04-26 | Discharge: 2022-04-26 | Disposition: A | Discharge status: Home / Self Care | Payer: Medicare Other | Source: Ambulatory Visit | Attending: Family Medicine | Admitting: Family Medicine

## 2022-04-26 ENCOUNTER — Telehealth (HOSPITAL_COMMUNITY): Payer: Self-pay | Admitting: Surgery

## 2022-04-26 ENCOUNTER — Encounter (HOSPITAL_COMMUNITY): Payer: Self-pay

## 2022-04-26 VITALS — BP 108/69 | HR 76 | Ht 73.0 in | Wt 174.6 lb

## 2022-04-26 DIAGNOSIS — N400 Enlarged prostate without lower urinary tract symptoms: Secondary | ICD-10-CM | POA: Diagnosis not present

## 2022-04-26 DIAGNOSIS — Z86711 Personal history of pulmonary embolism: Secondary | ICD-10-CM | POA: Diagnosis not present

## 2022-04-26 DIAGNOSIS — K2100 Gastro-esophageal reflux disease with esophagitis, without bleeding: Secondary | ICD-10-CM | POA: Diagnosis not present

## 2022-04-26 DIAGNOSIS — I272 Pulmonary hypertension, unspecified: Secondary | ICD-10-CM | POA: Insufficient documentation

## 2022-04-26 DIAGNOSIS — K449 Diaphragmatic hernia without obstruction or gangrene: Secondary | ICD-10-CM

## 2022-04-26 DIAGNOSIS — G4733 Obstructive sleep apnea (adult) (pediatric): Secondary | ICD-10-CM | POA: Diagnosis not present

## 2022-04-26 DIAGNOSIS — I5032 Chronic diastolic (congestive) heart failure: Secondary | ICD-10-CM

## 2022-04-26 DIAGNOSIS — N183 Chronic kidney disease, stage 3 unspecified: Secondary | ICD-10-CM | POA: Diagnosis not present

## 2022-04-26 DIAGNOSIS — I2510 Atherosclerotic heart disease of native coronary artery without angina pectoris: Secondary | ICD-10-CM | POA: Insufficient documentation

## 2022-04-26 DIAGNOSIS — I2782 Chronic pulmonary embolism: Secondary | ICD-10-CM | POA: Insufficient documentation

## 2022-04-26 DIAGNOSIS — I130 Hypertensive heart and chronic kidney disease with heart failure and stage 1 through stage 4 chronic kidney disease, or unspecified chronic kidney disease: Secondary | ICD-10-CM | POA: Diagnosis not present

## 2022-04-26 DIAGNOSIS — J449 Chronic obstructive pulmonary disease, unspecified: Secondary | ICD-10-CM

## 2022-04-26 DIAGNOSIS — Z87891 Personal history of nicotine dependence: Secondary | ICD-10-CM | POA: Insufficient documentation

## 2022-04-26 DIAGNOSIS — Z7901 Long term (current) use of anticoagulants: Secondary | ICD-10-CM | POA: Diagnosis not present

## 2022-04-26 DIAGNOSIS — Z7984 Long term (current) use of oral hypoglycemic drugs: Secondary | ICD-10-CM | POA: Insufficient documentation

## 2022-04-26 LAB — CBC
HCT: 35 % — ABNORMAL LOW (ref 39.0–52.0)
Hemoglobin: 10.9 g/dL — ABNORMAL LOW (ref 13.0–17.0)
MCH: 27.9 pg (ref 26.0–34.0)
MCHC: 31.1 g/dL (ref 30.0–36.0)
MCV: 89.5 fL (ref 80.0–100.0)
Platelets: 197 10*3/uL (ref 150–400)
RBC: 3.91 MIL/uL — ABNORMAL LOW (ref 4.22–5.81)
RDW: 14.6 % (ref 11.5–15.5)
WBC: 4.5 10*3/uL (ref 4.0–10.5)
nRBC: 0 % (ref 0.0–0.2)

## 2022-04-26 LAB — COMPREHENSIVE METABOLIC PANEL
ALT: 23 U/L (ref 0–44)
AST: 32 U/L (ref 15–41)
Albumin: 3.8 g/dL (ref 3.5–5.0)
Alkaline Phosphatase: 89 U/L (ref 38–126)
Anion gap: 8 (ref 5–15)
BUN: 32 mg/dL — ABNORMAL HIGH (ref 8–23)
CO2: 34 mmol/L — ABNORMAL HIGH (ref 22–32)
Calcium: 9.8 mg/dL (ref 8.9–10.3)
Chloride: 96 mmol/L — ABNORMAL LOW (ref 98–111)
Creatinine, Ser: 1.79 mg/dL — ABNORMAL HIGH (ref 0.61–1.24)
GFR, Estimated: 37 mL/min — ABNORMAL LOW (ref >60)
Glucose, Bld: 105 mg/dL — ABNORMAL HIGH (ref 70–99)
Potassium: 3.5 mmol/L (ref 3.5–5.1)
Sodium: 138 mmol/L (ref 135–145)
Total Bilirubin: 0.4 mg/dL (ref 0.3–1.2)
Total Protein: 7.6 g/dL (ref 6.5–8.1)

## 2022-04-26 MED ORDER — TORSEMIDE 20 MG PO TABS: 20.0000 mg | ORAL_TABLET | Freq: Every day | ORAL | 6 refills | Status: DC

## 2022-04-26 MED ORDER — ATORVASTATIN CALCIUM 40 MG PO TABS: 40.0000 mg | ORAL_TABLET | Freq: Every day | ORAL | 6 refills | Status: DC

## 2022-04-26 NOTE — Addendum Note (Signed)
Encounter addended by: Rafael Bihari, FNP on: 04/26/2022 11:17 AM  Actions taken: Clinical Note Signed

## 2022-04-26 NOTE — Telephone Encounter (Signed)
I called patient to review results and recommendations.  I have updated medlist in [facility] and added lab appt for Sept 22nd.  Patient is aware and agreeable.

## 2022-04-26 NOTE — Patient Instructions (Addendum)
Thank you for coming in today  Labs were done today, if any labs are abnormal the clinic will call you No news is good news  EKG was done today  Your physician recommends that you schedule a follow-up appointment in:  2 months with Dr. Scarlette Shorts WEAR YOUR COMPRESSION HOSE    Do the following things EVERYDAY: Weigh yourself in the morning before breakfast. Write it down and keep it in a log. Take your medicines as prescribed Eat low salt foods--Limit salt (sodium) to 2000 mg per day.  Stay as active as you can everyday Limit all fluids for the day to less than 2 liters  At the [facility], you and your health needs are our priority. As part of our continuing mission to provide you with exceptional heart care, we have created designated Provider Care Teams. These Care Teams include your primary Cardiologist (physician) and Advanced Practice Providers (APPs- Physician Assistants and Nurse Practitioners) who all work together to provide you with the care you need, when you need it.   You may see any of the following providers on your designated Care Team at your next follow up: Dr Glori Bickers Dr Loralie Champagne Dr. Roxana Hires, NP Lyda Jester, [place] [facility] [place], [place] Forestine Na, NP Audry Riles, PharmD   Please be sure to bring in all your medications bottles to every appointment.   If you have any questions or concerns before your next appointment please send [place] a message through [place] or call our office at [phone].    TO LEAVE A MESSAGE FOR THE NURSE SELECT OPTION 2, PLEASE LEAVE A MESSAGE INCLUDING: YOUR NAME DATE OF BIRTH CALL BACK NUMBER REASON FOR CALL**this is important as we prioritize the call backs  YOU WILL RECEIVE A CALL BACK THE SAME DAY AS LONG AS YOU CALL BEFORE 4:00 PM

## 2022-04-26 NOTE — Telephone Encounter (Signed)
-----  Message from Rafael Bihari, [place] sent at 04/26/2022  1:42 PM EDT ----- Renal function elevated. Hold torsemide x 2 days, then restart at lower dose of 20 mg daily.  Repeat BMET in 2 weeks

## 2022-04-30 ENCOUNTER — Encounter (HOSPITAL_COMMUNITY)
Admission: RE | Admit: 2022-04-30 | Discharge: 2022-04-30 | Disposition: A | Discharge status: Home / Self Care | Payer: Medicare Other | Source: Ambulatory Visit | Attending: Pulmonary Disease | Admitting: Pulmonary Disease

## 2022-04-30 DIAGNOSIS — J449 Chronic obstructive pulmonary disease, unspecified: Secondary | ICD-10-CM

## 2022-04-30 DIAGNOSIS — Z5189 Encounter for other specified aftercare: Secondary | ICD-10-CM | POA: Diagnosis not present

## 2022-04-30 NOTE — Progress Notes (Signed)
Daily Session Note  Patient Details  Name: Joshua [name] MRN: [id number] Date of Birth: 1936-03-05 Referring Provider:   April Manson Pulmonary Rehab Walk Test from 03/13/2022 in [place]  Referring Provider Elsworth Soho       Encounter Date: 04/30/2022  Check In:  Session Check In - 04/30/22 1127       Check-In   Supervising physician immediately available to respond to emergencies [facility] immediately available    Physician(s) Dr. Erskine Emery    Location [facility]    Staff Present Rosebud Poles, RN, BSN;Carlette Wilber Oliphant, RN, BSN;Jannatul Wojdyla BS, ACSM-CEP, Exercise Physiologist;Kaylee Rosana Hoes, MS, ACSM-CEP, Exercise Physiologist    Virtual Visit No    Medication changes reported     No    Fall or balance concerns reported    No    Tobacco Cessation No Change    Warm-up and Cool-down Performed as group-led instruction    Resistance Training Performed Yes    VAD Patient? No    PAD/SET Patient? No      Pain Assessment   Currently in Pain? No/denies    Multiple Pain Sites No             Capillary Blood Glucose: No results found for this or any previous visit (from the past 24 hour(s)).    Social History   Tobacco Use  Smoking Status Former   Packs/day: 1.00   Years: 50.00   Total pack years: 50.00   Types: Cigarettes   Start date: 55   Quit date: 08/19/2006   Years since quitting: 15.7  Smokeless Tobacco Never  Tobacco Comments   Former smoke 03/15/22    Goals Met:  Independence with exercise equipment Exercise tolerated well No report of concerns or symptoms today Strength training completed today  Goals Unmet:  Not Applicable  Comments: Service time is from 1010 to 1145.    Dr. Rodman Pickle is Medical Director for Pulmonary Rehab at [facility].

## 2022-05-02 ENCOUNTER — Encounter (HOSPITAL_COMMUNITY)
Admission: RE | Admit: 2022-05-02 | Discharge: 2022-05-02 | Disposition: A | Discharge status: Home / Self Care | Payer: Medicare Other | Source: Ambulatory Visit | Attending: Pulmonary Disease | Admitting: Pulmonary Disease

## 2022-05-02 DIAGNOSIS — Z5189 Encounter for other specified aftercare: Secondary | ICD-10-CM | POA: Diagnosis not present

## 2022-05-02 DIAGNOSIS — J449 Chronic obstructive pulmonary disease, unspecified: Secondary | ICD-10-CM

## 2022-05-02 NOTE — Progress Notes (Signed)
Daily Session Note  Patient Details  Name: Joshua [name] MRN: [id number] Date of Birth: Dec 22, 1935 Referring Provider:   April Manson Pulmonary Rehab Walk Test from 03/13/2022 in [place]  Referring Provider Elsworth Soho       Encounter Date: 05/02/2022  Check In:  Session Check In - 05/02/22 1122       Check-In   Supervising physician immediately available to respond to emergencies [facility] MD immediately available    Physician(s) Dr. Erskine Emery    [facility]    Staff Present Elmon Else, MS, ACSM-CEP, Exercise Physiologist;Randi Yevonne Pax, ACSM-CEP, Exercise Physiologist;Lisa Ysidro Evert, RN    Virtual Visit No    Medication changes reported     No    Fall or balance concerns reported    No    Tobacco Cessation No Change    Warm-up and Cool-down Performed as group-led instruction    Resistance Training Performed Yes    VAD Patient? No    PAD/SET Patient? No      Pain Assessment   Currently in Pain? No/denies    Multiple Pain Sites No             Capillary Blood Glucose: No results found for this or any previous visit (from the past 24 hour(s)).    Social History   Tobacco Use  Smoking Status Former   Packs/day: 1.00   Years: 50.00   Total pack years: 50.00   Types: Cigarettes   Start date: 35   Quit date: 08/19/2006   Years since quitting: 15.7  Smokeless Tobacco Never  Tobacco Comments   Former smoke 03/15/22    Goals Met:  Proper associated with RPD/PD & O2 Sat Exercise tolerated well No report of concerns or symptoms today Strength training completed today  Goals Unmet:  Not Applicable  Comments: Service time is from 1018 to 1143.    Dr. Rodman Pickle is Medical Director for Pulmonary Rehab at [facility].

## 2022-05-07 ENCOUNTER — Encounter (HOSPITAL_COMMUNITY)
Admission: RE | Admit: 2022-05-07 | Discharge: 2022-05-07 | Disposition: A | Discharge status: Home / Self Care | Payer: Medicare Other | Source: Ambulatory Visit | Attending: Pulmonary Disease | Admitting: Pulmonary Disease

## 2022-05-07 VITALS — Wt 178.8 lb

## 2022-05-07 DIAGNOSIS — J449 Chronic obstructive pulmonary disease, unspecified: Secondary | ICD-10-CM | POA: Diagnosis not present

## 2022-05-07 DIAGNOSIS — Z5189 Encounter for other specified aftercare: Secondary | ICD-10-CM | POA: Diagnosis not present

## 2022-05-07 NOTE — Progress Notes (Signed)
Daily Session Note  Patient Details  Name: RIGHTEOUS CLAIBORNE MRN: [id number] Date of Birth: August 19, 1936 Referring Provider:   April Manson Pulmonary Rehab Walk Test from 03/13/2022 in [place]  Referring Provider Elsworth Soho       Encounter Date: 05/07/2022  Check In:  Session Check In - 05/07/22 1146       Check-In   Supervising physician immediately available to respond to emergencies [facility] - Physician supervision    Physician(s) Dr. Erskine Emery    [facility]    Staff Present Elmon Else, MS, ACSM-CEP, Exercise Physiologist;Randi Yevonne Pax, ACSM-CEP, Exercise Physiologist;Carlette Wilber Oliphant, RN, Milus Glazier, MS, ACSM-CEP, CCRP, Exercise Physiologist    Virtual Visit No    Medication changes reported     No    Fall or balance concerns reported    No    Tobacco Cessation No Change    Warm-up and Cool-down Performed as group-led instruction    Resistance Training Performed Yes    VAD Patient? No    PAD/SET Patient? No      Pain Assessment   Currently in Pain? No/denies    Pain Score 0-No pain    Multiple Pain Sites No             Capillary Blood Glucose: No results found for this or any previous visit (from the past 24 hour(s)).   Exercise Prescription Changes - 05/07/22 1100       Response to Exercise   Blood Pressure (Admit) 111/58    Blood Pressure (Exercise) 120/70    Blood Pressure (Exit) 104/64    Heart Rate (Admit) 78 bpm    Heart Rate (Exercise) 100 bpm    Heart Rate (Exit) 84 bpm    Oxygen Saturation (Admit) 98 %    Oxygen Saturation (Exercise) 90 %    Oxygen Saturation (Exit) 96 %    Rating of Perceived Exertion (Exercise) 13    Perceived Dyspnea (Exercise) 2    Duration Continue with 30 min of aerobic exercise without signs/symptoms of physical distress.    Intensity THRR unchanged      Progression   Progression Continue to progress workloads to maintain intensity without signs/symptoms of  physical distress.      Resistance Training   Training Prescription Yes    Weight blue bands    Reps 10-15    Time 10 Minutes      Oxygen   Oxygen Continuous    Liters 3      NuStep   Level 3    SPM 80    Minutes 15    METs 2.2      Track   Laps 13    Minutes 15    METs 2.51      Oxygen   Maintain Oxygen Saturation 88% or higher             Social History   Tobacco Use  Smoking Status Former   Packs/day: 1.00   Years: 50.00   Total pack years: 50.00   Types: Cigarettes   Start date: 47   Quit date: 08/19/2006   Years since quitting: 15.7  Smokeless Tobacco Never  Tobacco Comments   Former smoke 03/15/22    Goals Met:  Proper associated with RPD/PD & O2 Sat Exercise tolerated well No report of concerns or symptoms today Strength training completed today  Goals Unmet:  Not Applicable  Comments: Service time is from 1017 to 1137.  Dr. Jane Ellison is Medical Director for Pulmonary Rehab at [facility].  

## 2022-05-09 ENCOUNTER — Encounter (HOSPITAL_COMMUNITY)
Admission: RE | Admit: 2022-05-09 | Discharge: 2022-05-09 | Disposition: A | Discharge status: Home / Self Care | Payer: Medicare Other | Source: Ambulatory Visit | Attending: Pulmonary Disease | Admitting: Pulmonary Disease

## 2022-05-09 DIAGNOSIS — J449 Chronic obstructive pulmonary disease, unspecified: Secondary | ICD-10-CM

## 2022-05-09 DIAGNOSIS — Z5189 Encounter for other specified aftercare: Secondary | ICD-10-CM | POA: Diagnosis not present

## 2022-05-09 NOTE — Progress Notes (Signed)
Daily Session Note  Patient Details  Name: Joshua [name] MRN: [id number] Date of Birth: [birth year] Referring Provider:   April Manson Pulmonary Rehab Walk Test from 03/13/2022 in [place]  Referring Provider Elsworth Soho       Encounter Date: 05/09/2022  Check In:  Session Check In - 05/09/22 1138       Check-In   Supervising physician immediately available to respond to emergencies [facility] - Physician supervision    Physician(s) Erskine Emery    Location [facility]    Staff Present Maurice Small, RN, BSN;Ramon Dredge, RN, MHA;Samantha [place], RD, Philip Aspen, MS, ACSM-CEP, Exercise Physiologist;Zak Gondek Yevonne Pax, ACSM-CEP, Exercise Physiologist;Jetta Gilford Rile BS, ACSM-CEP, Exercise Physiologist;Bailey Pearline Cables, MS, Exercise Physiologist    Virtual Visit No    Medication changes reported     No    Fall or balance concerns reported    No    Tobacco Cessation No Change    Warm-up and Cool-down Performed as group-led instruction    Resistance Training Performed Yes    VAD Patient? No    PAD/SET Patient? No      Pain Assessment   Currently in Pain? No/denies             Capillary Blood Glucose: No results found for this or any previous visit (from the past 24 hour(s)).    Social History   Tobacco Use  Smoking Status Former   Packs/day: 1.00   Years: 50.00   Total pack years: 50.00   Types: Cigarettes   Start date: 74   Quit date: 08/19/2006   Years since quitting: 15.7  Smokeless Tobacco Never  Tobacco Comments   Former smoke 03/15/22    Goals Met:  Independence with exercise equipment Exercise tolerated well No report of concerns or symptoms today Strength training completed today  Goals Unmet:  Not Applicable  Comments: Service time is from 1012 to 1145.    Dr. Rodman Pickle is Medical Director for Pulmonary Rehab at [facility].

## 2022-05-10 ENCOUNTER — Ambulatory Visit (HOSPITAL_COMMUNITY)
Admission: RE | Admit: 2022-05-10 | Discharge: 2022-05-10 | Disposition: A | Discharge status: Home / Self Care | Payer: Medicare Other | Source: Ambulatory Visit | Attending: Internal Medicine | Admitting: Internal Medicine

## 2022-05-10 DIAGNOSIS — I5032 Chronic diastolic (congestive) heart failure: Secondary | ICD-10-CM | POA: Diagnosis not present

## 2022-05-10 LAB — BASIC METABOLIC PANEL
Anion gap: 10 (ref 5–15)
BUN: 21 mg/dL (ref 8–23)
CO2: 29 mmol/L (ref 22–32)
Calcium: 9.8 mg/dL (ref 8.9–10.3)
Chloride: 95 mmol/L — ABNORMAL LOW (ref 98–111)
Creatinine, Ser: 1.52 mg/dL — ABNORMAL HIGH (ref 0.61–1.24)
GFR, Estimated: 45 mL/min — ABNORMAL LOW (ref >60)
Glucose, Bld: 93 mg/dL (ref 70–99)
Potassium: 3.8 mmol/L (ref 3.5–5.1)
Sodium: 134 mmol/L — ABNORMAL LOW (ref 135–145)

## 2022-05-14 ENCOUNTER — Encounter (HOSPITAL_COMMUNITY)
Admission: RE | Admit: 2022-05-14 | Discharge: 2022-05-14 | Disposition: A | Discharge status: Home / Self Care | Payer: Medicare Other | Source: Ambulatory Visit | Attending: Pulmonary Disease | Admitting: Pulmonary Disease

## 2022-05-14 DIAGNOSIS — J449 Chronic obstructive pulmonary disease, unspecified: Secondary | ICD-10-CM

## 2022-05-14 DIAGNOSIS — Z5189 Encounter for other specified aftercare: Secondary | ICD-10-CM | POA: Diagnosis not present

## 2022-05-14 NOTE — Progress Notes (Signed)
Daily Session Note  Patient Details  Name: Joshua [name] MRN: [id number] Date of Birth: July 26, 1936 Referring Provider:   April Manson Pulmonary Rehab Walk Test from 03/13/2022 in [place]  Referring Provider Elsworth Soho       Encounter Date: 05/14/2022  Check In:  Session Check In - 05/14/22 1159       Check-In   Supervising physician immediately available to respond to emergencies [facility] - Physician supervision    Physician(s) Dr. Ernest Mallick    Location [facility]    Staff Present Elmon Else, MS, ACSM-CEP, Exercise Physiologist;Lisa Ysidro Evert, RN;Randi Olen Cordial BS, ACSM-CEP, Exercise Physiologist    Virtual Visit No    Medication changes reported     No    Fall or balance concerns reported    No    Tobacco Cessation No Change    Warm-up and Cool-down Performed as group-led instruction    Resistance Training Performed Yes    VAD Patient? No    PAD/SET Patient? No      Pain Assessment   Currently in Pain? No/denies    Multiple Pain Sites No             Capillary Blood Glucose: No results found for this or any previous visit (from the past 24 hour(s)).    Social History   Tobacco Use  Smoking Status Former   Packs/day: 1.00   Years: 50.00   Total pack years: 50.00   Types: Cigarettes   Start date: 48   Quit date: 08/19/2006   Years since quitting: 15.7  Smokeless Tobacco Never  Tobacco Comments   Former smoke 03/15/22    Goals Met:  Proper associated with RPD/PD & O2 Sat Independence with exercise equipment Exercise tolerated well No report of concerns or symptoms today Strength training completed today  Goals Unmet:  Not Applicable  Comments: Service time is from 1016 to 1144.    Dr. Rodman Pickle is Medical Director for Pulmonary Rehab at [facility].

## 2022-05-15 DIAGNOSIS — R066 Hiccough: Secondary | ICD-10-CM | POA: Diagnosis not present

## 2022-05-15 DIAGNOSIS — R634 Abnormal weight loss: Secondary | ICD-10-CM | POA: Diagnosis not present

## 2022-05-15 NOTE — Progress Notes (Signed)
Pulmonary Individual Treatment Plan  Patient Details  Name: Joshua [name] MRN: [id number] Date of Birth: 03/22/1936 Referring Provider:   April Manson Pulmonary Rehab Walk Test from 03/13/2022 in [place]  Referring Provider Elsworth Soho       Initial Encounter Date:  Flowsheet Row Pulmonary Rehab Walk Test from 03/13/2022 in [facility]  Date 03/13/22       Visit Diagnosis: Stage 2 moderate COPD by GOLD classification ([place])  Patient's Home Medications on Admission:   Current Outpatient Medications:    albuterol (VENTOLIN HFA) 108 (90 Base) MCG/ACT inhaler, Inhale 1 puff into the lungs every 4 (four) hours as needed for shortness of breath., Disp: , Rfl:    allopurinol (ZYLOPRIM) 100 MG tablet, Take 100 mg by mouth every morning., Disp: , Rfl:    atorvastatin (LIPITOR) 40 MG tablet, Take 1 tablet (40 mg total) by mouth daily., Disp: 30 tablet, Rfl: 6   Azelastine HCl 0.15 % SOLN, Place 2 sprays into the nose daily as needed (allergies)., Disp: , Rfl:    Cinnamon 500 MG capsule, Take 500 mg by mouth every morning., Disp: , Rfl:    ELIQUIS 5 MG TABS tablet, TAKE 1 TABLET BY MOUTH TWICE A DAY, Disp: 60 tablet, Rfl: 11   finasteride (PROSCAR) 5 MG tablet, Take 5 mg by mouth at bedtime., Disp: , Rfl:    gabapentin (NEURONTIN) 100 MG capsule, Take 300 mg by mouth 3 (three) times daily., Disp: , Rfl:    magnesium oxide (MAG-OX) 400 MG tablet, Take 1 tablet (400 mg total) by mouth daily., Disp: 30 tablet, Rfl: 3   metoCLOPramide (REGLAN) 5 MG tablet, Take 5 mg by mouth 3 (three) times daily before meals., Disp: , Rfl:    midodrine (PROAMATINE) 10 MG tablet, Take 1.5 tablets (15 mg total) by mouth 3 (three) times daily with meals., Disp: 90 tablet, Rfl: 3   Multiple Vitamin (MULTIVITAMIN WITH MINERALS) TABS tablet, Take 1 tablet by mouth daily., Disp: 30 tablet, Rfl: 0   ondansetron (ZOFRAN) 4 MG tablet, Take 1 tablet (4 mg total) by  mouth every 6 (six) hours as needed for nausea., Disp: 20 tablet, Rfl: 0   OXYGEN, Inhale 2 L into the lungs continuous., Disp: , Rfl:    pantoprazole (PROTONIX) 40 MG tablet, Take 40 mg by mouth every morning., Disp: , Rfl:    Riociguat 2 MG TABS, Take 2 mg by mouth in the morning, at noon, and at bedtime., Disp: 90 tablet, Rfl: 11   STIOLTO RESPIMAT 2.5-2.5 MCG/ACT AERS, INHALE 2 PUFFS BY MOUTH INTO THE LUNGS DAILY (Patient taking differently: Inhale 2 each into the lungs daily.), Disp: 4 g, Rfl: 5   torsemide (DEMADEX) 20 MG tablet, Take 1 tablet (20 mg total) by mouth daily., Disp: 30 tablet, Rfl: 6  Past Medical History: Past Medical History:  Diagnosis Date   Arthritis    BPH (benign prostatic hyperplasia)    Chronic hiccups    COPD (chronic obstructive pulmonary disease) (HCC)    Folliculitis    posterior scalp per office visit note of Dr Maudie Mercury 07/20/2014     GERD (gastroesophageal reflux disease)    Gout    Hypertension    Hypotension    PE (pulmonary thromboembolism) ([place])    Phlebitis    right arm  at least 20 years ago    Pneumonia    hx of pneumonia as a child    Pulmonary hypertension (  [place])    Sleep apnea     Tobacco Use: Social History   Tobacco Use  Smoking Status Former   Packs/day: 1.00   Years: 50.00   Total pack years: 50.00   Types: Cigarettes   Start date: 84   Quit date: 08/19/2006   Years since quitting: 15.7  Smokeless Tobacco Never  Tobacco Comments   Former smoke 03/15/22    Labs: Review Flowsheet  More data exists      Latest Ref Rng & Units 10/04/2021 03/16/2022 03/17/2022 03/20/2022 03/21/2022  Labs for ITP Cardiac and Pulmonary Rehab  Cholestrol 0 - 200 mg/dL - - - 76  -  LDL (calc) 0 - 99 mg/dL - - - 41  -  HDL-C >40 mg/dL - - - 28  -  Trlycerides <150 mg/dL - - - 35  -  Hemoglobin A1c 4.8 - 5.6 % - 5.0  - - -  Bicarbonate 20.0 - 28.0 mmol/L 20.0 - 28.0 mmol/L 33.4  - - - 30.6  31.0   TCO2 22 - 32 mmol/L 22 - 32 mmol/L 34  - - - 32  33    O2 Saturation % % 83  - 60.5  - 63  64     Capillary Blood Glucose: Lab Results  Component Value Date   GLUCAP 87 03/21/2022   GLUCAP 74 03/20/2022   GLUCAP 83 03/19/2022   GLUCAP 97 03/18/2022   GLUCAP 98 03/17/2022     Pulmonary Assessment Scores:  Pulmonary Assessment Scores     Row Name 03/13/22 1200 05/04/22 0945       ADL UCSD   ADL Phase Entry Exit    SOB Score total 48 53      CAT Score   CAT Score 20 17      mMRC Score   mMRC Score 1 --            UCSD: Self-administered rating of dyspnea associated with activities of daily living (ADLs) 6-point scale (0 = "not at all" to 5 = "maximal or unable to do because of breathlessness")  Scoring Scores range from 0 to 120.  Minimally important difference is 5 units  CAT: CAT can identify the health impairment of COPD patients and is better correlated with disease progression.  CAT has a scoring range of zero to 40. The CAT score is classified into four groups of low (less than 10), medium (10 - 20), high (21-30) and very high (31-40) based on the impact level of disease on health status. A CAT score over 10 suggests significant symptoms.  A worsening CAT score could be explained by an exacerbation, poor medication adherence, poor inhaler technique, or progression of COPD or comorbid conditions.  CAT MCID is 2 points  mMRC: mMRC (Modified Medical Research Council) Dyspnea Scale is used to assess the degree of baseline functional disability in patients of respiratory disease due to dyspnea. No minimal important difference is established. A decrease in score of 1 point or greater is considered a positive change.   Pulmonary Function Assessment:   Exercise Target Goals: Exercise Program Goal: Individual exercise prescription set using results from initial 6 min walk test and THRR while considering  patient's activity barriers and safety.   Exercise Prescription Goal: Initial exercise prescription builds to  30-45 minutes a day of aerobic activity, 2-3 days per week.  Home exercise guidelines will be given to patient during program as part of exercise prescription that the participant will acknowledge.  Activity Barriers & Risk Stratification:  Activity Barriers & Cardiac Risk Stratification - 03/13/22 1115       Activity Barriers & Cardiac Risk Stratification   Activity Barriers Left Knee Replacement   LTKR in 2016 does not bother him at all now   Cardiac Risk Stratification Moderate   Has diastolic heart failure and Pulmonary hypertension            6 Minute Walk:  6 Minute Walk     Row Name 03/13/22 1216         6 Minute Walk   Phase Initial     Distance 1110 feet     Walk Time 6 minutes     # of Rest Breaks 1  4:10-4:40     MPH 2.1     METS 2.3     RPE 12     Perceived Dyspnea  2     VO2 Peak 8.05     Symptoms No     Resting HR 98 bpm     Resting BP 114/70     Resting Oxygen Saturation  95 %     Exercise Oxygen Saturation  during 6 min walk 88 %     Max Ex. HR 112 bpm     Max Ex. BP 116/66     2 Minute Post BP 112/60       Interval HR   1 Minute HR 84     2 Minute HR 94     3 Minute HR 100     4 Minute HR 98     5 Minute HR 112     6 Minute HR 104     2 Minute Post HR 83     Interval Heart Rate? Yes       Interval Oxygen   Interval Oxygen? Yes     Baseline Oxygen Saturation % 95 %  2L     1 Minute Oxygen Saturation % 99 %     1 Minute Liters of Oxygen 2 L     2 Minute Oxygen Saturation % 95 %     2 Minute Liters of Oxygen 2 L     3 Minute Oxygen Saturation % 91 %     3 Minute Liters of Oxygen 2 L     4 Minute Oxygen Saturation % 88 %     4 Minute Liters of Oxygen 2 L     5 Minute Oxygen Saturation % 90 %     5 Minute Liters of Oxygen 2 L     6 Minute Oxygen Saturation % 90 %     6 Minute Liters of Oxygen 2 L     2 Minute Post Oxygen Saturation % 98 %     2 Minute Post Liters of Oxygen 2 L              Oxygen Initial Assessment:  Oxygen  Initial Assessment - 03/13/22 1150       Home Oxygen   Home Oxygen Device Home Concentrator;Portable Concentrator    Sleep Oxygen Prescription Continuous    Liters per minute 2    Home Exercise Oxygen Prescription Pulsed    Liters per minute 2    Home Resting Oxygen Prescription None    Compliance with Home Oxygen Use Yes      Initial 6 min Walk   Oxygen Used Continuous    Liters per minute 2      Program Oxygen Prescription  Program Oxygen Prescription Continuous    Liters per minute 2      Intervention   Short Term Goals To learn and exhibit compliance with exercise, home and travel O2 prescription;To learn and understand importance of monitoring SPO2 with pulse oximeter and demonstrate accurate use of the pulse oximeter.;To learn and understand importance of maintaining oxygen saturations>88%;To learn and demonstrate proper pursed lip breathing techniques or other breathing techniques. ;To learn and demonstrate proper use of respiratory medications    Long  Term Goals Exhibits compliance with exercise, home  and travel O2 prescription;Verbalizes importance of monitoring SPO2 with pulse oximeter and return demonstration;Maintenance of O2 saturations>88%;Exhibits proper breathing techniques, such as pursed lip breathing or other method taught during program session;Compliance with respiratory medication;Demonstrates proper use of MDI's             Oxygen Re-Evaluation:  Oxygen Re-Evaluation     Row Name 03/20/22 1155 04/17/22 1100 05/13/22 0730         Program Oxygen Prescription   Program Oxygen Prescription Continuous Continuous Continuous     Liters per minute _0 Home Oxygen   Home Oxygen Device Home Concentrator;Portable Concentrator Home Concentrator;Portable Concentrator Home Concentrator;Portable Concentrator     Sleep Oxygen Prescription Continuous Continuous Continuous     Liters per minute _1 Home Exercise Oxygen Prescription Pulsed Pulsed  Pulsed     Liters per minute _2 Home Resting Oxygen Prescription None None None     Compliance with Home Oxygen Use Yes Yes Yes       Goals/Expected Outcomes   Short Term Goals To learn and exhibit compliance with exercise, home and travel O2 prescription;To learn and understand importance of monitoring SPO2 with pulse oximeter and demonstrate accurate use of the pulse oximeter.;To learn and understand importance of maintaining oxygen saturations>88%;To learn and demonstrate proper pursed lip breathing techniques or other breathing techniques. ;To learn and demonstrate proper use of respiratory medications To learn and exhibit compliance with exercise, home and travel O2 prescription;To learn and understand importance of monitoring SPO2 with pulse oximeter and demonstrate accurate use of the pulse oximeter.;To learn and understand importance of maintaining oxygen saturations>88%;To learn and demonstrate proper pursed lip breathing techniques or other breathing techniques. ;To learn and demonstrate proper use of respiratory medications To learn and exhibit compliance with exercise, home and travel O2 prescription;To learn and understand importance of monitoring SPO2 with pulse oximeter and demonstrate accurate use of the pulse oximeter.;To learn and understand importance of maintaining oxygen saturations>88%;To learn and demonstrate proper pursed lip breathing techniques or other breathing techniques. ;To learn and demonstrate proper use of respiratory medications     Long  Term Goals Exhibits compliance with exercise, home  and travel O2 prescription;Verbalizes importance of monitoring SPO2 with pulse oximeter and return demonstration;Maintenance of O2 saturations>88%;Exhibits proper breathing techniques, such as pursed lip breathing or other method taught during program session;Compliance with respiratory medication;Demonstrates proper use of MDI's Exhibits compliance with exercise, home  and travel  O2 prescription;Verbalizes importance of monitoring SPO2 with pulse oximeter and return demonstration;Maintenance of O2 saturations>88%;Exhibits proper breathing techniques, such as pursed lip breathing or other method taught during program session;Compliance with respiratory medication;Demonstrates proper use of MDI's Exhibits compliance with exercise, home  and travel O2 prescription;Verbalizes importance of monitoring SPO2 with pulse oximeter and return demonstration;Maintenance of O2 saturations>88%;Exhibits proper breathing techniques, such as pursed lip breathing or other  method taught during program session;Compliance with respiratory medication;Demonstrates proper use of MDI's     Goals/Expected Outcomes Compliance and understanding of oxygen saturation monitoring and breathing techniques to decrease shortness of breath. Compliance and understanding of oxygen saturation monitoring and breathing techniques to decrease shortness of breath. Compliance and understanding of oxygen saturation monitoring and breathing techniques to decrease shortness of breath.              Oxygen Discharge (Final Oxygen Re-Evaluation):  Oxygen Re-Evaluation - 05/13/22 0730       Program Oxygen Prescription   Program Oxygen Prescription Continuous    Liters per minute 2      Home Oxygen   Home Oxygen Device Home Concentrator;Portable Concentrator    Sleep Oxygen Prescription Continuous    Liters per minute 2    Home Exercise Oxygen Prescription Pulsed    Liters per minute 2    Home Resting Oxygen Prescription None    Compliance with Home Oxygen Use Yes      Goals/Expected Outcomes   Short Term Goals To learn and exhibit compliance with exercise, home and travel O2 prescription;To learn and understand importance of monitoring SPO2 with pulse oximeter and demonstrate accurate use of the pulse oximeter.;To learn and understand importance of maintaining oxygen saturations>88%;To learn and demonstrate proper  pursed lip breathing techniques or other breathing techniques. ;To learn and demonstrate proper use of respiratory medications    Long  Term Goals Exhibits compliance with exercise, home  and travel O2 prescription;Verbalizes importance of monitoring SPO2 with pulse oximeter and return demonstration;Maintenance of O2 saturations>88%;Exhibits proper breathing techniques, such as pursed lip breathing or other method taught during program session;Compliance with respiratory medication;Demonstrates proper use of MDI's    Goals/Expected Outcomes Compliance and understanding of oxygen saturation monitoring and breathing techniques to decrease shortness of breath.             Initial Exercise Prescription:  Initial Exercise Prescription - 03/13/22 1200       Date of Initial Exercise RX and Referring Provider   Date 03/13/22    Referring Provider Elsworth Soho    Expected Discharge Date 05/16/22      Oxygen   Oxygen Continuous    Liters 2    Maintain Oxygen Saturation 88% or higher      NuStep   Level 2    SPM 80    Minutes 15      Track   Minutes 15    METs 2.3      Prescription Details   Frequency (times per week) 2    Duration Progress to 30 minutes of continuous aerobic without signs/symptoms of physical distress      Intensity   THRR 40-80% of Max Heartrate 54-109    Ratings of Perceived Exertion 11-13    Perceived Dyspnea 0-4      Progression   Progression Continue progressive overload as per policy without signs/symptoms or physical distress.      Resistance Training   Training Prescription Yes    Weight blue bands    Reps 10-15             Perform Capillary Blood Glucose checks as needed.  Exercise Prescription Changes:   Exercise Prescription Changes     Row Name 03/26/22 1500 03/28/22 1100 04/09/22 1200 04/16/22 1200 04/23/22 1200     Response to Exercise   Blood Pressure (Admit) 90/50 -- 96/68 -- 90/50   Blood Pressure (Exercise) 102/50 -- 126/58 -- 108/50  Blood Pressure (Exit) 100/56 -- 120/70 -- 108/50   Heart Rate (Admit) 80 bpm -- 81 bpm -- 84 bpm   Heart Rate (Exercise) 107 bpm -- 93 bpm -- 94 bpm   Heart Rate (Exit) 75 bpm -- 97 bpm -- 76 bpm   Oxygen Saturation (Admit) 98 % -- 99 % -- 99 %   Oxygen Saturation (Exercise) 94 % -- 91 % -- 90 %   Oxygen Saturation (Exit) 98 % -- 97 % -- 96 %   Rating of Perceived Exertion (Exercise) 12 -- 12 -- 12   Perceived Dyspnea (Exercise) 1 -- 1 -- 1   Duration Continue with 30 min of aerobic exercise without signs/symptoms of physical distress. -- Continue with 30 min of aerobic exercise without signs/symptoms of physical distress. -- Continue with 30 min of aerobic exercise without signs/symptoms of physical distress.   Intensity THRR unchanged -- THRR unchanged -- THRR unchanged     Progression   Progression Continue to progress workloads to maintain intensity without signs/symptoms of physical distress. -- Continue to progress workloads to maintain intensity without signs/symptoms of physical distress. -- Continue to progress workloads to maintain intensity without signs/symptoms of physical distress.     Resistance Training   Training Prescription Yes Yes Yes -- Yes   Weight blue bands change to red bands red bands -- red bands   Reps 10-15 10-15 10-15 -- 10-15   Time 10 Minutes 10 Minutes 10 Minutes -- 10 Minutes     Oxygen   Oxygen Continuous -- Continuous -- Continuous   Liters 2 -- 2 -- 2     NuStep   Level 1 -- 1 -- 2   SPM 80 -- 80 -- 80   Minutes 30 -- 15 -- 15   METs 1.6 -- -- -- 2     Track   Laps -- -- 12 -- 11   Minutes -- -- 15 -- 15   METs -- -- 2.39 -- 2.28     Home Exercise Plan   Plans to continue exercise at -- -- -- Home (comment)  stationary bicycle --   Frequency -- -- -- --  n/a --   Initial Home Exercises Provided -- -- -- 04/16/22 --     Oxygen   Maintain Oxygen Saturation 88% or higher -- -- -- 88% or higher    Row Name 05/07/22 1100              Response to Exercise   Blood Pressure (Admit) 111/58       Blood Pressure (Exercise) 120/70       Blood Pressure (Exit) 104/64       Heart Rate (Admit) 78 bpm       Heart Rate (Exercise) 100 bpm       Heart Rate (Exit) 84 bpm       Oxygen Saturation (Admit) 98 %       Oxygen Saturation (Exercise) 90 %       Oxygen Saturation (Exit) 96 %       Rating of Perceived Exertion (Exercise) 13       Perceived Dyspnea (Exercise) 2       Duration Continue with 30 min of aerobic exercise without signs/symptoms of physical distress.       Intensity THRR unchanged         Progression   Progression Continue to progress workloads to maintain intensity without signs/symptoms of physical distress.  Resistance Training   Training Prescription Yes       Weight blue bands       Reps 10-15       Time 10 Minutes         Oxygen   Oxygen Continuous       Liters 3         NuStep   Level 3       SPM 80       Minutes 15       METs 2.2         Track   Laps 13       Minutes 15       METs 2.51         Oxygen   Maintain Oxygen Saturation 88% or higher                Exercise Comments:   Exercise Comments     Row Name 03/26/22 1222 04/16/22 1208         Exercise Comments Pt completed first day of exercise. Pt exercised 30 min on the Nustep. He averaged 1.6 METs on level 1. He was encouraged to take deep strides. Tolerated well. [place] performed warmup and cooldown standing with support. He practiced sit to stand option today. Discussed with pt METS with him with fair understanding. Discussed home exercise plan with Juanda Crumble. He is currently exercising on stationary bike for 30 min, 2-3 nonrehab days at home. Encouraged pt to continue.               Exercise Goals and Review:   Exercise Goals     Row Name 03/13/22 1212 03/20/22 1152 04/17/22 1048 05/13/22 0726       Exercise Goals   Increase Physical Activity Yes Yes Yes Yes    Intervention Provide advice, education,  support and counseling about physical activity/exercise needs.;Develop an individualized exercise prescription for aerobic and resistive training based on initial evaluation findings, risk stratification, comorbidities and participant's personal goals. Provide advice, education, support and counseling about physical activity/exercise needs.;Develop an individualized exercise prescription for aerobic and resistive training based on initial evaluation findings, risk stratification, comorbidities and participant's personal goals. Provide advice, education, support and counseling about physical activity/exercise needs.;Develop an individualized exercise prescription for aerobic and resistive training based on initial evaluation findings, risk stratification, comorbidities and participant's personal goals. Provide advice, education, support and counseling about physical activity/exercise needs.;Develop an individualized exercise prescription for aerobic and resistive training based on initial evaluation findings, risk stratification, comorbidities and participant's personal goals.    Expected Outcomes Short Term: Attend rehab on a regular basis to increase amount of physical activity.;Long Term: Add in home exercise to make exercise part of routine and to increase amount of physical activity.;Long Term: Exercising regularly at least 3-5 days a week. Short Term: Attend rehab on a regular basis to increase amount of physical activity.;Long Term: Add in home exercise to make exercise part of routine and to increase amount of physical activity.;Long Term: Exercising regularly at least 3-5 days a week. Short Term: Attend rehab on a regular basis to increase amount of physical activity.;Long Term: Add in home exercise to make exercise part of routine and to increase amount of physical activity.;Long Term: Exercising regularly at least 3-5 days a week. Short Term: Attend rehab on a regular basis to increase amount of physical  activity.;Long Term: Add in home exercise to make exercise part of routine and to increase amount of physical activity.;Long Term:  Exercising regularly at least 3-5 days a week.    Increase Strength and Stamina Yes Yes Yes Yes    Intervention Develop an individualized exercise prescription for aerobic and resistive training based on initial evaluation findings, risk stratification, comorbidities and participant's personal goals.;Provide advice, education, support and counseling about physical activity/exercise needs. Develop an individualized exercise prescription for aerobic and resistive training based on initial evaluation findings, risk stratification, comorbidities and participant's personal goals.;Provide advice, education, support and counseling about physical activity/exercise needs. Develop an individualized exercise prescription for aerobic and resistive training based on initial evaluation findings, risk stratification, comorbidities and participant's personal goals.;Provide advice, education, support and counseling about physical activity/exercise needs. Develop an individualized exercise prescription for aerobic and resistive training based on initial evaluation findings, risk stratification, comorbidities and participant's personal goals.;Provide advice, education, support and counseling about physical activity/exercise needs.    Expected Outcomes Short Term: Increase workloads from initial exercise prescription for resistance, speed, and METs.;Short Term: Perform resistance training exercises routinely during rehab and add in resistance training at home;Long Term: Improve cardiorespiratory fitness, muscular endurance and strength as measured by increased METs and functional capacity (6MWT) Short Term: Increase workloads from initial exercise prescription for resistance, speed, and METs.;Short Term: Perform resistance training exercises routinely during rehab and add in resistance training at  home;Long Term: Improve cardiorespiratory fitness, muscular endurance and strength as measured by increased METs and functional capacity (6MWT) Short Term: Increase workloads from initial exercise prescription for resistance, speed, and METs.;Short Term: Perform resistance training exercises routinely during rehab and add in resistance training at home;Long Term: Improve cardiorespiratory fitness, muscular endurance and strength as measured by increased METs and functional capacity (6MWT) Short Term: Increase workloads from initial exercise prescription for resistance, speed, and METs.;Short Term: Perform resistance training exercises routinely during rehab and add in resistance training at home;Long Term: Improve cardiorespiratory fitness, muscular endurance and strength as measured by increased METs and functional capacity (6MWT)    Able to understand and use rate of perceived exertion (RPE) scale Yes Yes Yes Yes    Intervention Provide education and explanation on how to use RPE scale Provide education and explanation on how to use RPE scale Provide education and explanation on how to use RPE scale Provide education and explanation on how to use RPE scale    Expected Outcomes Short Term: Able to use RPE daily in rehab to express subjective intensity level;Long Term:  Able to use RPE to guide intensity level when exercising independently Short Term: Able to use RPE daily in rehab to express subjective intensity level;Long Term:  Able to use RPE to guide intensity level when exercising independently Short Term: Able to use RPE daily in rehab to express subjective intensity level;Long Term:  Able to use RPE to guide intensity level when exercising independently Short Term: Able to use RPE daily in rehab to express subjective intensity level;Long Term:  Able to use RPE to guide intensity level when exercising independently    Able to understand and use Dyspnea scale Yes Yes Yes Yes    Intervention Provide  education and explanation on how to use Dyspnea scale Provide education and explanation on how to use Dyspnea scale Provide education and explanation on how to use Dyspnea scale Provide education and explanation on how to use Dyspnea scale    Expected Outcomes Short Term: Able to use Dyspnea scale daily in rehab to express subjective sense of shortness of breath during exertion;Long Term: Able to use Dyspnea scale to guide  intensity level when exercising independently Short Term: Able to use Dyspnea scale daily in rehab to express subjective sense of shortness of breath during exertion;Long Term: Able to use Dyspnea scale to guide intensity level when exercising independently Short Term: Able to use Dyspnea scale daily in rehab to express subjective sense of shortness of breath during exertion;Long Term: Able to use Dyspnea scale to guide intensity level when exercising independently Short Term: Able to use Dyspnea scale daily in rehab to express subjective sense of shortness of breath during exertion;Long Term: Able to use Dyspnea scale to guide intensity level when exercising independently    Knowledge and understanding of Target Heart Rate Range (THRR) Yes Yes Yes Yes    Intervention Provide education and explanation of THRR including how the numbers were predicted and where they are located for reference Provide education and explanation of THRR including how the numbers were predicted and where they are located for reference Provide education and explanation of THRR including how the numbers were predicted and where they are located for reference Provide education and explanation of THRR including how the numbers were predicted and where they are located for reference    Expected Outcomes Short Term: Able to state/look up THRR;Long Term: Able to use THRR to govern intensity when exercising independently;Short Term: Able to use daily as guideline for intensity in rehab Short Term: Able to state/look up  THRR;Long Term: Able to use THRR to govern intensity when exercising independently;Short Term: Able to use daily as guideline for intensity in rehab Short Term: Able to state/look up THRR;Long Term: Able to use THRR to govern intensity when exercising independently;Short Term: Able to use daily as guideline for intensity in rehab Short Term: Able to state/look up THRR;Long Term: Able to use THRR to govern intensity when exercising independently;Short Term: Able to use daily as guideline for intensity in rehab    Understanding of Exercise Prescription Yes Yes Yes Yes    Intervention Provide education, explanation, and written materials on patient's individual exercise prescription Provide education, explanation, and written materials on patient's individual exercise prescription Provide education, explanation, and written materials on patient's individual exercise prescription Provide education, explanation, and written materials on patient's individual exercise prescription    Expected Outcomes Short Term: Able to explain program exercise prescription;Long Term: Able to explain home exercise prescription to exercise independently Short Term: Able to explain program exercise prescription;Long Term: Able to explain home exercise prescription to exercise independently Short Term: Able to explain program exercise prescription;Long Term: Able to explain home exercise prescription to exercise independently Short Term: Able to explain program exercise prescription;Long Term: Able to explain home exercise prescription to exercise independently             Exercise Goals Re-Evaluation :  Exercise Goals Re-Evaluation     Row Name 03/20/22 1153 04/17/22 1049 05/13/22 0727         Exercise Goal Re-Evaluation   Exercise Goals Review Increase Physical Activity;Increase Strength and Stamina;Able to understand and use rate of perceived exertion (RPE) scale;Able to understand and use Dyspnea scale;Knowledge and  understanding of Target Heart Rate Range (THRR);Understanding of Exercise Prescription Increase Physical Activity;Increase Strength and Stamina;Able to understand and use rate of perceived exertion (RPE) scale;Able to understand and use Dyspnea scale;Knowledge and understanding of Target Heart Rate Range (THRR);Understanding of Exercise Prescription Increase Physical Activity;Increase Strength and Stamina;Able to understand and use rate of perceived exertion (RPE) scale;Able to understand and use Dyspnea scale;Knowledge and understanding of Target Heart  Rate Range (THRR);Understanding of Exercise Prescription     Comments Crit was scheduled to start exercise this week. Notes show that he has been in the hospital. Will continue to monitor. Carthel has completed 7 exercise sessions. He exercises for 15 min on the track and Nustep. He averages 2.51 METs on the track and 1.8 METs at level 2 on the Nustep. He performs the warmup and cooldown standing without limitations. Anay has progressed to walking the track for 15 min. He has tolerated the track very well. He has also increased his workload on the Nustep. Izzak seems very motivated to exercise and improve his functional capacity. We recently discussed home exercise as Miran is riding his bike at home. Wil continue to monitor and progress able. Marcas has completed 14 exercise sessions. He exercises for 15 min on the track and Nustep. He averages 2.62 METs on the track and 2.3 METs at level 3 on the Nustep. He performs the warmup and cooldown standing without limitations. Yi has progressed on the track and Nustep. He has tolerates porgressions well. Dewarren seems very motivated to exercise and improve his functional capacity. I am confident in Chen[place] carrying out an exercise regimen at home. Will continue to monitor and progress as able.     Expected Outcomes Through exercise at rehab and home, the patient will decrease shortness of breath with  daily activities and feel confident in carrying out an exercise regimen at home. Through exercise at rehab and home, the patient will decrease shortness of breath with daily activities and feel confident in carrying out an exercise regimen at home. Through exercise at rehab and home, the patient will decrease shortness of breath with daily activities and feel confident in carrying out an exercise regimen at home.              Discharge Exercise Prescription (Final Exercise Prescription Changes):  Exercise Prescription Changes - 05/07/22 1100       Response to Exercise   Blood Pressure (Admit) 111/58    Blood Pressure (Exercise) 120/70    Blood Pressure (Exit) 104/64    Heart Rate (Admit) 78 bpm    Heart Rate (Exercise) 100 bpm    Heart Rate (Exit) 84 bpm    Oxygen Saturation (Admit) 98 %    Oxygen Saturation (Exercise) 90 %    Oxygen Saturation (Exit) 96 %    Rating of Perceived Exertion (Exercise) 13    Perceived Dyspnea (Exercise) 2    Duration Continue with 30 min of aerobic exercise without signs/symptoms of physical distress.    Intensity THRR unchanged      Progression   Progression Continue to progress workloads to maintain intensity without signs/symptoms of physical distress.      Resistance Training   Training Prescription Yes    Weight blue bands    Reps 10-15    Time 10 Minutes      Oxygen   Oxygen Continuous    Liters 3      NuStep   Level 3    SPM 80    Minutes 15    METs 2.2      Track   Laps 13    Minutes 15    METs 2.51      Oxygen   Maintain Oxygen Saturation 88% or higher             Nutrition:  Target Goals: Understanding of nutrition guidelines, daily intake of sodium <1544m, cholesterol <2080m calories 30% from fat  and 7% or less from saturated fats, daily to have 5 or more servings of fruits and vegetables.  Biometrics:  Pre Biometrics - 03/13/22 1030       Pre Biometrics   Grip Strength 23 kg              Nutrition  Therapy Plan and Nutrition Goals:  Nutrition Therapy & Goals - 05/14/22 1228       Nutrition Therapy   Diet Heart Healthy Diet    Drug/Food Interactions Statins/Certain Fruits    Protein (specify units) 99g-124g/day (1.2-1.5g/kg actual body weight)      Personal Nutrition Goals   Nutrition Goal Patient to consume a daily variety of fruit, vegetables, lean protein/plant protein, whole grains, and dairy as part of balanced diet.    Personal Goal #2 Patient to understand strategies for weight gain of 0.5-2.0# per week of weight gain.    Personal Goal #3 Patient to limit to <1574m of sodium daily.    Comments Goals in progress. CHoganreports good appetite and eating on a regular schedule. He reports good understanding of sodium intake, low sodium foods, and weight monitoring for for congestive heart failure. His wife remains a good support for dietary intake and disease management. He typically drinks ~1 nutrition supplement daily.      Intervention Plan   Intervention Prescribe, educate and counsel regarding individualized specific dietary modifications aiming towards targeted core components such as weight, hypertension, lipid management, diabetes, heart failure and other comorbidities.;Nutrition handout(s) given to patient.    Expected Outcomes Short Term Goal: Understand basic principles of dietary content, such as calories, fat, sodium, cholesterol and nutrients.;Long Term Goal: Adherence to prescribed nutrition plan.             Nutrition Assessments:  Nutrition Assessments - 05/02/22 1217       Rate Your Plate Scores   Post Score 45            MEDIFICTS Score Key: ?70 Need to make dietary changes  40-70 Heart Healthy Diet ? 40 Therapeutic Level Cholesterol Diet  Flowsheet Row PULMONARY REHAB CHRONIC OBSTRUCTIVE PULMONARY DISEASE from 05/02/2022 in [place] Picture Your Plate Total Score on Discharge 45      Picture Your Plate  Scores: <<17Unhealthy dietary pattern with much room for improvement. 41-50 Dietary pattern unlikely to meet recommendations for good health and room for improvement. 51-60 More healthful dietary pattern, with some room for improvement.  >60 Healthy dietary pattern, although there may be some specific behaviors that could be improved.    Nutrition Goals Re-Evaluation:  Nutrition Goals Re-Evaluation     RBangorName 03/26/22 1153 04/16/22 1152 04/18/22 1056 05/14/22 1228       Goals   Current Weight 173 lb 8 oz (78.7 kg) 183 lb 6.8 oz (83.2 kg) 185 lb 6.5 oz (84.1 kg) 175 lb 14.8 oz (79.8 kg)    Comment -- No new labs at this time. -- Cr 1.52, GFR 45, Lipid panel WNL except HDL 28, A1c WNL. Patient weight is down ~2# since starting with our program.    Expected Outcome Patient with medical history of protein calorie malnutrition. Patient recently hospitalized with sepsis for acute renal failure. Palliative care and PCP are both monitoring weight loss. Weight on 03/07/2021 was 231#. He is eating three meals per day and using Premier, Boost, or Ensure 1x per day; encouraged patient to increase to two nutrition supplements daily. He lives at home  with is his wife whoe does the majority of the grocery shopping and cooking. Goals in progress. Patient continues up to 2 supplements (Boost, Premier or Ensure) per day + 3 regular meals to aid with protein calorie malnutrition. Patient weight is up 9.9# since starting with our program. He does have congestive heart failure and hypertension. Goals in progress. Patient continues up to 2 supplements (Premier, Ensure) per day + 3 regular meals to aid with protein calorie malnutrition. Nursing staff reports concerns of ankle edema today. Patient is up 2# since Tuesday 8/28; reviewed daily sodium recommendations and reading food labels for sodium. Nursing staff and patient will discuss weight trends/changes with doctor. Goals in progress. Pleas reports good appetite and  eating on a regular schedule. However, he is down ~2# since starting with our program. He reports good understanding of sodium intake, low sodium foods, and weight monitoring for congestive heart failure. His wife remains a good support for dietary intake and disease management. He typically drinks ~1 nutrition supplement daily.             Nutrition Goals Discharge (Final Nutrition Goals Re-Evaluation):  Nutrition Goals Re-Evaluation - 05/14/22 1228       Goals   Current Weight 175 lb 14.8 oz (79.8 kg)    Comment Cr 1.52, GFR 45, Lipid panel WNL except HDL 28, A1c WNL. Patient weight is down ~2# since starting with our program.    Expected Outcome Goals in progress. Ioane reports good appetite and eating on a regular schedule. However, he is down ~2# since starting with our program. He reports good understanding of sodium intake, low sodium foods, and weight monitoring for congestive heart failure. His wife remains a good support for dietary intake and disease management. He typically drinks ~1 nutrition supplement daily.             Psychosocial: Target Goals: Acknowledge presence or absence of significant depression and/or stress, maximize coping skills, provide positive support system. Participant is able to verbalize types and ability to use techniques and skills needed for reducing stress and depression.  Initial Review & Psychosocial Screening:  Initial Psych Review & Screening - 03/13/22 1237       Initial Review   Current issues with None Identified      Family Dynamics   Good Support System? Yes   Wife, son, daughter, grandchildren annd 1 great grandchild.     Barriers   Psychosocial barriers to participate in program There are no identifiable barriers or psychosocial needs.      Screening Interventions   Interventions Encouraged to exercise    Expected Outcomes Short Term goal: Identification and review with participant of any Quality of Life or Depression  concerns found by scoring the questionnaire.;Short Term goal: Utilizing psychosocial counselor, staff and physician to assist with identification of specific Stressors or current issues interfering with healing process. Setting desired goal for each stressor or current issue identified.             Quality of Life Scores:  Scores of 19 and below usually indicate a poorer quality of life in these areas.  A difference of  2-3 points is a clinically meaningful difference.  A difference of 2-3 points in the total score of the Quality of Life Index has been associated with significant improvement in overall quality of life, self-image, physical symptoms, and general health in studies assessing change in quality of life.  PHQ-9: Review Flowsheet       03/13/2022  05/09/2021 03/03/2018 11/03/2017  Depression screen PHQ 2/9  Decreased Interest 0 0 0 0  Down, Depressed, Hopeless 0 0 0 0  PHQ - 2 Score 0 0 0 0  Altered sleeping 1 - 0 -  Tired, decreased energy 1 - 1 -  Change in appetite 0 - 0 -  Feeling bad or failure about yourself  0 - - -  Trouble concentrating 0 - 0 -  Moving slowly or fidgety/restless 0 - 0 -  Suicidal thoughts 0 - 0 -  PHQ-9 Score 2 - 1 -  Difficult doing work/chores Not difficult at all - Not difficult at all -   Interpretation of Total Score  Total Score Depression Severity:  1-4 = Minimal depression, 5-9 = Mild depression, 10-14 = Moderate depression, 15-19 = Moderately severe depression, 20-27 = Severe depression   Psychosocial Evaluation and Intervention:  Psychosocial Evaluation - 03/13/22 1240       Psychosocial Evaluation & Interventions   Interventions Encouraged to exercise with the program and follow exercise prescription    Expected Outcomes For Markail to attend the PR program without any psychosocial issues or concerns.    Continue Psychosocial Services  Follow up required by staff             Psychosocial Re-Evaluation:  Psychosocial  Re-Evaluation     [place] Name 03/20/22 1649 04/16/22 0913 05/14/22 1448         Psychosocial Re-Evaluation   Current issues with None Identified None Identified None Identified     Comments Edwen was scheduled to start the PR program 03/19/22. He was admitted to the hospital 03/16/22 and is currently still in the hospial. We will continue to monitor his condition and suitation No concerns identified at this time, Santez is engaged, positive attitude, attire is very clean and coordinated.  His wife is very supportive and drives himself to pulmonary rehab twice a week.  He has a stable home situation and is steadily gaining weight which is wanted, he had lost a significant amount of weight in the last year.  No psychosocial concerns or barriers are present at this time. No concerns identified at this time, Jock is still very engaged and postive in Pulmonary Rehab. He enjoys class as he does communicate with other patients. Edmon' weight has been maintaining. His weight gain has been due to fluid. Pt is still trying to eat a low sodium diet. Aaric' wife is still very supportive. No psychosocial concerns or barriers are present at this time.     Expected Outcomes -- For Adet[place]bo to continue to be without signs of depression, barriers or psychosocial concerns while participating in pulmonary rehab. For Baraka to continue to be without signs of depression, barriers or psychosocial concerns while participating in pulmonary rehab.     Interventions Encouraged to attend Pulmonary Rehabilitation for the exercise Encouraged to attend Pulmonary Rehabilitation for the exercise Encouraged to attend Pulmonary Rehabilitation for the exercise     Continue Psychosocial Services  Follow up required by staff No Follow up required No Follow up required              Psychosocial Discharge (Final Psychosocial Re-Evaluation):  Psychosocial Re-Evaluation - 05/14/22 0858       Psychosocial Re-Evaluation   Current  issues with None Identified    Comments No concerns identified at this time, Atlee is still very engaged and postive in Pulmonary Rehab. He enjoys class as he does communicate with other patients. Cliffard' weight has  been maintaining. His weight gain has been due to fluid. Pt is still trying to eat a low sodium diet. Arian' wife is still very supportive. No psychosocial concerns or barriers are present at this time.    Expected Outcomes For Jujuan to continue to be without signs of depression, barriers or psychosocial concerns while participating in pulmonary rehab.    Interventions Encouraged to attend Pulmonary Rehabilitation for the exercise    Continue Psychosocial Services  No Follow up required             Education: Education Goals: Education classes will be provided on a weekly basis, covering required topics. Participant will state understanding/return demonstration of topics presented.  Learning Barriers/Preferences:   Education Topics: Introduction to Pulmonary Rehab Group instruction provided by PowerPoint, verbal discussion, and written material to support subject matter. Instructor reviews what Pulmonary Rehab is, the purpose of the program, and how patients are referred.     Know Your Numbers Group instruction that is supported by a PowerPoint presentation. Instructor discusses importance of knowing and understanding resting, exercise, and post-exercise oxygen saturation, heart rate, and blood pressure. Oxygen saturation, heart rate, blood pressure, rating of perceived exertion, and dyspnea are reviewed along with a normal range for these values.    Exercise for the Pulmonary Patient Group instruction that is supported by a PowerPoint presentation. Instructor discusses benefits of exercise, core components of exercise, frequency, duration, and intensity of an exercise routine, importance of utilizing pulse oximetry during exercise, safety while exercising, and options  of places to exercise outside of rehab.  Flowsheet Row PULMONARY REHAB OTHER RESPIRATORY from 02/26/2018 in [place]  Date 12/25/17  Educator EP  Instruction Review Code 1- Verbalizes Understanding          MET Level  Group instruction provided by PowerPoint, verbal discussion, and written material to support subject matter. Instructor reviews what METs are and how to increase METs.    Pulmonary Medications Verbally interactive group education provided by instructor with focus on inhaled medications and proper administration. Flowsheet Row PULMONARY REHAB CHRONIC OBSTRUCTIVE PULMONARY DISEASE from 05/02/2022 in [place]  Date 04/11/22  Educator Donnetta Simpers  [Handout]  Instruction Review Code 1- Verbalizes Understanding       Anatomy and Physiology of the Respiratory System Group instruction provided by PowerPoint, verbal discussion, and written material to support subject matter. Instructor reviews respiratory cycle and anatomical components of the respiratory system and their functions. Instructor also reviews differences in obstructive and restrictive respiratory diseases with examples of each.  Flowsheet Row PULMONARY REHAB CHRONIC OBSTRUCTIVE PULMONARY DISEASE from 05/02/2022 in [place]  Date 04/18/22  Educator Donnetta Simpers  Instruction Review Code 2- Demonstrated Understanding       Oxygen Safety Group instruction provided by PowerPoint, verbal discussion, and written material to support subject matter. There is an overview of "What is Oxygen" and "Why do we need it".  Instructor also reviews how to create a safe environment for oxygen use, the importance of using oxygen as prescribed, and the risks of noncompliance. There is a brief discussion on traveling with oxygen and resources the patient may utilize. Flowsheet Row PULMONARY REHAB CHRONIC OBSTRUCTIVE PULMONARY DISEASE from  05/02/2022 in [place]  Date 04/25/22  Educator Donnetta Simpers  Instruction Review Code 1- Verbalizes Understanding       Oxygen Use Group instruction provided by PowerPoint, verbal discussion, and written material to discuss how  supplemental oxygen is prescribed and different types of oxygen supply systems. Resources for more information are provided.  Flowsheet Row PULMONARY REHAB CHRONIC OBSTRUCTIVE PULMONARY DISEASE from 05/02/2022 in [place]  Date 05/02/22  Educator Donnetta Simpers  Instruction Review Code 1- Verbalizes Understanding       Breathing Techniques Group instruction that is supported by demonstration and [employer]. Instructor discusses the benefits of pursed lip and diaphragmatic breathing and detailed demonstration on how to perform both.  Flowsheet Row PULMONARY REHAB CHRONIC OBSTRUCTIVE PULMONARY DISEASE from 05/09/2022 in [place]  Date 05/09/22  Educator Donnetta Simpers  Instruction Review Code 1- Verbalizes Understanding        Risk Factor Reduction Group instruction that is supported by a PowerPoint presentation. Instructor discusses the definition of a risk factor, different risk factors for pulmonary disease, and how the heart and lungs work together.   MD Day A group question and answer session with a medical doctor that allows participants to ask questions that relate to their pulmonary disease state. Flowsheet Row PULMONARY REHAB OTHER RESPIRATORY from 02/26/2018 in [place]  Date 01/22/18  Educator Dr. Nelda Marseille  Instruction Review Code 1- Verbalizes Understanding       Nutrition for the Pulmonary Patient Group instruction provided by PowerPoint slides, verbal discussion, and written materials to support subject matter. The instructor gives an explanation and review of healthy diet recommendations, which includes a discussion on  weight management, recommendations for fruit and vegetable consumption, as well as protein, fluid, caffeine, fiber, sodium, sugar, and alcohol. Tips for eating when patients are short of breath are discussed. Flowsheet Row PULMONARY REHAB CHRONIC OBSTRUCTIVE PULMONARY DISEASE from 05/02/2022 in [place]  Date 04/04/22  Educator RD  Instruction Review Code 1- Verbalizes Understanding        Other Education Group or individual verbal, written, or video instructions that support the educational goals of the pulmonary rehab program. Flowsheet Row PULMONARY REHAB CHRONIC OBSTRUCTIVE PULMONARY DISEASE from 05/02/2022 in [place]  Date 03/28/22  Educator Donnetta Simpers  Instruction Review Code 1- Verbalizes Understanding        Knowledge Questionnaire Score:  Knowledge Questionnaire Score - 05/04/22 0945       Knowledge Questionnaire Score   Post Score 18/18             Core Components/Risk Factors/Patient Goals at Admission:  Personal Goals and Risk Factors at Admission - 03/13/22 1244       Core Components/Risk Factors/Patient Goals on Admission    Weight Management Weight Loss   Since 04/2019 when pt was hospitalized he has lost 62 pounds. He is concerned about this but they have not determined any medical reasons for this.He states the last month he has lost 6 pounds.   Improve shortness of breath with ADL's Yes    Intervention Provide education, individualized exercise plan and daily activity instruction to help decrease symptoms of SOB with activities of daily living.    Expected Outcomes Short Term: Improve cardiorespiratory fitness to achieve a reduction of symptoms when performing ADLs;Long Term: Be able to perform more ADLs without symptoms or delay the onset of symptoms    Increase knowledge of respiratory medications and ability to use respiratory devices properly  Yes    Intervention Provide education and  demonstration as needed of appropriate use of medications, inhalers, and oxygen therapy.    Expected Outcomes Short Term: Achieves  understanding of medications use. Understands that oxygen is a medication prescribed by physician. Demonstrates appropriate use of inhaler and oxygen therapy.;Long Term: Maintain appropriate use of medications, inhalers, and oxygen therapy.             Core Components/Risk Factors/Patient Goals Review:   Goals and Risk Factor Review     Row Name 03/20/22 1718 04/16/22 0918 05/14/22 0904         Core Components/Risk Factors/Patient Goals Review   Personal Goals Review Improve shortness of breath with ADL's;Develop more efficient breathing techniques such as purse lipped breathing and diaphragmatic breathing and practicing self-pacing with activity.;Increase knowledge of respiratory medications and ability to use respiratory devices properly.;Other Weight Management/Obesity;Increase knowledge of respiratory medications and ability to use respiratory devices properly.;Improve shortness of breath with ADL's;Develop more efficient breathing techniques such as purse lipped breathing and diaphragmatic breathing and practicing self-pacing with activity. Weight Management/Obesity;Increase knowledge of respiratory medications and ability to use respiratory devices properly.;Improve shortness of breath with ADL's;Develop more efficient breathing techniques such as purse lipped breathing and diaphragmatic breathing and practicing self-pacing with activity.     Review Pt has not started the progam yet due to being hosptialized currentlly. Will continue to mointer weight loss. Jerric has gained 3 kg since starting pulmonary rehab, which is needed.  He lost a significant amount of weight in the last year d/t illness.  He has increased his stamina and strength and is now walking the track and exercising on the nustep.  He is enjoying the program and excited to increase his stamina.  Riker has been maintaining weight in pulmonary rehab. His functionial capacity has improved on the track and exercising on the Nustep.  Burnette is about to graduate as he feels a difference in his conditioning level.     Expected Outcomes See admission goals. See admission goals. See admission goals.              Core Components/Risk Factors/Patient Goals at Discharge (Final Review):   Goals and Risk Factor Review - 05/14/22 0904       Core Components/Risk Factors/Patient Goals Review   Personal Goals Review Weight Management/Obesity;Increase knowledge of respiratory medications and ability to use respiratory devices properly.;Improve shortness of breath with ADL's;Develop more efficient breathing techniques such as purse lipped breathing and diaphragmatic breathing and practicing self-pacing with activity.    Review Kastiel has been maintaining weight in pulmonary rehab. His functionial capacity has improved on the track and exercising on the Nustep.  Quantel is about to graduate as he feels a difference in his conditioning level.    Expected Outcomes See admission goals.             ITP Comments:   Comments: Pt is making expected progress toward Pulmonary Rehab goals after completing 15 sessions. Recommend continued exercise, life style modification, education, and utilization of breathing techniques to increase stamina and strength, while also decreasing shortness of breath with exertion.  Dr. Rodman Pickle is Medical Director for Pulmonary Rehab at [facility].

## 2022-05-16 ENCOUNTER — Encounter (HOSPITAL_COMMUNITY)
Admission: RE | Admit: 2022-05-16 | Discharge: 2022-05-16 | Disposition: A | Discharge status: Home / Self Care | Payer: Medicare Other | Source: Ambulatory Visit | Attending: Pulmonary Disease | Admitting: Pulmonary Disease

## 2022-05-16 DIAGNOSIS — Z5189 Encounter for other specified aftercare: Secondary | ICD-10-CM | POA: Diagnosis not present

## 2022-05-16 DIAGNOSIS — J449 Chronic obstructive pulmonary disease, unspecified: Secondary | ICD-10-CM | POA: Diagnosis not present

## 2022-05-16 NOTE — Progress Notes (Signed)
Daily Session Note  Patient Details  Name: Joshua [name] MRN: [id number] Date of Birth: 1936/04/19 Referring Provider:   April Manson Pulmonary Rehab Walk Test from 03/13/2022 in [place]  Referring Provider Elsworth Soho       Encounter Date: 05/16/2022  Check In:  Session Check In - 05/16/22 1132       Check-In   Supervising physician immediately available to respond to emergencies [facility] - Physician supervision    Physician(s) Carlis Abbott    Location [facility]    Staff Present Elmon Else, MS, ACSM-CEP, Exercise Physiologist;Samantha [place], RD, LDN;Lisa Ysidro Evert, RN;Carlette Carlton, RN, BSN;Randi Reeve BS, ACSM-CEP, Exercise Physiologist    Virtual Visit No    Medication changes reported     No    Fall or balance concerns reported    No    Tobacco Cessation No Change    Warm-up and Cool-down Performed as group-led instruction    Resistance Training Performed Yes    VAD Patient? No    PAD/SET Patient? No      Pain Assessment   Currently in Pain? No/denies             Capillary Blood Glucose: No results found for this or any previous visit (from the past 24 hour(s)).    Social History   Tobacco Use  Smoking Status Former   Packs/day: 1.00   Years: 50.00   Total pack years: 50.00   Types: Cigarettes   Start date: 74   Quit date: 08/19/2006   Years since quitting: 15.7  Smokeless Tobacco Never  Tobacco Comments   Former smoke 03/15/22    Goals Met:  Proper associated with RPD/PD & O2 Sat Exercise tolerated well No report of concerns or symptoms today Strength training completed today  Goals Unmet:  Not Applicable  Comments: Service time is from [place] to 1150.    Dr. Rodman Pickle is Medical Director for Pulmonary Rehab at [facility].

## 2022-05-20 ENCOUNTER — Ambulatory Visit (INDEPENDENT_AMBULATORY_CARE_PROVIDER_SITE_OTHER): Payer: Medicare Other | Admitting: Internal Medicine

## 2022-05-20 ENCOUNTER — Encounter (HOSPITAL_COMMUNITY): Payer: Self-pay | Admitting: Internal Medicine

## 2022-05-20 ENCOUNTER — Emergency Department (HOSPITAL_COMMUNITY): Payer: Medicare Other

## 2022-05-20 ENCOUNTER — Other Ambulatory Visit: Payer: Self-pay

## 2022-05-20 ENCOUNTER — Inpatient Hospital Stay (HOSPITAL_COMMUNITY)
Admission: EM | Admit: 2022-05-20 | Discharge: 2022-05-22 | DRG: 178 | Disposition: A | Discharge status: Home / Self Care | Payer: Medicare Other | Source: Ambulatory Visit | Attending: Internal Medicine | Admitting: Internal Medicine

## 2022-05-20 ENCOUNTER — Encounter: Payer: Self-pay | Admitting: Internal Medicine

## 2022-05-20 VITALS — BP 126/56 | HR 125 | Temp 103.2°F | Ht 75.0 in | Wt 180.0 lb

## 2022-05-20 DIAGNOSIS — J9611 Chronic respiratory failure with hypoxia: Secondary | ICD-10-CM | POA: Diagnosis present

## 2022-05-20 DIAGNOSIS — E869 Volume depletion, unspecified: Secondary | ICD-10-CM | POA: Diagnosis present

## 2022-05-20 DIAGNOSIS — Z79899 Other long term (current) drug therapy: Secondary | ICD-10-CM | POA: Diagnosis not present

## 2022-05-20 DIAGNOSIS — U071 COVID-19: Secondary | ICD-10-CM

## 2022-05-20 DIAGNOSIS — Z7901 Long term (current) use of anticoagulants: Secondary | ICD-10-CM

## 2022-05-20 DIAGNOSIS — J439 Emphysema, unspecified: Secondary | ICD-10-CM | POA: Diagnosis not present

## 2022-05-20 DIAGNOSIS — I50810 Right heart failure, unspecified: Secondary | ICD-10-CM

## 2022-05-20 DIAGNOSIS — N401 Enlarged prostate with lower urinary tract symptoms: Secondary | ICD-10-CM | POA: Diagnosis present

## 2022-05-20 DIAGNOSIS — Z8249 Family history of ischemic heart disease and other diseases of the circulatory system: Secondary | ICD-10-CM

## 2022-05-20 DIAGNOSIS — E785 Hyperlipidemia, unspecified: Secondary | ICD-10-CM | POA: Diagnosis present

## 2022-05-20 DIAGNOSIS — M109 Gout, unspecified: Secondary | ICD-10-CM | POA: Diagnosis present

## 2022-05-20 DIAGNOSIS — I252 Old myocardial infarction: Secondary | ICD-10-CM

## 2022-05-20 DIAGNOSIS — J441 Chronic obstructive pulmonary disease with (acute) exacerbation: Secondary | ICD-10-CM | POA: Diagnosis not present

## 2022-05-20 DIAGNOSIS — I130 Hypertensive heart and chronic kidney disease with heart failure and stage 1 through stage 4 chronic kidney disease, or unspecified chronic kidney disease: Secondary | ICD-10-CM | POA: Diagnosis present

## 2022-05-20 DIAGNOSIS — I5082 Biventricular heart failure: Secondary | ICD-10-CM | POA: Diagnosis present

## 2022-05-20 DIAGNOSIS — I272 Pulmonary hypertension, unspecified: Secondary | ICD-10-CM | POA: Diagnosis present

## 2022-05-20 DIAGNOSIS — Z86711 Personal history of pulmonary embolism: Secondary | ICD-10-CM

## 2022-05-20 DIAGNOSIS — K219 Gastro-esophageal reflux disease without esophagitis: Secondary | ICD-10-CM | POA: Diagnosis not present

## 2022-05-20 DIAGNOSIS — I951 Orthostatic hypotension: Secondary | ICD-10-CM | POA: Diagnosis present

## 2022-05-20 DIAGNOSIS — I2729 Other secondary pulmonary hypertension: Secondary | ICD-10-CM | POA: Diagnosis present

## 2022-05-20 DIAGNOSIS — J432 Centrilobular emphysema: Secondary | ICD-10-CM | POA: Diagnosis not present

## 2022-05-20 DIAGNOSIS — N1832 Chronic kidney disease, stage 3b: Secondary | ICD-10-CM | POA: Diagnosis present

## 2022-05-20 DIAGNOSIS — I5032 Chronic diastolic (congestive) heart failure: Secondary | ICD-10-CM | POA: Diagnosis present

## 2022-05-20 DIAGNOSIS — Z8672 Personal history of thrombophlebitis: Secondary | ICD-10-CM | POA: Diagnosis not present

## 2022-05-20 DIAGNOSIS — Z9981 Dependence on supplemental oxygen: Secondary | ICD-10-CM

## 2022-05-20 DIAGNOSIS — I959 Hypotension, unspecified: Secondary | ICD-10-CM

## 2022-05-20 DIAGNOSIS — I10 Essential (primary) hypertension: Secondary | ICD-10-CM | POA: Diagnosis not present

## 2022-05-20 DIAGNOSIS — M7989 Other specified soft tissue disorders: Secondary | ICD-10-CM | POA: Diagnosis present

## 2022-05-20 DIAGNOSIS — Z882 Allergy status to sulfonamides status: Secondary | ICD-10-CM | POA: Diagnosis not present

## 2022-05-20 HISTORY — DX: COVID-19: U07.1

## 2022-05-20 LAB — COMPREHENSIVE METABOLIC PANEL
ALT: 24 U/L (ref 0–44)
AST: 37 U/L (ref 15–41)
Albumin: 3.6 g/dL (ref 3.5–5.0)
Alkaline Phosphatase: 90 U/L (ref 38–126)
Anion gap: 9 (ref 5–15)
BUN: 39 mg/dL — ABNORMAL HIGH (ref 8–23)
CO2: 31 mmol/L (ref 22–32)
Calcium: 9.6 mg/dL (ref 8.9–10.3)
Chloride: 96 mmol/L — ABNORMAL LOW (ref 98–111)
Creatinine, Ser: 1.92 mg/dL — ABNORMAL HIGH (ref 0.61–1.24)
GFR, Estimated: 34 mL/min — ABNORMAL LOW (ref >60)
Glucose, Bld: 132 mg/dL — ABNORMAL HIGH (ref 70–99)
Potassium: 3.7 mmol/L (ref 3.5–5.1)
Sodium: 136 mmol/L (ref 135–145)
Total Bilirubin: 0.7 mg/dL (ref 0.3–1.2)
Total Protein: 7.8 g/dL (ref 6.5–8.1)

## 2022-05-20 LAB — CBC WITH DIFFERENTIAL/PLATELET
Abs Immature Granulocytes: 0.01 10*3/uL (ref 0.00–0.07)
Basophils Absolute: 0 10*3/uL (ref 0.0–0.1)
Basophils Relative: 0 %
Eosinophils Absolute: 0 10*3/uL (ref 0.0–0.5)
Eosinophils Relative: 0 %
HCT: 34.5 % — ABNORMAL LOW (ref 39.0–52.0)
Hemoglobin: 11 g/dL — ABNORMAL LOW (ref 13.0–17.0)
Immature Granulocytes: 0 %
Lymphocytes Relative: 5 %
Lymphs Abs: 0.3 10*3/uL — ABNORMAL LOW (ref 0.7–4.0)
MCH: 27.7 pg (ref 26.0–34.0)
MCHC: 31.9 g/dL (ref 30.0–36.0)
MCV: 86.9 fL (ref 80.0–100.0)
Monocytes Absolute: 0.8 10*3/uL (ref 0.1–1.0)
Monocytes Relative: 14 %
Neutro Abs: 4.8 10*3/uL (ref 1.7–7.7)
Neutrophils Relative %: 81 %
Platelets: 176 10*3/uL (ref 150–400)
RBC: 3.97 MIL/uL — ABNORMAL LOW (ref 4.22–5.81)
RDW: 14.6 % (ref 11.5–15.5)
WBC: 6 10*3/uL (ref 4.0–10.5)
nRBC: 0 % (ref 0.0–0.2)

## 2022-05-20 LAB — POC COVID19 BINAXNOW: SARS Coronavirus 2 Ag: POSITIVE — AB

## 2022-05-20 LAB — LACTATE DEHYDROGENASE: LDH: 128 U/L (ref 98–192)

## 2022-05-20 MED ORDER — IPRATROPIUM-ALBUTEROL 0.5-2.5 (3) MG/3ML IN SOLN
3.0000 mL | Freq: Once | RESPIRATORY_TRACT | Status: AC
  Administered 2022-05-20: 3 mL via RESPIRATORY_TRACT
  Filled 2022-05-20: qty 3

## 2022-05-20 MED ORDER — IPRATROPIUM-ALBUTEROL 0.5-2.5 (3) MG/3ML IN SOLN
3.0000 mL | Freq: Four times a day (QID) | RESPIRATORY_TRACT | Status: DC
  Administered 2022-05-20: 3 mL via RESPIRATORY_TRACT
  Filled 2022-05-20: qty 3

## 2022-05-20 MED ORDER — IPRATROPIUM-ALBUTEROL 0.5-2.5 (3) MG/3ML IN SOLN
3.0000 mL | Freq: Three times a day (TID) | RESPIRATORY_TRACT | Status: DC
  Administered 2022-05-21: 3 mL via RESPIRATORY_TRACT
  Filled 2022-05-20: qty 3

## 2022-05-20 MED ORDER — ACETAMINOPHEN 650 MG RE SUPP: 650.0000 mg | Freq: Four times a day (QID) | RECTAL | Status: DC | PRN

## 2022-05-20 MED ORDER — SODIUM CHLORIDE 0.9 % IV BOLUS
500.0000 mL | Freq: Once | INTRAVENOUS | Status: AC
  Administered 2022-05-20: 500 mL via INTRAVENOUS

## 2022-05-20 MED ORDER — APIXABAN 5 MG PO TABS
5.0000 mg | ORAL_TABLET | Freq: Two times a day (BID) | ORAL | Status: DC
  Administered 2022-05-21 – 2022-05-22 (×4): 5 mg via ORAL
  Filled 2022-05-20 (×4): qty 1

## 2022-05-20 MED ORDER — ATORVASTATIN CALCIUM 40 MG PO TABS
40.0000 mg | ORAL_TABLET | Freq: Every day | ORAL | Status: DC
  Administered 2022-05-21 – 2022-05-22 (×2): 40 mg via ORAL
  Filled 2022-05-20 (×2): qty 1

## 2022-05-20 MED ORDER — MOLNUPIRAVIR EUA 200MG CAPSULE
4.0000 | ORAL_CAPSULE | Freq: Two times a day (BID) | ORAL | Status: DC
  Administered 2022-05-21 – 2022-05-22 (×3): 800 mg via ORAL
  Filled 2022-05-20: qty 4

## 2022-05-20 MED ORDER — RIOCIGUAT 2 MG PO TABS
2.0000 mg | ORAL_TABLET | Freq: Three times a day (TID) | ORAL | Status: DC
  Administered 2022-05-21 – 2022-05-22 (×4): 2 mg via ORAL

## 2022-05-20 MED ORDER — MIDODRINE HCL 5 MG PO TABS
15.0000 mg | ORAL_TABLET | Freq: Three times a day (TID) | ORAL | Status: DC
  Administered 2022-05-21 – 2022-05-22 (×4): 15 mg via ORAL
  Filled 2022-05-20 (×4): qty 3

## 2022-05-20 MED ORDER — LACTATED RINGERS IV SOLN: INTRAVENOUS | Status: DC

## 2022-05-20 MED ORDER — METHYLPREDNISOLONE SODIUM SUCC 125 MG IJ SOLR
125.0000 mg | Freq: Once | INTRAMUSCULAR | Status: AC
  Administered 2022-05-20: 125 mg via INTRAVENOUS
  Filled 2022-05-20: qty 2

## 2022-05-20 MED ORDER — ONDANSETRON HCL 4 MG PO TABS: 4.0000 mg | ORAL_TABLET | Freq: Four times a day (QID) | ORAL | Status: DC | PRN

## 2022-05-20 MED ORDER — FINASTERIDE 5 MG PO TABS
5.0000 mg | ORAL_TABLET | Freq: Every day | ORAL | Status: DC
  Administered 2022-05-21 (×2): 5 mg via ORAL
  Filled 2022-05-20 (×3): qty 1

## 2022-05-20 MED ORDER — ACETAMINOPHEN 325 MG PO TABS
650.0000 mg | ORAL_TABLET | Freq: Four times a day (QID) | ORAL | Status: DC | PRN
  Filled 2022-05-20: qty 2

## 2022-05-20 MED ORDER — ACETAMINOPHEN 500 MG PO TABS
1000.0000 mg | ORAL_TABLET | Freq: Once | ORAL | Status: AC
  Administered 2022-05-20: 1000 mg via ORAL
  Filled 2022-05-20: qty 2

## 2022-05-20 MED ORDER — PANTOPRAZOLE SODIUM 40 MG PO TBEC
40.0000 mg | DELAYED_RELEASE_TABLET | Freq: Every morning | ORAL | Status: DC
  Administered 2022-05-21 – 2022-05-22 (×2): 40 mg via ORAL
  Filled 2022-05-20 (×2): qty 1

## 2022-05-20 MED ORDER — METHYLPREDNISOLONE SODIUM SUCC 40 MG IJ SOLR
40.0000 mg | Freq: Two times a day (BID) | INTRAMUSCULAR | Status: DC
  Administered 2022-05-21 – 2022-05-22 (×3): 40 mg via INTRAVENOUS
  Filled 2022-05-20 (×3): qty 1

## 2022-05-20 MED ORDER — ONDANSETRON HCL 4 MG/2ML IJ SOLN
4.0000 mg | Freq: Four times a day (QID) | INTRAMUSCULAR | Status: DC | PRN
  Administered 2022-05-21: 4 mg via INTRAVENOUS
  Filled 2022-05-20: qty 2

## 2022-05-20 NOTE — Assessment & Plan Note (Signed)
Continue with supplemental O2

## 2022-05-20 NOTE — Assessment & Plan Note (Signed)
Stable. Continue with eliquis 5 mg bid.

## 2022-05-20 NOTE — Assessment & Plan Note (Addendum)
Stable. Baseline SCr 1.5-1.8

## 2022-05-20 NOTE — Assessment & Plan Note (Signed)
Continue riociguat 2 mg tid.

## 2022-05-20 NOTE — Assessment & Plan Note (Signed)
Appears intravascularly dry. Continue with gentle IVF. Last echo 65% in 02-2022

## 2022-05-20 NOTE — Assessment & Plan Note (Signed)
Stable. Continue lipitor 40 mg daily.

## 2022-05-20 NOTE — ED Triage Notes (Signed)
Pt sent by pulmonologist for covid + with fever.  Cough and weakness x2 days Temp 103 at pulmonologist pt reports he believes they gave him meds.  2lpm [place] baseline at home.  Hx copd and CHF.

## 2022-05-20 NOTE — Assessment & Plan Note (Signed)
Admit to SDU bed due to hypotension. Continue with IV solumedrol 40 mg q12h. Start molupriavir. Continue with duonebs. Pt is not wheezing this point. No abx indicated at this point.

## 2022-05-20 NOTE — Assessment & Plan Note (Signed)
Exacerbated by covid-19. Continue with solumedrol and duonebs.

## 2022-05-20 NOTE — Patient Instructions (Addendum)
Go directly to [facility] ER  - we have notified the ER

## 2022-05-20 NOTE — H&P (Signed)
History and Physical    Joshua [name] RWE:[id number] DOB: [birth year] DOA: 05/20/2022  DOS: the patient was seen and examined on 05/20/2022  PCP: [name], DO   Patient coming from: Home  I have personally briefly reviewed patient's old medical records in [place]  CC: SOB, +covid-57 HPI: 86 year old African-American male history of COPD, chronic hypoxic respiratory failure on home O2 at 2 L minute, chronic diastolic heart failure, chronic right-sided heart failure with reduced RV function, pulmonary hypertension,, orthostatic hypotension on midodrine, hyperlipidemia, reflux, hiatal hernia, history of PE on chronic anticoagulation, CKD stage IIIb baseline creatinine 1.5-1.8 presents the ER today from the pulmonology office.  Patient's been sick since Sunday.  He has had increasing shortness of breath.  He states that his wife has [place].  Patient denies any fevers, diarrhea, vomiting.  He has had increasing shortness of breath.  Was seen in the office today.  Had a positive COVID test.  Was noted to be wheezing the office.  Patient sent to the ER for evaluation.  On arrival, low-grade temp of 100.8, heart rate 119, blood pressure 113/58.  Room air saturations were 80%.  Patient noted to be progressively more hypotensive with blood pressures dropping into 90/55.  He was given IV fluid bolus.  Chest x-ray was negative for pneumonia.  He had some left subsegmental atelectasis.  Due to persistent hypotension and covid-19 infection. [facility] contacted for admission.   ED Course: noted to be hypoxic on RA to 80%. Low grade fever of 100.8. BP 90/55. Cxr negative for pneumonia  Review of Systems:  Review of Systems  Constitutional:  Positive for fever and malaise/fatigue.  HENT: Negative.    Eyes: Negative.   Respiratory:  Positive for shortness of breath and wheezing.   Cardiovascular: Negative.   Gastrointestinal:  Negative for diarrhea, nausea and vomiting.       +anorexia   Genitourinary: Negative.   Musculoskeletal:  Positive for myalgias.  Skin: Negative.   Neurological:  Positive for weakness.  Endo/Heme/Allergies: Negative.   Psychiatric/Behavioral: Negative.    All other systems reviewed and are negative.   Past Medical History:  Diagnosis Date   Arthritis    BPH (benign prostatic hyperplasia)    Chronic hiccups    COPD (chronic obstructive pulmonary disease) (HCC)    Folliculitis    posterior scalp per office visit note of Dr Maudie Mercury 07/20/2014     GERD (gastroesophageal reflux disease)    Gout    Hypertension    Hypotension    NSTEMI (non-ST elevated myocardial infarction) ([place]) 03/19/2022   PE (pulmonary thromboembolism) ([place])    Phlebitis    right arm  at least 20 years ago    Pneumonia    hx of pneumonia as a child    Pulmonary embolism ([place]) 03/17/2022   Pulmonary hypertension ([place])    Severe sepsis ([place]) 03/16/2022   Sleep apnea     Past Surgical History:  Procedure Laterality Date   BIOPSY  11/03/2020   Procedure: BIOPSY;  Surgeon: Carol Ada, MD;  Location: [facility];  Service: Endoscopy;;   BIOPSY  07/06/2021   Procedure: BIOPSY;  Surgeon: Carol Ada, MD;  Location: [facility];  Service: Endoscopy;;   bone removed from little toe right foot      CHOLECYSTECTOMY     ESOPHAGOGASTRODUODENOSCOPY (EGD) WITH PROPOFOL N/A 11/03/2020   Procedure: ESOPHAGOGASTRODUODENOSCOPY (EGD) WITH PROPOFOL;  Surgeon: Carol Ada, MD;  Location: [facility];  Service: Endoscopy;  Laterality: N/A;  ESOPHAGOGASTRODUODENOSCOPY (EGD) WITH PROPOFOL N/A 07/06/2021   Procedure: ESOPHAGOGASTRODUODENOSCOPY (EGD) WITH PROPOFOL;  Surgeon: Carol Ada, MD;  Location: [facility];  Service: Endoscopy;  Laterality: N/A;   pilonidal cyst removal      RIGHT HEART CATH N/A 12/13/2020   Procedure: RIGHT HEART CATH;  Surgeon: Larey Dresser, MD;  Location: [place];  Service: Cardiovascular;  Laterality: N/A;   RIGHT/LEFT HEART CATH AND CORONARY  ANGIOGRAPHY N/A 03/21/2022   Procedure: RIGHT/LEFT HEART CATH AND CORONARY ANGIOGRAPHY;  Surgeon: Larey Dresser, MD;  Location: [place];  Service: Cardiovascular;  Laterality: N/A;   TOTAL KNEE ARTHROPLASTY Left 09/13/2014   Procedure: LEFT TOTAL KNEE ARTHROPLASTY;  Surgeon: Mauri Pole, MD;  Location: [facility];  Service: Orthopedics;  Laterality: Left;     reports that he quit smoking about 15 years ago. His smoking use included cigarettes. He started smoking about 67 years ago. He has a 50.00 pack-year smoking history. He has never used smokeless tobacco. He reports that he does not currently use alcohol after a past usage of about 6.0 standard drinks of alcohol per week. He reports that he does not use drugs.  Allergies  Allergen Reactions   Other Swelling    Beer- Swelling    Sunflower Oil Swelling   Sulfa Antibiotics Rash    Family History  Problem Relation Age of Onset   Heart attack Brother 79    Prior to Admission medications   Medication Sig Start Date End Date Taking? Authorizing Provider  albuterol (VENTOLIN HFA) 108 (90 Base) MCG/ACT inhaler Inhale 1 puff into the lungs every 4 (four) hours as needed for shortness of breath.    [provider]  allopurinol (ZYLOPRIM) 100 MG tablet Take 100 mg by mouth every morning.    [provider]  atorvastatin (LIPITOR) 40 MG tablet Take 1 tablet (40 mg total) by mouth daily. 04/26/22   [place], Maricela Bo, FNP  Azelastine HCl 0.15 % SOLN Place 2 sprays into the nose daily as needed (allergies). 02/22/21   [provider]  Cinnamon 500 MG capsule Take 500 mg by mouth every morning.    [provider]  ELIQUIS 5 MG TABS tablet TAKE 1 TABLET BY MOUTH TWICE A DAY 02/18/22   Larey Dresser, MD  finasteride (PROSCAR) 5 MG tablet Take 5 mg by mouth at bedtime.    [provider]  gabapentin (NEURONTIN) 100 MG capsule Take 300 mg by mouth 3 (three) times daily. 11/27/20   [provider]  magnesium oxide (MAG-OX) 400 MG tablet Take 1 tablet (400 mg total) by mouth daily. 02/18/22   Larey Dresser, MD  metoCLOPramide (REGLAN) 5 MG tablet Take 5 mg by mouth 3 (three) times daily before meals.    [provider]  midodrine (PROAMATINE) 10 MG tablet Take 1.5 tablets (15 mg total) by mouth 3 (three) times daily with meals. 10/17/21   Larey Dresser, MD  Multiple Vitamin (MULTIVITAMIN WITH MINERALS) TABS tablet Take 1 tablet by mouth daily. 05/01/21   Sheikh, Omair Latif, DO  ondansetron (ZOFRAN) 4 MG tablet Take 1 tablet (4 mg total) by mouth every 6 (six) hours as needed for nausea. 04/30/21   Raiford Noble Latif, DO  OXYGEN Inhale 2 L into the lungs continuous.    [provider]  pantoprazole (PROTONIX) 40 MG tablet Take 40 mg by mouth every morning.    [provider]  Riociguat 2 MG TABS Take  2 mg by mouth in the morning, at noon, and at bedtime. 10/08/21   Larey Dresser, MD  STIOLTO RESPIMAT 2.5-2.5 MCG/ACT AERS INHALE 2 PUFFS BY MOUTH INTO THE LUNGS DAILY Patient taking differently: Inhale 2 each into the lungs daily. 11/26/21   [name], MD  torsemide (DEMADEX) 20 MG tablet Take 1 tablet (20 mg total) by mouth daily. 04/26/22   Rafael Bihari, FNP    Physical Exam: Vitals:   05/20/22 1800 05/20/22 1830 05/20/22 1900 05/20/22 1930  BP: (!) 92/52 (!) 90/55 (!) 93/54 (!) 98/50  Pulse: 98 96 92 90  Resp: _0 Temp: 98.1 F (36.7 C)     TempSrc:      SpO2: 96% 96% 93% 94%    Physical Exam Vitals and nursing note reviewed.  Constitutional:      Appearance: He is ill-appearing. He is not toxic-appearing or diaphoretic.     Comments: Chronically ill appearing male Appears thin.  HENT:     Head: Normocephalic and atraumatic.     Nose: Nose normal.  Eyes:     General: No scleral icterus. Cardiovascular:     Rate and Rhythm: Normal rate and regular rhythm.     Pulses: Normal pulses.  Pulmonary:     Effort: No tachypnea or  respiratory distress.     Breath sounds: Examination of the right-upper field reveals decreased breath sounds. Examination of the left-upper field reveals decreased breath sounds. Examination of the right-middle field reveals decreased breath sounds. Examination of the left-middle field reveals decreased breath sounds. Examination of the right-lower field reveals decreased breath sounds. Examination of the left-lower field reveals decreased breath sounds. Decreased breath sounds present. No wheezing.  Abdominal:     General: Abdomen is flat. Bowel sounds are normal. There is no distension.     Palpations: Abdomen is soft.     Tenderness: There is no abdominal tenderness. There is no guarding or rebound.  Musculoskeletal:     Right ankle: Swelling present.     Left ankle: Swelling present.     Right foot: Swelling present.     Left foot: Swelling present.     Comments: Trace bilateral ankle edema Trace bilateral pedal edema  Skin:    General: Skin is warm and dry.     Capillary Refill: Capillary refill takes less than 2 seconds.  Neurological:     General: No focal deficit present.     Mental Status: He is alert and oriented to person, place, and time.      Labs on Admission: I have personally reviewed following labs and imaging studies  CBC: Recent Labs  Lab 05/20/22 1730  WBC 6.0  NEUTROABS 4.8  HGB 11.0*  HCT 34.5*  MCV 86.9  PLT 081   Basic Metabolic Panel: Recent Labs  Lab 05/20/22 1730  NA 136  K 3.7  CL 96*  CO2 31  GLUCOSE 132*  BUN 39*  CREATININE 1.92*  CALCIUM 9.6   GFR: Estimated Creatinine Clearance: 33.1 mL/min (A) (by C-G formula based on SCr of 1.92 mg/dL (H)). Liver Function Tests: Recent Labs  Lab 05/20/22 1730  AST 37  ALT 24  ALKPHOS 90  BILITOT 0.7  PROT 7.8  ALBUMIN 3.6   No results for input(s): "LIPASE", "AMYLASE" in the last 168 hours. No results for input(s): "AMMONIA" in the last 168 hours. Coagulation Profile: No results for  input(s): "INR", "PROTIME" in the last 168 hours. Cardiac Enzymes: No  results for input(s): "CKTOTAL", "CKMB", "CKMBINDEX", "TROPONINI", "TROPONINIHS" in the last 168 hours. BNP (last 3 results) No results for input(s): "PROBNP" in the last 8760 hours. HbA1C: No results for input(s): "HGBA1C" in the last 72 hours. CBG: No results for input(s): "GLUCAP" in the last 168 hours. Lipid Profile: No results for input(s): "CHOL", "HDL", "LDLCALC", "TRIG", "CHOLHDL", "LDLDIRECT" in the last 72 hours. Thyroid Function Tests: No results for input(s): "TSH", "T4TOTAL", "FREET4", "T3FREE", "THYROIDAB" in the last 72 hours. Anemia Panel: No results for input(s): "VITAMINB12", "FOLATE", "FERRITIN", "TIBC", "IRON", "RETICCTPCT" in the last 72 hours. Urine analysis:    Component Value Date/Time   COLORURINE YELLOW 03/16/2022 1530   APPEARANCEUR HAZY (A) 03/16/2022 1530   LABSPEC 1.013 03/16/2022 1530   PHURINE 5.0 03/16/2022 1530   GLUCOSEU NEGATIVE 03/16/2022 1530   HGBUR LARGE (A) 03/16/2022 1530   BILIRUBINUR NEGATIVE 03/16/2022 1530   KETONESUR NEGATIVE 03/16/2022 1530   PROTEINUR 30 (A) 03/16/2022 1530   UROBILINOGEN 0.2 09/08/2014 0927   NITRITE NEGATIVE 03/16/2022 1530   LEUKOCYTESUR NEGATIVE 03/16/2022 1530    Radiological Exams on Admission: I have personally reviewed images DG Chest Portable 1 View  Result Date: 05/20/2022 CLINICAL DATA:  Shortness of breath, fever. EXAM: PORTABLE CHEST 1 VIEW COMPARISON:  March 06, 2022. FINDINGS: Stable cardiomegaly. Minimal left basilar atelectasis is noted. Right lung is unremarkable. Bony thorax is unremarkable. IMPRESSION: Minimal left basilar subsegmental atelectasis. Electronically Signed   By: Marijo Conception M.D.   On: 05/20/2022 17:56    EKG: My personal interpretation of EKG shows: sinus tachycardia    Assessment/Plan Principal Problem:   COVID-19 virus infection Active Problems:   Hypotension   Personal history of pulmonary  embolism   Chronic respiratory failure with hypoxia (HCC) - on home O2 @ 2 L/min   Hypertension   Benign prostatic hyperplasia with lower urinary tract symptoms   Gastroesophageal reflux disease without esophagitis   COPD with emphysema (HCC)   Dyslipidemia   Chronic diastolic CHF (congestive heart failure) (HCC)   Stage 3b chronic kidney disease (CKD) (HCC) - baseline SCr 1.5-1.8   Pulmonary hypertension ([place])   Right heart failure with reduced right ventricular function ([place])    Assessment and Plan: * COVID-19 virus infection Admit to SDU bed due to hypotension. Continue with IV solumedrol 40 mg q12h. Start molupriavir. Continue with duonebs. Pt is not wheezing this point. No abx indicated at this point.  Hypotension Likely due to volume depletion. Given IVF in ER. Still hypotensive. Start maintenance IVF. Hold diuretics. Appears to also have orthostatic hypotension due to RV failure. Is on midodrine at home.  Stage 3b chronic kidney disease (CKD) (HCC) - baseline SCr 1.5-1.8 Stable. Baseline SCr 5.5-7.3  Chronic diastolic CHF (congestive heart failure) (HCC) Appears intravascularly dry. Continue with gentle IVF. Last echo 65% in 02-2022  Dyslipidemia Stable. Continue lipitor 40 mg daily.  COPD with emphysema ([place]) Exacerbated by covid-19. Continue with solumedrol and duonebs.  Gastroesophageal reflux disease without esophagitis Continue with protonix 40 mg daily.  Benign prostatic hyperplasia with lower urinary tract symptoms Stable. Continue with proscar.  Hypertension Hold HTN meds.  Chronic respiratory failure with hypoxia (HCC) - on home O2 @ 2 L/min Continue with supplemental O2  Personal history of pulmonary embolism Stable. Continue with eliquis 5 mg bid.  Right heart failure with reduced right ventricular function (HCC) Continue midodrine. Will need additional preload with IVF.  Pulmonary hypertension (HCC) Continue riociguat 2 mg tid.   DVT  prophylaxis: Eliquis Code Status: Full Code Family Communication: no family at bedside  Disposition Plan: return home  Consults called: EDP has consulted pulmonology  Admission status: Observation, Step Down Unit   Kristopher Oppenheim, DO [facility] 05/20/2022, 8:01 PM

## 2022-05-20 NOTE — Progress Notes (Signed)
Joshua [name], male    DOB: 10-17-1935   MRN: [id number]   Brief patient profile:  86  yobm quit smoking 2008  GOLD 2 copd/ 02 dep / maint on stiolto and 2lpm     History of Present Illness  05/20/2022  Pulmonary/ 1Acute office eval/[facility]  cough and fever/ covid exposed  Chief Complaint  Patient presents with   Acute Visit    Pt c/o hiccups, belching and cough over the past 2 days. Cough has been non prod. He has had some pain in the right upper part of his back. He also c/o increased SOB.  Dyspnea:  at rest Cough: dry/ with R post cp with cough  Ate bfast but nothing since/ no abd pain / nausea / dysuria or HA  Neg covid x 2 at home    Past Medical History:  Diagnosis Date   Arthritis    BPH (benign prostatic hyperplasia)    Chronic hiccups    COPD (chronic obstructive pulmonary disease) (HCC)    Folliculitis    posterior scalp per office visit note of Dr Maudie Mercury 07/20/2014     GERD (gastroesophageal reflux disease)    Gout    Hypertension    Hypotension    PE (pulmonary thromboembolism) ([place])    Phlebitis    right arm  at least 20 years ago    Pneumonia    hx of pneumonia as a child    Pulmonary hypertension ([place])    Sleep apnea     Outpatient Medications Prior to Visit  Medication Sig Dispense Refill   albuterol (VENTOLIN HFA) 108 (90 Base) MCG/ACT inhaler Inhale 1 puff into the lungs every 4 (four) hours as needed for shortness of breath.     allopurinol (ZYLOPRIM) 100 MG tablet Take 100 mg by mouth every morning.     atorvastatin (LIPITOR) 40 MG tablet Take 1 tablet (40 mg total) by mouth daily. 30 tablet 6   Azelastine HCl 0.15 % SOLN Place 2 sprays into the nose daily as needed (allergies).     Cinnamon 500 MG capsule Take 500 mg by mouth every morning.     ELIQUIS 5 MG TABS tablet TAKE 1 TABLET BY MOUTH TWICE A DAY 60 tablet 11   finasteride (PROSCAR) 5 MG tablet Take 5 mg by mouth at bedtime.     gabapentin (NEURONTIN) 100 MG capsule Take 300 mg by mouth 3  (three) times daily.     magnesium oxide (MAG-OX) 400 MG tablet Take 1 tablet (400 mg total) by mouth daily. 30 tablet 3   metoCLOPramide (REGLAN) 5 MG tablet Take 5 mg by mouth 3 (three) times daily before meals.     midodrine (PROAMATINE) 10 MG tablet Take 1.5 tablets (15 mg total) by mouth 3 (three) times daily with meals. 90 tablet 3   Multiple Vitamin (MULTIVITAMIN WITH MINERALS) TABS tablet Take 1 tablet by mouth daily. 30 tablet 0   ondansetron (ZOFRAN) 4 MG tablet Take 1 tablet (4 mg total) by mouth every 6 (six) hours as needed for nausea. 20 tablet 0   OXYGEN Inhale 2 L into the lungs continuous.     pantoprazole (PROTONIX) 40 MG tablet Take 40 mg by mouth every morning.     Riociguat 2 MG TABS Take 2 mg by mouth in the morning, at noon, and at bedtime. 90 tablet 11   STIOLTO RESPIMAT 2.5-2.5 MCG/ACT AERS INHALE 2 PUFFS BY MOUTH INTO THE LUNGS DAILY (Patient taking differently: Inhale  2 each into the lungs daily.) 4 g 5   torsemide (DEMADEX) 20 MG tablet Take 1 tablet (20 mg total) by mouth daily. 30 tablet 6   No facility-administered medications prior to visit.     Objective:     BP (!) 126/56 (BP Location: Left Arm, Cuff Size: Normal)   Pulse (!) 125   Temp (!) 103.2 F (39.6 C) (Oral)   Ht 6' 3" (1.905 m)   Wt 180 lb (81.6 kg)   SpO2 90% Comment: 2lpm pulsed o2  BMI 22.50 kg/m   SpO2: 90 % (2lpm pulsed o2) O2 Type: Pulse O2 O2 Flow Rate (L/min): 3 L/min  Frail appearing acutely ill bm w/c bound mild increased wob   HEENT :  mask in place   NECK :  without JVD/Nodes/TM/ nl carotid upstrokes bilaterally   LUNGS: no acc muscle use,  Mod barrel  contour chest wall with bilateral  Distant bs s audible wheeze and  without cough on insp or exp maneuvers and mod  Hyperresonant  to  percussion bilaterally     CV:  RRR  no s3 or murmur or increase in P2, and no edema   ABD:  soft and nontender with pos mid insp Hoover's  in the supine position. No bruits or  organomegaly appreciated, bowel sounds nl  MS:   Ext warm without deformities or   obvious joint restrictions , calf tenderness, cyanosis or clubbing  SKIN: warm and dry without lesions    NEURO:  alert, approp, nl sensorium with  no motor or cerebellar deficits apparent.          Covid 19 antigen POS     Assessment   Acute COVID-19 Onset of symptoms 05/18/22 / vax x 3 remotely / baseline 02 dep resp failure  GOLD 2 COPD   >>> to [facility] ER for eval / likely admit since 02 dep at rest   Discussed in detail all the  indications, usual  risks and alternatives  relative to the benefits with patient who agrees to proceed with Rx as outlined.             Each maintenance medication was reviewed in detail including emphasizing most importantly the difference between maintenance and prns and under what circumstances the prns are to be triggered using an action plan format where appropriate.  Total time for H and P, chart review, counseling, reviewing 02 device(s) and generating customized AVS unique to this office visit / same day charting  > 30 min with pt new to me           Christinia Gully, MD 05/20/2022

## 2022-05-20 NOTE — Subjective & Objective (Signed)
CC: SOB, +covid-45 HPI: 86 year old African-American male history of COPD, chronic hypoxic respiratory failure on home O2 at 2 L minute, chronic diastolic heart failure, chronic right-sided heart failure with reduced RV function, pulmonary hypertension,, orthostatic hypotension on midodrine, hyperlipidemia, reflux, hiatal hernia, history of PE on chronic anticoagulation, CKD stage IIIb baseline creatinine 1.5-1.8 presents the ER today from the pulmonology office.  Patient's been sick since Sunday.  He has had increasing shortness of breath.  He states that his wife has Edmund.  Patient denies any fevers, diarrhea, vomiting.  He has had increasing shortness of breath.  Was seen in the office today.  Had a positive COVID test.  Was noted to be wheezing the office.  Patient sent to the ER for evaluation.  On arrival, low-grade temp of 100.8, heart rate 119, blood pressure 113/58.  Room air saturations were 80%.  Patient noted to be progressively more hypotensive with blood pressures dropping into 90/55.  He was given IV fluid bolus.  Chest x-ray was negative for pneumonia.  He had some left subsegmental atelectasis.  Due to persistent hypotension and covid-19 infection. [facility] contacted for admission.

## 2022-05-20 NOTE — Assessment & Plan Note (Signed)
Continue with protonix 40 mg daily.

## 2022-05-20 NOTE — ED Provider Notes (Signed)
[facility] DEPT Provider Note   CSN: [id number] Arrival date & time: 05/20/22  1638     History  Chief Complaint  Patient presents with   Fever    Joshua [name] is a 86 y.o. male.   Fever    Patient with medical history of COPD, hypertension, hyperlipidemia, previous PEs on Eliquis without any missed doses, chronic respiratory failure 2 L supplemental oxygen baseline presents today due to shortness of breath and fever.  Was seen earlier today by his pulmonologist because he is not having a nonproductive cough and feeling worsening shortness of breath.  He was found to be febrile and tachycardic, tested positive for COVID in the office.  Sent from the lower pulmonology to ED for additional evaluation and management.  Patient denies any medicine prior to arrival, he endorses feeling short of breath with nonproductive cough and hip's.  Denies any chest pain or back pain with me, endorses chest tightness when he coughs but not at rest.  Also endorses lower extremity swelling.  Home Medications Prior to Admission medications   Medication Sig Start Date End Date Taking? Authorizing Provider  albuterol (VENTOLIN HFA) 108 (90 Base) MCG/ACT inhaler Inhale 1 puff into the lungs every 4 (four) hours as needed for shortness of breath.    [provider]  allopurinol (ZYLOPRIM) 100 MG tablet Take 100 mg by mouth every morning.    [provider]  atorvastatin (LIPITOR) 40 MG tablet Take 1 tablet (40 mg total) by mouth daily. 04/26/22   Milford, Maricela Bo, FNP  Azelastine HCl 0.15 % SOLN Place 2 sprays into the nose daily as needed (allergies). 02/22/21   [provider]  Cinnamon 500 MG capsule Take 500 mg by mouth every morning.    [provider]  ELIQUIS 5 MG TABS tablet TAKE 1 TABLET BY MOUTH TWICE A DAY 02/18/22   Larey Dresser, MD  finasteride (PROSCAR) 5 MG tablet Take 5 mg by mouth at bedtime.    [provider]   gabapentin (NEURONTIN) 100 MG capsule Take 300 mg by mouth 3 (three) times daily. 11/27/20   [provider]  magnesium oxide (MAG-OX) 400 MG tablet Take 1 tablet (400 mg total) by mouth daily. 02/18/22   Larey Dresser, MD  metoCLOPramide (REGLAN) 5 MG tablet Take 5 mg by mouth 3 (three) times daily before meals.    [provider]  midodrine (PROAMATINE) 10 MG tablet Take 1.5 tablets (15 mg total) by mouth 3 (three) times daily with meals. 10/17/21   Larey Dresser, MD  Multiple Vitamin (MULTIVITAMIN WITH MINERALS) TABS tablet Take 1 tablet by mouth daily. 05/01/21   Sheikh, Omair Latif, DO  ondansetron (ZOFRAN) 4 MG tablet Take 1 tablet (4 mg total) by mouth every 6 (six) hours as needed for nausea. 04/30/21   Raiford Noble Latif, DO  OXYGEN Inhale 2 L into the lungs continuous.    [provider]  pantoprazole (PROTONIX) 40 MG tablet Take 40 mg by mouth every morning.    [provider]  Riociguat 2 MG TABS Take 2 mg by mouth in the morning, at noon, and at bedtime. 10/08/21   Larey Dresser, MD  STIOLTO RESPIMAT 2.5-2.5 MCG/ACT AERS INHALE 2 PUFFS BY MOUTH INTO THE LUNGS DAILY Patient taking differently: Inhale 2 each into the lungs daily. 11/26/21   [name], MD  torsemide (DEMADEX) 20 MG tablet Take 1 tablet (20 mg total) by mouth daily.  04/26/22   Rafael Bihari, FNP      Allergies    Other, Sunflower oil, and Sulfa antibiotics    Review of Systems   Review of Systems  Constitutional:  Positive for fever.    Physical Exam Updated Vital Signs BP (!) 90/55   Pulse 96   Temp 98.1 F (36.7 C)   Resp 19   SpO2 96%  Physical Exam Vitals and nursing note reviewed. Exam conducted with a chaperone present.  Constitutional:      Appearance: Normal appearance. He is ill-appearing.  HENT:     Head: Normocephalic and atraumatic.  Eyes:     General: No scleral icterus.       Right eye: No discharge.        Left eye: No discharge.      Extraocular Movements: Extraocular movements intact.     Pupils: Pupils are equal, round, and reactive to light.  Cardiovascular:     Rate and Rhythm: Normal rate and regular rhythm.     Pulses: Normal pulses.     Heart sounds: Normal heart sounds. No murmur heard.    No friction rub. No gallop.  Pulmonary:     Effort: Pulmonary effort is normal. Tachypnea present.     Breath sounds: Normal breath sounds. Decreased air movement present.  Abdominal:     General: Abdomen is flat. Bowel sounds are normal. There is no distension.     Palpations: Abdomen is soft.     Tenderness: There is no abdominal tenderness.  Musculoskeletal:     Right lower leg: Edema present.     Left lower leg: Edema present.  Skin:    General: Skin is warm and dry.     Coloration: Skin is not jaundiced.  Neurological:     Mental Status: He is alert. Mental status is at baseline.     Coordination: Coordination normal.     ED Results / Procedures / Treatments   Labs (all labs ordered are listed, but only abnormal results are displayed) Labs Reviewed  CBC WITH DIFFERENTIAL/PLATELET - Abnormal; Notable for the following components:      Result Value   RBC 3.97 (*)    Hemoglobin 11.0 (*)    HCT 34.5 (*)    Lymphs Abs 0.3 (*)    All other components within normal limits  COMPREHENSIVE METABOLIC PANEL - Abnormal; Notable for the following components:   Chloride 96 (*)    Glucose, Bld 132 (*)    BUN 39 (*)    Creatinine, Ser 1.92 (*)    GFR, Estimated 34 (*)    All other components within normal limits  BRAIN NATRIURETIC PEPTIDE    EKG EKG Interpretation  Date/Time:  Monday May 20 2022 17:08:28 EDT Ventricular Rate:  114 PR Interval:  159 QRS Duration: 164 QT Interval:  357 QTC Calculation: 492 R Axis:   107 Text Interpretation: Sinus tachycardia Right bundle branch block since last tracing no significant change Confirmed by Malvin Johns [phone]) on 05/20/2022 6:03:04 PM  Radiology DG Chest  Portable 1 View  Result Date: 05/20/2022 CLINICAL DATA:  Shortness of breath, fever. EXAM: PORTABLE CHEST 1 VIEW COMPARISON:  March 06, 2022. FINDINGS: Stable cardiomegaly. Minimal left basilar atelectasis is noted. Right lung is unremarkable. Bony thorax is unremarkable. IMPRESSION: Minimal left basilar subsegmental atelectasis. Electronically Signed   By: Marijo Conception M.D.   On: 05/20/2022 17:56    Procedures Procedures    Medications Ordered in ED Medications  ipratropium-albuterol (DUONEB) 0.5-2.5 (3) MG/3ML nebulizer solution 3 mL (3 mLs Nebulization Given 05/20/22 1734)  methylPREDNISolone sodium succinate (SOLU-MEDROL) 125 mg/2 mL injection 125 mg (125 mg Intravenous Given 05/20/22 1735)  acetaminophen (TYLENOL) tablet 1,000 mg (1,000 mg Oral Given 05/20/22 1735)  ipratropium-albuterol (DUONEB) 0.5-2.5 (3) MG/3ML nebulizer solution 3 mL (3 mLs Nebulization Given 05/20/22 1756)  sodium chloride 0.9 % bolus 500 mL (500 mLs Intravenous New Bag/Given 05/20/22 1826)    ED Course/ Medical Decision Making/ A&P Clinical Course as of 05/20/22 1837  Mon May 20, 2022  1828 I spoke with pulmonology critical care and spoke with Dr. Erin Fulling, patient is high risk and they do not recommend patient to go home for outpatient antibiotics.  Advise admission to hospitalist service, stable for floor with pulmonology to consult.   [HS]    Clinical Course User Index [HS] Sherrill Raring, PA-C                           Medical Decision Making Amount and/or Complexity of Data Reviewed Labs: ordered. Radiology: ordered.  Risk OTC drugs. Prescription drug management. Decision regarding hospitalization.   Patient presents due to shortness of breath and fever as well as cough x4 days.  Differential includes not limited to CHF exacerbation, COPD exacerbation, COVID, pneumonia, atypical ACS.  On exam patient has decreased air movement but is not frankly tachypneic.  Trace bilateral lower extremity swelling.   Patient typically on 2 L, requiring 2-1/2 L supplemental oxygen today.  I have read pulmonology note from earlier today with Dr. Shyrl Numbers, patient sent to ED for additional evaluation and likely admission due to COVID and COPD patient.  I ordered, viewed and interpreted laboratory work-up. CBC without leukocytosis, baseline anemia. CMP shows slight decrease in chloride at 96, BUN slightly elevated at 39, mild AKI creatinine 1.92. Chest x-ray shows atelectasis and mild cardiomegaly.  EKG shows sinus tachycardia with right bundle branch block.  Patient is on cardiac monitoring with a rate in the high 90s.  IV patient's medication list.  I ordered DuoNeb x2, 125 Solu-Medrol and Tylenol x1063m.  Also ordered 500 mL saline bolus.  Initially I did consider sending the patient home with antiviral medicine.  However, concern for COPD exacerbation, slight increased supplemental oxygen requirement and COVID.   I did consult with pulmonology again and they would like patient admitted to hospital service and they will consult and see the patient.    I will consult hospitalist service for admission.  Discussed HPI, physical exam and plan of care for this patient with attending MLauretta Chester The attending physician evaluated this patient as part of a shared visit and agrees with plan of care.         Final Clinical Impression(s) / ED Diagnoses Final diagnoses:  COVID-19  COPD exacerbation ([place]  Hypotension, unspecified hypotension type    Rx / DC Orders ED Discharge Orders     None         SSherrill Raring PA-C 05/20/22 1910    BMalvin Johns MD 05/20/22 2119

## 2022-05-20 NOTE — Assessment & Plan Note (Signed)
Hold HTN meds.

## 2022-05-20 NOTE — Assessment & Plan Note (Signed)
Continue midodrine. Will need additional preload with IVF.

## 2022-05-20 NOTE — ED Notes (Signed)
Pulmonology office called, sending pt Covid +, Fever 103, pt has resp history. Low sats. Will be coming POV

## 2022-05-20 NOTE — Assessment & Plan Note (Addendum)
Onset of symptoms 05/18/22 / vax x 3 remotely / baseline 02 dep resp failure  GOLD 2 COPD   >>> to [facility] ER for eval / likely admit since 02 dep at rest   Discussed in detail all the  indications, usual  risks and alternatives  relative to the benefits with patient who agrees to proceed with Rx as outlined.             Each maintenance medication was reviewed in detail including emphasizing most importantly the difference between maintenance and prns and under what circumstances the prns are to be triggered using an action plan format where appropriate.  Total time for H and P, chart review, counseling, reviewing 02 device(s) and generating customized AVS unique to this office visit / same day charting  > 30 min with pt new to me

## 2022-05-20 NOTE — Assessment & Plan Note (Signed)
Likely due to volume depletion. Given IVF in ER. Still hypotensive. Start maintenance IVF. Hold diuretics. Appears to also have orthostatic hypotension due to RV failure. Is on midodrine at home.

## 2022-05-20 NOTE — Assessment & Plan Note (Signed)
Stable. Continue with proscar.

## 2022-05-21 DIAGNOSIS — I5032 Chronic diastolic (congestive) heart failure: Secondary | ICD-10-CM | POA: Diagnosis present

## 2022-05-21 DIAGNOSIS — Z86711 Personal history of pulmonary embolism: Secondary | ICD-10-CM | POA: Diagnosis not present

## 2022-05-21 DIAGNOSIS — M109 Gout, unspecified: Secondary | ICD-10-CM | POA: Diagnosis present

## 2022-05-21 DIAGNOSIS — J439 Emphysema, unspecified: Secondary | ICD-10-CM | POA: Diagnosis present

## 2022-05-21 DIAGNOSIS — N401 Enlarged prostate with lower urinary tract symptoms: Secondary | ICD-10-CM | POA: Diagnosis present

## 2022-05-21 DIAGNOSIS — J9611 Chronic respiratory failure with hypoxia: Secondary | ICD-10-CM | POA: Diagnosis present

## 2022-05-21 DIAGNOSIS — Z8672 Personal history of thrombophlebitis: Secondary | ICD-10-CM | POA: Diagnosis not present

## 2022-05-21 DIAGNOSIS — I5082 Biventricular heart failure: Secondary | ICD-10-CM | POA: Diagnosis present

## 2022-05-21 DIAGNOSIS — M7989 Other specified soft tissue disorders: Secondary | ICD-10-CM | POA: Diagnosis present

## 2022-05-21 DIAGNOSIS — I951 Orthostatic hypotension: Secondary | ICD-10-CM | POA: Diagnosis present

## 2022-05-21 DIAGNOSIS — Z882 Allergy status to sulfonamides status: Secondary | ICD-10-CM | POA: Diagnosis not present

## 2022-05-21 DIAGNOSIS — Z7901 Long term (current) use of anticoagulants: Secondary | ICD-10-CM | POA: Diagnosis not present

## 2022-05-21 DIAGNOSIS — Z79899 Other long term (current) drug therapy: Secondary | ICD-10-CM | POA: Diagnosis not present

## 2022-05-21 DIAGNOSIS — N1832 Chronic kidney disease, stage 3b: Secondary | ICD-10-CM | POA: Diagnosis present

## 2022-05-21 DIAGNOSIS — U071 COVID-19: Secondary | ICD-10-CM | POA: Diagnosis present

## 2022-05-21 DIAGNOSIS — E869 Volume depletion, unspecified: Secondary | ICD-10-CM | POA: Diagnosis present

## 2022-05-21 DIAGNOSIS — K219 Gastro-esophageal reflux disease without esophagitis: Secondary | ICD-10-CM | POA: Diagnosis present

## 2022-05-21 DIAGNOSIS — I252 Old myocardial infarction: Secondary | ICD-10-CM | POA: Diagnosis not present

## 2022-05-21 DIAGNOSIS — I2729 Other secondary pulmonary hypertension: Secondary | ICD-10-CM | POA: Diagnosis present

## 2022-05-21 DIAGNOSIS — I130 Hypertensive heart and chronic kidney disease with heart failure and stage 1 through stage 4 chronic kidney disease, or unspecified chronic kidney disease: Secondary | ICD-10-CM | POA: Diagnosis present

## 2022-05-21 DIAGNOSIS — Z9981 Dependence on supplemental oxygen: Secondary | ICD-10-CM | POA: Diagnosis not present

## 2022-05-21 DIAGNOSIS — Z8249 Family history of ischemic heart disease and other diseases of the circulatory system: Secondary | ICD-10-CM | POA: Diagnosis not present

## 2022-05-21 DIAGNOSIS — E785 Hyperlipidemia, unspecified: Secondary | ICD-10-CM | POA: Diagnosis present

## 2022-05-21 LAB — CBC WITH DIFFERENTIAL/PLATELET
Abs Immature Granulocytes: 0.01 10*3/uL (ref 0.00–0.07)
Basophils Absolute: 0 10*3/uL (ref 0.0–0.1)
Basophils Relative: 0 %
Eosinophils Absolute: 0 10*3/uL (ref 0.0–0.5)
Eosinophils Relative: 0 %
HCT: 32.6 % — ABNORMAL LOW (ref 39.0–52.0)
Hemoglobin: 10.5 g/dL — ABNORMAL LOW (ref 13.0–17.0)
Immature Granulocytes: 0 %
Lymphocytes Relative: 6 %
Lymphs Abs: 0.2 10*3/uL — ABNORMAL LOW (ref 0.7–4.0)
MCH: 28.2 pg (ref 26.0–34.0)
MCHC: 32.2 g/dL (ref 30.0–36.0)
MCV: 87.4 fL (ref 80.0–100.0)
Monocytes Absolute: 0.1 10*3/uL (ref 0.1–1.0)
Monocytes Relative: 3 %
Neutro Abs: 3.6 10*3/uL (ref 1.7–7.7)
Neutrophils Relative %: 91 %
Platelets: 165 10*3/uL (ref 150–400)
RBC: 3.73 MIL/uL — ABNORMAL LOW (ref 4.22–5.81)
RDW: 14.6 % (ref 11.5–15.5)
WBC: 3.9 10*3/uL — ABNORMAL LOW (ref 4.0–10.5)
nRBC: 0 % (ref 0.0–0.2)

## 2022-05-21 LAB — COMPREHENSIVE METABOLIC PANEL
ALT: 27 U/L (ref 0–44)
AST: 43 U/L — ABNORMAL HIGH (ref 15–41)
Albumin: 3.3 g/dL — ABNORMAL LOW (ref 3.5–5.0)
Alkaline Phosphatase: 83 U/L (ref 38–126)
Anion gap: 8 (ref 5–15)
BUN: 36 mg/dL — ABNORMAL HIGH (ref 8–23)
CO2: 31 mmol/L (ref 22–32)
Calcium: 9.4 mg/dL (ref 8.9–10.3)
Chloride: 101 mmol/L (ref 98–111)
Creatinine, Ser: 1.49 mg/dL — ABNORMAL HIGH (ref 0.61–1.24)
GFR, Estimated: 46 mL/min — ABNORMAL LOW (ref >60)
Glucose, Bld: 162 mg/dL — ABNORMAL HIGH (ref 70–99)
Potassium: 3.9 mmol/L (ref 3.5–5.1)
Sodium: 140 mmol/L (ref 135–145)
Total Bilirubin: 0.6 mg/dL (ref 0.3–1.2)
Total Protein: 7 g/dL (ref 6.5–8.1)

## 2022-05-21 LAB — C-REACTIVE PROTEIN: CRP: 10.6 mg/dL — ABNORMAL HIGH (ref <1.0)

## 2022-05-21 LAB — MAGNESIUM: Magnesium: 1.4 mg/dL — ABNORMAL LOW (ref 1.7–2.4)

## 2022-05-21 LAB — BRAIN NATRIURETIC PEPTIDE: B Natriuretic Peptide: 1163.8 pg/mL — ABNORMAL HIGH (ref 0.0–100.0)

## 2022-05-21 LAB — FERRITIN: Ferritin: 36 ng/mL (ref 24–336)

## 2022-05-21 LAB — LACTATE DEHYDROGENASE: LDH: 100 U/L (ref 98–192)

## 2022-05-21 MED ORDER — IPRATROPIUM-ALBUTEROL 0.5-2.5 (3) MG/3ML IN SOLN: 3.0000 mL | Freq: Four times a day (QID) | RESPIRATORY_TRACT | Status: DC | PRN

## 2022-05-21 MED ORDER — MAGNESIUM SULFATE 2 GM/50ML IV SOLN
2.0000 g | Freq: Once | INTRAVENOUS | Status: AC
  Administered 2022-05-21: 2 g via INTRAVENOUS
  Filled 2022-05-21: qty 50

## 2022-05-21 MED ORDER — ALBUTEROL SULFATE (2.5 MG/3ML) 0.083% IN NEBU: 2.5000 mg | INHALATION_SOLUTION | Freq: Four times a day (QID) | RESPIRATORY_TRACT | Status: DC | PRN

## 2022-05-21 MED ORDER — IPRATROPIUM-ALBUTEROL 0.5-2.5 (3) MG/3ML IN SOLN: 3.0000 mL | Freq: Two times a day (BID) | RESPIRATORY_TRACT | Status: DC

## 2022-05-21 NOTE — Progress Notes (Signed)
Joshua NOTE    Joshua [name]  NIO:[id number] DOB: 08/13/1936 DOA: 05/20/2022 PCP: [name], DO    Brief Narrative:  86 year old with history of COPD, chronic hypoxemic respiratory failure on 2 L oxygen at home, chronic right-sided heart failure with pulmonary hypertension, orthostatic hypotension on midodrine, hyperlipidemia, history of PE on Eliquis, CKD stage IIIb with baseline creatinine 1.5 present to the ER from pulmonary office where he presented with 2 days of being sick, fever and shortness of breath.  He went to pulmonary office.  He was found to have positive COVID-19 virus.  In the emergency room temperature 100.8, heart rate 119, blood pressure 113 x 58.  80% on room air but he does use oxygen at home.  He was noted to have low blood pressure 90/55, fluid boluses were given.  Chest x-ray with no pneumonia.   Assessment & Plan:   COVID-19 viral infection with COPD exacerbation: Agree with admission to monitored unit because of severity of symptoms. Aggressive bronchodilator therapy, IV steroids followed by oral steroids inhalational steroids, scheduled and as needed bronchodilators, deep breathing exercises, incentive spirometry, chest physiotherapy. Supplemental oxygen to keep saturations more than 92%. Molnupiravir for 5 days.  Hypotension: Mostly chronic.  Given some IV fluid boluses.  We will discontinue further IV fluids as his blood pressures are adequate.  Patient is on midodrine 15 mg 3 times a day that he will continue.  Chronic medical issues including CKD stage IIIb with baseline creatinine about 8.1-8.2 Chronic diastolic heart failure and known pulmonary hypertension on maintenance therapy GERD on Protonix BPH on Proscar Chronic hypoxemia on home oxygen History of pulm embolism on Eliquis Pulmonary hypertension on riociguat 2 mg 3 times daily.  Hypomagnesemia: Replace aggressively today.  Keep on scheduled oral replacement.  Blood pressure is stable.   Can go to telemetry bed.  Chest physiotherapy to continue.   DVT prophylaxis: SCDs Start: 05/20/22 2019 apixaban (ELIQUIS) tablet 5 mg   Code Status: Full code Family Communication: Daughter at bedside Disposition Plan: Status is: Inpatient Remains inpatient appropriate because: Significant shortness of breath and hypoxemia.  Fever      Consultants:  None  Procedures:  None  Antimicrobials:  Molnupiravir 10/2---   Subjective: Patient was seen and examined.  Daughter arrived at the bedside.  He had some dry cough but feels much better than yesterday.  Denies any dizziness lightheadedness.  Denies any wheezing.  Objective: Vitals:   05/21/22 0803 05/21/22 1100 05/21/22 1130 05/21/22 1158  BP:  116/65 118/61   Pulse:  78 75   Resp:  13 13   Temp: 97.7 F (36.5 C)   97.8 F (36.6 C)  TempSrc: Oral   Oral  SpO2:  95% 95%     Intake/Output Summary (Last 24 hours) at 05/21/2022 1436 Last data filed at 05/21/2022 0425 Gross per 24 hour  Intake 500 ml  Output --  Net 500 ml   There were no vitals filed for this visit.  Examination:  General exam: Appears fairly comfortable.  He does have some dry cough. Respiratory system: Clear to auscultation. Respiratory effort normal.  No added sounds. Cardiovascular system: S1 & S2 heard, RRR. No pedal edema. Gastrointestinal system: Soft.  Nontender.  Bowel sound present.   Central nervous system: Alert and oriented. No focal neurological deficits. Extremities: Symmetric 5 x 5 power. Skin: No rashes, lesions or ulcers Psychiatry: Judgement and insight appear normal. Mood & affect appropriate.     Data Reviewed: I have personally  reviewed following labs and imaging studies  CBC: Recent Labs  Lab 05/20/22 1730 05/21/22 0419  WBC 6.0 3.9*  NEUTROABS 4.8 3.6  HGB 11.0* 10.5*  HCT 34.5* 32.6*  MCV 86.9 87.4  PLT 176 701   Basic Metabolic Panel: Recent Labs  Lab 05/20/22 1730 05/21/22 0419  NA 136 140  K 3.7 3.9   CL 96* 101  CO2 31 31  GLUCOSE 132* 162*  BUN 39* 36*  CREATININE 1.92* 1.49*  CALCIUM 9.6 9.4  MG  --  1.4*   GFR: Estimated Creatinine Clearance: 42.6 mL/min (A) (by C-G formula based on SCr of 1.49 mg/dL (H)). Liver Function Tests: Recent Labs  Lab 05/20/22 1730 05/21/22 0419  AST 37 43*  ALT 24 27  ALKPHOS 90 83  BILITOT 0.7 0.6  PROT 7.8 7.0  ALBUMIN 3.6 3.3*   No results for input(s): "LIPASE", "AMYLASE" in the last 168 hours. No results for input(s): "AMMONIA" in the last 168 hours. Coagulation Profile: No results for input(s): "INR", "PROTIME" in the last 168 hours. Cardiac Enzymes: No results for input(s): "CKTOTAL", "CKMB", "CKMBINDEX", "TROPONINI" in the last 168 hours. BNP (last 3 results) No results for input(s): "PROBNP" in the last 8760 hours. HbA1C: No results for input(s): "HGBA1C" in the last 72 hours. CBG: No results for input(s): "GLUCAP" in the last 168 hours. Lipid Profile: No results for input(s): "CHOL", "HDL", "LDLCALC", "TRIG", "CHOLHDL", "LDLDIRECT" in the last 72 hours. Thyroid Function Tests: No results for input(s): "TSH", "T4TOTAL", "FREET4", "T3FREE", "THYROIDAB" in the last 72 hours. Anemia Panel: Recent Labs    05/21/22 0419  FERRITIN 36   Sepsis Labs: No results for input(s): "PROCALCITON", "LATICACIDVEN" in the last 168 hours.  No results found for this or any previous visit (from the past 240 hour(s)).       Radiology Studies: DG Chest Portable 1 View  Result Date: 05/20/2022 CLINICAL DATA:  Shortness of breath, fever. EXAM: PORTABLE CHEST 1 VIEW COMPARISON:  March 06, 2022. FINDINGS: Stable cardiomegaly. Minimal left basilar atelectasis is noted. Right lung is unremarkable. Bony thorax is unremarkable. IMPRESSION: Minimal left basilar subsegmental atelectasis. Electronically Signed   By: Marijo Conception M.D.   On: 05/20/2022 17:56        Scheduled Meds:  apixaban  5 mg Oral BID   atorvastatin  40 mg Oral Daily    finasteride  5 mg Oral QHS   ipratropium-albuterol  3 mL Nebulization BID   methylPREDNISolone (SOLU-MEDROL) injection  40 mg Intravenous Q12H   midodrine  15 mg Oral TID WC   molnupiravir EUA  4 capsule Oral BID   pantoprazole  40 mg Oral q morning   Riociguat  2 mg Oral TID AC   Continuous Infusions:   LOS: 0 days    Time spent: 35 minutes    Barb Merino, MD [facility] Pager [phone]

## 2022-05-21 NOTE — Progress Notes (Signed)
Discharge Progress Report  Patient Details  Name: Joshua [name] MRN: [id number] Date of Birth: 05-27-1936 Referring Provider:   [facility] Walk Test from 03/13/2022 in [place]  Referring Provider Elsworth Soho        Number of Visits: 16  Reason for Discharge:  Patient has met program and personal goals.  Smoking History:  Social History   Tobacco Use  Smoking Status Former   Packs/day: 1.00   Years: 50.00   Total pack years: 50.00   Types: Cigarettes   Start date: 30   Quit date: 08/19/2006   Years since quitting: 15.7  Smokeless Tobacco Never  Tobacco Comments   Former smoke 03/15/22    Diagnosis:  Stage 2 moderate COPD by GOLD classification ([place])  ADL [facility]:  Pulmonary Assessment Scores     Row Name 03/13/22 1200 05/04/22 0945 05/21/22 0732     ADL [facility]   ADL Phase Entry Exit --   SOB Score total 48 53 --     CAT Score   CAT Score 20 17 --     mMRC Score   mMRC Score 1 -- 2            Initial Exercise Prescription:  Initial Exercise Prescription - 03/13/22 1200       Date of Initial Exercise RX and Referring Provider   Date 03/13/22    Referring Provider Elsworth Soho    Expected Discharge Date 05/16/22      Oxygen   Oxygen Continuous    Liters 2    Maintain Oxygen Saturation 88% or higher      NuStep   Level 2    SPM 80    Minutes 15      Track   Minutes 15    METs 2.3      Prescription Details   Frequency (times per week) 2    Duration Progress to 30 minutes of continuous aerobic without signs/symptoms of physical distress      Intensity   THRR 40-80% of Max Heartrate 54-109    Ratings of Perceived Exertion 11-13    Perceived Dyspnea 0-4      Progression   Progression Continue progressive overload as per policy without signs/symptoms or physical distress.      Resistance Training   Training Prescription Yes    Weight blue bands    Reps 10-15             Discharge Exercise  Prescription (Final Exercise Prescription Changes):  Exercise Prescription Changes - 05/07/22 1100       Response to Exercise   Blood Pressure (Admit) 111/58    Blood Pressure (Exercise) 120/70    Blood Pressure (Exit) 104/64    Heart Rate (Admit) 78 bpm    Heart Rate (Exercise) 100 bpm    Heart Rate (Exit) 84 bpm    Oxygen Saturation (Admit) 98 %    Oxygen Saturation (Exercise) 90 %    Oxygen Saturation (Exit) 96 %    Rating of Perceived Exertion (Exercise) 13    Perceived Dyspnea (Exercise) 2    Duration Continue with 30 min of aerobic exercise without signs/symptoms of physical distress.    Intensity THRR unchanged      Progression   Progression Continue to progress workloads to maintain intensity without signs/symptoms of physical distress.      Resistance Training   Training Prescription Yes    Weight blue bands    Reps 10-15  Time 10 Minutes      Oxygen   Oxygen Continuous    Liters 3      NuStep   Level 3    SPM 80    Minutes 15    METs 2.2      Track   Laps 13    Minutes 15    METs 2.51      Oxygen   Maintain Oxygen Saturation 88% or higher             Functional Capacity:  6 Minute Walk     Row Name 03/13/22 1216 05/21/22 0727       6 Minute Walk   Phase Initial Discharge    Distance 1110 feet 1400 feet    Distance % Change -- 26.13 %    Distance Feet Change -- 290 ft    Walk Time 6 minutes 6 minutes    # of Rest Breaks 1  4:10-4:40 0    MPH 2.1 2.65    METS 2.3 2.77    RPE 12 12    Perceived Dyspnea  2 2    VO2 Peak 8.05 9.68    Symptoms No No    Resting HR 98 bpm 83 bpm    Resting BP 114/70 118/64    Resting Oxygen Saturation  95 % 100 %    Exercise Oxygen Saturation  during 6 min walk 88 % 90 %    Max Ex. HR 112 bpm 107 bpm    Max Ex. BP 116/66 116/60    2 Minute Post BP 112/60 108/60      Interval HR   1 Minute HR 84 83    2 Minute HR 94 84    3 Minute HR 100 88    4 Minute HR 98 100    5 Minute HR 112 100    6 Minute  HR 104 107    2 Minute Post HR 83 91    Interval Heart Rate? Yes Yes      Interval Oxygen   Interval Oxygen? Yes Yes    Baseline Oxygen Saturation % 95 %  2L 100 %    1 Minute Oxygen Saturation % 99 % 94 %    1 Minute Liters of Oxygen 2 L 3 L    2 Minute Oxygen Saturation % 95 % 94 %    2 Minute Liters of Oxygen 2 L 3 L    3 Minute Oxygen Saturation % 91 % 90 %    3 Minute Liters of Oxygen 2 L 3 L    4 Minute Oxygen Saturation % 88 % 95 %    4 Minute Liters of Oxygen 2 L 3 L    5 Minute Oxygen Saturation % 90 % 95 %    5 Minute Liters of Oxygen 2 L 3 L    6 Minute Oxygen Saturation % 90 % 91 %    6 Minute Liters of Oxygen 2 L 3 L    2 Minute Post Oxygen Saturation % 98 % 95 %    2 Minute Post Liters of Oxygen 2 L 3 L             Psychological, QOL, Others - Outcomes: PHQ 2/9:    03/13/2022   11:05 AM 05/09/2021    3:54 PM 03/03/2018   11:14 AM 11/03/2017   10:31 AM  Depression screen PHQ 2/9  Decreased Interest 0 0 0 0  Down, Depressed, Hopeless  0 0 0 0  PHQ - 2 Score 0 0 0 0  Altered sleeping 1  0   Tired, decreased energy 1  1   Change in appetite 0  0   Feeling bad or failure about yourself  0     Trouble concentrating 0  0   Moving slowly or fidgety/restless 0  0   Suicidal thoughts 0  0   PHQ-9 Score 2  1   Difficult doing work/chores Not difficult at all  Not difficult at all     Quality of Life:   Personal Goals: Goals established at orientation with interventions provided to work toward goal.  Personal Goals and Risk Factors at Admission - 03/13/22 1244       Core Components/Risk Factors/Patient Goals on Admission    Weight Management Weight Loss   Since 04/2019 when pt was hospitalized he has lost 62 pounds. He is concerned about this but they have not determined any medical reasons for this.He states the last month he has lost 6 pounds.   Improve shortness of breath with ADL's Yes    Intervention Provide education, individualized exercise plan and  daily activity instruction to help decrease symptoms of SOB with activities of daily living.    Expected Outcomes Short Term: Improve cardiorespiratory fitness to achieve a reduction of symptoms when performing ADLs;Long Term: Be able to perform more ADLs without symptoms or delay the onset of symptoms    Increase knowledge of respiratory medications and ability to use respiratory devices properly  Yes    Intervention Provide education and demonstration as needed of appropriate use of medications, inhalers, and oxygen therapy.    Expected Outcomes Short Term: Achieves understanding of medications use. Understands that oxygen is a medication prescribed by physician. Demonstrates appropriate use of inhaler and oxygen therapy.;Long Term: Maintain appropriate use of medications, inhalers, and oxygen therapy.              Personal Goals Discharge:  Goals and Risk Factor Review     Row Name 03/20/22 1718 04/16/22 0918 05/14/22 0904         Core Components/Risk Factors/Patient Goals Review   Personal Goals Review Improve shortness of breath with ADL's;Develop more efficient breathing techniques such as purse lipped breathing and diaphragmatic breathing and practicing self-pacing with activity.;Increase knowledge of respiratory medications and ability to use respiratory devices properly.;Other Weight Management/Obesity;Increase knowledge of respiratory medications and ability to use respiratory devices properly.;Improve shortness of breath with ADL's;Develop more efficient breathing techniques such as purse lipped breathing and diaphragmatic breathing and practicing self-pacing with activity. Weight Management/Obesity;Increase knowledge of respiratory medications and ability to use respiratory devices properly.;Improve shortness of breath with ADL's;Develop more efficient breathing techniques such as purse lipped breathing and diaphragmatic breathing and practicing self-pacing with activity.     Review  Pt has not started the progam yet due to being hosptialized currentlly. Will continue to mointer weight loss. Duc has gained 3 kg since starting pulmonary rehab, which is needed.  He lost a significant amount of weight in the last year d/t illness.  He has increased his stamina and strength and is now walking the track and exercising on the nustep.  He is enjoying the program and excited to increase his stamina. Dagan has been maintaining weight in pulmonary rehab. His functionial capacity has improved on the track and exercising on the Nustep.  Jusitn is about to graduate as he feels a difference in his conditioning level.  Expected Outcomes See admission goals. See admission goals. See admission goals.              Exercise Goals and Review:  Exercise Goals     Row Name 03/13/22 1212 03/20/22 1152 04/17/22 1048 05/13/22 0726       Exercise Goals   Increase Physical Activity Yes Yes Yes Yes    Intervention Provide advice, education, support and counseling about physical activity/exercise needs.;Develop an individualized exercise prescription for aerobic and resistive training based on initial evaluation findings, risk stratification, comorbidities and participant's personal goals. Provide advice, education, support and counseling about physical activity/exercise needs.;Develop an individualized exercise prescription for aerobic and resistive training based on initial evaluation findings, risk stratification, comorbidities and participant's personal goals. Provide advice, education, support and counseling about physical activity/exercise needs.;Develop an individualized exercise prescription for aerobic and resistive training based on initial evaluation findings, risk stratification, comorbidities and participant's personal goals. Provide advice, education, support and counseling about physical activity/exercise needs.;Develop an individualized exercise prescription for aerobic and  resistive training based on initial evaluation findings, risk stratification, comorbidities and participant's personal goals.    Expected Outcomes Short Term: Attend rehab on a regular basis to increase amount of physical activity.;Long Term: Add in home exercise to make exercise part of routine and to increase amount of physical activity.;Long Term: Exercising regularly at least 3-5 days a week. Short Term: Attend rehab on a regular basis to increase amount of physical activity.;Long Term: Add in home exercise to make exercise part of routine and to increase amount of physical activity.;Long Term: Exercising regularly at least 3-5 days a week. Short Term: Attend rehab on a regular basis to increase amount of physical activity.;Long Term: Add in home exercise to make exercise part of routine and to increase amount of physical activity.;Long Term: Exercising regularly at least 3-5 days a week. Short Term: Attend rehab on a regular basis to increase amount of physical activity.;Long Term: Add in home exercise to make exercise part of routine and to increase amount of physical activity.;Long Term: Exercising regularly at least 3-5 days a week.    Increase Strength and Stamina Yes Yes Yes Yes    Intervention Develop an individualized exercise prescription for aerobic and resistive training based on initial evaluation findings, risk stratification, comorbidities and participant's personal goals.;Provide advice, education, support and counseling about physical activity/exercise needs. Develop an individualized exercise prescription for aerobic and resistive training based on initial evaluation findings, risk stratification, comorbidities and participant's personal goals.;Provide advice, education, support and counseling about physical activity/exercise needs. Develop an individualized exercise prescription for aerobic and resistive training based on initial evaluation findings, risk stratification, comorbidities and  participant's personal goals.;Provide advice, education, support and counseling about physical activity/exercise needs. Develop an individualized exercise prescription for aerobic and resistive training based on initial evaluation findings, risk stratification, comorbidities and participant's personal goals.;Provide advice, education, support and counseling about physical activity/exercise needs.    Expected Outcomes Short Term: Increase workloads from initial exercise prescription for resistance, speed, and METs.;Short Term: Perform resistance training exercises routinely during rehab and add in resistance training at home;Long Term: Improve cardiorespiratory fitness, muscular endurance and strength as measured by increased METs and functional capacity (6MWT) Short Term: Increase workloads from initial exercise prescription for resistance, speed, and METs.;Short Term: Perform resistance training exercises routinely during rehab and add in resistance training at home;Long Term: Improve cardiorespiratory fitness, muscular endurance and strength as measured by increased METs and functional capacity (6MWT) Short Term: Increase workloads from  initial exercise prescription for resistance, speed, and METs.;Short Term: Perform resistance training exercises routinely during rehab and add in resistance training at home;Long Term: Improve cardiorespiratory fitness, muscular endurance and strength as measured by increased METs and functional capacity (6MWT) Short Term: Increase workloads from initial exercise prescription for resistance, speed, and METs.;Short Term: Perform resistance training exercises routinely during rehab and add in resistance training at home;Long Term: Improve cardiorespiratory fitness, muscular endurance and strength as measured by increased METs and functional capacity (6MWT)    Able to understand and use rate of perceived exertion (RPE) scale Yes Yes Yes Yes    Intervention Provide education and  explanation on how to use RPE scale Provide education and explanation on how to use RPE scale Provide education and explanation on how to use RPE scale Provide education and explanation on how to use RPE scale    Expected Outcomes Short Term: Able to use RPE daily in rehab to express subjective intensity level;Long Term:  Able to use RPE to guide intensity level when exercising independently Short Term: Able to use RPE daily in rehab to express subjective intensity level;Long Term:  Able to use RPE to guide intensity level when exercising independently Short Term: Able to use RPE daily in rehab to express subjective intensity level;Long Term:  Able to use RPE to guide intensity level when exercising independently Short Term: Able to use RPE daily in rehab to express subjective intensity level;Long Term:  Able to use RPE to guide intensity level when exercising independently    Able to understand and use Dyspnea scale Yes Yes Yes Yes    Intervention Provide education and explanation on how to use Dyspnea scale Provide education and explanation on how to use Dyspnea scale Provide education and explanation on how to use Dyspnea scale Provide education and explanation on how to use Dyspnea scale    Expected Outcomes Short Term: Able to use Dyspnea scale daily in rehab to express subjective sense of shortness of breath during exertion;Long Term: Able to use Dyspnea scale to guide intensity level when exercising independently Short Term: Able to use Dyspnea scale daily in rehab to express subjective sense of shortness of breath during exertion;Long Term: Able to use Dyspnea scale to guide intensity level when exercising independently Short Term: Able to use Dyspnea scale daily in rehab to express subjective sense of shortness of breath during exertion;Long Term: Able to use Dyspnea scale to guide intensity level when exercising independently Short Term: Able to use Dyspnea scale daily in rehab to express subjective  sense of shortness of breath during exertion;Long Term: Able to use Dyspnea scale to guide intensity level when exercising independently    Knowledge and understanding of Target Heart Rate Range (THRR) Yes Yes Yes Yes    Intervention Provide education and explanation of THRR including how the numbers were predicted and where they are located for reference Provide education and explanation of THRR including how the numbers were predicted and where they are located for reference Provide education and explanation of THRR including how the numbers were predicted and where they are located for reference Provide education and explanation of THRR including how the numbers were predicted and where they are located for reference    Expected Outcomes Short Term: Able to state/look up THRR;Long Term: Able to use THRR to govern intensity when exercising independently;Short Term: Able to use daily as guideline for intensity in rehab Short Term: Able to state/look up THRR;Long Term: Able to use THRR to  govern intensity when exercising independently;Short Term: Able to use daily as guideline for intensity in rehab Short Term: Able to state/look up THRR;Long Term: Able to use THRR to govern intensity when exercising independently;Short Term: Able to use daily as guideline for intensity in rehab Short Term: Able to state/look up THRR;Long Term: Able to use THRR to govern intensity when exercising independently;Short Term: Able to use daily as guideline for intensity in rehab    Understanding of Exercise Prescription Yes Yes Yes Yes    Intervention Provide education, explanation, and written materials on patient's individual exercise prescription Provide education, explanation, and written materials on patient's individual exercise prescription Provide education, explanation, and written materials on patient's individual exercise prescription Provide education, explanation, and written materials on patient's individual exercise  prescription    Expected Outcomes Short Term: Able to explain program exercise prescription;Long Term: Able to explain home exercise prescription to exercise independently Short Term: Able to explain program exercise prescription;Long Term: Able to explain home exercise prescription to exercise independently Short Term: Able to explain program exercise prescription;Long Term: Able to explain home exercise prescription to exercise independently Short Term: Able to explain program exercise prescription;Long Term: Able to explain home exercise prescription to exercise independently             Exercise Goals Re-Evaluation:  Exercise Goals Re-Evaluation     Row Name 03/20/22 1153 04/17/22 1049 05/13/22 0727         Exercise Goal Re-Evaluation   Exercise Goals Review Increase Physical Activity;Increase Strength and Stamina;Able to understand and use rate of perceived exertion (RPE) scale;Able to understand and use Dyspnea scale;Knowledge and understanding of Target Heart Rate Range (THRR);Understanding of Exercise Prescription Increase Physical Activity;Increase Strength and Stamina;Able to understand and use rate of perceived exertion (RPE) scale;Able to understand and use Dyspnea scale;Knowledge and understanding of Target Heart Rate Range (THRR);Understanding of Exercise Prescription Increase Physical Activity;Increase Strength and Stamina;Able to understand and use rate of perceived exertion (RPE) scale;Able to understand and use Dyspnea scale;Knowledge and understanding of Target Heart Rate Range (THRR);Understanding of Exercise Prescription     Comments Zaylin was scheduled to start exercise this week. Notes show that he has been in the hospital. Will continue to monitor. Yuya has completed 7 exercise sessions. He exercises for 15 min on the track and Nustep. He averages 2.51 METs on the track and 1.8 METs at level 2 on the Nustep. He performs the warmup and cooldown standing without  limitations. Ahamed has progressed to walking the track for 15 min. He has tolerated the track very well. He has also increased his workload on the Nustep. Shelden seems very motivated to exercise and improve his functional capacity. We recently discussed home exercise as Eidan is riding his bike at home. Wil continue to monitor and progress able. Tommie has completed 14 exercise sessions. He exercises for 15 min on the track and Nustep. He averages 2.62 METs on the track and 2.3 METs at level 3 on the Nustep. He performs the warmup and cooldown standing without limitations. Ranard has progressed on the track and Nustep. He has tolerates porgressions well. Michaeljoseph seems very motivated to exercise and improve his functional capacity. I am confident in [place] carrying out an exercise regimen at home. Will continue to monitor and progress as able.     Expected Outcomes Through exercise at rehab and home, the patient will decrease shortness of breath with daily activities and feel confident in carrying out an exercise regimen at home.  Through exercise at rehab and home, the patient will decrease shortness of breath with daily activities and feel confident in carrying out an exercise regimen at home. Through exercise at rehab and home, the patient will decrease shortness of breath with daily activities and feel confident in carrying out an exercise regimen at home.              Nutrition & Weight - Outcomes:  Pre Biometrics - 03/13/22 1030       Pre Biometrics   Grip Strength 23 kg              Nutrition:  Nutrition Therapy & Goals - 05/14/22 1228       Nutrition Therapy   Diet Heart Healthy Diet    Drug/Food Interactions Statins/Certain Fruits    Protein (specify units) 99g-124g/day (1.2-1.5g/kg actual body weight)      Personal Nutrition Goals   Nutrition Goal Patient to consume a daily variety of fruit, vegetables, lean protein/plant protein, whole grains, and dairy as part of  balanced diet.    Personal Goal #2 Patient to understand strategies for weight gain of 0.5-2.0# per week of weight gain.    Personal Goal #3 Patient to limit to <1556m of sodium daily.    Comments Goals in progress. CAntaviusreports good appetite and eating on a regular schedule. He reports good understanding of sodium intake, low sodium foods, and weight monitoring for for congestive heart failure. His wife remains a good support for dietary intake and disease management. He typically drinks ~1 nutrition supplement daily.      Intervention Plan   Intervention Prescribe, educate and counsel regarding individualized specific dietary modifications aiming towards targeted core components such as weight, hypertension, lipid management, diabetes, heart failure and other comorbidities.;Nutrition handout(s) given to patient.    Expected Outcomes Short Term Goal: Understand basic principles of dietary content, such as calories, fat, sodium, cholesterol and nutrients.;Long Term Goal: Adherence to prescribed nutrition plan.             Nutrition Discharge:  Nutrition Assessments - 05/02/22 1217       Rate Your Plate Scores   Post Score 45             Education Questionnaire Score:  Knowledge Questionnaire Score - 05/04/22 0945       Knowledge Questionnaire Score   Post Score 18/18             Goals reviewed with patient; copy given to patient.

## 2022-05-22 DIAGNOSIS — U071 COVID-19: Secondary | ICD-10-CM | POA: Diagnosis not present

## 2022-05-22 MED ORDER — GUAIFENESIN 100 MG/5ML PO LIQD: 10.0000 mL | Freq: Four times a day (QID) | ORAL | Status: DC | PRN

## 2022-05-22 MED ORDER — MOLNUPIRAVIR EUA 200MG CAPSULE: 4.0000 | ORAL_CAPSULE | Freq: Two times a day (BID) | ORAL | 0 refills | Status: AC

## 2022-05-22 MED ORDER — GUAIFENESIN 100 MG/5ML PO LIQD: 10.0000 mL | Freq: Four times a day (QID) | ORAL | 0 refills | Status: DC | PRN

## 2022-05-22 MED ORDER — BENZONATATE 100 MG PO CAPS: 100.0000 mg | ORAL_CAPSULE | Freq: Three times a day (TID) | ORAL | 0 refills | Status: DC | PRN

## 2022-05-22 MED ORDER — PREDNISONE 20 MG PO TABS: 20.0000 mg | ORAL_TABLET | Freq: Every day | ORAL | 0 refills | Status: AC

## 2022-05-22 MED ORDER — BENZONATATE 100 MG PO CAPS: 100.0000 mg | ORAL_CAPSULE | Freq: Three times a day (TID) | ORAL | Status: DC | PRN

## 2022-05-22 NOTE — Evaluation (Signed)
Physical Therapy Evaluation Patient Details Name: Joshua [name] MRN: [id number] DOB: [birth year] Today's Date: 05/22/2022  History of Present Illness  Patient is 86 y.o. male with history of COPD, chronic hypoxemic respiratory failure on 2 L oxygen at home, chronic right-sided heart failure with pulmonary hypertension, orthostatic hypotension on midodrine, hyperlipidemia, history of PE on Eliquis, CKD stage IIIb with baseline creatinine 1.5 present to the ER from pulmonary office where he presented with 2 days of being sick, fever and shortness of breath.  He went to pulmonary office.  He was found to have positive COVID-19 virus.  In the emergency room temperature 100.8, heart rate 119, blood pressure 113 x 58.  80% on room air but he does use oxygen at home.  He was noted to have low blood pressure 90/55, fluid boluses were given.  Chest x-ray with no pneumonia.    Clinical Impression  Joshua [name] is 86 y.o. male admitted with above HPI and diagnosis. Patient is currently limited by functional impairments below (see PT problem list). Patient lives with his spouse and is independent with no device for mobility at baseline. He uses 2L/min supplemental O2 with occasional increase to 3L for activity. Pt ambulated with min guard/supervision today and was able to manage portable O2 without cues or assist. SpO2 88% or greater during gait on 2L and pt recovered to 97% after ~1 minutes seated rest. Patient will benefit from continued skilled PT interventions to address impairments and progress independence with mobility, anticipate not follow up PT needs when medically ready for discharge home. Acute PT will follow and progress as able.        Recommendations for follow up therapy are one component of a multi-disciplinary discharge planning process, led by the attending physician.  Recommendations may be updated based on patient status, additional functional criteria and insurance authorization.  Follow  Up Recommendations No PT follow up      Assistance Recommended at Discharge Intermittent Supervision/Assistance  Patient can return home with the following  A little help with walking and/or transfers;A little help with bathing/dressing/bathroom;Assistance with cooking/housework;Assist for transportation;Help with stairs or ramp for entrance    Equipment Recommendations None recommended by PT  Recommendations for Other Services       Functional Status Assessment Patient has had a recent decline in their functional status and demonstrates the ability to make significant improvements in function in a reasonable and predictable amount of time.     Precautions / Restrictions Precautions Precautions: Fall Precaution Comments: 2L/min at baseline Restrictions Weight Bearing Restrictions: No      Mobility  Bed Mobility Overal bed mobility: Modified Independent             General bed mobility comments: pt sitting up EOB on arrival    Transfers Overall transfer level: Needs assistance Equipment used: None Transfers: Sit to/from Stand Sit to Stand: Supervision           General transfer comment: sup for safety    Ambulation/Gait Ambulation/Gait assistance: Min guard, Supervision Gait Distance (Feet): 500 Feet Assistive device: None (portable O2) Gait Pattern/deviations: WFL(Within Functional Limits), Decreased stride length Gait velocity: fair     General Gait Details: pt able to manage portable O2 wihtout cues of assist, no overt LOB. HR in 110's and SpO2 >88% on 2L with recovery to 97% at end of gait.  Stairs            Wheelchair Mobility    Modified Rankin (Stroke Patients Only)  Balance Overall balance assessment: Needs assistance Sitting-balance support: Feet supported Sitting balance-Leahy Scale: Good     Standing balance support: No upper extremity supported, During functional activity Standing balance-Leahy Scale: Good Standing  balance comment: overall steady with gait and able to pull O2 cannister without assist                             Pertinent Vitals/Pain Pain Assessment Pain Assessment: No/denies pain    Home Living Family/patient expects to be discharged to:: Private residence Living Arrangements: Spouse/significant other Available Help at Discharge: Family;Available 24 hours/day Type of Home: House Home Access: Stairs to enter Entrance Stairs-Rails: Right Entrance Stairs-Number of Steps: 6 Alternate Level Stairs-Number of Steps: 6+landing+6+landing+4 Home Layout: Two level;Bed/bath upstairs Home Equipment: Rolling Walker (2 wheels);Cane - single point;BSC/3in1;Shower seat Additional Comments: supplemental O2 use PRN; 2L/min at baseline, occasionally 3L if doing intense walking/activity    Prior Function Prior Level of Function : Independent/Modified Independent             Mobility Comments: Indep without DME; reports no issues navigating stairs 2-3x daily, but spends most of day upstairs. ADLs Comments: reports indep without DME. Wife does majority of household tasks and driving. Pt hires assist for yard upkeep     Hand Dominance   Dominant Hand: Right    Extremity/Trunk Assessment   Upper Extremity Assessment Upper Extremity Assessment: Overall WFL for tasks assessed    Lower Extremity Assessment Lower Extremity Assessment: Overall WFL for tasks assessed (5xSit<>Stand: required min assist to rise without UE use, 19 seconds)    Cervical / Trunk Assessment Cervical / Trunk Assessment: Normal  Communication   Communication: No difficulties  Cognition Arousal/Alertness: Awake/alert Behavior During Therapy: WFL for tasks assessed/performed Overall Cognitive Status: Within Functional Limits for tasks assessed                                          General Comments      Exercises     Assessment/Plan    PT Assessment Patient needs continued PT  services  PT Problem List Decreased activity tolerance;Decreased balance;Decreased mobility;Decreased knowledge of use of DME;Decreased safety awareness;Decreased knowledge of precautions;Cardiopulmonary status limiting activity       PT Treatment Interventions DME instruction;Gait training;Stair training;Functional mobility training;Therapeutic activities;Therapeutic exercise;Balance training;Patient/family education    PT Goals (Current goals can be found in the Care Plan section)  Acute Rehab PT Goals Patient Stated Goal: get home PT Goal Formulation: With patient Time For Goal Achievement: 06/05/22 Potential to Achieve Goals: Good    Frequency Min 3X/week     Co-evaluation               AM-PAC PT "6 Clicks" Mobility  Outcome Measure Help needed turning from your back to your side while in a flat bed without using bedrails?: None Help needed moving from lying on your back to sitting on the side of a flat bed without using bedrails?: None Help needed moving to and from a bed to a chair (including a wheelchair)?: A Little Help needed standing up from a chair using your arms (e.g., wheelchair or bedside chair)?: A Little Help needed to walk in hospital room?: A Little Help needed climbing 3-5 steps with a railing? : A Little 6 Click Score: 20    End of Session Equipment Utilized During  Treatment: Gait belt;Oxygen Activity Tolerance: Patient tolerated treatment well Patient left: in chair;with call bell/phone within reach Nurse Communication: Mobility status PT Visit Diagnosis: Unsteadiness on feet (R26.81);Difficulty in walking, not elsewhere classified (R26.2)    Time: 2111-5520 PT Time Calculation (min) (ACUTE ONLY): 19 min   Charges:   PT Evaluation $PT Eval Low Complexity: 1 Low          Verner Mould, DPT Acute Rehabilitation Services Office [phone]  05/22/22 10:01 AM

## 2022-05-22 NOTE — Discharge Summary (Signed)
Physician Discharge Summary  Joshua [name] PJA:[id number] DOB: August 29, 1935 DOA: 05/20/2022  PCP: [name], DO  Admit date: 05/20/2022 Discharge date: 05/22/2022  Admitted From: Home Disposition: Home  Recommendations for Outpatient Follow-up:  Follow up with PCP in 1-2 weeks Follow-up with pulmonary as scheduled  Home Health: Not applicable Equipment/Devices: Oxygen at home, already available  Discharge Condition: Stable CODE STATUS: Full code Diet recommendation: Low-salt diet  Discharge summary: 86 year old with history of COPD, chronic hypoxemic respiratory failure on 2 L oxygen at home, chronic right-sided heart failure with pulmonary hypertension, orthostatic hypotension on midodrine, hyperlipidemia, history of PE on Eliquis, CKD stage IIIb with baseline creatinine 1.5 present to the ER from pulmonary office where he presented with 2 days of being sick, fever and shortness of breath.  He went to pulmonary office.  He was found to have positive COVID-19 virus.  In the emergency room temperature 100.8, heart rate 119, blood pressure 113 x 58.  80% on room air but he does use oxygen at home.  He was noted to have low blood pressure 90/55, fluid boluses were given.  Chest x-ray with no pneumonia.  COVID-19 viral infection causing COPD exacerbation: Treated with bronchodilator therapy, IV steroids and subsequently oral steroids, steroid inhalers and nebulizers.  Molnupiravir for 5 days.  Clinically improved.  Afebrile last 24 hours.  Tolerating mobility on 2 L oxygen that he uses at home. -Complete molnupiravir therapy for 5 days -Prednisone 20 mg daily for additional 5 days -He is already optimized with bronchodilator therapy and inhalers at home that he will continue. -Patient will go back on all his long-term medications including Riociguat that he takes for pulmonary hypertension. -Patient is on midodrine 15 mg 3 times daily that he will continue. Electrolytes were replaced and  adequate.  He is on chronic magnesium therapy.  Stable for discharge.   Discharge Diagnoses:  Principal Problem:   COVID-19 virus infection Active Problems:   Hypotension   Personal history of pulmonary embolism   Chronic respiratory failure with hypoxia (HCC) - on home O2 @ 2 L/min   Hypertension   Benign prostatic hyperplasia with lower urinary tract symptoms   Gastroesophageal reflux disease without esophagitis   COPD with emphysema (HCC)   Dyslipidemia   Chronic diastolic CHF (congestive heart failure) (HCC)   Stage 3b chronic kidney disease (CKD) (HCC) - baseline SCr 1.5-1.8   Pulmonary hypertension ([place])   Right heart failure with reduced right ventricular function [facility])    Discharge Instructions  Discharge Instructions     Call MD for:  difficulty breathing, headache or visual disturbances   Complete by: As directed    Diet general   Complete by: As directed    Increase activity slowly   Complete by: As directed       Allergies as of 05/22/2022       Reactions   Other Swelling   Beer- Swelling    Sunflower Oil Swelling   Sulfa Antibiotics Rash        Medication List     STOP taking these medications    ondansetron 4 MG tablet Commonly known as: ZOFRAN       TAKE these medications    albuterol 108 (90 Base) MCG/ACT inhaler Commonly known as: VENTOLIN HFA Inhale 1 puff into the lungs every 4 (four) hours as needed for shortness of breath.   allopurinol 100 MG tablet Commonly known as: ZYLOPRIM Take 100 mg by mouth every morning.   atorvastatin 40 MG  tablet Commonly known as: LIPITOR Take 1 tablet (40 mg total) by mouth daily.   Azelastine HCl 0.15 % Soln Place 2 sprays into the nose daily as needed (allergies).   benzonatate 100 MG capsule Commonly known as: TESSALON Take 1 capsule (100 mg total) by mouth 3 (three) times daily as needed for cough.   Cinnamon 500 MG capsule Take 500 mg by mouth every morning.   Eliquis 5 MG Tabs  tablet Generic drug: apixaban TAKE 1 TABLET BY MOUTH TWICE A DAY   finasteride 5 MG tablet Commonly known as: PROSCAR Take 5 mg by mouth at bedtime.   gabapentin 300 MG capsule Commonly known as: NEURONTIN Take 300 mg by mouth 3 (three) times daily.   guaiFENesin 100 MG/5ML liquid Commonly known as: ROBITUSSIN Take 10 mLs by mouth every 6 (six) hours as needed for cough or to loosen phlegm.   Linzess 72 MCG capsule Generic drug: linaclotide Take 72 mcg by mouth every morning.   magnesium oxide 400 MG tablet Commonly known as: MAG-OX Take 1 tablet (400 mg total) by mouth daily.   metoCLOPramide 5 MG tablet Commonly known as: REGLAN Take 5 mg by mouth 3 (three) times daily before meals.   midodrine 5 MG tablet Commonly known as: PROAMATINE Take 15 mg by mouth 3 (three) times daily. What changed: Another medication with the same name was removed. Continue taking this medication, and follow the directions you see here.   molnupiravir EUA 200 mg Caps capsule Commonly known as: LAGEVRIO Take 4 capsules (800 mg total) by mouth 2 (two) times daily for 5 days.   multivitamin with minerals Tabs tablet Take 1 tablet by mouth daily.   OXYGEN Inhale 2 L into the lungs continuous.   pantoprazole 40 MG tablet Commonly known as: PROTONIX Take 40 mg by mouth every morning.   predniSONE 20 MG tablet Commonly known as: DELTASONE Take 1 tablet (20 mg total) by mouth daily for 5 days.   Riociguat 2 MG Tabs Take 2 mg by mouth in the morning, at noon, and at bedtime.   Stiolto Respimat 2.5-2.5 MCG/ACT Aers Generic drug: Tiotropium Bromide-Olodaterol INHALE 2 PUFFS BY MOUTH INTO THE LUNGS DAILY What changed: See the new instructions.   torsemide 20 MG tablet Commonly known as: DEMADEX Take 1 tablet (20 mg total) by mouth daily.        Allergies  Allergen Reactions   Other Swelling    Beer- Swelling    Sunflower Oil Swelling   Sulfa Antibiotics Rash     Consultations: None   Procedures/Studies: DG Chest Portable 1 View  Result Date: 05/20/2022 CLINICAL DATA:  Shortness of breath, fever. EXAM: PORTABLE CHEST 1 VIEW COMPARISON:  March 06, 2022. FINDINGS: Stable cardiomegaly. Minimal left basilar atelectasis is noted. Right lung is unremarkable. Bony thorax is unremarkable. IMPRESSION: Minimal left basilar subsegmental atelectasis. Electronically Signed   By: Marijo Conception M.D.   On: 05/20/2022 17:56   (Echo, Carotid, EGD, Colonoscopy, ERCP)    Subjective: Patient seen and examined in the morning rounds.  He was working with physical therapy and moved around in the hallway and did very well.  Daughter at the bedside.  He does have some cough and throat discomfort but without any fever wheezing or shortness of breath.  Eager to go home.   Discharge Exam: Vitals:   05/22/22 0100 05/22/22 0500  BP: 122/73 123/81  Pulse: 88 82  Resp:  20  Temp: 98.3 F (36.8 C) 98.5  F (36.9 C)  SpO2: 97% 99%   Vitals:   05/21/22 1649 05/21/22 2116 05/22/22 0100 05/22/22 0500  BP: (!) 141/75 122/67 122/73 123/81  Pulse: 99 87 88 82  Resp: _0 Temp: 98.4 F (36.9 C) 98 F (36.7 C) 98.3 F (36.8 C) 98.5 F (36.9 C)  TempSrc: Oral Oral Oral   SpO2: 97% 99% 97% 99%  Weight:    83.9 kg    General: Pt is alert, awake, not in acute distress Walking around in the hallway. Cardiovascular: RRR, S1/S2 +, no rubs, no gallops Respiratory: CTA bilaterally, no wheezing, no rhonchi, occasional upper airway sounds. Abdominal: Soft, NT, ND, bowel sounds + Extremities: no edema, no cyanosis    The results of significant diagnostics from this hospitalization (including imaging, microbiology, ancillary and laboratory) are listed below for reference.     Microbiology: No results found for this or any previous visit (from the past 240 hour(s)).   Labs: BNP (last 3 results) Recent Labs    03/15/22 0952 03/16/22 0914 05/21/22 0419  BNP  170.1* 380.3* 7,334.4*   Basic Metabolic Panel: Recent Labs  Lab 05/20/22 1730 05/21/22 0419  NA 136 140  K 3.7 3.9  CL 96* 101  CO2 31 31  GLUCOSE 132* 162*  BUN 39* 36*  CREATININE 1.92* 1.49*  CALCIUM 9.6 9.4  MG  --  1.4*   Liver Function Tests: Recent Labs  Lab 05/20/22 1730 05/21/22 0419  AST 37 43*  ALT 24 27  ALKPHOS 90 83  BILITOT 0.7 0.6  PROT 7.8 7.0  ALBUMIN 3.6 3.3*   No results for input(s): "LIPASE", "AMYLASE" in the last 168 hours. No results for input(s): "AMMONIA" in the last 168 hours. CBC: Recent Labs  Lab 05/20/22 1730 05/21/22 0419  WBC 6.0 3.9*  NEUTROABS 4.8 3.6  HGB 11.0* 10.5*  HCT 34.5* 32.6*  MCV 86.9 87.4  PLT 176 165   Cardiac Enzymes: No results for input(s): "CKTOTAL", "CKMB", "CKMBINDEX", "TROPONINI" in the last 168 hours. BNP: Invalid input(s): "POCBNP" CBG: No results for input(s): "GLUCAP" in the last 168 hours. D-Dimer No results for input(s): "DDIMER" in the last 72 hours. Hgb A1c No results for input(s): "HGBA1C" in the last 72 hours. Lipid Profile No results for input(s): "CHOL", "HDL", "LDLCALC", "TRIG", "CHOLHDL", "LDLDIRECT" in the last 72 hours. Thyroid function studies No results for input(s): "TSH", "T4TOTAL", "T3FREE", "THYROIDAB" in the last 72 hours.  Invalid input(s): "FREET3" Anemia work up Recent Labs    05/21/22 0419  FERRITIN 36   Urinalysis    Component Value Date/Time   COLORURINE YELLOW 03/16/2022 1530   APPEARANCEUR HAZY (A) 03/16/2022 1530   LABSPEC 1.013 03/16/2022 1530   PHURINE 5.0 03/16/2022 1530   GLUCOSEU NEGATIVE 03/16/2022 1530   HGBUR LARGE (A) 03/16/2022 1530   BILIRUBINUR NEGATIVE 03/16/2022 1530   KETONESUR NEGATIVE 03/16/2022 1530   PROTEINUR 30 (A) 03/16/2022 1530   UROBILINOGEN 0.2 09/08/2014 0927   NITRITE NEGATIVE 03/16/2022 1530   LEUKOCYTESUR NEGATIVE 03/16/2022 1530   Sepsis Labs Recent Labs  Lab 05/20/22 1730 05/21/22 0419  WBC 6.0 3.9*    Microbiology No results found for this or any previous visit (from the past 240 hour(s)).   Time coordinating discharge: 35 minutes  SIGNED:   Barb Merino, MD  [facility] 05/22/2022, 9:14 AM

## 2022-06-05 DIAGNOSIS — Z23 Encounter for immunization: Secondary | ICD-10-CM | POA: Diagnosis not present

## 2022-06-05 DIAGNOSIS — Z09 Encounter for follow-up examination after completed treatment for conditions other than malignant neoplasm: Secondary | ICD-10-CM | POA: Diagnosis not present

## 2022-06-05 DIAGNOSIS — U071 COVID-19: Secondary | ICD-10-CM | POA: Diagnosis not present

## 2022-06-13 ENCOUNTER — Other Ambulatory Visit (HOSPITAL_COMMUNITY): Payer: Self-pay | Admitting: *Deleted

## 2022-06-13 MED ORDER — MIDODRINE HCL 5 MG PO TABS: 15.0000 mg | ORAL_TABLET | Freq: Three times a day (TID) | ORAL | 3 refills | Status: DC

## 2022-06-17 ENCOUNTER — Other Ambulatory Visit (HOSPITAL_COMMUNITY): Payer: Self-pay | Admitting: *Deleted

## 2022-06-17 ENCOUNTER — Other Ambulatory Visit (HOSPITAL_COMMUNITY): Payer: Self-pay | Admitting: Cardiology

## 2022-06-17 DIAGNOSIS — Z23 Encounter for immunization: Secondary | ICD-10-CM | POA: Diagnosis not present

## 2022-06-20 ENCOUNTER — Other Ambulatory Visit (HOSPITAL_COMMUNITY): Payer: Self-pay | Admitting: *Deleted

## 2022-06-20 MED ORDER — MIDODRINE HCL 5 MG PO TABS: 15.0000 mg | ORAL_TABLET | Freq: Three times a day (TID) | ORAL | 3 refills | Status: DC

## 2022-06-23 ENCOUNTER — Other Ambulatory Visit (HOSPITAL_COMMUNITY): Payer: Self-pay | Admitting: Cardiology

## 2022-06-28 ENCOUNTER — Other Ambulatory Visit (HOSPITAL_COMMUNITY): Payer: Self-pay

## 2022-06-28 ENCOUNTER — Telehealth (HOSPITAL_COMMUNITY): Payer: Self-pay

## 2022-06-28 ENCOUNTER — Encounter (HOSPITAL_COMMUNITY): Payer: Self-pay | Admitting: Cardiology

## 2022-06-28 ENCOUNTER — Ambulatory Visit (HOSPITAL_COMMUNITY)
Admission: RE | Admit: 2022-06-28 | Discharge: 2022-06-28 | Disposition: A | Discharge status: Home / Self Care | Payer: Medicare Other | Source: Ambulatory Visit | Attending: Cardiology | Admitting: Cardiology

## 2022-06-28 VITALS — BP 90/50 | HR 75 | Wt 181.8 lb

## 2022-06-28 DIAGNOSIS — N183 Chronic kidney disease, stage 3 unspecified: Secondary | ICD-10-CM | POA: Diagnosis not present

## 2022-06-28 DIAGNOSIS — I2782 Chronic pulmonary embolism: Secondary | ICD-10-CM | POA: Insufficient documentation

## 2022-06-28 DIAGNOSIS — G128 Other spinal muscular atrophies and related syndromes: Secondary | ICD-10-CM | POA: Insufficient documentation

## 2022-06-28 DIAGNOSIS — Z7901 Long term (current) use of anticoagulants: Secondary | ICD-10-CM | POA: Diagnosis not present

## 2022-06-28 DIAGNOSIS — I130 Hypertensive heart and chronic kidney disease with heart failure and stage 1 through stage 4 chronic kidney disease, or unspecified chronic kidney disease: Secondary | ICD-10-CM | POA: Diagnosis not present

## 2022-06-28 DIAGNOSIS — I5032 Chronic diastolic (congestive) heart failure: Secondary | ICD-10-CM | POA: Insufficient documentation

## 2022-06-28 DIAGNOSIS — I2510 Atherosclerotic heart disease of native coronary artery without angina pectoris: Secondary | ICD-10-CM | POA: Diagnosis not present

## 2022-06-28 DIAGNOSIS — G4733 Obstructive sleep apnea (adult) (pediatric): Secondary | ICD-10-CM | POA: Insufficient documentation

## 2022-06-28 DIAGNOSIS — Z79899 Other long term (current) drug therapy: Secondary | ICD-10-CM | POA: Diagnosis not present

## 2022-06-28 DIAGNOSIS — K2100 Gastro-esophageal reflux disease with esophagitis, without bleeding: Secondary | ICD-10-CM | POA: Diagnosis not present

## 2022-06-28 DIAGNOSIS — J439 Emphysema, unspecified: Secondary | ICD-10-CM | POA: Diagnosis not present

## 2022-06-28 DIAGNOSIS — K449 Diaphragmatic hernia without obstruction or gangrene: Secondary | ICD-10-CM | POA: Diagnosis not present

## 2022-06-28 DIAGNOSIS — I272 Pulmonary hypertension, unspecified: Secondary | ICD-10-CM | POA: Diagnosis not present

## 2022-06-28 LAB — COMPREHENSIVE METABOLIC PANEL
ALT: 18 U/L (ref 0–44)
AST: 25 U/L (ref 15–41)
Albumin: 3.5 g/dL (ref 3.5–5.0)
Alkaline Phosphatase: 86 U/L (ref 38–126)
Anion gap: 8 (ref 5–15)
BUN: 26 mg/dL — ABNORMAL HIGH (ref 8–23)
CO2: 32 mmol/L (ref 22–32)
Calcium: 9.8 mg/dL (ref 8.9–10.3)
Chloride: 99 mmol/L (ref 98–111)
Creatinine, Ser: 1.69 mg/dL — ABNORMAL HIGH (ref 0.61–1.24)
GFR, Estimated: 40 mL/min — ABNORMAL LOW (ref >60)
Glucose, Bld: 109 mg/dL — ABNORMAL HIGH (ref 70–99)
Potassium: 3.8 mmol/L (ref 3.5–5.1)
Sodium: 139 mmol/L (ref 135–145)
Total Bilirubin: 0.4 mg/dL (ref 0.3–1.2)
Total Protein: 7 g/dL (ref 6.5–8.1)

## 2022-06-28 LAB — CBC
HCT: 34.9 % — ABNORMAL LOW (ref 39.0–52.0)
Hemoglobin: 11.2 g/dL — ABNORMAL LOW (ref 13.0–17.0)
MCH: 27.9 pg (ref 26.0–34.0)
MCHC: 32.1 g/dL (ref 30.0–36.0)
MCV: 87 fL (ref 80.0–100.0)
Platelets: 205 10*3/uL (ref 150–400)
RBC: 4.01 MIL/uL — ABNORMAL LOW (ref 4.22–5.81)
RDW: 15.9 % — ABNORMAL HIGH (ref 11.5–15.5)
WBC: 5 10*3/uL (ref 4.0–10.5)
nRBC: 0 % (ref 0.0–0.2)

## 2022-06-28 LAB — BRAIN NATRIURETIC PEPTIDE: B Natriuretic Peptide: 124 pg/mL — ABNORMAL HIGH (ref 0.0–100.0)

## 2022-06-28 MED ORDER — DAPAGLIFLOZIN PROPANEDIOL 10 MG PO TABS: 10.0000 mg | ORAL_TABLET | Freq: Every day | ORAL | 3 refills | Status: DC

## 2022-06-28 NOTE — Patient Instructions (Signed)
RESTART Farxiga 25m daily.  Labs done today, your results will be available in MyChart, we will contact you for abnormal readings.  Repeat blood work in 10 days.  Your physician recommends that you schedule a follow-up appointment in: 2 months  If you have any questions or concerns before your next appointment please send u[place]aa message through [email or web address] call our office at [phone]    TO LEAVE A MESSAGE FOR THE NURSE SELECT OPTION 2, PLEASE LEAVE A MESSAGE INCLUDING: YOUR NAME DATE OF BIRTH CALL BACK NUMBER REASON FOR CALL**this is important as we prioritize the call backs  YOU WILL RECEIVE A CALL BACK THE SAME DAY AS LONG AS YOU CALL BEFORE 4:00 PM  At [facility] you and your health needs are our priority. As part of our continuing mission to provide you with exceptional heart care, we have created designated Provider Care Teams. These Care Teams include your primary Cardiologist (physician) and Advanced Practice Providers (APPs- Physician Assistants and Nurse Practitioners) who all work together to provide you with the care you need, when you need it.   You may see any of the following providers on your designated Care Team at your next follow up: Dr DGlori BickersDr DLoralie ChampagneDr. ARoxana Hires NP BLyda Jester PUtahJSaint Barnabas Medical CenterLHoward [place] PUtahAForestine Na NP LAudry Riles PharmD   Please be sure to bring in all your medications bottles to every appointment.

## 2022-06-28 NOTE — Telephone Encounter (Signed)
Advanced Heart Failure Patient Advocate Encounter   Prior authorization is required for Farxiga 10MG. PA submitted and APPROVED on 06/28/2022.  Key [id number]  Effective: 05/29/2022 - 06/28/2023  Clista Bernhardt, CPhT Rx Patient Advocate Phone: [phone]

## 2022-06-28 NOTE — Progress Notes (Signed)
ReDS Vest / Clip - 06/28/22 1000       ReDS Vest / Clip   Station Marker C    Ruler Value 30    ReDS Value Range Low volume    ReDS Actual Value 22

## 2022-06-30 NOTE — Progress Notes (Signed)
PCP: [name], DO Cardiology: Dr. Burt Knack HF Cardiology: Dr. Aundra Dubin  86 y.o. with history of HFPEF, prior PE, and pulmonary hypertension was referred by Dr. Gasper Sells for evaluation of pulmonary hypertension.  Patient had a PE in 2016.  He is on apixaban. He has a long history of diastolic CHF.  Most recent echo showed a significant component of RV failure with EF 55-60%, IV septum flattened, severe RV enlargement, severely decreased RV function, PASP 57 mmHg.  CTA chest in 4/22 showed no acute PE and mild emphysema, but V/Q scan in 5/22 was suggestive of chronic PE in the right middle lobe.  RHC in 4/22 showed normal filling pressures with moderate PAH. He additionally has chronic hiccups followed by Dr. Benson Norway, now on gabapentin. PFTs in 6/22 showed mixed picture with severe obstruction, moderate restriction, and severely decreased DLCO.   Admitted 8/25-8/30/22 with A/C HF exacerbation. He was aggressively diuresed with lasix/metolazone, had unna boots, farxiga added.  Midodrine was increased for hypotension. Hospitalization c/b AKI on CKD III.  Readmitted 9/1-9/12/22 with shock (mixed septic and cardiogenic), likely 2/2 to aspiration pneumonia and A/C CHF.  Suspect possible gut translocation with ileus/partial SBO; CT abdomen with "nutcracker phenomenon".  He was started on NE + antibiotics. General surgery consulted and felt not to be a surgical candidate. Diuretics initially held due to over-diuresis and shock. Eventually able to wean pressors off, restart PO torsemide and midodrine. Hospitalization c/b transaminitis, delirium, and BPH with urinary retention, requiring foley catheter. Palliative care was consulted for [place] and patient DNR/DNI. PT/OT recommended HH. He was discharged home, weight 220 lbs.  EGD 11/22 showed esophagitis, hiatal hernia and yeast.   Acute visit 07/17/21 for weakness/falls. He had a syncopal episode in clinic. Labs showed hypokalemia of 3.3, SCr elevated at 2.13,  ReDs 27%. [place] stopped, torsemide decreased to 40 mg daily and compression hose and abdominal binder ordered. 3-day Zio placed to evaluate for possible arrhythmogenic cause of symptoms.  Zio monitor showed 19 second run of VT.   Follow up 1/23, rare atypical chest pain. NYHA III, weight continued to trend down.  Admitted 2/23 with PNA and a/c CHF. Seen by palliative care and remained full code.  Admitted 8/23 with septic shock due to aspiration PNA & possible colitis. Echo showed EF 60-65%, RV severely reduced, severely enlarged, RVSP 50, LVEF 60-65%, no RWMA, mild TR, trivial MR. AHF consulted for elevated Hs trop. He underwent R/LHC showing nonobstructive mild CAD, normal/low filling pressures and mild PAH. Troponin elevation likely due to demand ischemia. Hospitalization c/b AKI on CKD. He was restarted on Farxiga and lower dose torsemide (10 mg daily). He was discharged home, weight 175 lbs.   He was admitted again in 10/23 with COVID-19 infection.   He returns today for followup of CHF with his wife.  He has been doing fairly well recently, no dyspnea walking on flat ground.  He is short of breath walking up hills and stairs.  Occasional atypical chest pain.  He is now on midodrine and denies lightheadedness.  BP 90/50 today.  He has finished pulmonary rehab but would like to see if he could do it again. He remains on home oxygen 2 L.    Labs (4/22): K 4.8, creatinine 1.67 Labs (6/22): K 3.8, creatinine 1.72, ANA negative, RF 39.6 but CCP negative, SCL-70 negative, BNP 1231, HIV negative Labs (7/22): K 3.8, creatine 1.67 Labs (8/22): K 3.6, creatinine 1.6, hgb 15.3  Labs (9/22): K 5.0, creatinine 1.2, hgb 14.6  Labs (10/22): K 3.4, creatinine 1.37 Labs (10/22): K 3.5, creatinine 1.34 Labs (12/22): K 4.5, creatinine 1.81 Labs (2/23): K 3.8, creatinine 1.20 Labs (5/23): K 4.3, creatinine 1.5, BNP 84 Labs (8/23): K 4.5, creatinine 1.00 Labs (10/23): K 3.9, creatinine 1.49, AST 43, ALT  normal, BNP 1164  REDS clip 22%  6 minute walk (7/22): 213 m 6 minute walk (10/22): 274 m (oxygen saturation dropped to 70s on 2L [place])  PMH: 1. HFPEF: With prominent RV failure.   - Echo (2/22): EF 55-60%, IV septum flattened, severe RV enlargement, severely decreased RV function, PASP 57 mmHg.  - Echo (8/23): EF 60-65%, RV severely reduced, severely enlarged, RVSP 50, LVEF 60-65%, no RWMA, mild TR, trivial MR.  2. Venous thromboembolic disease: PE in 1696.   - Venous dopplers (4/22): No DVT.  - CTA chest (4/22): No PE.  - V/Q scan 5/22 with perfusion defect in the RML consistent with chronic PE.  3. OSA: Waiting on CPAP.  4. HTN 5. COPD: Prior smoker.   - CTA chest in 4/22 showed no PE, mild emphysema.  - PFTs (6/22) with severe obstruction, moderate restriction, severely decreased DLCO 6. Pulmonary hypertension: RHC (4/22) with mean RA 5, PA 65/19 mean 36, mean PCWP 5, CI 2.19, PVR 6.1 WU, PAPi 9.2.  - RHC (8/23) with mean RA 1, PA 46/10 mean 25, mean PCWP 4, CI 2.98, PVR 3.4 WU 7. Chronic hiccups 8. BPH: Has to in and out cath at times. 9. Syncope: Zio 12/22 with 19 second VT run.  10. Chronic hiccups 11. 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 12. CAD: LHC (8/23) with nonobstructive CAD.   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 Stopped working in  2022  Tobacco Use   Smoking status: Former    Packs/day: 1.00    Years: 50.00    Total pack years: 50.00    Types: Cigarettes    Start date: 74    Quit date: 08/19/2006    Years since quitting: 15.8   Smokeless tobacco: Never   Tobacco comments:    Former smoke 03/15/22  Vaping Use   Vaping Use: Never used   Substance and Sexual Activity   Alcohol use: Not Currently    Alcohol/week: 6.0 standard drinks of alcohol    Types: 6 Shots of liquor per week   Drug use: No   Sexual activity: Not on file  Other Topics Concern   Not on file  Social History Narrative   Not on file   Social Determinants of Health   Financial Resource Strain: Not on file  Food Insecurity: No Food Insecurity (05/20/2022)   Hunger Vital Sign    Worried About Running Out of Food in the Last Year: Never true    Ran Out of Food in the Last Year: Never true  Transportation Needs: No Transportation Needs (05/20/2022)   PRAPARE - Hydrologist (Medical): No    Lack of Transportation (Non-Medical): No  Physical Activity: Not on file  Stress: No Stress Concern Present (05/09/2021)   [place]    Feeling of Stress :  Only a little  Social Connections: Not on file  Intimate Partner Violence: Not At Risk (05/20/2022)   Humiliation, Afraid, Rape, and Kick questionnaire    Fear of Current or Ex-Partner: No    Emotionally Abused: No    Physically Abused: No    Sexually Abused: No   Family History  Problem Relation Age of Onset   Heart attack Brother 42   ROS: All systems reviewed and negative except as per HPI.   Current Outpatient Medications  Medication Sig Dispense Refill   albuterol (VENTOLIN HFA) 108 (90 Base) MCG/ACT inhaler Inhale 1 puff into the lungs every 4 (four) hours as needed for shortness of breath.     allopurinol (ZYLOPRIM) 100 MG tablet Take 100 mg by mouth every morning.     atorvastatin (LIPITOR) 40 MG tablet Take 1 tablet (40 mg total) by mouth daily. 30 tablet 6   Azelastine HCl 0.15 % SOLN Place 2 sprays into the nose daily as needed (allergies).     benzonatate (TESSALON) 100 MG capsule Take 1 capsule (100 mg total) by mouth 3 (three) times daily as needed for cough. 20 capsule 0   Cinnamon 500 MG capsule  Take 500 mg by mouth every morning.     dapagliflozin propanediol (FARXIGA) 10 MG TABS tablet Take 1 tablet (10 mg total) by mouth daily before breakfast. 90 tablet 3   ELIQUIS 5 MG TABS tablet TAKE 1 TABLET BY MOUTH TWICE A DAY 60 tablet 11   finasteride (PROSCAR) 5 MG tablet Take 5 mg by mouth at bedtime.     gabapentin (NEURONTIN) 300 MG capsule Take 300 mg by mouth 3 (three) times daily.     guaiFENesin (ROBITUSSIN) 100 MG/5ML liquid Take 10 mLs by mouth every 6 (six) hours as needed for cough or to loosen phlegm. 120 mL 0   LINZESS 72 MCG capsule Take 72 mcg by mouth every morning.     magnesium oxide (MAG-OX) 400 (240 Mg) MG tablet TAKE 1 TABLET BY MOUTH EVERY DAY 90 tablet 1   metoCLOPramide (REGLAN) 5 MG tablet Take 5 mg by mouth 3 (three) times daily before meals.     midodrine (PROAMATINE) 5 MG tablet Take 3 tablets (15 mg total) by mouth 3 (three) times daily. 90 tablet 3   Multiple Vitamin (MULTIVITAMIN WITH MINERALS) TABS tablet Take 1 tablet by mouth daily. 30 tablet 0   OXYGEN Inhale 2 L into the lungs continuous.     pantoprazole (PROTONIX) 40 MG tablet Take 40 mg by mouth every morning.     Riociguat 2 MG TABS Take 2 mg by mouth in the morning, at noon, and at bedtime. 90 tablet 11   STIOLTO RESPIMAT 2.5-2.5 MCG/ACT AERS INHALE 2 PUFFS BY MOUTH INTO THE LUNGS DAILY 4 g 5   torsemide (DEMADEX) 20 MG tablet Take 1 tablet (20 mg total) by mouth daily. 30 tablet 6   No current facility-administered medications for this encounter.   Wt Readings from Last 3 Encounters:  06/28/22 82.5 kg (181 lb 12.8 oz)  05/22/22 83.9 kg (184 lb 15.5 oz)  05/20/22 81.6 kg (180 lb)   BP (!) 90/50   Pulse 75   Wt 82.5 kg (181 lb 12.8 oz)   SpO2 96% Comment: 2l n/c  BMI 22.72 kg/m  General: NAD Neck: No JVD but EJ dilated, no thyromegaly or thyroid nodule.  Lungs: Clear to auscultation bilaterally with normal respiratory effort. CV: Nondisplaced PMI.  Heart regular S1/S2, no S3/S4,  no murmur.   Trace ankle edema.  No carotid bruit.  Normal pedal pulses.  Abdomen: Soft, nontender, no hepatosplenomegaly, no distention.  Skin: Intact without lesions or rashes.  Neurologic: Alert and oriented x 3.  Psych: Normal affect. Extremities: No clubbing or cyanosis.  HEENT: Normal.   Assessment/Plan: 1. Chronic HFpEF/RV failure: Echo (2/22) with EF 55-60%, IV septum flattened, severe RV enlargement, severely decreased RV function, PASP 57 mmHg.  He has severe RV failure at baseline.  Echo this admit 8/23 >>RV severely reduced, severely enlarged, RVSP 50, LVEF 60-65%, no RWMA, mild TR, trivial MR.  RHC 8/23 showed normal/low filling pressures and mild PAH.  Chronically NYHA III, seems stable today. He is not volume overloaded today by exam or REDS clip. - Continue midodrine 15 mg tid. Would avoid fludrocortisone. - Restart dapagliflozin 10 mg daily.  BMET/BNP today, BMET 10 days.  - Continue torsemide 20 mg daily.  2. Pulmonary HTN: PAH noted on 4/22 RHC with PVR 6.1 WU.  This appears to be multifactorial with OSA, severe emphysema, and a suspected chronic PE involving the right middle lobe (group 3 and group 4 PH). Given the suspected mixed etiology with only 1 area of chronic thromboembolism (right middle lobe) as well as age, do not think that pulmonary thromboendarterectomy would be indicated.  Rheumatologic serologic workup was negative.  PFTs showed severe obstruction and moderate restriction, suggesting significant COPD. [place] 8/23 showed mild pulmonary hypertension with PVR 3.4 WU.  - Continue riociguat.  - Think I would hold off Tyvaso for now, suspect large group 3 PH component and only mildly elevated PA pressure on 8/23 RHC.  3. CKD III: BMET today. 4. CAD: nonobstructive on [facility] 8/23. - Continue statin.  5. OSA: Moderate OSA on sleep study. He refused CPAP. 6. Emphysema: Prior smoker.  Emphysema on CT and severe obstruction on PFTs. On 2L home oxygen.  7. Chronic PE: Diagnosed by V/Q  scan.  No abnormal bleeding. He has a probable chronic RLE DVT.   - Continue Eliquis.  8. Hiatial hernia w/ esophagitis: Has significant GERD. He follows with Dr. Benson Norway. - Continue PPI.  9. Nutcracker SMA: Compresses duodenum.   Follow up in 2 months with APP  Loralie Champagne 06/30/2022

## 2022-07-03 DIAGNOSIS — I10 Essential (primary) hypertension: Secondary | ICD-10-CM | POA: Diagnosis not present

## 2022-07-03 DIAGNOSIS — E118 Type 2 diabetes mellitus with unspecified complications: Secondary | ICD-10-CM | POA: Diagnosis not present

## 2022-07-03 DIAGNOSIS — E7800 Pure hypercholesterolemia, unspecified: Secondary | ICD-10-CM | POA: Diagnosis not present

## 2022-07-03 DIAGNOSIS — N1831 Chronic kidney disease, stage 3a: Secondary | ICD-10-CM | POA: Diagnosis not present

## 2022-07-03 DIAGNOSIS — M109 Gout, unspecified: Secondary | ICD-10-CM | POA: Diagnosis not present

## 2022-07-07 ENCOUNTER — Other Ambulatory Visit (HOSPITAL_COMMUNITY): Payer: Self-pay | Admitting: Cardiology

## 2022-07-08 ENCOUNTER — Ambulatory Visit (HOSPITAL_COMMUNITY)
Admission: RE | Admit: 2022-07-08 | Discharge: 2022-07-08 | Disposition: A | Discharge status: Home / Self Care | Payer: Medicare Other | Source: Ambulatory Visit | Attending: Internal Medicine | Admitting: Internal Medicine

## 2022-07-08 DIAGNOSIS — I5032 Chronic diastolic (congestive) heart failure: Secondary | ICD-10-CM | POA: Diagnosis not present

## 2022-07-08 LAB — BASIC METABOLIC PANEL
Anion gap: 9 (ref 5–15)
BUN: 19 mg/dL (ref 8–23)
CO2: 30 mmol/L (ref 22–32)
Calcium: 9.9 mg/dL (ref 8.9–10.3)
Chloride: 102 mmol/L (ref 98–111)
Creatinine, Ser: 1.4 mg/dL — ABNORMAL HIGH (ref 0.61–1.24)
GFR, Estimated: 50 mL/min — ABNORMAL LOW (ref >60)
Glucose, Bld: 107 mg/dL — ABNORMAL HIGH (ref 70–99)
Potassium: 3.9 mmol/L (ref 3.5–5.1)
Sodium: 141 mmol/L (ref 135–145)

## 2022-07-10 ENCOUNTER — Other Ambulatory Visit (HOSPITAL_COMMUNITY): Payer: Self-pay | Admitting: Cardiology

## 2022-07-10 DIAGNOSIS — I5032 Chronic diastolic (congestive) heart failure: Secondary | ICD-10-CM | POA: Diagnosis not present

## 2022-07-10 DIAGNOSIS — E118 Type 2 diabetes mellitus with unspecified complications: Secondary | ICD-10-CM | POA: Diagnosis not present

## 2022-07-10 DIAGNOSIS — M109 Gout, unspecified: Secondary | ICD-10-CM | POA: Diagnosis not present

## 2022-07-10 DIAGNOSIS — I272 Pulmonary hypertension, unspecified: Secondary | ICD-10-CM | POA: Diagnosis not present

## 2022-07-10 DIAGNOSIS — J449 Chronic obstructive pulmonary disease, unspecified: Secondary | ICD-10-CM | POA: Diagnosis not present

## 2022-07-10 DIAGNOSIS — N1831 Chronic kidney disease, stage 3a: Secondary | ICD-10-CM | POA: Diagnosis not present

## 2022-07-10 DIAGNOSIS — E7800 Pure hypercholesterolemia, unspecified: Secondary | ICD-10-CM | POA: Diagnosis not present

## 2022-07-10 DIAGNOSIS — K5900 Constipation, unspecified: Secondary | ICD-10-CM | POA: Diagnosis not present

## 2022-07-10 DIAGNOSIS — I10 Essential (primary) hypertension: Secondary | ICD-10-CM | POA: Diagnosis not present

## 2022-07-10 DIAGNOSIS — R066 Hiccough: Secondary | ICD-10-CM | POA: Diagnosis not present

## 2022-07-10 DIAGNOSIS — R748 Abnormal levels of other serum enzymes: Secondary | ICD-10-CM | POA: Diagnosis not present

## 2022-07-10 DIAGNOSIS — I2510 Atherosclerotic heart disease of native coronary artery without angina pectoris: Secondary | ICD-10-CM | POA: Diagnosis not present

## 2022-07-16 ENCOUNTER — Emergency Department (HOSPITAL_COMMUNITY): Payer: Medicare Other

## 2022-07-16 ENCOUNTER — Other Ambulatory Visit: Payer: Self-pay

## 2022-07-16 ENCOUNTER — Inpatient Hospital Stay (HOSPITAL_COMMUNITY)
Admission: EM | Admit: 2022-07-16 | Discharge: 2022-07-19 | DRG: 871 | Disposition: A | Discharge status: Home Health | Payer: Medicare Other | Attending: Family Medicine | Admitting: Family Medicine

## 2022-07-16 DIAGNOSIS — E1122 Type 2 diabetes mellitus with diabetic chronic kidney disease: Secondary | ICD-10-CM | POA: Diagnosis present

## 2022-07-16 DIAGNOSIS — Z23 Encounter for immunization: Secondary | ICD-10-CM

## 2022-07-16 DIAGNOSIS — Z8249 Family history of ischemic heart disease and other diseases of the circulatory system: Secondary | ICD-10-CM

## 2022-07-16 DIAGNOSIS — I2510 Atherosclerotic heart disease of native coronary artery without angina pectoris: Secondary | ICD-10-CM | POA: Diagnosis present

## 2022-07-16 DIAGNOSIS — R918 Other nonspecific abnormal finding of lung field: Secondary | ICD-10-CM | POA: Diagnosis not present

## 2022-07-16 DIAGNOSIS — Z87891 Personal history of nicotine dependence: Secondary | ICD-10-CM

## 2022-07-16 DIAGNOSIS — W19XXXA Unspecified fall, initial encounter: Secondary | ICD-10-CM | POA: Diagnosis not present

## 2022-07-16 DIAGNOSIS — J440 Chronic obstructive pulmonary disease with acute lower respiratory infection: Secondary | ICD-10-CM | POA: Diagnosis not present

## 2022-07-16 DIAGNOSIS — Z8616 Personal history of COVID-19: Secondary | ICD-10-CM | POA: Diagnosis not present

## 2022-07-16 DIAGNOSIS — J439 Emphysema, unspecified: Secondary | ICD-10-CM | POA: Diagnosis not present

## 2022-07-16 DIAGNOSIS — R6520 Severe sepsis without septic shock: Secondary | ICD-10-CM | POA: Diagnosis not present

## 2022-07-16 DIAGNOSIS — I700 Atherosclerosis of aorta: Secondary | ICD-10-CM | POA: Diagnosis not present

## 2022-07-16 DIAGNOSIS — I2782 Chronic pulmonary embolism: Secondary | ICD-10-CM | POA: Diagnosis not present

## 2022-07-16 DIAGNOSIS — R6521 Severe sepsis with septic shock: Secondary | ICD-10-CM | POA: Diagnosis not present

## 2022-07-16 DIAGNOSIS — I5032 Chronic diastolic (congestive) heart failure: Secondary | ICD-10-CM | POA: Diagnosis not present

## 2022-07-16 DIAGNOSIS — J189 Pneumonia, unspecified organism: Secondary | ICD-10-CM | POA: Diagnosis present

## 2022-07-16 DIAGNOSIS — A419 Sepsis, unspecified organism: Secondary | ICD-10-CM | POA: Diagnosis not present

## 2022-07-16 DIAGNOSIS — R338 Other retention of urine: Secondary | ICD-10-CM | POA: Diagnosis not present

## 2022-07-16 DIAGNOSIS — N179 Acute kidney failure, unspecified: Secondary | ICD-10-CM | POA: Diagnosis present

## 2022-07-16 DIAGNOSIS — J9611 Chronic respiratory failure with hypoxia: Secondary | ICD-10-CM

## 2022-07-16 DIAGNOSIS — E876 Hypokalemia: Secondary | ICD-10-CM | POA: Diagnosis present

## 2022-07-16 DIAGNOSIS — I50810 Right heart failure, unspecified: Secondary | ICD-10-CM | POA: Diagnosis not present

## 2022-07-16 DIAGNOSIS — Z882 Allergy status to sulfonamides status: Secondary | ICD-10-CM

## 2022-07-16 DIAGNOSIS — R579 Shock, unspecified: Secondary | ICD-10-CM | POA: Diagnosis not present

## 2022-07-16 DIAGNOSIS — J9622 Acute and chronic respiratory failure with hypercapnia: Secondary | ICD-10-CM | POA: Diagnosis not present

## 2022-07-16 DIAGNOSIS — I2489 Other forms of acute ischemic heart disease: Secondary | ICD-10-CM | POA: Diagnosis present

## 2022-07-16 DIAGNOSIS — I82501 Chronic embolism and thrombosis of unspecified deep veins of right lower extremity: Secondary | ICD-10-CM | POA: Diagnosis present

## 2022-07-16 DIAGNOSIS — Z9102 Food additives allergy status: Secondary | ICD-10-CM

## 2022-07-16 DIAGNOSIS — I517 Cardiomegaly: Secondary | ICD-10-CM | POA: Diagnosis not present

## 2022-07-16 DIAGNOSIS — G4733 Obstructive sleep apnea (adult) (pediatric): Secondary | ICD-10-CM | POA: Diagnosis present

## 2022-07-16 DIAGNOSIS — Z7984 Long term (current) use of oral hypoglycemic drugs: Secondary | ICD-10-CM

## 2022-07-16 DIAGNOSIS — Z66 Do not resuscitate: Secondary | ICD-10-CM | POA: Diagnosis present

## 2022-07-16 DIAGNOSIS — I272 Pulmonary hypertension, unspecified: Secondary | ICD-10-CM

## 2022-07-16 DIAGNOSIS — J9621 Acute and chronic respiratory failure with hypoxia: Secondary | ICD-10-CM | POA: Diagnosis present

## 2022-07-16 DIAGNOSIS — F32A Depression, unspecified: Secondary | ICD-10-CM | POA: Diagnosis not present

## 2022-07-16 DIAGNOSIS — Z91048 Other nonmedicinal substance allergy status: Secondary | ICD-10-CM

## 2022-07-16 DIAGNOSIS — Z7901 Long term (current) use of anticoagulants: Secondary | ICD-10-CM

## 2022-07-16 DIAGNOSIS — Z7951 Long term (current) use of inhaled steroids: Secondary | ICD-10-CM

## 2022-07-16 DIAGNOSIS — R001 Bradycardia, unspecified: Secondary | ICD-10-CM | POA: Diagnosis not present

## 2022-07-16 DIAGNOSIS — D649 Anemia, unspecified: Secondary | ICD-10-CM | POA: Diagnosis not present

## 2022-07-16 DIAGNOSIS — I2724 Chronic thromboembolic pulmonary hypertension: Secondary | ICD-10-CM | POA: Diagnosis present

## 2022-07-16 DIAGNOSIS — I130 Hypertensive heart and chronic kidney disease with heart failure and stage 1 through stage 4 chronic kidney disease, or unspecified chronic kidney disease: Secondary | ICD-10-CM | POA: Diagnosis present

## 2022-07-16 DIAGNOSIS — J9601 Acute respiratory failure with hypoxia: Secondary | ICD-10-CM | POA: Diagnosis not present

## 2022-07-16 DIAGNOSIS — R000 Tachycardia, unspecified: Secondary | ICD-10-CM | POA: Diagnosis not present

## 2022-07-16 DIAGNOSIS — N183 Chronic kidney disease, stage 3 unspecified: Secondary | ICD-10-CM | POA: Diagnosis present

## 2022-07-16 DIAGNOSIS — I213 ST elevation (STEMI) myocardial infarction of unspecified site: Secondary | ICD-10-CM | POA: Diagnosis not present

## 2022-07-16 DIAGNOSIS — K2100 Gastro-esophageal reflux disease with esophagitis, without bleeding: Secondary | ICD-10-CM | POA: Diagnosis present

## 2022-07-16 DIAGNOSIS — Z96652 Presence of left artificial knee joint: Secondary | ICD-10-CM | POA: Diagnosis present

## 2022-07-16 DIAGNOSIS — R7989 Other specified abnormal findings of blood chemistry: Secondary | ICD-10-CM | POA: Diagnosis not present

## 2022-07-16 DIAGNOSIS — Z79899 Other long term (current) drug therapy: Secondary | ICD-10-CM

## 2022-07-16 DIAGNOSIS — Z1152 Encounter for screening for COVID-19: Secondary | ICD-10-CM

## 2022-07-16 DIAGNOSIS — D631 Anemia in chronic kidney disease: Secondary | ICD-10-CM | POA: Diagnosis present

## 2022-07-16 DIAGNOSIS — Z8701 Personal history of pneumonia (recurrent): Secondary | ICD-10-CM

## 2022-07-16 DIAGNOSIS — N401 Enlarged prostate with lower urinary tract symptoms: Secondary | ICD-10-CM | POA: Diagnosis present

## 2022-07-16 DIAGNOSIS — I252 Old myocardial infarction: Secondary | ICD-10-CM

## 2022-07-16 DIAGNOSIS — I4510 Unspecified right bundle-branch block: Secondary | ICD-10-CM | POA: Diagnosis present

## 2022-07-16 DIAGNOSIS — Z91199 Patient's noncompliance with other medical treatment and regimen due to unspecified reason: Secondary | ICD-10-CM

## 2022-07-16 LAB — CBC WITH DIFFERENTIAL/PLATELET
Abs Immature Granulocytes: 0.04 10*3/uL (ref 0.00–0.07)
Basophils Absolute: 0 10*3/uL (ref 0.0–0.1)
Basophils Relative: 0 %
Eosinophils Absolute: 0 10*3/uL (ref 0.0–0.5)
Eosinophils Relative: 0 %
HCT: 35.6 % — ABNORMAL LOW (ref 39.0–52.0)
Hemoglobin: 11.5 g/dL — ABNORMAL LOW (ref 13.0–17.0)
Immature Granulocytes: 0 %
Lymphocytes Relative: 10 %
Lymphs Abs: 1 10*3/uL (ref 0.7–4.0)
MCH: 28.1 pg (ref 26.0–34.0)
MCHC: 32.3 g/dL (ref 30.0–36.0)
MCV: 87 fL (ref 80.0–100.0)
Monocytes Absolute: 0.9 10*3/uL (ref 0.1–1.0)
Monocytes Relative: 9 %
Neutro Abs: 8.5 10*3/uL — ABNORMAL HIGH (ref 1.7–7.7)
Neutrophils Relative %: 81 %
Platelets: 203 10*3/uL (ref 150–400)
RBC: 4.09 MIL/uL — ABNORMAL LOW (ref 4.22–5.81)
RDW: 16 % — ABNORMAL HIGH (ref 11.5–15.5)
WBC: 10.6 10*3/uL — ABNORMAL HIGH (ref 4.0–10.5)
nRBC: 0 % (ref 0.0–0.2)

## 2022-07-16 LAB — I-STAT ARTERIAL BLOOD GAS, ED
Acid-Base Excess: 7 mmol/L — ABNORMAL HIGH (ref 0.0–2.0)
Bicarbonate: 33 mmol/L — ABNORMAL HIGH (ref 20.0–28.0)
Calcium, Ion: 1.29 mmol/L (ref 1.15–1.40)
HCT: 35 % — ABNORMAL LOW (ref 39.0–52.0)
Hemoglobin: 11.9 g/dL — ABNORMAL LOW (ref 13.0–17.0)
O2 Saturation: 87 %
Patient temperature: 102.4
Potassium: 3.6 mmol/L (ref 3.5–5.1)
Sodium: 139 mmol/L (ref 135–145)
TCO2: 35 mmol/L — ABNORMAL HIGH (ref 22–32)
pCO2 arterial: 59.2 mmHg — ABNORMAL HIGH (ref 32–48)
pH, Arterial: 7.364 (ref 7.35–7.45)
pO2, Arterial: 63 mmHg — ABNORMAL LOW (ref 83–108)

## 2022-07-16 LAB — COMPREHENSIVE METABOLIC PANEL
ALT: 25 U/L (ref 0–44)
AST: 38 U/L (ref 15–41)
Albumin: 3.5 g/dL (ref 3.5–5.0)
Alkaline Phosphatase: 98 U/L (ref 38–126)
Anion gap: 14 (ref 5–15)
BUN: 27 mg/dL — ABNORMAL HIGH (ref 8–23)
CO2: 28 mmol/L (ref 22–32)
Calcium: 9.6 mg/dL (ref 8.9–10.3)
Chloride: 94 mmol/L — ABNORMAL LOW (ref 98–111)
Creatinine, Ser: 2.19 mg/dL — ABNORMAL HIGH (ref 0.61–1.24)
GFR, Estimated: 29 mL/min — ABNORMAL LOW (ref >60)
Glucose, Bld: 145 mg/dL — ABNORMAL HIGH (ref 70–99)
Potassium: 4 mmol/L (ref 3.5–5.1)
Sodium: 136 mmol/L (ref 135–145)
Total Bilirubin: 0.9 mg/dL (ref 0.3–1.2)
Total Protein: 7.2 g/dL (ref 6.5–8.1)

## 2022-07-16 LAB — URINALYSIS, ROUTINE W REFLEX MICROSCOPIC
Bilirubin Urine: NEGATIVE
Glucose, UA: >=500 mg/dL — AB
Hgb urine dipstick: NEGATIVE
Ketones, ur: NEGATIVE mg/dL
Nitrite: NEGATIVE
Protein, ur: NEGATIVE mg/dL
Specific Gravity, Urine: 1.01 (ref 1.005–1.030)
pH: 5.5 (ref 5.0–8.0)

## 2022-07-16 LAB — RESP PANEL BY RT-PCR (FLU A&B, COVID) ARPGX2
Influenza A by PCR: NEGATIVE
Influenza B by PCR: NEGATIVE
SARS Coronavirus 2 by RT PCR: NEGATIVE

## 2022-07-16 LAB — BRAIN NATRIURETIC PEPTIDE: B Natriuretic Peptide: 742.6 pg/mL — ABNORMAL HIGH (ref 0.0–100.0)

## 2022-07-16 LAB — LACTIC ACID, PLASMA
Lactic Acid, Venous: 1.9 mmol/L (ref 0.5–1.9)
Lactic Acid, Venous: 1.9 mmol/L (ref 0.5–1.9)

## 2022-07-16 LAB — URINALYSIS, MICROSCOPIC (REFLEX)

## 2022-07-16 LAB — TROPONIN I (HIGH SENSITIVITY)
Troponin I (High Sensitivity): 146 ng/L (ref <18)
Troponin I (High Sensitivity): 230 ng/L (ref <18)
Troponin I (High Sensitivity): 220 ng/L (ref <18)
Troponin I (High Sensitivity): 223 ng/L (ref <18)

## 2022-07-16 LAB — GLUCOSE, CAPILLARY
Glucose-Capillary: 96 mg/dL (ref 70–99)
Glucose-Capillary: 107 mg/dL — ABNORMAL HIGH (ref 70–99)
Glucose-Capillary: 99 mg/dL (ref 70–99)
Glucose-Capillary: 135 mg/dL — ABNORMAL HIGH (ref 70–99)
Glucose-Capillary: 126 mg/dL — ABNORMAL HIGH (ref 70–99)

## 2022-07-16 LAB — MRSA NEXT GEN BY PCR, NASAL: MRSA by PCR Next Gen: NOT DETECTED

## 2022-07-16 LAB — APTT: aPTT: 33 seconds (ref 24–36)

## 2022-07-16 LAB — PROTIME-INR
INR: 1.9 — ABNORMAL HIGH (ref 0.8–1.2)
Prothrombin Time: 21.9 seconds — ABNORMAL HIGH (ref 11.4–15.2)

## 2022-07-16 MED ORDER — LACTATED RINGERS IV BOLUS (SEPSIS)
1000.0000 mL | Freq: Once | INTRAVENOUS | Status: AC
  Administered 2022-07-16: 1000 mL via INTRAVENOUS

## 2022-07-16 MED ORDER — VANCOMYCIN HCL 1750 MG/350ML IV SOLN
1750.0000 mg | Freq: Once | INTRAVENOUS | Status: AC
  Administered 2022-07-16: 1750 mg via INTRAVENOUS
  Filled 2022-07-16: qty 350

## 2022-07-16 MED ORDER — LACTATED RINGERS IV BOLUS (SEPSIS)
2000.0000 mL | Freq: Once | INTRAVENOUS | Status: AC
  Administered 2022-07-16: 2000 mL via INTRAVENOUS

## 2022-07-16 MED ORDER — CHLORHEXIDINE GLUCONATE CLOTH 2 % EX PADS
6.0000 | MEDICATED_PAD | Freq: Every day | CUTANEOUS | Status: DC
  Administered 2022-07-16 – 2022-07-19 (×4): 6 via TOPICAL

## 2022-07-16 MED ORDER — PANTOPRAZOLE SODIUM 40 MG PO TBEC
40.0000 mg | DELAYED_RELEASE_TABLET | Freq: Every morning | ORAL | Status: DC
  Administered 2022-07-16 – 2022-07-19 (×4): 40 mg via ORAL
  Filled 2022-07-16 (×4): qty 1

## 2022-07-16 MED ORDER — MIDODRINE HCL 5 MG PO TABS
15.0000 mg | ORAL_TABLET | Freq: Three times a day (TID) | ORAL | Status: DC
  Administered 2022-07-16 – 2022-07-19 (×10): 15 mg via ORAL
  Filled 2022-07-16 (×10): qty 3

## 2022-07-16 MED ORDER — SODIUM CHLORIDE 0.9 % IV SOLN: 250.0000 mL | INTRAVENOUS | Status: DC

## 2022-07-16 MED ORDER — SODIUM CHLORIDE 0.9 % IV SOLN
500.0000 mg | Freq: Once | INTRAVENOUS | Status: AC
  Administered 2022-07-16: 500 mg via INTRAVENOUS
  Filled 2022-07-16: qty 5

## 2022-07-16 MED ORDER — LACTATED RINGERS IV SOLN: INTRAVENOUS | Status: AC

## 2022-07-16 MED ORDER — POLYETHYLENE GLYCOL 3350 17 G PO PACK: 17.0000 g | PACK | Freq: Every day | ORAL | Status: DC | PRN

## 2022-07-16 MED ORDER — SODIUM CHLORIDE 0.9 % IV SOLN
1.0000 g | Freq: Once | INTRAVENOUS | Status: AC
  Administered 2022-07-16: 1 g via INTRAVENOUS
  Filled 2022-07-16: qty 10

## 2022-07-16 MED ORDER — INSULIN ASPART 100 UNIT/ML IJ SOLN
0.0000 [IU] | INTRAMUSCULAR | Status: DC
  Administered 2022-07-16 (×2): 1 [IU] via SUBCUTANEOUS

## 2022-07-16 MED ORDER — NOREPINEPHRINE 4 MG/250ML-% IV SOLN
2.0000 ug/min | INTRAVENOUS | Status: DC
  Administered 2022-07-17: 2 ug/min via INTRAVENOUS

## 2022-07-16 MED ORDER — LIP MEDEX EX OINT: TOPICAL_OINTMENT | CUTANEOUS | Status: DC | PRN

## 2022-07-16 MED ORDER — ATORVASTATIN CALCIUM 40 MG PO TABS
40.0000 mg | ORAL_TABLET | Freq: Every day | ORAL | Status: DC
  Administered 2022-07-16 – 2022-07-19 (×4): 40 mg via ORAL
  Filled 2022-07-16 (×4): qty 1

## 2022-07-16 MED ORDER — FINASTERIDE 5 MG PO TABS
5.0000 mg | ORAL_TABLET | Freq: Every day | ORAL | Status: DC
  Administered 2022-07-16 – 2022-07-18 (×3): 5 mg via ORAL
  Filled 2022-07-16 (×4): qty 1

## 2022-07-16 MED ORDER — ARFORMOTEROL TARTRATE 15 MCG/2ML IN NEBU
15.0000 ug | INHALATION_SOLUTION | Freq: Two times a day (BID) | RESPIRATORY_TRACT | Status: DC
  Administered 2022-07-16 – 2022-07-19 (×5): 15 ug via RESPIRATORY_TRACT
  Filled 2022-07-16 (×7): qty 2

## 2022-07-16 MED ORDER — RIOCIGUAT 2 MG PO TABS
2.0000 mg | ORAL_TABLET | ORAL | Status: DC
  Administered 2022-07-16 – 2022-07-19 (×10): 2 mg via ORAL
  Filled 2022-07-16 (×12): qty 1

## 2022-07-16 MED ORDER — SODIUM CHLORIDE 0.9 % IV SOLN
2.0000 g | Freq: Once | INTRAVENOUS | Status: AC
  Administered 2022-07-16: 2 g via INTRAVENOUS
  Filled 2022-07-16: qty 12.5

## 2022-07-16 MED ORDER — LEVOFLOXACIN IN D5W 750 MG/150ML IV SOLN
750.0000 mg | Freq: Once | INTRAVENOUS | Status: DC
  Filled 2022-07-16: qty 150

## 2022-07-16 MED ORDER — DOCUSATE SODIUM 100 MG PO CAPS: 100.0000 mg | ORAL_CAPSULE | Freq: Two times a day (BID) | ORAL | Status: DC | PRN

## 2022-07-16 MED ORDER — VANCOMYCIN VARIABLE DOSE PER UNSTABLE RENAL FUNCTION (PHARMACIST DOSING): Status: DC

## 2022-07-16 MED ORDER — ORAL CARE MOUTH RINSE: 15.0000 mL | OROMUCOSAL | Status: DC | PRN

## 2022-07-16 MED ORDER — SODIUM CHLORIDE 0.9 % IV SOLN
2.0000 g | INTRAVENOUS | Status: DC
  Administered 2022-07-17: 2 g via INTRAVENOUS
  Filled 2022-07-16: qty 12.5

## 2022-07-16 MED ORDER — LACTATED RINGERS IV BOLUS: 1000.0000 mL | Freq: Once | INTRAVENOUS | Status: DC

## 2022-07-16 MED ORDER — NOREPINEPHRINE 4 MG/250ML-% IV SOLN
0.0000 ug/min | INTRAVENOUS | Status: DC
  Administered 2022-07-16: 2 ug/min via INTRAVENOUS
  Filled 2022-07-16: qty 250

## 2022-07-16 MED ORDER — GABAPENTIN 300 MG PO CAPS
300.0000 mg | ORAL_CAPSULE | Freq: Three times a day (TID) | ORAL | Status: DC
  Administered 2022-07-16 – 2022-07-19 (×10): 300 mg via ORAL
  Filled 2022-07-16 (×10): qty 1

## 2022-07-16 MED ORDER — ACETAMINOPHEN 325 MG PO TABS
650.0000 mg | ORAL_TABLET | Freq: Once | ORAL | Status: AC
  Administered 2022-07-16: 650 mg via ORAL
  Filled 2022-07-16: qty 2

## 2022-07-16 MED ORDER — IPRATROPIUM-ALBUTEROL 0.5-2.5 (3) MG/3ML IN SOLN
3.0000 mL | Freq: Four times a day (QID) | RESPIRATORY_TRACT | Status: DC
  Administered 2022-07-16 – 2022-07-17 (×2): 3 mL via RESPIRATORY_TRACT
  Filled 2022-07-16 (×3): qty 3

## 2022-07-16 MED ORDER — ONDANSETRON HCL 4 MG/2ML IJ SOLN
4.0000 mg | Freq: Once | INTRAMUSCULAR | Status: AC
  Administered 2022-07-16: 4 mg via INTRAVENOUS
  Filled 2022-07-16: qty 2

## 2022-07-16 NOTE — ED Triage Notes (Signed)
Pt to ED via EMS from home. Pt called EMS for fall. Pt's O2 69% on RA upon EMS arrival. Pt 98% on NRB upon arrival to ED. Pt had covid 1 month ago and has not felt well since. Pt had mechanical fall at home on carpet. Pt did not hit head. No LOC. Pt c/o N/V x3 days. Pt c/o generalized pain. Hx of CHF and COPD.   EMS Vitals: 20 LFA 98% NRB 134/76 130 HR

## 2022-07-16 NOTE — Progress Notes (Signed)
Per primary RN, they are hoping to be able to stop vasopressor soon so additional PIV is not needed at this time.  Another IV team consult will be placed if needed in the future.

## 2022-07-16 NOTE — Progress Notes (Signed)
Pharmacy Antibiotic Note  Joshua [name] is a 86 y.o. male admitted on 07/16/2022 with sepsis.  Pharmacy has been consulted for vancomycin and cefepime dosing.  Pt w/ AKI on CKD; baseline SCr ~1.5, currently 2.19.  Plan: Vancomycin 1769m IV x1; monitor SCr +/- vanc level prior to redosing. Cefepime 2g IV Q24H.  Height: _0  (188 cm) Weight: 83.8 kg (184 lb 12.8 oz) IBW/kg (Calculated) : 82.2  Temp (24hrs), Avg:100.5 F (38.1 C), Min:98.6 F (37 C), Max:102.4 F (39.1 C)  Recent Labs  Lab 07/16/22 0138 07/16/22 0320  WBC 10.6*  --   CREATININE 2.19*  --   LATICACIDVEN 1.9 1.9    Estimated Creatinine Clearance: 29.2 mL/min (A) (by C-G formula based on SCr of 2.19 mg/dL (H)).    Allergies  Allergen Reactions   Other Swelling    Beer- Swelling    Sunflower Oil Swelling   Sulfa Antibiotics Rash    Thank you for allowing pharmacy to be a part of this patient's care.  VWynona Neat PharmD, BCPS  07/16/2022 7:21 AM

## 2022-07-16 NOTE — ED Provider Notes (Signed)
[facility] EMERGENCY DEPARTMENT Provider Note   CSN: [id number] Arrival date & time: 07/16/22  0119     History  Chief complaint: Not feeling well  Joshua MUZZY is a 86 y.o. male.  The history is provided by the EMS personnel, the patient and the spouse.  He has history of hypertension, coronary artery disease, pulmonary embolism anticoagulated on apixaban, COPD and comes in because of just generally not feeling well for the last 3 days.  EMS was called because he had fallen at home and noted oxygen saturation of 69% on room air which improved to 98% when placed on oxygen via nonrebreather mask.  He denies fever, chills, sweats.  He has had a cough productive of small amount of white sputum.  He denies chest pain, heaviness, tightness, pressure.  He denies any change in his baseline dyspnea.  He has had some nausea but denies vomiting.  He denies any body aches.  Of note, he had been admitted to the hospital last month because of COVID-19 and pneumonia.   Home Medications Prior to Admission medications   Medication Sig Start Date End Date Taking? Authorizing Provider  albuterol (VENTOLIN HFA) 108 (90 Base) MCG/ACT inhaler Inhale 1 puff into the lungs every 4 (four) hours as needed for shortness of breath.    [provider]  allopurinol (ZYLOPRIM) 100 MG tablet Take 100 mg by mouth every morning.    [provider]  atorvastatin (LIPITOR) 40 MG tablet Take 1 tablet (40 mg total) by mouth daily. 04/26/22   Milford, Maricela Bo, FNP  Azelastine HCl 0.15 % SOLN Place 2 sprays into the nose daily as needed (allergies). 02/22/21   [provider]  benzonatate (TESSALON) 100 MG capsule Take 1 capsule (100 mg total) by mouth 3 (three) times daily as needed for cough. 05/22/22   Barb Merino, MD  Cinnamon 500 MG capsule Take 500 mg by mouth every morning.    [provider]  dapagliflozin propanediol (FARXIGA) 10 MG TABS tablet Take 1 tablet  (10 mg total) by mouth daily before breakfast. 06/28/22   Larey Dresser, MD  ELIQUIS 5 MG TABS tablet TAKE 1 TABLET BY MOUTH TWICE A DAY 02/18/22   Larey Dresser, MD  finasteride (PROSCAR) 5 MG tablet Take 5 mg by mouth at bedtime.    [provider]  gabapentin (NEURONTIN) 300 MG capsule Take 300 mg by mouth 3 (three) times daily. 04/04/22   [provider]  guaiFENesin (ROBITUSSIN) 100 MG/5ML liquid Take 10 mLs by mouth every 6 (six) hours as needed for cough or to loosen phlegm. 05/22/22   Barb Merino, MD  LINZESS 72 MCG capsule Take 72 mcg by mouth every morning. 04/15/22   [provider]  magnesium oxide (MAG-OX) 400 (240 Mg) MG tablet TAKE 1 TABLET BY MOUTH EVERY DAY 06/17/22   Larey Dresser, MD  metoCLOPramide (REGLAN) 5 MG tablet Take 5 mg by mouth 3 (three) times daily before meals.    [provider]  midodrine (PROAMATINE) 5 MG tablet Take 3 tablets (15 mg total) by mouth 3 (three) times daily. 06/20/22   Larey Dresser, MD  Multiple Vitamin (MULTIVITAMIN WITH MINERALS) TABS tablet Take 1 tablet by mouth daily. 05/01/21   Raiford Noble Latif, DO  OXYGEN Inhale 2 L into the lungs continuous.    [provider]  pantoprazole (PROTONIX) 40 MG tablet Take 40 mg by mouth every morning.    [provider]  Riociguat 2 MG TABS Take 2 mg by mouth in the morning, at noon, and at bedtime. 10/08/21   Larey Dresser, MD  STIOLTO RESPIMAT 2.5-2.5 MCG/ACT AERS INHALE 2 PUFFS BY MOUTH INTO THE LUNGS DAILY 11/26/21   [name], MD  torsemide (DEMADEX) 20 MG tablet Take 1 tablet (20 mg total) by mouth daily. 04/26/22   Rafael Bihari, FNP      Allergies    Other, Sunflower oil, and Sulfa antibiotics    Review of Systems   Review of Systems  All other systems reviewed and are negative.   Physical Exam Updated Vital Signs There were no vitals taken for this visit. Physical Exam Vitals and nursing note reviewed.   86 year old  male, resting comfortably and in no acute distress. Vital signs are significant for elevated temperature, respiratory rate, heart rate. Oxygen saturation is 88%, which is hypoxic. Head is normocephalic and atraumatic. PERRLA, EOMI. Oropharynx is clear. Neck is nontender and supple without adenopathy or JVD. Back is nontender and there is no CVA tenderness. Lungs diminished airflow diffusely with rales at the left mid and lower lung fields.  There are no wheezes or rhonchi. Chest is nontender. Heart has regular rate and rhythm without murmur. Abdomen is soft, flat, nontender. Extremities have 1+ edema, full range of motion is present. Skin is warm and dry without rash. Neurologic: Awake and alert, cranial nerves are intact, moves all extremities equally.  ED Results / Procedures / Treatments   Labs (all labs ordered are listed, but only abnormal results are displayed) Labs Reviewed  COMPREHENSIVE METABOLIC PANEL - Abnormal; Notable for the following components:      Result Value   Chloride 94 (*)    Glucose, Bld 145 (*)    BUN 27 (*)    Creatinine, Ser 2.19 (*)    GFR, Estimated 29 (*)    All other components within normal limits  CBC WITH DIFFERENTIAL/PLATELET - Abnormal; Notable for the following components:   WBC 10.6 (*)    RBC 4.09 (*)    Hemoglobin 11.5 (*)    HCT 35.6 (*)    RDW 16.0 (*)    Neutro Abs 8.5 (*)    All other components within normal limits  PROTIME-INR - Abnormal; Notable for the following components:   Prothrombin Time 21.9 (*)    INR 1.9 (*)    All other components within normal limits  URINALYSIS, ROUTINE W REFLEX MICROSCOPIC - Abnormal; Notable for the following components:   APPearance CLOUDY (*)    Glucose, UA >=500 (*)    Leukocytes,Ua SMALL (*)    All other components within normal limits  BRAIN NATRIURETIC PEPTIDE - Abnormal; Notable for the following components:   B Natriuretic Peptide 742.6 (*)    All other components within normal limits   URINALYSIS, MICROSCOPIC (REFLEX) - Abnormal; Notable for the following components:   Bacteria, UA MANY (*)    All other components within normal limits  I-STAT ARTERIAL BLOOD GAS, ED - Abnormal; Notable for the following components:   pCO2 arterial 59.2 (*)    pO2, Arterial 63 (*)    Bicarbonate 33.0 (*)    TCO2 35 (*)    Acid-Base Excess 7.0 (*)    HCT 35.0 (*)    Hemoglobin 11.9 (*)    All other components within normal limits  TROPONIN I (HIGH SENSITIVITY) - Abnormal; Notable for the following components:   Troponin I (High Sensitivity) 146 (*)  All other components within normal limits  TROPONIN I (HIGH SENSITIVITY) - Abnormal; Notable for the following components:   Troponin I (High Sensitivity) 230 (*)    All other components within normal limits  RESP PANEL BY RT-PCR (FLU A&B, COVID) ARPGX2  CULTURE, BLOOD (ROUTINE X 2)  CULTURE, BLOOD (ROUTINE X 2)  URINE CULTURE  LACTIC ACID, PLASMA  LACTIC ACID, PLASMA  APTT    EKG EKG Interpretation  Date/Time:  Tuesday July 16 2022 01:29:32 EST Ventricular Rate:  128 PR Interval:  127 QRS Duration: 166 QT Interval:  405 QTC Calculation: 592 R Axis:   111 Text Interpretation: Sinus tachycardia RBBB and LPFB When compared with ECG of 05/20/2022, No significant change was found Confirmed by Delora Fuel [id number]) on 07/16/2022 1:34:00 AM  Radiology DG Chest Port 1 View  Result Date: 07/16/2022 CLINICAL DATA:  Questionable sepsis evaluate for abnormality EXAM: PORTABLE CHEST 1 VIEW COMPARISON:  Radiographs 05/20/2022 FINDINGS: Stable cardiomegaly. Aortic atherosclerotic calcification. Prominent central pulmonary arteries. Patchy airspace opacities in the left mid and lower lung are new since 05/20/2022. No definite pleural effusion. No pneumothorax. No acute osseous abnormality. IMPRESSION: New airspace opacities in the left mid and lower lung suspicious for pneumonia versus asymmetric edema. Cardiomegaly. Electronically Signed    By: Placido Sou M.D.   On: 07/16/2022 02:00    Procedures Procedures  Cardiac monitor shows sinus tachycardia, per my interpretation.  Medications Ordered in ED Medications  lactated ringers bolus 1,000 mL (1,000 mLs Intravenous New Bag/Given 07/16/22 0156)  lactated ringers infusion (has no administration in time range)  lactated ringers bolus 2,000 mL (has no administration in time range)  cefTRIAXone (ROCEPHIN) 1 g in sodium chloride 0.9 % 100 mL IVPB (1 g Intravenous New Bag/Given 07/16/22 0217)  azithromycin (ZITHROMAX) 500 mg in sodium chloride 0.9 % 250 mL IVPB (has no administration in time range)  ondansetron (ZOFRAN) injection 4 mg (has no administration in time range)  acetaminophen (TYLENOL) tablet 650 mg (650 mg Oral Given 07/16/22 0154)    ED Course/ Medical Decision Making/ A&P                           Medical Decision Making Amount and/or Complexity of Data Reviewed Labs: ordered. Radiology: ordered.  Risk OTC drugs. Prescription drug management. Decision regarding hospitalization.   Fever with tachycardia concerning for sepsis.  Rales are suggestive of pneumonia as a source.  I have started patient on the evolving sepsis pathway and ordered acetaminophen for fever and IV fluids.  He came in wearing an nonrebreather oxygen mask and oxygen saturation was 98%.  I have reviewed his old records, and he does have a tendency towards carbon dioxide retention (PCO2 53.8 on a venous blood gas 03/21/2022, PCO2 60.5 and an arterial blood gas on 04/24/2021), so I ordered reduction of his oxygen to nasal cannula at 4 L/min.  With that, he has been maintaining oxygen saturations of 88-92%, which is felt to be acceptable.  Chest x-ray shows pneumonia in the left mid and lower lung confirming pneumonia as the source of his fever.  I have ordered the remainder of the early goal-directed fluids for sepsis and ordered antibiotics for community-acquired pneumonia.  I have also ordered  respiratory pathogen panel to see if he has coexisting COVID or influenza.  I have reviewed and interpreted his electrocardiogram and my interpretation is sinus tachycardia with right bundle branch block and left posterior fascicular block unchanged  from recent electrocardiogram.  I have reviewed and interpreted his arterial blood gas and my interpretation is compensated respiratory acidosis with PCO2 59.2 and normal pH.  We will need to avoid high oxygen saturations to prevent worsening of his respiratory acidosis.  Chest x-ray shows new airspace opacities in the left mid and lower lung suspicious for pneumonia versus asymmetric edema.  I have independently viewed the image, and agree with radiologist's interpretation.  Given fever, I feel that this represents pneumonia.  I have activated code sepsis and initiated antibiotics for community-acquired pneumonia.  I have ordered early goal-directed fluids.  I have reviewed and interpreted his laboratory tests, and my interpretation is acute on chronic renal failure with creatinine 2.19 compared with 1.40 on 07/08/2022.  Elevated random glucose.  Stable anemia.  Mild leukocytosis with left shift.  Elevated BNP consistent with known history of heart failure, BNP significantly higher than it was on 06/28/2022.  Elevated troponin which is felt to represent demand ischemia.  Normal lactic acid level.  However, blood pressure actually fell to as low as 80/54.  I have ordered additional IV fluids, but blood pressure continues to be in the upper 80s although mean arterial pressures still adequate.  Repeat lactic acid level is stable, repeat troponin has risen slightly, still most consistent with demand ischemia.  On further review of past records, he had a cardiac catheterization on 03/21/2022 which showed mild, nonobstructive coronary disease with no lesion greater than 30%.  ACS is extremely unlikely in this setting.  Also, echocardiogram on 03/18/2022 had normal ejection  fraction with grade 1 diastolic dysfunction.  I have discussed the case with Dr. Marcello Moores of [facility] who is concerned about his blood pressure and asked me to discuss case with pulmonary critical care.  I have discussed case with Dr. Verlee Monte of critical care service who states he will be glad to see the patient in consultation requests hospitalist admit the patient primarily.  I have discussed the case once again with Dr. Marcello Moores who is concerned that blood pressure is still below 90 systolic even though mean arterial pressure is adequate.  She will discuss directly with Dr. Verlee Monte who should be the admitting team.  CRITICAL CARE Performed by: Delora Fuel Total critical care time: 145 minutes Critical care time was exclusive of separately billable procedures and treating other patients. Critical care was necessary to treat or prevent imminent or life-threatening deterioration. Critical care was time spent personally by me on the following activities: development of treatment plan with patient and/or surrogate as well as nursing, discussions with consultants, evaluation of patient's response to treatment, examination of patient, obtaining history from patient or surrogate, ordering and performing treatments and interventions, ordering and review of laboratory studies, ordering and review of radiographic studies, pulse oximetry and re-evaluation of patient's condition.  Final Clinical Impression(s) / ED Diagnoses Final diagnoses:  Community acquired pneumonia of left lower lobe of lung  Sepsis due to undetermined organism ([place])  Chronic respiratory failure with hypoxia and hypercapnia (HCC)  Acute kidney injury (nontraumatic) (HCC)  Elevated troponin  Elevated brain natriuretic peptide (BNP) level  Normochromic normocytic anemia    Rx / DC Orders ED Discharge Orders     None         Delora Fuel, MD 11/65/46 [phone]

## 2022-07-16 NOTE — Sepsis Progress Note (Signed)
Sepsis protocol is being followed by [facility]. 

## 2022-07-16 NOTE — H&P (Signed)
NAME:  Joshua [name], MRN:  [id number], DOB:  November 27, 1935, LOS: 0 ADMISSION DATE:  07/16/2022, CONSULTATION DATE:  07/16/22 REFERRING MD:  Roxanne Mins, CHIEF COMPLAINT:  fall   History of Present Illness:  86yM with history of COPD, chronic hypercapnic hypoxic respiratory failure on 2L O2, CTEPH on eliquis, multifactorial PH followed by Aundra Dubin on riociguat, BPH, CKD, OSA refused CPAP   He was recently admitted for covid-19 infection/AECOPD, discharged 05/22/22 and completed course of steroids and antiviral.   He was BIBEMS after feeling unwell for the last 3d PTA and then had fallen at home, noting O2 saturation of 69% on room air. He has cough with clear sputum. No pain with in/out cath. Some nausea but no vomiting, diarrhea, abdominal pain.   Here he was found to have CXR suggestive of pneumonia and he was started on ceftriaxone, azithromycin and 5L crystalloid. PCCM consulted for hypotension/shock.  Pertinent  Medical History  COPD Chronic hypercapnic hypoxic respiratory failure on 2L O2 Multifactorial PH on riociguat CTEPH on riociguat and eliquis BPH does I/O cath CKD OSA refused CPAP  Significant Hospital Events: Including procedures, antibiotic start and stop dates in addition to other pertinent events   07/16/22 admitted started on ABX for pneumonia with multidrug resistance risk factors, peripheral levo  Interim History / Subjective:    Objective   Blood pressure (!) 86/61, pulse 77, temperature 98.6 F (37 C), temperature source Oral, resp. rate 20, SpO2 95 %.       No intake or output data in the 24 hours ending 07/16/22 0506 There were no vitals filed for this visit.  Examination: General appearance: 86 y.o., male, NAD, conversant, a little drowsy Eyes: PERRL, tracking appropriately HENT: NCAT; MMM Lungs: Rhonchorous, with normal respiratory effort CV: RRR, no murmur  Abdomen: Soft, non-tender; non-distended, BS present  Extremities: trace peripheral edema,  lukewarm Skin: Normal turgor and texture; no rash Neuro: Alert and oriented to person and place, no focal deficit    Resolved Hospital Problem list     Assessment & Plan:   # Shock # Decompensated Colon # CTEPH May have component of sepsis (pneumonia vs UTI but on bedside US there appears to be decompensated PH with impaired left heart filling with d-shaped septum.    - wean levo for MAP 65 - restart home midodrine - vanc/cefepime given recent hospitalization for pna, follow cultures and narrow as able - anticipate that he may need diuresis over the course of the day - pt requests that we notify advanced heart failure/Dr. Aundra Dubin of his admission - hold home farxiga, riociguat - heparin gtt, hold eliquis in setting AKI  # Acute on chronic hypoxic, chronic hypercapnic respiratory failure - wean FiO2 for O2 92% - ABX and pulmonary hygiene - bronchodilators  # AKI on CKD - hold home farxiga - avoid nephrotoxic medications  - f/u response to initial resuscitative measures  # Elevated troponin - likely demand related  Best Practice (right click and "Reselect all SmartList Selections" daily)   Diet/type: Regular consistency (see orders) DVT prophylaxis: systemic heparin GI prophylaxis: PPI Lines: N/A Foley:  Yes, and it is still needed Code Status:  full code Last date of multidisciplinary goals of care discussion [pt updated at bedside]  Labs   CBC: Recent Labs  Lab 07/16/22 0138 07/16/22 0217  WBC 10.6*  --   NEUTROABS 8.5*  --   HGB 11.5* 11.9*  HCT 35.6* 35.0*  MCV 87.0  --   PLT 203  --  Basic Metabolic Panel: Recent Labs  Lab 07/16/22 0138 07/16/22 0217  NA 136 139  K 4.0 3.6  CL 94*  --   CO2 28  --   GLUCOSE 145*  --   BUN 27*  --   CREATININE 2.19*  --   CALCIUM 9.6  --    GFR: CrCl cannot be calculated (Unknown ideal weight.). Recent Labs  Lab 07/16/22 0138 07/16/22 0320  WBC 10.6*  --   LATICACIDVEN 1.9 1.9    Liver Function  Tests: Recent Labs  Lab 07/16/22 0138  AST 38  ALT 25  ALKPHOS 98  BILITOT 0.9  PROT 7.2  ALBUMIN 3.5   No results for input(s): "LIPASE", "AMYLASE" in the last 168 hours. No results for input(s): "AMMONIA" in the last 168 hours.  ABG    Component Value Date/Time   PHART 7.364 07/16/2022 0217   PCO2ART 59.2 (H) 07/16/2022 0217   PO2ART 63 (L) 07/16/2022 0217   HCO3 33.0 (H) 07/16/2022 0217   TCO2 35 (H) 07/16/2022 0217   O2SAT 87 07/16/2022 0217     Coagulation Profile: Recent Labs  Lab 07/16/22 0138  INR 1.9*    Cardiac Enzymes: No results for input(s): "CKTOTAL", "CKMB", "CKMBINDEX", "TROPONINI" in the last 168 hours.  HbA1C: Hgb A1c MFr Bld  Date/Time Value Ref Range Status  03/16/2022 05:46 PM 5.0 4.8 - 5.6 % Final    Comment:    (NOTE) Pre diabetes:          5.7%-6.4%  Diabetes:              >6.4%  Glycemic control for   <7.0% adults with diabetes     CBG: No results for input(s): "GLUCAP" in the last 168 hours.  Review of Systems:   12 point review of systems is negative except as in hpi  Past Medical History:  He,  has a past medical history of Arthritis, BPH (benign prostatic hyperplasia), Chronic hiccups, COPD (chronic obstructive pulmonary disease) ([place]), Folliculitis, GERD (gastroesophageal reflux disease), Gout, Hypertension, Hypotension, NSTEMI (non-ST elevated myocardial infarction) ([place]) (03/19/2022), PE (pulmonary thromboembolism) ([place]), Phlebitis, Pneumonia, Pulmonary embolism ([place]) (03/17/2022), Pulmonary hypertension ([place]), Severe sepsis ([place]) (03/16/2022), and Sleep apnea.   Surgical History:   Past Surgical History:  Procedure Laterality Date   BIOPSY  11/03/2020   Procedure: BIOPSY;  Surgeon: Carol Ada, MD;  Location: [facility];  Service: Endoscopy;;   BIOPSY  07/06/2021   Procedure: BIOPSY;  Surgeon: Carol Ada, MD;  Location: [facility];  Service: Endoscopy;;   bone removed from little toe right foot       CHOLECYSTECTOMY     ESOPHAGOGASTRODUODENOSCOPY (EGD) WITH PROPOFOL N/A 11/03/2020   Procedure: ESOPHAGOGASTRODUODENOSCOPY (EGD) WITH PROPOFOL;  Surgeon: Carol Ada, MD;  Location: [facility];  Service: Endoscopy;  Laterality: N/A;   ESOPHAGOGASTRODUODENOSCOPY (EGD) WITH PROPOFOL N/A 07/06/2021   Procedure: ESOPHAGOGASTRODUODENOSCOPY (EGD) WITH PROPOFOL;  Surgeon: Carol Ada, MD;  Location: [facility];  Service: Endoscopy;  Laterality: N/A;   pilonidal cyst removal      RIGHT HEART CATH N/A 12/13/2020   Procedure: RIGHT HEART CATH;  Surgeon: Larey Dresser, MD;  Location: [place] [facility];  Service: Cardiovascular;  Laterality: N/A;   RIGHT/LEFT HEART CATH AND CORONARY ANGIOGRAPHY N/A 03/21/2022   Procedure: RIGHT/LEFT HEART CATH AND CORONARY ANGIOGRAPHY;  Surgeon: Larey Dresser, MD;  Location: [place] [facility];  Service: Cardiovascular;  Laterality: N/A;   TOTAL KNEE ARTHROPLASTY Left 09/13/2014   Procedure: LEFT TOTAL  KNEE ARTHROPLASTY;  Surgeon: Mauri Pole, MD;  Location: [facility];  Service: Orthopedics;  Laterality: Left;     Social History:   reports that he quit smoking about 15 years ago. His smoking use included cigarettes. He started smoking about 67 years ago. He has a 50.00 pack-year smoking history. He has never used smokeless tobacco. He reports that he does not currently use alcohol after a past usage of about 6.0 standard drinks of alcohol per week. He reports that he does not use drugs.   Family History:  His family history includes Heart attack (age of onset: 34) in his brother.   Allergies Allergies  Allergen Reactions   Other Swelling    Beer- Swelling    Sunflower Oil Swelling   Sulfa Antibiotics Rash     Home Medications  Prior to Admission medications   Medication Sig Start Date End Date Taking? Authorizing Provider  albuterol (VENTOLIN HFA) 108 (90 Base) MCG/ACT inhaler Inhale 1 puff into the lungs every 4 (four) hours as needed for shortness of  breath.   Yes [provider]  allopurinol (ZYLOPRIM) 100 MG tablet Take 100 mg by mouth every morning.   Yes [provider]  atorvastatin (LIPITOR) 40 MG tablet Take 1 tablet (40 mg total) by mouth daily. 04/26/22  Yes Milford, Maricela Bo, FNP  benzonatate (TESSALON) 100 MG capsule Take 1 capsule (100 mg total) by mouth 3 (three) times daily as needed for cough. 05/22/22  Yes Ghimire, Dante Gang, MD  Cinnamon 500 MG capsule Take 500 mg by mouth every morning.   Yes [provider]  dapagliflozin propanediol (FARXIGA) 10 MG TABS tablet Take 1 tablet (10 mg total) by mouth daily before breakfast. 06/28/22  Yes Larey Dresser, MD  ELIQUIS 5 MG TABS tablet TAKE 1 TABLET BY MOUTH TWICE A DAY Patient taking differently: Take 5 mg by mouth 2 (two) times daily. 02/18/22  Yes Larey Dresser, MD  finasteride (PROSCAR) 5 MG tablet Take 5 mg by mouth at bedtime.   Yes [provider]  gabapentin (NEURONTIN) 300 MG capsule Take 300 mg by mouth 3 (three) times daily. 04/04/22  Yes [provider]  LINZESS 72 MCG capsule Take 72 mcg by mouth every morning. 04/15/22  Yes [provider]  magnesium oxide (MAG-OX) 400 (240 Mg) MG tablet TAKE 1 TABLET BY MOUTH EVERY DAY Patient taking differently: Take 400 mg by mouth daily. 06/17/22  Yes Larey Dresser, MD  metoCLOPramide (REGLAN) 5 MG tablet Take 5 mg by mouth 3 (three) times daily before meals.   Yes [provider]  midodrine (PROAMATINE) 5 MG tablet Take 3 tablets (15 mg total) by mouth 3 (three) times daily. 06/20/22  Yes Larey Dresser, MD  Multiple Vitamin (MULTIVITAMIN WITH MINERALS) TABS tablet Take 1 tablet by mouth daily. 05/01/21  Yes Sheikh, Omair Latif, DO  OXYGEN Inhale 2 L into the lungs continuous.   Yes [provider]  pantoprazole (PROTONIX) 40 MG tablet Take 40 mg by mouth every morning.   Yes [provider]  Riociguat 2 MG TABS Take 2 mg by mouth in the morning, at  noon, and at bedtime. 10/08/21  Yes Larey Dresser, MD  STIOLTO RESPIMAT 2.5-2.5 MCG/ACT AERS INHALE 2 PUFFS BY MOUTH INTO THE LUNGS DAILY 11/26/21  Yes [name], MD  torsemide (DEMADEX) 20 MG tablet Take 1 tablet (20 mg total) by mouth daily. 04/26/22  Yes [place], Maricela Bo, FNP  Critical care time: 40 minutes

## 2022-07-16 NOTE — Progress Notes (Signed)
INTERVAL PROGRESS NOTE   86 year old gentleman with history of COPD, chronic hypercapnic and hypoxic respiratory failure, PH, CTEPH on eliquis and riocuguiat, and OSA (non-compliant with CPAP). He presented with sepsis likely from pneumonia. Admitted to ICU due to low-dose pressor requirement.  Today's Vitals   07/16/22 1415 07/16/22 1430 07/16/22 1445 07/16/22 1500  BP: (!) 96/56 122/61 125/66   Pulse: 89 93 93 93  Resp: _0 Temp:      TempSrc:      SpO2: 99% 98% 96% 96%  Weight:      Height:      PainSc:       Body mass index is 23.73 kg/m.  General: pleasant, elderly male, sitting up in bed, NAD. CV: normal rate and regular rhythm, no murmurs. No JVD. Pulm: CTABL, no adventitious sounds noted. Normal respiratory effort. On 8L HFNC. Abdomen: soft, nondistended, nontender, normoactive bowel sounds. Extremities: no peripheral edema noted. Well perfused. Skin: warm and dry. Neuro: AAOx3, no focal deficits noted. Psych: appropriate mood and affect.   Assessment/Plan:  Septic shock 2/2 PNA vs UTI Acute on chronic hypercapnic and hypoxic respiratory failure -still requiring low-dose levophed (at 61mg) -has received 4L IVF, will stop and encourage oral intake given CHF history -restarted home midodrine -MRSA swab negative, stopped vancomycin -continue cefepime for empiric coverage -f/u blood cultures, urine culture -patient does have racial pulse-ox dissociation (pulse ox 92%, ABG with pO2 63)  Pulmonary HTN CTEPH -restart home riociguat -consulted advanced HF team per patient request -bronchodilator  AKI on CKD -received about 4L IVF -avoid nephrotoxic medications -holding home farxiga -trend kidney function -monitor UOP  Elevated troponins Likely due to demand ischemia. Troponins peaked at 230.  DM CBGs well controlled thus far with SSI alone. -continue q4 sensitive SSI -trend CBGs

## 2022-07-16 NOTE — Progress Notes (Incomplete)
Attending note: I have seen and examined the patient. History, labs and imaging reviewed.  86 Y/O with H/O COPD, chronic resp failure, PH, CTEPH on eliquis and riocuguiat. OSA (Non compliant with CPAP). Admitted with sepsis, pneumonia on low dose pressors.   Blood pressure (!) 96/57, pulse 88, temperature 98.6 F (37 C), temperature source Oral, resp. rate 17, height _0  (1.88 m), weight 83.8 kg, SpO2 99 %. Gen:      No acute distress HEENT:  EOMI, sclera anicteric Neck:     No masses; no thyromegaly Lungs:    Clear to auscultation bilaterally; normal respiratory effort*** CV:         Regular rate and rhythm; no murmurs Abd:      + bowel sounds; soft, non-tender; no palpable masses, no distension Ext:    No edema; adequate peripheral perfusion Skin:      Warm and dry; no rash Neuro: alert and oriented x 3 Psych: normal mood and affect   Labs/Imaging personally reviewed, significant for BUN/Cr 27/2.19 WBC 10.6, Hb 11.5, Plts 203 CXR with pulmonary infiltrates in left mid to lower lung  Assessment/plan: Acute on chronic resp failure Sepsis secondary to pneumonia vs UTI Needing levophed intermittently DC IVF as lactic acid is normal. Has received 4 lt already Add home midodrine Stop vanco as MRSA PCR is negative Continue cefepime  Pulmonary HTN Resume home riociguat Will let heart failure know about his admission  DM Home Farxiga SSI coverage  The patient is critically ill with multiple organ systems failure and requires high complexity decision making for assessment and support, frequent evaluation and titration of therapies, application of advanced monitoring technologies and extensive interpretation of multiple databases.  Critical care time - 35 mins. This represents my time independent of the NPs time taking care of the pt.  Marshell Garfinkel MD [facility] 07/16/2022, 2:10 PM

## 2022-07-16 NOTE — Progress Notes (Addendum)
RT drew ABG on 6L [place]. PAO2 was 63 with a SAO2 87% on 6L [place]. RT placed patient on HFNC 10L at this time. Will wean as tolerated.

## 2022-07-16 NOTE — ED Notes (Signed)
Pt stating 56% on RA. Pt placed on NRB. MD notified.

## 2022-07-16 NOTE — Progress Notes (Signed)
Patient has been voiding 200 ccs of urine at a time but states he needs to be catheterized ,he says he has to do this at home. Bladder scan 753 I&O cath amount for 900 cc yellow urine with a few bright red blood clots . Joshua [name] states that he has blood at home also . I informed Dr Allyson Sabal andreceived orders to Bladder scan every 4 hours and place foley if he needs to be catheterized again.

## 2022-07-16 NOTE — ED Notes (Addendum)
Pt taken by RN to [facility] via stretcher.

## 2022-07-16 NOTE — ED Notes (Signed)
Date and time results received: 07/16/22 0240 (use smartphrase ".now" to insert current time)  Test: troponin Critical Value: 146  Name of Provider Notified: Roxanne Mins  Orders Received? Or Actions Taken?:  notified

## 2022-07-16 NOTE — ED Notes (Signed)
MD notified of pt's low BP.

## 2022-07-17 DIAGNOSIS — J189 Pneumonia, unspecified organism: Secondary | ICD-10-CM | POA: Diagnosis not present

## 2022-07-17 LAB — BASIC METABOLIC PANEL
Anion gap: 8 (ref 5–15)
BUN: 18 mg/dL (ref 8–23)
CO2: 31 mmol/L (ref 22–32)
Calcium: 9.5 mg/dL (ref 8.9–10.3)
Chloride: 97 mmol/L — ABNORMAL LOW (ref 98–111)
Creatinine, Ser: 1.33 mg/dL — ABNORMAL HIGH (ref 0.61–1.24)
GFR, Estimated: 53 mL/min — ABNORMAL LOW (ref >60)
Glucose, Bld: 93 mg/dL (ref 70–99)
Potassium: 4.1 mmol/L (ref 3.5–5.1)
Sodium: 136 mmol/L (ref 135–145)

## 2022-07-17 LAB — URINE CULTURE: Culture: NO GROWTH

## 2022-07-17 LAB — CBC
HCT: 34.4 % — ABNORMAL LOW (ref 39.0–52.0)
Hemoglobin: 10.9 g/dL — ABNORMAL LOW (ref 13.0–17.0)
MCH: 27.8 pg (ref 26.0–34.0)
MCHC: 31.7 g/dL (ref 30.0–36.0)
MCV: 87.8 fL (ref 80.0–100.0)
Platelets: 161 10*3/uL (ref 150–400)
RBC: 3.92 MIL/uL — ABNORMAL LOW (ref 4.22–5.81)
RDW: 15.9 % — ABNORMAL HIGH (ref 11.5–15.5)
WBC: 11.4 10*3/uL — ABNORMAL HIGH (ref 4.0–10.5)
nRBC: 0 % (ref 0.0–0.2)

## 2022-07-17 LAB — GLUCOSE, CAPILLARY
Glucose-Capillary: 109 mg/dL — ABNORMAL HIGH (ref 70–99)
Glucose-Capillary: 110 mg/dL — ABNORMAL HIGH (ref 70–99)
Glucose-Capillary: 121 mg/dL — ABNORMAL HIGH (ref 70–99)
Glucose-Capillary: 124 mg/dL — ABNORMAL HIGH (ref 70–99)
Glucose-Capillary: 88 mg/dL (ref 70–99)
Glucose-Capillary: 88 mg/dL (ref 70–99)

## 2022-07-17 LAB — MAGNESIUM: Magnesium: 1.6 mg/dL — ABNORMAL LOW (ref 1.7–2.4)

## 2022-07-17 MED ORDER — IPRATROPIUM-ALBUTEROL 0.5-2.5 (3) MG/3ML IN SOLN: 3.0000 mL | Freq: Four times a day (QID) | RESPIRATORY_TRACT | Status: DC | PRN

## 2022-07-17 MED ORDER — BETHANECHOL CHLORIDE 5 MG PO TABS
5.0000 mg | ORAL_TABLET | Freq: Three times a day (TID) | ORAL | Status: DC
  Administered 2022-07-17 – 2022-07-19 (×6): 5 mg via ORAL
  Filled 2022-07-17 (×8): qty 1

## 2022-07-17 MED ORDER — APIXABAN 5 MG PO TABS
5.0000 mg | ORAL_TABLET | Freq: Two times a day (BID) | ORAL | Status: DC
  Administered 2022-07-17 – 2022-07-19 (×5): 5 mg via ORAL
  Filled 2022-07-17 (×5): qty 1

## 2022-07-17 MED ORDER — ENSURE ENLIVE PO LIQD
237.0000 mL | ORAL | Status: DC
  Administered 2022-07-18 – 2022-07-19 (×2): 237 mL via ORAL

## 2022-07-17 MED ORDER — SODIUM CHLORIDE 0.9 % IV BOLUS: 250.0000 mL | Freq: Once | INTRAVENOUS | Status: DC

## 2022-07-17 MED ORDER — MAGNESIUM SULFATE 2 GM/50ML IV SOLN
2.0000 g | Freq: Once | INTRAVENOUS | Status: AC
  Administered 2022-07-17: 2 g via INTRAVENOUS
  Filled 2022-07-17: qty 50

## 2022-07-17 MED ORDER — LACTATED RINGERS IV BOLUS
250.0000 mL | Freq: Once | INTRAVENOUS | Status: AC
  Administered 2022-07-17: 250 mL via INTRAVENOUS

## 2022-07-17 MED ORDER — SODIUM CHLORIDE 0.9 % IV SOLN
2.0000 g | Freq: Two times a day (BID) | INTRAVENOUS | Status: DC
  Administered 2022-07-17: 2 g via INTRAVENOUS
  Filled 2022-07-17: qty 12.5

## 2022-07-17 MED ORDER — LACTATED RINGERS IV SOLN: INTRAVENOUS | Status: DC

## 2022-07-17 MED ORDER — SODIUM CHLORIDE 0.9 % IV SOLN
2.0000 g | Freq: Two times a day (BID) | INTRAVENOUS | Status: DC
  Administered 2022-07-17 – 2022-07-19 (×4): 2 g via INTRAVENOUS
  Filled 2022-07-17 (×4): qty 12.5

## 2022-07-17 MED ORDER — SODIUM CHLORIDE 0.9 % IV SOLN: 2.0000 g | Freq: Two times a day (BID) | INTRAVENOUS | Status: DC

## 2022-07-17 NOTE — Progress Notes (Signed)
PHARMACY NOTE:  ANTIMICROBIAL RENAL DOSAGE ADJUSTMENT  Current antimicrobial regimen includes a mismatch between antimicrobial dosage and estimated renal function.  As per policy approved by the Pharmacy & Therapeutics and Medical Executive Committees, the antimicrobial dosage will be adjusted accordingly.  Current antimicrobial dosage:  Cefepime 2g Q24H   Indication: Sepsis   Renal Function:  Estimated Creatinine Clearance: 48.1 mL/min (A) (by C-G formula based on SCr of 1.33 mg/dL (H)).  AKI on admission has improved     Antimicrobial dosage has been changed to:  Cefepime 2g Q12H   Additional comments:   Thank you for allowing pharmacy to be a part of this patient's care.  Esmeralda Arthur, PharmD, BCCCP

## 2022-07-17 NOTE — Progress Notes (Signed)
[facility] Progress Note Patient Name: Joshua [name] DOB: 12/04/1935 MRN: [id number]   Date of Service  07/17/2022  HPI/Events of Note  Pt with urinary retention, with post void residuals >436m.  eICU Interventions  Placed order for foley catheter insertion, which would facilitate measurement of I/Os.         Lilburn Straw M DELA CRUZ 07/17/2022, 5:09 AM

## 2022-07-17 NOTE — Progress Notes (Signed)
NAME:  Joshua [name], MRN:  [id number], DOB:  [birth year], LOS: 1 ADMISSION DATE:  07/16/2022, CONSULTATION DATE:  07/16/22 REFERRING MD:  Roxanne Mins, CHIEF COMPLAINT:  fall   History of Present Illness:  86yM with history of COPD, chronic hypercapnic hypoxic respiratory failure on 2L O2, CTEPH on eliquis, multifactorial PH followed by Aundra Dubin on riociguat, BPH, CKD, OSA refused CPAP   He was recently admitted for covid-19 infection/AECOPD, discharged 05/22/22 and completed course of steroids and antiviral.   He was BIBEMS after feeling unwell for the last 3d PTA and then had fallen at home, noting O2 saturation of 69% on room air. He has cough with clear sputum. No pain with in/out cath. Some nausea but no vomiting, diarrhea, abdominal pain.   Here he was found to have CXR suggestive of pneumonia and he was started on ceftriaxone, azithromycin and 5L crystalloid. PCCM consulted for hypotension/shock.  Pertinent  Medical History  COPD Chronic hypercapnic hypoxic respiratory failure on 2L O2 Multifactorial PH on riociguat CTEPH on riociguat and eliquis BPH does I/O cath CKD OSA refused CPAP  Significant Hospital Events: Including procedures, antibiotic start and stop dates in addition to other pertinent events   07/16/22 admitted started on ABX for pneumonia with multidrug resistance risk factors, peripheral levo  Interim History / Subjective:   He is feeling good today, not having much trouble with breathing. His hiccups are still bothering him, which will need to be discussed with his outpatient GI doctor (Dr. Benson Norway). Appears that hiccups are chronic and leading to recurrent N/V with a question if vomiting is leading to some aspiration and subsequent PNA development.   Discussed with Dr. Aundra Dubin, his goal SBP is >85 (as long as not experiencing dizziness). He is off of levophed now. Improved from a pulmonary standpoint as well, down to 3L [place] (home baseline of 2L [place]). If stable this  afternoon, will transfer him out of ICU.  Objective   Blood pressure (!) 95/59, pulse 88, temperature 98.4 F (36.9 C), temperature source Oral, resp. rate (!) 9, height _0  (1.88 m), weight 87.5 kg, SpO2 98 %.        Intake/Output Summary (Last 24 hours) at 07/17/2022 1154 Last data filed at 07/17/2022 0800 Gross per 24 hour  Intake 866.01 ml  Output 2420 ml  Net -1553.99 ml   Filed Weights   07/16/22 0700 07/17/22 0429  Weight: 83.8 kg 87.5 kg    Examination: General appearance: pleasant elderly male, sitting up in bed eating breakfast, conversant, NAD. HENT: [place]/AT, mucus membranes moist. Eyes: PERRL, tracking appropriately, EOMI. CV: normal rate and regular rhythm, no murmurs. Pulm: CTABL, normal respiratory effort. Abdomen: soft, nondistended, nontender, normoactive BS. Extremities: trace peripheral edema, warm.  Neuro: AAOx3, no focal deficits.   Resolved Hospital Problem list     Assessment & Plan:   Severe sepsis 2/2 PNA Acute on chronic hypercapnic and hypoxic respiratory failure He is improving clinically. Discussed with Dr. Aundra Dubin, patient's BP typically runs low. His SBP goal is >85 (as long as not dizzy). He is off of levophed now and sustaining at goal SBP with home midodrine. His urine cultures are negative. Blood cultures no growth for past day. Given his SBP typically runs low, doubtful that patient was in shock on arrival. If remains stable this afternoon, will consider transferring out of ICU. -continue cefepime for empiric coverage -continue home midodrine -off pressors -f/u blood cultures -PT/OT  Pulmonary HTN CTEPH Discussed with Dr. Aundra Dubin, continue home dose  of riociguat. He will evaluate tomorrow. -continue home riociguat -appreciate advanced HF assistance -continue bronchodilator -resumed home eliquis  Acute on Chronic Urinary Retention Patient with history of urinary retention, has to intermittently self-cath at home. He has BPH with  CT 02/2022 showing stable severe prostatomegaly. In and out cath with ~900cc yesterday. Post-void residual overnight with about 400cc. Foley catheter placed. He is on finasteride at home for BPH (has sulfa allergy so unable to use flomax). Will add bethanechol to help with retention in hopes of expediting foley removal.  -continue finasteride -foley catheter in place -added bethanechol  AKI on CKD - improved Unclear severity of CKD. Per chart review, creatinine typically remains between 1.3-1.5. His AKI has improved with fluid resuscitation, Cr down to 1.33.  -avoid nephrotoxic medications -trend kidney function -monitor UOP -holding home farxiga, restart tomorrow if renal function stable  Hypomagnesemia Mg 1.6. -repleting -f/u Mg tomorrow morning  Elevated troponins Likely due to demand ischemia. Troponins peaked at 230.  DM On farxiga monotherapy at home. CBGs have remained at goal since admission. Will stop SSI given no current need. -stop SSI -restart farxiga tomorrow if renal function stable  Best Practice (right click and "Reselect all SmartList Selections" daily)   Diet/type: Regular consistency (see orders) DVT prophylaxis: systemic heparin GI prophylaxis: PPI Lines: N/A Foley:  Yes, and it is still needed Code Status:  full code Last date of multidisciplinary goals of care discussion - pt and daughter updated at bedside    Virl Axe, MD 07/17/2022, 11:54 AM   Labs   CBC: Recent Labs  Lab 07/16/22 0138 07/16/22 0217 07/17/22 0704  WBC 10.6*  --  11.4*  NEUTROABS 8.5*  --   --   HGB 11.5* 11.9* 10.9*  HCT 35.6* 35.0* 34.4*  MCV 87.0  --  87.8  PLT 203  --  206    Basic Metabolic Panel: Recent Labs  Lab 07/16/22 0138 07/16/22 0217 07/17/22 0704  NA 136 139 136  K 4.0 3.6 4.1  CL 94*  --  97*  CO2 28  --  31  GLUCOSE 145*  --  93  BUN 27*  --  18  CREATININE 2.19*  --  1.33*  CALCIUM 9.6  --  9.5  MG  --   --  1.6*

## 2022-07-17 NOTE — Progress Notes (Signed)
Attending note: I have seen and examined the patient. History, labs and imaging reviewed.  86 Y/O with H/O COPD, chronic resp failure, PH, CTEPH on eliquis and riocuguiat. OSA (Non compliant with CPAP). Admitted with sepsis, pneumonia. Off pressors but BP is low  Blood pressure (!) 95/59, pulse 88, temperature 99.9 F (37.7 C), temperature source Oral, resp. rate (!) 9, height _0  (1.88 m), weight 87.5 kg, SpO2 98 %. Gen:      No acute distress HEENT:  EOMI, sclera anicteric Neck:     No masses; no thyromegaly Lungs:    Clear to auscultation bilaterally; normal respiratory effort CV:         Regular rate and rhythm; no murmurs Abd:      + bowel sounds; soft, non-tender; no palpable masses, no distension Ext:    No edema; adequate peripheral perfusion Skin:      Warm and dry; no rash Neuro: alert and oriented x 3 Psych: normal mood and affect   Labs/Imaging personally reviewed, significant for Cr 1.33, WBC 11.4, Hb 10.9, Plts 161 No new imaging  Assessment/plan: Acute on chronic resp failure Sepsis secondary to pneumonia vs UTI Needing levophed intermittently Added home midodrine Off vanco as MRSA PCR is negative Continue cefepime   Pulmonary HTN Resumed home riociguat. Consider a reduced dose as his BP is boderline low Will let heart failure know about his admission Resume eliquis   DM Hold home Farxiga SSI coverage  Urinary retention Finasteride. Start urecholine  The patient is critically ill with multiple organ systems failure and requires high complexity decision making for assessment and support, frequent evaluation and titration of therapies, application of advanced monitoring technologies and extensive interpretation of multiple databases.  Critical care time - 35 mins. This represents my time independent of the NPs time taking care of the pt.  Marshell Garfinkel MD [facility] 07/17/2022, 10:33 AM

## 2022-07-17 NOTE — Progress Notes (Signed)
Brief Nutrition Note  Alerted by RN that pt requests an ensure with his lunch tray. Currently on a regular diet. Added ensure enlive 1x/d per pt request. RD will not follow at this time, please place consult if acute needs arise.   Ranell Patrick, RD, LDN Clinical Dietitian RD pager # available in [place]  After hours/weekend pager # available in [facility]

## 2022-07-17 NOTE — Progress Notes (Signed)
Patient transferring out ICU to progressive, [facility] (Dr. Avon Gully) to take over care tomorrow 7AM.

## 2022-07-18 ENCOUNTER — Encounter (HOSPITAL_COMMUNITY): Payer: Self-pay | Admitting: Student

## 2022-07-18 DIAGNOSIS — R6520 Severe sepsis without septic shock: Secondary | ICD-10-CM | POA: Diagnosis not present

## 2022-07-18 DIAGNOSIS — A419 Sepsis, unspecified organism: Secondary | ICD-10-CM

## 2022-07-18 DIAGNOSIS — I50810 Right heart failure, unspecified: Secondary | ICD-10-CM

## 2022-07-18 LAB — GLUCOSE, CAPILLARY
Glucose-Capillary: 91 mg/dL (ref 70–99)
Glucose-Capillary: 86 mg/dL (ref 70–99)
Glucose-Capillary: 110 mg/dL — ABNORMAL HIGH (ref 70–99)
Glucose-Capillary: 111 mg/dL — ABNORMAL HIGH (ref 70–99)

## 2022-07-18 LAB — CBC
HCT: 31.9 % — ABNORMAL LOW (ref 39.0–52.0)
Hemoglobin: 10.4 g/dL — ABNORMAL LOW (ref 13.0–17.0)
MCH: 27.9 pg (ref 26.0–34.0)
MCHC: 32.6 g/dL (ref 30.0–36.0)
MCV: 85.5 fL (ref 80.0–100.0)
Platelets: 172 10*3/uL (ref 150–400)
RBC: 3.73 MIL/uL — ABNORMAL LOW (ref 4.22–5.81)
RDW: 15.5 % (ref 11.5–15.5)
WBC: 7.1 10*3/uL (ref 4.0–10.5)
nRBC: 0 % (ref 0.0–0.2)

## 2022-07-18 LAB — BASIC METABOLIC PANEL
Anion gap: 6 (ref 5–15)
BUN: 15 mg/dL (ref 8–23)
CO2: 33 mmol/L — ABNORMAL HIGH (ref 22–32)
Calcium: 9.8 mg/dL (ref 8.9–10.3)
Chloride: 99 mmol/L (ref 98–111)
Creatinine, Ser: 1.21 mg/dL (ref 0.61–1.24)
GFR, Estimated: 59 mL/min — ABNORMAL LOW (ref >60)
Glucose, Bld: 95 mg/dL (ref 70–99)
Potassium: 4.1 mmol/L (ref 3.5–5.1)
Sodium: 138 mmol/L (ref 135–145)

## 2022-07-18 LAB — MAGNESIUM: Magnesium: 2 mg/dL (ref 1.7–2.4)

## 2022-07-18 MED ORDER — ONDANSETRON HCL 4 MG/2ML IJ SOLN
4.0000 mg | Freq: Four times a day (QID) | INTRAMUSCULAR | Status: DC | PRN
  Administered 2022-07-18: 4 mg via INTRAVENOUS
  Filled 2022-07-18: qty 2

## 2022-07-18 MED ORDER — PNEUMOCOCCAL 20-VAL CONJ VACC 0.5 ML IM SUSY
0.5000 mL | PREFILLED_SYRINGE | INTRAMUSCULAR | Status: AC
  Administered 2022-07-19: 0.5 mL via INTRAMUSCULAR
  Filled 2022-07-18: qty 0.5

## 2022-07-18 MED ORDER — DAPAGLIFLOZIN PROPANEDIOL 10 MG PO TABS
10.0000 mg | ORAL_TABLET | Freq: Every day | ORAL | Status: DC
  Administered 2022-07-18 – 2022-07-19 (×2): 10 mg via ORAL
  Filled 2022-07-18 (×2): qty 1

## 2022-07-18 NOTE — Plan of Care (Signed)

## 2022-07-18 NOTE — Progress Notes (Signed)
PROGRESS NOTE    Joshua [name]  HDQ:[id number] DOB: Feb 05, 1936 DOA: 07/16/2022 PCP: [name], DO  No chief complaint on file.   Brief Narrative:  86 year old gentleman with history of COPD, chronic hypercapnic and hypoxic respiratory failure, PH, CTEPH on eliquis and riocuguiat, and OSA (non-compliant with CPAP). He presented with sepsis likely from pneumonia. Admitted to ICU due to low-dose pressor requirement.   11/30: [facility] assumed care  Assessment & Plan:   Principal Problem:   Sepsis ([place]) Active Problems:   Community acquired pneumonia of left lower lobe of lung  Severe sepsis 2/2 PNA Acute on chronic hypercapnic and hypoxic respiratory failure Hypotension Per PCCM service's discussion with Dr. Aundra Dubin, patient's BP typically runs low. His SBP goal is >85 (as long as not dizzy).  Will continue midodrine for now. His urine cultures are negative. Blood cultures no growth for past day. Given his SBP typically runs low, question whether shock present on arrival.  - CXR with new airspace opacities in the L mid and lower lung concerning for pneumonia vs asymmetric edema -continue cefepime for empiric coverage -continue home midodrine -f/u blood cultures NGTD x2   Pulmonary HTN CTEPH -continue home riociguat -appreciate advanced HF assistance -continue bronchodilator -resumed home eliquis -Dr. Sylvester Harder notes torsemide likely qod at discharge   Acute on Chronic Urinary Retention -continue finasteride -foley catheter in place as of 11/29, may need to discharge with this -bethanechol   AKI on CKD - improved Unclear severity of CKD. Per chart review, creatinine typically remains between 1.3-1.5. His AKI has improved with fluid resuscitation -avoid nephrotoxic medications -trend kidney function -monitor UOP   Hypomagnesemia Improved, follow    Elevated troponins Likely due to demand ischemia. Troponins peaked at 230.   DM - A1c 5 in July 2023 - resuming farxiga,  stop SSI and BG checks   Follow therapy evals    DVT prophylaxis: eliquis Code Status: full Family Communication: none Disposition:   Status is: Inpatient Remains inpatient appropriate because: awaiting safe d/c plan   Consultants:  PCCM HF team  Procedures:  none  Antimicrobials:  Anti-infectives (From admission, onward)    Start     Dose/Rate Route Frequency Ordered Stop   07/17/22 2200  ceFEPIme (MAXIPIME) 2 g in sodium chloride 0.9 % 100 mL IVPB  Status:  Discontinued        2 g 200 mL/hr over 30 Minutes Intravenous Every 12 hours 07/17/22 1053 07/17/22 1101   07/17/22 2200  ceFEPIme (MAXIPIME) 2 g in sodium chloride 0.9 % 100 mL IVPB        2 g 200 mL/hr over 30 Minutes Intravenous Every 12 hours 07/17/22 1101     07/17/22 1000  ceFEPIme (MAXIPIME) 2 g in sodium chloride 0.9 % 100 mL IVPB  Status:  Discontinued        2 g 200 mL/hr over 30 Minutes Intravenous Every 12 hours 07/17/22 0851 07/17/22 1053   07/17/22 0600  ceFEPIme (MAXIPIME) 2 g in sodium chloride 0.9 % 100 mL IVPB  Status:  Discontinued        2 g 200 mL/hr over 30 Minutes Intravenous Every 24 hours 07/16/22 0721 07/17/22 0851   07/16/22 0730  vancomycin (VANCOREADY) IVPB 1750 mg/350 mL        1,750 mg 175 mL/hr over 120 Minutes Intravenous  Once 07/16/22 0719 07/16/22 1013   07/16/22 0730  ceFEPIme (MAXIPIME) 2 g in sodium chloride 0.9 % 100 mL IVPB  2 g 200 mL/hr over 30 Minutes Intravenous  Once 07/16/22 0719 07/16/22 0806   07/16/22 0721  vancomycin variable dose per unstable renal function (pharmacist dosing)  Status:  Discontinued         Does not apply See admin instructions 07/16/22 0721 07/16/22 1419   07/16/22 0215  levofloxacin (LEVAQUIN) IVPB 750 mg  Status:  Discontinued        750 mg 100 mL/hr over 90 Minutes Intravenous  Once 07/16/22 0208 07/16/22 0212   07/16/22 0215  cefTRIAXone (ROCEPHIN) 1 g in sodium chloride 0.9 % 100 mL IVPB        1 g 200 mL/hr over 30 Minutes  Intravenous  Once 07/16/22 0213 07/16/22 0254   07/16/22 0215  azithromycin (ZITHROMAX) 500 mg in sodium chloride 0.9 % 250 mL IVPB        500 mg 250 mL/hr over 60 Minutes Intravenous  Once 07/16/22 0213 07/16/22 0325       Subjective: No new complaints  Objective: Vitals:   07/18/22 0357 07/18/22 0449 07/18/22 0840 07/18/22 1146  BP: (!) 94/54  (!) 125/91 (!) 94/56  Pulse: 79  91 86  Resp: _0 Temp:   98 F (36.7 C) 98 F (36.7 C)  TempSrc:   Oral Oral  SpO2: 96%  97% 100%  Weight:  86.4 kg    Height:        Intake/Output Summary (Last 24 hours) at 07/18/2022 1601 Last data filed at 07/18/2022 9432 Gross per 24 hour  Intake 820 ml  Output 2200 ml  Net -1380 ml   Filed Weights   07/17/22 0429 07/18/22 0048 07/18/22 0449  Weight: 87.5 kg 86.4 kg 86.4 kg    Examination:  General exam: Appears calm and comfortable  Respiratory system: unlabored Cardiovascular system: RRR Gastrointestinal system: Abdomen is nondistended, soft and nontender.  Central nervous system: Alert and oriented. No focal neurological deficits. Extremities: no LEE    Data Reviewed: I have personally reviewed following labs and imaging studies  CBC: Recent Labs  Lab 07/16/22 0138 07/16/22 0217 07/17/22 0704 07/18/22 0355  WBC 10.6*  --  11.4* 7.1  NEUTROABS 8.5*  --   --   --   HGB 11.5* 11.9* 10.9* 10.4*  HCT 35.6* 35.0* 34.4* 31.9*  MCV 87.0  --  87.8 85.5  PLT 203  --  161 003    Basic Metabolic Panel: Recent Labs  Lab 07/16/22 0138 07/16/22 0217 07/17/22 0704 07/18/22 0355  NA 136 139 136 138  K 4.0 3.6 4.1 4.1  CL 94*  --  97* 99  CO2 28  --  31 33*  GLUCOSE 145*  --  93 95  BUN 27*  --  18 15  CREATININE 2.19*  --  1.33* 1.21  CALCIUM 9.6  --  9.5 9.8  MG  --   --  1.6* 2.0    GFR: Estimated Creatinine Clearance: 52.8 mL/min (by C-G formula based on SCr of 1.21 mg/dL).  Liver Function Tests: Recent Labs  Lab 07/16/22 0138  AST 38  ALT 25   ALKPHOS 98  BILITOT 0.9  PROT 7.2  ALBUMIN 3.5    CBG: Recent Labs  Lab 07/17/22 1922 07/17/22 2345 07/18/22 0355 07/18/22 0813 07/18/22 1143  GLUCAP 124* 121* 91 86 110*     Recent Results (from the past 240 hour(s))  Urine Culture     Status: None   Collection Time: 07/16/22  1:38 AM  Specimen: In/Out Cath Urine  Result Value Ref Range Status   Specimen Description IN/OUT CATH URINE  Final   Special Requests NONE  Final   Culture   Final    NO GROWTH Performed at [facility], [address] [place], [place] [address]    Report Status 07/17/2022 FINAL  Final  Blood Culture (routine x 2)     Status: None (Preliminary result)   Collection Time: 07/16/22  1:45 AM   Specimen: BLOOD RIGHT FOREARM  Result Value Ref Range Status   Specimen Description BLOOD RIGHT FOREARM  Final   Special Requests   Final    BOTTLES DRAWN AEROBIC AND ANAEROBIC Blood Culture adequate volume   Culture   Final    NO GROWTH 2 DAYS Performed at [facility], [place] [address], [place], [place] [address]    Report Status PENDING  Incomplete  Resp Panel by RT-PCR (Flu Manson Luckadoo&B, Covid)     Status: None   Collection Time: 07/16/22  1:45 AM   Specimen: Nasal Swab  Result Value Ref Range Status   SARS Coronavirus 2 by RT PCR NEGATIVE NEGATIVE Final    Comment: (NOTE) SARS-CoV-2 target nucleic acids are NOT DETECTED.  The SARS-CoV-2 RNA is generally detectable in upper respiratory specimens during the acute phase of infection. The lowest concentration of SARS-CoV-2 viral copies this assay can detect is 138 copies/mL. Jose Alleyne negative result does not preclude SARS-Cov-2 infection and should not be used as the sole basis for treatment or other patient management decisions. Maria Coin negative result may occur with  improper specimen collection/handling, submission of specimen other than nasopharyngeal swab, presence of viral mutation(s) within the areas targeted by this assay, and inadequate number of  viral copies(<138 copies/mL). Alissandra Geoffroy negative result must be combined with clinical observations, patient history, and epidemiological information. The expected result is Negative.  Fact Sheet for Patients:  EntrepreneurPulse.com.au  Fact Sheet for Healthcare Providers:  IncredibleEmployment.be  This test is no t yet approved or cleared by the [place] FDA and  has been authorized for detection and/or diagnosis of SARS-CoV-2 by FDA under an Emergency Use Authorization (EUA). This EUA will remain  in effect (meaning this test can be used) for the duration of the COVID-19 declaration under Section 564(b)(1) of the Act, 21 U.S.C.section 360bbb-3(b)(1), unless the authorization is terminated  or revoked sooner.       Influenza Elveta Rape by PCR NEGATIVE NEGATIVE Final   Influenza B by PCR NEGATIVE NEGATIVE Final    Comment: (NOTE) The Xpert Xpress SARS-CoV-2/FLU/RSV plus assay is intended as an aid in the diagnosis of influenza from Nasopharyngeal swab specimens and should not be used as Anakin Varkey sole basis for treatment. Nasal washings and aspirates are unacceptable for Xpert Xpress SARS-CoV-2/FLU/RSV testing.  Fact Sheet for Patients: EntrepreneurPulse.com.au  Fact Sheet for Healthcare Providers: IncredibleEmployment.be  This test is not yet approved or cleared by the [place] FDA and has been authorized for detection and/or diagnosis of SARS-CoV-2 by FDA under an Emergency Use Authorization (EUA). This EUA will remain in effect (meaning this test can be used) for the duration of the COVID-19 declaration under Section 564(b)(1) of the Act, 21 U.S.C. section 360bbb-3(b)(1), unless the authorization is terminated or revoked.  Performed at [facility], [place] [address], [place], [place] [address]   Blood Culture (routine x 2)     Status: None (Preliminary result)   Collection Time: 07/16/22  1:47 AM    Specimen: BLOOD  LEFT ARM  Result Value Ref Range Status   Specimen Description BLOOD LEFT ARM  Final   Special Requests   Final    BOTTLES DRAWN AEROBIC AND ANAEROBIC Blood Culture adequate volume   Culture   Final    NO GROWTH 2 DAYS Performed at [facility], [address], [place], [place] [address]    Report Status PENDING  Incomplete  MRSA Next Gen by PCR, Nasal     Status: None   Collection Time: 07/16/22  6:43 AM   Specimen: Nasal Mucosa; Nasal Swab  Result Value Ref Range Status   MRSA by PCR Next Gen NOT DETECTED NOT DETECTED Final    Comment: (NOTE) The GeneXpert MRSA Assay (FDA approved for NASAL specimens only), is one component of Kariah Loredo comprehensive MRSA colonization surveillance program. It is not intended to diagnose MRSA infection nor to guide or monitor treatment for MRSA infections. Test performance is not FDA approved in patients less than 17 years old. Performed at [facility], [place] [address] [place], [place] [address]          Radiology Studies: No results found.      Scheduled Meds:  apixaban  5 mg Oral BID   arformoterol  15 mcg Nebulization BID   atorvastatin  40 mg Oral Daily   bethanechol  5 mg Oral TID   Chlorhexidine Gluconate Cloth  6 each Topical Daily   dapagliflozin propanediol  10 mg Oral Daily   feeding supplement  237 mL Oral Q24H   finasteride  5 mg Oral QHS   gabapentin  300 mg Oral TID   midodrine  15 mg Oral TID   pantoprazole  40 mg Oral q morning   Riociguat  2 mg Oral 3 times per day   Continuous Infusions:  sodium chloride Stopped (07/16/22 1715)   ceFEPime (MAXIPIME) IV 2 g (07/18/22 1004)     LOS: 2 days    Time spent: over 30 min    Fayrene Helper, MD [facility]   To contact the attending provider between 7A-7P or the covering provider during after hours 7P-7A, please log into the web site [email or web address] and access using universal [place] password for that web site. If you do not  have the password, please call the hospital operator.  07/18/2022, 4:01 PM

## 2022-07-18 NOTE — Evaluation (Signed)
Occupational Therapy Evaluation Patient Details Name: Joshua [name] MRN: [id number] DOB: April 30, 1936 Today's Date: 07/18/2022   History of Present Illness The pt is an 86 yo male presenting 11/28 after a fall at home, hypoxic to 69% on RA upon arrival of EMS. Work up concerning for PNA, hospitalization complicated by hypotension and shock. PMH includes: HTN, CAD, chronic thrombolytic pulmonary hypertension, PE on anticoagulation, CHF, COPD.   Clinical Impression   PTA pt lives at home independently with wife. At baseline, pt uses 2L O2 and states his O2 is anywhere form 85 with activity to the low 90s. Currently requires [place] A with ADL and mobility and RW level due to deficits listed below.  Low BP as indicated below; Spo2 @ 85 on 2L, however able to maintain above 89 on 3L. Pt with minimal c/o SOB however able to carry on a conversation without DOE.  Hopeful pt will progress to DC home with [place].  Acute OT to follow.      Recommendations for follow up therapy are one component of a multi-disciplinary discharge planning process, led by the attending physician.  Recommendations may be updated based on patient status, additional functional criteria and insurance authorization.   Follow Up Recommendations  Home health OT (pending progress))     Assistance Recommended at Discharge Frequent or constant Supervision/Assistance  Patient can return home with the following A little help with walking and/or transfers;A little help with bathing/dressing/bathroom;Assistance with cooking/housework;Assist for transportation;Help with stairs or ramp for entrance    Functional Status Assessment  Patient has had a recent decline in their functional status and demonstrates the ability to make significant improvements in function in a reasonable and predictable amount of time.  Equipment Recommendations  None recommended by OT    Recommendations for Other Services       Precautions / Restrictions  Precautions Precautions: Fall Precaution Comments: watch BP, SBP >85, SpO2 88-92% Restrictions Weight Bearing Restrictions: No      Mobility Bed Mobility Overal bed mobility: Needs Assistance Bed Mobility: Supine to Sit     Supine to sit: Min guard     General bed mobility comments: minG with HOB elevated, no physical assist but pt using increased time and bed rails to come to sitting EOB    Transfers Overall transfer level: Needs assistance Equipment used: None Transfers: Sit to/from Stand Sit to Stand: Min guard           General transfer comment: pt needing multilple attempts to rise from EOB, but then able to stand without physical assist. miNG for safety.      Balance Overall balance assessment: Needs assistance Sitting-balance support: No upper extremity supported Sitting balance-Leahy Scale: Good     Standing balance support: No upper extremity supported Standing balance-Leahy Scale: Fair Standing balance comment: poor tolerance for challenge but no overt LOB with ambulation without UE support                           ADL either performed or assessed with clinical judgement   ADL Overall ADL's : Needs assistance/impaired Eating/Feeding: Independent   Grooming: Set up   Upper Body Bathing: Set up;Sitting   Lower Body Bathing: Min guard;Sit to/from stand   Upper Body Dressing : Set up   Lower Body Dressing: Min guard   Toilet Transfer: Min guard     Toileting - Clothing Manipulation Details (indicate cue type and reason): foley     Functional  mobility during ADLs: Min guard;Rolling walker (2 wheels);Cueing for safety       Vision Baseline Vision/History: 1 Wears glasses       Perception     Praxis      Pertinent Vitals/Pain Pain Assessment Pain Assessment: No/denies pain     Hand Dominance Right   Extremity/Trunk Assessment Upper Extremity Assessment Upper Extremity Assessment: Overall WFL for tasks assessed    Lower Extremity Assessment Lower Extremity Assessment: Defer to PT evaluation   Cervical / Trunk Assessment Cervical / Trunk Assessment: Normal   Communication Communication Communication: No difficulties   Cognition Arousal/Alertness: Awake/alert Behavior During Therapy: WFL for tasks assessed/performed Overall Cognitive Status: Within Functional Limits for tasks assessed                                 General Comments: pt with slightly slowed responses at times but able to answer all questions and joke appropriately. follows all cues well in session; most likely baseline     General Comments  Soft BP with change in position, low of 77/47 (54) after standing for 3 min pt denies sx. SpO2 89-94% on 2L at rest, low of 85% on 2L with gait and improved to 93% with 3L    Exercises     Shoulder Instructions      Home Living Family/patient expects to be discharged to:: Private residence Living Arrangements: Spouse/significant other Available Help at Discharge: Family;Available 24 hours/day Type of Home: House Home Access: Stairs to enter [employer] of Steps: 6 Entrance Stairs-Rails: Right Home Layout: Two level;Bed/bath upstairs Alternate Level Stairs-Number of Steps: 6+landing+6+landing+4 Alternate Level Stairs-Rails: Right Bathroom Shower/Tub: Occupational psychologist: Standard Bathroom Accessibility: Yes   Home Equipment: Conservation officer, nature (2 wheels);Cane - single point;BSC/3in1;Shower seat   Additional Comments: supplemental O2 use PRN; 2L/min at baseline, occasionally 3L if doing intense walking/activity      Prior Functioning/Environment Prior Level of Function : Independent/Modified Independent;Driving             Mobility Comments: pt reports independent without DME, no falls other than one PTA (unable to get up on his own). reports mostly sedentary during day, watching TV ADLs Comments: pt reports he can still drive and run  errands, goes to [employer]        OT Problem List: Cardiopulmonary status limiting activity;Decreased safety awareness;Decreased strength;Decreased range of motion;Decreased activity tolerance;Impaired balance (sitting and/or standing)      OT Treatment/Interventions: Self-care/ADL training;Therapeutic exercise;Energy conservation;DME and/or AE instruction;Therapeutic activities;Patient/family education;Balance training    OT Goals(Current goals can be found in the care plan section) Acute Rehab OT Goals Patient Stated Goal: to get better, go home and not come back OT Goal Formulation: With patient Time For Goal Achievement: 08/01/22 Potential to Achieve Goals: Good  OT Frequency: Min 2X/week    Co-evaluation PT/OT/SLP Co-Evaluation/Treatment: Yes Reason for Co-Treatment: For patient/therapist safety PT goals addressed during session: Mobility/safety with mobility;Balance;Strengthening/ROM OT goals addressed during session: ADL's and self-care      AM-PAC OT "6 Clicks" Daily Activity     Outcome Measure Help from another person eating meals?: None Help from another person taking care of personal grooming?: A Little Help from another person toileting, which includes using toliet, bedpan, or urinal?: Total (foley) Help from another person bathing (including washing, rinsing, drying)?: A Little Help from another person to put on and taking off regular upper body clothing?: A Little  Help from another person to put on and taking off regular lower body clothing?: A Little 6 Click Score: 17   End of Session Equipment Utilized During Treatment: Gait belt;Rolling walker (2 wheels);Oxygen (2-3 L) Nurse Communication: Mobility status;Other (comment) (BP)  Activity Tolerance: Patient tolerated treatment well Patient left: in chair;with call bell/phone within reach;with chair alarm set  OT Visit Diagnosis: Unsteadiness on feet (R26.81);Muscle weakness (generalized) (M62.81)                 Time: 3276-1470 OT Time Calculation (min): 21 min Charges:  OT General Charges $OT Visit: 1 Visit OT Evaluation $OT Eval Moderate Complexity: [place], OT/L   Acute OT Clinical Specialist Acute Rehabilitation Services Pager [phone] Office [phone]   [facility] 07/18/2022, 12:07 PM

## 2022-07-18 NOTE — Progress Notes (Addendum)
[facility] night cross cover note:   I was notified by patient's RN that this patient is currently experiencing some nausea/vomiting and does not currently have order for prn antiemetic.  I subsequently placed order for prn IV Zofran.   Update: in setting of persistent cough, pt's RN contacted me with request for anti-tussive medication. I subsequently placed order for prn Tessalon Perles.    Babs Bertin, DO Hospitalist

## 2022-07-18 NOTE — Consult Note (Addendum)
Advanced Heart Failure Team Consult Note   Primary Physician: [name], DO PCP-Cardiologist:  Sherren Mocha, MD  Reason for Consultation: HFpEF and pulmonary hypertension  HPI:    Joshua [name] is seen today for evaluation of HFpEF and pulmonary hypertension at the request of Dr. Vaughan Browner, PCCM.   86 y.o. with history of HFPEF, prior PE, COPD, chronic hypercapnic respiratory failure on 2L at home, pulmonary hypertension, BPH, CKD, and OSA.  He was referred by Dr. Gasper Sells for evaluation of pulmonary hypertension.  Patient had a PE in 2016.  He is on apixaban. He has a long history of diastolic CHF.  ECHO with significant component of RV failure with EF 55-60%, IV septum flattened, severe RV enlargement, severely decreased RV function, PASP 57 mmHg.  CTA chest in 4/22 showed no acute PE and mild emphysema, but V/Q scan in 5/22 was suggestive of chronic PE in the right middle lobe.  RHC in 4/22 showed normal filling pressures with moderate PAH. He additionally has chronic hiccups followed by Dr. Benson Norway, now on gabapentin. PFTs in 6/22 showed mixed picture with severe obstruction, moderate restriction, and severely decreased DLCO.    Admitted 8/25-8/30/22 with A/C HF exacerbation. He was aggressively diuresed with lasix/metolazone, had unna boots, farxiga added.  Midodrine was increased for hypotension. Hospitalization c/b AKI on CKD III.   Readmitted 9/1-9/12/22 with shock (mixed septic and cardiogenic), likely 2/2 to aspiration pneumonia and A/C CHF.  Suspect possible gut translocation with ileus/partial SBO; CT abdomen with "nutcracker phenomenon".  He was started on NE + antibiotics. General surgery consulted and felt not to be a surgical candidate. Diuretics initially held due to over-diuresis and shock. Eventually able to wean pressors off, restart PO torsemide and midodrine. Hospitalization c/b transaminitis, delirium, and BPH with urinary retention, requiring foley catheter.  Palliative care was consulted for [place] and patient DNR/DNI. PT/OT recommended HH. He was discharged home, weight 220 lbs.   EGD 11/22 showed esophagitis, hiatal hernia and yeast.    Acute visit 07/17/21 for weakness/falls. He had a syncopal episode in clinic. Labs showed hypokalemia of 3.3, SCr elevated at 2.13, ReDs 27%. [place] stopped, torsemide decreased to 40 mg daily and compression hose and abdominal binder ordered. 3-day Zio placed to evaluate for possible arrhythmogenic cause of symptoms.  Zio monitor showed 19 second run of VT.    Follow up 1/23, rare atypical chest pain. NYHA III, weight continued to trend down.   Admitted 2/23 with PNA and a/c CHF. Seen by palliative care and remained full code.   Admitted 8/23 with septic shock due to aspiration PNA & possible colitis. Echo showed EF 60-65%, RV severely reduced, severely enlarged, RVSP 50, LVEF 60-65%, no RWMA, mild TR, trivial MR. AHF consulted for elevated Hs trop. Underwent R/LHC showing nonobstructive mild CAD, normal/low filling pressures and mild PAH. Troponin elevation likely due to demand ischemia. Hospitalization c/b AKI on CKD. He was restarted on Farxiga and lower dose torsemide (10 mg daily). He was discharged home, weight 175 lbs.   Joshua [name] was recently discharged 05/22/22 with covid-19. Completed course of abx and steroids. Was brought in by EMS after he had a fall in the bathroom and he couldn't get up. He had been having 3 days of nausea and vomiting. Per EMS, hypotensive and O2 sats 69% on RA. Reports full compliance with all medications.   In the ED CXR suggestive of PNA, HsTrop 146>>230. EKG ST 128bpm RBBB. BNP 742. Cr 2.19 (baseline ~1.2).  Was started on ceftriaxone and azithromycin and received 5L IVF. Was hypotensive requiring levo. Sats improved with [place].   Was working with PT. BP dropped upon standing, back to baseline when returned to chair. Suspect orthostatic. Feels fine in chair. Denies CP or SOB. Only SOB  with activity.   Cardiac studies reviewed: 8/23 Echo RV severely reduced, severely enlarged, RVSP 50, LVEF 60-65%, no RWMA, mild TR, trivial MR.   RHC 8/23 showed normal/low filling pressures and mild PAH  2/22 Echo:  EF 55-60%, IV septum flattened, severe RV enlargement, severely decreased RV function, PASP 57 mmHg  V/Q scan in 5/22 was suggestive of chronic PE in the right middle lobe RHC in 4/22 showed normal filling pressures with moderate PAH. He additionally has chronic hiccups followed by Dr. Benson Norway, now on gabapentin.  PFTs in 6/22 showed mixed picture with severe obstruction, moderate restriction, and severely decreased DLCO    Home Medications Prior to Admission medications   Medication Sig Start Date End Date Taking? Authorizing Provider  albuterol (VENTOLIN HFA) 108 (90 Base) MCG/ACT inhaler Inhale 1 puff into the lungs every 4 (four) hours as needed for shortness of breath.   Yes [provider]  allopurinol (ZYLOPRIM) 100 MG tablet Take 100 mg by mouth every morning.   Yes [provider]  atorvastatin (LIPITOR) 40 MG tablet Take 1 tablet (40 mg total) by mouth daily. 04/26/22  Yes [place], Maricela Bo, FNP  benzonatate (TESSALON) 100 MG capsule Take 1 capsule (100 mg total) by mouth 3 (three) times daily as needed for cough. 05/22/22  Yes Ghimire, Dante Gang, MD  Cinnamon 500 MG capsule Take 500 mg by mouth every morning.   Yes [provider]  dapagliflozin propanediol (FARXIGA) 10 MG TABS tablet Take 1 tablet (10 mg total) by mouth daily before breakfast. 06/28/22  Yes Larey Dresser, MD  ELIQUIS 5 MG TABS tablet TAKE 1 TABLET BY MOUTH TWICE A DAY Patient taking differently: Take 5 mg by mouth 2 (two) times daily. 02/18/22  Yes Larey Dresser, MD  finasteride (PROSCAR) 5 MG tablet Take 5 mg by mouth at bedtime.   Yes [provider]  gabapentin (NEURONTIN) 300 MG capsule Take 300 mg by mouth 3 (three) times daily. 04/04/22  Yes [provider]   LINZESS 72 MCG capsule Take 72 mcg by mouth every morning. 04/15/22  Yes [provider]  magnesium oxide (MAG-OX) 400 (240 Mg) MG tablet TAKE 1 TABLET BY MOUTH EVERY DAY Patient taking differently: Take 400 mg by mouth daily. 06/17/22  Yes Larey Dresser, MD  metoCLOPramide (REGLAN) 5 MG tablet Take 5 mg by mouth 3 (three) times daily before meals.   Yes [provider]  midodrine (PROAMATINE) 5 MG tablet Take 3 tablets (15 mg total) by mouth 3 (three) times daily. 06/20/22  Yes Larey Dresser, MD  Multiple Vitamin (MULTIVITAMIN WITH MINERALS) TABS tablet Take 1 tablet by mouth daily. 05/01/21  Yes Sheikh, Omair Latif, DO  OXYGEN Inhale 2 L into the lungs continuous.   Yes [provider]  pantoprazole (PROTONIX) 40 MG tablet Take 40 mg by mouth every morning.   Yes [provider]  Riociguat 2 MG TABS Take 2 mg by mouth in the morning, at noon, and at bedtime. 10/08/21  Yes Larey Dresser, MD  STIOLTO RESPIMAT 2.5-2.5 MCG/ACT AERS INHALE 2 PUFFS BY MOUTH INTO THE LUNGS DAILY 11/26/21  Yes [name], MD  torsemide (DEMADEX) 20 MG tablet Take  1 tablet (20 mg total) by mouth daily. 04/26/22  Yes Rafael Bihari, FNP    Past Medical History: Past Medical History:  Diagnosis Date   Arthritis    BPH (benign prostatic hyperplasia)    Chronic hiccups    COPD (chronic obstructive pulmonary disease) ([place])    Folliculitis    posterior scalp per office visit note of Dr Maudie Mercury 07/20/2014     GERD (gastroesophageal reflux disease)    Gout    Hypertension    Hypotension    NSTEMI (non-ST elevated myocardial infarction) ([place]) 03/19/2022   PE (pulmonary thromboembolism) ([place])    Phlebitis    right arm  at least 20 years ago    Pneumonia    hx of pneumonia as a child    Pulmonary embolism ([place]) 03/17/2022   Pulmonary hypertension ([place])    Severe sepsis ([place]) 03/16/2022   Sleep apnea     Past Surgical History: Past Surgical History:  Procedure Laterality  Date   BIOPSY  11/03/2020   Procedure: BIOPSY;  Surgeon: Carol Ada, MD;  Location: [facility];  Service: Endoscopy;;   BIOPSY  07/06/2021   Procedure: BIOPSY;  Surgeon: Carol Ada, MD;  Location: [facility];  Service: Endoscopy;;   bone removed from little toe right foot      CHOLECYSTECTOMY     ESOPHAGOGASTRODUODENOSCOPY (EGD) WITH PROPOFOL N/A 11/03/2020   Procedure: ESOPHAGOGASTRODUODENOSCOPY (EGD) WITH PROPOFOL;  Surgeon: Carol Ada, MD;  Location: [facility];  Service: Endoscopy;  Laterality: N/A;   ESOPHAGOGASTRODUODENOSCOPY (EGD) WITH PROPOFOL N/A 07/06/2021   Procedure: ESOPHAGOGASTRODUODENOSCOPY (EGD) WITH PROPOFOL;  Surgeon: Carol Ada, MD;  Location: [facility];  Service: Endoscopy;  Laterality: N/A;   pilonidal cyst removal      RIGHT HEART CATH N/A 12/13/2020   Procedure: RIGHT HEART CATH;  Surgeon: Larey Dresser, MD;  Location: [facility];  Service: Cardiovascular;  Laterality: N/A;   RIGHT/LEFT HEART CATH AND CORONARY ANGIOGRAPHY N/A 03/21/2022   Procedure: RIGHT/LEFT HEART CATH AND CORONARY ANGIOGRAPHY;  Surgeon: Larey Dresser, MD;  Location: [facility];  Service: Cardiovascular;  Laterality: N/A;   TOTAL KNEE ARTHROPLASTY Left 09/13/2014   Procedure: LEFT TOTAL KNEE ARTHROPLASTY;  Surgeon: Mauri Pole, MD;  Location: [facility];  Service: Orthopedics;  Laterality: Left;    Family History: Family History  Problem Relation Age of Onset   Heart attack Brother 67    Social History: Social History   Socioeconomic History   Marital status: Married    Spouse name: Joann   Number of children: 2   Years of education: 14   Highest education level: Not on file  Occupational History   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 Stopped working in  2022  Tobacco Use   Smoking status: Former    Packs/day: 1.00    Years: 50.00    Total pack years: 50.00    Types: Cigarettes    Start date: 6     Quit date: 08/19/2006    Years since quitting: 15.9   Smokeless tobacco: Never   Tobacco comments:    Former smoke 03/15/22  Vaping Use   Vaping Use: Never used  Substance and Sexual Activity   Alcohol use: Not Currently    Alcohol/week: 6.0 standard drinks of alcohol    Types: 6 Shots of liquor per week   Drug use: No   Sexual activity: Not on file  Other Topics Concern   Not  on file  Social History Narrative   Not on file   Social Determinants of Health   Financial Resource Strain: Not on file  Food Insecurity: Unknown (07/18/2022)   Hunger Vital Sign    Worried About Running Out of Food in the Last Year: Patient refused    [place] in the Last Year: Patient refused  Transportation Needs: No Transportation Needs (07/18/2022)   PRAPARE - Hydrologist (Medical): No    Lack of Transportation (Non-Medical): No  Physical Activity: Not on file  Stress: No Stress Concern Present (05/09/2021)   [place]    Feeling of Stress : Only a little  Social Connections: Not on file    Allergies:  Allergies  Allergen Reactions   Other Swelling    Beer- Swelling    Sunflower Oil Swelling   Sulfa Antibiotics Rash    Objective:    Vital Signs:   Temp:  [98 F (36.7 C)-98.4 F (36.9 C)] 98 F (36.7 C) (11/30 0840) Pulse Rate:  [77-99] 91 (11/30 0840) Resp:  [12-24] 16 (11/30 0840) BP: (88-125)/(47-91) 125/91 (11/30 0840) SpO2:  [90 %-98 %] 97 % (11/30 0840) Weight:  [86.4 kg] 86.4 kg (11/30 0449) Last BM Date : 07/15/22  Weight change: Filed Weights   07/17/22 0429 07/18/22 0048 07/18/22 0449  Weight: 87.5 kg 86.4 kg 86.4 kg    Intake/Output:   Intake/Output Summary (Last 24 hours) at 07/18/2022 1006 Last data filed at 07/18/2022 1031 Gross per 24 hour  Intake 1544.83 ml  Output 3800 ml  Net -2255.17 ml      Physical Exam    General:  elderly appearing.  No  respiratory difficulty HEENT: normal Neck: supple. JVD ~7 cm. Carotids 2+ bilat; no bruits. No lymphadenopathy or thyromegaly appreciated. Cor: PMI nondisplaced. Regular rate & rhythm. No rubs, gallops or murmurs. Lungs: diminished Abdomen: soft, nontender, nondistended. No hepatosplenomegaly. No bruits or masses. Good bowel sounds. Extremities: no cyanosis, clubbing, rash, edema  Neuro: alert & oriented x 3, cranial nerves grossly intact. moves all 4 extremities w/o difficulty. Affect pleasant.   Telemetry   NSR 70s (Personally reviewed)    EKG    ST 128 bpm RBB  Labs   Basic Metabolic Panel: Recent Labs  Lab 07/16/22 0138 07/16/22 0217 07/17/22 0704 07/18/22 0355  NA 136 139 136 138  K 4.0 3.6 4.1 4.1  CL 94*  --  97* 99  CO2 28  --  31 33*  GLUCOSE 145*  --  93 95  BUN 27*  --  18 15  CREATININE 2.19*  --  1.33* 1.21  CALCIUM 9.6  --  9.5 9.8  MG  --   --  1.6* 2.0    Liver Function Tests: Recent Labs  Lab 07/16/22 0138  AST 38  ALT 25  ALKPHOS 98  BILITOT 0.9  PROT 7.2  ALBUMIN 3.5   No results for input(s): "LIPASE", "AMYLASE" in the last 168 hours. No results for input(s): "AMMONIA" in the last 168 hours.  CBC: Recent Labs  Lab 07/16/22 0138 07/16/22 0217 07/17/22 0704 07/18/22 0355  WBC 10.6*  --  11.4* 7.1  NEUTROABS 8.5*  --   --   --   HGB 11.5* 11.9* 10.9* 10.4*  HCT 35.6* 35.0* 34.4* 31.9*  MCV 87.0  --  87.8 85.5  PLT 203  --  161 172    Cardiac Enzymes:  No results for input(s): "CKTOTAL", "CKMB", "CKMBINDEX", "TROPONINI" in the last 168 hours.  BNP: BNP (last 3 results) Recent Labs    05/21/22 0419 06/28/22 1012 07/16/22 0138  BNP 1,163.8* 124.0* 742.6*    ProBNP (last 3 results) No results for input(s): "PROBNP" in the last 8760 hours.   CBG: Recent Labs  Lab 07/17/22 1536 07/17/22 1922 07/17/22 2345 07/18/22 0355 07/18/22 0813  GLUCAP 110* 124* 121* 91 86    Coagulation Studies: Recent Labs     07/16/22 0138  LABPROT 21.9*  INR 1.9*     Imaging   No results found.   Medications:     Current Medications:  apixaban  5 mg Oral BID   arformoterol  15 mcg Nebulization BID   atorvastatin  40 mg Oral Daily   bethanechol  5 mg Oral TID   Chlorhexidine Gluconate Cloth  6 each Topical Daily   feeding supplement  237 mL Oral Q24H   finasteride  5 mg Oral QHS   gabapentin  300 mg Oral TID   midodrine  15 mg Oral TID   pantoprazole  40 mg Oral q morning   Riociguat  2 mg Oral 3 times per day    Infusions:  sodium chloride Stopped (07/16/22 1715)   ceFEPime (MAXIPIME) IV 2 g (07/18/22 1004)      Patient Profile   Joshua [name] is an 86y.o. AA male with history of HFPEF, prior PE, COPD, chronic hypercapnic respiratory failure on 2L at home, pulmonary hypertension, BPH, CKD, and OSA  Assessment/Plan   1. Acute on chronic respiratory failure - Recently discharged 10/4 after being treated with steroids and antivirals for covid-19 - Sepsis 2/2 PNA vs UTI - Abx per PCCM, on cefepime  - Stable on 3L [place] 2. Chronic HFpEF/RV failure: Echo (2/22) with EF 55-60%, IV septum flattened, severe RV enlargement, severely decreased RV function, PASP 57 mmHg.  He has severe RV failure at baseline.  Echo this admit 8/23 >>RV severely reduced, severely enlarged, RVSP 50, LVEF 60-65%, no RWMA, mild TR, trivial MR.  RHC 8/23 showed normal/low filling pressures and mild PAH. - Volume stable on exam.  - Continue midodrine 15 mg tid. Would avoid fludrocortisone. - Restart dapagliflozin 10 mg daily.    - On Torsemide 68m daily at home, -3.8 L yesterday w/o po diuretic, may not need it daily and only PRN, will continue to monitor volume status 2. Pulmonary HTN: PAH noted on 4/22 RHC with PVR 6.1 WU.  This appears to be multifactorial with OSA, severe emphysema, and a suspected chronic PE involving the right middle lobe (group 3 and group 4 PH). Given the suspected mixed etiology with only 1 area of  chronic thromboembolism (right middle lobe) as well as age, do not think that pulmonary thromboendarterectomy would be indicated.  Rheumatologic serologic workup was negative.  PFTs showed severe obstruction and moderate restriction, suggesting significant COPD. [place]8/23 showed mild pulmonary hypertension with PVR 3.4 WU.  - Continue riociguat.  - Holding off Tyvaso for now, suspect large group 3 PH component and only mildly elevated PA pressure on 8/23 RHC.  3. CKD III:  - Cr stable 1.21 today 4. [facility] - HsTrop 146>230>220>223 on admission. Suspect demand ischemia - Continue statin.  5. OSA: Moderate OSA on sleep study. He refused CPAP. 6. Emphysema: Prior smoker.  Emphysema on CT and severe obstruction on PFTs. On 2L home oxygen. Currently on 3L post working with PT.  7.  Chronic PE: Diagnosed by V/Q scan.  No abnormal bleeding. He has a probable chronic RLE DVT.   - Continue Eliquis.  8. Hiatial hernia w/ esophagitis: Has significant GERD. He follows with Dr. Benson Norway. - Continue PPI.  9. Nutcracker SMA: Compresses duodenum 10. Urinary retention - started on urecholine, continue finasteride  Length of Stay: [place] AGACNP-BC  07/18/2022, 10:06 AM  Advanced Heart Failure Team Pager [phone] (M-F; 7a - 5p)  Please contact [facility] for night-coverage after hours (4p -7a ) and weekends on [email or web address]  Patient seen with NP, agree with the above note.   Patient presented with likely PNA and septic shock, required IV fluid and currently on cefepime.  He seems to have recovered nicely, he is now on 3L home oxygen (is on 2L chronically at home).  SBP in 90s at times but this is unchanged from his baseline.  He has had some orthostatic symptoms.  He is on midodrine 15 mg tid, his home regimen.   General: NAD Neck: No JVD, no thyromegaly or thyroid nodule.  Lungs: Decreased at bases.  CV: Nondisplaced PMI.  Heart regular S1/S2, no S3/S4, no murmur.  No  peripheral edema.   Abdomen: Soft, nontender, no hepatosplenomegaly, no distention.  Skin: Intact without lesions or rashes.  Neurologic: Alert and oriented x 3.  Psych: Normal affect. Extremities: No clubbing or cyanosis.  HEENT: Normal.   Continue cefepime per primary team.   Volume status looks ok on exam.  He takes torsemide 20 mg daily at home, think we can hold this today. Will restart his dapagliflozin.  Probably will need to restart torsemide perhaps qod at discharge.   Continue midodrine for low BP in setting of chronic RV failure.  I will also have him wear graded compression stockings.   Loralie Champagne 07/18/2022 2:03 PM

## 2022-07-18 NOTE — Evaluation (Signed)
Physical Therapy Evaluation Patient Details Name: Joshua [name] MRN: [id number] DOB: 1936/03/28 Today's Date: 07/18/2022  History of Present Illness  The pt is an 86 yo male presenting 11/28 after a fall at home, hypoxic to 69% on RA upon arrival of EMS. Work up concerning for PNA, hospitalization complicated by hypotension and shock. PMH includes: HTN, CAD, chronic thrombolytic pulmonary hypertension, PE on anticoagulation, CHF, COPD.   Clinical Impression  Pt in bed upon arrival of PT, agreeable to evaluation at this time. Prior to admission the pt was independent with mobility without use of DME, reports no other falls outside of fall PTA. The pt lives with his spouse and is typically able to drive and complete community ambulation as needed but also describes fairly sedentary lifestyle. The pt now presents with limitations in functional mobility, strength, power, stability, and activity tolerance due to above dx, and will continue to benefit from skilled PT to address these deficits. The pt was able to complete bed mobility without physical assist, but did require increased time and HOB elevated. He also required increased attempts and use of momentum and UE support to power up to standing, but was eventually able to complete with minG for safety only. The pt did have drop in BP with change in position (see below) which limited ambulation to within-room mobility, but pt denies sx with any change in BP or any activity at this time. Will continue to benefit from skilled PT acutely to progress independence with transfers and ambulation distance while BP and SpO2 stabilize.   VITALS:  - sitting EOB - BP: 91/56 (68); - standing - BP: 81/43 (54);  - standing after 3 min - BP: 77/47 (54); - sitting EOB - 90/57 (66); HR: 100bpm     SpO2 on 2L at rest: 89-94% SpO2 on 2L with ambulation: 85% SpO2 on 3L with ambulation: 93%   Recommendations for follow up therapy are one component of a  multi-disciplinary discharge planning process, led by the attending physician.  Recommendations may be updated based on patient status, additional functional criteria and insurance authorization.  Follow Up Recommendations Home health PT (vs no PT pending progress)      Assistance Recommended at Discharge Frequent or constant Supervision/Assistance  Patient can return home with the following  A little help with walking and/or transfers;A little help with bathing/dressing/bathroom;Assistance with cooking/housework;Assist for transportation;Help with stairs or ramp for entrance    Equipment Recommendations None recommended by PT  Recommendations for Other Services       Functional Status Assessment Patient has had a recent decline in their functional status and demonstrates the ability to make significant improvements in function in a reasonable and predictable amount of time.     Precautions / Restrictions Precautions Precautions: Fall Precaution Comments: watch BP, SBP >85, SpO2 88-92% Restrictions Weight Bearing Restrictions: No      Mobility  Bed Mobility Overal bed mobility: Needs Assistance Bed Mobility: Supine to Sit     Supine to sit: Min guard     General bed mobility comments: minG with HOB elevated, no physical assist but pt using increased time and bed rails to come to sitting EOB    Transfers Overall transfer level: Needs assistance Equipment used: None Transfers: Sit to/from Stand Sit to Stand: Min guard           General transfer comment: pt needing multilple attempts to rise from EOB, but then able to stand without physical assist. miNG for safety.  Ambulation/Gait Ambulation/Gait assistance: Min guard Gait Distance (Feet): 12 Feet Assistive device: None Gait Pattern/deviations: Step-through pattern, Decreased stride length Gait velocity: decreased Gait velocity interpretation: <1.31 ft/sec, indicative of household ambulator   General Gait  Details: limited by soft BP, pt able to ambulate in room without sx or need for UE support. No overt LOB, slowed speed used to navigate around obstacles in room     Balance Overall balance assessment: Needs assistance Sitting-balance support: No upper extremity supported Sitting balance-Leahy Scale: Good     Standing balance support: No upper extremity supported Standing balance-Leahy Scale: Fair Standing balance comment: poor tolerance for challenge but no overt LOB with ambulation without UE support                             Pertinent Vitals/Pain Pain Assessment Pain Assessment: No/denies pain    Home Living Family/patient expects to be discharged to:: Private residence Living Arrangements: Spouse/significant other Available Help at Discharge: Family;Available 24 hours/day Type of Home: House Home Access: Stairs to enter Entrance Stairs-Rails: Right Entrance Stairs-Number of Steps: 6 Alternate Level Stairs-Number of Steps: 6+landing+6+landing+4 Home Layout: Two level;Bed/bath upstairs Home Equipment: Rolling Walker (2 wheels);Cane - single point;BSC/3in1;Shower seat Additional Comments: supplemental O2 use PRN; 2L/min at baseline, occasionally 3L if doing intense walking/activity    Prior Function Prior Level of Function : Independent/Modified Independent;Driving             Mobility Comments: pt reports independent without DME, no falls other than one PTA (unable to get up on his own). reports mostly sedentary during day, watching TV ADLs Comments: pt reports he can still drive and run errands, goes to [place]   Dominant Hand: Right    Extremity/Trunk Assessment   Upper Extremity Assessment Upper Extremity Assessment: Defer to OT evaluation    Lower Extremity Assessment Lower Extremity Assessment: Generalized weakness    Cervical / Trunk Assessment Cervical / Trunk Assessment: Normal  Communication   Communication:  No difficulties  Cognition Arousal/Alertness: Awake/alert Behavior During Therapy: WFL for tasks assessed/performed Overall Cognitive Status: Within Functional Limits for tasks assessed                                 General Comments: pt with slightly slowed responses at times but able to answer all questions and joke appropriately. follows all cues well in session        General Comments General comments (skin integrity, edema, etc.): Soft BP with change in position, low of 77/47 (54) after standing for 3 min pt denies sx. SpO2 89-94% on 2L at rest, low of 85% on 2L with gait and improved to 93% with 3L        Assessment/Plan    PT Assessment Patient needs continued PT services  PT Problem List Decreased strength;Decreased activity tolerance;Decreased mobility;Decreased balance       PT Treatment Interventions Gait training;DME instruction;Stair training;Functional mobility training;Therapeutic activities;Therapeutic exercise;Balance training;Patient/family education    PT Goals (Current goals can be found in the Care Plan section)  Acute Rehab PT Goals Patient Stated Goal: return home PT Goal Formulation: With patient Time For Goal Achievement: 08/01/22 Potential to Achieve Goals: Good    Frequency Min 3X/week     Co-evaluation PT/OT/SLP Co-Evaluation/Treatment: Yes Reason for Co-Treatment: For patient/therapist safety;To address functional/ADL transfers PT goals addressed during session: Mobility/safety  with mobility;Balance;Strengthening/ROM         AM-PAC PT "6 Clicks" Mobility  Outcome Measure Help needed turning from your back to your side while in a flat bed without using bedrails?: A Little Help needed moving from lying on your back to sitting on the side of a flat bed without using bedrails?: A Little Help needed moving to and from a bed to a chair (including a wheelchair)?: A Little Help needed standing up from a chair using your arms (e.g.,  wheelchair or bedside chair)?: A Little Help needed to walk in hospital room?: Total (<20 ft due to soft BP) Help needed climbing 3-5 steps with a railing? : A Lot 6 Click Score: 15    End of Session Equipment Utilized During Treatment: Gait belt;Oxygen Activity Tolerance: Patient tolerated treatment well;Treatment limited secondary to medical complications (Comment) (soft BP) Patient left: in chair;with call bell/phone within reach;with chair alarm set Nurse Communication: Mobility status PT Visit Diagnosis: Other abnormalities of gait and mobility (R26.89)    Time: 9144-4584 PT Time Calculation (min) (ACUTE ONLY): 22 min   Charges:   PT Evaluation $PT Eval Low Complexity: 1 Low          [place] Carbo, PT, DPT   Acute Rehabilitation Department  Sandra Cockayne 07/18/2022, 11:05 AM

## 2022-07-19 ENCOUNTER — Other Ambulatory Visit (HOSPITAL_COMMUNITY): Payer: Self-pay

## 2022-07-19 DIAGNOSIS — A419 Sepsis, unspecified organism: Secondary | ICD-10-CM | POA: Diagnosis not present

## 2022-07-19 DIAGNOSIS — R6520 Severe sepsis without septic shock: Secondary | ICD-10-CM | POA: Diagnosis not present

## 2022-07-19 DIAGNOSIS — I50810 Right heart failure, unspecified: Secondary | ICD-10-CM | POA: Diagnosis not present

## 2022-07-19 LAB — MAGNESIUM: Magnesium: 1.9 mg/dL (ref 1.7–2.4)

## 2022-07-19 LAB — CBC WITH DIFFERENTIAL/PLATELET
Abs Immature Granulocytes: 0.02 10*3/uL (ref 0.00–0.07)
Basophils Absolute: 0 10*3/uL (ref 0.0–0.1)
Basophils Relative: 0 %
Eosinophils Absolute: 0.5 10*3/uL (ref 0.0–0.5)
Eosinophils Relative: 8 %
HCT: 33.7 % — ABNORMAL LOW (ref 39.0–52.0)
Hemoglobin: 11.1 g/dL — ABNORMAL LOW (ref 13.0–17.0)
Immature Granulocytes: 0 %
Lymphocytes Relative: 10 %
Lymphs Abs: 0.6 10*3/uL — ABNORMAL LOW (ref 0.7–4.0)
MCH: 28 pg (ref 26.0–34.0)
MCHC: 32.9 g/dL (ref 30.0–36.0)
MCV: 84.9 fL (ref 80.0–100.0)
Monocytes Absolute: 0.7 10*3/uL (ref 0.1–1.0)
Monocytes Relative: 11 %
Neutro Abs: 4.5 10*3/uL (ref 1.7–7.7)
Neutrophils Relative %: 71 %
Platelets: 186 10*3/uL (ref 150–400)
RBC: 3.97 MIL/uL — ABNORMAL LOW (ref 4.22–5.81)
RDW: 15.6 % — ABNORMAL HIGH (ref 11.5–15.5)
WBC: 6.4 10*3/uL (ref 4.0–10.5)
nRBC: 0 % (ref 0.0–0.2)

## 2022-07-19 LAB — COMPREHENSIVE METABOLIC PANEL
ALT: 17 U/L (ref 0–44)
AST: 21 U/L (ref 15–41)
Albumin: 2.6 g/dL — ABNORMAL LOW (ref 3.5–5.0)
Alkaline Phosphatase: 75 U/L (ref 38–126)
Anion gap: 10 (ref 5–15)
BUN: 15 mg/dL (ref 8–23)
CO2: 33 mmol/L — ABNORMAL HIGH (ref 22–32)
Calcium: 10 mg/dL (ref 8.9–10.3)
Chloride: 94 mmol/L — ABNORMAL LOW (ref 98–111)
Creatinine, Ser: 1.17 mg/dL (ref 0.61–1.24)
GFR, Estimated: >60 mL/min (ref >60)
Glucose, Bld: 103 mg/dL — ABNORMAL HIGH (ref 70–99)
Potassium: 4.1 mmol/L (ref 3.5–5.1)
Sodium: 137 mmol/L (ref 135–145)
Total Bilirubin: 0.7 mg/dL (ref 0.3–1.2)
Total Protein: 6.3 g/dL — ABNORMAL LOW (ref 6.5–8.1)

## 2022-07-19 LAB — PHOSPHORUS: Phosphorus: 2.9 mg/dL (ref 2.5–4.6)

## 2022-07-19 MED ORDER — MAGNESIUM SULFATE 2 GM/50ML IV SOLN
2.0000 g | Freq: Once | INTRAVENOUS | Status: AC
  Administered 2022-07-19: 2 g via INTRAVENOUS
  Filled 2022-07-19: qty 50

## 2022-07-19 MED ORDER — MENTHOL 3 MG MT LOZG: 1.0000 | LOZENGE | OROMUCOSAL | Status: DC | PRN

## 2022-07-19 MED ORDER — BENZONATATE 100 MG PO CAPS
200.0000 mg | ORAL_CAPSULE | Freq: Three times a day (TID) | ORAL | Status: DC | PRN
  Administered 2022-07-19: 200 mg via ORAL
  Filled 2022-07-19: qty 2

## 2022-07-19 MED ORDER — TORSEMIDE 20 MG PO TABS: 20.0000 mg | ORAL_TABLET | Freq: Every day | ORAL | Status: DC

## 2022-07-19 MED ORDER — AMOXICILLIN-POT CLAVULANATE 875-125 MG PO TABS
1.0000 | ORAL_TABLET | Freq: Two times a day (BID) | ORAL | 0 refills | Status: AC
  Filled 2022-07-19: qty 4, 2d supply, fill #0

## 2022-07-19 NOTE — Progress Notes (Signed)
Patient's personal meds were picked up from the pharmacy by Tonna Boehringer RN and returned to the patient. The Receipt for patient's home medications is on the patient's chart. She reviewed the discharge instructions.

## 2022-07-19 NOTE — Progress Notes (Signed)
Nurse requested Mobility Specialist to perform oxygen saturation test with pt which includes removing pt from oxygen both at rest and while ambulating.  Below are the results from that testing.     Patient Saturations on Room Air at Rest = spO2 86%  Patient Saturations on Room Air while Ambulating = sp02 84% .  Rested and performed pursed lip breathing for 1 minute with sp02 at 86%.  Patient Saturations on 3 Liters of oxygen while Ambulating = sp02 93%  At end of testing pt left in room on 3  Liters of oxygen.  Reported results to nurse.

## 2022-07-19 NOTE — Progress Notes (Signed)
Mobility Specialist - Progress Note   07/19/22 1457  Mobility  Activity Ambulated with assistance in hallway  Level of Assistance Standby assist, set-up cues, supervision of patient - no hands on  Assistive Device None  Distance Ambulated (ft) 160 ft  Activity Response Tolerated well  Mobility Referral Yes  $Mobility charge 1 Mobility   Pt received in bed and agreeable to mobility. No complaints throughout. Pt was returned to EOB with all needs met.  Franki Monte  Mobility Specialist Please contact via Solicitor or Rehab office at [phone]

## 2022-07-19 NOTE — TOC Initial Note (Signed)
Transition of Care [facility]) - Initial/Assessment Note    Patient Details  Name: Joshua [name] MRN: [id number] Date of Birth: August 19, 1936  Transition of Care [facility]) CM/SW Contact:    Erenest Rasher, RN Phone Number: [phone] 07/19/2022, 3:32 PM  Clinical Narrative:                 HF TOC CM spoke to pt and states he has oxygen with [employer]. Will have wife bring his portable at dc. Pt states he had HH with [facility]. Contacted [facility] rep, Caryl Pina with new referral. Accepted referral. Pt states he sent NiV back, it was too cumbersome. States he does not want another device. Has scale at home and states he does daily weights.   Expected Discharge Plan: [place] Barriers to Discharge: No Barriers Identified   Patient Goals and CMS Choice Patient states their goals for this hospitalization and ongoing recovery are:: wants to remain independent CMS Medicare.gov Compare Post Acute Care list provided to:: Patient Choice offered to / list presented to : Patient  Expected Discharge Plan and Services Expected Discharge Plan: [place] In-house Referral: Clinical Social Work Discharge Planning Services: CM Consult Post Acute Care Choice: [place] arrangements for the past 2 months: [place] Expected Discharge Date: 07/19/22                         HH Arranged: PT, OT [place] Agency: [place] ([place]) Date [place]: 07/19/22 Time [place]: [place] Representative spoke with at [place]: [place] Arrangements/Services Living arrangements for the past 2 months: [place] Lives with:: Spouse Patient language and need for interpreter reviewed:: Yes Do you feel safe going back to the place where you live?: Yes      Need for Family Participation in Patient Care: No (Comment) Care giver support system in place?: Yes (comment)   Criminal Activity/Legal Involvement Pertinent  to Current Situation/Hospitalization: No - Comment as needed  Activities of Daily Living Home Assistive Devices/Equipment: Eyeglasses ADL Screening (condition at time of admission) Patient's cognitive ability adequate to safely complete daily activities?: Yes Is the patient deaf or have difficulty hearing?: No Does the patient have difficulty seeing, even when wearing glasses/contacts?: No Does the patient have difficulty concentrating, remembering, or making decisions?: No Patient able to express need for assistance with ADLs?: Yes Does the patient have difficulty dressing or bathing?: No Independently performs ADLs?: Yes (appropriate for developmental age) Does the patient have difficulty walking or climbing stairs?: Yes Weakness of Legs: None Weakness of Arms/Hands: None  Permission Sought/Granted Permission sought to share information with : Case Manager, PCP, Family Supports Permission granted to share information with : Yes, Verbal Permission Granted  Share Information with NAME: Alwyn Cordner  Permission granted to share info w AGENCY: [place] granted to share info w Relationship: wife  Permission granted to share info w Contact Information: [phone]  Emotional Assessment   Attitude/Demeanor/Rapport: Engaged Affect (typically observed): Accepting Orientation: : Oriented to Self, Oriented to Place, Oriented to  Time, Oriented to Situation   Psych Involvement: No (comment)  Admission diagnosis:  Elevated troponin [R79.89] Acute kidney injury (nontraumatic) (HCC) [N17.9] Elevated brain natriuretic peptide (BNP) level [R79.89] Normochromic normocytic anemia [D64.9] Sepsis ([place]) [A41.9] Sepsis due to undetermined organism [facility]) [A41.9] Chronic respiratory failure with hypoxia and hypercapnia ([place]) [J96.11, J96.12] Community acquired pneumonia of left  lower lobe of lung [J18.9] Patient Active Problem List   Diagnosis Date Noted   Sepsis ([place]) 07/16/2022    COVID-19 virus infection 05/20/2022   Hypotension 05/20/2022   Stage 3b chronic kidney disease (CKD) ([place]) - baseline SCr 1.5-1.8 05/20/2022   COPD with emphysema ([place]) 03/19/2022   Dyslipidemia    Chronic diastolic CHF (congestive heart failure) ([place])    Protein-calorie malnutrition, severe ([place]) 12/26/2021   Palliative care by specialist    Abnormal gait 10/05/2021   Gastroesophageal reflux disease without esophagitis 10/05/2021   Hiatal hernia 10/05/2021   Hiccoughs 10/05/2021   Oxygen dependent 10/05/2021   Prediabetes 10/05/2021   Pure hypercholesterolemia 10/05/2021   Community acquired pneumonia of left lower lobe of lung 10/04/2021   Gout 10/04/2021   Asymmetrical sensorineural hearing loss 08/29/2021   Nasal dryness 08/29/2021   Ear stuffiness, bilateral 05/09/2021   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   Right heart failure with reduced right ventricular function ([place]) 04/12/2021   OSA (obstructive sleep apnea) 12/07/2020   Aortic atherosclerosis ([place]) 12/07/2020   Pseudophakia, both eyes 06/07/2019   Pulmonary hypertension ([place]) 09/29/2017   Benign prostatic hyperplasia with lower urinary tract symptoms 08/25/2017   Demand ischemia of myocardium 08/25/2017   Arthritis 08/24/2017   Hypertension 02/16/2015   Chronic obstructive pulmonary disease, unspecified ([place]) 12/12/2014   Personal history of pulmonary embolism 10/10/2014   Chronic respiratory failure with hypoxia ([place]) - on home O2 @ 2 L/min 10/10/2014   S/P knee replacement 09/13/2014   PCP:  [name], DO Pharmacy:   [id number] [address]Phone: [phone]: [phone]Phone: [phone]: [phone]    Social Determinants of Health (SDOH) Interventions Housing Interventions: Patient  Refused  Readmission Risk Interventions    10/05/2021    3:15 PM  Readmission Risk Prevention Plan  Transportation Screening Complete  Medication [place] ([place] Complete  PCP or Specialist appointment within 3-5 days of discharge Complete  HRI or [place]  SW Recovery Care/Counseling Consult Complete  Palliative Care Screening Not ADanteNot Applicable

## 2022-07-19 NOTE — Discharge Summary (Signed)
Physician Discharge Summary  Joshua [name] HKF:[id number] DOB: 1936/04/27 DOA: 07/16/2022  PCP: Joshua Morning, DO  Admit date: 07/16/2022 Discharge date: 07/19/2022  Time spent: 40 minutes  Recommendations for Outpatient Follow-up:  Follow outpatient CBC/CMP  Follow O2 needs outpatient, wean to 2 L as tolerated Repeat CXR in 3-4 weeks  Follow with cards outpatient  Discharge Diagnoses:  Principal Problem:   Sepsis [facility]) Active Problems:   Community acquired pneumonia of left lower lobe of lung   Discharge Condition: stable  Diet recommendation: heart healthy  Filed Weights   07/18/22 0048 07/18/22 0449 07/19/22 0455  Weight: 86.4 kg 86.4 kg 85.8 kg    History of present illness:  86 year old gentleman with history of COPD, chronic hypercapnic and hypoxic respiratory failure, PH, CTEPH on eliquis and riocuguiat, and OSA (non-compliant with CPAP). He presented with sepsis likely from pneumonia. Admitted to ICU due to low-dose pressor requirement.    11/30: [facility] assumed care  Stable for discharge on 12/1.  He's needing 3 L to maintain sats over 90%.  Follow outpatient.  Will send with another 2 days abx to complete 7 day course.  Follow with cardiology outpatient.  Hospital Course:  Assessment and Plan: Severe sepsis 2/2 PNA Acute on chronic hypercapnic and hypoxic respiratory failure Hypotension Per PCCM service's discussion with Joshua [name], patient's BP typically runs low. His SBP goal is >85 (as long as not dizzy).  Will continue midodrine for now. His urine cultures are negative. Blood cultures no growth for past day. Given his SBP typically runs low, question whether shock present on arrival.  - CXR with new airspace opacities in the L mid and lower lung concerning for pneumonia vs asymmetric edema -continue cefepime for empiric coverage -> will discharge on augmentin for another 2 days for 7 day course -continue home midodrine - BP much improved today -requiring 3 L  O2 at this time to maintain sats over 90%, will adjust and follow outpatient  -f/u blood cultures NGTD x2   Pulmonary HTN CTEPH -continue home riociguat -appreciate advanced HF assistance -continue bronchodilator -resumed home eliquis -Dr. Sylvester [name] notes torsemide daily at discharge -continue O2 to maintain sats >90%, he's currently needing 3 L which is higher than his baseline 2 L   Acute on Chronic Urinary Retention -continue finasteride -he I&O caths at home, can resume this  AKI on CKD - improved Unclear severity of CKD. Per chart review, creatinine typically remains between 1.3-1.5. His AKI has improved with fluid resuscitation -avoid nephrotoxic medications -trend kidney function -monitor UOP   Hypomagnesemia Improved, follow    Elevated troponins Likely due to demand ischemia. Troponins peaked at 230, follow with cardiology     Procedures: none   Consultations: PCCM pulm  Discharge Exam: Vitals:   07/19/22 0824 07/19/22 1225  BP:  120/68  Pulse: 80 80  Resp: 18 20  Temp:  98.3 F (36.8 C)  SpO2: 98% 98%   No complaints Doesn't feel 100%, but feels better than he did when he came in  General: No acute distress. Cardiovascular: Heart sounds show Joshua [name] regular rate, and rhythm. Lungs: Clear to auscultation bilaterally  Abdomen: Soft, nontender, nondistended  Neurological: Alert and oriented 3. Moves all extremities 4. Cranial nerves II through XII grossly intact. Extremities: No clubbing or cyanosis. No edema.  Discharge Instructions   Discharge Instructions     Diet - low sodium heart healthy   Complete by: As directed    Discharge instructions   Complete by: As  directed    You were seen in the hospital for pneumonia.    You've improved with antibiotics.  I'll send you home with another 2 days.   You should have repeat chest imaging in Joshua [name] few weeks with your outpatient doctor.  You're currently requiring 3 L oxygen by nasal cannula.  Increase  your home oxygen to 3 L for now, you can follow with your outpatient provider to determine if this can be weaned back to 2 L.  Resume your torsemide tomorrow.    Please follow with cardiology as an outpatient.    Return for new, recurrent, or worsening symptoms.  Please ask your PCP to request records from this hospitalization so they know what was done and what the next steps will be.   For home use only DME oxygen   Complete by: As directed    Required 3 L O2 to maintain sats greater than 90   Length of Need: 12 Months   Liters per Minute: 3   Frequency: Continuous (stationary and portable oxygen unit needed)   Oxygen conserving device: Yes   Oxygen delivery system: Gas   Increase activity slowly   Complete by: As directed       Allergies as of 07/19/2022       Reactions   Other Swelling   Beer- Swelling    Sunflower Oil Swelling   Sulfa Antibiotics Rash        Medication List     TAKE these medications    albuterol 108 (90 Base) MCG/ACT inhaler Commonly known as: VENTOLIN HFA Inhale 1 puff into the lungs every 4 (four) hours as needed for shortness of breath.   allopurinol 100 MG tablet Commonly known as: ZYLOPRIM Take 100 mg by mouth every [name].   amoxicillin-clavulanate 875-125 MG tablet Commonly known as: AUGMENTIN Take 1 tablet by mouth 2 (two) times daily for 2 days.   atorvastatin 40 MG tablet Commonly known as: LIPITOR Take 1 tablet (40 mg total) by mouth daily.   benzonatate 100 MG capsule Commonly known as: TESSALON Take 1 capsule (100 mg total) by mouth 3 (three) times daily as needed for cough.   Cinnamon 500 MG capsule Take 500 mg by mouth every [name].   dapagliflozin propanediol 10 MG Tabs tablet Commonly known as: Farxiga Take 1 tablet (10 mg total) by mouth daily before breakfast.   Eliquis 5 MG Tabs tablet Generic drug: apixaban TAKE 1 TABLET BY MOUTH TWICE Joshua [name] DAY What changed: how much to take   finasteride 5 MG  tablet Commonly known as: PROSCAR Take 5 mg by mouth at bedtime.   gabapentin 300 MG capsule Commonly known as: NEURONTIN Take 300 mg by mouth 3 (three) times daily.   Linzess 72 MCG capsule Generic drug: linaclotide Take 72 mcg by mouth every [name].   magnesium oxide 400 (240 Mg) MG tablet Commonly known as: MAG-OX TAKE 1 TABLET BY MOUTH EVERY DAY   metoCLOPramide 5 MG tablet Commonly known as: REGLAN Take 5 mg by mouth 3 (three) times daily before meals.   midodrine 5 MG tablet Commonly known as: PROAMATINE Take 3 tablets (15 mg total) by mouth 3 (three) times daily.   multivitamin with minerals Tabs tablet Take 1 tablet by mouth daily.   OXYGEN Inhale 2 L into the lungs continuous.   pantoprazole 40 MG tablet Commonly known as: PROTONIX Take 40 mg by mouth every [name].   Riociguat 2 MG Tabs Take 2 mg by mouth in the  [name], at noon, and at bedtime.   Stiolto Respimat 2.5-2.5 MCG/ACT Aers Generic drug: Tiotropium Bromide-Olodaterol INHALE 2 PUFFS BY MOUTH INTO THE LUNGS DAILY   torsemide 20 MG tablet Commonly known as: DEMADEX Take 1 tablet (20 mg total) by mouth daily.               Durable Medical Equipment  (From admission, onward)           Start     Ordered   07/19/22 0000  For home use only DME oxygen       Comments: Required 3 L O2 to maintain sats greater than 90  Question Answer Comment  Length of Need 12 Months   Liters per Minute 3   Frequency Continuous (stationary and portable oxygen unit needed)   Oxygen conserving device Yes   Oxygen delivery system Gas      07/19/22 1511           Allergies  Allergen Reactions   Other Swelling    Beer- Swelling    Sunflower Oil Swelling   Sulfa Antibiotics Rash    Follow-up Information     [facility] Follow up on 08/09/2022.   Specialty: Cardiology Why: Advanced [facility] Failure [facility] 10 am Entrance C, Free Valet Parking Please  bring all meds to appointment. Contact information: [address] [phone] mc [address] [place]        Joshua Morning, DO Follow up.   Specialty: Family Medicine Why: call for Caydan Mctavish follow up appt within 1 week or so Contact information: [address] [place] [address] [phone]                  The results of significant diagnostics from this hospitalization (including imaging, microbiology, ancillary and laboratory) are listed below for reference.    Significant Diagnostic Studies: DG Chest Port 1 View  Result Date: 07/16/2022 CLINICAL DATA:  Questionable sepsis evaluate for abnormality EXAM: PORTABLE CHEST 1 VIEW COMPARISON:  Radiographs 05/20/2022 FINDINGS: Stable cardiomegaly. Aortic atherosclerotic calcification. Prominent central pulmonary arteries. Patchy airspace opacities in the left mid and lower lung are new since 05/20/2022. No definite pleural effusion. No pneumothorax. No acute osseous abnormality. IMPRESSION: New airspace opacities in the left mid and lower lung suspicious for pneumonia versus asymmetric edema. Cardiomegaly. Electronically Signed   By: Placido Sou M.D.   On: 07/16/2022 02:00    Microbiology: Recent Results (from the past 240 hour(s))  Urine Culture     Status: None   Collection Time: 07/16/22  1:38 AM   Specimen: In/Out Cath Urine  Result Value Ref Range Status   Specimen Description IN/OUT CATH URINE  Final   Special Requests NONE  Final   Culture   Final    NO GROWTH Performed at [facility], [address] [place], [place] [address]    Report Status 07/17/2022 FINAL  Final  Blood Culture (routine x 2)     Status: None (Preliminary result)   Collection Time: 07/16/22  1:45 AM   Specimen: BLOOD RIGHT FOREARM  Result Value Ref Range Status   Specimen Description BLOOD RIGHT FOREARM  Final   Special Requests   Final    BOTTLES DRAWN AEROBIC AND ANAEROBIC Blood  Culture adequate volume   Culture   Final    NO GROWTH 3 DAYS Performed at [facility], [place] [address] [place],  [address]  Report Status PENDING  Incomplete  Resp Panel by RT-PCR (Flu Virlan Kempker&B, Covid)     Status: None   Collection Time: 07/16/22  1:45 AM   Specimen: Nasal Swab  Result Value Ref Range Status   SARS Coronavirus 2 by RT PCR NEGATIVE NEGATIVE Final    Comment: (NOTE) SARS-CoV-2 target nucleic acids are NOT DETECTED.  The SARS-CoV-2 RNA is generally detectable in upper respiratory specimens during the acute phase of infection. The lowest concentration of SARS-CoV-2 viral copies this assay can detect is 138 copies/mL. Oakley Kossman negative result does not preclude SARS-Cov-2 infection and should not be used as the sole basis for treatment or other patient management decisions. Elizabella Nolet negative result may occur with  improper specimen collection/handling, submission of specimen other than nasopharyngeal swab, presence of viral mutation(s) within the areas targeted by this assay, and inadequate number of viral copies(<138 copies/mL). Jaden Abreu negative result must be combined with clinical observations, patient history, and epidemiological information. The expected result is Negative.  Fact Sheet for Patients:  EntrepreneurPulse.com.au  Fact Sheet for Healthcare Providers:  IncredibleEmployment.be  This test is no t yet approved or cleared by the [place] FDA and  has been authorized for detection and/or diagnosis of SARS-CoV-2 by FDA under an Emergency Use Authorization (EUA). This EUA will remain  in effect (meaning this test can be used) for the duration of the COVID-19 declaration under Section 564(b)(1) of the Act, 21 U.S.C.section 360bbb-3(b)(1), unless the authorization is terminated  or revoked sooner.       Influenza Diani Jillson by PCR NEGATIVE NEGATIVE Final   Influenza B by PCR NEGATIVE NEGATIVE Final    Comment: (NOTE) The Xpert  Xpress SARS-CoV-2/FLU/RSV plus assay is intended as an aid in the diagnosis of influenza from Nasopharyngeal swab specimens and should not be used as Abdon Petrosky sole basis for treatment. Nasal washings and aspirates are unacceptable for Xpert Xpress SARS-CoV-2/FLU/RSV testing.  Fact Sheet for Patients: EntrepreneurPulse.com.au  Fact Sheet for Healthcare Providers: IncredibleEmployment.be  This test is not yet approved or cleared by the [place] FDA and has been authorized for detection and/or diagnosis of SARS-CoV-2 by FDA under an Emergency Use Authorization (EUA). This EUA will remain in effect (meaning this test can be used) for the duration of the COVID-19 declaration under Section 564(b)(1) of the Act, 21 U.S.C. section 360bbb-3(b)(1), unless the authorization is terminated or revoked.  Performed at [facility], [place] [address], [place], [place] [address]   Blood Culture (routine x 2)     Status: None (Preliminary result)   Collection Time: 07/16/22  1:47 AM   Specimen: BLOOD LEFT ARM  Result Value Ref Range Status   Specimen Description BLOOD LEFT ARM  Final   Special Requests   Final    BOTTLES DRAWN AEROBIC AND ANAEROBIC Blood Culture adequate volume   Culture   Final    NO GROWTH 3 DAYS Performed at [facility], [place] [address], [place], [place] [address]    Report Status PENDING  Incomplete  MRSA Next Gen by PCR, Nasal     Status: None   Collection Time: 07/16/22  6:43 AM   Specimen: Nasal Mucosa; Nasal Swab  Result Value Ref Range Status   MRSA by PCR Next Gen NOT DETECTED NOT DETECTED Final    Comment: (NOTE) The GeneXpert MRSA Assay (FDA approved for NASAL specimens only), is one component of Kirk Basquez comprehensive MRSA colonization surveillance program. It is not intended to diagnose MRSA infection nor to  guide or monitor treatment for MRSA infections. Test performance is not FDA approved in patients less than 71  years old. Performed at [facility], [place] [address], [place],  [address]      Labs: Basic Metabolic Panel: Recent Labs  Lab 07/16/22 0138 07/16/22 0217 07/17/22 0704 07/18/22 0355 07/19/22 0241  NA 136 139 136 138 137  K 4.0 3.6 4.1 4.1 4.1  CL 94*  --  97* 99 94*  CO2 28  --  31 33* 33*  GLUCOSE 145*  --  93 95 103*  BUN 27*  --  _0 CREATININE 2.19*  --  1.33* 1.21 1.17  CALCIUM 9.6  --  9.5 9.8 10.0  MG  --   --  1.6* 2.0 1.9  PHOS  --   --   --   --  2.9   Liver Function Tests: Recent Labs  Lab 07/16/22 0138 07/19/22 0241  AST 38 21  ALT 25 17  ALKPHOS 98 75  BILITOT 0.9 0.7  PROT 7.2 6.3*  ALBUMIN 3.5 2.6*   No results for input(s): "LIPASE", "AMYLASE" in the last 168 hours. No results for input(s): "AMMONIA" in the last 168 hours. CBC: Recent Labs  Lab 07/16/22 0138 07/16/22 0217 07/17/22 0704 07/18/22 0355 07/19/22 0241  WBC 10.6*  --  11.4* 7.1 6.4  NEUTROABS 8.5*  --   --   --  4.5  HGB 11.5* 11.9* 10.9* 10.4* 11.1*  HCT 35.6* 35.0* 34.4* 31.9* 33.7*  MCV 87.0  --  87.8 85.5 84.9  PLT 203  --  161 172 186   Cardiac Enzymes: No results for input(s): "CKTOTAL", "CKMB", "CKMBINDEX", "TROPONINI" in the last 168 hours. BNP: BNP (last 3 results) Recent Labs    05/21/22 0419 06/28/22 1012 07/16/22 0138  BNP 1,163.8* 124.0* 742.6*    ProBNP (last 3 results) No results for input(s): "PROBNP" in the last 8760 hours.  CBG: Recent Labs  Lab 07/17/22 2345 07/18/22 0355 07/18/22 0813 07/18/22 1143 07/18/22 1701  GLUCAP 121* 91 86 110* 111*       Signed:  Fayrene Helper MD.  [facility] 07/19/2022, 3:14 PM

## 2022-07-19 NOTE — Progress Notes (Addendum)
Advanced Heart Failure Rounding Note  PCP-Cardiologist: Sherren Mocha, MD   Subjective:    Feeling ok. Denies SOB.    Objective:   Weight Range: 85.8 kg Body mass index is 24.29 kg/m.   Vital Signs:   Temp:  [98 F (36.7 C)-98.7 F (37.1 C)] 98.1 F (36.7 C) (12/01 0455) Pulse Rate:  [75-98] 81 (12/01 0455) Resp:  [15-18] 16 (12/01 0455) BP: (94-133)/(56-91) 102/60 (12/01 0455) SpO2:  [89 %-100 %] 99 % (12/01 0455) Weight:  [85.8 kg] 85.8 kg (12/01 0455) Last BM Date : 07/15/22  Weight change: Filed Weights   07/18/22 0048 07/18/22 0449 07/19/22 0455  Weight: 86.4 kg 86.4 kg 85.8 kg    Intake/Output:   Intake/Output Summary (Last 24 hours) at 07/19/2022 0739 Last data filed at 07/18/2022 2256 Gross per 24 hour  Intake 240 ml  Output 1800 ml  Net -1560 ml      Physical Exam    General:   No resp difficulty HEENT: Normal Neck: Supple. JVP 5-6  . Carotids 2+ bilat; no bruits. No lymphadenopathy or thyromegaly appreciated. Cor: PMI nondisplaced. Regular rate & rhythm. No rubs, gallops or murmurs. Lungs: Coarse throughout on 2 ltiers.  Abdomen: Soft, nontender, nondistended. No hepatosplenomegaly. No bruits or masses. Good bowel sounds. Extremities: No cyanosis, clubbing, rash, edema Neuro: Alert & orientedx3, cranial nerves grossly intact. moves all 4 extremities w/o difficulty. Affect pleasant   Telemetry   SR 80s   EKG    N/A  Labs    CBC Recent Labs    07/18/22 0355 07/19/22 0241  WBC 7.1 6.4  NEUTROABS  --  4.5  HGB 10.4* 11.1*  HCT 31.9* 33.7*  MCV 85.5 84.9  PLT 172 992   Basic Metabolic Panel Recent Labs    07/18/22 0355 07/19/22 0241  NA 138 137  K 4.1 4.1  CL 99 94*  CO2 33* 33*  GLUCOSE 95 103*  BUN 15 15  CREATININE 1.21 1.17  CALCIUM 9.8 10.0  MG 2.0 1.9  PHOS  --  2.9   Liver Function Tests Recent Labs    07/19/22 0241  AST 21  ALT 17  ALKPHOS 75  BILITOT 0.7  PROT 6.3*  ALBUMIN 2.6*   No results  for input(s): "LIPASE", "AMYLASE" in the last 72 hours. Cardiac Enzymes No results for input(s): "CKTOTAL", "CKMB", "CKMBINDEX", "TROPONINI" in the last 72 hours.  BNP: BNP (last 3 results) Recent Labs    05/21/22 0419 06/28/22 1012 07/16/22 0138  BNP 1,163.8* 124.0* 742.6*    ProBNP (last 3 results) No results for input(s): "PROBNP" in the last 8760 hours.   D-Dimer No results for input(s): "DDIMER" in the last 72 hours. Hemoglobin A1C No results for input(s): "HGBA1C" in the last 72 hours. Fasting Lipid Panel No results for input(s): "CHOL", "HDL", "LDLCALC", "TRIG", "CHOLHDL", "LDLDIRECT" in the last 72 hours. Thyroid Function Tests No results for input(s): "TSH", "T4TOTAL", "T3FREE", "THYROIDAB" in the last 72 hours.  Invalid input(s): "FREET3"  Other results:   Imaging    No results found.   Medications:     Scheduled Medications:  apixaban  5 mg Oral BID   arformoterol  15 mcg Nebulization BID   atorvastatin  40 mg Oral Daily   bethanechol  5 mg Oral TID   Chlorhexidine Gluconate Cloth  6 each Topical Daily   dapagliflozin propanediol  10 mg Oral Daily   feeding supplement  237 mL Oral Q24H   finasteride  5 mg Oral QHS   gabapentin  300 mg Oral TID   midodrine  15 mg Oral TID   pantoprazole  40 mg Oral q morning   pneumococcal 20-valent conjugate vaccine  0.5 mL Intramuscular Tomorrow-1000   Riociguat  2 mg Oral 3 times per day    Infusions:  sodium chloride Stopped (07/16/22 1715)   ceFEPime (MAXIPIME) IV 2 g (07/18/22 2113)    PRN Medications: benzonatate, docusate sodium, ipratropium-albuterol, lip balm, menthol-cetylpyridinium, ondansetron (ZOFRAN) IV, mouth rinse, polyethylene glycol    Patient Profile    Mr. Joshua [name] is an 86y.o. AA male with history of HFPEF, prior PE, COPD, chronic hypercapnic respiratory failure on 2L at home, pulmonary hypertension, BPH, CKD, and OSA   Assessment/Plan   1. Acute on chronic respiratory failure -  Recently discharged 10/4 after being treated with steroids and antivirals for covid-19 - Sepsis 2/2 PNA vs UTI - Abx per PCCM, on cefepime  - Stable on 3L [place] 2. Chronic HFpEF/RV failure: Echo (2/22) with EF 55-60%, IV septum flattened, severe RV enlargement, severely decreased RV function, PASP 57 mmHg.  He has severe RV failure at baseline.  Echo this admit 8/23 >>RV severely reduced, severely enlarged, RVSP 50, LVEF 60-65%, no RWMA, mild TR, trivial MR.  RHC 8/23 showed normal/low filling pressures and mild PAH. - Volume stable stable. Would start torsemide 20 mg daily tomorrow.  - Continue midodrine 15 mg tid. Would avoid fludrocortisone. -Continue  dapagliflozin 10 mg daily.    2. Pulmonary HTN: PAH noted on 4/22 RHC with PVR 6.1 WU.  This appears to be multifactorial with OSA, severe emphysema, and a suspected chronic PE involving the right middle lobe (group 3 and group 4 PH). Given the suspected mixed etiology with only 1 area of chronic thromboembolism (right middle lobe) as well as age, do not think that pulmonary thromboendarterectomy would be indicated.  Rheumatologic serologic workup was negative.  PFTs showed severe obstruction and moderate restriction, suggesting significant COPD. [place] 8/23 showed mild pulmonary hypertension with PVR 3.4 WU.  - Continue riociguat.  - Holding off Tyvaso for now, suspect large group 3 PH component and only mildly elevated PA pressure on 8/23 RHC.  3. CKD III:  - Renal function stable.  4. CAD: nonobstructive on LHC 8/23. - HsTrop 146>230>220>223 on admission. Suspect demand ischemia - Continue statin.  5. OSA: Moderate OSA on sleep study. He refused CPAP. 6. Emphysema: Prior smoker.  Emphysema on CT and severe obstruction on PFTs. On 2L home oxygen. Currently on 3L post working with PT.  7. Chronic PE: Diagnosed by V/Q scan.  No abnormal bleeding. He has a probable chronic RLE DVT.   - Continue Eliquis.  8. Hiatial hernia w/ esophagitis: Has  significant GERD. He follows with Dr. Benson Norway. - Continue PPI.  9. [place] SMA: Compresses duodenum 10. Urinary retention - started on urecholine, continue finasteride    PT/OT recommending HH. He does not want home health. Discussed and encouraged to consider.   Length of Stay: 3  Amy Clegg, NP  07/19/2022, 7:39 AM  Advanced Heart Failure Team Pager [phone] (M-F; 7a - 5p)  Please contact [facility] for night-coverage after hours (5p -7a ) and weekends on [email or web address]  Agree with NP note.   Stable today but has not been out of bed.  Need to mobilize. SBP stable in 100s.   I think he can restart torsemide 20 mg daily tomorrow (home regimen).  Otherwise, no changes to cardiac meds.  Continue treatment of PNA per primary team.   Loralie Champagne 07/19/2022 8:51 AM

## 2022-07-19 NOTE — Progress Notes (Signed)
Pharmacy Antibiotic Note  Joshua [name] is a 86 y.o. male with PNA  Pharmacy has been consulted for cefepime dosing.   -SCr= 1.1, CrCl ~ 55 -cultures- ngtd -day 4 antibiotics  Plan: -No cefepime dose changes needed -Consider changing to po antibiotics  Height: _0  (188 cm) Weight: 85.8 kg (189 lb 2.5 oz) IBW/kg (Calculated) : 82.2  Temp (24hrs), Avg:98.3 F (36.8 C), Min:98.1 F (36.7 C), Max:98.7 F (37.1 C)  Recent Labs  Lab 07/16/22 0138 07/16/22 0320 07/17/22 0704 07/18/22 0355 07/19/22 0241  WBC 10.6*  --  11.4* 7.1 6.4  CREATININE 2.19*  --  1.33* 1.21 1.17  LATICACIDVEN 1.9 1.9  --   --   --      Estimated Creatinine Clearance: 54.6 mL/min (by C-G formula based on SCr of 1.17 mg/dL).    Allergies  Allergen Reactions   Other Swelling    Beer- Swelling    Sunflower Oil Swelling   Sulfa Antibiotics Rash    Thank you for allowing pharmacy to be a part of this patient's care.  Hildred Laser, PharmD Clinical Pharmacist **Pharmacist phone directory can now be found on [email or web address] ([email or web address]).  Listed under [place].

## 2022-07-19 NOTE — Care Management Important Message (Signed)
Important Message  Patient Details  Name: WYATT THORSTENSON MRN: [id number] Date of Birth: September 09, 1935   Medicare Important Message Given:  Yes     [name] 07/19/2022, 9:45 AM

## 2022-07-20 DIAGNOSIS — M1990 Unspecified osteoarthritis, unspecified site: Secondary | ICD-10-CM | POA: Diagnosis not present

## 2022-07-20 DIAGNOSIS — I2724 Chronic thromboembolic pulmonary hypertension: Secondary | ICD-10-CM | POA: Diagnosis not present

## 2022-07-20 DIAGNOSIS — J439 Emphysema, unspecified: Secondary | ICD-10-CM | POA: Diagnosis not present

## 2022-07-20 DIAGNOSIS — I252 Old myocardial infarction: Secondary | ICD-10-CM | POA: Diagnosis not present

## 2022-07-20 DIAGNOSIS — K224 Dyskinesia of esophagus: Secondary | ICD-10-CM | POA: Diagnosis not present

## 2022-07-20 DIAGNOSIS — Z7901 Long term (current) use of anticoagulants: Secondary | ICD-10-CM | POA: Diagnosis not present

## 2022-07-20 DIAGNOSIS — M1030 Gout due to renal impairment, unspecified site: Secondary | ICD-10-CM | POA: Diagnosis not present

## 2022-07-20 DIAGNOSIS — Z9981 Dependence on supplemental oxygen: Secondary | ICD-10-CM | POA: Diagnosis not present

## 2022-07-20 DIAGNOSIS — K469 Unspecified abdominal hernia without obstruction or gangrene: Secondary | ICD-10-CM | POA: Diagnosis not present

## 2022-07-20 DIAGNOSIS — J440 Chronic obstructive pulmonary disease with acute lower respiratory infection: Secondary | ICD-10-CM | POA: Diagnosis not present

## 2022-07-20 DIAGNOSIS — J181 Lobar pneumonia, unspecified organism: Secondary | ICD-10-CM | POA: Diagnosis not present

## 2022-07-20 DIAGNOSIS — I5032 Chronic diastolic (congestive) heart failure: Secondary | ICD-10-CM | POA: Diagnosis not present

## 2022-07-20 DIAGNOSIS — N183 Chronic kidney disease, stage 3 unspecified: Secondary | ICD-10-CM | POA: Diagnosis not present

## 2022-07-20 DIAGNOSIS — Z9181 History of falling: Secondary | ICD-10-CM | POA: Diagnosis not present

## 2022-07-20 DIAGNOSIS — R338 Other retention of urine: Secondary | ICD-10-CM | POA: Diagnosis not present

## 2022-07-20 DIAGNOSIS — N401 Enlarged prostate with lower urinary tract symptoms: Secondary | ICD-10-CM | POA: Diagnosis not present

## 2022-07-20 DIAGNOSIS — G4733 Obstructive sleep apnea (adult) (pediatric): Secondary | ICD-10-CM | POA: Diagnosis not present

## 2022-07-20 DIAGNOSIS — I2782 Chronic pulmonary embolism: Secondary | ICD-10-CM | POA: Diagnosis not present

## 2022-07-20 DIAGNOSIS — I130 Hypertensive heart and chronic kidney disease with heart failure and stage 1 through stage 4 chronic kidney disease, or unspecified chronic kidney disease: Secondary | ICD-10-CM | POA: Diagnosis not present

## 2022-07-20 DIAGNOSIS — I959 Hypotension, unspecified: Secondary | ICD-10-CM | POA: Diagnosis not present

## 2022-07-20 DIAGNOSIS — J9611 Chronic respiratory failure with hypoxia: Secondary | ICD-10-CM | POA: Diagnosis not present

## 2022-07-20 DIAGNOSIS — K2100 Gastro-esophageal reflux disease with esophagitis, without bleeding: Secondary | ICD-10-CM | POA: Diagnosis not present

## 2022-07-20 DIAGNOSIS — J9612 Chronic respiratory failure with hypercapnia: Secondary | ICD-10-CM | POA: Diagnosis not present

## 2022-07-20 DIAGNOSIS — I2510 Atherosclerotic heart disease of native coronary artery without angina pectoris: Secondary | ICD-10-CM | POA: Diagnosis not present

## 2022-07-21 LAB — CULTURE, BLOOD (ROUTINE X 2)
Culture: NO GROWTH
Culture: NO GROWTH
Special Requests: ADEQUATE
Special Requests: ADEQUATE

## 2022-07-22 ENCOUNTER — Other Ambulatory Visit (HOSPITAL_COMMUNITY): Payer: Self-pay | Admitting: *Deleted

## 2022-07-22 MED ORDER — MIDODRINE HCL 5 MG PO TABS: 15.0000 mg | ORAL_TABLET | Freq: Three times a day (TID) | ORAL | 3 refills | Status: DC

## 2022-07-25 ENCOUNTER — Other Ambulatory Visit (HOSPITAL_COMMUNITY): Payer: Self-pay | Admitting: Cardiology

## 2022-07-29 ENCOUNTER — Ambulatory Visit (INDEPENDENT_AMBULATORY_CARE_PROVIDER_SITE_OTHER): Payer: Medicare Other | Admitting: Podiatry

## 2022-07-29 ENCOUNTER — Encounter: Payer: Self-pay | Admitting: Podiatry

## 2022-07-29 VITALS — BP 89/54

## 2022-07-29 DIAGNOSIS — B351 Tinea unguium: Secondary | ICD-10-CM

## 2022-07-29 DIAGNOSIS — D689 Coagulation defect, unspecified: Secondary | ICD-10-CM

## 2022-07-29 DIAGNOSIS — Z860100 Personal history of colon polyps, unspecified: Secondary | ICD-10-CM | POA: Insufficient documentation

## 2022-07-29 DIAGNOSIS — F1010 Alcohol abuse, uncomplicated: Secondary | ICD-10-CM | POA: Insufficient documentation

## 2022-07-29 DIAGNOSIS — K5900 Constipation, unspecified: Secondary | ICD-10-CM | POA: Insufficient documentation

## 2022-07-29 DIAGNOSIS — R933 Abnormal findings on diagnostic imaging of other parts of digestive tract: Secondary | ICD-10-CM | POA: Insufficient documentation

## 2022-07-29 DIAGNOSIS — K2100 Gastro-esophageal reflux disease with esophagitis, without bleeding: Secondary | ICD-10-CM | POA: Insufficient documentation

## 2022-07-29 DIAGNOSIS — L84 Corns and callosities: Secondary | ICD-10-CM

## 2022-07-29 DIAGNOSIS — R634 Abnormal weight loss: Secondary | ICD-10-CM | POA: Insufficient documentation

## 2022-07-29 DIAGNOSIS — K2990 Gastroduodenitis, unspecified, without bleeding: Secondary | ICD-10-CM | POA: Insufficient documentation

## 2022-07-29 DIAGNOSIS — M79609 Pain in unspecified limb: Secondary | ICD-10-CM | POA: Diagnosis not present

## 2022-07-29 HISTORY — DX: Abnormal findings on diagnostic imaging of other parts of digestive tract: R93.3

## 2022-07-29 NOTE — Progress Notes (Signed)
  Subjective:  Patient ID: Joshua [name], male    DOB: August 31, 1935,  MRN: [id number]  Joshua [name] presents to clinic today for at risk foot care with h/o coagulation defect and painful thick toenails that are difficult to trim. Pain interferes with ambulation. Aggravating factors include wearing enclosed shoe gear. Pain is relieved with periodic professional debridement.   Patient has h/o COPD and is on supplemental O2. Chief Complaint  Patient presents with   Nail Problem    RFC PCP-Collins, Dana PCP VST- 2 or 3 weeks ago   New problem(s): None.   PCP is [name], DO.  Allergies  Allergen Reactions   Other Swelling    Beer- Swelling    Sunflower Oil Swelling   Sulfa Antibiotics Rash   Review of Systems: Negative except as noted in the HPI.  Objective: No changes noted in today's physical examination. Vitals:   07/29/22 1018  BP: (!) 89/54   Joshua [name] is a pleasant 86 y.o. male WD, WN in NAD. AAO x 3.  Vascular Examination: Capillary refill time <3 seconds b/l. Vascular status intact b/l with palpable pedal pulses. Pedal hair present b/l. No edema. No pain with calf compression b/l. Skin temperature gradient WNL b/l.   Neurological Examination: Sensation grossly intact b/l with 10 gram monofilament. Vibratory sensation intact b/l.   Dermatological Examination: Pedal skin with normal turgor, texture and tone b/l. Toenails 1-5 b/l thick, discolored, elongated with subungual debris and pain on dorsal palpation. Hyperkeratotic lesion(s) posteromedial aspect of right heel and submet head 5 b/l.  No erythema, no edema, no drainage, no fluctuance.  Musculoskeletal Examination: Muscle strength 5/5 to all lower extremity muscle groups bilaterally. No pain, crepitus or joint limitation noted with ROM bilateral LE. Hammertoe(s) noted to the bilateral 5th toes.  Radiographs: None  Last A1c:      Latest Ref Rng & Units 03/16/2022    5:46 PM  Hemoglobin A1C   Hemoglobin-A1c 4.8 - 5.6 % 5.0    Assessment/Plan: 1. Pain due to onychomycosis of nail   2. Callus   3. Coagulation disorder ([place])     No orders of the defined types were placed in this encounter.   -Patient's family member present. All questions/concerns addressed on today's visit. -Examined patient. -Patient to continue soft, supportive shoe gear daily. -Toenails 1-5 b/l were debrided in length and girth with sterile nail nippers and dremel without iatrogenic bleeding.  -Callus(es) posteromedial aspect of right heel and submet head 5 b/l pared utilizing sterile scalpel blade without complication or incident. Total number debrided =3. -Patient/POA to call should there be question/concern in the interim.   Return in about 3 months (around 10/28/2022).  Marzetta Board, DPM

## 2022-07-30 ENCOUNTER — Other Ambulatory Visit (HOSPITAL_COMMUNITY): Payer: Self-pay | Admitting: Cardiology

## 2022-07-30 DIAGNOSIS — J449 Chronic obstructive pulmonary disease, unspecified: Secondary | ICD-10-CM | POA: Diagnosis not present

## 2022-07-30 DIAGNOSIS — Z9981 Dependence on supplemental oxygen: Secondary | ICD-10-CM | POA: Diagnosis not present

## 2022-07-30 DIAGNOSIS — J189 Pneumonia, unspecified organism: Secondary | ICD-10-CM | POA: Diagnosis not present

## 2022-07-30 DIAGNOSIS — I5032 Chronic diastolic (congestive) heart failure: Secondary | ICD-10-CM | POA: Diagnosis not present

## 2022-07-30 DIAGNOSIS — Z09 Encounter for follow-up examination after completed treatment for conditions other than malignant neoplasm: Secondary | ICD-10-CM | POA: Diagnosis not present

## 2022-07-30 MED ORDER — MIDODRINE HCL 5 MG PO TABS: 15.0000 mg | ORAL_TABLET | Freq: Three times a day (TID) | ORAL | 3 refills | Status: DC

## 2022-07-30 NOTE — Telephone Encounter (Signed)
Midodrine Quantity update to reflect 90 day supply as requested by patient. Current script is only for 15 days

## 2022-07-31 DIAGNOSIS — I959 Hypotension, unspecified: Secondary | ICD-10-CM | POA: Diagnosis not present

## 2022-07-31 DIAGNOSIS — J181 Lobar pneumonia, unspecified organism: Secondary | ICD-10-CM | POA: Diagnosis not present

## 2022-07-31 DIAGNOSIS — J439 Emphysema, unspecified: Secondary | ICD-10-CM | POA: Diagnosis not present

## 2022-07-31 DIAGNOSIS — J9612 Chronic respiratory failure with hypercapnia: Secondary | ICD-10-CM | POA: Diagnosis not present

## 2022-07-31 DIAGNOSIS — J9611 Chronic respiratory failure with hypoxia: Secondary | ICD-10-CM | POA: Diagnosis not present

## 2022-07-31 DIAGNOSIS — J440 Chronic obstructive pulmonary disease with acute lower respiratory infection: Secondary | ICD-10-CM | POA: Diagnosis not present

## 2022-08-02 DIAGNOSIS — I959 Hypotension, unspecified: Secondary | ICD-10-CM | POA: Diagnosis not present

## 2022-08-02 DIAGNOSIS — J181 Lobar pneumonia, unspecified organism: Secondary | ICD-10-CM | POA: Diagnosis not present

## 2022-08-02 DIAGNOSIS — J440 Chronic obstructive pulmonary disease with acute lower respiratory infection: Secondary | ICD-10-CM | POA: Diagnosis not present

## 2022-08-02 DIAGNOSIS — J9612 Chronic respiratory failure with hypercapnia: Secondary | ICD-10-CM | POA: Diagnosis not present

## 2022-08-02 DIAGNOSIS — J439 Emphysema, unspecified: Secondary | ICD-10-CM | POA: Diagnosis not present

## 2022-08-02 DIAGNOSIS — J9611 Chronic respiratory failure with hypoxia: Secondary | ICD-10-CM | POA: Diagnosis not present

## 2022-08-05 DIAGNOSIS — J181 Lobar pneumonia, unspecified organism: Secondary | ICD-10-CM | POA: Diagnosis not present

## 2022-08-05 DIAGNOSIS — J9612 Chronic respiratory failure with hypercapnia: Secondary | ICD-10-CM | POA: Diagnosis not present

## 2022-08-05 DIAGNOSIS — J439 Emphysema, unspecified: Secondary | ICD-10-CM | POA: Diagnosis not present

## 2022-08-05 DIAGNOSIS — J9611 Chronic respiratory failure with hypoxia: Secondary | ICD-10-CM | POA: Diagnosis not present

## 2022-08-05 DIAGNOSIS — J440 Chronic obstructive pulmonary disease with acute lower respiratory infection: Secondary | ICD-10-CM | POA: Diagnosis not present

## 2022-08-05 DIAGNOSIS — I959 Hypotension, unspecified: Secondary | ICD-10-CM | POA: Diagnosis not present

## 2022-08-06 DIAGNOSIS — J440 Chronic obstructive pulmonary disease with acute lower respiratory infection: Secondary | ICD-10-CM | POA: Diagnosis not present

## 2022-08-06 DIAGNOSIS — J181 Lobar pneumonia, unspecified organism: Secondary | ICD-10-CM | POA: Diagnosis not present

## 2022-08-06 DIAGNOSIS — J439 Emphysema, unspecified: Secondary | ICD-10-CM | POA: Diagnosis not present

## 2022-08-06 DIAGNOSIS — J9611 Chronic respiratory failure with hypoxia: Secondary | ICD-10-CM | POA: Diagnosis not present

## 2022-08-07 NOTE — Progress Notes (Signed)
PCP: [name], DO Cardiology: Dr. Burt Knack HF Cardiology: Dr. Aundra Dubin  86 y.o. with history of HFPEF, prior PE, and pulmonary hypertension was referred by Dr. Gasper Sells for evaluation of pulmonary hypertension.  Patient had a PE in 2016.  He is on apixaban. He has a long history of diastolic CHF.  Most recent echo showed a significant component of RV failure with EF 55-60%, IV septum flattened, severe RV enlargement, severely decreased RV function, PASP 57 mmHg.  CTA chest in 4/22 showed no acute PE and mild emphysema, but V/Q scan in 5/22 was suggestive of chronic PE in the right middle lobe.  RHC in 4/22 showed normal filling pressures with moderate PAH. He additionally has chronic hiccups followed by Dr. Benson Norway, now on gabapentin. PFTs in 6/22 showed mixed picture with severe obstruction, moderate restriction, and severely decreased DLCO.   Admitted 8/25-8/30/22 with A/C HF exacerbation. He was aggressively diuresed with lasix/metolazone, had unna boots, farxiga added.  Midodrine was increased for hypotension. Hospitalization c/b AKI on CKD III.  Readmitted 9/1-9/12/22 with shock (mixed septic and cardiogenic), likely 2/2 to aspiration pneumonia and A/C CHF.  Suspect possible gut translocation with ileus/partial SBO; CT abdomen with "nutcracker phenomenon".  He was started on NE + antibiotics. General surgery consulted and felt not to be a surgical candidate. Diuretics initially held due to over-diuresis and shock. Eventually able to wean pressors off, restart PO torsemide and midodrine. Hospitalization c/b transaminitis, delirium, and BPH with urinary retention, requiring foley catheter. Palliative care was consulted for [place] and patient DNR/DNI. PT/OT recommended HH. He was discharged home, weight 220 lbs.  EGD 11/22 showed esophagitis, hiatal hernia and yeast.   Acute visit 07/17/21 for weakness/falls. He had a syncopal episode in clinic. Labs showed hypokalemia of 3.3, SCr elevated at 2.13,  ReDs 27%. [place] stopped, torsemide decreased to 40 mg daily and compression hose and abdominal binder ordered. 3-day Zio placed to evaluate for possible arrhythmogenic cause of symptoms.  Zio monitor showed 19 second run of VT.   Follow up 1/23, rare atypical chest pain. NYHA III, weight continued to trend down.  Admitted 2/23 with PNA and a/c CHF. Seen by palliative care and remained full code.  Admitted 8/23 with septic shock due to aspiration PNA & possible colitis. Echo showed EF 60-65%, RV severely reduced, severely enlarged, RVSP 50, LVEF 60-65%, no RWMA, mild TR, trivial MR. AHF consulted for elevated Hs trop. He underwent R/LHC showing nonobstructive mild CAD, normal/low filling pressures and mild PAH. Troponin elevation likely due to demand ischemia. Hospitalization c/b AKI on CKD. He was restarted on Farxiga and lower dose torsemide (10 mg daily). He was discharged home, weight 175 lbs.   He was admitted 11/23 with a/c respiratory failure, recently discharged (10/23)  after COVID-19 infection. Felt to be septic 2/2 PNA vs UTI. Received IV abx. HsTrop elevated on admission but felt to be demand ischemia. Stable from a HF standpoint and continued on torsemide, midodrine and Farxiga. He was discharged home with HH, weight 188 lbs.  Today he returns for post hospital HF follow up. Overall feeling fine. Remains on 2L oxygen. Sleeping a lot and energy is poor. He is SOB walking up steps but does OK with ADLs or walking around the house. No falls. Denies palpitations, abnormal bleeding, CP, dizziness, edema, or PND/Orthopnea. Appetite ok. No fever or chills. Weight at home 175-176 pounds. Taking all medications. Has HH PT twice a week. Has productive cough, blood tinged first thing in the AM.  ECG (personally reviewed): NSR, RBBB QRS 158 msec, 72 bpm  Labs (4/22): K 4.8, creatinine 1.67 Labs (6/22): K 3.8, creatinine 1.72, ANA negative, RF 39.6 but CCP negative, SCL-70 negative, BNP 1231, HIV  negative Labs (7/22): K 3.8, creatine 1.67 Labs (8/22): K 3.6, creatinine 1.6, hgb 15.3  Labs (9/22): K 5.0, creatinine 1.2, hgb 14.6 Labs (10/22): K 3.4, creatinine 1.37 Labs (10/22): K 3.5, creatinine 1.34 Labs (12/22): K 4.5, creatinine 1.81 Labs (2/23): K 3.8, creatinine 1.20 Labs (5/23): K 4.3, creatinine 1.5, BNP 84 Labs (8/23): K 4.5, creatinine 1.00 Labs (10/23): K 3.9, creatinine 1.49, AST 43, ALT normal, BNP 1164 Labs (12/23): K 4.1, creatinine 1.17  6 minute walk (7/22): 213 m 6 minute walk (10/22): 274 m (oxygen saturation dropped to 70s on 2L [place])  PMH: 1. HFPEF: With prominent RV failure.   - Echo (2/22): EF 55-60%, IV septum flattened, severe RV enlargement, severely decreased RV function, PASP 57 mmHg.  - Echo (8/23): EF 60-65%, RV severely reduced, severely enlarged, RVSP 50, LVEF 60-65%, no RWMA, mild TR, trivial MR.  2. Venous thromboembolic disease: PE in 9784.   - Venous dopplers (4/22): No DVT.  - CTA chest (4/22): No PE.  - V/Q scan 5/22 with perfusion defect in the RML consistent with chronic PE.  3. OSA: Waiting on CPAP.  4. HTN 5. COPD: Prior smoker.   - CTA chest in 4/22 showed no PE, mild emphysema.  - PFTs (6/22) with severe obstruction, moderate restriction, severely decreased DLCO 6. Pulmonary hypertension: RHC (4/22) with mean RA 5, PA 65/19 mean 36, mean PCWP 5, CI 2.19, PVR 6.1 WU, PAPi 9.2.  - RHC (8/23) with mean RA 1, PA 46/10 mean 25, mean PCWP 4, CI 2.98, PVR 3.4 WU 7. Chronic hiccups 8. BPH: Has to in and out cath at times. 9. Syncope: Zio 12/22 with 19 second VT run.  10. Chronic hiccups 11. 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 12. CAD: LHC (8/23) with nonobstructive CAD.   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 Stopped working in  2022  Tobacco Use   Smoking status: Former    Packs/day: 1.00    Years: 50.00    Total pack years: 50.00    Types: Cigarettes    Start date: 29    Quit date: 08/19/2006    Years since quitting: 15.9   Smokeless tobacco: Never   Tobacco comments:    Former smoke 03/15/22  Vaping Use   Vaping Use: Never used  Substance and Sexual Activity   Alcohol use: Not Currently    Alcohol/week: 6.0 standard drinks of alcohol    Types: 6 Shots of liquor per week   Drug use: No   Sexual activity: Not on file  Other Topics Concern   Not on file  Social History Narrative   Not on file   Social Determinants of Health   Financial Resource Strain: Not on file  Food Insecurity: Unknown (07/18/2022)   Hunger Vital Sign    Worried About Running Out of Food in the Last Year: Patient refused    [place] in the Last Year: Patient refused  Transportation Needs: No Transportation Needs (07/18/2022)   PRAPARE - Hydrologist (Medical): No    Lack of Transportation (Non-Medical): No  Physical Activity: Not on file  Stress: No Stress Concern Present (05/09/2021)   [place]    Feeling of Stress : Only a little  Social Connections: Not on file  Intimate Partner Violence: Not At Risk (07/18/2022)   Humiliation, Afraid, Rape, and Kick questionnaire    Fear of Current or Ex-Partner: No    Emotionally Abused: No    Physically Abused: No    Sexually Abused: No   Family History  Problem Relation Age of Onset   Heart attack Brother 60   ROS: All systems reviewed and negative except as per HPI.   Current Outpatient Medications  Medication Sig Dispense Refill   albuterol (VENTOLIN HFA) 108 (90 Base) MCG/ACT inhaler Inhale 1 puff into the lungs every 4 (four) hours as needed  for shortness of breath.     allopurinol (ZYLOPRIM) 100 MG tablet Take 100 mg by mouth every morning.     atorvastatin (LIPITOR) 40 MG tablet Take 1 tablet (40 mg total) by mouth daily. 30 tablet 6   Cinnamon 500 MG capsule Take 500 mg by mouth every morning.     dapagliflozin propanediol (FARXIGA) 10 MG TABS tablet Take 1 tablet (10 mg total) by mouth daily before breakfast. 90 tablet 3   ELIQUIS 5 MG TABS tablet TAKE 1 TABLET BY MOUTH TWICE A DAY (Patient taking differently: Take 5 mg by mouth 2 (two) times daily.) 60 tablet 11   finasteride (PROSCAR) 5 MG tablet Take 5 mg by mouth at bedtime.     gabapentin (NEURONTIN) 300 MG capsule Take 300 mg by mouth 3 (three) times daily.     LINZESS 72 MCG capsule Take 72 mcg by mouth every morning.     magnesium oxide (MAG-OX) 400 (240 Mg) MG tablet TAKE 1 TABLET BY MOUTH EVERY DAY (Patient taking differently: Take 400 mg by mouth daily.) 90 tablet 1   metoCLOPramide (REGLAN) 5 MG tablet Take 5 mg by mouth 3 (three) times daily before meals.     midodrine (PROAMATINE) 5 MG tablet Take 3 tablets (15 mg total) by mouth 3 (three) times daily. 810 tablet 3   Multiple Vitamin (MULTIVITAMIN WITH MINERALS) TABS tablet Take 1 tablet by mouth daily. 30 tablet 0   OXYGEN Inhale 2 L into the lungs continuous.     pantoprazole (PROTONIX) 40 MG tablet Take 40 mg by mouth every morning.     Riociguat 2 MG TABS Take 2 mg by mouth in the morning, at noon, and at bedtime. 90 tablet 11   STIOLTO RESPIMAT 2.5-2.5 MCG/ACT AERS INHALE 2 PUFFS BY MOUTH INTO THE LUNGS DAILY 4 g 5   torsemide (DEMADEX) 20 MG tablet Take 1 tablet (20 mg total) by mouth daily. 30 tablet 6   benzonatate (TESSALON) 100 MG capsule Take 1 capsule (100 mg total) by mouth 3 (three) times daily as needed for cough. (Patient not taking: Reported on 08/09/2022) 20 capsule 0   No current facility-administered medications for this encounter.   Wt Readings from Last 3 Encounters:  08/09/22 81.6 kg (180  lb)  07/19/22 85.8 kg (189 lb 2.5 oz)  06/28/22 82.5 kg (181 lb 12.8 oz)   BP 120/80   Pulse 77   Wt 81.6 kg (180 lb)   SpO2 94%   BMI  23.11 kg/m  Physical Exam General:  NAD. No resp difficulty, walked into clinic on oxygen, elderly HEENT: Normal Neck: Supple. No JVD. Carotids 2+ bilat; no bruits. No lymphadenopathy or thryomegaly appreciated. Cor: PMI nondisplaced. Regular rate & rhythm. No rubs, gallops or murmurs. Lungs: Clear Abdomen: Soft, nontender, nondistended. No hepatosplenomegaly. No bruits or masses. Good bowel sounds. Extremities: No cyanosis, clubbing, rash, edema Neuro: Alert & oriented x 3, cranial nerves grossly intact. Moves all 4 extremities w/o difficulty. Affect pleasant.  Assessment/Plan: 1. Chronic HFpEF/RV failure: Echo (2/22) with EF 55-60%, IV septum flattened, severe RV enlargement, severely decreased RV function, PASP 57 mmHg.  He has severe RV failure at baseline.  Echo this admit 8/23 >>RV severely reduced, severely enlarged, RVSP 50, LVEF 60-65%, no RWMA, mild TR, trivial MR.  RHC 8/23 showed normal/low filling pressures and mild PAH. NYHA II-III, function capacity confounded by respiratory issues and physical deconditioning. He is not volume overloaded on exam. - Continue torsemide 20 mg daily. - Continue midodrine 15 mg tid. Would avoid fludrocortisone. - Continue dapagliflozin 10 mg daily.  No GU symptoms. 2. Pulmonary HTN: PAH noted on 4/22 RHC with PVR 6.1 WU.  This appears to be multifactorial with OSA, severe emphysema, and a suspected chronic PE involving the right middle lobe (group 3 and group 4 PH). Given the suspected mixed etiology with only 1 area of chronic thromboembolism (right middle lobe) as well as age, do not think that pulmonary thromboendarterectomy would be indicated.  Rheumatologic serologic workup was negative.  PFTs showed severe obstruction and moderate restriction, suggesting significant COPD. [place] 8/23 showed mild pulmonary  hypertension with PVR 3.4 WU.  - Continue riociguat.  - Holding off Tyvaso for now, suspect large group 3 PH component and only mildly elevated PA pressure on 8/23 RHC.  3. Chronic respiratory failure: Admission 10/23 for COVID. Recent admission for PNA.  - Completed abx - Stable on 2L [place] 4. CKD III: BMET today. 5. CAD: nonobstructive on [facility] 8/23. No chest pain - Continue statin.  6. OSA: Moderate OSA on sleep study. He refused CPAP. 7. Emphysema: Prior smoker.  Emphysema on CT and severe obstruction on PFTs. On 2L home oxygen. Currently on 2L. 8. Chronic PE: Diagnosed by V/Q scan.  No abnormal bleeding. He has a probable chronic RLE DVT.   - Continue Eliquis. No bleeding issues. 9. Hiatial hernia w/ esophagitis: Has significant GERD. He follows with Dr. Benson Norway. - Continue PPI.  9. Nutcracker SMA: Compresses duodenum 10. Urinary retention: Continue finasteride 11. Physical Deconditioning: Continue HH PT.   Follow up in 8 weeks with APP and 4-5 months with Dr. Aundra Dubin.  Allena Katz, FNP-BC 08/09/22

## 2022-08-08 DIAGNOSIS — I959 Hypotension, unspecified: Secondary | ICD-10-CM | POA: Diagnosis not present

## 2022-08-08 DIAGNOSIS — J181 Lobar pneumonia, unspecified organism: Secondary | ICD-10-CM | POA: Diagnosis not present

## 2022-08-08 DIAGNOSIS — J439 Emphysema, unspecified: Secondary | ICD-10-CM | POA: Diagnosis not present

## 2022-08-08 DIAGNOSIS — J9612 Chronic respiratory failure with hypercapnia: Secondary | ICD-10-CM | POA: Diagnosis not present

## 2022-08-08 DIAGNOSIS — J440 Chronic obstructive pulmonary disease with acute lower respiratory infection: Secondary | ICD-10-CM | POA: Diagnosis not present

## 2022-08-08 DIAGNOSIS — J9611 Chronic respiratory failure with hypoxia: Secondary | ICD-10-CM | POA: Diagnosis not present

## 2022-08-09 ENCOUNTER — Telehealth (HOSPITAL_COMMUNITY): Payer: Self-pay | Admitting: Cardiology

## 2022-08-09 ENCOUNTER — Encounter (HOSPITAL_COMMUNITY): Payer: Self-pay

## 2022-08-09 ENCOUNTER — Ambulatory Visit (HOSPITAL_COMMUNITY)
Admit: 2022-08-09 | Discharge: 2022-08-09 | Disposition: A | Discharge status: Home / Self Care | Payer: Medicare Other | Source: Ambulatory Visit | Attending: Family Medicine | Admitting: Family Medicine

## 2022-08-09 VITALS — BP 120/80 | HR 77 | Wt 180.0 lb

## 2022-08-09 DIAGNOSIS — J432 Centrilobular emphysema: Secondary | ICD-10-CM

## 2022-08-09 DIAGNOSIS — I2782 Chronic pulmonary embolism: Secondary | ICD-10-CM | POA: Insufficient documentation

## 2022-08-09 DIAGNOSIS — Z86711 Personal history of pulmonary embolism: Secondary | ICD-10-CM | POA: Diagnosis not present

## 2022-08-09 DIAGNOSIS — I272 Pulmonary hypertension, unspecified: Secondary | ICD-10-CM | POA: Insufficient documentation

## 2022-08-09 DIAGNOSIS — J439 Emphysema, unspecified: Secondary | ICD-10-CM | POA: Diagnosis not present

## 2022-08-09 DIAGNOSIS — R5381 Other malaise: Secondary | ICD-10-CM | POA: Diagnosis not present

## 2022-08-09 DIAGNOSIS — K449 Diaphragmatic hernia without obstruction or gangrene: Secondary | ICD-10-CM | POA: Insufficient documentation

## 2022-08-09 DIAGNOSIS — J9611 Chronic respiratory failure with hypoxia: Secondary | ICD-10-CM

## 2022-08-09 DIAGNOSIS — I5032 Chronic diastolic (congestive) heart failure: Secondary | ICD-10-CM | POA: Diagnosis not present

## 2022-08-09 DIAGNOSIS — J9610 Chronic respiratory failure, unspecified whether with hypoxia or hypercapnia: Secondary | ICD-10-CM | POA: Insufficient documentation

## 2022-08-09 DIAGNOSIS — K56600 Partial intestinal obstruction, unspecified as to cause: Secondary | ICD-10-CM

## 2022-08-09 DIAGNOSIS — Z8616 Personal history of COVID-19: Secondary | ICD-10-CM | POA: Diagnosis not present

## 2022-08-09 DIAGNOSIS — I130 Hypertensive heart and chronic kidney disease with heart failure and stage 1 through stage 4 chronic kidney disease, or unspecified chronic kidney disease: Secondary | ICD-10-CM | POA: Insufficient documentation

## 2022-08-09 DIAGNOSIS — R338 Other retention of urine: Secondary | ICD-10-CM | POA: Insufficient documentation

## 2022-08-09 DIAGNOSIS — Z79899 Other long term (current) drug therapy: Secondary | ICD-10-CM | POA: Insufficient documentation

## 2022-08-09 DIAGNOSIS — I2510 Atherosclerotic heart disease of native coronary artery without angina pectoris: Secondary | ICD-10-CM | POA: Diagnosis not present

## 2022-08-09 DIAGNOSIS — I50810 Right heart failure, unspecified: Secondary | ICD-10-CM | POA: Diagnosis not present

## 2022-08-09 DIAGNOSIS — K2100 Gastro-esophageal reflux disease with esophagitis, without bleeding: Secondary | ICD-10-CM | POA: Insufficient documentation

## 2022-08-09 DIAGNOSIS — N183 Chronic kidney disease, stage 3 unspecified: Secondary | ICD-10-CM | POA: Insufficient documentation

## 2022-08-09 DIAGNOSIS — R059 Cough, unspecified: Secondary | ICD-10-CM | POA: Diagnosis not present

## 2022-08-09 DIAGNOSIS — Z7901 Long term (current) use of anticoagulants: Secondary | ICD-10-CM | POA: Diagnosis not present

## 2022-08-09 DIAGNOSIS — G4733 Obstructive sleep apnea (adult) (pediatric): Secondary | ICD-10-CM | POA: Diagnosis not present

## 2022-08-09 DIAGNOSIS — Z9981 Dependence on supplemental oxygen: Secondary | ICD-10-CM | POA: Insufficient documentation

## 2022-08-09 LAB — BASIC METABOLIC PANEL
Anion gap: 7 (ref 5–15)
BUN: 29 mg/dL — ABNORMAL HIGH (ref 8–23)
CO2: 32 mmol/L (ref 22–32)
Calcium: 9.9 mg/dL (ref 8.9–10.3)
Chloride: 98 mmol/L (ref 98–111)
Creatinine, Ser: 1.71 mg/dL — ABNORMAL HIGH (ref 0.61–1.24)
GFR, Estimated: 39 mL/min — ABNORMAL LOW (ref >60)
Glucose, Bld: 96 mg/dL (ref 70–99)
Potassium: 3.9 mmol/L (ref 3.5–5.1)
Sodium: 137 mmol/L (ref 135–145)

## 2022-08-09 LAB — CBC
HCT: 37 % — ABNORMAL LOW (ref 39.0–52.0)
Hemoglobin: 11.3 g/dL — ABNORMAL LOW (ref 13.0–17.0)
MCH: 26.8 pg (ref 26.0–34.0)
MCHC: 30.5 g/dL (ref 30.0–36.0)
MCV: 87.7 fL (ref 80.0–100.0)
Platelets: 191 10*3/uL (ref 150–400)
RBC: 4.22 MIL/uL (ref 4.22–5.81)
RDW: 16 % — ABNORMAL HIGH (ref 11.5–15.5)
WBC: 4.5 10*3/uL (ref 4.0–10.5)
nRBC: 0 % (ref 0.0–0.2)

## 2022-08-09 MED ORDER — TORSEMIDE 20 MG PO TABS: 20.0000 mg | ORAL_TABLET | ORAL | 6 refills | Status: DC

## 2022-08-09 NOTE — Patient Instructions (Signed)
There has been no changes to your medications.  Labs done today, your results will be available in MyChart, we will contact you for abnormal readings.  Your physician recommends that you schedule a follow-up appointment in: 8 weeks with Janett Billow and 4 Months with Dr. Aundra Dubin ( April 2024)  ** please call the office in February to arrange your appointment with him **  If you have any questions or concerns before your next appointment please send [place] a message through [place] or call our office at [phone].    TO LEAVE A MESSAGE FOR THE NURSE SELECT OPTION 2, PLEASE LEAVE A MESSAGE INCLUDING: YOUR NAME DATE OF BIRTH CALL BACK NUMBER REASON FOR CALL**this is important as we prioritize the call backs  YOU WILL RECEIVE A CALL BACK THE SAME DAY AS LONG AS YOU CALL BEFORE 4:00 PM  At the [facility], you and your health needs are our priority. As part of our continuing mission to provide you with exceptional heart care, we have created designated Provider Care Teams. These Care Teams include your primary Cardiologist (physician) and Advanced Practice Providers (APPs- Physician Assistants and Nurse Practitioners) who all work together to provide you with the care you need, when you need it.   You may see any of the following providers on your designated Care Team at your next follow up: Dr Glori Bickers Dr Loralie Champagne Dr. Roxana Hires, NP Lyda Jester, [place] [facility] [place], [place] Forestine Na, NP Audry Riles, PharmD   Please be sure to bring in all your medications bottles to every appointment.

## 2022-08-09 NOTE — Telephone Encounter (Signed)
Patient called.  Patient aware.  

## 2022-08-09 NOTE — Telephone Encounter (Signed)
-----  Message from Rafael Bihari, [place] sent at 08/09/2022  2:17 PM EST ----- Kidney function elevated, decrease torsemide to 20 mg every other day.  Repeat BMET in 7-10 days

## 2022-08-14 DIAGNOSIS — K219 Gastro-esophageal reflux disease without esophagitis: Secondary | ICD-10-CM | POA: Diagnosis not present

## 2022-08-14 DIAGNOSIS — R634 Abnormal weight loss: Secondary | ICD-10-CM | POA: Diagnosis not present

## 2022-08-14 DIAGNOSIS — R339 Retention of urine, unspecified: Secondary | ICD-10-CM | POA: Diagnosis not present

## 2022-08-14 DIAGNOSIS — N401 Enlarged prostate with lower urinary tract symptoms: Secondary | ICD-10-CM | POA: Diagnosis not present

## 2022-08-15 DIAGNOSIS — J440 Chronic obstructive pulmonary disease with acute lower respiratory infection: Secondary | ICD-10-CM | POA: Diagnosis not present

## 2022-08-15 DIAGNOSIS — I959 Hypotension, unspecified: Secondary | ICD-10-CM | POA: Diagnosis not present

## 2022-08-15 DIAGNOSIS — J9612 Chronic respiratory failure with hypercapnia: Secondary | ICD-10-CM | POA: Diagnosis not present

## 2022-08-15 DIAGNOSIS — J439 Emphysema, unspecified: Secondary | ICD-10-CM | POA: Diagnosis not present

## 2022-08-15 DIAGNOSIS — J9611 Chronic respiratory failure with hypoxia: Secondary | ICD-10-CM | POA: Diagnosis not present

## 2022-08-15 DIAGNOSIS — J181 Lobar pneumonia, unspecified organism: Secondary | ICD-10-CM | POA: Diagnosis not present

## 2022-08-20 ENCOUNTER — Telehealth (HOSPITAL_COMMUNITY): Payer: Self-pay | Admitting: *Deleted

## 2022-08-20 ENCOUNTER — Ambulatory Visit (HOSPITAL_COMMUNITY)
Admission: RE | Admit: 2022-08-20 | Discharge: 2022-08-20 | Disposition: A | Discharge status: Home / Self Care | Payer: Medicare Other | Source: Ambulatory Visit | Attending: Cardiology | Admitting: Cardiology

## 2022-08-20 DIAGNOSIS — I5032 Chronic diastolic (congestive) heart failure: Secondary | ICD-10-CM | POA: Insufficient documentation

## 2022-08-20 DIAGNOSIS — Z79899 Other long term (current) drug therapy: Secondary | ICD-10-CM

## 2022-08-20 LAB — BASIC METABOLIC PANEL
Anion gap: 11 (ref 5–15)
BUN: 39 mg/dL — ABNORMAL HIGH (ref 8–23)
CO2: 34 mmol/L — ABNORMAL HIGH (ref 22–32)
Calcium: 10.1 mg/dL (ref 8.9–10.3)
Chloride: 93 mmol/L — ABNORMAL LOW (ref 98–111)
Creatinine, Ser: 1.97 mg/dL — ABNORMAL HIGH (ref 0.61–1.24)
GFR, Estimated: 33 mL/min — ABNORMAL LOW (ref >60)
Glucose, Bld: 115 mg/dL — ABNORMAL HIGH (ref 70–99)
Potassium: 3.8 mmol/L (ref 3.5–5.1)
Sodium: 138 mmol/L (ref 135–145)

## 2022-08-20 MED ORDER — TORSEMIDE 20 MG PO TABS: ORAL_TABLET | ORAL | 6 refills | Status: DC

## 2022-08-20 NOTE — Telephone Encounter (Signed)
Called patient per Allena Katz, NP with following instructions based on labs drawn today:  "Stop daily Torsemide, may take PRN weight gain/swelling or shortness of breath. Repeat labs in 7-10 days please"  Pt verbalized understanding of same. Will return for repeat BMP next week.

## 2022-08-28 ENCOUNTER — Telehealth (HOSPITAL_COMMUNITY): Payer: Self-pay

## 2022-08-28 ENCOUNTER — Other Ambulatory Visit (HOSPITAL_COMMUNITY): Payer: Self-pay

## 2022-08-28 ENCOUNTER — Ambulatory Visit (HOSPITAL_COMMUNITY)
Admission: RE | Admit: 2022-08-28 | Discharge: 2022-08-28 | Disposition: A | Discharge status: Home / Self Care | Payer: Medicare Other | Source: Ambulatory Visit | Attending: Cardiology | Admitting: Cardiology

## 2022-08-28 ENCOUNTER — Encounter (HOSPITAL_COMMUNITY): Payer: Medicare Other

## 2022-08-28 DIAGNOSIS — I5032 Chronic diastolic (congestive) heart failure: Secondary | ICD-10-CM

## 2022-08-28 DIAGNOSIS — Z79899 Other long term (current) drug therapy: Secondary | ICD-10-CM | POA: Diagnosis not present

## 2022-08-28 LAB — BASIC METABOLIC PANEL
Anion gap: 7 (ref 5–15)
BUN: 23 mg/dL (ref 8–23)
CO2: 32 mmol/L (ref 22–32)
Calcium: 9.9 mg/dL (ref 8.9–10.3)
Chloride: 100 mmol/L (ref 98–111)
Creatinine, Ser: 1.61 mg/dL — ABNORMAL HIGH (ref 0.61–1.24)
GFR, Estimated: 42 mL/min — ABNORMAL LOW (ref >60)
Glucose, Bld: 79 mg/dL (ref 70–99)
Potassium: 4.6 mmol/L (ref 3.5–5.1)
Sodium: 139 mmol/L (ref 135–145)

## 2022-08-28 LAB — BRAIN NATRIURETIC PEPTIDE: B Natriuretic Peptide: 347.2 pg/mL — ABNORMAL HIGH (ref 0.0–100.0)

## 2022-08-28 NOTE — Telephone Encounter (Signed)
Mr. Joshua [name] called and stated he is having some swelling. He is coming in for repeat labs today. He has not taken any Torsemide - thought he was not supposed to. I explained he can take as needed for weight gain and swelling per your last note - he said he will take one today. I let him know I would inform you and that we will see what the labs show [place] as well.  He denies any significant shortness of breath or chest pain. He said he has some weight gain, but could not tell me how much.

## 2022-08-30 ENCOUNTER — Other Ambulatory Visit (HOSPITAL_COMMUNITY): Payer: Self-pay

## 2022-08-30 MED ORDER — RIOCIGUAT 2 MG PO TABS: 2.0000 mg | ORAL_TABLET | Freq: Three times a day (TID) | ORAL | 11 refills | Status: DC

## 2022-09-03 DIAGNOSIS — Z23 Encounter for immunization: Secondary | ICD-10-CM | POA: Diagnosis not present

## 2022-09-20 NOTE — Progress Notes (Signed)
PCP: [name], DO Cardiology: Dr. Burt Knack HF Cardiology: Dr. Aundra Dubin  87 y.o. with history of HFPEF, prior PE, and pulmonary hypertension was referred by Dr. Gasper Sells for evaluation of pulmonary hypertension.  Patient had a PE in 2016.  He is on apixaban. He has a long history of diastolic CHF.  Most recent echo showed a significant component of RV failure with EF 55-60%, IV septum flattened, severe RV enlargement, severely decreased RV function, PASP 57 mmHg.  CTA chest in 4/22 showed no acute PE and mild emphysema, but V/Q scan in 5/22 was suggestive of chronic PE in the right middle lobe.  RHC in 4/22 showed normal filling pressures with moderate PAH. He additionally has chronic hiccups followed by Dr. Benson Norway, now on gabapentin. PFTs in 6/22 showed mixed picture with severe obstruction, moderate restriction, and severely decreased DLCO.   Admitted 8/25-8/30/22 with A/C HF exacerbation. He was aggressively diuresed with lasix/metolazone, had unna boots, farxiga added.  Midodrine was increased for hypotension. Hospitalization c/b AKI on CKD III.  Readmitted 9/1-9/12/22 with shock (mixed septic and cardiogenic), likely 2/2 to aspiration pneumonia and A/C CHF.  Suspect possible gut translocation with ileus/partial SBO; CT abdomen with "nutcracker phenomenon".  He was started on NE + antibiotics. General surgery consulted and felt not to be a surgical candidate. Diuretics initially held due to over-diuresis and shock. Eventually able to wean pressors off, restart PO torsemide and midodrine. Hospitalization c/b transaminitis, delirium, and BPH with urinary retention, requiring foley catheter. Palliative care was consulted for [place] and patient DNR/DNI. PT/OT recommended HH. He was discharged home, weight 220 lbs.  EGD 11/22 showed esophagitis, hiatal hernia and yeast.   Acute visit 07/17/21 for weakness/falls. He had a syncopal episode in clinic. Labs showed hypokalemia of 3.3, SCr elevated at 2.13,  ReDs 27%. [place] stopped, torsemide decreased to 40 mg daily and compression hose and abdominal binder ordered. 3-day Zio placed to evaluate for possible arrhythmogenic cause of symptoms.  Zio monitor showed 19 second run of VT.   Follow up 1/23, rare atypical chest pain. NYHA III, weight continued to trend down.  Admitted 2/23 with PNA and a/c CHF. Seen by palliative care and remained full code.  Admitted 8/23 with septic shock due to aspiration PNA & possible colitis. Echo showed EF 60-65%, RV severely reduced, severely enlarged, RVSP 50, LVEF 60-65%, no RWMA, mild TR, trivial MR. AHF consulted for elevated Hs trop. He underwent R/LHC showing nonobstructive mild CAD, normal/low filling pressures and mild PAH. Troponin elevation likely due to demand ischemia. Hospitalization c/b AKI on CKD. He was restarted on Farxiga and lower dose torsemide (10 mg daily). He was discharged home, weight 175 lbs.   He was admitted 11/23 with a/c respiratory failure, recently discharged (10/23)  after COVID-19 infection. Felt to be septic 2/2 PNA vs UTI. Received IV abx. HsTrop elevated on admission but felt to be demand ischemia. Stable from a HF standpoint and continued on torsemide, midodrine and Farxiga. He was discharged home with HH, weight 188 lbs.  Today he returns for post hospital HF follow up. Overall feeling fine. Remains on 2L oxygen. Sleeping a lot and energy is poor. He is SOB walking up steps but does OK with ADLs or walking around the house. No falls. Denies palpitations, abnormal bleeding, CP, dizziness, edema, or PND/Orthopnea. Appetite ok. No fever or chills. Weight at home 175-176 pounds. Taking all medications. Has HH PT twice a week. Has productive cough, blood tinged first thing in the AM.  ECG (personally reviewed): NSR, RBBB QRS 158 msec, 72 bpm  Labs (4/22): K 4.8, creatinine 1.67 Labs (6/22): K 3.8, creatinine 1.72, ANA negative, RF 39.6 but CCP negative, SCL-70 negative, BNP 1231, HIV  negative Labs (7/22): K 3.8, creatine 1.67 Labs (8/22): K 3.6, creatinine 1.6, hgb 15.3  Labs (9/22): K 5.0, creatinine 1.2, hgb 14.6 Labs (10/22): K 3.4, creatinine 1.37 Labs (10/22): K 3.5, creatinine 1.34 Labs (12/22): K 4.5, creatinine 1.81 Labs (2/23): K 3.8, creatinine 1.20 Labs (5/23): K 4.3, creatinine 1.5, BNP 84 Labs (8/23): K 4.5, creatinine 1.00 Labs (10/23): K 3.9, creatinine 1.49, AST 43, ALT normal, BNP 1164 Labs (12/23): K 4.1, creatinine 1.17  6 minute walk (7/22): 213 m 6 minute walk (10/22): 274 m (oxygen saturation dropped to 70s on 2L [place])  PMH: 1. HFPEF: With prominent RV failure.   - Echo (2/22): EF 55-60%, IV septum flattened, severe RV enlargement, severely decreased RV function, PASP 57 mmHg.  - Echo (8/23): EF 60-65%, RV severely reduced, severely enlarged, RVSP 50, LVEF 60-65%, no RWMA, mild TR, trivial MR.  2. Venous thromboembolic disease: PE in 8416.   - Venous dopplers (4/22): No DVT.  - CTA chest (4/22): No PE.  - V/Q scan 5/22 with perfusion defect in the RML consistent with chronic PE.  3. OSA: Waiting on CPAP.  4. HTN 5. COPD: Prior smoker.   - CTA chest in 4/22 showed no PE, mild emphysema.  - PFTs (6/22) with severe obstruction, moderate restriction, severely decreased DLCO 6. Pulmonary hypertension: RHC (4/22) with mean RA 5, PA 65/19 mean 36, mean PCWP 5, CI 2.19, PVR 6.1 WU, PAPi 9.2.  - RHC (8/23) with mean RA 1, PA 46/10 mean 25, mean PCWP 4, CI 2.98, PVR 3.4 WU 7. Chronic hiccups 8. BPH: Has to in and out cath at times. 9. Syncope: Zio 12/22 with 19 second VT run.  10. Chronic hiccups 11. 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 12. CAD: LHC (8/23) with nonobstructive CAD.   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 Stopped working in  2022  Tobacco Use   Smoking status: Former    Packs/day: 1.00    Years: 50.00    Total pack years: 50.00    Types: Cigarettes    Start date: 52    Quit date: 08/19/2006    Years since quitting: 16.0   Smokeless tobacco: Never   Tobacco comments:    Former smoke 03/15/22  Vaping Use   Vaping Use: Never used  Substance and Sexual Activity   Alcohol use: Not Currently    Alcohol/week: 6.0 standard drinks of alcohol    Types: 6 Shots of liquor per week   Drug use: No   Sexual activity: Not on file  Other Topics Concern   Not on file  Social History Narrative   Not on file   Social Determinants of Health   Financial Resource Strain: Not on file  Food Insecurity: Unknown (07/18/2022)   Hunger Vital Sign    Worried About Running Out of Food in the Last Year: Patient refused    [place] in the Last Year: Patient refused  Transportation Needs: No Transportation Needs (07/18/2022)   PRAPARE - Hydrologist (Medical): No    Lack of Transportation (Non-Medical): No  Physical Activity: Not on file  Stress: No Stress Concern Present (05/09/2021)   [place]    Feeling of Stress : Only a little  Social Connections: Not on file  Intimate Partner Violence: Not At Risk (07/18/2022)   Humiliation, Afraid, Rape, and Kick questionnaire    Fear of Current or Ex-Partner: No    Emotionally Abused: No    Physically Abused: No    Sexually Abused: No   Family History  Problem Relation Age of Onset   Heart attack Brother 5   ROS: All systems reviewed and negative except as per HPI.   Current Outpatient Medications  Medication Sig Dispense Refill   albuterol (VENTOLIN HFA) 108 (90 Base) MCG/ACT inhaler Inhale 1 puff into the lungs every 4 (four) hours as needed  for shortness of breath.     allopurinol (ZYLOPRIM) 100 MG tablet Take 100 mg by mouth every morning.     atorvastatin (LIPITOR) 40 MG tablet Take 1 tablet (40 mg total) by mouth daily. 30 tablet 6   benzonatate (TESSALON) 100 MG capsule Take 1 capsule (100 mg total) by mouth 3 (three) times daily as needed for cough. (Patient not taking: Reported on 08/09/2022) 20 capsule 0   Cinnamon 500 MG capsule Take 500 mg by mouth every morning.     dapagliflozin propanediol (FARXIGA) 10 MG TABS tablet Take 1 tablet (10 mg total) by mouth daily before breakfast. 90 tablet 3   ELIQUIS 5 MG TABS tablet TAKE 1 TABLET BY MOUTH TWICE A DAY (Patient taking differently: Take 5 mg by mouth 2 (two) times daily.) 60 tablet 11   finasteride (PROSCAR) 5 MG tablet Take 5 mg by mouth at bedtime.     gabapentin (NEURONTIN) 300 MG capsule Take 300 mg by mouth 3 (three) times daily.     LINZESS 72 MCG capsule Take 72 mcg by mouth every morning.     magnesium oxide (MAG-OX) 400 (240 Mg) MG tablet TAKE 1 TABLET BY MOUTH EVERY DAY (Patient taking differently: Take 400 mg by mouth daily.) 90 tablet 1   metoCLOPramide (REGLAN) 5 MG tablet Take 5 mg by mouth 3 (three) times daily before meals.     midodrine (PROAMATINE) 5 MG tablet Take 3 tablets (15 mg total) by mouth 3 (three) times daily. 810 tablet 3   Multiple Vitamin (MULTIVITAMIN WITH MINERALS) TABS tablet Take 1 tablet by mouth daily. 30 tablet 0   OXYGEN Inhale 2 L into the lungs continuous.     pantoprazole (PROTONIX) 40 MG tablet Take 40 mg by mouth every morning.     Riociguat 2 MG TABS Take 2 mg by mouth in the morning, at noon, and at bedtime. 90 tablet 11   STIOLTO RESPIMAT 2.5-2.5 MCG/ACT AERS INHALE 2 PUFFS BY MOUTH INTO THE LUNGS DAILY 4 g 5   torsemide (DEMADEX) 20 MG tablet Take 20 mg as needed for weight gain, swelling, or increased shortness of breath 15 tablet 6   No current facility-administered medications for this visit.   Wt Readings from Last 3  Encounters:  08/09/22 81.6 kg (180 lb)  07/19/22 85.8 kg (189 lb 2.5 oz)  06/28/22 82.5 kg (181 lb 12.8 oz)   There were no vitals taken for this visit. Physical Exam General:  NAD. No resp  difficulty, walked into clinic on oxygen, elderly HEENT: Normal Neck: Supple. No JVD. Carotids 2+ bilat; no bruits. No lymphadenopathy or thryomegaly appreciated. Cor: PMI nondisplaced. Regular rate & rhythm. No rubs, gallops or murmurs. Lungs: Clear Abdomen: Soft, nontender, nondistended. No hepatosplenomegaly. No bruits or masses. Good bowel sounds. Extremities: No cyanosis, clubbing, rash, edema Neuro: Alert & oriented x 3, cranial nerves grossly intact. Moves all 4 extremities w/o difficulty. Affect pleasant.  Assessment/Plan: 1. Chronic HFpEF/RV failure: Echo (2/22) with EF 55-60%, IV septum flattened, severe RV enlargement, severely decreased RV function, PASP 57 mmHg.  He has severe RV failure at baseline.  Echo this admit 8/23 >>RV severely reduced, severely enlarged, RVSP 50, LVEF 60-65%, no RWMA, mild TR, trivial MR.  RHC 8/23 showed normal/low filling pressures and mild PAH. NYHA II-III, function capacity confounded by respiratory issues and physical deconditioning. He is not volume overloaded on exam. - Continue torsemide 20 mg daily. - Continue midodrine 15 mg tid. Would avoid fludrocortisone. - Continue dapagliflozin 10 mg daily.  No GU symptoms. 2. Pulmonary HTN: PAH noted on 4/22 RHC with PVR 6.1 WU.  This appears to be multifactorial with OSA, severe emphysema, and a suspected chronic PE involving the right middle lobe (group 3 and group 4 PH). Given the suspected mixed etiology with only 1 area of chronic thromboembolism (right middle lobe) as well as age, do not think that pulmonary thromboendarterectomy would be indicated.  Rheumatologic serologic workup was negative.  PFTs showed severe obstruction and moderate restriction, suggesting significant COPD. [place] 8/23 showed mild pulmonary  hypertension with PVR 3.4 WU.  - Continue riociguat.  - Holding off Tyvaso for now, suspect large group 3 PH component and only mildly elevated PA pressure on 8/23 RHC.  3. Chronic respiratory failure: Admission 10/23 for COVID. Recent admission for PNA.  - Completed abx - Stable on 2L [place] 4. CKD III: BMET today. 5. CAD: nonobstructive on [facility] 8/23. No chest pain - Continue statin.  6. OSA: Moderate OSA on sleep study. He refused CPAP. 7. Emphysema: Prior smoker.  Emphysema on CT and severe obstruction on PFTs. On 2L home oxygen. Currently on 2L. 8. Chronic PE: Diagnosed by V/Q scan.  No abnormal bleeding. He has a probable chronic RLE DVT.   - Continue Eliquis. No bleeding issues. 9. Hiatial hernia w/ esophagitis: Has significant GERD. He follows with Dr. Benson Norway. - Continue PPI.  9. Nutcracker SMA: Compresses duodenum 10. Urinary retention: Continue finasteride 11. Physical Deconditioning: Continue HH PT.   Follow up in 8 weeks with APP and 4-5 months with Dr. Aundra Dubin.  Allena Katz, FNP-BC 09/20/22

## 2022-09-23 ENCOUNTER — Ambulatory Visit (HOSPITAL_COMMUNITY)
Admission: RE | Admit: 2022-09-23 | Discharge: 2022-09-23 | Disposition: A | Discharge status: Home / Self Care | Payer: Medicare Other | Source: Ambulatory Visit | Attending: Family Medicine | Admitting: Family Medicine

## 2022-09-23 ENCOUNTER — Encounter (HOSPITAL_COMMUNITY): Payer: Self-pay

## 2022-09-23 ENCOUNTER — Ambulatory Visit: Payer: Medicare Other | Admitting: Nurse Practitioner

## 2022-09-23 VITALS — BP 116/70 | HR 79 | Wt 181.6 lb

## 2022-09-23 DIAGNOSIS — J9611 Chronic respiratory failure with hypoxia: Secondary | ICD-10-CM | POA: Diagnosis not present

## 2022-09-23 DIAGNOSIS — J9610 Chronic respiratory failure, unspecified whether with hypoxia or hypercapnia: Secondary | ICD-10-CM | POA: Insufficient documentation

## 2022-09-23 DIAGNOSIS — G4733 Obstructive sleep apnea (adult) (pediatric): Secondary | ICD-10-CM | POA: Insufficient documentation

## 2022-09-23 DIAGNOSIS — Z7901 Long term (current) use of anticoagulants: Secondary | ICD-10-CM | POA: Diagnosis not present

## 2022-09-23 DIAGNOSIS — Z79899 Other long term (current) drug therapy: Secondary | ICD-10-CM | POA: Insufficient documentation

## 2022-09-23 DIAGNOSIS — J432 Centrilobular emphysema: Secondary | ICD-10-CM | POA: Diagnosis not present

## 2022-09-23 DIAGNOSIS — I130 Hypertensive heart and chronic kidney disease with heart failure and stage 1 through stage 4 chronic kidney disease, or unspecified chronic kidney disease: Secondary | ICD-10-CM | POA: Insufficient documentation

## 2022-09-23 DIAGNOSIS — K2100 Gastro-esophageal reflux disease with esophagitis, without bleeding: Secondary | ICD-10-CM | POA: Insufficient documentation

## 2022-09-23 DIAGNOSIS — R338 Other retention of urine: Secondary | ICD-10-CM | POA: Insufficient documentation

## 2022-09-23 DIAGNOSIS — N183 Chronic kidney disease, stage 3 unspecified: Secondary | ICD-10-CM | POA: Insufficient documentation

## 2022-09-23 DIAGNOSIS — J439 Emphysema, unspecified: Secondary | ICD-10-CM | POA: Insufficient documentation

## 2022-09-23 DIAGNOSIS — I5032 Chronic diastolic (congestive) heart failure: Secondary | ICD-10-CM | POA: Diagnosis not present

## 2022-09-23 DIAGNOSIS — K56600 Partial intestinal obstruction, unspecified as to cause: Secondary | ICD-10-CM

## 2022-09-23 DIAGNOSIS — K449 Diaphragmatic hernia without obstruction or gangrene: Secondary | ICD-10-CM

## 2022-09-23 DIAGNOSIS — Z7984 Long term (current) use of oral hypoglycemic drugs: Secondary | ICD-10-CM | POA: Diagnosis not present

## 2022-09-23 DIAGNOSIS — I2782 Chronic pulmonary embolism: Secondary | ICD-10-CM | POA: Insufficient documentation

## 2022-09-23 DIAGNOSIS — I2510 Atherosclerotic heart disease of native coronary artery without angina pectoris: Secondary | ICD-10-CM | POA: Diagnosis not present

## 2022-09-23 DIAGNOSIS — Z86711 Personal history of pulmonary embolism: Secondary | ICD-10-CM

## 2022-09-23 DIAGNOSIS — R042 Hemoptysis: Secondary | ICD-10-CM | POA: Insufficient documentation

## 2022-09-23 DIAGNOSIS — I272 Pulmonary hypertension, unspecified: Secondary | ICD-10-CM | POA: Diagnosis not present

## 2022-09-23 DIAGNOSIS — Z87891 Personal history of nicotine dependence: Secondary | ICD-10-CM | POA: Insufficient documentation

## 2022-09-23 LAB — CBC
HCT: 36 % — ABNORMAL LOW (ref 39.0–52.0)
Hemoglobin: 11.4 g/dL — ABNORMAL LOW (ref 13.0–17.0)
MCH: 26.6 pg (ref 26.0–34.0)
MCHC: 31.7 g/dL (ref 30.0–36.0)
MCV: 83.9 fL (ref 80.0–100.0)
Platelets: 202 10*3/uL (ref 150–400)
RBC: 4.29 MIL/uL (ref 4.22–5.81)
RDW: 15.9 % — ABNORMAL HIGH (ref 11.5–15.5)
WBC: 6.6 10*3/uL (ref 4.0–10.5)
nRBC: 0 % (ref 0.0–0.2)

## 2022-09-23 LAB — BASIC METABOLIC PANEL
Anion gap: 11 (ref 5–15)
BUN: 28 mg/dL — ABNORMAL HIGH (ref 8–23)
CO2: 35 mmol/L — ABNORMAL HIGH (ref 22–32)
Calcium: 10 mg/dL (ref 8.9–10.3)
Chloride: 91 mmol/L — ABNORMAL LOW (ref 98–111)
Creatinine, Ser: 1.64 mg/dL — ABNORMAL HIGH (ref 0.61–1.24)
GFR, Estimated: 41 mL/min — ABNORMAL LOW (ref >60)
Glucose, Bld: 92 mg/dL (ref 70–99)
Potassium: 3.6 mmol/L (ref 3.5–5.1)
Sodium: 137 mmol/L (ref 135–145)

## 2022-09-23 LAB — BRAIN NATRIURETIC PEPTIDE: B Natriuretic Peptide: 90.1 pg/mL (ref 0.0–100.0)

## 2022-09-23 NOTE — Patient Instructions (Signed)
Medication Changes:  None. Continue current regimen.  Lab Work:  Labs done today, your results will be available in MyChart, we will contact you for abnormal readings.  Testing/Procedures:  Your provider has order a chest xray to investigate your complaints of hemoptysis further. This is to be completed today, we will help you check in with the radiology department.   Special Instructions // Education:  Do the following things EVERYDAY: Weigh yourself in the morning before breakfast. Write it down and keep it in a log. Take your medicines as prescribed Eat low salt foods--Limit salt (sodium) to 2000 mg per day.  Stay as active as you can everyday Limit all fluids for the day to less than 2 liters  Follow-Up in: 3 months with Dr. Aundra Dubin. Please call our clinic in April to schedule an appointment for May with Dr. Aundra Dubin.      At the [facility], you and your health needs are our priority. We have a designated team specialized in the treatment of Heart Failure. This Care Team includes your primary Heart Failure Specialized Cardiologist (physician), Advanced Practice Providers (APPs- Physician Assistants and Nurse Practitioners), and Pharmacist who all work together to provide you with the care you need, when you need it.   You may see any of the following providers on your designated Care Team at your next follow up:  Dr. Glori Bickers Dr. Loralie Champagne Dr. Roxana Hires, NP Lyda Jester, [place] [facility] [place], [place] Forestine Na, NP Audry Riles, PharmD   Please be sure to bring in all your medications bottles to every appointment.   Need to Contact us:  If you have any questions or concerns before your next appointment please send [place] a message through [place] or call our office at [phone].    TO LEAVE A MESSAGE FOR THE NURSE SELECT OPTION 2, PLEASE LEAVE A MESSAGE INCLUDING: YOUR NAME DATE OF BIRTH CALL BACK  NUMBER REASON FOR CALL**this is important as we prioritize the call backs  YOU WILL RECEIVE A CALL BACK THE SAME DAY AS LONG AS YOU CALL BEFORE 4:00 PM

## 2022-09-24 ENCOUNTER — Ambulatory Visit (INDEPENDENT_AMBULATORY_CARE_PROVIDER_SITE_OTHER): Payer: Medicare Other | Admitting: Pulmonary Disease

## 2022-09-24 ENCOUNTER — Encounter: Payer: Self-pay | Admitting: Pulmonary Disease

## 2022-09-24 VITALS — BP 98/58 | HR 77 | Ht 75.0 in | Wt 182.8 lb

## 2022-09-24 DIAGNOSIS — J432 Centrilobular emphysema: Secondary | ICD-10-CM

## 2022-09-24 DIAGNOSIS — J189 Pneumonia, unspecified organism: Secondary | ICD-10-CM | POA: Diagnosis not present

## 2022-09-24 DIAGNOSIS — R042 Hemoptysis: Secondary | ICD-10-CM | POA: Diagnosis not present

## 2022-09-24 MED ORDER — DOXYCYCLINE HYCLATE 100 MG PO TABS: 100.0000 mg | ORAL_TABLET | Freq: Two times a day (BID) | ORAL | 0 refills | Status: DC

## 2022-09-24 MED ORDER — PREDNISONE 10 MG PO TABS: ORAL_TABLET | ORAL | 0 refills | Status: AC

## 2022-09-24 NOTE — Progress Notes (Signed)
Synopsis: Referred in February 2024 for acute visit  Subjective:   PATIENT ID: [name]: male DOB: August 07, 1936, MRN: [id number]  HPI  Chief Complaint  Patient presents with   Follow-up    Pt acute visit, he reports that he has been coughing a lot and having dark red mixed w/ white mucous. Denies fevers, nasal congestion, SOB is baseline. Reports last week he had to increase his O2 last week for a couple days   [name] is an 87 year old male, former smoker with COPD, chronic respiratory failure, chronic pulmonary emboli and CTEPH who returns to pulmonary clinic for acute visit.   He reports increased cough and mucous production with blood mixed in over the past couple of weeks. He denies fevers or chills. He does have increased shortness of breath. No altered mentation per the wife.   Chest x-ray yesterday shows new right lower lobe infiltrate. BNP not elevated. He has stopped eliquis starting today.   Past Medical History:  Diagnosis Date   Arthritis    BPH (benign prostatic hyperplasia)    Chronic hiccups    COPD (chronic obstructive pulmonary disease) (HCC)    Folliculitis    posterior scalp per office visit note of Dr Maudie Mercury 07/20/2014     GERD (gastroesophageal reflux disease)    Gout    Hypertension    Hypotension    NSTEMI (non-ST elevated myocardial infarction) ([place]) 03/19/2022   PE (pulmonary thromboembolism) ([place])    Phlebitis    right arm  at least 20 years ago    Pneumonia    hx of pneumonia as a child    Pulmonary embolism ([place]) 03/17/2022   Pulmonary hypertension ([place])    Severe sepsis ([place]) 03/16/2022   Sleep apnea      Family History  Problem Relation Age of Onset   Heart attack Brother 41     Social History   Socioeconomic History   Marital status: Married    Spouse name: Joann   Number of children: 2   Years of education: 14   Highest education level: Not on file  Occupational History   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 Stopped working in  2022  Tobacco Use   Smoking status: Former    Packs/day: 1.00    Years: 50.00    Total pack years: 50.00    Types: Cigarettes    Start date: 23    Quit date: 08/19/2006    Years since quitting: 16.1   Smokeless tobacco: Never   Tobacco comments:    Former smoke 03/15/22  Vaping Use   Vaping Use: Never used  Substance and Sexual Activity   Alcohol use: Not Currently    Alcohol/week: 6.0 standard drinks of alcohol    Types: 6 Shots of liquor per week   Drug use: No   Sexual activity: Not on file  Other Topics Concern   Not on file  Social History Narrative   Not on file   Social Determinants of Health   Financial Resource Strain: Not on file  Food Insecurity: Unknown (07/18/2022)   Hunger Vital Sign    Worried About Running Out of Food in the Last Year: Patient refused    Magazine in the Last Year: Patient refused  Transportation Needs: No Transportation Needs (07/18/2022)   PRAPARE - Hydrologist (Medical): No    Lack of Transportation (Non-Medical): No  Physical Activity: Not on file  Stress: No Stress Concern Present (05/09/2021)   [employer] of Occupational Health - Occupational Stress Questionnaire    Feeling of Stress : Only a little  Social Connections: Not on file  Intimate Partner Violence: Not At Risk (07/18/2022)   Humiliation, Afraid, Rape, and Kick questionnaire    Fear of Current or Ex-Partner: No    Emotionally Abused: No    Physically Abused: No    Sexually Abused: No     Allergies  Allergen Reactions   Other Swelling    Beer- Swelling    Sunflower Oil Swelling   Sulfa Antibiotics Rash     Outpatient Medications Prior to Visit  Medication Sig Dispense Refill   albuterol (VENTOLIN HFA) 108 (90 Base) MCG/ACT inhaler Inhale 1 puff into the lungs every 4 (four) hours as needed for shortness of breath.     allopurinol (ZYLOPRIM) 100 MG tablet Take  100 mg by mouth every morning.     atorvastatin (LIPITOR) 40 MG tablet Take 1 tablet (40 mg total) by mouth daily. 30 tablet 6   Cinnamon 500 MG capsule Take 500 mg by mouth every morning.     dapagliflozin propanediol (FARXIGA) 10 MG TABS tablet Take 1 tablet (10 mg total) by mouth daily before breakfast. 90 tablet 3   ELIQUIS 5 MG TABS tablet TAKE 1 TABLET BY MOUTH TWICE A DAY 60 tablet 11   finasteride (PROSCAR) 5 MG tablet Take 5 mg by mouth at bedtime.     gabapentin (NEURONTIN) 300 MG capsule Take 300 mg by mouth 3 (three) times daily.     LINZESS 72 MCG capsule Take 72 mcg by mouth every morning.     magnesium oxide (MAG-OX) 400 (240 Mg) MG tablet TAKE 1 TABLET BY MOUTH EVERY DAY 90 tablet 1   metoCLOPramide (REGLAN) 5 MG tablet Take 5 mg by mouth 3 (three) times daily before meals.     midodrine (PROAMATINE) 5 MG tablet Take 3 tablets (15 mg total) by mouth 3 (three) times daily. 810 tablet 3   Multiple Vitamin (MULTIVITAMIN WITH MINERALS) TABS tablet Take 1 tablet by mouth daily. 30 tablet 0   OXYGEN Inhale 2 L into the lungs continuous.     pantoprazole (PROTONIX) 40 MG tablet Take 40 mg by mouth every morning.     Riociguat 2 MG TABS Take 2 mg by mouth in the morning, at noon, and at bedtime. 90 tablet 11   STIOLTO RESPIMAT 2.5-2.5 MCG/ACT AERS INHALE 2 PUFFS BY MOUTH INTO THE LUNGS DAILY 4 g 5   torsemide (DEMADEX) 20 MG tablet Take 20 mg as needed for weight gain, swelling, or increased shortness of breath (Patient taking differently: Take 20 mg by mouth daily. Take 20 mg as needed for weight gain, swelling, or increased shortness of breath) 15 tablet 6   No facility-administered medications prior to visit.   Review of Systems  Constitutional:  Negative for chills, fever, malaise/fatigue and weight loss.  HENT:  Negative for congestion, sinus pain and sore throat.   Eyes: Negative.   Respiratory:  Positive for cough, hemoptysis, sputum production and shortness of breath. Negative  for wheezing.   Cardiovascular:  Negative for chest pain, palpitations, orthopnea, claudication and leg swelling.  Gastrointestinal:  Negative for abdominal pain, heartburn, nausea and vomiting.  Genitourinary: Negative.   Musculoskeletal:  Negative for joint pain and myalgias.  Skin:  Negative for rash.  Neurological:  Negative for weakness.  Endo/Heme/Allergies: Negative.  Psychiatric/Behavioral: Negative.     Objective:   Vitals:   09/24/22 1551  BP: (!) 98/58  Pulse: 77  SpO2: 93%  Weight: 182 lb 12.8 oz (82.9 kg)  Height: 6\' 3"  (1.905 m)   Physical Exam Constitutional:      General: He is not in acute distress. HENT:     Head: Normocephalic and atraumatic.  Eyes:     Extraocular Movements: Extraocular movements intact.     Conjunctiva/sclera: Conjunctivae normal.     Pupils: Pupils are equal, round, and reactive to light.  Cardiovascular:     Rate and Rhythm: Normal rate and regular rhythm.     Pulses: Normal pulses.     Heart sounds: Normal heart sounds. No murmur heard. Pulmonary:     Breath sounds: Rales present. No wheezing or rhonchi.  Abdominal:     General: Bowel sounds are normal.     Palpations: Abdomen is soft.  Musculoskeletal:     Right lower leg: No edema.     Left lower leg: No edema.  Lymphadenopathy:     Cervical: No cervical adenopathy.  Skin:    General: Skin is warm and dry.  Neurological:     General: No focal deficit present.     Mental Status: He is alert.  Psychiatric:        Mood and Affect: Mood normal.        Behavior: Behavior normal.        Thought Content: Thought content normal.        Judgment: Judgment normal.    CBC    Component Value Date/Time   WBC 6.6 09/23/2022 1119   RBC 4.29 09/23/2022 1119   HGB 11.4 (L) 09/23/2022 1119   HGB 15.4 12/07/2020 1100   HCT 36.0 (L) 09/23/2022 1119   HCT 48.7 12/07/2020 1100   PLT 202 09/23/2022 1119   PLT 191 12/07/2020 1100   MCV 83.9 09/23/2022 1119   MCV 88 12/07/2020 1100    MCH 26.6 09/23/2022 1119   MCHC 31.7 09/23/2022 1119   RDW 15.9 (H) 09/23/2022 1119   RDW 14.0 12/07/2020 1100   LYMPHSABS 0.6 (L) 07/19/2022 0241   MONOABS 0.7 07/19/2022 0241   EOSABS 0.5 07/19/2022 0241   BASOSABS 0.0 07/19/2022 0241      Latest Ref Rng & Units 09/23/2022   11:19 AM 08/28/2022   12:03 PM 08/20/2022   11:25 AM  BMP  Glucose 70 - 99 mg/dL 92  79  10/19/2022   BUN 8 - 23 mg/dL 28  23  39   Creatinine 0.61 - 1.24 mg/dL 390  3.00  9.23   Sodium 135 - 145 mmol/L 137  139  138   Potassium 3.5 - 5.1 mmol/L 3.6  4.6  3.8   Chloride 98 - 111 mmol/L 91  100  93   CO2 22 - 32 mmol/L 35  32  34   Calcium 8.9 - 10.3 mg/dL 3.00  9.9  76.2    Chest imaging: CXR 09/23/22 New interstitial and airspace opacity at the right lung base, which may represent pulmonary hemorrhage given the history of hemoptysis, as well as multifocal infection.   Improving aeration of the left lung compared to the prior plain film.   Emphysema  PFT:    Latest Ref Rng & Units 01/29/2021    9:43 AM 12/12/2014    8:49 AM  PFT Results  FVC-Pre L 2.54  2.75   FVC-Predicted Pre % 57  63  FVC-Post L 2.69  2.85   FVC-Predicted Post % 61  66   Pre FEV1/FVC % % 60  70   Post FEV1/FCV % % 61  66   FEV1-Pre L 1.51  1.93   FEV1-Predicted Pre % 47  59   FEV1-Post L 1.65  1.89   DLCO uncorrected ml/min/mmHg 9.97  13.26   DLCO UNC% % 35  35   DLVA Predicted % 62  62   TLC L 6.26  5.41   TLC % Predicted % 77  68   RV % Predicted % 115  93     Labs:  Path:  Echo:  Heart Catheterization:    Assessment & Plan:   Community acquired pneumonia of right lower lobe of lung - Plan: predniSONE (DELTASONE) 10 MG tablet, doxycycline (VIBRA-TABS) 100 MG tablet  Centrilobular emphysema (HCC)  Hemoptysis  Discussion: Joshua [name] is an 87 year old male, former smoker with COPD, chronic respiratory failure, chronic pulmonary emboli and CTEPH who returns to pulmonary clinic for acute visit.   We will  treat him for community acquired pneumonia with doxycycline. The low grade hemoptysis is likely related to infection. Will add on prolonged steroid taper for inflammation.   He is to call with any change in symptoms. If hemoptysis increases he should head to the ER.   Follow up in 2 weeks with an APP.  Freda Jackson, MD [facility] Office: [phone]   See Amion for personal pager PCCM on call pager [phone] until 7pm. Please call Elink 7p-7a. [phone]     Current Outpatient Medications:    albuterol (VENTOLIN HFA) 108 (90 Base) MCG/ACT inhaler, Inhale 1 puff into the lungs every 4 (four) hours as needed for shortness of breath., Disp: , Rfl:    allopurinol (ZYLOPRIM) 100 MG tablet, Take 100 mg by mouth every morning., Disp: , Rfl:    atorvastatin (LIPITOR) 40 MG tablet, Take 1 tablet (40 mg total) by mouth daily., Disp: 30 tablet, Rfl: 6   Cinnamon 500 MG capsule, Take 500 mg by mouth every morning., Disp: , Rfl:    dapagliflozin propanediol (FARXIGA) 10 MG TABS tablet, Take 1 tablet (10 mg total) by mouth daily before breakfast., Disp: 90 tablet, Rfl: 3   doxycycline (VIBRA-TABS) 100 MG tablet, Take 1 tablet (100 mg total) by mouth 2 (two) times daily., Disp: 20 tablet, Rfl: 0   ELIQUIS 5 MG TABS tablet, TAKE 1 TABLET BY MOUTH TWICE A DAY, Disp: 60 tablet, Rfl: 11   finasteride (PROSCAR) 5 MG tablet, Take 5 mg by mouth at bedtime., Disp: , Rfl:    gabapentin (NEURONTIN) 300 MG capsule, Take 300 mg by mouth 3 (three) times daily., Disp: , Rfl:    LINZESS 72 MCG capsule, Take 72 mcg by mouth every morning., Disp: , Rfl:    magnesium oxide (MAG-OX) 400 (240 Mg) MG tablet, TAKE 1 TABLET BY MOUTH EVERY DAY, Disp: 90 tablet, Rfl: 1   metoCLOPramide (REGLAN) 5 MG tablet, Take 5 mg by mouth 3 (three) times daily before meals., Disp: , Rfl:    midodrine (PROAMATINE) 5 MG tablet, Take 3 tablets (15 mg total) by mouth 3 (three) times daily., Disp: 810 tablet,  Rfl: 3   Multiple Vitamin (MULTIVITAMIN WITH MINERALS) TABS tablet, Take 1 tablet by mouth daily., Disp: 30 tablet, Rfl: 0   OXYGEN, Inhale 2 L into the lungs continuous., Disp: , Rfl:    pantoprazole (PROTONIX) 40 MG tablet, Take 40  mg by mouth every morning., Disp: , Rfl:    predniSONE (DELTASONE) 10 MG tablet, Take 4 tablets (40 mg total) by mouth daily with breakfast for 3 days, THEN 3 tablets (30 mg total) daily with breakfast for 3 days, THEN 2 tablets (20 mg total) daily with breakfast for 3 days, THEN 1 tablet (10 mg total) daily with breakfast for 3 days., Disp: 30 tablet, Rfl: 0   Riociguat 2 MG TABS, Take 2 mg by mouth in the morning, at noon, and at bedtime., Disp: 90 tablet, Rfl: 11   STIOLTO RESPIMAT 2.5-2.5 MCG/ACT AERS, INHALE 2 PUFFS BY MOUTH INTO THE LUNGS DAILY, Disp: 4 g, Rfl: 5   torsemide (DEMADEX) 20 MG tablet, Take 20 mg as needed for weight gain, swelling, or increased shortness of breath (Patient taking differently: Take 20 mg by mouth daily. Take 20 mg as needed for weight gain, swelling, or increased shortness of breath), Disp: 15 tablet, Rfl: 6

## 2022-09-24 NOTE — Patient Instructions (Addendum)
I am concerned you have pneumonia of the right lower lung based on your x-ray from yesterday  Start doxycycline 100mg  twice daily for 10 days  Start prednisone taper 40mg  daily x 3 days 30mg  daily x 3 days 20mg  daily x 3 days 10mg  daily x 3 days  Continue stiolto 2 puffs daily and as needed albuterol  Follow up in 2 weeks with an APP

## 2022-10-01 ENCOUNTER — Other Ambulatory Visit (HOSPITAL_COMMUNITY): Payer: Self-pay | Admitting: Family Medicine

## 2022-10-08 ENCOUNTER — Ambulatory Visit (INDEPENDENT_AMBULATORY_CARE_PROVIDER_SITE_OTHER): Payer: Medicare Other | Admitting: Nurse Practitioner

## 2022-10-08 ENCOUNTER — Encounter: Payer: Self-pay | Admitting: Nurse Practitioner

## 2022-10-08 VITALS — BP 90/60 | HR 51 | Ht 75.0 in | Wt 178.0 lb

## 2022-10-08 DIAGNOSIS — I272 Pulmonary hypertension, unspecified: Secondary | ICD-10-CM | POA: Diagnosis not present

## 2022-10-08 DIAGNOSIS — J439 Emphysema, unspecified: Secondary | ICD-10-CM

## 2022-10-08 DIAGNOSIS — J9611 Chronic respiratory failure with hypoxia: Secondary | ICD-10-CM

## 2022-10-08 DIAGNOSIS — J189 Pneumonia, unspecified organism: Secondary | ICD-10-CM | POA: Diagnosis not present

## 2022-10-08 DIAGNOSIS — I959 Hypotension, unspecified: Secondary | ICD-10-CM | POA: Diagnosis not present

## 2022-10-08 NOTE — Assessment & Plan Note (Signed)
RLL infiltrate with productive cough and hemoptysis. He has completed empiric course of doxycycline with clinical improvement. No further episodes of hemoptysis. Plan to repeat imaging in 6 weeks to ensure resolution. Advised him to continue with bronchodilator and mucociliary clearance therapies.   Patient Instructions  Continue Albuterol inhaler 2 puffs every 6 hours as needed for shortness of breath or wheezing. Notify if symptoms persist despite rescue inhaler/neb use.  Continue Stiolto 2 puffs Twice daily  Continue supplemental oxygen 2 lpm for goal oxygen >88-90%  Monitor your blood pressure at home for goal >100/60. If unable to stay above this, may need to consider adjusting your midodrine or fluid pills. Follow up with your heart doctor to discuss  Follow up in 6 weeks with [name] or Alanson Aly and repeat chest x ray. If symptoms do not improve or worsen, please contact office for sooner follow up or seek emergency care.

## 2022-10-08 NOTE — Progress Notes (Signed)
$@Patientj$  ID: Joshua [name], male    DOB: [birth year], 87 y.o.   MRN: [id number]  Chief Complaint  Patient presents with   Follow-up    Referring provider: [name], DO  HPI: 87 year old male, former smoker followed for COPD, chronic pulmonary emboli, CTEPH and chronic respiratory failure on supplemental O2.  He is a patient Dr. Bari Mantis and last seen in office 09/24/2022 for acute visit by Dr. Erin Fulling.  Past medical history significant for CHF, pulmonary hypertension, CKD stage III, CAD, OSA, GERD, HLD.  TEST/EVENTS:  01/29/2021 PFT: FVC 57, FEV1 47, ratio 61, TLC 77, DLCOunc 35 03/18/2022 echo: EF 60 to 65%.  G1 DD.  RV systolic function severely reduced and enlarged.  Moderately elevated PASP.  Trivial MR.  Mild to moderate AR. 09/23/2022 CXR: New interstitial and airspace opacity at the right lung base.  Since previous exam there is improved aeration of the left lung.  Emphysematous changes.  09/24/2022: OV with Dr. Erin Fulling.  Increased cough and mucus production with blood mixed in over the past couple weeks.  Denies fever or chills.  Does have increased shortness of breath.  No altered mentation per the wife.  Chest x-ray showed a new right lower lobe infiltrate.  BNP not elevated.  Treated for pneumonia with doxycycline.  Added on prolonged steroid taper for inflammation.  Provided with strict ED precautions.  10/08/2022: Today-follow-up Patient presents today with his wife for follow-up after being treated for right lower lobe pneumonia with doxycycline course and prednisone taper for associated AECOPD.  He is feeling much better today.  His cough seems to be improved.  Still has a little bit of residual cough at night but it is minimally productive with clear phlegm.  No further episodes of hemoptysis.  Breathing feels like it is getting back to his baseline.  Denies any increased chest congestion, wheezing, fevers, chills.  Tolerated his antibiotics and steroids well.  He is on Stiolto for  maintenance.  Currently using 2 L supplemental O2 without any desaturations at home.  Allergies  Allergen Reactions   Other Swelling    Beer- Swelling    Sunflower Oil Swelling   Sulfa Antibiotics Rash    Immunization History  Administered Date(s) Administered   DTaP 08/20/2007   Influenza, High Dose Seasonal PF 07/20/2014, 07/27/2015, 05/27/2017   Influenza, Quadrivalent, Recombinant, Inj, Pf 05/16/2021   Influenza,inj,Quad PF,6+ Mos 07/19/2014, 05/29/2015, 05/07/2018   Influenza-Unspecified 07/31/2011, 06/11/2012, 06/16/2013, 05/19/2016   Moderna Sars-Covid-2 Vaccination 09/01/2019, 09/29/2019, 06/11/2020, 11/24/2020, 12/04/2020   PNEUMOCOCCAL CONJUGATE-20 07/19/2022   Pneumococcal Polysaccharide-23 10/14/2005, 12/29/2008   Tdap 08/01/2018    Past Medical History:  Diagnosis Date   Arthritis    BPH (benign prostatic hyperplasia)    Chronic hiccups    COPD (chronic obstructive pulmonary disease) (HCC)    Folliculitis    posterior scalp per office visit note of Dr Maudie Mercury 07/20/2014     GERD (gastroesophageal reflux disease)    Gout    Hypertension    Hypotension    NSTEMI (non-ST elevated myocardial infarction) ([place]) 03/19/2022   PE (pulmonary thromboembolism) ([place])    Phlebitis    right arm  at least 20 years ago    Pneumonia    hx of pneumonia as a child    Pulmonary embolism ([place]) 03/17/2022   Pulmonary hypertension (HCC)    Severe sepsis ([place]) 03/16/2022   Sleep apnea     Tobacco History: Social History   Tobacco Use  Smoking Status Former  Packs/day: 1.00   Years: 50.00   Total pack years: 50.00   Types: Cigarettes   Start date: 78   Quit date: 08/19/2006   Years since quitting: 16.1  Smokeless Tobacco Never  Tobacco Comments   Former smoke 03/15/22   Counseling given: Not Answered Tobacco comments: Former smoke 03/15/22   Outpatient Medications Prior to Visit  Medication Sig Dispense Refill   albuterol (VENTOLIN HFA) 108 (90 Base) MCG/ACT inhaler  Inhale 1 puff into the lungs every 4 (four) hours as needed for shortness of breath.     allopurinol (ZYLOPRIM) 100 MG tablet Take 100 mg by mouth every morning.     atorvastatin (LIPITOR) 40 MG tablet TAKE 1 TABLET BY MOUTH EVERY DAY 90 tablet 2   Cinnamon 500 MG capsule Take 500 mg by mouth every morning.     dapagliflozin propanediol (FARXIGA) 10 MG TABS tablet Take 1 tablet (10 mg total) by mouth daily before breakfast. 90 tablet 3   doxycycline (VIBRA-TABS) 100 MG tablet Take 1 tablet (100 mg total) by mouth 2 (two) times daily. 20 tablet 0   ELIQUIS 5 MG TABS tablet TAKE 1 TABLET BY MOUTH TWICE A DAY 60 tablet 11   finasteride (PROSCAR) 5 MG tablet Take 5 mg by mouth at bedtime.     gabapentin (NEURONTIN) 300 MG capsule Take 300 mg by mouth 3 (three) times daily.     LINZESS 72 MCG capsule Take 72 mcg by mouth every morning.     magnesium oxide (MAG-OX) 400 (240 Mg) MG tablet TAKE 1 TABLET BY MOUTH EVERY DAY 90 tablet 1   metoCLOPramide (REGLAN) 5 MG tablet Take 5 mg by mouth 3 (three) times daily before meals.     midodrine (PROAMATINE) 5 MG tablet Take 3 tablets (15 mg total) by mouth 3 (three) times daily. 810 tablet 3   Multiple Vitamin (MULTIVITAMIN WITH MINERALS) TABS tablet Take 1 tablet by mouth daily. 30 tablet 0   OXYGEN Inhale 2 L into the lungs continuous.     pantoprazole (PROTONIX) 40 MG tablet Take 40 mg by mouth every morning.     Riociguat 2 MG TABS Take 2 mg by mouth in the morning, at noon, and at bedtime. 90 tablet 11   STIOLTO RESPIMAT 2.5-2.5 MCG/ACT AERS INHALE 2 PUFFS BY MOUTH INTO THE LUNGS DAILY 4 g 5   torsemide (DEMADEX) 20 MG tablet Take 20 mg as needed for weight gain, swelling, or increased shortness of breath (Patient taking differently: Take 20 mg by mouth daily. Take 20 mg as needed for weight gain, swelling, or increased shortness of breath) 15 tablet 6   No facility-administered medications prior to visit.     Review of Systems:   Constitutional: No  weight loss or gain, night sweats, fevers, chills. +fatigue, lassitude (improving) HEENT: No headaches, difficulty swallowing, tooth/dental problems, or sore throat. No sneezing, itching, ear ache, nasal congestion, or post nasal drip CV:  No chest pain, orthopnea, PND, swelling in lower extremities, anasarca, dizziness, palpitations, syncope Resp: +shortness of breath with exertion (improving); minimal nocturnal cough. No excess mucus or change in color of mucus. No hemoptysis. No wheezing.  No chest wall deformity GI:  No heartburn, indigestion, abdominal pain, nausea, vomiting, diarrhea, change in bowel habits, loss of appetite, bloody stools.  GU: No dysuria, change in color of urine, urgency or frequency. Skin: No rash, lesions, ulcerations MSK:  No joint pain or swelling.   Neuro: No dizziness or lightheadedness.  Psych: No  depression or anxiety. Mood stable.     Physical Exam:  BP 90/60 (BP Location: Right Arm)   Pulse (!) 51   Ht 6' 3"$  (1.905 m)   Wt 178 lb (80.7 kg)   SpO2 99% Comment: On 2L of oxygen  BMI 22.25 kg/m   GEN: Pleasant, interactive, well-kempt; in no acute distress HEENT:  Normocephalic and atraumatic. PERRLA. Sclera white. Nasal turbinates pink, moist and patent bilaterally. No rhinorrhea present. Oropharynx pink and moist, without exudate or edema. No lesions, ulcerations, or postnasal drip.  NECK:  Supple w/ fair ROM. No JVD present. Normal carotid impulses w/o bruits. Thyroid symmetrical with no goiter or nodules palpated. No lymphadenopathy.   CV: RRR, no m/r/g, no peripheral edema. Pulses intact, +2 bilaterally. No cyanosis, pallor or clubbing. PULMONARY:  Unlabored, regular breathing. Diminished bilaterally A&P w/o wheezes/rales/rhonchi. No accessory muscle use.  GI: BS present and normoactive. Soft, non-tender to palpation. No organomegaly or masses detected.  MSK: No erythema, warmth or tenderness. Cap refil <2 sec all extrem. No deformities or joint  swelling noted. Muscle wasting Neuro: A/Ox3. No focal deficits noted.   Skin: Warm, no lesions or rashe Psych: Normal affect and behavior. Judgement and thought content appropriate.     Lab Results:  CBC    Component Value Date/Time   WBC 6.6 09/23/2022 1119   RBC 4.29 09/23/2022 1119   HGB 11.4 (L) 09/23/2022 1119   HGB 15.4 12/07/2020 1100   HCT 36.0 (L) 09/23/2022 1119   HCT 48.7 12/07/2020 1100   PLT 202 09/23/2022 1119   PLT 191 12/07/2020 1100   MCV 83.9 09/23/2022 1119   MCV 88 12/07/2020 1100   MCH 26.6 09/23/2022 1119   MCHC 31.7 09/23/2022 1119   RDW 15.9 (H) 09/23/2022 1119   RDW 14.0 12/07/2020 1100   LYMPHSABS 0.6 (L) 07/19/2022 0241   MONOABS 0.7 07/19/2022 0241   EOSABS 0.5 07/19/2022 0241   BASOSABS 0.0 07/19/2022 0241    BMET    Component Value Date/Time   NA 137 09/23/2022 1119   NA 137 12/07/2020 1100   K 3.6 09/23/2022 1119   CL 91 (L) 09/23/2022 1119   CO2 35 (H) 09/23/2022 1119   GLUCOSE 92 09/23/2022 1119   BUN 28 (H) 09/23/2022 1119   BUN 31 (H) 12/07/2020 1100   CREATININE 1.64 (H) 09/23/2022 1119   CALCIUM 10.0 09/23/2022 1119   GFRNONAA 41 (L) 09/23/2022 1119   GFRAA >60 08/01/2018 1056    BNP    Component Value Date/Time   BNP 90.1 09/23/2022 1119     Imaging:  DG Chest 2 View  Result Date: 09/23/2022 CLINICAL DATA:  87 year old male with hemoptysis EXAM: CHEST - 2 VIEW COMPARISON:  07/16/2022 FINDINGS: Cardiomediastinal silhouette improved in diameter with borderline cardiomegaly. New interstitial and airspace opacity at the right lung base. Since the prior there is improved aeration of the left lung. No pneumothorax. No large pleural effusion. Stigmata of emphysema, with increased retrosternal airspace, flattened hemidiaphragms, increased AP diameter, and hyperinflation on the AP view. Degenerative changes of the spine.  No displaced fracture IMPRESSION: New interstitial and airspace opacity at the right lung base, which may  represent pulmonary hemorrhage given the history of hemoptysis, as well as multifocal infection. Improving aeration of the left lung compared to the prior plain film. Emphysema Electronically Signed   By: Corrie Mckusick D.O.   On: 09/23/2022 16:21         Latest Ref Rng & Units 01/29/2021  9:43 AM 12/12/2014    8:49 AM  PFT Results  FVC-Pre L 2.54  2.75   FVC-Predicted Pre % 57  63   FVC-Post L 2.69  2.85   FVC-Predicted Post % 61  66   Pre FEV1/FVC % % 60  70   Post FEV1/FCV % % 61  66   FEV1-Pre L 1.51  1.93   FEV1-Predicted Pre % 47  59   FEV1-Post L 1.65  1.89   DLCO uncorrected ml/min/mmHg 9.97  13.26   DLCO UNC% % 35  35   DLVA Predicted % 62  62   TLC L 6.26  5.41   TLC % Predicted % 77  68   RV % Predicted % 115  93     No results found for: "NITRICOXIDE"      Assessment & Plan:   CAP (community acquired pneumonia) RLL infiltrate with productive cough and hemoptysis. He has completed empiric course of doxycycline with clinical improvement. No further episodes of hemoptysis. Plan to repeat imaging in 6 weeks to ensure resolution. Advised him to continue with bronchodilator and mucociliary clearance therapies.   Patient Instructions  Continue Albuterol inhaler 2 puffs every 6 hours as needed for shortness of breath or wheezing. Notify if symptoms persist despite rescue inhaler/neb use.  Continue Stiolto 2 puffs Twice daily  Continue supplemental oxygen 2 lpm for goal oxygen >88-90%  Monitor your blood pressure at home for goal >100/60. If unable to stay above this, may need to consider adjusting your midodrine or fluid pills. Follow up with your heart doctor to discuss  Follow up in 6 weeks with [name] or Alanson Aly and repeat chest x ray. If symptoms do not improve or worsen, please contact office for sooner follow up or seek emergency care.    COPD with emphysema ([place]) Severe COPD/emphysema with recent AECOPD secondary to CAP. He is clinically improved  today. Maintained on LAMA/LABA therapy. May consider step up to triple therapy regimen upon his return as he does have peripheral eosinophilia on prior CBC. Action plan in place. See above.  Chronic respiratory failure with hypoxia (HCC) - on home O2 @ 2 L/min Stable on 2 lpm without increased requirement. Goal >88-90%  Pulmonary hypertension (HCC) Euvolemic on exam today. Prior BNP nl. Follow up with advance HF clinic as scheduled.   Hypotension 90/60 upon initial check today. Improved slightly to 98/60 on recheck. Asymptomatic. Advised him to monitor at home. Infectious symptoms have resolved and he is clinically improved. Follow up with cardiology if BP remains <100/60 to adjust medications.    I spent 32 minutes of dedicated to the care of this patient on the date of this encounter to include pre-visit review of records, face-to-face time with the patient discussing conditions above, post visit ordering of testing, clinical documentation with the electronic health record, making appropriate referrals as documented, and communicating necessary findings to members of the patients care team.  Clayton Bibles, NP 10/08/2022  Pt aware and understands NP's role.

## 2022-10-08 NOTE — Patient Instructions (Addendum)
Continue Albuterol inhaler 2 puffs every 6 hours as needed for shortness of breath or wheezing. Notify if symptoms persist despite rescue inhaler/neb use.  Continue Stiolto 2 puffs Twice daily  Continue supplemental oxygen 2 lpm for goal oxygen >88-90%  Monitor your blood pressure at home for goal >100/60. If unable to stay above this, may need to consider adjusting your midodrine or fluid pills. Follow up with your heart doctor to discuss  Follow up in 6 weeks with [name] or Alanson Aly and repeat chest x ray. If symptoms do not improve or worsen, please contact office for sooner follow up or seek emergency care.

## 2022-10-08 NOTE — Assessment & Plan Note (Signed)
Euvolemic on exam today. Prior BNP nl. Follow up with advance HF clinic as scheduled.

## 2022-10-08 NOTE — Assessment & Plan Note (Signed)
90/60 upon initial check today. Improved slightly to 98/60 on recheck. Asymptomatic. Advised him to monitor at home. Infectious symptoms have resolved and he is clinically improved. Follow up with cardiology if BP remains <100/60 to adjust medications.

## 2022-10-08 NOTE — Assessment & Plan Note (Signed)
Stable on 2 lpm without increased requirement. Goal >88-90%

## 2022-10-08 NOTE — Assessment & Plan Note (Signed)
Severe COPD/emphysema with recent AECOPD secondary to CAP. He is clinically improved today. Maintained on LAMA/LABA therapy. May consider step up to triple therapy regimen upon his return as he does have peripheral eosinophilia on prior CBC. Action plan in place. See above.

## 2022-10-21 DIAGNOSIS — H524 Presbyopia: Secondary | ICD-10-CM | POA: Diagnosis not present

## 2022-10-21 DIAGNOSIS — Z961 Presence of intraocular lens: Secondary | ICD-10-CM | POA: Diagnosis not present

## 2022-10-28 ENCOUNTER — Telehealth (HOSPITAL_COMMUNITY): Payer: Self-pay

## 2022-10-28 NOTE — Telephone Encounter (Signed)
Patient called and could not remember if he is supposed to be taking Potassium. I do not see on his medication list and do not see anything in recent labs or notes, but wanted to follow up with you to make sure he is not supposed to be on it.  Please advise.

## 2022-10-28 NOTE — Telephone Encounter (Signed)
I spoke to patient and updated. Appointment scheduled

## 2022-11-13 ENCOUNTER — Ambulatory Visit (INDEPENDENT_AMBULATORY_CARE_PROVIDER_SITE_OTHER): Payer: Medicare Other | Admitting: Podiatrist

## 2022-11-13 DIAGNOSIS — L84 Corns and callosities: Secondary | ICD-10-CM

## 2022-11-13 DIAGNOSIS — M79609 Pain in unspecified limb: Secondary | ICD-10-CM | POA: Diagnosis not present

## 2022-11-13 DIAGNOSIS — B351 Tinea unguium: Secondary | ICD-10-CM

## 2022-11-13 DIAGNOSIS — D689 Coagulation defect, unspecified: Secondary | ICD-10-CM | POA: Diagnosis not present

## 2022-11-13 NOTE — Progress Notes (Signed)
  Subjective:  Patient ID: Joshua [name], male    DOB: 17-Aug-1936,  MRN: [id number]  KYLO OMELIA presents to clinic today for at risk foot care with h/o coagulation defect and painful thick toenails that are difficult to trim. Pain interferes with ambulation. Aggravating factors include wearing enclosed shoe gear. Pain is relieved with periodic professional debridement.   Patient has h/o COPD and is on supplemental O2. Chief Complaint  Patient presents with   Nail Problem    [facility] PCP-Dana Collins PCP VST-Early 2024   New problem(s): None.   PCP is [name], DO.  Allergies  Allergen Reactions   Other Swelling    Beer- Swelling    Sunflower Oil Swelling   Sulfa Antibiotics Rash   Review of Systems: Negative except as noted in the HPI.  Objective: No changes noted in today's physical examination. There were no vitals filed for this visit.  Joshua [name] is a pleasant 87 y.o. male WD, WN in NAD. AAO x 3.  Vascular Examination: Capillary refill time <3 seconds b/l. Vascular status intact b/l with palpable pedal pulses. Pedal hair present b/l. No edema. No pain with calf compression b/l. Skin temperature gradient WNL b/l.   Neurological Examination: Sensation grossly intact b/l with 10 gram monofilament. Vibratory sensation intact b/l.   Dermatological Examination: Pedal skin with normal turgor, texture and tone b/l. Toenails 1-5 b/l thick, discolored, elongated with subungual debris and pain on dorsal palpation. Hyperkeratotic lesions submet 5 bilateral.  Heel lesion is improved.   Musculoskeletal Examination: Muscle strength 5/5 to all lower extremity muscle groups bilaterally. No pain, crepitus or joint limitation noted with ROM bilateral LE. Hammertoe(s) noted to the bilateral 5th toes.    Assessment/Plan:    ICD-10-CM   1. Pain due to onychomycosis of nail  B35.1    M79.609     2. Coagulation disorder ([place])  D68.9     3. Corns and callosities  L84         Debridement of toenails was recommended.  Onychoreduction of symptomatic toenails was performed via nail nipper and power burr without iatrogenic incident.  Debridement of hyperkeratotic lesions x 2 accomplished today with a 15 blade without incident. Patient was instructed on signs and symptoms of infection and was told to call immediately should any of these arise.  Recommended follow up in 3 months or instructed to call sooner if any pedal concerns arise.   No follow-ups on file.  Bronson Ing, DPM

## 2022-11-19 ENCOUNTER — Encounter (HOSPITAL_BASED_OUTPATIENT_CLINIC_OR_DEPARTMENT_OTHER): Payer: Self-pay | Admitting: Pulmonary Disease

## 2022-11-19 ENCOUNTER — Ambulatory Visit (INDEPENDENT_AMBULATORY_CARE_PROVIDER_SITE_OTHER): Payer: Medicare Other

## 2022-11-19 ENCOUNTER — Ambulatory Visit (INDEPENDENT_AMBULATORY_CARE_PROVIDER_SITE_OTHER): Payer: Medicare Other | Admitting: Pulmonary Disease

## 2022-11-19 VITALS — BP 112/60 | HR 80 | Temp 98.1°F | Ht 75.0 in | Wt 182.2 lb

## 2022-11-19 DIAGNOSIS — J432 Centrilobular emphysema: Secondary | ICD-10-CM

## 2022-11-19 DIAGNOSIS — R0602 Shortness of breath: Secondary | ICD-10-CM | POA: Diagnosis not present

## 2022-11-19 DIAGNOSIS — J9611 Chronic respiratory failure with hypoxia: Secondary | ICD-10-CM | POA: Diagnosis not present

## 2022-11-19 DIAGNOSIS — J811 Chronic pulmonary edema: Secondary | ICD-10-CM | POA: Diagnosis not present

## 2022-11-19 DIAGNOSIS — J189 Pneumonia, unspecified organism: Secondary | ICD-10-CM

## 2022-11-19 DIAGNOSIS — R053 Chronic cough: Secondary | ICD-10-CM | POA: Diagnosis not present

## 2022-11-19 MED ORDER — BREZTRI AEROSPHERE 160-9-4.8 MCG/ACT IN AERO: 2.0000 | INHALATION_SPRAY | Freq: Two times a day (BID) | RESPIRATORY_TRACT | 0 refills | Status: DC

## 2022-11-19 NOTE — Patient Instructions (Signed)
x ambulatory saturation on POC 2  x chest x-ray today to follow-up pneumonia  x trial of Breztri -2 puffs twice daily - sample  Use instead of stiolto , call us for prescription of this works

## 2022-11-19 NOTE — Assessment & Plan Note (Signed)
He feels that Stiolto is not working as well as it did before.  Will give him sample of Breztri to trial and he will call back if this works any better

## 2022-11-19 NOTE — Assessment & Plan Note (Signed)
Follow-up chest x-ray today for resolution of right lower lobe infiltrate.  He has been treated with doxycycline

## 2022-11-19 NOTE — Assessment & Plan Note (Signed)
Hypoxia appears to be stable to somewhat improved.  He is able to maintain saturation on 2 L POC and walking

## 2022-11-19 NOTE — Progress Notes (Signed)
   Subjective:    Patient ID: Joshua [name], male    DOB: [birth year], 87 y.o.   MRN: [id number]  HPI  87 yo former smoker for FU of COPD, chronic PE & pulmonary hypertension ,being treated as CTEPH   PMh/o PE postop after a knee replacement in 2016. -He lost more than 50 pounds,seen by GI, duodenal narrowing without obvious stricture   - hospitalized 03/2021 for CHF exacerbation>> started on Farxiga and midodrine He was again hospitalized 9/1 to 04/30/2021 with persistent nausea and vomiting and hypotension, requiring pressors.  CT abdomen/pelvis showed narrowing of duodenum but upper GI series did not show any stricture, there was an abdominal hernia with nonobstructed bowel -EGD 06/2021 showed Candida esophagitis, hiatal hernia  - "Nutcracker phenomenon:" abrupt narrowing third portion of duodenum as it passes between abdominal aorta and SMV.   Meds-  tried on Breo, anoro & trelegy  none of these gave any symptomatic relief -prefers Spiriva, stiolto Started on rociguat 01/2021  6-week follow-up visit for pneumonia. He presented 09/2022 with hemoptysis and was noted to have right lower lobe infiltrate.  He was treated with doxycycline chest x-ray 09/23/2022 showed right lower lobe infiltrate. Curiously CT chest/abdomen/pelvis 02/2022 had also showed right lower lobe consolidation  I reviewed cardiology consultation from 09/23/2022.  He was hospitalized for COVID infection in October He has gained 10 pounds since I saw him last in 12/2021.  He states that they never found a reason for his weight loss.  He complains of he Is on Gabapentin Which Makes Him Sleepy.  He Is Compliant with Oxygen 24/7 and Wishes He Could Get off Oxygen saturation was 96% on 2 L POC at rest and dropped to 92% on walking  Significant tests/ events reviewed     6 minute walk (10/22): 274 m (oxygen saturation dropped to 70s on 2L [place])   02/2021 Ambulatory saturation was 93% at rest and desaturated to 87% on walking,  recovered with 3 L of oxygen and maintain while ambulating   RHC (4/22) with mean RA 5, PA 65/19 mean 36, mean PCWP 5, CI 2.19, PVR 6.1 WU, PAPi 9.2.   Echo 12/2017 RVSP 39  NPSG 02/2018 >> no OSA, 45 mins desatn, TST 5.5 h   09/15/14 CT chest showed a large bilateral pulmonary embolism with RV dilatation. 08/2014 Venous Doppler showed no DVT    01/29/21- FEV1 51%, no BD response, ratio 61, TLC 77%, DLCO 35%  PFTs  11/2014 - FEV1 59%, no BD response, ratio 70, TLC 68%, DLCO 35%      Review of Systems neg for any significant sore throat, dysphagia, itching, sneezing, nasal congestion or excess/ purulent secretions, fever, chills, sweats, unintended wt loss, pleuritic or exertional cp, hempoptysis, orthopnea pnd or change in chronic leg swelling. Also denies presyncope, palpitations, heartburn, abdominal pain, nausea, vomiting, diarrhea or change in bowel or urinary habits, dysuria,hematuria, rash, arthralgias, visual complaints, headache, numbness weakness or ataxia.     Objective:   Physical Exam  Gen. Pleasant, elderly in no distress ENT - no lesions, no post nasal drip Neck: No JVD, no thyromegaly, no carotid bruits Lungs: no use of accessory muscles, no dullness to percussion, decreased without rales or rhonchi  Cardiovascular: Rhythm regular, heart sounds  normal, no murmurs or gallops, no peripheral edema Musculoskeletal: No deformities, no cyanosis or clubbing , no tremors       Assessment & Plan:

## 2022-12-02 ENCOUNTER — Ambulatory Visit (INDEPENDENT_AMBULATORY_CARE_PROVIDER_SITE_OTHER): Payer: Medicare Other | Admitting: Primary Care

## 2022-12-02 ENCOUNTER — Ambulatory Visit (INDEPENDENT_AMBULATORY_CARE_PROVIDER_SITE_OTHER)
Admission: RE | Admit: 2022-12-02 | Discharge: 2022-12-02 | Disposition: A | Discharge status: Home / Self Care | Payer: Medicare Other | Source: Ambulatory Visit | Attending: Primary Care | Admitting: Primary Care

## 2022-12-02 ENCOUNTER — Encounter: Payer: Self-pay | Admitting: Primary Care

## 2022-12-02 VITALS — BP 112/68 | HR 100 | Ht 75.0 in | Wt 184.0 lb

## 2022-12-02 DIAGNOSIS — J449 Chronic obstructive pulmonary disease, unspecified: Secondary | ICD-10-CM

## 2022-12-02 DIAGNOSIS — I5032 Chronic diastolic (congestive) heart failure: Secondary | ICD-10-CM

## 2022-12-02 DIAGNOSIS — J9611 Chronic respiratory failure with hypoxia: Secondary | ICD-10-CM

## 2022-12-02 DIAGNOSIS — R059 Cough, unspecified: Secondary | ICD-10-CM | POA: Diagnosis not present

## 2022-12-02 DIAGNOSIS — J432 Centrilobular emphysema: Secondary | ICD-10-CM | POA: Diagnosis not present

## 2022-12-02 NOTE — Assessment & Plan Note (Addendum)
-   Patient has +1-2 BLE, L>R - Continue Torsemide 20mg  daily as needed for sob/leg swelling

## 2022-12-02 NOTE — Progress Notes (Signed)
  ID: Joshua [name], male    DOB: 21-Aug-1935, 87 y.o.   MRN: [id number]  No chief complaint on file.   Referring provider: [name], DO  HPI: 87 year old male, former smoker. PMH significant for COPD, pulmonary HTN, PE after knee replacement in 206, Patient of Dr. Vassie Loll, last seen on 11/19/22.   Previous [facility] pulmonary encounter: 11/19/22- Dr. Vassie Loll  87 yo former smoker for FU of COPD, chronic PE & pulmonary hypertension PMh/o PE postop after a knee replacement in 2016. He was hospitalized 08/2017 for 3 days for acute hypoxic respiratory failure, attributed to diastolic heart failure.  He was diuresed 2.5 L, had elevated troponin, follow-up stress Myoview was low risk for ischemia.   Meds-  tried on Breo, anoro & trelegy  none of these gave any symptomatic relief -prefers Spiriva. Started on rociguat 01/2021 Last office visit 02/2021, we requalify him for oxygen, we retried him on Stiolto which he seemed to prefer  He was hospitalized 03/2021 for CHF exacerbation and started on Farxiga and midodrine He was again hospitalized 9/1 to 04/30/2021 with persistent nausea and vomiting and hypotension, requiring pressors.  He was seen by palliative care and limited CODE STATUS was issued, restarted on diuretics eventually.  CT abdomen/pelvis showed narrowing of duodenum but upper GI series did not show any stricture, there was an abdominal hernia with nonobstructed bowel I have reviewed both discharge summaries 27-month follow-up visit today, he arrives accompanied by his wife, ambulates with a cane, on oxygen. Complains of hiccups. Breathing remains poor, leg edema is stable, he takes torsemide 40 mg daily Denies dizziness or falls, compliant with oxygen Still complains of intermittent nausea  12/02/2022- Interim hx  Patient presents today for acute OV.  Patient follows with our office for COPD. He comes in today for cough which started 2 days ago. Cough is intermittently productive with  clear sputum. He took an antibiotic he had on hand that he was given for a previous pneumonia he had, he is unsure of the name. He has associated hiccups. Breathing is baseline. No increased shortness of breath. He has some chest discomfort when he cough. He has slight nasal congestion. He uses [employer] daily as prescribed. He wears 2L oxygen continuously. Weight is stable. He has some lower leg swelling. He is compliant with Eliquis and Torsemide.    Significant tests/ events reviewed Echo 12/2017 RVSP 39  NPSG 02/2018 >> no OSA, 45 mins desatn, TST 5.5 h   09/15/14 CT chest showed a large bilateral pulmonary embolism with RV dilatation. 08/2014 Venous Doppler showed no DVT  CT angiogram 08/2017 left upper lobe nodule 9x6 mm. CT 03/2018 which showed resolution of nodules   Pulmonary function testing:  11/2014 - FEV1 59%, no BD response, ratio 70, TLC 68%, DLCO 35%   01/29/21- FEV1 51%, no BD response, ratio 61, TLC 77%, DLCO 35%     Allergies  Allergen Reactions   Other Swelling    Beer- Swelling    Sunflower Oil Swelling   Sulfa Antibiotics Rash    Immunization History  Administered Date(s) Administered   DTaP 08/20/2007   Influenza, High Dose Seasonal PF 07/20/2014, 07/27/2015, 05/27/2017   Influenza, Quadrivalent, Recombinant, Inj, Pf 05/16/2021   Influenza,inj,Quad PF,6+ Mos 07/19/2014, 05/29/2015, 05/07/2018   Influenza-Unspecified 07/31/2011, 06/11/2012, 06/16/2013, 05/19/2016   Moderna Sars-Covid-2 Vaccination 09/01/2019, 09/29/2019, 06/11/2020, 11/24/2020, 12/04/2020   PNEUMOCOCCAL CONJUGATE-20 07/19/2022   Pneumococcal Polysaccharide-23 10/14/2005, 12/29/2008   Tdap 08/01/2018    Past  Medical History:  Diagnosis Date   Arthritis    BPH (benign prostatic hyperplasia)    Chronic hiccups    COPD (chronic obstructive pulmonary disease)    Folliculitis    posterior scalp per office visit note of Dr Selena Batten 07/20/2014     GERD (gastroesophageal reflux disease)    Gout     Hypertension    Hypotension    NSTEMI (non-ST elevated myocardial infarction) 03/19/2022   PE (pulmonary thromboembolism)    Phlebitis    right arm  at least 20 years ago    Pneumonia    hx of pneumonia as a child    Pulmonary embolism 03/17/2022   Pulmonary hypertension    Severe sepsis 03/16/2022   Sleep apnea     Tobacco History: Social History   Tobacco Use  Smoking Status Former   Packs/day: 1.00   Years: 50.00   Additional pack years: 0.00   Total pack years: 50.00   Types: Cigarettes   Start date: 54   Quit date: 08/19/2006   Years since quitting: 16.2  Smokeless Tobacco Never  Tobacco Comments   Former smoke 03/15/22   Counseling given: Not Answered Tobacco comments: Former smoke 03/15/22   Outpatient Medications Prior to Visit  Medication Sig Dispense Refill   albuterol (VENTOLIN HFA) 108 (90 Base) MCG/ACT inhaler Inhale 1 puff into the lungs every 4 (four) hours as needed for shortness of breath.     allopurinol (ZYLOPRIM) 100 MG tablet Take 100 mg by mouth every morning.     atorvastatin (LIPITOR) 40 MG tablet TAKE 1 TABLET BY MOUTH EVERY DAY 90 tablet 2   Cinnamon 500 MG capsule Take 500 mg by mouth every morning.     dapagliflozin propanediol (FARXIGA) 10 MG TABS tablet Take 1 tablet (10 mg total) by mouth daily before breakfast. 90 tablet 3   ELIQUIS 5 MG TABS tablet TAKE 1 TABLET BY MOUTH TWICE A DAY 60 tablet 11   finasteride (PROSCAR) 5 MG tablet Take 5 mg by mouth at bedtime.     gabapentin (NEURONTIN) 300 MG capsule Take 300 mg by mouth 3 (three) times daily.     LINZESS 72 MCG capsule Take 72 mcg by mouth every morning.     magnesium oxide (MAG-OX) 400 (240 Mg) MG tablet TAKE 1 TABLET BY MOUTH EVERY DAY 90 tablet 1   metoCLOPramide (REGLAN) 5 MG tablet Take 5 mg by mouth 3 (three) times daily before meals.     midodrine (PROAMATINE) 5 MG tablet Take 3 tablets (15 mg total) by mouth 3 (three) times daily. 810 tablet 3   Multiple Vitamin  (MULTIVITAMIN WITH MINERALS) TABS tablet Take 1 tablet by mouth daily. 30 tablet 0   OXYGEN Inhale 2 L into the lungs continuous.     pantoprazole (PROTONIX) 40 MG tablet Take 40 mg by mouth every morning.     Pediatric Multiple Vitamins (FLINTSTONES PLUS EXTRA C) CHEW Chew 1 tablet by mouth daily.     Riociguat 2 MG TABS Take 2 mg by mouth in the morning, at noon, and at bedtime. 90 tablet 11   STIOLTO RESPIMAT 2.5-2.5 MCG/ACT AERS INHALE 2 PUFFS BY MOUTH INTO THE LUNGS DAILY 4 g 5   torsemide (DEMADEX) 20 MG tablet Take 20 mg as needed for weight gain, swelling, or increased shortness of breath (Patient taking differently: Take 20 mg by mouth daily. Take 20 mg as needed for weight gain, swelling, or increased shortness of breath) 15 tablet 6  Budeson-Glycopyrrol-Formoterol (BREZTRI AEROSPHERE) 160-9-4.8 MCG/ACT AERO Inhale 2 puffs into the lungs in the morning and at bedtime. (Patient not taking: Reported on 12/02/2022) 10.7 g 0   doxycycline (VIBRA-TABS) 100 MG tablet Take 1 tablet (100 mg total) by mouth 2 (two) times daily. (Patient not taking: Reported on 12/02/2022) 20 tablet 0   No facility-administered medications prior to visit.   Review of Systems  Review of Systems  Constitutional: Negative.   HENT:  Positive for postnasal drip.   Respiratory:  Positive for cough. Negative for chest tightness, shortness of breath and wheezing.   Cardiovascular:  Positive for leg swelling. Negative for chest pain.   Physical Exam  BP 112/68 (BP Location: Left Arm, Patient Position: Sitting, Cuff Size: Normal)   Pulse 100   Ht  (1.905 m)   Wt 184 lb (83.5 kg)   SpO2 96%   BMI 23.00 kg/m  Physical Exam Constitutional:      General: He is not in acute distress.    Appearance: Normal appearance. He is not ill-appearing.  HENT:     Head: Normocephalic and atraumatic.     Mouth/Throat:     Mouth: Mucous membranes are moist.     Pharynx: Oropharynx is clear.  Cardiovascular:     Rate  and Rhythm: Normal rate and regular rhythm.     Comments: BLE swelling, L>R- wearing compression stockings Pulmonary:     Effort: Pulmonary effort is normal. No respiratory distress.     Breath sounds: No wheezing or rales.     Comments: Possible dull rhonchi (difficult to hear with hiccups) Musculoskeletal:        General: Normal range of motion.  Skin:    General: Skin is warm and dry.  Neurological:     General: No focal deficit present.     Mental Status: He is alert and oriented to person, place, and time. Mental status is at baseline.  Psychiatric:        Mood and Affect: Mood normal.        Behavior: Behavior normal.        Thought Content: Thought content normal.        Judgment: Judgment normal.      Lab Results:  CBC    Component Value Date/Time   WBC 6.6 09/23/2022 1119   RBC 4.29 09/23/2022 1119   HGB 11.4 (L) 09/23/2022 1119   HGB 15.4 12/07/2020 1100   HCT 36.0 (L) 09/23/2022 1119   HCT 48.7 12/07/2020 1100   PLT 202 09/23/2022 1119   PLT 191 12/07/2020 1100   MCV 83.9 09/23/2022 1119   MCV 88 12/07/2020 1100   MCH 26.6 09/23/2022 1119   MCHC 31.7 09/23/2022 1119   RDW 15.9 (H) 09/23/2022 1119   RDW 14.0 12/07/2020 1100   LYMPHSABS 0.6 (L) 07/19/2022 0241   MONOABS 0.7 07/19/2022 0241   EOSABS 0.5 07/19/2022 0241   BASOSABS 0.0 07/19/2022 0241    BMET    Component Value Date/Time   NA 137 09/23/2022 1119   NA 137 12/07/2020 1100   K 3.6 09/23/2022 1119   CL 91 (L) 09/23/2022 1119   CO2 35 (H) 09/23/2022 1119   GLUCOSE 92 09/23/2022 1119   BUN 28 (H) 09/23/2022 1119   BUN 31 (H) 12/07/2020 1100   CREATININE 1.64 (H) 09/23/2022 1119   CALCIUM 10.0 09/23/2022 1119   GFRNONAA 41 (L) 09/23/2022 1119   GFRAA >60 08/01/2018 1056    BNP    Component  Value Date/Time   BNP 90.1 09/23/2022 1119    ProBNP No results found for: "PROBNP"  Imaging: DG Chest 2 View  Result Date: 11/20/2022 CLINICAL DATA:  SOB chronic cough EXAM: CHEST - 2 VIEW  COMPARISON:  09/23/2022. FINDINGS: Lungs are hyperinflated suggesting COPD. Linear bibasilar subsegmental atelectasis or scarring. Pulmonary vascular congestion. Enlarged cardiac silhouette. No pneumothorax or pleural effusion. Aorta calcified. The visualized skeletal structures are unremarkable. IMPRESSION: Findings suggest COPD and scarring.  Enlarged cardiac silhouette. Electronically Signed   By: Layla Maw M.D.   On: 11/20/2022 15:23     Assessment & Plan:   COPD with emphysema (HCC) - Patient developed active cough 2 days ago. Cough is intermittently productive with clear sputum.  No increased shortness of breath symptoms or increased oxygen needs. We will get CXR today to ensure he does not have pneumonia due to his history.  Will hold off any antibiotics until chest x-ray results have come back.  No indication for steroids at this time.  Continue Stiolto Respimat 2 puffs daily and as needed albuterol.  Advised patient start taking Mucinex 600 mg twice daily and use flutter valve 3 times daily.  Notify office if symptoms do not improve or worsen.  Chronic respiratory failure with hypoxia (HCC) - on home O2 @ 2 L/min - Continue 2 L oxygen continuous; 3 L while using pulsed oxygen to maintain O2 >88-90%    Chronic diastolic CHF (congestive heart failure) (HCC) - Patient has +1-2 BLE, L>R - Continue Torsemide 20mg  daily as needed for sob/leg swelling     Glenford Bayley, NP 12/02/2022

## 2022-12-02 NOTE — Assessment & Plan Note (Signed)
-   Patient developed active cough 2 days ago. Cough is intermittently productive with clear sputum.  No increased shortness of breath symptoms or increased oxygen needs. We will get CXR today to ensure he does not have pneumonia due to his history.  Will hold off any antibiotics until chest x-ray results have come back.  No indication for steroids at this time.  Continue Stiolto Respimat 2 puffs daily and as needed albuterol.  Advised patient start taking Mucinex 600 mg twice daily and use flutter valve 3 times daily.  Notify office if symptoms do not improve or worsen.

## 2022-12-02 NOTE — Patient Instructions (Addendum)
Recommendations: - Continue Stiolto Respimat 2 puffs daily every morning - Use albuterol 2 puffs every 4-6 hours as needed for breakthrough shortness of breath or wheezing - Start Mucinex 600 mg tablet morning and evening with glass of water - Flutter valve 3 times daily - We will consider antibiotics if CXR shows bronchitis/pneumonia   Orders: - CXR today ([address]- basement)   Follow-up: - Scheduled regular visit with Dr. Vassie Loll in 1-2 months  - Call if symptoms do not improvement

## 2022-12-02 NOTE — Assessment & Plan Note (Addendum)
-   Continue 2 L oxygen continuous; 3 L while using pulsed oxygen to maintain O2 >88-90%

## 2022-12-03 NOTE — Progress Notes (Signed)
Please let patient know CXR showed mild atelectasis or scarring. No pneumonia. No change to plan.

## 2022-12-09 ENCOUNTER — Telehealth: Payer: Self-pay | Admitting: Primary Care

## 2022-12-09 MED ORDER — PREDNISONE 10 MG PO TABS: ORAL_TABLET | ORAL | 0 refills | Status: AC

## 2022-12-09 MED ORDER — PROMETHAZINE-DM 6.25-15 MG/5ML PO SYRP: 5.0000 mL | ORAL_SOLUTION | Freq: Four times a day (QID) | ORAL | 0 refills | Status: DC | PRN

## 2022-12-09 NOTE — Telephone Encounter (Signed)
Called and spoke with patient, advised of recommendations per Ames Dura NP.  He verbalized understanding.  Verified pharmacy and scripts sent.  Nothing further needed.

## 2022-12-09 NOTE — Telephone Encounter (Signed)
Pt called stating that he is still having problems with his cough. Pt said he is coughing up clear phlegm occasionally. States that he does have some wheezing associated with the cough.  Pt said the cough is worse at night as the cough does wake him up at night when he is trying to sleep.   Pt said he has been taking the mucinex that was given to him by [facility] but said that this is not helping with his cough.  He wants to know if there is anything else that could be sent in for him to help with his cough. Beth, please advise.

## 2022-12-09 NOTE — Telephone Encounter (Signed)
Please send in prednisone  x 2 days;  x 2 days;  x 2 days. Try promethazine-dm q 6 hours for cough.

## 2022-12-18 ENCOUNTER — Ambulatory Visit (HOSPITAL_COMMUNITY)
Admission: RE | Admit: 2022-12-18 | Discharge: 2022-12-18 | Disposition: A | Discharge status: Home / Self Care | Payer: Medicare Other | Source: Ambulatory Visit | Attending: Cardiology | Admitting: Cardiology

## 2022-12-18 ENCOUNTER — Encounter (HOSPITAL_COMMUNITY): Payer: Self-pay | Admitting: Cardiology

## 2022-12-18 VITALS — BP 106/74 | HR 86 | Wt 178.0 lb

## 2022-12-18 DIAGNOSIS — I272 Pulmonary hypertension, unspecified: Secondary | ICD-10-CM | POA: Insufficient documentation

## 2022-12-18 DIAGNOSIS — K2100 Gastro-esophageal reflux disease with esophagitis, without bleeding: Secondary | ICD-10-CM | POA: Diagnosis not present

## 2022-12-18 DIAGNOSIS — N183 Chronic kidney disease, stage 3 unspecified: Secondary | ICD-10-CM | POA: Insufficient documentation

## 2022-12-18 DIAGNOSIS — I130 Hypertensive heart and chronic kidney disease with heart failure and stage 1 through stage 4 chronic kidney disease, or unspecified chronic kidney disease: Secondary | ICD-10-CM | POA: Diagnosis not present

## 2022-12-18 DIAGNOSIS — J9610 Chronic respiratory failure, unspecified whether with hypoxia or hypercapnia: Secondary | ICD-10-CM | POA: Diagnosis not present

## 2022-12-18 DIAGNOSIS — I5032 Chronic diastolic (congestive) heart failure: Secondary | ICD-10-CM | POA: Diagnosis not present

## 2022-12-18 DIAGNOSIS — I2510 Atherosclerotic heart disease of native coronary artery without angina pectoris: Secondary | ICD-10-CM | POA: Insufficient documentation

## 2022-12-18 DIAGNOSIS — J439 Emphysema, unspecified: Secondary | ICD-10-CM | POA: Insufficient documentation

## 2022-12-18 DIAGNOSIS — G4733 Obstructive sleep apnea (adult) (pediatric): Secondary | ICD-10-CM | POA: Insufficient documentation

## 2022-12-18 DIAGNOSIS — Z79899 Other long term (current) drug therapy: Secondary | ICD-10-CM | POA: Diagnosis not present

## 2022-12-18 DIAGNOSIS — I2782 Chronic pulmonary embolism: Secondary | ICD-10-CM | POA: Diagnosis not present

## 2022-12-18 DIAGNOSIS — Z7901 Long term (current) use of anticoagulants: Secondary | ICD-10-CM | POA: Insufficient documentation

## 2022-12-18 LAB — BASIC METABOLIC PANEL
Anion gap: 8 (ref 5–15)
BUN: 26 mg/dL — ABNORMAL HIGH (ref 8–23)
CO2: 37 mmol/L — ABNORMAL HIGH (ref 22–32)
Calcium: 9.8 mg/dL (ref 8.9–10.3)
Chloride: 94 mmol/L — ABNORMAL LOW (ref 98–111)
Creatinine, Ser: 1.66 mg/dL — ABNORMAL HIGH (ref 0.61–1.24)
GFR, Estimated: 40 mL/min — ABNORMAL LOW (ref >60)
Glucose, Bld: 95 mg/dL (ref 70–99)
Potassium: 3.7 mmol/L (ref 3.5–5.1)
Sodium: 139 mmol/L (ref 135–145)

## 2022-12-18 LAB — BRAIN NATRIURETIC PEPTIDE: B Natriuretic Peptide: 115.2 pg/mL — ABNORMAL HIGH (ref 0.0–100.0)

## 2022-12-18 NOTE — Patient Instructions (Signed)
There has been no changes to your medications  Labs done today, your results will be available in MyChart, we will contact you for abnormal readings.  You have been referred Dr. Mayford Knife. Her office will call you to arrange your appointment.  Your physician recommends that you schedule a follow-up appointment in: 3 months  If you have any questions or concerns before your next appointment please send [place] a message through [place] or call our office at [phone].    TO LEAVE A MESSAGE FOR THE NURSE SELECT OPTION 2, PLEASE LEAVE A MESSAGE INCLUDING: YOUR NAME DATE OF BIRTH CALL BACK NUMBER REASON FOR CALL**this is important as we prioritize the call backs  YOU WILL RECEIVE A CALL BACK THE SAME DAY AS LONG AS YOU CALL BEFORE 4:00 PM  At the [facility], you and your health needs are our priority. As part of our continuing mission to provide you with exceptional heart care, we have created designated Provider Care Teams. These Care Teams include your primary Cardiologist (physician) and Advanced Practice Providers (APPs- Physician Assistants and Nurse Practitioners) who all work together to provide you with the care you need, when you need it.   You may see any of the following providers on your designated Care Team at your next follow up: Dr Arvilla Meres Dr Marca Ancona Dr. Marcos Eke, NP Robbie Lis, [place] [facility] [place], [place] Brynda Peon, NP Karle Plumber, PharmD   Please be sure to bring in all your medications bottles to every appointment.    Thank you for choosing [facility]

## 2022-12-19 NOTE — Progress Notes (Signed)
PCP: [name], DO Cardiology: Dr. Excell Seltzer HF Cardiology: Dr. Shirlee Latch  87 y.o. with history of HFPEF, prior PE, and pulmonary hypertension was referred by Dr. Izora Ribas for evaluation of pulmonary hypertension.  Patient had a PE in 2016.  He is on apixaban. He has a long history of diastolic CHF.  Most recent echo showed a significant component of RV failure with EF 55-60%, IV septum flattened, severe RV enlargement, severely decreased RV function, PASP 57 mmHg.  CTA chest in 4/22 showed no acute PE and mild emphysema, but V/Q scan in 5/22 was suggestive of chronic PE in the right middle lobe.  RHC in 4/22 showed normal filling pressures with moderate PAH. He additionally has chronic hiccups followed by Dr. Elnoria Howard, now on gabapentin. PFTs in 6/22 showed mixed picture with severe obstruction, moderate restriction, and severely decreased DLCO.   Admitted 8/25-8/30/22 with A/C HF exacerbation. He was aggressively diuresed with lasix/metolazone, had unna boots, farxiga added.  Midodrine was increased for hypotension. Hospitalization c/b AKI on CKD III.  Readmitted 9/1-9/12/22 with shock (mixed septic and cardiogenic), likely 2/2 to aspiration pneumonia and A/C CHF.  Suspect possible gut translocation with ileus/partial SBO; CT abdomen with "nutcracker phenomenon".  He was started on NE + antibiotics. General surgery consulted and felt not to be a surgical candidate. Diuretics initially held due to over-diuresis and shock. Eventually able to wean pressors off, restart PO torsemide and midodrine. Hospitalization c/b transaminitis, delirium, and BPH with urinary retention, requiring foley catheter. Palliative care was consulted for GOC and patient DNR/DNI. PT/OT recommended HH. He was discharged home, weight 220 lbs.  EGD 11/22 showed esophagitis, hiatal hernia and yeast.   Acute visit 07/17/21 for weakness/falls. He had a syncopal episode in clinic. Labs showed hypokalemia of 3.3, SCr elevated at 2.13,  ReDs 27%. Marcelline Deist stopped, torsemide decreased to 40 mg daily and compression hose and abdominal binder ordered. 3-day Zio placed to evaluate for possible arrhythmogenic cause of symptoms.  Zio monitor showed 19 second run of VT.   Follow up 1/23, rare atypical chest pain. NYHA III, weight continued to trend down.  Admitted 2/23 with PNA and a/c CHF. Seen by palliative care and remained full code.  Admitted 8/23 with septic shock due to aspiration PNA & possible colitis. Echo showed EF 60-65%, RV severely reduced, severely enlarged, RVSP 50, LVEF 60-65%, no RWMA, mild TR, trivial MR. AHF consulted for elevated Hs trop. He underwent R/LHC showing nonobstructive mild CAD, normal/low filling pressures and mild PAH. Troponin elevation likely due to demand ischemia. Hospitalization c/b AKI on CKD. He was restarted on Farxiga and lower dose torsemide (10 mg daily). He was discharged home, weight 175 lbs.   He was admitted 11/23 with a/c respiratory failure, recently discharged (10/23)  after COVID-19 infection. Felt to be septic 2/2 PNA vs UTI. Received IV abx. HsTrop elevated on admission but felt to be demand ischemia. Stable from a HF standpoint and continued on torsemide, midodrine and Farxiga. He was discharged home with HH, weight 188 lbs.  Today he returns for HF follow up. He continues to wear 2L home oxygen.  Weight down 3 lbs.  He is not short of breath walking on flat ground.  No dyspnea walking around in stores.  Drives himself.  No orthopnea/PND.  No chest pain.  No lightheadedness or falls.   ECG (personally reviewed): NSR, RBBB  Labs (4/22): K 4.8, creatinine 1.67 Labs (6/22): K 3.8, creatinine 1.72, ANA negative, RF 39.6 but CCP negative, SCL-70 negative,  BNP 1231, HIV negative Labs (7/22): K 3.8, creatine 1.67 Labs (8/22): K 3.6, creatinine 1.6, hgb 15.3  Labs (9/22): K 5.0, creatinine 1.2, hgb 14.6 Labs (10/22): K 3.4, creatinine 1.37 Labs (10/22): K 3.5, creatinine 1.34 Labs  (12/22): K 4.5, creatinine 1.81 Labs (2/23): K 3.8, creatinine 1.20 Labs (5/23): K 4.3, creatinine 1.5, BNP 84 Labs (8/23): K 4.5, creatinine 1.00 Labs (10/23): K 3.9, creatinine 1.49, AST 43, ALT normal, BNP 1164 Labs (12/23): K 4.1, creatinine 1.17 Labs (2/24): K 3.6, creatinine 1.64, hgb 11.4, BNP 90  6 minute walk (7/22): 213 m 6 minute walk (10/22): 274 m (oxygen saturation dropped to 70s on 2L [place])  PMH: 1. HFPEF: With prominent RV failure.   - Echo (2/22): EF 55-60%, IV septum flattened, severe RV enlargement, severely decreased RV function, PASP 57 mmHg.  - Echo (8/23): EF 60-65%, RV severely reduced, severely enlarged, RVSP 50, LVEF 60-65%, no RWMA, mild TR, trivial MR.  2. Venous thromboembolic disease: PE in 2016.   - Venous dopplers (4/22): No DVT.  - CTA chest (4/22): No PE.  - V/Q scan 5/22 with perfusion defect in the RML consistent with chronic PE.  3. OSA: Did not tolerate CPAP.   4. HTN 5. COPD: Prior smoker.   - CTA chest in 4/22 showed no PE, mild emphysema.  - PFTs (6/22) with severe obstruction, moderate restriction, severely decreased DLCO 6. Pulmonary hypertension: RHC (4/22) with mean RA 5, PA 65/19 mean 36, mean PCWP 5, CI 2.19, PVR 6.1 WU, PAPi 9.2.  - RHC (8/23) with mean RA 1, PA 46/10 mean 25, mean PCWP 4, CI 2.98, PVR 3.4 WU 7. Chronic hiccups 8. BPH: Has to in and out cath at times. 9. Syncope: Zio 12/22 with 19 second VT run.  10. Chronic hiccups 11. 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 12. CAD: LHC (8/23) with nonobstructive CAD.   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 Stopped working in  2022  Tobacco Use   Smoking status:  Former    Packs/day: 1.00    Years: 50.00    Additional pack years: 0.00    Total pack years: 50.00    Types: Cigarettes    Start date: 83    Quit date: 08/19/2006    Years since quitting: 16.3   Smokeless tobacco: Never   Tobacco comments:    Former smoke 03/15/22  Vaping Use   Vaping Use: Never used  Substance and Sexual Activity   Alcohol use: Not Currently    Alcohol/week: 6.0 standard drinks of alcohol    Types: 6 Shots of liquor per week   Drug use: No   Sexual activity: Not on file  Other Topics Concern   Not on file  Social History Narrative   Not on file   Social Determinants of Health   Financial Resource Strain: Not on file  Food Insecurity: Patient Declined (07/18/2022)   Hunger Vital Sign    Worried About Running Out of Food in the Last Year: Patient declined    Ran Out of Food in the Last Year: Patient declined  Transportation Needs: No Transportation Needs (07/18/2022)   PRAPARE - Transportation  Lack of Transportation (Medical): No    Lack of Transportation (Non-Medical): No  Physical Activity: Not on file  Stress: No Stress Concern Present (05/09/2021)   [employer] of Occupational Health - Occupational Stress Questionnaire    Feeling of Stress : Only a little  Social Connections: Not on file  Intimate Partner Violence: Not At Risk (07/18/2022)   Humiliation, Afraid, Rape, and Kick questionnaire    Fear of Current or Ex-Partner: No    Emotionally Abused: No    Physically Abused: No    Sexually Abused: No   Family History  Problem Relation Age of Onset   Heart attack Brother 57   ROS: All systems reviewed and negative except as per HPI.   Current Outpatient Medications  Medication Sig Dispense Refill   albuterol (VENTOLIN HFA) 108 (90 Base) MCG/ACT inhaler Inhale 1 puff into the lungs every 4 (four) hours as needed for shortness of breath.     allopurinol (ZYLOPRIM) 100 MG tablet Take 100 mg by mouth every morning.     atorvastatin  (LIPITOR) 40 MG tablet TAKE 1 TABLET BY MOUTH EVERY DAY 90 tablet 2   Cinnamon 500 MG capsule Take 500 mg by mouth every morning.     dapagliflozin propanediol (FARXIGA) 10 MG TABS tablet Take 1 tablet (10 mg total) by mouth daily before breakfast. 90 tablet 3   ELIQUIS 5 MG TABS tablet TAKE 1 TABLET BY MOUTH TWICE A DAY 60 tablet 11   finasteride (PROSCAR) 5 MG tablet Take 5 mg by mouth at bedtime.     gabapentin (NEURONTIN) 300 MG capsule Take 300 mg by mouth 3 (three) times daily.     magnesium oxide (MAG-OX) 400 (240 Mg) MG tablet TAKE 1 TABLET BY MOUTH EVERY DAY 90 tablet 1   metoCLOPramide (REGLAN) 5 MG tablet Take 5 mg by mouth 3 (three) times daily before meals.     midodrine (PROAMATINE) 5 MG tablet Take 3 tablets (15 mg total) by mouth 3 (three) times daily. 810 tablet 3   Multiple Vitamin (MULTIVITAMIN WITH MINERALS) TABS tablet Take 1 tablet by mouth daily. 30 tablet 0   OXYGEN Inhale 2 L into the lungs continuous.     pantoprazole (PROTONIX) 40 MG tablet Take 40 mg by mouth every morning.     Riociguat 2 MG TABS Take 2 mg by mouth in the morning, at noon, and at bedtime. 90 tablet 11   STIOLTO RESPIMAT 2.5-2.5 MCG/ACT AERS INHALE 2 PUFFS BY MOUTH INTO THE LUNGS DAILY 4 g 5   torsemide (DEMADEX) 20 MG tablet Take 20 mg as needed for weight gain, swelling, or increased shortness of breath (Patient taking differently: Take 20 mg by mouth daily. Take 20 mg as needed for weight gain, swelling, or increased shortness of breath) 15 tablet 6   Budeson-Glycopyrrol-Formoterol (BREZTRI AEROSPHERE) 160-9-4.8 MCG/ACT AERO Inhale 2 puffs into the lungs in the morning and at bedtime. (Patient not taking: Reported on 12/02/2022) 10.7 g 0   LINZESS 72 MCG capsule Take 72 mcg by mouth every morning. (Patient not taking: Reported on 12/18/2022)     promethazine-dextromethorphan (PROMETHAZINE-DM) 6.25-15 MG/5ML syrup Take 5 mLs by mouth every 6 (six) hours as needed for cough (for cough). (Patient not taking:  Reported on 12/18/2022) 118 mL 0   No current facility-administered medications for this encounter.   Wt Readings from Last 3 Encounters:  12/18/22 80.7 kg (178 lb)  12/02/22 83.5 kg (184 lb)  11/19/22 82.6 kg (182 lb 3.2  oz)   BP 106/74   Pulse 86   Wt 80.7 kg (178 lb)   SpO2 93% Comment: on 3 liters  BMI 22.25 kg/m  General: NAD Neck: Dilated EJs, no thyromegaly or thyroid nodule.  Lungs: Distant breath sounds CV: Nondisplaced PMI.  Heart regular S1/S2, no S3/S4, no murmur.  1+ left ankle edema.  No carotid bruit.  Normal pedal pulses.  Abdomen: Soft, nontender, no hepatosplenomegaly, no distention.  Skin: Intact without lesions or rashes.  Neurologic: Alert and oriented x 3.  Psych: Normal affect. Extremities: No clubbing or cyanosis.  HEENT: Normal.   Assessment/Plan: 1. Chronic HFpEF/RV failure: Echo (2/22) with EF 55-60%, IV septum flattened, severe RV enlargement, severely decreased RV function, PASP 57 mmHg.  He has severe RV failure at baseline.  Echo this admit 8/23 >>RV severely reduced, severely enlarged, RVSP 50, LVEF 60-65%, no RWMA, mild TR, trivial MR.  RHC 8/23 showed normal/low filling pressures and mild PAH. NYHA class II as long as he is wearing his oxygen. He is not volume overloaded on exam. Midodrine seems to be controlling his orthostasis.  - Continue torsemide 20 mg daily. BMET and BNP today. - Continue midodrine 15 mg tid. Would avoid fludrocortisone. - Continue dapagliflozin 10 mg daily.   - I will arrange for repeat echo.  2. Pulmonary HTN: PAH noted on 4/22 RHC with PVR 6.1 WU.  This appears to be multifactorial with OSA, severe emphysema, and a suspected chronic PE involving the right middle lobe (group 3 and group 4 PH). Given the suspected mixed etiology with only 1 area of chronic thromboembolism (right middle lobe) as well as age, do not think that pulmonary thromboendarterectomy would be indicated.  Rheumatologic serologic workup was negative.  PFTs  showed severe obstruction and moderate restriction, suggesting significant COPD. RHC 8/23 showed mild pulmonary hypertension with PVR 3.4 WU.  - Continue riociguat.  - Holding off Tyvaso for now, suspect large group 3 PH component and only mildly elevated PA pressure on 8/23 RHC.  3. Chronic respiratory failure: Admission 10/23 for COVID. Recent admission for PNA.  - Stable on 2L [place] 4. CKD III: BMET today. 5. CAD: nonobstructive on [facility] 8/23. No chest pain - Continue statin.  6. OSA: Moderate OSA on sleep study. He did not tolerate CPAP.  Interested in [place] device.  - Will refer to Dr. Mayford Knife to see if he would be an Inspire candidate.  7. Emphysema: Prior smoker.  Emphysema on CT and severe obstruction on PFTs. On 2L home oxygen. 8. Chronic PE: Diagnosed by V/Q scan.  No abnormal bleeding. He has a probable chronic RLE DVT.   - Continue Eliquis. 9. Hiatial hernia w/ esophagitis: Has significant GERD. He follows with Dr. Elnoria Howard. - Continue PPI.  10. Nutcracker SMA: Compresses duodenum   Marca Ancona 12/19/22

## 2022-12-29 ENCOUNTER — Other Ambulatory Visit (HOSPITAL_COMMUNITY): Payer: Self-pay | Admitting: Cardiology

## 2023-01-08 DIAGNOSIS — I272 Pulmonary hypertension, unspecified: Secondary | ICD-10-CM | POA: Diagnosis not present

## 2023-01-08 DIAGNOSIS — N401 Enlarged prostate with lower urinary tract symptoms: Secondary | ICD-10-CM | POA: Diagnosis not present

## 2023-01-08 DIAGNOSIS — Z9981 Dependence on supplemental oxygen: Secondary | ICD-10-CM | POA: Diagnosis not present

## 2023-01-08 DIAGNOSIS — I5032 Chronic diastolic (congestive) heart failure: Secondary | ICD-10-CM | POA: Diagnosis not present

## 2023-01-08 DIAGNOSIS — R066 Hiccough: Secondary | ICD-10-CM | POA: Diagnosis not present

## 2023-01-08 DIAGNOSIS — K219 Gastro-esophageal reflux disease without esophagitis: Secondary | ICD-10-CM | POA: Diagnosis not present

## 2023-01-08 DIAGNOSIS — E118 Type 2 diabetes mellitus with unspecified complications: Secondary | ICD-10-CM | POA: Diagnosis not present

## 2023-01-08 DIAGNOSIS — I2510 Atherosclerotic heart disease of native coronary artery without angina pectoris: Secondary | ICD-10-CM | POA: Diagnosis not present

## 2023-01-08 DIAGNOSIS — J449 Chronic obstructive pulmonary disease, unspecified: Secondary | ICD-10-CM | POA: Diagnosis not present

## 2023-01-08 DIAGNOSIS — Z87898 Personal history of other specified conditions: Secondary | ICD-10-CM | POA: Diagnosis not present

## 2023-01-09 ENCOUNTER — Telehealth: Payer: Self-pay | Admitting: Pulmonary Disease

## 2023-01-09 ENCOUNTER — Ambulatory Visit (HOSPITAL_COMMUNITY)
Admission: RE | Admit: 2023-01-09 | Discharge: 2023-01-09 | Disposition: A | Discharge status: Home / Self Care | Payer: Medicare Other | Source: Ambulatory Visit | Attending: Cardiology | Admitting: Cardiology

## 2023-01-09 DIAGNOSIS — I5032 Chronic diastolic (congestive) heart failure: Secondary | ICD-10-CM | POA: Diagnosis not present

## 2023-01-09 LAB — ECHOCARDIOGRAM COMPLETE
AR max vel: 3.34 cm2
AV Area VTI: 3.13 cm2
AV Area mean vel: 3.23 cm2
AV Mean grad: 4.5 mmHg
AV Peak grad: 8.6 mmHg
Ao pk vel: 1.47 m/s
Area-P 1/2: 3.99 cm2
Calc EF: 71.4 %
P 1/2 time: 474 msec
S' Lateral: 2.9 cm
Single Plane A2C EF: 72.5 %
Single Plane A4C EF: 72.6 %

## 2023-01-09 NOTE — Telephone Encounter (Signed)
Patient would like new RX for [employer]. Pharmacy is [facility]. Patient phone number is [phone].

## 2023-01-10 MED ORDER — BREZTRI AEROSPHERE 160-9-4.8 MCG/ACT IN AERO: 2.0000 | INHALATION_SPRAY | Freq: Two times a day (BID) | RESPIRATORY_TRACT | 6 refills | Status: DC

## 2023-01-10 NOTE — Telephone Encounter (Signed)
Rx for Joshua [name] has been sent to pharmacy for pt. Called and spoke with pt letting him know this had been done and he verbalized understanding. Nothing further needed.

## 2023-01-12 ENCOUNTER — Other Ambulatory Visit: Payer: Self-pay | Admitting: Pulmonary Disease

## 2023-02-07 DIAGNOSIS — R339 Retention of urine, unspecified: Secondary | ICD-10-CM | POA: Diagnosis not present

## 2023-02-07 DIAGNOSIS — N401 Enlarged prostate with lower urinary tract symptoms: Secondary | ICD-10-CM | POA: Diagnosis not present

## 2023-02-10 ENCOUNTER — Encounter (HOSPITAL_BASED_OUTPATIENT_CLINIC_OR_DEPARTMENT_OTHER): Payer: Self-pay | Admitting: Pulmonary Disease

## 2023-02-10 ENCOUNTER — Ambulatory Visit (INDEPENDENT_AMBULATORY_CARE_PROVIDER_SITE_OTHER): Payer: Medicare Other | Admitting: Pulmonary Disease

## 2023-02-10 VITALS — BP 94/50 | HR 77 | Temp 98.0°F | Ht 75.0 in | Wt 183.4 lb

## 2023-02-10 DIAGNOSIS — I272 Pulmonary hypertension, unspecified: Secondary | ICD-10-CM

## 2023-02-10 DIAGNOSIS — J9611 Chronic respiratory failure with hypoxia: Secondary | ICD-10-CM | POA: Diagnosis not present

## 2023-02-10 DIAGNOSIS — J432 Centrilobular emphysema: Secondary | ICD-10-CM | POA: Diagnosis not present

## 2023-02-10 MED ORDER — STIOLTO RESPIMAT 2.5-2.5 MCG/ACT IN AERS: 2.0000 | INHALATION_SPRAY | Freq: Every day | RESPIRATORY_TRACT | 0 refills | Status: DC

## 2023-02-10 NOTE — Assessment & Plan Note (Signed)
Continue riociguat and Eliquis. On midodrine for chronic hypotension

## 2023-02-10 NOTE — Progress Notes (Signed)
   Subjective:    Patient ID: RASHEED WELTY, male    DOB: [birth year], 87 y.o.   MRN: [id number]  HPI  87 yo former smoker for FU of COPD, chronic PE & pulmonary hypertension ,being treated as CTEPH   PMH : PE postop after a knee replacement in 2016. -He lost more than 50 pounds,seen by GI, duodenal narrowing without obvious stricture -Hemoptysis 09/2022 with RLL Pneumonia   - hospitalized 03/2021 for CHF exacerbation>> started on Farxiga and midodrine He was again hospitalized 04/2021 with persistent nausea and vomiting and hypotension, requiring pressors.  CT abdomen/pelvis showed narrowing of duodenum but upper GI series did not show any stricture, there was an abdominal hernia with nonobstructed bowel -EGD 06/2021 showed Candida esophagitis, hiatal hernia  - "Nutcracker phenomenon:" abrupt narrowing third portion of duodenum as it passes between abdominal aorta and SMV.   Meds-  tried on Breo, anoro & trelegy  none of these gave any symptomatic relief -prefers Spiriva, stiolto Started on rociguat 01/2021  61-month follow-up visit Breathing is stable.  He is compliant with oxygen.  He takes torsemide daily and denies peripheral edema Last office visit we switched him from [employer] to [place].  He finds this very expensive and no better than Stiolto and would like to revert back we also checked his oxygen saturation with ambulation and this was maintained on 2 L POC.  He stays cold due to Eliquis His weight has been stable since last visit  Significant tests/ events reviewed  6 minute walk (10/22): 274 m (oxygen saturation dropped to 70s on 2L [place])   02/2021 Ambulatory saturation was 93% at rest and desaturated to 87% on walking, recovered with 3 L of oxygen and maintain while ambulating   RHC (4/22) with mean RA 5, PA 65/19 mean 36, mean PCWP 5, CI 2.19, PVR 6.1 WU, PAPi 9.2.   Echo 12/2017 RVSP 39  NPSG 02/2018 >> no OSA, 45 mins desatn, TST 5.5 h   09/15/14 CT chest showed a large  bilateral pulmonary embolism with RV dilatation. 08/2014 Venous Doppler showed no DVT    01/29/21- FEV1 51%, no BD response, ratio 61, TLC 77%, DLCO 35%  PFTs  11/2014 - FEV1 59%, no BD response, ratio 70, TLC 68%, DLCO 35%  Review of Systems neg for any significant sore throat, dysphagia, itching, sneezing, nasal congestion or excess/ purulent secretions, fever, chills, sweats, unintended wt loss, pleuritic or exertional cp, hempoptysis, orthopnea pnd or change in chronic leg swelling. Also denies presyncope, palpitations, heartburn, abdominal pain, nausea, vomiting, diarrhea or change in bowel or urinary habits, dysuria,hematuria, rash, arthralgias, visual complaints, headache, numbness weakness or ataxia.     Objective:   Physical Exam  Gen. Pleasant,elderly, well-nourished, in no distress ENT - no thrush, no pallor/icterus,no post nasal drip, on O2 POC Neck: No JVD, no thyromegaly, no carotid bruits Lungs: no use of accessory muscles, no dullness to percussion, clear without rales or rhonchi  Cardiovascular: Rhythm regular, heart sounds  normal, no murmurs or gallops, no peripheral edema Musculoskeletal: No deformities, no cyanosis or clubbing        Assessment & Plan:

## 2023-02-10 NOTE — Assessment & Plan Note (Signed)
Continue 2 L POC at all times

## 2023-02-10 NOTE — Assessment & Plan Note (Signed)
Switch back to Stiolto due to patient preference. Will discontinue Breztri from his medication list We discussed action plan for COPD and signs and symptoms of COPD exacerbation

## 2023-02-10 NOTE — Patient Instructions (Signed)
X Rx for Stiolto -2 puffs once daily instead of [employer]

## 2023-02-12 DIAGNOSIS — R066 Hiccough: Secondary | ICD-10-CM | POA: Diagnosis not present

## 2023-02-12 DIAGNOSIS — R634 Abnormal weight loss: Secondary | ICD-10-CM | POA: Diagnosis not present

## 2023-02-12 DIAGNOSIS — K219 Gastro-esophageal reflux disease without esophagitis: Secondary | ICD-10-CM | POA: Diagnosis not present

## 2023-02-18 ENCOUNTER — Telehealth (HOSPITAL_COMMUNITY): Payer: Self-pay | Admitting: Pharmacy Technician

## 2023-02-18 ENCOUNTER — Other Ambulatory Visit (HOSPITAL_COMMUNITY): Payer: Self-pay

## 2023-02-18 NOTE — Telephone Encounter (Signed)
Advanced Heart Failure Patient Advocate Encounter  Prior Authorization for Adempas has been approved.    PA# [id number] Effective dates: 01/19/23 through 02/17/25  Archer Asa, CPhT

## 2023-02-18 NOTE — Telephone Encounter (Signed)
Patient Advocate Encounter   Received notification from [employer] that prior authorization for Adempas is required.   PA submitted on CoverMyMeds Key [id number] Status is pending   Will continue to follow.

## 2023-02-20 DIAGNOSIS — I2781 Cor pulmonale (chronic): Secondary | ICD-10-CM | POA: Diagnosis not present

## 2023-02-20 DIAGNOSIS — R079 Chest pain, unspecified: Secondary | ICD-10-CM | POA: Diagnosis not present

## 2023-02-20 DIAGNOSIS — J9621 Acute and chronic respiratory failure with hypoxia: Secondary | ICD-10-CM | POA: Diagnosis not present

## 2023-02-20 DIAGNOSIS — J440 Chronic obstructive pulmonary disease with acute lower respiratory infection: Secondary | ICD-10-CM | POA: Diagnosis not present

## 2023-02-20 DIAGNOSIS — A419 Sepsis, unspecified organism: Secondary | ICD-10-CM | POA: Diagnosis not present

## 2023-02-20 DIAGNOSIS — E1122 Type 2 diabetes mellitus with diabetic chronic kidney disease: Secondary | ICD-10-CM | POA: Diagnosis not present

## 2023-02-20 DIAGNOSIS — Z20822 Contact with and (suspected) exposure to covid-19: Secondary | ICD-10-CM | POA: Diagnosis not present

## 2023-02-20 DIAGNOSIS — Z7984 Long term (current) use of oral hypoglycemic drugs: Secondary | ICD-10-CM | POA: Diagnosis not present

## 2023-02-20 DIAGNOSIS — Z7901 Long term (current) use of anticoagulants: Secondary | ICD-10-CM | POA: Diagnosis not present

## 2023-02-20 DIAGNOSIS — J189 Pneumonia, unspecified organism: Secondary | ICD-10-CM | POA: Diagnosis not present

## 2023-02-20 DIAGNOSIS — J181 Lobar pneumonia, unspecified organism: Secondary | ICD-10-CM | POA: Diagnosis not present

## 2023-02-20 DIAGNOSIS — Z1152 Encounter for screening for COVID-19: Secondary | ICD-10-CM | POA: Diagnosis not present

## 2023-02-20 DIAGNOSIS — E1165 Type 2 diabetes mellitus with hyperglycemia: Secondary | ICD-10-CM | POA: Diagnosis not present

## 2023-02-20 DIAGNOSIS — R6520 Severe sepsis without septic shock: Secondary | ICD-10-CM | POA: Diagnosis not present

## 2023-02-20 DIAGNOSIS — Z743 Need for continuous supervision: Secondary | ICD-10-CM | POA: Diagnosis not present

## 2023-02-20 DIAGNOSIS — J441 Chronic obstructive pulmonary disease with (acute) exacerbation: Secondary | ICD-10-CM | POA: Diagnosis not present

## 2023-02-20 DIAGNOSIS — E860 Dehydration: Secondary | ICD-10-CM | POA: Diagnosis not present

## 2023-02-20 DIAGNOSIS — Z882 Allergy status to sulfonamides status: Secondary | ICD-10-CM | POA: Diagnosis not present

## 2023-02-20 DIAGNOSIS — I272 Pulmonary hypertension, unspecified: Secondary | ICD-10-CM | POA: Diagnosis not present

## 2023-02-20 DIAGNOSIS — I2510 Atherosclerotic heart disease of native coronary artery without angina pectoris: Secondary | ICD-10-CM | POA: Diagnosis not present

## 2023-02-20 DIAGNOSIS — I5032 Chronic diastolic (congestive) heart failure: Secondary | ICD-10-CM | POA: Diagnosis not present

## 2023-02-20 DIAGNOSIS — N183 Chronic kidney disease, stage 3 unspecified: Secondary | ICD-10-CM | POA: Diagnosis not present

## 2023-02-20 DIAGNOSIS — Z79899 Other long term (current) drug therapy: Secondary | ICD-10-CM | POA: Diagnosis not present

## 2023-02-20 DIAGNOSIS — I4510 Unspecified right bundle-branch block: Secondary | ICD-10-CM | POA: Diagnosis not present

## 2023-02-20 DIAGNOSIS — R0602 Shortness of breath: Secondary | ICD-10-CM | POA: Diagnosis not present

## 2023-02-20 DIAGNOSIS — Z9981 Dependence on supplemental oxygen: Secondary | ICD-10-CM | POA: Diagnosis not present

## 2023-02-20 DIAGNOSIS — I2724 Chronic thromboembolic pulmonary hypertension: Secondary | ICD-10-CM | POA: Diagnosis not present

## 2023-02-20 DIAGNOSIS — I21A1 Myocardial infarction type 2: Secondary | ICD-10-CM | POA: Diagnosis not present

## 2023-02-20 DIAGNOSIS — J9622 Acute and chronic respiratory failure with hypercapnia: Secondary | ICD-10-CM | POA: Diagnosis not present

## 2023-02-20 DIAGNOSIS — R000 Tachycardia, unspecified: Secondary | ICD-10-CM | POA: Diagnosis not present

## 2023-02-25 ENCOUNTER — Ambulatory Visit (HOSPITAL_COMMUNITY)
Admission: RE | Admit: 2023-02-25 | Discharge: 2023-02-25 | Disposition: A | Discharge status: Home / Self Care | Payer: Medicare Other | Source: Ambulatory Visit | Attending: Family Medicine | Admitting: Family Medicine

## 2023-02-25 ENCOUNTER — Encounter (HOSPITAL_COMMUNITY): Payer: Self-pay

## 2023-02-25 VITALS — BP 104/62 | HR 70 | Wt 188.6 lb

## 2023-02-25 DIAGNOSIS — I2510 Atherosclerotic heart disease of native coronary artery without angina pectoris: Secondary | ICD-10-CM | POA: Diagnosis not present

## 2023-02-25 DIAGNOSIS — J439 Emphysema, unspecified: Secondary | ICD-10-CM | POA: Insufficient documentation

## 2023-02-25 DIAGNOSIS — I272 Pulmonary hypertension, unspecified: Secondary | ICD-10-CM | POA: Diagnosis not present

## 2023-02-25 DIAGNOSIS — Z86711 Personal history of pulmonary embolism: Secondary | ICD-10-CM

## 2023-02-25 DIAGNOSIS — Z7901 Long term (current) use of anticoagulants: Secondary | ICD-10-CM | POA: Diagnosis not present

## 2023-02-25 DIAGNOSIS — I5032 Chronic diastolic (congestive) heart failure: Secondary | ICD-10-CM | POA: Diagnosis not present

## 2023-02-25 DIAGNOSIS — I130 Hypertensive heart and chronic kidney disease with heart failure and stage 1 through stage 4 chronic kidney disease, or unspecified chronic kidney disease: Secondary | ICD-10-CM | POA: Insufficient documentation

## 2023-02-25 DIAGNOSIS — J9610 Chronic respiratory failure, unspecified whether with hypoxia or hypercapnia: Secondary | ICD-10-CM | POA: Insufficient documentation

## 2023-02-25 DIAGNOSIS — K449 Diaphragmatic hernia without obstruction or gangrene: Secondary | ICD-10-CM | POA: Diagnosis not present

## 2023-02-25 DIAGNOSIS — E7800 Pure hypercholesterolemia, unspecified: Secondary | ICD-10-CM

## 2023-02-25 DIAGNOSIS — G4733 Obstructive sleep apnea (adult) (pediatric): Secondary | ICD-10-CM | POA: Diagnosis not present

## 2023-02-25 DIAGNOSIS — I2782 Chronic pulmonary embolism: Secondary | ICD-10-CM | POA: Insufficient documentation

## 2023-02-25 DIAGNOSIS — J432 Centrilobular emphysema: Secondary | ICD-10-CM

## 2023-02-25 DIAGNOSIS — Z79899 Other long term (current) drug therapy: Secondary | ICD-10-CM | POA: Insufficient documentation

## 2023-02-25 DIAGNOSIS — K56600 Partial intestinal obstruction, unspecified as to cause: Secondary | ICD-10-CM | POA: Diagnosis not present

## 2023-02-25 DIAGNOSIS — Z87891 Personal history of nicotine dependence: Secondary | ICD-10-CM | POA: Insufficient documentation

## 2023-02-25 DIAGNOSIS — K2100 Gastro-esophageal reflux disease with esophagitis, without bleeding: Secondary | ICD-10-CM | POA: Diagnosis not present

## 2023-02-25 DIAGNOSIS — N183 Chronic kidney disease, stage 3 unspecified: Secondary | ICD-10-CM | POA: Diagnosis not present

## 2023-02-25 DIAGNOSIS — J9611 Chronic respiratory failure with hypoxia: Secondary | ICD-10-CM

## 2023-02-25 DIAGNOSIS — I50810 Right heart failure, unspecified: Secondary | ICD-10-CM | POA: Diagnosis not present

## 2023-02-25 LAB — BASIC METABOLIC PANEL
Anion gap: 7 (ref 5–15)
BUN: 28 mg/dL — ABNORMAL HIGH (ref 8–23)
CO2: 32 mmol/L (ref 22–32)
Calcium: 9.9 mg/dL (ref 8.9–10.3)
Chloride: 96 mmol/L — ABNORMAL LOW (ref 98–111)
Creatinine, Ser: 1.67 mg/dL — ABNORMAL HIGH (ref 0.61–1.24)
GFR, Estimated: 40 mL/min — ABNORMAL LOW (ref >60)
Glucose, Bld: 127 mg/dL — ABNORMAL HIGH (ref 70–99)
Potassium: 3.8 mmol/L (ref 3.5–5.1)
Sodium: 135 mmol/L (ref 135–145)

## 2023-02-25 LAB — BRAIN NATRIURETIC PEPTIDE: B Natriuretic Peptide: 134.1 pg/mL — ABNORMAL HIGH (ref 0.0–100.0)

## 2023-02-25 MED ORDER — DAPAGLIFLOZIN PROPANEDIOL 10 MG PO TABS: 10.0000 mg | ORAL_TABLET | Freq: Every day | ORAL | 3 refills | Status: DC

## 2023-02-25 NOTE — Patient Instructions (Signed)
Medication Changes:  RESTART YOUR FARXIGA 10mg  ONCE DAILY   Lab Work:  Labs done today, your results will be available in MyChart, we will contact you for abnormal readings.   Follow-Up in: 3 MONTHS WITH Dr. Shirlee Latch PLEASE CALL OUR OFFICE AROUND SEPTEMBER TO GET SCHEDULED FOR YOUR APPOINTMENT. PHONE NUMBER IS [phone] OPTION 2    At the [facility], you and your health needs are our priority. We have a designated team specialized in the treatment of Heart Failure. This Care Team includes your primary Heart Failure Specialized Cardiologist (physician), Advanced Practice Providers (APPs- Physician Assistants and Nurse Practitioners), and Pharmacist who all work together to provide you with the care you need, when you need it.   You may see any of the following providers on your designated Care Team at your next follow up:  Dr. Arvilla Meres Dr. Marca Ancona Dr. Marcos Eke, NP Robbie Lis, [place] [facility] [place], [place] Brynda Peon, NP Karle Plumber, PharmD   Please be sure to bring in all your medications bottles to every appointment.   Need to Contact us:  If you have any questions or concerns before your next appointment please send [place] a message through [place] or call our office at [phone].    TO LEAVE A MESSAGE FOR THE NURSE SELECT OPTION 2, PLEASE LEAVE A MESSAGE INCLUDING: YOUR NAME DATE OF BIRTH CALL BACK NUMBER REASON FOR CALL**this is important as we prioritize the call backs  YOU WILL RECEIVE A CALL BACK THE SAME DAY AS LONG AS YOU CALL BEFORE 4:00 PM

## 2023-02-25 NOTE — Progress Notes (Signed)
PCP: [name], DO Cardiology: Dr. Excell Seltzer HF Cardiology: Dr. Shirlee Latch  87 y.o. with history of HFPEF, prior PE, and pulmonary hypertension was referred by Dr. Izora Ribas for evaluation of pulmonary hypertension.  Patient had a PE in 2016.  He is on apixaban. He has a long history of diastolic CHF.  Most recent echo showed a significant component of RV failure with EF 55-60%, IV septum flattened, severe RV enlargement, severely decreased RV function, PASP 57 mmHg.  CTA chest in 4/22 showed no acute PE and mild emphysema, but V/Q scan in 5/22 was suggestive of chronic PE in the right middle lobe.  RHC in 4/22 showed normal filling pressures with moderate PAH. He additionally has chronic hiccups followed by Dr. Elnoria Howard, now on gabapentin. PFTs in 6/22 showed mixed picture with severe obstruction, moderate restriction, and severely decreased DLCO.   Admitted 8/25-8/30/22 with A/C HF exacerbation. He was aggressively diuresed with lasix/metolazone, had unna boots, farxiga added.  Midodrine was increased for hypotension. Hospitalization c/b AKI on CKD III.  Readmitted 9/1-9/12/22 with shock (mixed septic and cardiogenic), likely 2/2 to aspiration pneumonia and A/C CHF.  Suspect possible gut translocation with ileus/partial SBO; CT abdomen with "nutcracker phenomenon".  He was started on NE + antibiotics. General surgery consulted and felt not to be a surgical candidate. Diuretics initially held due to over-diuresis and shock. Eventually able to wean pressors off, restart PO torsemide and midodrine. Hospitalization c/b transaminitis, delirium, and BPH with urinary retention, requiring foley catheter. Palliative care was consulted for GOC and patient DNR/DNI. PT/OT recommended HH. He was discharged home, weight 220 lbs.  EGD 11/22 showed esophagitis, hiatal hernia and yeast.   Acute visit 07/17/21 for weakness/falls. He had a syncopal episode in clinic. Labs showed hypokalemia of 3.3, SCr elevated at 2.13,  ReDs 27%. Marcelline Deist stopped, torsemide decreased to 40 mg daily and compression hose and abdominal binder ordered. 3-day Zio placed to evaluate for possible arrhythmogenic cause of symptoms.  Zio monitor showed 19 second run of VT.   Follow up 1/23, rare atypical chest pain. NYHA III, weight continued to trend down.  Admitted 2/23 with PNA and a/c CHF. Seen by palliative care and remained full code.  Admitted 8/23 with septic shock due to aspiration PNA & possible colitis. Echo showed EF 60-65%, RV severely reduced, severely enlarged, RVSP 50, LVEF 60-65%, no RWMA, mild TR, trivial MR. AHF consulted for elevated Hs trop. He underwent R/LHC showing nonobstructive mild CAD, normal/low filling pressures and mild PAH. Troponin elevation likely due to demand ischemia. Hospitalization c/b AKI on CKD. He was restarted on Farxiga and lower dose torsemide (10 mg daily). He was discharged home, weight 175 lbs.   He was admitted 11/23 with a/c respiratory failure, recently discharged (10/23)  after COVID-19 infection. Felt to be septic 2/2 PNA vs UTI. Received IV abx. HsTrop elevated on admission but felt to be demand ischemia. Stable from a HF standpoint and continued on torsemide, midodrine and Farxiga. He was discharged home with HH, weight 188 lbs.  Echo 5/24 showed EF 60-65%, grade I DD, RV function moderately reduced and RV severely enlarged  Admitted over July 4th holiday (in [place] hospital/[facility]) with PNA and a/c COPD. Given steroids, nebs and abx. Marcelline Deist was stopped.   Today he returns for HF follow up with his family Overall feeling a lot better since hospitalization, breathing is stable. He has no SOB walking on flat ground, mild SOB walking up steps. Has occasional burning chest discomfort at night,  resolves with drinking water. Occurs once a week, he has acid reflux. Has some ankle swelling. Denies palpitations, abnormal bleeding, dizziness, or PND/Orthopnea. Appetite ok. No fever or chills.  Weight at home 188 pounds. Taking all medications. Wears 2L oxygen.He self-caths 3x/day.  ECG (personally reviewed): NSR, RBBB  REDs: 23%  Labs (4/22): K 4.8, creatinine 1.67 Labs (6/22): K 3.8, creatinine 1.72, ANA negative, RF 39.6 but CCP negative, SCL-70 negative, BNP 1231, HIV negative Labs (7/22): K 3.8, creatine 1.67 Labs (8/22): K 3.6, creatinine 1.6, hgb 15.3  Labs (9/22): K 5.0, creatinine 1.2, hgb 14.6 Labs (10/22): K 3.4, creatinine 1.37 Labs (10/22): K 3.5, creatinine 1.34 Labs (12/22): K 4.5, creatinine 1.81 Labs (2/23): K 3.8, creatinine 1.20 Labs (5/23): K 4.3, creatinine 1.5, BNP 84 Labs (8/23): K 4.5, creatinine 1.00 Labs (10/23): K 3.9, creatinine 1.49, AST 43, ALT normal, BNP 1164 Labs (12/23): K 4.1, creatinine 1.17 Labs (2/24): K 3.6, creatinine 1.64, hgb 11.4, BNP 90 Labs (5/24): K 3.7, creatinine 1.66 Labs (7/24): K 4.2, creatinine 1.07  6 minute walk (7/22): 213 m 6 minute walk (10/22): 274 m (oxygen saturation dropped to 70s on 2L [place])  PMH: 1. HFPEF: With prominent RV failure.   - Echo (2/22): EF 55-60%, IV septum flattened, severe RV enlargement, severely decreased RV function, PASP 57 mmHg.  - Echo (8/23): EF 60-65%, RV severely reduced, severely enlarged, RVSP 50, LVEF 60-65%, no RWMA, mild TR, trivial MR.  - Echo (5/24): EF 60-65%, grade I DD, RV function moderately reduced and RV severely enlarged 2. Venous thromboembolic disease: PE in 2016.   - Venous dopplers (4/22): No DVT.  - CTA chest (4/22): No PE.  - V/Q scan 5/22 with perfusion defect in the RML consistent with chronic PE.  3. OSA: Did not tolerate CPAP.   4. HTN 5. COPD: Prior smoker.   - CTA chest in 4/22 showed no PE, mild emphysema.  - PFTs (6/22) with severe obstruction, moderate restriction, severely decreased DLCO 6. Pulmonary hypertension: RHC (4/22) with mean RA 5, PA 65/19 mean 36, mean PCWP 5, CI 2.19, PVR 6.1 WU, PAPi 9.2.  - RHC (8/23) with mean RA 1, PA 46/10 mean 25, mean  PCWP 4, CI 2.98, PVR 3.4 WU 7. Chronic hiccups 8. BPH: Has to in and out cath at times. 9. Syncope: Zio 12/22 with 19 second VT run.  10. Chronic hiccups 11. 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 12. CAD: LHC (8/23) with nonobstructive CAD.   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 Stopped working in  2022  Tobacco Use   Smoking status: Former    Packs/day: 1.00    Years: 50.00    Additional pack years: 0.00    Total pack years: 50.00    Types: Cigarettes    Start date: 74    Quit date: 08/19/2006    Years since quitting: 16.5   Smokeless tobacco: Never   Tobacco comments:    Former smoke 03/15/22  Vaping Use   Vaping Use: Never used  Substance and Sexual Activity   Alcohol use: Not Currently    Alcohol/week: 6.0 standard drinks of alcohol    Types:  6 Shots of liquor per week   Drug use: No   Sexual activity: Not on file  Other Topics Concern   Not on file  Social History Narrative   Not on file   Social Determinants of Health   Financial Resource Strain: Not on file  Food Insecurity: Patient Declined (07/18/2022)   Hunger Vital Sign    Worried About Running Out of Food in the Last Year: Patient declined    Ran Out of Food in the Last Year: Patient declined  Transportation Needs: No Transportation Needs (07/18/2022)   PRAPARE - Administrator, Civil Service (Medical): No    Lack of Transportation (Non-Medical): No  Physical Activity: Not on file  Stress: No Stress Concern Present (05/09/2021)   [employer] of Occupational Health - Occupational Stress Questionnaire    Feeling of Stress : Only a little  Social Connections: Not on  file  Intimate Partner Violence: Not At Risk (07/18/2022)   Humiliation, Afraid, Rape, and Kick questionnaire    Fear of Current or Ex-Partner: No    Emotionally Abused: No    Physically Abused: No    Sexually Abused: No   Family History  Problem Relation Age of Onset   Heart attack Brother 57   ROS: All systems reviewed and negative except as per HPI.   Current Outpatient Medications  Medication Sig Dispense Refill   Acidophilus Lactobacillus CAPS Take 1 capsule by mouth daily.     albuterol (VENTOLIN HFA) 108 (90 Base) MCG/ACT inhaler Inhale 1 puff into the lungs every 4 (four) hours as needed for shortness of breath.     allopurinol (ZYLOPRIM) 100 MG tablet Take 100 mg by mouth every morning.     atorvastatin (LIPITOR) 40 MG tablet TAKE 1 TABLET BY MOUTH EVERY DAY 90 tablet 2   Cinnamon 500 MG capsule Take 500 mg by mouth every morning.     ELIQUIS 5 MG TABS tablet TAKE 1 TABLET BY MOUTH TWICE A DAY 60 tablet 11   finasteride (PROSCAR) 5 MG tablet Take 5 mg by mouth at bedtime.     gabapentin (NEURONTIN) 300 MG capsule Take 300 mg by mouth 3 (three) times daily.     guaiFENesin (MUCINEX) 600 MG 12 hr tablet Take 600 mg by mouth 2 (two) times daily.     levofloxacin (LEVAQUIN) 750 MG tablet Take 750 mg by mouth every other day.     LINZESS 72 MCG capsule Take 72 mcg by mouth every morning.     magnesium oxide (MAG-OX) 400 (240 Mg) MG tablet TAKE 1 TABLET BY MOUTH EVERY DAY 90 tablet 1   metoCLOPramide (REGLAN) 5 MG tablet Take 5 mg by mouth 3 (three) times daily before meals.     midodrine (PROAMATINE) 5 MG tablet Take 3 tablets (15 mg total) by mouth 3 (three) times daily. 810 tablet 3   Multiple Vitamin (MULTIVITAMIN WITH MINERALS) TABS tablet Take 1 tablet by mouth daily. 30 tablet 0   OXYGEN Inhale 2 L into the lungs continuous.     pantoprazole (PROTONIX) 40 MG tablet Take 40 mg by mouth every morning.     promethazine-dextromethorphan (PROMETHAZINE-DM) 6.25-15 MG/5ML syrup  Take 5 mLs by mouth every 6 (six) hours as needed for cough (for cough). 118 mL 0   Riociguat 2 MG TABS Take 2 mg by mouth in the morning, at noon, and at bedtime. 90 tablet 11   Tiotropium Bromide-Olodaterol (STIOLTO RESPIMAT) 2.5-2.5 MCG/ACT AERS Inhale 2 puffs  into the lungs daily. 4 g 0   torsemide (DEMADEX) 20 MG tablet Take 20 mg by mouth daily.     No current facility-administered medications for this encounter.   Wt Readings from Last 3 Encounters:  02/25/23 85.5 kg (188 lb 9.6 oz)  02/10/23 83.2 kg (183 lb 6.4 oz)  12/18/22 80.7 kg (178 lb)   BP 104/62   Pulse 70   Wt 85.5 kg (188 lb 9.6 oz)   SpO2 95%   BMI 23.57 kg/m  Physical Exam General:  NAD. No resp difficulty, walked into clinic on 2L oxygen HEENT: Normal Neck: Supple. No JVD, dilated EJs. Carotids 2+ bilat; no bruits. No lymphadenopathy or thryomegaly appreciated. Cor: PMI nondisplaced. Regular rate & rhythm. No rubs, gallops or murmurs. Lungs: Rhonchi in lower lobes Abdomen: Soft, nontender, nondistended. No hepatosplenomegaly. No bruits or masses. Good bowel sounds. Extremities: No cyanosis, clubbing, rash, 1+ BLE edema Neuro: Alert & oriented x 3, cranial nerves grossly intact. Moves all 4 extremities w/o difficulty. Affect pleasant.  Assessment/Plan: 1. Chronic HFpEF/RV failure: Echo (2/22) with EF 55-60%, IV septum flattened, severe RV enlargement, severely decreased RV function, PASP 57 mmHg.  He has severe RV failure at baseline.  Echo this admit 8/23 >>RV severely reduced, severely enlarged, RVSP 50, LVEF 60-65%, no RWMA, mild TR, trivial MR.  RHC 8/23 showed normal/low filling pressures and mild PAH. Echo 5/24 showed EF 60-65%, RV moderately reduced. NYHA class II, as long as he is wearing his oxygen. He is mildly volume overloaded on exam, REDs 23%, R>L HF. Midodrine seems to be controlling his orthostasis.  - Restart Farxiga 10 mg daily. BMET and BNP today. - Continue torsemide 20 mg daily.  - Continue  midodrine 15 mg tid. Would avoid fludrocortisone. - Recommend compression hose. 2. Pulmonary HTN: PAH noted on 4/22 RHC with PVR 6.1 WU.  This appears to be multifactorial with OSA, severe emphysema, and a suspected chronic PE involving the right middle lobe (group 3 and group 4 PH). Given the suspected mixed etiology with only 1 area of chronic thromboembolism (right middle lobe) as well as age, do not think that pulmonary thromboendarterectomy would be indicated.  Rheumatologic serologic workup was negative.  PFTs showed severe obstruction and moderate restriction, suggesting significant COPD. RHC 8/23 showed mild pulmonary hypertension with PVR 3.4 WU.  - Continue riociguat.  - Holding off Tyvaso for now, suspect large group 3 PH component and only mildly elevated PA pressure on 8/23 RHC.  3. Chronic respiratory failure: Admission 10/23 for COVID. Recent admission for PNA.  - Stable on 2L [place] 4. CKD III: BMET today. 5. CAD: nonobstructive on [facility] 8/23. Recent chest discomfort sounds like GERD, ECG without acute changes today. - No ASA with need for [facility] - Continue statin.  6. OSA: Moderate OSA on sleep study. He did not tolerate CPAP.  Interested in [place] device.  - He has appt with Dr. Mayford Knife this month to see if he would be an Insp[facility] candidate.  7. Emphysema: Prior smoker.  Emphysema on CT and severe obstruction on PFTs. On 2L home oxygen. 8. Chronic PE: Diagnosed by V/Q scan.  No abnormal bleeding. He has a probable chronic RLE DVT.   - Continue Eliquis. No bleeding issues 9. Hiatial hernia w/ esophagitis: Has significant GERD. He follows with Dr. Elnoria Howard. - Continue PPI.  10. Nutcracker SMA: Compresses duodenum  Follow up in 3 months with Dr. Kathreen Cornfield [facility] FNP-BC 02/25/23

## 2023-02-25 NOTE — Progress Notes (Signed)
ReDS Vest / Clip - 02/25/23 1100       ReDS Vest / Clip   Station Marker C    Ruler Value 28    ReDS Value Range Low volume    ReDS Actual Value 23    Anatomical Comments sitting

## 2023-02-26 ENCOUNTER — Ambulatory Visit: Payer: Medicare Other | Admitting: Nurse Practitioner

## 2023-03-07 ENCOUNTER — Encounter: Payer: Self-pay | Admitting: Cardiology

## 2023-03-07 ENCOUNTER — Ambulatory Visit: Payer: Medicare Other | Admitting: Cardiology

## 2023-03-07 ENCOUNTER — Encounter: Payer: Self-pay | Admitting: Nurse Practitioner

## 2023-03-07 ENCOUNTER — Ambulatory Visit (INDEPENDENT_AMBULATORY_CARE_PROVIDER_SITE_OTHER): Payer: Medicare Other | Admitting: Nurse Practitioner

## 2023-03-07 VITALS — BP 110/66 | HR 72 | Temp 98.3°F | Ht 75.0 in | Wt 181.0 lb

## 2023-03-07 VITALS — BP 100/50 | HR 78 | Ht 75.0 in | Wt 177.0 lb

## 2023-03-07 DIAGNOSIS — G4733 Obstructive sleep apnea (adult) (pediatric): Secondary | ICD-10-CM | POA: Insufficient documentation

## 2023-03-07 DIAGNOSIS — K219 Gastro-esophageal reflux disease without esophagitis: Secondary | ICD-10-CM

## 2023-03-07 DIAGNOSIS — J189 Pneumonia, unspecified organism: Secondary | ICD-10-CM | POA: Diagnosis not present

## 2023-03-07 DIAGNOSIS — J432 Centrilobular emphysema: Secondary | ICD-10-CM | POA: Diagnosis not present

## 2023-03-07 DIAGNOSIS — I5032 Chronic diastolic (congestive) heart failure: Secondary | ICD-10-CM | POA: Diagnosis not present

## 2023-03-07 DIAGNOSIS — I272 Pulmonary hypertension, unspecified: Secondary | ICD-10-CM

## 2023-03-07 DIAGNOSIS — J9611 Chronic respiratory failure with hypoxia: Secondary | ICD-10-CM | POA: Diagnosis not present

## 2023-03-07 MED ORDER — PANTOPRAZOLE SODIUM 40 MG PO TBEC: 40.0000 mg | DELAYED_RELEASE_TABLET | Freq: Every morning | ORAL | 5 refills | Status: DC

## 2023-03-07 NOTE — Patient Instructions (Signed)
Medication Instructions:  Your physician recommends that you continue on your current medications as directed. Please refer to the Current Medication list given to you today.  *If you need a refill on your cardiac medications before your next appointment, please call your pharmacy*   Lab Work: None.  If you have labs (blood work) drawn today and your tests are completely normal, you will receive your results only by: [place] (if you have MyChart) OR A paper copy in the mail If you have any lab test that is abnormal or we need to change your treatment, we will call you to review the results.   Testing/Procedures: None.   Follow-Up: At [facility], you and your health needs are our priority.  As part of our continuing mission to provide you with exceptional heart care, we have created designated Provider Care Teams.  These Care Teams include your primary Cardiologist (physician) and Advanced Practice Providers (APPs -  Physician Assistants and Nurse Practitioners) who all work together to provide you with the care you need, when you need it.  We recommend signing up for the patient portal called "MyChart".  Sign up information is provided on this After Visit Summary.  MyChart is used to connect with patients for Virtual Visits (Telemedicine).  Patients are able to view lab/test results, encounter notes, upcoming appointments, etc.  Non-urgent messages can be sent to your provider as well.   To learn more about what you can do with MyChart, go to [email or web address].    Your next appointment will be as needed and it will be with:    Provider:   Dr. Fransico Him, MD

## 2023-03-07 NOTE — Assessment & Plan Note (Signed)
Resolved exacerbation. Clinically improved. No perceived benefit from [place] in the past. He will continue LABA/LAMA therapy with Stiolto. Action plan in place. Encouraged to remain active.

## 2023-03-07 NOTE — Assessment & Plan Note (Signed)
Back to baseline oxygen use 2 lpm. Goal >88-90%

## 2023-03-07 NOTE — Progress Notes (Signed)
@Patient  ID: Joshua [name], male    DOB: February 13, 1936, 87 y.o.   MRN: [id number]  Chief Complaint  Patient presents with   Hospitalization Follow-up    He was admitted to a hospital if [place] 02/20/23-02/21/23 for PNA. Breathing is at baseline today.     Referring provider: [name], DO  HPI: 87 year old male, former smoker followed for COPD, chronic pulmonary emboli, CTEPH and chronic respiratory failure on supplemental O2.  He is a patient Dr. Reginia Naas and last seen in office 02/10/2023.  Past medical history significant for CHF, pulmonary hypertension, CKD stage III, CAD, OSA, GERD, HLD.  TEST/EVENTS:  01/29/2021 PFT: FVC 57, FEV1 47, ratio 61, TLC 77, DLCOunc 35 03/18/2022 echo: EF 60 to 65%.  G1 DD.  RV systolic function severely reduced and enlarged.  Moderately elevated PASP.  Trivial MR.  Mild to moderate AR. 09/23/2022 CXR: New interstitial and airspace opacity at the right lung base.  Since previous exam there is improved aeration of the left lung.  Emphysematous changes.  02/10/2023: OV with Dr. Vassie Loll. Pneumonia in February 2024. Breathing back to baseline. Switched from [employer] to [place] at last visit. Finds it very expensive and doesn't work any better so would like to go back.   03/07/2023: Today - follow up Patient presents today for hospital follow up. He was admitted in [place] on July 4th for sepsis secondary to pneumonia after presenting with increased work of breathing, paroxysmal cough with minimal expectoration and rise in oxygen demand. CTA chest was negative for PE, positive for dense consolidation with air bronchograms in LLL, atelectasis vs infiltrate in RLL and minimal atelectasis in RML and lingula. He was treated with ceftriaxone/azithromycin and transitioned to levaquin for 10 days. He was discharged hom on 7/5 with strict return precautions and advised close follow up. Today, he tells me he is feeling much better. Cough is not entirely resolve but improved. Not producing any  sputum. Feels like his breathing is back to his baseline. He is not having chest congestion, wheezing, fevers, chills, hemoptysis. Eating and drinking well. Using 2 lpm POC without desaturations at home. He is back on his Stiolto. Not using his rescue. He does need a refill of his protonix. He tells me that his reflux is worse without this. He has been out for 2 weeks. He does not have any difficulties swallowing. No coughing with eating. He has a known hiatal hernia.   Allergies  Allergen Reactions   Other Swelling    Beer- Swelling    Sunflower Oil Swelling   Sulfa Antibiotics Rash    Immunization History  Administered Date(s) Administered   DTaP 08/20/2007   Influenza, High Dose Seasonal PF 07/20/2014, 07/27/2015, 05/27/2017   Influenza, Quadrivalent, Recombinant, Inj, Pf 05/16/2021   Influenza,inj,Quad PF,6+ Mos 07/19/2014, 05/29/2015, 05/07/2018   Influenza-Unspecified 07/31/2011, 06/11/2012, 06/16/2013, 05/19/2016   Moderna Sars-Covid-2 Vaccination 09/01/2019, 09/29/2019, 06/11/2020, 11/24/2020, 12/04/2020   PNEUMOCOCCAL CONJUGATE-20 07/19/2022   Pneumococcal Polysaccharide-23 10/14/2005, 12/29/2008   Tdap 08/01/2018    Past Medical History:  Diagnosis Date   Arthritis    BPH (benign prostatic hyperplasia)    Chronic hiccups    COPD (chronic obstructive pulmonary disease) (HCC)    Folliculitis    posterior scalp per office visit note of Dr Selena Batten 07/20/2014     GERD (gastroesophageal reflux disease)    Gout    Hypertension    Hypotension    NSTEMI (non-ST elevated myocardial infarction) (HCC) 03/19/2022   PE (pulmonary thromboembolism) (HCC)  Phlebitis    right arm  at least 20 years ago    Pneumonia    hx of pneumonia as a child    Pulmonary embolism (HCC) 03/17/2022   Pulmonary hypertension (HCC)    Severe sepsis (HCC) 03/16/2022   Sleep apnea     Tobacco History: Social History   Tobacco Use  Smoking Status Former   Current packs/day: 0.00   Average packs/day:  1 pack/day for 52.0 years (52.0 ttl pk-yrs)   Types: Cigarettes   Start date: 6   Quit date: 08/19/2006   Years since quitting: 16.5  Smokeless Tobacco Never  Tobacco Comments   Former smoke 03/15/22   Counseling given: Not Answered Tobacco comments: Former smoke 03/15/22   Outpatient Medications Prior to Visit  Medication Sig Dispense Refill   Acidophilus Lactobacillus CAPS Take 1 capsule by mouth daily.     albuterol (VENTOLIN HFA) 108 (90 Base) MCG/ACT inhaler Inhale 1 puff into the lungs every 4 (four) hours as needed for shortness of breath.     allopurinol (ZYLOPRIM) 100 MG tablet Take 100 mg by mouth every morning.     atorvastatin (LIPITOR) 40 MG tablet TAKE 1 TABLET BY MOUTH EVERY DAY 90 tablet 2   Cinnamon 500 MG capsule Take 500 mg by mouth every morning.     dapagliflozin propanediol (FARXIGA) 10 MG TABS tablet Take 1 tablet (10 mg total) by mouth daily before breakfast. 90 tablet 3   ELIQUIS 5 MG TABS tablet TAKE 1 TABLET BY MOUTH TWICE A DAY 60 tablet 11   finasteride (PROSCAR) 5 MG tablet Take 5 mg by mouth at bedtime.     gabapentin (NEURONTIN) 300 MG capsule Take 300 mg by mouth 3 (three) times daily.     LINZESS 72 MCG capsule Take 72 mcg by mouth every morning.     magnesium oxide (MAG-OX) 400 (240 Mg) MG tablet TAKE 1 TABLET BY MOUTH EVERY DAY 90 tablet 1   metoCLOPramide (REGLAN) 5 MG tablet Take 5 mg by mouth 3 (three) times daily before meals.     midodrine (PROAMATINE) 5 MG tablet Take 3 tablets (15 mg total) by mouth 3 (three) times daily. 810 tablet 3   Multiple Vitamin (MULTIVITAMIN WITH MINERALS) TABS tablet Take 1 tablet by mouth daily. 30 tablet 0   OXYGEN Inhale 2 L into the lungs continuous.     Riociguat 2 MG TABS Take 2 mg by mouth in the morning, at noon, and at bedtime. 90 tablet 11   Tiotropium Bromide-Olodaterol (STIOLTO RESPIMAT) 2.5-2.5 MCG/ACT AERS Inhale 2 puffs into the lungs daily. 4 g 0   torsemide (DEMADEX) 20 MG tablet Take 20 mg by  mouth daily.     pantoprazole (PROTONIX) 40 MG tablet Take 40 mg by mouth every morning.     promethazine-dextromethorphan (PROMETHAZINE-DM) 6.25-15 MG/5ML syrup Take 5 mLs by mouth every 6 (six) hours as needed for cough (for cough). 118 mL 0   No facility-administered medications prior to visit.     Review of Systems:   Constitutional: No weight loss or gain, night sweats, fevers, chills. +fatigue, lassitude (improving) HEENT: No headaches, difficulty swallowing, tooth/dental problems, or sore throat. No sneezing, itching, ear ache, nasal congestion, or post nasal drip CV:  No chest pain, orthopnea, PND, swelling in lower extremities, anasarca, dizziness, palpitations, syncope Resp: +shortness of breath with exertion (improving); minimal dry cough. No excess mucus or change in color of mucus. No hemoptysis. No wheezing.  No chest wall deformity  GI:  No heartburn, indigestion, abdominal pain, nausea, vomiting, diarrhea, change in bowel habits, loss of appetite, bloody stools.  GU: No dysuria, change in color of urine, urgency or frequency. Skin: No rash, lesions, ulcerations MSK:  No joint pain or swelling.   Neuro: No dizziness or lightheadedness.  Psych: No depression or anxiety. Mood stable.     Physical Exam:  BP 110/66 (BP Location: Left Arm, Cuff Size: Normal)   Pulse 72   Temp 98.3 F (36.8 C) (Oral)   Ht 6\' 3"  (1.905 m)   Wt 181 lb (82.1 kg)   SpO2 92% Comment: 2 lpm pulsed  BMI 22.62 kg/m   GEN: Pleasant, interactive, well-kempt, chronically-ill appearing; in no acute distress HEENT:  Normocephalic and atraumatic. PERRLA. Sclera white. Nasal turbinates pink, moist and patent bilaterally. No rhinorrhea present. Oropharynx pink and moist, without exudate or edema. No lesions, ulcerations, or postnasal drip.  NECK:  Supple w/ fair ROM. No JVD present. Normal carotid impulses w/o bruits. Thyroid symmetrical with no goiter or nodules palpated. No lymphadenopathy.   CV: RRR,  no m/r/g, no peripheral edema. Pulses intact, +2 bilaterally. No cyanosis, pallor or clubbing. PULMONARY:  Unlabored, regular breathing. Diminished bilaterally A&P w/o wheezes/rales/rhonchi. No accessory muscle use.  GI: BS present and normoactive. Soft, non-tender to palpation. No organomegaly or masses detected.  MSK: No erythema, warmth or tenderness. Cap refil <2 sec all extrem. No deformities or joint swelling noted. Muscle wasting Neuro: A/Ox3. No focal deficits noted.   Skin: Warm, no lesions or rashe Psych: Normal affect and behavior. Judgement and thought content appropriate.     Lab Results:  CBC    Component Value Date/Time   WBC 6.6 09/23/2022 1119   RBC 4.29 09/23/2022 1119   HGB 11.4 (L) 09/23/2022 1119   HGB 15.4 12/07/2020 1100   HCT 36.0 (L) 09/23/2022 1119   HCT 48.7 12/07/2020 1100   PLT 202 09/23/2022 1119   PLT 191 12/07/2020 1100   MCV 83.9 09/23/2022 1119   MCV 88 12/07/2020 1100   MCH 26.6 09/23/2022 1119   MCHC 31.7 09/23/2022 1119   RDW 15.9 (H) 09/23/2022 1119   RDW 14.0 12/07/2020 1100   LYMPHSABS 0.6 (L) 07/19/2022 0241   MONOABS 0.7 07/19/2022 0241   EOSABS 0.5 07/19/2022 0241   BASOSABS 0.0 07/19/2022 0241    BMET    Component Value Date/Time   NA 135 02/25/2023 1142   NA 137 12/07/2020 1100   K 3.8 02/25/2023 1142   CL 96 (L) 02/25/2023 1142   CO2 32 02/25/2023 1142   GLUCOSE 127 (H) 02/25/2023 1142   BUN 28 (H) 02/25/2023 1142   BUN 31 (H) 12/07/2020 1100   CREATININE 1.67 (H) 02/25/2023 1142   CALCIUM 9.9 02/25/2023 1142   GFRNONAA 40 (L) 02/25/2023 1142   GFRAA >60 08/01/2018 1056    BNP    Component Value Date/Time   BNP 134.1 (H) 02/25/2023 1142     Imaging:  No results found.  Administration History     None          Latest Ref Rng & Units 01/29/2021    9:43 AM 12/12/2014    8:49 AM  PFT Results  FVC-Pre L 2.54  2.75   FVC-Predicted Pre % 57  63   FVC-Post L 2.69  2.85   FVC-Predicted Post % 61  66    Pre FEV1/FVC % % 60  70   Post FEV1/FCV % % 61  66  FEV1-Pre L 1.51  1.93   FEV1-Predicted Pre % 47  59   FEV1-Post L 1.65  1.89   DLCO uncorrected ml/min/mmHg 9.97  13.26   DLCO UNC% % 35  35   DLVA Predicted % 62  62   TLC L 6.26  5.41   TLC % Predicted % 77  68   RV % Predicted % 115  93     No results found for: "NITRICOXIDE"      Assessment & Plan:   CAP (community acquired pneumonia) Admission 7/4-7/5 for sepsis secondary to pneumonia. He had a dense consolidation in the LLL on CT imaging. He has completed levaquin course. Clinically improved. Plan to repeat CT chest in 6-8 weeks to ensure resolution. Target mucociliary clearance therapies. He is up to date on pneumovax. This is his second bout of pna this year. Will obtain swallow study to evaluate for aspiration risk. He does have a hiatal hernia which could be contributing to GERD/recurrent aspiration. He's on PPI - will refill today. Understands risks of long term use. Aspiration and GERD precautions reviewed.  Patient Instructions  Continue Albuterol inhaler 2 puffs or 3 mL neb every 6 hours as needed for shortness of breath or wheezing. Notify if symptoms persist despite rescue inhaler/neb use. Use flutter valve 10 x after neb treatments for congestion/cough Continue Stiolto 2 puffs Twice daily  Continue supplemental oxygen 2 lpm for goal oxygen >88-90% Use mucinex 600 mg Twice daily as needed for cough/congestion  Swallow study ordered  Repeat CT chest in 6-8 weeks    Follow up in 8 weeks with Dr. Vassie Loll (1sT) or Katie Rosalin Buster,NP to review CT results. If symptoms do not improve or worsen, please contact office for sooner follow up or seek emergency care.    COPD with emphysema (HCC) Resolved exacerbation. Clinically improved. No perceived benefit from [place] in the past. He will continue LABA/LAMA therapy with Stiolto. Action plan in place. Encouraged to remain active.   Chronic respiratory failure with hypoxia  (HCC) - on home O2 @ 2 L/min Back to baseline oxygen use 2 lpm. Goal >88-90%  Chronic diastolic CHF (congestive heart failure) (HCC) Euvolemic on exam. Follow up with cardiology as scheduled  Pulmonary hypertension (HCC) Euvolemic on exam. Weight stable. No changes to regimen.    I spent 42 minutes of dedicated to the care of this patient on the date of this encounter to include pre-visit review of records, face-to-face time with the patient discussing conditions above, post visit ordering of testing, clinical documentation with the electronic health record, making appropriate referrals as documented, and communicating necessary findings to members of the patients care team.  Noemi Chapel, NP 03/07/2023  Pt aware and understands NP's role.

## 2023-03-07 NOTE — Assessment & Plan Note (Signed)
Euvolemic on exam. Weight stable. No changes to regimen.

## 2023-03-07 NOTE — Patient Instructions (Signed)
Continue Albuterol inhaler 2 puffs or 3 mL neb every 6 hours as needed for shortness of breath or wheezing. Notify if symptoms persist despite rescue inhaler/neb use. Use flutter valve 10 x after neb treatments for congestion/cough Continue Stiolto 2 puffs Twice daily  Continue supplemental oxygen 2 lpm for goal oxygen >88-90% Use mucinex 600 mg Twice daily as needed for cough/congestion  Swallow study ordered  Repeat CT chest in 6-8 weeks    Follow up in 8 weeks with Dr. Vassie Loll (1sT) or Katie Claudett Bayly,NP to review CT results. If symptoms do not improve or worsen, please contact office for sooner follow up or seek emergency care.

## 2023-03-07 NOTE — Progress Notes (Signed)
Sleep Medicine CONSULT Note    Date:  03/07/2023   ID:  Joshua [name], DOB [birth year], MRN [id number]  PCP:  [name], DO  Cardiologist: Tonny Bollman, MD   Chief Complaint  Patient presents with   New Patient (Initial Visit)    Obstructive sleep apnea    History of Present Illness:  Joshua [name] is a 87 y.o. male who is being seen today for the evaluation of OSA at the request of Prince Rome, NP.  87 y.o. with history of HFPEF, prior PE, and pulmonary hypertension was referred by Dr. Izora Ribas for evaluation of pulmonary hypertension.  Patient had a PE in 2016.  He is on apixaban. He has a long history of diastolic CHF.  Most recent echo showed a significant component of RV failure with EF 55-60%, IV septum flattened, severe RV enlargement, severely decreased RV function, PASP 57 mmHg.   RHC in 4/22 showed normal filling pressures with moderate PAH. He additionally has chronic hiccups followed by Dr. Elnoria Howard, now on gabapentin. PFTs in 6/22 showed mixed picture with severe obstruction, moderate restriction, and severely decreased DLCO.   He has a history of obstructive sleep apnea initially diagnosed in 2022 with a sleep study showing moderate obstructive sleep apnea with an AHI 27.5/h and nocturnal hypoxemia.  A CPAP titration was recommended but the patient never followed through with this as he did not want to try CPAP.  He is now referred by advanced heart failure for sleep medicine consultation to discuss treatment of his obstructive sleep apnea.  The patient is interested in the inspire device.  Past Medical History:  Diagnosis Date   Arthritis    BPH (benign prostatic hyperplasia)    CHF (congestive heart failure) (HCC)    Chronic hiccups    COPD (chronic obstructive pulmonary disease) (HCC)    Folliculitis    posterior scalp per office visit note of Dr Selena Batten 07/20/2014     GERD (gastroesophageal reflux disease)    Gout    Hypertension    Hypotension     NSTEMI (non-ST elevated myocardial infarction) (HCC) 03/19/2022   PE (pulmonary thromboembolism) (HCC)    Phlebitis    right arm  at least 20 years ago    Pneumonia    hx of pneumonia as a child    Pulmonary embolism (HCC) 03/17/2022   Pulmonary hypertension (HCC)    Severe sepsis (HCC) 03/16/2022   Sleep apnea     Past Surgical History:  Procedure Laterality Date   BIOPSY  11/03/2020   Procedure: BIOPSY;  Surgeon: Jeani Hawking, MD;  Location: [facility];  Service: Endoscopy;;   BIOPSY  07/06/2021   Procedure: BIOPSY;  Surgeon: Jeani Hawking, MD;  Location: [facility];  Service: Endoscopy;;   bone removed from little toe right foot      CHOLECYSTECTOMY     ESOPHAGOGASTRODUODENOSCOPY (EGD) WITH PROPOFOL N/A 11/03/2020   Procedure: ESOPHAGOGASTRODUODENOSCOPY (EGD) WITH PROPOFOL;  Surgeon: Jeani Hawking, MD;  Location: [facility];  Service: Endoscopy;  Laterality: N/A;   ESOPHAGOGASTRODUODENOSCOPY (EGD) WITH PROPOFOL N/A 07/06/2021   Procedure: ESOPHAGOGASTRODUODENOSCOPY (EGD) WITH PROPOFOL;  Surgeon: Jeani Hawking, MD;  Location: [facility];  Service: Endoscopy;  Laterality: N/A;   pilonidal cyst removal      RIGHT HEART CATH N/A 12/13/2020   Procedure: RIGHT HEART CATH;  Surgeon: Laurey Morale, MD;  Location: [facility] INVASIVE CV LAB;  Service: Cardiovascular;  Laterality: N/A;   RIGHT/LEFT HEART CATH AND CORONARY ANGIOGRAPHY N/A  03/21/2022   Procedure: RIGHT/LEFT HEART CATH AND CORONARY ANGIOGRAPHY;  Surgeon: Laurey Morale, MD;  Location: [facility] INVASIVE CV LAB;  Service: Cardiovascular;  Laterality: N/A;   TOTAL KNEE ARTHROPLASTY Left 09/13/2014   Procedure: LEFT TOTAL KNEE ARTHROPLASTY;  Surgeon: Shelda Pal, MD;  Location: [facility];  Service: Orthopedics;  Laterality: Left;    Current Medications: Current Meds  Medication Sig   Acidophilus Lactobacillus CAPS Take 1 capsule by mouth daily.   albuterol (VENTOLIN HFA) 108 (90 Base) MCG/ACT inhaler Inhale 1 puff into the lungs  every 4 (four) hours as needed for shortness of breath.   allopurinol (ZYLOPRIM) 100 MG tablet Take 100 mg by mouth every morning.   atorvastatin (LIPITOR) 40 MG tablet TAKE 1 TABLET BY MOUTH EVERY DAY   Cinnamon 500 MG capsule Take 500 mg by mouth every morning.   dapagliflozin propanediol (FARXIGA) 10 MG TABS tablet Take 1 tablet (10 mg total) by mouth daily before breakfast.   ELIQUIS 5 MG TABS tablet TAKE 1 TABLET BY MOUTH TWICE A DAY   finasteride (PROSCAR) 5 MG tablet Take 5 mg by mouth at bedtime.   gabapentin (NEURONTIN) 300 MG capsule Take 300 mg by mouth 3 (three) times daily.   LINZESS 145 MCG CAPS capsule Take 145 mcg by mouth every morning.   magnesium oxide (MAG-OX) 400 (240 Mg) MG tablet TAKE 1 TABLET BY MOUTH EVERY DAY   metoCLOPramide (REGLAN) 5 MG tablet Take 5 mg by mouth 3 (three) times daily before meals.   midodrine (PROAMATINE) 5 MG tablet Take 3 tablets (15 mg total) by mouth 3 (three) times daily.   Multiple Vitamin (MULTIVITAMIN WITH MINERALS) TABS tablet Take 1 tablet by mouth daily.   OXYGEN Inhale 2 L into the lungs continuous.   pantoprazole (PROTONIX) 40 MG tablet Take 1 tablet (40 mg total) by mouth every morning.   Riociguat 2 MG TABS Take 2 mg by mouth in the morning, at noon, and at bedtime.   Tiotropium Bromide-Olodaterol (STIOLTO RESPIMAT) 2.5-2.5 MCG/ACT AERS Inhale 2 puffs into the lungs daily.   torsemide (DEMADEX) 20 MG tablet Take 20 mg by mouth daily.   [DISCONTINUED] LINZESS 72 MCG capsule Take 72 mcg by mouth every morning.    Allergies:   Other, Sunflower oil, and Sulfa antibiotics   Social History   Socioeconomic History   Marital status: Married    Spouse name: Joann   Number of children: 2   Years of education: 14   Highest education level: Not on file  Occupational History   Occupation: postal service,[employer] there,school crossing guard. Stopped working in  2022  Tobacco Use   Smoking status: Former    Current  packs/day: 0.00    Average packs/day: 2.0 packs/day for 52.0 years (104.0 ttl pk-yrs)    Types: Cigarettes    Start date: 64    Quit date: 08/19/2006    Years since quitting: 16.5   Smokeless tobacco: Never   Tobacco comments:    Former smoke 03/15/22  Vaping Use   Vaping status: Never Used  Substance and Sexual Activity   Alcohol use: Not Currently    Alcohol/week: 6.0 standard drinks of alcohol    Types: 6 Shots of liquor per week    Comment: stopped in 2018   Drug use: No   Sexual activity: Not Currently  Other Topics Concern   Not on file  Social History Narrative   Not on file  Social Determinants of Health   Financial Resource Strain: Not on file  Food Insecurity: Patient Declined (07/18/2022)   Hunger Vital Sign    Worried About Running Out of Food in the Last Year: Patient declined    Ran Out of Food in the Last Year: Patient declined  Transportation Needs: No Transportation Needs (07/18/2022)   PRAPARE - Administrator, Civil Service (Medical): No    Lack of Transportation (Non-Medical): No  Physical Activity: Not on file  Stress: No Stress Concern Present (05/09/2021)   [employer] of Occupational Health - Occupational Stress Questionnaire    Feeling of Stress : Only a little  Social Connections: Not on file     Family History:  The patient's family history includes Heart attack (age of onset: 71) in his brother.   ROS:   Please see the history of present illness.    ROS All other systems reviewed and are negative.      No data to display             PHYSICAL EXAM:   VS:  BP (!) 100/50 (BP Location: Right Arm, Patient Position: Sitting)   Pulse 78   Ht 6\' 3"  (1.905 m)   Wt 177 lb (80.3 kg)   SpO2 96%   BMI 22.12 kg/m    GEN: Well nourished, well developed, in no acute distress  HEENT: normal  Neck: no JVD, carotid bruits, or masses Cardiac: RRR; no murmurs, rubs, or gallops,no edema.  Intact distal pulses bilaterally.   Respiratory:  clear to auscultation bilaterally, normal work of breathing GI: soft, nontender, nondistended, + BS MS: no deformity or atrophy  Skin: warm and dry, no rash Neuro:  Alert and Oriented x 3, Strength and sensation are intact Psych: euthymic mood, full affect  Wt Readings from Last 3 Encounters:  03/07/23 177 lb (80.3 kg)  03/07/23 181 lb (82.1 kg)  02/25/23 188 lb 9.6 oz (85.5 kg)      Studies/Labs Reviewed:   HST 2022  Recent Labs: 07/19/2022: ALT 17; Magnesium 1.9 09/23/2022: Hemoglobin 11.4; Platelets 202 02/25/2023: B Natriuretic Peptide 134.1; BUN 28; Creatinine, Ser 1.67; Potassium 3.8; Sodium 135     ASSESSMENT:    1. OSA (obstructive sleep apnea)      PLAN:  In order of problems listed above:  OSA -HST 2022 showed moderate obstructive sleep apnea with an AHI 27.5/h and nocturnal hypoxemia.  A CPAP titration was recommended but the patient never followed through with this as he did not want to try CPAP. -He is now referred by advanced heart failure for sleep medicine consultation to discuss treatment of his obstructive sleep apnea.  The patient is interested in the inspire device. -I discussed the treatment options with the patient.  He wants the inspire device but I told him that insurance would not pay for this unless he has had a trial of CPAP therapy for 6 months. -The only option at this point would be to undergo a CPAP titration and then if he does not tolerate CPAP then would refer after 6 months to see Dr. Jenne Pane for possible inspire device. -after discussing the Inspire device with him he did not realize it was a surgical procedure and he is not interested in any surgery -I recommended that he proceed with the CPAP titration given all his comorbiditis and severe RHF.  He is not interested at this time.   Time Spent: 20 minutes total time of encounter, including 15  minutes spent in face-to-face patient care on the date of this encounter. This time  includes coordination of care and counseling regarding above mentioned problem list. Remainder of non-face-to-face time involved reviewing chart documents/testing relevant to the patient encounter and documentation in the medical record. I have independently reviewed documentation from referring provider  Medication Adjustments/Labs and Tests Ordered: Current medicines are reviewed at length with the patient today.  Concerns regarding medicines are outlined above.  Medication changes, Labs and Tests ordered today are listed in the Patient Instructions below.  There are no Patient Instructions on file for this visit.   Signed, Armanda Magic, MD  03/07/2023 4:18 PM    [facility] [address] [place], [place], [place]  [address] Phone: [phone]; Fax: [phone]

## 2023-03-07 NOTE — Assessment & Plan Note (Signed)
Euvolemic on exam. Follow up with cardiology as scheduled

## 2023-03-07 NOTE — Assessment & Plan Note (Signed)
Admission 7/4-7/5 for sepsis secondary to pneumonia. He had a dense consolidation in the LLL on CT imaging. He has completed levaquin course. Clinically improved. Plan to repeat CT chest in 6-8 weeks to ensure resolution. Target mucociliary clearance therapies. He is up to date on pneumovax. This is his second bout of pna this year. Will obtain swallow study to evaluate for aspiration risk. He does have a hiatal hernia which could be contributing to GERD/recurrent aspiration. He's on PPI - will refill today. Understands risks of long term use. Aspiration and GERD precautions reviewed.  Patient Instructions  Continue Albuterol inhaler 2 puffs or 3 mL neb every 6 hours as needed for shortness of breath or wheezing. Notify if symptoms persist despite rescue inhaler/neb use. Use flutter valve 10 x after neb treatments for congestion/cough Continue Stiolto 2 puffs Twice daily  Continue supplemental oxygen 2 lpm for goal oxygen >88-90% Use mucinex 600 mg Twice daily as needed for cough/congestion  Swallow study ordered  Repeat CT chest in 6-8 weeks    Follow up in 8 weeks with Dr. Vassie Loll (1sT) or Katie Bradey Luzier,NP to review CT results. If symptoms do not improve or worsen, please contact office for sooner follow up or seek emergency care.

## 2023-03-13 ENCOUNTER — Other Ambulatory Visit: Payer: Self-pay

## 2023-03-13 ENCOUNTER — Inpatient Hospital Stay (HOSPITAL_COMMUNITY)
Admission: EM | Admit: 2023-03-13 | Discharge: 2023-03-16 | DRG: 871 | Disposition: A | Discharge status: Home / Self Care | Payer: Medicare Other | Attending: Internal Medicine | Admitting: Internal Medicine

## 2023-03-13 ENCOUNTER — Emergency Department (HOSPITAL_COMMUNITY): Payer: Medicare Other

## 2023-03-13 DIAGNOSIS — Z7901 Long term (current) use of anticoagulants: Secondary | ICD-10-CM | POA: Diagnosis not present

## 2023-03-13 DIAGNOSIS — R0902 Hypoxemia: Secondary | ICD-10-CM | POA: Diagnosis not present

## 2023-03-13 DIAGNOSIS — Z9981 Dependence on supplemental oxygen: Secondary | ICD-10-CM | POA: Diagnosis not present

## 2023-03-13 DIAGNOSIS — M25519 Pain in unspecified shoulder: Secondary | ICD-10-CM | POA: Diagnosis not present

## 2023-03-13 DIAGNOSIS — J441 Chronic obstructive pulmonary disease with (acute) exacerbation: Secondary | ICD-10-CM | POA: Diagnosis present

## 2023-03-13 DIAGNOSIS — Z882 Allergy status to sulfonamides status: Secondary | ICD-10-CM | POA: Diagnosis not present

## 2023-03-13 DIAGNOSIS — I2781 Cor pulmonale (chronic): Secondary | ICD-10-CM | POA: Diagnosis present

## 2023-03-13 DIAGNOSIS — Z8701 Personal history of pneumonia (recurrent): Secondary | ICD-10-CM

## 2023-03-13 DIAGNOSIS — K219 Gastro-esophageal reflux disease without esophagitis: Secondary | ICD-10-CM | POA: Diagnosis present

## 2023-03-13 DIAGNOSIS — J90 Pleural effusion, not elsewhere classified: Secondary | ICD-10-CM | POA: Diagnosis not present

## 2023-03-13 DIAGNOSIS — J440 Chronic obstructive pulmonary disease with acute lower respiratory infection: Secondary | ICD-10-CM | POA: Diagnosis present

## 2023-03-13 DIAGNOSIS — A419 Sepsis, unspecified organism: Secondary | ICD-10-CM | POA: Diagnosis not present

## 2023-03-13 DIAGNOSIS — J9611 Chronic respiratory failure with hypoxia: Secondary | ICD-10-CM | POA: Diagnosis present

## 2023-03-13 DIAGNOSIS — K449 Diaphragmatic hernia without obstruction or gangrene: Secondary | ICD-10-CM | POA: Diagnosis present

## 2023-03-13 DIAGNOSIS — R0689 Other abnormalities of breathing: Secondary | ICD-10-CM | POA: Diagnosis not present

## 2023-03-13 DIAGNOSIS — R000 Tachycardia, unspecified: Secondary | ICD-10-CM | POA: Diagnosis not present

## 2023-03-13 DIAGNOSIS — I951 Orthostatic hypotension: Secondary | ICD-10-CM | POA: Diagnosis present

## 2023-03-13 DIAGNOSIS — Z87891 Personal history of nicotine dependence: Secondary | ICD-10-CM | POA: Diagnosis not present

## 2023-03-13 DIAGNOSIS — J189 Pneumonia, unspecified organism: Secondary | ICD-10-CM | POA: Diagnosis not present

## 2023-03-13 DIAGNOSIS — I5032 Chronic diastolic (congestive) heart failure: Secondary | ICD-10-CM | POA: Diagnosis present

## 2023-03-13 DIAGNOSIS — E785 Hyperlipidemia, unspecified: Secondary | ICD-10-CM | POA: Diagnosis present

## 2023-03-13 DIAGNOSIS — Z79899 Other long term (current) drug therapy: Secondary | ICD-10-CM | POA: Diagnosis not present

## 2023-03-13 DIAGNOSIS — I110 Hypertensive heart disease with heart failure: Secondary | ICD-10-CM | POA: Diagnosis present

## 2023-03-13 DIAGNOSIS — M109 Gout, unspecified: Secondary | ICD-10-CM | POA: Diagnosis present

## 2023-03-13 DIAGNOSIS — N138 Other obstructive and reflux uropathy: Secondary | ICD-10-CM | POA: Diagnosis present

## 2023-03-13 DIAGNOSIS — Z9049 Acquired absence of other specified parts of digestive tract: Secondary | ICD-10-CM

## 2023-03-13 DIAGNOSIS — Z96652 Presence of left artificial knee joint: Secondary | ICD-10-CM | POA: Diagnosis present

## 2023-03-13 DIAGNOSIS — Z86711 Personal history of pulmonary embolism: Secondary | ICD-10-CM

## 2023-03-13 DIAGNOSIS — Z8249 Family history of ischemic heart disease and other diseases of the circulatory system: Secondary | ICD-10-CM

## 2023-03-13 DIAGNOSIS — J159 Unspecified bacterial pneumonia: Secondary | ICD-10-CM | POA: Diagnosis present

## 2023-03-13 DIAGNOSIS — R6520 Severe sepsis without septic shock: Secondary | ICD-10-CM | POA: Diagnosis present

## 2023-03-13 DIAGNOSIS — E872 Acidosis, unspecified: Secondary | ICD-10-CM | POA: Diagnosis present

## 2023-03-13 DIAGNOSIS — Z8672 Personal history of thrombophlebitis: Secondary | ICD-10-CM

## 2023-03-13 DIAGNOSIS — I252 Old myocardial infarction: Secondary | ICD-10-CM

## 2023-03-13 DIAGNOSIS — J439 Emphysema, unspecified: Secondary | ICD-10-CM | POA: Diagnosis not present

## 2023-03-13 DIAGNOSIS — J690 Pneumonitis due to inhalation of food and vomit: Secondary | ICD-10-CM | POA: Diagnosis present

## 2023-03-13 DIAGNOSIS — K2289 Other specified disease of esophagus: Secondary | ICD-10-CM | POA: Diagnosis not present

## 2023-03-13 DIAGNOSIS — Z1152 Encounter for screening for COVID-19: Secondary | ICD-10-CM | POA: Diagnosis not present

## 2023-03-13 DIAGNOSIS — I2729 Other secondary pulmonary hypertension: Secondary | ICD-10-CM | POA: Diagnosis present

## 2023-03-13 DIAGNOSIS — N401 Enlarged prostate with lower urinary tract symptoms: Secondary | ICD-10-CM | POA: Diagnosis present

## 2023-03-13 DIAGNOSIS — A403 Sepsis due to Streptococcus pneumoniae: Secondary | ICD-10-CM | POA: Diagnosis not present

## 2023-03-13 DIAGNOSIS — R531 Weakness: Secondary | ICD-10-CM | POA: Diagnosis not present

## 2023-03-13 LAB — COMPREHENSIVE METABOLIC PANEL
ALT: 20 U/L (ref 0–44)
AST: 39 U/L (ref 15–41)
Albumin: 3.7 g/dL (ref 3.5–5.0)
Alkaline Phosphatase: 85 U/L (ref 38–126)
Anion gap: 14 (ref 5–15)
BUN: 20 mg/dL (ref 8–23)
CO2: 31 mmol/L (ref 22–32)
Calcium: 9.4 mg/dL (ref 8.9–10.3)
Chloride: 94 mmol/L — ABNORMAL LOW (ref 98–111)
Creatinine, Ser: 1.52 mg/dL — ABNORMAL HIGH (ref 0.61–1.24)
GFR, Estimated: 45 mL/min — ABNORMAL LOW (ref >60)
Glucose, Bld: 100 mg/dL — ABNORMAL HIGH (ref 70–99)
Potassium: 4 mmol/L (ref 3.5–5.1)
Sodium: 139 mmol/L (ref 135–145)
Total Bilirubin: 1.3 mg/dL — ABNORMAL HIGH (ref 0.3–1.2)
Total Protein: 7.2 g/dL (ref 6.5–8.1)

## 2023-03-13 LAB — URINALYSIS, ROUTINE W REFLEX MICROSCOPIC
Bacteria, UA: NONE SEEN
Bilirubin Urine: NEGATIVE
Glucose, UA: >=500 mg/dL — AB
Hgb urine dipstick: NEGATIVE
Ketones, ur: NEGATIVE mg/dL
Nitrite: NEGATIVE
Protein, ur: NEGATIVE mg/dL
Specific Gravity, Urine: 1.021 (ref 1.005–1.030)
pH: 8 (ref 5.0–8.0)

## 2023-03-13 LAB — CBC WITH DIFFERENTIAL/PLATELET
Abs Immature Granulocytes: 0.05 10*3/uL (ref 0.00–0.07)
Basophils Absolute: 0 10*3/uL (ref 0.0–0.1)
Basophils Relative: 0 %
Eosinophils Absolute: 0 10*3/uL (ref 0.0–0.5)
Eosinophils Relative: 0 %
HCT: 42.4 % (ref 39.0–52.0)
Hemoglobin: 13.1 g/dL (ref 13.0–17.0)
Immature Granulocytes: 1 %
Lymphocytes Relative: 4 %
Lymphs Abs: 0.3 10*3/uL — ABNORMAL LOW (ref 0.7–4.0)
MCH: 27.1 pg (ref 26.0–34.0)
MCHC: 30.9 g/dL (ref 30.0–36.0)
MCV: 87.6 fL (ref 80.0–100.0)
Monocytes Absolute: 0.5 10*3/uL (ref 0.1–1.0)
Monocytes Relative: 6 %
Neutro Abs: 7.5 10*3/uL (ref 1.7–7.7)
Neutrophils Relative %: 89 %
Platelets: 151 10*3/uL (ref 150–400)
RBC: 4.84 MIL/uL (ref 4.22–5.81)
RDW: 15.9 % — ABNORMAL HIGH (ref 11.5–15.5)
WBC: 8.4 10*3/uL (ref 4.0–10.5)
nRBC: 0 % (ref 0.0–0.2)

## 2023-03-13 LAB — HEMOGLOBIN A1C
Hgb A1c MFr Bld: 5.7 % — ABNORMAL HIGH (ref 4.8–5.6)
Mean Plasma Glucose: 116.89 mg/dL

## 2023-03-13 LAB — I-STAT CG4 LACTIC ACID, ED
Lactic Acid, Venous: 2.5 mmol/L (ref 0.5–1.9)
Lactic Acid, Venous: 1.4 mmol/L (ref 0.5–1.9)
Lactic Acid, Venous: 1.6 mmol/L (ref 0.5–1.9)

## 2023-03-13 LAB — TROPONIN I (HIGH SENSITIVITY): Troponin I (High Sensitivity): 36 ng/L — ABNORMAL HIGH (ref <18)

## 2023-03-13 LAB — CBG MONITORING, ED: Glucose-Capillary: 94 mg/dL (ref 70–99)

## 2023-03-13 LAB — STREP PNEUMONIAE URINARY ANTIGEN: Strep Pneumo Urinary Antigen: NEGATIVE

## 2023-03-13 LAB — RESP PANEL BY RT-PCR (RSV, FLU A&B, COVID)  RVPGX2
Influenza A by PCR: NEGATIVE
Influenza B by PCR: NEGATIVE
Resp Syncytial Virus by PCR: NEGATIVE
SARS Coronavirus 2 by RT PCR: NEGATIVE

## 2023-03-13 LAB — GLUCOSE, CAPILLARY: Glucose-Capillary: 99 mg/dL (ref 70–99)

## 2023-03-13 LAB — BRAIN NATRIURETIC PEPTIDE: B Natriuretic Peptide: 173.5 pg/mL — ABNORMAL HIGH (ref 0.0–100.0)

## 2023-03-13 MED ORDER — IPRATROPIUM-ALBUTEROL 0.5-2.5 (3) MG/3ML IN SOLN
3.0000 mL | Freq: Four times a day (QID) | RESPIRATORY_TRACT | Status: DC
  Administered 2023-03-13: 3 mL via RESPIRATORY_TRACT
  Filled 2023-03-13 (×3): qty 3

## 2023-03-13 MED ORDER — PANTOPRAZOLE SODIUM 40 MG PO TBEC
40.0000 mg | DELAYED_RELEASE_TABLET | Freq: Every morning | ORAL | Status: DC
  Administered 2023-03-14: 40 mg via ORAL
  Filled 2023-03-13: qty 1

## 2023-03-13 MED ORDER — PREDNISONE 20 MG PO TABS
40.0000 mg | ORAL_TABLET | Freq: Every day | ORAL | Status: DC
  Administered 2023-03-13 – 2023-03-14 (×2): 40 mg via ORAL
  Filled 2023-03-13 (×2): qty 2

## 2023-03-13 MED ORDER — ATORVASTATIN CALCIUM 40 MG PO TABS
40.0000 mg | ORAL_TABLET | Freq: Every day | ORAL | Status: DC
  Administered 2023-03-13 – 2023-03-16 (×4): 40 mg via ORAL
  Filled 2023-03-13 (×4): qty 1

## 2023-03-13 MED ORDER — LACTATED RINGERS IV BOLUS (SEPSIS)
500.0000 mL | Freq: Once | INTRAVENOUS | Status: AC
  Administered 2023-03-13: 500 mL via INTRAVENOUS

## 2023-03-13 MED ORDER — ARFORMOTEROL TARTRATE 15 MCG/2ML IN NEBU
15.0000 ug | INHALATION_SOLUTION | Freq: Two times a day (BID) | RESPIRATORY_TRACT | Status: DC
  Administered 2023-03-13 – 2023-03-15 (×4): 15 ug via RESPIRATORY_TRACT
  Filled 2023-03-13 (×6): qty 2

## 2023-03-13 MED ORDER — VANCOMYCIN HCL IN DEXTROSE 1-5 GM/200ML-% IV SOLN
1000.0000 mg | Freq: Once | INTRAVENOUS | Status: AC
  Administered 2023-03-13: 1000 mg via INTRAVENOUS
  Filled 2023-03-13: qty 200

## 2023-03-13 MED ORDER — ACETAMINOPHEN 500 MG PO TABS
500.0000 mg | ORAL_TABLET | Freq: Once | ORAL | Status: AC
  Administered 2023-03-13: 500 mg via ORAL
  Filled 2023-03-13: qty 1

## 2023-03-13 MED ORDER — UMECLIDINIUM BROMIDE 62.5 MCG/ACT IN AEPB
1.0000 | INHALATION_SPRAY | Freq: Every day | RESPIRATORY_TRACT | Status: DC
  Administered 2023-03-15: 1 via RESPIRATORY_TRACT
  Filled 2023-03-13: qty 7

## 2023-03-13 MED ORDER — GUAIFENESIN ER 600 MG PO TB12
1200.0000 mg | ORAL_TABLET | Freq: Two times a day (BID) | ORAL | Status: DC
  Administered 2023-03-13 – 2023-03-16 (×6): 1200 mg via ORAL
  Filled 2023-03-13 (×6): qty 2

## 2023-03-13 MED ORDER — ALBUTEROL SULFATE (2.5 MG/3ML) 0.083% IN NEBU: 3.0000 mL | INHALATION_SOLUTION | RESPIRATORY_TRACT | Status: DC | PRN

## 2023-03-13 MED ORDER — MIDODRINE HCL 5 MG PO TABS
15.0000 mg | ORAL_TABLET | Freq: Three times a day (TID) | ORAL | Status: DC
  Administered 2023-03-13 – 2023-03-16 (×8): 15 mg via ORAL
  Filled 2023-03-13 (×8): qty 3

## 2023-03-13 MED ORDER — METRONIDAZOLE 500 MG/100ML IV SOLN
500.0000 mg | Freq: Once | INTRAVENOUS | Status: AC
  Administered 2023-03-13: 500 mg via INTRAVENOUS
  Filled 2023-03-13: qty 100

## 2023-03-13 MED ORDER — SODIUM CHLORIDE 0.9 % IV SOLN
2.0000 g | INTRAVENOUS | Status: DC
  Administered 2023-03-13: 2 g via INTRAVENOUS
  Filled 2023-03-13: qty 20

## 2023-03-13 MED ORDER — ADULT MULTIVITAMIN W/MINERALS CH
1.0000 | ORAL_TABLET | Freq: Every day | ORAL | Status: DC
  Administered 2023-03-13 – 2023-03-16 (×4): 1 via ORAL
  Filled 2023-03-13 (×4): qty 1

## 2023-03-13 MED ORDER — RISAQUAD PO CAPS
1.0000 | ORAL_CAPSULE | Freq: Every day | ORAL | Status: DC
  Administered 2023-03-13 – 2023-03-16 (×4): 1 via ORAL
  Filled 2023-03-13 (×5): qty 1

## 2023-03-13 MED ORDER — ALLOPURINOL 100 MG PO TABS
100.0000 mg | ORAL_TABLET | Freq: Every morning | ORAL | Status: DC
  Administered 2023-03-14 – 2023-03-16 (×3): 100 mg via ORAL
  Filled 2023-03-13 (×3): qty 1

## 2023-03-13 MED ORDER — IOHEXOL 350 MG/ML SOLN
60.0000 mL | Freq: Once | INTRAVENOUS | Status: AC | PRN
  Administered 2023-03-13: 60 mL via INTRAVENOUS

## 2023-03-13 MED ORDER — LIDOCAINE-EPINEPHRINE (PF) 2 %-1:200000 IJ SOLN
10.0000 mL | Freq: Once | INTRAMUSCULAR | Status: DC
  Filled 2023-03-13: qty 20

## 2023-03-13 MED ORDER — GABAPENTIN 300 MG PO CAPS
300.0000 mg | ORAL_CAPSULE | Freq: Three times a day (TID) | ORAL | Status: DC
  Administered 2023-03-13 – 2023-03-16 (×8): 300 mg via ORAL
  Filled 2023-03-13 (×8): qty 1

## 2023-03-13 MED ORDER — LACTATED RINGERS IV BOLUS (SEPSIS)
1000.0000 mL | Freq: Once | INTRAVENOUS | Status: AC
  Administered 2023-03-13: 1000 mL via INTRAVENOUS

## 2023-03-13 MED ORDER — SODIUM CHLORIDE 0.9 % IV SOLN
2.0000 g | Freq: Once | INTRAVENOUS | Status: AC
  Administered 2023-03-13: 2 g via INTRAVENOUS
  Filled 2023-03-13: qty 12.5

## 2023-03-13 MED ORDER — AZITHROMYCIN 500 MG PO TABS
500.0000 mg | ORAL_TABLET | Freq: Every day | ORAL | Status: DC
  Administered 2023-03-13 – 2023-03-16 (×4): 500 mg via ORAL
  Filled 2023-03-13 (×4): qty 1

## 2023-03-13 MED ORDER — RIOCIGUAT 2 MG PO TABS
2.0000 mg | ORAL_TABLET | Freq: Three times a day (TID) | ORAL | Status: DC
  Administered 2023-03-14 – 2023-03-16 (×7): 2 mg via ORAL
  Filled 2023-03-13 (×5): qty 1

## 2023-03-13 MED ORDER — APIXABAN 5 MG PO TABS
5.0000 mg | ORAL_TABLET | Freq: Two times a day (BID) | ORAL | Status: DC
  Administered 2023-03-13 – 2023-03-16 (×6): 5 mg via ORAL
  Filled 2023-03-13 (×6): qty 1

## 2023-03-13 MED ORDER — LACTATED RINGERS IV SOLN: INTRAVENOUS | Status: AC

## 2023-03-13 MED ORDER — INSULIN ASPART 100 UNIT/ML IJ SOLN: 0.0000 [IU] | Freq: Three times a day (TID) | INTRAMUSCULAR | Status: DC

## 2023-03-13 MED ORDER — LINACLOTIDE 145 MCG PO CAPS
145.0000 ug | ORAL_CAPSULE | Freq: Every morning | ORAL | Status: DC
  Administered 2023-03-14 – 2023-03-16 (×3): 145 ug via ORAL
  Filled 2023-03-13 (×3): qty 1

## 2023-03-13 MED ORDER — METOCLOPRAMIDE HCL 10 MG PO TABS
5.0000 mg | ORAL_TABLET | Freq: Three times a day (TID) | ORAL | Status: DC
  Administered 2023-03-13 – 2023-03-16 (×7): 5 mg via ORAL
  Filled 2023-03-13 (×7): qty 1

## 2023-03-13 MED ORDER — MAGNESIUM OXIDE -MG SUPPLEMENT 400 (240 MG) MG PO TABS
400.0000 mg | ORAL_TABLET | Freq: Every day | ORAL | Status: DC
  Administered 2023-03-13 – 2023-03-16 (×4): 400 mg via ORAL
  Filled 2023-03-13 (×4): qty 1

## 2023-03-13 MED ORDER — FINASTERIDE 5 MG PO TABS
5.0000 mg | ORAL_TABLET | Freq: Every day | ORAL | Status: DC
  Administered 2023-03-13 – 2023-03-15 (×3): 5 mg via ORAL
  Filled 2023-03-13 (×3): qty 1

## 2023-03-13 MED ORDER — VANCOMYCIN HCL 750 MG/150ML IV SOLN
750.0000 mg | Freq: Once | INTRAVENOUS | Status: AC
  Administered 2023-03-13: 750 mg via INTRAVENOUS
  Filled 2023-03-13: qty 150

## 2023-03-13 NOTE — H&P (Signed)
History and Physical    [name]:[id number] DOB: [birth year] DOA: 03/13/2023  PCP: [name], DO (Confirm with patient/family/NH records and if not entered, this has to be entered at [facility] point of entry) Patient coming from: Home  I have personally briefly reviewed patient's old medical records in [facility]  Chief Complaint: Cough, wheezing, feeling weak  HPI: Joshua [name] is a 87 y.o. male with medical history significant of COPD Gold stage III, restrictive lung disease, pulmonary hypertension, cor pulmonale, chronic hypoxic respiratory failure on 2 L continuously,, chronic HFpEF, chronic orthostatic hypotension on midodrine, IIDM, GERD, BPH with chronic bladder outlet obstruction and self cathetering 3 times daily, on Eliquis, presented with worsening of cough, wheezing, generalized weakness.  Patient traveled to [place] 3 weeks ago and was feeling sick x 1 day and went to a local hospital, when he was diagnosed with CAP and COPD exacerbation, was treated with antibiotics and 7 days of p.o. antibiotics plus tapering of prednisone for 10 days.  Last night, patient started to have increasing of coughing, dry, denies any fever chills denied any chest pains.  This morning, patient became very weak, with increasing wheezing and left-sided shoulder pain and came to the hospital.  He denies any diarrhea, no urinary problems no rash or any injury.  At baseline, patient has chronic hypotension on midodrine, baseline SBP runs 95-100  ED Course: Fever 103.1, borderline tachycardia, blood pressure 95/51, and patient received 2.5 L IV bolus, SBP increased to 99.  Blood work showed creatinine 1.5, WBC 8.4, hemoglobin 13.1.  CT chest showed right lower lobe pneumonia.  Patient was given vancomycin and cefepime in the ED  Review of Systems: As per HPI otherwise 14 point review of systems negative.   Past Medical History:  Diagnosis Date   Arthritis    BPH (benign prostatic  hyperplasia)    CHF (congestive heart failure) (HCC)    Chronic hiccups    COPD (chronic obstructive pulmonary disease) (HCC)    Folliculitis    posterior scalp per office visit note of Dr Selena Batten 07/20/2014     GERD (gastroesophageal reflux disease)    Gout    Hypertension    Hypotension    NSTEMI (non-ST elevated myocardial infarction) (HCC) 03/19/2022   PE (pulmonary thromboembolism) (HCC)    Phlebitis    right arm  at least 20 years ago    Pneumonia    hx of pneumonia as a child    Pulmonary embolism (HCC) 03/17/2022   Pulmonary hypertension (HCC)    Severe sepsis (HCC) 03/16/2022   Sleep apnea     Past Surgical History:  Procedure Laterality Date   BIOPSY  11/03/2020   Procedure: BIOPSY;  Surgeon: Jeani Hawking, MD;  Location: [facility];  Service: Endoscopy;;   BIOPSY  07/06/2021   Procedure: BIOPSY;  Surgeon: Jeani Hawking, MD;  Location: [facility];  Service: Endoscopy;;   bone removed from little toe right foot      CHOLECYSTECTOMY     ESOPHAGOGASTRODUODENOSCOPY (EGD) WITH PROPOFOL N/A 11/03/2020   Procedure: ESOPHAGOGASTRODUODENOSCOPY (EGD) WITH PROPOFOL;  Surgeon: Jeani Hawking, MD;  Location: [facility];  Service: Endoscopy;  Laterality: N/A;   ESOPHAGOGASTRODUODENOSCOPY (EGD) WITH PROPOFOL N/A 07/06/2021   Procedure: ESOPHAGOGASTRODUODENOSCOPY (EGD) WITH PROPOFOL;  Surgeon: Jeani Hawking, MD;  Location: [facility];  Service: Endoscopy;  Laterality: N/A;   pilonidal cyst removal      RIGHT HEART CATH N/A 12/13/2020   Procedure: RIGHT HEART CATH;  Surgeon: Shirlee Latch,  Eliot Ford, MD;  Location: [facility] INVAS[facility] CV LAB;  Service: Cardiovascular;  Laterality: N/A;   RIGHT/LEFT HEART CATH AND CORONARY ANGIOGRAPHY N/A 03/21/2022   Procedure: RIGHT/LEFT HEART CATH AND CORONARY ANGIOGRAPHY;  Surgeon: Laurey Morale, MD;  [facility] CV [facility];  Service: Cardiovascular;  Laterality: N/A;   TOTAL KNEE ARTHROPLASTY Left 09/13/2014   Procedure: LEFT TOTAL KNEE ARTHROPLASTY;   Surgeon: Shelda Pal, MD;  Location: [facility];  Service: Orthopedics;  Laterality: Left;     reports that he quit smoking about 16 years ago. His smoking use included cigarettes. He started smoking about 68 years ago. He has a 104 pack-year smoking history. He has never used smokeless tobacco. He reports that he does not currently use alcohol after a past usage of about 6.0 standard drinks of alcohol per week. He reports that he does not use drugs.  Allergies  Allergen Reactions   Other Swelling    Beer- Swelling    Sunflower Oil Swelling   Sulfa Antibiotics Rash    Family History  Problem Relation Age of Onset   Heart attack Brother 12     Prior to Admission medications   Medication Sig Start Date End Date Taking? Authorizing Provider  Acidophilus Lactobacillus CAPS Take 1 capsule by mouth daily. 02/22/23 03/24/23  [provider]  albuterol (VENTOLIN HFA) 108 (90 Base) MCG/ACT inhaler Inhale 1 puff into the lungs every 4 (four) hours as needed for shortness of breath.    [provider]  allopurinol (ZYLOPRIM) 100 MG tablet Take 100 mg by mouth every morning.    [provider]  atorvastatin (LIPITOR) 40 MG tablet TAKE 1 TABLET BY MOUTH EVERY DAY 10/02/22   [place], [place], FNP  Cinnamon 500 MG capsule Take 500 mg by mouth every morning.    [provider]  dapagliflozin propanediol (FARXIGA) 10 MG TABS tablet Take 1 tablet (10 mg total) by mouth daily before breakfast. 02/25/23   Milford, Anderson Malta, FNP  ELIQUIS 5 MG TABS tablet TAKE 1 TABLET BY MOUTH TWICE A DAY Patient taking differently: Take 5 mg by mouth 2 (two) times daily. 02/18/22   Laurey Morale, MD  finasteride (PROSCAR) 5 MG tablet Take 5 mg by mouth at bedtime.    [provider]  gabapentin (NEURONTIN) 300 MG capsule Take 300 mg by mouth 3 (three) times daily. 04/04/22   [provider]  LINZESS 145 MCG CAPS capsule Take 145 mcg by mouth every morning. 12/02/22    [provider]  magnesium oxide (MAG-OX) 400 (240 Mg) MG tablet TAKE 1 TABLET BY MOUTH EVERY DAY 12/30/22   Laurey Morale, MD  metoCLOPramide (REGLAN) 5 MG tablet Take 5 mg by mouth 3 (three) times daily before meals.    [provider]  midodrine (PROAMATINE) 5 MG tablet Take 3 tablets (15 mg total) by mouth 3 (three) times daily. 07/30/22   Laurey Morale, MD  Multiple Vitamin (MULTIVITAMIN WITH MINERALS) TABS tablet Take 1 tablet by mouth daily. 05/01/21   Marguerita Merles Latif, DO  OXYGEN Inhale 2 L into the lungs continuous.    [provider]  pantoprazole (PROTONIX) 40 MG tablet Take 1 tablet (40 mg total) by mouth every morning. 03/07/23   Cobb, Ruby Cola, NP  Riociguat 2 MG TABS Take 2 mg by mouth in the morning, at noon, and at bedtime. 08/30/22   Laurey Morale, MD  Tiotropium Bromide-Olodaterol (STIOLTO RESPIMAT) 2.5-2.5 MCG/ACT AERS Inhale 2  puffs into the lungs daily. 02/10/23   Oretha Milch, MD  torsemide (DEMADEX) 20 MG tablet Take 20 mg by mouth daily.    [provider]    Physical Exam: Vitals:   03/13/23 1530 03/13/23 1545 03/13/23 1600 03/13/23 1615  BP: (!) 95/51 (!) 85/49 (!) 88/43 (!) 94/51  Pulse: 88 84  81  Resp: 16 19 (!) 29 16  Temp:      TempSrc:      SpO2: 100% 100% 99% 100%    Constitutional: NAD, calm, comfortable Vitals:   03/13/23 1530 03/13/23 1545 03/13/23 1600 03/13/23 1615  BP: (!) 95/51 (!) 85/49 (!) 88/43 (!) 94/51  Pulse: 88 84  81  Resp: 16 19 (!) 29 16  Temp:      TempSrc:      SpO2: 100% 100% 99% 100%   Eyes: PERRL, lids and conjunctivae normal ENMT: Mucous membranes are moist. Posterior pharynx clear of any exudate or lesions.Normal dentition.  Neck: normal, supple, no masses, no thyromegaly Respiratory: clear to auscultation bilaterally, scattered wheezing, fine crackles on right lower field. Normal respiratory effort. No accessory muscle use.  Cardiovascular: Regular rate and rhythm, no  murmurs / rubs / gallops. No extremity edema. 2+ pedal pulses. No carotid bruits.  Abdomen: no tenderness, no masses palpated. No hepatosplenomegaly. Bowel sounds positive.  Musculoskeletal: no clubbing / cyanosis. No joint deformity upper and lower extremities. Good ROM, no contractures. Normal muscle tone.  Skin: no rashes, lesions, ulcers. No induration Neurologic: CN 2-12 grossly intact. Sensation intact, DTR normal. Strength 5/5 in all 4.  Psychiatric: Normal judgment and insight. Alert and oriented x 3. Normal mood.     Labs on Admission: I have personally reviewed following labs and imaging studies  CBC: Recent Labs  Lab 03/13/23 1116  WBC 8.4  NEUTROABS 7.5  HGB 13.1  HCT 42.4  MCV 87.6  PLT 151   Basic Metabolic Panel: Recent Labs  Lab 03/13/23 1116  NA 139  K 4.0  CL 94*  CO2 31  GLUCOSE 100*  BUN 20  CREATININE 1.52*  CALCIUM 9.4   GFR: Estimated Creatinine Clearance: 40.4 mL/min (A) (by C-G formula based on SCr of 1.52 mg/dL (H)). Liver Function Tests: Recent Labs  Lab 03/13/23 1116  AST 39  ALT 20  ALKPHOS 85  BILITOT 1.3*  PROT 7.2  ALBUMIN 3.7   No results for input(s): "LIPASE", "AMYLASE" in the last 168 hours. No results for input(s): "AMMONIA" in the last 168 hours. Coagulation Profile: No results for input(s): "INR", "PROTIME" in the last 168 hours. Cardiac Enzymes: No results for input(s): "CKTOTAL", "CKMB", "CKMBINDEX", "TROPONINI" in the last 168 hours. BNP (last 3 results) No results for input(s): "PROBNP" in the last 8760 hours. HbA1C: No results for input(s): "HGBA1C" in the last 72 hours. CBG: No results for input(s): "GLUCAP" in the last 168 hours. Lipid Profile: No results for input(s): "CHOL", "HDL", "LDLCALC", "TRIG", "CHOLHDL", "LDLDIRECT" in the last 72 hours. Thyroid Function Tests: No results for input(s): "TSH", "T4TOTAL", "FREET4", "T3FREE", "THYROIDAB" in the last 72 hours. Anemia Panel: No results for input(s):  "VITAMINB12", "FOLATE", "FERRITIN", "TIBC", "IRON", "RETICCTPCT" in the last 72 hours. Urine analysis:    Component Value Date/Time   COLORURINE YELLOW 03/13/2023 1552   APPEARANCEUR CLEAR 03/13/2023 1552   LABSPEC 1.021 03/13/2023 1552   PHURINE 8.0 03/13/2023 1552   GLUCOSEU >=500 (A) 03/13/2023 1552   HGBUR NEGATIVE 03/13/2023 1552   BILIRUBINUR NEGATIVE 03/13/2023 1552  KETONESUR NEGATIVE 03/13/2023 1552   PROTEINUR NEGATIVE 03/13/2023 1552   UROBILINOGEN 0.2 09/08/2014 0927   NITRITE NEGATIVE 03/13/2023 1552   LEUKOCYTESUR TRACE (A) 03/13/2023 1552    Radiological Exams on Admission: CT Angio Chest PE W and/or Wo Contrast  Result Date: 03/13/2023 CLINICAL DATA:  Pulmonary embolism (PE) suspected, high prob EXAM: CT ANGIOGRAPHY CHEST WITH CONTRAST TECHNIQUE: Multidetector CT imaging of the chest was performed using the standard protocol during bolus administration of intravenous contrast. Multiplanar CT image reconstructions and MIPs were obtained to evaluate the vascular anatomy. RADIATION DOSE REDUCTION: This exam was performed according to the departmental dose-optimization program which includes automated exposure control, adjustment of the mA and/or kV according to patient size and/or use of iterative reconstruction technique. CONTRAST:  60mL OMNIPAQUE IOHEXOL 350 MG/ML SOLN COMPARISON:  CT scan chest from 03/16/2022. FINDINGS: Cardiovascular: No evidence of embolism to the proximal subsegmental pulmonary artery level. Mild cardiomegaly. No pericardial effusion. No aortic aneurysm. There are coronary artery calcifications, in keeping with coronary artery disease. Mediastinum/Nodes: Visualized thyroid gland appears grossly unremarkable. No solid / cystic mediastinal masses. The esophagus is dilated and filled with fluid/debris, which is nonspecific but most commonly seen with esophageal dysmotility or gastroesophageal reflux disease. There is also mild-to-moderate circumferential  thickening of the lower thoracic esophagus, which can be seen with esophagitis. There is small-to-moderate sliding hiatal hernia. Findings appear grossly similar to the prior study however, correlate clinically to determine the need for additional evaluation with endoscopy. No axillary, mediastinal or hilar lymphadenopathy by size criteria. Lungs/Pleura: The central tracheo-bronchial tree is patent. There are moderate upper lobe predominant emphysematous changes. There are airspace opacities in the right lung lower lobe with associated trace pleural effusion, concerning for pneumonia. There are patchy opacities in the dependent left lung lower lobe as well, which may represent pneumonitis versus atelectasis. No mass or pneumothorax. No left pleural effusion. No suspicious lung nodules. Upper Abdomen: Small-to-moderate hiatal hernia again seen. Surgically absent gallbladder. Visualized upper abdominal viscera within normal limits. Musculoskeletal: The visualized soft tissues of the chest wall are grossly unremarkable. No suspicious osseous lesions. There are mild multilevel degenerative changes in the visualized spine. Review of the MIP images confirms the above findings. IMPRESSION: 1. No evidence of pulmonary embolism to the proximal subsegmental pulmonary artery level. 2. Right lung lower lobe pneumonia with associated trace pleural effusion. 3. Small-to-moderate hiatal hernia with dilated esophagus filled with fluid/debris. There is also mild-to-moderate circumferential thickening of the lower thoracic esophagus, which can be seen with esophagitis. Findings appear grossly similar to the prior study however, correlate clinically to determine the need for additional evaluation with endoscopy. Emphysema (ICD10-J43.9). Electronically Signed   By: Jules Schick M.D.   On: 03/13/2023 14:53    EKG: Independently reviewed.  Chronic RBBB with nonspecific ST changes on multiple leads  Assessment/Plan Active  Problems:   Chronic diastolic CHF (congestive heart failure) (HCC)   CAP (community acquired pneumonia)   Sepsis (HCC)  (please populate well all problems here in Problem List. (For example, if patient is on BP meds at home and you resume or decide to hold them, it is a problem that needs to be her. Same for CAD, COPD, HLD and so on)  Fever RLL bacterial pneumonia -Doubt patient has sepsis, as his baseline SBP 95-100.  And patient has not been taking his midodrine holding today. -Patient received a total of 2.5 L IV bolus in the ED, and patient currently did look euvolemic -Hold off maintenance IV  fluid -Restart midodrine -RLL pneumonia evidenced by new onset of cough, and CTA showed right lower lobe infiltrates. -Start ceftriaxone and azithromycin -Check sputum culture, strep pneumonia antigen -Breathing treatment and other supportive care including incentive spirometry and flutter valve -Other DDx, according to family, patient has low risk of  -.  Will also check UA to rule out UTI  Acute COPD exacerbation -Short course of p.o. steroids -DuoNebs -Continue ICS and LABA  Chronic hypotension -Blood pressure above his baseline, received IV bolus in the ED, will hold off maintenance IV fluid -Hold off diuresis -Continue midodrine  History of chronic HFpEF Pulmonary hypertension Cor pulmonale -Euvolemic, hold off diuresis today  BPH with chronic bladder obstruction -Continue self catheterization, ordered set placed -UA to rule out UTI  IIDM -SSI  Hx of PE -CTA negative for PE -Continue Eliquis    DVT prophylaxis: Eliquis Code Status: Full code Family Communication: Wife at bedside Disposition Plan: Expect less than 2 midnight hospital stay Consults called: None Admission status: PCU obs   Emeline General MD [facility] Pager [phone]  03/13/2023, 4:35 PM

## 2023-03-13 NOTE — ED Provider Notes (Signed)
[place] EMERGENCY DEPARTMENT AT [facility] Provider Note   CSN: [id number] Arrival date & time: 03/13/23  1031     History  Chief Complaint  Patient presents with   Weakness   Shoulder Pain    Left    Joshua [name] is a 87 y.o. male.  The history is provided by the patient and medical records. No language interpreter was used.  Weakness Severity:  Unable to specify Onset quality:  Gradual Relieved by:  Nothing Worsened by:  Nothing Ineffective treatments:  None tried Associated symptoms: cough and shortness of breath   Associated symptoms: no abdominal pain, no aphasia, no chest pain, no diarrhea, no dysuria, no fever, no headaches, no nausea and no vomiting   Shoulder Pain Associated symptoms: fatigue   Associated symptoms: no back pain and no fever        Home Medications Prior to Admission medications   Medication Sig Start Date End Date Taking? Authorizing Provider  Acidophilus Lactobacillus CAPS Take 1 capsule by mouth daily. 02/22/23 03/24/23  [provider]  albuterol (VENTOLIN HFA) 108 (90 Base) MCG/ACT inhaler Inhale 1 puff into the lungs every 4 (four) hours as needed for shortness of breath.    [provider]  allopurinol (ZYLOPRIM) 100 MG tablet Take 100 mg by mouth every morning.    [provider]  atorvastatin (LIPITOR) 40 MG tablet TAKE 1 TABLET BY MOUTH EVERY DAY 10/02/22   Milford, Desha, FNP  Cinnamon 500 MG capsule Take 500 mg by mouth every morning.    [provider]  dapagliflozin propanediol (FARXIGA) 10 MG TABS tablet Take 1 tablet (10 mg total) by mouth daily before breakfast. 02/25/23   Milford, Anderson Malta, FNP  ELIQUIS 5 MG TABS tablet TAKE 1 TABLET BY MOUTH TWICE A DAY 02/18/22   Laurey Morale, MD  finasteride (PROSCAR) 5 MG tablet Take 5 mg by mouth at bedtime.    [provider]  gabapentin (NEURONTIN) 300 MG capsule Take 300 mg by mouth 3 (three) times daily. 04/04/22   [provider]  LINZESS 145 MCG CAPS capsule Take 145 mcg by mouth every morning. 12/02/22   [provider]  magnesium oxide (MAG-OX) 400 (240 Mg) MG tablet TAKE 1 TABLET BY MOUTH EVERY DAY 12/30/22   Laurey Morale, MD  metoCLOPramide (REGLAN) 5 MG tablet Take 5 mg by mouth 3 (three) times daily before meals.    [provider]  midodrine (PROAMATINE) 5 MG tablet Take 3 tablets (15 mg total) by mouth 3 (three) times daily. 07/30/22   Laurey Morale, MD  Multiple Vitamin (MULTIVITAMIN WITH MINERALS) TABS tablet Take 1 tablet by mouth daily. 05/01/21   Marguerita Merles Latif, DO  OXYGEN Inhale 2 L into the lungs continuous.    [provider]  pantoprazole (PROTONIX) 40 MG tablet Take 1 tablet (40 mg total) by mouth every morning. 03/07/23   Cobb, Ruby Cola, NP  Riociguat 2 MG TABS Take 2 mg by mouth in the morning, at noon, and at bedtime. 08/30/22   Laurey Morale, MD  Tiotropium Bromide-Olodaterol (STIOLTO RESPIMAT) 2.5-2.5 MCG/ACT AERS Inhale 2 puffs into the lungs daily. 02/10/23   Oretha Milch, MD  torsemide (DEMADEX) 20 MG tablet Take 20 mg by mouth daily.    [provider]      Allergies    Other, Sunflower oil, and Sulfa antibiotics    Review of Systems   Review of Systems  Constitutional:  Positive for chills and fatigue. Negative for fever.  HENT:  Positive for congestion.   Respiratory:  Positive for cough, chest tightness and shortness of breath. Negative for wheezing.   Cardiovascular:  Negative for chest pain and palpitations.  Gastrointestinal:  Negative for abdominal pain, constipation, diarrhea, nausea and vomiting.  Genitourinary:  Negative for dysuria.  Musculoskeletal:  Negative for back pain.  Skin:  Negative for rash and wound.  Neurological:  Positive for weakness. Negative for headaches.  Psychiatric/Behavioral:  Negative for agitation.   All other systems reviewed and are negative.   Physical Exam Updated Vital Signs BP  (!) 147/74   Pulse (!) 52   Temp 98.6 F (37 C)   Resp (!) 24   SpO2 97%  Physical Exam Vitals and nursing note reviewed.  Constitutional:      General: He is not in acute distress.    Appearance: He is well-developed. He is not ill-appearing, toxic-appearing or diaphoretic.  HENT:     Head: Normocephalic and atraumatic.     Nose: No congestion or rhinorrhea.     Mouth/Throat:     Mouth: Mucous membranes are moist.     Pharynx: No oropharyngeal exudate or posterior oropharyngeal erythema.  Eyes:     Extraocular Movements: Extraocular movements intact.     Conjunctiva/sclera: Conjunctivae normal.     Pupils: Pupils are equal, round, and reactive to light.  Cardiovascular:     Rate and Rhythm: Regular rhythm. Tachycardia present.     Heart sounds: No murmur heard. Pulmonary:     Effort: Pulmonary effort is normal. No respiratory distress.     Breath sounds: Rhonchi present. No wheezing or rales.  Chest:     Chest wall: No tenderness.  Abdominal:     General: Abdomen is flat.     Palpations: Abdomen is soft.     Tenderness: There is no abdominal tenderness. There is no guarding or rebound.  Musculoskeletal:        General: No swelling or tenderness.     Cervical back: Neck supple. No tenderness.     Right lower leg: No edema.     Left lower leg: No edema.  Skin:    General: Skin is warm and dry.     Capillary Refill: Capillary refill takes less than 2 seconds.     Findings: No erythema.  Neurological:     General: No focal deficit present.     Mental Status: He is alert.     Sensory: No sensory deficit.     Motor: No weakness.  Psychiatric:        Mood and Affect: Mood normal.     ED Results / Procedures / Treatments   Labs (all labs ordered are listed, but only abnormal results are displayed) Labs Reviewed  CBC WITH DIFFERENTIAL/PLATELET - Abnormal; Notable for the following components:      Result Value   RDW 15.9 (*)    Lymphs Abs 0.3 (*)    All other  components within normal limits  COMPREHENSIVE METABOLIC PANEL - Abnormal; Notable for the following components:   Chloride 94 (*)    Glucose, Bld 100 (*)    Creatinine, Ser 1.52 (*)    Total Bilirubin 1.3 (*)    GFR, Estimated 45 (*)    All other components within normal limits  BRAIN NATRIURETIC PEPTIDE - Abnormal; Notable for the following components:   B Natriuretic Peptide 173.5 (*)    All other components within  normal limits  I-STAT CG4 LACTIC ACID, ED - Abnormal; Notable for the following components:   Lactic Acid, Venous 2.5 (*)    All other components within normal limits  TROPONIN I (HIGH SENSITIVITY) - Abnormal; Notable for the following components:   Troponin I (High Sensitivity) 36 (*)    All other components within normal limits  RESP PANEL BY RT-PCR (RSV, FLU A&B, COVID)  RVPGX2  CULTURE, BLOOD (ROUTINE X 2)  CULTURE, BLOOD (ROUTINE X 2)  I-STAT CG4 LACTIC ACID, ED    EKG EKG Interpretation Date/Time:  Thursday March 13 2023 10:39:43 EDT Ventricular Rate:  113 PR Interval:  151 QRS Duration:  166 QT Interval:  368 QTC Calculation: 505 R Axis:   116  Text Interpretation: Sinus tachycardia wandering baseline and artifact, will repeat. Confirmed by Theda Belfast ([id number]) on 03/13/2023 10:45:35 AM  Radiology CT Angio Chest PE W and/or Wo Contrast  Result Date: 03/13/2023 CLINICAL DATA:  Pulmonary embolism (PE) suspected, high prob EXAM: CT ANGIOGRAPHY CHEST WITH CONTRAST TECHNIQUE: Multidetector CT imaging of the chest was performed using the standard protocol during bolus administration of intravenous contrast. Multiplanar CT image reconstructions and MIPs were obtained to evaluate the vascular anatomy. RADIATION DOSE REDUCTION: This exam was performed according to the departmental dose-optimization program which includes automated exposure control, adjustment of the mA and/or kV according to patient size and/or use of iterative reconstruction technique. CONTRAST:   60mL OMNIPAQUE IOHEXOL 350 MG/ML SOLN COMPARISON:  CT scan chest from 03/16/2022. FINDINGS: Cardiovascular: No evidence of embolism to the proximal subsegmental pulmonary artery level. Mild cardiomegaly. No pericardial effusion. No aortic aneurysm. There are coronary artery calcifications, in keeping with coronary artery disease. Mediastinum/Nodes: Visualized thyroid gland appears grossly unremarkable. No solid / cystic mediastinal masses. The esophagus is dilated and filled with fluid/debris, which is nonspecific but most commonly seen with esophageal dysmotility or gastroesophageal reflux disease. There is also mild-to-moderate circumferential thickening of the lower thoracic esophagus, which can be seen with esophagitis. There is small-to-moderate sliding hiatal hernia. Findings appear grossly similar to the prior study however, correlate clinically to determine the need for additional evaluation with endoscopy. No axillary, mediastinal or hilar lymphadenopathy by size criteria. Lungs/Pleura: The central tracheo-bronchial tree is patent. There are moderate upper lobe predominant emphysematous changes. There are airspace opacities in the right lung lower lobe with associated trace pleural effusion, concerning for pneumonia. There are patchy opacities in the dependent left lung lower lobe as well, which may represent pneumonitis versus atelectasis. No mass or pneumothorax. No left pleural effusion. No suspicious lung nodules. Upper Abdomen: Small-to-moderate hiatal hernia again seen. Surgically absent gallbladder. Visualized upper abdominal viscera within normal limits. Musculoskeletal: The visualized soft tissues of the chest wall are grossly unremarkable. No suspicious osseous lesions. There are mild multilevel degenerative changes in the visualized spine. Review of the MIP images confirms the above findings. IMPRESSION: 1. No evidence of pulmonary embolism to the proximal subsegmental pulmonary artery level. 2.  Right lung lower lobe pneumonia with associated trace pleural effusion. 3. Small-to-moderate hiatal hernia with dilated esophagus filled with fluid/debris. There is also mild-to-moderate circumferential thickening of the lower thoracic esophagus, which can be seen with esophagitis. Findings appear grossly similar to the prior study however, correlate clinically to determine the need for additional evaluation with endoscopy. Emphysema (ICD10-J43.9). Electronically Signed   By: Jules Schick M.D.   On: 03/13/2023 14:53    Procedures Procedures     CRITICAL CARE Performed by: Canary Brim Darci Lykins Total critical  care time: 35 minutes Critical care time was exclusive of separately billable procedures and treating other patients. Critical care was necessary to treat or prevent imminent or life-threatening deterioration. Critical care was time spent personally by me on the following activities: development of treatment plan with patient and/or surrogate as well as nursing, discussions with consultants, evaluation of patient's response to treatment, examination of patient, obtaining history from patient or surrogate, ordering and performing treatments and interventions, ordering and review of laboratory studies, ordering and review of radiographic studies, pulse oximetry and re-evaluation of patient's condition.   Medications Ordered in ED Medications  lidocaine-EPINEPHrine (XYLOCAINE W/EPI) 2 %-1:200000 (PF) injection 10 mL (10 mLs Intradermal Not Given 03/13/23 1334)  lactated ringers infusion (has no administration in time range)  lactated ringers bolus 1,000 mL (1,000 mLs Intravenous New Bag/Given 03/13/23 1454)    And  lactated ringers bolus 1,000 mL (has no administration in time range)    And  lactated ringers bolus 500 mL (has no administration in time range)  ceFEPIme (MAXIPIME) 2 g in sodium chloride 0.9 % 100 mL IVPB (2 g Intravenous New Bag/Given 03/13/23 1501)  metroNIDAZOLE (FLAGYL)  IVPB 500 mg (500 mg Intravenous New Bag/Given 03/13/23 1505)  vancomycin (VANCOCIN) IVPB 1000 mg/200 mL premix (has no administration in time range)  vancomycin (VANCOREADY) IVPB 750 mg/150 mL (has no administration in time range)  iohexol (OMNIPAQUE) 350 MG/ML injection 60 mL (60 mLs Intravenous Contrast Given 03/13/23 1328)  acetaminophen (TYLENOL) tablet 500 mg (500 mg Oral Given 03/13/23 1401)    ED Course/ Medical Decision Making/ A&P                             Medical Decision Making Amount and/or Complexity of Data Reviewed Labs: ordered. Radiology: ordered.  Risk OTC drugs. Prescription drug management. Decision regarding hospitalization.    Joshua [name] is a 87 y.o. male with a past medical history significant for COPD on home oxygen, pulmonary hypertension, previous pulmonary embolism on Eliquis therapy, CHF, CKD, gout, recent pneumonia, and previous NSTEMI who presents with left shoulder pain, shortness of breath, and fatigue.  According to patient, he recently was admitted in [place] for pneumonia several weeks ago and has been doing fairly well.  He reports that over the last few days he has had more fatigue and has had some more shortness of breath.  He reports he has had a chronic cough that seems similar to prior but has been more winded.  He started having the left shoulder pain today that is more of a soreness and aching.  He denies any trauma or any heavy lifting.  He does report he drove back from [place] recently but denies missing his Eliquis for the previous thromboembolic disease.  Denies any new leg pain or leg swelling.  He denies any fevers, chills, or congestion.  Denies nausea, vomiting, constipation, diarrhea, or urinary changes.  Denies other complaints initially.  Denies headache, neck pain, neck stiffness.    On exam, patient feels warm to the touch.  His lungs did have some rhonchi bilaterally but chest was nontender.  Abdomen nontender.  He does  have intact pulse in extremities and has no significant edema.  No focal neurologic deficits.  Patient resting comfortably but does have tachycardia with rates going into the 120s.  He is also slightly tachypneic but oxygen saturations are in the 90s on his home 3 L.  EKG had artifact and  wandering baseline so will be repeated.  Overall morphology appears similar to prior.  Do not think there is a STEMI initially.  Will repeat EKG.  Clinically, I do feel patient needs cardiac workup as well as workup to rule out a PE.  With his cough, the shortness of breath, I am worried about recurrent pneumonia.  Will see what imaging and labs show.  Anticipate reassessment after workup to determine disposition.  1:51 PM Repeat EKG was obtained and looks similar to what he has had in the past without STEMI.  Rectal temperature was performed and he was found to be febrile temperature of 103.1.  Liver function is not elevated so we will give Tylenol for this.  CBC reassuring.  Troponin slightly elevated at 36 but is improved from prior, will trend.  Will await the rest of his workup and the CT results.  Anticipate admission  2:53 PM While awaiting workup to be completed including the CT scan results, patient blood pressure has now dropped.  Blood pressures in the 80s systolic.  He now does have lactic acidosis and is meeting sepsis criteria with a fever, tachycardia intermittently, tachypnea, and hypotension.  Will make a code sepsis and give him fluids and broad-spectrum antibiotics.  Will wait for results but he will need admission for further management.         Final Clinical Impression(s) / ED Diagnoses Final diagnoses:  Sepsis due to pneumonia [facility])     Clinical Impression: 1. Sepsis due to pneumonia [facility])     Disposition: Admit  This note was prepared with assistance of Dragon voice recognition software. Occasional wrong-word or sound-a-like substitutions may have occurred due to the  inherent limitations of voice recognition software.     Hawken Bielby, Canary Brim, MD 03/13/23 [phone]

## 2023-03-13 NOTE — ED Triage Notes (Signed)
Patient BIB EMS from home c/o generalized weakness and pain left shoulder. Wheezing. Was Dx with PNA completed ABT therapy.  O2 @ 2 liters at home. H/O COPD. 120/70, 100, 94-95% on 3liters. CBG 125. 18gLAC, IVF

## 2023-03-13 NOTE — ED Notes (Signed)
ED TO INPATIENT HANDOFF REPORT  ED Nurse Name and Phone #:   S Name/Age/Gender Joshua [name] 87 y.o. male Room/Bed: 022C/022C  Code Status   Code Status: Full Code  Home/SNF/Other Home Patient oriented to: self, place, time, and situation Is this baseline? Yes   Triage Complete: Triage complete  Chief Complaint Sepsis [facility]) [A41.9]  Triage Note Patient BIB EMS from home c/o generalized weakness and pain left shoulder. Wheezing. Was Dx with PNA completed ABT therapy.  O2 @ 2 liters at home. H/O COPD. 120/70, 100, 94-95% on 3liters. CBG 125. 18gLAC, IVF   Allergies Allergies  Allergen Reactions   Other Swelling    Beer- Swelling    Sunflower Oil Swelling   Sulfa Antibiotics Rash    Level of Care/Admitting Diagnosis ED Disposition     ED Disposition  Admit   Condition  --   Comment  Hospital Area: [facility] [[id number]]  Level of Care: Progressive [102]  Admit to Progressive based on following criteria: MULTISYSTEM THREATS such as stable sepsis, metabolic/electrolyte imbalance with or without encephalopathy that is responding to early treatment.  May place patient in observation at [facility] or [facility] if equivalent level of care is available:: No  Covid Evaluation: Asymptomatic - no recent exposure (last 10 days) testing not required  Diagnosis: Sepsis [facility] [[id number]]  Admitting Physician: Emeline General [[id number]]  Attending Physician: Emeline General [[id number]]          B Medical/Surgery History Past Medical History:  Diagnosis Date   Arthritis    BPH (benign prostatic hyperplasia)    CHF (congestive heart failure) (HCC)    Chronic hiccups    COPD (chronic obstructive pulmonary disease) (HCC)    Folliculitis    posterior scalp per office visit note of Dr Selena Batten 07/20/2014     GERD (gastroesophageal reflux disease)    Gout    Hypertension    Hypotension    NSTEMI (non-ST elevated myocardial infarction) (HCC) 03/19/2022   PE  (pulmonary thromboembolism) (HCC)    Phlebitis    right arm  at least 20 years ago    Pneumonia    hx of pneumonia as a child    Pulmonary embolism (HCC) 03/17/2022   Pulmonary hypertension (HCC)    Severe sepsis (HCC) 03/16/2022   Sleep apnea    Past Surgical History:  Procedure Laterality Date   BIOPSY  11/03/2020   Procedure: BIOPSY;  Surgeon: Jeani Hawking, MD;  Location: [facility] ENDOSCOPY;  Service: Endoscopy;;   BIOPSY  07/06/2021   Procedure: BIOPSY;  Surgeon: Jeani Hawking, MD;  Location: [facility];  Service: Endoscopy;;   bone removed from little toe right foot      CHOLECYSTECTOMY     ESOPHAGOGASTRODUODENOSCOPY (EGD) WITH PROPOFOL N/A 11/03/2020   Procedure: ESOPHAGOGASTRODUODENOSCOPY (EGD) WITH PROPOFOL;  Surgeon: Jeani Hawking, MD;  Location: [facility];  Service: Endoscopy;  Laterality: N/A;   ESOPHAGOGASTRODUODENOSCOPY (EGD) WITH PROPOFOL N/A 07/06/2021   Procedure: ESOPHAGOGASTRODUODENOSCOPY (EGD) WITH PROPOFOL;  Surgeon: Jeani Hawking, MD;  Location: [facility]OS[facility];  Service: Endoscopy;  Laterality: N/A;   pilonidal cyst removal      RIGHT HEART CATH N/A 12/13/2020   Procedure: RIGHT HEART CATH;  Surgeon: Laurey Morale, MD;  Location: [facility] INVASIVE CV LAB;  Service: Cardiovascular;  Laterality: N/A;   RIGHT/LEFT HEART CATH AND CORONARY ANGIOGRAPHY N/A 03/21/2022   Procedure: RIGHT/LEFT HEART CATH AND CORONARY ANGIOGRAPHY;  Surgeon: Laurey Morale, MD;  Location: [facility] INVASIVE CV  LAB;  Service: Cardiovascular;  Laterality: N/A;   TOTAL KNEE ARTHROPLASTY Left 09/13/2014   Procedure: LEFT TOTAL KNEE ARTHROPLASTY;  Surgeon: Shelda Pal, MD;  Location: [facility] ORS;  Service: Orthopedics;  Laterality: Left;     A IV Location/Drains/Wounds Patient Lines/Drains/Airways Status     Active Line/Drains/Airways     Name Placement date Placement time Site Days   Peripheral IV 07/16/22 20 G Anterior;Left Forearm 07/16/22  0134  Forearm  240   Peripheral IV 07/16/22 20 G Anterior;Right  Forearm 07/16/22  0153  Forearm  240   Peripheral IV 07/16/22 22 G 1.75" Right;Medial Forearm 07/16/22  1523  Forearm  240   Peripheral IV 03/13/23 18 G Anterior;Distal;Left;Upper Arm 03/13/23  1040  Arm  less than 1   Peripheral IV 03/13/23 18 G Right;Posterior Forearm 03/13/23  1157  Forearm  less than 1            Intake/Output Last 24 hours No intake or output data in the 24 hours ending 03/13/23 1627  Labs/Imaging Results for orders placed or performed during the hospital encounter of 03/13/23 (from the past 48 hour(s))  CBC with Differential     Status: Abnormal   Collection Time: 03/13/23 11:16 AM  Result Value Ref Range   WBC 8.4 4.0 - 10.5 K/uL   RBC 4.84 4.22 - 5.81 MIL/uL   Hemoglobin 13.1 13.0 - 17.0 g/dL   HCT 33.2 95.1 - 88.4 %   MCV 87.6 80.0 - 100.0 fL   MCH 27.1 26.0 - 34.0 pg   MCHC 30.9 30.0 - 36.0 g/dL   RDW 16.6 (H) 06.3 - 01.6 %   Platelets 151 150 - 400 K/uL   nRBC 0.0 0.0 - 0.2 %   Neutrophils Relative % 89 %   Neutro Abs 7.5 1.7 - 7.7 K/uL   Lymphocytes Relative 4 %   Lymphs Abs 0.3 (L) 0.7 - 4.0 K/uL   Monocytes Relative 6 %   Monocytes Absolute 0.5 0.1 - 1.0 K/uL   Eosinophils Relative 0 %   Eosinophils Absolute 0.0 0.0 - 0.5 K/uL   Basophils Relative 0 %   Basophils Absolute 0.0 0.0 - 0.1 K/uL   Immature Granulocytes 1 %   Abs Immature Granulocytes 0.05 0.00 - 0.07 K/uL    Comment: Performed at [facility], [address] [place], [place] [address]  Comprehensive metabolic panel     Status: Abnormal   Collection Time: 03/13/23 11:16 AM  Result Value Ref Range   Sodium 139 135 - 145 mmol/L   Potassium 4.0 3.5 - 5.1 mmol/L    Comment: HEMOLYSIS AT THIS LEVEL MAY AFFECT RESULT   Chloride 94 (L) 98 - 111 mmol/L   CO2 31 22 - 32 mmol/L   Glucose, Bld 100 (H) 70 - 99 mg/dL    Comment: Glucose reference range applies only to samples taken after fasting for at least 8 hours.   BUN 20 8 - 23 mg/dL   Creatinine, Ser 2.35 (H) 0.61 - 1.24  mg/dL   Calcium 9.4 8.9 - 57.3 mg/dL   Total Protein 7.2 6.5 - 8.1 g/dL   Albumin 3.7 3.5 - 5.0 g/dL   AST 39 15 - 41 U/L    Comment: HEMOLYSIS AT THIS LEVEL MAY AFFECT RESULT   ALT 20 0 - 44 U/L    Comment: HEMOLYSIS AT THIS LEVEL MAY AFFECT RESULT   Alkaline Phosphatase 85 38 - 126 U/L   Total Bilirubin 1.3 (  H) 0.3 - 1.2 mg/dL    Comment: HEMOLYSIS AT THIS LEVEL MAY AFFECT RESULT   GFR, Estimated 45 (L) >60 mL/min    Comment: (NOTE) Calculated using the CKD-EPI Creatinine Equation (2021)    Anion gap 14 5 - 15    Comment: Performed at [facility], [address], [place], [place] [address]  Troponin I (High Sensitivity)     Status: Abnormal   Collection Time: 03/13/23 11:16 AM  Result Value Ref Range   Troponin I (High Sensitivity) 36 (H) <18 ng/L    Comment: (NOTE) Elevated high sensitivity troponin I (hsTnI) values and significant  changes across serial measurements may suggest ACS but many other  chronic and acute conditions are known to elevate hsTnI results.  Refer to the "Links" section for chest pain algorithms and additional  guidance. Performed at [facility], [address], [place], [place] [address]   Brain natriuretic peptide     Status: Abnormal   Collection Time: 03/13/23 11:16 AM  Result Value Ref Range   B Natriuretic Peptide 173.5 (H) 0.0 - 100.0 pg/mL    Comment: Performed at [facility], [address] [place], [place] [address]  Resp panel by RT-PCR (RSV, Flu A&B, Covid)     Status: None   Collection Time: 03/13/23 11:18 AM   Specimen: Nasal Swab  Result Value Ref Range   SARS Coronavirus 2 by RT PCR NEGATIVE NEGATIVE   Influenza A by PCR NEGATIVE NEGATIVE   Influenza B by PCR NEGATIVE NEGATIVE    Comment: (NOTE) The Xpert Xpress SARS-CoV-2/FLU/RSV plus assay is intended as an aid in the diagnosis of influenza from Nasopharyngeal swab specimens and should not be used as a sole basis for treatment. Nasal washings and aspirates  are unacceptable for Xpert Xpress SARS-CoV-2/FLU/RSV testing.  Fact Sheet for Patients: BloggerCourse.com  Fact Sheet for Healthcare Providers: SeriousBroker.it  This test is not yet approved or cleared by the [place] FDA and has been authorized for detection and/or diagnosis of SARS-CoV-2 by FDA under an Emergency Use Authorization (EUA). This EUA will remain in effect (meaning this test can be used) for the duration of the COVID-19 declaration under Section 564(b)(1) of the Act, 21 U.S.C. section 360bbb-3(b)(1), unless the authorization is terminated or revoked.     Resp Syncytial Virus by PCR NEGATIVE NEGATIVE    Comment: (NOTE) Fact Sheet for Patients: BloggerCourse.com  Fact Sheet for Healthcare Providers: SeriousBroker.it  This test is not yet approved or cleared by the [place] FDA and has been authorized for detection and/or diagnosis of SARS-CoV-2 by FDA under an Emergency Use Authorization (EUA). This EUA will remain in effect (meaning this test can be used) for the duration of the COVID-19 declaration under Section 564(b)(1) of the Act, 21 U.S.C. section 360bbb-3(b)(1), unless the authorization is terminated or revoked.  Performed at [facility], [address] [place], [place] [address]   I-Stat CG4 Lactic Acid     Status: Abnormal   Collection Time: 03/13/23 12:10 PM  Result Value Ref Range   Lactic Acid, Venous 2.5 (HH) 0.5 - 1.9 mmol/L   Comment NOTIFIED PHYSICIAN   Urinalysis, Routine w reflex microscopic -Urine, Clean Catch     Status: Abnormal   Collection Time: 03/13/23  3:52 PM  Result Value Ref Range   Color, Urine YELLOW YELLOW   APPearance CLEAR CLEAR   Specific Gravity, Urine 1.021 1.005 - 1.030   pH 8.0 5.0 -  8.0   Glucose, UA >=500 (A) NEGATIVE mg/dL   Hgb urine dipstick NEGATIVE NEGATIVE   Bilirubin Urine NEGATIVE NEGATIVE    Ketones, ur NEGATIVE NEGATIVE mg/dL   Protein, ur NEGATIVE NEGATIVE mg/dL   Nitrite NEGATIVE NEGATIVE   Leukocytes,Ua TRACE (A) NEGATIVE   RBC / HPF 0-5 0 - 5 RBC/hpf   WBC, UA 0-5 0 - 5 WBC/hpf   Bacteria, UA NONE SEEN NONE SEEN   Squamous Epithelial / HPF 0-5 0 - 5 /HPF    Comment: Performed at [facility], [address], [place], [place] [address]  I-Stat CG4 Lactic Acid     Status: None   Collection Time: 03/13/23  3:57 PM  Result Value Ref Range   Lactic Acid, Venous 1.4 0.5 - 1.9 mmol/L   CT Angio Chest PE W and/or Wo Contrast  Result Date: 03/13/2023 CLINICAL DATA:  Pulmonary embolism (PE) suspected, high prob EXAM: CT ANGIOGRAPHY CHEST WITH CONTRAST TECHNIQUE: Multidetector CT imaging of the chest was performed using the standard protocol during bolus administration of intravenous contrast. Multiplanar CT image reconstructions and MIPs were obtained to evaluate the vascular anatomy. RADIATION DOSE REDUCTION: This exam was performed according to the departmental dose-optimization program which includes automated exposure control, adjustment of the mA and/or kV according to patient size and/or use of iterative reconstruction technique. CONTRAST:  60mL OMNIPAQUE IOHEXOL 350 MG/ML SOLN COMPARISON:  CT scan chest from 03/16/2022. FINDINGS: Cardiovascular: No evidence of embolism to the proximal subsegmental pulmonary artery level. Mild cardiomegaly. No pericardial effusion. No aortic aneurysm. There are coronary artery calcifications, in keeping with coronary artery disease. Mediastinum/Nodes: Visualized thyroid gland appears grossly unremarkable. No solid / cystic mediastinal masses. The esophagus is dilated and filled with fluid/debris, which is nonspecific but most commonly seen with esophageal dysmotility or gastroesophageal reflux disease. There is also mild-to-moderate circumferential thickening of the lower thoracic esophagus, which can be seen with esophagitis. There is  small-to-moderate sliding hiatal hernia. Findings appear grossly similar to the prior study however, correlate clinically to determine the need for additional evaluation with endoscopy. No axillary, mediastinal or hilar lymphadenopathy by size criteria. Lungs/Pleura: The central tracheo-bronchial tree is patent. There are moderate upper lobe predominant emphysematous changes. There are airspace opacities in the right lung lower lobe with associated trace pleural effusion, concerning for pneumonia. There are patchy opacities in the dependent left lung lower lobe as well, which may represent pneumonitis versus atelectasis. No mass or pneumothorax. No left pleural effusion. No suspicious lung nodules. Upper Abdomen: Small-to-moderate hiatal hernia again seen. Surgically absent gallbladder. Visualized upper abdominal viscera within normal limits. Musculoskeletal: The visualized soft tissues of the chest wall are grossly unremarkable. No suspicious osseous lesions. There are mild multilevel degenerative changes in the visualized spine. Review of the MIP images confirms the above findings. IMPRESSION: 1. No evidence of pulmonary embolism to the proximal subsegmental pulmonary artery level. 2. Right lung lower lobe pneumonia with associated trace pleural effusion. 3. Small-to-moderate hiatal hernia with dilated esophagus filled with fluid/debris. There is also mild-to-moderate circumferential thickening of the lower thoracic esophagus, which can be seen with esophagitis. Findings appear grossly similar to the prior study however, correlate clinically to determine the need for additional evaluation with endoscopy. Emphysema (ICD10-J43.9). Electronically Signed   By: Jules Schick M.D.   On: 03/13/2023 14:53    Pending Labs Unresulted Labs (From admission, onward)     Start     Ordered   03/14/23 0500  CBC  Tomorrow morning,  R        03/13/23 1624   03/14/23 0500  Basic metabolic panel  Tomorrow morning,   R         03/13/23 1624   03/13/23 1624  Expectorated Sputum Assessment w Gram Stain, Rflx to Resp Cult  (COPD / Pneumonia / Cellulitis / Lower Extremity Wound)  Once,   R        03/13/23 1624   03/13/23 1624  Strep pneumoniae urinary antigen  (COPD / Pneumonia / Cellulitis / Lower Extremity Wound)  Once,   R        03/13/23 1624   03/13/23 1624  Legionella Pneumophila Serogp 1 Ur Ag  (COPD / Pneumonia / Cellulitis / Lower Extremity Wound)  Once,   R        03/13/23 1624   03/13/23 1623  Hemoglobin A1c  Once,   R       Comments: To assess prior glycemic control    03/13/23 1624   03/13/23 1117  Blood culture (routine x 2)  BLOOD CULTURE X 2,   R (with STAT occurrences)      03/13/23 1117            Vitals/Pain Today's Vitals   03/13/23 1530 03/13/23 1545 03/13/23 1600 03/13/23 1615  BP: (!) 95/51 (!) 85/49 (!) 88/43 (!) 94/51  Pulse: 88 84  81  Resp: 16 19 (!) 29 16  Temp:      TempSrc:      SpO2: 100% 100% 99% 100%    Isolation Precautions No active isolations  Medications Medications  lidocaine-EPINEPHrine (XYLOCAINE W/EPI) 2 %-1:200000 (PF) injection 10 mL (10 mLs Intradermal Not Given 03/13/23 1334)  lactated ringers infusion (has no administration in time range)  lactated ringers bolus 1,000 mL (0 mLs Intravenous Stopped 03/13/23 1536)    And  lactated ringers bolus 1,000 mL (1,000 mLs Intravenous New Bag/Given 03/13/23 1537)    And  lactated ringers bolus 500 mL (has no administration in time range)  vancomycin (VANCOCIN) IVPB 1000 mg/200 mL premix (has no administration in time range)  vancomycin (VANCOREADY) IVPB 750 mg/150 mL (750 mg Intravenous New Bag/Given 03/13/23 1536)  allopurinol (ZYLOPRIM) tablet 100 mg (has no administration in time range)  atorvastatin (LIPITOR) tablet 40 mg (has no administration in time range)  midodrine (PROAMATINE) tablet 15 mg (has no administration in time range)  Riociguat TABS 2 mg (has no administration in time range)  Acidophilus  Lactobacillus CAPS 1 capsule (has no administration in time range)  linaclotide (LINZESS) capsule 145 mcg (has no administration in time range)  metoCLOPramide (REGLAN) tablet 5 mg (has no administration in time range)  pantoprazole (PROTONIX) EC tablet 40 mg (has no administration in time range)  finasteride (PROSCAR) tablet 5 mg (has no administration in time range)  apixaban (ELIQUIS) tablet 5 mg (has no administration in time range)  gabapentin (NEURONTIN) capsule 300 mg (has no administration in time range)  magnesium oxide (MAG-OX) tablet 400 mg (has no administration in time range)  multivitamin with minerals tablet 1 tablet (has no administration in time range)  albuterol (VENTOLIN HFA) 108 (90 Base) MCG/ACT inhaler 1 puff (has no administration in time range)  arformoterol (BROVANA) nebulizer solution 15 mcg (has no administration in time range)    And  umeclidinium bromide (INCRUSE ELLIPTA) 62.5 MCG/ACT 1 puff (has no administration in time range)  cefTRIAXone (ROCEPHIN) 2 g in sodium chloride 0.9 % 100 mL IVPB (has no administration in time  range)  azithromycin (ZITHROMAX) tablet 500 mg (has no administration in time range)  insulin aspart (novoLOG) injection 0-15 Units (has no administration in time range)  ipratropium-albuterol (DUONEB) 0.5-2.5 (3) MG/3ML nebulizer solution 3 mL (has no administration in time range)  guaiFENesin (MUCINEX) 12 hr tablet 1,200 mg (has no administration in time range)  predniSONE (DELTASONE) tablet 40 mg (has no administration in time range)  iohexol (OMNIPAQUE) 350 MG/ML injection 60 mL (60 mLs Intravenous Contrast Given 03/13/23 1328)  acetaminophen (TYLENOL) tablet 500 mg (500 mg Oral Given 03/13/23 1401)  ceFEPIme (MAXIPIME) 2 g in sodium chloride 0.9 % 100 mL IVPB (0 g Intravenous Stopped 03/13/23 1534)  metroNIDAZOLE (FLAGYL) IVPB 500 mg (500 mg Intravenous New Bag/Given 03/13/23 1505)    Mobility walks     Focused Assessments Pulmonary  Assessment Handoff:  Lung sounds:   O2 Device: Nasal Cannula O2 Flow Rate (L/min): 3 L/min    R Recommendations: See Admitting Provider Note  Report given to:   Additional Notes:

## 2023-03-13 NOTE — Sepsis Progress Note (Signed)
Code Sepsis protocol being monitored by eLink. 

## 2023-03-13 NOTE — Progress Notes (Signed)
ED Pharmacy Antibiotic Sign Off An antibiotic consult was received from an ED provider for vancomycin/cefepime per pharmacy dosing for sepsis. A chart review was completed to assess appropriateness.   The following one time order(s) were placed:  Cefepime 2 g IV x1 Vancomycin 1750 mg IV x1  Further antibiotic and/or antibiotic pharmacy consults should be ordered by the admitting provider if indicated.   Thank you for allowing pharmacy to be a part of this patient's care.   Rutherford Nail, PharmD PGY2 Critical Care Pharmacy Resident 03/13/23 3:18 PM

## 2023-03-14 DIAGNOSIS — J9611 Chronic respiratory failure with hypoxia: Secondary | ICD-10-CM | POA: Diagnosis present

## 2023-03-14 DIAGNOSIS — Z882 Allergy status to sulfonamides status: Secondary | ICD-10-CM | POA: Diagnosis not present

## 2023-03-14 DIAGNOSIS — Z87891 Personal history of nicotine dependence: Secondary | ICD-10-CM | POA: Diagnosis not present

## 2023-03-14 DIAGNOSIS — N138 Other obstructive and reflux uropathy: Secondary | ICD-10-CM | POA: Diagnosis present

## 2023-03-14 DIAGNOSIS — J189 Pneumonia, unspecified organism: Secondary | ICD-10-CM | POA: Diagnosis present

## 2023-03-14 DIAGNOSIS — I951 Orthostatic hypotension: Secondary | ICD-10-CM | POA: Diagnosis present

## 2023-03-14 DIAGNOSIS — J440 Chronic obstructive pulmonary disease with acute lower respiratory infection: Secondary | ICD-10-CM | POA: Diagnosis present

## 2023-03-14 DIAGNOSIS — Z9981 Dependence on supplemental oxygen: Secondary | ICD-10-CM | POA: Diagnosis not present

## 2023-03-14 DIAGNOSIS — Z1152 Encounter for screening for COVID-19: Secondary | ICD-10-CM | POA: Diagnosis not present

## 2023-03-14 DIAGNOSIS — Z96652 Presence of left artificial knee joint: Secondary | ICD-10-CM | POA: Diagnosis present

## 2023-03-14 DIAGNOSIS — I110 Hypertensive heart disease with heart failure: Secondary | ICD-10-CM | POA: Diagnosis present

## 2023-03-14 DIAGNOSIS — Z86711 Personal history of pulmonary embolism: Secondary | ICD-10-CM | POA: Diagnosis not present

## 2023-03-14 DIAGNOSIS — J159 Unspecified bacterial pneumonia: Secondary | ICD-10-CM | POA: Diagnosis present

## 2023-03-14 DIAGNOSIS — A419 Sepsis, unspecified organism: Secondary | ICD-10-CM | POA: Diagnosis present

## 2023-03-14 DIAGNOSIS — Z7901 Long term (current) use of anticoagulants: Secondary | ICD-10-CM | POA: Diagnosis not present

## 2023-03-14 DIAGNOSIS — R6520 Severe sepsis without septic shock: Secondary | ICD-10-CM | POA: Diagnosis present

## 2023-03-14 DIAGNOSIS — R531 Weakness: Secondary | ICD-10-CM | POA: Diagnosis present

## 2023-03-14 DIAGNOSIS — Z79899 Other long term (current) drug therapy: Secondary | ICD-10-CM | POA: Diagnosis not present

## 2023-03-14 DIAGNOSIS — I2729 Other secondary pulmonary hypertension: Secondary | ICD-10-CM | POA: Diagnosis present

## 2023-03-14 DIAGNOSIS — I2781 Cor pulmonale (chronic): Secondary | ICD-10-CM | POA: Diagnosis present

## 2023-03-14 DIAGNOSIS — I5032 Chronic diastolic (congestive) heart failure: Secondary | ICD-10-CM | POA: Diagnosis present

## 2023-03-14 DIAGNOSIS — K219 Gastro-esophageal reflux disease without esophagitis: Secondary | ICD-10-CM | POA: Diagnosis not present

## 2023-03-14 DIAGNOSIS — J441 Chronic obstructive pulmonary disease with (acute) exacerbation: Secondary | ICD-10-CM | POA: Diagnosis present

## 2023-03-14 DIAGNOSIS — E872 Acidosis, unspecified: Secondary | ICD-10-CM | POA: Diagnosis present

## 2023-03-14 DIAGNOSIS — E785 Hyperlipidemia, unspecified: Secondary | ICD-10-CM | POA: Diagnosis present

## 2023-03-14 DIAGNOSIS — J690 Pneumonitis due to inhalation of food and vomit: Secondary | ICD-10-CM | POA: Diagnosis present

## 2023-03-14 DIAGNOSIS — M109 Gout, unspecified: Secondary | ICD-10-CM | POA: Diagnosis present

## 2023-03-14 LAB — GLUCOSE, CAPILLARY: Glucose-Capillary: 105 mg/dL — ABNORMAL HIGH (ref 70–99)

## 2023-03-14 MED ORDER — SODIUM CHLORIDE 0.9 % IV SOLN
3.0000 g | Freq: Four times a day (QID) | INTRAVENOUS | Status: DC
  Administered 2023-03-14 – 2023-03-16 (×8): 3 g via INTRAVENOUS
  Filled 2023-03-14 (×8): qty 8

## 2023-03-14 MED ORDER — PREDNISONE 20 MG PO TABS
20.0000 mg | ORAL_TABLET | Freq: Every day | ORAL | Status: AC
  Administered 2023-03-15 – 2023-03-16 (×2): 20 mg via ORAL
  Filled 2023-03-14 (×2): qty 1

## 2023-03-14 MED ORDER — IPRATROPIUM-ALBUTEROL 0.5-2.5 (3) MG/3ML IN SOLN: 3.0000 mL | Freq: Four times a day (QID) | RESPIRATORY_TRACT | Status: DC | PRN

## 2023-03-14 MED ORDER — PANTOPRAZOLE SODIUM 40 MG PO TBEC
40.0000 mg | DELAYED_RELEASE_TABLET | Freq: Two times a day (BID) | ORAL | Status: DC
  Administered 2023-03-14 – 2023-03-16 (×4): 40 mg via ORAL
  Filled 2023-03-14 (×4): qty 1

## 2023-03-14 NOTE — Plan of Care (Signed)

## 2023-03-14 NOTE — Progress Notes (Signed)
PROGRESS NOTE    Joshua [name]  WUJ:[id number] DOB: [birth year] DOA: 03/13/2023 PCP: [name], DO  Chief Complaint  Patient presents with   Weakness   Shoulder Pain    Left    Brief Narrative:   Joshua [name] is a 87 y.o. male with medical history significant of COPD Gold stage III, restrictive lung disease, pulmonary hypertension, cor pulmonale, chronic hypoxic respiratory failure on 2 L continuously,, chronic HFpEF, chronic orthostatic hypotension on midodrine, IIDM, GERD, BPH with chronic bladder outlet obstruction and self cathetering 3 times daily, on Eliquis, presented with worsening of cough, wheezing, generalized weakness.   Patient traveled to [place] 3 weeks ago and was feeling sick x 1 day and went to a local hospital, when he was diagnosed with CAP and COPD exacerbation, was treated with antibiotics and 7 days of p.o. antibiotics plus tapering of prednisone for 10 days.  Last night, patient started to have increasing of coughing, dry, denies any fever chills denied any chest pains.  This morning, patient became very weak, with increasing wheezing and left-sided shoulder pain and came to the hospital.  He denies any diarrhea, no urinary problems no rash or any injury.  At baseline, patient has chronic hypotension on midodrine, baseline SBP runs 95-100   ED Course: Fever 103.1, borderline tachycardia, blood pressure 95/51, and patient received 2.5 L IV bolus, SBP increased to 99.  Blood work showed creatinine 1.5, WBC 8.4, hemoglobin 13.1.  CT chest showed right lower lobe pneumonia.   Patient was given vancomycin and cefepime in the ED Assessment & Plan:   Active Problems:   Chronic diastolic CHF (congestive heart failure) (HCC)   CAP (community acquired pneumonia)   Sepsis due to pneumonia (HCC)   Sepsis, due to aspiration pneumonia RLL bacterial pneumonia -His criteria met on admission, tachypneic, tachycardic, febrile 102.4 with elevated lactic acid  (hypotension unclear if related to sepsis as patient with chronic hypotension, but he does meet criteria for sepsis and dependent of hypertension) -Right lower lobe infiltrate suspicious for aspiration pneumonia, this is most likely in the setting of his hiatal hernia contributing to recurrent aspiration events, especially as discussed with him he is with increased cough at nighttime and upon laying flat -I have discussed with him elevation of the bed, not eating late before laying flat and other techniques to avoid aspiration in the setting of hiatal hernia, will increase his Protonix to 40 mg twice daily -Continue with azithromycin, but I well change IV Rocephin to Unasyn for better coverage, especially this is his second admission within 1 month for up pneumonia   Acute COPD exacerbation -Short course of p.o. steroids no wheezing has improved, will steroids -DuoNebs -Continue ICS and LABA   Chronic hypotension -Blood pressure above his baseline, received IV bolus in the ED, will hold off maintenance IV fluid -Hold off diuresis -Continue midodrine   History of chronic HFpEF Pulmonary hypertension Cor pulmonale -Euvolemic, hold off diuresis today   BPH with chronic bladder obstruction -Continue self catheterization, ordered set placed -UA is negative   IIDM -SSI   Hx of PE -CTA negative for PE -Continue Eliquis    DVT prophylaxis: on Eliquis Code Status: Full Family Communication: D/W wife at bedside Disposition:   Status is:   Consultants:  None  Subjective:  Patient reports cough mainly at bedtime  Objective: Vitals:   03/14/23 0307 03/14/23 0400 03/14/23 0740 03/14/23 1047  BP: 100/64 (!) 118/56 128/64 128/69  Pulse: 75 71 82 82  Resp: 15 15 16  (!) 25  Temp: 98.9 F (37.2 C)  98 F (36.7 C) 98.4 F (36.9 C)  TempSrc: Oral  Oral Oral  SpO2: (!) 89% 92% 97% 94%    Intake/Output Summary (Last 24 hours) at 03/14/2023 1403 Last data filed at 03/14/2023  1047 Gross per 24 hour  Intake 240 ml  Output 1475 ml  Net -1235 ml   There were no vitals filed for this visit.  Examination:  Awake Alert, Oriented X 3, No new F.N deficits, Normal affect Symmetrical Chest wall movement, Good air movement bilaterally, lung base Rales RRR,No Gallops,Rubs or new Murmurs, No Parasternal Heave +ve B.Sounds, Abd Soft, No tenderness, No rebound - guarding or rigidity. No Cyanosis, Clubbing or edema, No new Rash or bruise       Data Reviewed: I have personally reviewed following labs and imaging studies  CBC: Recent Labs  Lab 03/13/23 1116 03/14/23 0039  WBC 8.4 6.2  NEUTROABS 7.5  --   HGB 13.1 11.4*  HCT 42.4 36.0*  MCV 87.6 86.3  PLT 151 135*    Basic Metabolic Panel: Recent Labs  Lab 03/13/23 1116 03/14/23 0039  NA 139 136  K 4.0 3.5  CL 94* 96*  CO2 31 32  GLUCOSE 100* 117*  BUN 20 18  CREATININE 1.52* 1.24  CALCIUM 9.4 9.1    GFR: Estimated Creatinine Clearance: 49.5 mL/min (by C-G formula based on SCr of 1.24 mg/dL).  Liver Function Tests: Recent Labs  Lab 03/13/23 1116  AST 39  ALT 20  ALKPHOS 85  BILITOT 1.3*  PROT 7.2  ALBUMIN 3.7    CBG: Recent Labs  Lab 03/13/23 1701 03/13/23 2137 03/14/23 0736  GLUCAP 94 99 105*     Recent Results (from the past 240 hour(s))  Blood culture (routine x 2)     Status: None (Preliminary result)   Collection Time: 03/13/23 11:17 AM   Specimen: BLOOD  Result Value Ref Range Status   Specimen Description BLOOD LEFT ANTECUBITAL  Final   Special Requests   Final    BOTTLES DRAWN AEROBIC AND ANAEROBIC Blood Culture results may not be optimal due to an inadequate volume of blood received in culture bottles   Culture   Final    NO GROWTH < 24 HOURS Performed at [facility], [address], [place], [place] [address]    Report Status PENDING  Incomplete  Resp panel by RT-PCR (RSV, Flu A&B, Covid)     Status: None   Collection Time: 03/13/23 11:18 AM    Specimen: Nasal Swab  Result Value Ref Range Status   SARS Coronavirus 2 by RT PCR NEGATIVE NEGATIVE Final   Influenza A by PCR NEGATIVE NEGATIVE Final   Influenza B by PCR NEGATIVE NEGATIVE Final    Comment: (NOTE) The Xpert Xpress SARS-CoV-2/FLU/RSV plus assay is intended as an aid in the diagnosis of influenza from Nasopharyngeal swab specimens and should not be used as a sole basis for treatment. Nasal washings and aspirates are unacceptable for Xpert Xpress SARS-CoV-2/FLU/RSV testing.  Fact Sheet for Patients: BloggerCourse.com  Fact Sheet for Healthcare Providers: SeriousBroker.it  This test is not yet approved or cleared by the [place] FDA and has been authorized for detection and/or diagnosis of SARS-CoV-2 by FDA under an Emergency Use Authorization (EUA). This EUA will remain in effect (meaning this test can be used) for the duration of the COVID-19 declaration under Section 564(b)(1) of the Act, 21 U.S.C. section 360bbb-3(b)(1),  unless the authorization is terminated or revoked.     Resp Syncytial Virus by PCR NEGATIVE NEGATIVE Final    Comment: (NOTE) Fact Sheet for Patients: BloggerCourse.com  Fact Sheet for Healthcare Providers: SeriousBroker.it  This test is not yet approved or cleared by the [place] FDA and has been authorized for detection and/or diagnosis of SARS-CoV-2 by FDA under an Emergency Use Authorization (EUA). This EUA will remain in effect (meaning this test can be used) for the duration of the COVID-19 declaration under Section 564(b)(1) of the Act, 21 U.S.C. section 360bbb-3(b)(1), unless the authorization is terminated or revoked.  Performed at [facility], [address] [place], [place] [address]   Blood culture (routine x 2)     Status: None (Preliminary result)   Collection Time: 03/13/23 11:22 AM   Specimen: BLOOD  RIGHT FOREARM  Result Value Ref Range Status   Specimen Description BLOOD RIGHT FOREARM  Final   Special Requests   Final    BOTTLES DRAWN AEROBIC AND ANAEROBIC Blood Culture results may not be optimal due to an inadequate volume of blood received in culture bottles   Culture   Final    NO GROWTH < 24 HOURS Performed at [facility], [address] [place], [place] [address]    Report Status PENDING  Incomplete  Expectorated Sputum Assessment w Gram Stain, Rflx to Resp Cult     Status: None   Collection Time: 03/13/23  4:24 PM   Specimen: Sputum  Result Value Ref Range Status   Specimen Description SPUTUM  Final   Special Requests NONE  Final   Sputum evaluation   Final    Sputum specimen not acceptable for testing.  Please recollect.   RESULT CALLED TO, READ BACK BY AND VERIFIED WITH: RN TERESA Performed at [facility], [address], [place], [place] [address]    Report Status 03/14/2023 FINAL  Final         Radiology Studies: CT Angio Chest PE W and/or Wo Contrast  Result Date: 03/13/2023 CLINICAL DATA:  Pulmonary embolism (PE) suspected, high prob EXAM: CT ANGIOGRAPHY CHEST WITH CONTRAST TECHNIQUE: Multidetector CT imaging of the chest was performed using the standard protocol during bolus administration of intravenous contrast. Multiplanar CT image reconstructions and MIPs were obtained to evaluate the vascular anatomy. RADIATION DOSE REDUCTION: This exam was performed according to the departmental dose-optimization program which includes automated exposure control, adjustment of the mA and/or kV according to patient size and/or use of iterative reconstruction technique. CONTRAST:  60mL OMNIPAQUE IOHEXOL 350 MG/ML SOLN COMPARISON:  CT scan chest from 03/16/2022. FINDINGS: Cardiovascular: No evidence of embolism to the proximal subsegmental pulmonary artery level. Mild cardiomegaly. No pericardial effusion. No aortic aneurysm. There are coronary artery  calcifications, in keeping with coronary artery disease. Mediastinum/Nodes: Visualized thyroid gland appears grossly unremarkable. No solid / cystic mediastinal masses. The esophagus is dilated and filled with fluid/debris, which is nonspecific but most commonly seen with esophageal dysmotility or gastroesophageal reflux disease. There is also mild-to-moderate circumferential thickening of the lower thoracic esophagus, which can be seen with esophagitis. There is small-to-moderate sliding hiatal hernia. Findings appear grossly similar to the prior study however, correlate clinically to determine the need for additional evaluation with endoscopy. No axillary, mediastinal or hilar lymphadenopathy by size criteria. Lungs/Pleura: The central tracheo-bronchial tree is patent. There are moderate upper lobe predominant emphysematous changes. There are airspace opacities in the right lung lower lobe with associated trace pleural  effusion, concerning for pneumonia. There are patchy opacities in the dependent left lung lower lobe as well, which may represent pneumonitis versus atelectasis. No mass or pneumothorax. No left pleural effusion. No suspicious lung nodules. Upper Abdomen: Small-to-moderate hiatal hernia again seen. Surgically absent gallbladder. Visualized upper abdominal viscera within normal limits. Musculoskeletal: The visualized soft tissues of the chest wall are grossly unremarkable. No suspicious osseous lesions. There are mild multilevel degenerative changes in the visualized spine. Review of the MIP images confirms the above findings. IMPRESSION: 1. No evidence of pulmonary embolism to the proximal subsegmental pulmonary artery level. 2. Right lung lower lobe pneumonia with associated trace pleural effusion. 3. Small-to-moderate hiatal hernia with dilated esophagus filled with fluid/debris. There is also mild-to-moderate circumferential thickening of the lower thoracic esophagus, which can be seen with  esophagitis. Findings appear grossly similar to the prior study however, correlate clinically to determine the need for additional evaluation with endoscopy. Emphysema (ICD10-J43.9). Electronically Signed   By: Jules Schick M.D.   On: 03/13/2023 14:53        Scheduled Meds:  acidophilus  1 capsule Oral Daily   allopurinol  100 mg Oral q morning   apixaban  5 mg Oral BID   arformoterol  15 mcg Nebulization BID   And   umeclidinium bromide  1 puff Inhalation Daily   atorvastatin  40 mg Oral Daily   azithromycin  500 mg Oral Daily   finasteride  5 mg Oral QHS   gabapentin  300 mg Oral TID   guaiFENesin  1,200 mg Oral BID   insulin aspart  0-15 Units Subcutaneous TID WC   lidocaine-EPINEPHrine  10 mL Intradermal Once   linaclotide  145 mcg Oral q morning   magnesium oxide  400 mg Oral Daily   metoCLOPramide  5 mg Oral TID AC   midodrine  15 mg Oral TID   multivitamin with minerals  1 tablet Oral Daily   pantoprazole  40 mg Oral q morning   predniSONE  40 mg Oral Q breakfast   Riociguat  2 mg Oral TID   Continuous Infusions:  cefTRIAXone (ROCEPHIN)  IV 2 g (03/13/23 2134)     LOS: 0 days      Huey Bienenstock, MD [facility]   To contact the attending provider between 7A-7P or the covering provider during after hours 7P-7A, please log into the web site [email or web address] and access using universal [place] password for that web site. If you do not have the password, please call the hospital operator.  03/14/2023, 2:03 PM

## 2023-03-14 NOTE — Progress Notes (Signed)
Pharmacy Antibiotic Note  Joshua [name] is a 87 y.o. male admitted on 03/13/2023 with pneumonia. Patient with PMH of COPD presented to the ED with worsening cough, wheezing and fever of 103.1. Notably, he was treated for CAP 3 weeks ago with a course of oral antibiotics. Patient was initially treated with vanc and cefepime in the ED, then de-escalated to ceftriaxone and azithromycin. Pharmacy has been consulted for Unasyn dosing in the setting of recurrent pneumonia.   Plan: Discontinue ceftriaxone Start Unasyn 3g IV Q6h Monitor WBC, clinical progression and microbiology results     Temp (24hrs), Avg:99.6 F (37.6 C), Min:98 F (36.7 C), Max:102.4 F (39.1 C)  Recent Labs  Lab 03/13/23 1116 03/13/23 1210 03/13/23 1557 03/13/23 1645 03/14/23 0039  WBC 8.4  --   --   --  6.2  CREATININE 1.52*  --   --   --  1.24  LATICACIDVEN  --  2.5* 1.4 1.6  --     Estimated Creatinine Clearance: 49.5 mL/min (by C-G formula based on SCr of 1.24 mg/dL).    Allergies  Allergen Reactions   Other Swelling    Beer- Swelling    Sunflower Oil Swelling   Sulfa Antibiotics Rash    Antimicrobials this admission: Cefepime 7/25 x1 Vanc 7/25 x1 CTX 7/25 >> 7/26 Azith 7/25 >> (7/30) Unasyn 7/26 >>  Microbiology results: 7/25 BCx: ngtd 7/25 resp panel: negative  Thank you for allowing pharmacy to be a part of this patient's care.  Rennis Petty, PharmD 03/14/2023 2:26 PM

## 2023-03-14 NOTE — TOC Initial Note (Addendum)
Transition of Care [facility]) - Initial/Assessment Note    Patient Details  Name: Joshua [name] MRN: [id number] Date of Birth: 05-03-1936  [facility] CM/SW Contact:    Gordy Clement, RN Phone Number: 03/14/2023, 3:58 PM  Clinical Narrative:     Patient comes from home with pneumonia .  Patient lives with Wife.  Wife is expressing interest in a motorized scooter.  States her Husband has a very hard time walking any distance and gets very out of breath. CM will notify therapies of this request .   Anticipate patient to return home.  If home health is recommended, Patient will need to be given choice. He has had [facility] in the past but not sure if he wants again   [facility] will continue to follow patient for any additional discharge needs   UPDATE  : 4:34 PM   CM requested PT eval- Provider has placed order.  Patient's Wife was notified by CM that request for scooter will have to go through patient's PCP           Expected Discharge Plan: Home w Home Health Services Barriers to Discharge: Continued Medical Work up   Patient Goals and CMS Choice Patient states their goals for this hospitalization and ongoing recovery are:: go home (Per Wife) CMS Medicare.gov Compare Post Acute Care list provided to:: Patient Choice offered to / list presented to : Patient      Expected Discharge Plan and Services In-house Referral: NA Discharge Planning Services: CM Consult Post Acute Care Choice:  (TBD) Living arrangements for the past 2 months: Single Family Home                 DME Arranged: N/A (Recs TBD) DME Agency: [employer]       HH Arranged: NA (RECS TBD)          Prior Living Arrangements/Services Living arrangements for the past 2 months: Single Family Home Lives with:: Spouse Patient language and need for interpreter reviewed:: Yes Do you feel safe going back to the place where you live?: Yes      Need for Family Participation in Patient Care: Yes  (Comment) Care giver support system in place?: Yes (comment) Current home services: DME (has RW/wheelchair/shower chair/ [facility]SC) Criminal Activity/Legal Involvement Pertinent to Current Situation/Hospitalization: No - Comment as needed  Activities of Daily Living      Permission Sought/Granted            Permission granted to share info w Relationship: CM     Emotional Assessment Appearance:: Other (Comment Required (unable to assess  CM working remotely due to illness) Attitude/Demeanor/Rapport: Unable to Assess Affect (typically observed): Unable to Assess Orientation: :  (unable to assess) Alcohol / Substance Use: Not Applicable Psych Involvement: No (comment)  Admission diagnosis:  Sepsis (HCC) [A41.9] Sepsis due to pneumonia (HCC) [J18.9, A41.9] Pneumonia [J18.9] Patient Active Problem List   Diagnosis Date Noted   Pneumonia 03/14/2023   Sepsis due to pneumonia (HCC) 03/13/2023   CAP (community acquired pneumonia) 10/08/2022   Abnormal weight loss 07/29/2022   Alcohol abuse 07/29/2022   Constipation 07/29/2022   Gastro-esophageal reflux disease with esophagitis, without bleeding 07/29/2022   Gastroduodenitis 07/29/2022   Imaging of gastrointestinal tract abnormal 07/29/2022   Personal history of colonic polyps 07/29/2022   COVID-19 virus infection 05/20/2022   Hypotension 05/20/2022   Stage 3b chronic kidney disease (CKD) (HCC) - baseline SCr 1.5-1.8 05/20/2022   COPD with emphysema (HCC) 03/19/2022  Dyslipidemia    Chronic diastolic CHF (congestive heart failure) (HCC)    Protein-calorie malnutrition, severe (HCC) 12/26/2021   Palliative care by specialist    Abnormal gait 10/05/2021   Gastroesophageal reflux disease without esophagitis 10/05/2021   Hiatal hernia 10/05/2021   Hiccoughs 10/05/2021   Oxygen dependent 10/05/2021   Prediabetes 10/05/2021   Pure hypercholesterolemia 10/05/2021   Gout 10/04/2021   Asymmetrical sensorineural hearing loss  08/29/2021   Nasal dryness 08/29/2021   Ear stuffiness, bilateral 05/09/2021   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   Right heart failure with reduced right ventricular function (HCC) 04/12/2021   OSA (obstructive sleep apnea) 12/07/2020   Aortic atherosclerosis (HCC) 12/07/2020   Pseudophakia, both eyes 06/07/2019   Pulmonary hypertension (HCC) 09/29/2017   Benign prostatic hyperplasia with lower urinary tract symptoms 08/25/2017   Demand ischemia of myocardium 08/25/2017   Arthritis 08/24/2017   Hypertension 02/16/2015   Personal history of pulmonary embolism 10/10/2014   Chronic respiratory failure with hypoxia (HCC) - on home O2 @ 2 L/min 10/10/2014   S/P knee replacement 09/13/2014   PCP:  [name], DO Pharmacy:   [facility] [phone] [place] [place] - [address] [place] [address] Phone: [phone] Fax: [phone]  [facility] [place] - [address] [place] [address] Phone: [phone] Fax: [phone]     Social Determinants of Health (SDOH) Social History: SDOH Screenings   Food Insecurity: Patient Declined (07/18/2022)  Housing: Low Risk  (07/18/2022)  Transportation Needs: No Transportation Needs (07/18/2022)  Utilities: Not At Risk (07/18/2022)  Depression (PHQ2-9): Low Risk  (03/13/2022)  Stress: No Stress Concern Present (05/09/2021)  Tobacco Use: Medium Risk (03/07/2023)   SDOH Interventions:     Readmission Risk Interventions    10/05/2021    3:15 PM  Readmission Risk Prevention Plan  Transportation Screening Complete  Medication Review (RN Care Manager) Complete  PCP or Specialist appointment within 3-5 days of discharge Complete  HRI or Home Care Consult Complete  SW Recovery Care/Counseling Consult Complete  Palliative Care Screening Not Applicable  Skilled Nursing Facility Not Applicable

## 2023-03-15 DIAGNOSIS — J189 Pneumonia, unspecified organism: Secondary | ICD-10-CM | POA: Diagnosis not present

## 2023-03-15 NOTE — Progress Notes (Signed)
PROGRESS NOTE    Joshua [name]  ZOX:[id number] DOB: March 28, 1936 DOA: 03/13/2023 PCP: [name], DO  Chief Complaint  Patient presents with   Weakness   Shoulder Pain    Left    Brief Narrative:   Joshua [name] is a 87 y.o. male with medical history significant of COPD Gold stage III, restrictive lung disease, pulmonary hypertension, cor pulmonale, chronic hypoxic respiratory failure on 2 L continuously,, chronic HFpEF, chronic orthostatic hypotension on midodrine, IIDM, GERD, BPH with chronic bladder outlet obstruction and self cathetering 3 times daily, on Eliquis, presented with worsening of cough, wheezing, generalized weakness.   Patient traveled to [place] 3 weeks ago and was feeling sick x 1 day and went to a local hospital, when he was diagnosed with CAP and COPD exacerbation, was treated with antibiotics and 7 days of p.o. antibiotics plus tapering of prednisone for 10 days.  Last night, patient started to have increasing of coughing, dry, denies any fever chills denied any chest pains.  This morning, patient became very weak, with increasing wheezing and left-sided shoulder pain and came to the hospital.  He denies any diarrhea, no urinary problems no rash or any injury.  At baseline, patient has chronic hypotension on midodrine, baseline SBP runs 95-100   ED Course: Fever 103.1, borderline tachycardia, blood pressure 95/51, and patient received 2.5 L IV bolus, SBP increased to 99.  Blood work showed creatinine 1.5, WBC 8.4, hemoglobin 13.1.  CT chest showed right lower lobe pneumonia.   Patient was given vancomycin and cefepime in the ED Assessment & Plan:   Principal Problem:   Pneumonia Active Problems:   Chronic diastolic CHF (congestive heart failure) (HCC)   CAP (community acquired pneumonia)   Sepsis due to pneumonia (HCC)   Sepsis, due to aspiration pneumonia RLL bacterial pneumonia -His criteria met on admission, tachypneic, tachycardic, febrile 102.4 with  elevated lactic acid (hypotension unclear if related to sepsis as patient with chronic hypotension, but he does meet criteria for sepsis and dependent of hypertension) -Right lower lobe infiltrate suspicious for aspiration pneumonia, this is most likely in the setting of his hiatal hernia contributing to recurrent aspiration events, especially as discussed with him he is with increased cough at nighttime and upon laying flat -I have discussed with him elevation of the bed, not eating late before laying flat and other techniques to avoid aspiration in the setting of hiatal hernia, will increase his Protonix to 40 mg twice daily -Continue with azithromycin, Rocephin has been changed to Unasyn for better aspiration pneumonia coverage    Acute COPD exacerbation -Short course of p.o. steroids no wheezing has improved, continue with p.o. steroids, will aim for rapid taper -DuoNebs -Continue ICS and LABA   Chronic hypotension -Blood pressure above his baseline, received IV bolus in the ED, will hold off maintenance IV fluid -Hold off diuresis -Continue midodrine   History of chronic HFpEF Pulmonary hypertension Cor pulmonale -Euvolemic, hold off diuresis today   BPH with chronic bladder obstruction -Continue self catheterization, ordered set placed -UA is negative   IIDM -SSI   Hx of PE -CTA negative for PE -Continue Eliquis    DVT prophylaxis: on Eliquis Code Status: Full Family Communication: None at bedside Disposition:   Status is:   Consultants:  None  Subjective:  Reports some cough  Objective: Vitals:   03/15/23 0913 03/15/23 0914 03/15/23 0915 03/15/23 1203  BP:  (!) 97/51  (!) 97/54  Pulse: 66 67 65   Resp:  12 (!) 21 15   Temp: 98.5 F (36.9 C)   98.2 F (36.8 C)  TempSrc:    Oral  SpO2: 94% 94% 94%     Intake/Output Summary (Last 24 hours) at 03/15/2023 1438 Last data filed at 03/15/2023 8295 Gross per 24 hour  Intake 540 ml  Output 550 ml  Net -10 ml    There were no vitals filed for this visit.  Examination:  Awake Alert, Oriented X 3, No new F.N deficits, Normal affect Symmetrical Chest wall movement, Good air movement bilaterally, right lung base Rales RRR,No Gallops,Rubs or new Murmurs, No Parasternal Heave +ve B.Sounds, Abd Soft, No tenderness, No rebound - guarding or rigidity. No Cyanosis, Clubbing or edema, No new Rash or bruise        Data Reviewed: I have personally reviewed following labs and imaging studies  CBC: Recent Labs  Lab 03/13/23 1116 03/14/23 0039 03/15/23 0430  WBC 8.4 6.2 8.7  NEUTROABS 7.5  --  7.2  HGB 13.1 11.4* 11.7*  HCT 42.4 36.0* 36.3*  MCV 87.6 86.3 83.6  PLT 151 135* 149*    Basic Metabolic Panel: Recent Labs  Lab 03/13/23 1116 03/14/23 0039 03/15/23 0430  NA 139 136 138  K 4.0 3.5 4.1  CL 94* 96* 101  CO2 31 32 28  GLUCOSE 100* 117* 96  BUN 20 18 22   CREATININE 1.52* 1.24 1.08  CALCIUM 9.4 9.1 9.7  MG  --   --  1.7  PHOS  --   --  3.0    GFR: Estimated Creatinine Clearance: 56.8 mL/min (by C-G formula based on SCr of 1.08 mg/dL).  Liver Function Tests: Recent Labs  Lab 03/13/23 1116  AST 39  ALT 20  ALKPHOS 85  BILITOT 1.3*  PROT 7.2  ALBUMIN 3.7    CBG: Recent Labs  Lab 03/13/23 1701 03/13/23 2137 03/14/23 0736  GLUCAP 94 99 105*     Recent Results (from the past 240 hour(s))  Blood culture (routine x 2)     Status: None (Preliminary result)   Collection Time: 03/13/23 11:17 AM   Specimen: BLOOD  Result Value Ref Range Status   Specimen Description BLOOD LEFT ANTECUBITAL  Final   Special Requests   Final    BOTTLES DRAWN AEROBIC AND ANAEROBIC Blood Culture results may not be optimal due to an inadequate volume of blood received in culture bottles   Culture   Final    NO GROWTH 2 DAYS Performed at [facility], [address], [place], [place] [address]    Report Status PENDING  Incomplete  Resp panel by RT-PCR (RSV, Flu A&B, Covid)      Status: None   Collection Time: 03/13/23 11:18 AM   Specimen: Nasal Swab  Result Value Ref Range Status   SARS Coronavirus 2 by RT PCR NEGATIVE NEGATIVE Final   Influenza A by PCR NEGATIVE NEGATIVE Final   Influenza B by PCR NEGATIVE NEGATIVE Final    Comment: (NOTE) The Xpert Xpress SARS-CoV-2/FLU/RSV plus assay is intended as an aid in the diagnosis of influenza from Nasopharyngeal swab specimens and should not be used as a sole basis for treatment. Nasal washings and aspirates are unacceptable for Xpert Xpress SARS-CoV-2/FLU/RSV testing.  Fact Sheet for Patients: BloggerCourse.com  Fact Sheet for Healthcare Providers: SeriousBroker.it  This test is not yet approved or cleared by the [place] FDA and has been authorized for detection and/or diagnosis of SARS-CoV-2 by FDA under an Emergency  Use Authorization (EUA). This EUA will remain in effect (meaning this test can be used) for the duration of the COVID-19 declaration under Section 564(b)(1) of the Act, 21 U.S.C. section 360bbb-3(b)(1), unless the authorization is terminated or revoked.     Resp Syncytial Virus by PCR NEGATIVE NEGATIVE Final    Comment: (NOTE) Fact Sheet for Patients: BloggerCourse.com  Fact Sheet for Healthcare Providers: SeriousBroker.it  This test is not yet approved or cleared by the [place] FDA and has been authorized for detection and/or diagnosis of SARS-CoV-2 by FDA under an Emergency Use Authorization (EUA). This EUA will remain in effect (meaning this test can be used) for the duration of the COVID-19 declaration under Section 564(b)(1) of the Act, 21 U.S.C. section 360bbb-3(b)(1), unless the authorization is terminated or revoked.  Performed at [facility], [address], [place], [place] [address]   Blood culture (routine x 2)     Status: None (Preliminary result)    Collection Time: 03/13/23 11:22 AM   Specimen: BLOOD RIGHT FOREARM  Result Value Ref Range Status   Specimen Description BLOOD RIGHT FOREARM  Final   Special Requests   Final    BOTTLES DRAWN AEROBIC AND ANAEROBIC Blood Culture results may not be optimal due to an inadequate volume of blood received in culture bottles   Culture   Final    NO GROWTH 2 DAYS Performed at [facility], [address] [place], [place] [address]    Report Status PENDING  Incomplete  Expectorated Sputum Assessment w Gram Stain, Rflx to Resp Cult     Status: None   Collection Time: 03/13/23  4:24 PM   Specimen: Sputum  Result Value Ref Range Status   Specimen Description SPUTUM  Final   Special Requests NONE  Final   Sputum evaluation   Final    Sputum specimen not acceptable for testing.  Please recollect.   RESULT CALLED TO, READ BACK BY AND VERIFIED WITH: RN TERESA Performed at [facility], [address] [place], [place] [address]    Report Status 03/14/2023 FINAL  Final         Radiology Studies: No results found.      Scheduled Meds:  acidophilus  1 capsule Oral Daily   allopurinol  100 mg Oral q morning   apixaban  5 mg Oral BID   arformoterol  15 mcg Nebulization BID   And   umeclidinium bromide  1 puff Inhalation Daily   atorvastatin  40 mg Oral Daily   azithromycin  500 mg Oral Daily   finasteride  5 mg Oral QHS   gabapentin  300 mg Oral TID   guaiFENesin  1,200 mg Oral BID   insulin aspart  0-15 Units Subcutaneous TID WC   lidocaine-EPINEPHrine  10 mL Intradermal Once   linaclotide  145 mcg Oral q morning   magnesium oxide  400 mg Oral Daily   metoCLOPramide  5 mg Oral TID AC   midodrine  15 mg Oral TID   multivitamin with minerals  1 tablet Oral Daily   pantoprazole  40 mg Oral BID   predniSONE  20 mg Oral Q breakfast   Riociguat  2 mg Oral TID   Continuous Infusions:  ampicillin-sulbactam (UNASYN) IV 3 g (03/15/23 0908)     LOS: 1 day      Huey Bienenstock, MD [facility]   To contact the attending provider between 7A-7P or the covering provider during  after hours 7P-7A, please log into the web site [email or web address] and access using universal [place] password for that web site. If you do not have the password, please call the hospital operator.  03/15/2023, 2:38 PM

## 2023-03-15 NOTE — Evaluation (Signed)
Physical Therapy Evaluation Patient Details Name: Joshua [name] MRN: [id number] DOB: 08/03/1936 Today's Date: 03/15/2023  History of Present Illness  87 y.o. male presents to [facility] hospital on 03/13/2023 with cough, wheezing and weakness. Pt admitted for management of RLL PNA and COPD exacerbation. PMH includes: HTN, CAD, chronic thrombolytic pulmonary hypertension, PE on anticoagulation, CHF, COPD.  Clinical Impression  Pt presents to PT with deficits in activity tolerance and cardiopulmonary function. Pt ambulates for limited community distances and demonstrates good balance. Pt reports mild DOE when ambulating, with desaturation noted on 2 and 3L [place]. PT encourages pursed lip breathing and frequent mobilization in an effort to improve activity tolerance. PT notes cas manager note which indicates the pt's spouse requests an electric scooter for the patient. PT has concerns that if the pt begins to utilize an Art gallery manager for community mobility that his endurance will decline quickly, as community ambulation is likely his only form of exercise currently. PT suggests use of a rollator instead to allow for energy conservation and continued ambulation.        Assistance Recommended at Discharge PRN  If plan is discharge home, recommend the following:  Can travel by private vehicle  Assistance with Programmer, systems (4 wheels)  Recommendations for Other Services       Functional Status Assessment Patient has had a recent decline in their functional status and demonstrates the ability to make significant improvements in function in a reasonable and predictable amount of time.     Precautions / Restrictions Precautions Precautions: Other (comment) Precaution Comments: monitor sats Restrictions Weight Bearing Restrictions: No      Mobility  Bed Mobility Overal bed mobility: Independent                  Transfers Overall transfer  level: Independent                      Ambulation/Gait Ambulation/Gait assistance: Supervision Gait Distance (Feet): 400 Feet Assistive device: None Gait Pattern/deviations: Step-through pattern Gait velocity: functional Gait velocity interpretation: 1.31 - 2.62 ft/sec, indicative of limited community ambulator   General Gait Details: steady step-through gait, one standing rest break due to desaturation  Stairs            Wheelchair Mobility     Tilt Bed    Modified Rankin (Stroke Patients Only)       Balance Overall balance assessment: Needs assistance Sitting-balance support: No upper extremity supported, Feet supported Sitting balance-Leahy Scale: Good     Standing balance support: During functional activity, No upper extremity supported Standing balance-Leahy Scale: Good                               Pertinent Vitals/Pain Pain Assessment Pain Assessment: No/denies pain    Home Living Family/patient expects to be discharged to:: Private residence Living Arrangements: Spouse/significant other Available Help at Discharge: Family Type of Home: House Home Access: Ramped entrance     Alternate Level Stairs-Number of Steps: 6+landing+6+landing+4 Home Layout: Two level;Bed/bath upstairs Home Equipment: Agricultural consultant (2 wheels);Cane - single point;BSC/3in1;Shower seat      Prior Function Prior Level of Function : Independent/Modified Independent;Driving             Mobility Comments: ambulatory without DME, utilizes oxygen for community mobility and PRN within the home       Hand Dominance  Extremity/Trunk Assessment   Upper Extremity Assessment Upper Extremity Assessment: Overall WFL for tasks assessed    Lower Extremity Assessment Lower Extremity Assessment: Overall WFL for tasks assessed    Cervical / Trunk Assessment Cervical / Trunk Assessment: Normal  Communication   Communication: No difficulties   Cognition Arousal/Alertness: Awake/alert Behavior During Therapy: WFL for tasks assessed/performed Overall Cognitive Status: Within Functional Limits for tasks assessed                                          General Comments General comments (skin integrity, edema, etc.): pt on 2L [place] at rest, pt desats to low 80s on 2L [place]. On 3L [place] pt appears to maintain sats in low 90s when pleth has a reliable reading. Pt demonstrates an increase in RR but denies SOB or dyspnea when mobilizing. At end of ambulation on 3L sats to drop to 83%, pt takes ~2 minutes to recover to low 90s.    Exercises     Assessment/Plan    PT Assessment Patient needs continued PT services  PT Problem List Decreased activity tolerance;Cardiopulmonary status limiting activity       PT Treatment Interventions Gait training;Functional mobility training;Therapeutic exercise;Therapeutic activities;Patient/family education    PT Goals (Current goals can be found in the Care Plan section)  Acute Rehab PT Goals Patient Stated Goal: to go home PT Goal Formulation: With patient Time For Goal Achievement: 03/29/23 Potential to Achieve Goals: Good Additional Goals Additional Goal #1: Pt will report 0/4 DOE when ambulating for >250' to demonstrate improvement in activity tolerance Additional Goal #2: Pt will score >19/24 on the DGI to indicate a reduced risk for falls    Frequency Min 1X/week     Co-evaluation               AM-PAC PT "6 Clicks" Mobility  Outcome Measure Help needed turning from your back to your side while in a flat bed without using bedrails?: None Help needed moving from lying on your back to sitting on the side of a flat bed without using bedrails?: None Help needed moving to and from a bed to a chair (including a wheelchair)?: None Help needed standing up from a chair using your arms (e.g., wheelchair or bedside chair)?: None Help needed to walk in hospital room?: A  Little Help needed climbing 3-5 steps with a railing? : A Little 6 Click Score: 22    End of Session Equipment Utilized During Treatment: Oxygen Activity Tolerance: Patient tolerated treatment well Patient left: in chair;with call bell/phone within reach;with chair alarm set Nurse Communication: Mobility status PT Visit Diagnosis: Other abnormalities of gait and mobility (R26.89)    Time: 0347-4259 PT Time Calculation (min) (ACUTE ONLY): 24 min   Charges:   PT Evaluation $PT Eval Low Complexity: 1 Low   PT General Charges $$ ACUTE PT VISIT: 1 Visit         Arlyss Gandy, PT, DPT Acute Rehabilitation Office [phone]   Arlyss Gandy 03/15/2023, 10:43 AM

## 2023-03-16 DIAGNOSIS — K219 Gastro-esophageal reflux disease without esophagitis: Secondary | ICD-10-CM | POA: Diagnosis not present

## 2023-03-16 DIAGNOSIS — J189 Pneumonia, unspecified organism: Secondary | ICD-10-CM | POA: Diagnosis not present

## 2023-03-16 DIAGNOSIS — A419 Sepsis, unspecified organism: Secondary | ICD-10-CM | POA: Diagnosis not present

## 2023-03-16 MED ORDER — METHYLPREDNISOLONE 4 MG PO TBPK: ORAL_TABLET | ORAL | 0 refills | Status: DC

## 2023-03-16 MED ORDER — AMOXICILLIN-POT CLAVULANATE 875-125 MG PO TABS: 1.0000 | ORAL_TABLET | Freq: Two times a day (BID) | ORAL | 0 refills | Status: DC

## 2023-03-16 NOTE — TOC Transition Note (Addendum)
Transition of Care [facility]) - CM/SW Discharge Note   Patient Details  Name: Joshua [name] MRN: [id number] Date of Birth: 07/19/1936  [facility] CM/SW Contact:  Lawerance Sabal, RN Phone Number: 03/16/2023, 9:32 AM   Clinical Narrative:     Called pt's room, no answer, LVM w wife. Waiting to hear back if they would like a rollator, however patient ambulated 400 ft w/o assistive device w PT, ok to DC w/o it.   Spoke with wife, she would like rollator. I have ordered one to be delivered to the room prior to DC     Barriers to Discharge: Continued Medical Work up   Patient Goals and CMS Choice CMS Medicare.gov Compare Post Acute Care list provided to:: Patient Choice offered to / list presented to : Patient  Discharge Placement                         Discharge Plan and Services Additional resources added to the After Visit Summary for   In-house Referral: NA Discharge Planning Services: CM Consult Post Acute Care Choice:  (TBD)          DME Arranged: N/A (Recs TBD) DME Agency: [employer]       HH Arranged: NA (RECS TBD)          Social Determinants of Health (SDOH) Interventions SDOH Screenings   Food Insecurity: Patient Declined (07/18/2022)  Housing: Low Risk  (07/18/2022)  Transportation Needs: No Transportation Needs (07/18/2022)  Utilities: Not At Risk (07/18/2022)  Depression (PHQ2-9): Low Risk  (03/13/2022)  Stress: No Stress Concern Present (05/09/2021)  Tobacco Use: Medium Risk (03/07/2023)     Readmission Risk Interventions    10/05/2021    3:15 PM  Readmission Risk Prevention Plan  Transportation Screening Complete  Medication Review (RN Care Manager) Complete  PCP or Specialist appointment within 3-5 days of discharge Complete  HRI or Home Care Consult Complete  SW Recovery Care/Counseling Consult Complete  Palliative Care Screening Not Applicable  Skilled Nursing Facility Not Applicable

## 2023-03-16 NOTE — Plan of Care (Signed)
  Problem: Activity: Goal: Ability to tolerate increased activity will improve Outcome: Progressing   Problem: Respiratory: Goal: Ability to maintain adequate ventilation will improve Outcome: Progressing   

## 2023-03-16 NOTE — Discharge Summary (Signed)
Physician Discharge Summary  [name]:[id number] DOB: 1936/04/13 DOA: 03/13/2023  PCP: [name], DO  Admit date: 03/13/2023 Discharge date: 03/16/2023  Admitted From: (Home) Disposition:  (Home)  Recommendations for Outpatient Follow-up:  Follow up with PCP in 1-2 weeks Please obtain BMP/CBC in one week  Diet recommendation: Heart Healthy [name] encouraged not to eat late at night, and to stay in seated position 2 hours after every meal to avoid aspiration from his hiatal hernia.  Brief/Interim Summary: Joshua [name] is a 87 y.o. male with medical history significant of COPD Gold stage III, restrictive lung disease, pulmonary hypertension, cor pulmonale, chronic hypoxic respiratory failure on 2 L continuously,, chronic HFpEF, chronic orthostatic hypotension on midodrine, IIDM, GERD, BPH with chronic bladder outlet obstruction and self cathetering 3 times daily, on Eliquis, presented with worsening of cough, wheezing, generalized weakness.  Was recently hospitalized in [place] secondary to pneumonia where he spent 1 day in the hospital, in ED patient with fever 103.1, borderline tachycardia, blood pressure 95/51, and patient received 2.5 L IV bolus, SBP increased to 99.  Blood work showed creatinine 1.5, WBC 8.4, hemoglobin 13.1.  CT chest showed right lower lobe pneumonia.     Sepsis, present on admission, due to aspiration pneumonia RLL bacterial pneumonia -Sepsis criteria met on admission, tachypneic, tachycardic, febrile 103.1 with elevated lactic acid (hypotension unclear if related to sepsis as patient with chronic hypotension, but he does meet criteria for sepsis and dependent of hypertension) -Right lower lobe infiltrate suspicious for aspiration pneumonia, this is most likely in the setting of his hiatal hernia contributing to recurrent aspiration events, especially as discussed with him he is with increased cough at nighttime and upon laying flat -I have discussed  with him elevation of the bed, not eating late before laying flat and other techniques to avoid aspiration in the setting of hiatal hernia, will increase his Protonix to 40 mg twice daily -Was treated with azithromycin during hospital stay, Rocephin changed to Unasyn for better aspiration pneumonia coverage, he will be discharged on Augmentin.     Acute COPD exacerbation Chronic hypoxic respiratory failure on 2 L nasal cannula at baseline -Some wheezing upon presentation, he was kept on short course steroids, this has resolved at time of discharge.  Will be discharged on Medrol pack -Continue ICS and LABA   Hiatal hernia, reflux -With hiatal hernia on the scan, this is likely puts him at higher risk for recurrent aspiration, continue with PPI, his wife were counseled about lifestyle modifications to minimize risk of aspiration  Chronic hypotension -Continue midodrine   History of chronic HFpEF Pulmonary hypertension Cor pulmonale -Euvolemic   BPH with chronic bladder obstruction -Continue self catheterization, ordered set placed -UA is negative   IIDM -SSI in hospital stay   Hx of PE -CTA negative for PE -Continue Eliquis    Discharge Diagnoses:  Principal Problem:   Pneumonia Active Problems:   Chronic diastolic CHF (congestive heart failure) (HCC)   CAP (community acquired pneumonia)   Sepsis due to pneumonia [facility])    Discharge Instructions  Discharge Instructions     Diet - low sodium heart healthy   Complete by: As directed    Discharge instructions   Complete by: As directed    Follow with Primary MD [name], DO in 7 days   Get CBC, CMP, 2 view Chest X ray checked  by Primary MD next visit.    Activity: As tolerated with Full fall precautions use walker/cane &  assistance as needed   Disposition Home    Diet: Heart Healthy  On your next visit with your primary care physician please Get Medicines reviewed and adjusted.   Please request your  Prim.MD to go over all Hospital Tests and Procedure/Radiological results at the follow up, please get all Hospital records sent to your Prim MD by signing hospital release before you go home.   If you experience worsening of your admission symptoms, develop shortness of breath, life threatening emergency, suicidal or homicidal thoughts you must seek medical attention immediately by calling 911 or calling your MD immediately  if symptoms less severe.  You Must read complete instructions/literature along with all the possible adverse reactions/side effects for all the Medicines you take and that have been prescribed to you. Take any new Medicines after you have completely understood and accpet all the possible adverse reactions/side effects.   Do not drive, operating heavy machinery, perform activities at heights, swimming or participation in water activities or provide baby sitting services if your were admitted for syncope or siezures until you have seen by Primary MD or a Neurologist and advised to do so again.  Do not drive when taking Pain medications.    Do not take more than prescribed Pain, Sleep and Anxiety Medications  Special Instructions: If you have smoked or chewed Tobacco  in the last 2 yrs please stop smoking, stop any regular Alcohol  and or any Recreational drug use.  Wear Seat belts while driving.   Please note  You were cared for by a hospitalist during your hospital stay. If you have any questions about your discharge medications or the care you received while you were in the hospital after you are discharged, you can call the unit and asked to speak with the hospitalist on call if the hospitalist that took care of you is not available. Once you are discharged, your primary care physician will handle any further medical issues. Please note that NO REFILLS for any discharge medications will be authorized once you are discharged, as it is imperative that you return to your  primary care physician (or establish a relationship with a primary care physician if you do not have one) for your aftercare needs so that they can reassess your need for medications and monitor your lab values.   Increase activity slowly   Complete by: As directed       Allergies as of 03/16/2023       Reactions   Other Swelling   Beer- Swelling    Sunflower Oil Swelling   Sulfa Antibiotics Rash        Medication List     TAKE these medications    albuterol 108 (90 Base) MCG/ACT inhaler Commonly known as: VENTOLIN HFA Inhale 1 puff into the lungs every 4 (four) hours as needed for shortness of breath.   allopurinol 100 MG tablet Commonly known as: ZYLOPRIM Take 100 mg by mouth every morning.   amoxicillin-clavulanate 875-125 MG tablet Commonly known as: AUGMENTIN Take 1 tablet by mouth 2 (two) times daily.   atorvastatin 40 MG tablet Commonly known as: LIPITOR TAKE 1 TABLET BY MOUTH EVERY DAY   Cinnamon 500 MG capsule Take 500 mg by mouth every morning.   dapagliflozin propanediol 10 MG Tabs tablet Commonly known as: Farxiga Take 1 tablet (10 mg total) by mouth daily before breakfast.   Eliquis 5 MG Tabs tablet Generic drug: apixaban TAKE 1 TABLET BY MOUTH TWICE A DAY What changed: how  much to take   finasteride 5 MG tablet Commonly known as: PROSCAR Take 5 mg by mouth at bedtime.   gabapentin 300 MG capsule Commonly known as: NEURONTIN Take 300 mg by mouth 3 (three) times daily.   magnesium oxide 400 (240 Mg) MG tablet Commonly known as: MAG-OX TAKE 1 TABLET BY MOUTH EVERY DAY   metoCLOPramide 5 MG tablet Commonly known as: REGLAN Take 5 mg by mouth 3 (three) times daily before meals.   midodrine 5 MG tablet Commonly known as: PROAMATINE Take 3 tablets (15 mg total) by mouth 3 (three) times daily.   multivitamin with minerals Tabs tablet Take 1 tablet by mouth daily.   OXYGEN Inhale 2 L into the lungs continuous.   pantoprazole 40 MG  tablet Commonly known as: PROTONIX Take 1 tablet (40 mg total) by mouth every morning.   Riociguat 2 MG Tabs Take 2 mg by mouth in the morning, at noon, and at bedtime.   Stiolto Respimat 2.5-2.5 MCG/ACT Aers Generic drug: Tiotropium Bromide-Olodaterol Inhale 2 puffs into the lungs daily.   torsemide 20 MG tablet Commonly known as: DEMADEX Take 20 mg by mouth daily.               Durable Medical Equipment  (From admission, onward)           Start     Ordered   03/16/23 0959  For home use only DME 4 wheeled rolling walker with seat  Once       Question:  Patient needs a walker to treat with the following condition  Answer:  Weakness   03/16/23 0959            Allergies  Allergen Reactions   Other Swelling    Beer- Swelling    Sunflower Oil Swelling   Sulfa Antibiotics Rash    Consultations: NONE   Procedures/Studies: CT Angio Chest PE W and/or Wo Contrast  Result Date: 03/13/2023 CLINICAL DATA:  Pulmonary embolism (PE) suspected, high prob EXAM: CT ANGIOGRAPHY CHEST WITH CONTRAST TECHNIQUE: Multidetector CT imaging of the chest was performed using the standard protocol during bolus administration of intravenous contrast. Multiplanar CT image reconstructions and MIPs were obtained to evaluate the vascular anatomy. RADIATION DOSE REDUCTION: This exam was performed according to the departmental dose-optimization program which includes automated exposure control, adjustment of the mA and/or kV according to patient size and/or use of iterative reconstruction technique. CONTRAST:  60mL OMNIPAQUE IOHEXOL 350 MG/ML SOLN COMPARISON:  CT scan chest from 03/16/2022. FINDINGS: Cardiovascular: No evidence of embolism to the proximal subsegmental pulmonary artery level. Mild cardiomegaly. No pericardial effusion. No aortic aneurysm. There are coronary artery calcifications, in keeping with coronary artery disease. Mediastinum/Nodes: Visualized thyroid gland appears grossly  unremarkable. No solid / cystic mediastinal masses. The esophagus is dilated and filled with fluid/debris, which is nonspecific but most commonly seen with esophageal dysmotility or gastroesophageal reflux disease. There is also mild-to-moderate circumferential thickening of the lower thoracic esophagus, which can be seen with esophagitis. There is small-to-moderate sliding hiatal hernia. Findings appear grossly similar to the prior study however, correlate clinically to determine the need for additional evaluation with endoscopy. No axillary, mediastinal or hilar lymphadenopathy by size criteria. Lungs/Pleura: The central tracheo-bronchial tree is patent. There are moderate upper lobe predominant emphysematous changes. There are airspace opacities in the right lung lower lobe with associated trace pleural effusion, concerning for pneumonia. There are patchy opacities in the dependent left lung lower lobe as well, which may represent  pneumonitis versus atelectasis. No mass or pneumothorax. No left pleural effusion. No suspicious lung nodules. Upper Abdomen: Small-to-moderate hiatal hernia again seen. Surgically absent gallbladder. Visualized upper abdominal viscera within normal limits. Musculoskeletal: The visualized soft tissues of the chest wall are grossly unremarkable. No suspicious osseous lesions. There are mild multilevel degenerative changes in the visualized spine. Review of the MIP images confirms the above findings. IMPRESSION: 1. No evidence of pulmonary embolism to the proximal subsegmental pulmonary artery level. 2. Right lung lower lobe pneumonia with associated trace pleural effusion. 3. Small-to-moderate hiatal hernia with dilated esophagus filled with fluid/debris. There is also mild-to-moderate circumferential thickening of the lower thoracic esophagus, which can be seen with esophagitis. Findings appear grossly similar to the prior study however, correlate clinically to determine the need for  additional evaluation with endoscopy. Emphysema (ICD10-J43.9). Electronically Signed   By: Jules Schick M.D.   On: 03/13/2023 14:53     Subjective: Significant events overnight, he denies any complaints today, eager to go home  Discharge Exam: Vitals:   03/16/23 0800 03/16/23 0816  BP:    Pulse:    Resp:    Temp: 97.6 F (36.4 C) (!) 97.4 F (36.3 C)  SpO2:     Vitals:   03/15/23 2300 03/16/23 0317 03/16/23 0800 03/16/23 0816  BP: 119/68 (!) 118/59    Pulse: 63 76    Resp: 13 19    Temp: 98.2 F (36.8 C) 98 F (36.7 C) 97.6 F (36.4 C) (!) 97.4 F (36.3 C)  TempSrc: Oral Oral Oral Oral  SpO2: 93% 98%      General: Pt is alert, awake, not in acute distress Cardiovascular: RRR, S1/S2 +, no rubs, no gallops Respiratory: Rales at the bases, much improved, no wheezing today Abdominal: Soft, NT, ND, bowel sounds + Extremities: no edema, no cyanosis    The results of significant diagnostics from this hospitalization (including imaging, microbiology, ancillary and laboratory) are listed below for reference.     Microbiology: Recent Results (from the past 240 hour(s))  Blood culture (routine x 2)     Status: None (Preliminary result)   Collection Time: 03/13/23 11:17 AM   Specimen: BLOOD  Result Value Ref Range Status   Specimen Description BLOOD LEFT ANTECUBITAL  Final   Special Requests   Final    BOTTLES DRAWN AEROBIC AND ANAEROBIC Blood Culture results may not be optimal due to an inadequate volume of blood received in culture bottles   Culture   Final    NO GROWTH 3 DAYS Performed at [facility], [address], [place], [place] [address]    Report Status PENDING  Incomplete  Resp panel by RT-PCR (RSV, Flu A&B, Covid)     Status: None   Collection Time: 03/13/23 11:18 AM   Specimen: Nasal Swab  Result Value Ref Range Status   SARS Coronavirus 2 by RT PCR NEGATIVE NEGATIVE Final   Influenza A by PCR NEGATIVE NEGATIVE Final   Influenza B by PCR  NEGATIVE NEGATIVE Final    Comment: (NOTE) The Xpert Xpress SARS-CoV-2/FLU/RSV plus assay is intended as an aid in the diagnosis of influenza from Nasopharyngeal swab specimens and should not be used as a sole basis for treatment. Nasal washings and aspirates are unacceptable for Xpert Xpress SARS-CoV-2/FLU/RSV testing.  Fact Sheet for Patients: BloggerCourse.com  Fact Sheet for Healthcare Providers: SeriousBroker.it  This test is not yet approved or cleared by the [place] FDA and has been authorized for detection and/or  diagnosis of SARS-CoV-2 by FDA under an Emergency Use Authorization (EUA). This EUA will remain in effect (meaning this test can be used) for the duration of the COVID-19 declaration under Section 564(b)(1) of the Act, 21 U.S.C. section 360bbb-3(b)(1), unless the authorization is terminated or revoked.     Resp Syncytial Virus by PCR NEGATIVE NEGATIVE Final    Comment: (NOTE) Fact Sheet for Patients: BloggerCourse.com  Fact Sheet for Healthcare Providers: SeriousBroker.it  This test is not yet approved or cleared by the [place] FDA and has been authorized for detection and/or diagnosis of SARS-CoV-2 by FDA under an Emergency Use Authorization (EUA). This EUA will remain in effect (meaning this test can be used) for the duration of the COVID-19 declaration under Section 564(b)(1) of the Act, 21 U.S.C. section 360bbb-3(b)(1), unless the authorization is terminated or revoked.  Performed at [facility], [address], [place], [place] [address]   Blood culture (routine x 2)     Status: None (Preliminary result)   Collection Time: 03/13/23 11:22 AM   Specimen: BLOOD RIGHT FOREARM  Result Value Ref Range Status   Specimen Description BLOOD RIGHT FOREARM  Final   Special Requests   Final    BOTTLES DRAWN AEROBIC AND ANAEROBIC Blood Culture  results may not be optimal due to an inadequate volume of blood received in culture bottles   Culture   Final    NO GROWTH 3 DAYS Performed at [facility], [address], [place], [place] [address]    Report Status PENDING  Incomplete  Expectorated Sputum Assessment w Gram Stain, Rflx to Resp Cult     Status: None   Collection Time: 03/13/23  4:24 PM   Specimen: Sputum  Result Value Ref Range Status   Specimen Description SPUTUM  Final   Special Requests NONE  Final   Sputum evaluation   Final    Sputum specimen not acceptable for testing.  Please recollect.   RESULT CALLED TO, READ BACK BY AND VERIFIED WITH: RN TERESA Performed at [facility], [address], [place], [place] [address]    Report Status 03/14/2023 FINAL  Final     Labs: BNP (last 3 results) Recent Labs    12/18/22 1515 02/25/23 1142 03/13/23 1116  BNP 115.2* 134.1* 173.5*   Basic Metabolic Panel: Recent Labs  Lab 03/13/23 1116 03/14/23 0039 03/15/23 0430  NA 139 136 138  K 4.0 3.5 4.1  CL 94* 96* 101  CO2 31 32 28  GLUCOSE 100* 117* 96  BUN 20 18 22   CREATININE 1.52* 1.24 1.08  CALCIUM 9.4 9.1 9.7  MG  --   --  1.7  PHOS  --   --  3.0   Liver Function Tests: Recent Labs  Lab 03/13/23 1116  AST 39  ALT 20  ALKPHOS 85  BILITOT 1.3*  PROT 7.2  ALBUMIN 3.7   No results for input(s): "LIPASE", "AMYLASE" in the last 168 hours. No results for input(s): "AMMONIA" in the last 168 hours. CBC: Recent Labs  Lab 03/13/23 1116 03/14/23 0039 03/15/23 0430  WBC 8.4 6.2 8.7  NEUTROABS 7.5  --  7.2  HGB 13.1 11.4* 11.7*  HCT 42.4 36.0* 36.3*  MCV 87.6 86.3 83.6  PLT 151 135* 149*   Cardiac Enzymes: No results for input(s): "CKTOTAL", "CKMB", "CKMBINDEX", "TROPONINI" in the last 168 hours. BNP: Invalid input(s): "POCBNP" CBG: Recent Labs  Lab 03/13/23 1701 03/13/23 2137 03/14/23 0736  GLUCAP 94  99 105*   D-Dimer No results for input(s): "DDIMER" in the last 72  hours. Hgb A1c Recent Labs    03/13/23 1640  HGBA1C 5.7*   Lipid Profile No results for input(s): "CHOL", "HDL", "LDLCALC", "TRIG", "CHOLHDL", "LDLDIRECT" in the last 72 hours. Thyroid function studies No results for input(s): "TSH", "T4TOTAL", "T3FREE", "THYROIDAB" in the last 72 hours.  Invalid input(s): "FREET3" Anemia work up No results for input(s): "VITAMINB12", "FOLATE", "FERRITIN", "TIBC", "IRON", "RETICCTPCT" in the last 72 hours. Urinalysis    Component Value Date/Time   COLORURINE YELLOW 03/13/2023 1552   APPEARANCEUR CLEAR 03/13/2023 1552   LABSPEC 1.021 03/13/2023 1552   PHURINE 8.0 03/13/2023 1552   GLUCOSEU >=500 (A) 03/13/2023 1552   HGBUR NEGATIVE 03/13/2023 1552   BILIRUBINUR NEGATIVE 03/13/2023 1552   KETONESUR NEGATIVE 03/13/2023 1552   PROTEINUR NEGATIVE 03/13/2023 1552   UROBILINOGEN 0.2 09/08/2014 0927   NITRITE NEGATIVE 03/13/2023 1552   LEUKOCYTESUR TRACE (A) 03/13/2023 1552   Sepsis Labs Recent Labs  Lab 03/13/23 1116 03/14/23 0039 03/15/23 0430  WBC 8.4 6.2 8.7   Microbiology Recent Results (from the past 240 hour(s))  Blood culture (routine x 2)     Status: None (Preliminary result)   Collection Time: 03/13/23 11:17 AM   Specimen: BLOOD  Result Value Ref Range Status   Specimen Description BLOOD LEFT ANTECUBITAL  Final   Special Requests   Final    BOTTLES DRAWN AEROBIC AND ANAEROBIC Blood Culture results may not be optimal due to an inadequate volume of blood received in culture bottles   Culture   Final    NO GROWTH 3 DAYS Performed at [facility], [address], [place], [place] [address]    Report Status PENDING  Incomplete  Resp panel by RT-PCR (RSV, Flu A&B, Covid)     Status: None   Collection Time: 03/13/23 11:18 AM   Specimen: Nasal Swab  Result Value Ref Range Status   SARS Coronavirus 2 by RT PCR NEGATIVE NEGATIVE Final   Influenza A by PCR NEGATIVE NEGATIVE Final   Influenza B by PCR NEGATIVE NEGATIVE Final     Comment: (NOTE) The Xpert Xpress SARS-CoV-2/FLU/RSV plus assay is intended as an aid in the diagnosis of influenza from Nasopharyngeal swab specimens and should not be used as a sole basis for treatment. Nasal washings and aspirates are unacceptable for Xpert Xpress SARS-CoV-2/FLU/RSV testing.  Fact Sheet for Patients: BloggerCourse.com  Fact Sheet for Healthcare Providers: SeriousBroker.it  This test is not yet approved or cleared by the [place] FDA and has been authorized for detection and/or diagnosis of SARS-CoV-2 by FDA under an Emergency Use Authorization (EUA). This EUA will remain in effect (meaning this test can be used) for the duration of the COVID-19 declaration under Section 564(b)(1) of the Act, 21 U.S.C. section 360bbb-3(b)(1), unless the authorization is terminated or revoked.     Resp Syncytial Virus by PCR NEGATIVE NEGATIVE Final    Comment: (NOTE) Fact Sheet for Patients: BloggerCourse.com  Fact Sheet for Healthcare Providers: SeriousBroker.it  This test is not yet approved or cleared by the [place] FDA and has been authorized for detection and/or diagnosis of SARS-CoV-2 by FDA under an Emergency Use Authorization (EUA). This EUA will remain in effect (meaning this test can be used) for the duration of the COVID-19 declaration under Section 564(b)(1) of the Act, 21 U.S.C. section 360bbb-3(b)(1), unless the authorization is terminated or revoked.  Performed at [facility], [address], [place], [place] [address]   Blood culture (routine x 2)     Status: None (Preliminary result)   Collection Time: 03/13/23 11:22 AM   Specimen: BLOOD RIGHT FOREARM  Result Value Ref Range Status   Specimen Description BLOOD RIGHT FOREARM  Final   Special Requests   Final    BOTTLES DRAWN AEROBIC AND ANAEROBIC Blood Culture results may not be optimal due  to an inadequate volume of blood received in culture bottles   Culture   Final    NO GROWTH 3 DAYS Performed at [facility], [address] [place], [place] [address]    Report Status PENDING  Incomplete  Expectorated Sputum Assessment w Gram Stain, Rflx to Resp Cult     Status: None   Collection Time: 03/13/23  4:24 PM   Specimen: Sputum  Result Value Ref Range Status   Specimen Description SPUTUM  Final   Special Requests NONE  Final   Sputum evaluation   Final    Sputum specimen not acceptable for testing.  Please recollect.   RESULT CALLED TO, READ BACK BY AND VERIFIED WITH: RN TERESA Performed at [facility], [address] [place], [place] [address]    Report Status 03/14/2023 FINAL  Final     Time coordinating discharge: Over 30 minutes  SIGNED:   Huey Bienenstock, MD  [facility] 03/16/2023, 11:37 AM Pager   If 7PM-7AM, please contact night-coverage [email or web address] Password [email or web address]

## 2023-03-16 NOTE — Discharge Instructions (Signed)
Follow with Primary MD [name], DO in 7 days   Get CBC, CMP, 2 view Chest X ray checked  by Primary MD next visit.    Activity: As tolerated with Full fall precautions use walker/cane & assistance as needed   Disposition Home    Diet: Heart Healthy  On your next visit with your primary care physician please Get Medicines reviewed and adjusted.   Please request your Prim.MD to go over all Hospital Tests and Procedure/Radiological results at the follow up, please get all Hospital records sent to your Prim MD by signing hospital release before you go home.   If you experience worsening of your admission symptoms, develop shortness of breath, life threatening emergency, suicidal or homicidal thoughts you must seek medical attention immediately by calling 911 or calling your MD immediately  if symptoms less severe.  You Must read complete instructions/literature along with all the possible adverse reactions/side effects for all the Medicines you take and that have been prescribed to you. Take any new Medicines after you have completely understood and accpet all the possible adverse reactions/side effects.   Do not drive, operating heavy machinery, perform activities at heights, swimming or participation in water activities or provide baby sitting services if your were admitted for syncope or siezures until you have seen by Primary MD or a Neurologist and advised to do so again.  Do not drive when taking Pain medications.    Do not take more than prescribed Pain, Sleep and Anxiety Medications  Special Instructions: If you have smoked or chewed Tobacco  in the last 2 yrs please stop smoking, stop any regular Alcohol  and or any Recreational drug use.  Wear Seat belts while driving.   Please note  You were cared for by a hospitalist during your hospital stay. If you have any questions about your discharge medications or the care you received while you were in the hospital after you  are discharged, you can call the unit and asked to speak with the hospitalist on call if the hospitalist that took care of you is not available. Once you are discharged, your primary care physician will handle any further medical issues. Please note that NO REFILLS for any discharge medications will be authorized once you are discharged, as it is imperative that you return to your primary care physician (or establish a relationship with a primary care physician if you do not have one) for your aftercare needs so that they can reassess your need for medications and monitor your lab values.

## 2023-03-20 DIAGNOSIS — Z9981 Dependence on supplemental oxygen: Secondary | ICD-10-CM | POA: Diagnosis not present

## 2023-03-20 DIAGNOSIS — K219 Gastro-esophageal reflux disease without esophagitis: Secondary | ICD-10-CM | POA: Diagnosis not present

## 2023-03-20 DIAGNOSIS — I5032 Chronic diastolic (congestive) heart failure: Secondary | ICD-10-CM | POA: Diagnosis not present

## 2023-03-20 DIAGNOSIS — Z09 Encounter for follow-up examination after completed treatment for conditions other than malignant neoplasm: Secondary | ICD-10-CM | POA: Diagnosis not present

## 2023-03-20 DIAGNOSIS — J690 Pneumonitis due to inhalation of food and vomit: Secondary | ICD-10-CM | POA: Diagnosis not present

## 2023-03-20 DIAGNOSIS — J440 Chronic obstructive pulmonary disease with acute lower respiratory infection: Secondary | ICD-10-CM | POA: Diagnosis not present

## 2023-03-20 DIAGNOSIS — I272 Pulmonary hypertension, unspecified: Secondary | ICD-10-CM | POA: Diagnosis not present

## 2023-03-23 ENCOUNTER — Other Ambulatory Visit: Payer: Self-pay | Admitting: Cardiology

## 2023-03-24 ENCOUNTER — Other Ambulatory Visit (HOSPITAL_COMMUNITY): Payer: Self-pay

## 2023-03-26 ENCOUNTER — Ambulatory Visit (INDEPENDENT_AMBULATORY_CARE_PROVIDER_SITE_OTHER): Payer: Medicare Other | Admitting: Podiatry

## 2023-03-26 ENCOUNTER — Encounter: Payer: Self-pay | Admitting: Podiatry

## 2023-03-26 DIAGNOSIS — B351 Tinea unguium: Secondary | ICD-10-CM | POA: Diagnosis not present

## 2023-03-26 DIAGNOSIS — L84 Corns and callosities: Secondary | ICD-10-CM

## 2023-03-26 DIAGNOSIS — M79609 Pain in unspecified limb: Secondary | ICD-10-CM | POA: Diagnosis not present

## 2023-03-26 DIAGNOSIS — D689 Coagulation defect, unspecified: Secondary | ICD-10-CM | POA: Diagnosis not present

## 2023-04-04 NOTE — Progress Notes (Signed)
  Subjective:  Patient ID: Joshua [name], male    DOB: 01-10-1936,  MRN: [id number]  Joshua [name] presents to clinic today for: at risk foot care with h/o clotting disorder and callus(es) of both feet and painful thick toenails that are difficult to trim. Painful toenails interfere with ambulation. Aggravating factors include wearing enclosed shoe gear. Pain is relieved with periodic professional debridement. Painful calluses are aggravated when weightbearing with and without shoegear. Pain is relieved with periodic professional debridement. His wife is present during today's visit. Chief Complaint  Patient presents with   RFC    RFC   PCP is [name], DO. LOV 03/20/2023.  Allergies  Allergen Reactions   Other Swelling    Beer- Swelling    Sunflower Oil Swelling   Sulfa Antibiotics Rash    Review of Systems: Negative except as noted in the HPI.  Objective: No changes noted in today's physical examination. There were no vitals filed for this visit.  Joshua [name] is a pleasant 87 y.o. male in NAD. AAO x 3.  Vascular Examination: Capillary refill time <3 seconds b/l LE. Palpable pedal pulses b/l LE. Digital hair present b/l. No pedal edema b/l. Skin temperature gradient WNL b/l. No varicosities b/l. No ischemia or gangrene noted b/l LE. No cyanosis or clubbing noted b/l LE.Marland Kitchen  Dermatological Examination: Pedal skin with normal turgor, texture and tone b/l. No open wounds. No interdigital macerations b/l. Toenails 1-5 b/l thickened, discolored, dystrophic with subungual debris. There is pain on palpation to dorsal aspect of nailplates. Hyperkeratotic lesion(s) submet head 5 b/l.  No erythema, no edema, no drainage, no fluctuance..  Neurological Examination: Protective sensation intact with 10 gram monofilament b/l LE. Vibratory sensation intact b/l LE.   Musculoskeletal Examination: Muscle strength 5/5 to all lower extremity muscle groups bilaterally. Hammertoe(s) noted  to the bilateral 5th toes. Patient ambulates independent of any assistive aids.     Latest Ref Rng & Units 03/13/2023    4:40 PM  Hemoglobin A1C  Hemoglobin-A1c 4.8 - 5.6 % 5.7    Assessment/Plan: 1. Pain due to onychomycosis of nail   2. Callus   3. Coagulation disorder (HCC)     -Patient was evaluated and treated. All patient's and/or POA's questions/concerns answered on today's visit. -Continue supportive shoe gear daily. -Mycotic toenails 1-5 bilaterally were debrided in length and girth with sterile nail nippers and dremel without incident. -Callus(es) submet head 5 b/l pared utilizing sterile scalpel blade without complication or incident. Total number debrided =2. -Patient/POA to call should there be question/concern in the interim.   Return in about 3 months (around 06/26/2023).  Freddie Breech, DPM

## 2023-04-25 ENCOUNTER — Ambulatory Visit (HOSPITAL_COMMUNITY)
Admission: RE | Admit: 2023-04-25 | Discharge: 2023-04-25 | Disposition: A | Discharge status: Home / Self Care | Payer: Medicare Other | Source: Ambulatory Visit | Attending: Nurse Practitioner | Admitting: Nurse Practitioner

## 2023-04-25 DIAGNOSIS — J189 Pneumonia, unspecified organism: Secondary | ICD-10-CM | POA: Insufficient documentation

## 2023-04-25 DIAGNOSIS — J432 Centrilobular emphysema: Secondary | ICD-10-CM | POA: Diagnosis not present

## 2023-04-25 DIAGNOSIS — R918 Other nonspecific abnormal finding of lung field: Secondary | ICD-10-CM | POA: Diagnosis not present

## 2023-05-05 ENCOUNTER — Encounter (HOSPITAL_BASED_OUTPATIENT_CLINIC_OR_DEPARTMENT_OTHER): Payer: Self-pay | Admitting: Pulmonary Disease

## 2023-05-05 ENCOUNTER — Ambulatory Visit (HOSPITAL_BASED_OUTPATIENT_CLINIC_OR_DEPARTMENT_OTHER): Payer: Medicare Other | Admitting: Pulmonary Disease

## 2023-05-05 VITALS — BP 108/68 | HR 82 | Ht 75.0 in | Wt 189.4 lb

## 2023-05-05 DIAGNOSIS — J432 Centrilobular emphysema: Secondary | ICD-10-CM | POA: Diagnosis not present

## 2023-05-05 DIAGNOSIS — J9611 Chronic respiratory failure with hypoxia: Secondary | ICD-10-CM

## 2023-05-05 DIAGNOSIS — J189 Pneumonia, unspecified organism: Secondary | ICD-10-CM

## 2023-05-05 DIAGNOSIS — Z23 Encounter for immunization: Secondary | ICD-10-CM

## 2023-05-05 NOTE — Assessment & Plan Note (Signed)
Due to aspiration from hiatal hernia. Unfortunately he is not a candidate for surgical correction Would have to do with conservative measures -on Protonix and discussed nonpharmacological  measures to prevent aspiration and cough during  sleep including head of bed elevation, small portions for dinner, at least 2 hours between last meal and bedtime

## 2023-05-05 NOTE — Progress Notes (Signed)
Subjective:    Patient ID: Joshua [name], male    DOB: Jan 04, 1936, 87 y.o.   MRN: [id number]  HPI 87 yo former smoker for FU of COPD, chronic PE & pulmonary hypertension ,being treated as CTEPH -Recurrent aspiration pneumonia   PMH : PE postop after a knee replacement in 2016. -He lost more than 50 pounds,seen by GI, duodenal narrowing without obvious stricture -Hemoptysis 09/2022 with RLL Pneumonia   - hospitalized 03/2021 for CHF exacerbation>> started on Farxiga and midodrine He was again hospitalized 04/2021 with persistent nausea and vomiting and hypotension, requiring pressors.  CT abdomen/pelvis showed narrowing of duodenum but upper GI series did not show any stricture, there was an abdominal hernia with nonobstructed bowel -EGD 06/2021 showed Candida esophagitis, hiatal hernia  - "Nutcracker phenomenon:" abrupt narrowing third portion of duodenum as it passes between abdominal aorta and SMV.   Meds-  tried on Breo, anoro & trelegy  none of these gave any symptomatic relief -prefers Spiriva, stiolto Breztri no better than stiolto Started on rociguat 01/2021  admitted in [place] on July 4th for sepsis secondary to pneumonia CTA chest was negative for PE, positive for dense consolidation with air bronchograms in LLL, atelectasis vs infiltrate in RLL and minimal atelectasis in RML and lingula   He was readmitted 7/25 for 3 days for right lower lobe pneumonia. He has seen Dr. Audley Hose from GI.  He arrives with oxygen saturation of 79% on 2 L POC, increased to 93% on 3 L POC. He reports nocturnal cough. On Gabapentin for unclear reason. Compliant with torsemide. Weight is unchanged  CT chest 9/6 shows clearing of bibasilar infiltrates, await official read  Significant tests/ events reviewed   6 minute walk (10/22): 274 m (oxygen saturation dropped to 70s on 2L [place])   02/2021 Ambulatory saturation was 93% at rest and desaturated to 87% on walking, recovered with 3 L of oxygen and maintain  while ambulating   RHC (4/22) with mean RA 5, PA 65/19 mean 36, mean PCWP 5, CI 2.19, PVR 6.1 WU, PAPi 9.2.   Echo 12/2017 RVSP 39  NPSG 02/2018 >> no OSA, 45 mins desatn, TST 5.5 h   09/15/14 CT chest showed a large bilateral pulmonary embolism with RV dilatation. 08/2014 Venous Doppler showed no DVT    01/29/21- FEV1 51%, no BD response, ratio 61, TLC 77%, DLCO 35%  PFTs  11/2014 - FEV1 59%, no BD response, ratio 70, TLC 68%, DLCO 35%  Review of Systems neg for any significant sore throat, dysphagia, itching, sneezing, nasal congestion or excess/ purulent secretions, fever, chills, sweats, unintended wt loss, pleuritic or exertional cp, hempoptysis, orthopnea pnd or change in chronic leg swelling. Also denies presyncope, palpitations, heartburn, abdominal pain, nausea, vomiting, diarrhea or change in bowel or urinary habits, dysuria,hematuria, rash, arthralgias, visual complaints, headache, numbness weakness or ataxia.     Objective:   Physical Exam  Gen. Pleasant, well-nourished, in no distress ENT - no thrush, no pallor/icterus,no post nasal drip Neck: No JVD, no thyromegaly, no carotid bruits Lungs: no use of accessory muscles, no dullness to percussion, clear without rales or rhonchi  Cardiovascular: Rhythm regular, heart sounds  normal, no murmurs or gallops, no peripheral edema Musculoskeletal: No deformities, no cyanosis or clubbing         Assessment & Plan:

## 2023-05-05 NOTE — Patient Instructions (Addendum)
x flu shot  Oxygen saturation was low on 2 L. x ambulatory saturation on 3 L.  We discussed measures to prevent aspiration and cough during  sleep including head of bed elevation, small portions for dinner, at least 2 hours between last meal and bedtime

## 2023-05-05 NOTE — Assessment & Plan Note (Signed)
Oxygen requirement seems to be increased to 3 L. Will check ambulatory saturation on 3liters POC and confirm that this level is okay

## 2023-05-05 NOTE — Assessment & Plan Note (Signed)
Continue Stiolto Use albuterol for rescue. Delsym as needed for cough

## 2023-05-14 ENCOUNTER — Telehealth: Payer: Self-pay

## 2023-05-14 DIAGNOSIS — N401 Enlarged prostate with lower urinary tract symptoms: Secondary | ICD-10-CM | POA: Diagnosis not present

## 2023-05-14 DIAGNOSIS — M109 Gout, unspecified: Secondary | ICD-10-CM | POA: Diagnosis not present

## 2023-05-14 DIAGNOSIS — Z86711 Personal history of pulmonary embolism: Secondary | ICD-10-CM | POA: Diagnosis not present

## 2023-05-14 DIAGNOSIS — Z23 Encounter for immunization: Secondary | ICD-10-CM | POA: Diagnosis not present

## 2023-05-14 DIAGNOSIS — Z0000 Encounter for general adult medical examination without abnormal findings: Secondary | ICD-10-CM | POA: Diagnosis not present

## 2023-05-14 DIAGNOSIS — K219 Gastro-esophageal reflux disease without esophagitis: Secondary | ICD-10-CM | POA: Diagnosis not present

## 2023-05-14 DIAGNOSIS — J690 Pneumonitis due to inhalation of food and vomit: Secondary | ICD-10-CM | POA: Diagnosis not present

## 2023-05-14 DIAGNOSIS — J440 Chronic obstructive pulmonary disease with acute lower respiratory infection: Secondary | ICD-10-CM | POA: Diagnosis not present

## 2023-05-14 DIAGNOSIS — I5032 Chronic diastolic (congestive) heart failure: Secondary | ICD-10-CM | POA: Diagnosis not present

## 2023-05-14 DIAGNOSIS — I272 Pulmonary hypertension, unspecified: Secondary | ICD-10-CM | POA: Diagnosis not present

## 2023-05-14 DIAGNOSIS — Z9981 Dependence on supplemental oxygen: Secondary | ICD-10-CM | POA: Diagnosis not present

## 2023-05-14 DIAGNOSIS — E118 Type 2 diabetes mellitus with unspecified complications: Secondary | ICD-10-CM | POA: Diagnosis not present

## 2023-05-14 NOTE — Patient Instructions (Signed)
Visit Information  Thank you for taking time to visit with me today. Please don't hesitate to contact me if I can be of assistance to you.   Following are the goals we discussed today:   Goals Addressed             This Visit's Progress    COMPLETED: Care Coordination Activities-No follow up required       Care Coordination Interventions: Discussed [facility] services and support. Assessed SDOH. Advised to discuss with primary care physician if services needed in the future.        If you are experiencing a Mental Health or Behavioral Health Crisis or need someone to talk to, please call the Suicide and Crisis Lifeline: 988   Patient verbalizes understanding of instructions and care plan provided today and agrees to view in MyChart. Active MyChart status and patient understanding of how to access instructions and care plan via MyChart confirmed with patient.     The patient has been provided with contact information for the care management team and has been advised to call with any health related questions or concerns.   Bary Leriche, RN, MSN [facility], [facility] Management Community Coordinator Direct Dial: [phone]  Fax: [phone] Website: [email or web address]

## 2023-05-14 NOTE — Patient Outreach (Signed)
Care Coordination   In Person Provider Office Visit Note   05/14/2023 Name: Joshua [name] MRN: [id number] DOB: May 02, 1936  Joshua [name] is a 87 y.o. year old male who sees [name], [place] for primary care. I engaged with Cory Munch in the providers office today.  What matters to the patients health and wellness today?  none    Goals Addressed             This Visit's Progress    COMPLETED: Care Coordination Activities-No follow up required       Care Coordination Interventions: Discussed [facility] services and support. Assessed SDOH. Advised to discuss with primary care physician if services needed in the future.        SDOH assessments and interventions completed:  Yes  SDOH Interventions Today    Flowsheet Row Most Recent Value  SDOH Interventions   Food Insecurity Interventions Intervention Not Indicated  Transportation Interventions Intervention Not Indicated        Care Coordination Interventions:  Yes, provided   Follow up plan: No further intervention required.   Encounter Outcome:  Patient Visit Completed   Bary Leriche, RN, MSN [facility]  [facility], [facility] Management Community Coordinator Direct Dial: [phone]  Fax: [phone] Website: [email or web address]

## 2023-06-24 ENCOUNTER — Other Ambulatory Visit (HOSPITAL_COMMUNITY): Payer: Self-pay

## 2023-06-28 ENCOUNTER — Other Ambulatory Visit (HOSPITAL_COMMUNITY): Payer: Self-pay | Admitting: Cardiology

## 2023-06-28 ENCOUNTER — Other Ambulatory Visit (HOSPITAL_COMMUNITY): Payer: Self-pay | Admitting: Family Medicine

## 2023-07-04 ENCOUNTER — Encounter (HOSPITAL_COMMUNITY): Payer: Self-pay | Admitting: Cardiology

## 2023-07-04 ENCOUNTER — Ambulatory Visit (HOSPITAL_COMMUNITY)
Admission: RE | Admit: 2023-07-04 | Discharge: 2023-07-04 | Disposition: A | Discharge status: Home / Self Care | Payer: Medicare Other | Source: Ambulatory Visit | Attending: Cardiology | Admitting: Cardiology

## 2023-07-04 VITALS — BP 102/60 | HR 82 | Wt 185.0 lb

## 2023-07-04 DIAGNOSIS — J9610 Chronic respiratory failure, unspecified whether with hypoxia or hypercapnia: Secondary | ICD-10-CM | POA: Insufficient documentation

## 2023-07-04 DIAGNOSIS — J9611 Chronic respiratory failure with hypoxia: Secondary | ICD-10-CM | POA: Diagnosis not present

## 2023-07-04 DIAGNOSIS — Z9981 Dependence on supplemental oxygen: Secondary | ICD-10-CM | POA: Insufficient documentation

## 2023-07-04 DIAGNOSIS — I2782 Chronic pulmonary embolism: Secondary | ICD-10-CM | POA: Diagnosis not present

## 2023-07-04 DIAGNOSIS — K2100 Gastro-esophageal reflux disease with esophagitis, without bleeding: Secondary | ICD-10-CM | POA: Diagnosis not present

## 2023-07-04 DIAGNOSIS — I272 Pulmonary hypertension, unspecified: Secondary | ICD-10-CM | POA: Diagnosis not present

## 2023-07-04 DIAGNOSIS — J439 Emphysema, unspecified: Secondary | ICD-10-CM | POA: Diagnosis not present

## 2023-07-04 DIAGNOSIS — I50810 Right heart failure, unspecified: Secondary | ICD-10-CM

## 2023-07-04 DIAGNOSIS — I5032 Chronic diastolic (congestive) heart failure: Secondary | ICD-10-CM | POA: Diagnosis not present

## 2023-07-04 DIAGNOSIS — N183 Chronic kidney disease, stage 3 unspecified: Secondary | ICD-10-CM | POA: Diagnosis not present

## 2023-07-04 DIAGNOSIS — G4733 Obstructive sleep apnea (adult) (pediatric): Secondary | ICD-10-CM | POA: Diagnosis not present

## 2023-07-04 DIAGNOSIS — I130 Hypertensive heart and chronic kidney disease with heart failure and stage 1 through stage 4 chronic kidney disease, or unspecified chronic kidney disease: Secondary | ICD-10-CM | POA: Insufficient documentation

## 2023-07-04 DIAGNOSIS — Z79899 Other long term (current) drug therapy: Secondary | ICD-10-CM | POA: Insufficient documentation

## 2023-07-04 DIAGNOSIS — Z7901 Long term (current) use of anticoagulants: Secondary | ICD-10-CM | POA: Diagnosis not present

## 2023-07-04 DIAGNOSIS — I2510 Atherosclerotic heart disease of native coronary artery without angina pectoris: Secondary | ICD-10-CM | POA: Diagnosis not present

## 2023-07-04 LAB — CBC
HCT: 41.1 % (ref 39.0–52.0)
Hemoglobin: 12.6 g/dL — ABNORMAL LOW (ref 13.0–17.0)
MCH: 26.1 pg (ref 26.0–34.0)
MCHC: 30.7 g/dL (ref 30.0–36.0)
MCV: 85.3 fL (ref 80.0–100.0)
Platelets: 211 10*3/uL (ref 150–400)
RBC: 4.82 MIL/uL (ref 4.22–5.81)
RDW: 15.9 % — ABNORMAL HIGH (ref 11.5–15.5)
WBC: 4.6 10*3/uL (ref 4.0–10.5)
nRBC: 0 % (ref 0.0–0.2)

## 2023-07-04 LAB — LIPID PANEL
Cholesterol: 62 mg/dL (ref 0–200)
HDL: 31 mg/dL — ABNORMAL LOW (ref >40)
LDL Cholesterol: 21 mg/dL (ref 0–99)
Total CHOL/HDL Ratio: 2 {ratio}
Triglycerides: 50 mg/dL (ref <150)
VLDL: 10 mg/dL (ref 0–40)

## 2023-07-04 LAB — BASIC METABOLIC PANEL
Anion gap: 9 (ref 5–15)
BUN: 30 mg/dL — ABNORMAL HIGH (ref 8–23)
CO2: 32 mmol/L (ref 22–32)
Calcium: 10.2 mg/dL (ref 8.9–10.3)
Chloride: 99 mmol/L (ref 98–111)
Creatinine, Ser: 1.58 mg/dL — ABNORMAL HIGH (ref 0.61–1.24)
GFR, Estimated: 43 mL/min — ABNORMAL LOW (ref >60)
Glucose, Bld: 125 mg/dL — ABNORMAL HIGH (ref 70–99)
Potassium: 3.7 mmol/L (ref 3.5–5.1)
Sodium: 140 mmol/L (ref 135–145)

## 2023-07-04 LAB — BRAIN NATRIURETIC PEPTIDE: B Natriuretic Peptide: 149.9 pg/mL — ABNORMAL HIGH (ref 0.0–100.0)

## 2023-07-04 NOTE — Patient Instructions (Signed)
There has been no changes to your medications.  Labs done today, your results will be available in MyChart, we will contact you for abnormal readings.  Your physician recommends that you schedule a follow-up appointment in: 4 months.  If you have any questions or concerns before your next appointment please send [place] a message through [place] or call our office at [phone].    TO LEAVE A MESSAGE FOR THE NURSE SELECT OPTION 2, PLEASE LEAVE A MESSAGE INCLUDING: YOUR NAME DATE OF BIRTH CALL BACK NUMBER REASON FOR CALL**this is important as we prioritize the call backs  YOU WILL RECEIVE A CALL BACK THE SAME DAY AS LONG AS YOU CALL BEFORE 4:00 PM  At the [facility], you and your health needs are our priority. As part of our continuing mission to provide you with exceptional heart care, we have created designated Provider Care Teams. These Care Teams include your primary Cardiologist (physician) and Advanced Practice Providers (APPs- Physician Assistants and Nurse Practitioners) who all work together to provide you with the care you need, when you need it.   You may see any of the following providers on your designated Care Team at your next follow up: Dr Arvilla Meres Dr Marca Ancona Dr. Dorthula Nettles Dr. Clearnce Hasten Amy Filbert Schilder, NP Robbie Lis, [place] [facility] [place], [place] Brynda Peon, NP [place] Lee, NP Karle Plumber, PharmD   Please be sure to bring in all your medications bottles to every appointment.    Thank you for choosing [facility]

## 2023-07-04 NOTE — Progress Notes (Signed)
PCP: [name], DO Cardiology: Dr. Excell Seltzer HF Cardiology: Dr. Shirlee Latch  87 y.o. with history of HFPEF, prior PE, and pulmonary hypertension was referred by Dr. Izora Ribas for evaluation of pulmonary hypertension.  Patient had a PE in 2016.  He is on apixaban. He has a long history of diastolic CHF.  Most recent echo showed a significant component of RV failure with EF 55-60%, IV septum flattened, severe RV enlargement, severely decreased RV function, PASP 57 mmHg.  CTA chest in 4/22 showed no acute PE and mild emphysema, but V/Q scan in 5/22 was suggestive of chronic PE in the right middle lobe.  RHC in 4/22 showed normal filling pressures with moderate PAH. He additionally has chronic hiccups followed by Dr. Elnoria Howard, now on gabapentin. PFTs in 6/22 showed mixed picture with severe obstruction, moderate restriction, and severely decreased DLCO.   Admitted 8/25-8/30/22 with A/C HF exacerbation. He was aggressively diuresed with lasix/metolazone, had unna boots, farxiga added.  Midodrine was increased for hypotension. Hospitalization c/b AKI on CKD III.  Readmitted 9/1-9/12/22 with shock (mixed septic and cardiogenic), likely 2/2 to aspiration pneumonia and A/C CHF.  Suspect possible gut translocation with ileus/partial SBO; CT abdomen with "nutcracker phenomenon".  He was started on NE + antibiotics. General surgery consulted and felt not to be a surgical candidate. Diuretics initially held due to over-diuresis and shock. Eventually able to wean pressors off, restart PO torsemide and midodrine. Hospitalization c/b transaminitis, delirium, and BPH with urinary retention, requiring foley catheter. Palliative care was consulted for GOC and patient DNR/DNI. PT/OT recommended HH. He was discharged home, weight 220 lbs.  EGD 11/22 showed esophagitis, hiatal hernia and yeast.   Zio 11/22 showed 19 second run of VT.   Admitted 2/23 with PNA and a/c CHF. Seen by palliative care and remained full  code.  Admitted 8/23 with septic shock due to aspiration PNA & possible colitis. Echo showed EF 60-65%, RV severely reduced, severely enlarged, RVSP 50, LVEF 60-65%, no RWMA, mild TR, trivial MR. AHF consulted for elevated Hs trop. He underwent R/LHC showing nonobstructive mild CAD, normal/low filling pressures and mild PAH. Troponin elevation likely due to demand ischemia. Hospitalization c/b AKI on CKD. He was restarted on Farxiga and lower dose torsemide (10 mg daily). He was discharged home, weight 175 lbs.   He was admitted 11/23 with a/c respiratory failure, recently discharged (10/23)  after COVID-19 infection. Felt to be septic 2/2 PNA vs UTI. Received IV abx. HsTrop elevated on admission but felt to be demand ischemia. Stable from a HF standpoint and continued on torsemide, midodrine and Farxiga. He was discharged home with HH, weight 188 lbs.  Echo 5/24 showed EF 60-65%, grade I DD, RV function moderately reduced and RV severely enlarged  Admitted in 7/24 with RLL PNA (aspiration likely) and COPD exacerbation.   Today he returns for HF follow up.  Weight down 3 lbs.  His wife says that he has been sleeping a lot, seems worse on gabapentin.  He is using 2L home oxygen.  No dyspnea walking around the house except he gets short of breath walking up stairs.  No orthopnea/PND.  He gets heartburn occasionally after meals but no exertional chest pain.  ECG (personally reviewed): NSR, RBBB  Labs (4/22): K 4.8, creatinine 1.67 Labs (6/22): K 3.8, creatinine 1.72, ANA negative, RF 39.6 but CCP negative, SCL-70 negative, BNP 1231, HIV negative Labs (7/22): K 3.8, creatine 1.67 Labs (8/22): K 3.6, creatinine 1.6, hgb 15.3  Labs (9/22): K 5.0,  creatinine 1.2, hgb 14.6 Labs (10/22): K 3.4, creatinine 1.37 Labs (10/22): K 3.5, creatinine 1.34 Labs (12/22): K 4.5, creatinine 1.81 Labs (2/23): K 3.8, creatinine 1.20 Labs (5/23): K 4.3, creatinine 1.5, BNP 84 Labs (8/23): K 4.5, creatinine 1.00 Labs  (10/23): K 3.9, creatinine 1.49, AST 43, ALT normal, BNP 1164 Labs (12/23): K 4.1, creatinine 1.17 Labs (2/24): K 3.6, creatinine 1.64, hgb 11.4, BNP 90 Labs (5/24): K 3.7, creatinine 1.66 Labs (7/24): K 4.2, creatinine 1.07 => 1.08  6 minute walk (7/22): 213 m 6 minute walk (10/22): 274 m (oxygen saturation dropped to 70s on 2L [place])  PMH: 1. HFpEF: With prominent RV failure.   - Echo (2/22): EF 55-60%, IV septum flattened, severe RV enlargement, severely decreased RV function, PASP 57 mmHg.  - Echo (8/23): EF 60-65%, RV severely reduced, severely enlarged, RVSP 50, LVEF 60-65%, no RWMA, mild TR, trivial MR.  - Echo (5/24): EF 60-65%, grade I DD, RV function moderately reduced and RV severely enlarged 2. Venous thromboembolic disease: PE in 2016.   - Venous dopplers (4/22): No DVT.  - CTA chest (4/22): No PE.  - V/Q scan 5/22 with perfusion defect in the RML consistent with chronic PE.  3. OSA: Did not tolerate CPAP.   4. HTN 5. COPD: Prior smoker.   - CTA chest in 4/22 showed no PE, mild emphysema.  - PFTs (6/22) with severe obstruction, moderate restriction, severely decreased DLCO 6. Pulmonary hypertension: RHC (4/22) with mean RA 5, PA 65/19 mean 36, mean PCWP 5, CI 2.19, PVR 6.1 WU, PAPi 9.2.  - RHC (8/23) with mean RA 1, PA 46/10 mean 25, mean PCWP 4, CI 2.98, PVR 3.4 WU 7. Chronic hiccups 8. BPH: Has to in and out cath at times. 9. Syncope: Zio 12/22 with 19 second VT run.  10. Chronic hiccups 11. 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 12. CAD: LHC (8/23) with nonobstructive CAD.   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 Stopped working in  2022  Tobacco  Use   Smoking status: Former    Current packs/day: 0.00    Average packs/day: 2.0 packs/day for 52.0 years (104.0 ttl pk-yrs)    Types: Cigarettes    Start date: 65    Quit date: 08/19/2006    Years since quitting: 16.8   Smokeless tobacco: Never   Tobacco comments:    Former smoke 03/15/22  Vaping Use   Vaping status: Never Used  Substance and Sexual Activity   Alcohol use: Not Currently    Alcohol/week: 6.0 standard drinks of alcohol    Types: 6 Shots of liquor per week    Comment: stopped in 2018   Drug use: No   Sexual activity: Not Currently  Other Topics Concern   Not on file  Social History Narrative   Not on file   Social Determinants of Health   Financial Resource Strain: Not on file  Food Insecurity: No Food Insecurity (05/14/2023)   Hunger Vital Sign    Worried About Running Out of Food in the Last Year: Never true    Ran Out of Food in the Last Year: Never true  Transportation Needs:  No Transportation Needs (05/14/2023)   PRAPARE - Administrator, Civil Service (Medical): No    Lack of Transportation (Non-Medical): No  Physical Activity: Not on file  Stress: No Stress Concern Present (05/09/2021)   [employer] of Occupational Health - Occupational Stress Questionnaire    Feeling of Stress : Only a little  Social Connections: Not on file  Intimate Partner Violence: Not At Risk (07/18/2022)   Humiliation, Afraid, Rape, and Kick questionnaire    Fear of Current or Ex-Partner: No    Emotionally Abused: No    Physically Abused: No    Sexually Abused: No   Family History  Problem Relation Age of Onset   Heart attack Brother 57   ROS: All systems reviewed and negative except as per HPI.   Current Outpatient Medications  Medication Sig Dispense Refill   albuterol (VENTOLIN HFA) 108 (90 Base) MCG/ACT inhaler Inhale 1 puff into the lungs every 4 (four) hours as needed for shortness of breath.     allopurinol (ZYLOPRIM) 100 MG tablet Take 100 mg  by mouth every morning.     apixaban (ELIQUIS) 5 MG TABS tablet TAKE 1 TABLET BY MOUTH TWICE A DAY 60 tablet 3   atorvastatin (LIPITOR) 40 MG tablet TAKE 1 TABLET BY MOUTH EVERY DAY 90 tablet 2   Cinnamon 500 MG capsule Take 500 mg by mouth every morning.     dapagliflozin propanediol (FARXIGA) 10 MG TABS tablet Take 1 tablet (10 mg total) by mouth daily before breakfast. 90 tablet 3   finasteride (PROSCAR) 5 MG tablet Take 5 mg by mouth at bedtime.     gabapentin (NEURONTIN) 300 MG capsule Take 300 mg by mouth 3 (three) times daily.     magnesium oxide (MAG-OX) 400 (240 Mg) MG tablet TAKE 1 TABLET BY MOUTH EVERY DAY 90 tablet 1   metoCLOPramide (REGLAN) 5 MG tablet Take 5 mg by mouth 3 (three) times daily before meals.     midodrine (PROAMATINE) 5 MG tablet Take 3 tablets (15 mg total) by mouth 3 (three) times daily. 810 tablet 3   Multiple Vitamin (MULTIVITAMIN WITH MINERALS) TABS tablet Take 1 tablet by mouth daily. 30 tablet 0   OXYGEN Inhale 2 L into the lungs continuous.     pantoprazole (PROTONIX) 40 MG tablet Take 1 tablet (40 mg total) by mouth every morning. 30 tablet 5   Riociguat 2 MG TABS Take 2 mg by mouth in the morning, at noon, and at bedtime. 90 tablet 11   Tiotropium Bromide-Olodaterol (STIOLTO RESPIMAT) 2.5-2.5 MCG/ACT AERS Inhale 2 puffs into the lungs daily. 4 g 0   torsemide (DEMADEX) 20 MG tablet Take 20 mg by mouth daily.     No current facility-administered medications for this encounter.   Wt Readings from Last 3 Encounters:  07/04/23 83.9 kg (185 lb)  05/05/23 85.9 kg (189 lb 6.4 oz)  03/07/23 80.3 kg (177 lb)   BP 102/60   Pulse 82   Wt 83.9 kg (185 lb)   SpO2 93% Comment: 2l n/c  BMI 23.12 kg/m  General: NAD Neck: No JVD, no thyromegaly or thyroid nodule.  Lungs: Distant BS CV: Nondisplaced PMI.  Heart regular S1/S2, no S3/S4, no murmur.  1+ ankle edema.  No carotid bruit.  Normal pedal pulses.  Abdomen: Soft, nontender, no hepatosplenomegaly, no  distention.  Skin: Intact without lesions or rashes.  Neurologic: Alert and oriented x 3.  Psych: Normal affect. Extremities: No clubbing or cyanosis.  HEENT: Normal.   Assessment/Plan: 1. Chronic HFpEF/RV failure: Echo (2/22) with EF 55-60%, IV septum flattened, severe RV enlargement, severely decreased RV function, PASP 57 mmHg.  He has severe RV failure at baseline.  Echo this admit 8/23 >>RV severely reduced, severely enlarged, RVSP 50, LVEF 60-65%, no RWMA, mild TR, trivial MR.  RHC 8/23 showed normal/low filling pressures and mild PAH.  Echo 5/24 showed EF 60-65%, RV moderately reduced. NYHA class II as long as he is wearing his oxygen. HHe is not volume overloaded on exam. Midodrine seems to be controlling his orthostasis.  - Continue Farxiga 10 mg daily. BMET and BNP today. - Continue torsemide 20 mg daily.  - Continue midodrine 15 mg tid. Would avoid fludrocortisone. - Recommend compression hose. 2. Pulmonary HTN: PAH noted on 4/22 RHC with PVR 6.1 WU.  This appears to be multifactorial with OSA, severe emphysema, and a suspected chronic PE involving the right middle lobe (group 3 and group 4 PH). Given the suspected mixed etiology with only 1 area of chronic thromboembolism (right middle lobe) as well as age, do not think that pulmonary thromboendarterectomy would be indicated.  Rheumatologic serologic workup was negative.  PFTs showed severe obstruction and moderate restriction, suggesting significant COPD. RHC 8/23 showed mild pulmonary hypertension with PVR 3.4 WU.  - Continue riociguat.  - Holding off Tyvaso for now, suspect large group 3 PH component and only mildly elevated PA pressure on 8/23 RHC.  3. Chronic respiratory failure: Admission 10/23 for COVID. 7/24 admission for PNA.  - Stable on 2L  4. CKD III: BMET today. 5. CAD: nonobstructive on [facility] 8/23. No exertional chest pain.  - No ASA with need for apixaban.  - Continue statin. Check lipids today.  6. OSA: Moderate OSA  on sleep study. He did not tolerate CPAP.  7. Emphysema: Prior smoker.  Emphysema on CT and severe obstruction on PFTs. On 2L home oxygen. 8. Chronic PE: Diagnosed by V/Q scan.  No abnormal bleeding. He has a probable chronic RLE DVT.   - Continue Eliquis. CBC today.  9. Hiatial hernia w/ esophagitis: Has significant GERD. He follows with Dr. Elnoria Howard. - Continue PPI.  10. Nutcracker SMA: Compresses duodenum  Follow up in 4 months with APP.    Marca Ancona  07/04/23

## 2023-07-21 ENCOUNTER — Other Ambulatory Visit: Payer: Self-pay | Admitting: Cardiology

## 2023-08-02 ENCOUNTER — Other Ambulatory Visit (HOSPITAL_COMMUNITY): Payer: Self-pay | Admitting: Cardiology

## 2023-08-04 ENCOUNTER — Encounter (HOSPITAL_BASED_OUTPATIENT_CLINIC_OR_DEPARTMENT_OTHER): Payer: Self-pay | Admitting: Pulmonary Disease

## 2023-08-04 ENCOUNTER — Ambulatory Visit (HOSPITAL_BASED_OUTPATIENT_CLINIC_OR_DEPARTMENT_OTHER): Payer: Medicare Other | Admitting: Pulmonary Disease

## 2023-08-04 VITALS — BP 118/62 | HR 81 | Ht 75.0 in | Wt 186.0 lb

## 2023-08-04 DIAGNOSIS — E1165 Type 2 diabetes mellitus with hyperglycemia: Secondary | ICD-10-CM | POA: Insufficient documentation

## 2023-08-04 DIAGNOSIS — I272 Pulmonary hypertension, unspecified: Secondary | ICD-10-CM

## 2023-08-04 DIAGNOSIS — J440 Chronic obstructive pulmonary disease with acute lower respiratory infection: Secondary | ICD-10-CM | POA: Insufficient documentation

## 2023-08-04 DIAGNOSIS — J9611 Chronic respiratory failure with hypoxia: Secondary | ICD-10-CM | POA: Diagnosis not present

## 2023-08-04 DIAGNOSIS — I2724 Chronic thromboembolic pulmonary hypertension: Secondary | ICD-10-CM | POA: Insufficient documentation

## 2023-08-04 DIAGNOSIS — I2781 Cor pulmonale (chronic): Secondary | ICD-10-CM | POA: Insufficient documentation

## 2023-08-04 DIAGNOSIS — J432 Centrilobular emphysema: Secondary | ICD-10-CM

## 2023-08-04 MED ORDER — STIOLTO RESPIMAT 2.5-2.5 MCG/ACT IN AERS: 2.0000 | INHALATION_SPRAY | Freq: Every day | RESPIRATORY_TRACT | 1 refills | Status: AC

## 2023-08-04 NOTE — Progress Notes (Signed)
Subjective:    Patient ID: Joshua [name], male    DOB: 1936-02-18, 87 y.o.   MRN: [id number]  HPI  87 yo former smoker for FU of COPD, chronic PE & pulmonary hypertension ,being treated as CTEPH -Recurrent aspiration pneumonia   PMH : PE postop after a knee replacement in 2016. -He lost more than 50 pounds,seen by GI, duodenal narrowing without obvious stricture -Hemoptysis 09/2022 with RLL Pneumonia   - hospitalized 03/2021 for CHF exacerbation>> started on Farxiga and midodrine He was again hospitalized 04/2021 with persistent nausea and vomiting and hypotension, requiring pressors.  CT abdomen/pelvis showed narrowing of duodenum but upper GI series did not show any stricture, there was an abdominal hernia with nonobstructed bowel -EGD 06/2021 showed Candida esophagitis, hiatal hernia  - "Nutcracker phenomenon:" abrupt narrowing third portion of duodenum as it passes between abdominal aorta and SMV.   Meds-  tried on Breo, anoro & trelegy  none of these gave any symptomatic relief -prefers Spiriva, stiolto Breztri no better than stiolto Started on rociguat 01/2021   admitted in [place] on July 4th for sepsis secondary to pneumonia CTA chest was negative for PE, positive for dense consolidation with air bronchograms in LLL, atelectasis vs infiltrate in RLL and minimal atelectasis in RML and lingula    He was readmitted 7/25 for 3 days for right lower lobe pneumonia, resolved on CT chest 04/2023   Discussed the use of AI scribe software for clinical note transcription with the patient, who gave verbal consent to proceed.  History of Present Illness   Joshua [name], a patient with a history of hiatal hernia, duodenal obstruction, pulmonary hypertension, and a recent bout of pneumonia, presents with intermittent chest pain. He describes the pain as sharp and located in his chest. He denies any exacerbation of his previously diagnosed gastrointestinal narrowing and reports that his symptoms have  been well-managed. He also reports persistent congestion, particularly noticeable at night, which he has been unable to clear effectively. He has previously tried Mucinex for this issue. He is currently on Spiriva for his pulmonary condition but admits to occasionally forgetting his morning dose. He also experiences occasional heartburn, which is not constant and is managed with daily medication. He is also on Reglan, a blood thinner, a fluid pill, and Riociguat for his pulmonary hypertension. His weight has remained stable, and his oxygen levels are satisfactory. He sleeps on a wedge pillow to help manage reflux.       Significant tests/ events reviewed   6 minute walk (10/22): 274 m (oxygen saturation dropped to 70s on 2L [place])   02/2021 Ambulatory saturation was 93% at rest and desaturated to 87% on walking, recovered with 3 L of oxygen and maintain while ambulating   RHC (4/22) with mean RA 5, PA 65/19 mean 36, mean PCWP 5, CI 2.19, PVR 6.1 WU, PAPi 9.2.   Echo 12/2017 RVSP 39  NPSG 02/2018 >> no OSA, 45 mins desatn, TST 5.5 h   09/15/14 CT chest showed a large bilateral pulmonary embolism with RV dilatation. 08/2014 Venous Doppler showed no DVT    01/29/21- FEV1 51%, no BD response, ratio 61, TLC 77%, DLCO 35%  PFTs  11/2014 - FEV1 59%, no BD response, ratio 70, TLC 68%, DLCO 35%  Review of Systems neg for any significant sore throat, dysphagia, itching, sneezing, nasal congestion or excess/ purulent secretions, fever, chills, sweats, unintended wt loss, pleuritic or exertional cp, hempoptysis, orthopnea pnd or change in chronic leg swelling.  Also denies presyncope, palpitations, heartburn, abdominal pain, nausea, vomiting, diarrhea or change in bowel or urinary habits, dysuria,hematuria, rash, arthralgias, visual complaints, headache, numbness weakness or ataxia.     Objective:   Physical Exam  Gen. Pleasant, elderly,well-nourished, in no distress ENT - no thrush, no pallor/icterus,no post  nasal drip, on O2 Neck: No JVD, no thyromegaly, no carotid bruits Lungs: no use of accessory muscles, no dullness to percussion, clear without rales or rhonchi  Cardiovascular: Rhythm regular, heart sounds  normal, no murmurs or gallops, no peripheral edema Musculoskeletal: No deformities, no cyanosis or clubbing        Assessment & Plan:   Assessment and Plan    Pulmonary Hypertension Chronic condition managed with Riociguat. Reports stable weight and no significant changes in symptoms. Continues follow-up with Dr. Shirlee Latch. Discussed the importance of monitoring oxygen levels during activity and adjusting oxygen flow as needed based on symptoms. - Continue Riociguat - Continue follow-up with Dr. Shirlee Latch - Monitor oxygen levels during activity - Adjust oxygen flow as needed based on symptoms     Chronic resp failure with hypoxia Chronic Obstructive Pulmonary Disease (COPD) Reports occasional congestion, more pronounced at night. Currently not using Mucinex but has used it in the past. Uses Spiriva but often forgets morning doses. Discussed the potential benefit of Spiriva in reducing congestion and the importance of consistent use. Recommended keeping Spiriva next to the toothbrush to aid in remembering morning doses. - Continue Spiriva - Use Mucinex as needed for congestion - Check oxygen levels during ambulation - Keep Spiriva next to toothbrush to aid in remembering morning doses  Gastroesophageal Reflux Disease (GERD) Intermittent heartburn, managed with daily heartburn medication and Reglan three times a day. No significant reflux symptoms at night. Uses a wedge pillow to sleep. Discussed the importance of maintaining at least two to three hours between meals and bedtime to reduce reflux symptoms. - Continue current heartburn medication - Continue Reglan three times a day - Maintain at least two to three hours between meals and bedtime - Continue using wedge pillow for  sleeping  Resolved Pneumonia Pneumonia resolved based on a scan done in September. No new symptoms suggestive of recurrence. No additional medications needed. - No additional medications needed  General Health Maintenance Up to date with flu, RSV, and COVID vaccinations. - Ensure vaccinations remain up to date  Follow-up - Schedule follow-up appointment in six months - Contact if any new symptoms or concerns arise.      Ambulatory saturation shows that he needs 3 L of oxygen on ambulation

## 2023-08-04 NOTE — Patient Instructions (Signed)
  Continue on spiriva  Use mucinex as needed for congestion

## 2023-08-06 ENCOUNTER — Ambulatory Visit: Payer: Medicare Other | Admitting: Podiatry

## 2023-08-06 ENCOUNTER — Encounter: Payer: Self-pay | Admitting: Podiatry

## 2023-08-06 VITALS — Ht 75.0 in | Wt 186.0 lb

## 2023-08-06 DIAGNOSIS — M79609 Pain in unspecified limb: Secondary | ICD-10-CM | POA: Diagnosis not present

## 2023-08-06 DIAGNOSIS — D689 Coagulation defect, unspecified: Secondary | ICD-10-CM

## 2023-08-06 DIAGNOSIS — L84 Corns and callosities: Secondary | ICD-10-CM

## 2023-08-06 DIAGNOSIS — B351 Tinea unguium: Secondary | ICD-10-CM

## 2023-08-14 DIAGNOSIS — N401 Enlarged prostate with lower urinary tract symptoms: Secondary | ICD-10-CM | POA: Diagnosis not present

## 2023-08-14 DIAGNOSIS — R972 Elevated prostate specific antigen [PSA]: Secondary | ICD-10-CM | POA: Diagnosis not present

## 2023-08-14 DIAGNOSIS — R339 Retention of urine, unspecified: Secondary | ICD-10-CM | POA: Diagnosis not present

## 2023-08-14 NOTE — Progress Notes (Signed)
  Subjective:  Patient ID: Joshua [name], male    DOB: [birth year],  MRN: [id number]  Joshua [name] presents to clinic today for at risk foot care with h/o coagulation defect and corn(s)  right foot, porokeratotic lesion(s) right foot and painful mycotic nails. Painful toenails interfere with ambulation. Aggravating factors include wearing enclosed shoe gear. Pain is relieved with periodic professional debridement. Painful corns and porokeratotic lesion(s) aggravated when weightbearing with and without shoegear. Pain is relieved with periodic professional debridement.  Chief Complaint  Patient presents with   Nail Problem    Pt is here for RFC not a diabetic PCP is Dr Thomasena Edis and LOV ws a month ago.   New problem(s): None.   PCP is [name], DO.  Allergies  Allergen Reactions   Other Swelling    Beer- Swelling    Sunflower Oil Swelling   Sulfa Antibiotics Rash    Review of Systems: Negative except as noted in the HPI.  Objective: No changes noted in today's physical examination. There were no vitals filed for this visit. Joshua [name] is a pleasant 87 y.o. male in NAD. AAO x 3.  Assessment/Plan: 1. Pain due to onychomycosis of nail   2. Corns and callosities   3. Coagulation disorder (HCC)     -Consent given for treatment as described below: -Examined patient. -Toenails 1-5 b/l were debrided in length and girth with sterile nail nippers and dremel without iatrogenic bleeding.  -Corn(s) R 5th toe and callus(es) submet head 5 right foot were pared utilizing sterile scalpel blade without incident. Total number debrided =2. -Patient/POA to call should there be question/concern in the interim.   Return in about 3 months (around 11/04/2023).  Freddie Breech, DPM      [facility] LOCATION: [address], [place] [address]                   Office [phone]   [facility] LOCATION: [address] [place], [place] [address] Office [phone]

## 2023-08-15 ENCOUNTER — Other Ambulatory Visit (HOSPITAL_COMMUNITY): Payer: Self-pay | Admitting: Cardiology

## 2023-08-26 ENCOUNTER — Other Ambulatory Visit (HOSPITAL_COMMUNITY): Payer: Self-pay | Admitting: Cardiology

## 2023-09-05 ENCOUNTER — Other Ambulatory Visit (HOSPITAL_COMMUNITY): Payer: Self-pay | Admitting: Cardiology

## 2023-09-26 ENCOUNTER — Other Ambulatory Visit (HOSPITAL_COMMUNITY): Payer: Self-pay | Admitting: Cardiology

## 2023-10-10 ENCOUNTER — Encounter (HOSPITAL_COMMUNITY): Payer: Self-pay

## 2023-10-10 ENCOUNTER — Emergency Department (HOSPITAL_COMMUNITY): Payer: Medicare Other

## 2023-10-10 ENCOUNTER — Other Ambulatory Visit: Payer: Self-pay

## 2023-10-10 ENCOUNTER — Inpatient Hospital Stay (HOSPITAL_COMMUNITY)
Admission: EM | Admit: 2023-10-10 | Discharge: 2023-10-20 | DRG: 871 | Disposition: A | Discharge status: Home Health | Payer: Medicare Other | Attending: Internal Medicine | Admitting: Internal Medicine

## 2023-10-10 ENCOUNTER — Inpatient Hospital Stay (HOSPITAL_COMMUNITY): Payer: Medicare Other

## 2023-10-10 DIAGNOSIS — E1122 Type 2 diabetes mellitus with diabetic chronic kidney disease: Secondary | ICD-10-CM | POA: Diagnosis not present

## 2023-10-10 DIAGNOSIS — D6959 Other secondary thrombocytopenia: Secondary | ICD-10-CM | POA: Diagnosis present

## 2023-10-10 DIAGNOSIS — I2782 Chronic pulmonary embolism: Secondary | ICD-10-CM | POA: Diagnosis not present

## 2023-10-10 DIAGNOSIS — Z1152 Encounter for screening for COVID-19: Secondary | ICD-10-CM | POA: Diagnosis not present

## 2023-10-10 DIAGNOSIS — Z8672 Personal history of thrombophlebitis: Secondary | ICD-10-CM

## 2023-10-10 DIAGNOSIS — Z515 Encounter for palliative care: Secondary | ICD-10-CM | POA: Diagnosis not present

## 2023-10-10 DIAGNOSIS — M1990 Unspecified osteoarthritis, unspecified site: Secondary | ICD-10-CM | POA: Diagnosis present

## 2023-10-10 DIAGNOSIS — Z96652 Presence of left artificial knee joint: Secondary | ICD-10-CM | POA: Diagnosis present

## 2023-10-10 DIAGNOSIS — Z87891 Personal history of nicotine dependence: Secondary | ICD-10-CM | POA: Diagnosis not present

## 2023-10-10 DIAGNOSIS — J439 Emphysema, unspecified: Secondary | ICD-10-CM | POA: Diagnosis present

## 2023-10-10 DIAGNOSIS — Z7189 Other specified counseling: Secondary | ICD-10-CM | POA: Diagnosis not present

## 2023-10-10 DIAGNOSIS — A419 Sepsis, unspecified organism: Principal | ICD-10-CM | POA: Diagnosis present

## 2023-10-10 DIAGNOSIS — I5032 Chronic diastolic (congestive) heart failure: Secondary | ICD-10-CM | POA: Diagnosis not present

## 2023-10-10 DIAGNOSIS — I130 Hypertensive heart and chronic kidney disease with heart failure and stage 1 through stage 4 chronic kidney disease, or unspecified chronic kidney disease: Secondary | ICD-10-CM | POA: Diagnosis not present

## 2023-10-10 DIAGNOSIS — Z7984 Long term (current) use of oral hypoglycemic drugs: Secondary | ICD-10-CM

## 2023-10-10 DIAGNOSIS — R6521 Severe sepsis with septic shock: Secondary | ICD-10-CM | POA: Diagnosis present

## 2023-10-10 DIAGNOSIS — I10 Essential (primary) hypertension: Secondary | ICD-10-CM | POA: Diagnosis not present

## 2023-10-10 DIAGNOSIS — J189 Pneumonia, unspecified organism: Secondary | ICD-10-CM | POA: Diagnosis not present

## 2023-10-10 DIAGNOSIS — I517 Cardiomegaly: Secondary | ICD-10-CM | POA: Diagnosis not present

## 2023-10-10 DIAGNOSIS — R0602 Shortness of breath: Secondary | ICD-10-CM | POA: Diagnosis not present

## 2023-10-10 DIAGNOSIS — N401 Enlarged prostate with lower urinary tract symptoms: Secondary | ICD-10-CM | POA: Diagnosis present

## 2023-10-10 DIAGNOSIS — N400 Enlarged prostate without lower urinary tract symptoms: Secondary | ICD-10-CM | POA: Diagnosis present

## 2023-10-10 DIAGNOSIS — N170 Acute kidney failure with tubular necrosis: Secondary | ICD-10-CM | POA: Diagnosis present

## 2023-10-10 DIAGNOSIS — E872 Acidosis, unspecified: Secondary | ICD-10-CM | POA: Diagnosis present

## 2023-10-10 DIAGNOSIS — I5031 Acute diastolic (congestive) heart failure: Secondary | ICD-10-CM | POA: Diagnosis not present

## 2023-10-10 DIAGNOSIS — Z79899 Other long term (current) drug therapy: Secondary | ICD-10-CM

## 2023-10-10 DIAGNOSIS — R0902 Hypoxemia: Secondary | ICD-10-CM | POA: Diagnosis not present

## 2023-10-10 DIAGNOSIS — N1832 Chronic kidney disease, stage 3b: Secondary | ICD-10-CM | POA: Diagnosis present

## 2023-10-10 DIAGNOSIS — N1831 Chronic kidney disease, stage 3a: Secondary | ICD-10-CM | POA: Diagnosis not present

## 2023-10-10 DIAGNOSIS — R918 Other nonspecific abnormal finding of lung field: Secondary | ICD-10-CM | POA: Diagnosis not present

## 2023-10-10 DIAGNOSIS — I959 Hypotension, unspecified: Secondary | ICD-10-CM | POA: Diagnosis present

## 2023-10-10 DIAGNOSIS — K219 Gastro-esophageal reflux disease without esophagitis: Secondary | ICD-10-CM | POA: Diagnosis present

## 2023-10-10 DIAGNOSIS — Z8249 Family history of ischemic heart disease and other diseases of the circulatory system: Secondary | ICD-10-CM

## 2023-10-10 DIAGNOSIS — I9589 Other hypotension: Secondary | ICD-10-CM | POA: Diagnosis present

## 2023-10-10 DIAGNOSIS — I700 Atherosclerosis of aorta: Secondary | ICD-10-CM | POA: Diagnosis not present

## 2023-10-10 DIAGNOSIS — R6520 Severe sepsis without septic shock: Secondary | ICD-10-CM

## 2023-10-10 DIAGNOSIS — Z5982 Transportation insecurity: Secondary | ICD-10-CM

## 2023-10-10 DIAGNOSIS — Z882 Allergy status to sulfonamides status: Secondary | ICD-10-CM | POA: Diagnosis not present

## 2023-10-10 DIAGNOSIS — E119 Type 2 diabetes mellitus without complications: Secondary | ICD-10-CM

## 2023-10-10 DIAGNOSIS — R000 Tachycardia, unspecified: Secondary | ICD-10-CM | POA: Diagnosis not present

## 2023-10-10 DIAGNOSIS — Z9981 Dependence on supplemental oxygen: Secondary | ICD-10-CM | POA: Diagnosis not present

## 2023-10-10 DIAGNOSIS — I2724 Chronic thromboembolic pulmonary hypertension: Secondary | ICD-10-CM | POA: Diagnosis present

## 2023-10-10 DIAGNOSIS — Z7901 Long term (current) use of anticoagulants: Secondary | ICD-10-CM

## 2023-10-10 DIAGNOSIS — Z66 Do not resuscitate: Secondary | ICD-10-CM | POA: Diagnosis present

## 2023-10-10 DIAGNOSIS — R059 Cough, unspecified: Secondary | ICD-10-CM | POA: Diagnosis not present

## 2023-10-10 DIAGNOSIS — E869 Volume depletion, unspecified: Secondary | ICD-10-CM | POA: Diagnosis present

## 2023-10-10 DIAGNOSIS — R042 Hemoptysis: Secondary | ICD-10-CM

## 2023-10-10 DIAGNOSIS — J168 Pneumonia due to other specified infectious organisms: Secondary | ICD-10-CM | POA: Diagnosis not present

## 2023-10-10 DIAGNOSIS — G4733 Obstructive sleep apnea (adult) (pediatric): Secondary | ICD-10-CM | POA: Diagnosis present

## 2023-10-10 DIAGNOSIS — J9621 Acute and chronic respiratory failure with hypoxia: Secondary | ICD-10-CM | POA: Diagnosis present

## 2023-10-10 DIAGNOSIS — Z8701 Personal history of pneumonia (recurrent): Secondary | ICD-10-CM

## 2023-10-10 DIAGNOSIS — J690 Pneumonitis due to inhalation of food and vomit: Secondary | ICD-10-CM | POA: Diagnosis present

## 2023-10-10 DIAGNOSIS — I252 Old myocardial infarction: Secondary | ICD-10-CM

## 2023-10-10 DIAGNOSIS — J440 Chronic obstructive pulmonary disease with acute lower respiratory infection: Secondary | ICD-10-CM | POA: Diagnosis present

## 2023-10-10 DIAGNOSIS — N179 Acute kidney failure, unspecified: Secondary | ICD-10-CM | POA: Diagnosis not present

## 2023-10-10 DIAGNOSIS — R0600 Dyspnea, unspecified: Secondary | ICD-10-CM | POA: Diagnosis not present

## 2023-10-10 HISTORY — DX: Pneumonia, unspecified organism: J18.9

## 2023-10-10 LAB — COMPREHENSIVE METABOLIC PANEL
ALT: 14 U/L (ref 0–44)
AST: 37 U/L (ref 15–41)
Albumin: 3.7 g/dL (ref 3.5–5.0)
Alkaline Phosphatase: 78 U/L (ref 38–126)
Anion gap: 16 — ABNORMAL HIGH (ref 5–15)
BUN: 35 mg/dL — ABNORMAL HIGH (ref 8–23)
CO2: 29 mmol/L (ref 22–32)
Calcium: 9.9 mg/dL (ref 8.9–10.3)
Chloride: 97 mmol/L — ABNORMAL LOW (ref 98–111)
Creatinine, Ser: 1.94 mg/dL — ABNORMAL HIGH (ref 0.61–1.24)
GFR, Estimated: 33 mL/min — ABNORMAL LOW (ref >60)
Glucose, Bld: 120 mg/dL — ABNORMAL HIGH (ref 70–99)
Potassium: 4.4 mmol/L (ref 3.5–5.1)
Sodium: 142 mmol/L (ref 135–145)
Total Bilirubin: 1.4 mg/dL — ABNORMAL HIGH (ref 0.0–1.2)
Total Protein: 7.3 g/dL (ref 6.5–8.1)

## 2023-10-10 LAB — URINALYSIS, ROUTINE W REFLEX MICROSCOPIC
Bilirubin Urine: NEGATIVE
Glucose, UA: >=500 mg/dL — AB
Hgb urine dipstick: NEGATIVE
Ketones, ur: NEGATIVE mg/dL
Leukocytes,Ua: NEGATIVE
Nitrite: NEGATIVE
Protein, ur: NEGATIVE mg/dL
Specific Gravity, Urine: 1.013 (ref 1.005–1.030)
pH: 5 (ref 5.0–8.0)

## 2023-10-10 LAB — CBC WITH DIFFERENTIAL/PLATELET
Abs Immature Granulocytes: 0.07 10*3/uL (ref 0.00–0.07)
Basophils Absolute: 0 10*3/uL (ref 0.0–0.1)
Basophils Relative: 0 %
Eosinophils Absolute: 0 10*3/uL (ref 0.0–0.5)
Eosinophils Relative: 0 %
HCT: 40.7 % (ref 39.0–52.0)
Hemoglobin: 13 g/dL (ref 13.0–17.0)
Immature Granulocytes: 1 %
Lymphocytes Relative: 2 %
Lymphs Abs: 0.3 10*3/uL — ABNORMAL LOW (ref 0.7–4.0)
MCH: 27.4 pg (ref 26.0–34.0)
MCHC: 31.9 g/dL (ref 30.0–36.0)
MCV: 85.9 fL (ref 80.0–100.0)
Monocytes Absolute: 0.8 10*3/uL (ref 0.1–1.0)
Monocytes Relative: 5 %
Neutro Abs: 14.3 10*3/uL — ABNORMAL HIGH (ref 1.7–7.7)
Neutrophils Relative %: 92 %
Platelets: 137 10*3/uL — ABNORMAL LOW (ref 150–400)
RBC: 4.74 MIL/uL (ref 4.22–5.81)
RDW: 17.5 % — ABNORMAL HIGH (ref 11.5–15.5)
WBC: 15.4 10*3/uL — ABNORMAL HIGH (ref 4.0–10.5)
nRBC: 0 % (ref 0.0–0.2)

## 2023-10-10 LAB — I-STAT CG4 LACTIC ACID, ED
Lactic Acid, Venous: 2.9 mmol/L (ref 0.5–1.9)
Lactic Acid, Venous: 2.2 mmol/L (ref 0.5–1.9)

## 2023-10-10 LAB — RESP PANEL BY RT-PCR (RSV, FLU A&B, COVID)  RVPGX2
Influenza A by PCR: NEGATIVE
Influenza B by PCR: NEGATIVE
Resp Syncytial Virus by PCR: NEGATIVE
SARS Coronavirus 2 by RT PCR: NEGATIVE

## 2023-10-10 LAB — RESPIRATORY PANEL BY PCR

## 2023-10-10 LAB — BRAIN NATRIURETIC PEPTIDE: B Natriuretic Peptide: 354.9 pg/mL — ABNORMAL HIGH (ref 0.0–100.0)

## 2023-10-10 LAB — STREP PNEUMONIAE URINARY ANTIGEN: Strep Pneumo Urinary Antigen: NEGATIVE

## 2023-10-10 LAB — HEMOGLOBIN A1C
Hgb A1c MFr Bld: 5.5 % (ref 4.8–5.6)
Mean Plasma Glucose: 111.15 mg/dL

## 2023-10-10 LAB — GLUCOSE, CAPILLARY: Glucose-Capillary: 169 mg/dL — ABNORMAL HIGH (ref 70–99)

## 2023-10-10 LAB — MRSA NEXT GEN BY PCR, NASAL: MRSA by PCR Next Gen: NOT DETECTED

## 2023-10-10 MED ORDER — LACTATED RINGERS IV BOLUS
1000.0000 mL | Freq: Once | INTRAVENOUS | Status: AC
  Administered 2023-10-10: 1000 mL via INTRAVENOUS

## 2023-10-10 MED ORDER — INSULIN ASPART 100 UNIT/ML IJ SOLN: 0.0000 [IU] | Freq: Three times a day (TID) | INTRAMUSCULAR | Status: DC

## 2023-10-10 MED ORDER — SODIUM CHLORIDE 0.9 % IV BOLUS
500.0000 mL | Freq: Once | INTRAVENOUS | Status: AC
  Administered 2023-10-10: 500 mL via INTRAVENOUS

## 2023-10-10 MED ORDER — VANCOMYCIN HCL IN DEXTROSE 1-5 GM/200ML-% IV SOLN
1000.0000 mg | Freq: Once | INTRAVENOUS | Status: AC
  Administered 2023-10-10: 1000 mg via INTRAVENOUS
  Filled 2023-10-10: qty 200

## 2023-10-10 MED ORDER — DOCUSATE SODIUM 100 MG PO CAPS: 100.0000 mg | ORAL_CAPSULE | Freq: Two times a day (BID) | ORAL | Status: DC | PRN

## 2023-10-10 MED ORDER — ARFORMOTEROL TARTRATE 15 MCG/2ML IN NEBU
15.0000 ug | INHALATION_SOLUTION | Freq: Two times a day (BID) | RESPIRATORY_TRACT | Status: DC
  Administered 2023-10-11 – 2023-10-20 (×19): 15 ug via RESPIRATORY_TRACT
  Filled 2023-10-10 (×20): qty 2

## 2023-10-10 MED ORDER — SODIUM CHLORIDE 0.9 % IV SOLN
250.0000 mL | INTRAVENOUS | Status: DC
  Administered 2023-10-10: 250 mL via INTRAVENOUS

## 2023-10-10 MED ORDER — VANCOMYCIN VARIABLE DOSE PER UNSTABLE RENAL FUNCTION (PHARMACIST DOSING): Status: DC

## 2023-10-10 MED ORDER — POLYETHYLENE GLYCOL 3350 17 G PO PACK: 17.0000 g | PACK | Freq: Every day | ORAL | Status: DC | PRN

## 2023-10-10 MED ORDER — CHLORHEXIDINE GLUCONATE CLOTH 2 % EX PADS
6.0000 | MEDICATED_PAD | Freq: Every day | CUTANEOUS | Status: DC
  Administered 2023-10-11 – 2023-10-20 (×10): 6 via TOPICAL

## 2023-10-10 MED ORDER — ACETAMINOPHEN 500 MG PO TABS
500.0000 mg | ORAL_TABLET | Freq: Once | ORAL | Status: AC
  Administered 2023-10-10: 500 mg via ORAL
  Filled 2023-10-10: qty 1

## 2023-10-10 MED ORDER — SODIUM CHLORIDE 0.9 % IV SOLN
2.0000 g | Freq: Once | INTRAVENOUS | Status: AC
  Administered 2023-10-10: 2 g via INTRAVENOUS
  Filled 2023-10-10: qty 12.5

## 2023-10-10 MED ORDER — MIDODRINE HCL 5 MG PO TABS
10.0000 mg | ORAL_TABLET | Freq: Three times a day (TID) | ORAL | Status: DC
  Administered 2023-10-10 – 2023-10-14 (×12): 10 mg via ORAL
  Filled 2023-10-10 (×11): qty 2

## 2023-10-10 MED ORDER — REVEFENACIN 175 MCG/3ML IN SOLN
175.0000 ug | Freq: Every day | RESPIRATORY_TRACT | Status: DC
  Administered 2023-10-10 – 2023-10-20 (×11): 175 ug via RESPIRATORY_TRACT
  Filled 2023-10-10 (×11): qty 3

## 2023-10-10 MED ORDER — NOREPINEPHRINE 4 MG/250ML-% IV SOLN
2.0000 ug/min | INTRAVENOUS | Status: DC
  Administered 2023-10-10: 2 ug/min via INTRAVENOUS
  Filled 2023-10-10: qty 250

## 2023-10-10 MED ORDER — PANTOPRAZOLE SODIUM 40 MG PO TBEC
40.0000 mg | DELAYED_RELEASE_TABLET | Freq: Every day | ORAL | Status: DC
  Administered 2023-10-10 – 2023-10-20 (×11): 40 mg via ORAL
  Filled 2023-10-10 (×11): qty 1

## 2023-10-10 MED ORDER — ORAL CARE MOUTH RINSE
15.0000 mL | OROMUCOSAL | Status: DC | PRN
  Administered 2023-10-11: 15 mL via OROMUCOSAL

## 2023-10-10 MED ORDER — SODIUM CHLORIDE 0.9 % IV BOLUS
250.0000 mL | Freq: Once | INTRAVENOUS | Status: AC
  Administered 2023-10-10: 250 mL via INTRAVENOUS

## 2023-10-10 MED ORDER — METRONIDAZOLE 500 MG/100ML IV SOLN
500.0000 mg | Freq: Once | INTRAVENOUS | Status: AC
  Administered 2023-10-10: 500 mg via INTRAVENOUS
  Filled 2023-10-10: qty 100

## 2023-10-10 MED ORDER — SODIUM CHLORIDE 0.9 % IV SOLN
2.0000 g | Freq: Two times a day (BID) | INTRAVENOUS | Status: DC
  Administered 2023-10-10 – 2023-10-11 (×2): 2 g via INTRAVENOUS
  Filled 2023-10-10 (×2): qty 12.5

## 2023-10-10 MED ORDER — INSULIN ASPART 100 UNIT/ML IJ SOLN: 0.0000 [IU] | Freq: Every day | INTRAMUSCULAR | Status: DC

## 2023-10-10 MED ORDER — BUDESONIDE 0.5 MG/2ML IN SUSP
0.5000 mg | Freq: Two times a day (BID) | RESPIRATORY_TRACT | Status: DC
  Administered 2023-10-10 – 2023-10-20 (×20): 0.5 mg via RESPIRATORY_TRACT
  Filled 2023-10-10 (×20): qty 2

## 2023-10-10 NOTE — H&P (Signed)
 Critical care attending attestation note:  Patient seen and examined and relevant ancillary tests reviewed.  I agree with the assessment and plan of care as outlined by Tessie Fass, NP.   Synopsis of assessment and plan:  88 year old man who presented with a new onset cough and generalized malaise.  Was found to be febrile to 38 Celsius. He has a history of multifactorial pulmonary hypertension with RV failure due to advanced COPD and CTEPH.  On examination he is in no distress with only mild increased work of breathing.  Decreased air entry at the right base noted.  He is coughing up blood-tinged sputum.  Right sided gallop.  JVP is elevated to jaw.  There is no peripheral edema.  Abdomen is soft.  Chest x-ray shows right lower lobe infiltrate.  BNP is only marginally elevated.  Creatinine is up to 1.94.  Leukocytosis of 15.  Hemoglobin 13.  -Appears to have community-acquired pneumonia with mild hemoptysis related to this. -Treat for CAP. -Consider bronchoscopy if hemoptysis fails to resolve. -Titrate O2 to keep saturation greater than 92%. -Currently appears well compensated despite marginal blood pressure with good clinical perfusion at this time.  Likely still somewhat volume contracted will gently fluid resuscitate. -Continue home pulley hypertension medication. -Follow renal function.  CRITICAL CARE Performed by: Lynnell Catalan   Total critical care time: 35 minutes  Critical care time was exclusive of separately billable procedures and treating other patients.  Critical care was necessary to treat or prevent imminent or life-threatening deterioration.  Critical care was time spent personally by me on the following activities: development of treatment plan with patient and/or surrogate as well as nursing, discussions with consultants, evaluation of patient's response to treatment, examination of patient, obtaining history from patient or surrogate, ordering and performing  treatments and interventions, ordering and review of laboratory studies, ordering and review of radiographic studies, pulse oximetry, re-evaluation of patient's condition and participation in multidisciplinary rounds.  Lynnell Catalan, MD [facility] ICU Physician [facility]  Pager: [phone] Mobile: [phone] After hours: [phone].  10/10/2023, 5:02 PM

## 2023-10-10 NOTE — Progress Notes (Signed)
 Pharmacy Antibiotic Note  Joshua [name] is a 88 y.o. male for which pharmacy has been consulted for cefepime and vancomycin dosing for sepsis.  Patient with a history of COPD, chronic respiratory failure, pulmonary hypertension, CKD, PE. Patient presenting with SOB.  SCr 1.94 - above baseline WBC 15.4; LA 2.2; T 99.6; HR 90; RR 15 COVID neg / flu neg  Plan: Metronidazole per MD Cefepime 2g q12hr  Vancomycin 2000 mg once, subsequent dosing as indicated per random vancomycin level until renal function stable and/or improved, at which time scheduled dosing can be considered Monitor WBC, fever, renal function, cultures De-escalate when able  Height: 6\' 3"  (190.5 cm) Weight: 104.3 kg (230 lb) IBW/kg (Calculated) : 84.5  Temp (24hrs), Avg:100.1 F (37.8 C), Min:99.6 F (37.6 C), Max:100.5 F (38.1 C)  Recent Labs  Lab 10/10/23 0935 10/10/23 0943 10/10/23 1125 10/10/23 1234  WBC  --   --   --  15.4*  CREATININE 1.94*  --   --   --   LATICACIDVEN  --  2.9* 2.2*  --     Estimated Creatinine Clearance: 36.4 mL/min (A) (by C-G formula based on SCr of 1.94 mg/dL (H)).    Allergies  Allergen Reactions   Other Swelling    Beer- Swelling    Sunflower Oil Swelling   Sulfa Antibiotics Rash   Microbiology results: Pending  Thank you for allowing pharmacy to be a part of this patient's care.  Delmar Landau, PharmD, BCPS 10/10/2023 2:35 PM ED Clinical Pharmacist -  [phone]

## 2023-10-10 NOTE — ED Notes (Signed)
 X-ray at bedside

## 2023-10-10 NOTE — H&P (Incomplete)
History and Physical    Patient: Joshua [name]:[id number] DOB: 05-11-1936 DOA: 10/10/2023 DOS: the patient was seen and examined on 10/10/2023 PCP: [name], DO  Patient coming from: Home Chief complaint: Chief Complaint  Patient presents with   Respiratory Distress   HPI:  Joshua [name] is a 88 y.o. male with past medical history  of   COPD Gold stage III, restrictive lung disease, pulmonary hypertension, cor pulmonale, chronic hypoxic respiratory failure on 2 L continuously,, chronic HFpEF, chronic orthostatic hypotension on midodrine,heart disease/NSTEMI, pulmonary embolism, OSA, allergies to sunflower oil, beer, sulfa antibiotics, IIDM, GERD, BPH with chronic bladder outlet obstruction and self cathetering 3 times daily, on Eliquis, >> presenting today with shortness of breath along with cough that is been going on for about 2 days.  Patient was noted to be hypoxic with O2 sats of 56% and started on CPAP patient received nitroglycerin by EMS was noted to be hypotensive with systolic of 80s after that was initially 140.     >>ED Course: In emergency room  Vitals:   10/10/23 0915 10/10/23 1003 10/10/23 1004 10/10/23 1117  BP: 110/63 102/75    Pulse: (!) 121 (!) 123 (!) 121   Temp:    100 F (37.8 C)  Resp: (!) 34 (!) 24 17   Height:      Weight:      SpO2: (!) 89% 95% 100%   TempSrc:    Oral  BMI (Calculated):       ED evaluation  so far shows: ***   In the emergency room  pt has received the following treatment thus far: Medications  vancomycin (VANCOCIN) IVPB 1000 mg/200 mL premix (0 mg Intravenous Stopped 10/10/23 1105)    And  vancomycin (VANCOCIN) IVPB 1000 mg/200 mL premix (0 mg Intravenous Stopped 10/10/23 1105)  ceFEPIme (MAXIPIME) 2 g in sodium chloride 0.9 % 100 mL IVPB (0 g Intravenous Stopped 10/10/23 1058)  metroNIDAZOLE (FLAGYL) IVPB 500 mg (0 mg Intravenous Stopped 10/10/23 1106)  sodium chloride 0.9 % bolus 250 mL (0 mLs Intravenous Stopped  10/10/23 1132)     ROS Past Medical History:  Diagnosis Date   Arthritis    BPH (benign prostatic hyperplasia)    CHF (congestive heart failure) (HCC)    Chronic hiccups    COPD (chronic obstructive pulmonary disease) (HCC)    Folliculitis    posterior scalp per office visit note of Dr Selena Batten 07/20/2014     GERD (gastroesophageal reflux disease)    Gout    Hypertension    Hypotension    NSTEMI (non-ST elevated myocardial infarction) (HCC) 03/19/2022   PE (pulmonary thromboembolism) (HCC)    Phlebitis    right arm  at least 20 years ago    Pneumonia    hx of pneumonia as a child    Pulmonary embolism (HCC) 03/17/2022   Pulmonary hypertension (HCC)    Severe sepsis (HCC) 03/16/2022   Sleep apnea    Past Surgical History:  Procedure Laterality Date   BIOPSY  11/03/2020   Procedure: BIOPSY;  Surgeon: Jeani Hawking, MD;  Location: [facility];  Service: Endoscopy;;   BIOPSY  07/06/2021   Procedure: BIOPSY;  Surgeon: Jeani Hawking, MD;  Location: [facility];  Service: Endoscopy;;   bone removed from little toe right foot      CHOLECYSTECTOMY     ESOPHAGOGASTRODUODENOSCOPY (EGD) WITH PROPOFOL N/A 11/03/2020   Procedure: ESOPHAGOGASTRODUODENOSCOPY (EGD) WITH PROPOFOL;  Surgeon: Jeani Hawking, MD;  Location: [facility];  Service: Endoscopy;  Laterality: N/A;   ESOPHAGOGASTRODUODENOSCOPY (EGD) WITH PROPOFOL N/A 07/06/2021   Procedure: ESOPHAGOGASTRODUODENOSCOPY (EGD) WITH PROPOFOL;  Surgeon: Jeani Hawking, MD;  Location: [facility];  Service: Endoscopy;  Laterality: N/A;   pilonidal cyst removal      RIGHT HEART CATH N/A 12/13/2020   Procedure: RIGHT HEART CATH;  Surgeon: Laurey Morale, MD;  Location: [facility] INVASIVE CV LAB;  Service: Cardiovascular;  Laterality: N/A;   RIGHT/LEFT HEART CATH AND CORONARY ANGIOGRAPHY N/A 03/21/2022   Procedure: RIGHT/LEFT HEART CATH AND CORONARY ANGIOGRAPHY;  Surgeon: Laurey Morale, MD;  Location: [facility] INVASIVE CV LAB;  Service: Cardiovascular;   Laterality: N/A;   TOTAL KNEE ARTHROPLASTY Left 09/13/2014   Procedure: LEFT TOTAL KNEE ARTHROPLASTY;  Surgeon: Shelda Pal, MD;  Location: [facility];  Service: Orthopedics;  Laterality: Left;    reports that he quit smoking about 17 years ago. His smoking use included cigarettes. He started smoking about 69 years ago. He has a 104 pack-year smoking history. He has never used smokeless tobacco. He reports that he does not currently use alcohol after a past usage of about 6.0 standard drinks of alcohol per week. He reports that he does not use drugs.  Allergies  Allergen Reactions   Other Swelling    Beer- Swelling    Sunflower Oil Swelling   Sulfa Antibiotics Rash    Family History  Problem Relation Age of Onset   Heart attack Brother 78    Prior to Admission medications   Medication Sig Start Date End Date Taking? Authorizing Provider  ADEMPAS 2 MG TABS TAKE 1 TABLET BY MOUTH 3 TIMES A DAY AS DIRECTED. DO NOT HANDLE IF PREGNANT 08/26/23   Laurey Morale, MD  albuterol (VENTOLIN HFA) 108 (90 Base) MCG/ACT inhaler Inhale 1 puff into the lungs every 4 (four) hours as needed for shortness of breath.    [provider]  allopurinol (ZYLOPRIM) 100 MG tablet Take 100 mg by mouth every morning.    [provider]  atorvastatin (LIPITOR) 40 MG tablet TAKE 1 TABLET BY MOUTH EVERY DAY 06/30/23   [place], [place], FNP  Cinnamon 500 MG capsule Take 500 mg by mouth every morning.    [provider]  dapagliflozin propanediol (FARXIGA) 10 MG TABS tablet Take 1 tablet (10 mg total) by mouth daily before breakfast. 02/25/23   [place], [place], FNP  ELIQUIS 5 MG TABS tablet TAKE 1 TABLET BY MOUTH TWICE A DAY 07/21/23   Laurey Morale, MD  finasteride (PROSCAR) 5 MG tablet Take 5 mg by mouth at bedtime.    [provider]  gabapentin (NEURONTIN) 300 MG capsule Take 300 mg by mouth 3 (three) times daily. 04/04/22   [provider]  magnesium oxide (MAG-OX) 400  (240 Mg) MG tablet TAKE 1 TABLET BY MOUTH EVERY DAY 09/29/23   Laurey Morale, MD  metoCLOPramide (REGLAN) 5 MG tablet Take 5 mg by mouth 3 (three) times daily before meals.    [provider]  midodrine (PROAMATINE) 5 MG tablet TAKE 3 TABLETS BY MOUTH 3 TIMES DAILY. 09/05/23   Laurey Morale, MD  Multiple Vitamin (MULTIVITAMIN WITH MINERALS) TABS tablet Take 1 tablet by mouth daily. 05/01/21   Marguerita Merles Latif, DO  OXYGEN Inhale 2 L into the lungs continuous.    [provider]  pantoprazole (PROTONIX) 40 MG tablet Take 1 tablet (40 mg total) by mouth every morning. 03/07/23   Noemi Chapel, NP  Tiotropium Bromide-Olodaterol (STIOLTO RESPIMAT) 2.5-2.5 MCG/ACT AERS Inhale 2 puffs into the lungs daily. 08/04/23   Oretha Milch, MD  torsemide (DEMADEX) 20 MG tablet Take 20 mg by mouth daily.    [provider]     Vitals:   10/10/23 0915 10/10/23 1003 10/10/23 1004 10/10/23 1117  BP: 110/63 102/75    Pulse: (!) 121 (!) 123 (!) 121   Resp: (!) 34 (!) 24 17   Temp:    100 F (37.8 C)  TempSrc:    Oral  SpO2: (!) 89% 95% 100%   Weight:      Height:       Physical Exam   Labs on Admission: I have personally reviewed following labs and imaging studies Results for orders placed or performed during the hospital encounter of 10/10/23 (from the past 24 hours)  Resp panel by RT-PCR (RSV, Flu A&B, Covid) Anterior Nasal Swab     Status: None   Collection Time: 10/10/23  9:17 AM   Specimen: Anterior Nasal Swab  Result Value Ref Range   SARS Coronavirus 2 by RT PCR NEGATIVE NEGATIVE   Influenza A by PCR NEGATIVE NEGATIVE   Influenza B by PCR NEGATIVE NEGATIVE   Resp Syncytial Virus by PCR NEGATIVE NEGATIVE  Comprehensive metabolic panel     Status: Abnormal   Collection Time: 10/10/23  9:35 AM  Result Value Ref Range   Sodium 142 135 - 145 mmol/L   Potassium 4.4 3.5 - 5.1 mmol/L   Chloride 97 (L) 98 - 111 mmol/L   CO2 29 22 - 32 mmol/L   Glucose, Bld 120  (H) 70 - 99 mg/dL   BUN 35 (H) 8 - 23 mg/dL   Creatinine, Ser 8.29 (H) 0.61 - 1.24 mg/dL   Calcium 9.9 8.9 - 56.2 mg/dL   Total Protein 7.3 6.5 - 8.1 g/dL   Albumin 3.7 3.5 - 5.0 g/dL   AST 37 15 - 41 U/L   ALT 14 0 - 44 U/L   Alkaline Phosphatase 78 38 - 126 U/L   Total Bilirubin 1.4 (H) 0.0 - 1.2 mg/dL   GFR, Estimated 33 (L) >60 mL/min   Anion gap 16 (H) 5 - 15  I-Stat Lactic Acid     Status: Abnormal   Collection Time: 10/10/23  9:43 AM  Result Value Ref Range   Lactic Acid, Venous 2.9 (HH) 0.5 - 1.9 mmol/L   Comment NOTIFIED PHYSICIAN   Brain natriuretic peptide     Status: Abnormal   Collection Time: 10/10/23  9:57 AM  Result Value Ref Range   B Natriuretic Peptide 354.9 (H) 0.0 - 100.0 pg/mL  I-Stat Lactic Acid     Status: Abnormal   Collection Time: 10/10/23 11:25 AM  Result Value Ref Range   Lactic Acid, Venous 2.2 (HH) 0.5 - 1.9 mmol/L   Comment NOTIFIED PHYSICIAN    Recent Results (from the past 720 hours)  Resp panel by RT-PCR (RSV, Flu A&B, Covid) Anterior Nasal Swab     Status: None   Collection Time: 10/10/23  9:17 AM   Specimen: Anterior Nasal Swab  Result Value Ref Range Status   SARS Coronavirus 2 by RT PCR NEGATIVE NEGATIVE Final   Influenza A by PCR NEGATIVE NEGATIVE Final   Influenza B by PCR NEGATIVE NEGATIVE Final    Comment: (NOTE) The Xpert Xpress SARS-CoV-2/FLU/RSV plus assay is intended as an aid in the diagnosis of influenza from Nasopharyngeal swab specimens and should not be used  as a sole basis for treatment. Nasal washings and aspirates are unacceptable for Xpert Xpress SARS-CoV-2/FLU/RSV testing.  Fact Sheet for Patients: BloggerCourse.com  Fact Sheet for Healthcare Providers: SeriousBroker.it  This test is not yet approved or cleared by the [place] FDA and has been authorized for detection and/or diagnosis of SARS-CoV-2 by FDA under an Emergency Use Authorization (EUA). This EUA  will remain in effect (meaning this test can be used) for the duration of the COVID-19 declaration under Section 564(b)(1) of the Act, 21 U.S.C. section 360bbb-3(b)(1), unless the authorization is terminated or revoked.     Resp Syncytial Virus by PCR NEGATIVE NEGATIVE Final    Comment: (NOTE) Fact Sheet for Patients: BloggerCourse.com  Fact Sheet for Healthcare Providers: SeriousBroker.it  This test is not yet approved or cleared by the [place] FDA and has been authorized for detection and/or diagnosis of SARS-CoV-2 by FDA under an Emergency Use Authorization (EUA). This EUA will remain in effect (meaning this test can be used) for the duration of the COVID-19 declaration under Section 564(b)(1) of the Act, 21 U.S.C. section 360bbb-3(b)(1), unless the authorization is terminated or revoked.  Performed at [facility], [address] [place], [place] [address]    CBC:    Latest Ref Rng & Units 07/04/2023    9:22 AM 03/15/2023    4:30 AM 03/14/2023   12:39 AM  CBC  WBC 4.0 - 10.5 K/uL 4.6  8.7  6.2   Hemoglobin 13.0 - 17.0 g/dL 19.1  47.8  29.5   Hematocrit 39.0 - 52.0 % 41.1  36.3  36.0   Platelets 150 - 400 K/uL 211  149  135    Basic Metabolic Panel: Recent Labs  Lab 10/10/23 0935  NA 142  K 4.4  CL 97*  CO2 29  GLUCOSE 120*  BUN 35*  CREATININE 1.94*  CALCIUM 9.9   Creatinine: Lab Results  Component Value Date   CREATININE 1.94 (H) 10/10/2023   CREATININE 1.58 (H) 07/04/2023   CREATININE 1.08 03/15/2023   Liver Function Tests:    Latest Ref Rng & Units 10/10/2023    9:35 AM 03/13/2023   11:16 AM 07/19/2022    2:41 AM  Hepatic Function  Total Protein 6.5 - 8.1 g/dL 7.3  7.2  6.3   Albumin 3.5 - 5.0 g/dL 3.7  3.7  2.6   AST 15 - 41 U/L 37  39  21   ALT 0 - 44 U/L 14  20  17    Alk Phosphatase 38 - 126 U/L 78  85  75   Total Bilirubin 0.0 - 1.2 mg/dL 1.4  1.3  0.7    Coagulation  Profile: No results for input(s): "INR", "PROTIME" in the last 168 hours. Cardiac Enzymes: No results for input(s): "CKTOTAL", "CKMB", "CKMBINDEX", "TROPONINI" in the last 168 hours. BNP (last 3 results) No results for input(s): "PROBNP" in the last 8760 hours. HbA1C: No results for input(s): "HGBA1C" in the last 72 hours. Lipid Profile: No results for input(s): "CHOL", "HDL", "LDLCALC", "TRIG", "CHOLHDL", "LDLDIRECT" in the last 72 hours.  Radiological Exams on Admission: DG Chest Portable 1 View Result Date: 10/10/2023 CLINICAL DATA:  Shortness of breath EXAM: PORTABLE CHEST 1 VIEW COMPARISON:  Chest radiograph dated 12/02/2022 FINDINGS: Normal lung volumes. Confluent bibasilar opacities, right-greater-than-left. No pleural effusion or pneumothorax. Enlarged cardiomediastinal silhouette is likely projectional. No acute osseous abnormality. IMPRESSION: Confluent bibasilar opacities, right-greater-than-left, which may represent atelectasis, aspiration, or pneumonia. Electronically Signed  By: Agustin Cree M.D.   On: 10/10/2023 10:22    Data Reviewed: Relevant notes from primary care and specialist visits, past discharge summaries as available in EHR, including Care Everywhere. Prior diagnostic testing as pertinent to current admission diagnoses, Updated medications and problem lists for reconciliation ED course, including vitals, labs, imaging, treatment and response to treatment,Triage notes, nursing and pharmacy notes and ED provider's notes Notable results as noted in HPI.Discussed case with EDMD/ ED APP/ or Specialty MD on call and as needed.  >>Assessment and Plan: No notes have been filed under this hospital service. Service: Hospitalist          DVT prophylaxis:  *** Consults:  *** Advance Care Planning:    Code Status: Prior   Family Communication:  *** Disposition Plan:  *** Severity of Illness: {Observation/Inpatient:21159}  Author: Gertha Calkin, MD 10/10/2023  12:11 PM  For on call review [email or web address].   Unresulted Labs (From admission, onward)     Start     Ordered   10/10/23 1000  CBC with Differential/Platelet  Once,   STAT        10/10/23 1000   10/10/23 0918  CBC with Differential  Once,   STAT        10/10/23 0917   10/10/23 0917  Culture, blood (routine x 2)  BLOOD CULTURE X 2,   R (with STAT occurrences)      10/10/23 0916            Orders Placed This Encounter  Procedures   Culture, blood (routine x 2)   Resp panel by RT-PCR (RSV, Flu A&B, Covid) Anterior Nasal Swab   DG Chest Portable 1 View   Comprehensive metabolic panel   CBC with Differential   Brain natriuretic peptide   CBC with Differential/Platelet   ED Cardiac monitoring   Consult to hospitalist   Bipap   I-Stat Lactic Acid   EKG 12-Lead   ED EKG

## 2023-10-10 NOTE — Plan of Care (Signed)
  Problem: Nutritional: Goal: Maintenance of adequate nutrition will improve Outcome: Progressing   Problem: Tissue Perfusion: Goal: Adequacy of tissue perfusion will improve Outcome: Progressing   

## 2023-10-10 NOTE — ED Notes (Signed)
 Pt is now on 5L of O2

## 2023-10-10 NOTE — ED Triage Notes (Signed)
 Pt from home, EMS called out for [facility] and shakiness/ cough that started 2 days ago. Lungs rhonchi. Room air sat was 70%, pt placed on CPAP. EMS gave 1 nitroglycerin and pts BP went from 140 systolic to 80 systolic.  Hx of CHF.

## 2023-10-10 NOTE — ED Notes (Signed)
 RN sent down repeat purple top to lab

## 2023-10-10 NOTE — Consult Note (Addendum)
 NAME:  Joshua [name], MRN:  [id number], DOB:  August 23, 1935, LOS: 0 ADMISSION DATE:  10/10/2023, CONSULTATION DATE:  10/10/23 REFERRING MD:  Rubin Payor , CHIEF COMPLAINT:  SOB   History of Present Illness:  88 yo M PMH chronic hypoxic resp failure, emphysema, pulmonary hypertension, chronic PE/?CTEPH, CKD, hx aspiration PNA who presented to ED via EMS 2/21 w cough SOB which began 2d PTA. Was given nitroglycerin and placed on BiPAP for sats 70s. Ed ED, temp elevated to 100.5, WBC elevated and Cxr w bilat ASD. Started on vanc flagyl. Soft Bps -- PCCM consulted in this setting   Pertinent  Medical History  Chronic hypoxia Emphysema Pulm HTN RV failure Chronic PE CKD  Chronic hypotension OSA   Significant Hospital Events: Including procedures, antibiotic start and stop dates in addition to other pertinent events   10/10/23 ED w hypoxia PNA. Soft Bps prompting PCCm consult   Interim History / Subjective:  Consulted   Objective   Blood pressure (!) 91/56, pulse 95, temperature (!) 100.4 F (38 C), temperature source Oral, resp. rate 15, height 6\' 3"  (1.905 m), weight 104.3 kg, SpO2 100%.    FiO2 (%):  [60 %] 60 % PEEP:  [8 cmH20] 8 cmH20  No intake or output data in the 24 hours ending 10/10/23 1327 Filed Weights   10/10/23 0902  Weight: 104.3 kg    Examination: General: chronically ill M NAD  HENT: NCAT pink mm  Lungs: even unlabored  Cardiovascular: rr, cap refill is brisk,  Abdomen: soft ndnt  Extremities: no acute joint deformity, no lower extremity edema  Neuro: Awake alert oriented  GU: foley with clear yellow urine   Resolved Hospital Problem list     Assessment & Plan:   Addendum -- have been informed hospitalist not comfortable w admission. Will take to ICU.  Plan of care otherwise remains as below    Severe sepsis 2/2 BLL PNA -soft pressures, but also noted to have chronic hypotension and is Rx midodrine at home P -does not need pressors or ICU admission at  this time  -- mentating well, making great urine, is warm and well perfused.  -pna as below  -also have ordered UA. F/u Bcx  -I have ordered midodrine 10mg  TID  -gentle fluids  AoC hypoxic resp failure CAP Emphysema Pulmonary hypertension  Chronic PE Hx aspiration PNA OSA P  -check RVP, send sputum cx, check MRSA PCR  -rcvd vanc cefepime flagyl in ED -- further abx per primary. narrow abx when appropriate  -have ordered triple therapy while he is here.  -on eliquis at home -on adempas outpt  -- could consider Adv HF involvement this admission if needed.   Elevated LA, improving  -in setting of sepsis above P -follow PRN   BPH Hx urinary retention -would rec holding home retention meds for now   Thrombocytopenia -follow PRN  DNR discussion P -wishes to be DNR; would at least consider full scope of care otherwise.  -will enter this as DNR - pre arrest interventions desired   Best Practice (right click and "Reselect all SmartList Selections" daily)   Diet/type: NPO w/ oral meds DVT prophylaxis per primary. Takes eliquis at home  Pressure ulcer(s): pressure ulcer assessment deferred  GI prophylaxis: PPI at home Lines: N/A Foley:  Yes, and it is still needed Code Status:  DNR Last date of multidisciplinary goals of care discussion [10/10/23]  Labs   CBC: Recent Labs  Lab 10/10/23 1234  WBC 15.4*  NEUTROABS 14.3*  HGB 13.0  HCT 40.7  MCV 85.9  PLT 137*    Basic Metabolic Panel: Recent Labs  Lab 10/10/23 0935  NA 142  K 4.4  CL 97*  CO2 29  GLUCOSE 120*  BUN 35*  CREATININE 1.94*  CALCIUM 9.9   GFR: Estimated Creatinine Clearance: 36.4 mL/min (A) (by C-G formula based on SCr of 1.94 mg/dL (H)). Recent Labs  Lab 10/10/23 0943 10/10/23 1125 10/10/23 1234  WBC  --   --  15.4*  LATICACIDVEN 2.9* 2.2*  --     Liver Function Tests: Recent Labs  Lab 10/10/23 0935  AST 37  ALT 14  ALKPHOS 78  BILITOT 1.4*  PROT 7.3  ALBUMIN 3.7   No  results for input(s): "LIPASE", "AMYLASE" in the last 168 hours. No results for input(s): "AMMONIA" in the last 168 hours.  ABG    Component Value Date/Time   PHART 7.364 07/16/2022 0217   PCO2ART 59.2 (H) 07/16/2022 0217   PO2ART 63 (L) 07/16/2022 0217   HCO3 33.0 (H) 07/16/2022 0217   TCO2 35 (H) 07/16/2022 0217   O2SAT 87 07/16/2022 0217     Coagulation Profile: No results for input(s): "INR", "PROTIME" in the last 168 hours.  Cardiac Enzymes: No results for input(s): "CKTOTAL", "CKMB", "CKMBINDEX", "TROPONINI" in the last 168 hours.  HbA1C: Hgb A1c MFr Bld  Date/Time Value Ref Range Status  03/13/2023 04:40 PM 5.7 (H) 4.8 - 5.6 % Final    Comment:    (NOTE) Pre diabetes:          5.7%-6.4%  Diabetes:              >6.4%  Glycemic control for   <7.0% adults with diabetes   03/16/2022 05:46 PM 5.0 4.8 - 5.6 % Final    Comment:    (NOTE) Pre diabetes:          5.7%-6.4%  Diabetes:              >6.4%  Glycemic control for   <7.0% adults with diabetes     CBG: No results for input(s): "GLUCAP" in the last 168 hours.  Review of Systems:   Review of Systems  Constitutional:  Positive for fever.  Respiratory:  Positive for cough, hemoptysis and shortness of breath.   Cardiovascular:  Negative for chest pain, palpitations and leg swelling.     Past Medical History:  He,  has a past medical history of Arthritis, BPH (benign prostatic hyperplasia), CHF (congestive heart failure) (HCC), Chronic hiccups, COPD (chronic obstructive pulmonary disease) (HCC), Folliculitis, GERD (gastroesophageal reflux disease), Gout, Hypertension, Hypotension, NSTEMI (non-ST elevated myocardial infarction) (HCC) (03/19/2022), PE (pulmonary thromboembolism) (HCC), Phlebitis, Pneumonia, Pulmonary embolism (HCC) (03/17/2022), Pulmonary hypertension (HCC), Severe sepsis (HCC) (03/16/2022), and Sleep apnea.   Surgical History:   Past Surgical History:  Procedure Laterality Date   BIOPSY   11/03/2020   Procedure: BIOPSY;  Surgeon: Jeani Hawking, MD;  Location: [facility];  Service: Endoscopy;;   BIOPSY  07/06/2021   Procedure: BIOPSY;  Surgeon: Jeani Hawking, MD;  Location: [facility];  Service: Endoscopy;;   bone removed from little toe right foot      CHOLECYSTECTOMY     ESOPHAGOGASTRODUODENOSCOPY (EGD) WITH PROPOFOL N/A 11/03/2020   Procedure: ESOPHAGOGASTRODUODENOSCOPY (EGD) WITH PROPOFOL;  Surgeon: Jeani Hawking, MD;  Location: [facility];  Service: Endoscopy;  Laterality: N/A;   ESOPHAGOGASTRODUODENOSCOPY (EGD) WITH PROPOFOL N/A 07/06/2021   Procedure: ESOPHAGOGASTRODUODENOSCOPY (EGD) WITH PROPOFOL;  Surgeon: Jeani Hawking,  MD;  Location: [facility];  Service: Endoscopy;  Laterality: N/A;   pilonidal cyst removal      RIGHT HEART CATH N/A 12/13/2020   Procedure: RIGHT HEART CATH;  Surgeon: Laurey Morale, MD;  Location: [facility] INVASIVE CV LAB;  Service: Cardiovascular;  Laterality: N/A;   RIGHT/LEFT HEART CATH AND CORONARY ANGIOGRAPHY N/A 03/21/2022   Procedure: RIGHT/LEFT HEART CATH AND CORONARY ANGIOGRAPHY;  Surgeon: Laurey Morale, MD;  Location: [facility] INVASIVE CV LAB;  Service: Cardiovascular;  Laterality: N/A;   TOTAL KNEE ARTHROPLASTY Left 09/13/2014   Procedure: LEFT TOTAL KNEE ARTHROPLASTY;  Surgeon: Shelda Pal, MD;  Location: [facility];  Service: Orthopedics;  Laterality: Left;     Social History:   reports that he quit smoking about 17 years ago. His smoking use included cigarettes. He started smoking about 69 years ago. He has a 104 pack-year smoking history. He has never used smokeless tobacco. He reports that he does not currently use alcohol after a past usage of about 6.0 standard drinks of alcohol per week. He reports that he does not use drugs.   Family History:  His family history includes Heart attack (age of onset: 22) in his brother.   Allergies Allergies  Allergen Reactions   Other Swelling    Beer- Swelling    Sunflower Oil Swelling   Sulfa  Antibiotics Rash     Home Medications  Prior to Admission medications   Medication Sig Start Date End Date Taking? Authorizing Provider  ADEMPAS 2 MG TABS TAKE 1 TABLET BY MOUTH 3 TIMES A DAY AS DIRECTED. DO NOT HANDLE IF PREGNANT 08/26/23  Yes Laurey Morale, MD  albuterol (VENTOLIN HFA) 108 (90 Base) MCG/ACT inhaler Inhale 1 puff into the lungs every 4 (four) hours as needed for shortness of breath.   Yes [provider]  allopurinol (ZYLOPRIM) 100 MG tablet Take 100 mg by mouth every morning.   Yes [provider]  atorvastatin (LIPITOR) 40 MG tablet TAKE 1 TABLET BY MOUTH EVERY DAY 06/30/23  Yes Milford, Leland, FNP  azelastine (ASTELIN) 0.1 % nasal spray Place 2 sprays into both nostrils 2 (two) times daily as needed for rhinitis. Use in each nostril as directed   Yes [provider]  Cinnamon 500 MG capsule Take 500 mg by mouth every morning.   Yes [provider]  dapagliflozin propanediol (FARXIGA) 10 MG TABS tablet Take 1 tablet (10 mg total) by mouth daily before breakfast. 02/25/23  Yes Milford, Anderson Malta, FNP  ELIQUIS 5 MG TABS tablet TAKE 1 TABLET BY MOUTH TWICE A DAY 07/21/23  Yes Laurey Morale, MD  finasteride (PROSCAR) 5 MG tablet Take 5 mg by mouth at bedtime.   Yes [provider]  gabapentin (NEURONTIN) 300 MG capsule Take 300 mg by mouth 3 (three) times daily. 04/04/22  Yes [provider]  magnesium oxide (MAG-OX) 400 (240 Mg) MG tablet TAKE 1 TABLET BY MOUTH EVERY DAY 09/29/23  Yes Laurey Morale, MD  metoCLOPramide (REGLAN) 5 MG tablet Take 5 mg by mouth 3 (three) times daily before meals.   Yes [provider]  midodrine (PROAMATINE) 5 MG tablet TAKE 3 TABLETS BY MOUTH 3 TIMES DAILY. Patient taking differently: Take 5 mg by mouth daily after lunch. 09/05/23  Yes Laurey Morale, MD  Multiple Vitamin (MULTIVITAMIN WITH MINERALS) TABS tablet Take 1 tablet by mouth daily. 05/01/21  Yes Sheikh, Omair Latif, DO   OXYGEN Inhale 2 L into the  lungs continuous.   Yes [provider]  pantoprazole (PROTONIX) 40 MG tablet Take 1 tablet (40 mg total) by mouth every morning. 03/07/23  Yes Cobb, Ruby Cola, NP  potassium chloride (KLOR-CON) 10 MEQ tablet Take 10 mEq by mouth 2 (two) times daily.   Yes [provider]  Tiotropium Bromide-Olodaterol (STIOLTO RESPIMAT) 2.5-2.5 MCG/ACT AERS Inhale 2 puffs into the lungs daily. Patient taking differently: Inhale 1 puff into the lungs daily. 08/04/23  Yes Oretha Milch, MD  torsemide (DEMADEX) 20 MG tablet Take 20 mg by mouth daily.   Yes [provider]     Critical care time: na       High MDM   Tessie Fass MSN, AGACNP-BC [facility] Pulmonary/Critical Care Medicine Amion for pager 10/10/2023, 2:11 PM

## 2023-10-10 NOTE — ED Provider Notes (Addendum)
 [place] EMERGENCY DEPARTMENT AT [facility] Provider Note   CSN: [id number] Arrival date & time: 10/10/23  2952     History  Chief Complaint  Patient presents with   Respiratory Distress    Joshua [name] is a 88 y.o. male.  HPI Patient presents with shortness of breath.  Has had since yesterday.  Cough.  Also pulmonary hypertension.  EMS being called.  Reportedly had sats of 56% upon arrival to the ER although was not on oxygen when he is on chronic oxygen.  Had been on CPAP for EMS.  Has had a cough with occasional sputum production.   Past Medical History:  Diagnosis Date   Arthritis    BPH (benign prostatic hyperplasia)    CHF (congestive heart failure) (HCC)    Chronic hiccups    COPD (chronic obstructive pulmonary disease) (HCC)    Folliculitis    posterior scalp per office visit note of Dr Selena Batten 07/20/2014     GERD (gastroesophageal reflux disease)    Gout    Hypertension    Hypotension    NSTEMI (non-ST elevated myocardial infarction) (HCC) 03/19/2022   PE (pulmonary thromboembolism) (HCC)    Phlebitis    right arm  at least 20 years ago    Pneumonia    hx of pneumonia as a child    Pulmonary embolism (HCC) 03/17/2022   Pulmonary hypertension (HCC)    Severe sepsis (HCC) 03/16/2022   Sleep apnea     Home Medications Prior to Admission medications   Medication Sig Start Date End Date Taking? Authorizing Provider  ADEMPAS 2 MG TABS TAKE 1 TABLET BY MOUTH 3 TIMES A DAY AS DIRECTED. DO NOT HANDLE IF PREGNANT 08/26/23  Yes Laurey Morale, MD  albuterol (VENTOLIN HFA) 108 (90 Base) MCG/ACT inhaler Inhale 1 puff into the lungs every 4 (four) hours as needed for shortness of breath.   Yes [provider]  allopurinol (ZYLOPRIM) 100 MG tablet Take 100 mg by mouth every morning.   Yes [provider]  atorvastatin (LIPITOR) 40 MG tablet TAKE 1 TABLET BY MOUTH EVERY DAY 06/30/23  Yes Milford, Concordia, FNP  azelastine (ASTELIN) 0.1 %  nasal spray Place 2 sprays into both nostrils 2 (two) times daily as needed for rhinitis. Use in each nostril as directed   Yes [provider]  Cinnamon 500 MG capsule Take 500 mg by mouth every morning.   Yes [provider]  dapagliflozin propanediol (FARXIGA) 10 MG TABS tablet Take 1 tablet (10 mg total) by mouth daily before breakfast. 02/25/23  Yes Milford, Anderson Malta, FNP  ELIQUIS 5 MG TABS tablet TAKE 1 TABLET BY MOUTH TWICE A DAY 07/21/23  Yes Laurey Morale, MD  finasteride (PROSCAR) 5 MG tablet Take 5 mg by mouth at bedtime.   Yes [provider]  gabapentin (NEURONTIN) 300 MG capsule Take 300 mg by mouth 3 (three) times daily. 04/04/22  Yes [provider]  magnesium oxide (MAG-OX) 400 (240 Mg) MG tablet TAKE 1 TABLET BY MOUTH EVERY DAY 09/29/23  Yes Laurey Morale, MD  metoCLOPramide (REGLAN) 5 MG tablet Take 5 mg by mouth 3 (three) times daily before meals.   Yes [provider]  midodrine (PROAMATINE) 5 MG tablet TAKE 3 TABLETS BY MOUTH 3 TIMES DAILY. Patient taking differently: Take 5 mg by mouth daily after lunch. 09/05/23  Yes Laurey Morale, MD  Multiple Vitamin (MULTIVITAMIN WITH MINERALS) TABS tablet Take 1 tablet  by mouth daily. 05/01/21  Yes Sheikh, Omair Latif, DO  OXYGEN Inhale 2 L into the lungs continuous.   Yes [provider]  pantoprazole (PROTONIX) 40 MG tablet Take 1 tablet (40 mg total) by mouth every morning. 03/07/23  Yes Cobb, Ruby Cola, NP  potassium chloride (KLOR-CON) 10 MEQ tablet Take 10 mEq by mouth 2 (two) times daily.   Yes [provider]  Tiotropium Bromide-Olodaterol (STIOLTO RESPIMAT) 2.5-2.5 MCG/ACT AERS Inhale 2 puffs into the lungs daily. Patient taking differently: Inhale 1 puff into the lungs daily. 08/04/23  Yes Oretha Milch, MD  torsemide (DEMADEX) 20 MG tablet Take 20 mg by mouth daily.   Yes [provider]      Allergies    Other, Sunflower oil, and Sulfa antibiotics     Review of Systems   Review of Systems  Physical Exam Updated Vital Signs BP (!) 85/51 (BP Location: Right Arm)   Pulse 90   Temp 99.6 F (37.6 C) (Temporal)   Resp 15   Ht 6\' 3"  (1.905 m)   Wt 104.3 kg   SpO2 95%   BMI 28.75 kg/m  Physical Exam Vitals and nursing note reviewed.  Cardiovascular:     Rate and Rhythm: Tachycardia present.  Pulmonary:     Comments: Diffuse harsh breath sounds with tachypnea. Abdominal:     General: There is no distension.  Musculoskeletal:     Right lower leg: No edema.     Left lower leg: No edema.  Skin:    General: Skin is warm.  Neurological:     Mental Status: He is alert and oriented to person, place, and time.     ED Results / Procedures / Treatments   Labs (all labs ordered are listed, but only abnormal results are displayed) Labs Reviewed  COMPREHENSIVE METABOLIC PANEL - Abnormal; Notable for the following components:      Result Value   Chloride 97 (*)    Glucose, Bld 120 (*)    BUN 35 (*)    Creatinine, Ser 1.94 (*)    Total Bilirubin 1.4 (*)    GFR, Estimated 33 (*)    Anion gap 16 (*)    All other components within normal limits  BRAIN NATRIURETIC PEPTIDE - Abnormal; Notable for the following components:   B Natriuretic Peptide 354.9 (*)    All other components within normal limits  CBC WITH DIFFERENTIAL/PLATELET - Abnormal; Notable for the following components:   WBC 15.4 (*)    RDW 17.5 (*)    Platelets 137 (*)    Neutro Abs 14.3 (*)    Lymphs Abs 0.3 (*)    All other components within normal limits  URINALYSIS, ROUTINE W REFLEX MICROSCOPIC - Abnormal; Notable for the following components:   Glucose, UA >=500 (*)    Bacteria, UA RARE (*)    All other components within normal limits  I-STAT CG4 LACTIC ACID, ED - Abnormal; Notable for the following components:   Lactic Acid, Venous 2.9 (*)    All other components within normal limits  I-STAT CG4 LACTIC ACID, ED - Abnormal; Notable for the following  components:   Lactic Acid, Venous 2.2 (*)    All other components within normal limits  RESP PANEL BY RT-PCR (RSV, FLU A&B, COVID)  RVPGX2  CULTURE, BLOOD (ROUTINE X 2)  CULTURE, BLOOD (ROUTINE X 2)  RESPIRATORY PANEL BY PCR  EXPECTORATED SPUTUM ASSESSMENT W GRAM STAIN, RFLX TO RESP C  MRSA NEXT GEN  BY PCR, NASAL  CBC WITH DIFFERENTIAL/PLATELET  STREP PNEUMONIAE URINARY ANTIGEN  LEGIONELLA PNEUMOPHILA SEROGP 1 UR AG  HEMOGLOBIN A1C    EKG EKG Interpretation Date/Time:  Friday October 10 2023 09:09:16 EST Ventricular Rate:  126 PR Interval:    QRS Duration:  158 QT Interval:  371 QTC Calculation: 538 R Axis:   205  Text Interpretation: Sinus tachycardia RBBB and LPFB Confirmed by Benjiman Core [phone]) on 10/10/2023 10:26:54 AM  Radiology DG Chest Portable 1 View Result Date: 10/10/2023 CLINICAL DATA:  Shortness of breath EXAM: PORTABLE CHEST 1 VIEW COMPARISON:  Chest radiograph dated 12/02/2022 FINDINGS: Normal lung volumes. Confluent bibasilar opacities, right-greater-than-left. No pleural effusion or pneumothorax. Enlarged cardiomediastinal silhouette is likely projectional. No acute osseous abnormality. IMPRESSION: Confluent bibasilar opacities, right-greater-than-left, which may represent atelectasis, aspiration, or pneumonia. Electronically Signed   By: Agustin Cree M.D.   On: 10/10/2023 10:22    Procedures Procedures    Medications Ordered in ED Medications  midodrine (PROAMATINE) tablet 10 mg (10 mg Oral Given 10/10/23 1419)  budesonide (PULMICORT) nebulizer solution 0.5 mg (has no administration in time range)  arformoterol (BROVANA) nebulizer solution 15 mcg (has no administration in time range)  revefenacin (YUPELRI) nebulizer solution 175 mcg (175 mcg Nebulization Given 10/10/23 1437)  docusate sodium (COLACE) capsule 100 mg (has no administration in time range)  polyethylene glycol (MIRALAX / GLYCOLAX) packet 17 g (has no administration in time range)   pantoprazole (PROTONIX) EC tablet 40 mg (has no administration in time range)  insulin aspart (novoLOG) injection 0-9 Units (has no administration in time range)  insulin aspart (novoLOG) injection 0-5 Units (has no administration in time range)  lactated ringers bolus 1,000 mL (has no administration in time range)  vancomycin (VANCOCIN) IVPB 1000 mg/200 mL premix (0 mg Intravenous Stopped 10/10/23 1105)    And  vancomycin (VANCOCIN) IVPB 1000 mg/200 mL premix (0 mg Intravenous Stopped 10/10/23 1105)  ceFEPIme (MAXIPIME) 2 g in sodium chloride 0.9 % 100 mL IVPB (0 g Intravenous Stopped 10/10/23 1058)  metroNIDAZOLE (FLAGYL) IVPB 500 mg (0 mg Intravenous Stopped 10/10/23 1106)  sodium chloride 0.9 % bolus 250 mL (0 mLs Intravenous Stopped 10/10/23 1132)  sodium chloride 0.9 % bolus 500 mL (0 mLs Intravenous Stopped 10/10/23 1315)  acetaminophen (TYLENOL) tablet 500 mg (500 mg Oral Given 10/10/23 1252)    ED Course/ Medical Decision Making/ A&P                                 Medical Decision Making Amount and/or Complexity of Data Reviewed Labs: ordered. Radiology: ordered.  Risk OTC drugs. Prescription drug management. Decision regarding hospitalization.   Patient shortness of breath and rather severe hypoxia.  Was on 15 L nasal cannula and still sats of 86%.  Started on BiPAP and feeling somewhat better.  Differential diagnose includes viral infection, pneumonia, pulmonary hypertension/COPD.  Also CHF.  He does not have peripheral edema on his legs.  Will get x-ray.  Reviewed previous pulmonary note.  Reviewed previous discharge with pneumonia.  Had been diagnosed with aspiration pneumonia.  Discussed with pharmacist and will give cefepime Flagyl and Vanco initially.  Has a temperature of 100.5.  Will get blood cultures and lactic acid.  Reported really became hypotensive with EMS after giving nitroglycerin.   CMP shows creatinine mildly increased compared to 3 months ago.  BNP mildly  elevated also.  Lactic acid of 2.9.  Patient  feeling better on BiPAP.  Antibiotics have been given.  Will discuss with hospitalist for admission.  CRITICAL CARE Performed by: Benjiman Core Total critical care time: 30 minutes Critical care time was exclusive of separately billable procedures and treating other patients. Critical care was necessary to treat or prevent imminent or life-threatening deterioration. Critical care was time spent personally by me on the following activities: development of treatment plan with patient and/or surrogate as well as nursing, discussions with consultants, evaluation of patient's response to treatment, examination of patient, obtaining history from patient or surrogate, ordering and performing treatments and interventions, ordering and review of laboratory studies, ordering and review of radiographic studies, pulse oximetry and re-evaluation of patient's condition.   Dr. Allena Katz called back for admission.  Patient now more hypotensive.  Patient feeling better.  Heart rate is come down.  I think likely is volume depleted with creatinine being up.  Will give more fluid bolus.  Fluid infusing but still hypotensive.  Patient states his pressure will often be in the 90s but this is low for him.  Appears to be on midodrine.  With pneumonia and worsening hypotension will discuss with ICU for admission.  ICU was seen patient.  Hypotension is somewhat chronic.  Thinks could be medicine admission.  Blood pressure is decreased to 70/40.  Hospitalist does not feel comfortable admitting this and now ICU will admit.         Final Clinical Impression(s) / ED Diagnoses Final diagnoses:  Pneumonia of both lungs due to infectious organism, unspecified part of lung  Acute on chronic respiratory failure with hypoxia [facility])    Rx / DC Orders ED Discharge Orders     None         Benjiman Core, MD 10/10/23 1051    Benjiman Core, MD 10/10/23  [phone]

## 2023-10-10 NOTE — ED Notes (Signed)
Pt placed on cpap at this time 

## 2023-10-11 ENCOUNTER — Other Ambulatory Visit (HOSPITAL_COMMUNITY): Payer: Medicare Other

## 2023-10-11 ENCOUNTER — Inpatient Hospital Stay (HOSPITAL_COMMUNITY): Payer: Medicare Other

## 2023-10-11 DIAGNOSIS — J9621 Acute and chronic respiratory failure with hypoxia: Secondary | ICD-10-CM

## 2023-10-11 DIAGNOSIS — N1831 Chronic kidney disease, stage 3a: Secondary | ICD-10-CM

## 2023-10-11 DIAGNOSIS — N179 Acute kidney failure, unspecified: Secondary | ICD-10-CM

## 2023-10-11 DIAGNOSIS — J189 Pneumonia, unspecified organism: Secondary | ICD-10-CM | POA: Diagnosis not present

## 2023-10-11 DIAGNOSIS — A419 Sepsis, unspecified organism: Secondary | ICD-10-CM

## 2023-10-11 DIAGNOSIS — I5032 Chronic diastolic (congestive) heart failure: Secondary | ICD-10-CM | POA: Diagnosis not present

## 2023-10-11 DIAGNOSIS — I5031 Acute diastolic (congestive) heart failure: Secondary | ICD-10-CM

## 2023-10-11 LAB — EXPECTORATED SPUTUM ASSESSMENT W GRAM STAIN, RFLX TO RESP C

## 2023-10-11 LAB — CBC
HCT: 39.1 % (ref 39.0–52.0)
Hemoglobin: 12.4 g/dL — ABNORMAL LOW (ref 13.0–17.0)
MCH: 27.1 pg (ref 26.0–34.0)
MCHC: 31.7 g/dL (ref 30.0–36.0)
MCV: 85.6 fL (ref 80.0–100.0)
Platelets: 126 10*3/uL — ABNORMAL LOW (ref 150–400)
RBC: 4.57 MIL/uL (ref 4.22–5.81)
RDW: 17.7 % — ABNORMAL HIGH (ref 11.5–15.5)
WBC: 16.7 10*3/uL — ABNORMAL HIGH (ref 4.0–10.5)
nRBC: 0 % (ref 0.0–0.2)

## 2023-10-11 LAB — BASIC METABOLIC PANEL
Anion gap: 9 (ref 5–15)
BUN: 28 mg/dL — ABNORMAL HIGH (ref 8–23)
CO2: 29 mmol/L (ref 22–32)
Calcium: 9.6 mg/dL (ref 8.9–10.3)
Chloride: 99 mmol/L (ref 98–111)
Creatinine, Ser: 1.48 mg/dL — ABNORMAL HIGH (ref 0.61–1.24)
GFR, Estimated: 46 mL/min — ABNORMAL LOW (ref >60)
Glucose, Bld: 112 mg/dL — ABNORMAL HIGH (ref 70–99)
Potassium: 4.1 mmol/L (ref 3.5–5.1)
Sodium: 137 mmol/L (ref 135–145)

## 2023-10-11 LAB — GLUCOSE, CAPILLARY
Glucose-Capillary: 106 mg/dL — ABNORMAL HIGH (ref 70–99)
Glucose-Capillary: 137 mg/dL — ABNORMAL HIGH (ref 70–99)

## 2023-10-11 LAB — ECHOCARDIOGRAM COMPLETE
AR max vel: 3.34 cm2
AV Area VTI: 3.09 cm2
AV Area mean vel: 3.05 cm2
AV Mean grad: 3 mmHg
AV Peak grad: 5.3 mmHg
Ao pk vel: 1.15 m/s
Area-P 1/2: 3.5 cm2
Height: 75 in
S' Lateral: 2.4 cm
Weight: 3679.04 [oz_av]

## 2023-10-11 MED ORDER — METOCLOPRAMIDE HCL 5 MG/ML IJ SOLN
INTRAMUSCULAR | Status: AC
  Filled 2023-10-11: qty 2

## 2023-10-11 MED ORDER — HEPARIN SODIUM (PORCINE) 5000 UNIT/ML IJ SOLN: 5000.0000 [IU] | Freq: Three times a day (TID) | INTRAMUSCULAR | Status: DC

## 2023-10-11 MED ORDER — SODIUM CHLORIDE 0.9 % IV SOLN
3.0000 g | Freq: Four times a day (QID) | INTRAVENOUS | Status: AC
  Administered 2023-10-11 – 2023-10-16 (×20): 3 g via INTRAVENOUS
  Filled 2023-10-11 (×20): qty 8

## 2023-10-11 MED ORDER — APIXABAN 5 MG PO TABS
5.0000 mg | ORAL_TABLET | Freq: Two times a day (BID) | ORAL | Status: DC
  Administered 2023-10-11 – 2023-10-20 (×19): 5 mg via ORAL
  Filled 2023-10-11 (×19): qty 1

## 2023-10-11 MED ORDER — METOCLOPRAMIDE HCL 5 MG/ML IJ SOLN
10.0000 mg | Freq: Four times a day (QID) | INTRAMUSCULAR | Status: DC | PRN
  Administered 2023-10-11 – 2023-10-17 (×9): 10 mg via INTRAVENOUS
  Filled 2023-10-11 (×9): qty 2

## 2023-10-11 NOTE — Plan of Care (Signed)
   Problem: Education: Goal: Ability to describe self-care measures that may prevent or decrease complications (Diabetes Survival Skills Education) will improve Outcome: Progressing   Problem: Coping: Goal: Ability to adjust to condition or change in health will improve Outcome: Progressing   Problem: Fluid Volume: Goal: Ability to maintain a balanced intake and output will improve Outcome: Progressing

## 2023-10-11 NOTE — Progress Notes (Signed)
 Echocardiogram 2D Echocardiogram has been performed.  Joshua [name] 10/11/2023, 2:48 PM

## 2023-10-11 NOTE — Progress Notes (Signed)
 NAME:  Joshua [name], MRN:  [id number], DOB:  Oct 21, 1935, LOS: 1 ADMISSION DATE:  10/10/2023, CONSULTATION DATE:  10/10/23 REFERRING MD:  Rubin Payor , CHIEF COMPLAINT:  SOB   History of Present Illness:  88 yo M PMH chronic hypoxic resp failure, emphysema, pulmonary hypertension, chronic PE/?CTEPH, CKD, hx aspiration PNA who presented to ED via EMS 2/21 w cough SOB which began 2d PTA. Was given nitroglycerin and placed on BiPAP for sats 70s. Ed ED, temp elevated to 100.5, WBC elevated and Cxr w bilat ASD. Started on vanc flagyl. Soft Bps -- PCCM consulted in this setting   Pertinent  Medical History  Chronic hypoxia Emphysema Pulm HTN RV failure Chronic PE CKD  Chronic hypotension OSA   Significant Hospital Events: Including procedures, antibiotic start and stop dates in addition to other pertinent events   10/10/23 ED w hypoxia PNA. Soft Bps prompting PCCm consult   Interim History / Subjective:  Patient is off vasopressors, blood pressure has been stabilized Denies hemoptysis today, stated still has cough with greenish-yellow sputum Also complaining of hiccups  Objective   Blood pressure (!) 97/56, pulse 89, temperature 99.1 F (37.3 C), temperature source Axillary, resp. rate (!) 7, height 6\' 3"  (1.905 m), weight 104.3 kg, SpO2 94%.        Intake/Output Summary (Last 24 hours) at 10/11/2023 1155 Last data filed at 10/11/2023 1000 Gross per 24 hour  Intake 1487.75 ml  Output 1375 ml  Net 112.75 ml   Filed Weights   10/10/23 0902 10/11/23 0500  Weight: 104.3 kg 104.3 kg    Examination: General: Chronically ill-appearing male, sitting on recliner HEENT: [place]/AT, eyes anicteric.  moist mucus membranes Neuro: Alert, awake following commands Chest: Reduced air entry at the bases right more than left, no wheezes or rhonchi Heart: Regular rate and rhythm, no murmurs or gallops Abdomen: Soft, nontender, nondistended, bowel sounds present Skin: No rash  Labs and images  reviewed  Resolved Hospital Problem list     Assessment & Plan:  Severe sepsis 2/2 BLL PNA, could be aspiration Sepsis improved Switch antibiotic to Unasyn MRSA screen is negative, stop vancomycin Follow-up respiratory culture and adjust antibiotic accordingly Continue midodrine, which patient takes chronically  AoC hypoxic resp failure CAP Emphysema Chronic HFpEF with RV failure and pulmonary hypertension  Chronic PE on anticoagulation OSA Respiratory pathogen panel is negative Continue to titrate nasal cannula oxygen with O2 sat goal 92% On antibiotics Continue inhalers Restart Eliquis as patient had multiple PEs in the past Outpatient follow-up with advanced heart failure  Acute kidney injury on CKD stage IIIa due to septic ATN Serum creatinine started coming down Monitor intake and output Avoid nephrotoxic agent Hold further IV fluid considering chronic RV failure  Lactic acidosis Lactate has improved down to 2.2  Thrombocytopenia due to critical illness Closely monitor platelet count and signs of bleeding   Best Practice (right click and "Reselect all SmartList Selections" daily)   Diet/type: Regular consistency DVT prophylaxis Eliquis Pressure ulcer(s): pressure ulcer assessment deferred  GI prophylaxis: PPI at home Lines: N/A Foley:  Yes, and it is still needed Code Status:  DNR Last date of multidisciplinary goals of care discussion [10/10/23]  Labs   CBC: Recent Labs  Lab 10/10/23 1234 10/11/23 0842  WBC 15.4* 16.7*  NEUTROABS 14.3*  --   HGB 13.0 12.4*  HCT 40.7 39.1  MCV 85.9 85.6  PLT 137* 126*    Basic Metabolic Panel: Recent Labs  Lab 10/10/23 0935 10/11/23 4098  NA 142 137  K 4.4 4.1  CL 97* 99  CO2 29 29  GLUCOSE 120* 112*  BUN 35* 28*  CREATININE 1.94* 1.48*  CALCIUM 9.9 9.6   GFR: Estimated Creatinine Clearance: 47.7 mL/min (A) (by C-G formula based on SCr of 1.48 mg/dL (H)). Recent Labs  Lab 10/10/23 0943  10/10/23 1125 10/10/23 1234 10/11/23 0842  WBC  --   --  15.4* 16.7*  LATICACIDVEN 2.9* 2.2*  --   --     Liver Function Tests: Recent Labs  Lab 10/10/23 0935  AST 37  ALT 14  ALKPHOS 78  BILITOT 1.4*  PROT 7.3  ALBUMIN 3.7   No results for input(s): "LIPASE", "AMYLASE" in the last 168 hours. No results for input(s): "AMMONIA" in the last 168 hours.  ABG    Component Value Date/Time   PHART 7.364 07/16/2022 0217   PCO2ART 59.2 (H) 07/16/2022 0217   PO2ART 63 (L) 07/16/2022 0217   HCO3 33.0 (H) 07/16/2022 0217   TCO2 35 (H) 07/16/2022 0217   O2SAT 87 07/16/2022 0217     Coagulation Profile: No results for input(s): "INR", "PROTIME" in the last 168 hours.  Cardiac Enzymes: No results for input(s): "CKTOTAL", "CKMB", "CKMBINDEX", "TROPONINI" in the last 168 hours.  HbA1C: Hgb A1c MFr Bld  Date/Time Value Ref Range Status  10/10/2023 04:48 PM 5.5 4.8 - 5.6 % Final    Comment:    (NOTE) Pre diabetes:          5.7%-6.4%  Diabetes:              >6.4%  Glycemic control for   <7.0% adults with diabetes   03/13/2023 04:40 PM 5.7 (H) 4.8 - 5.6 % Final    Comment:    (NOTE) Pre diabetes:          5.7%-6.4%  Diabetes:              >6.4%  Glycemic control for   <7.0% adults with diabetes     CBG: Recent Labs  Lab 10/10/23 2122 10/11/23 0830  GLUCAP 169* 106*      Cheri Fowler, MD [facility] See Amion for pager If no response to pager, please call [phone] until 7pm After 7pm, Please call E-link [phone]

## 2023-10-12 DIAGNOSIS — J9621 Acute and chronic respiratory failure with hypoxia: Secondary | ICD-10-CM | POA: Diagnosis not present

## 2023-10-12 LAB — LEGIONELLA PNEUMOPHILA SEROGP 1 UR AG: L. pneumophila Serogp 1 Ur Ag: NEGATIVE

## 2023-10-12 NOTE — Plan of Care (Signed)
  Problem: Education: Goal: Ability to describe self-care measures that may prevent or decrease complications (Diabetes Survival Skills Education) will improve Outcome: Progressing Goal: Individualized Educational Video(s) Outcome: Progressing   Problem: Coping: Goal: Ability to adjust to condition or change in health will improve Outcome: Progressing   Problem: Fluid Volume: Goal: Ability to maintain a balanced intake and output will improve Outcome: Progressing   Problem: Health Behavior/Discharge Planning: Goal: Ability to identify and utilize available resources and services will improve Outcome: Progressing Goal: Ability to manage health-related needs will improve Outcome: Progressing   Problem: Metabolic: Goal: Ability to maintain appropriate glucose levels will improve Outcome: Progressing   Problem: Nutritional: Goal: Maintenance of adequate nutrition will improve Outcome: Progressing Goal: Progress toward achieving an optimal weight will improve Outcome: Progressing   Problem: Skin Integrity: Goal: Risk for impaired skin integrity will decrease Outcome: Progressing   Problem: Tissue Perfusion: Goal: Adequacy of tissue perfusion will improve Outcome: Progressing   Problem: Clinical Measurements: Goal: Ability to maintain clinical measurements within normal limits will improve Outcome: Progressing Goal: Will remain free from infection Outcome: Progressing Goal: Diagnostic test results will improve Outcome: Progressing Goal: Respiratory complications will improve Outcome: Progressing Goal: Cardiovascular complication will be avoided Outcome: Progressing   Problem: Activity: Goal: Risk for activity intolerance will decrease Outcome: Progressing   Problem: Nutrition: Goal: Adequate nutrition will be maintained Outcome: Progressing   Problem: Elimination: Goal: Will not experience complications related to bowel motility Outcome: Progressing Goal: Will  not experience complications related to urinary retention Outcome: Progressing   Problem: Pain Managment: Goal: General experience of comfort will improve and/or be controlled Outcome: Progressing

## 2023-10-12 NOTE — Progress Notes (Signed)
 PROGRESS NOTE    Joshua [name]  WGN:[id number] DOB: 01-04-1936 DOA: 10/10/2023 PCP: [name], DO   Brief Narrative:  88 year old male with chronic respiratory failure with hypoxia, emphysema, pulmonary hypertension, chronic PE, CKD, aspiration pneumonia, chronic hypotension, OSA presented with worsening shortness of breath.  On presentation, he was hypoxic and required BiPAP for oxygen saturations of 70s.  He was febrile with leukocytosis and chest x-ray suggestive of possible pneumonia.  He was started on broad-spectrum antibiotics.  Because of persistent hypotension, he was started on pressors and admitted to ICU under PCCM service.  Subsequently, vasopressors have been weaned off with improving blood pressure.  Care has been transferred to [facility] service from 10/12/2023 onwards.  Assessment & Plan:   Septic shock: Present on admission Bilateral pneumonia with possibility of aspiration pneumonia -Presented with fever, leukocytosis with possible pneumonia and was persistently hypotensive requiring pressors and admission to ICU under PCCM service. -Cultures have been negative so far.  Respiratory panel PCR negative.  Influenza/COVID/RSV PCR negative.  Off pressors.  Blood pressure on the lower side but stable. -Care has been transferred to [facility] service from 10/12/2023 onwards. -Antibiotics have been switched to IV Unasyn.  SLP evaluation  Acute on chronic hypoxic respiratory failure COPD/emphysema -Required BiPAP on presentation.  Currently on 5 L high flow nasal cannula oxygen.  Wean off as able. -Continue current nebulized regimen  Chronic hypotension -Continue midodrine  Chronic diastolic heart failure and pulmonary hypertension -Echo showed EF of 65 to 70% with grade 1 diastolic dysfunction.  Continue strict input and output.  Daily weights.  Fluid restriction. -Outpatient follow-up with cardiology  Chronic PE -Continue Eliquis  AKI on CKD stage IIIa -Creatinine improving.   Labs pending today.  Leukocytosis -Labs pending today  Lactic acidosis -Improving  Thrombocytopenia -Labs pending today.  Physical deconditioning -PT eval    DVT prophylaxis: Eliquis Code Status: DNR Family Communication: None at bedside Disposition Plan: Status is: Inpatient Remains inpatient appropriate because: Of severity of illness   Consultants: PCCM  Procedures: 2D echo  Antimicrobials:  Anti-infectives (From admission, onward)    Start     Dose/Rate Route Frequency Ordered Stop   10/11/23 1245  Ampicillin-Sulbactam (UNASYN) 3 g in sodium chloride 0.9 % 100 mL IVPB        3 g 200 mL/hr over 30 Minutes Intravenous Every 6 hours 10/11/23 1146     10/10/23 2200  ceFEPIme (MAXIPIME) 2 g in sodium chloride 0.9 % 100 mL IVPB  Status:  Discontinued        2 g 200 mL/hr over 30 Minutes Intravenous Every 12 hours 10/10/23 1700 10/11/23 1146   10/10/23 1659  vancomycin variable dose per unstable renal function (pharmacist dosing)  Status:  Discontinued         Does not apply See admin instructions 10/10/23 1700 10/11/23 1146   10/10/23 0930  vancomycin (VANCOCIN) IVPB 1000 mg/200 mL premix       Placed in "And" Linked Group   1,000 mg 200 mL/hr over 60 Minutes Intravenous  Once 10/10/23 0922 10/10/23 1105   10/10/23 0930  vancomycin (VANCOCIN) IVPB 1000 mg/200 mL premix       Placed in "And" Linked Group   1,000 mg 200 mL/hr over 60 Minutes Intravenous  Once 10/10/23 0922 10/10/23 1105   10/10/23 0930  ceFEPIme (MAXIPIME) 2 g in sodium chloride 0.9 % 100 mL IVPB        2 g 200 mL/hr over 30 Minutes Intravenous  Once  10/10/23 0922 10/10/23 1058   10/10/23 0930  metroNIDAZOLE (FLAGYL) IVPB 500 mg        500 mg 100 mL/hr over 60 Minutes Intravenous  Once 10/10/23 1324 10/10/23 1106        Subjective: Patient seen and examined at bedside.  Feels slightly better but still feels very weak.  Still short of breath with exertion. Had bloody sputum this morning. No  fever, vomiting, chest pain reported.  Objective: Vitals:   10/11/23 1957 10/11/23 2321 10/12/23 0251 10/12/23 0645  BP: (!) 110/57 113/74 101/67   Pulse: 87 99 98 87  Resp: 17 20 (!) 24 15  Temp: 98.8 F (37.1 C) 98.9 F (37.2 C)    TempSrc: Oral Oral Oral   SpO2: 92% 92% 94% 92%  Weight:   83 kg 83 kg  Height:        Intake/Output Summary (Last 24 hours) at 10/12/2023 0656 Last data filed at 10/12/2023 4010 Gross per 24 hour  Intake 923.89 ml  Output 1650 ml  Net -726.11 ml   Filed Weights   10/11/23 0500 10/12/23 0251 10/12/23 0645  Weight: 104.3 kg 83 kg 83 kg    Examination:  General exam: Appears calm and comfortable.  On 5 L oxygen via nasal cannula. Respiratory system: Bilateral decreased breath sounds at bases with scattered crackles Cardiovascular system: S1 & S2 heard, Rate controlled Gastrointestinal system: Abdomen is nondistended, soft and nontender. Normal bowel sounds heard. Extremities: No cyanosis, clubbing; trace lower extremity edema Central nervous system: Alert and oriented.  Slow to respond.  Poor historian.  No focal neurological deficits. Moving extremities Skin: No rashes, lesions or ulcers Psychiatry: Flat affect.  Not agitated.    Data Reviewed: I have personally reviewed following labs and imaging studies  CBC: Recent Labs  Lab 10/10/23 1234 10/11/23 0842  WBC 15.4* 16.7*  NEUTROABS 14.3*  --   HGB 13.0 12.4*  HCT 40.7 39.1  MCV 85.9 85.6  PLT 137* 126*   Basic Metabolic Panel: Recent Labs  Lab 10/10/23 0935 10/11/23 0842  NA 142 137  K 4.4 4.1  CL 97* 99  CO2 29 29  GLUCOSE 120* 112*  BUN 35* 28*  CREATININE 1.94* 1.48*  CALCIUM 9.9 9.6   GFR: Estimated Creatinine Clearance: 42.8 mL/min (A) (by C-G formula based on SCr of 1.48 mg/dL (H)). Liver Function Tests: Recent Labs  Lab 10/10/23 0935  AST 37  ALT 14  ALKPHOS 78  BILITOT 1.4*  PROT 7.3  ALBUMIN 3.7   No results for input(s): "LIPASE", "AMYLASE" in the  last 168 hours. No results for input(s): "AMMONIA" in the last 168 hours. Coagulation Profile: No results for input(s): "INR", "PROTIME" in the last 168 hours. Cardiac Enzymes: No results for input(s): "CKTOTAL", "CKMB", "CKMBINDEX", "TROPONINI" in the last 168 hours. BNP (last 3 results) No results for input(s): "PROBNP" in the last 8760 hours. HbA1C: Recent Labs    10/10/23 1648  HGBA1C 5.5   CBG: Recent Labs  Lab 10/10/23 2122 10/11/23 0830 10/11/23 2114  GLUCAP 169* 106* 137*   Lipid Profile: No results for input(s): "CHOL", "HDL", "LDLCALC", "TRIG", "CHOLHDL", "LDLDIRECT" in the last 72 hours. Thyroid Function Tests: No results for input(s): "TSH", "T4TOTAL", "FREET4", "T3FREE", "THYROIDAB" in the last 72 hours. Anemia Panel: No results for input(s): "VITAMINB12", "FOLATE", "FERRITIN", "TIBC", "IRON", "RETICCTPCT" in the last 72 hours. Sepsis Labs: Recent Labs  Lab 10/10/23 0943 10/10/23 1125  LATICACIDVEN 2.9* 2.2*    Recent Results (from  the past 240 hours)  Resp panel by RT-PCR (RSV, Flu A&B, Covid) Anterior Nasal Swab     Status: None   Collection Time: 10/10/23  9:17 AM   Specimen: Anterior Nasal Swab  Result Value Ref Range Status   SARS Coronavirus 2 by RT PCR NEGATIVE NEGATIVE Final   Influenza A by PCR NEGATIVE NEGATIVE Final   Influenza B by PCR NEGATIVE NEGATIVE Final    Comment: (NOTE) The Xpert Xpress SARS-CoV-2/FLU/RSV plus assay is intended as an aid in the diagnosis of influenza from Nasopharyngeal swab specimens and should not be used as a sole basis for treatment. Nasal washings and aspirates are unacceptable for Xpert Xpress SARS-CoV-2/FLU/RSV testing.  Fact Sheet for Patients: BloggerCourse.com  Fact Sheet for Healthcare Providers: SeriousBroker.it  This test is not yet approved or cleared by the [place] FDA and has been authorized for detection and/or diagnosis of SARS-CoV-2  by FDA under an Emergency Use Authorization (EUA). This EUA will remain in effect (meaning this test can be used) for the duration of the COVID-19 declaration under Section 564(b)(1) of the Act, 21 U.S.C. section 360bbb-3(b)(1), unless the authorization is terminated or revoked.     Resp Syncytial Virus by PCR NEGATIVE NEGATIVE Final    Comment: (NOTE) Fact Sheet for Patients: BloggerCourse.com  Fact Sheet for Healthcare Providers: SeriousBroker.it  This test is not yet approved or cleared by the [place] FDA and has been authorized for detection and/or diagnosis of SARS-CoV-2 by FDA under an Emergency Use Authorization (EUA). This EUA will remain in effect (meaning this test can be used) for the duration of the COVID-19 declaration under Section 564(b)(1) of the Act, 21 U.S.C. section 360bbb-3(b)(1), unless the authorization is terminated or revoked.  Performed at [facility], [address] [place], [place] [address]   Culture, blood (routine x 2)     Status: None (Preliminary result)   Collection Time: 10/10/23  9:25 AM   Specimen: BLOOD RIGHT ARM  Result Value Ref Range Status   Specimen Description BLOOD RIGHT ARM  Final   Special Requests   Final    BOTTLES DRAWN AEROBIC AND ANAEROBIC Blood Culture adequate volume   Culture   Final    NO GROWTH < 24 HOURS Performed at [facility], [address], [place], [place] [address]    Report Status PENDING  Incomplete  Culture, blood (routine x 2)     Status: None (Preliminary result)   Collection Time: 10/10/23  9:30 AM   Specimen: BLOOD RIGHT ARM  Result Value Ref Range Status   Specimen Description BLOOD RIGHT ARM  Final   Special Requests   Final    BOTTLES DRAWN AEROBIC AND ANAEROBIC Blood Culture adequate volume   Culture   Final    NO GROWTH < 24 HOURS Performed at [facility], [address] [place], [place] [address]    Report Status  PENDING  Incomplete  Respiratory (~20 pathogens) panel by PCR     Status: None   Collection Time: 10/10/23  1:27 PM   Specimen: Nasopharyngeal Swab; Respiratory  Result Value Ref Range Status   Adenovirus NOT DETECTED NOT DETECTED Final   Coronavirus 229E NOT DETECTED NOT DETECTED Final    Comment: (NOTE) The Coronavirus on the Respiratory Panel, DOES NOT test for the novel  Coronavirus (2019 nCoV)    Coronavirus HKU1 NOT DETECTED NOT DETECTED Final   Coronavirus NL63 NOT DETECTED NOT DETECTED Final   Coronavirus  OC43 NOT DETECTED NOT DETECTED Final   Metapneumovirus NOT DETECTED NOT DETECTED Final   Rhinovirus / Enterovirus NOT DETECTED NOT DETECTED Final   Influenza A NOT DETECTED NOT DETECTED Final   Influenza B NOT DETECTED NOT DETECTED Final   Parainfluenza Virus 1 NOT DETECTED NOT DETECTED Final   Parainfluenza Virus 2 NOT DETECTED NOT DETECTED Final   Parainfluenza Virus 3 NOT DETECTED NOT DETECTED Final   Parainfluenza Virus 4 NOT DETECTED NOT DETECTED Final   Respiratory Syncytial Virus NOT DETECTED NOT DETECTED Final   Bordetella pertussis NOT DETECTED NOT DETECTED Final   Bordetella Parapertussis NOT DETECTED NOT DETECTED Final   Chlamydophila pneumoniae NOT DETECTED NOT DETECTED Final   Mycoplasma pneumoniae NOT DETECTED NOT DETECTED Final    Comment: Performed at [facility], [address] [place], [place] [address]  MRSA Next Gen by PCR, Nasal     Status: None   Collection Time: 10/10/23  2:45 PM   Specimen: Nasal Mucosa; Nasal Swab  Result Value Ref Range Status   MRSA by PCR Next Gen NOT DETECTED NOT DETECTED Final    Comment: (NOTE) The GeneXpert MRSA Assay (FDA approved for NASAL specimens only), is one component of a comprehensive MRSA colonization surveillance program. It is not intended to diagnose MRSA infection nor to guide or monitor treatment for MRSA infections. Test performance is not FDA approved in patients less than 26 years old. Performed  at [facility], [address], [place], [place] [address]   Expectorated Sputum Assessment w Gram Stain, Rflx to Resp Cult     Status: None   Collection Time: 10/10/23 11:25 PM   Specimen: Expectorated Sputum  Result Value Ref Range Status   Specimen Description EXPECTORATED SPUTUM  Final   Special Requests NONE  Final   Sputum evaluation   Final    THIS SPECIMEN IS ACCEPTABLE FOR SPUTUM CULTURE Performed at [facility], [address], [place], [place] [address]    Report Status 10/11/2023 FINAL  Final  Culture, Respiratory w Gram Stain     Status: None (Preliminary result)   Collection Time: 10/10/23 11:25 PM  Result Value Ref Range Status   Specimen Description EXPECTORATED SPUTUM  Final   Special Requests NONE Reflexed from [id number]  Final   Gram Stain   Final    FEW WBC PRESENT, PREDOMINANTLY PMN NO ORGANISMS SEEN Performed at [facility], [address] [place], [place] [address]    Culture PENDING  Incomplete   Report Status PENDING  Incomplete         Radiology Studies: ECHOCARDIOGRAM COMPLETE Result Date: 10/11/2023    ECHOCARDIOGRAM REPORT   Patient Name:   KEILEN KAHL Date of Exam: 10/11/2023 Medical Rec #:  [id number]        Height:       75.0 in Accession #:    [id number]       Weight:       229.9 lb Date of Birth:  1936/06/24         BSA:          2.328 m Patient Age:    87 years         BP:           97/56 mmHg Patient Gender: M                HR:           90 bpm. Exam  Location:  Inpatient Procedure: 2D Echo, Cardiac Doppler and Color Doppler (Both Spectral and Color            Flow Doppler were utilized during procedure). Indications:    CHF- Acute Diastolic  History:        Patient has prior history of Echocardiogram examinations, most                 recent 01/09/2023. COPD; Risk Factors:Sleep Apnea and Former                 Smoker.  Sonographer:    Karma Ganja Referring Phys: GRACE E BOWSER IMPRESSIONS  1. Left ventricular ejection  fraction, by estimation, is 60 to 65%. The left ventricle has normal function. The left ventricle has no regional wall motion abnormalities. Left ventricular diastolic parameters are consistent with Grade I diastolic dysfunction (impaired relaxation).  2. D shaped septum suggesting cor pulmonale. Right ventricular systolic function is severely reduced. The right ventricular size is severely enlarged. There is severely elevated pulmonary artery systolic pressure.  3. Right atrial size was moderately dilated.  4. The mitral valve is normal in structure. No evidence of mitral valve regurgitation. No evidence of mitral stenosis.  5. Tricuspid valve regurgitation is mild to moderate.  6. The aortic valve is tricuspid. There is mild calcification of the aortic valve. There is mild thickening of the aortic valve. Aortic valve regurgitation is trivial. Aortic valve sclerosis is present, with no evidence of aortic valve stenosis.  7. Pulmonic valve regurgitation is moderate.  8. There is borderline dilatation of the aortic root, measuring 37 mm.  9. The inferior vena cava is dilated in size with <50% respiratory variability, suggesting right atrial pressure of 15 mmHg. FINDINGS  Left Ventricle: Left ventricular ejection fraction, by estimation, is 60 to 65%. The left ventricle has normal function. The left ventricle has no regional wall motion abnormalities. Strain imaging was not performed. The left ventricular internal cavity  size was normal in size. There is no left ventricular hypertrophy. Left ventricular diastolic parameters are consistent with Grade I diastolic dysfunction (impaired relaxation). Right Ventricle: D shaped septum suggesting cor pulmonale. The right ventricular size is severely enlarged. No increase in right ventricular wall thickness. Right ventricular systolic function is severely reduced. There is severely elevated pulmonary artery systolic pressure. The tricuspid regurgitant velocity is 3.68 m/s,  and with an assumed right atrial pressure of 15 mmHg, the estimated right ventricular systolic pressure is 69.2 mmHg. Left Atrium: Left atrial size was normal in size. Right Atrium: Right atrial size was moderately dilated. Pericardium: There is no evidence of pericardial effusion. Mitral Valve: The mitral valve is normal in structure. No evidence of mitral valve regurgitation. No evidence of mitral valve stenosis. Tricuspid Valve: The tricuspid valve is normal in structure. Tricuspid valve regurgitation is mild to moderate. No evidence of tricuspid stenosis. Aortic Valve: The aortic valve is tricuspid. There is mild calcification of the aortic valve. There is mild thickening of the aortic valve. Aortic valve regurgitation is trivial. Aortic valve sclerosis is present, with no evidence of aortic valve stenosis. Aortic valve mean gradient measures 3.0 mmHg. Aortic valve peak gradient measures 5.3 mmHg. Aortic valve area, by VTI measures 3.09 cm. Pulmonic Valve: The pulmonic valve was normal in structure. Pulmonic valve regurgitation is moderate. No evidence of pulmonic stenosis. Aorta: The aortic root is normal in size and structure. There is borderline dilatation of the aortic root, measuring 37 mm. Venous: The inferior vena  cava is dilated in size with less than 50% respiratory variability, suggesting right atrial pressure of 15 mmHg. IAS/Shunts: No atrial level shunt detected by color flow Doppler. Additional Comments: 3D imaging was not performed.  LEFT VENTRICLE PLAX 2D LVIDd:         3.80 cm   Diastology LVIDs:         2.40 cm   LV e' medial:    6.31 cm/s LV PW:         1.00 cm   LV E/e' medial:  7.9 LV IVS:        0.90 cm   LV e' lateral:   5.44 cm/s LVOT diam:     2.20 cm   LV E/e' lateral: 9.2 LV SV:         61 LV SV Index:   26 LVOT Area:     3.80 cm  RIGHT VENTRICLE RV Basal diam:  6.20 cm RV S prime:     15.00 cm/s TAPSE (M-mode): 2.2 cm LEFT ATRIUM           Index        RIGHT ATRIUM           Index LA  diam:      3.20 cm 1.37 cm/m   RA Area:     28.40 cm LA Vol (A2C): 59.7 ml 25.64 ml/m  RA Volume:   97.40 ml  41.83 ml/m LA Vol (A4C): 28.8 ml 12.37 ml/m  AORTIC VALVE AV Area (Vmax):    3.34 cm AV Area (Vmean):   3.05 cm AV Area (VTI):     3.09 cm AV Vmax:           115.00 cm/s AV Vmean:          77.000 cm/s AV VTI:            0.197 m AV Peak Grad:      5.3 mmHg AV Mean Grad:      3.0 mmHg LVOT Vmax:         101.00 cm/s LVOT Vmean:        61.800 cm/s LVOT VTI:          0.160 m LVOT/AV VTI ratio: 0.81  AORTA Ao Root diam: 3.70 cm MITRAL VALVE               TRICUSPID VALVE MV Area (PHT): 3.50 cm    TR Peak grad:   54.2 mmHg MV Decel Time: 217 msec    TR Vmax:        368.00 cm/s MV E velocity: 49.80 cm/s MV A velocity: 82.30 cm/s  SHUNTS MV E/A ratio:  0.61        Systemic VTI:  0.16 m                            Systemic Diam: 2.20 cm Charlton Haws MD Electronically signed by Charlton Haws MD Signature Date/Time: 10/11/2023/2:56:58 PM    Final    DG Chest Portable 1 View Result Date: 10/10/2023 CLINICAL DATA:  Shortness of breath EXAM: PORTABLE CHEST 1 VIEW COMPARISON:  Chest radiograph dated 12/02/2022 FINDINGS: Normal lung volumes. Confluent bibasilar opacities, right-greater-than-left. No pleural effusion or pneumothorax. Enlarged cardiomediastinal silhouette is likely projectional. No acute osseous abnormality. IMPRESSION: Confluent bibasilar opacities, right-greater-than-left, which may represent atelectasis, aspiration, or pneumonia. Electronically Signed   By: Agustin Cree M.D.   On: 10/10/2023 10:22  Scheduled Meds:  apixaban  5 mg Oral BID   arformoterol  15 mcg Nebulization BID   budesonide (PULMICORT) nebulizer solution  0.5 mg Nebulization BID   Chlorhexidine Gluconate Cloth  6 each Topical Daily   midodrine  10 mg Oral TID WC   pantoprazole  40 mg Oral Daily   revefenacin  175 mcg Nebulization Daily   Continuous Infusions:  ampicillin-sulbactam (UNASYN) IV 3 g (10/12/23 0622)           Glade Lloyd, MD [facility] 10/12/2023, 6:56 AM

## 2023-10-12 NOTE — Plan of Care (Signed)
  Problem: Health Behavior/Discharge Planning: Goal: Ability to identify and utilize available resources and services will improve Outcome: Progressing   Problem: Fluid Volume: Goal: Ability to maintain a balanced intake and output will improve Outcome: Progressing   Problem: Education: Goal: Ability to describe self-care measures that may prevent or decrease complications (Diabetes Survival Skills Education) will improve Outcome: Progressing

## 2023-10-13 ENCOUNTER — Inpatient Hospital Stay (HOSPITAL_COMMUNITY): Payer: Medicare Other

## 2023-10-13 DIAGNOSIS — J9621 Acute and chronic respiratory failure with hypoxia: Secondary | ICD-10-CM | POA: Diagnosis not present

## 2023-10-13 DIAGNOSIS — J189 Pneumonia, unspecified organism: Secondary | ICD-10-CM | POA: Diagnosis not present

## 2023-10-13 LAB — COMPREHENSIVE METABOLIC PANEL
ALT: 19 U/L (ref 0–44)
AST: 35 U/L (ref 15–41)
Albumin: 2.7 g/dL — ABNORMAL LOW (ref 3.5–5.0)
Alkaline Phosphatase: 62 U/L (ref 38–126)
Anion gap: 9 (ref 5–15)
BUN: 19 mg/dL (ref 8–23)
CO2: 28 mmol/L (ref 22–32)
Calcium: 10 mg/dL (ref 8.9–10.3)
Chloride: 101 mmol/L (ref 98–111)
Creatinine, Ser: 0.98 mg/dL (ref 0.61–1.24)
GFR, Estimated: >60 mL/min (ref >60)
Glucose, Bld: 116 mg/dL — ABNORMAL HIGH (ref 70–99)
Potassium: 3.8 mmol/L (ref 3.5–5.1)
Sodium: 138 mmol/L (ref 135–145)
Total Bilirubin: 1.1 mg/dL (ref 0.0–1.2)
Total Protein: 6.2 g/dL — ABNORMAL LOW (ref 6.5–8.1)

## 2023-10-13 LAB — CULTURE, RESPIRATORY W GRAM STAIN

## 2023-10-13 LAB — CBC WITH DIFFERENTIAL/PLATELET
Abs Immature Granulocytes: 0.03 10*3/uL (ref 0.00–0.07)
Basophils Absolute: 0 10*3/uL (ref 0.0–0.1)
Basophils Relative: 0 %
Eosinophils Absolute: 0.2 10*3/uL (ref 0.0–0.5)
Eosinophils Relative: 2 %
HCT: 40.2 % (ref 39.0–52.0)
Hemoglobin: 12.8 g/dL — ABNORMAL LOW (ref 13.0–17.0)
Immature Granulocytes: 0 %
Lymphocytes Relative: 5 %
Lymphs Abs: 0.5 10*3/uL — ABNORMAL LOW (ref 0.7–4.0)
MCH: 26.9 pg (ref 26.0–34.0)
MCHC: 31.8 g/dL (ref 30.0–36.0)
MCV: 84.5 fL (ref 80.0–100.0)
Monocytes Absolute: 0.9 10*3/uL (ref 0.1–1.0)
Monocytes Relative: 9 %
Neutro Abs: 8.4 10*3/uL — ABNORMAL HIGH (ref 1.7–7.7)
Neutrophils Relative %: 84 %
Platelets: 144 10*3/uL — ABNORMAL LOW (ref 150–400)
RBC: 4.76 MIL/uL (ref 4.22–5.81)
RDW: 16.9 % — ABNORMAL HIGH (ref 11.5–15.5)
WBC: 10.1 10*3/uL (ref 4.0–10.5)
nRBC: 0 % (ref 0.0–0.2)

## 2023-10-13 LAB — GLUCOSE, CAPILLARY: Glucose-Capillary: 105 mg/dL — ABNORMAL HIGH (ref 70–99)

## 2023-10-13 LAB — MAGNESIUM: Magnesium: 1.6 mg/dL — ABNORMAL LOW (ref 1.7–2.4)

## 2023-10-13 MED ORDER — ALBUTEROL SULFATE (2.5 MG/3ML) 0.083% IN NEBU: 2.5000 mg | INHALATION_SOLUTION | RESPIRATORY_TRACT | Status: DC | PRN

## 2023-10-13 MED ORDER — MAGNESIUM SULFATE 2 GM/50ML IV SOLN
2.0000 g | Freq: Once | INTRAVENOUS | Status: AC
Start: 2023-10-13 — End: 2023-10-13
  Administered 2023-10-13: 2 g via INTRAVENOUS
  Filled 2023-10-13: qty 50

## 2023-10-13 NOTE — Evaluation (Addendum)
 Clinical/Bedside Swallow Evaluation Patient Details  Name: Joshua [name] MRN: [id number] Date of Birth: [birth year]  Today's Date: 10/13/2023 Time: SLP Start Time (ACUTE ONLY): 1033 SLP Stop Time (ACUTE ONLY): 1046 SLP Time Calculation (min) (ACUTE ONLY): 13 min  Past Medical History:  Past Medical History:  Diagnosis Date   Arthritis    BPH (benign prostatic hyperplasia)    CHF (congestive heart failure) (HCC)    Chronic hiccups    COPD (chronic obstructive pulmonary disease) (HCC)    Folliculitis    posterior scalp per office visit note of Dr Selena Batten 07/20/2014     GERD (gastroesophageal reflux disease)    Gout    Hypertension    Hypotension    NSTEMI (non-ST elevated myocardial infarction) (HCC) 03/19/2022   PE (pulmonary thromboembolism) (HCC)    Phlebitis    right arm  at least 20 years ago    Pneumonia    hx of pneumonia as a child    Pulmonary embolism (HCC) 03/17/2022   Pulmonary hypertension (HCC)    Severe sepsis (HCC) 03/16/2022   Sleep apnea    Past Surgical History:  Past Surgical History:  Procedure Laterality Date   BIOPSY  11/03/2020   Procedure: BIOPSY;  Surgeon: Jeani Hawking, MD;  Location: [facility];  Service: Endoscopy;;   BIOPSY  07/06/2021   Procedure: BIOPSY;  Surgeon: Jeani Hawking, MD;  Location: [facility];  Service: Endoscopy;;   bone removed from little toe right foot      CHOLECYSTECTOMY     ESOPHAGOGASTRODUODENOSCOPY (EGD) WITH PROPOFOL N/A 11/03/2020   Procedure: ESOPHAGOGASTRODUODENOSCOPY (EGD) WITH PROPOFOL;  Surgeon: Jeani Hawking, MD;  Location: [facility];  Service: Endoscopy;  Laterality: N/A;   ESOPHAGOGASTRODUODENOSCOPY (EGD) WITH PROPOFOL N/A 07/06/2021   Procedure: ESOPHAGOGASTRODUODENOSCOPY (EGD) WITH PROPOFOL;  Surgeon: Jeani Hawking, MD;  Location: [facility];  Service: Endoscopy;  Laterality: N/A;   pilonidal cyst removal      RIGHT HEART CATH N/A 12/13/2020   Procedure: RIGHT HEART CATH;  Surgeon: Laurey Morale, MD;   Location: [facility] INVASIVE CV LAB;  Service: Cardiovascular;  Laterality: N/A;   RIGHT/LEFT HEART CATH AND CORONARY ANGIOGRAPHY N/A 03/21/2022   Procedure: RIGHT/LEFT HEART CATH AND CORONARY ANGIOGRAPHY;  Surgeon: Laurey Morale, MD;  Location: [facility] INVASIVE CV LAB;  Service: Cardiovascular;  Laterality: N/A;   TOTAL KNEE ARTHROPLASTY Left 09/13/2014   Procedure: LEFT TOTAL KNEE ARTHROPLASTY;  Surgeon: Shelda Pal, MD;  Location: [facility];  Service: Orthopedics;  Laterality: Left;   HPI:  88 year old male presented 2/21 with worsening shortness of breath. Dx septic shock, bilateral pna, acute on chronic resp failure. PMHx: chronic respiratory failure with hypoxia, emphysema, pulmonary hypertension, chronic PE, CKD, recurring pneumonia, chronic hypotension, OSA, HF. Hx of GERD and large HH.  [facility] notes mention concern for aspiration related to GERD and recs for swallow study. No hx of MBS or esophagram. He has received recs for [name] elevation during sleep, small portions, avoiding eating two hours before bedtime.    Assessment / Plan / Recommendation  Clinical Impression  [name] was sitting in the recliner s/p PT. Friendly and interactive; reports hx of reflux, frequent hiccoughing. Oral mechanism exam was normal. Voice was wet/congested at baseline. Clinical swallow evaluation revealed no overt s/s of aspiration across all solid and liquid consistencies. However given hx of recurring pna, concerns for aspiration as etiology of pna, and previous recommendations for MBS, recommend pursuing MBS while admitted.   Continue current diet pending study -  he should sit upright while eating and for thirty minutes afterwards; avoid POs(including water) two hours before bedtime; give meds whole in puree; elevate HOB.   If respiratory status declines, make NPO.  SLP will follow.   SLP Visit Diagnosis: Dysphagia, unspecified (R13.10)    Aspiration Risk  Other (comment) (TBA)    Diet Recommendation    Thin;Age appropriate regular  Medication Administration: Whole meds with puree    Other  Recommendations Oral Care Recommendations: Oral care BID    Recommendations for follow up therapy are one component of a multi-disciplinary discharge planning process, led by the attending physician.  Recommendations may be updated based on patient status, additional functional criteria and insurance authorization.  Follow up Recommendations Other (comment) (tba)      Assistance Recommended at Discharge    Functional Status Assessment Patient has had a recent decline in their functional status and demonstrates the ability to make significant improvements in function in a reasonable and predictable amount of time.  Frequency and Duration                    Swallow Study   General HPI: 88 year old male presented 2/21 with worsening shortness of breath. Dx septic shock, bilateral pna, acute on chronic resp failure. PMHx: chronic respiratory failure with hypoxia, emphysema, pulmonary hypertension, chronic PE, CKD, recurring pneumonia, chronic hypotension, OSA, HF. Hx of GERD and large HH.  [facility] notes mention concern for aspiration related to GERD and recs for swallow study. No hx of MBS or esophagram. He has received recs for [facility] elevation during sleep, small portions, avoiding eating two hours before bedtime. Type of Study: Bedside Swallow Evaluation Previous Swallow Assessment: sept 2022 normal swallow Diet Prior to this Study: Regular;Thin liquids (Level 0) Temperature Spikes Noted: No Respiratory Status: Nasal cannula;Other (comment) (HFNC 9L) History of Recent Intubation: No Behavior/Cognition: Alert;Cooperative;Pleasant mood Oral Cavity Assessment: Within Functional Limits Oral Care Completed by SLP: No Vision: Functional for self-feeding Self-Feeding Abilities: Able to feed self Patient Positioning: Upright in chair Baseline Vocal Quality: Wet Volitional Cough: Strong Volitional  Swallow: Able to elicit    Oral/Motor/Sensory Function Overall Oral Motor/Sensory Function: Within functional limits   Ice Chips Ice chips: Within functional limits   Thin Liquid Thin Liquid: Within functional limits    Nectar Thick Nectar Thick Liquid: Not tested   Honey Thick Honey Thick Liquid: Not tested   Puree Puree: Within functional limits   Solid     Solid: Within functional limits      [name] 10/13/2023,11:11 AM  Marchelle Folks L. Samson Frederic, MA CCC/SLP Clinical Specialist - Acute Care SLP Acute Rehabilitation Services Office number [phone]

## 2023-10-13 NOTE — Evaluation (Signed)
 Physical Therapy Evaluation Patient Details Name: Joshua [name] MRN: [id number] DOB: 1936-03-16 Today's Date: 10/13/2023  History of Present Illness  88 year old male who presented with worsening shortness of breath PMH: chronic respiratory failure with hypoxia, emphysema, pulmonary hypertension, chronic PE, CKD, aspiration pneumonia, chronic hypotension, OSA   Clinical Impression  Pt admitted with above. PTA pt was indep without AD, living with spouse in 2 story home in which he had to navigate a flight of stairs to bedroom and bathroom. At this time pt requiring 9LO2 via HFNC, use of RW and minA for transfers and ADLs. Pt to benefit from [facility] services and 24/7 assist however pt concerned regarding his self cath supplies and insurance not covering them. I do not understand this situation but will connect with TOC. Recommend OT consult to address ADLs as pt reports being indep PTA. Acute PT to cont to follow.        If plan is discharge home, recommend the following: A little help with walking and/or transfers;A little help with bathing/dressing/bathroom;Help with stairs or ramp for entrance   Can travel by private vehicle        Equipment Recommendations None recommended by PT (has RW)  Recommendations for Other Services       Functional Status Assessment Patient has had a recent decline in their functional status and demonstrates the ability to make significant improvements in function in a reasonable and predictable amount of time.     Precautions / Restrictions Precautions Precautions: Fall Precaution/Restrictions Comments: watch SpO2, pt on 9Lo2 via [place] Restrictions Weight Bearing Restrictions Per Provider Order: No      Mobility  Bed Mobility Overal bed mobility: Needs Assistance Bed Mobility: Supine to Sit     Supine to sit: Min assist     General bed mobility comments: HOB elevated, increased time, definite use of bed rail    Transfers Overall transfer level:  Needs assistance Equipment used: Rolling walker (2 wheels) Transfers: Sit to/from Stand Sit to Stand: Mod assist           General transfer comment: modA to achieve anterior weight shift and power up    Ambulation/Gait Ambulation/Gait assistance: Min assist Gait Distance (Feet): 12 Feet (from one side of the bed to the other) Assistive device: Rolling walker (2 wheels) Gait Pattern/deviations: Step-to pattern, Decreased stride length, Trunk flexed, Shuffle Gait velocity: decreased Gait velocity interpretation: <1.31 ft/sec, indicative of household ambulator   General Gait Details: minA for walker management, increased time, step to shuffling gait pattern, directional verbal cues during turning  Stairs            Wheelchair Mobility     Tilt Bed    Modified Rankin (Stroke Patients Only)       Balance Overall balance assessment: Needs assistance Sitting-balance support: Feet supported, No upper extremity supported Sitting balance-Leahy Scale: Fair     Standing balance support: Bilateral upper extremity supported, During functional activity, Reliant on assistive device for balance Standing balance-Leahy Scale: Poor Standing balance comment: at this time reliant on RW                             Pertinent Vitals/Pain Pain Assessment Pain Assessment: No/denies pain    Home Living Family/patient expects to be discharged to:: Private residence Living Arrangements: Spouse/significant other;Parent Available Help at Discharge: Family;Available 24 hours/day Type of Home: House Home Access: Stairs to enter Entrance Stairs-Rails: Right Entrance Stairs-Number of Steps:  6 Alternate Level Stairs-Number of Steps: 6+landing+6+landing+4 Home Layout: Two level;Bed/bath upstairs Home Equipment: Rolling Walker (2 wheels);Rollator (4 wheels);Shower seat Additional Comments: supplemental 2LO2 at baselinee    Prior Function Prior Level of Function :  Independent/Modified Independent;Driving             Mobility Comments: denies current use of AD, drives ADLs Comments: indep     Extremity/Trunk Assessment   Upper Extremity Assessment Upper Extremity Assessment: Generalized weakness    Lower Extremity Assessment Lower Extremity Assessment: Generalized weakness    Cervical / Trunk Assessment Cervical / Trunk Assessment: Normal  Communication   Communication Communication: No apparent difficulties    Cognition Arousal: Alert Behavior During Therapy: WFL for tasks assessed/performed   PT - Cognitive impairments: No apparent impairments                       PT - Cognition Comments: per RN pt with potential sundowning. pt currently A&Ox4 however RN from night shift stated some confusion through the night and right when waking up Following commands: Intact       Cueing Cueing Techniques: Verbal cues     General Comments General comments (skin integrity, edema, etc.): SpO2 >92% on 10LO2 via [place] for short distance amb of 12'    Exercises     Assessment/Plan    PT Assessment Patient needs continued PT services  PT Problem List Decreased strength;Decreased activity tolerance;Decreased balance;Decreased mobility       PT Treatment Interventions DME instruction;Gait training;Stair training;Functional mobility training;Therapeutic activities;Therapeutic exercise;Balance training    PT Goals (Current goals can be found in the Care Plan section)  Acute Rehab PT Goals Patient Stated Goal: home PT Goal Formulation: With patient Time For Goal Achievement: 10/27/23 Potential to Achieve Goals: Good    Frequency Min 1X/week     Co-evaluation               AM-PAC PT "6 Clicks" Mobility  Outcome Measure Help needed turning from your back to your side while in a flat bed without using bedrails?: A Little Help needed moving from lying on your back to sitting on the side of a flat bed without using  bedrails?: A Little Help needed moving to and from a bed to a chair (including a wheelchair)?: A Little Help needed standing up from a chair using your arms (e.g., wheelchair or bedside chair)?: A Little Help needed to walk in hospital room?: A Lot (< 20 feet) Help needed climbing 3-5 steps with a railing? : A Lot 6 Click Score: 16    End of Session Equipment Utilized During Treatment: Gait belt;Oxygen Activity Tolerance: Patient tolerated treatment well Patient left: in bed;with call bell/phone within reach;with chair alarm set Nurse Communication: Mobility status PT Visit Diagnosis: Unsteadiness on feet (R26.81)    Time: 8119-1478 PT Time Calculation (min) (ACUTE ONLY): 32 min   Charges:   PT Evaluation $PT Eval Moderate Complexity: 1 Mod PT Treatments $Gait Training: 8-22 mins PT General Charges $$ ACUTE PT VISIT: 1 Visit         Lewis Shock, PT, DPT Acute Rehabilitation Services Secure chat preferred Office #: [phone]   Iona Hansen 10/13/2023, 9:07 AM

## 2023-10-13 NOTE — TOC Initial Note (Signed)
 Transition of Care [facility]) - Initial/Assessment Note    Patient Details  Name: Joshua [name] MRN: [id number] Date of Birth: 10-23-1935  Transition of Care [facility]) CM/SW Contact:    Marliss Coots, LCSW Phone Number: 10/13/2023, 12:16 PM  Clinical Narrative:                  12:16 PM Per chart review, patient is oriented x3 and has SDOH needs (transportation). CSW added transportation resources to patient's AVS.    Barriers to Discharge: Continued Medical Work up   Patient Goals and CMS Choice            Expected Discharge Plan and Services     Post Acute Care Choice: Home Health Living arrangements for the past 2 months: Single Family Home                                      Prior Living Arrangements/Services Living arrangements for the past 2 months: Single Family Home Lives with:: Spouse Patient language and need for interpreter reviewed:: Yes        Need for Family Participation in Patient Care: Yes (Comment) Care giver support system in place?: Yes (comment)   Criminal Activity/Legal Involvement Pertinent to Current Situation/Hospitalization: No - Comment as needed  Activities of Daily Living   ADL Screening (condition at time of admission) Independently performs ADLs?: No Does the patient have a NEW difficulty with bathing/dressing/toileting/self-feeding that is expected to last >3 days?: No Does the patient have a NEW difficulty with getting in/out of bed, walking, or climbing stairs that is expected to last >3 days?: No Does the patient have a NEW difficulty with communication that is expected to last >3 days?: No Is the patient deaf or have difficulty hearing?: No Does the patient have difficulty seeing, even when wearing glasses/contacts?: No Does the patient have difficulty concentrating, remembering, or making decisions?: No  Permission Sought/Granted Permission sought to share information with : Family Supports Permission granted to share  information with : No (Contact information on chart)  Share Information with NAME: Jaydyn Bozzo  Permission granted to share info w AGENCY: [facility]  Permission granted to share info w Relationship: Spouse  Permission granted to share info w Contact Information: [phone]  Emotional Assessment   Attitude/Demeanor/Rapport: Unable to Assess Affect (typically observed): Unable to Assess Orientation: : Oriented to Self, Oriented to Place, Oriented to  Time Alcohol / Substance Use: Not Applicable Psych Involvement: No (comment)  Admission diagnosis:  Acute on chronic respiratory failure with hypoxia (HCC) [J96.21] Pneumonia of both lungs due to infectious organism, unspecified part of lung [J18.9] Patient Active Problem List   Diagnosis Date Noted   Acute on chronic respiratory failure with hypoxia (HCC) 10/10/2023   DNR (do not resuscitate) discussion 10/10/2023   Community acquired bilateral lower lobe pneumonia 10/10/2023   Severe sepsis (HCC) 10/10/2023   Chronic obstructive pulmonary disease with acute lower respiratory infection (HCC) 08/04/2023   CTEPH (chronic thromboembolic pulmonary hypertension) (HCC) 08/04/2023   Type 2 diabetes mellitus with hyperglycemia (HCC) 08/04/2023   Chronic cor pulmonale (HCC) 08/04/2023   Recurrent pneumonia 03/14/2023   Abnormal weight loss 07/29/2022   Alcohol abuse 07/29/2022   Constipation 07/29/2022   Gastro-esophageal reflux disease with esophagitis, without bleeding 07/29/2022   Gastroduodenitis 07/29/2022   Imaging of gastrointestinal tract abnormal 07/29/2022   History of colonic polyps 07/29/2022   COVID-19 virus  infection 05/20/2022   Hypotension 05/20/2022   Stage 3b chronic kidney disease (CKD) (HCC) - baseline SCr 1.5-1.8 05/20/2022   COPD with emphysema (HCC) 03/19/2022   Dyslipidemia    Chronic diastolic CHF (congestive heart failure) (HCC)    Protein-calorie malnutrition, severe (HCC) 12/26/2021   Palliative care by specialist     Abnormal gait 10/05/2021   Gastroesophageal reflux disease without esophagitis 10/05/2021   Hiatal hernia 10/05/2021   Hiccoughs 10/05/2021   Oxygen dependent 10/05/2021   Prediabetes 10/05/2021   Pure hypercholesterolemia 10/05/2021   Gout 10/04/2021   Asymmetrical sensorineural hearing loss 08/29/2021   Nasal dryness 08/29/2021   Ear stuffiness, bilateral 05/09/2021   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   Right heart failure with reduced right ventricular function (HCC) 04/12/2021   OSA (obstructive sleep apnea) 12/07/2020   Aortic atherosclerosis (HCC) 12/07/2020   Pseudophakia, both eyes 06/07/2019   Pulmonary hypertension (HCC) 09/29/2017   Benign prostatic hyperplasia with lower urinary tract symptoms 08/25/2017   Demand ischemia of myocardium (HCC) 08/25/2017   Arthritis 08/24/2017   Hypertension 02/16/2015   Personal history of pulmonary embolism 10/10/2014   Chronic respiratory failure with hypoxia (HCC) - on home O2 @ 2 L/min 10/10/2014   S/P knee replacement 09/13/2014   PCP:  [name], DO Pharmacy:   [phone] [place], [place] - [address] [place] [address] Phone: [phone] Fax: [phone]  [facility] [place], [place] - [address] [place] [address] Phone: [phone] Fax: [phone]     Social Drivers of Health (SDOH) Social History: SDOH Screenings   Food Insecurity: No Food Insecurity (10/10/2023)  Housing: Low Risk  (10/12/2023)  Transportation Needs: Unmet Transportation Needs (10/10/2023)  Utilities: Not At Risk (10/10/2023)  Depression (PHQ2-9): Low Risk  (03/13/2022)  Social Connections: Socially Integrated (10/10/2023)  Stress: No Stress Concern Present (05/09/2021)  Tobacco Use: Medium Risk (10/10/2023)   SDOH Interventions:     Readmission Risk Interventions    10/05/2021    3:15 PM  Readmission Risk Prevention Plan   Transportation Screening Complete  Medication Review (RN Care Manager) Complete  PCP or Specialist appointment within 3-5 days of discharge Complete  HRI or Home Care Consult Complete  SW Recovery Care/Counseling Consult Complete  Palliative Care Screening Not Applicable  Skilled Nursing Facility Not Applicable

## 2023-10-13 NOTE — Plan of Care (Signed)

## 2023-10-13 NOTE — Discharge Instructions (Addendum)
  Information on my medicine - ELIQUIS (apixaban)  This medication education was reviewed with me or my healthcare representative as part of my discharge preparation.  The pharmacist that spoke with me during my hospital stay was:  Ulyses Southward, RPH-CPP  Why was Eliquis prescribed for you? Eliquis was prescribed to treat blood clots that may have been found in the veins of your legs (deep vein thrombosis) or in your lungs (pulmonary embolism) and to reduce the risk of them occurring again.  What do You need to know about Eliquis ? Continue Eliquist 5 mg tablet taken TWICE daily.  Eliquis may be taken with or without food.   Try to take the dose about the same time in the morning and in the evening. If you have difficulty swallowing the tablet whole please discuss with your pharmacist how to take the medication safely.  Take Eliquis exactly as prescribed and DO NOT stop taking Eliquis without talking to the doctor who prescribed the medication.  Stopping may increase your risk of developing a new blood clot.  Refill your prescription before you run out.  After discharge, you should have regular check-up appointments with your healthcare provider that is prescribing your Eliquis.    What do you do if you miss a dose? If a dose of ELIQUIS is not taken at the scheduled time, take it as soon as possible on the same day and twice-daily administration should be resumed. The dose should not be doubled to make up for a missed dose.  Important Safety Information A possible side effect of Eliquis is bleeding. You should call your healthcare provider right away if you experience any of the following: Bleeding from an injury or your nose that does not stop. Unusual colored urine (red or dark brown) or unusual colored stools (red or black). Unusual bruising for unknown reasons. A serious fall or if you hit your head (even if there is no bleeding).  Some medicines may interact with Eliquis and  might increase your risk of bleeding or clotting while on Eliquis. To help avoid this, consult your healthcare provider or pharmacist prior to using any new prescription or non-prescription medications, including herbals, vitamins, non-steroidal anti-inflammatory drugs (NSAIDs) and supplements.  This website has more information on Eliquis (apixaban): http://www.eliquis.com/eliquis/home

## 2023-10-13 NOTE — Plan of Care (Signed)
  Problem: Coping: Goal: Ability to adjust to condition or change in health will improve Outcome: Progressing   Problem: Fluid Volume: Goal: Ability to maintain a balanced intake and output will improve Outcome: Progressing   Problem: Health Behavior/Discharge Planning: Goal: Ability to manage health-related needs will improve Outcome: Progressing   Problem: Nutritional: Goal: Maintenance of adequate nutrition will improve Outcome: Progressing   Problem: Clinical Measurements: Goal: Cardiovascular complication will be avoided Outcome: Progressing   Problem: Pain Managment: Goal: General experience of comfort will improve and/or be controlled Outcome: Progressing

## 2023-10-13 NOTE — Progress Notes (Deleted)
 Patient in no respiratory distress at this time. Patient on room air with Sp02-96%. Bipap on standby

## 2023-10-13 NOTE — Progress Notes (Signed)
 PROGRESS NOTE    Joshua [name]  RUE:[id number] DOB: 1935/08/22 DOA: 10/10/2023 PCP: [name], DO   Brief Narrative:  88 year old male with chronic respiratory failure with hypoxia, emphysema, pulmonary hypertension, chronic PE, CKD, aspiration pneumonia, chronic hypotension, OSA presented with worsening shortness of breath.  On presentation, he was hypoxic and required BiPAP for oxygen saturations of 70s.  He was febrile with leukocytosis and chest x-ray suggestive of possible pneumonia.  He was started on broad-spectrum antibiotics.  Because of persistent hypotension, he was started on pressors and admitted to ICU under PCCM service.  Subsequently, vasopressors have been weaned off with improving blood pressure.  Care has been transferred to [facility] service from 10/12/2023 onwards.  Assessment & Plan:   Septic shock: Present on admission Bilateral pneumonia with possibility of aspiration pneumonia -Presented with fever, leukocytosis with possible pneumonia and was persistently hypotensive requiring pressors and admission to ICU under PCCM service. -Cultures have been negative so far.  Respiratory panel PCR negative.  Influenza/COVID/RSV PCR negative.  Off pressors.  Blood pressure has mostly improved and currently intermittently on the high side -Care has been transferred to [facility] service from 10/12/2023 onwards. -Antibiotics have been switched to IV Unasyn.  SLP evaluation  Acute on chronic hypoxic respiratory failure COPD/emphysema -Required BiPAP on presentation.  Currently on 9 L high flow nasal cannula oxygen.  Wean off as able. -Continue current nebulized regimen -check CXR  Chronic hypotension -Currently on midodrine.  Blood pressure intermittently on the higher side.  Chronic diastolic heart failure and pulmonary hypertension -Echo showed EF of 65 to 70% with grade 1 diastolic dysfunction.  Continue strict input and output.  Daily weights.  Fluid restriction. -Outpatient  follow-up with cardiology  Chronic PE -Continue Eliquis  AKI on CKD stage IIIa -Creatinine much improved to 0.98 today.  Monitor   leukocytosis -Resolved  Hypomagnesemia -Replace.  Repeat a.m. labs  Lactic acidosis -Improving  Thrombocytopenia -Improving.  Monitor intermittently.  Physical deconditioning -PT eval    DVT prophylaxis: Eliquis Code Status: DNR Family Communication: None at bedside Disposition Plan: Status is: Inpatient Remains inpatient appropriate because: Of severity of illness   Consultants: PCCM  Procedures: 2D echo  Antimicrobials:  Anti-infectives (From admission, onward)    Start     Dose/Rate Route Frequency Ordered Stop   10/11/23 1245  Ampicillin-Sulbactam (UNASYN) 3 g in sodium chloride 0.9 % 100 mL IVPB        3 g 200 mL/hr over 30 Minutes Intravenous Every 6 hours 10/11/23 1146     10/10/23 2200  ceFEPIme (MAXIPIME) 2 g in sodium chloride 0.9 % 100 mL IVPB  Status:  Discontinued        2 g 200 mL/hr over 30 Minutes Intravenous Every 12 hours 10/10/23 1700 10/11/23 1146   10/10/23 1659  vancomycin variable dose per unstable renal function (pharmacist dosing)  Status:  Discontinued         Does not apply See admin instructions 10/10/23 1700 10/11/23 1146   10/10/23 0930  vancomycin (VANCOCIN) IVPB 1000 mg/200 mL premix       Placed in "And" Linked Group   1,000 mg 200 mL/hr over 60 Minutes Intravenous  Once 10/10/23 0922 10/10/23 1105   10/10/23 0930  vancomycin (VANCOCIN) IVPB 1000 mg/200 mL premix       Placed in "And" Linked Group   1,000 mg 200 mL/hr over 60 Minutes Intravenous  Once 10/10/23 0922 10/10/23 1105   10/10/23 0930  ceFEPIme (MAXIPIME) 2 g in  sodium chloride 0.9 % 100 mL IVPB        2 g 200 mL/hr over 30 Minutes Intravenous  Once 10/10/23 0922 10/10/23 1058   10/10/23 0930  metroNIDAZOLE (FLAGYL) IVPB 500 mg        500 mg 100 mL/hr over 60 Minutes Intravenous  Once 10/10/23 5732 10/10/23 1106         Subjective: Patient seen and examined at bedside.  Still short of breath with minimal exertion.  Complains of intermittent cough.  No fever, chest pain, vomiting reported. Objective: Vitals:   10/12/23 1930 10/12/23 1937 10/12/23 2253 10/13/23 0253  BP: (!) 141/82  (!) 147/92 (!) 152/87  Pulse: (!) 112  (!) 117 (!) 114  Resp: 14  20 18   Temp: 97.7 F (36.5 C)  97.8 F (36.6 C) 98.2 F (36.8 C)  TempSrc: Axillary  Axillary Oral  SpO2: 93% 91% 90% 92%  Weight:    83.1 kg  Height:        Intake/Output Summary (Last 24 hours) at 10/13/2023 0647 Last data filed at 10/13/2023 0457 Gross per 24 hour  Intake 640 ml  Output 1300 ml  Net -660 ml   Filed Weights   10/12/23 0251 10/12/23 0645 10/13/23 0253  Weight: 83 kg 83 kg 83.1 kg    Examination:  General: Currently on 9 L oxygen via nasal cannula.  No acute distress. ENT/neck: No thyromegaly.  JVD is not elevated  respiratory: Decreased breath sounds at bases bilaterally with some crackles; no wheezing  CVS: S1-S2 heard, tachycardic intermittently  abdominal: Soft, nontender, slightly distended; no organomegaly, normal bowel sounds are heard Extremities: Trace lower extremity edema; no cyanosis  CNS: Awake and alert.  Slow to respond.  No focal neurologic deficit.  Moves extremities Lymph: No obvious lymphadenopathy Skin: No obvious ecchymosis/lesions  psych: Mostly flat affect.  Currently not agitated.   Musculoskeletal: No obvious joint swelling/deformity     Data Reviewed: I have personally reviewed following labs and imaging studies  CBC: Recent Labs  Lab 10/10/23 1234 10/11/23 0842 10/13/23 0226  WBC 15.4* 16.7* 10.1  NEUTROABS 14.3*  --  8.4*  HGB 13.0 12.4* 12.8*  HCT 40.7 39.1 40.2  MCV 85.9 85.6 84.5  PLT 137* 126* 144*   Basic Metabolic Panel: Recent Labs  Lab 10/10/23 0935 10/11/23 0842 10/13/23 0226  NA 142 137 138  K 4.4 4.1 3.8  CL 97* 99 101  CO2 29 29 28   GLUCOSE 120* 112* 116*   BUN 35* 28* 19  CREATININE 1.94* 1.48* 0.98  CALCIUM 9.9 9.6 10.0  MG  --   --  1.6*   GFR: Estimated Creatinine Clearance: 64.8 mL/min (by C-G formula based on SCr of 0.98 mg/dL). Liver Function Tests: Recent Labs  Lab 10/10/23 0935 10/13/23 0226  AST 37 35  ALT 14 19  ALKPHOS 78 62  BILITOT 1.4* 1.1  PROT 7.3 6.2*  ALBUMIN 3.7 2.7*   No results for input(s): "LIPASE", "AMYLASE" in the last 168 hours. No results for input(s): "AMMONIA" in the last 168 hours. Coagulation Profile: No results for input(s): "INR", "PROTIME" in the last 168 hours. Cardiac Enzymes: No results for input(s): "CKTOTAL", "CKMB", "CKMBINDEX", "TROPONINI" in the last 168 hours. BNP (last 3 results) No results for input(s): "PROBNP" in the last 8760 hours. HbA1C: Recent Labs    10/10/23 1648  HGBA1C 5.5   CBG: Recent Labs  Lab 10/10/23 2122 10/11/23 0830 10/11/23 2114  GLUCAP 169* 106* 137*  Lipid Profile: No results for input(s): "CHOL", "HDL", "LDLCALC", "TRIG", "CHOLHDL", "LDLDIRECT" in the last 72 hours. Thyroid Function Tests: No results for input(s): "TSH", "T4TOTAL", "FREET4", "T3FREE", "THYROIDAB" in the last 72 hours. Anemia Panel: No results for input(s): "VITAMINB12", "FOLATE", "FERRITIN", "TIBC", "IRON", "RETICCTPCT" in the last 72 hours. Sepsis Labs: Recent Labs  Lab 10/10/23 8119 10/10/23 1125  LATICACIDVEN 2.9* 2.2*    Recent Results (from the past 240 hours)  Resp panel by RT-PCR (RSV, Flu A&B, Covid) Anterior Nasal Swab     Status: None   Collection Time: 10/10/23  9:17 AM   Specimen: Anterior Nasal Swab  Result Value Ref Range Status   SARS Coronavirus 2 by RT PCR NEGATIVE NEGATIVE Final   Influenza A by PCR NEGATIVE NEGATIVE Final   Influenza B by PCR NEGATIVE NEGATIVE Final    Comment: (NOTE) The Xpert Xpress SARS-CoV-2/FLU/RSV plus assay is intended as an aid in the diagnosis of influenza from Nasopharyngeal swab specimens and should not be used as a sole  basis for treatment. Nasal washings and aspirates are unacceptable for Xpert Xpress SARS-CoV-2/FLU/RSV testing.  Fact Sheet for Patients: BloggerCourse.com  Fact Sheet for Healthcare Providers: SeriousBroker.it  This test is not yet approved or cleared by the [place] FDA and has been authorized for detection and/or diagnosis of SARS-CoV-2 by FDA under an Emergency Use Authorization (EUA). This EUA will remain in effect (meaning this test can be used) for the duration of the COVID-19 declaration under Section 564(b)(1) of the Act, 21 U.S.C. section 360bbb-3(b)(1), unless the authorization is terminated or revoked.     Resp Syncytial Virus by PCR NEGATIVE NEGATIVE Final    Comment: (NOTE) Fact Sheet for Patients: BloggerCourse.com  Fact Sheet for Healthcare Providers: SeriousBroker.it  This test is not yet approved or cleared by the [place] FDA and has been authorized for detection and/or diagnosis of SARS-CoV-2 by FDA under an Emergency Use Authorization (EUA). This EUA will remain in effect (meaning this test can be used) for the duration of the COVID-19 declaration under Section 564(b)(1) of the Act, 21 U.S.C. section 360bbb-3(b)(1), unless the authorization is terminated or revoked.  Performed at [facility], [address], [place], [place] [address]   Culture, blood (routine x 2)     Status: None (Preliminary result)   Collection Time: 10/10/23  9:25 AM   Specimen: BLOOD RIGHT ARM  Result Value Ref Range Status   Specimen Description BLOOD RIGHT ARM  Final   Special Requests   Final    BOTTLES DRAWN AEROBIC AND ANAEROBIC Blood Culture adequate volume   Culture   Final    NO GROWTH 3 DAYS Performed at [facility], [address], [place], [place] [address]    Report Status PENDING  Incomplete  Culture, blood (routine x 2)     Status: None  (Preliminary result)   Collection Time: 10/10/23  9:30 AM   Specimen: BLOOD RIGHT ARM  Result Value Ref Range Status   Specimen Description BLOOD RIGHT ARM  Final   Special Requests   Final    BOTTLES DRAWN AEROBIC AND ANAEROBIC Blood Culture adequate volume   Culture   Final    NO GROWTH 3 DAYS Performed at [facility], [address], [place], [place] [address]    Report Status PENDING  Incomplete  Respiratory (~20 pathogens) panel by PCR     Status: None   Collection Time: 10/10/23  1:27 PM   Specimen:  Nasopharyngeal Swab; Respiratory  Result Value Ref Range Status   Adenovirus NOT DETECTED NOT DETECTED Final   Coronavirus 229E NOT DETECTED NOT DETECTED Final    Comment: (NOTE) The Coronavirus on the Respiratory Panel, DOES NOT test for the novel  Coronavirus (2019 nCoV)    Coronavirus HKU1 NOT DETECTED NOT DETECTED Final   Coronavirus NL63 NOT DETECTED NOT DETECTED Final   Coronavirus OC43 NOT DETECTED NOT DETECTED Final   Metapneumovirus NOT DETECTED NOT DETECTED Final   Rhinovirus / Enterovirus NOT DETECTED NOT DETECTED Final   Influenza A NOT DETECTED NOT DETECTED Final   Influenza B NOT DETECTED NOT DETECTED Final   Parainfluenza Virus 1 NOT DETECTED NOT DETECTED Final   Parainfluenza Virus 2 NOT DETECTED NOT DETECTED Final   Parainfluenza Virus 3 NOT DETECTED NOT DETECTED Final   Parainfluenza Virus 4 NOT DETECTED NOT DETECTED Final   Respiratory Syncytial Virus NOT DETECTED NOT DETECTED Final   Bordetella pertussis NOT DETECTED NOT DETECTED Final   Bordetella Parapertussis NOT DETECTED NOT DETECTED Final   Chlamydophila pneumoniae NOT DETECTED NOT DETECTED Final   Mycoplasma pneumoniae NOT DETECTED NOT DETECTED Final    Comment: Performed at [facility], [address], [place], [place] [address]  MRSA Next Gen by PCR, Nasal     Status: None   Collection Time: 10/10/23  2:45 PM   Specimen: Nasal Mucosa; Nasal Swab  Result Value Ref Range Status    MRSA by PCR Next Gen NOT DETECTED NOT DETECTED Final    Comment: (NOTE) The GeneXpert MRSA Assay (FDA approved for NASAL specimens only), is one component of a comprehensive MRSA colonization surveillance program. It is not intended to diagnose MRSA infection nor to guide or monitor treatment for MRSA infections. Test performance is not FDA approved in patients less than 64 years old. Performed at [facility], [address] [place], [place] [address]   Expectorated Sputum Assessment w Gram Stain, Rflx to Resp Cult     Status: None   Collection Time: 10/10/23 11:25 PM   Specimen: Expectorated Sputum  Result Value Ref Range Status   Specimen Description EXPECTORATED SPUTUM  Final   Special Requests NONE  Final   Sputum evaluation   Final    THIS SPECIMEN IS ACCEPTABLE FOR SPUTUM CULTURE Performed at [facility], [address] [place], [place] [address]    Report Status 10/11/2023 FINAL  Final  Culture, Respiratory w Gram Stain     Status: None (Preliminary result)   Collection Time: 10/10/23 11:25 PM  Result Value Ref Range Status   Specimen Description EXPECTORATED SPUTUM  Final   Special Requests NONE Reflexed from [id number]  Final   Gram Stain   Final    FEW WBC PRESENT, PREDOMINANTLY PMN NO ORGANISMS SEEN    Culture   Final    CULTURE REINCUBATED FOR BETTER GROWTH Performed at [facility], [address], [place], [place] [address]    Report Status PENDING  Incomplete         Radiology Studies: ECHOCARDIOGRAM COMPLETE Result Date: 10/11/2023    ECHOCARDIOGRAM REPORT   Patient Name:   CANE DUBRAY Date of Exam: 10/11/2023 Medical Rec #:  [id number]        Height:       75.0 in Accession #:    [id number]       Weight:       229.9 lb Date of Birth:  01-07-1936  BSA:          2.328 m Patient Age:    87 years         BP:           97/56 mmHg Patient Gender: M                HR:           90 bpm. Exam Location:  Inpatient Procedure: 2D Echo,  Cardiac Doppler and Color Doppler (Both Spectral and Color            Flow Doppler were utilized during procedure). Indications:    CHF- Acute Diastolic  History:        Patient has prior history of Echocardiogram examinations, most                 recent 01/09/2023. COPD; Risk Factors:Sleep Apnea and Former                 Smoker.  Sonographer:    Karma Ganja Referring Phys: GRACE E BOWSER IMPRESSIONS  1. Left ventricular ejection fraction, by estimation, is 60 to 65%. The left ventricle has normal function. The left ventricle has no regional wall motion abnormalities. Left ventricular diastolic parameters are consistent with Grade I diastolic dysfunction (impaired relaxation).  2. D shaped septum suggesting cor pulmonale. Right ventricular systolic function is severely reduced. The right ventricular size is severely enlarged. There is severely elevated pulmonary artery systolic pressure.  3. Right atrial size was moderately dilated.  4. The mitral valve is normal in structure. No evidence of mitral valve regurgitation. No evidence of mitral stenosis.  5. Tricuspid valve regurgitation is mild to moderate.  6. The aortic valve is tricuspid. There is mild calcification of the aortic valve. There is mild thickening of the aortic valve. Aortic valve regurgitation is trivial. Aortic valve sclerosis is present, with no evidence of aortic valve stenosis.  7. Pulmonic valve regurgitation is moderate.  8. There is borderline dilatation of the aortic root, measuring 37 mm.  9. The inferior vena cava is dilated in size with <50% respiratory variability, suggesting right atrial pressure of 15 mmHg. FINDINGS  Left Ventricle: Left ventricular ejection fraction, by estimation, is 60 to 65%. The left ventricle has normal function. The left ventricle has no regional wall motion abnormalities. Strain imaging was not performed. The left ventricular internal cavity  size was normal in size. There is no left ventricular hypertrophy.  Left ventricular diastolic parameters are consistent with Grade I diastolic dysfunction (impaired relaxation). Right Ventricle: D shaped septum suggesting cor pulmonale. The right ventricular size is severely enlarged. No increase in right ventricular wall thickness. Right ventricular systolic function is severely reduced. There is severely elevated pulmonary artery systolic pressure. The tricuspid regurgitant velocity is 3.68 m/s, and with an assumed right atrial pressure of 15 mmHg, the estimated right ventricular systolic pressure is 69.2 mmHg. Left Atrium: Left atrial size was normal in size. Right Atrium: Right atrial size was moderately dilated. Pericardium: There is no evidence of pericardial effusion. Mitral Valve: The mitral valve is normal in structure. No evidence of mitral valve regurgitation. No evidence of mitral valve stenosis. Tricuspid Valve: The tricuspid valve is normal in structure. Tricuspid valve regurgitation is mild to moderate. No evidence of tricuspid stenosis. Aortic Valve: The aortic valve is tricuspid. There is mild calcification of the aortic valve. There is mild thickening of the aortic valve. Aortic valve regurgitation is trivial. Aortic valve sclerosis is present, with  no evidence of aortic valve stenosis. Aortic valve mean gradient measures 3.0 mmHg. Aortic valve peak gradient measures 5.3 mmHg. Aortic valve area, by VTI measures 3.09 cm. Pulmonic Valve: The pulmonic valve was normal in structure. Pulmonic valve regurgitation is moderate. No evidence of pulmonic stenosis. Aorta: The aortic root is normal in size and structure. There is borderline dilatation of the aortic root, measuring 37 mm. Venous: The inferior vena cava is dilated in size with less than 50% respiratory variability, suggesting right atrial pressure of 15 mmHg. IAS/Shunts: No atrial level shunt detected by color flow Doppler. Additional Comments: 3D imaging was not performed.  LEFT VENTRICLE PLAX 2D LVIDd:          3.80 cm   Diastology LVIDs:         2.40 cm   LV e' medial:    6.31 cm/s LV PW:         1.00 cm   LV E/e' medial:  7.9 LV IVS:        0.90 cm   LV e' lateral:   5.44 cm/s LVOT diam:     2.20 cm   LV E/e' lateral: 9.2 LV SV:         61 LV SV Index:   26 LVOT Area:     3.80 cm  RIGHT VENTRICLE RV Basal diam:  6.20 cm RV S prime:     15.00 cm/s TAPSE (M-mode): 2.2 cm LEFT ATRIUM           Index        RIGHT ATRIUM           Index LA diam:      3.20 cm 1.37 cm/m   RA Area:     28.40 cm LA Vol (A2C): 59.7 ml 25.64 ml/m  RA Volume:   97.40 ml  41.83 ml/m LA Vol (A4C): 28.8 ml 12.37 ml/m  AORTIC VALVE AV Area (Vmax):    3.34 cm AV Area (Vmean):   3.05 cm AV Area (VTI):     3.09 cm AV Vmax:           115.00 cm/s AV Vmean:          77.000 cm/s AV VTI:            0.197 m AV Peak Grad:      5.3 mmHg AV Mean Grad:      3.0 mmHg LVOT Vmax:         101.00 cm/s LVOT Vmean:        61.800 cm/s LVOT VTI:          0.160 m LVOT/AV VTI ratio: 0.81  AORTA Ao Root diam: 3.70 cm MITRAL VALVE               TRICUSPID VALVE MV Area (PHT): 3.50 cm    TR Peak grad:   54.2 mmHg MV Decel Time: 217 msec    TR Vmax:        368.00 cm/s MV E velocity: 49.80 cm/s MV A velocity: 82.30 cm/s  SHUNTS MV E/A ratio:  0.61        Systemic VTI:  0.16 m                            Systemic Diam: 2.20 cm Charlton Haws MD Electronically signed by Charlton Haws MD Signature Date/Time: 10/11/2023/2:56:58 PM    Final         Scheduled  Meds:  apixaban  5 mg Oral BID   arformoterol  15 mcg Nebulization BID   budesonide (PULMICORT) nebulizer solution  0.5 mg Nebulization BID   Chlorhexidine Gluconate Cloth  6 each Topical Daily   midodrine  10 mg Oral TID WC   pantoprazole  40 mg Oral Daily   revefenacin  175 mcg Nebulization Daily   Continuous Infusions:  ampicillin-sulbactam (UNASYN) IV 3 g (10/13/23 0604)          Glade Lloyd, MD [facility] 10/13/2023, 6:47 AM

## 2023-10-13 NOTE — TOC Initial Note (Signed)
 Transition of Care [facility]) - Initial/Assessment Note    Patient Details  Name: Joshua [name] MRN: [id number] Date of Birth: 1936/01/30  Transition of Care [facility]) CM/SW Contact:    Harriet Masson, RN Phone Number: 10/13/2023, 3:34 PM  Clinical Narrative:                 Spoke to patient and wife at bedside regarding transition needs.  Wife wants patient to go to SNF post discharge. Patient is agreeable. This RNCM sent message to PT, OT regarding recs. Patient doesn't want to use [facility] due to the agency didn't pay for his straight catheter supplies last time. Patient has home 02 from [employer]. If patient does go home with home health the home health agency will need to make for straight catheter supplies.  Address, Phone number and PCP verified. TOC will continue to follow for needs.  Expected Discharge Plan: Skilled Nursing Facility Barriers to Discharge: Continued Medical Work up   Patient Goals and CMS Choice Patient states their goals for this hospitalization and ongoing recovery are:: patient agreeable to to SNF          Expected Discharge Plan and Services     Post Acute Care Choice: Skilled Nursing Facility Living arrangements for the past 2 months: Single Family Home                                      Prior Living Arrangements/Services Living arrangements for the past 2 months: Single Family Home Lives with:: Spouse Patient language and need for interpreter reviewed:: Yes        Need for Family Participation in Patient Care: Yes (Comment) Care giver support system in place?: Yes (comment)   Criminal Activity/Legal Involvement Pertinent to Current Situation/Hospitalization: No - Comment as needed  Activities of Daily Living   ADL Screening (condition at time of admission) Independently performs ADLs?: No Does the patient have a NEW difficulty with bathing/dressing/toileting/self-feeding that is expected to last >3 days?: No Does the patient  have a NEW difficulty with getting in/out of bed, walking, or climbing stairs that is expected to last >3 days?: No Does the patient have a NEW difficulty with communication that is expected to last >3 days?: No Is the patient deaf or have difficulty hearing?: No Does the patient have difficulty seeing, even when wearing glasses/contacts?: No Does the patient have difficulty concentrating, remembering, or making decisions?: No  Permission Sought/Granted Permission sought to share information with : Family Supports Permission granted to share information with : No (Contact information on chart)  Share Information with NAME: Joshua [name]  Permission granted to share info w AGENCY: [facility]  Permission granted to share info w Relationship: Spouse  Permission granted to share info w Contact Information: [phone]  Emotional Assessment   Attitude/Demeanor/Rapport: Unable to Assess Affect (typically observed): Unable to Assess Orientation: : Oriented to Self, Oriented to Place, Oriented to  Time Alcohol / Substance Use: Not Applicable Psych Involvement: No (comment)  Admission diagnosis:  Acute on chronic respiratory failure with hypoxia (HCC) [J96.21] Pneumonia of both lungs due to infectious organism, unspecified part of lung [J18.9] Patient Active Problem List   Diagnosis Date Noted   Acute on chronic respiratory failure with hypoxia (HCC) 10/10/2023   DNR (do not resuscitate) discussion 10/10/2023   Community acquired bilateral lower lobe pneumonia 10/10/2023   Severe sepsis (HCC) 10/10/2023   Chronic obstructive  pulmonary disease with acute lower respiratory infection (HCC) 08/04/2023   CTEPH (chronic thromboembolic pulmonary hypertension) (HCC) 08/04/2023   Type 2 diabetes mellitus with hyperglycemia (HCC) 08/04/2023   Chronic cor pulmonale (HCC) 08/04/2023   Recurrent pneumonia 03/14/2023   Abnormal weight loss 07/29/2022   Alcohol abuse 07/29/2022   Constipation 07/29/2022    Gastro-esophageal reflux disease with esophagitis, without bleeding 07/29/2022   Gastroduodenitis 07/29/2022   Imaging of gastrointestinal tract abnormal 07/29/2022   History of colonic polyps 07/29/2022   COVID-19 virus infection 05/20/2022   Hypotension 05/20/2022   Stage 3b chronic kidney disease (CKD) (HCC) - baseline SCr 1.5-1.8 05/20/2022   COPD with emphysema (HCC) 03/19/2022   Dyslipidemia    Chronic diastolic CHF (congestive heart failure) (HCC)    Protein-calorie malnutrition, severe (HCC) 12/26/2021   Palliative care by specialist    Abnormal gait 10/05/2021   Gastroesophageal reflux disease without esophagitis 10/05/2021   Hiatal hernia 10/05/2021   Hiccoughs 10/05/2021   Oxygen dependent 10/05/2021   Prediabetes 10/05/2021   Pure hypercholesterolemia 10/05/2021   Gout 10/04/2021   Asymmetrical sensorineural hearing loss 08/29/2021   Nasal dryness 08/29/2021   Ear stuffiness, bilateral 05/09/2021   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   Right heart failure with reduced right ventricular function (HCC) 04/12/2021   OSA (obstructive sleep apnea) 12/07/2020   Aortic atherosclerosis (HCC) 12/07/2020   Pseudophakia, both eyes 06/07/2019   Pulmonary hypertension (HCC) 09/29/2017   Benign prostatic hyperplasia with lower urinary tract symptoms 08/25/2017   Demand ischemia of myocardium (HCC) 08/25/2017   Arthritis 08/24/2017   Hypertension 02/16/2015   Personal history of pulmonary embolism 10/10/2014   Chronic respiratory failure with hypoxia (HCC) - on home O2 @ 2 L/min 10/10/2014   S/P knee replacement 09/13/2014   PCP:  [name], DO Pharmacy:   [facility] [phone] [place], [place] - [address] [place] [address] Phone: [phone] Fax: [phone]  [facility] - [place], [place] - [address] [place] [address] Phone: [phone] Fax: [phone]     Social Drivers of  Health (SDOH) Social History: SDOH Screenings   Food Insecurity: No Food Insecurity (10/10/2023)  Housing: Low Risk  (10/12/2023)  Transportation Needs: Unmet Transportation Needs (10/10/2023)  Utilities: Not At Risk (10/10/2023)  Depression (PHQ2-9): Low Risk  (03/13/2022)  Social Connections: Socially Integrated (10/10/2023)  Stress: No Stress Concern Present (05/09/2021)  Tobacco Use: Medium Risk (10/10/2023)   SDOH Interventions:     Readmission Risk Interventions    10/05/2021    3:15 PM  Readmission Risk Prevention Plan  Transportation Screening Complete  Medication Review (RN Care Manager) Complete  PCP or Specialist appointment within 3-5 days of discharge Complete  HRI or Home Care Consult Complete  SW Recovery Care/Counseling Consult Complete  Palliative Care Screening Not Applicable  Skilled Nursing Facility Not Applicable

## 2023-10-13 NOTE — Care Management Important Message (Signed)
 Important Message  Patient Details  Name: Joshua [name] MRN: [id number] Date of Birth: [birth year]   Important Message Given:  Yes - Medicare IM     Dorena Bodo 10/13/2023, 3:07 PM

## 2023-10-13 NOTE — Evaluation (Signed)
 Occupational Therapy Evaluation Patient Details Name: Joshua [name] MRN: [id number] DOB: 1935-08-26 Today's Date: 10/13/2023   History of Present Illness   88 year old male who presented with worsening shortness of breath PMH: chronic respiratory failure with hypoxia, emphysema, pulmonary hypertension, chronic PE, CKD, aspiration pneumonia, chronic hypotension, OSA     Clinical Impressions Prior to this admission, patient living with his spouse, and fully independent without AD, and driving. Currently, patient on 11L high flow oxygen, and with generalized weakness. Patient min A for transfers from recliner, and need for min-mod A for ADL management. Patient limited in functional activity tolerance due to need for increased oxygen as patient's O2 dropped to 83% on 11L, requiring 15L (RN notified and then present). RN presenting Venturi mask at end of OT session as patient frequently breathing through his mouth. Patient would benefit from [facility], however patient is nervous about receiving services as patient self-caths and last time patient's catheter supplies were messed up and he was unable to use the ones provided through the home health company. PT messaging TOC to relay message. OT will continue to follow acutely.      If plan is discharge home, recommend the following:   A little help with walking and/or transfers;A lot of help with bathing/dressing/bathroom;Assist for transportation;Help with stairs or ramp for entrance (initially)     Functional Status Assessment   Patient has had a recent decline in their functional status and demonstrates the ability to make significant improvements in function in a reasonable and predictable amount of time.     Equipment Recommendations   None recommended by OT     Recommendations for Other Services         Precautions/Restrictions   Precautions Precautions: Fall Precaution/Restrictions Comments: watch SpO2, pt on 11Lo2 via  [place] Restrictions Weight Bearing Restrictions Per Provider Order: No     Mobility Bed Mobility Overal bed mobility: Needs Assistance             General bed mobility comments: up in recliner upon arrival    Transfers Overall transfer level: Needs assistance Equipment used: Rolling walker (2 wheels) Transfers: Sit to/from Stand Sit to Stand: Min assist           General transfer comment: min A from recliner as patient could push up from arm rests      Balance Overall balance assessment: Needs assistance Sitting-balance support: Feet supported, No upper extremity supported Sitting balance-Leahy Scale: Fair     Standing balance support: Bilateral upper extremity supported, During functional activity, Reliant on assistive device for balance Standing balance-Leahy Scale: Poor Standing balance comment: at this time reliant on RW                           ADL either performed or assessed with clinical judgement   ADL Overall ADL's : Needs assistance/impaired Eating/Feeding: Set up;Sitting   Grooming: Set up;Sitting   Upper Body Bathing: Set up;Sitting   Lower Body Bathing: Minimal assistance;Moderate assistance;Sit to/from stand;Sitting/lateral leans   Upper Body Dressing : Set up;Sitting   Lower Body Dressing: Minimal assistance;Moderate assistance;Sit to/from stand;Sitting/lateral leans   Toilet Transfer: Minimal assistance;Ambulation;Rolling walker (2 wheels)   Toileting- Clothing Manipulation and Hygiene: Minimal assistance;Sit to/from stand;Sitting/lateral lean       Functional mobility during ADLs: Minimal assistance;Cueing for safety;Cueing for sequencing;Rolling walker (2 wheels) General ADL Comments: Prior to this admission, patient living with his spouse, and fully independent without AD, and driving.  Currently, patient on 11L high flow oxygen, and with generalized weakness. Patient min A for transfers from recliner, and need for min-mod A  for ADL management. Patient limited in functional activity tolerance due to need for increased oxygen as patient's O2 dropped to 83% on 11L, requiring 15L (RN notified and then present). RN presenting Venturi mask at end of OT session as patient frequently breathing through his mouth. Patient would benefit from [facility], however patient is nervous about receiving services as patient self-caths and last time patient's catheter supplies were messed up and he was unable to use the ones provided through the home health company. PT messaging TOC to relay message. OT will continue to follow acutely.     Vision Baseline Vision/History: 1 Wears glasses Ability to See in Adequate Light: 0 Adequate Patient Visual Report: Blurring of vision Vision Assessment?: No apparent visual deficits Additional Comments: Patient reporting blurred vision over the last 3 months, needs to find a new eye doctor as his retired     Clinical biochemist: Not tested       Praxis Praxis: Not tested       Pertinent Vitals/Pain Pain Assessment Pain Assessment: No/denies pain     Extremity/Trunk Assessment Upper Extremity Assessment Upper Extremity Assessment: Generalized weakness;Right hand dominant   Lower Extremity Assessment Lower Extremity Assessment: Defer to PT evaluation;Generalized weakness   Cervical / Trunk Assessment Cervical / Trunk Assessment: Normal   Communication Communication Communication: No apparent difficulties   Cognition Arousal: Alert Behavior During Therapy: WFL for tasks assessed/performed Cognition: No apparent impairments             OT - Cognition Comments: Minimal increased time responding to questions, however more likely due to oxygen demands                 Following commands: Intact       Cueing  General Comments   Cueing Techniques: Verbal cues      Exercises     Shoulder Instructions      Home Living Family/patient expects to be discharged to::  Private residence Living Arrangements: Spouse/significant other;Parent Available Help at Discharge: Family;Available 24 hours/day Type of Home: House Home Access: Stairs to enter [employer] of Steps: 6 Entrance Stairs-Rails: Right Home Layout: Two level;Bed/bath upstairs Alternate Level Stairs-Number of Steps: 6+landing+6+landing+4 Alternate Level Stairs-Rails: Right Bathroom Shower/Tub: Producer, television/film/video: Handicapped height Bathroom Accessibility: Yes   Home Equipment: Agricultural consultant (2 wheels);Rollator (4 wheels);Shower seat   Additional Comments: supplemental 2LO2 at baselinee      Prior Functioning/Environment Prior Level of Function : Independent/Modified Independent;Driving             Mobility Comments: denies current use of AD, drives ADLs Comments: independent, driving    OT Problem List: Decreased strength;Decreased range of motion;Decreased activity tolerance;Impaired balance (sitting and/or standing);Decreased coordination;Decreased knowledge of precautions;Cardiopulmonary status limiting activity   OT Treatment/Interventions: Self-care/ADL training;Therapeutic exercise;Energy conservation;DME and/or AE instruction;Manual therapy;Therapeutic activities;Patient/family education;Balance training      OT Goals(Current goals can be found in the care plan section)   Acute Rehab OT Goals Patient Stated Goal: to get better OT Goal Formulation: With patient Time For Goal Achievement: 10/27/23 Potential to Achieve Goals: Good   OT Frequency:  Min 1X/week    Co-evaluation              AM-PAC OT "6 Clicks" Daily Activity     Outcome Measure Help from another person eating meals?: A Little Help from  another person taking care of personal grooming?: A Little Help from another person toileting, which includes using toliet, bedpan, or urinal?: A Little Help from another person bathing (including washing, rinsing, drying)?: A  Little Help from another person to put on and taking off regular upper body clothing?: A Little Help from another person to put on and taking off regular lower body clothing?: A Lot 6 Click Score: 17   End of Session Equipment Utilized During Treatment: Rolling walker (2 wheels);Oxygen Nurse Communication: Mobility status  Activity Tolerance: Treatment limited secondary to medical complications (Comment) (Oxygen need) Patient left: in chair;with call bell/phone within reach;with chair alarm set;with nursing/sitter in room  OT Visit Diagnosis: Unsteadiness on feet (R26.81);Muscle weakness (generalized) (M62.81)                Time: 1610-9604 OT Time Calculation (min): 19 min Charges:  OT General Charges $OT Visit: 1 Visit OT Evaluation $OT Eval Moderate Complexity: 1 Mod  Pollyann Glen E. Clearence Vitug, OTR/L Acute Rehabilitation Services [phone]   Cherlyn Cushing 10/13/2023, 1:14 PM

## 2023-10-13 NOTE — Progress Notes (Signed)
 Patients Spow=86-88% while patient wearing oxygen set at 15 lpm salter cannula. Placed patient on heated high flow cannula with sp02 improving to 95%.

## 2023-10-14 ENCOUNTER — Inpatient Hospital Stay (HOSPITAL_COMMUNITY): Payer: Medicare Other

## 2023-10-14 DIAGNOSIS — N1832 Chronic kidney disease, stage 3b: Secondary | ICD-10-CM

## 2023-10-14 DIAGNOSIS — J189 Pneumonia, unspecified organism: Secondary | ICD-10-CM | POA: Diagnosis not present

## 2023-10-14 DIAGNOSIS — I5032 Chronic diastolic (congestive) heart failure: Secondary | ICD-10-CM | POA: Diagnosis not present

## 2023-10-14 DIAGNOSIS — Z515 Encounter for palliative care: Secondary | ICD-10-CM

## 2023-10-14 DIAGNOSIS — Z7189 Other specified counseling: Secondary | ICD-10-CM | POA: Diagnosis not present

## 2023-10-14 DIAGNOSIS — J9621 Acute and chronic respiratory failure with hypoxia: Secondary | ICD-10-CM | POA: Diagnosis not present

## 2023-10-14 LAB — CBC WITH DIFFERENTIAL/PLATELET
Abs Immature Granulocytes: 0.03 10*3/uL (ref 0.00–0.07)
Basophils Absolute: 0 10*3/uL (ref 0.0–0.1)
Basophils Relative: 0 %
Eosinophils Absolute: 0.4 10*3/uL (ref 0.0–0.5)
Eosinophils Relative: 5 %
HCT: 39.5 % (ref 39.0–52.0)
Hemoglobin: 12.8 g/dL — ABNORMAL LOW (ref 13.0–17.0)
Immature Granulocytes: 0 %
Lymphocytes Relative: 8 %
Lymphs Abs: 0.6 10*3/uL — ABNORMAL LOW (ref 0.7–4.0)
MCH: 27.1 pg (ref 26.0–34.0)
MCHC: 32.4 g/dL (ref 30.0–36.0)
MCV: 83.7 fL (ref 80.0–100.0)
Monocytes Absolute: 0.7 10*3/uL (ref 0.1–1.0)
Monocytes Relative: 10 %
Neutro Abs: 5.6 10*3/uL (ref 1.7–7.7)
Neutrophils Relative %: 77 %
Platelets: 162 10*3/uL (ref 150–400)
RBC: 4.72 MIL/uL (ref 4.22–5.81)
RDW: 16.9 % — ABNORMAL HIGH (ref 11.5–15.5)
WBC: 7.3 10*3/uL (ref 4.0–10.5)
nRBC: 0 % (ref 0.0–0.2)

## 2023-10-14 LAB — BASIC METABOLIC PANEL
Anion gap: 10 (ref 5–15)
BUN: 20 mg/dL (ref 8–23)
CO2: 27 mmol/L (ref 22–32)
Calcium: 10.2 mg/dL (ref 8.9–10.3)
Chloride: 103 mmol/L (ref 98–111)
Creatinine, Ser: 1.18 mg/dL (ref 0.61–1.24)
GFR, Estimated: >60 mL/min (ref >60)
Glucose, Bld: 100 mg/dL — ABNORMAL HIGH (ref 70–99)
Potassium: 3.8 mmol/L (ref 3.5–5.1)
Sodium: 140 mmol/L (ref 135–145)

## 2023-10-14 LAB — MAGNESIUM: Magnesium: 1.8 mg/dL (ref 1.7–2.4)

## 2023-10-14 MED ORDER — MIDODRINE HCL 5 MG PO TABS
5.0000 mg | ORAL_TABLET | Freq: Three times a day (TID) | ORAL | Status: DC
  Administered 2023-10-14 – 2023-10-20 (×18): 5 mg via ORAL
  Filled 2023-10-14 (×18): qty 1

## 2023-10-14 MED ORDER — FUROSEMIDE 10 MG/ML IJ SOLN
40.0000 mg | Freq: Once | INTRAMUSCULAR | Status: AC
  Administered 2023-10-14: 40 mg via INTRAVENOUS
  Filled 2023-10-14: qty 4

## 2023-10-14 MED ORDER — RIOCIGUAT 2 MG PO TABS
2.0000 mg | ORAL_TABLET | Freq: Three times a day (TID) | ORAL | Status: DC
  Administered 2023-10-14 – 2023-10-20 (×18): 2 mg via ORAL
  Filled 2023-10-14 (×23): qty 1

## 2023-10-14 MED ORDER — METHYLPREDNISOLONE SODIUM SUCC 125 MG IJ SOLR
80.0000 mg | Freq: Every day | INTRAMUSCULAR | Status: DC
  Administered 2023-10-14 – 2023-10-19 (×6): 80 mg via INTRAVENOUS
  Filled 2023-10-14 (×6): qty 2

## 2023-10-14 NOTE — Progress Notes (Signed)
   10/14/23 2330  BiPAP/CPAP/SIPAP  Reason BIPAP/CPAP not in use Other(comment) (bipap prn, not indicated at this time)

## 2023-10-14 NOTE — Plan of Care (Signed)
  Problem: Nutritional: Goal: Maintenance of adequate nutrition will improve Outcome: Progressing Goal: Progress toward achieving an optimal weight will improve Outcome: Progressing   Problem: Skin Integrity: Goal: Risk for impaired skin integrity will decrease Outcome: Progressing   Problem: Activity: Goal: Risk for activity intolerance will decrease Outcome: Progressing   Problem: Nutrition: Goal: Adequate nutrition will be maintained Outcome: Progressing   Problem: Coping: Goal: Level of anxiety will decrease Outcome: Progressing   Problem: Elimination: Goal: Will not experience complications related to bowel motility Outcome: Progressing Goal: Will not experience complications related to urinary retention Outcome: Progressing

## 2023-10-14 NOTE — TOC Progression Note (Signed)
 Transition of Care [facility]) - Progression Note    Patient Details  Name: Joshua [name] MRN: [id number] Date of Birth: 12/11/1935  Transition of Care [facility]) CM/SW Contact  Marliss Coots, LCSW Phone Number: 10/14/2023, 12:04 PM  Clinical Narrative:     12:04 PM Per RNCM, patient's wife expressed interest in patient discharge to SNF. Per PT, patient is appropriate for SNF level of care. Per progressions, patient is fully oriented but is on 20L HFNC and not medically ready for discharge.   Expected Discharge Plan: Skilled Nursing Facility Barriers to Discharge: Continued Medical Work up  Expected Discharge Plan and Services     Post Acute Care Choice: Skilled Nursing Facility Living arrangements for the past 2 months: Single Family Home                                       Social Determinants of Health (SDOH) Interventions SDOH Screenings   Food Insecurity: No Food Insecurity (10/10/2023)  Housing: Low Risk  (10/12/2023)  Transportation Needs: Unmet Transportation Needs (10/10/2023)  Utilities: Not At Risk (10/10/2023)  Depression (PHQ2-9): Low Risk  (03/13/2022)  Social Connections: Socially Integrated (10/10/2023)  Stress: No Stress Concern Present (05/09/2021)  Tobacco Use: Medium Risk (10/10/2023)    Readmission Risk Interventions    10/05/2021    3:15 PM  Readmission Risk Prevention Plan  Transportation Screening Complete  Medication Review (RN Care Manager) Complete  PCP or Specialist appointment within 3-5 days of discharge Complete  HRI or Home Care Consult Complete  SW Recovery Care/Counseling Consult Complete  Palliative Care Screening Not Applicable  Skilled Nursing Facility Not Applicable

## 2023-10-14 NOTE — Consult Note (Signed)
 Consultation Note Date: 10/14/2023   Patient Name: Joshua [name]  DOB: [birth year]  MRN: [id number]  Age / Sex: 88 y.o., male  PCP: [name], DO Referring Physician: Glade Lloyd, MD  Reason for Consultation: Establishing goals of care and Psychosocial/spiritual support  HPI/Patient Profile: 88 y.o. male   admitted on 10/10/2023 with  PMH significant for  chronic respiratory failure with hypoxia, emphysema, pulmonary hypertension, chronic PE, CKD, aspiration pneumonia, chronic hypotension, OSA presented with worsening shortness of breath.   On presentation, he was hypoxic and required BiPAP for oxygen saturations of 70s. He was febrile with leukocytosis and chest x-ray suggestive of possible pneumonia.   He was started on broad-spectrum antibiotics. Because of persistent hypotension, he was started on pressors and admitted to ICU under PCCM service.   Subsequently, vasopressors have been weaned off with improving blood pressure. Care has been transferred to [facility] service from 10/12/2023 onwards.   Patient and family face treatment option decisions, advanced directive decisions and anticipatory care needs.   Clinical Assessment and Goals of Care:  This NP Lorinda Creed reviewed medical records, received report from team, assessed the patient and then meet at the patient's bedside along with his wife, daughter and brother to discuss diagnosis, prognosis, GOC, EOL wishes disposition and options.   Concept of Palliative Care was introduced as specialized medical care for people and their families living with serious illness.  If focuses on providing relief from the symptoms and stress of a serious illness.  The goal is to improve quality of life for both the patient and the family.Values and goals of care important to patient and family were attempted to be elicited.  Created space and opportunity for patient   and family to explore thoughts and feelings regarding current medical situation.  Patient was independent and driving prior to this hospitalization.     A  discussion was had today regarding advanced directives.  Concepts specific to code status, artifical feeding and hydration, continued IV antibiotics and rehospitalization was had.  The difference between a aggressive medical intervention path  and a palliative comfort care path for this patient at this time was had.       Natural trajectory and expectations at EOL were discussed.  Questions and concerns addressed.  Patient  encouraged to call with questions or concerns.     PMT will continue to support holistically.         Patient's wife is named of H POA.  No other living will or advance care planning documents    Please of    SUMMARY OF RECOMMENDATIONS    Code Status/Advance Care Planning: DNR   Symptom Management:  ***  Palliative Prophylaxis:  Aspiration, Bowel Regimen, Delirium Protocol, and Frequent Pain Assessment  Additional Recommendations (Limitations, Scope, Preferences): Full Scope Treatment  Psycho-social/Spiritual:  Desire for further Chaplaincy support:no Emotional support offered  Prognosis:  Unable to determine  Discharge Planning: To Be Determined      Primary Diagnoses: Present on Admission:  Acute on chronic respiratory  failure with hypoxia (HCC)   I have reviewed the medical record, interviewed the patient and family, and examined the patient. The following aspects are pertinent.  Past Medical History:  Diagnosis Date   Arthritis    BPH (benign prostatic hyperplasia)    CHF (congestive heart failure) (HCC)    Chronic hiccups    COPD (chronic obstructive pulmonary disease) (HCC)    Folliculitis    posterior scalp per office visit note of Dr Selena Batten 07/20/2014     GERD (gastroesophageal reflux disease)    Gout    Hypertension    Hypotension    NSTEMI (non-ST elevated myocardial  infarction) (HCC) 03/19/2022   PE (pulmonary thromboembolism) (HCC)    Phlebitis    right arm  at least 20 years ago    Pneumonia    hx of pneumonia as a child    Pulmonary embolism (HCC) 03/17/2022   Pulmonary hypertension (HCC)    Severe sepsis (HCC) 03/16/2022   Sleep apnea    Social History   Socioeconomic History   Marital status: Married    Spouse name: Joann   Number of children: 2   Years of education: 14   Highest education level: Not on file  Occupational History   Occupation: postal service,[employer] there,school crossing guard. Stopped working in  2022  Tobacco Use   Smoking status: Former    Current packs/day: 0.00    Average packs/day: 2.0 packs/day for 52.0 years (104.0 ttl pk-yrs)    Types: Cigarettes    Start date: 26    Quit date: 08/19/2006    Years since quitting: 17.1   Smokeless tobacco: Never   Tobacco comments:    Former smoke 03/15/22  Vaping Use   Vaping status: Never Used  Substance and Sexual Activity   Alcohol use: Not Currently    Alcohol/week: 6.0 standard drinks of alcohol    Types: 6 Shots of liquor per week    Comment: stopped in 2018   Drug use: No   Sexual activity: Not Currently  Other Topics Concern   Not on file  Social History Narrative   Not on file   Social Drivers of Health   Financial Resource Strain: Not on file  Food Insecurity: No Food Insecurity (10/10/2023)   Hunger Vital Sign    Worried About Running Out of Food in the Last Year: Never true    Ran Out of Food in the Last Year: Never true  Transportation Needs: Unmet Transportation Needs (10/10/2023)   PRAPARE - Transportation    Lack of Transportation (Medical): No    Lack of Transportation (Non-Medical): Yes  Physical Activity: Not on file  Stress: No Stress Concern Present (05/09/2021)   [employer] of Occupational Health - Occupational Stress Questionnaire    Feeling of Stress : Only a little  Social Connections: Socially Integrated  (10/10/2023)   Social Connection and Isolation Panel [NHANES]    Frequency of Communication with Friends and Family: Three times a week    Frequency of Social Gatherings with Friends and Family: Three times a week    Attends Religious Services: 1 to 4 times per year    Active Member of Clubs or Organizations: No    Attends Banker Meetings: 1 to 4 times per year    Marital Status: Married   Family History  Problem Relation Age of Onset   Heart attack Brother 42   Scheduled Meds:  apixaban  5 mg Oral  BID   arformoterol  15 mcg Nebulization BID   budesonide (PULMICORT) nebulizer solution  0.5 mg Nebulization BID   Chlorhexidine Gluconate Cloth  6 each Topical Daily   methylPREDNISolone (SOLU-MEDROL) injection  80 mg Intravenous Daily   midodrine  5 mg Oral TID WC   pantoprazole  40 mg Oral Daily   revefenacin  175 mcg Nebulization Daily   Riociguat  2 mg Oral TID   Continuous Infusions:  ampicillin-sulbactam (UNASYN) IV 3 g (10/14/23 1131)   PRN Meds:.albuterol, docusate sodium, metoCLOPramide (REGLAN) injection, mouth rinse, polyethylene glycol Medications Prior to Admission:  Prior to Admission medications   Medication Sig Start Date End Date Taking? Authorizing Provider  ADEMPAS 2 MG TABS TAKE 1 TABLET BY MOUTH 3 TIMES A DAY AS DIRECTED. DO NOT HANDLE IF PREGNANT 08/26/23  Yes Laurey Morale, MD  albuterol (VENTOLIN HFA) 108 (90 Base) MCG/ACT inhaler Inhale 1 puff into the lungs every 4 (four) hours as needed for shortness of breath.   Yes [provider]  allopurinol (ZYLOPRIM) 100 MG tablet Take 100 mg by mouth every morning.   Yes [provider]  atorvastatin (LIPITOR) 40 MG tablet TAKE 1 TABLET BY MOUTH EVERY DAY 06/30/23  Yes Milford, Savannah, FNP  azelastine (ASTELIN) 0.1 % nasal spray Place 2 sprays into both nostrils 2 (two) times daily as needed for rhinitis. Use in each nostril as directed   Yes [provider]  Cinnamon 500 MG  capsule Take 500 mg by mouth every morning.   Yes [provider]  dapagliflozin propanediol (FARXIGA) 10 MG TABS tablet Take 1 tablet (10 mg total) by mouth daily before breakfast. 02/25/23  Yes Milford, Anderson Malta, FNP  ELIQUIS 5 MG TABS tablet TAKE 1 TABLET BY MOUTH TWICE A DAY 07/21/23  Yes Laurey Morale, MD  finasteride (PROSCAR) 5 MG tablet Take 5 mg by mouth at bedtime.   Yes [provider]  gabapentin (NEURONTIN) 300 MG capsule Take 300 mg by mouth 3 (three) times daily. 04/04/22  Yes [provider]  magnesium oxide (MAG-OX) 400 (240 Mg) MG tablet TAKE 1 TABLET BY MOUTH EVERY DAY 09/29/23  Yes Laurey Morale, MD  metoCLOPramide (REGLAN) 5 MG tablet Take 5 mg by mouth 3 (three) times daily before meals.   Yes [provider]  midodrine (PROAMATINE) 5 MG tablet TAKE 3 TABLETS BY MOUTH 3 TIMES DAILY. Patient taking differently: Take 5 mg by mouth daily after lunch. 09/05/23  Yes Laurey Morale, MD  Multiple Vitamin (MULTIVITAMIN WITH MINERALS) TABS tablet Take 1 tablet by mouth daily. 05/01/21  Yes Sheikh, Omair Latif, DO  OXYGEN Inhale 2 L into the lungs continuous.   Yes [provider]  pantoprazole (PROTONIX) 40 MG tablet Take 1 tablet (40 mg total) by mouth every morning. 03/07/23  Yes Cobb, Ruby Cola, NP  potassium chloride (KLOR-CON) 10 MEQ tablet Take 10 mEq by mouth 2 (two) times daily.   Yes [provider]  Tiotropium Bromide-Olodaterol (STIOLTO RESPIMAT) 2.5-2.5 MCG/ACT AERS Inhale 2 puffs into the lungs daily. Patient taking differently: Inhale 1 puff into the lungs daily. 08/04/23  Yes Oretha Milch, MD  torsemide (DEMADEX) 20 MG tablet Take 20 mg by mouth daily.   Yes [provider]   Allergies  Allergen Reactions   Other Swelling    Beer- Swelling    Sunflower Oil Swelling   Sulfa Antibiotics Rash   Review of Systems  Constitutional:  Positive for  appetite change.  Respiratory:  Positive for shortness of  breath.   Neurological:  Positive for weakness.    Physical Exam Cardiovascular:     Rate and Rhythm: Normal rate.  Skin:    General: Skin is warm and dry.  Neurological:     Mental Status: He is alert.     Vital Signs: BP 123/79   Pulse 98   Temp 97.8 F (36.6 C) (Oral)   Resp 16   Ht 6\' 3"  (1.905 m)   Wt 83.1 kg   SpO2 92%   BMI 22.90 kg/m  Pain Scale: 0-10 POSS *See Group Information*: S-Acceptable,Sleep, easy to arouse Pain Score: 0-No pain   SpO2: SpO2: 92 % O2 Device:SpO2: 92 % O2 Flow Rate: .O2 Flow Rate (L/min): 20 L/min  IO: Intake/output summary:  Intake/Output Summary (Last 24 hours) at 10/14/2023 1448 Last data filed at 10/14/2023 1141 Gross per 24 hour  Intake 400 ml  Output 2350 ml  Net -1950 ml    LBM: Last BM Date : 10/14/23 Baseline Weight: Weight: 104.3 kg Most recent weight: Weight: 83.1 kg     Palliative Assessment/Data:   Discussed with Dr. Hanley Ben and treatment team via secure chat  Time  75 minutes   Signed by: Lorinda Creed, NP   Please contact Palliative Medicine Team phone at [phone] for questions and concerns.  For individual provider: See Loretha Stapler

## 2023-10-14 NOTE — Progress Notes (Addendum)
 PROGRESS NOTE    Joshua [name]  GNF:[id number] DOB: 10-07-1935 DOA: 10/10/2023 PCP: [name], DO   Brief Narrative:  88 year old male with chronic respiratory failure with hypoxia, emphysema, pulmonary hypertension, chronic PE, CKD, aspiration pneumonia, chronic hypotension, OSA presented with worsening shortness of breath.  On presentation, he was hypoxic and required BiPAP for oxygen saturations of 70s.  He was febrile with leukocytosis and chest x-ray suggestive of possible pneumonia.  He was started on broad-spectrum antibiotics.  Because of persistent hypotension, he was started on pressors and admitted to ICU under PCCM service.  Subsequently, vasopressors have been weaned off with improving blood pressure.  Care has been transferred to [facility] service from 10/12/2023 onwards.  Assessment & Plan:   Septic shock: Present on admission Bilateral pneumonia with possibility of aspiration pneumonia -Presented with fever, leukocytosis with possible pneumonia and was persistently hypotensive requiring pressors and admission to ICU under PCCM service. -Cultures have been negative so far.  Respiratory panel PCR negative.  Influenza/COVID/RSV PCR negative.  Off pressors.  Blood pressure has mostly improved and currently intermittently on the high side -Care has been transferred to [facility] service from 10/12/2023 onwards. -Antibiotics have been switched to IV Unasyn.   -Diet as per SLP recommendations  Acute on chronic hypoxic respiratory failure COPD/emphysema -Required BiPAP on presentation.   -Respiratory status initially improved but is apparently worsening and patient is on 20 L high flow nasal cannula oxygen -Continue current nebulized regimen -Chest x-ray on 10/13/2023 showed patchy bilateral airspace opacities -Start Solu-Medrol.  Will give her dose of Lasix IV today as well.   Chronic hypotension -Currently on midodrine.  Blood pressure intermittently on the higher side. Might have to  decrease dose or discontinue if BP continues to remain elevated.  Chronic diastolic heart failure and pulmonary hypertension -Echo showed EF of 65 to 70% with grade 1 diastolic dysfunction.  Continue strict input and output.  Daily weights.  Fluid restriction. -Outpatient follow-up with cardiology  Chronic PE -Continue Eliquis  AKI on CKD stage IIIa -Creatinine 1.18 today.  Monitor   leukocytosis -Resolved  Hypomagnesemia -Improved  Lactic acidosis -Improving  Thrombocytopenia -Resolved  Physical deconditioning -Family requesting SNF placement.  TOC consulted.  Goals of care -Overall prognosis is guarded to poor given worsening respiratory status.  Consult palliative care for goals of care discussion    DVT prophylaxis: Eliquis Code Status: DNR Family Communication: Daughter at bedside Disposition Plan: Status is: Inpatient Remains inpatient appropriate because: Of severity of illness   Consultants: PCCM.  Consult palliative care  Procedures: 2D echo  Antimicrobials:  Anti-infectives (From admission, onward)    Start     Dose/Rate Route Frequency Ordered Stop   10/11/23 1245  Ampicillin-Sulbactam (UNASYN) 3 g in sodium chloride 0.9 % 100 mL IVPB        3 g 200 mL/hr over 30 Minutes Intravenous Every 6 hours 10/11/23 1146     10/10/23 2200  ceFEPIme (MAXIPIME) 2 g in sodium chloride 0.9 % 100 mL IVPB  Status:  Discontinued        2 g 200 mL/hr over 30 Minutes Intravenous Every 12 hours 10/10/23 1700 10/11/23 1146   10/10/23 1659  vancomycin variable dose per unstable renal function (pharmacist dosing)  Status:  Discontinued         Does not apply See admin instructions 10/10/23 1700 10/11/23 1146   10/10/23 0930  vancomycin (VANCOCIN) IVPB 1000 mg/200 mL premix       Placed in "And" Linked  Group   1,000 mg 200 mL/hr over 60 Minutes Intravenous  Once 10/10/23 0922 10/10/23 1105   10/10/23 0930  vancomycin (VANCOCIN) IVPB 1000 mg/200 mL premix       Placed in  "And" Linked Group   1,000 mg 200 mL/hr over 60 Minutes Intravenous  Once 10/10/23 0922 10/10/23 1105   10/10/23 0930  ceFEPIme (MAXIPIME) 2 g in sodium chloride 0.9 % 100 mL IVPB        2 g 200 mL/hr over 30 Minutes Intravenous  Once 10/10/23 0922 10/10/23 1058   10/10/23 0930  metroNIDAZOLE (FLAGYL) IVPB 500 mg        500 mg 100 mL/hr over 60 Minutes Intravenous  Once 10/10/23 2841 10/10/23 1106        Subjective: Patient seen and examined at bedside.  Remains short of breath with exertion with intermittent cough.  No fever, chest pain, vomiting reported  objective: Vitals:   10/13/23 2013 10/13/23 2226 10/13/23 2352 10/14/23 0150  BP: (!) 152/94  (!) 127/94   Pulse: (!) 116 88 (!) 108 97  Resp: 20 (!) 21 17 20   Temp: 98 F (36.7 C)  97.6 F (36.4 C)   TempSrc: Oral  Axillary   SpO2: 98% 98% 95% 97%  Weight:      Height:        Intake/Output Summary (Last 24 hours) at 10/14/2023 0639 Last data filed at 10/14/2023 0340 Gross per 24 hour  Intake 400 ml  Output 1100 ml  Net -700 ml   Filed Weights   10/12/23 0251 10/12/23 0645 10/13/23 0253  Weight: 83 kg 83 kg 83.1 kg    Examination:  General: No distress.  On 20 L high flow nasal cannula oxygen.  Elderly male lying in bed.   ENT/neck: No obvious neck masses or JVD elevation noted respiratory: Bilateral decreased breath sounds at bases with scattered crackles CVS: Intermittent tachycardia present; S1 and S2 are heard abdominal: Soft, nontender, distended mildly; no organomegaly, bowel sounds normally heard  extremities: No clubbing; mild lower extremity edema present CNS: Alert and oriented.  Still slow to respond.  No obvious focal deficits noted lymph: No obvious palpable lymphadenopathy Skin: No obvious petechiae/rashes psych: Not agitated currently.  Flat affect mostly  musculoskeletal: No obvious joint tenderness/erythema     Data Reviewed: I have personally reviewed following labs and imaging  studies  CBC: Recent Labs  Lab 10/10/23 1234 10/11/23 0842 10/13/23 0226 10/14/23 0158  WBC 15.4* 16.7* 10.1 7.3  NEUTROABS 14.3*  --  8.4* 5.6  HGB 13.0 12.4* 12.8* 12.8*  HCT 40.7 39.1 40.2 39.5  MCV 85.9 85.6 84.5 83.7  PLT 137* 126* 144* 162   Basic Metabolic Panel: Recent Labs  Lab 10/10/23 0935 10/11/23 0842 10/13/23 0226 10/14/23 0158  NA 142 137 138 140  K 4.4 4.1 3.8 3.8  CL 97* 99 101 103  CO2 29 29 28 27   GLUCOSE 120* 112* 116* 100*  BUN 35* 28* 19 20  CREATININE 1.94* 1.48* 0.98 1.18  CALCIUM 9.9 9.6 10.0 10.2  MG  --   --  1.6* 1.8   GFR: Estimated Creatinine Clearance: 53.8 mL/min (by C-G formula based on SCr of 1.18 mg/dL). Liver Function Tests: Recent Labs  Lab 10/10/23 0935 10/13/23 0226  AST 37 35  ALT 14 19  ALKPHOS 78 62  BILITOT 1.4* 1.1  PROT 7.3 6.2*  ALBUMIN 3.7 2.7*   No results for input(s): "LIPASE", "AMYLASE" in the last 168  hours. No results for input(s): "AMMONIA" in the last 168 hours. Coagulation Profile: No results for input(s): "INR", "PROTIME" in the last 168 hours. Cardiac Enzymes: No results for input(s): "CKTOTAL", "CKMB", "CKMBINDEX", "TROPONINI" in the last 168 hours. BNP (last 3 results) No results for input(s): "PROBNP" in the last 8760 hours. HbA1C: No results for input(s): "HGBA1C" in the last 72 hours.  CBG: Recent Labs  Lab 10/10/23 1516 10/10/23 2122 10/11/23 0830 10/11/23 2114  GLUCAP 105* 169* 106* 137*   Lipid Profile: No results for input(s): "CHOL", "HDL", "LDLCALC", "TRIG", "CHOLHDL", "LDLDIRECT" in the last 72 hours. Thyroid Function Tests: No results for input(s): "TSH", "T4TOTAL", "FREET4", "T3FREE", "THYROIDAB" in the last 72 hours. Anemia Panel: No results for input(s): "VITAMINB12", "FOLATE", "FERRITIN", "TIBC", "IRON", "RETICCTPCT" in the last 72 hours. Sepsis Labs: Recent Labs  Lab 10/10/23 1610 10/10/23 1125  LATICACIDVEN 2.9* 2.2*    Recent Results (from the past 240 hours)   Resp panel by RT-PCR (RSV, Flu A&B, Covid) Anterior Nasal Swab     Status: None   Collection Time: 10/10/23  9:17 AM   Specimen: Anterior Nasal Swab  Result Value Ref Range Status   SARS Coronavirus 2 by RT PCR NEGATIVE NEGATIVE Final   Influenza A by PCR NEGATIVE NEGATIVE Final   Influenza B by PCR NEGATIVE NEGATIVE Final    Comment: (NOTE) The Xpert Xpress SARS-CoV-2/FLU/RSV plus assay is intended as an aid in the diagnosis of influenza from Nasopharyngeal swab specimens and should not be used as a sole basis for treatment. Nasal washings and aspirates are unacceptable for Xpert Xpress SARS-CoV-2/FLU/RSV testing.  Fact Sheet for Patients: BloggerCourse.com  Fact Sheet for Healthcare Providers: SeriousBroker.it  This test is not yet approved or cleared by the [place] FDA and has been authorized for detection and/or diagnosis of SARS-CoV-2 by FDA under an Emergency Use Authorization (EUA). This EUA will remain in effect (meaning this test can be used) for the duration of the COVID-19 declaration under Section 564(b)(1) of the Act, 21 U.S.C. section 360bbb-3(b)(1), unless the authorization is terminated or revoked.     Resp Syncytial Virus by PCR NEGATIVE NEGATIVE Final    Comment: (NOTE) Fact Sheet for Patients: BloggerCourse.com  Fact Sheet for Healthcare Providers: SeriousBroker.it  This test is not yet approved or cleared by the [place] FDA and has been authorized for detection and/or diagnosis of SARS-CoV-2 by FDA under an Emergency Use Authorization (EUA). This EUA will remain in effect (meaning this test can be used) for the duration of the COVID-19 declaration under Section 564(b)(1) of the Act, 21 U.S.C. section 360bbb-3(b)(1), unless the authorization is terminated or revoked.  Performed at [facility], [address] [place],  [place] [address]   Culture, blood (routine x 2)     Status: None (Preliminary result)   Collection Time: 10/10/23  9:25 AM   Specimen: BLOOD RIGHT ARM  Result Value Ref Range Status   Specimen Description BLOOD RIGHT ARM  Final   Special Requests   Final    BOTTLES DRAWN AEROBIC AND ANAEROBIC Blood Culture adequate volume   Culture   Final    NO GROWTH 4 DAYS Performed at [facility], [address], [place], [place] [address]    Report Status PENDING  Incomplete  Culture, blood (routine x 2)     Status: None (Preliminary result)   Collection Time: 10/10/23  9:30 AM   Specimen: BLOOD RIGHT ARM  Result Value Ref Range Status  Specimen Description BLOOD RIGHT ARM  Final   Special Requests   Final    BOTTLES DRAWN AEROBIC AND ANAEROBIC Blood Culture adequate volume   Culture   Final    NO GROWTH 4 DAYS Performed at [facility], [address] [place], [place] [address]    Report Status PENDING  Incomplete  Respiratory (~20 pathogens) panel by PCR     Status: None   Collection Time: 10/10/23  1:27 PM   Specimen: Nasopharyngeal Swab; Respiratory  Result Value Ref Range Status   Adenovirus NOT DETECTED NOT DETECTED Final   Coronavirus 229E NOT DETECTED NOT DETECTED Final    Comment: (NOTE) The Coronavirus on the Respiratory Panel, DOES NOT test for the novel  Coronavirus (2019 nCoV)    Coronavirus HKU1 NOT DETECTED NOT DETECTED Final   Coronavirus NL63 NOT DETECTED NOT DETECTED Final   Coronavirus OC43 NOT DETECTED NOT DETECTED Final   Metapneumovirus NOT DETECTED NOT DETECTED Final   Rhinovirus / Enterovirus NOT DETECTED NOT DETECTED Final   Influenza A NOT DETECTED NOT DETECTED Final   Influenza B NOT DETECTED NOT DETECTED Final   Parainfluenza Virus 1 NOT DETECTED NOT DETECTED Final   Parainfluenza Virus 2 NOT DETECTED NOT DETECTED Final   Parainfluenza Virus 3 NOT DETECTED NOT DETECTED Final   Parainfluenza Virus 4 NOT DETECTED NOT DETECTED Final   Respiratory  Syncytial Virus NOT DETECTED NOT DETECTED Final   Bordetella pertussis NOT DETECTED NOT DETECTED Final   Bordetella Parapertussis NOT DETECTED NOT DETECTED Final   Chlamydophila pneumoniae NOT DETECTED NOT DETECTED Final   Mycoplasma pneumoniae NOT DETECTED NOT DETECTED Final    Comment: Performed at [facility], [address], [place], [place] [address]  MRSA Next Gen by PCR, Nasal     Status: None   Collection Time: 10/10/23  2:45 PM   Specimen: Nasal Mucosa; Nasal Swab  Result Value Ref Range Status   MRSA by PCR Next Gen NOT DETECTED NOT DETECTED Final    Comment: (NOTE) The GeneXpert MRSA Assay (FDA approved for NASAL specimens only), is one component of a comprehensive MRSA colonization surveillance program. It is not intended to diagnose MRSA infection nor to guide or monitor treatment for MRSA infections. Test performance is not FDA approved in patients less than 40 years old. Performed at [facility], [address], [place], [place] [address]   Expectorated Sputum Assessment w Gram Stain, Rflx to Resp Cult     Status: None   Collection Time: 10/10/23 11:25 PM   Specimen: Expectorated Sputum  Result Value Ref Range Status   Specimen Description EXPECTORATED SPUTUM  Final   Special Requests NONE  Final   Sputum evaluation   Final    THIS SPECIMEN IS ACCEPTABLE FOR SPUTUM CULTURE Performed at [facility], [address] [place], [place] [address]    Report Status 10/11/2023 FINAL  Final  Culture, Respiratory w Gram Stain     Status: None   Collection Time: 10/10/23 11:25 PM  Result Value Ref Range Status   Specimen Description EXPECTORATED SPUTUM  Final   Special Requests NONE Reflexed from [id number]  Final   Gram Stain   Final    FEW WBC PRESENT, PREDOMINANTLY PMN NO ORGANISMS SEEN    Culture   Final    FEW Normal respiratory flora-no Staph aureus or Pseudomonas seen Performed at [facility], [address] [place], [place] [address]  Report Status 10/13/2023 FINAL  Final         Radiology Studies: DG CHEST PORT 1 VIEW Result Date: 10/13/2023 CLINICAL DATA:  Dyspnea EXAM: PORTABLE CHEST 1 VIEW COMPARISON:  10/10/2023 FINDINGS: Cardiomegaly. Aortic atherosclerosis. Patchy bilateral airspace opacities, progressed within the left perihilar and left basilar region. Improved aeration at the right lung base. No pleural effusion or pneumothorax. IMPRESSION: Patchy bilateral airspace opacities, progressed within the left perihilar and left basilar region. Improved aeration at the right lung base. Electronically Signed   By: Duanne Guess D.O.   On: 10/13/2023 14:42        Scheduled Meds:  apixaban  5 mg Oral BID   arformoterol  15 mcg Nebulization BID   budesonide (PULMICORT) nebulizer solution  0.5 mg Nebulization BID   Chlorhexidine Gluconate Cloth  6 each Topical Daily   midodrine  10 mg Oral TID WC   pantoprazole  40 mg Oral Daily   revefenacin  175 mcg Nebulization Daily   Continuous Infusions:  ampicillin-sulbactam (UNASYN) IV 3 g (10/14/23 0615)          Glade Lloyd, MD [facility] 10/14/2023, 6:39 AM

## 2023-10-14 NOTE — Plan of Care (Signed)

## 2023-10-15 DIAGNOSIS — A419 Sepsis, unspecified organism: Secondary | ICD-10-CM | POA: Diagnosis not present

## 2023-10-15 DIAGNOSIS — R6521 Severe sepsis with septic shock: Secondary | ICD-10-CM

## 2023-10-15 DIAGNOSIS — E119 Type 2 diabetes mellitus without complications: Secondary | ICD-10-CM

## 2023-10-15 LAB — CULTURE, BLOOD (ROUTINE X 2)
Culture: NO GROWTH
Culture: NO GROWTH
Special Requests: ADEQUATE
Special Requests: ADEQUATE

## 2023-10-15 LAB — BASIC METABOLIC PANEL
Anion gap: 8 (ref 5–15)
BUN: 28 mg/dL — ABNORMAL HIGH (ref 8–23)
CO2: 30 mmol/L (ref 22–32)
Calcium: 10.9 mg/dL — ABNORMAL HIGH (ref 8.9–10.3)
Chloride: 103 mmol/L (ref 98–111)
Creatinine, Ser: 1.21 mg/dL (ref 0.61–1.24)
GFR, Estimated: 59 mL/min — ABNORMAL LOW (ref >60)
Glucose, Bld: 142 mg/dL — ABNORMAL HIGH (ref 70–99)
Potassium: 4.1 mmol/L (ref 3.5–5.1)
Sodium: 141 mmol/L (ref 135–145)

## 2023-10-15 LAB — CBC WITH DIFFERENTIAL/PLATELET
Abs Immature Granulocytes: 0.05 10*3/uL (ref 0.00–0.07)
Basophils Absolute: 0 10*3/uL (ref 0.0–0.1)
Basophils Relative: 0 %
Eosinophils Absolute: 0 10*3/uL (ref 0.0–0.5)
Eosinophils Relative: 0 %
HCT: 42.3 % (ref 39.0–52.0)
Hemoglobin: 13.9 g/dL (ref 13.0–17.0)
Immature Granulocytes: 1 %
Lymphocytes Relative: 6 %
Lymphs Abs: 0.6 10*3/uL — ABNORMAL LOW (ref 0.7–4.0)
MCH: 27.3 pg (ref 26.0–34.0)
MCHC: 32.9 g/dL (ref 30.0–36.0)
MCV: 82.9 fL (ref 80.0–100.0)
Monocytes Absolute: 0.7 10*3/uL (ref 0.1–1.0)
Monocytes Relative: 7 %
Neutro Abs: 8.1 10*3/uL — ABNORMAL HIGH (ref 1.7–7.7)
Neutrophils Relative %: 86 %
Platelets: 196 10*3/uL (ref 150–400)
RBC: 5.1 MIL/uL (ref 4.22–5.81)
RDW: 16.5 % — ABNORMAL HIGH (ref 11.5–15.5)
WBC: 9.4 10*3/uL (ref 4.0–10.5)
nRBC: 0 % (ref 0.0–0.2)

## 2023-10-15 LAB — MAGNESIUM: Magnesium: 1.8 mg/dL (ref 1.7–2.4)

## 2023-10-15 NOTE — Progress Notes (Signed)
 Progress Note    Joshua [name]   FAO:[id number]  DOB: 18-Jan-1936  DOA: 10/10/2023     5 PCP: [name], DO  Initial CC: SOB  Hospital Course: 88 year old male with chronic respiratory failure with hypoxia on 2-3L O2 at home, emphysema, pulmonary hypertension, chronic PE, CKD, aspiration pneumonia, chronic hypotension, OSA presented with worsening shortness of breath. On presentation, he was hypoxic and required BiPAP for oxygen saturations of 70s. He was febrile with leukocytosis and chest x-ray suggestive of possible pneumonia. He was started on broad-spectrum antibiotics. Because of persistent hypotension, he was started on pressors and admitted to ICU under PCCM service. Subsequently, vasopressors have been weaned off with improving blood pressure. Care has been transferred to [facility] service from 10/12/2023 onwards.   Interval History:  No events overnight.  Resting comfortably in bed.  Family present bedside. Still remains on salter high flow; weaned some since yesterday.  Assessment and Plan:  Septic shock: Present on admission Bilateral pneumonia with possibility of aspiration pneumonia -Presented with fever, leukocytosis with possible pneumonia and was persistently hypotensive requiring pressors and admission to ICU under PCCM service. -Cultures have been negative so far.  Respiratory panel PCR negative.  Influenza/COVID/RSV PCR negative.  Off pressors.  Blood pressure has mostly improved and currently intermittently on the high side -Care has been transferred to [facility] service from 10/12/2023 onwards. -Antibiotics have been switched to IV Unasyn.   -Diet as per SLP recommendations   Acute on chronic hypoxic respiratory failure COPD/emphysema -Required BiPAP on presentation.   - on 2-3L chronic O2 at home - continue solumedrol - wean O2 as able - if slow to wean, can consider LTAC eval    Chronic hypotension -Currently on midodrine.  Blood pressure intermittently on the  higher side. Might have to decrease dose or discontinue if BP continues to remain elevated.   Chronic diastolic heart failure Pulmonary hypertension -Echo showed EF of 65 to 70% with grade 1 diastolic dysfunction.  Continue strict input and output.  Daily weights.  Fluid restriction. -Outpatient follow-up with cardiology   Chronic PE -Continue Eliquis   AKI on CKD stage IIIa - baseline around 1.5 - 1.6; GFR ~ 40 -Creatinine peaked at 1.94 on admission -Now back to baseline   Leukocytosis -Resolved   Hypomagnesemia -Replete as needed   Lactic acidosis -Resolved   Thrombocytopenia -Resolved   Physical deconditioning -Family requesting SNF placement.  TOC consulted.   Goals of care -Overall prognosis is guarded given respiratory status.  -Hold off on palliative care consult for now   Old records reviewed in assessment of this patient  Antimicrobials: Unasyn 2/22 >> current   DVT prophylaxis:   apixaban (ELIQUIS) tablet 5 mg   Code Status:   Code Status: Do not attempt resuscitation (DNR) PRE-ARREST INTERVENTIONS DESIRED  Mobility Assessment (Last 72 Hours)     Mobility Assessment     Row Name 10/15/23 1319 10/14/23 2028 10/14/23 0800 10/13/23 2032 10/13/23 1300   Does patient have an order for bedrest or is patient medically unstable -- No - Continue assessment No - Continue assessment No - Continue assessment --   What is the highest level of mobility based on the progressive mobility assessment? Level 4 (Walks with assist in room) - Balance while marching in place and cannot step forward and back - Complete Level 2 (Chairfast) - Balance while sitting on edge of bed and cannot stand Level 2 (Chairfast) - Balance while sitting on edge of bed and cannot stand  Level 2 (Chairfast) - Balance while sitting on edge of bed and cannot stand Level 4 (Walks with assist in room) - Balance while marching in place and cannot step forward and back - Complete   Is the above level  different from baseline mobility prior to current illness? -- Yes - Recommend PT order Yes - Recommend PT order Yes - Recommend PT order --    Row Name 10/13/23 0800 10/13/23 0758 10/12/23 1930       Does patient have an order for bedrest or is patient medically unstable -- No - Continue assessment No - Continue assessment     What is the highest level of mobility based on the progressive mobility assessment? Level 4 (Walks with assist in room) - Balance while marching in place and cannot step forward and back - Complete Level 2 (Chairfast) - Balance while sitting on edge of bed and cannot stand Level 2 (Chairfast) - Balance while sitting on edge of bed and cannot stand     Is the above level different from baseline mobility prior to current illness? -- Yes - Recommend PT order Yes - Recommend PT order              Barriers to discharge:  Disposition Plan:  SNF HH orders placed: n/a Status is: Inpt  Objective: Blood pressure 115/70, pulse 92, temperature 98.4 F (36.9 C), temperature source Oral, resp. rate 17, height 6\' 3"  (1.905 m), weight 82.3 kg, SpO2 95%.  Examination:  Physical Exam Constitutional:      General: He is not in acute distress.    Appearance: Normal appearance.  HENT:     Head: Normocephalic and atraumatic.     Mouth/Throat:     Mouth: Mucous membranes are moist.  Eyes:     Extraocular Movements: Extraocular movements intact.  Cardiovascular:     Rate and Rhythm: Normal rate and regular rhythm.  Pulmonary:     Effort: Pulmonary effort is normal. No respiratory distress.     Breath sounds: Normal breath sounds. No wheezing.  Abdominal:     General: Bowel sounds are normal. There is no distension.     Palpations: Abdomen is soft.     Tenderness: There is no abdominal tenderness.  Musculoskeletal:        General: Normal range of motion.     Cervical back: Normal range of motion and neck supple.  Skin:    General: Skin is warm and dry.  Neurological:      General: No focal deficit present.     Mental Status: He is alert.  Psychiatric:        Mood and Affect: Mood normal.        Behavior: Behavior normal.      Consultants:    Procedures:    Data Reviewed: Results for orders placed or performed during the hospital encounter of 10/10/23 (from the past 24 hours)  CBC with Differential/Platelet     Status: Abnormal   Collection Time: 10/15/23  2:49 AM  Result Value Ref Range   WBC 9.4 4.0 - 10.5 K/uL   RBC 5.10 4.22 - 5.81 MIL/uL   Hemoglobin 13.9 13.0 - 17.0 g/dL   HCT 40.9 81.1 - 91.4 %   MCV 82.9 80.0 - 100.0 fL   MCH 27.3 26.0 - 34.0 pg   MCHC 32.9 30.0 - 36.0 g/dL   RDW 78.2 (H) 95.6 - 21.3 %   Platelets 196 150 - 400 K/uL   nRBC 0.0  0.0 - 0.2 %   Neutrophils Relative % 86 %   Neutro Abs 8.1 (H) 1.7 - 7.7 K/uL   Lymphocytes Relative 6 %   Lymphs Abs 0.6 (L) 0.7 - 4.0 K/uL   Monocytes Relative 7 %   Monocytes Absolute 0.7 0.1 - 1.0 K/uL   Eosinophils Relative 0 %   Eosinophils Absolute 0.0 0.0 - 0.5 K/uL   Basophils Relative 0 %   Basophils Absolute 0.0 0.0 - 0.1 K/uL   Immature Granulocytes 1 %   Abs Immature Granulocytes 0.05 0.00 - 0.07 K/uL  Basic metabolic panel     Status: Abnormal   Collection Time: 10/15/23  2:49 AM  Result Value Ref Range   Sodium 141 135 - 145 mmol/L   Potassium 4.1 3.5 - 5.1 mmol/L   Chloride 103 98 - 111 mmol/L   CO2 30 22 - 32 mmol/L   Glucose, Bld 142 (H) 70 - 99 mg/dL   BUN 28 (H) 8 - 23 mg/dL   Creatinine, Ser 1.61 0.61 - 1.24 mg/dL   Calcium 09.6 (H) 8.9 - 10.3 mg/dL   GFR, Estimated 59 (L) >60 mL/min   Anion gap 8 5 - 15  Magnesium     Status: None   Collection Time: 10/15/23  2:49 AM  Result Value Ref Range   Magnesium 1.8 1.7 - 2.4 mg/dL    I have reviewed pertinent nursing notes, vitals, labs, and images as necessary. I have ordered labwork to follow up on as indicated.  I have reviewed the last notes from staff over past 24 hours. I have discussed patient's care plan  and test results with nursing staff, CM/SW, and other staff as appropriate.  Time spent: Greater than 50% of the 55 minute visit was spent in counseling/coordination of care for the patient as laid out in the A&P.   LOS: 5 days   Lewie Chamber, MD [facility] 10/15/2023, 2:07 PM

## 2023-10-15 NOTE — Progress Notes (Signed)
 Physical Therapy Treatment Patient Details Name: Joshua [name] MRN: [id number] DOB: 02/15/1936 Today's Date: 10/15/2023   History of Present Illness 88 year old male who presented with worsening shortness of breath PMH: chronic respiratory failure with hypoxia, emphysema, pulmonary hypertension, chronic PE, CKD, aspiration pneumonia, chronic hypotension, OSA    PT Comments  Pt is slowly progressing. Pt performed well today without an AD at [facility] to supervision for bed mobility, sit to stand and tranfers. Pt has increased O2 needs from last session. Due to pt current functional status, home set up and available assistance at home recommending skilled physical therapy services 3x/week in order to address strength, balance and functional mobility to decrease risk for falls, injury and re-hospitalization.      If plan is discharge home, recommend the following: A little help with walking and/or transfers;A little help with bathing/dressing/bathroom;Help with stairs or ramp for entrance     Equipment Recommendations  None recommended by PT       Precautions / Restrictions Precautions Precautions: Fall Precaution/Restrictions Comments: watch SpO2, pt on HHFNC 16 L 41% fiO2 Restrictions Weight Bearing Restrictions Per Provider Order: No     Mobility  Bed Mobility Overal bed mobility: Needs Assistance Bed Mobility: Supine to Sit     Supine to sit: Contact guard          Transfers Overall transfer level: Needs assistance Equipment used: None Transfers: Sit to/from Stand, Bed to chair/wheelchair/BSC Sit to Stand: Contact guard assist           General transfer comment: CGA from EOB to recliner without an AD  and o2 sats 91% on HHFNC 16L 41% FiO2    Ambulation/Gait             Pre-gait activities: Pt took steps from EOB to recliner, declined gait at this time due to lunch arrived and pt was concerned he wouldn't be able to walk. Family in room        Balance Overall  balance assessment: Needs assistance Sitting-balance support: Feet supported, No upper extremity supported Sitting balance-Leahy Scale: Fair     Standing balance support: During functional activity, Reliant on assistive device for balance, No upper extremity supported Standing balance-Leahy Scale: Fair Standing balance comment: No overt LOB        Communication Communication Communication: No apparent difficulties  Cognition Arousal: Alert Behavior During Therapy: WFL for tasks assessed/performed   PT - Cognitive impairments: No apparent impairments         Following commands: Intact      Cueing Cueing Techniques: Verbal cues     General Comments General comments (skin integrity, edema, etc.): SPo2 down to 91% on HHFNC 16L      Pertinent Vitals/Pain Pain Assessment Pain Assessment: No/denies pain     PT Goals (current goals can now be found in the care plan section) Acute Rehab PT Goals Patient Stated Goal: home PT Goal Formulation: With patient Time For Goal Achievement: 10/27/23 Potential to Achieve Goals: Good Progress towards PT goals: Progressing toward goals    Frequency    Min 1X/week       AM-PAC PT "6 Clicks" Mobility   Outcome Measure  Help needed turning from your back to your side while in a flat bed without using bedrails?: A Little Help needed moving from lying on your back to sitting on the side of a flat bed without using bedrails?: A Little Help needed moving to and from a bed to a chair (including a wheelchair)?: A  Little Help needed standing up from a chair using your arms (e.g., wheelchair or bedside chair)?: A Little Help needed to walk in hospital room?: A Little Help needed climbing 3-5 steps with a railing? : A Lot 6 Click Score: 17    End of Session Equipment Utilized During Treatment: Gait belt;Oxygen Activity Tolerance: Patient tolerated treatment well Patient left: in chair;with chair alarm set;with call bell/phone within  reach;with family/visitor present Nurse Communication: Mobility status PT Visit Diagnosis: Unsteadiness on feet (R26.81)     Time: 6578-4696 PT Time Calculation (min) (ACUTE ONLY): 16 min  Charges:    $Therapeutic Activity: 8-22 mins PT General Charges $$ ACUTE PT VISIT: 1 Visit                    Harrel Carina, DPT, CLT  Acute Rehabilitation Services Office: [phone] (Secure chat preferred)    Claudia Desanctis 10/15/2023, 1:22 PM

## 2023-10-15 NOTE — Plan of Care (Signed)
  Problem: Education: Goal: Ability to describe self-care measures that may prevent or decrease complications (Diabetes Survival Skills Education) will improve Outcome: Progressing Goal: Individualized Educational Video(s) Outcome: Progressing   Problem: Coping: Goal: Ability to adjust to condition or change in health will improve Outcome: Progressing   Problem: Fluid Volume: Goal: Ability to maintain a balanced intake and output will improve Outcome: Progressing   Problem: Health Behavior/Discharge Planning: Goal: Ability to identify and utilize available resources and services will improve Outcome: Progressing Goal: Ability to manage health-related needs will improve Outcome: Progressing   Problem: Metabolic: Goal: Ability to maintain appropriate glucose levels will improve Outcome: Progressing   Problem: Nutritional: Goal: Maintenance of adequate nutrition will improve Outcome: Progressing Goal: Progress toward achieving an optimal weight will improve Outcome: Progressing   Problem: Skin Integrity: Goal: Risk for impaired skin integrity will decrease Outcome: Progressing   Problem: Tissue Perfusion: Goal: Adequacy of tissue perfusion will improve Outcome: Progressing   Problem: Education: Goal: Knowledge of General Education information will improve Description: Including pain rating scale, medication(s)/side effects and non-pharmacologic comfort measures Outcome: Progressing   Problem: Health Behavior/Discharge Planning: Goal: Ability to manage health-related needs will improve Outcome: Progressing   Problem: Clinical Measurements: Goal: Will remain free from infection Outcome: Progressing   Problem: Clinical Measurements: Goal: Diagnostic test results will improve Outcome: Progressing   Problem: Clinical Measurements: Goal: Cardiovascular complication will be avoided Outcome: Progressing   Problem: Activity: Goal: Risk for activity intolerance will  decrease Outcome: Progressing   Problem: Nutrition: Goal: Adequate nutrition will be maintained Outcome: Progressing   Problem: Elimination: Goal: Will not experience complications related to bowel motility Outcome: Progressing   Problem: Pain Managment: Goal: General experience of comfort will improve and/or be controlled Outcome: Progressing   Problem: Safety: Goal: Ability to remain free from injury will improve Outcome: Progressing   Problem: Skin Integrity: Goal: Risk for impaired skin integrity will decrease Outcome: Progressing

## 2023-10-15 NOTE — Progress Notes (Signed)
 SLP Cancellation Note  Patient Details Name: Joshua [name] MRN: [id number] DOB: 01/20/1936   Cancelled treatment:       Reason Eval/Treat Not Completed: Patient not medically ready MBS cancelled today due to patient still being on HHFNC. SLP will follow for readiness.  Angela Nevin, MA, CCC-SLP Speech Therapy

## 2023-10-15 NOTE — Hospital Course (Signed)
 88 year old male with chronic respiratory failure with hypoxia on 2-3L O2 at home, emphysema, pulmonary hypertension, chronic PE, CKD, aspiration pneumonia, chronic hypotension, OSA presented with worsening shortness of breath. On presentation, he was hypoxic and required BiPAP for oxygen saturations of 70s. He was febrile with leukocytosis and chest x-ray suggestive of possible pneumonia. He was started on broad-spectrum antibiotics. Because of persistent hypotension, he was started on pressors and admitted to ICU under PCCM service. Subsequently, vasopressors have been weaned off with improving blood pressure. Care has been transferred to [facility] service from 10/12/2023 onwards.

## 2023-10-16 DIAGNOSIS — I959 Hypotension, unspecified: Secondary | ICD-10-CM | POA: Diagnosis not present

## 2023-10-16 DIAGNOSIS — J9621 Acute and chronic respiratory failure with hypoxia: Secondary | ICD-10-CM | POA: Diagnosis not present

## 2023-10-16 DIAGNOSIS — A419 Sepsis, unspecified organism: Secondary | ICD-10-CM | POA: Diagnosis not present

## 2023-10-16 DIAGNOSIS — J189 Pneumonia, unspecified organism: Secondary | ICD-10-CM | POA: Diagnosis not present

## 2023-10-16 MED ORDER — FUROSEMIDE 10 MG/ML IJ SOLN
40.0000 mg | Freq: Once | INTRAMUSCULAR | Status: AC
  Administered 2023-10-16: 40 mg via INTRAVENOUS
  Filled 2023-10-16: qty 4

## 2023-10-16 NOTE — Progress Notes (Signed)
 Speech Language Pathology Treatment: Dysphagia  Patient Details Name: Joshua [name] MRN: [id number] DOB: April 12, 1936 Today's Date: 10/16/2023 Time: 1010-1020 SLP Time Calculation (min) (ACUTE ONLY): 10 min  Assessment / Plan / Recommendation Clinical Impression  Respiratory issues have prevented trip to radiology for MBS. Pt is pleasant, interactive. He is eating/drinking without any obvious s/s of aspiration - none per nursing report nor during my session with him this morning. He reports no difficulty and is eating/drinking independently. BS are diminished but stable.  It would be ideal to complete an MBS to determine if microaspiration is playing a role in respiratory function. (MBS would allow esophageal sweep to assess potential contribution). We will follow along and await an opportunity.  D/W RN.    HPI HPI: 88 year old male presented 2/21 with worsening shortness of breath. Dx septic shock, bilateral pna, acute on chronic resp failure. PMHx: chronic respiratory failure with hypoxia, emphysema, pulmonary hypertension, chronic PE, CKD, recurring pneumonia, chronic hypotension, OSA, HF. Hx of GERD and large HH.   Pulm notes mention concern for aspiration related to GERD and recs for swallow study. No hx of MBS or esophagram. He has received recs for [facility] elevation during sleep, small portions, avoiding eating two hours before bedtime.      SLP Plan  Continue with current plan of care      Recommendations for follow up therapy are one component of a multi-disciplinary discharge planning process, led by the attending physician.  Recommendations may be updated based on patient status, additional functional criteria and insurance authorization.    Recommendations  Diet recommendations: Regular;Thin liquid Liquids provided via: Cup;Straw Medication Administration: Whole meds with puree Supervision: Patient able to self feed Postural Changes and/or Swallow Maneuvers: Seated upright  90 degrees                  Oral care BID     Dysphagia, unspecified (R13.10)     Continue with current plan of care    Joshua Erisman L. Samson Frederic, MA CCC/SLP Clinical Specialist - Acute Care SLP Acute Rehabilitation Services Office number [phone]  Joshua [name]  10/16/2023, 10:29 AM

## 2023-10-16 NOTE — Progress Notes (Signed)
 Mobility Specialist Progress Note:   10/16/23 1600  Mobility  Activity Transferred from chair to bed  Level of Assistance Contact guard assist, steadying assist  Assistive Device None  Distance Ambulated (ft) 5 ft  Activity Response Tolerated well  Mobility Referral Yes  Mobility visit 1 Mobility  Mobility Specialist Start Time (ACUTE ONLY) 1440  Mobility Specialist Stop Time (ACUTE ONLY) 1456  Mobility Specialist Time Calculation (min) (ACUTE ONLY) 16 min   Pt received in bed requesting to get back to bed. No physical assistance needed. No c/o throughout. Situated back in bed w/ call bell and personal belongings in reach. All needs met.  Thompson Grayer Mobility Specialist  Please contact vis Secure Chat or  Rehab Office [phone]

## 2023-10-16 NOTE — Progress Notes (Signed)
 Progress Note    Joshua [name]   ZOX:[id number]  DOB: 10-31-1935  DOA: 10/10/2023     6 PCP: [name], DO  Initial CC: SOB  Hospital Course: 88 year old male with chronic respiratory failure with hypoxia on 2-3L O2 at home, emphysema, pulmonary hypertension, chronic PE, CKD, aspiration pneumonia, chronic hypotension, OSA presented with worsening shortness of breath. On presentation, he was hypoxic and required BiPAP for oxygen saturations of 70s. He was febrile with leukocytosis and chest x-ray suggestive of possible pneumonia. He was started on broad-spectrum antibiotics. Because of persistent hypotension, he was started on pressors and admitted to ICU under PCCM service. Subsequently, vasopressors have been weaned off with improving blood pressure. Care has been transferred to [facility] service from 10/12/2023 onwards.   Interval History:  No events overnight.  Resting comfortably in bed.  Family present bedside. Still remains on high flow. Discussed LTAC with wife bedside this morning and they were amenable.   Assessment and Plan:  Septic shock: Present on admission Bilateral pneumonia with possibility of aspiration pneumonia -Presented with fever, leukocytosis with possible pneumonia and was persistently hypotensive requiring pressors and admission to ICU under PCCM service. -Cultures have been negative so far.  Respiratory panel PCR negative.  Influenza/COVID/RSV PCR negative.  Off pressors.  Blood pressure has mostly improved and currently intermittently on the high side -Care has been transferred to [facility] service from 10/12/2023 onwards. -Antibiotics have been switched to IV Unasyn. Course completed 2/27 -Diet as per SLP recommendations   Acute on chronic hypoxic respiratory failure COPD/emphysema -Required BiPAP on presentation.   - on 2-3L chronic O2 at home - continue solumedrol - wean O2 as able - appears slow to wean; agree with LTAC referral    Chronic  hypotension -Currently on midodrine.  Blood pressure intermittently on the higher side. Might have to decrease dose or discontinue if BP continues to remain elevated.   Chronic diastolic heart failure Pulmonary hypertension -Echo showed EF of 65 to 70% with grade 1 diastolic dysfunction.  Continue strict input and output.  Daily weights.  Fluid restriction. -Outpatient follow-up with cardiology   Chronic PE -Continue Eliquis   AKI on CKD stage IIIa - baseline around 1.5 - 1.6; GFR ~ 40 -Creatinine peaked at 1.94 on admission -Now back to baseline   Leukocytosis -Resolved   Hypomagnesemia -Replete as needed   Lactic acidosis -Resolved   Thrombocytopenia -Resolved   Physical deconditioning -Family requesting SNF placement.  TOC consulted.   Goals of care -Overall prognosis is guarded given respiratory status.  -Hold off on palliative care consult for now   Old records reviewed in assessment of this patient  Antimicrobials: Unasyn 2/22 >> 2/27  DVT prophylaxis:   apixaban (ELIQUIS) tablet 5 mg   Code Status:   Code Status: Do not attempt resuscitation (DNR) PRE-ARREST INTERVENTIONS DESIRED  Mobility Assessment (Last 72 Hours)     Mobility Assessment     Row Name 10/16/23 1100 10/16/23 0731 10/15/23 1939 10/15/23 1319 10/15/23 0800   Does patient have an order for bedrest or is patient medically unstable -- No - Continue assessment No - Continue assessment -- No - Continue assessment   What is the highest level of mobility based on the progressive mobility assessment? Level 3 (Stands with assist) - Balance while standing  and cannot march in place Level 4 (Walks with assist in room) - Balance while marching in place and cannot step forward and back - Complete Level 4 (Walks with assist in  room) - Balance while marching in place and cannot step forward and back - Complete Level 4 (Walks with assist in room) - Balance while marching in place and cannot step forward and  back - Complete Level 4 (Walks with assist in room) - Balance while marching in place and cannot step forward and back - Complete   Is the above level different from baseline mobility prior to current illness? -- Yes - Recommend PT order Yes - Recommend PT order -- Yes - Recommend PT order    Row Name 10/14/23 2028 10/14/23 0800 10/13/23 2032       Does patient have an order for bedrest or is patient medically unstable No - Continue assessment No - Continue assessment No - Continue assessment     What is the highest level of mobility based on the progressive mobility assessment? Level 2 (Chairfast) - Balance while sitting on edge of bed and cannot stand Level 2 (Chairfast) - Balance while sitting on edge of bed and cannot stand Level 2 (Chairfast) - Balance while sitting on edge of bed and cannot stand     Is the above level different from baseline mobility prior to current illness? Yes - Recommend PT order Yes - Recommend PT order Yes - Recommend PT order              Barriers to discharge:  Disposition Plan:  SNF HH orders placed: n/a Status is: Inpt  Objective: Blood pressure 123/72, pulse 76, temperature 97.8 F (36.6 C), temperature source Oral, resp. rate (!) 22, height 6\' 3"  (1.905 m), weight 84.5 kg, SpO2 94%.  Examination:  Physical Exam Constitutional:      General: He is not in acute distress.    Appearance: Normal appearance.  HENT:     Head: Normocephalic and atraumatic.     Mouth/Throat:     Mouth: Mucous membranes are moist.  Eyes:     Extraocular Movements: Extraocular movements intact.  Cardiovascular:     Rate and Rhythm: Normal rate and regular rhythm.  Pulmonary:     Effort: Pulmonary effort is normal. No respiratory distress.     Breath sounds: Normal breath sounds. No wheezing.  Abdominal:     General: Bowel sounds are normal. There is no distension.     Palpations: Abdomen is soft.     Tenderness: There is no abdominal tenderness.  Musculoskeletal:         General: Normal range of motion.     Cervical back: Normal range of motion and neck supple.  Skin:    General: Skin is warm and dry.  Neurological:     General: No focal deficit present.     Mental Status: He is alert.  Psychiatric:        Mood and Affect: Mood normal.        Behavior: Behavior normal.      Consultants:    Procedures:    Data Reviewed: No results found for this or any previous visit (from the past 24 hours).   I have reviewed pertinent nursing notes, vitals, labs, and images as necessary. I have ordered labwork to follow up on as indicated.  I have reviewed the last notes from staff over past 24 hours. I have discussed patient's care plan and test results with nursing staff, CM/SW, and other staff as appropriate.  Time spent: Greater than 50% of the 55 minute visit was spent in counseling/coordination of care for the patient as laid out in the  A&P.   LOS: 6 days   Lewie Chamber, MD [facility] 10/16/2023, 4:13 PM

## 2023-10-16 NOTE — TOC Progression Note (Addendum)
 Transition of Care [facility]) - Progression Note    Patient Details  Name: Joshua [name] MRN: [id number] Date of Birth: [birth year]  [facility] CM/SW Contact  Harriet Masson, RN Phone Number: 10/16/2023, 11:31 AM  Clinical Narrative:    Spoke to patient's wife, Randa Evens regarding LTAC choice.Randa Evens chose [facility]. Referral sent to [facility] with [facility].   [address] with [facility] accepted referral for admission tomorrow 10/17/23  Expected Discharge Plan: Long Term Acute Care (LTAC) Barriers to Discharge: Continued Medical Work up  Expected Discharge Plan and Services   Discharge Planning Services: CM Consult Post Acute Care Choice: Skilled Nursing Facility Living arrangements for the past 2 months: Single Family Home                                       Social Determinants of Health (SDOH) Interventions SDOH Screenings   Food Insecurity: No Food Insecurity (10/10/2023)  Housing: Low Risk  (10/12/2023)  Transportation Needs: Unmet Transportation Needs (10/10/2023)  Utilities: Not At Risk (10/10/2023)  Depression (PHQ2-9): Low Risk  (03/13/2022)  Social Connections: Socially Integrated (10/10/2023)  Stress: No Stress Concern Present (05/09/2021)  Tobacco Use: Medium Risk (10/10/2023)    Readmission Risk Interventions    10/05/2021    3:15 PM  Readmission Risk Prevention Plan  Transportation Screening Complete  Medication Review (RN Care Manager) Complete  PCP or Specialist appointment within 3-5 days of discharge Complete  HRI or Home Care Consult Complete  SW Recovery Care/Counseling Consult Complete  Palliative Care Screening Not Applicable  Skilled Nursing Facility Not Applicable

## 2023-10-16 NOTE — Progress Notes (Signed)
 Occupational Therapy Treatment Patient Details Name: Joshua [name] MRN: [id number] DOB: 1935-12-29 Today's Date: 10/16/2023   History of present illness 88 year old male who presented with worsening shortness of breath PMH: chronic respiratory failure with hypoxia, emphysema, pulmonary hypertension, chronic PE, CKD, aspiration pneumonia, chronic hypotension, OSA   OT comments  Pt easily awakened for therapy. Came to EOB with supervision for lines. Min assist for front opening gown. Transferred to chair without AD with CGA. Completed seated oral care and ate lunch with set up. Pt remains on HHFNC with difficulty weaning, [name]      If plan is discharge home, recommend the following:  A little help with walking and/or transfers;A little help with bathing/dressing/bathroom;Assistance with cooking/housework;Assist for transportation;Help with stairs or ramp for entrance   Equipment Recommendations  None recommended by OT    Recommendations for Other Services      Precautions / Restrictions Precautions Precautions: Fall Precaution/Restrictions Comments: watch SpO2, pt on HHFNC Restrictions Weight Bearing Restrictions Per Provider Order: No       Mobility Bed Mobility Overal bed mobility: Needs Assistance Bed Mobility: Supine to Sit     Supine to sit: Supervision     General bed mobility comments: supervision for lines    Transfers Overall transfer level: Needs assistance Equipment used: None Transfers: Sit to/from Stand, Bed to chair/wheelchair/BSC Sit to Stand: Contact guard assist     Step pivot transfers: Contact guard assist           Balance Overall balance assessment: Needs assistance Sitting-balance support: Feet supported, No upper extremity supported Sitting balance-Leahy Scale: Fair       Standing balance-Leahy Scale: Fair                             ADL either performed or assessed with clinical judgement   ADL Overall  ADL's : Needs assistance/impaired                 Upper Body Dressing : Set up;Sitting Upper Body Dressing Details (indicate cue type and reason): front opening gown                        Extremity/Trunk Assessment              Vision       Perception     Praxis     Communication Communication Communication: No apparent difficulties   Cognition Arousal: Alert Behavior During Therapy: WFL for tasks assessed/performed Cognition: No apparent impairments             OT - Cognition Comments: RN reports forgetfulness                 Following commands: Intact        Cueing      Exercises      Shoulder Instructions       General Comments      Pertinent Vitals/ Pain       Pain Assessment Pain Assessment: No/denies pain  Home Living                                          Prior Functioning/Environment              Frequency  Min 1X/week  Progress Toward Goals  OT Goals(current goals can now be found in the care plan section)  Progress towards OT goals: Progressing toward goals  Acute Rehab OT Goals OT Goal Formulation: With patient Time For Goal Achievement: 10/27/23 Potential to Achieve Goals: Good  Plan      Co-evaluation                 AM-PAC OT "6 Clicks" Daily Activity     Outcome Measure   Help from another person eating meals?: None Help from another person taking care of personal grooming?: A Little Help from another person toileting, which includes using toliet, bedpan, or urinal?: A Little Help from another person bathing (including washing, rinsing, drying)?: A Little Help from another person to put on and taking off regular upper body clothing?: A Little Help from another person to put on and taking off regular lower body clothing?: A Lot 6 Click Score: 18    End of Session Equipment Utilized During Treatment: Oxygen (HHFNC)  OT Visit Diagnosis: Unsteadiness on  feet (R26.81);Muscle weakness (generalized) (M62.81);Other (comment) (decreased activity tolerance)   Activity Tolerance Patient tolerated treatment well   Patient Left in chair;with call bell/phone within reach   Nurse Communication          Time: 1050-1130 OT Time Calculation (min): 40 min  Charges: OT General Charges $OT Visit: 1 Visit OT Treatments $Self Care/Home Management : 38-52 mins  Berna Spare, OTR/L Acute Rehabilitation Services Office: [phone]   Evern Bio 10/16/2023, 11:55 AM

## 2023-10-17 DIAGNOSIS — A419 Sepsis, unspecified organism: Secondary | ICD-10-CM | POA: Diagnosis not present

## 2023-10-17 DIAGNOSIS — J189 Pneumonia, unspecified organism: Secondary | ICD-10-CM | POA: Diagnosis not present

## 2023-10-17 DIAGNOSIS — J9621 Acute and chronic respiratory failure with hypoxia: Secondary | ICD-10-CM | POA: Diagnosis not present

## 2023-10-17 DIAGNOSIS — R6521 Severe sepsis with septic shock: Secondary | ICD-10-CM | POA: Diagnosis not present

## 2023-10-17 MED ORDER — PREDNISONE 20 MG PO TABS
40.0000 mg | ORAL_TABLET | Freq: Every day | ORAL | Status: DC
Start: 2023-10-17 — End: 2023-10-20

## 2023-10-17 NOTE — Plan of Care (Signed)
  Problem: Clinical Measurements: Goal: Will remain free from infection Outcome: Progressing Goal: Diagnostic test results will improve Outcome: Progressing Goal: Cardiovascular complication will be avoided Outcome: Progressing   Problem: Activity: Goal: Risk for activity intolerance will decrease Outcome: Progressing   Problem: Nutrition: Goal: Adequate nutrition will be maintained Outcome: Progressing   Problem: Coping: Goal: Level of anxiety will decrease Outcome: Progressing   Problem: Pain Managment: Goal: General experience of comfort will improve and/or be controlled Outcome: Progressing   Problem: Safety: Goal: Ability to remain free from injury will improve Outcome: Progressing

## 2023-10-17 NOTE — Progress Notes (Signed)
 Mobility Specialist Progress Note;   10/17/23 1015  Mobility  Activity Transferred from bed to chair  Level of Assistance Contact guard assist, steadying assist  Assistive Device None  Distance Ambulated (ft) 3 ft  Activity Response Tolerated well  Mobility Referral Yes  Mobility visit 1 Mobility  Mobility Specialist Start Time (ACUTE ONLY) 1015  Mobility Specialist Stop Time (ACUTE ONLY) 1029  Mobility Specialist Time Calculation (min) (ACUTE ONLY) 14 min   Pt agreeable to mobility. Required MinG assistance to safely transfer pt from bed to chair. VSS throughout and no c/o when asked. Pt left in bed with all needs met, alarm on.   Caesar Bookman Mobility Specialist Please contact via SecureChat or [employer] [phone]

## 2023-10-17 NOTE — Discharge Summary (Signed)
 Physician Discharge Summary   Joshua [name] MVH:[id number] DOB: 10/23/1935 DOA: 10/10/2023  PCP: [name], DO  Admit date: 10/10/2023 Discharge date:  10/17/2023  Admitted From: Home Disposition:  [facility] Discharging physician: Lewie Chamber, MD Barriers to discharge:   Recommendations at discharge: Continue weaning O2; home setting 2-3L   Discharge Condition: stable CODE STATUS: DNR Diet recommendation:  Diet Orders (From admission, onward)     Start     Ordered   10/17/23 0000  Diet - low sodium heart healthy        10/17/23 0933   10/14/23 0820  Diet Heart Room service appropriate? Yes; Fluid consistency: Thin; Fluid restriction: 1500 mL Fluid  Diet effective now       Question Answer Comment  Room service appropriate? Yes   Fluid consistency: Thin   Fluid restriction: 1500 mL Fluid      10/14/23 0819            Hospital Course: 88 year old male with chronic respiratory failure with hypoxia on 2-3L O2 at home, emphysema, pulmonary hypertension, chronic PE, CKD, aspiration pneumonia, chronic hypotension, OSA presented with worsening shortness of breath. On presentation, he was hypoxic and required BiPAP for oxygen saturations of 70s. He was febrile with leukocytosis and chest x-ray suggestive of possible pneumonia. He was started on broad-spectrum antibiotics. Because of persistent hypotension, he was started on pressors and admitted to ICU under PCCM service. Subsequently, vasopressors have been weaned off with improving blood pressure. Care has been transferred to [facility] service from 10/12/2023 onwards.   Assessment and Plan:  Septic shock: Present on admission Bilateral pneumonia with possibility of aspiration pneumonia -Presented with fever, leukocytosis with possible pneumonia and was persistently hypotensive requiring pressors and admission to ICU under PCCM service. -Cultures have been negative so far.  Respiratory panel PCR negative.  Influenza/COVID/RSV  PCR negative.  Off pressors.  Blood pressure has mostly improved and currently intermittently on the high side -Care has been transferred to [facility] service from 10/12/2023 onwards. -Antibiotics have been switched to IV Unasyn. Course completed 2/27 -Diet as per SLP recommendations   Acute on chronic hypoxic respiratory failure COPD/emphysema -Required BiPAP on presentation.   - on 2-3L chronic O2 at home - s/p solumedrol; continue prednisone until complete - wean O2 as able - appears slow to wean; agree with LTAC referral    Chronic hypotension -Currently on midodrine.  Blood pressure intermittently on the higher side. Might have to decrease dose or discontinue if BP continues to remain elevated.   Chronic diastolic heart failure Pulmonary hypertension -Echo showed EF of 65 to 70% with grade 1 diastolic dysfunction.  Continue strict input and output.  Daily weights.  Fluid restriction. -Outpatient follow-up with cardiology   Chronic PE -Continue Eliquis   AKI on CKD stage IIIa - baseline around 1.5 - 1.6; GFR ~ 40 -Creatinine peaked at 1.94 on admission -Now back to baseline   Leukocytosis -Resolved   Hypomagnesemia -Repleted   Lactic acidosis -Resolved   Thrombocytopenia -Resolved   Physical deconditioning -Family requesting SNF placement.  TOC consulted.   Goals of care -Overall prognosis is guarded given respiratory status.  -Hold off on palliative care consult for now   Principal Diagnosis: Septic shock [facility])  Discharge Diagnoses: Active Hospital Problems   Diagnosis Date Noted   Acute on chronic respiratory failure with hypoxia (HCC) 10/10/2023    Priority: 1.   Community acquired bilateral lower lobe pneumonia 10/10/2023    Priority: 2.   Hypotension  05/20/2022    Priority: 2.   DMII (diabetes mellitus, type 2) (HCC) 10/15/2023    Priority: 4.   Stage 3b chronic kidney disease (CKD) (HCC) - baseline SCr 1.5-1.8 05/20/2022    Priority: 4.   DNR (do not  resuscitate) discussion 10/10/2023   Chronic diastolic CHF (congestive heart failure) (HCC)    Gastroesophageal reflux disease without esophagitis 10/05/2021   Benign prostatic hyperplasia with lower urinary tract symptoms 08/25/2017    Resolved Hospital Problems   Diagnosis Date Noted Date Resolved   Septic shock [facility]) 10/10/2023 10/15/2023    Priority: 3.     Discharge Instructions     Diet - low sodium heart healthy   Complete by: As directed    Increase activity slowly   Complete by: As directed       Allergies as of 10/17/2023       Reactions   Other Swelling   Beer- Swelling    Sunflower Oil Swelling   Sulfa Antibiotics Rash        Medication List     STOP taking these medications    potassium chloride 10 MEQ tablet Commonly known as: KLOR-CON       TAKE these medications    Adempas 2 MG Tabs Generic drug: Riociguat TAKE 1 TABLET BY MOUTH 3 TIMES A DAY AS DIRECTED. DO NOT HANDLE IF PREGNANT   albuterol 108 (90 Base) MCG/ACT inhaler Commonly known as: VENTOLIN HFA Inhale 1 puff into the lungs every 4 (four) hours as needed for shortness of breath.   allopurinol 100 MG tablet Commonly known as: ZYLOPRIM Take 100 mg by mouth every morning.   atorvastatin 40 MG tablet Commonly known as: LIPITOR TAKE 1 TABLET BY MOUTH EVERY DAY   azelastine 0.1 % nasal spray Commonly known as: ASTELIN Place 2 sprays into both nostrils 2 (two) times daily as needed for rhinitis. Use in each nostril as directed   Cinnamon 500 MG capsule Take 500 mg by mouth every morning.   dapagliflozin propanediol 10 MG Tabs tablet Commonly known as: Farxiga Take 1 tablet (10 mg total) by mouth daily before breakfast.   Eliquis 5 MG Tabs tablet Generic drug: apixaban TAKE 1 TABLET BY MOUTH TWICE A DAY   finasteride 5 MG tablet Commonly known as: PROSCAR Take 5 mg by mouth at bedtime.   gabapentin 300 MG capsule Commonly known as: NEURONTIN Take 300 mg by mouth 3 (three)  times daily.   magnesium oxide 400 (240 Mg) MG tablet Commonly known as: MAG-OX TAKE 1 TABLET BY MOUTH EVERY DAY   metoCLOPramide 5 MG tablet Commonly known as: REGLAN Take 5 mg by mouth 3 (three) times daily before meals.   midodrine 5 MG tablet Commonly known as: PROAMATINE TAKE 3 TABLETS BY MOUTH 3 TIMES DAILY. What changed: See the new instructions.   multivitamin with minerals Tabs tablet Take 1 tablet by mouth daily.   OXYGEN Inhale 2 L into the lungs continuous.   pantoprazole 40 MG tablet Commonly known as: PROTONIX Take 1 tablet (40 mg total) by mouth every morning.   predniSONE 20 MG tablet Commonly known as: DELTASONE Take 2 tablets (40 mg total) by mouth daily with breakfast for 5 days.   Stiolto Respimat 2.5-2.5 MCG/ACT Aers Generic drug: Tiotropium Bromide-Olodaterol Inhale 2 puffs into the lungs daily. What changed: how much to take   torsemide 20 MG tablet Commonly known as: DEMADEX Take 20 mg by mouth daily.  Allergies  Allergen Reactions   Other Swelling    Beer- Swelling    Sunflower Oil Swelling   Sulfa Antibiotics Rash    Discharge Exam: BP 116/63 (BP Location: Right Arm)   Pulse 67   Temp 98.1 F (36.7 C) (Oral)   Resp 19   Ht 6\' 3"  (1.905 m)   Wt 84.8 kg   SpO2 94%   BMI 23.37 kg/m  Physical Exam Constitutional:      General: He is not in acute distress.    Appearance: Normal appearance.  HENT:     Head: Normocephalic and atraumatic.     Mouth/Throat:     Mouth: Mucous membranes are moist.  Eyes:     Extraocular Movements: Extraocular movements intact.  Cardiovascular:     Rate and Rhythm: Normal rate and regular rhythm.  Pulmonary:     Effort: Pulmonary effort is normal. No respiratory distress.     Breath sounds: Normal breath sounds. No wheezing.  Abdominal:     General: Bowel sounds are normal. There is no distension.     Palpations: Abdomen is soft.     Tenderness: There is no abdominal tenderness.   Musculoskeletal:        General: Normal range of motion.     Cervical back: Normal range of motion and neck supple.  Skin:    General: Skin is warm and dry.  Neurological:     General: No focal deficit present.     Mental Status: He is alert.  Psychiatric:        Mood and Affect: Mood normal.        Behavior: Behavior normal.      The results of significant diagnostics from this hospitalization (including imaging, microbiology, ancillary and laboratory) are listed below for reference.   Microbiology: Recent Results (from the past 240 hours)  Resp panel by RT-PCR (RSV, Flu A&B, Covid) Anterior Nasal Swab     Status: None   Collection Time: 10/10/23  9:17 AM   Specimen: Anterior Nasal Swab  Result Value Ref Range Status   SARS Coronavirus 2 by RT PCR NEGATIVE NEGATIVE Final   Influenza A by PCR NEGATIVE NEGATIVE Final   Influenza B by PCR NEGATIVE NEGATIVE Final    Comment: (NOTE) The Xpert Xpress SARS-CoV-2/FLU/RSV plus assay is intended as an aid in the diagnosis of influenza from Nasopharyngeal swab specimens and should not be used as a sole basis for treatment. Nasal washings and aspirates are unacceptable for Xpert Xpress SARS-CoV-2/FLU/RSV testing.  Fact Sheet for Patients: BloggerCourse.com  Fact Sheet for Healthcare Providers: SeriousBroker.it  This test is not yet approved or cleared by the [place] FDA and has been authorized for detection and/or diagnosis of SARS-CoV-2 by FDA under an Emergency Use Authorization (EUA). This EUA will remain in effect (meaning this test can be used) for the duration of the COVID-19 declaration under Section 564(b)(1) of the Act, 21 U.S.C. section 360bbb-3(b)(1), unless the authorization is terminated or revoked.     Resp Syncytial Virus by PCR NEGATIVE NEGATIVE Final    Comment: (NOTE) Fact Sheet for Patients: BloggerCourse.com  Fact Sheet for  Healthcare Providers: SeriousBroker.it  This test is not yet approved or cleared by the [place] FDA and has been authorized for detection and/or diagnosis of SARS-CoV-2 by FDA under an Emergency Use Authorization (EUA). This EUA will remain in effect (meaning this test can be used) for the duration of the COVID-19 declaration under Section 564(b)(1) of the Act,  21 U.S.C. section 360bbb-3(b)(1), unless the authorization is terminated or revoked.  Performed at [facility], [address], [place], [place] [address]   Culture, blood (routine x 2)     Status: None   Collection Time: 10/10/23  9:25 AM   Specimen: BLOOD RIGHT ARM  Result Value Ref Range Status   Specimen Description BLOOD RIGHT ARM  Final   Special Requests   Final    BOTTLES DRAWN AEROBIC AND ANAEROBIC Blood Culture adequate volume   Culture   Final    NO GROWTH 5 DAYS Performed at [facility], [address] [place], [place] [address]    Report Status 10/15/2023 FINAL  Final  Culture, blood (routine x 2)     Status: None   Collection Time: 10/10/23  9:30 AM   Specimen: BLOOD RIGHT ARM  Result Value Ref Range Status   Specimen Description BLOOD RIGHT ARM  Final   Special Requests   Final    BOTTLES DRAWN AEROBIC AND ANAEROBIC Blood Culture adequate volume   Culture   Final    NO GROWTH 5 DAYS Performed at [facility], [address], [place], [place] [address]    Report Status 10/15/2023 FINAL  Final  Respiratory (~20 pathogens) panel by PCR     Status: None   Collection Time: 10/10/23  1:27 PM   Specimen: Nasopharyngeal Swab; Respiratory  Result Value Ref Range Status   Adenovirus NOT DETECTED NOT DETECTED Final   Coronavirus 229E NOT DETECTED NOT DETECTED Final    Comment: (NOTE) The Coronavirus on the Respiratory Panel, DOES NOT test for the novel  Coronavirus (2019 nCoV)    Coronavirus HKU1 NOT DETECTED NOT DETECTED Final   Coronavirus NL63 NOT  DETECTED NOT DETECTED Final   Coronavirus OC43 NOT DETECTED NOT DETECTED Final   Metapneumovirus NOT DETECTED NOT DETECTED Final   Rhinovirus / Enterovirus NOT DETECTED NOT DETECTED Final   Influenza A NOT DETECTED NOT DETECTED Final   Influenza B NOT DETECTED NOT DETECTED Final   Parainfluenza Virus 1 NOT DETECTED NOT DETECTED Final   Parainfluenza Virus 2 NOT DETECTED NOT DETECTED Final   Parainfluenza Virus 3 NOT DETECTED NOT DETECTED Final   Parainfluenza Virus 4 NOT DETECTED NOT DETECTED Final   Respiratory Syncytial Virus NOT DETECTED NOT DETECTED Final   Bordetella pertussis NOT DETECTED NOT DETECTED Final   Bordetella Parapertussis NOT DETECTED NOT DETECTED Final   Chlamydophila pneumoniae NOT DETECTED NOT DETECTED Final   Mycoplasma pneumoniae NOT DETECTED NOT DETECTED Final    Comment: Performed at [facility], [address], [place], [place] [address]  MRSA Next Gen by PCR, Nasal     Status: None   Collection Time: 10/10/23  2:45 PM   Specimen: Nasal Mucosa; Nasal Swab  Result Value Ref Range Status   MRSA by PCR Next Gen NOT DETECTED NOT DETECTED Final    Comment: (NOTE) The GeneXpert MRSA Assay (FDA approved for NASAL specimens only), is one component of a comprehensive MRSA colonization surveillance program. It is not intended to diagnose MRSA infection nor to guide or monitor treatment for MRSA infections. Test performance is not FDA approved in patients less than 31 years old. Performed at [facility], [address], [place], [place] [address]   Expectorated Sputum Assessment w Gram Stain, Rflx to Resp Cult     Status: None   Collection Time: 10/10/23 11:25 PM   Specimen: Expectorated Sputum  Result Value Ref Range Status   Specimen Description EXPECTORATED SPUTUM  Final   Special Requests NONE  Final   Sputum evaluation   Final    THIS SPECIMEN IS ACCEPTABLE FOR SPUTUM CULTURE Performed at [facility], [address], [place], [place]  [address]    Report Status 10/11/2023 FINAL  Final  Culture, Respiratory w Gram Stain     Status: None   Collection Time: 10/10/23 11:25 PM  Result Value Ref Range Status   Specimen Description EXPECTORATED SPUTUM  Final   Special Requests NONE Reflexed from [id number]  Final   Gram Stain   Final    FEW WBC PRESENT, PREDOMINANTLY PMN NO ORGANISMS SEEN    Culture   Final    FEW Normal respiratory flora-no Staph aureus or Pseudomonas seen Performed at [facility], [address], [place], [place] [address]    Report Status 10/13/2023 FINAL  Final     Labs: BNP (last 3 results) Recent Labs    03/13/23 1116 07/04/23 0922 10/10/23 0957  BNP 173.5* 149.9* 354.9*   Basic Metabolic Panel: Recent Labs  Lab 10/10/23 0935 10/11/23 0842 10/13/23 0226 10/14/23 0158 10/15/23 0249  NA 142 137 138 140 141  K 4.4 4.1 3.8 3.8 4.1  CL 97* 99 101 103 103  CO2 29 29 28 27 30   GLUCOSE 120* 112* 116* 100* 142*  BUN 35* 28* 19 20 28*  CREATININE 1.94* 1.48* 0.98 1.18 1.21  CALCIUM 9.9 9.6 10.0 10.2 10.9*  MG  --   --  1.6* 1.8 1.8   Liver Function Tests: Recent Labs  Lab 10/10/23 0935 10/13/23 0226  AST 37 35  ALT 14 19  ALKPHOS 78 62  BILITOT 1.4* 1.1  PROT 7.3 6.2*  ALBUMIN 3.7 2.7*   No results for input(s): "LIPASE", "AMYLASE" in the last 168 hours. No results for input(s): "AMMONIA" in the last 168 hours. CBC: Recent Labs  Lab 10/10/23 1234 10/11/23 0842 10/13/23 0226 10/14/23 0158 10/15/23 0249  WBC 15.4* 16.7* 10.1 7.3 9.4  NEUTROABS 14.3*  --  8.4* 5.6 8.1*  HGB 13.0 12.4* 12.8* 12.8* 13.9  HCT 40.7 39.1 40.2 39.5 42.3  MCV 85.9 85.6 84.5 83.7 82.9  PLT 137* 126* 144* 162 196   Cardiac Enzymes: No results for input(s): "CKTOTAL", "CKMB", "CKMBINDEX", "TROPONINI" in the last 168 hours. BNP: Invalid input(s): "POCBNP" CBG: Recent Labs  Lab 10/10/23 1516 10/10/23 2122 10/11/23 0830 10/11/23 2114  GLUCAP 105* 169* 106* 137*   D-Dimer No results for  input(s): "DDIMER" in the last 72 hours. Hgb A1c No results for input(s): "HGBA1C" in the last 72 hours. Lipid Profile No results for input(s): "CHOL", "HDL", "LDLCALC", "TRIG", "CHOLHDL", "LDLDIRECT" in the last 72 hours. Thyroid function studies No results for input(s): "TSH", "T4TOTAL", "T3FREE", "THYROIDAB" in the last 72 hours.  Invalid input(s): "FREET3" Anemia work up No results for input(s): "VITAMINB12", "FOLATE", "FERRITIN", "TIBC", "IRON", "RETICCTPCT" in the last 72 hours. Urinalysis    Component Value Date/Time   COLORURINE YELLOW 10/10/2023 1417   APPEARANCEUR CLEAR 10/10/2023 1417   LABSPEC 1.013 10/10/2023 1417   PHURINE 5.0 10/10/2023 1417   GLUCOSEU >=500 (A) 10/10/2023 1417   HGBUR NEGATIVE 10/10/2023 1417   BILIRUBINUR NEGATIVE 10/10/2023 1417   KETONESUR NEGATIVE 10/10/2023 1417   PROTEINUR NEGATIVE 10/10/2023 1417   UROBILINOGEN 0.2 09/08/2014 0927   NITRITE NEGATIVE 10/10/2023 1417   LEUKOCYTESUR NEGATIVE 10/10/2023 1417   Sepsis Labs Recent Labs  Lab 10/11/23 0842 10/13/23 0226 10/14/23 0158 10/15/23 0249  WBC 16.7* 10.1 7.3 9.4   Microbiology Recent Results (from the past 240 hours)  Resp panel by RT-PCR (RSV, Flu A&B, Covid) Anterior Nasal Swab     Status: None   Collection Time: 10/10/23  9:17 AM   Specimen: Anterior Nasal Swab  Result Value Ref Range Status   SARS Coronavirus 2 by RT PCR NEGATIVE NEGATIVE Final   Influenza A by PCR NEGATIVE NEGATIVE Final   Influenza B by PCR NEGATIVE NEGATIVE Final    Comment: (NOTE) The Xpert Xpress SARS-CoV-2/FLU/RSV plus assay is intended as an aid in the diagnosis of influenza from Nasopharyngeal swab specimens and should not be used as a sole basis for treatment. Nasal washings and aspirates are unacceptable for Xpert Xpress SARS-CoV-2/FLU/RSV testing.  Fact Sheet for Patients: BloggerCourse.com  Fact Sheet for Healthcare  Providers: SeriousBroker.it  This test is not yet approved or cleared by the [place] FDA and has been authorized for detection and/or diagnosis of SARS-CoV-2 by FDA under an Emergency Use Authorization (EUA). This EUA will remain in effect (meaning this test can be used) for the duration of the COVID-19 declaration under Section 564(b)(1) of the Act, 21 U.S.C. section 360bbb-3(b)(1), unless the authorization is terminated or revoked.     Resp Syncytial Virus by PCR NEGATIVE NEGATIVE Final    Comment: (NOTE) Fact Sheet for Patients: BloggerCourse.com  Fact Sheet for Healthcare Providers: SeriousBroker.it  This test is not yet approved or cleared by the [place] FDA and has been authorized for detection and/or diagnosis of SARS-CoV-2 by FDA under an Emergency Use Authorization (EUA). This EUA will remain in effect (meaning this test can be used) for the duration of the COVID-19 declaration under Section 564(b)(1) of the Act, 21 U.S.C. section 360bbb-3(b)(1), unless the authorization is terminated or revoked.  Performed at [facility], [address], [place], [place] [address]   Culture, blood (routine x 2)     Status: None   Collection Time: 10/10/23  9:25 AM   Specimen: BLOOD RIGHT ARM  Result Value Ref Range Status   Specimen Description BLOOD RIGHT ARM  Final   Special Requests   Final    BOTTLES DRAWN AEROBIC AND ANAEROBIC Blood Culture adequate volume   Culture   Final    NO GROWTH 5 DAYS Performed at [facility], [address], [place], [place] [address]    Report Status 10/15/2023 FINAL  Final  Culture, blood (routine x 2)     Status: None   Collection Time: 10/10/23  9:30 AM   Specimen: BLOOD RIGHT ARM  Result Value Ref Range Status   Specimen Description BLOOD RIGHT ARM  Final   Special Requests   Final    BOTTLES DRAWN AEROBIC AND ANAEROBIC Blood Culture  adequate volume   Culture   Final    NO GROWTH 5 DAYS Performed at [facility], [address], [place], [place] [address]    Report Status 10/15/2023 FINAL  Final  Respiratory (~20 pathogens) panel by PCR     Status: None   Collection Time: 10/10/23  1:27 PM   Specimen: Nasopharyngeal Swab; Respiratory  Result Value Ref Range Status   Adenovirus NOT DETECTED NOT DETECTED Final   Coronavirus 229E NOT DETECTED NOT DETECTED Final    Comment: (NOTE) The Coronavirus on the Respiratory Panel, DOES NOT test for the novel  Coronavirus (2019 nCoV)    Coronavirus HKU1  NOT DETECTED NOT DETECTED Final   Coronavirus NL63 NOT DETECTED NOT DETECTED Final   Coronavirus OC43 NOT DETECTED NOT DETECTED Final   Metapneumovirus NOT DETECTED NOT DETECTED Final   Rhinovirus / Enterovirus NOT DETECTED NOT DETECTED Final   Influenza A NOT DETECTED NOT DETECTED Final   Influenza B NOT DETECTED NOT DETECTED Final   Parainfluenza Virus 1 NOT DETECTED NOT DETECTED Final   Parainfluenza Virus 2 NOT DETECTED NOT DETECTED Final   Parainfluenza Virus 3 NOT DETECTED NOT DETECTED Final   Parainfluenza Virus 4 NOT DETECTED NOT DETECTED Final   Respiratory Syncytial Virus NOT DETECTED NOT DETECTED Final   Bordetella pertussis NOT DETECTED NOT DETECTED Final   Bordetella Parapertussis NOT DETECTED NOT DETECTED Final   Chlamydophila pneumoniae NOT DETECTED NOT DETECTED Final   Mycoplasma pneumoniae NOT DETECTED NOT DETECTED Final    Comment: Performed at [facility], [address] [place], [place] [address]  MRSA Next Gen by PCR, Nasal     Status: None   Collection Time: 10/10/23  2:45 PM   Specimen: Nasal Mucosa; Nasal Swab  Result Value Ref Range Status   MRSA by PCR Next Gen NOT DETECTED NOT DETECTED Final    Comment: (NOTE) The GeneXpert MRSA Assay (FDA approved for NASAL specimens only), is one component of a comprehensive MRSA colonization surveillance program. It is not intended to diagnose  MRSA infection nor to guide or monitor treatment for MRSA infections. Test performance is not FDA approved in patients less than 19 years old. Performed at [facility], [address], [place], [place] [address]   Expectorated Sputum Assessment w Gram Stain, Rflx to Resp Cult     Status: None   Collection Time: 10/10/23 11:25 PM   Specimen: Expectorated Sputum  Result Value Ref Range Status   Specimen Description EXPECTORATED SPUTUM  Final   Special Requests NONE  Final   Sputum evaluation   Final    THIS SPECIMEN IS ACCEPTABLE FOR SPUTUM CULTURE Performed at [facility], [address] [place], [place] [address]    Report Status 10/11/2023 FINAL  Final  Culture, Respiratory w Gram Stain     Status: None   Collection Time: 10/10/23 11:25 PM  Result Value Ref Range Status   Specimen Description EXPECTORATED SPUTUM  Final   Special Requests NONE Reflexed from [id number]  Final   Gram Stain   Final    FEW WBC PRESENT, PREDOMINANTLY PMN NO ORGANISMS SEEN    Culture   Final    FEW Normal respiratory flora-no Staph aureus or Pseudomonas seen Performed at [facility], [address], [place], [place] [address]    Report Status 10/13/2023 FINAL  Final    Procedures/Studies: DG CHEST PORT 1 VIEW Result Date: 10/13/2023 CLINICAL DATA:  Dyspnea EXAM: PORTABLE CHEST 1 VIEW COMPARISON:  10/10/2023 FINDINGS: Cardiomegaly. Aortic atherosclerosis. Patchy bilateral airspace opacities, progressed within the left perihilar and left basilar region. Improved aeration at the right lung base. No pleural effusion or pneumothorax. IMPRESSION: Patchy bilateral airspace opacities, progressed within the left perihilar and left basilar region. Improved aeration at the right lung base. Electronically Signed   By: Duanne Guess D.O.   On: 10/13/2023 14:42   ECHOCARDIOGRAM COMPLETE Result Date: 10/11/2023    ECHOCARDIOGRAM REPORT   Patient Name:   Joshua [name] Date of Exam: 10/11/2023  Medical Rec #:  [id number]        Height:  75.0 in Accession #:    [id number]       Weight:       229.9 lb Date of Birth:  10-29-1935         BSA:          2.328 m Patient Age:    87 years         BP:           97/56 mmHg Patient Gender: M                HR:           90 bpm. Exam Location:  Inpatient Procedure: 2D Echo, Cardiac Doppler and Color Doppler (Both Spectral and Color            Flow Doppler were utilized during procedure). Indications:    CHF- Acute Diastolic  History:        Patient has prior history of Echocardiogram examinations, most                 recent 01/09/2023. COPD; Risk Factors:Sleep Apnea and Former                 Smoker.  Sonographer:    Karma Ganja Referring Phys: GRACE E BOWSER IMPRESSIONS  1. Left ventricular ejection fraction, by estimation, is 60 to 65%. The left ventricle has normal function. The left ventricle has no regional wall motion abnormalities. Left ventricular diastolic parameters are consistent with Grade I diastolic dysfunction (impaired relaxation).  2. D shaped septum suggesting cor pulmonale. Right ventricular systolic function is severely reduced. The right ventricular size is severely enlarged. There is severely elevated pulmonary artery systolic pressure.  3. Right atrial size was moderately dilated.  4. The mitral valve is normal in structure. No evidence of mitral valve regurgitation. No evidence of mitral stenosis.  5. Tricuspid valve regurgitation is mild to moderate.  6. The aortic valve is tricuspid. There is mild calcification of the aortic valve. There is mild thickening of the aortic valve. Aortic valve regurgitation is trivial. Aortic valve sclerosis is present, with no evidence of aortic valve stenosis.  7. Pulmonic valve regurgitation is moderate.  8. There is borderline dilatation of the aortic root, measuring 37 mm.  9. The inferior vena cava is dilated in size with <50% respiratory variability, suggesting right atrial pressure of 15 mmHg. FINDINGS   Left Ventricle: Left ventricular ejection fraction, by estimation, is 60 to 65%. The left ventricle has normal function. The left ventricle has no regional wall motion abnormalities. Strain imaging was not performed. The left ventricular internal cavity  size was normal in size. There is no left ventricular hypertrophy. Left ventricular diastolic parameters are consistent with Grade I diastolic dysfunction (impaired relaxation). Right Ventricle: D shaped septum suggesting cor pulmonale. The right ventricular size is severely enlarged. No increase in right ventricular wall thickness. Right ventricular systolic function is severely reduced. There is severely elevated pulmonary artery systolic pressure. The tricuspid regurgitant velocity is 3.68 m/s, and with an assumed right atrial pressure of 15 mmHg, the estimated right ventricular systolic pressure is 69.2 mmHg. Left Atrium: Left atrial size was normal in size. Right Atrium: Right atrial size was moderately dilated. Pericardium: There is no evidence of pericardial effusion. Mitral Valve: The mitral valve is normal in structure. No evidence of mitral valve regurgitation. No evidence of mitral valve stenosis. Tricuspid Valve: The tricuspid valve is normal in structure. Tricuspid valve regurgitation is mild to moderate. No evidence of tricuspid  stenosis. Aortic Valve: The aortic valve is tricuspid. There is mild calcification of the aortic valve. There is mild thickening of the aortic valve. Aortic valve regurgitation is trivial. Aortic valve sclerosis is present, with no evidence of aortic valve stenosis. Aortic valve mean gradient measures 3.0 mmHg. Aortic valve peak gradient measures 5.3 mmHg. Aortic valve area, by VTI measures 3.09 cm. Pulmonic Valve: The pulmonic valve was normal in structure. Pulmonic valve regurgitation is moderate. No evidence of pulmonic stenosis. Aorta: The aortic root is normal in size and structure. There is borderline dilatation of the  aortic root, measuring 37 mm. Venous: The inferior vena cava is dilated in size with less than 50% respiratory variability, suggesting right atrial pressure of 15 mmHg. IAS/Shunts: No atrial level shunt detected by color flow Doppler. Additional Comments: 3D imaging was not performed.  LEFT VENTRICLE PLAX 2D LVIDd:         3.80 cm   Diastology LVIDs:         2.40 cm   LV e' medial:    6.31 cm/s LV PW:         1.00 cm   LV E/e' medial:  7.9 LV IVS:        0.90 cm   LV e' lateral:   5.44 cm/s LVOT diam:     2.20 cm   LV E/e' lateral: 9.2 LV SV:         61 LV SV Index:   26 LVOT Area:     3.80 cm  RIGHT VENTRICLE RV Basal diam:  6.20 cm RV S prime:     15.00 cm/s TAPSE (M-mode): 2.2 cm LEFT ATRIUM           Index        RIGHT ATRIUM           Index LA diam:      3.20 cm 1.37 cm/m   RA Area:     28.40 cm LA Vol (A2C): 59.7 ml 25.64 ml/m  RA Volume:   97.40 ml  41.83 ml/m LA Vol (A4C): 28.8 ml 12.37 ml/m  AORTIC VALVE AV Area (Vmax):    3.34 cm AV Area (Vmean):   3.05 cm AV Area (VTI):     3.09 cm AV Vmax:           115.00 cm/s AV Vmean:          77.000 cm/s AV VTI:            0.197 m AV Peak Grad:      5.3 mmHg AV Mean Grad:      3.0 mmHg LVOT Vmax:         101.00 cm/s LVOT Vmean:        61.800 cm/s LVOT VTI:          0.160 m LVOT/AV VTI ratio: 0.81  AORTA Ao Root diam: 3.70 cm MITRAL VALVE               TRICUSPID VALVE MV Area (PHT): 3.50 cm    TR Peak grad:   54.2 mmHg MV Decel Time: 217 msec    TR Vmax:        368.00 cm/s MV E velocity: 49.80 cm/s MV A velocity: 82.30 cm/s  SHUNTS MV E/A ratio:  0.61        Systemic VTI:  0.16 m  Systemic Diam: 2.20 cm Charlton Haws MD Electronically signed by Charlton Haws MD Signature Date/Time: 10/11/2023/2:56:58 PM    Final    DG Chest Portable 1 View Result Date: 10/10/2023 CLINICAL DATA:  Shortness of breath EXAM: PORTABLE CHEST 1 VIEW COMPARISON:  Chest radiograph dated 12/02/2022 FINDINGS: Normal lung volumes. Confluent bibasilar opacities,  right-greater-than-left. No pleural effusion or pneumothorax. Enlarged cardiomediastinal silhouette is likely projectional. No acute osseous abnormality. IMPRESSION: Confluent bibasilar opacities, right-greater-than-left, which may represent atelectasis, aspiration, or pneumonia. Electronically Signed   By: Agustin Cree M.D.   On: 10/10/2023 10:22     Time coordinating discharge: Over 30 minutes    Lewie Chamber, MD  [facility] 10/17/2023, 9:34 AM

## 2023-10-17 NOTE — Plan of Care (Signed)
  Problem: Nutritional: Goal: Maintenance of adequate nutrition will improve Outcome: Progressing   Problem: Skin Integrity: Goal: Risk for impaired skin integrity will decrease Outcome: Progressing   Problem: Tissue Perfusion: Goal: Adequacy of tissue perfusion will improve Outcome: Progressing   Problem: Clinical Measurements: Goal: Will remain free from infection Outcome: Progressing   Problem: Activity: Goal: Risk for activity intolerance will decrease Outcome: Progressing

## 2023-10-17 NOTE — TOC Transition Note (Signed)
 Transition of Care [facility]) - Discharge Note   Patient Details  Name: Joshua [name] MRN: [id number] Date of Birth: 13-Jul-1936  Transition of [facility]) CM/SW Contact:  Lockie Pares, RN Phone Number: 10/17/2023, 9:43 AM   Clinical Narrative:     Sherron Monday to [facility]. She has discharges that are complex, cannot guarantee a bed today, but will make every effort she can to admit him to select today.  Final next level of care: Long Term Acute Care (LTAC) Barriers to Discharge: No Barriers Identified   Patient Goals and CMS Choice Patient states their goals for this hospitalization and ongoing recovery are:: patient agreeable to to SNF          Discharge Placement                       Discharge Plan and Services Additional resources added to the After Visit Summary for     Discharge Planning Services: CM Consult Post Acute Care Choice: Skilled Nursing Facility                               Social Drivers of Health (SDOH) Interventions SDOH Screenings   Food Insecurity: No Food Insecurity (10/10/2023)  Housing: Low Risk  (10/12/2023)  Transportation Needs: Unmet Transportation Needs (10/10/2023)  Utilities: Not At Risk (10/10/2023)  Depression (PHQ2-9): Low Risk  (03/13/2022)  Social Connections: Socially Integrated (10/10/2023)  Stress: No Stress Concern Present (05/09/2021)  Tobacco Use: Medium Risk (10/10/2023)     Readmission Risk Interventions    10/05/2021    3:15 PM  Readmission Risk Prevention Plan  Transportation Screening Complete  Medication Review (RN Care Manager) Complete  PCP or Specialist appointment within 3-5 days of discharge Complete  HRI or Home Care Consult Complete  SW Recovery Care/Counseling Consult Complete  Palliative Care Screening Not Applicable  Skilled Nursing Facility Not Applicable

## 2023-10-17 NOTE — Progress Notes (Signed)
 Physical Therapy Treatment Patient Details Name: Joshua [name] MRN: [id number] DOB: 10-Jun-1936 Today's Date: 10/17/2023   History of Present Illness 88 year old male who presented with worsening shortness of breath PMH: chronic respiratory failure with hypoxia, emphysema, pulmonary hypertension, chronic PE, CKD, aspiration pneumonia, chronic hypotension, OSA    PT Comments  Pt is slowly progressing towards goals. Continues requiring assistance with lines. Pt is still on HHFNC with slightly increased needs from last session. Pt improved with need for physical assist including supervision for bed mobility, CGA for sit to stand and gait with RW. Due to pt current functional status, home set up and available assistance at home recommending skilled physical therapy services 3x/week in order to address strength, balance and functional mobility to decrease risk for falls, injury and re-hospitalization.       If plan is discharge home, recommend the following: A little help with walking and/or transfers;A little help with bathing/dressing/bathroom;Help with stairs or ramp for entrance     Equipment Recommendations  None recommended by PT       Precautions / Restrictions Precautions Precautions: Fall Precaution/Restrictions Comments: watch SpO2, pt on HHFNC Restrictions Weight Bearing Restrictions Per Provider Order: No     Mobility  Bed Mobility Overal bed mobility: Needs Assistance Bed Mobility: Sit to Supine       Sit to supine: Supervision   General bed mobility comments: supervision for lines    Transfers Overall transfer level: Needs assistance Equipment used: Rolling walker (2 wheels) Transfers: Sit to/from Stand, Bed to chair/wheelchair/BSC Sit to Stand: Contact guard assist   Step pivot transfers: Contact guard assist       General transfer comment: CGA from EOB to recliner without an AD  and o2 sats 91% on HHFNC 20L 40% FiO2    Ambulation/Gait Ambulation/Gait  assistance: Contact guard assist Gait Distance (Feet): 12 Feet Assistive device: Rolling walker (2 wheels) Gait Pattern/deviations: Step-to pattern, Decreased stride length, Trunk flexed, Shuffle Gait velocity: decreased Gait velocity interpretation: <1.31 ft/sec, indicative of household ambulator   General Gait Details: increased time, step to shuffling gait pattern, directional verbal cues during turning     Balance Overall balance assessment: Needs assistance Sitting-balance support: Feet supported, No upper extremity supported Sitting balance-Leahy Scale: Fair     Standing balance support: During functional activity, Reliant on assistive device for balance, No upper extremity supported Standing balance-Leahy Scale: Fair Standing balance comment: No overt LOB        Communication Communication Communication: No apparent difficulties  Cognition Arousal: Alert Behavior During Therapy: WFL for tasks assessed/performed   PT - Cognitive impairments: No apparent impairments   Following commands: Intact      Cueing Cueing Techniques: Verbal cues     General Comments General comments (skin integrity, edema, etc.): SPO2 remained in the 90s during gait on HHFNC 20L fiO2 40%      Pertinent Vitals/Pain Pain Assessment Pain Assessment: No/denies pain     PT Goals (current goals can now be found in the care plan section) Acute Rehab PT Goals Patient Stated Goal: home PT Goal Formulation: With patient Time For Goal Achievement: 10/27/23 Potential to Achieve Goals: Good Progress towards PT goals: Progressing toward goals    Frequency    Min 1X/week      PT Plan  Continue with current POC        AM-PAC PT "6 Clicks" Mobility   Outcome Measure  Help needed turning from your back to your side while in a flat bed  without using bedrails?: A Little Help needed moving from lying on your back to sitting on the side of a flat bed without using bedrails?: A Little Help  needed moving to and from a bed to a chair (including a wheelchair)?: A Little Help needed standing up from a chair using your arms (e.g., wheelchair or bedside chair)?: A Little Help needed to walk in hospital room?: A Little Help needed climbing 3-5 steps with a railing? : A Lot 6 Click Score: 17    End of Session Equipment Utilized During Treatment: Gait belt;Oxygen Activity Tolerance: Patient tolerated treatment well Patient left: in bed;with bed alarm set;with call bell/phone within reach;with family/visitor present Nurse Communication: Mobility status PT Visit Diagnosis: Unsteadiness on feet (R26.81)     Time: 1353-1410 PT Time Calculation (min) (ACUTE ONLY): 17 min  Charges:    $Therapeutic Activity: 8-22 mins PT General Charges $$ ACUTE PT VISIT: 1 Visit           Harrel Carina, DPT, CLT  Acute Rehabilitation Services Office: [phone] (Secure chat preferred)    Claudia Desanctis 10/17/2023, 4:43 PM

## 2023-10-18 DIAGNOSIS — R6521 Severe sepsis with septic shock: Secondary | ICD-10-CM | POA: Diagnosis not present

## 2023-10-18 DIAGNOSIS — A419 Sepsis, unspecified organism: Secondary | ICD-10-CM | POA: Diagnosis not present

## 2023-10-18 DIAGNOSIS — J189 Pneumonia, unspecified organism: Secondary | ICD-10-CM | POA: Diagnosis not present

## 2023-10-18 DIAGNOSIS — J9621 Acute and chronic respiratory failure with hypoxia: Secondary | ICD-10-CM | POA: Diagnosis not present

## 2023-10-18 NOTE — Plan of Care (Signed)
  Problem: Clinical Measurements: Goal: Diagnostic test results will improve Outcome: Progressing Goal: Respiratory complications will improve Outcome: Progressing Goal: Cardiovascular complication will be avoided Outcome: Progressing   Problem: Activity: Goal: Risk for activity intolerance will decrease Outcome: Progressing   Problem: Coping: Goal: Level of anxiety will decrease Outcome: Progressing   Problem: Pain Managment: Goal: General experience of comfort will improve and/or be controlled Outcome: Progressing   Problem: Safety: Goal: Ability to remain free from injury will improve Outcome: Progressing

## 2023-10-18 NOTE — Progress Notes (Signed)
 Pt weaned from [facility] to HFNC 10lpm. Pt tolerating well at this time.

## 2023-10-18 NOTE — Progress Notes (Signed)
 Progress Note    Joshua [name]   JXB:[id number]  DOB: 16-Mar-1936  DOA: 10/10/2023     8 PCP: [name], DO  Initial CC: SOB  Hospital Course: 88 year old male with chronic respiratory failure with hypoxia on 2-3L O2 at home, emphysema, pulmonary hypertension, chronic PE, CKD, aspiration pneumonia, chronic hypotension, OSA presented with worsening shortness of breath. On presentation, he was hypoxic and required BiPAP for oxygen saturations of 70s. He was febrile with leukocytosis and chest x-ray suggestive of possible pneumonia. He was started on broad-spectrum antibiotics. Because of persistent hypotension, he was started on pressors and admitted to ICU under PCCM service. Subsequently, vasopressors have been weaned off with improving blood pressure. Care has been transferred to [facility] service from 10/12/2023 onwards.   Interval History:  No events overnight. Still weaning O2; now down to 10L on salter HF and hopefully avoids having to go back on optiflow.  Still no LTAC bed available.   Assessment and Plan:  Septic shock: Present on admission Bilateral pneumonia with possibility of aspiration pneumonia -Presented with fever, leukocytosis with possible pneumonia and was persistently hypotensive requiring pressors and admission to ICU under PCCM service. -Cultures have been negative so far.  Respiratory panel PCR negative.  Influenza/COVID/RSV PCR negative.  Off pressors.  Blood pressure has mostly improved and currently intermittently on the high side -Care has been transferred to [facility] service from 10/12/2023 onwards. -Antibiotics have been switched to IV Unasyn. Course completed 2/27 -Diet as per SLP recommendations   Acute on chronic hypoxic respiratory failure COPD/emphysema -Required BiPAP on presentation.   - on 2-3L chronic O2 at home - s/p solumedrol; continue prednisone until complete - wean O2 as able - appears slow to wean; agree with LTAC referral    Chronic  hypotension -Currently on midodrine.  Blood pressure intermittently on the higher side. Might have to decrease dose or discontinue if BP continues to remain elevated.   Chronic diastolic heart failure Pulmonary hypertension -Echo showed EF of 65 to 70% with grade 1 diastolic dysfunction.  Continue strict input and output.  Daily weights.  Fluid restriction. -Outpatient follow-up with cardiology   Chronic PE -Continue Eliquis   AKI on CKD stage IIIa - baseline around 1.5 - 1.6; GFR ~ 40 -Creatinine peaked at 1.94 on admission -Now back to baseline   Leukocytosis -Resolved   Hypomagnesemia -Repleted   Lactic acidosis -Resolved   Thrombocytopenia -Resolved   Physical deconditioning -Family requesting SNF placement.  TOC consulted.   Goals of care -Overall prognosis is guarded given respiratory status.  -Hold off on palliative care consult for now  Old records reviewed in assessment of this patient  Antimicrobials: Unasyn 2/22 >> 2/27  DVT prophylaxis:   apixaban (ELIQUIS) tablet 5 mg   Code Status:   Code Status: Do not attempt resuscitation (DNR) PRE-ARREST INTERVENTIONS DESIRED  Mobility Assessment (Last 72 Hours)     Mobility Assessment     Row Name 10/18/23 0800 10/17/23 1934 10/17/23 1642 10/17/23 0749 10/16/23 2029   Does patient have an order for bedrest or is patient medically unstable No - Continue assessment No - Continue assessment -- No - Continue assessment No - Continue assessment   What is the highest level of mobility based on the progressive mobility assessment? Level 3 (Stands with assist) - Balance while standing  and cannot march in place Level 2 (Chairfast) - Balance while sitting on edge of bed and cannot stand Level 4 (Walks with assist in room) - Balance  while marching in place and cannot step forward and back - Complete Level 2 (Chairfast) - Balance while sitting on edge of bed and cannot stand Level 2 (Chairfast) - Balance while sitting on  edge of bed and cannot stand   Is the above level different from baseline mobility prior to current illness? Yes - Recommend PT order No - Consider discontinuing PT/OT -- Yes - Recommend PT order Yes - Recommend PT order    Row Name 10/16/23 1100 10/16/23 0731 10/15/23 1939       Does patient have an order for bedrest or is patient medically unstable -- No - Continue assessment No - Continue assessment     What is the highest level of mobility based on the progressive mobility assessment? Level 3 (Stands with assist) - Balance while standing  and cannot march in place Level 4 (Walks with assist in room) - Balance while marching in place and cannot step forward and back - Complete Level 4 (Walks with assist in room) - Balance while marching in place and cannot step forward and back - Complete     Is the above level different from baseline mobility prior to current illness? -- Yes - Recommend PT order Yes - Recommend PT order              Barriers to discharge:  Disposition Plan:  SNF HH orders placed: n/a Status is: Inpt  Objective: Blood pressure (P) 110/60, pulse 73, temperature 98.3 F (36.8 C), temperature source Oral, resp. rate 18, height 6\' 3"  (1.905 m), weight 82.9 kg, SpO2 98%.  Examination:  Physical Exam Constitutional:      General: He is not in acute distress.    Appearance: Normal appearance.  HENT:     Head: Normocephalic and atraumatic.     Mouth/Throat:     Mouth: Mucous membranes are moist.  Eyes:     Extraocular Movements: Extraocular movements intact.  Cardiovascular:     Rate and Rhythm: Normal rate and regular rhythm.  Pulmonary:     Effort: Pulmonary effort is normal. No respiratory distress.     Breath sounds: Normal breath sounds. No wheezing.  Abdominal:     General: Bowel sounds are normal. There is no distension.     Palpations: Abdomen is soft.     Tenderness: There is no abdominal tenderness.  Musculoskeletal:        General: Normal range of  motion.     Cervical back: Normal range of motion and neck supple.  Skin:    General: Skin is warm and dry.  Neurological:     General: No focal deficit present.     Mental Status: He is alert.  Psychiatric:        Mood and Affect: Mood normal.        Behavior: Behavior normal.      Consultants:    Procedures:    Data Reviewed: No results found for this or any previous visit (from the past 24 hours).   I have reviewed pertinent nursing notes, vitals, labs, and images as necessary. I have ordered labwork to follow up on as indicated.  I have reviewed the last notes from staff over past 24 hours. I have discussed patient's care plan and test results with nursing staff, CM/SW, and other staff as appropriate.  Time spent: Greater than 50% of the 55 minute visit was spent in counseling/coordination of care for the patient as laid out in the A&P.   LOS: 8  days   Lewie Chamber, MD [facility] 10/18/2023, 2:08 PM

## 2023-10-18 NOTE — Plan of Care (Signed)
   Problem: Coping: Goal: Ability to adjust to condition or change in health will improve Outcome: Progressing   Problem: Fluid Volume: Goal: Ability to maintain a balanced intake and output will improve Outcome: Progressing   Problem: Nutritional: Goal: Maintenance of adequate nutrition will improve Outcome: Progressing   Problem: Skin Integrity: Goal: Risk for impaired skin integrity will decrease Outcome: Progressing

## 2023-10-18 NOTE — Progress Notes (Signed)
 Progress Note    Joshua [name]   ZOX:[id number]  DOB: 01-May-1936  DOA: 10/10/2023     8 PCP: [name], DO  Initial CC: SOB  Hospital Course: 88 year old male with chronic respiratory failure with hypoxia on 2-3L O2 at home, emphysema, pulmonary hypertension, chronic PE, CKD, aspiration pneumonia, chronic hypotension, OSA presented with worsening shortness of breath. On presentation, he was hypoxic and required BiPAP for oxygen saturations of 70s. He was febrile with leukocytosis and chest x-ray suggestive of possible pneumonia. He was started on broad-spectrum antibiotics. Because of persistent hypotension, he was started on pressors and admitted to ICU under PCCM service. Subsequently, vasopressors have been weaned off with improving blood pressure. Care has been transferred to [facility] service from 10/12/2023 onwards.   Interval History:  No events overnight. Still weaning O2. Stable for going to LTAC.   Assessment and Plan:  Septic shock: Present on admission Bilateral pneumonia with possibility of aspiration pneumonia -Presented with fever, leukocytosis with possible pneumonia and was persistently hypotensive requiring pressors and admission to ICU under PCCM service. -Cultures have been negative so far.  Respiratory panel PCR negative.  Influenza/COVID/RSV PCR negative.  Off pressors.  Blood pressure has mostly improved and currently intermittently on the high side -Care has been transferred to [facility] service from 10/12/2023 onwards. -Antibiotics have been switched to IV Unasyn. Course completed 2/27 -Diet as per SLP recommendations   Acute on chronic hypoxic respiratory failure COPD/emphysema -Required BiPAP on presentation.   - on 2-3L chronic O2 at home - s/p solumedrol; continue prednisone until complete - wean O2 as able - appears slow to wean; agree with LTAC referral    Chronic hypotension -Currently on midodrine.  Blood pressure intermittently on the higher side. Might  have to decrease dose or discontinue if BP continues to remain elevated.   Chronic diastolic heart failure Pulmonary hypertension -Echo showed EF of 65 to 70% with grade 1 diastolic dysfunction.  Continue strict input and output.  Daily weights.  Fluid restriction. -Outpatient follow-up with cardiology   Chronic PE -Continue Eliquis   AKI on CKD stage IIIa - baseline around 1.5 - 1.6; GFR ~ 40 -Creatinine peaked at 1.94 on admission -Now back to baseline   Leukocytosis -Resolved   Hypomagnesemia -Repleted   Lactic acidosis -Resolved   Thrombocytopenia -Resolved   Physical deconditioning -Family requesting SNF placement.  TOC consulted.   Goals of care -Overall prognosis is guarded given respiratory status.  -Hold off on palliative care consult for now  Old records reviewed in assessment of this patient  Antimicrobials: Unasyn 2/22 >> 2/27  DVT prophylaxis:   apixaban (ELIQUIS) tablet 5 mg   Code Status:   Code Status: Do not attempt resuscitation (DNR) PRE-ARREST INTERVENTIONS DESIRED  Mobility Assessment (Last 72 Hours)     Mobility Assessment     Row Name 10/17/23 1934 10/17/23 1642 10/17/23 0749 10/16/23 2029 10/16/23 1100   Does patient have an order for bedrest or is patient medically unstable No - Continue assessment -- No - Continue assessment No - Continue assessment --   What is the highest level of mobility based on the progressive mobility assessment? Level 2 (Chairfast) - Balance while sitting on edge of bed and cannot stand Level 4 (Walks with assist in room) - Balance while marching in place and cannot step forward and back - Complete Level 2 (Chairfast) - Balance while sitting on edge of bed and cannot stand Level 2 (Chairfast) - Balance while sitting on edge  of bed and cannot stand Level 3 (Stands with assist) - Balance while standing  and cannot march in place   Is the above level different from baseline mobility prior to current illness? No -  Consider discontinuing PT/OT -- Yes - Recommend PT order Yes - Recommend PT order --    Row Name 10/16/23 0731 10/15/23 1939 10/15/23 1319 10/15/23 0800     Does patient have an order for bedrest or is patient medically unstable No - Continue assessment No - Continue assessment -- No - Continue assessment    What is the highest level of mobility based on the progressive mobility assessment? Level 4 (Walks with assist in room) - Balance while marching in place and cannot step forward and back - Complete Level 4 (Walks with assist in room) - Balance while marching in place and cannot step forward and back - Complete Level 4 (Walks with assist in room) - Balance while marching in place and cannot step forward and back - Complete Level 4 (Walks with assist in room) - Balance while marching in place and cannot step forward and back - Complete    Is the above level different from baseline mobility prior to current illness? Yes - Recommend PT order Yes - Recommend PT order -- Yes - Recommend PT order             Barriers to discharge:  Disposition Plan:  SNF HH orders placed: n/a Status is: Inpt  Objective: Blood pressure (!) 103/55, pulse 74, temperature 98.3 F (36.8 C), temperature source Oral, resp. rate 16, height 6\' 3"  (1.905 m), weight 82.9 kg, SpO2 96%.  Examination:  Physical Exam Constitutional:      General: He is not in acute distress.    Appearance: Normal appearance.  HENT:     Head: Normocephalic and atraumatic.     Mouth/Throat:     Mouth: Mucous membranes are moist.  Eyes:     Extraocular Movements: Extraocular movements intact.  Cardiovascular:     Rate and Rhythm: Normal rate and regular rhythm.  Pulmonary:     Effort: Pulmonary effort is normal. No respiratory distress.     Breath sounds: Normal breath sounds. No wheezing.  Abdominal:     General: Bowel sounds are normal. There is no distension.     Palpations: Abdomen is soft.     Tenderness: There is no  abdominal tenderness.  Musculoskeletal:        General: Normal range of motion.     Cervical back: Normal range of motion and neck supple.  Skin:    General: Skin is warm and dry.  Neurological:     General: No focal deficit present.     Mental Status: He is alert.  Psychiatric:        Mood and Affect: Mood normal.        Behavior: Behavior normal.      Consultants:    Procedures:    Data Reviewed: No results found for this or any previous visit (from the past 24 hours).   I have reviewed pertinent nursing notes, vitals, labs, and images as necessary. I have ordered labwork to follow up on as indicated.  I have reviewed the last notes from staff over past 24 hours. I have discussed patient's care plan and test results with nursing staff, CM/SW, and other staff as appropriate.  Time spent: Greater than 50% of the 55 minute visit was spent in counseling/coordination of care for the patient as  laid out in the A&P.   LOS: 8 days   Lewie Chamber, MD [facility] 10/18/2023, 7:55 AM

## 2023-10-19 DIAGNOSIS — J9621 Acute and chronic respiratory failure with hypoxia: Secondary | ICD-10-CM | POA: Diagnosis not present

## 2023-10-19 DIAGNOSIS — A419 Sepsis, unspecified organism: Secondary | ICD-10-CM | POA: Diagnosis not present

## 2023-10-19 DIAGNOSIS — R6521 Severe sepsis with septic shock: Secondary | ICD-10-CM | POA: Diagnosis not present

## 2023-10-19 NOTE — Progress Notes (Signed)
 Progress Note    Joshua [name]   OZH:[id number]  DOB: [birth year]  DOA: 10/10/2023     9 PCP: [name], DO  Initial CC: SOB  Hospital Course: 88 year old male with chronic respiratory failure with hypoxia on 2-3L O2 at home, emphysema, pulmonary hypertension, chronic PE, CKD, aspiration pneumonia, chronic hypotension, OSA presented with worsening shortness of breath. On presentation, he was hypoxic and required BiPAP for oxygen saturations of 70s. He was febrile with leukocytosis and chest x-ray suggestive of possible pneumonia. He was started on broad-spectrum antibiotics. Because of persistent hypotension, he was started on pressors and admitted to ICU under PCCM service. Subsequently, vasopressors have been weaned off with improving blood pressure. Care has been transferred to [facility] service from 10/12/2023 onwards.   Interval History:  He was weaned tremendously down on oxygen since yesterday and now is back on his home settings of 2 to 3 L. We will cancel LTAC discharge and have PT/OT reevaluate for disposition planning.  Assessment and Plan:  Septic shock: Present on admission Bilateral pneumonia with possibility of aspiration pneumonia -Presented with fever, leukocytosis with possible pneumonia and was persistently hypotensive requiring pressors and admission to ICU under PCCM service. -Cultures have been negative so far.  Respiratory panel PCR negative.  Influenza/COVID/RSV PCR negative.  Off pressors.  Blood pressure has mostly improved and currently intermittently on the high side -Care has been transferred to [facility] service from 10/12/2023 onwards. -Antibiotics have been switched to IV Unasyn. Course completed 2/27 -Diet as per SLP recommendations   Acute on chronic hypoxic respiratory failure COPD/emphysema -Required BiPAP on presentation.   - on 2-3L chronic O2 at home - s/p solumedrol; continue prednisone until complete -Has now been weaned back down to home settings; no  further need for LTAC   Chronic hypotension -Currently on midodrine.  Blood pressure intermittently on the higher side. Might have to decrease dose or discontinue if BP continues to remain elevated.   Chronic diastolic heart failure Pulmonary hypertension -Echo showed EF of 65 to 70% with grade 1 diastolic dysfunction.  Continue strict input and output.  Daily weights.  Fluid restriction. -Outpatient follow-up with cardiology   Chronic PE -Continue Eliquis   AKI on CKD stage IIIa - baseline around 1.5 - 1.6; GFR ~ 40 -Creatinine peaked at 1.94 on admission -Now back to baseline   Leukocytosis -Resolved   Hypomagnesemia -Repleted   Lactic acidosis -Resolved   Thrombocytopenia -Resolved   Physical deconditioning -Family requesting SNF placement.  TOC consulted   Goals of care -Overall prognosis is guarded given respiratory status.  -Hold off on palliative care consult for now  Old records reviewed in assessment of this patient  Antimicrobials: Unasyn 2/22 >> 2/27  DVT prophylaxis:   apixaban (ELIQUIS) tablet 5 mg   Code Status:   Code Status: Do not attempt resuscitation (DNR) PRE-ARREST INTERVENTIONS DESIRED  Mobility Assessment (Last 72 Hours)     Mobility Assessment     Row Name 10/19/23 0757 10/18/23 2000 10/18/23 0800 10/17/23 1934 10/17/23 1642   Does patient have an order for bedrest or is patient medically unstable No - Continue assessment No - Continue assessment No - Continue assessment No - Continue assessment --   What is the highest level of mobility based on the progressive mobility assessment? Level 2 (Chairfast) - Balance while sitting on edge of bed and cannot stand Level 2 (Chairfast) - Balance while sitting on edge of bed and cannot stand Level 3 (Stands with assist) -  Balance while standing  and cannot march in place Level 2 (Chairfast) - Balance while sitting on edge of bed and cannot stand Level 4 (Walks with assist in room) - Balance while  marching in place and cannot step forward and back - Complete   Is the above level different from baseline mobility prior to current illness? Yes - Recommend PT order Yes - Recommend PT order Yes - Recommend PT order No - Consider discontinuing PT/OT --    Row Name 10/17/23 0749 10/16/23 2029         Does patient have an order for bedrest or is patient medically unstable No - Continue assessment No - Continue assessment      What is the highest level of mobility based on the progressive mobility assessment? Level 2 (Chairfast) - Balance while sitting on edge of bed and cannot stand Level 2 (Chairfast) - Balance while sitting on edge of bed and cannot stand      Is the above level different from baseline mobility prior to current illness? Yes - Recommend PT order Yes - Recommend PT order               Barriers to discharge:  Disposition Plan:  SNF HH orders placed: n/a Status is: Inpt  Objective: Blood pressure 109/69, pulse 61, temperature 97.9 F (36.6 C), temperature source Oral, resp. rate 17, height 6\' 3"  (1.905 m), weight 83.2 kg, SpO2 97%.  Examination:  Physical Exam Constitutional:      General: He is not in acute distress.    Appearance: Normal appearance.  HENT:     Head: Normocephalic and atraumatic.     Mouth/Throat:     Mouth: Mucous membranes are moist.  Eyes:     Extraocular Movements: Extraocular movements intact.  Cardiovascular:     Rate and Rhythm: Normal rate and regular rhythm.  Pulmonary:     Effort: Pulmonary effort is normal. No respiratory distress.     Breath sounds: Normal breath sounds. No wheezing.  Abdominal:     General: Bowel sounds are normal. There is no distension.     Palpations: Abdomen is soft.     Tenderness: There is no abdominal tenderness.  Musculoskeletal:        General: Normal range of motion.     Cervical back: Normal range of motion and neck supple.  Skin:    General: Skin is warm and dry.  Neurological:     General: No  focal deficit present.     Mental Status: He is alert.  Psychiatric:        Mood and Affect: Mood normal.        Behavior: Behavior normal.      Consultants:    Procedures:    Data Reviewed: No results found for this or any previous visit (from the past 24 hours).   I have reviewed pertinent nursing notes, vitals, labs, and images as necessary. I have ordered labwork to follow up on as indicated.  I have reviewed the last notes from staff over past 24 hours. I have discussed patient's care plan and test results with nursing staff, CM/SW, and other staff as appropriate.  Time spent: Greater than 50% of the 55 minute visit was spent in counseling/coordination of care for the patient as laid out in the A&P.   LOS: 9 days   Lewie Chamber, MD [facility] 10/19/2023, 1:27 PM

## 2023-10-19 NOTE — Progress Notes (Signed)
 Mobility Specialist Progress Note;   10/19/23 0845  Mobility  Activity Transferred from bed to chair  Level of Assistance Contact guard assist, steadying assist  Assistive Device None  Distance Ambulated (ft) 5 ft  Activity Response Tolerated well  Mobility Referral Yes  Mobility visit 1 Mobility  Mobility Specialist Start Time (ACUTE ONLY) 0845  Mobility Specialist Stop Time (ACUTE ONLY) 0853  Mobility Specialist Time Calculation (min) (ACUTE ONLY) 8 min   Pt agreeable to mobility. Required light MinG assistance to safely transfer pt from bed to chair. VSS on 3LO2 and no c/o when asked. Pt left in chair with all needs met, alarm on.  Caesar Bookman Mobility Specialist Please contact via SecureChat or [employer] [phone]

## 2023-10-20 DIAGNOSIS — A419 Sepsis, unspecified organism: Secondary | ICD-10-CM | POA: Diagnosis not present

## 2023-10-20 DIAGNOSIS — J189 Pneumonia, unspecified organism: Secondary | ICD-10-CM | POA: Diagnosis not present

## 2023-10-20 DIAGNOSIS — J9621 Acute and chronic respiratory failure with hypoxia: Secondary | ICD-10-CM | POA: Diagnosis not present

## 2023-10-20 DIAGNOSIS — I959 Hypotension, unspecified: Secondary | ICD-10-CM | POA: Diagnosis not present

## 2023-10-20 NOTE — Progress Notes (Signed)
 Physical Therapy Treatment Patient Details Name: Joshua [name] MRN: [id number] DOB: 04/12/1936 Today's Date: 10/20/2023   History of Present Illness 88 year old male who presented with worsening shortness of breath PMH: chronic respiratory failure with hypoxia, emphysema, pulmonary hypertension, chronic PE, CKD, aspiration pneumonia, chronic hypotension, OSA    PT Comments  Patient very motivated. Ambulated 120 ft with RW and 2L O2 with sats 88%. No imbalance with use of RW. Seated rest and then did single step holding onto bed rail x 5 reps sats 89% followed by a seated rest. Pt then completed 12 steps (same set up) with sats down to 87% with quick recovery to 88% (<15 seconds). By end of session, sats 94% at rest. Discussed discharge plan and pt in agreement that he could manage at home from a mobility standpoint. We discussed where his concentrator is (upstairs) and how he uses his portable tank when he goes downstairs. He is in agreement with HHPT for continued progress on strength, balance, and progressing to no assistive device.     If plan is discharge home, recommend the following: A little help with walking and/or transfers;A little help with bathing/dressing/bathroom;Help with stairs or ramp for entrance   Can travel by private vehicle        Equipment Recommendations  Rolling walker (2 wheels)    Recommendations for Other Services       Precautions / Restrictions Precautions Precautions: Fall Precaution/Restrictions Comments: watch SpO2 Restrictions Weight Bearing Restrictions Per Provider Order: No     Mobility  Bed Mobility Overal bed mobility: Needs Assistance Bed Mobility: Sit to Supine     Supine to sit: Modified independent (Device/Increase time)     General bed mobility comments: pt with good awareness of lines; slight incr time    Transfers Overall transfer level: Needs assistance Equipment used: Rolling walker (2 wheels) Transfers: Sit to/from Stand,  Bed to chair/wheelchair/BSC Sit to Stand: Contact guard assist           General transfer comment: CGA from EOB and recliner x 3 reps    Ambulation/Gait Ambulation/Gait assistance: Contact guard assist Gait Distance (Feet): 120 Feet Assistive device: Rolling walker (2 wheels) Gait Pattern/deviations: Decreased stride length, Step-through pattern Gait velocity: decreased Gait velocity interpretation: 1.31 - 2.62 ft/sec, indicative of limited community ambulator   General Gait Details: increased time, on 2L O2   Stairs Stairs: Yes Stairs assistance: Min assist Stair Management: One rail Right, Step to pattern, Forwards Number of Stairs: 5 (seated rest, 12) General stair comments: Pt needed no cues; slight boosting assist as he fatigued   Wheelchair Mobility     Tilt Bed    Modified Rankin (Stroke Patients Only)       Balance Overall balance assessment: Needs assistance Sitting-balance support: Feet supported, No upper extremity supported Sitting balance-Leahy Scale: Fair     Standing balance support: During functional activity, Reliant on assistive device for balance, No upper extremity supported Standing balance-Leahy Scale: Fair Standing balance comment: No overt LOB                            Communication Communication Communication: No apparent difficulties  Cognition Arousal: Alert Behavior During Therapy: WFL for tasks assessed/performed   PT - Cognitive impairments: No apparent impairments                         Following commands: Intact  Cueing Cueing Techniques: Verbal cues  Exercises      General Comments General comments (skin integrity, edema, etc.): On 2L throughout with sats at lowest 87% (with good pleth) with quick recovery to 89% and ultimately 94% (at rest)      Pertinent Vitals/Pain Pain Assessment Pain Assessment: No/denies pain    Home Living                          Prior Function             PT Goals (current goals can now be found in the care plan section) Acute Rehab PT Goals Patient Stated Goal: home Time For Goal Achievement: 10/27/23 Potential to Achieve Goals: Good Progress towards PT goals: Progressing toward goals    Frequency    Min 1X/week      PT Plan      Co-evaluation              AM-PAC PT "6 Clicks" Mobility   Outcome Measure  Help needed turning from your back to your side while in a flat bed without using bedrails?: None Help needed moving from lying on your back to sitting on the side of a flat bed without using bedrails?: None Help needed moving to and from a bed to a chair (including a wheelchair)?: A Little Help needed standing up from a chair using your arms (e.g., wheelchair or bedside chair)?: A Little Help needed to walk in hospital room?: A Little Help needed climbing 3-5 steps with a railing? : A Little 6 Click Score: 20    End of Session Equipment Utilized During Treatment: Gait belt;Oxygen Activity Tolerance: Patient tolerated treatment well Patient left: with call bell/phone within reach;with family/visitor present;in chair;with chair alarm set Nurse Communication: Mobility status;Other (comment) (updated dc plan) PT Visit Diagnosis: Unsteadiness on feet (R26.81)     Time: 5621-3086 PT Time Calculation (min) (ACUTE ONLY): 32 min  Charges:    $Gait Training: 23-37 mins PT General Charges $$ ACUTE PT VISIT: 1 Visit                      Jerolyn Center, PT Acute Rehabilitation Services  Office [phone]    Zena Amos 10/20/2023, 10:15 AM

## 2023-10-20 NOTE — Discharge Summary (Signed)
 Physician Discharge Summary   Joshua [name] DGL:[id number] DOB: Mar 20, 1936 DOA: 10/10/2023  PCP: [name], DO  Admit date: 10/10/2023 Discharge date:  10/20/2023  Admitted From: Home Disposition:  Home  Discharging physician: Lewie Chamber, MD Barriers to discharge: none   Recommendations at discharge: Continue chronic medical management    Discharge Condition: stable CODE STATUS: DNR Diet recommendation:  Diet Orders (From admission, onward)     Start     Ordered   10/20/23 0000  Diet - low sodium heart healthy        10/20/23 1344   10/17/23 0000  Diet - low sodium heart healthy        10/17/23 0933   10/14/23 0820  Diet Heart Room service appropriate? Yes; Fluid consistency: Thin; Fluid restriction: 1500 mL Fluid  Diet effective now       Question Answer Comment  Room service appropriate? Yes   Fluid consistency: Thin   Fluid restriction: 1500 mL Fluid      10/14/23 0819            Hospital Course: 88 year old male with chronic respiratory failure with hypoxia on 2-3L O2 at home, emphysema, pulmonary hypertension, chronic PE, CKD, aspiration pneumonia, chronic hypotension, OSA presented with worsening shortness of breath. On presentation, he was hypoxic and required BiPAP for oxygen saturations of 70s. He was febrile with leukocytosis and chest x-ray suggestive of possible pneumonia. He was started on broad-spectrum antibiotics. Because of persistent hypotension, he was started on pressors and admitted to ICU under PCCM service. Subsequently, vasopressors have been weaned off with improving blood pressure. Care has been transferred to [facility] service from 10/12/2023 onwards.   Assessment and Plan:  Septic shock: Present on admission Bilateral pneumonia with possibility of aspiration pneumonia -Presented with fever, leukocytosis with possible pneumonia and was persistently hypotensive requiring pressors and admission to ICU under PCCM service. -Cultures have been  negative so far.  Respiratory panel PCR negative.  Influenza/COVID/RSV PCR negative.  Off pressors.  Blood pressure stable -Care has been transferred to [facility] service from 10/12/2023 onwards. -Antibiotics have been switched to IV Unasyn. Course completed 2/27 -stable on heart diet    Acute on chronic hypoxic respiratory failure COPD/emphysema -Required BiPAP on presentation.   - on 2-3L chronic O2 at home - s/p solumedrol; completed prednisone course during hospitalization -Has now been weaned back down to home settings; no further need for LTAC   Chronic hypotension -BP stable on midodrine   Chronic diastolic heart failure Pulmonary hypertension -Echo showed EF of 65 to 70% with grade 1 diastolic dysfunction.  Continue strict input and output.  Daily weights.  Fluid restriction. -Outpatient follow-up with cardiology   Chronic PE -Continue Eliquis   AKI on CKD stage IIIa - baseline around 1.5 - 1.6; GFR ~ 40 -Creatinine peaked at 1.94 on admission -Now back to baseline   Leukocytosis -Resolved   Hypomagnesemia -Repleted   Lactic acidosis -Resolved   Thrombocytopenia -Resolved   Physical deconditioning -Reevaluated by physical therapy prior to discharge and performed well.  Considered okay for home with home health PT   Goals of care -Overall prognosis is guarded given respiratory status.  He clinically improved during hospitalization -Hold off on palliative care consult for now    Principal Diagnosis: Septic shock [facility])  Discharge Diagnoses: Active Hospital Problems   Diagnosis Date Noted   Acute on chronic respiratory failure with hypoxia (HCC) 10/10/2023    Priority: 1.   Community acquired bilateral lower lobe pneumonia  10/10/2023    Priority: 2.   Hypotension 05/20/2022    Priority: 2.   DMII (diabetes mellitus, type 2) (HCC) 10/15/2023    Priority: 4.   Stage 3b chronic kidney disease (CKD) (HCC) - baseline SCr 1.5-1.8 05/20/2022    Priority: 4.   DNR  (do not resuscitate) discussion 10/10/2023   Chronic diastolic CHF (congestive heart failure) (HCC)    Gastroesophageal reflux disease without esophagitis 10/05/2021   Benign prostatic hyperplasia with lower urinary tract symptoms 08/25/2017    Resolved Hospital Problems   Diagnosis Date Noted Date Resolved   Septic shock [facility]) 10/10/2023 10/15/2023    Priority: 3.     Discharge Instructions     Diet - low sodium heart healthy   Complete by: As directed    Diet - low sodium heart healthy   Complete by: As directed    Increase activity slowly   Complete by: As directed    Increase activity slowly   Complete by: As directed       Allergies as of 10/20/2023       Reactions   Other Swelling   Beer- Swelling    Sunflower Oil Swelling   Sulfa Antibiotics Rash        Medication List     TAKE these medications    Adempas 2 MG Tabs Generic drug: Riociguat TAKE 1 TABLET BY MOUTH 3 TIMES A DAY AS DIRECTED. DO NOT HANDLE IF PREGNANT   albuterol 108 (90 Base) MCG/ACT inhaler Commonly known as: VENTOLIN HFA Inhale 1 puff into the lungs every 4 (four) hours as needed for shortness of breath.   allopurinol 100 MG tablet Commonly known as: ZYLOPRIM Take 100 mg by mouth every morning.   atorvastatin 40 MG tablet Commonly known as: LIPITOR TAKE 1 TABLET BY MOUTH EVERY DAY   azelastine 0.1 % nasal spray Commonly known as: ASTELIN Place 2 sprays into both nostrils 2 (two) times daily as needed for rhinitis. Use in each nostril as directed   Cinnamon 500 MG capsule Take 500 mg by mouth every morning.   dapagliflozin propanediol 10 MG Tabs tablet Commonly known as: Farxiga Take 1 tablet (10 mg total) by mouth daily before breakfast.   Eliquis 5 MG Tabs tablet Generic drug: apixaban TAKE 1 TABLET BY MOUTH TWICE A DAY   finasteride 5 MG tablet Commonly known as: PROSCAR Take 5 mg by mouth at bedtime.   gabapentin 300 MG capsule Commonly known as: NEURONTIN Take 300  mg by mouth 3 (three) times daily.   magnesium oxide 400 (240 Mg) MG tablet Commonly known as: MAG-OX TAKE 1 TABLET BY MOUTH EVERY DAY   metoCLOPramide 5 MG tablet Commonly known as: REGLAN Take 5 mg by mouth 3 (three) times daily before meals.   midodrine 5 MG tablet Commonly known as: PROAMATINE TAKE 3 TABLETS BY MOUTH 3 TIMES DAILY. What changed: See the new instructions.   multivitamin with minerals Tabs tablet Take 1 tablet by mouth daily.   OXYGEN Inhale 2 L into the lungs continuous.   pantoprazole 40 MG tablet Commonly known as: PROTONIX Take 1 tablet (40 mg total) by mouth every morning.   potassium chloride 10 MEQ tablet Commonly known as: KLOR-CON Take 10 mEq by mouth 2 (two) times daily.   Stiolto Respimat 2.5-2.5 MCG/ACT Aers Generic drug: Tiotropium Bromide-Olodaterol Inhale 2 puffs into the lungs daily. What changed: how much to take   torsemide 20 MG tablet Commonly known as: DEMADEX Take 20 mg  by mouth daily.               Durable Medical Equipment  (From admission, onward)           Start     Ordered   10/20/23 1006  For home use only DME Walker rolling  Once       Question Answer Comment  Walker: With 5 Inch Wheels   Patient needs a walker to treat with the following condition Generalized muscle weakness      10/20/23 1005            Follow-up Information     Care, [facility] Follow up.   Specialty: Home Health Services Why: Home health has been arranged. They will contact you wihtin 48hrs post discharge. Contact information: [address] [place] [address] [phone]         [name], DO Follow up in 2 week(s).   Specialty: Family Medicine Why: Hospital follow up Contact information: [address] [place] [address] [phone]                Allergies  Allergen Reactions   Other Swelling    Beer- Swelling    Sunflower Oil Swelling   Sulfa  Antibiotics Rash    Discharge Exam: BP (!) 100/52   Pulse 63   Temp 97.6 F (36.4 C) (Oral)   Resp 20   Ht 6\' 3"  (1.905 m)   Wt 83.2 kg Comment: Scale A  SpO2 96%   BMI 22.92 kg/m  Physical Exam Constitutional:      General: He is not in acute distress.    Appearance: Normal appearance.  HENT:     Head: Normocephalic and atraumatic.     Mouth/Throat:     Mouth: Mucous membranes are moist.  Eyes:     Extraocular Movements: Extraocular movements intact.  Cardiovascular:     Rate and Rhythm: Normal rate and regular rhythm.  Pulmonary:     Effort: Pulmonary effort is normal. No respiratory distress.     Breath sounds: Normal breath sounds. No wheezing.  Abdominal:     General: Bowel sounds are normal. There is no distension.     Palpations: Abdomen is soft.     Tenderness: There is no abdominal tenderness.  Musculoskeletal:        General: Normal range of motion.     Cervical back: Normal range of motion and neck supple.  Skin:    General: Skin is warm and dry.  Neurological:     General: No focal deficit present.     Mental Status: He is alert.  Psychiatric:        Mood and Affect: Mood normal.        Behavior: Behavior normal.      The results of significant diagnostics from this hospitalization (including imaging, microbiology, ancillary and laboratory) are listed below for reference.   Microbiology: Recent Results (from the past 240 hours)  MRSA Next Gen by PCR, Nasal     Status: None   Collection Time: 10/10/23  2:45 PM   Specimen: Nasal Mucosa; Nasal Swab  Result Value Ref Range Status   MRSA by PCR Next Gen NOT DETECTED NOT DETECTED Final    Comment: (NOTE) The GeneXpert MRSA Assay (FDA approved for NASAL specimens only), is one component of a comprehensive MRSA colonization surveillance program. It is not intended to diagnose MRSA infection nor to guide or monitor treatment for MRSA  infections. Test performance is not FDA approved in patients less than  49 years old. Performed at [facility], [address] [place], [place] [address]   Expectorated Sputum Assessment w Gram Stain, Rflx to Resp Cult     Status: None   Collection Time: 10/10/23 11:25 PM   Specimen: Expectorated Sputum  Result Value Ref Range Status   Specimen Description EXPECTORATED SPUTUM  Final   Special Requests NONE  Final   Sputum evaluation   Final    THIS SPECIMEN IS ACCEPTABLE FOR SPUTUM CULTURE Performed at [facility], [address], [place], [place] [address]    Report Status 10/11/2023 FINAL  Final  Culture, Respiratory w Gram Stain     Status: None   Collection Time: 10/10/23 11:25 PM  Result Value Ref Range Status   Specimen Description EXPECTORATED SPUTUM  Final   Special Requests NONE Reflexed from [id number]  Final   Gram Stain   Final    FEW WBC PRESENT, PREDOMINANTLY PMN NO ORGANISMS SEEN    Culture   Final    FEW Normal respiratory flora-no Staph aureus or Pseudomonas seen Performed at [facility], [address] [place], [place] [address]    Report Status 10/13/2023 FINAL  Final     Labs: BNP (last 3 results) Recent Labs    03/13/23 1116 07/04/23 0922 10/10/23 0957  BNP 173.5* 149.9* 354.9*   Basic Metabolic Panel: Recent Labs  Lab 10/14/23 0158 10/15/23 0249  NA 140 141  K 3.8 4.1  CL 103 103  CO2 27 30  GLUCOSE 100* 142*  BUN 20 28*  CREATININE 1.18 1.21  CALCIUM 10.2 10.9*  MG 1.8 1.8   Liver Function Tests: No results for input(s): "AST", "ALT", "ALKPHOS", "BILITOT", "PROT", "ALBUMIN" in the last 168 hours.  No results for input(s): "LIPASE", "AMYLASE" in the last 168 hours. No results for input(s): "AMMONIA" in the last 168 hours. CBC: Recent Labs  Lab 10/14/23 0158 10/15/23 0249  WBC 7.3 9.4  NEUTROABS 5.6 8.1*  HGB 12.8* 13.9  HCT 39.5 42.3  MCV 83.7 82.9  PLT 162 196   Cardiac Enzymes: No results for input(s): "CKTOTAL", "CKMB", "CKMBINDEX", "TROPONINI" in the last 168  hours. BNP: Invalid input(s): "POCBNP" CBG: No results for input(s): "GLUCAP" in the last 168 hours.  D-Dimer No results for input(s): "DDIMER" in the last 72 hours. Hgb A1c No results for input(s): "HGBA1C" in the last 72 hours. Lipid Profile No results for input(s): "CHOL", "HDL", "LDLCALC", "TRIG", "CHOLHDL", "LDLDIRECT" in the last 72 hours. Thyroid function studies No results for input(s): "TSH", "T4TOTAL", "T3FREE", "THYROIDAB" in the last 72 hours.  Invalid input(s): "FREET3" Anemia work up No results for input(s): "VITAMINB12", "FOLATE", "FERRITIN", "TIBC", "IRON", "RETICCTPCT" in the last 72 hours. Urinalysis    Component Value Date/Time   COLORURINE YELLOW 10/10/2023 1417   APPEARANCEUR CLEAR 10/10/2023 1417   LABSPEC 1.013 10/10/2023 1417   PHURINE 5.0 10/10/2023 1417   GLUCOSEU >=500 (A) 10/10/2023 1417   HGBUR NEGATIVE 10/10/2023 1417   BILIRUBINUR NEGATIVE 10/10/2023 1417   KETONESUR NEGATIVE 10/10/2023 1417   PROTEINUR NEGATIVE 10/10/2023 1417   UROBILINOGEN 0.2 09/08/2014 0927   NITRITE NEGATIVE 10/10/2023 1417   LEUKOCYTESUR NEGATIVE 10/10/2023 1417   Sepsis Labs Recent Labs  Lab 10/14/23 0158 10/15/23 0249  WBC 7.3 9.4   Microbiology Recent Results (from the past 240 hours)  MRSA Next Gen by PCR, Nasal     Status: None  Collection Time: 10/10/23  2:45 PM   Specimen: Nasal Mucosa; Nasal Swab  Result Value Ref Range Status   MRSA by PCR Next Gen NOT DETECTED NOT DETECTED Final    Comment: (NOTE) The GeneXpert MRSA Assay (FDA approved for NASAL specimens only), is one component of a comprehensive MRSA colonization surveillance program. It is not intended to diagnose MRSA infection nor to guide or monitor treatment for MRSA infections. Test performance is not FDA approved in patients less than 37 years old. Performed at [facility], [address], [place], [place] [address]   Expectorated Sputum Assessment w Gram Stain, Rflx to Resp Cult      Status: None   Collection Time: 10/10/23 11:25 PM   Specimen: Expectorated Sputum  Result Value Ref Range Status   Specimen Description EXPECTORATED SPUTUM  Final   Special Requests NONE  Final   Sputum evaluation   Final    THIS SPECIMEN IS ACCEPTABLE FOR SPUTUM CULTURE Performed at [facility], [address], [place], [place] [address]    Report Status 10/11/2023 FINAL  Final  Culture, Respiratory w Gram Stain     Status: None   Collection Time: 10/10/23 11:25 PM  Result Value Ref Range Status   Specimen Description EXPECTORATED SPUTUM  Final   Special Requests NONE Reflexed from [id number]  Final   Gram Stain   Final    FEW WBC PRESENT, PREDOMINANTLY PMN NO ORGANISMS SEEN    Culture   Final    FEW Normal respiratory flora-no Staph aureus or Pseudomonas seen Performed at [facility], [address] [place], [place] [address]    Report Status 10/13/2023 FINAL  Final    Procedures/Studies: DG CHEST PORT 1 VIEW Result Date: 10/13/2023 CLINICAL DATA:  Dyspnea EXAM: PORTABLE CHEST 1 VIEW COMPARISON:  10/10/2023 FINDINGS: Cardiomegaly. Aortic atherosclerosis. Patchy bilateral airspace opacities, progressed within the left perihilar and left basilar region. Improved aeration at the right lung base. No pleural effusion or pneumothorax. IMPRESSION: Patchy bilateral airspace opacities, progressed within the left perihilar and left basilar region. Improved aeration at the right lung base. Electronically Signed   By: Duanne Guess D.O.   On: 10/13/2023 14:42   ECHOCARDIOGRAM COMPLETE Result Date: 10/11/2023    ECHOCARDIOGRAM REPORT   Patient Name:   KC SUMMERSON Date of Exam: 10/11/2023 Medical Rec #:  [id number]        Height:       75.0 in Accession #:    [id number]       Weight:       229.9 lb Date of Birth:  [birth year]         BSA:          2.328 m Patient Age:    87 years         BP:           97/56 mmHg Patient Gender: M                HR:           90 bpm. Exam  Location:  Inpatient Procedure: 2D Echo, Cardiac Doppler and Color Doppler (Both Spectral and Color            Flow Doppler were utilized during procedure). Indications:    CHF- Acute Diastolic  History:        Patient has prior history of Echocardiogram examinations, most  recent 01/09/2023. COPD; Risk Factors:Sleep Apnea and Former                 Smoker.  Sonographer:    Karma Ganja Referring Phys: GRACE E BOWSER IMPRESSIONS  1. Left ventricular ejection fraction, by estimation, is 60 to 65%. The left ventricle has normal function. The left ventricle has no regional wall motion abnormalities. Left ventricular diastolic parameters are consistent with Grade I diastolic dysfunction (impaired relaxation).  2. D shaped septum suggesting cor pulmonale. Right ventricular systolic function is severely reduced. The right ventricular size is severely enlarged. There is severely elevated pulmonary artery systolic pressure.  3. Right atrial size was moderately dilated.  4. The mitral valve is normal in structure. No evidence of mitral valve regurgitation. No evidence of mitral stenosis.  5. Tricuspid valve regurgitation is mild to moderate.  6. The aortic valve is tricuspid. There is mild calcification of the aortic valve. There is mild thickening of the aortic valve. Aortic valve regurgitation is trivial. Aortic valve sclerosis is present, with no evidence of aortic valve stenosis.  7. Pulmonic valve regurgitation is moderate.  8. There is borderline dilatation of the aortic root, measuring 37 mm.  9. The inferior vena cava is dilated in size with <50% respiratory variability, suggesting right atrial pressure of 15 mmHg. FINDINGS  Left Ventricle: Left ventricular ejection fraction, by estimation, is 60 to 65%. The left ventricle has normal function. The left ventricle has no regional wall motion abnormalities. Strain imaging was not performed. The left ventricular internal cavity  size was normal in size.  There is no left ventricular hypertrophy. Left ventricular diastolic parameters are consistent with Grade I diastolic dysfunction (impaired relaxation). Right Ventricle: D shaped septum suggesting cor pulmonale. The right ventricular size is severely enlarged. No increase in right ventricular wall thickness. Right ventricular systolic function is severely reduced. There is severely elevated pulmonary artery systolic pressure. The tricuspid regurgitant velocity is 3.68 m/s, and with an assumed right atrial pressure of 15 mmHg, the estimated right ventricular systolic pressure is 69.2 mmHg. Left Atrium: Left atrial size was normal in size. Right Atrium: Right atrial size was moderately dilated. Pericardium: There is no evidence of pericardial effusion. Mitral Valve: The mitral valve is normal in structure. No evidence of mitral valve regurgitation. No evidence of mitral valve stenosis. Tricuspid Valve: The tricuspid valve is normal in structure. Tricuspid valve regurgitation is mild to moderate. No evidence of tricuspid stenosis. Aortic Valve: The aortic valve is tricuspid. There is mild calcification of the aortic valve. There is mild thickening of the aortic valve. Aortic valve regurgitation is trivial. Aortic valve sclerosis is present, with no evidence of aortic valve stenosis. Aortic valve mean gradient measures 3.0 mmHg. Aortic valve peak gradient measures 5.3 mmHg. Aortic valve area, by VTI measures 3.09 cm. Pulmonic Valve: The pulmonic valve was normal in structure. Pulmonic valve regurgitation is moderate. No evidence of pulmonic stenosis. Aorta: The aortic root is normal in size and structure. There is borderline dilatation of the aortic root, measuring 37 mm. Venous: The inferior vena cava is dilated in size with less than 50% respiratory variability, suggesting right atrial pressure of 15 mmHg. IAS/Shunts: No atrial level shunt detected by color flow Doppler. Additional Comments: 3D imaging was not  performed.  LEFT VENTRICLE PLAX 2D LVIDd:         3.80 cm   Diastology LVIDs:         2.40 cm   LV e' medial:  6.31 cm/s LV PW:         1.00 cm   LV E/e' medial:  7.9 LV IVS:        0.90 cm   LV e' lateral:   5.44 cm/s LVOT diam:     2.20 cm   LV E/e' lateral: 9.2 LV SV:         61 LV SV Index:   26 LVOT Area:     3.80 cm  RIGHT VENTRICLE RV Basal diam:  6.20 cm RV S prime:     15.00 cm/s TAPSE (M-mode): 2.2 cm LEFT ATRIUM           Index        RIGHT ATRIUM           Index LA diam:      3.20 cm 1.37 cm/m   RA Area:     28.40 cm LA Vol (A2C): 59.7 ml 25.64 ml/m  RA Volume:   97.40 ml  41.83 ml/m LA Vol (A4C): 28.8 ml 12.37 ml/m  AORTIC VALVE AV Area (Vmax):    3.34 cm AV Area (Vmean):   3.05 cm AV Area (VTI):     3.09 cm AV Vmax:           115.00 cm/s AV Vmean:          77.000 cm/s AV VTI:            0.197 m AV Peak Grad:      5.3 mmHg AV Mean Grad:      3.0 mmHg LVOT Vmax:         101.00 cm/s LVOT Vmean:        61.800 cm/s LVOT VTI:          0.160 m LVOT/AV VTI ratio: 0.81  AORTA Ao Root diam: 3.70 cm MITRAL VALVE               TRICUSPID VALVE MV Area (PHT): 3.50 cm    TR Peak grad:   54.2 mmHg MV Decel Time: 217 msec    TR Vmax:        368.00 cm/s MV E velocity: 49.80 cm/s MV A velocity: 82.30 cm/s  SHUNTS MV E/A ratio:  0.61        Systemic VTI:  0.16 m                            Systemic Diam: 2.20 cm Charlton Haws MD Electronically signed by Charlton Haws MD Signature Date/Time: 10/11/2023/2:56:58 PM    Final    DG Chest Portable 1 View Result Date: 10/10/2023 CLINICAL DATA:  Shortness of breath EXAM: PORTABLE CHEST 1 VIEW COMPARISON:  Chest radiograph dated 12/02/2022 FINDINGS: Normal lung volumes. Confluent bibasilar opacities, right-greater-than-left. No pleural effusion or pneumothorax. Enlarged cardiomediastinal silhouette is likely projectional. No acute osseous abnormality. IMPRESSION: Confluent bibasilar opacities, right-greater-than-left, which may represent atelectasis, aspiration, or  pneumonia. Electronically Signed   By: Agustin Cree M.D.   On: 10/10/2023 10:22     Time coordinating discharge: Over 30 minutes    Lewie Chamber, MD  [facility] 10/20/2023, 1:44 PM

## 2023-10-20 NOTE — TOC Transition Note (Signed)
 Transition of Care [facility]) - Discharge Note   Patient Details  Name: Joshua [name] MRN: [id number] Date of Birth: 02/26/1936  Transition of Care [facility]) CM/SW Contact:  Harriet Masson, RN Phone Number: 10/20/2023, 1:42 PM   Clinical Narrative:      Orders for home health. Patient doesn't want to use [facility] that he has used in the past due to his in and out catheter supplies not being delivered.  This RNCM offered choice for Home  Health, Patient states she has no preference, RNCM made referral to [facility] with Frances Furbish, He is able to take referral.  Wife will transport home on home 02 that he has at baseline.    Final next level of care: Home w Home Health Services Barriers to Discharge: Barriers Resolved   Patient Goals and CMS Choice Patient states their goals for this hospitalization and ongoing recovery are:: return home CMS Medicare.gov Compare Post Acute Care list provided to:: Patient Choice offered to / list presented to : Patient      Discharge Placement             Home          Discharge Plan and Services Additional resources added to the After Visit Summary for     Discharge Planning Services: CM Consult Post Acute Care Choice: Skilled Nursing Facility                    HH Arranged: PT, RN [facility] Agency: [facility] Date [facility] Agency Contacted: 10/20/23 Time HH Agency Contacted: 1337 Representative spoke with at [facility] Agency: Kandee Keen  Social Drivers of Health (SDOH) Interventions SDOH Screenings   Food Insecurity: No Food Insecurity (10/10/2023)  Housing: Low Risk  (10/12/2023)  Transportation Needs: Unmet Transportation Needs (10/10/2023)  Utilities: Not At Risk (10/10/2023)  Depression (PHQ2-9): Low Risk  (03/13/2022)  Social Connections: Socially Integrated (10/10/2023)  Stress: No Stress Concern Present (05/09/2021)  Tobacco Use: Medium Risk (10/10/2023)     Readmission Risk Interventions    10/20/2023    1:41 PM 10/05/2021    3:15 PM   Readmission Risk Prevention Plan  Post Dischage Appt Complete   Medication Screening Complete   Transportation Screening Complete Complete  Medication Review (RN Care Manager)  Complete  PCP or Specialist appointment within 3-5 days of discharge  Complete  HRI or Home Care Consult  Complete  SW Recovery Care/Counseling Consult  Complete  Palliative Care Screening  Not Applicable  Skilled Nursing Facility  Not Applicable

## 2023-10-20 NOTE — Progress Notes (Signed)
 Occupational Therapy Treatment Patient Details Name: Joshua [name] MRN: [id number] DOB: Dec 28, 1935 Today's Date: 10/20/2023   History of present illness 88 year old male who presented with worsening shortness of breath PMH: chronic respiratory failure with hypoxia, emphysema, pulmonary hypertension, chronic PE, CKD, aspiration pneumonia, chronic hypotension, OSA   OT comments  Patient seen for reassessment per MD request. Patient now on 3L of O2, with stable saturations. Patient with improved strength in session, and able to complete sit<>stands and march in place. Patient able to complete at [facility], but continues to require increased cues in order to mangage breathing to promote decreased RR. Education provided with regard to energy conservation throughout, with daughter present for session and OT also providing education with regard to discharge planning. OT recommendation updated to [facility], OT will follow acutely.       If plan is discharge home, recommend the following:  A little help with walking and/or transfers;A little help with bathing/dressing/bathroom;Assistance with cooking/housework;Assist for transportation;Help with stairs or ramp for entrance   Equipment Recommendations  None recommended by OT    Recommendations for Other Services      Precautions / Restrictions Precautions Precautions: Fall Precaution/Restrictions Comments: watch SpO2 Restrictions Weight Bearing Restrictions Per Provider Order: No       Mobility Bed Mobility Overal bed mobility: Needs Assistance             General bed mobility comments: up in recliner upon entry    Transfers Overall transfer level: Needs assistance Equipment used: Rolling walker (2 wheels) Transfers: Sit to/from Stand, Bed to chair/wheelchair/BSC Sit to Stand: Contact guard assist           General transfer comment: CGA from recliner x 3 reps     Balance Overall balance assessment: Needs assistance Sitting-balance  support: Feet supported, No upper extremity supported Sitting balance-Leahy Scale: Fair     Standing balance support: During functional activity, Reliant on assistive device for balance, No upper extremity supported Standing balance-Leahy Scale: Fair Standing balance comment: No overt LOB                           ADL either performed or assessed with clinical judgement   ADL Overall ADL's : Needs assistance/impaired     Grooming: Set up;Sitting       Lower Body Bathing: Minimal assistance;Sit to/from stand;Sitting/lateral leans   Upper Body Dressing : Set up;Sitting   Lower Body Dressing: Minimal assistance;Sitting/lateral leans;Sit to/from stand   Toilet Transfer: Minimal assistance;Ambulation;Rolling walker (2 wheels)   Toileting- Clothing Manipulation and Hygiene: Minimal assistance;Sit to/from stand;Sitting/lateral lean       Functional mobility during ADLs: Minimal assistance;Cueing for safety;Cueing for sequencing;Rolling walker (2 wheels) General ADL Comments: Patient seen for reassessment per MD request. Patient now on 2L of O2, with stable saturations. Patient with improved strength in session, and able to complete sit<>stands and march in place. Patient able to complete at [facility], but continues to require increased cues in order to mangage breathing to promote decreased RR. Education provided with regard to energy conservation throughout, with daughter present for session and OT also providing education with regard to discharge planning. OT recommendation updated to [facility], OT will follow acutely.    Extremity/Trunk Assessment Upper Extremity Assessment Upper Extremity Assessment: Overall WFL for tasks assessed            Vision   Vision Assessment?: No apparent visual deficits   Perception Perception Perception: Not tested  Praxis Praxis Praxis: Not tested   Communication Communication Communication: No apparent difficulties   Cognition  Arousal: Alert Behavior During Therapy: WFL for tasks assessed/performed                                 Following commands: Intact        Cueing   Cueing Techniques: Verbal cues  Exercises      Shoulder Instructions       General Comments VSS on 2L >92% with good pleth    Pertinent Vitals/ Pain       Pain Assessment Pain Assessment: No/denies pain  Home Living                                          Prior Functioning/Environment              Frequency  Min 1X/week        Progress Toward Goals  OT Goals(current goals can now be found in the care plan section)  Progress towards OT goals: Progressing toward goals  Acute Rehab OT Goals Patient Stated Goal: to go home OT Goal Formulation: With patient Time For Goal Achievement: 10/27/23 Potential to Achieve Goals: Good  Plan      Co-evaluation                 AM-PAC OT "6 Clicks" Daily Activity     Outcome Measure   Help from another person eating meals?: None Help from another person taking care of personal grooming?: A Little Help from another person toileting, which includes using toliet, bedpan, or urinal?: A Little Help from another person bathing (including washing, rinsing, drying)?: A Little Help from another person to put on and taking off regular upper body clothing?: A Little Help from another person to put on and taking off regular lower body clothing?: A Little 6 Click Score: 19    End of Session Equipment Utilized During Treatment: Oxygen  OT Visit Diagnosis: Unsteadiness on feet (R26.81);Muscle weakness (generalized) (M62.81);Other (comment)   Activity Tolerance Patient tolerated treatment well   Patient Left in chair;with call bell/phone within reach;with family/visitor present;with chair alarm set   Nurse Communication Mobility status        Time: 4098-1191 OT Time Calculation (min): 18 min  Charges: OT General Charges $OT Visit: 1  Visit OT Treatments $Self Care/Home Management : 8-22 mins  Pollyann Glen E. Neriah Brott, OTR/L Acute Rehabilitation Services [phone]   Cherlyn Cushing 10/20/2023, 1:23 PM

## 2023-10-25 ENCOUNTER — Emergency Department (HOSPITAL_COMMUNITY)

## 2023-10-25 ENCOUNTER — Other Ambulatory Visit: Payer: Self-pay

## 2023-10-25 ENCOUNTER — Inpatient Hospital Stay (HOSPITAL_COMMUNITY)
Admission: EM | Admit: 2023-10-25 | Discharge: 2023-10-28 | DRG: 871 | Disposition: A | Discharge status: Home Health | Attending: Internal Medicine | Admitting: Internal Medicine

## 2023-10-25 DIAGNOSIS — N179 Acute kidney failure, unspecified: Secondary | ICD-10-CM

## 2023-10-25 DIAGNOSIS — N1831 Chronic kidney disease, stage 3a: Secondary | ICD-10-CM | POA: Diagnosis present

## 2023-10-25 DIAGNOSIS — E876 Hypokalemia: Secondary | ICD-10-CM | POA: Diagnosis not present

## 2023-10-25 DIAGNOSIS — I2781 Cor pulmonale (chronic): Secondary | ICD-10-CM | POA: Diagnosis present

## 2023-10-25 DIAGNOSIS — Z8616 Personal history of COVID-19: Secondary | ICD-10-CM | POA: Diagnosis not present

## 2023-10-25 DIAGNOSIS — I5030 Unspecified diastolic (congestive) heart failure: Secondary | ICD-10-CM | POA: Diagnosis not present

## 2023-10-25 DIAGNOSIS — Z1152 Encounter for screening for COVID-19: Secondary | ICD-10-CM | POA: Diagnosis not present

## 2023-10-25 DIAGNOSIS — J439 Emphysema, unspecified: Secondary | ICD-10-CM | POA: Diagnosis present

## 2023-10-25 DIAGNOSIS — I2782 Chronic pulmonary embolism: Secondary | ICD-10-CM | POA: Diagnosis present

## 2023-10-25 DIAGNOSIS — E785 Hyperlipidemia, unspecified: Secondary | ICD-10-CM | POA: Diagnosis present

## 2023-10-25 DIAGNOSIS — Z91018 Allergy to other foods: Secondary | ICD-10-CM

## 2023-10-25 DIAGNOSIS — R0602 Shortness of breath: Secondary | ICD-10-CM | POA: Diagnosis not present

## 2023-10-25 DIAGNOSIS — Z7409 Other reduced mobility: Secondary | ICD-10-CM | POA: Diagnosis present

## 2023-10-25 DIAGNOSIS — I130 Hypertensive heart and chronic kidney disease with heart failure and stage 1 through stage 4 chronic kidney disease, or unspecified chronic kidney disease: Secondary | ICD-10-CM | POA: Diagnosis not present

## 2023-10-25 DIAGNOSIS — Z8701 Personal history of pneumonia (recurrent): Secondary | ICD-10-CM

## 2023-10-25 DIAGNOSIS — A419 Sepsis, unspecified organism: Principal | ICD-10-CM | POA: Diagnosis present

## 2023-10-25 DIAGNOSIS — I252 Old myocardial infarction: Secondary | ICD-10-CM

## 2023-10-25 DIAGNOSIS — Z9049 Acquired absence of other specified parts of digestive tract: Secondary | ICD-10-CM

## 2023-10-25 DIAGNOSIS — Z66 Do not resuscitate: Secondary | ICD-10-CM | POA: Diagnosis not present

## 2023-10-25 DIAGNOSIS — R338 Other retention of urine: Secondary | ICD-10-CM | POA: Diagnosis present

## 2023-10-25 DIAGNOSIS — K449 Diaphragmatic hernia without obstruction or gangrene: Secondary | ICD-10-CM | POA: Diagnosis present

## 2023-10-25 DIAGNOSIS — R0902 Hypoxemia: Secondary | ICD-10-CM | POA: Diagnosis not present

## 2023-10-25 DIAGNOSIS — I2721 Secondary pulmonary arterial hypertension: Secondary | ICD-10-CM | POA: Diagnosis not present

## 2023-10-25 DIAGNOSIS — R000 Tachycardia, unspecified: Secondary | ICD-10-CM | POA: Diagnosis not present

## 2023-10-25 DIAGNOSIS — E1140 Type 2 diabetes mellitus with diabetic neuropathy, unspecified: Secondary | ICD-10-CM | POA: Diagnosis not present

## 2023-10-25 DIAGNOSIS — E1122 Type 2 diabetes mellitus with diabetic chronic kidney disease: Secondary | ICD-10-CM | POA: Diagnosis not present

## 2023-10-25 DIAGNOSIS — N401 Enlarged prostate with lower urinary tract symptoms: Secondary | ICD-10-CM | POA: Diagnosis present

## 2023-10-25 DIAGNOSIS — I272 Pulmonary hypertension, unspecified: Secondary | ICD-10-CM | POA: Diagnosis not present

## 2023-10-25 DIAGNOSIS — J9621 Acute and chronic respiratory failure with hypoxia: Secondary | ICD-10-CM | POA: Diagnosis present

## 2023-10-25 DIAGNOSIS — Z87891 Personal history of nicotine dependence: Secondary | ICD-10-CM

## 2023-10-25 DIAGNOSIS — J690 Pneumonitis due to inhalation of food and vomit: Secondary | ICD-10-CM | POA: Diagnosis present

## 2023-10-25 DIAGNOSIS — J189 Pneumonia, unspecified organism: Secondary | ICD-10-CM | POA: Diagnosis not present

## 2023-10-25 DIAGNOSIS — R6521 Severe sepsis with septic shock: Secondary | ICD-10-CM | POA: Diagnosis present

## 2023-10-25 DIAGNOSIS — R918 Other nonspecific abnormal finding of lung field: Secondary | ICD-10-CM | POA: Diagnosis not present

## 2023-10-25 DIAGNOSIS — I5032 Chronic diastolic (congestive) heart failure: Secondary | ICD-10-CM | POA: Diagnosis present

## 2023-10-25 DIAGNOSIS — J181 Lobar pneumonia, unspecified organism: Secondary | ICD-10-CM | POA: Diagnosis not present

## 2023-10-25 DIAGNOSIS — Z882 Allergy status to sulfonamides status: Secondary | ICD-10-CM

## 2023-10-25 DIAGNOSIS — Z96652 Presence of left artificial knee joint: Secondary | ICD-10-CM | POA: Diagnosis present

## 2023-10-25 DIAGNOSIS — J440 Chronic obstructive pulmonary disease with acute lower respiratory infection: Secondary | ICD-10-CM | POA: Diagnosis present

## 2023-10-25 DIAGNOSIS — R509 Fever, unspecified: Secondary | ICD-10-CM | POA: Diagnosis not present

## 2023-10-25 DIAGNOSIS — R911 Solitary pulmonary nodule: Secondary | ICD-10-CM | POA: Diagnosis not present

## 2023-10-25 DIAGNOSIS — I50810 Right heart failure, unspecified: Secondary | ICD-10-CM | POA: Diagnosis not present

## 2023-10-25 DIAGNOSIS — Z7901 Long term (current) use of anticoagulants: Secondary | ICD-10-CM

## 2023-10-25 DIAGNOSIS — R0689 Other abnormalities of breathing: Secondary | ICD-10-CM | POA: Diagnosis not present

## 2023-10-25 DIAGNOSIS — Z79899 Other long term (current) drug therapy: Secondary | ICD-10-CM

## 2023-10-25 DIAGNOSIS — K219 Gastro-esophageal reflux disease without esophagitis: Secondary | ICD-10-CM | POA: Diagnosis present

## 2023-10-25 DIAGNOSIS — Z8249 Family history of ischemic heart disease and other diseases of the circulatory system: Secondary | ICD-10-CM

## 2023-10-25 DIAGNOSIS — Z9981 Dependence on supplemental oxygen: Secondary | ICD-10-CM | POA: Diagnosis not present

## 2023-10-25 DIAGNOSIS — Z8672 Personal history of thrombophlebitis: Secondary | ICD-10-CM

## 2023-10-25 DIAGNOSIS — R6520 Severe sepsis without septic shock: Secondary | ICD-10-CM | POA: Diagnosis not present

## 2023-10-25 HISTORY — DX: Sepsis, unspecified organism: A41.9

## 2023-10-25 LAB — CBC WITH DIFFERENTIAL/PLATELET
Abs Immature Granulocytes: 0.07 10*3/uL (ref 0.00–0.07)
Basophils Absolute: 0 10*3/uL (ref 0.0–0.1)
Basophils Relative: 0 %
Eosinophils Absolute: 0 10*3/uL (ref 0.0–0.5)
Eosinophils Relative: 0 %
HCT: 39.7 % (ref 39.0–52.0)
Hemoglobin: 12.6 g/dL — ABNORMAL LOW (ref 13.0–17.0)
Immature Granulocytes: 1 %
Lymphocytes Relative: 4 %
Lymphs Abs: 0.5 10*3/uL — ABNORMAL LOW (ref 0.7–4.0)
MCH: 27.2 pg (ref 26.0–34.0)
MCHC: 31.7 g/dL (ref 30.0–36.0)
MCV: 85.7 fL (ref 80.0–100.0)
Monocytes Absolute: 1 10*3/uL (ref 0.1–1.0)
Monocytes Relative: 8 %
Neutro Abs: 11.4 10*3/uL — ABNORMAL HIGH (ref 1.7–7.7)
Neutrophils Relative %: 87 %
Platelets: 235 10*3/uL (ref 150–400)
RBC: 4.63 MIL/uL (ref 4.22–5.81)
RDW: 17.4 % — ABNORMAL HIGH (ref 11.5–15.5)
WBC: 13 10*3/uL — ABNORMAL HIGH (ref 4.0–10.5)
nRBC: 0 % (ref 0.0–0.2)

## 2023-10-25 LAB — RESPIRATORY PANEL BY PCR

## 2023-10-25 LAB — COMPREHENSIVE METABOLIC PANEL
ALT: 21 U/L (ref 0–44)
AST: 29 U/L (ref 15–41)
Albumin: 3 g/dL — ABNORMAL LOW (ref 3.5–5.0)
Alkaline Phosphatase: 74 U/L (ref 38–126)
Anion gap: 11 (ref 5–15)
BUN: 23 mg/dL (ref 8–23)
CO2: 32 mmol/L (ref 22–32)
Calcium: 9.4 mg/dL (ref 8.9–10.3)
Chloride: 97 mmol/L — ABNORMAL LOW (ref 98–111)
Creatinine, Ser: 1.54 mg/dL — ABNORMAL HIGH (ref 0.61–1.24)
GFR, Estimated: 44 mL/min — ABNORMAL LOW (ref >60)
Glucose, Bld: 102 mg/dL — ABNORMAL HIGH (ref 70–99)
Potassium: 4 mmol/L (ref 3.5–5.1)
Sodium: 140 mmol/L (ref 135–145)
Total Bilirubin: 0.8 mg/dL (ref 0.0–1.2)
Total Protein: 6.2 g/dL — ABNORMAL LOW (ref 6.5–8.1)

## 2023-10-25 LAB — URINALYSIS, W/ REFLEX TO CULTURE (INFECTION SUSPECTED)
Bacteria, UA: NONE SEEN
Bilirubin Urine: NEGATIVE
Glucose, UA: >=500 mg/dL — AB
Hgb urine dipstick: NEGATIVE
Ketones, ur: NEGATIVE mg/dL
Leukocytes,Ua: NEGATIVE
Nitrite: NEGATIVE
Protein, ur: NEGATIVE mg/dL
Specific Gravity, Urine: 1.018 (ref 1.005–1.030)
pH: 6 (ref 5.0–8.0)

## 2023-10-25 LAB — RESP PANEL BY RT-PCR (RSV, FLU A&B, COVID)  RVPGX2
Influenza A by PCR: NEGATIVE
Influenza B by PCR: NEGATIVE
Resp Syncytial Virus by PCR: NEGATIVE
SARS Coronavirus 2 by RT PCR: NEGATIVE

## 2023-10-25 LAB — PROCALCITONIN: Procalcitonin: 1.85 ng/mL

## 2023-10-25 LAB — PROTIME-INR
INR: 1.6 — ABNORMAL HIGH (ref 0.8–1.2)
Prothrombin Time: 19.6 s — ABNORMAL HIGH (ref 11.4–15.2)

## 2023-10-25 LAB — MRSA NEXT GEN BY PCR, NASAL: MRSA by PCR Next Gen: NOT DETECTED

## 2023-10-25 LAB — I-STAT CG4 LACTIC ACID, ED
Lactic Acid, Venous: 2.7 mmol/L (ref 0.5–1.9)
Lactic Acid, Venous: 2 mmol/L (ref 0.5–1.9)

## 2023-10-25 LAB — GLUCOSE, CAPILLARY: Glucose-Capillary: 81 mg/dL (ref 70–99)

## 2023-10-25 LAB — APTT: aPTT: 29 s (ref 24–36)

## 2023-10-25 MED ORDER — SODIUM CHLORIDE 0.9 % IV SOLN
2.0000 g | Freq: Two times a day (BID) | INTRAVENOUS | Status: DC
  Administered 2023-10-26 – 2023-10-28 (×5): 2 g via INTRAVENOUS
  Filled 2023-10-25 (×5): qty 12.5

## 2023-10-25 MED ORDER — NOREPINEPHRINE 4 MG/250ML-% IV SOLN
2.0000 ug/min | INTRAVENOUS | Status: DC
  Administered 2023-10-25: 2 ug/min via INTRAVENOUS
  Filled 2023-10-25: qty 250

## 2023-10-25 MED ORDER — VANCOMYCIN HCL IN DEXTROSE 1-5 GM/200ML-% IV SOLN: 1000.0000 mg | INTRAVENOUS | Status: DC

## 2023-10-25 MED ORDER — MIDODRINE HCL 5 MG PO TABS: 5.0000 mg | ORAL_TABLET | Freq: Once | ORAL | Status: DC

## 2023-10-25 MED ORDER — APIXABAN 5 MG PO TABS
5.0000 mg | ORAL_TABLET | Freq: Two times a day (BID) | ORAL | Status: DC
  Administered 2023-10-25 – 2023-10-28 (×6): 5 mg via ORAL
  Filled 2023-10-25 (×6): qty 1

## 2023-10-25 MED ORDER — RIOCIGUAT 2 MG PO TABS
2.0000 mg | ORAL_TABLET | Freq: Three times a day (TID) | ORAL | Status: DC
  Administered 2023-10-25 – 2023-10-28 (×7): 2 mg via ORAL
  Filled 2023-10-25 (×15): qty 1

## 2023-10-25 MED ORDER — UMECLIDINIUM BROMIDE 62.5 MCG/ACT IN AEPB
1.0000 | INHALATION_SPRAY | Freq: Every day | RESPIRATORY_TRACT | Status: DC
  Administered 2023-10-25 – 2023-10-28 (×4): 1 via RESPIRATORY_TRACT
  Filled 2023-10-25: qty 7

## 2023-10-25 MED ORDER — BUDESONIDE 0.5 MG/2ML IN SUSP: 0.5000 mg | Freq: Two times a day (BID) | RESPIRATORY_TRACT | Status: DC

## 2023-10-25 MED ORDER — PANTOPRAZOLE SODIUM 40 MG IV SOLR: 40.0000 mg | INTRAVENOUS | Status: DC

## 2023-10-25 MED ORDER — ENSURE ENLIVE PO LIQD
237.0000 mL | Freq: Two times a day (BID) | ORAL | Status: DC
  Administered 2023-10-26 – 2023-10-28 (×3): 237 mL via ORAL
  Filled 2023-10-25: qty 237

## 2023-10-25 MED ORDER — ADULT MULTIVITAMIN W/MINERALS CH
1.0000 | ORAL_TABLET | Freq: Every day | ORAL | Status: DC
  Administered 2023-10-27 – 2023-10-28 (×2): 1 via ORAL
  Filled 2023-10-25 (×2): qty 1

## 2023-10-25 MED ORDER — ATORVASTATIN CALCIUM 40 MG PO TABS
40.0000 mg | ORAL_TABLET | Freq: Every day | ORAL | Status: DC
  Administered 2023-10-27 – 2023-10-28 (×2): 40 mg via ORAL
  Filled 2023-10-25 (×2): qty 1

## 2023-10-25 MED ORDER — GABAPENTIN 100 MG PO CAPS
100.0000 mg | ORAL_CAPSULE | Freq: Three times a day (TID) | ORAL | Status: DC
Start: 2023-10-25 — End: 2023-10-28
  Administered 2023-10-25 – 2023-10-28 (×7): 100 mg via ORAL
  Filled 2023-10-25 (×7): qty 1

## 2023-10-25 MED ORDER — PIPERACILLIN-TAZOBACTAM 3.375 G IVPB: 3.3750 g | Freq: Three times a day (TID) | INTRAVENOUS | Status: DC

## 2023-10-25 MED ORDER — FINASTERIDE 5 MG PO TABS
5.0000 mg | ORAL_TABLET | Freq: Every day | ORAL | Status: DC
  Administered 2023-10-26 – 2023-10-27 (×3): 5 mg via ORAL
  Filled 2023-10-25 (×4): qty 1

## 2023-10-25 MED ORDER — ARFORMOTEROL TARTRATE 15 MCG/2ML IN NEBU
15.0000 ug | INHALATION_SOLUTION | Freq: Two times a day (BID) | RESPIRATORY_TRACT | Status: DC
  Administered 2023-10-25 – 2023-10-27 (×5): 15 ug via RESPIRATORY_TRACT
  Filled 2023-10-25 (×5): qty 2

## 2023-10-25 MED ORDER — RIOCIGUAT 2 MG PO TABS: 2.0000 mg | ORAL_TABLET | Freq: Three times a day (TID) | ORAL | Status: DC

## 2023-10-25 MED ORDER — SODIUM CHLORIDE 0.9 % IV SOLN: 250.0000 mL | INTRAVENOUS | Status: DC

## 2023-10-25 MED ORDER — MIDODRINE HCL 5 MG PO TABS: 10.0000 mg | ORAL_TABLET | Freq: Once | ORAL | Status: DC

## 2023-10-25 MED ORDER — ALLOPURINOL 100 MG PO TABS
100.0000 mg | ORAL_TABLET | Freq: Every morning | ORAL | Status: DC
  Administered 2023-10-27 – 2023-10-28 (×2): 100 mg via ORAL
  Filled 2023-10-25 (×3): qty 1

## 2023-10-25 MED ORDER — VANCOMYCIN HCL 1500 MG/300ML IV SOLN
1500.0000 mg | Freq: Once | INTRAVENOUS | Status: AC
  Administered 2023-10-25: 1500 mg via INTRAVENOUS
  Filled 2023-10-25: qty 300

## 2023-10-25 MED ORDER — REVEFENACIN 175 MCG/3ML IN SOLN: 175.0000 ug | Freq: Every day | RESPIRATORY_TRACT | Status: DC

## 2023-10-25 MED ORDER — SODIUM CHLORIDE 0.9 % IV BOLUS
1000.0000 mL | Freq: Once | INTRAVENOUS | Status: AC
  Administered 2023-10-25: 1000 mL via INTRAVENOUS

## 2023-10-25 MED ORDER — DOCUSATE SODIUM 100 MG PO CAPS: 100.0000 mg | ORAL_CAPSULE | Freq: Two times a day (BID) | ORAL | Status: DC | PRN

## 2023-10-25 MED ORDER — ARFORMOTEROL TARTRATE 15 MCG/2ML IN NEBU: 15.0000 ug | INHALATION_SOLUTION | Freq: Two times a day (BID) | RESPIRATORY_TRACT | Status: DC

## 2023-10-25 MED ORDER — SODIUM CHLORIDE 0.9 % IV BOLUS (SEPSIS)
1000.0000 mL | Freq: Once | INTRAVENOUS | Status: AC
  Administered 2023-10-25: 1000 mL via INTRAVENOUS

## 2023-10-25 MED ORDER — PANTOPRAZOLE SODIUM 40 MG PO TBEC
40.0000 mg | DELAYED_RELEASE_TABLET | Freq: Every morning | ORAL | Status: DC
  Administered 2023-10-27 – 2023-10-28 (×2): 40 mg via ORAL
  Filled 2023-10-25 (×2): qty 1

## 2023-10-25 MED ORDER — MIDODRINE HCL 5 MG PO TABS
15.0000 mg | ORAL_TABLET | Freq: Once | ORAL | Status: AC
  Administered 2023-10-25: 15 mg via ORAL
  Filled 2023-10-25: qty 3

## 2023-10-25 MED ORDER — POLYETHYLENE GLYCOL 3350 17 G PO PACK: 17.0000 g | PACK | Freq: Every day | ORAL | Status: DC | PRN

## 2023-10-25 MED ORDER — SODIUM CHLORIDE 0.9 % IV SOLN
2.0000 g | Freq: Once | INTRAVENOUS | Status: AC
  Administered 2023-10-25: 2 g via INTRAVENOUS
  Filled 2023-10-25: qty 12.5

## 2023-10-25 MED ORDER — VANCOMYCIN HCL IN DEXTROSE 1-5 GM/200ML-% IV SOLN: 1000.0000 mg | Freq: Once | INTRAVENOUS | Status: DC

## 2023-10-25 MED ORDER — ACETAMINOPHEN 325 MG PO TABS
650.0000 mg | ORAL_TABLET | Freq: Once | ORAL | Status: AC
  Administered 2023-10-25: 650 mg via ORAL
  Filled 2023-10-25: qty 2

## 2023-10-25 MED ORDER — ALBUTEROL SULFATE (2.5 MG/3ML) 0.083% IN NEBU: 2.5000 mg | INHALATION_SOLUTION | RESPIRATORY_TRACT | Status: DC | PRN

## 2023-10-25 MED ORDER — SODIUM CHLORIDE 0.9 % IV SOLN
INTRAVENOUS | Status: AC
  Administered 2023-10-25: 150 mL/h via INTRAVENOUS

## 2023-10-25 MED ORDER — MIDODRINE HCL 5 MG PO TABS
15.0000 mg | ORAL_TABLET | Freq: Three times a day (TID) | ORAL | Status: DC
  Administered 2023-10-25 – 2023-10-28 (×8): 15 mg via ORAL
  Filled 2023-10-25 (×8): qty 3

## 2023-10-25 MED ORDER — METOCLOPRAMIDE HCL 5 MG PO TABS
5.0000 mg | ORAL_TABLET | Freq: Three times a day (TID) | ORAL | Status: DC
  Administered 2023-10-26 – 2023-10-28 (×6): 5 mg via ORAL
  Filled 2023-10-25 (×9): qty 1

## 2023-10-25 MED ORDER — AZELASTINE HCL 0.1 % NA SOLN: 2.0000 | Freq: Two times a day (BID) | NASAL | Status: DC | PRN

## 2023-10-25 MED ORDER — CINNAMON 500 MG PO CAPS: 500.0000 mg | ORAL_CAPSULE | Freq: Every morning | ORAL | Status: DC

## 2023-10-25 NOTE — ED Notes (Signed)
 RN f/u next steps in pt care with Dr. Theresia Lo. Dr. Theresia Lo ordered pt 15 mg Midodrine home dose and stated fluid bolus is appropriate for care. After pt takes Midodrine and received second liter of fluids EDP will re-evaluate care. Updated family and no additional questions at this time.   Pt provided a pillow.

## 2023-10-25 NOTE — Progress Notes (Addendum)
 Pharmacy Antibiotic Note  Joshua [name] is a 88 y.o. male admitted on 10/25/2023 with septic shock suspected secondary to pneumonia.  Pharmacy has been consulted for vancomycin and cefepime dosing. Mild AKI on adimssion, Scr: 1.54, BL around 1.2.   Plan: Cefepime 2g q12h.  Vancomycin 1000mg  q24h for eAUC: 427, Scr: 1.54, Vd: 0.72. Patient received 1500mg  load in ED.  Follow culture data for de-escalation.  Monitor renal function for dose adjustments as indicated.  F/u MRSA PCR for de-escalation.   Height: 6\' 3"  (190.5 cm) Weight: 83 kg (183 lb) IBW/kg (Calculated) : 84.5  Temp (24hrs), Avg:100.3 F (37.9 C), Min:98.6 F (37 C), Max:102 F (38.9 C)  Recent Labs  Lab 10/25/23 1219 10/25/23 1248 10/25/23 1406  WBC 13.0*  --   --   CREATININE 1.54*  --   --   LATICACIDVEN  --  2.7* 2.0*    Estimated Creatinine Clearance: 41.2 mL/min (A) (by C-G formula based on SCr of 1.54 mg/dL (H)).    Allergies  Allergen Reactions   Other Swelling    Beer- Swelling    Sunflower Oil Swelling   Sulfa Antibiotics Rash    Microbiology results: 3/8 BCx:  3/8 MRSA PCR:   Thank you for allowing pharmacy to be a part of this patient's care.  Estill Batten, PharmD, BCCCP 10/25/2023 6:44 PM

## 2023-10-25 NOTE — Sepsis Progress Note (Signed)
 Following for sepsis monitoring ?

## 2023-10-25 NOTE — ED Notes (Signed)
 Called micro to add on 20 pathogen

## 2023-10-25 NOTE — ED Notes (Signed)
 Pt's Adempas med taken to pharmacy by Chelsea Aus [facility] Andrew-ED tech/EMT for pharmacy to dispense

## 2023-10-25 NOTE — ED Provider Notes (Signed)
 [place] EMERGENCY DEPARTMENT AT [facility] Provider Note   CSN: [id number] Arrival date & time: 10/25/23  1156     History  Chief Complaint  Patient presents with   Shortness of Breath   Weakness   Tachycardia    Joshua [name] is a 88 y.o. male.  Patient is a 88 year old male with a past medical history of COPD on 2 L home O2, VTE on Eliquis, diabetes, CKD, CHF presenting to the emergency department with shortness of breath and chills.  The patient states that he was just discharged from the hospital for pneumonia on Monday after being treated for 10 days.  He states he has already completed the antibiotics.  He states he was initially feeling well until he woke up this morning.  He states that he has been coughing again.  Denies any chest pain.  Denies any nausea, vomiting or diarrhea and states that his appetite has been well.  Denies any dysuria or hematuria.  He was tachycardic and hypoxic on EMS arrival, initially placed on 6 L nasal cannula and was still desatting and increased to nonrebreather.  The history is provided by the patient, the spouse and the EMS personnel.  Shortness of Breath Weakness Associated symptoms: shortness of breath        Home Medications Prior to Admission medications   Medication Sig Start Date End Date Taking? Authorizing Provider  ADEMPAS 2 MG TABS TAKE 1 TABLET BY MOUTH 3 TIMES A DAY AS DIRECTED. DO NOT HANDLE IF PREGNANT 08/26/23   Laurey Morale, MD  albuterol (VENTOLIN HFA) 108 (90 Base) MCG/ACT inhaler Inhale 1 puff into the lungs every 4 (four) hours as needed for shortness of breath.    [provider]  allopurinol (ZYLOPRIM) 100 MG tablet Take 100 mg by mouth every morning.    [provider]  atorvastatin (LIPITOR) 40 MG tablet TAKE 1 TABLET BY MOUTH EVERY DAY 06/30/23   Milford, Anderson Malta, FNP  azelastine (ASTELIN) 0.1 % nasal spray Place 2 sprays into both nostrils 2 (two) times daily as needed for  rhinitis. Use in each nostril as directed    [provider]  Cinnamon 500 MG capsule Take 500 mg by mouth every morning.    [provider]  dapagliflozin propanediol (FARXIGA) 10 MG TABS tablet Take 1 tablet (10 mg total) by mouth daily before breakfast. 02/25/23   Milford, Anderson Malta, FNP  ELIQUIS 5 MG TABS tablet TAKE 1 TABLET BY MOUTH TWICE A DAY 07/21/23   Laurey Morale, MD  finasteride (PROSCAR) 5 MG tablet Take 5 mg by mouth at bedtime.    [provider]  gabapentin (NEURONTIN) 300 MG capsule Take 300 mg by mouth 3 (three) times daily. 04/04/22   [provider]  magnesium oxide (MAG-OX) 400 (240 Mg) MG tablet TAKE 1 TABLET BY MOUTH EVERY DAY 09/29/23   Laurey Morale, MD  metoCLOPramide (REGLAN) 5 MG tablet Take 5 mg by mouth 3 (three) times daily before meals.    [provider]  midodrine (PROAMATINE) 5 MG tablet TAKE 3 TABLETS BY MOUTH 3 TIMES DAILY. Patient taking differently: Take 5 mg by mouth daily after lunch. 09/05/23   Laurey Morale, MD  Multiple Vitamin (MULTIVITAMIN WITH MINERALS) TABS tablet Take 1 tablet by mouth daily. 05/01/21   Marguerita Merles Latif, DO  OXYGEN Inhale 2 L into the lungs continuous.    [provider]  pantoprazole (PROTONIX) 40 MG tablet Take  1 tablet (40 mg total) by mouth every morning. 03/07/23   Cobb, Ruby Cola, NP  potassium chloride (KLOR-CON) 10 MEQ tablet Take 10 mEq by mouth 2 (two) times daily.    [provider]  Tiotropium Bromide-Olodaterol (STIOLTO RESPIMAT) 2.5-2.5 MCG/ACT AERS Inhale 2 puffs into the lungs daily. Patient taking differently: Inhale 1 puff into the lungs daily. 08/04/23   Oretha Milch, MD  torsemide (DEMADEX) 20 MG tablet Take 20 mg by mouth daily.    [provider]      Allergies    Other, Sunflower oil, and Sulfa antibiotics    Review of Systems   Review of Systems  Respiratory:  Positive for shortness of breath.   Neurological:  Positive for  weakness.    Physical Exam Updated Vital Signs BP (!) 114/51   Pulse 84   Temp 100 F (37.8 C) (Oral)   Resp 20   Ht 6\' 3"  (1.905 m)   Wt 83 kg   SpO2 97%   BMI 22.87 kg/m  Physical Exam Vitals and nursing note reviewed.  Constitutional:      General: He is not in acute distress.    Appearance: He is well-developed. He is ill-appearing.  HENT:     Head: Normocephalic and atraumatic.     Mouth/Throat:     Mouth: Mucous membranes are moist.  Eyes:     Extraocular Movements: Extraocular movements intact.  Cardiovascular:     Rate and Rhythm: Normal rate and regular rhythm.  Pulmonary:     Effort: Pulmonary effort is normal.     Breath sounds: Rales (bilaterally) present.  Abdominal:     Palpations: Abdomen is soft.     Tenderness: There is no abdominal tenderness.  Musculoskeletal:        General: Normal range of motion.     Cervical back: Normal range of motion and neck supple.     Right lower leg: No edema.     Left lower leg: No edema.  Skin:    General: Skin is warm and dry.  Neurological:     General: No focal deficit present.     Mental Status: He is alert and oriented to person, place, and time.  Psychiatric:        Mood and Affect: Mood normal.        Behavior: Behavior normal.     ED Results / Procedures / Treatments   Labs (all labs ordered are listed, but only abnormal results are displayed) Labs Reviewed  COMPREHENSIVE METABOLIC PANEL - Abnormal; Notable for the following components:      Result Value   Chloride 97 (*)    Glucose, Bld 102 (*)    Creatinine, Ser 1.54 (*)    Total Protein 6.2 (*)    Albumin 3.0 (*)    GFR, Estimated 44 (*)    All other components within normal limits  CBC WITH DIFFERENTIAL/PLATELET - Abnormal; Notable for the following components:   WBC 13.0 (*)    Hemoglobin 12.6 (*)    RDW 17.4 (*)    Neutro Abs 11.4 (*)    Lymphs Abs 0.5 (*)    All other components within normal limits  PROTIME-INR - Abnormal; Notable  for the following components:   Prothrombin Time 19.6 (*)    INR 1.6 (*)    All other components within normal limits  I-STAT CG4 LACTIC ACID, ED - Abnormal; Notable for the following components:   Lactic Acid, Venous 2.7 (*)  All other components within normal limits  I-STAT CG4 LACTIC ACID, ED - Abnormal; Notable for the following components:   Lactic Acid, Venous 2.0 (*)    All other components within normal limits  RESP PANEL BY RT-PCR (RSV, FLU A&B, COVID)  RVPGX2  CULTURE, BLOOD (ROUTINE X 2)  CULTURE, BLOOD (ROUTINE X 2)  APTT  URINALYSIS, W/ REFLEX TO CULTURE (INFECTION SUSPECTED)    EKG EKG Interpretation Date/Time:  Saturday October 25 2023 12:18:22 EST Ventricular Rate:  97 PR Interval:  136 QRS Duration:  164 QT Interval:  391 QTC Calculation: 497 R Axis:   157  Text Interpretation: Sinus tachycardia Atrial premature complex Probable left atrial enlargement RBBB and LPFB No significant change since last tracing Confirmed by Elayne Snare ([id number]) on 10/25/2023 12:31:42 PM  Radiology DG Chest Portable 1 View Result Date: 10/25/2023 CLINICAL DATA:  infection EXAM: PORTABLE CHEST 1 VIEW COMPARISON:  October 13, 2023, October 10, 2023 FINDINGS: The cardiomediastinal silhouette is unchanged in contour.Atherosclerotic calcifications. No pleural effusion. No pneumothorax. Patchy predominately LEFT-sided airspace opacities, mildly increased in comparison to prior. RIGHT basilar reticular nodularity is mildly improved. IMPRESSION: 1. Mildly increased patchy LEFT-sided airspace opacities. Differential considerations include aspiration atelectasis or infection. Recommend follow-up PA and lateral chest radiograph in 4-6 weeks to assess for resolution. 2. Mildly improved RIGHT basilar reticular nodularity. Electronically Signed   By: Meda Klinefelter M.D.   On: 10/25/2023 13:02    Procedures .Critical Care  Performed by: Rexford Maus, DO Authorized by: Rexford Maus, DO   Critical care provider statement:    Critical care time (minutes):  30   Critical care was necessary to treat or prevent imminent or life-threatening deterioration of the following conditions:  Respiratory failure, sepsis and shock   Critical care was time spent personally by me on the following activities:  Development of treatment plan with patient or surrogate, discussions with consultants, evaluation of patient's response to treatment, examination of patient, ordering and review of laboratory studies, ordering and review of radiographic studies, ordering and performing treatments and interventions, pulse oximetry, re-evaluation of patient's condition and review of old charts     Medications Ordered in ED Medications  0.9 %  sodium chloride infusion ( Intravenous New Bag/Given 10/25/23 1502)  vancomycin (VANCOREADY) IVPB 1500 mg/300 mL (1,500 mg Intravenous New Bag/Given 10/25/23 1358)  norepinephrine (LEVOPHED) 4mg  in (0.016 mg/mL) premix infusion (4 mcg/min Intravenous Rate/Dose Change 10/25/23 1506)  sodium chloride 0.9 % bolus 1,000 mL (0 mLs Intravenous Stopped 10/25/23 1358)  sodium chloride 0.9 % bolus 1,000 mL (0 mLs Intravenous Stopped 10/25/23 1501)  ceFEPIme (MAXIPIME) 2 g in sodium chloride 0.9 % 100 mL IVPB (0 g Intravenous Stopped 10/25/23 1400)  acetaminophen (TYLENOL) tablet 650 mg (650 mg Oral Given 10/25/23 1348)  midodrine (PROAMATINE) tablet 15 mg (15 mg Oral Given 10/25/23 1335)    ED Course/ Medical Decision Making/ A&P Clinical Course as of 10/25/23 1542  Sat Oct 25, 2023  1320 Increased L airspace opacity concerning for worsening pneumonia in the setting of his fever and worsening hypoxia. Increased leukocytosis and lactic acid consistent with sepsis. [VK]  1408 BP still soft after 1st liter of IVF, received home midodrine and 2nd liter starting now. Satting well on 5 L HFNC. Will need admission, pending repeat BP for level of care. [VK]  1444 Patient has  remained hypotensive despite IVF and midodrine. Will start levophed and consult critical care for admission. [VK]    Clinical  Course User Index [VK] Rexford Maus, DO                                 Medical Decision Making This patient presents to the ED with chief complaint(s) of SOB, chills with pertinent past medical history of COPD on 2L home O2, CHF, CKD, DM, VTE on Eliquis, recent pneumonia which further complicates the presenting complaint. The complaint involves an extensive differential diagnosis and also carries with it a high risk of complications and morbidity.    The differential diagnosis includes sepsis, pneumonia, pneumothorax, pulmonary edema, pleural effusion, ACS, arrhythmia, viral syndrome, dehydration, electrolyte abnormality, no wheezing on exam making COPD exacerbation unlikely  Additional history obtained: Additional history obtained from spouse Records reviewed previous admission documents  ED Course and Reassessment: On patient's arrival he is febrile, hypoxic and hypotensive, satting well on nonrebreather in no acute respiratory distress.  EKG on arrival showed normal sinus rhythm without acute ischemic changes.  Patient had IV access obtained and will undergo workup for sepsis.  Patient started on IV fluids and will additionally be given his home midodrine for his blood pressure.  Broad-spectrum antibiotics were started with concern for possible hospital-acquired pneumonia and he will be closely reassessed.  Will plan to transition from nonrebreather to high flow nasal cannula for his hypoxia.  Independent labs interpretation:  The following labs were independently interpreted: leukocytosis, mildly elevated lactic, leukocytosis  Independent visualization of imaging: - I independently visualized the following imaging with scope of interpretation limited to determining acute life threatening conditions related to emergency care: CXR, which revealed worsening  L-sided pneumonia  Consultation: - Consulted or discussed management/test interpretation w/ external professional: critical care  Consideration for admission or further workup: patient requires admission for sepsis from pneumonia Social Determinants of health: N/A    Amount and/or Complexity of Data Reviewed Labs: ordered. Radiology: ordered.  Risk OTC drugs. Prescription drug management. Decision regarding hospitalization.          Final Clinical Impression(s) / ED Diagnoses Final diagnoses:  Pneumonia of left lower lobe due to infectious organism  Septic shock (HCC)  AKI (acute kidney injury) [facility] [place])    Rx / DC Orders ED Discharge Orders     None         Rexford Maus, DO 10/25/23 1542

## 2023-10-25 NOTE — H&P (Signed)
 NAME:  Joshua [name], MRN:  [id number], DOB:  September 23, 1935, LOS: 0 ADMISSION DATE:  10/25/2023, CONSULTATION DATE:  10/25/23 REFERRING MD:  Dr. Wilkie Aye, CHIEF COMPLAINT:  SOB/ weakness   History of Present Illness:   Pleasant 3 yoM from home with PMH of COPD/ emphysema, chronic hypoxic respiratory failure on 2-3L O2, DM, CKD 3a, HFpEF, PH, chronic PE on eliquis, chronic hypotension on midodrine, OSA (declines CPAP), and hiatal hernia presenting from home with onset of chills, nausea (no vomiting) and generalized weakness since this am.  Denies SOB, CP, LE swelling, pain, dysphagia, orthopnea, change in bowel habits. Self urinary caths up to TID prn for years, denies urinary symptoms.  No change in cough, occasionally pink in am, otherwise white.  No sick contacts, lives at home with wife and until today feeling stronger, not having to use walker.   Recent admit 2/21- 3/3 with septic shock 2/2 bilateral PNA, suspected aspiration, discharged home on midodrine.  Compliant with meds.    In ER, temp 102, SBP 80-90 with MAPs 55-60, sats 94% on 5L in no resp distress.  WBC 13, lactic 2.7> 2, SARS/ flu/ RSV neg, LFTs ok, sCr 1.54 (previously 1.21).  CXR with worsening left lower infiltrate.  Placed on peripheral NE and PCCM called for admit.   Pertinent  Medical History  COPD/ emphysema, chronic hypoxic respiratory failure on 2-3L O2, DM, CKD 3a, HFpEF, PH, chronic PE on eliquis, aspiraiton PNA, chronic hypotension on midodrine, OSA  Significant Hospital Events: Including procedures, antibiotic start and stop dates in addition to other pertinent events     Interim History / Subjective:   Objective   Blood pressure (!) 103/46, pulse 88, temperature 100 F (37.8 C), temperature source Oral, resp. rate (!) 28, height 6\' 3"  (1.905 m), weight 83 kg, SpO2 97%.       No intake or output data in the 24 hours ending 10/25/23 1717 Filed Weights   10/25/23 1210  Weight: 83 kg    Examination: General:   Pleasant elderly male sitting in bed in NAD HEENT: MM pink/moist, irregular pupils (cataracts) Neuro: Aox4, MAE, non focal CV: rr, +murmur PULM:  non labored, clear, diminished on left, no wheeze, down to 3L/ 95% GI: soft, bs+, NT/ ND Extremities: warm/dry, trace LE edema  Skin: no rashes   Resolved Hospital Problem list    Assessment & Plan:   LLL PNA- concern for aspiration PNA (either silent aspiration vs 2/2 hiatial hernia) r/o other infectious symptoms Chronic hypoxic respiratory failure - currently on his baseline 2-3L  Hx COPD/ emphysema, doubt AE Hx OSA- not interested in CPAP - was evaluated by SLP, but resp status prevented MBS.  Will reconsult given suspected recurrent asp P:  - cont supplemental O2 for sat goal> 88-95% - SLP eval - cont cefepime/ vanc for now.  Check MRSA PCR, urine strep, PCT - PPI - PT - triple therapy nebs, prn albuterol - DNI, ok to try BiPAP if needed  - CXR prn, consider CT if worsening    Septic shock secondary to recurrent PNA Chronic hypotension on midodrine - wean peripheral NE for MAP goal > 65.  Change in BP cuff better pressures.  Avoid BP drops with chronic RV failure - cont home midodrine - abx as above - check UA - trend fever curve/ CBC, PCT  HFpHF Pulmonary hypertension with cor pulmonale (WHO 3&4) Hx PE - volume status ok, trace LE edema.  LFTs ok, hold diuresis for now - hold  adempas, toresemide, farxiga for now - cont eliquis  CKD 3a - baseline 1.5-1.6, but improved to 1.2 on recent discharge - monitor for retention, cath prn - trend renal indices  - strict I/Os, daily wts - avoid nephrotoxins, renal dose meds, hemodynamic support as above   GERD/ hiatal hernia - PPI, cont pta reglan  Best Practice (right click and "Reselect all SmartList Selections" daily)   Diet/type: NPO w/ oral meds DVT prophylaxis DOAC Pressure ulcer(s): N/A GI prophylaxis: PPI Lines: N/A Foley:  N/A Code Status:  DNR/ DNI- ok with  pressors/ BiPAP if needed Last date of multidisciplinary goals of care discussion [3/8]  Labs   CBC: Recent Labs  Lab 10/25/23 1219  WBC 13.0*  NEUTROABS 11.4*  HGB 12.6*  HCT 39.7  MCV 85.7  PLT 235    Basic Metabolic Panel: Recent Labs  Lab 10/25/23 1219  NA 140  K 4.0  CL 97*  CO2 32  GLUCOSE 102*  BUN 23  CREATININE 1.54*  CALCIUM 9.4   GFR: Estimated Creatinine Clearance: 41.2 mL/min (A) (by C-G formula based on SCr of 1.54 mg/dL (H)). Recent Labs  Lab 10/25/23 1219 10/25/23 1248 10/25/23 1406  WBC 13.0*  --   --   LATICACIDVEN  --  2.7* 2.0*    Liver Function Tests: Recent Labs  Lab 10/25/23 1219  AST 29  ALT 21  ALKPHOS 74  BILITOT 0.8  PROT 6.2*  ALBUMIN 3.0*   No results for input(s): "LIPASE", "AMYLASE" in the last 168 hours. No results for input(s): "AMMONIA" in the last 168 hours.  ABG    Component Value Date/Time   PHART 7.364 07/16/2022 0217   PCO2ART 59.2 (H) 07/16/2022 0217   PO2ART 63 (L) 07/16/2022 0217   HCO3 33.0 (H) 07/16/2022 0217   TCO2 35 (H) 07/16/2022 0217   O2SAT 87 07/16/2022 0217     Coagulation Profile: Recent Labs  Lab 10/25/23 1257  INR 1.6*    Cardiac Enzymes: No results for input(s): "CKTOTAL", "CKMB", "CKMBINDEX", "TROPONINI" in the last 168 hours.  HbA1C: Hgb A1c MFr Bld  Date/Time Value Ref Range Status  10/10/2023 04:48 PM 5.5 4.8 - 5.6 % Final    Comment:    (NOTE) Pre diabetes:          5.7%-6.4%  Diabetes:              >6.4%  Glycemic control for   <7.0% adults with diabetes   03/13/2023 04:40 PM 5.7 (H) 4.8 - 5.6 % Final    Comment:    (NOTE) Pre diabetes:          5.7%-6.4%  Diabetes:              >6.4%  Glycemic control for   <7.0% adults with diabetes     CBG: No results for input(s): "GLUCAP" in the last 168 hours.  Review of Systems:   As per HPI otherwise neg  Past Medical History:  He,  has a past medical history of Arthritis, BPH (benign prostatic hyperplasia),  CHF (congestive heart failure) (HCC), Chronic hiccups, COPD (chronic obstructive pulmonary disease) (HCC), Folliculitis, GERD (gastroesophageal reflux disease), Gout, Hypertension, Hypotension, NSTEMI (non-ST elevated myocardial infarction) (HCC) (03/19/2022), PE (pulmonary thromboembolism) (HCC), Phlebitis, Pneumonia, Pulmonary embolism (HCC) (03/17/2022), Pulmonary hypertension (HCC), Severe sepsis (HCC) (03/16/2022), and Sleep apnea.   Surgical History:   Past Surgical History:  Procedure Laterality Date   BIOPSY  11/03/2020   Procedure: BIOPSY;  Surgeon: Jeani Hawking, MD;  Location: [facility];  Service: Endoscopy;;   BIOPSY  07/06/2021   Procedure: BIOPSY;  Surgeon: Jeani Hawking, MD;  Location: [facility];  Service: Endoscopy;;   bone removed from little toe right foot      CHOLECYSTECTOMY     ESOPHAGOGASTRODUODENOSCOPY (EGD) WITH PROPOFOL N/A 11/03/2020   Procedure: ESOPHAGOGASTRODUODENOSCOPY (EGD) WITH PROPOFOL;  Surgeon: Jeani Hawking, MD;  Location: [facility];  Service: Endoscopy;  Laterality: N/A;   ESOPHAGOGASTRODUODENOSCOPY (EGD) WITH PROPOFOL N/A 07/06/2021   Procedure: ESOPHAGOGASTRODUODENOSCOPY (EGD) WITH PROPOFOL;  Surgeon: Jeani Hawking, MD;  Location: [facility];  Service: Endoscopy;  Laterality: N/A;   pilonidal cyst removal      RIGHT HEART CATH N/A 12/13/2020   Procedure: RIGHT HEART CATH;  Surgeon: Laurey Morale, MD;  Location: [facility] INVASIVE CV LAB;  Service: Cardiovascular;  Laterality: N/A;   RIGHT/LEFT HEART CATH AND CORONARY ANGIOGRAPHY N/A 03/21/2022   Procedure: RIGHT/LEFT HEART CATH AND CORONARY ANGIOGRAPHY;  Surgeon: Laurey Morale, MD;  Location: [facility] INVASIVE CV LAB;  Service: Cardiovascular;  Laterality: N/A;   TOTAL KNEE ARTHROPLASTY Left 09/13/2014   Procedure: LEFT TOTAL KNEE ARTHROPLASTY;  Surgeon: Shelda Pal, MD;  Location: [facility];  Service: Orthopedics;  Laterality: Left;     Social History:   reports that he quit smoking about 17 years ago.  His smoking use included cigarettes. He started smoking about 69 years ago. He has a 104 pack-year smoking history. He has never used smokeless tobacco. He reports that he does not currently use alcohol after a past usage of about 6.0 standard drinks of alcohol per week. He reports that he does not use drugs.   Family History:  His family history includes Heart attack (age of onset: 65) in his brother.   Allergies Allergies  Allergen Reactions   Other Swelling    Beer- Swelling    Sunflower Oil Swelling   Sulfa Antibiotics Rash     Home Medications  Prior to Admission medications   Medication Sig Start Date End Date Taking? Authorizing Provider  ADEMPAS 2 MG TABS TAKE 1 TABLET BY MOUTH 3 TIMES A DAY AS DIRECTED. DO NOT HANDLE IF PREGNANT 08/26/23   Laurey Morale, MD  albuterol (VENTOLIN HFA) 108 (90 Base) MCG/ACT inhaler Inhale 1 puff into the lungs every 4 (four) hours as needed for shortness of breath.    [provider]  allopurinol (ZYLOPRIM) 100 MG tablet Take 100 mg by mouth every morning.    [provider]  atorvastatin (LIPITOR) 40 MG tablet TAKE 1 TABLET BY MOUTH EVERY DAY 06/30/23   Milford, Anderson Malta, FNP  azelastine (ASTELIN) 0.1 % nasal spray Place 2 sprays into both nostrils 2 (two) times daily as needed for rhinitis. Use in each nostril as directed    [provider]  Cinnamon 500 MG capsule Take 500 mg by mouth every morning.    [provider]  dapagliflozin propanediol (FARXIGA) 10 MG TABS tablet Take 1 tablet (10 mg total) by mouth daily before breakfast. 02/25/23   Milford, Anderson Malta, FNP  ELIQUIS 5 MG TABS tablet TAKE 1 TABLET BY MOUTH TWICE A DAY 07/21/23   Laurey Morale, MD  finasteride (PROSCAR) 5 MG tablet Take 5 mg by mouth at bedtime.    [provider]  gabapentin (NEURONTIN) 300 MG capsule Take 300 mg by mouth 3 (three) times daily. 04/04/22   [provider]  magnesium oxide (MAG-OX) 400 (240 Mg) MG tablet  TAKE 1 TABLET BY  MOUTH EVERY DAY 09/29/23   Laurey Morale, MD  metoCLOPramide (REGLAN) 5 MG tablet Take 5 mg by mouth 3 (three) times daily before meals.    [provider]  midodrine (PROAMATINE) 5 MG tablet TAKE 3 TABLETS BY MOUTH 3 TIMES DAILY. Patient taking differently: Take 5 mg by mouth daily after lunch. 09/05/23   Laurey Morale, MD  Multiple Vitamin (MULTIVITAMIN WITH MINERALS) TABS tablet Take 1 tablet by mouth daily. 05/01/21   Marguerita Merles Latif, DO  OXYGEN Inhale 2 L into the lungs continuous.    [provider]  pantoprazole (PROTONIX) 40 MG tablet Take 1 tablet (40 mg total) by mouth every morning. 03/07/23   Cobb, Ruby Cola, NP  potassium chloride (KLOR-CON) 10 MEQ tablet Take 10 mEq by mouth 2 (two) times daily.    [provider]  Tiotropium Bromide-Olodaterol (STIOLTO RESPIMAT) 2.5-2.5 MCG/ACT AERS Inhale 2 puffs into the lungs daily. Patient taking differently: Inhale 1 puff into the lungs daily. 08/04/23   Oretha Milch, MD  torsemide (DEMADEX) 20 MG tablet Take 20 mg by mouth daily.    [provider]     Critical care time: 45 mins       Posey Boyer, MSN, AG-ACNP-BC [facility] 10/25/2023, 6:42 PM  See Amion for pager If no response to pager , please call [phone] until 7pm After 7:00 pm call Elink  [phone]

## 2023-10-25 NOTE — ED Notes (Addendum)
 Pt's O2 dropping to high 80s, but does bounce back up to 90s with a good pleth. Provider made aware. NRB put back on

## 2023-10-25 NOTE — ED Triage Notes (Addendum)
 Pt to ED from home c/o shakiness, weakness, increased respirations. D/C'd Monday for b/l pneumonia . 102F. 88% 4L at home. RR high  30s. +Tachycardia. Put on NRB in the field. 90s on NRB.   20G L AC 200LR. 650mg  APAP

## 2023-10-26 ENCOUNTER — Inpatient Hospital Stay (HOSPITAL_COMMUNITY)

## 2023-10-26 ENCOUNTER — Encounter (HOSPITAL_COMMUNITY): Payer: Self-pay | Admitting: Emergency Medicine

## 2023-10-26 DIAGNOSIS — I50810 Right heart failure, unspecified: Secondary | ICD-10-CM | POA: Diagnosis not present

## 2023-10-26 DIAGNOSIS — A419 Sepsis, unspecified organism: Secondary | ICD-10-CM | POA: Diagnosis not present

## 2023-10-26 DIAGNOSIS — I2781 Cor pulmonale (chronic): Secondary | ICD-10-CM

## 2023-10-26 DIAGNOSIS — N1831 Chronic kidney disease, stage 3a: Secondary | ICD-10-CM | POA: Diagnosis not present

## 2023-10-26 DIAGNOSIS — I272 Pulmonary hypertension, unspecified: Secondary | ICD-10-CM

## 2023-10-26 DIAGNOSIS — R6521 Severe sepsis with septic shock: Secondary | ICD-10-CM | POA: Diagnosis not present

## 2023-10-26 LAB — BASIC METABOLIC PANEL
Anion gap: 6 (ref 5–15)
BUN: 18 mg/dL (ref 8–23)
CO2: 27 mmol/L (ref 22–32)
Calcium: 8.9 mg/dL (ref 8.9–10.3)
Chloride: 104 mmol/L (ref 98–111)
Creatinine, Ser: 1.13 mg/dL (ref 0.61–1.24)
GFR, Estimated: >60 mL/min (ref >60)
Glucose, Bld: 78 mg/dL (ref 70–99)
Potassium: 3.6 mmol/L (ref 3.5–5.1)
Sodium: 137 mmol/L (ref 135–145)

## 2023-10-26 LAB — STREP PNEUMONIAE URINARY ANTIGEN: Strep Pneumo Urinary Antigen: NEGATIVE

## 2023-10-26 LAB — CBC
HCT: 37.3 % — ABNORMAL LOW (ref 39.0–52.0)
Hemoglobin: 11.9 g/dL — ABNORMAL LOW (ref 13.0–17.0)
MCH: 27.3 pg (ref 26.0–34.0)
MCHC: 31.9 g/dL (ref 30.0–36.0)
MCV: 85.6 fL (ref 80.0–100.0)
Platelets: 192 10*3/uL (ref 150–400)
RBC: 4.36 MIL/uL (ref 4.22–5.81)
RDW: 17.5 % — ABNORMAL HIGH (ref 11.5–15.5)
WBC: 22.4 10*3/uL — ABNORMAL HIGH (ref 4.0–10.5)
nRBC: 0 % (ref 0.0–0.2)

## 2023-10-26 LAB — MAGNESIUM: Magnesium: 1.4 mg/dL — ABNORMAL LOW (ref 1.7–2.4)

## 2023-10-26 LAB — PROCALCITONIN: Procalcitonin: 1.88 ng/mL

## 2023-10-26 LAB — PHOSPHORUS: Phosphorus: 2 mg/dL — ABNORMAL LOW (ref 2.5–4.6)

## 2023-10-26 MED ORDER — FUROSEMIDE 10 MG/ML IJ SOLN
80.0000 mg | Freq: Once | INTRAMUSCULAR | Status: AC
  Administered 2023-10-26: 80 mg via INTRAVENOUS
  Filled 2023-10-26: qty 8

## 2023-10-26 MED ORDER — CHLORHEXIDINE GLUCONATE CLOTH 2 % EX PADS
6.0000 | MEDICATED_PAD | Freq: Every day | CUTANEOUS | Status: DC
  Administered 2023-10-26 – 2023-10-28 (×3): 6 via TOPICAL

## 2023-10-26 MED ORDER — POTASSIUM & SODIUM PHOSPHATES 280-160-250 MG PO PACK
2.0000 | PACK | Freq: Three times a day (TID) | ORAL | Status: DC
  Filled 2023-10-26: qty 2

## 2023-10-26 MED ORDER — MAGNESIUM SULFATE 4 GM/100ML IV SOLN
4.0000 g | Freq: Once | INTRAVENOUS | Status: AC
  Administered 2023-10-26: 4 g via INTRAVENOUS
  Filled 2023-10-26: qty 100

## 2023-10-26 MED ORDER — POTASSIUM PHOSPHATES 15 MMOLE/5ML IV SOLN
30.0000 mmol | Freq: Once | INTRAVENOUS | Status: AC
  Administered 2023-10-26: 30 mmol via INTRAVENOUS
  Filled 2023-10-26: qty 10

## 2023-10-26 NOTE — Consult Note (Signed)
   ADVANCED HEART FAILURE CONSULT NOTE  Referring Physician: No ref. provider found  Primary Care: Joshua Reichmann, DO Primary Cardiologist: Joshua Ancona, MD  CC: Cor pulmonale  HPI: Joshua [name] is a 88 y.o. male with heart failure with preserved ejection fraction, prior PE, pulmonary hypertension, chronic hypotension on midodrine, obstructive sleep apnea currently admitted with hypoxia and possible pneumonia.  He is followed outpatient by Joshua [name].  Most recent echocardiogram with EF of 55 to 60% with severe RV enlargement and severely decreased RV function with a PA systolic pressure of 57 mmHg.  VQ scan suggestive of chronic PEs.  Right heart catheterizations with moderate pulmonary arterial hypertension and PFTs with severe obstruction, moderate restriction and severely decreased DLCO.    He has had numerous admissions over the past several years.  Admitted in August 2023 with septic shock secondary to aspiration pneumonia and colitis.  Admitted again in November 2023 with acute on chronic respiratory failure after COVID-19 infection and sepsis from pneumonia versus UTI.  Admitted in July 2024 with recurrent aspiration pneumonia and COPD exacerbation and admitted from February 21 to March 3 with septic shock secondary to pneumonia from suspected aspiration.  In the ER on arrival, patient febrile to 102, hypotensive with systolic blood pressure in the 80s on 5 L nasal cannula.  WBC count 13.  COVID-19/flu/RSV negative.  Chest x-ray with left lower infiltrate.   PHYSICAL EXAM: Vitals:   10/26/23 1000 10/26/23 1100  BP: 120/66 (!) 117/57  Pulse: 80 65  Resp: (!) 31 18  Temp:    SpO2: 94% 94%   GENERAL: NAD Lungs- decreased at bases CARDIAC:  JVP: 12 cm          Normal rate with regular rhythm. 1+ edema.  ABDOMEN: Soft, non-tender, non-distended.  EXTREMITIES: Warm and well perfused.  NEUROLOGIC: No obvious FND   DATA REVIEW  ECG: 10/25/23: sinus tachycardia with RBBB  As  per my personal interpretation  ECHO: 10/11/23:LVEF of 55% and severely enlarged RV w/ severely reduced function.   CATH: 03/21/22:  Hemodynamics (mmHg) RA mean 1 RV 47/3 PA 46/10, mean 25 PCWP mean 4 LV 95/4 AO 101/49  Oxygen saturations: PA 64% AO 95%  Cardiac Output (Fick) 6.14  Cardiac Index (Fick) 2.98 PVR 3.4 WU    ASSESSMENT & PLAN:  LLL Pneumonia - Currently decline likely secondary to recurrent pnuemonia; WBC ct of 22.4; presented with fever of 102F that has improved.  - Procal 1.88. - Continue cefepime & vancomycin.   2. HFpEF / Pulmonary hypertension w/ RV failure  - TTE on 10/11/23 w/ LVEF of 55% and severely enlarged RV w/ severely reduced function.  - RHC 8/23 with RA 1, PA mean 25, PCWP 4, CI 3, PVR 3.4WU - Continue adempas 2mg  TID & apixaban 5mg  BID (hx of PE) - Continue midodrine 15mg  TID; maintain MAP > 65 for RV perfusion.  - 300cc urine output recorded;  - mildly hypervolemic on exam; IV lasix 80mg  x 1.   3. COPD - PFTs w/ obstructive lung disease and severely reduced DLCO - stable on exam today  - albuterol q4H PRN  4. CKD III - sCr baseline of 1.5-1.6 - sCr 1.13 today   Joshua [name] Advanced Heart Failure Mechanical Circulatory Support

## 2023-10-26 NOTE — Evaluation (Signed)
 Modified Barium Swallow Study  Patient Details  Name: PORFIRIO BOLLIER MRN: [id number] Date of Birth: 10/25/1935  Today's Date: 10/26/2023  Modified Barium Swallow completed.  Full report located under Chart Review in the Imaging Section.  History of Present Illness AREL TIPPEN is a 88 y.o. male with PMH significant for COPD on 2 L O2 at home, pulmonary hypertension, CHF, chronic PE on Eliquis, CKD, chronic hypotension on midodrine, HLD, OSA, hiatal hernia, BPH, chronic urinary retention, lives at home with wife, and patient, able to ambulate independent. 3/8, patient was brought to the ED from home with complaint of chills, nausea, generalized weakness for a day.    Recently hospitalized 2/21 to 3/3 for septic shock secondary to bilateral pneumonia, suspected aspiration and ultimately discharged home, prior to completion of MBS which was recommended at that time. Chest x-ray read as worsening left lower lobe infiltrate. Pateint seen by SLP services at bedside in 2025 and 2022, both without overt s/s of aspiration.   Clinical Impression Patient presents with a mild pharyngeal phase dysphagia characterized by delayed swallow initiation and decreased laryngeal closure resulting in penetration of viscosities thinner than honey thick. Both thin and nectar thick liquids penetrated, with thin liquid penetrating to the level of the vocal cords without observed evidence of sensation. Chin tuck and supraglottic swallow did not assist to decrease. Penetration episodes decreased in quantity and frequency with use of small single cup sips. Throat clear and dry swallow was effective to clear the airway before aspiraiton observed. Recommend the diet below with close aherence to swallowing precautions.SLP will f/u for education and reinforced use of precautions. Factors that may increase risk of adverse event in presence of aspiration Rubye Oaks & Clearance Coots 2021): Poor general health and/or compromised  immunity;Respiratory or GI disease  Swallow Evaluation Recommendations Recommendations: PO diet PO Diet Recommendation: Regular;Thin liquids (Level 0) Liquid Administration via: Cup;No straw Medication Administration: Whole meds with puree Supervision: Patient able to self-feed;Full supervision/cueing for swallowing strategies Swallowing strategies  : Slow rate;Small bites/sips;Clear throat intermittently Postural changes: Position pt fully upright for meals Oral care recommendations: Oral care BID (2x/day)     Ramey Schiff MA, CCC-SLP  Bowe Sidor Meryl 10/26/2023,1:13 PM

## 2023-10-26 NOTE — Evaluation (Signed)
 Physical Therapy Evaluation Patient Details Name: Joshua [name] MRN: [id number] DOB: 28-Feb-1936 Today's Date: 10/26/2023  History of Present Illness  Pleasant 6 yoM from home  presenting from home with onset of chills, nausea (no vomiting) and generalized weakness since this am.  Denies SOB, CP, LE swelling, pain, dysphagia, orthopnea, change in bowel habits. Self urinary caths up to TID prn for years, denies urinary symptoms; Recent admit 2/21- 3/3 with septic shock 2/2 bilateral PNA, suspected aspiration, discharged home on midodrinel lives at home with wife and was feeling stronger, not having to use walker until day of admission; with PMH of COPD/ emphysema, chronic hypoxic respiratory failure on 2-3L O2, DM, CKD 3a, HFpEF, PH, chronic PE on eliquis, chronic hypotension on midodrine, OSA (declines CPAP), and hiatal hernia  Clinical Impression   Pt admitted with above diagnosis. Lives at home with spouse, in a two-level home with a few steps to enter; Prior to admission, pt was able to walk without an assistive device, was recovering from previous hospitalization; Presents to PT with decr functional capacity,  generalized weakness, a functional decline compared to baseline;  Supervision to get to EOB, CGA for sit to stand, and for hallway amb with RW; Pt currently with functional limitations due to the deficits listed below (see PT Problem List). Pt will benefit from skilled PT to increase their independence and safety with mobility to allow discharge to the venue listed below.           If plan is discharge home, recommend the following: A little help with walking and/or transfers;A little help with bathing/dressing/bathroom;Help with stairs or ramp for entrance   Can travel by private vehicle        Equipment Recommendations Rolling walker (2 wheels)  Recommendations for Other Services  Other (comment) (Mobility Team)    Functional Status Assessment Patient has had a recent decline in  their functional status and demonstrates the ability to make significant improvements in function in a reasonable and predictable amount of time.     Precautions / Restrictions Precautions Precautions: Fall Precaution/Restrictions Comments: watch SpO2 Restrictions Weight Bearing Restrictions Per Provider Order: No      Mobility  Bed Mobility Overal bed mobility: Needs Assistance Bed Mobility: Supine to Sit     Supine to sit: Supervision     General bed mobility comments: Supervision mroe for lines and leads    Transfers Overall transfer level: Needs assistance Equipment used: Rolling walker (2 wheels) Transfers: Sit to/from Stand Sit to Stand: Contact guard assist           General transfer comment: Slwo rise, but no physical assist needed    Ambulation/Gait Ambulation/Gait assistance: Contact guard assist Gait Distance (Feet): 150 Feet Assistive device: Rolling walker (2 wheels) Gait Pattern/deviations: Decreased stride length, Step-through pattern       General Gait Details: increased time, cues to self-monitor for activity tolerance; started amb on 2 L supplemental O2, had to incr to 6 L, then to 8L with amb  Stairs            Wheelchair Mobility     Tilt Bed    Modified Rankin (Stroke Patients Only)       Balance     Sitting balance-Leahy Scale: Fair       Standing balance-Leahy Scale: Poor  Pertinent Vitals/Pain Pain Assessment Pain Assessment: No/denies pain    Home Living Family/patient expects to be discharged to:: Private residence Living Arrangements: Spouse/significant other;Parent Available Help at Discharge: Family;Available 24 hours/day Type of Home: House Home Access: Stairs to enter Entrance Stairs-Rails: Right Entrance Stairs-Number of Steps: 6 Alternate Level Stairs-Number of Steps: 6+landing+6+landing+4 Home Layout: Two level;Bed/bath upstairs Home Equipment: Rolling  Walker (2 wheels);Rollator (4 wheels);Shower seat Additional Comments: supplemental 2LO2 at baselinee    Prior Function Prior Level of Function : Independent/Modified Independent;Driving             Mobility Comments: until recent admission in Feb 2025, denies current use of AD, drives ADLs Comments: independent, driving     Extremity/Trunk Assessment   Upper Extremity Assessment Upper Extremity Assessment: Generalized weakness    Lower Extremity Assessment Lower Extremity Assessment: Generalized weakness    Cervical / Trunk Assessment Cervical / Trunk Assessment: Normal  Communication   Communication Communication: No apparent difficulties    Cognition Arousal: Alert Behavior During Therapy: WFL for tasks assessed/performed   PT - Cognitive impairments: No apparent impairments                         Following commands: Intact       Cueing Cueing Techniques: Verbal cues     General Comments General comments (skin integrity, edema, etc.): Difficulty getting a good pleth wave during session, but did require incr supplemental O2    Exercises     Assessment/Plan    PT Assessment Patient needs continued PT services  PT Problem List Decreased strength;Decreased activity tolerance;Decreased balance;Decreased mobility       PT Treatment Interventions DME instruction;Gait training;Stair training;Functional mobility training;Therapeutic activities;Therapeutic exercise;Balance training    PT Goals (Current goals can be found in the Care Plan section)  Acute Rehab PT Goals Patient Stated Goal: home PT Goal Formulation: With patient Time For Goal Achievement: 11/10/23 Potential to Achieve Goals: Good    Frequency Min 2X/week     Co-evaluation               AM-PAC PT "6 Clicks" Mobility  Outcome Measure Help needed turning from your back to your side while in a flat bed without using bedrails?: None Help needed moving from lying on your  back to sitting on the side of a flat bed without using bedrails?: None Help needed moving to and from a bed to a chair (including a wheelchair)?: A Little Help needed standing up from a chair using your arms (e.g., wheelchair or bedside chair)?: A Little Help needed to walk in hospital room?: A Little Help needed climbing 3-5 steps with a railing? : A Lot 6 Click Score: 19    End of Session Equipment Utilized During Treatment: Gait belt;Oxygen Activity Tolerance: Patient tolerated treatment well Patient left: with call bell/phone within reach;in chair;with chair alarm set Nurse Communication: Mobility status PT Visit Diagnosis: Unsteadiness on feet (R26.81)    Time: 1420-1450 PT Time Calculation (min) (ACUTE ONLY): 30 min   Charges:   PT Evaluation $PT Eval Moderate Complexity: 1 Mod PT Treatments $Gait Training: 8-22 mins PT General Charges $$ ACUTE PT VISIT: 1 Visit         Van Clines, PT  Acute Rehabilitation Services Office [phone] Secure Chat welcomed   Levi Aland 10/26/2023, 4:39 PM

## 2023-10-26 NOTE — Plan of Care (Signed)
   Problem: Education: Goal: Knowledge of General Education information will improve Description: Including pain rating scale, medication(s)/side effects and non-pharmacologic comfort measures Outcome: Completed/Met

## 2023-10-26 NOTE — Progress Notes (Signed)
 PROGRESS NOTE  Joshua [name]  DOB: 1936/07/01  PCP: [name], DO ZOX:[id number]  DOA: 10/25/2023  LOS: 1 day  Hospital Day: 2  Brief narrative: Joshua [name] is a 88 y.o. male with PMH significant for COPD on 2 L O2 at home, pulmonary hypertension, CHF, chronic PE on Eliquis, CKD, chronic hypotension on midodrine, HLD, OSA, hiatal hernia, BPH, chronic urinary retention, does self cath 3 times daily as needed for years. Lives at home with wife, and patient, able to ambulate independent. 3/8, patient was brought to the ED from home with complaint of chills, nausea, generalized weakness for a day  Recently hospitalized 2/21 to 3/3 for septic shock secondary to bilateral pneumonia, suspected aspiration and ultimately discharged home.  In the ED, patient had an initial temperature of 102, heart rate 105, tachypneic to 30s, required up to 10 L high flow oxygen by nasal cannula.  Initial blood pressure was low in 80s. Initial labs with WC count 13, lactic acid 2.7>2, BUN/creatinine 23/1.54 Resp virus panel negative for COVID, flu, RSV Chest x-ray read as worsening left lower lobe infiltrate. Blood culture was sent, Empiric IV antibiotics started While in the ED, patient's blood pressure remained low and dropped further low to 70s PCCM was consulted Patient was started on peripheral Levophed and admitted to ICU  Overnight, blood pressure improved.  Levophed was discontinued and patient was transferred to [facility] this morning 3/9  Subjective: Patient was seen and examined this morning.  Elderly African-American male.  Lying on bed.  On limited oxygen.  Not in distress.  Family not at bedside. Patient does self cath at home, does not have his kit here and wants to use Foley catheter.  Alert, awake, oriented x 3 and able to have a meaningful conversation. He states that during his last hospitalization as well, Foley catheter was inserted and was removed at discharge. Chart reviewed. No  fever spike overnight, heart rate in 80s, blood pressure in 110s, on 6 L oxygen this morning Labs this morning with WBC count 22.4, hemoglobin 11.9  Assessment and plan: Septic shock POA Left lower lobe pneumonia Presented with fever, shortness of breath in the setting of hiatal hernia, recurrent aspiration pneumonia and recent hospitalization for the same Blood pressure was low in 70s requiring pressors and overnight ICU stay WBC count, lactic acid level, procalcitonin level elevated Chest x-ray with pneumonia Started on IV antibiotics Hemodynamically improving.  Pressors stopped Continue IV antibiotics Monitor WBC count, lactate trend  Recent Labs  Lab 10/25/23 1219 10/25/23 1248 10/25/23 1406 10/25/23 2254 10/26/23 0424  WBC 13.0*  --   --   --  22.4*  LATICACIDVEN  --  2.7* 2.0*  --   --   PROCALCITON  --   --   --  1.85 1.88   Recurrent aspiration pneumonia Patient denied aspiration tendency.  However could be having silent aspiration.   Speech eval pending  Acute on chronic respiratory failure On 2 L oxygen at home.  Initially required up to 10 L oxygen.  Secondary to pneumonia  on 6 L this morning.  Wean down as tolerated Check ambulatory oxygen requirement DNI.  Okay to try BiPAP if needed.  COPD Severe emphysema per recent CT chest Bronchodilators  H/o OSA Not interested in CPAP  Hiatal hernia PPI, Reglan  Chronic hypotension Continued on midodrine 15 mg 3 times daily  Chronic diastolic CHF Pulmonary hypertension with cor pulmonale Volume status okay PTA meds- Adempas 2 mg 3 times  daily, torsemide 20 mg daily, Farxiga 10 mg daily  Chronic PE Eliquis  CKD 3A Creatinine at baseline. Recent Labs    03/14/23 0039 03/15/23 0430 07/04/23 0922 10/10/23 0935 10/11/23 0842 10/13/23 0226 10/14/23 0158 10/15/23 0249 10/25/23 1219 10/26/23 0424  BUN 18 22 30* 35* 28* 19 20 28* 23 18  CREATININE 1.24 1.08 1.58* 1.94* 1.48* 0.98 1.18 1.21 1.54* 1.13    BPH Chronic urinary retention Does cath 3 times daily as needed Wants Foley catheter while in the hospital. Continue Proscar  Peripheral neuropathy Gabapentin 300 mg 3 times daily  Mobility: Encourage ambulation.  States he is independent  Goals of care   Code Status: Limited: Do not attempt resuscitation (DNR) -DNR-LIMITED -Do Not Intubate/DNI      DVT prophylaxis:   apixaban (ELIQUIS) tablet 5 mg   Antimicrobials: IV cefepime, IV vancomycin Fluid: None Consultants: None Family Communication: None at bedside  Status: Inpatient Level of care:  ICU   Patient is from: Home Needs to continue in-hospital care: Pending clinical course Anticipated d/c to: Hopefully home in 1 to 2 days     Diet:  Diet Order             Diet NPO time specified Except for: Sips with Meds  Diet effective now                   Scheduled Meds:  allopurinol  100 mg Oral q morning   apixaban  5 mg Oral BID   arformoterol  15 mcg Nebulization BID   And   umeclidinium bromide  1 puff Inhalation Daily   atorvastatin  40 mg Oral Daily   Chlorhexidine Gluconate Cloth  6 each Topical Daily   feeding supplement  237 mL Oral BID BM   finasteride  5 mg Oral QHS   gabapentin  100 mg Oral TID   metoCLOPramide  5 mg Oral TID AC   midodrine  15 mg Oral TID WC   multivitamin with minerals  1 tablet Oral Daily   pantoprazole  40 mg Oral q morning   potassium & sodium phosphates  2 packet Oral TID WC & HS   Riociguat  2 mg Oral TID    PRN meds: albuterol, azelastine, docusate sodium, polyethylene glycol   Infusions:   ceFEPime (MAXIPIME) IV Stopped (10/26/23 0121)   magnesium sulfate bolus IVPB      Antimicrobials: Anti-infectives (From admission, onward)    Start     Dose/Rate Route Frequency Ordered Stop   10/26/23 1400  vancomycin (VANCOCIN) IVPB 1000 mg/200 mL premix  Status:  Discontinued        1,000 mg 200 mL/hr over 60 Minutes Intravenous Every 24 hours 10/25/23 1847  10/26/23 0957   10/26/23 0100  ceFEPIme (MAXIPIME) 2 g in sodium chloride 0.9 % 100 mL IVPB        2 g 200 mL/hr over 30 Minutes Intravenous Every 12 hours 10/25/23 1854     10/25/23 2200  piperacillin-tazobactam (ZOSYN) IVPB 3.375 g  Status:  Discontinued        3.375 g 12.5 mL/hr over 240 Minutes Intravenous Every 8 hours 10/25/23 1847 10/25/23 1854   10/25/23 1315  vancomycin (VANCOREADY) IVPB 1500 mg/300 mL        1,500 mg 150 mL/hr over 120 Minutes Intravenous  Once 10/25/23 1313 10/25/23 1600   10/25/23 1300  vancomycin (VANCOCIN) IVPB 1000 mg/200 mL premix  Status:  Discontinued  1,000 mg 200 mL/hr over 60 Minutes Intravenous  Once 10/25/23 1257 10/25/23 1313   10/25/23 1300  ceFEPIme (MAXIPIME) 2 g in sodium chloride 0.9 % 100 mL IVPB        2 g 200 mL/hr over 30 Minutes Intravenous  Once 10/25/23 1257 10/25/23 1400       Objective: Vitals:   10/26/23 0900 10/26/23 0906  BP: 130/67   Pulse: 90 93  Resp: (!) 25 (!) 22  Temp:    SpO2: 96% 95%    Intake/Output Summary (Last 24 hours) at 10/26/2023 1043 Last data filed at 10/26/2023 0600 Gross per 24 hour  Intake 2255.48 ml  Output 300 ml  Net 1955.48 ml   Filed Weights   10/25/23 1210 10/25/23 2300 10/26/23 0500  Weight: 83 kg 83.5 kg 85.1 kg   Weight change:  Body mass index is 23.45 kg/m.   Physical Exam: General exam: Pleasant, elderly African-American male. Skin: No rashes, lesions or ulcers. HEENT: Atraumatic, normocephalic, no obvious bleeding Lungs: Clear to auscultation bilaterally,  CVS: S1, S2, no murmur,   GI/Abd: Soft, nontender, nondistended, bowel sound present,   CNS: Alert, awake, oriented x 3 Psychiatry: Mood appropriate,  Extremities: No pedal edema, no calf tenderness,   Data Review: I have personally reviewed the laboratory data and studies available.  F/u labs  Unresulted Labs (From admission, onward)     Start     Ordered   10/27/23 0500  CBC with Differential/Platelet   Tomorrow morning,   R       Question:  Specimen collection method  Answer:  Lab=Lab collect   10/26/23 1043   10/27/23 0500  Basic metabolic panel  Tomorrow morning,   R       Question:  Specimen collection method  Answer:  Lab=Lab collect   10/26/23 1043   10/27/23 0500  Lactic acid, plasma  (Lactic Acid)  Tomorrow morning,   R       Question:  Specimen collection method  Answer:  Lab=Lab collect   10/26/23 1043   10/25/23 1834  Urinalysis, Routine w reflex microscopic -Urine, Clean Catch  Once,   R       Question:  Specimen Source  Answer:  Urine, Clean Catch   10/25/23 1833   10/25/23 1829  Strep pneumoniae urinary antigen  Once,   R        10/25/23 1828            Total time spent in review of labs and imaging, patient evaluation, formulation of plan, documentation and communication with family: 55 minutes  Signed, Lorin Glass, MD [facility] 10/26/2023

## 2023-10-26 NOTE — Progress Notes (Signed)
 Planning for MBS today. RN asking about meds. At this time patient is NPO pending MBS. This SLP has not seen at bedside however based on previous SLP notes and the fact that patient is alert, interactive, patient can likely consume meds either crushed or whole in puree pending MBS.   Krystian Younglove MA, CCC-SLP

## 2023-10-26 NOTE — Progress Notes (Signed)
 NAME:  Joshua [name], MRN:  [id number], DOB:  1936-04-20, LOS: 1 ADMISSION DATE:  10/25/2023, CONSULTATION DATE:  10/25/23 REFERRING MD:  Dr. Wilkie Aye, CHIEF COMPLAINT:  SOB/ weakness   History of Present Illness:   Pleasant 48 yoM from home with PMH of COPD/ emphysema, chronic hypoxic respiratory failure on 2-3L O2, DM, CKD 3a, HFpEF, PH, chronic PE on eliquis, chronic hypotension on midodrine, OSA (declines CPAP), and hiatal hernia presenting from home with onset of chills, nausea (no vomiting) and generalized weakness since this am.  Denies SOB, CP, LE swelling, pain, dysphagia, orthopnea, change in bowel habits. Self urinary caths up to TID prn for years, denies urinary symptoms.  No change in cough, occasionally pink in am, otherwise white.  No sick contacts, lives at home with wife and until today feeling stronger, not having to use walker.   Recent admit 2/21- 3/3 with septic shock 2/2 bilateral PNA, suspected aspiration, discharged home on midodrine.  Compliant with meds.    In ER, temp 102, SBP 80-90 with MAPs 55-60, sats 94% on 5L in no resp distress.  WBC 13, lactic 2.7> 2, SARS/ flu/ RSV neg, LFTs ok, sCr 1.54 (previously 1.21).  CXR with worsening left lower infiltrate.  Placed on peripheral NE and PCCM called for admit.   Pertinent  Medical History  COPD/ emphysema, chronic hypoxic respiratory failure on 2-3L O2, DM, CKD 3a, HFpEF, PH, chronic PE on eliquis, aspiraiton PNA, chronic hypotension on midodrine, OSA  Significant Hospital Events: Including procedures, antibiotic start and stop dates in addition to other pertinent events     Interim History / Subjective:   -Norepinephrine weaned to off 3/8 evening -5 L/min nasal cannula  Objective   Blood pressure (!) 125/59, pulse 93, temperature 99.1 F (37.3 C), temperature source Oral, resp. rate (!) 22, height 6\' 3"  (1.905 m), weight 85.1 kg, SpO2 95%.        Intake/Output Summary (Last 24 hours) at 10/26/2023 0948 Last data  filed at 10/26/2023 0600 Gross per 24 hour  Intake 2255.48 ml  Output 300 ml  Net 1955.48 ml   Filed Weights   10/25/23 1210 10/25/23 2300 10/26/23 0500  Weight: 83 kg 83.5 kg 85.1 kg    Examination: General: Comfortable, sitting up in bed, no distress HEENT: Oropharynx clear, strong voice, no secretions Neuro: Awake, alert, appropriate, nonfocal CV: Regular, 3/6 systolic murmur PULM: Clear bilaterally, somewhat decreased on the left.  No crackles or wheezes GI: Nondistended with positive bowel sounds Extremities: Trace lower extremity edema Skin: No rash  Resolved Hospital Problem list    Assessment & Plan:   LLL PNA versus pulmonary edema - concern for aspiration PNA (either silent aspiration vs 2/2 hiatial hernia) r/o other infectious symptoms Chronic hypoxic respiratory failure  Hx COPD/ emphysema, doubt AE Hx OSA- not interested in CPAP - was evaluated by SLP, but resp status prevented MBS.  Will reconsult given suspected recurrent asp P:  -Wean O2 as able -Okay to continue empiric antibiotics for now. Stop vanco w negative MRSA swab pending cx data. Question whether his infiltrates are residual from previous admission versus other cause including possible pulmonary edema, pneumonitis.  At risk aspiration as above -Appreciate SLP evaluation.  Modified barium swallow ordered -PPI -BD as ordered -Confirmed DNI status on 3/8.  Would except BiPAP but no indication to do so at this time -Follow intermittent chest x-ray  Septic shock secondary to recurrent PNA.  UA reassuring.  Note significant leukocytosis Chronic hypotension on  midodrine -Norepinephrine weaned to off -Continue home midodrine -Antibiotics as above for possible pneumonia/sepsis  HFpHF Pulmonary hypertension with cor pulmonale (WHO 3&4) Hx PE -Follow I/O, volume status.  Does not appear to be overall volume overloaded although he does have bilateral waxing and waning pulmonary infiltrates. -Reinitiate  diuresis either 3/9 or 3/10 given improvement in his hemodynamics -Adempas, Farxiga temporarily on hold.  Likely restart 3/10 -Continue Eliquis  CKD 3a -Follow BMP, urine output  Hypomagnesemia Hypophosphatemia -Repeating on 3/9  GERD/ hiatal hernia -On PPI. -Continue home Reglan  Appreciate TRH taking over on 3/9.  Call if we can assist further  Best Practice (right click and "Reselect all SmartList Selections" daily)   Diet/type: NPO w/ oral meds DVT prophylaxis DOAC Pressure ulcer(s): N/A GI prophylaxis: PPI Lines: N/A Foley:  N/A Code Status:  DNR/ DNI- ok with pressors/ BiPAP if needed Last date of multidisciplinary goals of care discussion [3/8]  Labs   CBC: Recent Labs  Lab 10/25/23 1219 10/26/23 0424  WBC 13.0* 22.4*  NEUTROABS 11.4*  --   HGB 12.6* 11.9*  HCT 39.7 37.3*  MCV 85.7 85.6  PLT 235 192    Basic Metabolic Panel: Recent Labs  Lab 10/25/23 1219 10/26/23 0424  NA 140 137  K 4.0 3.6  CL 97* 104  CO2 32 27  GLUCOSE 102* 78  BUN 23 18  CREATININE 1.54* 1.13  CALCIUM 9.4 8.9  MG  --  1.4*  PHOS  --  2.0*   GFR: Estimated Creatinine Clearance: 57.1 mL/min (by C-G formula based on SCr of 1.13 mg/dL). Recent Labs  Lab 10/25/23 1219 10/25/23 1248 10/25/23 1406 10/25/23 2254 10/26/23 0424  PROCALCITON  --   --   --  1.85 1.88  WBC 13.0*  --   --   --  22.4*  LATICACIDVEN  --  2.7* 2.0*  --   --     Liver Function Tests: Recent Labs  Lab 10/25/23 1219  AST 29  ALT 21  ALKPHOS 74  BILITOT 0.8  PROT 6.2*  ALBUMIN 3.0*   No results for input(s): "LIPASE", "AMYLASE" in the last 168 hours. No results for input(s): "AMMONIA" in the last 168 hours.  ABG    Component Value Date/Time   PHART 7.364 07/16/2022 0217   PCO2ART 59.2 (H) 07/16/2022 0217   PO2ART 63 (L) 07/16/2022 0217   HCO3 33.0 (H) 07/16/2022 0217   TCO2 35 (H) 07/16/2022 0217   O2SAT 87 07/16/2022 0217     Coagulation Profile: Recent Labs  Lab  10/25/23 1257  INR 1.6*    Cardiac Enzymes: No results for input(s): "CKTOTAL", "CKMB", "CKMBINDEX", "TROPONINI" in the last 168 hours.  HbA1C: Hgb A1c MFr Bld  Date/Time Value Ref Range Status  10/10/2023 04:48 PM 5.5 4.8 - 5.6 % Final    Comment:    (NOTE) Pre diabetes:          5.7%-6.4%  Diabetes:              >6.4%  Glycemic control for   <7.0% adults with diabetes   03/13/2023 04:40 PM 5.7 (H) 4.8 - 5.6 % Final    Comment:    (NOTE) Pre diabetes:          5.7%-6.4%  Diabetes:              >6.4%  Glycemic control for   <7.0% adults with diabetes     CBG: Recent Labs  Lab 10/25/23 2238  GLUCAP  81     Critical care time: NA    Levy Pupa, MD, PhD 10/26/2023, 9:48 AM [facility] [phone] or if no answer before 7:00PM call [phone] For any issues after 7:00PM please call eLink [phone]

## 2023-10-26 NOTE — Plan of Care (Signed)
  Problem: Education: Goal: Knowledge of General Education information will improve Description: Including pain rating scale, medication(s)/side effects and non-pharmacologic comfort measures Outcome: Progressing   Problem: Clinical Measurements: Goal: Ability to maintain clinical measurements within normal limits will improve Outcome: Progressing Goal: Will remain free from infection Outcome: Progressing Goal: Diagnostic test results will improve Outcome: Progressing Goal: Respiratory complications will improve Outcome: Progressing Goal: Cardiovascular complication will be avoided Outcome: Progressing   Problem: Activity: Goal: Risk for activity intolerance will decrease Outcome: Progressing   Problem: Nutrition: Goal: Adequate nutrition will be maintained Outcome: Progressing   Problem: Safety: Goal: Ability to remain free from injury will improve Outcome: Progressing   Problem: Skin Integrity: Goal: Risk for impaired skin integrity will decrease Outcome: Progressing   

## 2023-10-26 NOTE — Progress Notes (Signed)
 New Admission Note: Transfer 14M  Arrival Method: Wheelchair Mental Orientation: Alert and oriented x 4 Telemetry: Box 03 Assessment: Completed Skin: Warm and dry IV: NSLs Pain: Denies Tubes: Foley Cath Safety Measures: Safety Fall Prevention Plan initiated.  Admission: Completed 5 M  Orientation: Patient has been orientated to the room, unit and the staff. Welcome booklet given.  Family: None  Orders have been reviewed and implemented. Will continue to monitor the patient. Call light has been placed within reach and bed alarm has been activated.   Guilford Shi BSN, RN  Phone Number: [phone]

## 2023-10-27 DIAGNOSIS — A419 Sepsis, unspecified organism: Secondary | ICD-10-CM | POA: Diagnosis not present

## 2023-10-27 DIAGNOSIS — R6521 Severe sepsis with septic shock: Secondary | ICD-10-CM | POA: Diagnosis not present

## 2023-10-27 DIAGNOSIS — I50810 Right heart failure, unspecified: Secondary | ICD-10-CM | POA: Diagnosis not present

## 2023-10-27 LAB — CBC WITH DIFFERENTIAL/PLATELET
Abs Immature Granulocytes: 0.06 10*3/uL (ref 0.00–0.07)
Basophils Absolute: 0 10*3/uL (ref 0.0–0.1)
Basophils Relative: 0 %
Eosinophils Absolute: 0.2 10*3/uL (ref 0.0–0.5)
Eosinophils Relative: 2 %
HCT: 35.1 % — ABNORMAL LOW (ref 39.0–52.0)
Hemoglobin: 11.5 g/dL — ABNORMAL LOW (ref 13.0–17.0)
Immature Granulocytes: 1 %
Lymphocytes Relative: 5 %
Lymphs Abs: 0.7 10*3/uL (ref 0.7–4.0)
MCH: 27.3 pg (ref 26.0–34.0)
MCHC: 32.8 g/dL (ref 30.0–36.0)
MCV: 83.2 fL (ref 80.0–100.0)
Monocytes Absolute: 1.3 10*3/uL — ABNORMAL HIGH (ref 0.1–1.0)
Monocytes Relative: 10 %
Neutro Abs: 10.7 10*3/uL — ABNORMAL HIGH (ref 1.7–7.7)
Neutrophils Relative %: 82 %
Platelets: 194 10*3/uL (ref 150–400)
RBC: 4.22 MIL/uL (ref 4.22–5.81)
RDW: 17.5 % — ABNORMAL HIGH (ref 11.5–15.5)
WBC: 12.9 10*3/uL — ABNORMAL HIGH (ref 4.0–10.5)
nRBC: 0 % (ref 0.0–0.2)

## 2023-10-27 LAB — BASIC METABOLIC PANEL
Anion gap: 11 (ref 5–15)
BUN: 17 mg/dL (ref 8–23)
CO2: 27 mmol/L (ref 22–32)
Calcium: 9.1 mg/dL (ref 8.9–10.3)
Chloride: 97 mmol/L — ABNORMAL LOW (ref 98–111)
Creatinine, Ser: 1.19 mg/dL (ref 0.61–1.24)
GFR, Estimated: 60 mL/min — ABNORMAL LOW (ref >60)
Glucose, Bld: 95 mg/dL (ref 70–99)
Potassium: 3.4 mmol/L — ABNORMAL LOW (ref 3.5–5.1)
Sodium: 135 mmol/L (ref 135–145)

## 2023-10-27 LAB — LACTIC ACID, PLASMA: Lactic Acid, Venous: 0.8 mmol/L (ref 0.5–1.9)

## 2023-10-27 MED ORDER — FUROSEMIDE 10 MG/ML IJ SOLN
60.0000 mg | Freq: Once | INTRAMUSCULAR | Status: AC
  Administered 2023-10-27: 60 mg via INTRAVENOUS
  Filled 2023-10-27: qty 6

## 2023-10-27 MED ORDER — POTASSIUM CHLORIDE CRYS ER 20 MEQ PO TBCR
40.0000 meq | EXTENDED_RELEASE_TABLET | ORAL | Status: AC
  Administered 2023-10-27 (×2): 40 meq via ORAL
  Filled 2023-10-27 (×2): qty 2

## 2023-10-27 NOTE — TOC Initial Note (Signed)
 Transition of Care [facility]) - Initial/Assessment Note    Patient Details  Name: Joshua [name] MRN: [id number] Date of Birth: 02/20/1936  [facility]) CM/SW Contact:    Nicanor Bake Phone Number: [phone] 10/27/2023, 2:36 PM  Clinical Narrative:  [facility] CSW called met with pt and wife at bedside. Pt stated that he lives with spouse. Pty stated that he has current [facility] services, but cannot think of the agency he has services through. Pt stated that he uses oxygen at home. Pts wife stated that they have other equipment at home but the pt does not use any of it. Pt stated that he has a scale at home. Pt stated he has a PCP. CSW explained that a hospital follow up appointment is typically scheduled closer towards discharge. Pts wife stated that he already has an upcoming PCP appt. Pts wife stated that she will provide transportation at dc.   Hospital follow up appointment scheduled for Tuesday, November 04, 2023 at 3:00 PM.  TOC will continue following.                   Expected Discharge Plan: Home w Home Health Services Barriers to Discharge: Continued Medical Work up   Patient Goals and CMS Choice            Expected Discharge Plan and Services       Living arrangements for the past 2 months: Single Family Home                                      Prior Living Arrangements/Services Living arrangements for the past 2 months: Single Family Home Lives with:: Spouse Patient language and need for interpreter reviewed:: Yes Do you feel safe going back to the place where you live?: Yes      Need for Family Participation in Patient Care: Yes (Comment) Care giver support system in place?: Yes (comment)   Criminal Activity/Legal Involvement Pertinent to Current Situation/Hospitalization: No - Comment as needed  Activities of Daily Living   ADL Screening (condition at time of admission) Independently performs ADLs?: No Does the patient have a NEW  difficulty with bathing/dressing/toileting/self-feeding that is expected to last >3 days?: No Does the patient have a NEW difficulty with getting in/out of bed, walking, or climbing stairs that is expected to last >3 days?: No Does the patient have a NEW difficulty with communication that is expected to last >3 days?: No Is the patient deaf or have difficulty hearing?: No Does the patient have difficulty seeing, even when wearing glasses/contacts?: No Does the patient have difficulty concentrating, remembering, or making decisions?: No  Permission Sought/Granted                  Emotional Assessment Appearance:: Appears stated age Attitude/Demeanor/Rapport: Engaged Affect (typically observed): Appropriate Orientation: : Oriented to Self, Oriented to Place, Oriented to  Time, Oriented to Situation Alcohol / Substance Use: Not Applicable Psych Involvement: No (comment)  Admission diagnosis:  AKI (acute kidney injury) (HCC) [N17.9] Septic shock (HCC) [A41.9, R65.21] Pneumonia of left lower lobe due to infectious organism [J18.9] Patient Active Problem List   Diagnosis Date Noted   Septic shock (HCC) 10/25/2023   DMII (diabetes mellitus, type 2) (HCC) 10/15/2023   Acute on chronic respiratory failure with hypoxia (HCC) 10/10/2023   DNR (do not resuscitate) discussion 10/10/2023   Community acquired bilateral lower  lobe pneumonia 10/10/2023   Chronic obstructive pulmonary disease with acute lower respiratory infection (HCC) 08/04/2023   CTEPH (chronic thromboembolic pulmonary hypertension) (HCC) 08/04/2023   Type 2 diabetes mellitus with hyperglycemia (HCC) 08/04/2023   Chronic cor pulmonale (HCC) 08/04/2023   Recurrent pneumonia 03/14/2023   Abnormal weight loss 07/29/2022   Alcohol abuse 07/29/2022   Constipation 07/29/2022   Gastro-esophageal reflux disease with esophagitis, without bleeding 07/29/2022   Gastroduodenitis 07/29/2022   Imaging of gastrointestinal tract abnormal  07/29/2022   History of colonic polyps 07/29/2022   COVID-19 virus infection 05/20/2022   Hypotension 05/20/2022   Stage 3b chronic kidney disease (CKD) (HCC) - baseline SCr 1.5-1.8 05/20/2022   COPD with emphysema (HCC) 03/19/2022   Dyslipidemia    Chronic diastolic CHF (congestive heart failure) (HCC)    Protein-calorie malnutrition, severe (HCC) 12/26/2021   Palliative care by specialist    Abnormal gait 10/05/2021   Gastroesophageal reflux disease without esophagitis 10/05/2021   Hiatal hernia 10/05/2021   Hiccoughs 10/05/2021   Oxygen dependent 10/05/2021   Prediabetes 10/05/2021   Pure hypercholesterolemia 10/05/2021   Gout 10/04/2021   Asymmetrical sensorineural hearing loss 08/29/2021   Nasal dryness 08/29/2021   Ear stuffiness, bilateral 05/09/2021   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   Right heart failure with reduced right ventricular function (HCC) 04/12/2021   OSA (obstructive sleep apnea) 12/07/2020   Aortic atherosclerosis (HCC) 12/07/2020   Pseudophakia, both eyes 06/07/2019   Pulmonary hypertension (HCC) 09/29/2017   Benign prostatic hyperplasia with lower urinary tract symptoms 08/25/2017   Demand ischemia of myocardium (HCC) 08/25/2017   Arthritis 08/24/2017   Hypertension 02/16/2015   Personal history of pulmonary embolism 10/10/2014   Chronic respiratory failure with hypoxia (HCC) - on home O2 @ 2 L/min 10/10/2014   S/P knee replacement 09/13/2014   PCP:  [name], DO Pharmacy:   [id number] [place], [place] - [address] [place] [address] Phone: [phone] Fax: [phone]  [facility] [place], [place] - [address] [place] [address] Phone: [phone] Fax: [phone]     Social Drivers of Health (SDOH) Social History: SDOH Screenings   Food Insecurity: Patient Declined (10/26/2023)  Housing: Low Risk  (10/12/2023)  Transportation Needs:  Unmet Transportation Needs (10/10/2023)  Utilities: Not At Risk (10/10/2023)  Depression (PHQ2-9): Low Risk  (03/13/2022)  Social Connections: Socially Integrated (10/10/2023)  Stress: No Stress Concern Present (05/09/2021)  Tobacco Use: Medium Risk (10/26/2023)   SDOH Interventions: Transportation Interventions: Inpatient TOC, Intervention Not Indicated, Patient Resources (Friends/Family)   Readmission Risk Interventions    10/27/2023    2:18 PM 10/20/2023    1:41 PM 10/05/2021    3:15 PM  Readmission Risk Prevention Plan  Post Dischage Appt  Complete   Medication Screening  Complete   Transportation Screening Complete Complete Complete  PCP or Specialist Appt within 5-7 Days Complete    Home Care Screening Complete    Medication Review (RN CM) Referral to Pharmacy    Medication Review (RN Care Manager)   Complete  PCP or Specialist appointment within 3-5 days of discharge   Complete  HRI or Home Care Consult   Complete  SW Recovery Care/Counseling Consult   Complete  Palliative Care Screening   Not Applicable  Skilled Nursing Facility   Not Applicable

## 2023-10-27 NOTE — Progress Notes (Signed)
 Advanced Heart Failure Rounding Note  Cardiologist: Marca Ancona, MD  Chief Complaint:  Subjective:    Good response to IV Lasix yesterday w/ 4.2L in UOP.   SCr improving 1.54>>1.19 today, K 3.4.  Feels better. Breathing improved but c/w mild volume overload.   Daughter present at bedside.    Objective:   Weight Range: 85.1 kg Body mass index is 23.45 kg/m.   Vital Signs:   Temp:  [98 F (36.7 C)-98.4 F (36.9 C)] 98.2 F (36.8 C) (03/10 0823) Pulse Rate:  [63-89] 63 (03/10 0903) Resp:  [15-38] 16 (03/10 0903) BP: (83-135)/(49-76) 83/49 (03/10 0823) SpO2:  [77 %-100 %] 96 % (03/10 0903) Last BM Date : 10/26/23  Weight change: Filed Weights   10/25/23 1210 10/25/23 2300 10/26/23 0500  Weight: 83 kg 83.5 kg 85.1 kg    Intake/Output:   Intake/Output Summary (Last 24 hours) at 10/27/2023 0911 Last data filed at 10/27/2023 0981 Gross per 24 hour  Intake 860.83 ml  Output 4175 ml  Net -3314.17 ml      Physical Exam    General:  Well appearing, elderly male. No resp difficulty HEENT: Normal Neck: Supple. JVP 10 cm . Carotids 2+ bilat; no bruits. No lymphadenopathy or thyromegaly appreciated. Cor: PMI nondisplaced. Regular rate & rhythm. No rubs, gallops or murmurs. Lungs: Clear Abdomen: Soft, nontender, nondistended. No hepatosplenomegaly. No bruits or masses. Good bowel sounds. Extremities: No cyanosis, clubbing, rash, 1 + b/l LE edema Neuro: Alert & orientedx3, cranial nerves grossly intact. moves all 4 extremities w/o difficulty. Affect pleasant   Telemetry   NSR 80s, personally reviewed   EKG    No new EKG to review   Labs    CBC Recent Labs    10/25/23 1219 10/26/23 0424 10/27/23 0319  WBC 13.0* 22.4* 12.9*  NEUTROABS 11.4*  --  10.7*  HGB 12.6* 11.9* 11.5*  HCT 39.7 37.3* 35.1*  MCV 85.7 85.6 83.2  PLT 235 192 194   Basic Metabolic Panel Recent Labs    19/14/78 0424 10/27/23 0319  NA 137 135  K 3.6 3.4*  CL 104 97*  CO2  27 27  GLUCOSE 78 95  BUN 18 17  CREATININE 1.13 1.19  CALCIUM 8.9 9.1  MG 1.4*  --   PHOS 2.0*  --    Liver Function Tests Recent Labs    10/25/23 1219  AST 29  ALT 21  ALKPHOS 74  BILITOT 0.8  PROT 6.2*  ALBUMIN 3.0*   No results for input(s): "LIPASE", "AMYLASE" in the last 72 hours. Cardiac Enzymes No results for input(s): "CKTOTAL", "CKMB", "CKMBINDEX", "TROPONINI" in the last 72 hours.  BNP: BNP (last 3 results) Recent Labs    03/13/23 1116 07/04/23 0922 10/10/23 0957  BNP 173.5* 149.9* 354.9*    ProBNP (last 3 results) No results for input(s): "PROBNP" in the last 8760 hours.   D-Dimer No results for input(s): "DDIMER" in the last 72 hours. Hemoglobin A1C No results for input(s): "HGBA1C" in the last 72 hours. Fasting Lipid Panel No results for input(s): "CHOL", "HDL", "LDLCALC", "TRIG", "CHOLHDL", "LDLDIRECT" in the last 72 hours. Thyroid Function Tests No results for input(s): "TSH", "T4TOTAL", "T3FREE", "THYROIDAB" in the last 72 hours.  Invalid input(s): "FREET3"  Other results:   Imaging    DG Swallowing Func-Speech Pathology Result Date: 10/26/2023 Table formatting from the original result was not included. Modified Barium Swallow Study Patient Details Name: JODECI ROARTY MRN: [id number] Date of Birth: [birth year]  Today's Date: 10/26/2023 HPI/PMH: HPI: RAWAD BOCHICCHIO is a 88 y.o. male with PMH significant for COPD on 2 L O2 at home, pulmonary hypertension, CHF, chronic PE on Eliquis, CKD, chronic hypotension on midodrine, HLD, OSA, hiatal hernia, BPH, chronic urinary retention, lives at home with wife, and patient, able to ambulate independent. 3/8, patient was brought to the ED from home with complaint of chills, nausea, generalized weakness for a day.    Recently hospitalized 2/21 to 3/3 for septic shock secondary to bilateral pneumonia, suspected aspiration and ultimately discharged home, prior to completion of MBS which was recommended at that  time. Chest x-ray read as worsening left lower lobe infiltrate. Pateint seen by SLP services at bedside in 2025 and 2022, both without overt s/s of aspiration. Clinical Impression: Clinical Impression: Patient presents with a mild pharyngeal phase dysphagia characterized by delayed swallow initiation and decreased laryngeal closure resulting in penetration of viscosities thinner than honey thick. Both thin and nectar thick liquids penetrated, with thin liquid penetrating to the level of the vocal cords without observed evidence of sensation. Chin tuck and supraglottic swallow did not assist to decrease. Penetration episodes decreased in quantity and frequency with use of small single cup sips. Throat clear and dry swallow was effective to clear the airway before aspiraiton observed. Recommend the diet below with close aherence to swallowing precautions.SLP will f/u for education and reinforced use of precautions. Factors that may increase risk of adverse event in presence of aspiration Rubye Oaks & Clearance Coots 2021): Factors that may increase risk of adverse event in presence of aspiration Rubye Oaks & Clearance Coots 2021): Poor general health and/or compromised immunity; Respiratory or GI disease Recommendations/Plan: Swallowing Evaluation Recommendations Swallowing Evaluation Recommendations Recommendations: PO diet PO Diet Recommendation: Regular; Thin liquids (Level 0) Liquid Administration via: Cup; No straw Medication Administration: Whole meds with puree Supervision: Patient able to self-feed; Full supervision/cueing for swallowing strategies Swallowing strategies  : Slow rate; Small bites/sips; Clear throat intermittently Postural changes: Position pt fully upright for meals Oral care recommendations: Oral care BID (2x/day) Treatment Plan Treatment Plan Treatment recommendations: Therapy as outlined in treatment plan below Follow-up recommendations: Outpatient SLP Functional status assessment: Patient has had a recent  decline in their functional status and demonstrates the ability to make significant improvements in function in a reasonable and predictable amount of time. Treatment frequency: Min 1x/week Treatment duration: 1 week Interventions: Aspiration precaution training; Compensatory techniques; Patient/family education; Trials of upgraded texture/liquids; Diet toleration management by SLP Recommendations Recommendations for follow up therapy are one component of a multi-disciplinary discharge planning process, led by the attending physician.  Recommendations may be updated based on patient status, additional functional criteria and insurance authorization. Assessment: Orofacial Exam: Orofacial Exam Oral Cavity: Oral Hygiene: WFL Oral Cavity - Dentition: Adequate natural dentition Orofacial Anatomy: WFL Oral Motor/Sensory Function: WFL Anatomy: Anatomy: WFL Boluses Administered: Boluses Administered Boluses Administered: Thin liquids (Level 0); Mildly thick liquids (Level 2, nectar thick); Moderately thick liquids (Level 3, honey thick); Puree; Solid  Oral Impairment Domain: Oral Impairment Domain Lip Closure: No labial escape Tongue control during bolus hold: Cohesive bolus between tongue to palatal seal Bolus preparation/mastication: Timely and efficient chewing and mashing Bolus transport/lingual motion: Brisk tongue motion Oral residue: Trace residue lining oral structures Location of oral residue : Tongue Initiation of pharyngeal swallow : Pyriform sinuses  Pharyngeal Impairment Domain: Pharyngeal Impairment Domain Soft palate elevation: No bolus between soft palate (SP)/pharyngeal wall (PW) Laryngeal elevation: Partial superior movement of thyroid cartilage/partial approximation of arytenoids  to epiglottic petiole Anterior hyoid excursion: Partial anterior movement Epiglottic movement: Complete inversion Laryngeal vestibule closure: None, wide column air/contrast in laryngeal vestibule Pharyngeal stripping wave :  Present - complete Pharyngeal contraction (A/P view only): N/A Pharyngoesophageal segment opening: Complete distension and complete duration, no obstruction of flow Tongue base retraction: Trace column of contrast or air between tongue base and PPW Pharyngeal residue: Trace residue within or on pharyngeal structures Location of pharyngeal residue: Valleculae; Tongue base  Esophageal Impairment Domain: Esophageal Impairment Domain Esophageal clearance upright position: Complete clearance, esophageal coating Pill: Pill Consistency administered: Puree Puree: WFL Penetration/Aspiration Scale Score: Penetration/Aspiration Scale Score 1.  Material does not enter airway: Moderately thick liquids (Level 3, honey thick); Puree; Solid; Pill 3.  Material enters airway, remains ABOVE vocal cords and not ejected out: Moderately thick liquids (Level 3, honey thick); Mildly thick liquids (Level 2, nectar thick) 5.  Material enters airway, CONTACTS cords and not ejected out: Thin liquids (Level 0) Compensatory Strategies: Compensatory Strategies Compensatory strategies: Yes Chin tuck: Ineffective Supraglottic swallow: Ineffective   General Information: Caregiver present: No  Diet Prior to this Study: NPO   Temperature : Normal   Respiratory Status: WFL   Supplemental O2: Nasal cannula   History of Recent Intubation: No  Behavior/Cognition: Alert; Cooperative; Pleasant mood Self-Feeding Abilities: Able to self-feed Baseline vocal quality/speech: Dysphonic Volitional Cough: Able to elicit Volitional Swallow: Able to elicit No data recorded Goal Planning: Prognosis for improved oropharyngeal function: Guarded Barriers to Reach Goals: Time post onset No data recorded No data recorded Consulted and agree with results and recommendations: Patient; Nurse Pain: Pain Assessment Pain Assessment: No/denies pain End of Session: Start Time:SLP Start Time (ACUTE ONLY): 1220 Stop Time: SLP Stop Time (ACUTE ONLY): 1240 Time Calculation:SLP Time  Calculation (min) (ACUTE ONLY): 20 min Charges: SLP Evaluations $ SLP Speech Visit: 1 Visit SLP Evaluations $MBS Swallow: 1 Procedure SLP visit diagnosis: SLP Visit Diagnosis: Dysphagia, pharyngeal phase (R13.13) Past Medical History: Past Medical History: Diagnosis Date  Arthritis   BPH (benign prostatic hyperplasia)   CHF (congestive heart failure) (HCC)   Chronic hiccups   COPD (chronic obstructive pulmonary disease) (HCC)   Folliculitis   posterior scalp per office visit note of Dr Selena Batten 07/20/2014    GERD (gastroesophageal reflux disease)   Gout   Hypertension   Hypotension   NSTEMI (non-ST elevated myocardial infarction) (HCC) 03/19/2022  PE (pulmonary thromboembolism) (HCC)   Phlebitis   right arm  at least 20 years ago   Pneumonia   hx of pneumonia as a child   Pulmonary embolism (HCC) 03/17/2022  Pulmonary hypertension (HCC)   Severe sepsis (HCC) 03/16/2022  Sleep apnea  Past Surgical History: Past Surgical History: Procedure Laterality Date  BIOPSY  11/03/2020  Procedure: BIOPSY;  Surgeon: Jeani Hawking, MD;  Location: [facility];  Service: Endoscopy;;  BIOPSY  07/06/2021  Procedure: BIOPSY;  Surgeon: Jeani Hawking, MD;  Location: [facility];  Service: Endoscopy;;  bone removed from little toe right foot     CHOLECYSTECTOMY    ESOPHAGOGASTRODUODENOSCOPY (EGD) WITH PROPOFOL N/A 11/03/2020  Procedure: ESOPHAGOGASTRODUODENOSCOPY (EGD) WITH PROPOFOL;  Surgeon: Jeani Hawking, MD;  Location: [facility];  Service: Endoscopy;  Laterality: N/A;  ESOPHAGOGASTRODUODENOSCOPY (EGD) WITH PROPOFOL N/A 07/06/2021  Procedure: ESOPHAGOGASTRODUODENOSCOPY (EGD) WITH PROPOFOL;  Surgeon: Jeani Hawking, MD;  Location: [facility];  Service: Endoscopy;  Laterality: N/A;  pilonidal cyst removal     RIGHT HEART CATH N/A 12/13/2020  Procedure: RIGHT HEART CATH;  Surgeon: Laurey Morale, MD;  Location: [facility];  Service: Cardiovascular;  Laterality: N/A;  RIGHT/LEFT HEART CATH AND CORONARY ANGIOGRAPHY N/A 03/21/2022   Procedure: RIGHT/LEFT HEART CATH AND CORONARY ANGIOGRAPHY;  Surgeon: Laurey Morale, MD;  Location: [facility] CV [facility];  Service: Cardiovascular;  Laterality: N/A;  TOTAL KNEE ARTHROPLASTY Left 09/13/2014  Procedure: LEFT TOTAL KNEE ARTHROPLASTY;  Surgeon: Shelda Pal, MD;  Location: [facility];  Service: Orthopedics;  Laterality: Left; Ferdinand Lango MA, CCC-SLP McCoy Leah Meryl 10/26/2023, 1:13 PM    Medications:     Scheduled Medications:  allopurinol  100 mg Oral q morning   apixaban  5 mg Oral BID   arformoterol  15 mcg Nebulization BID   And   umeclidinium bromide  1 puff Inhalation Daily   atorvastatin  40 mg Oral Daily   Chlorhexidine Gluconate Cloth  6 each Topical Daily   feeding supplement  237 mL Oral BID BM   finasteride  5 mg Oral QHS   gabapentin  100 mg Oral TID   metoCLOPramide  5 mg Oral TID AC   midodrine  15 mg Oral TID WC   multivitamin with minerals  1 tablet Oral Daily   pantoprazole  40 mg Oral q morning   Riociguat  2 mg Oral TID    Infusions:  ceFEPime (MAXIPIME) IV 2 g (10/27/23 0054)    PRN Medications: albuterol, azelastine, docusate sodium, polyethylene glycol   Assessment/Plan   LLL Pneumonia - Current decline likely secondary to recurrent pnuemonia; admit WBC ct of 22.4; presented with fever of 102F that has improved.  - Procal 1.85 on admit  - much improved w/ abx, WBC down to 12K  - Continue cefepime per IM    2. HFpEF / Pulmonary hypertension w/ RV failure  - TTE on 10/11/23 w/ LVEF of 55% and severely enlarged RV w/ severely reduced function.  - RHC 8/23 with RA 1, PA mean 25, PCWP 4, CI 3, PVR 3.4WU - Continue adempas 2mg  TID & apixaban 5mg  BID (hx of PE) - Continue midodrine 15mg  TID; maintain MAP > 65 for RV perfusion.  - responding well to IV Lasix, 4L in UOP yesterday.  - remains mildly hypervolemic on exam, continue IV Lasix today 60 mg x1. Transition back to PO diuretics tomorrow   3. COPD - PFTs w/ obstructive lung disease and  severely reduced DLCO - stable on exam today  - albuterol q4H PRN   4. CKD III - sCr baseline of 1.5-1.6 - sCr 1.19 today - monitor w/ diuresis   5. Hypokalemia - K 3.4 - give K Supp   6. Hypotension  - continue midodrine 15 tid      Length of Stay: 2  Robbie Lis, PA-C  10/27/2023, 9:11 AM  Advanced Heart Failure Team Pager [phone] (M-F; 7a - 5p)  Please contact [facility] for night-coverage after hours (5p -7a ) and weekends on [email or web address]

## 2023-10-27 NOTE — Plan of Care (Signed)
   Problem: Health Behavior/Discharge Planning: Goal: Ability to manage health-related needs will improve Outcome: Completed/Met

## 2023-10-27 NOTE — TOC CM/SW Note (Signed)
 Transition of Care [facility]) - Inpatient Brief Assessment   Patient Details  Name: Joshua [name] MRN: [id number] Date of Birth: [birth year]  [facility] of [facility]) CM/SW Contact:    Tom-Johnson, Hershal Coria, RN Phone Number: 10/27/2023, 2:20 PM   Clinical Narrative:  Patient presented to the ED with Shakiness, Chills, Nausea, Weakness, and Increased Respirations.  Admitted with Septic Shock. Recently discharged for Bilat Lobe PNA on 10/20/23. Currently on 2L O2 baseline.  Currently on IV abx, Neb tx. Was given one time dose of IV Lasix.  Has hx of COPD/Emphysema, HFpEF, CKD 3a, Chronic Hypoxic Respiratory Failure  DM,  PH, Chronic PE on eliquis, Chronic Hypotension on Midodrine, OSA (declines CPAP), and Hiatal Hernia. HF Team following.   From home with wife. Modified independent with care. Able to drive self. Has two supportive children. Daughter, [name] and Brother at bedside. Has all necessary DME's at home. [employer] supplies home O2.  PCP is [name], DO and uses [facility] on [address].   Home health recommended, patient active with [facility] CM sent resumption of care request to [facility] with acceptance verbalized, info on AVS.   Patient not Medically ready for discharge.  CM will continue to follow as patient progresses with care towards discharge.              Transition of Care Asessment: Insurance and Status: Insurance coverage has been reviewed Patient has primary care physician: Yes Home environment has been reviewed: Yes Prior level of function:: Modified Independent Prior/Current Home Services: Current home services Recruitment consultant) Social Drivers of Health Review: SDOH reviewed no interventions necessary Readmission risk has been reviewed: Yes Transition of care needs: transition of care needs identified, TOC will continue to follow

## 2023-10-27 NOTE — Progress Notes (Signed)
 Mobility Specialist Progress Note:    10/27/23 1217  Mobility  Activity Ambulated with assistance in hallway  Level of Assistance Contact guard assist, steadying assist  Assistive Device Front wheel walker  Distance Ambulated (ft) 200 ft  Activity Response Tolerated well  Mobility Referral Yes  Mobility visit 1 Mobility  Mobility Specialist Start Time (ACUTE ONLY) 0915  Mobility Specialist Stop Time (ACUTE ONLY) 0930  Mobility Specialist Time Calculation (min) (ACUTE ONLY) 15 min   Pt received in bed agreeable to mobility. Pt was on 2L/min, SPO2 was satting at 85% but no SOB. O2 flow increased to 3L/min at rest to keep SPO2 WFL. Ambulated on 8L/min Spo2 91%. Returned to room w/o fault. Situated in bed w/ call bell and personal belongings in reach. Left on 3L/min. RN in room.  Thompson Grayer Mobility Specialist  Please contact vis Secure Chat or  Rehab Office [phone]

## 2023-10-27 NOTE — Progress Notes (Signed)
 Patient noted to be hypotensive with no symptoms BP 83/4 (59) HR 72. Prior to morning midodrine will reassess in an hour  Dr. Pola Corn notified.

## 2023-10-27 NOTE — Progress Notes (Signed)
 PROGRESS NOTE  Joshua [name]  DOB: [birth year]  PCP: [name], DO BJY:[id number]  DOA: 10/25/2023  LOS: 2 days  Hospital Day: 3  Brief narrative: Joshua [name] is a 88 y.o. male with PMH significant for COPD on 2 L O2 at home, pulmonary hypertension, CHF, chronic PE on Eliquis, CKD, chronic hypotension on midodrine, HLD, OSA, hiatal hernia, BPH, chronic urinary retention, does self cath 3 times daily as needed for years. Lives at home with wife, and patient, able to ambulate independent. 3/8, patient was brought to the ED from home with complaint of chills, nausea, generalized weakness for a day  Recently hospitalized 2/21 to 3/3 for septic shock secondary to bilateral pneumonia, suspected aspiration and ultimately discharged home.  In the ED, patient had an initial temperature of 102, heart rate 105, tachypneic to 30s, required up to 10 L high flow oxygen by nasal cannula.  Initial blood pressure was low in 80s. Initial labs with WC count 13, lactic acid 2.7>2, BUN/creatinine 23/1.54 Resp virus panel negative for COVID, flu, RSV Chest x-ray read as worsening left lower lobe infiltrate. Blood culture was sent, Empiric IV antibiotics started While in the ED, patient's blood pressure remained low and dropped further low to 70s PCCM was consulted Patient was started on peripheral Levophed and admitted to ICU  Overnight, blood pressure improved.  Levophed was discontinued and patient was transferred to [facility] this morning 3/9  Subjective: Patient was seen and examined this morning.  Lying on bed.  Not in distress.  Daughter at bedside. Blood pressure was low this morning and 80s. Cardiology team following as well. Labs this morning with WC count improved to 12.9, potassium low at 3.4.  Lactic acid normalized to 0.8  Assessment and plan: Septic shock POA Left lower lobe pneumonia Presented with fever, shortness of breath in the setting of hiatal hernia, recurrent aspiration  pneumonia and recent hospitalization for the same Blood pressure was low in 70s requiring pressors and overnight ICU stay WBC count, lactic acid level, procalcitonin level elevated Chest x-ray with pneumonia Currently on IV antibiotics Hemodynamically improved WBC count improving.  Lactic acid level normalized Modified barium swallow on 3/9 did not suggest any evidence of aspiration. Regular consistency diet with thin liquids recommended.  Recent Labs  Lab 10/25/23 1219 10/25/23 1248 10/25/23 1406 10/25/23 2254 10/26/23 0424 10/27/23 0319  WBC 13.0*  --   --   --  22.4* 12.9*  LATICACIDVEN  --  2.7* 2.0*  --   --  0.8  PROCALCITON  --   --   --  1.85 1.88  --    Acute on chronic respiratory failure On 2 L oxygen at home.  Initially required up to 10 L oxygen.  Currently on low-flow oxygen.  Wean down as tolerated.   Check ambulatory oxygen requirement. DNI.  Okay to try BiPAP if needed.  COPD Severe emphysema per recent CT chest Bronchodilators Follows with pulmonologist Dr. Cathi Roan as an outpatient.  H/o OSA Not interested in CPAP  Hiatal hernia PPI, Reglan  Chronic hypotension Continued on midodrine 15 mg 3 times daily  Chronic diastolic CHF Pulmonary hypertension with cor pulmonale Volume status okay PTA meds- Adempas 2 mg 3 times daily, torsemide 20 mg daily, Farxiga 10 mg daily Cardiology team following.  Given 1 dose of Lasix 60 mg IV this morning.  Noted plan to resume torsemide 40 mg daily tomorrow.  Not on any other GDMT  Hypokalemia, hypomagnesemia, hypophosphatemia Magnesium and phosphorus replaced  yesterday.  Potassium level was low this morning.  Replacement given. Recent Labs  Lab 10/25/23 1219 10/26/23 0424 10/27/23 0319  K 4.0 3.6 3.4*  MG  --  1.4*  --   PHOS  --  2.0*  --    Chronic PE Eliquis  CKD 3A Creatinine at baseline. Recent Labs    03/15/23 0430 07/04/23 0922 10/10/23 0935 10/11/23 0842 10/13/23 0226 10/14/23 0158  10/15/23 0249 10/25/23 1219 10/26/23 0424 10/27/23 0319  BUN 22 30* 35* 28* 19 20 28* 23 18 17   CREATININE 1.08 1.58* 1.94* 1.48* 0.98 1.18 1.21 1.54* 1.13 1.19   BPH Chronic urinary retention Does cath 3 times daily as needed Wants Foley catheter while in the hospital. Continue Proscar  Peripheral neuropathy Gabapentin 300 mg 3 times daily  Mobility: Encourage ambulation.  States he is independent.  Home with PT recommended  Goals of care   Code Status: Limited: Do not attempt resuscitation (DNR) -DNR-LIMITED -Do Not Intubate/DNI      DVT prophylaxis:   apixaban (ELIQUIS) tablet 5 mg   Antimicrobials: IV cefepime Fluid: None Consultants: None Family Communication: Daughter at bedside  Status: Inpatient Level of care:  Telemetry Medical   Patient is from: Home Needs to continue in-hospital care: Pending clinical course Anticipated d/c to: Hopefully home tomorrow.     Diet:  Diet Order             Diet regular Fluid consistency: Thin  Diet effective now                   Scheduled Meds:  allopurinol  100 mg Oral q morning   apixaban  5 mg Oral BID   arformoterol  15 mcg Nebulization BID   And   umeclidinium bromide  1 puff Inhalation Daily   atorvastatin  40 mg Oral Daily   Chlorhexidine Gluconate Cloth  6 each Topical Daily   feeding supplement  237 mL Oral BID BM   finasteride  5 mg Oral QHS   gabapentin  100 mg Oral TID   metoCLOPramide  5 mg Oral TID AC   midodrine  15 mg Oral TID WC   multivitamin with minerals  1 tablet Oral Daily   pantoprazole  40 mg Oral q morning   potassium chloride  40 mEq Oral Q4H   Riociguat  2 mg Oral TID    PRN meds: albuterol, azelastine, docusate sodium, polyethylene glycol   Infusions:   ceFEPime (MAXIPIME) IV 2 g (10/27/23 0054)    Antimicrobials: Anti-infectives (From admission, onward)    Start     Dose/Rate Route Frequency Ordered Stop   10/26/23 1400  vancomycin (VANCOCIN) IVPB 1000 mg/200 mL  premix  Status:  Discontinued        1,000 mg 200 mL/hr over 60 Minutes Intravenous Every 24 hours 10/25/23 1847 10/26/23 0957   10/26/23 0100  ceFEPIme (MAXIPIME) 2 g in sodium chloride 0.9 % 100 mL IVPB        2 g 200 mL/hr over 30 Minutes Intravenous Every 12 hours 10/25/23 1854     10/25/23 2200  piperacillin-tazobactam (ZOSYN) IVPB 3.375 g  Status:  Discontinued        3.375 g 12.5 mL/hr over 240 Minutes Intravenous Every 8 hours 10/25/23 1847 10/25/23 1854   10/25/23 1315  vancomycin (VANCOREADY) IVPB 1500 mg/300 mL        1,500 mg 150 mL/hr over 120 Minutes Intravenous  Once 10/25/23 1313 10/25/23 1600  10/25/23 1300  vancomycin (VANCOCIN) IVPB 1000 mg/200 mL premix  Status:  Discontinued        1,000 mg 200 mL/hr over 60 Minutes Intravenous  Once 10/25/23 1257 10/25/23 1313   10/25/23 1300  ceFEPIme (MAXIPIME) 2 g in sodium chloride 0.9 % 100 mL IVPB        2 g 200 mL/hr over 30 Minutes Intravenous  Once 10/25/23 1257 10/25/23 1400       Objective: Vitals:   10/27/23 0903 10/27/23 0931  BP:  (!) 85/52  Pulse: 63 66  Resp: 16   Temp:    SpO2: 96% 94%    Intake/Output Summary (Last 24 hours) at 10/27/2023 1141 Last data filed at 10/27/2023 0625 Gross per 24 hour  Intake 860.83 ml  Output 4175 ml  Net -3314.17 ml   Filed Weights   10/25/23 1210 10/25/23 2300 10/26/23 0500  Weight: 83 kg 83.5 kg 85.1 kg   Weight change:  Body mass index is 23.45 kg/m.   Physical Exam: General exam: Pleasant, elderly African-American male. Skin: No rashes, lesions or ulcers. HEENT: Atraumatic, normocephalic, no obvious bleeding Lungs: Clear to auscultation bilaterally,  CVS: S1, S2, no murmur,   GI/Abd: Soft, nontender, nondistended, bowel sound present,   CNS: Alert, awake, oriented x 3 Psychiatry: Mood appropriate,  Extremities: No pedal edema, no calf tenderness,   Data Review: I have personally reviewed the laboratory data and studies available.  F/u labs  Unresulted  Labs (From admission, onward)     Start     Ordered   10/28/23 0500  Basic metabolic panel  Tomorrow morning,   R       Question:  Specimen collection method  Answer:  Lab=Lab collect   10/27/23 0920   10/25/23 1834  Urinalysis, Routine w reflex microscopic -Urine, Clean Catch  Once,   R       Question:  Specimen Source  Answer:  Urine, Clean Catch   10/25/23 1833            Total time spent in review of labs and imaging, patient evaluation, formulation of plan, documentation and communication with family: 45 minutes  Signed, Lorin Glass, MD [facility] 10/27/2023

## 2023-10-27 NOTE — Progress Notes (Signed)
 Physical Therapy Treatment  Patient Details Name: Joshua [name] MRN: [id number] DOB: 21-Apr-1936 Today's Date: 10/27/2023   History of Present Illness Pleasant 88 y/o M from home  presenting 10/25/2023 with onset of chills, nausea (no vomiting) and generalized weakness since the am.  Denies SOB, CP, LE swelling, pain, dysphagia, orthopnea, change in bowel habits. Self urinary caths up to TID prn for years, denies urinary symptoms; Recent admit 2/21- 3/3 with septic shock 2/2 bilateral PNA, suspected aspiration, discharged home on midodrinel lives at home with wife and was feeling stronger, not having to use walker until day of admission. PMH significant for COPD/ emphysema, chronic hypoxic respiratory failure on 2-3L O2, DM, CKD 3a, HFpEF, PH, chronic PE on eliquis, chronic hypotension on midodrine, OSA (declines CPAP), and hiatal hernia.    PT Comments  Pt progressing well towards physical therapy goals. Functionally pt requiring CGA for ambulation, however able to complete bed mobility and transfers with mod I to supervision for safety. Focus of session was education for activity pacing and using pulse-ox for visual feedback to start pursed-lip breathing. Sats decreased to 82% at the lowest, and supplemental O2 increased from 3L/min up to 8L/min by end of session. Pt reports no symptoms throughout and HR in mid 80's throughout. Will continue to follow and progress as able per POC.    If plan is discharge home, recommend the following: A little help with walking and/or transfers;A little help with bathing/dressing/bathroom;Help with stairs or ramp for entrance   Can travel by private vehicle        Equipment Recommendations  Rolling walker (2 wheels)    Recommendations for Other Services Other (comment) (Mobility Team)     Precautions / Restrictions Precautions Precautions: Fall Recall of Precautions/Restrictions: Intact Precaution/Restrictions Comments: watch SpO2 Restrictions Weight  Bearing Restrictions Per Provider Order: No     Mobility  Bed Mobility Overal bed mobility: Modified Independent Bed Mobility: Supine to Sit     Supine to sit: HOB elevated     General bed mobility comments: No assist required. Pt mindful of lines especially when bringing LE's back up into bed at end of session.    Transfers Overall transfer level: Needs assistance Equipment used: Rolling walker (2 wheels) Transfers: Sit to/from Stand Sit to Stand: Supervision           General transfer comment: Slow to rise but no physical assist required.    Ambulation/Gait Ambulation/Gait assistance: Contact guard assist Gait Distance (Feet): 200 Feet Assistive device: Rolling walker (2 wheels) Gait Pattern/deviations: Decreased stride length, Step-through pattern Gait velocity: Decreased Gait velocity interpretation: <1.31 ft/sec, indicative of household ambulator   General Gait Details: Cues to self monitor for activity tolerance and O2 status. Pt with pulse-on on throughout gait training so he could have visual feedback, as pt essentially asymptomatic when SpO2 dropped during ambulation. Increased supplemental O2 from 3L/min up to 8L/min by end of gait training to maintain O2 sats >88%   Stairs             Wheelchair Mobility     Tilt Bed    Modified Rankin (Stroke Patients Only)       Balance Overall balance assessment: Needs assistance Sitting-balance support: Feet supported, No upper extremity supported Sitting balance-Leahy Scale: Fair     Standing balance support: During functional activity, Reliant on assistive device for balance, No upper extremity supported Standing balance-Leahy Scale: Poor Standing balance comment: No overt LOB  Communication Communication Communication: No apparent difficulties  Cognition Arousal: Alert Behavior During Therapy: WFL for tasks assessed/performed   PT - Cognitive impairments:  No apparent impairments                       PT - Cognition Comments: per RN pt with potential sundowning. pt currently A&Ox4 however RN from night shift stated some confusion through the night and right when waking up Following commands: Intact      Cueing Cueing Techniques: Verbal cues  Exercises      General Comments        Pertinent Vitals/Pain Pain Assessment Pain Assessment: No/denies pain    Home Living                          Prior Function            PT Goals (current goals can now be found in the care plan section) Acute Rehab PT Goals Patient Stated Goal: home PT Goal Formulation: With patient Time For Goal Achievement: 11/10/23 Potential to Achieve Goals: Good Progress towards PT goals: Progressing toward goals    Frequency    Min 2X/week      PT Plan      Co-evaluation              AM-PAC PT "6 Clicks" Mobility   Outcome Measure  Help needed turning from your back to your side while in a flat bed without using bedrails?: None Help needed moving from lying on your back to sitting on the side of a flat bed without using bedrails?: None Help needed moving to and from a bed to a chair (including a wheelchair)?: A Little Help needed standing up from a chair using your arms (e.g., wheelchair or bedside chair)?: A Little Help needed to walk in hospital room?: A Little Help needed climbing 3-5 steps with a railing? : A Little 6 Click Score: 20    End of Session Equipment Utilized During Treatment: Gait belt;Oxygen Activity Tolerance: Patient tolerated treatment well Patient left: with call bell/phone within reach;in chair;with chair alarm set Nurse Communication: Mobility status PT Visit Diagnosis: Unsteadiness on feet (R26.81)     Time: 1610-9604 PT Time Calculation (min) (ACUTE ONLY): 22 min  Charges:    $Gait Training: 8-22 mins PT General Charges $$ ACUTE PT VISIT: 1 Visit                     Conni Slipper, PT, DPT Acute Rehabilitation Services Secure Chat Preferred Office: [phone]    Marylynn Pearson 10/27/2023, 3:45 PM

## 2023-10-28 ENCOUNTER — Other Ambulatory Visit (HOSPITAL_COMMUNITY): Payer: Self-pay

## 2023-10-28 DIAGNOSIS — A419 Sepsis, unspecified organism: Secondary | ICD-10-CM | POA: Diagnosis not present

## 2023-10-28 DIAGNOSIS — I50810 Right heart failure, unspecified: Secondary | ICD-10-CM | POA: Diagnosis not present

## 2023-10-28 DIAGNOSIS — R6521 Severe sepsis with septic shock: Secondary | ICD-10-CM | POA: Diagnosis not present

## 2023-10-28 LAB — BASIC METABOLIC PANEL
Anion gap: 7 (ref 5–15)
BUN: 15 mg/dL (ref 8–23)
CO2: 30 mmol/L (ref 22–32)
Calcium: 9.3 mg/dL (ref 8.9–10.3)
Chloride: 100 mmol/L (ref 98–111)
Creatinine, Ser: 1.25 mg/dL — ABNORMAL HIGH (ref 0.61–1.24)
GFR, Estimated: 56 mL/min — ABNORMAL LOW (ref >60)
Glucose, Bld: 96 mg/dL (ref 70–99)
Potassium: 4.2 mmol/L (ref 3.5–5.1)
Sodium: 137 mmol/L (ref 135–145)

## 2023-10-28 MED ORDER — TORSEMIDE 20 MG PO TABS
40.0000 mg | ORAL_TABLET | Freq: Every day | ORAL | Status: DC
  Administered 2023-10-28: 40 mg via ORAL
  Filled 2023-10-28: qty 2

## 2023-10-28 MED ORDER — AMOXICILLIN-POT CLAVULANATE 875-125 MG PO TABS: 1.0000 | ORAL_TABLET | Freq: Two times a day (BID) | ORAL | 0 refills | Status: AC

## 2023-10-28 MED ORDER — FLORANEX PO PACK: 1.0000 g | PACK | Freq: Three times a day (TID) | ORAL | 0 refills | Status: AC

## 2023-10-28 NOTE — Progress Notes (Signed)
 Speech Language Pathology Treatment: Dysphagia  Patient Details Name: Joshua [name] MRN: [id number] DOB: [birth year] Today's Date: 10/28/2023 Time: 1914-7829 SLP Time Calculation (min) (ACUTE ONLY): 14 min  Assessment / Plan / Recommendation Clinical Impression  Pt seen for ongoing dysphagia management.  RN reports no throat clear utilized after taking medications.  Pt did recall swallow precautions independently when asked about results of study over weekend on SLP arrival.  He then demonstrated use of intermittent throat clear with snack including regular solids, puree, and thin liquid.  SLP removed straws in room, and his preference is to drink from cup. Pt continued to utilize throat clear indepedently even without direct supervision from SLP while results of study were shared with family.  SLP reviewed imaging in room with daughter to explain rationale for swallow precautions.  Both daughter and pt verbalized understanding.  They do not feel that they need SLP follow up after discharge to reinforce dysphagia precautions or monitor tolerance.   Recommend continuing regular texture diet with thin liquids by small cup sip with intermittent throat clear.     HPI HPI: Joshua [name] is a 88 y.o. male with PMH significant for COPD on 2 L O2 at home, pulmonary hypertension, CHF, chronic PE on Eliquis, CKD, chronic hypotension on midodrine, HLD, OSA, hiatal hernia, BPH, chronic urinary retention, lives at home with wife, and patient, able to ambulate independent. 3/8, patient was brought to the ED from home with complaint of chills, nausea, generalized weakness for a day.    Recently hospitalized 2/21 to 3/3 for septic shock secondary to bilateral pneumonia, suspected aspiration and ultimately discharged home, prior to completion of MBS which was recommended at that time. Chest x-ray read as worsening left lower lobe infiltrate. Pateint seen by SLP services at bedside in 2025 and 2022, both without  overt s/s of aspiration.      SLP Plan  Continue with current plan of care      Recommendations for follow up therapy are one component of a multi-disciplinary discharge planning process, led by the attending physician.  Recommendations may be updated based on patient status, additional functional criteria and insurance authorization.    Recommendations  Diet recommendations: Regular;Thin liquid Liquids provided via: Cup;No straw Medication Administration: Whole meds with puree Supervision: Patient able to self feed Compensations: Clear throat intermittently;Slow rate;Small sips/bites Postural Changes and/or Swallow Maneuvers: Seated upright 90 degrees                  Oral care BID;Oral care before and after PO   None Dysphagia, pharyngeal phase (R13.13)     Continue with current plan of care     Kerrie Pleasure, MA, CCC-SLP Acute Rehabilitation Services Office: [phone] 10/28/2023, 9:43 AM

## 2023-10-28 NOTE — Discharge Summary (Signed)
 Physician Discharge Summary  Joshua [name]:[id number] DOB: 08-Jan-1936 DOA: 10/25/2023  PCP: [name], DO  Admit date: 10/25/2023 Discharge date: 10/28/2023  Admitted From: Home Discharge disposition: Home with home health  Recommendations at discharge:  Complete the course of antibiotics with 5 more days of oral Augmentin with probiotics Resume cardiac meds as before   Brief narrative: Joshua [name] is a 88 y.o. male with PMH significant for COPD on 2 L O2 at home, pulmonary hypertension, CHF, chronic PE on Eliquis, CKD, chronic hypotension on midodrine, HLD, OSA, hiatal hernia, BPH, chronic urinary retention, does self cath 3 times daily as needed for years. Lives at home with wife, and patient, able to ambulate independent. 3/8, patient was brought to the ED from home with complaint of chills, nausea, generalized weakness for a day  Recently hospitalized 2/21 to 3/3 for septic shock secondary to bilateral pneumonia, suspected aspiration and ultimately discharged home.  In the ED, patient had an initial temperature of 102, heart rate 105, tachypneic to 30s, required up to 10 L high flow oxygen by nasal cannula.  Initial blood pressure was low in 80s. Initial labs with WC count 13, lactic acid 2.7>2, BUN/creatinine 23/1.54 Resp virus panel negative for COVID, flu, RSV Chest x-ray read as worsening left lower lobe infiltrate. Blood culture was sent, Empiric IV antibiotics started While in the ED, patient's blood pressure remained low and dropped further low to 70s PCCM was consulted Patient was started on peripheral Levophed and admitted to ICU  Overnight, blood pressure improved.  Levophed was discontinued and patient was transferred to [facility] this morning 3/9  Subjective: Patient was seen and examined this morning.  Lying on bed.  Not in distress.  Daughter at bedside.  Feels ready for discharge today.  Hospital course: Septic shock POA Left lower lobe  pneumonia Presented with fever, shortness of breath in the setting of hiatal hernia, recurrent aspiration pneumonia and recent hospitalization for the same Blood pressure was low in 70s requiring pressors and overnight ICU stay WBC count, lactic acid level, procalcitonin level elevated Chest x-ray with pneumonia.  While in the hospital, he was treated with broad-spectrum IV antibiotics Hemodynamically improved. WBC count improving.  Lactic acid level normalized Modified barium swallow on 3/9 did not suggest any evidence of aspiration. Regular consistency diet with thin liquids recommended. Discharge on 5 more days of oral Augmentin with probiotics.  Recent Labs  Lab 10/25/23 1219 10/25/23 1248 10/25/23 1406 10/25/23 2254 10/26/23 0424 10/27/23 0319  WBC 13.0*  --   --   --  22.4* 12.9*  LATICACIDVEN  --  2.7* 2.0*  --   --  0.8  PROCALCITON  --   --   --  1.85 1.88  --    Acute on chronic respiratory failure On 2 L oxygen at home.  Initially required up to 10 L oxygen.  Currently on low-flow oxygen.  Wean down as tolerated.   Check ambulatory oxygen requirement. DNI.   COPD Severe emphysema per recent CT chest Bronchodilators Follows with pulmonologist Dr. Cathi Roan as an outpatient.  H/o OSA Not interested in CPAP  Hiatal hernia PPI, Reglan  Chronic hypotension Continued on midodrine 15 mg 3 times daily  Chronic diastolic CHF Pulmonary hypertension with cor pulmonale Volume status okay PTA meds- Adempas 2 mg 3 times daily, torsemide 20 mg daily, Farxiga 10 mg daily Cardiology team following.  Given IV Lasix while in the hospital.   Continue same GDMT at discharge.  Hypokalemia, hypomagnesemia, hypophosphatemia Magnesium and phosphorus replaced yesterday.  Potassium level was low this morning.  Replacement given. Recent Labs  Lab 10/25/23 1219 10/26/23 0424 10/27/23 0319 10/28/23 0321  K 4.0 3.6 3.4* 4.2  MG  --  1.4*  --   --   PHOS  --  2.0*  --   --     Chronic PE Eliquis  CKD 3A Creatinine at baseline.  BPH Chronic urinary retention Patient does self-catheterization at home.  At patient's request, Foley catheter was placed to be used in the hospital only.  Removed prior to discharge. Patient will resume self-catheterization at home.  Peripheral neuropathy Gabapentin 300 mg 3 times daily  impaired mobility home with PT recommended  Goals of care   Code Status: Limited: Do not attempt resuscitation (DNR) -DNR-LIMITED -Do Not Intubate/DNI    Consultants: Cardiology, PCCM Family Communication: Daughter at bedside  Diet:  Diet Order             Diet general           Diet regular Fluid consistency: Thin  Diet effective now                   Nutritional status:  Body mass index is 22.57 kg/m.       Wounds:  -    Discharge Exam:   Vitals:   10/27/23 2124 10/28/23 0526 10/28/23 0750 10/28/23 0825  BP: (!) 100/55 (!) 97/53 (!) 96/54 (!) 97/55  Pulse: 62 69 68 71  Resp: 18 18  16   Temp: (!) 97.5 F (36.4 C) 98.5 F (36.9 C) 98.2 F (36.8 C) 98.2 F (36.8 C)  TempSrc: Oral Oral Oral Oral  SpO2: 96% 100% 95% 97%  Weight:      Height:        Body mass index is 22.57 kg/m.  General exam: Pleasant, elderly African-American male. Skin: No rashes, lesions or ulcers. HEENT: Atraumatic, normocephalic, no obvious bleeding Lungs: Clear to auscultation bilaterally,  CVS: S1, S2, no murmur,   GI/Abd: Soft, nontender, nondistended, bowel sound present,   CNS: Alert, awake, oriented x 3 Psychiatry: Mood appropriate,  Extremities: No pedal edema, no calf tenderness,   Follow ups:    Follow-up Information     [name], DO. Go in 7 day(s).   Specialty: Family Medicine Why: Hospital follow up appointment scheduled for Tuesday, November 04, 2023 at 3:00 PM. PLEASE ARRIVE 10-15 minutes early.  PLEASE call to cancel/reschedule if you CANNOT make it. Contact information: [address] [place] [address] [phone]         Care, [facility] Follow up.   Specialty: Home Health Services Why: Someone will call you for resumption of care visit. Contact information: [address] [place] [address] [phone]                 Discharge Instructions:   Discharge Instructions     Call MD for:  difficulty breathing, headache or visual disturbances   Complete by: As directed    Call MD for:  extreme fatigue   Complete by: As directed    Call MD for:  hives   Complete by: As directed    Call MD for:  persistant dizziness or light-headedness   Complete by: As directed    Call MD for:  persistant nausea and vomiting   Complete by: As directed    Call MD for:  severe uncontrolled pain  Complete by: As directed    Call MD for:  temperature >100.4   Complete by: As directed    Diet general   Complete by: As directed    Discharge instructions   Complete by: As directed    Recommendations at discharge:   Complete the course of antibiotics with 5 more days of oral Augmentin with probiotics  Resume cardiac meds as before  Discharge instructions for CHF Check weight daily -preferably same time every day. Restrict fluid intake to 1200 ml daily Restrict salt intake to less than 2 g daily. Call MD if you have one of the following symptoms 1) 3 pound weight gain in 24 hours or 5 pounds in 1 week  2) swelling in the hands, feet or stomach  3) progressive shortness of breath 4) if you have to sleep on extra pillows at night in order to breathe       General discharge instructions: Follow with Primary MD [name], DO in 7 days  Please request your PCP  to go over your hospital tests, procedures, radiology results at the follow up. Please get your medicines reviewed and adjusted.  Your PCP may decide to repeat certain labs or tests as needed. Do not drive, operate heavy machinery, perform activities at heights, swimming  or participation in water activities or provide baby sitting services if your were admitted for syncope or siezures until you have seen by Primary MD or a Neurologist and advised to do so again. [place] Controlled Substance Reporting System database was reviewed. Do not drive, operate heavy machinery, perform activities at heights, swim, participate in water activities or provide baby-sitting services while on medications for pain, sleep and mood until your outpatient physician has reevaluated you and advised to do so again.  You are strongly recommended to comply with the dose, frequency and duration of prescribed medications. Activity: As tolerated with Full fall precautions use walker/cane & assistance as needed Avoid using any recreational substances like cigarette, tobacco, alcohol, or non-prescribed drug. If you experience worsening of your admission symptoms, develop shortness of breath, life threatening emergency, suicidal or homicidal thoughts you must seek medical attention immediately by calling 911 or calling your MD immediately  if symptoms less severe. You must read complete instructions/literature along with all the possible adverse reactions/side effects for all the medicines you take and that have been prescribed to you. Take any new medicine only after you have completely understood and accepted all the possible adverse reactions/side effects.  Wear Seat belts while driving. You were cared for by a hospitalist during your hospital stay. If you have any questions about your discharge medications or the care you received while you were in the hospital after you are discharged, you can call the unit and ask to speak with the hospitalist or the covering physician. Once you are discharged, your primary care physician will handle any further medical issues. Please note that NO REFILLS for any discharge medications will be authorized once you are discharged, as it is imperative that you  return to your primary care physician (or establish a relationship with a primary care physician if you do not have one).   Increase activity slowly   Complete by: As directed        Discharge Medications:   Allergies as of 10/28/2023       Reactions   Other Swelling   Beer- Swelling    Sunflower Oil Swelling   Sulfa Antibiotics Rash  Medication List     TAKE these medications    Adempas 2 MG Tabs Generic drug: Riociguat TAKE 1 TABLET BY MOUTH 3 TIMES A DAY AS DIRECTED. DO NOT HANDLE IF PREGNANT   albuterol 108 (90 Base) MCG/ACT inhaler Commonly known as: VENTOLIN HFA Inhale 1 puff into the lungs every 4 (four) hours as needed for shortness of breath.   allopurinol 100 MG tablet Commonly known as: ZYLOPRIM Take 100 mg by mouth every morning.   amoxicillin-clavulanate 875-125 MG tablet Commonly known as: AUGMENTIN Take 1 tablet by mouth 2 (two) times daily for 5 days.   atorvastatin 40 MG tablet Commonly known as: LIPITOR TAKE 1 TABLET BY MOUTH EVERY DAY   azelastine 0.1 % nasal spray Commonly known as: ASTELIN Place 2 sprays into both nostrils 2 (two) times daily as needed for rhinitis. Use in each nostril as directed   Cinnamon 500 MG capsule Take 500 mg by mouth every morning.   dapagliflozin propanediol 10 MG Tabs tablet Commonly known as: Farxiga Take 1 tablet (10 mg total) by mouth daily before breakfast.   Eliquis 5 MG Tabs tablet Generic drug: apixaban TAKE 1 TABLET BY MOUTH TWICE A DAY   finasteride 5 MG tablet Commonly known as: PROSCAR Take 5 mg by mouth at bedtime.   gabapentin 300 MG capsule Commonly known as: NEURONTIN Take 300 mg by mouth 3 (three) times daily.   lactobacillus Pack Take 1 packet (1 g total) by mouth 3 (three) times daily with meals for 7 days.   magnesium oxide 400 (240 Mg) MG tablet Commonly known as: MAG-OX TAKE 1 TABLET BY MOUTH EVERY DAY   metoCLOPramide 5 MG tablet Commonly known as: REGLAN Take 5 mg  by mouth 3 (three) times daily before meals.   midodrine 5 MG tablet Commonly known as: PROAMATINE TAKE 3 TABLETS BY MOUTH 3 TIMES DAILY. What changed: See the new instructions.   multivitamin with minerals Tabs tablet Take 1 tablet by mouth daily.   OXYGEN Inhale 2 L into the lungs continuous.   pantoprazole 40 MG tablet Commonly known as: PROTONIX Take 1 tablet (40 mg total) by mouth every morning.   potassium chloride 10 MEQ tablet Commonly known as: KLOR-CON Take 10 mEq by mouth 2 (two) times daily.   Stiolto Respimat 2.5-2.5 MCG/ACT Aers Generic drug: Tiotropium Bromide-Olodaterol Inhale 2 puffs into the lungs daily. What changed: how much to take   torsemide 20 MG tablet Commonly known as: DEMADEX Take 20 mg by mouth daily.         The results of significant diagnostics from this hospitalization (including imaging, microbiology, ancillary and laboratory) are listed below for reference.    Procedures and Diagnostic Studies:   DG Swallowing Func-Speech Pathology Result Date: 10/26/2023 Table formatting from the original result was not included. Modified Barium Swallow Study Patient Details Name: FULTON MERRY MRN: [id number] Date of Birth: [birth year] Today's Date: 10/26/2023 HPI/PMH: HPI: KACYN SOUDER is a 88 y.o. male with PMH significant for COPD on 2 L O2 at home, pulmonary hypertension, CHF, chronic PE on Eliquis, CKD, chronic hypotension on midodrine, HLD, OSA, hiatal hernia, BPH, chronic urinary retention, lives at home with wife, and patient, able to ambulate independent. 3/8, patient was brought to the ED from home with complaint of chills, nausea, generalized weakness for a day.    Recently hospitalized 2/21 to 3/3 for septic shock secondary to bilateral pneumonia, suspected aspiration and ultimately discharged home, prior to completion of MBS which  was recommended at that time. Chest x-ray read as worsening left lower lobe infiltrate. Pateint seen by SLP  services at bedside in 2025 and 2022, both without overt s/s of aspiration. Clinical Impression: Clinical Impression: Patient presents with a mild pharyngeal phase dysphagia characterized by delayed swallow initiation and decreased laryngeal closure resulting in penetration of viscosities thinner than honey thick. Both thin and nectar thick liquids penetrated, with thin liquid penetrating to the level of the vocal cords without observed evidence of sensation. Chin tuck and supraglottic swallow did not assist to decrease. Penetration episodes decreased in quantity and frequency with use of small single cup sips. Throat clear and dry swallow was effective to clear the airway before aspiraiton observed. Recommend the diet below with close aherence to swallowing precautions.SLP will f/u for education and reinforced use of precautions. Factors that may increase risk of adverse event in presence of aspiration Rubye Oaks & Clearance Coots 2021): Factors that may increase risk of adverse event in presence of aspiration Rubye Oaks & Clearance Coots 2021): Poor general health and/or compromised immunity; Respiratory or GI disease Recommendations/Plan: Swallowing Evaluation Recommendations Swallowing Evaluation Recommendations Recommendations: PO diet PO Diet Recommendation: Regular; Thin liquids (Level 0) Liquid Administration via: Cup; No straw Medication Administration: Whole meds with puree Supervision: Patient able to self-feed; Full supervision/cueing for swallowing strategies Swallowing strategies  : Slow rate; Small bites/sips; Clear throat intermittently Postural changes: Position pt fully upright for meals Oral care recommendations: Oral care BID (2x/day) Treatment Plan Treatment Plan Treatment recommendations: Therapy as outlined in treatment plan below Follow-up recommendations: Outpatient SLP Functional status assessment: Patient has had a recent decline in their functional status and demonstrates the ability to make significant  improvements in function in a reasonable and predictable amount of time. Treatment frequency: Min 1x/week Treatment duration: 1 week Interventions: Aspiration precaution training; Compensatory techniques; Patient/family education; Trials of upgraded texture/liquids; Diet toleration management by SLP Recommendations Recommendations for follow up therapy are one component of a multi-disciplinary discharge planning process, led by the attending physician.  Recommendations may be updated based on patient status, additional functional criteria and insurance authorization. Assessment: Orofacial Exam: Orofacial Exam Oral Cavity: Oral Hygiene: WFL Oral Cavity - Dentition: Adequate natural dentition Orofacial Anatomy: WFL Oral Motor/Sensory Function: WFL Anatomy: Anatomy: WFL Boluses Administered: Boluses Administered Boluses Administered: Thin liquids (Level 0); Mildly thick liquids (Level 2, nectar thick); Moderately thick liquids (Level 3, honey thick); Puree; Solid  Oral Impairment Domain: Oral Impairment Domain Lip Closure: No labial escape Tongue control during bolus hold: Cohesive bolus between tongue to palatal seal Bolus preparation/mastication: Timely and efficient chewing and mashing Bolus transport/lingual motion: Brisk tongue motion Oral residue: Trace residue lining oral structures Location of oral residue : Tongue Initiation of pharyngeal swallow : Pyriform sinuses  Pharyngeal Impairment Domain: Pharyngeal Impairment Domain Soft palate elevation: No bolus between soft palate (SP)/pharyngeal wall (PW) Laryngeal elevation: Partial superior movement of thyroid cartilage/partial approximation of arytenoids to epiglottic petiole Anterior hyoid excursion: Partial anterior movement Epiglottic movement: Complete inversion Laryngeal vestibule closure: None, wide column air/contrast in laryngeal vestibule Pharyngeal stripping wave : Present - complete Pharyngeal contraction (A/P view only): N/A Pharyngoesophageal  segment opening: Complete distension and complete duration, no obstruction of flow Tongue base retraction: Trace column of contrast or air between tongue base and PPW Pharyngeal residue: Trace residue within or on pharyngeal structures Location of pharyngeal residue: Valleculae; Tongue base  Esophageal Impairment Domain: Esophageal Impairment Domain Esophageal clearance upright position: Complete clearance, esophageal coating Pill: Pill Consistency administered: Puree Puree: [facility]  Penetration/Aspiration Scale Score: Penetration/Aspiration Scale Score 1.  Material does not enter airway: Moderately thick liquids (Level 3, honey thick); Puree; Solid; Pill 3.  Material enters airway, remains ABOVE vocal cords and not ejected out: Moderately thick liquids (Level 3, honey thick); Mildly thick liquids (Level 2, nectar thick) 5.  Material enters airway, CONTACTS cords and not ejected out: Thin liquids (Level 0) Compensatory Strategies: Compensatory Strategies Compensatory strategies: Yes Chin tuck: Ineffective Supraglottic swallow: Ineffective   General Information: Caregiver present: No  Diet Prior to this Study: NPO   Temperature : Normal   Respiratory Status: WFL   Supplemental O2: Nasal cannula   History of Recent Intubation: No  Behavior/Cognition: Alert; Cooperative; Pleasant mood Self-Feeding Abilities: Able to self-feed Baseline vocal quality/speech: Dysphonic Volitional Cough: Able to elicit Volitional Swallow: Able to elicit No data recorded Goal Planning: Prognosis for improved oropharyngeal function: Guarded Barriers to Reach Goals: Time post onset No data recorded No data recorded Consulted and agree with results and recommendations: Patient; Nurse Pain: Pain Assessment Pain Assessment: No/denies pain End of Session: Start Time:SLP Start Time (ACUTE ONLY): 1220 Stop Time: SLP Stop Time (ACUTE ONLY): 1240 Time Calculation:SLP Time Calculation (min) (ACUTE ONLY): 20 min Charges: SLP Evaluations $ SLP Speech Visit:  1 Visit SLP Evaluations $MBS Swallow: 1 Procedure SLP visit diagnosis: SLP Visit Diagnosis: Dysphagia, pharyngeal phase (R13.13) Past Medical History: Past Medical History: Diagnosis Date  Arthritis   BPH (benign prostatic hyperplasia)   CHF (congestive heart failure) (HCC)   Chronic hiccups   COPD (chronic obstructive pulmonary disease) (HCC)   Folliculitis   posterior scalp per office visit note of Dr Selena Batten 07/20/2014    GERD (gastroesophageal reflux disease)   Gout   Hypertension   Hypotension   NSTEMI (non-ST elevated myocardial infarction) (HCC) 03/19/2022  PE (pulmonary thromboembolism) (HCC)   Phlebitis   right arm  at least 20 years ago   Pneumonia   hx of pneumonia as a child   Pulmonary embolism (HCC) 03/17/2022  Pulmonary hypertension (HCC)   Severe sepsis (HCC) 03/16/2022  Sleep apnea  Past Surgical History: Past Surgical History: Procedure Laterality Date  BIOPSY  11/03/2020  Procedure: BIOPSY;  Surgeon: Jeani Hawking, MD;  Location: [facility];  Service: Endoscopy;;  BIOPSY  07/06/2021  Procedure: BIOPSY;  Surgeon: Jeani Hawking, MD;  Location: [facility];  Service: Endoscopy;;  bone removed from little toe right foot     CHOLECYSTECTOMY    ESOPHAGOGASTRODUODENOSCOPY (EGD) WITH PROPOFOL N/A 11/03/2020  Procedure: ESOPHAGOGASTRODUODENOSCOPY (EGD) WITH PROPOFOL;  Surgeon: Jeani Hawking, MD;  Location: [facility];  Service: Endoscopy;  Laterality: N/A;  ESOPHAGOGASTRODUODENOSCOPY (EGD) WITH PROPOFOL N/A 07/06/2021  Procedure: ESOPHAGOGASTRODUODENOSCOPY (EGD) WITH PROPOFOL;  Surgeon: Jeani Hawking, MD;  Location: [facility];  Service: Endoscopy;  Laterality: N/A;  pilonidal cyst removal     RIGHT HEART CATH N/A 12/13/2020  Procedure: RIGHT HEART CATH;  Surgeon: Laurey Morale, MD;  Location: [facility] INVASIVE CV LAB;  Service: Cardiovascular;  Laterality: N/A;  RIGHT/LEFT HEART CATH AND CORONARY ANGIOGRAPHY N/A 03/21/2022  Procedure: RIGHT/LEFT HEART CATH AND CORONARY ANGIOGRAPHY;  Surgeon: Laurey Morale,  MD;  Location: [facility] INVASIVE CV LAB;  Service: Cardiovascular;  Laterality: N/A;  TOTAL KNEE ARTHROPLASTY Left 09/13/2014  Procedure: LEFT TOTAL KNEE ARTHROPLASTY;  Surgeon: Shelda Pal, MD;  Location: [facility];  Service: Orthopedics;  Laterality: Left; Ferdinand Lango MA, CCC-SLP McCoy Leah Meryl 10/26/2023, 1:13 PM  DG Chest Portable 1 View Result Date: 10/25/2023 CLINICAL DATA:  infection EXAM: PORTABLE  CHEST 1 VIEW COMPARISON:  October 13, 2023, October 10, 2023 FINDINGS: The cardiomediastinal silhouette is unchanged in contour.Atherosclerotic calcifications. No pleural effusion. No pneumothorax. Patchy predominately LEFT-sided airspace opacities, mildly increased in comparison to prior. RIGHT basilar reticular nodularity is mildly improved. IMPRESSION: 1. Mildly increased patchy LEFT-sided airspace opacities. Differential considerations include aspiration atelectasis or infection. Recommend follow-up PA and lateral chest radiograph in 4-6 weeks to assess for resolution. 2. Mildly improved RIGHT basilar reticular nodularity. Electronically Signed   By: Meda Klinefelter M.D.   On: 10/25/2023 13:02     Labs:   Basic Metabolic Panel: Recent Labs  Lab 10/25/23 1219 10/26/23 0424 10/27/23 0319 10/28/23 0321  NA 140 137 135 137  K 4.0 3.6 3.4* 4.2  CL 97* 104 97* 100  CO2 32 27 27 30   GLUCOSE 102* 78 95 96  BUN 23 18 17 15   CREATININE 1.54* 1.13 1.19 1.25*  CALCIUM 9.4 8.9 9.1 9.3  MG  --  1.4*  --   --   PHOS  --  2.0*  --   --    GFR Estimated Creatinine Clearance: 50.1 mL/min (A) (by C-G formula based on SCr of 1.25 mg/dL (H)). Liver Function Tests: Recent Labs  Lab 10/25/23 1219  AST 29  ALT 21  ALKPHOS 74  BILITOT 0.8  PROT 6.2*  ALBUMIN 3.0*   No results for input(s): "LIPASE", "AMYLASE" in the last 168 hours. No results for input(s): "AMMONIA" in the last 168 hours. Coagulation profile Recent Labs  Lab 10/25/23 1257  INR 1.6*    CBC: Recent Labs  Lab 10/25/23 1219  10/26/23 0424 10/27/23 0319  WBC 13.0* 22.4* 12.9*  NEUTROABS 11.4*  --  10.7*  HGB 12.6* 11.9* 11.5*  HCT 39.7 37.3* 35.1*  MCV 85.7 85.6 83.2  PLT 235 192 194   Cardiac Enzymes: No results for input(s): "CKTOTAL", "CKMB", "CKMBINDEX", "TROPONINI" in the last 168 hours. BNP: Invalid input(s): "POCBNP" CBG: Recent Labs  Lab 10/25/23 2238  GLUCAP 81   D-Dimer No results for input(s): "DDIMER" in the last 72 hours. Hgb A1c No results for input(s): "HGBA1C" in the last 72 hours. Lipid Profile No results for input(s): "CHOL", "HDL", "LDLCALC", "TRIG", "CHOLHDL", "LDLDIRECT" in the last 72 hours. Thyroid function studies No results for input(s): "TSH", "T4TOTAL", "T3FREE", "THYROIDAB" in the last 72 hours.  Invalid input(s): "FREET3" Anemia work up No results for input(s): "VITAMINB12", "FOLATE", "FERRITIN", "TIBC", "IRON", "RETICCTPCT" in the last 72 hours. Microbiology Recent Results (from the past 240 hours)  Blood Culture (routine x 2)     Status: None (Preliminary result)   Collection Time: 10/25/23 12:19 PM   Specimen: BLOOD RIGHT HAND  Result Value Ref Range Status   Specimen Description BLOOD RIGHT HAND  Final   Special Requests   Final    BOTTLES DRAWN AEROBIC AND ANAEROBIC Blood Culture results may not be optimal due to an inadequate volume of blood received in culture bottles   Culture   Final    NO GROWTH 3 DAYS Performed at [facility], [address] [place], [place] [address]    Report Status PENDING  Incomplete  Resp panel by RT-PCR (RSV, Flu A&B, Covid) Anterior Nasal Swab     Status: None   Collection Time: 10/25/23 12:57 PM   Specimen: Anterior Nasal Swab  Result Value Ref Range Status   SARS Coronavirus 2 by RT PCR NEGATIVE NEGATIVE Final   Influenza A by PCR NEGATIVE NEGATIVE Final  Influenza B by PCR NEGATIVE NEGATIVE Final    Comment: (NOTE) The Xpert Xpress SARS-CoV-2/FLU/RSV plus assay is intended as an aid in the diagnosis of  influenza from Nasopharyngeal swab specimens and should not be used as a sole basis for treatment. Nasal washings and aspirates are unacceptable for Xpert Xpress SARS-CoV-2/FLU/RSV testing.  Fact Sheet for Patients: BloggerCourse.com  Fact Sheet for Healthcare Providers: SeriousBroker.it  This test is not yet approved or cleared by the [place] FDA and has been authorized for detection and/or diagnosis of SARS-CoV-2 by FDA under an Emergency Use Authorization (EUA). This EUA will remain in effect (meaning this test can be used) for the duration of the COVID-19 declaration under Section 564(b)(1) of the Act, 21 U.S.C. section 360bbb-3(b)(1), unless the authorization is terminated or revoked.     Resp Syncytial Virus by PCR NEGATIVE NEGATIVE Final    Comment: (NOTE) Fact Sheet for Patients: BloggerCourse.com  Fact Sheet for Healthcare Providers: SeriousBroker.it  This test is not yet approved or cleared by the [place] FDA and has been authorized for detection and/or diagnosis of SARS-CoV-2 by FDA under an Emergency Use Authorization (EUA). This EUA will remain in effect (meaning this test can be used) for the duration of the COVID-19 declaration under Section 564(b)(1) of the Act, 21 U.S.C. section 360bbb-3(b)(1), unless the authorization is terminated or revoked.  Performed at [facility], [address] [place], [place] [address]   Blood Culture (routine x 2)     Status: None (Preliminary result)   Collection Time: 10/25/23  1:02 PM   Specimen: BLOOD RIGHT FOREARM  Result Value Ref Range Status   Specimen Description BLOOD RIGHT FOREARM  Final   Special Requests   Final    BOTTLES DRAWN AEROBIC ONLY Blood Culture results may not be optimal due to an inadequate volume of blood received in culture bottles   Culture   Final    NO GROWTH 3 DAYS Performed at  [facility], [address], [place], [place] [address]    Report Status PENDING  Incomplete  MRSA Next Gen by PCR, Nasal     Status: None   Collection Time: 10/25/23  6:29 PM   Specimen: Nasal Mucosa; Nasal Swab  Result Value Ref Range Status   MRSA by PCR Next Gen NOT DETECTED NOT DETECTED Final    Comment: (NOTE) The GeneXpert MRSA Assay (FDA approved for NASAL specimens only), is one component of a comprehensive MRSA colonization surveillance program. It is not intended to diagnose MRSA infection nor to guide or monitor treatment for MRSA infections. Test performance is not FDA approved in patients less than 17 years old. Performed at [facility], [address] [place], [place] [address]   Respiratory (~20 pathogens) panel by PCR     Status: None   Collection Time: 10/25/23  6:37 PM   Specimen: Nasopharyngeal Swab; Respiratory  Result Value Ref Range Status   Adenovirus NOT DETECTED NOT DETECTED Final   Coronavirus 229E NOT DETECTED NOT DETECTED Final    Comment: (NOTE) The Coronavirus on the Respiratory Panel, DOES NOT test for the novel  Coronavirus (2019 nCoV)    Coronavirus HKU1 NOT DETECTED NOT DETECTED Final   Coronavirus NL63 NOT DETECTED NOT DETECTED Final   Coronavirus OC43 NOT DETECTED NOT DETECTED Final   Metapneumovirus NOT DETECTED NOT DETECTED Final   Rhinovirus / Enterovirus NOT DETECTED NOT DETECTED Final   Influenza A NOT DETECTED NOT DETECTED Final  Influenza B NOT DETECTED NOT DETECTED Final   Parainfluenza Virus 1 NOT DETECTED NOT DETECTED Final   Parainfluenza Virus 2 NOT DETECTED NOT DETECTED Final   Parainfluenza Virus 3 NOT DETECTED NOT DETECTED Final   Parainfluenza Virus 4 NOT DETECTED NOT DETECTED Final   Respiratory Syncytial Virus NOT DETECTED NOT DETECTED Final   Bordetella pertussis NOT DETECTED NOT DETECTED Final   Bordetella Parapertussis NOT DETECTED NOT DETECTED Final   Chlamydophila pneumoniae NOT DETECTED NOT DETECTED  Final   Mycoplasma pneumoniae NOT DETECTED NOT DETECTED Final    Comment: Performed at [facility], [address] [place], [place] [address]    Time coordinating discharge: 45 minutes  Signed: Anahid Eskelson  [facility] 10/28/2023, 11:34 AM

## 2023-10-28 NOTE — Progress Notes (Signed)
 DISCHARGE NOTE HOME Joshua [name] to be discharged Home per MD order. Discussed prescriptions and follow up appointments with the patient. Prescriptions given to patient; medication list explained in detail. Patient verbalized understanding.  Skin clean, dry and intact without evidence of skin break down, no evidence of skin tears noted. IV catheter discontinued intact. Site without signs and symptoms of complications. Dressing and pressure applied. Pt denies pain at the site currently. No complaints noted.  Patient free of lines, drains, and wounds.   An After Visit Summary (AVS) was printed and given to the patient. Patient escorted via wheelchair, and discharged home via private auto.  Margarita Grizzle, RN

## 2023-10-28 NOTE — Care Management Important Message (Signed)
 Important Message  Patient Details  Name: Joshua [name] MRN: [id number] Date of Birth: [birth year]   Important Message Given:  Yes - Medicare IM     Dorena Bodo 10/28/2023, 3:03 PM

## 2023-10-28 NOTE — Progress Notes (Signed)
 Physical Therapy Treatment Patient Details Name: Joshua [name] MRN: [id number] DOB: 03-Jan-1936 Today's Date: 10/28/2023   History of Present Illness Pleasant 88 y/o M from home  presenting 10/25/2023 with onset of chills, nausea (no vomiting) and generalized weakness since the am.  Denies SOB, CP, LE swelling, pain, dysphagia, orthopnea, change in bowel habits. Self urinary caths up to TID prn for years, denies urinary symptoms; Recent admit 2/21- 3/3 with septic shock 2/2 bilateral PNA, suspected aspiration, discharged home on midodrinel lives at home with wife and was feeling stronger, not having to use walker until day of admission. PMH significant for COPD/ emphysema, chronic hypoxic respiratory failure on 2-3L O2, DM, CKD 3a, HFpEF, PH, chronic PE on eliquis, chronic hypotension on midodrine, OSA (declines CPAP), and hiatal hernia.    PT Comments  Pt resting in bed on arrival and agreeable to session with continued progress towards acute goals. Session focused on self monitoring of symptoms with reinforcement of education on activity pacing and checking pulse-ox for visual feedback as well as stair negotiation for increased safety with in home mobility. Pt demonstrating bed mobility and transfers at grossly supervision level and able to step up/down single steps with CGA for safety. VSS on 3-4L throughout session. Pt continues to benefit from skilled PT services to progress toward functional mobility goals.     If plan is discharge home, recommend the following: A little help with walking and/or transfers;A little help with bathing/dressing/bathroom;Help with stairs or ramp for entrance   Can travel by private vehicle        Equipment Recommendations  Rolling walker (2 wheels)    Recommendations for Other Services       Precautions / Restrictions Precautions Precautions: Fall Recall of Precautions/Restrictions: Intact Precaution/Restrictions Comments: watch SpO2 Restrictions Weight  Bearing Restrictions Per Provider Order: No     Mobility  Bed Mobility Overal bed mobility: Modified Independent Bed Mobility: Supine to Sit     Supine to sit: HOB elevated     General bed mobility comments: No assist required. Pt mindful of lines    Transfers Overall transfer level: Needs assistance Equipment used: None Transfers: Sit to/from Stand Sit to Stand: Supervision           General transfer comment: no assist needed, steady without UE support    Ambulation/Gait               General Gait Details: deferred for focus on stair training   Stairs   Stairs assistance: Min assist Stair Management: One rail Right, Step to pattern, Forwards Number of Stairs: 7 General stair comments: no assist needed, stepping up/down single step in room with RUE support on elevated bedrail to simulate home   Wheelchair Mobility     Tilt Bed    Modified Rankin (Stroke Patients Only)       Balance Overall balance assessment: Needs assistance Sitting-balance support: Feet supported, No upper extremity supported Sitting balance-Leahy Scale: Fair     Standing balance support: During functional activity, Reliant on assistive device for balance, No upper extremity supported Standing balance-Leahy Scale: Poor Standing balance comment: No overt LOB                            Communication Communication Communication: No apparent difficulties  Cognition Arousal: Alert Behavior During Therapy: WFL for tasks assessed/performed   PT - Cognitive impairments: No apparent impairments  Following commands: Intact      Cueing Cueing Techniques: Verbal cues  Exercises      General Comments General comments (skin integrity, edema, etc.): VSS on supplemental O2      Pertinent Vitals/Pain Pain Assessment Pain Assessment: No/denies pain Faces Pain Scale: No hurt    Home Living                           Prior Function            PT Goals (current goals can now be found in the care plan section) Acute Rehab PT Goals Patient Stated Goal: home PT Goal Formulation: With patient Time For Goal Achievement: 11/10/23 Progress towards PT goals: Progressing toward goals    Frequency    Min 2X/week      PT Plan      Co-evaluation              AM-PAC PT "6 Clicks" Mobility   Outcome Measure  Help needed turning from your back to your side while in a flat bed without using bedrails?: None Help needed moving from lying on your back to sitting on the side of a flat bed without using bedrails?: None Help needed moving to and from a bed to a chair (including a wheelchair)?: A Little Help needed standing up from a chair using your arms (e.g., wheelchair or bedside chair)?: A Little Help needed to walk in hospital room?: A Little Help needed climbing 3-5 steps with a railing? : A Little 6 Click Score: 20    End of Session Equipment Utilized During Treatment: Gait belt;Oxygen Activity Tolerance: Patient tolerated treatment well Patient left: with call bell/phone within reach;in bed;with family/visitor present (seated up EOB) Nurse Communication: Mobility status PT Visit Diagnosis: Unsteadiness on feet (R26.81)     Time: 1610-9604 PT Time Calculation (min) (ACUTE ONLY): 20 min  Charges:    $Therapeutic Exercise: 8-22 mins PT General Charges $$ ACUTE PT VISIT: 1 Visit                     Jemario Poitras R. PTA Acute Rehabilitation Services Office: [phone]   Catalina Antigua 10/28/2023, 11:48 AM

## 2023-10-28 NOTE — Progress Notes (Signed)
 Pharmacy Antibiotic Note  Joshua [name] is a 88 y.o. male admitted on 10/25/2023 with septic shock suspected secondary to pneumonia.  Pharmacy has been consulted for vancomycin and cefepime dosing. Mild AKI on adimssion, now stabilized. Cultures negative.  Plan: Cefepime 2g q12h.    Height: 6\' 3"  (190.5 cm) Weight: 81.9 kg (180 lb 8.9 oz) IBW/kg (Calculated) : 84.5  Temp (24hrs), Avg:98.1 F (36.7 C), Min:97.5 F (36.4 C), Max:98.5 F (36.9 C)  Recent Labs  Lab 10/25/23 1219 10/25/23 1248 10/25/23 1406 10/26/23 0424 10/27/23 0319 10/28/23 0321  WBC 13.0*  --   --  22.4* 12.9*  --   CREATININE 1.54*  --   --  1.13 1.19 1.25*  LATICACIDVEN  --  2.7* 2.0*  --  0.8  --     Estimated Creatinine Clearance: 50.1 mL/min (A) (by C-G formula based on SCr of 1.25 mg/dL (H)).    Allergies  Allergen Reactions   Other Swelling    Beer- Swelling    Sunflower Oil Swelling   Sulfa Antibiotics Rash    Microbiology results: 3/8 BCx:  3/8 MRSA PCR:   Thank you for allowing pharmacy to be a part of this patient's care.  Fredonia Highland, PharmD, BCPS, [facility] Clinical Pharmacist [phone] Please check AMION for all [facility] Pharmacy numbers 10/28/2023

## 2023-10-28 NOTE — TOC Transition Note (Signed)
 Transition of Care [facility]) - Discharge Note   Patient Details  Name: Joshua [name] MRN: [id number] Date of Birth: 1936-06-29  Transition of Care [facility]) CM/SW Contact:  Tom-Johnson, Hershal Coria, RN Phone Number: 10/28/2023, 11:13 AM   Clinical Narrative:     Patient is scheduled for discharge today.  Readmission Risk Assessment done. Home health info, Outpatient f/u, hospital f/u and discharge instructions on AVS. Prescriptions sent to [facility] pharmacy and patient will receive meds prior discharge. No TOC needs or recommendations noted. Wife to transport at discharge.  No further TOC needs noted.     Final next level of care: Home w Home Health Services Barriers to Discharge: Barriers Resolved   Patient Goals and CMS Choice Patient states their goals for this hospitalization and ongoing recovery are:: To return home CMS Medicare.gov Compare Post Acute Care list provided to:: Patient Choice offered to / list presented to : Patient, Adult Children (Daughter, Charlene)      Discharge Placement                Patient to be transferred to facility by: Wife Name of family member notified: Randa Evens    Discharge Plan and Services Additional resources added to the After Visit Summary for                  DME Arranged: N/A (Has necessary DME at home) DME Agency: NA                  Social Drivers of Health (SDOH) Interventions SDOH Screenings   Food Insecurity: Patient Declined (10/26/2023)  Housing: Low Risk  (10/12/2023)  Transportation Needs: Unmet Transportation Needs (10/10/2023)  Utilities: Not At Risk (10/10/2023)  Depression (PHQ2-9): Low Risk  (03/13/2022)  Social Connections: Socially Integrated (10/10/2023)  Stress: No Stress Concern Present (05/09/2021)  Tobacco Use: Medium Risk (10/26/2023)     Readmission Risk Interventions    10/27/2023    2:18 PM 10/20/2023    1:41 PM 10/05/2021    3:15 PM  Readmission Risk Prevention Plan  Post Dischage Appt   Complete   Medication Screening  Complete   Transportation Screening Complete Complete Complete  PCP or Specialist Appt within 5-7 Days Complete    Home Care Screening Complete    Medication Review (RN CM) Referral to Pharmacy    Medication Review (RN Care Manager)   Complete  PCP or Specialist appointment within 3-5 days of discharge   Complete  HRI or Home Care Consult   Complete  SW Recovery Care/Counseling Consult   Complete  Palliative Care Screening   Not Applicable  Skilled Nursing Facility   Not Applicable

## 2023-10-28 NOTE — Progress Notes (Signed)
 Advanced Heart Failure Rounding Note  Cardiologist: Marca Ancona, MD  Chief Complaint: Heart Failure  Subjective:   3/10 Diuresed IV lasix.    Denies SOB.    Objective:   Weight Range: 81.9 kg Body mass index is 22.57 kg/m.   Vital Signs:   Temp:  [97.5 F (36.4 C)-98.5 F (36.9 C)] 98.2 F (36.8 C) (03/11 0825) Pulse Rate:  [62-71] 71 (03/11 0825) Resp:  [16-27] 16 (03/11 0825) BP: (85-100)/(52-55) 97/55 (03/11 0825) SpO2:  [94 %-100 %] 97 % (03/11 0825) Last BM Date : 10/27/23  Weight change: Filed Weights   10/25/23 2300 10/26/23 0500 10/27/23 0701  Weight: 83.5 kg 85.1 kg 81.9 kg    Intake/Output:   Intake/Output Summary (Last 24 hours) at 10/28/2023 0849 Last data filed at 10/28/2023 0800 Gross per 24 hour  Intake 800 ml  Output 2650 ml  Net -1850 ml      Physical Exam   General:  In  bed.  No resp difficulty Neck: supple. no JVD.  Cor: PMI nondisplaced. Regular rate & rhythm. No rubs, gallops or murmurs. Lungs: clear Abdomen: soft, nontender, nondistended.  Extremities: no cyanosis, clubbing, rash, edema Neuro: alert & oriented x3  Telemetry   SR 80s   EKG    No new EKG to review   Labs    CBC Recent Labs    10/25/23 1219 10/26/23 0424 10/27/23 0319  WBC 13.0* 22.4* 12.9*  NEUTROABS 11.4*  --  10.7*  HGB 12.6* 11.9* 11.5*  HCT 39.7 37.3* 35.1*  MCV 85.7 85.6 83.2  PLT 235 192 194   Basic Metabolic Panel Recent Labs    16/10/96 0424 10/27/23 0319 10/28/23 0321  NA 137 135 137  K 3.6 3.4* 4.2  CL 104 97* 100  CO2 27 27 30   GLUCOSE 78 95 96  BUN 18 17 15   CREATININE 1.13 1.19 1.25*  CALCIUM 8.9 9.1 9.3  MG 1.4*  --   --   PHOS 2.0*  --   --    Liver Function Tests Recent Labs    10/25/23 1219  AST 29  ALT 21  ALKPHOS 74  BILITOT 0.8  PROT 6.2*  ALBUMIN 3.0*   No results for input(s): "LIPASE", "AMYLASE" in the last 72 hours. Cardiac Enzymes No results for input(s): "CKTOTAL", "CKMB", "CKMBINDEX",  "TROPONINI" in the last 72 hours.  BNP: BNP (last 3 results) Recent Labs    03/13/23 1116 07/04/23 0922 10/10/23 0957  BNP 173.5* 149.9* 354.9*    ProBNP (last 3 results) No results for input(s): "PROBNP" in the last 8760 hours.   D-Dimer No results for input(s): "DDIMER" in the last 72 hours. Hemoglobin A1C No results for input(s): "HGBA1C" in the last 72 hours. Fasting Lipid Panel No results for input(s): "CHOL", "HDL", "LDLCALC", "TRIG", "CHOLHDL", "LDLDIRECT" in the last 72 hours. Thyroid Function Tests No results for input(s): "TSH", "T4TOTAL", "T3FREE", "THYROIDAB" in the last 72 hours.  Invalid input(s): "FREET3"  Other results:   Imaging    No results found.    Medications:     Scheduled Medications:  allopurinol  100 mg Oral q morning   apixaban  5 mg Oral BID   arformoterol  15 mcg Nebulization BID   And   umeclidinium bromide  1 puff Inhalation Daily   atorvastatin  40 mg Oral Daily   Chlorhexidine Gluconate Cloth  6 each Topical Daily   feeding supplement  237 mL Oral BID BM   finasteride  5 mg Oral QHS   gabapentin  100 mg Oral TID   metoCLOPramide  5 mg Oral TID AC   midodrine  15 mg Oral TID WC   multivitamin with minerals  1 tablet Oral Daily   pantoprazole  40 mg Oral q morning   Riociguat  2 mg Oral TID    Infusions:  ceFEPime (MAXIPIME) IV 2 g (10/28/23 0114)    PRN Medications: albuterol, azelastine, docusate sodium, polyethylene glycol   Assessment/Plan   LLL Pneumonia - Current decline likely secondary to recurrent pnuemonia; admit WBC ct of 22.4; presented with fever of 102F that has improved.  - Procal 1.85 on admit  - much improved w/ abx, WBC down to 12K  - Continue cefepime per IM    2. HFpEF / Pulmonary hypertension w/ RV failure  - TTE on 10/11/23 w/ LVEF of 55% and severely enlarged RV w/ severely reduced function.  - RHC 8/23 with RA 1, PA mean 25, PCWP 4, CI 3, PVR 3.4WU - Continue adempas 2mg  TID & apixaban  5mg  BID (hx of PE) - Continue midodrine 15mg  TID; maintain MAP > 65 for RV perfusion.  - Volume status improved. Stop IV lasix.  Start torsemide 40 mg daily.   - Renal function stable   3. COPD - PFTs w/ obstructive lung disease and severely reduced DLCO - stable on exam today  - albuterol q4H PRN   4. CKD III - sCr baseline of 1.5-1.6 - sCr 1.25 - monitor w/ diuresis   5. Hypokalemia - K 4.2  - give K Supp   6. Hypotension  - continue midodrine 15 tid      Length of Stay: 3  Joshua Jerkins, NP  10/28/2023, 8:49 AM  Advanced Heart Failure Team Pager [phone] (M-F; 7a - 5p)  Please contact [facility] for night-coverage after hours (5p -7a ) and weekends on [email or web address]

## 2023-10-29 ENCOUNTER — Telehealth: Payer: Self-pay

## 2023-10-29 NOTE — Transitions of Care (Post Inpatient/ED Visit) (Signed)
 10/29/2023  Name: Joshua [name] MRN: [id number] DOB: Jan 11, 1936  Today's TOC FU Call Status: Today's TOC FU Call Status:: Successful TOC FU Call Completed TOC FU Call Complete Date: 10/29/23 Patient's Name and Date of Birth confirmed.  Transition Care Management Follow-up Telephone Call Date of Discharge: 10/28/23 Discharge Facility: [facility]) Type of Discharge: Inpatient Admission Primary Inpatient Discharge Diagnosis:: Pneumonia How have you been since you were released from the hospital?: Better Any questions or concerns?: No  Items Reviewed: Did you receive and understand the discharge instructions provided?: Yes Medications obtained,verified, and reconciled?: Yes (Medications Reviewed) Any new allergies since your discharge?: No Dietary orders reviewed?: Yes Type of Diet Ordered:: Low sodium Heart Healthy Do you have support at home?: Yes People in Home: spouse Name of Support/Comfort Primary Source: Salman Wellen  Medications Reviewed Today: Medications Reviewed Today     Reviewed by [facility], RN (Case Manager) on 10/29/23 at 1148  Med List Status: <None>   Medication Order Taking? Sig Documenting Provider Last Dose Status Informant  ADEMPAS 2 MG TABS [id number] No TAKE 1 TABLET BY MOUTH 3 TIMES A DAY AS DIRECTED. DO NOT HANDLE IF PREGNANT Laurey Morale, MD 10/25/2023 Morning Active Spouse/Significant Other, Pharmacy Records, Self  albuterol (VENTOLIN HFA) 108 (90 Base) MCG/ACT inhaler [id number] No Inhale 1 puff into the lungs every 4 (four) hours as needed for shortness of breath. [provider] Past Week Active Spouse/Significant Other, Pharmacy Records, Self  allopurinol (ZYLOPRIM) 100 MG tablet [id number] No Take 100 mg by mouth every morning. [provider] 10/25/2023 Active Spouse/Significant Other, Pharmacy Records, Self  amoxicillin-clavulanate (AUGMENTIN) 875-125 MG tablet [id number]  Take 1 tablet by mouth 2 (two) times daily for 5  days. Lorin Glass, MD  Active   atorvastatin (LIPITOR) 40 MG tablet [id number] No TAKE 1 TABLET BY MOUTH EVERY DAY Jacklynn Ganong, [place] 10/25/2023 Active Spouse/Significant Other, Pharmacy Records, Self  azelastine (ASTELIN) 0.1 % nasal spray [id number] No Place 2 sprays into both nostrils 2 (two) times daily as needed for rhinitis. Use in each nostril as directed [provider] Past Week Active Spouse/Significant Other, Pharmacy Records, Self  Cinnamon 500 MG capsule [id number] No Take 500 mg by mouth every morning. [provider] 10/25/2023 Active Spouse/Significant Other, Pharmacy Records, Self  dapagliflozin propanediol (FARXIGA) 10 MG TABS tablet [id number] No Take 1 tablet (10 mg total) by mouth daily before breakfast. Jacklynn Ganong, [place] 10/25/2023 Active Spouse/Significant Other, Pharmacy Records, Self  ELIQUIS 5 MG TABS tablet [id number] No TAKE 1 TABLET BY MOUTH TWICE A DAY Laurey Morale, MD 10/25/2023  8:30 AM Active Spouse/Significant Other, Pharmacy Records, Self  finasteride (PROSCAR) 5 MG tablet [id number] No Take 5 mg by mouth at bedtime. [provider] 10/24/2023 Active Spouse/Significant Other, Pharmacy Records, Self  gabapentin (NEURONTIN) 300 MG capsule [id number] No Take 300 mg by mouth 3 (three) times daily. [provider] 10/25/2023 Active Spouse/Significant Other, Pharmacy Records, Self  lactobacillus (FLORANEX/LACTINEX) PACK [id number]  Take 1 packet (1 g total) by mouth 3 (three) times daily with meals for 7 days. Lorin Glass, MD  Active   magnesium oxide (MAG-OX) 400 (240 Mg) MG tablet [id number] No TAKE 1 TABLET BY MOUTH EVERY DAY Laurey Morale, MD 10/25/2023 Active Spouse/Significant Other, Pharmacy Records, Self  metoCLOPramide (REGLAN) 5 MG tablet [id number] No Take 5 mg by mouth 3 (three) times daily before meals. [provider] 10/25/2023 Active Spouse/Significant Other, Pharmacy Records, Self  midodrine (PROAMATINE) 5 MG  tablet  [id number] No TAKE 3 TABLETS BY MOUTH 3 TIMES DAILY.  Patient taking differently: Take 15 mg by mouth 3 (three) times daily with meals.   Laurey Morale, MD 10/25/2023 Active Spouse/Significant Other, Pharmacy Records, Self  Multiple Vitamin (MULTIVITAMIN WITH MINERALS) TABS tablet [id number] No Take 1 tablet by mouth daily. Marguerita Merles Milroy, DO 10/25/2023 Active Spouse/Significant Other, Pharmacy Records, Self  OXYGEN [id number] No Inhale 2 L into the lungs continuous. [provider] 10/10/2023 Active Spouse/Significant Other, Pharmacy Records, Self  pantoprazole (PROTONIX) 40 MG tablet [id number] No Take 1 tablet (40 mg total) by mouth every morning. Noemi Chapel, NP 10/25/2023 Active Spouse/Significant Other, Pharmacy Records, Self  potassium chloride (KLOR-CON) 10 MEQ tablet [id number] No Take 10 mEq by mouth 2 (two) times daily. [provider] 10/25/2023 Active Spouse/Significant Other, Pharmacy Records, Self  Tiotropium Bromide-Olodaterol (STIOLTO RESPIMAT) 2.5-2.5 MCG/ACT AERS [id number] No Inhale 2 puffs into the lungs daily.  Patient taking differently: Inhale 1 puff into the lungs daily.   Oretha Milch, MD 10/24/2023 Active Spouse/Significant Other, Pharmacy Records, Self  torsemide (DEMADEX) 20 MG tablet [id number] No Take 20 mg by mouth daily. [provider] 10/25/2023 Active Spouse/Significant Other, Pharmacy Records, Self            Home Care and Equipment/Supplies: Were Home Health Services Ordered?: Yes Name of Home Health Agency:: [facility] Has Agency set up a time to come to your home?: Yes First Home Health Visit Date: 10/30/23 Any new equipment or medical supplies ordered?: No  Functional Questionnaire: Do you need assistance with bathing/showering or dressing?: No Do you need assistance with meal preparation?: Yes (The spouse assists) Do you need assistance with eating?: No Do you have difficulty maintaining continence: No (Performs In and out  Cath) Do you need assistance with getting out of bed/getting out of a chair/moving?: No Do you have difficulty managing or taking your medications?: Yes (His daughter in law fills his pill box)  Follow up appointments reviewed: PCP Follow-up appointment confirmed?: Yes Date of PCP follow-up appointment?: 11/04/23 Follow-up Provider: [name] Specialist [facility] Follow-up appointment confirmed?: Yes Date of Specialist follow-up appointment?: 10/31/23 Follow-Up Specialty Provider:: [facility] Do you need transportation to your follow-up appointment?: No Do you understand care options if your condition(s) worsen?: Yes-patient verbalized understanding  SDOH Interventions Today    Flowsheet Row Most Recent Value  SDOH Interventions   Food Insecurity Interventions Intervention Not Indicated  Housing Interventions Intervention Not Indicated  Transportation Interventions Intervention Not Indicated  Utilities Interventions Intervention Not Indicated      Interventions Today    Flowsheet Row Most Recent Value  Chronic Disease   Chronic disease during today's visit Congestive Heart Failure (CHF), Chronic Obstructive Pulmonary Disease (COPD)  General Interventions   General Interventions Discussed/Reviewed General Interventions Discussed, General Interventions Reviewed, Doctor Visits  Doctor Visits Discussed/Reviewed Doctor Visits Reviewed, Doctor Visits Discussed  Exercise Interventions   Exercise Discussed/Reviewed Physical Activity  Physical Activity Discussed/Reviewed Physical Activity Reviewed, Physical Activity Discussed  Education Interventions   Education Provided Provided Education  Provided Verbal Education On Medication, When to see the doctor, Insurance Plans  Pharmacy Interventions   Pharmacy Dicussed/Reviewed Medications and their functions  Advanced Directive Interventions   Advanced Directives Discussed/Reviewed Advanced Directives Reviewed        Goals  Addressed             This Visit's Progress    TOC Outreach Program       Current  Barriers:  Chronic Disease Management support and education needs related to CHF and COPD   RNCM Clinical Goal(s):  Patient will work with the Care Management team over the next 30 days to address Transition of Care Barriers: Medication access Medication Management Diet/Nutrition/Food Resources Support at home Provider appointments Home Health services verbalize understanding of plan for management of CHF and COPD as evidenced by EMR, no hospital readmission in the next 30 days  through collaboration with RN Care manager, provider, and care team.   Interventions: Evaluation of current treatment plan related to  self management and patient's adherence to plan as established by provider   Heart Failure Interventions:  (Status:  New goal.) Short Term Goal Basic overview and discussion of pathophysiology of Heart Failure reviewed Provided education on low sodium diet Provided education about placing scale on hard, flat surface Advised patient to weigh each morning after emptying bladder Discussed importance of daily weight and advised patient to weigh and record daily Reviewed role of diuretics in prevention of fluid overload and management of heart failure; Discussed the importance of keeping all appointments with provider Provided patient with education about the role of exercise in the management of heart failure  COPD Interventions:  (Status:  New goal.) Short Term Goal Advised patient to track and manage COPD triggers Provided instruction about proper use of medications used for management of COPD including inhalers Advised patient to engage in light exercise as tolerated 3-5 days a week to aid in the the management of COPD Provided education about and advised patient to utilize infection prevention strategies to reduce risk of respiratory infection Discussed the importance of adequate rest and  management of fatigue with COPD Screening for signs and symptoms of depression related to chronic disease state  Wear Oxygen at 2-3l/min continuous Complete antibiotic course  Patient Goals/Self-Care Activities: Participate in Transition of Care Program/Attend [facility] scheduled calls Notify RN Care Manager of TOC call rescheduling needs Take all medications as prescribed Attend all scheduled provider appointments Call pharmacy for medication refills 3-7 days in advance of running out of medications Call provider office for new concerns or questions   Follow Up Plan:  Telephone follow up appointment with care management team member scheduled for:  Wednesday March 19th at 1:00pm        [facility] Outreach completed today. The patient has been in the hospital twice for Pneumonia. He was sent home on 10/20/23 and then returned 10/25/23 with sepsis. He has been discharged home with Home Health. He states he is a little wore out but he is feeling better. He is wearing his oxygen and taking his medication. He is going to increase his physical activity to help increase his stamina. Reviewed  medications. His D-I-L fixes his pill box. He has urinary retention and performs in and out caths at home. He is receptive to the 30 day [facility] Outreach program.  Routine follow-up and on-going assessment evaluation and education of disease processes, and recommended interventions for both chronic and acute medical conditions, will occur during each weekly visit during [facility] 30-day Program Outreach calls along with ongoing review of symptoms, medication reviews and reconciliation. Any updates, inconsistencies, discrepancies or acute care concerns will be addressed on the Care Plan and routed to the correct Practitioner if indicated.    The patient has been provided with contact information for the care management team and has been advised to call with any health-related questions or concerns. The patient verbalized understanding with  current POC. The patient  is directed to their insurance card regarding availability of benefits coverage.  Deidre Ala, BSN, RN [place]  [facility]BC[facility] - [employer] [facility] RN Care Manager [phone]

## 2023-10-30 ENCOUNTER — Telehealth (HOSPITAL_COMMUNITY): Payer: Self-pay

## 2023-10-30 DIAGNOSIS — D696 Thrombocytopenia, unspecified: Secondary | ICD-10-CM | POA: Diagnosis not present

## 2023-10-30 DIAGNOSIS — J440 Chronic obstructive pulmonary disease with acute lower respiratory infection: Secondary | ICD-10-CM | POA: Diagnosis not present

## 2023-10-30 DIAGNOSIS — Z51A Encounter for sepsis aftercare: Secondary | ICD-10-CM | POA: Diagnosis not present

## 2023-10-30 DIAGNOSIS — J439 Emphysema, unspecified: Secondary | ICD-10-CM | POA: Diagnosis not present

## 2023-10-30 DIAGNOSIS — R338 Other retention of urine: Secondary | ICD-10-CM | POA: Diagnosis not present

## 2023-10-30 DIAGNOSIS — I9589 Other hypotension: Secondary | ICD-10-CM | POA: Diagnosis not present

## 2023-10-30 DIAGNOSIS — I2782 Chronic pulmonary embolism: Secondary | ICD-10-CM | POA: Diagnosis not present

## 2023-10-30 DIAGNOSIS — E1122 Type 2 diabetes mellitus with diabetic chronic kidney disease: Secondary | ICD-10-CM | POA: Diagnosis not present

## 2023-10-30 DIAGNOSIS — I272 Pulmonary hypertension, unspecified: Secondary | ICD-10-CM | POA: Diagnosis not present

## 2023-10-30 DIAGNOSIS — G4733 Obstructive sleep apnea (adult) (pediatric): Secondary | ICD-10-CM | POA: Diagnosis not present

## 2023-10-30 DIAGNOSIS — N1832 Chronic kidney disease, stage 3b: Secondary | ICD-10-CM | POA: Diagnosis not present

## 2023-10-30 DIAGNOSIS — K219 Gastro-esophageal reflux disease without esophagitis: Secondary | ICD-10-CM | POA: Diagnosis not present

## 2023-10-30 DIAGNOSIS — K449 Diaphragmatic hernia without obstruction or gangrene: Secondary | ICD-10-CM | POA: Diagnosis not present

## 2023-10-30 DIAGNOSIS — I252 Old myocardial infarction: Secondary | ICD-10-CM | POA: Diagnosis not present

## 2023-10-30 DIAGNOSIS — E1142 Type 2 diabetes mellitus with diabetic polyneuropathy: Secondary | ICD-10-CM | POA: Diagnosis not present

## 2023-10-30 DIAGNOSIS — J9621 Acute and chronic respiratory failure with hypoxia: Secondary | ICD-10-CM | POA: Diagnosis not present

## 2023-10-30 DIAGNOSIS — J189 Pneumonia, unspecified organism: Secondary | ICD-10-CM | POA: Diagnosis not present

## 2023-10-30 DIAGNOSIS — I130 Hypertensive heart and chronic kidney disease with heart failure and stage 1 through stage 4 chronic kidney disease, or unspecified chronic kidney disease: Secondary | ICD-10-CM | POA: Diagnosis not present

## 2023-10-30 DIAGNOSIS — E876 Hypokalemia: Secondary | ICD-10-CM | POA: Diagnosis not present

## 2023-10-30 DIAGNOSIS — M109 Gout, unspecified: Secondary | ICD-10-CM | POA: Diagnosis not present

## 2023-10-30 DIAGNOSIS — N179 Acute kidney failure, unspecified: Secondary | ICD-10-CM | POA: Diagnosis not present

## 2023-10-30 DIAGNOSIS — N401 Enlarged prostate with lower urinary tract symptoms: Secondary | ICD-10-CM | POA: Diagnosis not present

## 2023-10-30 DIAGNOSIS — I5032 Chronic diastolic (congestive) heart failure: Secondary | ICD-10-CM | POA: Diagnosis not present

## 2023-10-30 LAB — CULTURE, BLOOD (ROUTINE X 2)
Culture: NO GROWTH
Culture: NO GROWTH

## 2023-10-30 NOTE — Telephone Encounter (Signed)
 Called and spoke to pt's wife Randa Evens to confirm/remind patient of their appointment at the [facility] on 10/31/23.   Patient reminded to bring all medications and/or complete list.  Confirmed patient has transportation. Gave directions, instructed to utilize valet parking.  Confirmed appointment prior to ending call.

## 2023-10-31 ENCOUNTER — Encounter (HOSPITAL_COMMUNITY): Payer: Self-pay

## 2023-10-31 ENCOUNTER — Telehealth (HOSPITAL_COMMUNITY): Payer: Self-pay

## 2023-10-31 ENCOUNTER — Ambulatory Visit (HOSPITAL_COMMUNITY)
Admission: RE | Admit: 2023-10-31 | Discharge: 2023-10-31 | Disposition: A | Discharge status: Home / Self Care | Payer: Medicare Other | Source: Ambulatory Visit | Attending: Family Medicine | Admitting: Family Medicine

## 2023-10-31 VITALS — BP 116/62 | HR 78 | Ht 75.0 in | Wt 175.2 lb

## 2023-10-31 DIAGNOSIS — K56600 Partial intestinal obstruction, unspecified as to cause: Secondary | ICD-10-CM

## 2023-10-31 DIAGNOSIS — Z79899 Other long term (current) drug therapy: Secondary | ICD-10-CM | POA: Diagnosis not present

## 2023-10-31 DIAGNOSIS — K449 Diaphragmatic hernia without obstruction or gangrene: Secondary | ICD-10-CM | POA: Diagnosis not present

## 2023-10-31 DIAGNOSIS — N183 Chronic kidney disease, stage 3 unspecified: Secondary | ICD-10-CM | POA: Diagnosis not present

## 2023-10-31 DIAGNOSIS — J432 Centrilobular emphysema: Secondary | ICD-10-CM

## 2023-10-31 DIAGNOSIS — J189 Pneumonia, unspecified organism: Secondary | ICD-10-CM | POA: Diagnosis not present

## 2023-10-31 DIAGNOSIS — J9610 Chronic respiratory failure, unspecified whether with hypoxia or hypercapnia: Secondary | ICD-10-CM | POA: Diagnosis not present

## 2023-10-31 DIAGNOSIS — I2510 Atherosclerotic heart disease of native coronary artery without angina pectoris: Secondary | ICD-10-CM | POA: Insufficient documentation

## 2023-10-31 DIAGNOSIS — I130 Hypertensive heart and chronic kidney disease with heart failure and stage 1 through stage 4 chronic kidney disease, or unspecified chronic kidney disease: Secondary | ICD-10-CM | POA: Insufficient documentation

## 2023-10-31 DIAGNOSIS — J9621 Acute and chronic respiratory failure with hypoxia: Secondary | ICD-10-CM | POA: Diagnosis not present

## 2023-10-31 DIAGNOSIS — J9611 Chronic respiratory failure with hypoxia: Secondary | ICD-10-CM | POA: Diagnosis not present

## 2023-10-31 DIAGNOSIS — Z86711 Personal history of pulmonary embolism: Secondary | ICD-10-CM

## 2023-10-31 DIAGNOSIS — I5032 Chronic diastolic (congestive) heart failure: Secondary | ICD-10-CM | POA: Diagnosis not present

## 2023-10-31 DIAGNOSIS — I272 Pulmonary hypertension, unspecified: Secondary | ICD-10-CM | POA: Diagnosis not present

## 2023-10-31 DIAGNOSIS — J440 Chronic obstructive pulmonary disease with acute lower respiratory infection: Secondary | ICD-10-CM | POA: Diagnosis not present

## 2023-10-31 DIAGNOSIS — Z7901 Long term (current) use of anticoagulants: Secondary | ICD-10-CM | POA: Insufficient documentation

## 2023-10-31 DIAGNOSIS — G4733 Obstructive sleep apnea (adult) (pediatric): Secondary | ICD-10-CM | POA: Diagnosis not present

## 2023-10-31 DIAGNOSIS — K2100 Gastro-esophageal reflux disease with esophagitis, without bleeding: Secondary | ICD-10-CM | POA: Diagnosis not present

## 2023-10-31 DIAGNOSIS — J439 Emphysema, unspecified: Secondary | ICD-10-CM | POA: Diagnosis not present

## 2023-10-31 DIAGNOSIS — I9589 Other hypotension: Secondary | ICD-10-CM | POA: Diagnosis not present

## 2023-10-31 DIAGNOSIS — N179 Acute kidney failure, unspecified: Secondary | ICD-10-CM | POA: Diagnosis not present

## 2023-10-31 DIAGNOSIS — Z9981 Dependence on supplemental oxygen: Secondary | ICD-10-CM | POA: Insufficient documentation

## 2023-10-31 LAB — BASIC METABOLIC PANEL
Anion gap: 8 (ref 5–15)
BUN: 18 mg/dL (ref 8–23)
CO2: 37 mmol/L — ABNORMAL HIGH (ref 22–32)
Calcium: 10.2 mg/dL (ref 8.9–10.3)
Chloride: 95 mmol/L — ABNORMAL LOW (ref 98–111)
Creatinine, Ser: 1.79 mg/dL — ABNORMAL HIGH (ref 0.61–1.24)
GFR, Estimated: 37 mL/min — ABNORMAL LOW (ref >60)
Glucose, Bld: 125 mg/dL — ABNORMAL HIGH (ref 70–99)
Potassium: 3.9 mmol/L (ref 3.5–5.1)
Sodium: 140 mmol/L (ref 135–145)

## 2023-10-31 LAB — BRAIN NATRIURETIC PEPTIDE: B Natriuretic Peptide: 240.3 pg/mL — ABNORMAL HIGH (ref 0.0–100.0)

## 2023-10-31 MED ORDER — MIDODRINE HCL 5 MG PO TABS: 15.0000 mg | ORAL_TABLET | Freq: Three times a day (TID) | ORAL | 8 refills | Status: AC

## 2023-10-31 NOTE — Patient Instructions (Signed)
 Thank you for coming in today  If you had labs drawn today, any labs that are abnormal the clinic will call you No news is good news  Medications: No changes   Follow up appointments:  Your physician recommends that you schedule a follow-up appointment in:  4 months in clinic    Do the following things EVERYDAY: Weigh yourself in the morning before breakfast. Write it down and keep it in a log. Take your medicines as prescribed Eat low salt foods--Limit salt (sodium) to 2000 mg per day.  Stay as active as you can everyday Limit all fluids for the day to less than 2 liters   At the [facility], you and your health needs are our priority. As part of our continuing mission to provide you with exceptional heart care, we have created designated Provider Care Teams. These Care Teams include your primary Cardiologist (physician) and Advanced Practice Providers (APPs- Physician Assistants and Nurse Practitioners) who all work together to provide you with the care you need, when you need it.   You may see any of the following providers on your designated Care Team at your next follow up: Dr Arvilla Meres Dr Marca Ancona Dr. Marcos Eke, NP Robbie Lis, [place] [facility] [place], [place] Brynda Peon, NP Karle Plumber, PharmD   Please be sure to bring in all your medications bottles to every appointment.    Thank you for choosing [facility]  If you have any questions or concerns before your next appointment please send [place] a message through [place] or call our office at [phone].    TO LEAVE A MESSAGE FOR THE NURSE SELECT OPTION 2, PLEASE LEAVE A MESSAGE INCLUDING: YOUR NAME DATE OF BIRTH CALL BACK NUMBER REASON FOR CALL**this is important as we prioritize the call backs  YOU WILL RECEIVE A CALL BACK THE SAME DAY AS LONG AS YOU CALL BEFORE 4:00 PM

## 2023-10-31 NOTE — Progress Notes (Signed)
 PCP: [name], DO Cardiology: Dr. Excell Seltzer HF Cardiology: Dr. Shirlee Latch  88 y.o. with history of HFPEF, prior PE, and pulmonary hypertension was referred by Dr. Izora Ribas for evaluation of pulmonary hypertension.  Patient had a PE in 2016.  He is on apixaban. He has a long history of diastolic CHF.  Most recent echo showed a significant component of RV failure with EF 55-60%, IV septum flattened, severe RV enlargement, severely decreased RV function, PASP 57 mmHg.  CTA chest in 4/22 showed no acute PE and mild emphysema, but V/Q scan in 5/22 was suggestive of chronic PE in the right middle lobe.  RHC in 4/22 showed normal filling pressures with moderate PAH. He additionally has chronic hiccups followed by Dr. Elnoria Howard, now on gabapentin. PFTs in 6/22 showed mixed picture with severe obstruction, moderate restriction, and severely decreased DLCO.   Admitted 8/25-8/30/22 with A/C HF exacerbation. He was aggressively diuresed with lasix/metolazone, had unna boots, farxiga added.  Midodrine was increased for hypotension. Hospitalization c/b AKI on CKD III.  Readmitted 9/1-9/12/22 with shock (mixed septic and cardiogenic), likely 2/2 to aspiration pneumonia and A/C CHF.  Suspect possible gut translocation with ileus/partial SBO; CT abdomen with "nutcracker phenomenon".  He was started on NE + antibiotics. General surgery consulted and felt not to be a surgical candidate. Diuretics initially held due to over-diuresis and shock. Eventually able to wean pressors off, restart PO torsemide and midodrine. Hospitalization c/b transaminitis, delirium, and BPH with urinary retention, requiring foley catheter. Palliative care was consulted for GOC and patient DNR/DNI. PT/OT recommended HH. He was discharged home, weight 220 lbs.  EGD 11/22 showed esophagitis, hiatal hernia and yeast.   Zio 11/22 showed 19 second run of VT.   Admitted 2/23 with PNA and a/c CHF. Seen by palliative care and remained full  code.  Admitted 8/23 with septic shock due to aspiration PNA & possible colitis. Echo showed EF 60-65%, RV severely reduced, severely enlarged, RVSP 50, LVEF 60-65%, no RWMA, mild TR, trivial MR. AHF consulted for elevated Hs trop. He underwent R/LHC showing nonobstructive mild CAD, normal/low filling pressures and mild PAH. Troponin elevation likely due to demand ischemia. Hospitalization c/b AKI on CKD. He was restarted on Farxiga and lower dose torsemide (10 mg daily). He was discharged home, weight 175 lbs.   He was admitted 11/23 with a/c respiratory failure, recently discharged (10/23)  after COVID-19 infection. Felt to be septic 2/2 PNA vs UTI. Received IV abx. HsTrop elevated on admission but felt to be demand ischemia. Stable from a HF standpoint and continued on torsemide, midodrine and Farxiga. He was discharged home with HH, weight 188 lbs.  Echo 5/24 showed EF 60-65%, grade I DD, RV function moderately reduced and RV severely enlarged  Admitted in 7/24 with RLL PNA (aspiration likely) and COPD exacerbation.   Admitted 2/25 with CAP. Treated with abx, transiently required NE for BP support. Echo (2/25): EF 60-65%, D shaped septum suggesting cor pulmonale, RV severely reduced. Re-admitted 3/25 with re-current PNA and a/c dHF. Diuresed, continued on midodrine. He was discharged home, weight 180 lbs.  Today he returns for post hospital HF follow up with his daughter. Overall feeling fine. He says breathing is "slow" but does OK walking on flat ground if he takes him time. Denies palpitations, abnormal bleeding, CP, dizziness, edema, or PND/Orthopnea. Chronically sleeps reclined on a wedge.  Appetite ok. No fever or chills. Weight at home 183 pounds. Taking all medications. Wears 2 L oxygen at home. PT starts  today. Lives at home with his wife, daughter lives nearby and helps out.  ECG (personally reviewed): NSR rBBB  Labs (6/22): K 3.8, creatinine 1.72, ANA negative, RF 39.6 but CCP  negative, SCL-70 negative, BNP 1231, HIV negative Labs (2/24): K 3.6, creatinine 1.64, hgb 11.4, BNP 90 Labs (5/24): K 3.7, creatinine 1.66 Labs (7/24): K 4.2, creatinine 1.07 => 1.08 Labs (11/24): LDL 21 Labs (3/25): K 4.2, creatinine 1.25   6 minute walk (7/22): 213 m 6 minute walk (10/22): 274 m (oxygen saturation dropped to 70s on 2L [place])  PMH: 1. HFpEF: With prominent RV failure.   - Echo (2/22): EF 55-60%, IV septum flattened, severe RV enlargement, severely decreased RV function, PASP 57 mmHg.  - Echo (8/23): EF 60-65%, RV severely reduced, severely enlarged, RVSP 50, LVEF 60-65%, no RWMA, mild TR, trivial MR.  - Echo (5/24): EF 60-65%, grade I DD, RV function moderately reduced and RV severely enlarged - Echo (2/25): EF 60-65%, D shaped septum suggesting cor pulmonale, RV severely reduced 2. Venous thromboembolic disease: PE in 2016.   - Venous dopplers (4/22): No DVT.  - CTA chest (4/22): No PE.  - V/Q scan 5/22 with perfusion defect in the RML consistent with chronic PE.  3. OSA: Did not tolerate CPAP.   4. HTN 5. COPD: Prior smoker.   - CTA chest in 4/22 showed no PE, mild emphysema.  - PFTs (6/22) with severe obstruction, moderate restriction, severely decreased DLCO 6. Pulmonary hypertension: RHC (4/22) with mean RA 5, PA 65/19 mean 36, mean PCWP 5, CI 2.19, PVR 6.1 WU, PAPi 9.2.  - RHC (8/23) with mean RA 1, PA 46/10 mean 25, mean PCWP 4, CI 2.98, PVR 3.4 WU 7. Chronic hiccups 8. BPH: Has to in and out cath at times. 9. Syncope: Zio 12/22 with 19 second VT run.  10. Chronic hiccups 11. 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 12. CAD: LHC (8/23) with nonobstructive CAD.   Social History   Socioeconomic History   Marital status: Married    Spouse name: Joann   Number of children: 2   Years of education: 14   Highest education level: Not on file  Occupational History   Occupation: postal  service,[employer] there,school crossing guard. Stopped working in  2022  Tobacco Use   Smoking status: Former    Current packs/day: 0.00    Average packs/day: 2.0 packs/day for 52.0 years (104.0 ttl pk-yrs)    Types: Cigarettes    Start date: 66    Quit date: 08/19/2006    Years since quitting: 17.2   Smokeless tobacco: Never   Tobacco comments:    Former smoke 03/15/22  Vaping Use   Vaping status: Never Used  Substance and Sexual Activity   Alcohol use: Not Currently    Alcohol/week: 6.0 standard drinks of alcohol    Types: 6 Shots of liquor per week    Comment: stopped in 2018   Drug use: No   Sexual activity: Not Currently  Other Topics Concern   Not on file  Social History Narrative   Not on file   Social Drivers of Health   Financial Resource Strain: Not on file  Food Insecurity: No Food Insecurity (10/29/2023)   Hunger Vital Sign    Worried About Running Out of Food in the Last Year: Never true    Ran Out of Food in the Last Year: Never  true  Transportation Needs: No Transportation Needs (10/29/2023)   PRAPARE - Administrator, Civil Service (Medical): No    Lack of Transportation (Non-Medical): No  Recent Concern: Transportation Needs - Unmet Transportation Needs (10/10/2023)   PRAPARE - Transportation    Lack of Transportation (Medical): No    Lack of Transportation (Non-Medical): Yes  Physical Activity: Not on file  Stress: No Stress Concern Present (05/09/2021)   [employer] of Occupational Health - Occupational Stress Questionnaire    Feeling of Stress : Only a little  Social Connections: Socially Integrated (10/10/2023)   Social Connection and Isolation Panel [NHANES]    Frequency of Communication with Friends and Family: Three times a week    Frequency of Social Gatherings with Friends and Family: Three times a week    Attends Religious Services: 1 to 4 times per year    Active Member of Clubs or Organizations: No     Attends Banker Meetings: 1 to 4 times per year    Marital Status: Married  Catering manager Violence: Not At Risk (10/29/2023)   Humiliation, Afraid, Rape, and Kick questionnaire    Fear of Current or Ex-Partner: No    Emotionally Abused: No    Physically Abused: No    Sexually Abused: No   Family History  Problem Relation Age of Onset   Heart attack Brother 57   ROS: All systems reviewed and negative except as per HPI.   Current Outpatient Medications  Medication Sig Dispense Refill   ADEMPAS 2 MG TABS TAKE 1 TABLET BY MOUTH 3 TIMES A DAY AS DIRECTED. DO NOT HANDLE IF PREGNANT 90 tablet 11   albuterol (VENTOLIN HFA) 108 (90 Base) MCG/ACT inhaler Inhale 1 puff into the lungs every 4 (four) hours as needed for shortness of breath.     allopurinol (ZYLOPRIM) 100 MG tablet Take 100 mg by mouth every morning.     amoxicillin-clavulanate (AUGMENTIN) 875-125 MG tablet Take 1 tablet by mouth 2 (two) times daily for 5 days. 10 tablet 0   atorvastatin (LIPITOR) 40 MG tablet TAKE 1 TABLET BY MOUTH EVERY DAY 90 tablet 2   azelastine (ASTELIN) 0.1 % nasal spray Place 2 sprays into both nostrils 2 (two) times daily as needed for rhinitis. Use in each nostril as directed     Cinnamon 500 MG capsule Take 500 mg by mouth every morning.     dapagliflozin propanediol (FARXIGA) 10 MG TABS tablet Take 1 tablet (10 mg total) by mouth daily before breakfast. 90 tablet 3   ELIQUIS 5 MG TABS tablet TAKE 1 TABLET BY MOUTH TWICE A DAY 60 tablet 3   finasteride (PROSCAR) 5 MG tablet Take 5 mg by mouth at bedtime.     gabapentin (NEURONTIN) 300 MG capsule Take 300 mg by mouth 3 (three) times daily.     lactobacillus (FLORANEX/LACTINEX) PACK Take 1 packet (1 g total) by mouth 3 (three) times daily with meals for 7 days. 21 packet 0   magnesium oxide (MAG-OX) 400 (240 Mg) MG tablet TAKE 1 TABLET BY MOUTH EVERY DAY 90 tablet 1   metoCLOPramide (REGLAN) 5 MG tablet Take 5 mg by mouth 3 (three) times daily  before meals.     midodrine (PROAMATINE) 5 MG tablet TAKE 3 TABLETS BY MOUTH 3 TIMES DAILY. (Patient taking differently: Take 15 mg by mouth 3 (three) times daily with meals.) 810 tablet 0   Multiple Vitamin (MULTIVITAMIN WITH MINERALS) TABS tablet Take 1 tablet by  mouth daily. 30 tablet 0   OXYGEN Inhale 2 L into the lungs continuous.     pantoprazole (PROTONIX) 40 MG tablet Take 1 tablet (40 mg total) by mouth every morning. 30 tablet 5   potassium chloride (KLOR-CON) 10 MEQ tablet Take 10 mEq by mouth 2 (two) times daily.     Tiotropium Bromide-Olodaterol (STIOLTO RESPIMAT) 2.5-2.5 MCG/ACT AERS Inhale 2 puffs into the lungs daily. (Patient taking differently: Inhale 1 puff into the lungs daily.) 12 g 1   torsemide (DEMADEX) 20 MG tablet Take 20 mg by mouth daily.     No current facility-administered medications for this encounter.   Wt Readings from Last 3 Encounters:  10/31/23 79.5 kg (175 lb 3.2 oz)  10/27/23 81.9 kg (180 lb 8.9 oz)  10/20/23 83.2 kg (183 lb 6.4 oz)   BP 116/62   Pulse 78   Ht 6\' 3"  (1.905 m)   Wt 79.5 kg (175 lb 3.2 oz)   SpO2 93%   BMI 21.90 kg/m  Physical Exam General:  NAD. No resp difficulty, walked into clinic, thin on oxygen HEENT: Normal Neck: Supple. No JVD. Cor: Regular rate & rhythm. No rubs, gallops or murmurs. Lungs: Clear, diminished in bases Abdomen: Soft, nontender, nondistended.  Extremities: No cyanosis, clubbing, rash, edema Neuro: Alert & oriented x 3, moves all 4 extremities w/o difficulty. Affect pleasant.  Assessment/Plan: 1. Chronic HFpEF/RV failure: Echo (2/22) with EF 55-60%, IV septum flattened, severe RV enlargement, severely decreased RV function, PASP 57 mmHg.  He has severe RV failure at baseline.  Echo this admit 8/23 >>RV severely reduced, severely enlarged, RVSP 50, LVEF 60-65%, no RWMA, mild TR, trivial MR.  RHC 8/23 showed normal/low filling pressures and mild PAH.  Echo 5/24 showed EF 60-65%, RV moderately reduced. Echo  (2/25): EF of 55% and severely enlarged RV w/ severely reduced function. NYHA class II-early III, as long as he is wearing his oxygen. He is not volume overloaded on exam. Midodrine seems to be controlling his orthostasis.  - Continue Farxiga 10 mg daily. BMET/BNP today. - Continue torsemide 20 mg daily + 20 KCL daily. - Continue midodrine 15 mg tid. Would avoid fludrocortisone. - Continue compression hose. 2. Pulmonary HTN: PAH noted on 4/22 RHC with PVR 6.1 WU.  This appears to be multifactorial with OSA, severe emphysema, and a suspected chronic PE involving the right middle lobe (group 3 and group 4 PH). Given the suspected mixed etiology with only 1 area of chronic thromboembolism (right middle lobe) as well as age, do not think that pulmonary thromboendarterectomy would be indicated.  Rheumatologic serologic workup was negative.  PFTs showed severe obstruction and moderate restriction, suggesting significant COPD. RHC 8/23 showed mild pulmonary hypertension with PVR 3.4 WU.  - Continue riociguat.  - Holding off Tyvaso for now, suspect large group 3 PH component and only mildly elevated PA pressure on 8/23 RHC.  3. Chronic respiratory failure: Admission 10/23 for COVID. 7/24 and 2/25 admission for PNA.  - Stable on 2L [place]. 4. CKD III: Continue SGLT2i. BMET today. 5. CAD: nonobstructive on [employer] [facility] 8/23. No exertional chest pain.  - No ASA with need for apixaban.  - Continue statin. Good lipids 11/24. 6. OSA: Moderate OSA on sleep study. He did not tolerate CPAP.  - Continue oxygen 7. Emphysema: Prior smoker.  Emphysema on CT and severe obstruction on PFTs. On 2L home oxygen. 8. Chronic PE: Diagnosed by V/Q scan.  No abnormal bleeding. He has a probable chronic RLE  DVT.   - Continue Eliquis. No bleeding issues. CBC & iron panel today.  9. Hiatial hernia w/ esophagitis: Has significant GERD. He follows with Dr. Elnoria Howard. - Continue PPI.  10. Nutcracker SMA: Compresses duodenum - No change  Follow up  in 4 months with APP.    Anderson Malta [facility] FNP-BC 10/31/23

## 2023-10-31 NOTE — Telephone Encounter (Signed)
 Spoke to patient about lab results. Appointment scheduled  for 1 week as ordered

## 2023-10-31 NOTE — Telephone Encounter (Signed)
-----   Message from Jacklynn Ganong sent at 10/31/2023 11:23 AM EDT ----- Renal function up a bit from baseline.  Please repeat BMET in 7-10 days to follow

## 2023-11-02 ENCOUNTER — Encounter (HOSPITAL_COMMUNITY): Payer: Self-pay | Admitting: Emergency Medicine

## 2023-11-02 ENCOUNTER — Emergency Department (HOSPITAL_COMMUNITY)

## 2023-11-02 ENCOUNTER — Emergency Department (HOSPITAL_COMMUNITY)
Admission: EM | Admit: 2023-11-02 | Discharge: 2023-11-02 | Disposition: A | Discharge status: Home / Self Care | Attending: Emergency Medicine | Admitting: Emergency Medicine

## 2023-11-02 ENCOUNTER — Other Ambulatory Visit: Payer: Self-pay

## 2023-11-02 DIAGNOSIS — Z7901 Long term (current) use of anticoagulants: Secondary | ICD-10-CM | POA: Insufficient documentation

## 2023-11-02 DIAGNOSIS — R42 Dizziness and giddiness: Secondary | ICD-10-CM | POA: Diagnosis not present

## 2023-11-02 DIAGNOSIS — N189 Chronic kidney disease, unspecified: Secondary | ICD-10-CM | POA: Insufficient documentation

## 2023-11-02 DIAGNOSIS — D72829 Elevated white blood cell count, unspecified: Secondary | ICD-10-CM | POA: Insufficient documentation

## 2023-11-02 DIAGNOSIS — J189 Pneumonia, unspecified organism: Secondary | ICD-10-CM | POA: Diagnosis not present

## 2023-11-02 DIAGNOSIS — I959 Hypotension, unspecified: Secondary | ICD-10-CM | POA: Diagnosis not present

## 2023-11-02 DIAGNOSIS — I951 Orthostatic hypotension: Secondary | ICD-10-CM

## 2023-11-02 DIAGNOSIS — J984 Other disorders of lung: Secondary | ICD-10-CM | POA: Diagnosis not present

## 2023-11-02 DIAGNOSIS — R600 Localized edema: Secondary | ICD-10-CM | POA: Insufficient documentation

## 2023-11-02 DIAGNOSIS — R0689 Other abnormalities of breathing: Secondary | ICD-10-CM | POA: Diagnosis not present

## 2023-11-02 DIAGNOSIS — J449 Chronic obstructive pulmonary disease, unspecified: Secondary | ICD-10-CM | POA: Insufficient documentation

## 2023-11-02 DIAGNOSIS — I509 Heart failure, unspecified: Secondary | ICD-10-CM | POA: Diagnosis not present

## 2023-11-02 DIAGNOSIS — R9431 Abnormal electrocardiogram [ECG] [EKG]: Secondary | ICD-10-CM | POA: Diagnosis not present

## 2023-11-02 DIAGNOSIS — R0602 Shortness of breath: Secondary | ICD-10-CM | POA: Diagnosis not present

## 2023-11-02 DIAGNOSIS — R55 Syncope and collapse: Secondary | ICD-10-CM | POA: Insufficient documentation

## 2023-11-02 LAB — CBC WITH DIFFERENTIAL/PLATELET
Abs Immature Granulocytes: 0.05 10*3/uL (ref 0.00–0.07)
Basophils Absolute: 0 10*3/uL (ref 0.0–0.1)
Basophils Relative: 0 %
Eosinophils Absolute: 0.1 10*3/uL (ref 0.0–0.5)
Eosinophils Relative: 1 %
HCT: 34 % — ABNORMAL LOW (ref 39.0–52.0)
Hemoglobin: 10.8 g/dL — ABNORMAL LOW (ref 13.0–17.0)
Immature Granulocytes: 0 %
Lymphocytes Relative: 5 %
Lymphs Abs: 0.7 10*3/uL (ref 0.7–4.0)
MCH: 27.6 pg (ref 26.0–34.0)
MCHC: 31.8 g/dL (ref 30.0–36.0)
MCV: 87 fL (ref 80.0–100.0)
Monocytes Absolute: 0.7 10*3/uL (ref 0.1–1.0)
Monocytes Relative: 5 %
Neutro Abs: 10.8 10*3/uL — ABNORMAL HIGH (ref 1.7–7.7)
Neutrophils Relative %: 89 %
Platelets: 177 10*3/uL (ref 150–400)
RBC: 3.91 MIL/uL — ABNORMAL LOW (ref 4.22–5.81)
RDW: 17 % — ABNORMAL HIGH (ref 11.5–15.5)
WBC: 12.3 10*3/uL — ABNORMAL HIGH (ref 4.0–10.5)
nRBC: 0 % (ref 0.0–0.2)

## 2023-11-02 LAB — COMPREHENSIVE METABOLIC PANEL
ALT: 12 U/L (ref 0–44)
AST: 20 U/L (ref 15–41)
Albumin: 3 g/dL — ABNORMAL LOW (ref 3.5–5.0)
Alkaline Phosphatase: 58 U/L (ref 38–126)
Anion gap: 9 (ref 5–15)
BUN: 20 mg/dL (ref 8–23)
CO2: 35 mmol/L — ABNORMAL HIGH (ref 22–32)
Calcium: 9.7 mg/dL (ref 8.9–10.3)
Chloride: 92 mmol/L — ABNORMAL LOW (ref 98–111)
Creatinine, Ser: 1.75 mg/dL — ABNORMAL HIGH (ref 0.61–1.24)
GFR, Estimated: 38 mL/min — ABNORMAL LOW (ref >60)
Glucose, Bld: 105 mg/dL — ABNORMAL HIGH (ref 70–99)
Potassium: 4 mmol/L (ref 3.5–5.1)
Sodium: 136 mmol/L (ref 135–145)
Total Bilirubin: 0.5 mg/dL (ref 0.0–1.2)
Total Protein: 6.3 g/dL — ABNORMAL LOW (ref 6.5–8.1)

## 2023-11-02 LAB — D-DIMER, QUANTITATIVE: D-Dimer, Quant: 0.85 ug{FEU}/mL — ABNORMAL HIGH (ref 0.00–0.50)

## 2023-11-02 LAB — TROPONIN I (HIGH SENSITIVITY)
Troponin I (High Sensitivity): 34 ng/L — ABNORMAL HIGH (ref <18)
Troponin I (High Sensitivity): 37 ng/L — ABNORMAL HIGH (ref <18)

## 2023-11-02 MED ORDER — MIDODRINE HCL 5 MG PO TABS
15.0000 mg | ORAL_TABLET | Freq: Once | ORAL | Status: AC
  Administered 2023-11-02: 15 mg via ORAL
  Filled 2023-11-02: qty 3

## 2023-11-02 MED ORDER — LACTATED RINGERS IV BOLUS: 1000.0000 mL | Freq: Once | INTRAVENOUS | Status: DC

## 2023-11-02 NOTE — ED Notes (Signed)
Sandwich bag given. 

## 2023-11-02 NOTE — Discharge Instructions (Signed)
 All the blood test look okay today.  Kidney function is still about the same as it was slightly better than the last time it was tested.  X-ray looks like the pneumonia is improving.  No signs of heart attack today.  You were given your evening dose of midodrine here in the emergency room.  Take half of your torsemide (10 mg) for the next 3 days and then you can go back to the normal dose.  Make sure you are getting up slowly and if you feel dizzy sit down to rest.  If you start having chest pain, shortness of breath, confusion or repetitive vomiting return to the emergency room.

## 2023-11-02 NOTE — ED Triage Notes (Signed)
 Pt BIB [facility] from home due to dizziness with near syncope.  He was coming out of home to steps and family prevented him from falling.  Pt was just released Tuesday 10/28/2023 from hospital due to pneumonia.  Pt just finished his antibiotics today 10/23/2023.  Pt normally on 3L nasal cannula baseline.  Hx COPD.  18g left AC.   VS BP 96/60, HR 100, SpO2 84% 3L nasal cannula was given 6L simple mask SpO2 96%

## 2023-11-02 NOTE — ED Provider Notes (Signed)
 [facility] EMERGENCY DEPARTMENT AT [facility] Provider Note   CSN: [id number] Arrival date & time: 11/02/23  1637     History  No chief complaint on file.   Joshua [name] is a 88 y.o. male.  Pt is an 88 y.o. male with PMH significant for COPD on 2 L O2 at home, pulmonary hypertension, CHF, chronic PE on Eliquis, CKD, chronic hypotension on midodrine, HLD, OSA, hiatal hernia, BPH, chronic urinary retention, does self cath 3 times daily as needed for years who was recently admitted for sepsis and pneumonia and discharged home on Tuesday who finished his last dose of antibiotic today but reports that he was brought here after having a near syncopal event at home.  Patient reports when he would get up and try to walk he would start feeling lightheaded and dizzy.  He went up 6 steps and then felt like he needed to sit down on his landing.  He reports the next thing he knew his wife was calling 911.  He does report feeling dizzy and short of breath during this episode.  Since being home from the hospital he has been wearing oxygen all the time.  Initially when paramedics arrived they reported patient's blood pressure was 96/60 with a heart rate of 100 oxygen saturation of 84% on 3 L nasal cannula.  Patient was given on nonrebreather with improvement of his shortness of breath and a 500 mL bolus.  Patient denies any chest pain, new lower extremity swelling, leg pain or numbness.  He denies any abdominal pain, nausea vomiting or diarrhea.  He has been compliant with his medications.  Feels like he has been eating and drinking okay since being home from the hospital.  The history is provided by the patient, medical records and the EMS personnel.       Home Medications Prior to Admission medications   Medication Sig Start Date End Date Taking? Authorizing Provider  ADEMPAS 2 MG TABS TAKE 1 TABLET BY MOUTH 3 TIMES A DAY AS DIRECTED. DO NOT HANDLE IF PREGNANT 08/26/23   Laurey Morale, MD   albuterol (VENTOLIN HFA) 108 (90 Base) MCG/ACT inhaler Inhale 1 puff into the lungs every 4 (four) hours as needed for shortness of breath.    [provider]  allopurinol (ZYLOPRIM) 100 MG tablet Take 100 mg by mouth every morning.    [provider]  amoxicillin-clavulanate (AUGMENTIN) 875-125 MG tablet Take 1 tablet by mouth 2 (two) times daily for 5 days. 10/28/23 11/02/23  Lorin Glass, MD  atorvastatin (LIPITOR) 40 MG tablet TAKE 1 TABLET BY MOUTH EVERY DAY 06/30/23   Milford, Anderson Malta, FNP  azelastine (ASTELIN) 0.1 % nasal spray Place 2 sprays into both nostrils 2 (two) times daily as needed for rhinitis. Use in each nostril as directed    [provider]  Cinnamon 500 MG capsule Take 500 mg by mouth every morning.    [provider]  dapagliflozin propanediol (FARXIGA) 10 MG TABS tablet Take 1 tablet (10 mg total) by mouth daily before breakfast. 02/25/23   Milford, Anderson Malta, FNP  ELIQUIS 5 MG TABS tablet TAKE 1 TABLET BY MOUTH TWICE A DAY 07/21/23   Laurey Morale, MD  finasteride (PROSCAR) 5 MG tablet Take 5 mg by mouth at bedtime.    [provider]  gabapentin (NEURONTIN) 300 MG capsule Take 300 mg by mouth 3 (three) times daily. 04/04/22   [provider]  lactobacillus (FLORANEX/LACTINEX) PACK Take  1 packet (1 g total) by mouth 3 (three) times daily with meals for 7 days. 10/28/23 11/04/23  Lorin Glass, MD  magnesium oxide (MAG-OX) 400 (240 Mg) MG tablet TAKE 1 TABLET BY MOUTH EVERY DAY 09/29/23   Laurey Morale, MD  metoCLOPramide (REGLAN) 5 MG tablet Take 5 mg by mouth 3 (three) times daily before meals.    [provider]  midodrine (PROAMATINE) 5 MG tablet Take 3 tablets (15 mg total) by mouth 3 (three) times daily with meals. 10/31/23   Jacklynn Ganong, FNP  Multiple Vitamin (MULTIVITAMIN WITH MINERALS) TABS tablet Take 1 tablet by mouth daily. 05/01/21   Marguerita Merles Latif, DO  OXYGEN Inhale 2 L into the lungs  continuous.    [provider]  pantoprazole (PROTONIX) 40 MG tablet Take 1 tablet (40 mg total) by mouth every morning. 03/07/23   Cobb, Ruby Cola, NP  potassium chloride (KLOR-CON) 10 MEQ tablet Take 10 mEq by mouth 2 (two) times daily.    [provider]  Tiotropium Bromide-Olodaterol (STIOLTO RESPIMAT) 2.5-2.5 MCG/ACT AERS Inhale 2 puffs into the lungs daily. Patient taking differently: Inhale 1 puff into the lungs daily. 08/04/23   Oretha Milch, MD  torsemide (DEMADEX) 20 MG tablet Take 20 mg by mouth daily.    [provider]      Allergies    Other, Sunflower oil, and Sulfa antibiotics    Review of Systems   Review of Systems  Physical Exam Updated Vital Signs BP (!) 104/55   Pulse 64   Temp 98 F (36.7 C) (Oral)   Resp 17   Ht 6\' 3"  (1.905 m)   Wt 80.7 kg   SpO2 97%   BMI 22.25 kg/m  Physical Exam Vitals and nursing note reviewed.  Constitutional:      General: He is not in acute distress.    Appearance: He is well-developed.  HENT:     Head: Normocephalic and atraumatic.  Eyes:     Conjunctiva/sclera: Conjunctivae normal.     Pupils: Pupils are equal, round, and reactive to light.  Cardiovascular:     Rate and Rhythm: Normal rate and regular rhythm.     Heart sounds: No murmur heard. Pulmonary:     Effort: Pulmonary effort is normal. Tachypnea present. No respiratory distress.     Breath sounds: Normal breath sounds. No wheezing or rales.  Abdominal:     General: There is no distension.     Palpations: Abdomen is soft.     Tenderness: There is no abdominal tenderness. There is no guarding or rebound.  Musculoskeletal:        General: No tenderness. Normal range of motion.     Cervical back: Normal range of motion and neck supple.     Comments: Trace edema in bilateral ankles.  No calf or thigh pain.  Skin:    General: Skin is warm and dry.     Findings: No erythema or rash.  Neurological:     Mental Status: He is alert and  oriented to person, place, and time. Mental status is at baseline.  Psychiatric:        Behavior: Behavior normal.     ED Results / Procedures / Treatments   Labs (all labs ordered are listed, but only abnormal results are displayed) Labs Reviewed  CBC WITH DIFFERENTIAL/PLATELET - Abnormal; Notable for the following components:      Result Value   WBC 12.3 (*)    RBC  3.91 (*)    Hemoglobin 10.8 (*)    HCT 34.0 (*)    RDW 17.0 (*)    Neutro Abs 10.8 (*)    All other components within normal limits  COMPREHENSIVE METABOLIC PANEL - Abnormal; Notable for the following components:   Chloride 92 (*)    CO2 35 (*)    Glucose, Bld 105 (*)    Creatinine, Ser 1.75 (*)    Total Protein 6.3 (*)    Albumin 3.0 (*)    GFR, Estimated 38 (*)    All other components within normal limits  D-DIMER, QUANTITATIVE - Abnormal; Notable for the following components:   D-Dimer, Quant 0.85 (*)    All other components within normal limits  TROPONIN I (HIGH SENSITIVITY) - Abnormal; Notable for the following components:   Troponin I (High Sensitivity) 34 (*)    All other components within normal limits  TROPONIN I (HIGH SENSITIVITY) - Abnormal; Notable for the following components:   Troponin I (High Sensitivity) 37 (*)    All other components within normal limits    EKG None  Radiology DG Chest Port 1 View Result Date: 11/02/2023 CLINICAL DATA:  Near syncope, short of breath EXAM: PORTABLE CHEST 1 VIEW COMPARISON:  10/25/2023 FINDINGS: 2 frontal views of the chest demonstrate an unremarkable cardiac silhouette. Moderate improvement in the left basilar consolidation seen previously, most pronounced in the retrocardiac region. No effusion or pneumothorax. No acute bony abnormalities. IMPRESSION: 1. Persistent but improving left basilar airspace disease, which may reflect infection or aspiration. Electronically Signed   By: Sharlet Salina M.D.   On: 11/02/2023 17:50    Procedures Procedures     Medications Ordered in ED Medications  midodrine (PROAMATINE) tablet 15 mg (has no administration in time range)    ED Course/ Medical Decision Making/ A&P                                 Medical Decision Making Amount and/or Complexity of Data Reviewed Independent Historian: spouse and EMS External Data Reviewed: notes. Labs: ordered. Decision-making details documented in ED Course. Radiology: ordered and independent interpretation performed. Decision-making details documented in ED Course. ECG/medicine tests: ordered and independent interpretation performed. Decision-making details documented in ED Course.  Risk Prescription drug management.   Pt with multiple medical problems and comorbidities and presenting today with a complaint that caries a high risk for morbidity and mortality.  Here today with a near syncopal event and shortness of breath.  He reports symptoms are only present with standing and exertion.  He reports symptoms are resolved now.  Patient does not have a history of hypotension on midodrine however he was also recently ill and been treated for pneumonia.  Patient currently blood pressure is between upper 90s and 100 which based on his last hospital visit is consistent with his baseline.  Now oxygen saturation is 100% on 6 L.  Will ensure no new acute organ failure, lower suspicion for PE as patient does take Eliquis.  Low suspicion for DVT as patient does not have any pain or new swelling noted in his legs.  I independently interpreted patient's EKG which shows a right bundle branch block but no significant changes.  9:37 PM I independently interpreted patient's labs and CBC today with a leukocytosis of 12 and hemoglobin of 10.8.  Leukocytosis is improving and hemoglobin has been stable, troponin is stable at 34 and 37, CMP  with a creatinine 1.7 5 from from 1.79 on last check but still slightly higher than what his baseline is, age adjusted D-dimer is within normal  limits and patient is on Eliquis and low suspicion that this is PE at this time. I have independently visualized and interpreted pt's images today.  Chest x-ray with airspace disease but appears to be improving.  On repeat evaluation patient's wife is in the room and she gives very similar history.  Patient does have issues with feeling worn out and dizzy when he stands up and walks but has not had any episodes while sitting.  He has been compliant with his midodrine.  Here patient's blood pressure has been similar to what it was while he was in the hospital.  He was able to stand and walk here without any difficulties or feeling lightheaded.  Discussed all the results with the patient.  At this time appears stable for discharge home.  He and his wife are comfortable with this plan.  Will have him continue to plan follow-up this week.  Patient is on his home 3 L of oxygen and he has been 100% the whole time he has been here.  Patient does take 20 mg of torsemide every day.  Will have him take half a tablet for the next 3 days.  He was given return precautions.          Final Clinical Impression(s) / ED Diagnoses Final diagnoses:  Near syncope  Postural hypotension    Rx / DC Orders ED Discharge Orders     None         Gwyneth Sprout, MD 11/02/23 2137

## 2023-11-04 DIAGNOSIS — I2510 Atherosclerotic heart disease of native coronary artery without angina pectoris: Secondary | ICD-10-CM | POA: Diagnosis not present

## 2023-11-04 DIAGNOSIS — I952 Hypotension due to drugs: Secondary | ICD-10-CM | POA: Diagnosis not present

## 2023-11-04 DIAGNOSIS — J189 Pneumonia, unspecified organism: Secondary | ICD-10-CM | POA: Diagnosis not present

## 2023-11-04 DIAGNOSIS — J449 Chronic obstructive pulmonary disease, unspecified: Secondary | ICD-10-CM | POA: Diagnosis not present

## 2023-11-04 DIAGNOSIS — I5032 Chronic diastolic (congestive) heart failure: Secondary | ICD-10-CM | POA: Diagnosis not present

## 2023-11-04 DIAGNOSIS — I272 Pulmonary hypertension, unspecified: Secondary | ICD-10-CM | POA: Diagnosis not present

## 2023-11-04 DIAGNOSIS — Z09 Encounter for follow-up examination after completed treatment for conditions other than malignant neoplasm: Secondary | ICD-10-CM | POA: Diagnosis not present

## 2023-11-04 DIAGNOSIS — Z9981 Dependence on supplemental oxygen: Secondary | ICD-10-CM | POA: Diagnosis not present

## 2023-11-05 ENCOUNTER — Other Ambulatory Visit: Payer: Self-pay

## 2023-11-05 DIAGNOSIS — J439 Emphysema, unspecified: Secondary | ICD-10-CM | POA: Diagnosis not present

## 2023-11-05 DIAGNOSIS — I9589 Other hypotension: Secondary | ICD-10-CM | POA: Diagnosis not present

## 2023-11-05 DIAGNOSIS — J189 Pneumonia, unspecified organism: Secondary | ICD-10-CM | POA: Diagnosis not present

## 2023-11-05 DIAGNOSIS — N179 Acute kidney failure, unspecified: Secondary | ICD-10-CM | POA: Diagnosis not present

## 2023-11-05 DIAGNOSIS — J9621 Acute and chronic respiratory failure with hypoxia: Secondary | ICD-10-CM | POA: Diagnosis not present

## 2023-11-05 DIAGNOSIS — J440 Chronic obstructive pulmonary disease with acute lower respiratory infection: Secondary | ICD-10-CM | POA: Diagnosis not present

## 2023-11-05 NOTE — Patient Instructions (Signed)
 Visit Information  Thank you for taking time to visit with me today. Please don't hesitate to contact me if I can be of assistance to you before our next scheduled telephone appointment.  Our next appointment is by telephone on Wednesday at 3:30pm  Following is a copy of your care plan:   Goals Addressed             This Visit's Progress    TOC Outreach Program   On track    Current Barriers: (reviewed 11/05/23) Chronic Disease Management support and education needs related to CHF and COPD   RNCM Clinical Goal(s): (reviewed 11/05/23) Patient will work with the Care Management team over the next 30 days to address Transition of Care Barriers: Medication access Medication Management Diet/Nutrition/Food Resources Support at home Provider appointments Home Health services verbalize understanding of plan for management of CHF and COPD as evidenced by EMR, no hospital readmission in the next 30 days  through collaboration with RN Care manager, provider, and care team.   Interventions:(reviewed 11/05/23) Evaluation of current treatment plan related to  self management and patient's adherence to plan as established by provider   Heart Failure Interventions:  (Status:  New goal.) Short Term Goal (reviewed 11/05/23) Basic overview and discussion of pathophysiology of Heart Failure reviewed Provided education on low sodium diet Provided education about placing scale on hard, flat surface Advised patient to weigh each morning after emptying bladder Discussed importance of daily weight and advised patient to weigh and record daily Reviewed role of diuretics in prevention of fluid overload and management of heart failure; Discussed the importance of keeping all appointments with provider Provided patient with education about the role of exercise in the management of heart failure  COPD Interventions:  (Status:  New goal.) Short Term Goal (reviewed 11/05/23) Advised patient to track and manage  COPD triggers Provided instruction about proper use of medications used for management of COPD including inhalers Advised patient to engage in light exercise as tolerated 3-5 days a week to aid in the the management of COPD Provided education about and advised patient to utilize infection prevention strategies to reduce risk of respiratory infection Discussed the importance of adequate rest and management of fatigue with COPD Screening for signs and symptoms of depression related to chronic disease state  Wear Oxygen at 2-3l/min continuous  Patient Goals/Self-Care Activities: (reviewed 11/05/23) Participate in Transition of Care Program/Attend [facility] scheduled calls Notify RN Care Manager of [facility] call rescheduling needs Take all medications as prescribed Attend all scheduled provider appointments Call pharmacy for medication refills 3-7 days in advance of running out of medications Call provider office for new concerns or questions   Follow Up Plan:  Telephone follow up appointment with care management team member scheduled for:  Wednesday March 26th at 3:30pm         Patient verbalizes understanding of instructions and care plan provided today and agrees to view in MyChart. Active MyChart status and patient understanding of how to access instructions and care plan via MyChart confirmed with patient.     The patient has been provided with contact information for the care management team and has been advised to call with any health related questions or concerns.   Please call the care guide team at [phone] if you need to cancel or reschedule your appointment.   Please call the Suicide and Crisis Lifeline: 988 call the [place] National Suicide Prevention Lifeline: 346-660-6164 or TTY: 859-505-9482 TTY (419) 550-0786) to talk to a trained  counselor if you are experiencing a Mental Health or Behavioral Health Crisis or need someone to talk to.  Deidre Ala, BSN, RN [place]  VBCI -  [employer] [facility] RN Care Manager [phone]

## 2023-11-05 NOTE — Patient Outreach (Signed)
 Care Management  Transitions of Care Program Transitions of Care Post-discharge week 2   11/05/2023 Name: Joshua [name] MRN: [id number] DOB: Jan 21, 1936  Subjective: Joshua [name] is a 88 y.o. year old male who is a primary care patient of [name], DO. The Care Management team Engaged with patient Engaged with patient by telephone to assess and address transitions of care needs.   Consent to Services:  Patient was given information about care management services, agreed to services, and gave verbal consent to participate.   Assessment: TOC Outreach provided today. The patient had a visit to the ED on 11/02/23 due to a syncope collapse. He states he doesn't remember it. His blood pressure was a little low at 96/60. He was at 89% saturation rate. He is currently on 3L.min of oxygen and his sats are 00%. His blood pressure is 100/64. He states the Home Health nurse was there earlier. He is working with HHPT to increase his stamina but he leads a  mostly sedentary life. He has finished his ABX. He states he is feeling better.     SDOH Interventions    Flowsheet Row Patient Outreach from 11/05/2023 in [facility] POPULATION HEALTH DEPARTMENT Telephone from 10/29/2023 in [place] POPULATION HEALTH DEPARTMENT ED to Hosp-Admission (Discharged) from 10/25/2023 in [facility] 31M KIDNEY UNIT ED to Hosp-Admission (Discharged) from 10/10/2023 in [place] 2C CV PROGRESSIVE CARE Telephone from 05/14/2023 in [facility] Community Care Coordination ED to Hosp-Admission (Discharged) from 07/16/2022 in [place] [facility]  SDOH Interventions        Food Insecurity Interventions Intervention Not Indicated Intervention Not Indicated -- -- Intervention Not Indicated --  Housing Interventions Intervention Not Indicated Intervention Not Indicated -- -- -- Patient Refused  Transportation Interventions Intervention Not Indicated Intervention Not Indicated Inpatient TOC, Intervention  Not Indicated, Patient Resources (Friends/Family) Community Resources Provided, Inpatient TOC Intervention Not Indicated --  Utilities Interventions Intervention Not Indicated Intervention Not Indicated -- -- -- --        Goals Addressed             This Visit's Progress    TOC Outreach Program   On track    Current Barriers: (reviewed 11/05/23) Chronic Disease Management support and education needs related to CHF and COPD   RNCM Clinical Goal(s): (reviewed 11/05/23) Patient will work with the Care Management team over the next 30 days to address Transition of Care Barriers: Medication access Medication Management Diet/Nutrition/Food Resources Support at home Provider appointments Home Health services verbalize understanding of plan for management of CHF and COPD as evidenced by EMR, no hospital readmission in the next 30 days  through collaboration with RN Care manager, provider, and care team.   Interventions:(reviewed 11/05/23) Evaluation of current treatment plan related to  self management and patient's adherence to plan as established by provider   Heart Failure Interventions:  (Status:  New goal.) Short Term Goal (reviewed 11/05/23) Basic overview and discussion of pathophysiology of Heart Failure reviewed Provided education on low sodium diet Provided education about placing scale on hard, flat surface Advised patient to weigh each morning after emptying bladder Discussed importance of daily weight and advised patient to weigh and record daily Reviewed role of diuretics in prevention of fluid overload and management of heart failure; Discussed the importance of keeping all appointments with provider Provided patient with education about the role of exercise in the management of heart failure  COPD Interventions:  (Status:  New goal.)  Short Term Goal (reviewed 11/05/23) Advised patient to track and manage COPD triggers Provided instruction about proper use of medications  used for management of COPD including inhalers Advised patient to engage in light exercise as tolerated 3-5 days a week to aid in the the management of COPD Provided education about and advised patient to utilize infection prevention strategies to reduce risk of respiratory infection Discussed the importance of adequate rest and management of fatigue with COPD Screening for signs and symptoms of depression related to chronic disease state  Wear Oxygen at 2-3l/min continuous  Patient Goals/Self-Care Activities: (reviewed 11/05/23) Participate in Transition of Care Program/Attend [facility] scheduled calls Notify RN Care Manager of [facility] call rescheduling needs Take all medications as prescribed Attend all scheduled provider appointments Call pharmacy for medication refills 3-7 days in advance of running out of medications Call provider office for new concerns or questions   Follow Up Plan:  Telephone follow up appointment with care management team member scheduled for:  Wednesday March 26th at 3:30pm         Plan: The patient has been provided with contact information for the care management team and has been advised to call with any health related questions or concerns.   Routine follow-up and on-going assessment evaluation and education of disease processes, and recommended interventions for both chronic and acute medical conditions, will occur during each weekly visit during [facility] 30-day Program Outreach calls along with ongoing review of symptoms, medication reviews and reconciliation. Any updates, inconsistencies, discrepancies or acute care concerns will be addressed on the Care Plan and routed to the correct Practitioner if indicated.    The patient has been provided with contact information for the care management team and has been advised to call with any health-related questions or concerns. The patient verbalized understanding with current POC. The patient is directed to their insurance card  regarding availability of benefits coverage.   Deidre Ala, BSN, RN [place]  VBCI - [employer] [facility] RN Care Manager [phone]

## 2023-11-06 DIAGNOSIS — N179 Acute kidney failure, unspecified: Secondary | ICD-10-CM | POA: Diagnosis not present

## 2023-11-06 DIAGNOSIS — I9589 Other hypotension: Secondary | ICD-10-CM | POA: Diagnosis not present

## 2023-11-06 DIAGNOSIS — J9621 Acute and chronic respiratory failure with hypoxia: Secondary | ICD-10-CM | POA: Diagnosis not present

## 2023-11-06 DIAGNOSIS — J189 Pneumonia, unspecified organism: Secondary | ICD-10-CM | POA: Diagnosis not present

## 2023-11-06 DIAGNOSIS — J440 Chronic obstructive pulmonary disease with acute lower respiratory infection: Secondary | ICD-10-CM | POA: Diagnosis not present

## 2023-11-06 DIAGNOSIS — J439 Emphysema, unspecified: Secondary | ICD-10-CM | POA: Diagnosis not present

## 2023-11-07 ENCOUNTER — Ambulatory Visit (HOSPITAL_COMMUNITY)
Admission: RE | Admit: 2023-11-07 | Discharge: 2023-11-07 | Disposition: A | Discharge status: Home / Self Care | Source: Ambulatory Visit | Attending: Cardiology | Admitting: Cardiology

## 2023-11-07 DIAGNOSIS — I5032 Chronic diastolic (congestive) heart failure: Secondary | ICD-10-CM | POA: Diagnosis not present

## 2023-11-07 LAB — BASIC METABOLIC PANEL
Anion gap: 9 (ref 5–15)
BUN: 26 mg/dL — ABNORMAL HIGH (ref 8–23)
CO2: 37 mmol/L — ABNORMAL HIGH (ref 22–32)
Calcium: 10 mg/dL (ref 8.9–10.3)
Chloride: 95 mmol/L — ABNORMAL LOW (ref 98–111)
Creatinine, Ser: 1.84 mg/dL — ABNORMAL HIGH (ref 0.61–1.24)
GFR, Estimated: 35 mL/min — ABNORMAL LOW (ref >60)
Glucose, Bld: 105 mg/dL — ABNORMAL HIGH (ref 70–99)
Potassium: 3.5 mmol/L (ref 3.5–5.1)
Sodium: 141 mmol/L (ref 135–145)

## 2023-11-10 DIAGNOSIS — J440 Chronic obstructive pulmonary disease with acute lower respiratory infection: Secondary | ICD-10-CM | POA: Diagnosis not present

## 2023-11-10 DIAGNOSIS — N179 Acute kidney failure, unspecified: Secondary | ICD-10-CM | POA: Diagnosis not present

## 2023-11-10 DIAGNOSIS — J439 Emphysema, unspecified: Secondary | ICD-10-CM | POA: Diagnosis not present

## 2023-11-10 DIAGNOSIS — J189 Pneumonia, unspecified organism: Secondary | ICD-10-CM | POA: Diagnosis not present

## 2023-11-10 DIAGNOSIS — I9589 Other hypotension: Secondary | ICD-10-CM | POA: Diagnosis not present

## 2023-11-10 DIAGNOSIS — J9621 Acute and chronic respiratory failure with hypoxia: Secondary | ICD-10-CM | POA: Diagnosis not present

## 2023-11-11 DIAGNOSIS — J189 Pneumonia, unspecified organism: Secondary | ICD-10-CM | POA: Diagnosis not present

## 2023-11-11 DIAGNOSIS — I9589 Other hypotension: Secondary | ICD-10-CM | POA: Diagnosis not present

## 2023-11-11 DIAGNOSIS — J9621 Acute and chronic respiratory failure with hypoxia: Secondary | ICD-10-CM | POA: Diagnosis not present

## 2023-11-11 DIAGNOSIS — N179 Acute kidney failure, unspecified: Secondary | ICD-10-CM | POA: Diagnosis not present

## 2023-11-11 DIAGNOSIS — J439 Emphysema, unspecified: Secondary | ICD-10-CM | POA: Diagnosis not present

## 2023-11-11 DIAGNOSIS — J440 Chronic obstructive pulmonary disease with acute lower respiratory infection: Secondary | ICD-10-CM | POA: Diagnosis not present

## 2023-11-12 ENCOUNTER — Other Ambulatory Visit: Payer: Self-pay

## 2023-11-12 DIAGNOSIS — N179 Acute kidney failure, unspecified: Secondary | ICD-10-CM | POA: Diagnosis not present

## 2023-11-12 DIAGNOSIS — J189 Pneumonia, unspecified organism: Secondary | ICD-10-CM | POA: Diagnosis not present

## 2023-11-12 DIAGNOSIS — J439 Emphysema, unspecified: Secondary | ICD-10-CM | POA: Diagnosis not present

## 2023-11-12 DIAGNOSIS — J449 Chronic obstructive pulmonary disease, unspecified: Secondary | ICD-10-CM | POA: Diagnosis not present

## 2023-11-12 NOTE — Patient Outreach (Signed)
 Care Management  Transitions of Care Program Transitions of Care Post-discharge week 3  11/12/2023 Name: Joshua [name] MRN: [id number] DOB: 12/07/1935  Subjective: Joshua [name] is a 88 y.o. year old male who is a primary care patient of [name], DO. The Care Management team was unable to reach the patient by phone to assess and address transitions of care needs.   Plan: Additional outreach attempts will be made to reach the patient enrolled in the [facility] Program (Post Inpatient/ED Visit).  Deidre Ala, BSN, RN [place]  VBCI - [employer] [facility] RN Care Manager [phone]

## 2023-11-13 ENCOUNTER — Encounter (HOSPITAL_COMMUNITY): Payer: Self-pay | Admitting: Emergency Medicine

## 2023-11-13 ENCOUNTER — Inpatient Hospital Stay (HOSPITAL_COMMUNITY)
Admission: EM | Admit: 2023-11-13 | Discharge: 2023-11-17 | DRG: 193 | Disposition: A | Discharge status: Home Health | Attending: Internal Medicine | Admitting: Internal Medicine

## 2023-11-13 ENCOUNTER — Telehealth: Payer: Self-pay

## 2023-11-13 ENCOUNTER — Emergency Department (HOSPITAL_COMMUNITY)

## 2023-11-13 ENCOUNTER — Other Ambulatory Visit: Payer: Self-pay

## 2023-11-13 DIAGNOSIS — Z8249 Family history of ischemic heart disease and other diseases of the circulatory system: Secondary | ICD-10-CM

## 2023-11-13 DIAGNOSIS — Z8616 Personal history of COVID-19: Secondary | ICD-10-CM

## 2023-11-13 DIAGNOSIS — Z87891 Personal history of nicotine dependence: Secondary | ICD-10-CM | POA: Diagnosis not present

## 2023-11-13 DIAGNOSIS — D3501 Benign neoplasm of right adrenal gland: Secondary | ICD-10-CM | POA: Diagnosis present

## 2023-11-13 DIAGNOSIS — N1832 Chronic kidney disease, stage 3b: Secondary | ICD-10-CM | POA: Diagnosis not present

## 2023-11-13 DIAGNOSIS — I700 Atherosclerosis of aorta: Secondary | ICD-10-CM | POA: Diagnosis present

## 2023-11-13 DIAGNOSIS — J9621 Acute and chronic respiratory failure with hypoxia: Secondary | ICD-10-CM | POA: Diagnosis not present

## 2023-11-13 DIAGNOSIS — J449 Chronic obstructive pulmonary disease, unspecified: Secondary | ICD-10-CM | POA: Diagnosis not present

## 2023-11-13 DIAGNOSIS — Z66 Do not resuscitate: Secondary | ICD-10-CM | POA: Diagnosis present

## 2023-11-13 DIAGNOSIS — I9589 Other hypotension: Secondary | ICD-10-CM | POA: Diagnosis present

## 2023-11-13 DIAGNOSIS — E7800 Pure hypercholesterolemia, unspecified: Secondary | ICD-10-CM | POA: Diagnosis present

## 2023-11-13 DIAGNOSIS — I252 Old myocardial infarction: Secondary | ICD-10-CM

## 2023-11-13 DIAGNOSIS — I959 Hypotension, unspecified: Secondary | ICD-10-CM | POA: Diagnosis present

## 2023-11-13 DIAGNOSIS — R5381 Other malaise: Secondary | ICD-10-CM | POA: Diagnosis present

## 2023-11-13 DIAGNOSIS — Z96652 Presence of left artificial knee joint: Secondary | ICD-10-CM | POA: Diagnosis present

## 2023-11-13 DIAGNOSIS — E1169 Type 2 diabetes mellitus with other specified complication: Secondary | ICD-10-CM

## 2023-11-13 DIAGNOSIS — E872 Acidosis, unspecified: Secondary | ICD-10-CM | POA: Diagnosis present

## 2023-11-13 DIAGNOSIS — J9811 Atelectasis: Secondary | ICD-10-CM | POA: Diagnosis not present

## 2023-11-13 DIAGNOSIS — I5032 Chronic diastolic (congestive) heart failure: Secondary | ICD-10-CM | POA: Diagnosis present

## 2023-11-13 DIAGNOSIS — I2724 Chronic thromboembolic pulmonary hypertension: Secondary | ICD-10-CM | POA: Diagnosis not present

## 2023-11-13 DIAGNOSIS — A419 Sepsis, unspecified organism: Principal | ICD-10-CM

## 2023-11-13 DIAGNOSIS — E1100 Type 2 diabetes mellitus with hyperosmolarity without nonketotic hyperglycemic-hyperosmolar coma (NKHHC): Secondary | ICD-10-CM | POA: Diagnosis not present

## 2023-11-13 DIAGNOSIS — N179 Acute kidney failure, unspecified: Secondary | ICD-10-CM | POA: Diagnosis not present

## 2023-11-13 DIAGNOSIS — R509 Fever, unspecified: Secondary | ICD-10-CM | POA: Diagnosis not present

## 2023-11-13 DIAGNOSIS — N401 Enlarged prostate with lower urinary tract symptoms: Secondary | ICD-10-CM | POA: Diagnosis present

## 2023-11-13 DIAGNOSIS — J432 Centrilobular emphysema: Secondary | ICD-10-CM | POA: Diagnosis not present

## 2023-11-13 DIAGNOSIS — I2781 Cor pulmonale (chronic): Secondary | ICD-10-CM | POA: Diagnosis not present

## 2023-11-13 DIAGNOSIS — R0902 Hypoxemia: Secondary | ICD-10-CM | POA: Diagnosis not present

## 2023-11-13 DIAGNOSIS — I130 Hypertensive heart and chronic kidney disease with heart failure and stage 1 through stage 4 chronic kidney disease, or unspecified chronic kidney disease: Secondary | ICD-10-CM | POA: Diagnosis present

## 2023-11-13 DIAGNOSIS — J440 Chronic obstructive pulmonary disease with acute lower respiratory infection: Secondary | ICD-10-CM | POA: Diagnosis not present

## 2023-11-13 DIAGNOSIS — R918 Other nonspecific abnormal finding of lung field: Secondary | ICD-10-CM | POA: Diagnosis not present

## 2023-11-13 DIAGNOSIS — I2721 Secondary pulmonary arterial hypertension: Secondary | ICD-10-CM | POA: Diagnosis present

## 2023-11-13 DIAGNOSIS — K2100 Gastro-esophageal reflux disease with esophagitis, without bleeding: Secondary | ICD-10-CM | POA: Diagnosis not present

## 2023-11-13 DIAGNOSIS — E1122 Type 2 diabetes mellitus with diabetic chronic kidney disease: Secondary | ICD-10-CM | POA: Diagnosis present

## 2023-11-13 DIAGNOSIS — I5082 Biventricular heart failure: Secondary | ICD-10-CM | POA: Diagnosis present

## 2023-11-13 DIAGNOSIS — M109 Gout, unspecified: Secondary | ICD-10-CM | POA: Diagnosis not present

## 2023-11-13 DIAGNOSIS — Z91018 Allergy to other foods: Secondary | ICD-10-CM

## 2023-11-13 DIAGNOSIS — R339 Retention of urine, unspecified: Secondary | ICD-10-CM | POA: Diagnosis present

## 2023-11-13 DIAGNOSIS — Y95 Nosocomial condition: Secondary | ICD-10-CM | POA: Diagnosis present

## 2023-11-13 DIAGNOSIS — I5033 Acute on chronic diastolic (congestive) heart failure: Secondary | ICD-10-CM | POA: Diagnosis not present

## 2023-11-13 DIAGNOSIS — T502X5A Adverse effect of carbonic-anhydrase inhibitors, benzothiadiazides and other diuretics, initial encounter: Secondary | ICD-10-CM | POA: Diagnosis not present

## 2023-11-13 DIAGNOSIS — R0689 Other abnormalities of breathing: Secondary | ICD-10-CM | POA: Diagnosis not present

## 2023-11-13 DIAGNOSIS — Z9981 Dependence on supplemental oxygen: Secondary | ICD-10-CM | POA: Diagnosis not present

## 2023-11-13 DIAGNOSIS — E861 Hypovolemia: Secondary | ICD-10-CM | POA: Diagnosis not present

## 2023-11-13 DIAGNOSIS — J439 Emphysema, unspecified: Secondary | ICD-10-CM | POA: Diagnosis present

## 2023-11-13 DIAGNOSIS — R531 Weakness: Secondary | ICD-10-CM | POA: Diagnosis not present

## 2023-11-13 DIAGNOSIS — J189 Pneumonia, unspecified organism: Secondary | ICD-10-CM | POA: Diagnosis not present

## 2023-11-13 DIAGNOSIS — Z7901 Long term (current) use of anticoagulants: Secondary | ICD-10-CM

## 2023-11-13 DIAGNOSIS — Z86711 Personal history of pulmonary embolism: Secondary | ICD-10-CM

## 2023-11-13 DIAGNOSIS — E119 Type 2 diabetes mellitus without complications: Secondary | ICD-10-CM

## 2023-11-13 DIAGNOSIS — I10 Essential (primary) hypertension: Secondary | ICD-10-CM | POA: Diagnosis present

## 2023-11-13 DIAGNOSIS — K219 Gastro-esophageal reflux disease without esophagitis: Secondary | ICD-10-CM | POA: Diagnosis present

## 2023-11-13 DIAGNOSIS — G4733 Obstructive sleep apnea (adult) (pediatric): Secondary | ICD-10-CM | POA: Diagnosis present

## 2023-11-13 DIAGNOSIS — K449 Diaphragmatic hernia without obstruction or gangrene: Secondary | ICD-10-CM | POA: Diagnosis present

## 2023-11-13 DIAGNOSIS — Z882 Allergy status to sulfonamides status: Secondary | ICD-10-CM

## 2023-11-13 DIAGNOSIS — I272 Pulmonary hypertension, unspecified: Secondary | ICD-10-CM | POA: Diagnosis not present

## 2023-11-13 DIAGNOSIS — Z79899 Other long term (current) drug therapy: Secondary | ICD-10-CM | POA: Diagnosis not present

## 2023-11-13 LAB — URINALYSIS, W/ REFLEX TO CULTURE (INFECTION SUSPECTED)
Bacteria, UA: NONE SEEN
Bilirubin Urine: NEGATIVE
Glucose, UA: >=500 mg/dL — AB
Hgb urine dipstick: NEGATIVE
Ketones, ur: NEGATIVE mg/dL
Leukocytes,Ua: NEGATIVE
Nitrite: NEGATIVE
Protein, ur: NEGATIVE mg/dL
Specific Gravity, Urine: 1.009 (ref 1.005–1.030)
pH: 7 (ref 5.0–8.0)

## 2023-11-13 LAB — CBC WITH DIFFERENTIAL/PLATELET
Abs Immature Granulocytes: 0.06 10*3/uL (ref 0.00–0.07)
Basophils Absolute: 0 10*3/uL (ref 0.0–0.1)
Basophils Relative: 0 %
Eosinophils Absolute: 0 10*3/uL (ref 0.0–0.5)
Eosinophils Relative: 0 %
HCT: 38.3 % — ABNORMAL LOW (ref 39.0–52.0)
Hemoglobin: 11.8 g/dL — ABNORMAL LOW (ref 13.0–17.0)
Immature Granulocytes: 1 %
Lymphocytes Relative: 5 %
Lymphs Abs: 0.7 10*3/uL (ref 0.7–4.0)
MCH: 27.4 pg (ref 26.0–34.0)
MCHC: 30.8 g/dL (ref 30.0–36.0)
MCV: 88.9 fL (ref 80.0–100.0)
Monocytes Absolute: 0.7 10*3/uL (ref 0.1–1.0)
Monocytes Relative: 6 %
Neutro Abs: 10.7 10*3/uL — ABNORMAL HIGH (ref 1.7–7.7)
Neutrophils Relative %: 88 %
Platelets: 274 10*3/uL (ref 150–400)
RBC: 4.31 MIL/uL (ref 4.22–5.81)
RDW: 17.8 % — ABNORMAL HIGH (ref 11.5–15.5)
WBC: 12.2 10*3/uL — ABNORMAL HIGH (ref 4.0–10.5)
nRBC: 0 % (ref 0.0–0.2)

## 2023-11-13 LAB — COMPREHENSIVE METABOLIC PANEL WITH GFR
ALT: 18 U/L (ref 0–44)
AST: 40 U/L (ref 15–41)
Albumin: 3.5 g/dL (ref 3.5–5.0)
Alkaline Phosphatase: 81 U/L (ref 38–126)
Anion gap: 10 (ref 5–15)
BUN: 23 mg/dL (ref 8–23)
CO2: 33 mmol/L — ABNORMAL HIGH (ref 22–32)
Calcium: 10 mg/dL (ref 8.9–10.3)
Chloride: 97 mmol/L — ABNORMAL LOW (ref 98–111)
Creatinine, Ser: 1.8 mg/dL — ABNORMAL HIGH (ref 0.61–1.24)
GFR, Estimated: 36 mL/min — ABNORMAL LOW (ref >60)
Glucose, Bld: 128 mg/dL — ABNORMAL HIGH (ref 70–99)
Potassium: 4.7 mmol/L (ref 3.5–5.1)
Sodium: 140 mmol/L (ref 135–145)
Total Bilirubin: 1.2 mg/dL (ref 0.0–1.2)
Total Protein: 7.5 g/dL (ref 6.5–8.1)

## 2023-11-13 LAB — I-STAT CG4 LACTIC ACID, ED
Lactic Acid, Venous: 2.7 mmol/L (ref 0.5–1.9)
Lactic Acid, Venous: 0.8 mmol/L (ref 0.5–1.9)

## 2023-11-13 LAB — GLUCOSE, CAPILLARY: Glucose-Capillary: 89 mg/dL (ref 70–99)

## 2023-11-13 LAB — RESP PANEL BY RT-PCR (RSV, FLU A&B, COVID)  RVPGX2
Influenza A by PCR: NEGATIVE
Influenza B by PCR: NEGATIVE
Resp Syncytial Virus by PCR: NEGATIVE
SARS Coronavirus 2 by RT PCR: NEGATIVE

## 2023-11-13 LAB — APTT: aPTT: 31 s (ref 24–36)

## 2023-11-13 LAB — PROTIME-INR
INR: 1.5 — ABNORMAL HIGH (ref 0.8–1.2)
Prothrombin Time: 18.7 s — ABNORMAL HIGH (ref 11.4–15.2)

## 2023-11-13 MED ORDER — UMECLIDINIUM BROMIDE 62.5 MCG/ACT IN AEPB
1.0000 | INHALATION_SPRAY | Freq: Every day | RESPIRATORY_TRACT | Status: DC
  Administered 2023-11-15 – 2023-11-17 (×3): 1 via RESPIRATORY_TRACT
  Filled 2023-11-13 (×2): qty 7

## 2023-11-13 MED ORDER — MIDODRINE HCL 5 MG PO TABS
5.0000 mg | ORAL_TABLET | Freq: Once | ORAL | Status: AC
  Administered 2023-11-13: 5 mg via ORAL
  Filled 2023-11-13: qty 1

## 2023-11-13 MED ORDER — VANCOMYCIN HCL IN DEXTROSE 1-5 GM/200ML-% IV SOLN
1000.0000 mg | Freq: Once | INTRAVENOUS | Status: AC
  Administered 2023-11-13: 1000 mg via INTRAVENOUS
  Filled 2023-11-13: qty 200

## 2023-11-13 MED ORDER — RIOCIGUAT 2 MG PO TABS
2.0000 mg | ORAL_TABLET | Freq: Three times a day (TID) | ORAL | Status: DC
  Administered 2023-11-13 – 2023-11-17 (×12): 2 mg via ORAL
  Filled 2023-11-13 (×6): qty 1
  Filled 2023-11-13: qty 2
  Filled 2023-11-13 (×9): qty 1

## 2023-11-13 MED ORDER — SODIUM CHLORIDE 0.9 % IV SOLN
2.0000 g | Freq: Three times a day (TID) | INTRAVENOUS | Status: DC
  Administered 2023-11-13 – 2023-11-14 (×3): 2 g via INTRAVENOUS
  Filled 2023-11-13 (×3): qty 12.5

## 2023-11-13 MED ORDER — ATORVASTATIN CALCIUM 40 MG PO TABS
40.0000 mg | ORAL_TABLET | Freq: Every day | ORAL | Status: DC
  Administered 2023-11-14 – 2023-11-17 (×4): 40 mg via ORAL
  Filled 2023-11-13 (×4): qty 1

## 2023-11-13 MED ORDER — APIXABAN 5 MG PO TABS
5.0000 mg | ORAL_TABLET | Freq: Two times a day (BID) | ORAL | Status: DC
  Administered 2023-11-13 – 2023-11-17 (×8): 5 mg via ORAL
  Filled 2023-11-13 (×8): qty 1

## 2023-11-13 MED ORDER — SODIUM CHLORIDE 0.9 % IV BOLUS
1000.0000 mL | Freq: Once | INTRAVENOUS | Status: AC
  Administered 2023-11-13: 1000 mL via INTRAVENOUS

## 2023-11-13 MED ORDER — INSULIN ASPART 100 UNIT/ML IJ SOLN: 0.0000 [IU] | Freq: Three times a day (TID) | INTRAMUSCULAR | Status: DC

## 2023-11-13 MED ORDER — PANTOPRAZOLE SODIUM 40 MG PO TBEC
40.0000 mg | DELAYED_RELEASE_TABLET | Freq: Every morning | ORAL | Status: DC
  Administered 2023-11-14 – 2023-11-17 (×4): 40 mg via ORAL
  Filled 2023-11-13 (×4): qty 1

## 2023-11-13 MED ORDER — FINASTERIDE 5 MG PO TABS
5.0000 mg | ORAL_TABLET | Freq: Every day | ORAL | Status: DC
  Administered 2023-11-13 – 2023-11-16 (×4): 5 mg via ORAL
  Filled 2023-11-13 (×4): qty 1

## 2023-11-13 MED ORDER — SODIUM CHLORIDE 0.9% FLUSH
3.0000 mL | Freq: Two times a day (BID) | INTRAVENOUS | Status: DC
  Administered 2023-11-13 – 2023-11-17 (×8): 3 mL via INTRAVENOUS

## 2023-11-13 MED ORDER — ALBUTEROL SULFATE (2.5 MG/3ML) 0.083% IN NEBU: 3.0000 mL | INHALATION_SOLUTION | RESPIRATORY_TRACT | Status: DC | PRN

## 2023-11-13 MED ORDER — MIDODRINE HCL 5 MG PO TABS
15.0000 mg | ORAL_TABLET | Freq: Three times a day (TID) | ORAL | Status: DC
  Administered 2023-11-14 – 2023-11-17 (×10): 15 mg via ORAL
  Filled 2023-11-13 (×10): qty 3

## 2023-11-13 MED ORDER — INSULIN ASPART 100 UNIT/ML IJ SOLN: 0.0000 [IU] | Freq: Every day | INTRAMUSCULAR | Status: DC

## 2023-11-13 MED ORDER — ACETAMINOPHEN 650 MG RE SUPP: 650.0000 mg | Freq: Four times a day (QID) | RECTAL | Status: DC | PRN

## 2023-11-13 MED ORDER — ALLOPURINOL 100 MG PO TABS
100.0000 mg | ORAL_TABLET | Freq: Every morning | ORAL | Status: DC
Start: 2023-11-14 — End: 2023-11-18
  Administered 2023-11-14 – 2023-11-17 (×4): 100 mg via ORAL
  Filled 2023-11-13 (×4): qty 1

## 2023-11-13 MED ORDER — ACETAMINOPHEN 325 MG PO TABS: 650.0000 mg | ORAL_TABLET | Freq: Four times a day (QID) | ORAL | Status: DC | PRN

## 2023-11-13 MED ORDER — GABAPENTIN 300 MG PO CAPS
300.0000 mg | ORAL_CAPSULE | Freq: Three times a day (TID) | ORAL | Status: DC
  Administered 2023-11-13 – 2023-11-17 (×13): 300 mg via ORAL
  Filled 2023-11-13 (×13): qty 1

## 2023-11-13 MED ORDER — SODIUM CHLORIDE 0.9 % IV SOLN
2.0000 g | Freq: Once | INTRAVENOUS | Status: AC
  Administered 2023-11-13: 2 g via INTRAVENOUS
  Filled 2023-11-13: qty 12.5

## 2023-11-13 MED ORDER — ARFORMOTEROL TARTRATE 15 MCG/2ML IN NEBU
15.0000 ug | INHALATION_SOLUTION | Freq: Two times a day (BID) | RESPIRATORY_TRACT | Status: DC
  Administered 2023-11-14 – 2023-11-17 (×8): 15 ug via RESPIRATORY_TRACT
  Filled 2023-11-13 (×10): qty 2

## 2023-11-13 MED ORDER — POLYETHYLENE GLYCOL 3350 17 G PO PACK: 17.0000 g | PACK | Freq: Every day | ORAL | Status: DC | PRN

## 2023-11-13 MED ORDER — VANCOMYCIN HCL 1750 MG/350ML IV SOLN
1750.0000 mg | INTRAVENOUS | Status: DC
  Filled 2023-11-13: qty 350

## 2023-11-13 NOTE — Patient Outreach (Signed)
 Care Management  Transitions of Care Program Transitions of Care Post-discharge week 3  11/13/2023 Name: Joshua [name] MRN: [id number] DOB: 1936-02-26  Subjective: Joshua [name] is a 88 y.o. year old male who is a primary care patient of [name], DO. The Care Management team was unable to reach the patient by phone to assess and address transitions of care needs.   Plan: Additional outreach attempts will be made to reach the patient enrolled in the [facility] Program (Post Inpatient/ED Visit).  The patient is in the ED today  Deidre Ala, BSN, RN [place]  VBCI - [facility] RN Care Manager [phone]

## 2023-11-13 NOTE — ED Triage Notes (Signed)
 Pt here from home with c/o fever chills and weakness  has been on antibiotics off and on for a few weeks , received 650 mg of tylenol and 400 of fluid from ems on 2 liters  otc , ems moved up to 5 due low sats

## 2023-11-13 NOTE — ED Provider Notes (Signed)
 [place] [facility] AT [facility] Provider Note   CSN: [id number] Arrival date & time: 11/13/23  1348     History  Chief Complaint  Patient presents with   Weakness   Fever    Joshua [name] is a 88 y.o. male.  There is an 88 year old male presenting emergency department for generalized malaise, fever and chills.  States he has been feeling unwell since yesterday, symptoms worsened today. Denies headache, vision changes, chest pain, shortness of breath, abdominal pain no nausea no vomiting. Feeling improved after given tylenol and 400 IVFs by EMS.    Weakness Associated symptoms: fever   Fever      Home Medications Prior to Admission medications   Medication Sig Start Date End Date Taking? Authorizing Provider  ADEMPAS 2 MG TABS TAKE 1 TABLET BY MOUTH 3 TIMES A DAY AS DIRECTED. DO NOT HANDLE IF PREGNANT 08/26/23   Laurey Morale, MD  albuterol (VENTOLIN HFA) 108 (90 Base) MCG/ACT inhaler Inhale 1 puff into the lungs every 4 (four) hours as needed for shortness of breath.    [provider]  allopurinol (ZYLOPRIM) 100 MG tablet Take 100 mg by mouth every morning.    [provider]  atorvastatin (LIPITOR) 40 MG tablet TAKE 1 TABLET BY MOUTH EVERY DAY 06/30/23   Milford, Anderson Malta, FNP  azelastine (ASTELIN) 0.1 % nasal spray Place 2 sprays into both nostrils 2 (two) times daily as needed for rhinitis. Use in each nostril as directed    [provider]  Cinnamon 500 MG capsule Take 500 mg by mouth every morning.    [provider]  dapagliflozin propanediol (FARXIGA) 10 MG TABS tablet Take 1 tablet (10 mg total) by mouth daily before breakfast. 02/25/23   Milford, Anderson Malta, FNP  ELIQUIS 5 MG TABS tablet TAKE 1 TABLET BY MOUTH TWICE A DAY 07/21/23   Laurey Morale, MD  finasteride (PROSCAR) 5 MG tablet Take 5 mg by mouth at bedtime.    [provider]  gabapentin (NEURONTIN) 300 MG capsule Take 300 mg by mouth 3  (three) times daily. 04/04/22   [provider]  magnesium oxide (MAG-OX) 400 (240 Mg) MG tablet TAKE 1 TABLET BY MOUTH EVERY DAY 09/29/23   Laurey Morale, MD  metoCLOPramide (REGLAN) 5 MG tablet Take 5 mg by mouth 3 (three) times daily before meals.    [provider]  midodrine (PROAMATINE) 5 MG tablet Take 3 tablets (15 mg total) by mouth 3 (three) times daily with meals. 10/31/23   Jacklynn Ganong, FNP  Multiple Vitamin (MULTIVITAMIN WITH MINERALS) TABS tablet Take 1 tablet by mouth daily. 05/01/21   Marguerita Merles Latif, DO  OXYGEN Inhale 3 L into the lungs continuous.    [provider]  pantoprazole (PROTONIX) 40 MG tablet Take 1 tablet (40 mg total) by mouth every morning. 03/07/23   Cobb, Ruby Cola, NP  potassium chloride (KLOR-CON) 10 MEQ tablet Take 10 mEq by mouth 2 (two) times daily.    [provider]  Tiotropium Bromide-Olodaterol (STIOLTO RESPIMAT) 2.5-2.5 MCG/ACT AERS Inhale 2 puffs into the lungs daily. Patient taking differently: Inhale 1 puff into the lungs daily. 08/04/23   Oretha Milch, MD  torsemide (DEMADEX) 20 MG tablet Take 20 mg by mouth daily.    [provider]      Allergies    Other, Sunflower oil, and Sulfa antibiotics    Review of Systems   Review of  Systems  Constitutional:  Positive for fever.  Neurological:  Positive for weakness.    Physical Exam Updated Vital Signs BP (!) 115/53   Pulse 80   Temp 99.9 F (37.7 C)   Resp 15   Ht 6\' 3"  (1.905 m)   Wt 78.9 kg   SpO2 97%   BMI 21.75 kg/m  Physical Exam Vitals and nursing note reviewed.  Constitutional:      General: He is not in acute distress.    Appearance: He is not toxic-appearing.  HENT:     Head: Normocephalic.  Eyes:     Conjunctiva/sclera: Conjunctivae normal.  Cardiovascular:     Rate and Rhythm: Normal rate and regular rhythm.  Pulmonary:     Effort: Pulmonary effort is normal.     Breath sounds: Normal breath sounds.   Abdominal:     General: Abdomen is flat. There is no distension.     Palpations: Abdomen is soft.     Tenderness: There is no abdominal tenderness. There is no guarding or rebound.  Musculoskeletal:     Cervical back: Normal range of motion.     Right lower leg: No edema.     Left lower leg: No edema.  Skin:    General: Skin is warm.     Capillary Refill: Capillary refill takes less than 2 seconds.  Neurological:     Mental Status: He is alert.  Psychiatric:        Mood and Affect: Mood normal.        Behavior: Behavior normal.     ED Results / Procedures / Treatments   Labs (all labs ordered are listed, but only abnormal results are displayed) Labs Reviewed  COMPREHENSIVE METABOLIC PANEL WITH GFR - Abnormal; Notable for the following components:      Result Value   Chloride 97 (*)    CO2 33 (*)    Glucose, Bld 128 (*)    Creatinine, Ser 1.80 (*)    GFR, Estimated 36 (*)    All other components within normal limits  CBC WITH DIFFERENTIAL/PLATELET - Abnormal; Notable for the following components:   WBC 12.2 (*)    Hemoglobin 11.8 (*)    HCT 38.3 (*)    RDW 17.8 (*)    Neutro Abs 10.7 (*)    All other components within normal limits  PROTIME-INR - Abnormal; Notable for the following components:   Prothrombin Time 18.7 (*)    INR 1.5 (*)    All other components within normal limits  URINALYSIS, W/ REFLEX TO CULTURE (INFECTION SUSPECTED) - Abnormal; Notable for the following components:   Glucose, UA >=500 (*)    All other components within normal limits  I-STAT CG4 LACTIC ACID, ED - Abnormal; Notable for the following components:   Lactic Acid, Venous 2.7 (*)    All other components within normal limits  RESP PANEL BY RT-PCR (RSV, FLU A&B, COVID)  RVPGX2  CULTURE, BLOOD (ROUTINE X 2)  CULTURE, BLOOD (ROUTINE X 2)  APTT  I-STAT CG4 LACTIC ACID, ED    EKG EKG Interpretation Date/Time:  Thursday November 13 2023 14:29:51 EDT Ventricular Rate:  90 PR  Interval:  144 QRS Duration:  161 QT Interval:  391 QTC Calculation: 479 R Axis:   128  Text Interpretation: Sinus rhythm RBBB and LPFB Confirmed by Estanislado Pandy [phone]) on 11/13/2023 4:00:36 PM  Radiology DG Chest Port 1 View Result Date: 11/13/2023 CLINICAL DATA:  Questionable sepsis - evaluate for abnormality EXAM:  PORTABLE CHEST 1 VIEW COMPARISON:  Chest x-ray 11/02/2023.  Chest CT 04/25/2023 FINDINGS: There is hyperinflation of the lungs compatible with COPD. Heart and mediastinal contours are within normal limits. Prominence of the central pulmonary vessels could reflect pulmonary arterial hypertension as described on prior CT. Focal patchy left mid and lower lung airspace opacities are similar to prior study and could reflect pneumonia. No confluent opacity on the right. No effusions or acute bony abnormality. IMPRESSION: COPD. Prominent pulmonary vessels likely reflects pulmonary arterial hypertension. Patchy left mid and lower lung airspace opacities concerning for pneumonia, similar to prior study. Electronically Signed   By: Charlett Nose M.D.   On: 11/13/2023 14:47    Procedures .Critical Care  Performed by: Coral Spikes, DO Authorized by: Coral Spikes, DO   Critical care provider statement:    Critical care time (minutes):  30   Critical care was time spent personally by me on the following activities:  Development of treatment plan with patient or surrogate, discussions with consultants, evaluation of patient's response to treatment, examination of patient, ordering and review of laboratory studies, ordering and review of radiographic studies, ordering and performing treatments and interventions, pulse oximetry, re-evaluation of patient's condition and review of old charts   Care discussed with: admitting provider       Medications Ordered in ED Medications  sodium chloride 0.9 % bolus 1,000 mL (1,000 mLs Intravenous New Bag/Given 11/13/23 1502)  midodrine (PROAMATINE)  tablet 5 mg (5 mg Oral Given 11/13/23 1501)  vancomycin (VANCOCIN) IVPB 1000 mg/200 mL premix (1,000 mg Intravenous New Bag/Given 11/13/23 1510)  ceFEPIme (MAXIPIME) 2 g in sodium chloride 0.9 % 100 mL IVPB (0 g Intravenous Stopped 11/13/23 1555)    ED Course/ Medical Decision Making/ A&P Clinical Course as of 11/13/23 1646  Thu Nov 13, 2023  1441 Echo 10/11/23: "1. Left ventricular ejection fraction, by estimation, is 60 to 65%. The  left ventricle has normal function. The left ventricle has no regional  wall motion abnormalities. Left ventricular diastolic parameters are  consistent with Grade I diastolic  dysfunction (impaired relaxation).  " [TY]  1441 Admitted 10/25/23 for PNA; PMH per review :"PMH significant for COPD on 2 L O2 at home, pulmonary hypertension, CHF, chronic PE on Eliquis, CKD, chronic hypotension on midodrine, HLD, OSA, hiatal hernia, BPH, chronic urinary retention, does self cath 3 times daily as needed for years" [TY]  1504 DG Chest Port 1 View IMPRESSION: COPD. Prominent pulmonary vessels likely reflects pulmonary arterial hypertension.  Patchy left mid and lower lung airspace opacities concerning for pneumonia, similar to prior study.   Electronically Signed   By: Charlett Nose M.D.   [TY]  [phone] Appears patient with left-sided pneumonia, does have leukocytosis elevated lactate.  Blood pressure is soft, but is on midodrine for chronic hypotension.  Documented history of CHF, gentle IV fluids.  Recent hospitalization.  Cover with hospital-acquired pneumonia antibiotics.  Will plan for admission.  Awaiting further labs. [TY]  1559 Comprehensive panel with hyperglycemia.  Does not appear to be in DKA.  Normal renal function.  No transaminitis to suggest hepatobiliary disease. [TY]    Clinical Course User Index [TY] Coral Spikes, DO                                 Medical Decision Making This is an 88 year old male presenting emergency department with concerning  vital signs for sepsis  in the setting of generalized malaise,weakness and chills..  Was given Tylenol, 400 mL IV fluids; EMS reported low oxygen saturations and increased oxygen to 5 L, patient titrated down and maintaining saturation 4 L.  Typically on 2.  Clinically nontoxic appearing.  Coarse breath sounds, does not appear to be in respiratory distress. Concerning workup today for sepsis secondary to pneumonia.  Blood pressure soft, history of the same.  Was given home midodrine.  Blood pressure has improved.  Started on antibiotics.  IV fluids given.  See ED course for further MDM and disposition.  Amount and/or Complexity of Data Reviewed Labs: ordered. Radiology: ordered. Decision-making details documented in ED Course. Discussion of management or test interpretation with external provider(s): Hospitalist.   Risk Prescription drug management. Decision regarding hospitalization.         Final Clinical Impression(s) / ED Diagnoses Final diagnoses:  Sepsis, due to unspecified organism, unspecified whether acute organ dysfunction present [facility])  Pneumonia of left lung due to infectious organism, unspecified part of lung    Rx / DC Orders ED Discharge Orders     None         Coral Spikes, DO 11/13/23 1646

## 2023-11-13 NOTE — ED Notes (Signed)
 Pt pharmacy medication sheet is placed in a pt belongings bag with his label. Belongings bag placed on back of bed.

## 2023-11-13 NOTE — Progress Notes (Signed)
 Pharmacy Antibiotic Note  Joshua [name] is a 88 y.o. male admitted on 11/13/2023 and now being treated for  pneuomonia . Pharmacy has been consulted for vancomycin and cefepime dosing. Patient is afebrile, WBC 12.2. CXR with patchy left mid-lower lobe opacities.  Plan: Vancomycin 1750 mg IV every 36 hours.  Goal trough 15-20 mcg/mL. Cefepime 2000 mg IV every 8 hours. Monitor fever, WBC, and micro data.  Height: 6\' 3"  (190.5 cm) Weight: 78.9 kg (174 lb) IBW/kg (Calculated) : 84.5  Temp (24hrs), Avg:99.9 F (37.7 C), Min:99.9 F (37.7 C), Max:99.9 F (37.7 C)  Recent Labs  Lab 11/07/23 1147 11/13/23 1448 11/13/23 1516  WBC  --  12.2*  --   CREATININE 1.84* 1.80*  --   LATICACIDVEN  --   --  2.7*    Estimated Creatinine Clearance: 33.5 mL/min (A) (by C-G formula based on SCr of 1.8 mg/dL (H)).    Allergies  Allergen Reactions   Other Swelling    Beer- Swelling    Sunflower Oil Swelling   Sulfa Antibiotics Rash    Antimicrobials this admission: Vancomycin 3/27 >>  Cefepime 3/27 >>   Dose adjustments this admission:  Microbiology results: 3/27 BCx: pending  Thank you for allowing pharmacy to be a part of this patient's care.  Wilmer Floor 11/13/2023 5:32 PM

## 2023-11-13 NOTE — H&P (Addendum)
 History and Physical   [name]:[id number] DOB: 11-07-1935 DOA: 11/13/2023  PCP: [name], DO   Patient coming from: Home  Chief Complaint: Fever, weakness  HPI: Joshua [name] is a 88 y.o. male with medical history significant of hypertension, hypertension, hyperlipidemia, GERD, diabetes, COPD, chronic respiratory failure on 2 L, chronic diastolic CHF, pulmonary hypertension, CTEPH, cor pulmonale, history of PE, gout, OSA, CKD 3B, alcohol use presenting with worsening fever and weakness.  Patient was admitted 358 3-11 with septic shock and pneumonia as well as respiratory failure.  Improved with treatment, initially in the ICU overnight but was weaned off pressor support.  Discharged on additional 5 days of antibiotics to be completed on 3/16.  Now reporting 2 days of malaise, fever, chills.  EMS called today and patient was hypoxic and his oxygen was increased to 5 L with improvement.  Also received Tylenol and 400 cc IV fluid with symptomatic improvement.  Denies chest pain, abdominal pain, nausea, vomiting, diarrhea  ED Course: Vital signs in ED notable for blood pressure in the 90s to 110 systolic, requiring 4 L to maintain saturations.  Lab workup included CMP with chloride 97, bicarb 33, creatinine stable 1.8, glucose 128.  CBC with hemoglobin stable at 11.8, leukocytosis of 12.2.  PT and INR stably elevated at 18.7 and 1.5 respectively.  Lactic acid initially elevated to 2.7, repeat pending.  Respiratory panel for flu COVID and RSV negative.  Urinalysis with glucose only.  Blood culture pending.  Chest x-ray with changes consistent with COPD and pulmonary hypertension.  Also noted was patchy left-sided opacities consistent with pneumonia, somewhat similar to previous.  Patient received vancomycin, cefepime, midodrine, 1 L IV fluids in the ED.  Review of Systems: As per HPI otherwise all other systems reviewed and are negative.  Past Medical History:  Diagnosis Date    Arthritis    BPH (benign prostatic hyperplasia)    CHF (congestive heart failure) (HCC)    Chronic hiccups    Community acquired bilateral lower lobe pneumonia 10/10/2023   COPD (chronic obstructive pulmonary disease) (HCC)    COVID-19 virus infection 05/20/2022   Ear stuffiness, bilateral 05/09/2021   Folliculitis    posterior scalp per office visit note of Dr Selena Batten 07/20/2014     GERD (gastroesophageal reflux disease)    Gout    Hypertension    Hypotension    Imaging of gastrointestinal tract abnormal 07/29/2022   NSTEMI (non-ST elevated myocardial infarction) (HCC) 03/19/2022   PE (pulmonary thromboembolism) (HCC)    Phlebitis    right arm  at least 20 years ago    Pneumonia    hx of pneumonia as a child    Pulmonary embolism (HCC) 03/17/2022   Pulmonary hypertension (HCC)    Septic shock (HCC) 10/25/2023   Severe sepsis (HCC) 03/16/2022   Sleep apnea     Past Surgical History:  Procedure Laterality Date   BIOPSY  11/03/2020   Procedure: BIOPSY;  Surgeon: Jeani Hawking, MD;  Location: [facility];  Service: Endoscopy;;   BIOPSY  07/06/2021   Procedure: BIOPSY;  Surgeon: Jeani Hawking, MD;  Location: [facility];  Service: Endoscopy;;   bone removed from little toe right foot      CHOLECYSTECTOMY     ESOPHAGOGASTRODUODENOSCOPY (EGD) WITH PROPOFOL N/A 11/03/2020   Procedure: ESOPHAGOGASTRODUODENOSCOPY (EGD) WITH PROPOFOL;  Surgeon: Jeani Hawking, MD;  Location: [facility];  Service: Endoscopy;  Laterality: N/A;   ESOPHAGOGASTRODUODENOSCOPY (EGD) WITH PROPOFOL N/A 07/06/2021   Procedure:  ESOPHAGOGASTRODUODENOSCOPY (EGD) WITH PROPOFOL;  Surgeon: Jeani Hawking, MD;  Location: [facility];  Service: Endoscopy;  Laterality: N/A;   pilonidal cyst removal      RIGHT HEART CATH N/A 12/13/2020   Procedure: RIGHT HEART CATH;  Surgeon: Laurey Morale, MD;  Location: [facility] INVASIVE CV LAB;  Service: Cardiovascular;  Laterality: N/A;   RIGHT/LEFT HEART CATH AND CORONARY ANGIOGRAPHY N/A  03/21/2022   Procedure: RIGHT/LEFT HEART CATH AND CORONARY ANGIOGRAPHY;  Surgeon: Laurey Morale, MD;  Location: [facility] INVASIVE CV LAB;  Service: Cardiovascular;  Laterality: N/A;   TOTAL KNEE ARTHROPLASTY Left 09/13/2014   Procedure: LEFT TOTAL KNEE ARTHROPLASTY;  Surgeon: Shelda Pal, MD;  Location: [facility];  Service: Orthopedics;  Laterality: Left;    Social History  reports that he quit smoking about 17 years ago. His smoking use included cigarettes. He started smoking about 69 years ago. He has a 104 pack-year smoking history. He has never used smokeless tobacco. He reports that he does not currently use alcohol after a past usage of about 6.0 standard drinks of alcohol per week. He reports that he does not use drugs.  Allergies  Allergen Reactions   Other Swelling    Beer- Swelling    Sunflower Oil Swelling   Sulfa Antibiotics Rash    Family History  Problem Relation Age of Onset   Heart attack Brother 32  Reviewed on admission  Prior to Admission medications   Medication Sig Start Date End Date Taking? Authorizing Provider  ADEMPAS 2 MG TABS TAKE 1 TABLET BY MOUTH 3 TIMES A DAY AS DIRECTED. DO NOT HANDLE IF PREGNANT 08/26/23   Laurey Morale, MD  albuterol (VENTOLIN HFA) 108 (90 Base) MCG/ACT inhaler Inhale 1 puff into the lungs every 4 (four) hours as needed for shortness of breath.    [provider]  allopurinol (ZYLOPRIM) 100 MG tablet Take 100 mg by mouth every morning.    [provider]  atorvastatin (LIPITOR) 40 MG tablet TAKE 1 TABLET BY MOUTH EVERY DAY 06/30/23   Milford, Anderson Malta, FNP  azelastine (ASTELIN) 0.1 % nasal spray Place 2 sprays into both nostrils 2 (two) times daily as needed for rhinitis. Use in each nostril as directed    [provider]  Cinnamon 500 MG capsule Take 500 mg by mouth every morning.    [provider]  dapagliflozin propanediol (FARXIGA) 10 MG TABS tablet Take 1 tablet (10 mg total) by mouth daily before  breakfast. 02/25/23   Milford, Anderson Malta, FNP  ELIQUIS 5 MG TABS tablet TAKE 1 TABLET BY MOUTH TWICE A DAY 07/21/23   Laurey Morale, MD  finasteride (PROSCAR) 5 MG tablet Take 5 mg by mouth at bedtime.    [provider]  gabapentin (NEURONTIN) 300 MG capsule Take 300 mg by mouth 3 (three) times daily. 04/04/22   [provider]  magnesium oxide (MAG-OX) 400 (240 Mg) MG tablet TAKE 1 TABLET BY MOUTH EVERY DAY 09/29/23   Laurey Morale, MD  metoCLOPramide (REGLAN) 5 MG tablet Take 5 mg by mouth 3 (three) times daily before meals.    [provider]  midodrine (PROAMATINE) 5 MG tablet Take 3 tablets (15 mg total) by mouth 3 (three) times daily with meals. 10/31/23   Jacklynn Ganong, FNP  Multiple Vitamin (MULTIVITAMIN WITH MINERALS) TABS tablet Take 1 tablet by mouth daily. 05/01/21   Marguerita Merles Latif, DO  OXYGEN Inhale 3 L into the lungs continuous.  [provider]  pantoprazole (PROTONIX) 40 MG tablet Take 1 tablet (40 mg total) by mouth every morning. 03/07/23   Cobb, Ruby Cola, NP  potassium chloride (KLOR-CON) 10 MEQ tablet Take 10 mEq by mouth 2 (two) times daily.    [provider]  Tiotropium Bromide-Olodaterol (STIOLTO RESPIMAT) 2.5-2.5 MCG/ACT AERS Inhale 2 puffs into the lungs daily. Patient taking differently: Inhale 1 puff into the lungs daily. 08/04/23   Oretha Milch, MD  torsemide (DEMADEX) 20 MG tablet Take 20 mg by mouth daily.    [provider]    Physical Exam: Vitals:   11/13/23 1427 11/13/23 1504 11/13/23 1515 11/13/23 1600  BP: (!) 93/45  106/61 (!) 115/53  Pulse: 92  81 80  Resp: 19  18 15   Temp: 99.9 F (37.7 C)     SpO2: 92%  99% 97%  Weight:  78.9 kg    Height:  6\' 3"  (1.905 m)      Physical Exam Constitutional:      General: He is not in acute distress.    Appearance: Normal appearance.  HENT:     Head: Normocephalic and atraumatic.     Mouth/Throat:     Mouth: Mucous membranes are moist.      Pharynx: Oropharynx is clear.  Eyes:     Extraocular Movements: Extraocular movements intact.     Pupils: Pupils are equal, round, and reactive to light.  Cardiovascular:     Rate and Rhythm: Normal rate and regular rhythm.     Pulses: Normal pulses.     Heart sounds: Normal heart sounds.  Pulmonary:     Effort: Pulmonary effort is normal. No respiratory distress.     Breath sounds: Normal breath sounds.  Abdominal:     General: Bowel sounds are normal. There is no distension.     Palpations: Abdomen is soft.     Tenderness: There is no abdominal tenderness.  Musculoskeletal:        General: No swelling or deformity.  Skin:    General: Skin is warm and dry.  Neurological:     General: No focal deficit present.     Mental Status: Mental status is at baseline.    Labs on Admission: I have personally reviewed following labs and imaging studies  CBC: Recent Labs  Lab 11/13/23 1448  WBC 12.2*  NEUTROABS 10.7*  HGB 11.8*  HCT 38.3*  MCV 88.9  PLT 274    Basic Metabolic Panel: Recent Labs  Lab 11/07/23 1147 11/13/23 1448  NA 141 140  K 3.5 4.7  CL 95* 97*  CO2 37* 33*  GLUCOSE 105* 128*  BUN 26* 23  CREATININE 1.84* 1.80*  CALCIUM 10.0 10.0    GFR: Estimated Creatinine Clearance: 33.5 mL/min (A) (by C-G formula based on SCr of 1.8 mg/dL (H)).  Liver Function Tests: Recent Labs  Lab 11/13/23 1448  AST 40  ALT 18  ALKPHOS 81  BILITOT 1.2  PROT 7.5  ALBUMIN 3.5    Urine analysis:    Component Value Date/Time   COLORURINE YELLOW 11/13/2023 1448   APPEARANCEUR CLEAR 11/13/2023 1448   LABSPEC 1.009 11/13/2023 1448   PHURINE 7.0 11/13/2023 1448   GLUCOSEU >=500 (A) 11/13/2023 1448   HGBUR NEGATIVE 11/13/2023 1448   BILIRUBINUR NEGATIVE 11/13/2023 1448   KETONESUR NEGATIVE 11/13/2023 1448   PROTEINUR NEGATIVE 11/13/2023 1448   UROBILINOGEN 0.2 09/08/2014 0927   NITRITE NEGATIVE 11/13/2023 1448   LEUKOCYTESUR NEGATIVE 11/13/2023 1448  Radiological Exams on Admission: DG Chest Port 1 View Result Date: 11/13/2023 CLINICAL DATA:  Questionable sepsis - evaluate for abnormality EXAM: PORTABLE CHEST 1 VIEW COMPARISON:  Chest x-ray 11/02/2023.  Chest CT 04/25/2023 FINDINGS: There is hyperinflation of the lungs compatible with COPD. Heart and mediastinal contours are within normal limits. Prominence of the central pulmonary vessels could reflect pulmonary arterial hypertension as described on prior CT. Focal patchy left mid and lower lung airspace opacities are similar to prior study and could reflect pneumonia. No confluent opacity on the right. No effusions or acute bony abnormality. IMPRESSION: COPD. Prominent pulmonary vessels likely reflects pulmonary arterial hypertension. Patchy left mid and lower lung airspace opacities concerning for pneumonia, similar to prior study. Electronically Signed   By: Charlett Nose M.D.   On: 11/13/2023 14:47   EKG: Independently reviewed.  Sinus rhythm at 90 bpm.  Baseline artifact.  Baseline wander in V5, V6.  Right bundle blanch block.  Similar to previous.  Assessment/Plan Principal Problem:   HCAP (healthcare-associated pneumonia) Active Problems:   Hypertension   Hypotension   Stage 3b chronic kidney disease (CKD) (HCC) - baseline SCr 1.5-1.8   DMII (diabetes mellitus, type 2) (HCC)   Pulmonary hypertension (HCC)   OSA (obstructive sleep apnea)   Gout   Acute on chronic respiratory failure with hypoxia (HCC)   Pure hypercholesterolemia   Chronic diastolic CHF (congestive heart failure) (HCC)   Gastro-esophageal reflux disease with esophagitis, without bleeding   Chronic obstructive pulmonary disease with acute lower respiratory infection (HCC)   CTEPH (chronic thromboembolic pulmonary hypertension) (HCC)   Chronic cor pulmonale (HCC)   Acute on chronic respiratory failure with hypoxia HCAP > Patient presenting with 2 days of fever, weakness, chills.  Evidence of pneumonia on chest  x-ray with leukocytosis. > Requiring 4 to 5 L to maintain saturations which is increased from his baseline 2 L. > Recent pneumonia earlier this month with initial admission for associated sepsis and respiratory failure which improved with treatment.  Discharged with p.o. antibiotics which she completed on 3/16. > Patient received midodrine for low normal blood pressure, 1 L IV fluids, vancomycin and cefepime in the ED for HCAP coverage. - Monitor on progressive unit overnight considering history of septic shock - Continue home midodrine - Continue with vancomycin and cefepime for now - Strep and Legionella urinary antigen - Trend fever curve and WBC - Follow-up blood cultures - Trend lactic acid - Supportive care  History of PE CTEPH Pulmonary hypertension Chronic cor pulmonale - Continue home Adempas - Continue home Eliquis  Chronic diastolic CHF > Last echo was in February of this year with EF 60-65%, G1 DD, pulmonary hypertension. - Holding torsemide for now with low normal blood pressure and developing infection  Hypertension -Continue home midodrine - Has received IV fluids as above  Chronic urinary retention - Continue home finasteride - May continue to self cath  Hyperlipidemia - Continue home atorvastatin  GERD - Continue home PPI  Diabetes - SSI  COPD - Replace home Stiolto with formulary Brovana and Incruse - As needed albuterol  CKD 3B > Creatinine stable 1.8 - Trend renal function and electrolytes  OSA - Not on CPAP  Gout - Continue home allopurinol   DVT prophylaxis: Eliquis Code Status:   DNR/DNI Family Communication:  None on admission  Disposition Plan:   Patient is from:  Home  Anticipated DC to:  Home  Anticipated DC date:  1 to 5 days  Anticipated DC barriers: None  Consults called:  None Admission status:  Observation, progressive  Severity of Illness: The appropriate patient status for this patient is OBSERVATION. Observation  status is judged to be reasonable and necessary in order to provide the required intensity of service to ensure the patient's safety. The patient's presenting symptoms, physical exam findings, and initial radiographic and laboratory data in the context of their medical condition is felt to place them at decreased risk for further clinical deterioration. Furthermore, it is anticipated that the patient will be medically stable for discharge from the hospital within 2 midnights of admission.    Synetta Fail MD [facility]  How to contact the [facility] Attending or Consulting provider 7A - 7P or covering provider during after hours 7P -7A, for this patient?   Check the care team in [facility] and look for a) attending/consulting [facility] provider listed and b) the [facility] team listed Log into [email or web address] and use [place]'s universal password to access. If you do not have the password, please contact the hospital operator. Locate the [facility] provider you are looking for under [facility] and page to a number that you can be directly reached. If you still have difficulty reaching the provider, please page the [facility] (Director on Call) for the Hospitalists listed on amion for assistance.  11/13/2023, 5:09 PM

## 2023-11-14 DIAGNOSIS — A419 Sepsis, unspecified organism: Secondary | ICD-10-CM | POA: Diagnosis not present

## 2023-11-14 DIAGNOSIS — I700 Atherosclerosis of aorta: Secondary | ICD-10-CM | POA: Diagnosis present

## 2023-11-14 DIAGNOSIS — Z79899 Other long term (current) drug therapy: Secondary | ICD-10-CM | POA: Diagnosis not present

## 2023-11-14 DIAGNOSIS — I2724 Chronic thromboembolic pulmonary hypertension: Secondary | ICD-10-CM | POA: Diagnosis present

## 2023-11-14 DIAGNOSIS — E872 Acidosis, unspecified: Secondary | ICD-10-CM | POA: Diagnosis present

## 2023-11-14 DIAGNOSIS — I2721 Secondary pulmonary arterial hypertension: Secondary | ICD-10-CM | POA: Diagnosis present

## 2023-11-14 DIAGNOSIS — J9811 Atelectasis: Secondary | ICD-10-CM | POA: Diagnosis present

## 2023-11-14 DIAGNOSIS — Z96652 Presence of left artificial knee joint: Secondary | ICD-10-CM | POA: Diagnosis present

## 2023-11-14 DIAGNOSIS — J439 Emphysema, unspecified: Secondary | ICD-10-CM | POA: Diagnosis present

## 2023-11-14 DIAGNOSIS — I2781 Cor pulmonale (chronic): Secondary | ICD-10-CM | POA: Diagnosis present

## 2023-11-14 DIAGNOSIS — I130 Hypertensive heart and chronic kidney disease with heart failure and stage 1 through stage 4 chronic kidney disease, or unspecified chronic kidney disease: Secondary | ICD-10-CM | POA: Diagnosis present

## 2023-11-14 DIAGNOSIS — J432 Centrilobular emphysema: Secondary | ICD-10-CM | POA: Diagnosis not present

## 2023-11-14 DIAGNOSIS — Y95 Nosocomial condition: Secondary | ICD-10-CM | POA: Diagnosis present

## 2023-11-14 DIAGNOSIS — M109 Gout, unspecified: Secondary | ICD-10-CM | POA: Diagnosis present

## 2023-11-14 DIAGNOSIS — I272 Pulmonary hypertension, unspecified: Secondary | ICD-10-CM | POA: Diagnosis not present

## 2023-11-14 DIAGNOSIS — Z9981 Dependence on supplemental oxygen: Secondary | ICD-10-CM | POA: Diagnosis not present

## 2023-11-14 DIAGNOSIS — N1832 Chronic kidney disease, stage 3b: Secondary | ICD-10-CM | POA: Diagnosis present

## 2023-11-14 DIAGNOSIS — Z87891 Personal history of nicotine dependence: Secondary | ICD-10-CM | POA: Diagnosis not present

## 2023-11-14 DIAGNOSIS — J440 Chronic obstructive pulmonary disease with acute lower respiratory infection: Secondary | ICD-10-CM | POA: Diagnosis present

## 2023-11-14 DIAGNOSIS — E7800 Pure hypercholesterolemia, unspecified: Secondary | ICD-10-CM | POA: Diagnosis present

## 2023-11-14 DIAGNOSIS — I9589 Other hypotension: Secondary | ICD-10-CM

## 2023-11-14 DIAGNOSIS — Z66 Do not resuscitate: Secondary | ICD-10-CM | POA: Diagnosis present

## 2023-11-14 DIAGNOSIS — J9621 Acute and chronic respiratory failure with hypoxia: Secondary | ICD-10-CM | POA: Diagnosis present

## 2023-11-14 DIAGNOSIS — I5082 Biventricular heart failure: Secondary | ICD-10-CM | POA: Diagnosis present

## 2023-11-14 DIAGNOSIS — I5032 Chronic diastolic (congestive) heart failure: Secondary | ICD-10-CM | POA: Diagnosis present

## 2023-11-14 DIAGNOSIS — E1100 Type 2 diabetes mellitus with hyperosmolarity without nonketotic hyperglycemic-hyperosmolar coma (NKHHC): Secondary | ICD-10-CM | POA: Diagnosis not present

## 2023-11-14 DIAGNOSIS — J189 Pneumonia, unspecified organism: Secondary | ICD-10-CM | POA: Diagnosis present

## 2023-11-14 DIAGNOSIS — Z8616 Personal history of COVID-19: Secondary | ICD-10-CM | POA: Diagnosis not present

## 2023-11-14 DIAGNOSIS — I5033 Acute on chronic diastolic (congestive) heart failure: Secondary | ICD-10-CM | POA: Diagnosis not present

## 2023-11-14 DIAGNOSIS — E1122 Type 2 diabetes mellitus with diabetic chronic kidney disease: Secondary | ICD-10-CM | POA: Diagnosis present

## 2023-11-14 DIAGNOSIS — N179 Acute kidney failure, unspecified: Secondary | ICD-10-CM | POA: Diagnosis not present

## 2023-11-14 DIAGNOSIS — E861 Hypovolemia: Secondary | ICD-10-CM | POA: Diagnosis not present

## 2023-11-14 LAB — CBC
HCT: 34.2 % — ABNORMAL LOW (ref 39.0–52.0)
Hemoglobin: 10.7 g/dL — ABNORMAL LOW (ref 13.0–17.0)
MCH: 27.3 pg (ref 26.0–34.0)
MCHC: 31.3 g/dL (ref 30.0–36.0)
MCV: 87.2 fL (ref 80.0–100.0)
Platelets: 204 10*3/uL (ref 150–400)
RBC: 3.92 MIL/uL — ABNORMAL LOW (ref 4.22–5.81)
RDW: 17.6 % — ABNORMAL HIGH (ref 11.5–15.5)
WBC: 8.1 10*3/uL (ref 4.0–10.5)
nRBC: 0 % (ref 0.0–0.2)

## 2023-11-14 LAB — COMPREHENSIVE METABOLIC PANEL WITH GFR
ALT: 10 U/L (ref 0–44)
AST: 16 U/L (ref 15–41)
Albumin: 2.7 g/dL — ABNORMAL LOW (ref 3.5–5.0)
Alkaline Phosphatase: 51 U/L (ref 38–126)
Anion gap: 4 — ABNORMAL LOW (ref 5–15)
BUN: 22 mg/dL (ref 8–23)
CO2: 32 mmol/L (ref 22–32)
Calcium: 9 mg/dL (ref 8.9–10.3)
Chloride: 103 mmol/L (ref 98–111)
Creatinine, Ser: 1.42 mg/dL — ABNORMAL HIGH (ref 0.61–1.24)
GFR, Estimated: 48 mL/min — ABNORMAL LOW (ref >60)
Glucose, Bld: 83 mg/dL (ref 70–99)
Potassium: 3.9 mmol/L (ref 3.5–5.1)
Sodium: 139 mmol/L (ref 135–145)
Total Bilirubin: 0.8 mg/dL (ref 0.0–1.2)
Total Protein: 5.9 g/dL — ABNORMAL LOW (ref 6.5–8.1)

## 2023-11-14 LAB — MRSA NEXT GEN BY PCR, NASAL: MRSA by PCR Next Gen: NOT DETECTED

## 2023-11-14 LAB — PROCALCITONIN: Procalcitonin: 0.42 ng/mL

## 2023-11-14 LAB — GLUCOSE, CAPILLARY
Glucose-Capillary: 93 mg/dL (ref 70–99)
Glucose-Capillary: 92 mg/dL (ref 70–99)

## 2023-11-14 LAB — STREP PNEUMONIAE URINARY ANTIGEN: Strep Pneumo Urinary Antigen: NEGATIVE

## 2023-11-14 MED ORDER — VANCOMYCIN HCL 1250 MG/250ML IV SOLN
1250.0000 mg | INTRAVENOUS | Status: AC
  Administered 2023-11-14 – 2023-11-17 (×4): 1250 mg via INTRAVENOUS
  Filled 2023-11-14 (×4): qty 250

## 2023-11-14 MED ORDER — DAPAGLIFLOZIN PROPANEDIOL 10 MG PO TABS
10.0000 mg | ORAL_TABLET | Freq: Every day | ORAL | Status: DC
  Administered 2023-11-15 – 2023-11-17 (×3): 10 mg via ORAL
  Filled 2023-11-14 (×3): qty 1

## 2023-11-14 MED ORDER — ORAL CARE MOUTH RINSE: 15.0000 mL | OROMUCOSAL | Status: DC | PRN

## 2023-11-14 MED ORDER — SODIUM CHLORIDE 0.9 % IV SOLN
2.0000 g | Freq: Two times a day (BID) | INTRAVENOUS | Status: AC
  Administered 2023-11-15 – 2023-11-17 (×6): 2 g via INTRAVENOUS
  Filled 2023-11-14 (×6): qty 12.5

## 2023-11-14 NOTE — Progress Notes (Signed)
 Mobility Specialist Progress Note;   11/14/23 1340  Mobility  Activity Ambulated with assistance in hallway  Level of Assistance Standby assist, set-up cues, supervision of patient - no hands on  Assistive Device Front wheel walker  Distance Ambulated (ft) 400 ft  Activity Response Tolerated well  Mobility Referral Yes  Mobility visit 1 Mobility  Mobility Specialist Start Time (ACUTE ONLY) 1340  Mobility Specialist Stop Time (ACUTE ONLY) 1350  Mobility Specialist Time Calculation (min) (ACUTE ONLY) 10 min   Pt agreeable to mobility. On 3LO2 upon arrival. Required no physical assistance during ambulation, SV. Ambulated on 3LO2, VSS throughout w/ no c/o SOB. Pt returned back to chair with all needs met, alarm on.   Caesar Bookman Mobility Specialist Please contact via SecureChat or [employer] [phone]

## 2023-11-14 NOTE — Consult Note (Addendum)
 Cardiology Consultation   Patient ID: Joshua [name] MRN: [id number]; DOB: 1936-04-24  Admit date: 11/13/2023 Date of Consult: 11/14/2023  PCP:  [name], DO   [facility] Providers Cardiologist:  Marca Ancona, MD     Patient Profile:   Joshua [name] is a 88 y.o. male with a hx of chronic diastolic heart failure, pulmonary HTN/cor pulmonale, history of PE, chronic hypotension on midodrine, HLD, OSA, CKD stage IIIb who is being seen 11/14/2023 for the evaluation of CHF at the request of Dr. Isidoro Donning.  History of Present Illness:   Mr. Joshua [name] is an 88 year old male with above medical history who is followed by Dr. Shirlee Latch.   Patient previously had PE on 2016 and was started on apixaban. He has been followed by cardiology for chronic diastolic heart failure. Echocardiogram 09/2020 showed EF 55-60%, mild LVH, severely reduced RV systolic function, moderately elevated PA systolic pressure, severely dilated RA. At that time, felt that his RV dysfunction and pulmonary HTN was related to his COPD and diastolic dysfunction. Treated with torsemide. HE underwent RHC in 11/2020 that showed moderate pulmonary arterial HTN, felt to be group 3 (COPD) vs possible group 4 (CTEPH). VQ scan in 12/2020 did show a single acute or chronic PE in the right middle lobe.   Patient admitted in 03/2021 with a CHF exacerbation. Was aggressively diuresed with lasix and metolazone. Started Marcelline Deist. Also had hypotension requiring increased doses of midodrine. Was readmitted in 04/2021 with shock (mixed septic and cardiogenic), due to aspiration pneumonia and CHF. Also suspected to have ileus/partial SBO. Was seen by general surgery, not a surgical candidate. Initially, diuretics were held due to over-diuresis and shock. Eventually weaned off pressors and restarted on torsemide. Cardiac monitor in 07/2021 showed predominantly NSR, rare PVCs, 1 run of VT lasting 19 seconds.   Patient admitted in 03/2022 with septic  shock due to aspiration pneumonia and possible colitis. Echocardiogram 03/18/22 showed LVEF 60-65%, severely reduced RV systolic function with moderately elevated PA systolic pressure, trivial MR. He underwent R/L heart cath on 03/21/22 that showed mild nonobstructive CAD, mild pulmonary arterial HTN. He was again admitted in 06/2022 with acute on chronic respiratory failure and sepsis, secondary to PNA vs UTI. Treated with IV antibiotics.   Echocardiogram as an outpatient on 01/09/23 showed EF 60-65%, no regional wall motion abnormalities, grade I DD, moderately reduced RV systolic function, mildly elevated PA systolic pressure.  Admitted in 02/2023 with RLL pneumonia and COPD exacerbation.   Patient has had multiple admissions so far in 2025. He was admitted 2/21-10/20/23 for septic shock, bilateral pneumonia. He had presented with fever, leukocytosis, hypotension requiring pressor support. Was admitted to the ICU. Treated with IV antibiotics and completed a course of prednisone. He underwent echocardiogram 10/11/23 that showed EF 60-65%, no regional wall motion abnormalities, grade I DD, severely reduced RV systolic function, severely elevated PA systolic pressure, mild-moderate TR. Discharged on 3/3.   Again admitted 3/8-3/11 after he presented to the ED complaining of chills, nausea, generalized weakness. Found to be febrile and tachypneic, required 10 L high flow oxygen via [place]. Hypotensive requiring pressors. Found to have left lower lobe pneumonia. Treated with broad-spectrum IV antibiotics. Underwent modified barium swallow that did not suggest any evidence of aspiration. Advanced heart failure was consulted as patient was mildly hypervolemic. Treated with IV lasix. Overall, there were no changes to cardiac medications. Completed course of antibiotics on 3/16.   Patient presented to the ED on 3/27 complaining  of fever, chills, weakness. Initial vital signs in the ED showed temp 99.54F, HR 92 BPM, BP 93/45,  oxygen 92% on 4L via [place]. Was given tylenol and IV fluids by EMS with improvement in symptoms. CXR showed patchy left mid and lower lung airspace opacities concerning for PNA. WBC 12.2, hemoglobin 11.9, platelets 274. Creatinine 1.80. COVID, flu, RSV negative. UA clean. Initial lactic acid 2.7, improved to 0.8. In the ED, patient received 1 L IV fluids, vancomycin, cefepime, midodrine. Was admitted to the internal medicine service for HCAP, acute on chronic respiratory failure.   On interview, patient reports feeling well this AM. He came to the ED because of chills and body aches. He denies having worsening shortness of breath, and states that his breathing is not bothering him very much today. He has a productive cough. Denies orthopnea, chest pain, dizziness, syncope, near syncope. Denies lower extremity swelling, nausea, vomiting, diarrhea.   Past Medical History:  Diagnosis Date   Arthritis    BPH (benign prostatic hyperplasia)    CHF (congestive heart failure) (HCC)    Chronic hiccups    Community acquired bilateral lower lobe pneumonia 10/10/2023   COPD (chronic obstructive pulmonary disease) (HCC)    COVID-19 virus infection 05/20/2022   Ear stuffiness, bilateral 05/09/2021   Folliculitis    posterior scalp per office visit note of Dr Selena Batten 07/20/2014     GERD (gastroesophageal reflux disease)    Gout    Hypertension    Hypotension    Imaging of gastrointestinal tract abnormal 07/29/2022   NSTEMI (non-ST elevated myocardial infarction) (HCC) 03/19/2022   PE (pulmonary thromboembolism) (HCC)    Phlebitis    right arm  at least 20 years ago    Pneumonia    hx of pneumonia as a child    Pulmonary embolism (HCC) 03/17/2022   Pulmonary hypertension (HCC)    Septic shock (HCC) 10/25/2023   Severe sepsis (HCC) 03/16/2022   Sleep apnea     Past Surgical History:  Procedure Laterality Date   BIOPSY  11/03/2020   Procedure: BIOPSY;  Surgeon: Jeani Hawking, MD;  Location: [facility];   Service: Endoscopy;;   BIOPSY  07/06/2021   Procedure: BIOPSY;  Surgeon: Jeani Hawking, MD;  Location: [facility];  Service: Endoscopy;;   bone removed from little toe right foot      CHOLECYSTECTOMY     ESOPHAGOGASTRODUODENOSCOPY (EGD) WITH PROPOFOL N/A 11/03/2020   Procedure: ESOPHAGOGASTRODUODENOSCOPY (EGD) WITH PROPOFOL;  Surgeon: Jeani Hawking, MD;  Location: [facility];  Service: Endoscopy;  Laterality: N/A;   ESOPHAGOGASTRODUODENOSCOPY (EGD) WITH PROPOFOL N/A 07/06/2021   Procedure: ESOPHAGOGASTRODUODENOSCOPY (EGD) WITH PROPOFOL;  Surgeon: Jeani Hawking, MD;  Location: [facility];  Service: Endoscopy;  Laterality: N/A;   pilonidal cyst removal      RIGHT HEART CATH N/A 12/13/2020   Procedure: RIGHT HEART CATH;  Surgeon: Laurey Morale, MD;  Location: [facility] INVASIVE CV LAB;  Service: Cardiovascular;  Laterality: N/A;   RIGHT/LEFT HEART CATH AND CORONARY ANGIOGRAPHY N/A 03/21/2022   Procedure: RIGHT/LEFT HEART CATH AND CORONARY ANGIOGRAPHY;  Surgeon: Laurey Morale, MD;  Location: [facility] INVASIVE CV LAB;  Service: Cardiovascular;  Laterality: N/A;   TOTAL KNEE ARTHROPLASTY Left 09/13/2014   Procedure: LEFT TOTAL KNEE ARTHROPLASTY;  Surgeon: Shelda Pal, MD;  Location: [facility];  Service: Orthopedics;  Laterality: Left;     Home Medications:  Prior to Admission medications   Medication Sig Start Date End Date Taking? Authorizing Provider  ADEMPAS 2 MG TABS  TAKE 1 TABLET BY MOUTH 3 TIMES A DAY AS DIRECTED. DO NOT HANDLE IF PREGNANT 08/26/23  Yes Laurey Morale, MD  albuterol (VENTOLIN HFA) 108 (90 Base) MCG/ACT inhaler Inhale 1 puff into the lungs every 4 (four) hours as needed for shortness of breath.   Yes [provider]  allopurinol (ZYLOPRIM) 100 MG tablet Take 100 mg by mouth every morning.   Yes [provider]  atorvastatin (LIPITOR) 40 MG tablet TAKE 1 TABLET BY MOUTH EVERY DAY 06/30/23  Yes [place], [place], FNP  azelastine (ASTELIN) 0.1 % nasal spray Place 2  sprays into both nostrils 2 (two) times daily as needed for rhinitis. Use in each nostril as directed   Yes [provider]  CINNAMON PO Take 1 capsule by mouth daily.   Yes [provider]  dapagliflozin propanediol (FARXIGA) 10 MG TABS tablet Take 1 tablet (10 mg total) by mouth daily before breakfast. 02/25/23  Yes Milford, Anderson [place], FNP  ELIQUIS 5 MG TABS tablet TAKE 1 TABLET BY MOUTH TWICE A DAY 07/21/23  Yes Laurey Morale, MD  finasteride (PROSCAR) 5 MG tablet Take 5 mg by mouth daily.   Yes [provider]  gabapentin (NEURONTIN) 300 MG capsule Take 300 mg by mouth 3 (three) times daily. 04/04/22  Yes [provider]  magnesium oxide (MAG-OX) 400 (240 Mg) MG tablet TAKE 1 TABLET BY MOUTH EVERY DAY 09/29/23  Yes Laurey Morale, MD  metoCLOPramide (REGLAN) 5 MG tablet Take 5 mg by mouth 3 (three) times daily with meals as needed for nausea or vomiting.   Yes [provider]  midodrine (PROAMATINE) 5 MG tablet Take 3 tablets (15 mg total) by mouth 3 (three) times daily with meals. 10/31/23  Yes Milford, Anderson [place], FNP  Multiple Vitamin (MULTIVITAMIN WITH MINERALS) TABS tablet Take 1 tablet by mouth daily. 05/01/21  Yes Sheikh, Omair Latif, DO  OXYGEN Inhale 2 L/min into the lungs continuous.   Yes [provider]  pantoprazole (PROTONIX) 40 MG tablet Take 1 tablet (40 mg total) by mouth every morning. 03/07/23  Yes Cobb, Ruby Cola, NP  potassium chloride (KLOR-CON) 10 MEQ tablet Take 10 mEq by mouth 2 (two) times daily.   Yes [provider]  Tiotropium Bromide-Olodaterol (STIOLTO RESPIMAT) 2.5-2.5 MCG/ACT AERS Inhale 2 puffs into the lungs daily. 08/04/23  Yes Oretha Milch, MD  torsemide (DEMADEX) 10 MG tablet Take 10 mg by mouth daily.   Yes [provider]    Inpatient Medications: Scheduled Meds:  allopurinol  100 mg Oral q morning   apixaban  5 mg Oral BID   arformoterol  15 mcg Nebulization BID   And    umeclidinium bromide  1 puff Inhalation Daily   atorvastatin  40 mg Oral Daily   finasteride  5 mg Oral QHS   gabapentin  300 mg Oral TID   insulin aspart  0-5 Units Subcutaneous QHS   insulin aspart  0-9 Units Subcutaneous TID WC   midodrine  15 mg Oral TID WC   pantoprazole  40 mg Oral q morning   Riociguat  2 mg Oral TID   sodium chloride flush  3 mL Intravenous Q12H   Continuous Infusions:  ceFEPime (MAXIPIME) IV 2 g (11/14/23 7829)   vancomycin     PRN Meds: acetaminophen **OR** acetaminophen, albuterol, polyethylene glycol  Allergies:    Allergies  Allergen Reactions   Other Swelling    Beer - swelling  Sunflower Oil Swelling   Sulfa Antibiotics Rash    Social History:   Social History   Socioeconomic History   Marital status: Married    Spouse name: Joann   Number of children: 2   Years of education: 14   Highest education level: Not on file  Occupational History   Occupation: postal service,[employer] there,school crossing guard. Stopped working in  2022  Tobacco Use   Smoking status: Former    Current packs/day: 0.00    Average packs/day: 2.0 packs/day for 52.0 years (104.0 ttl pk-yrs)    Types: Cigarettes    Start date: 31    Quit date: 08/19/2006    Years since quitting: 17.2   Smokeless tobacco: Never   Tobacco comments:    Former smoke 03/15/22  Vaping Use   Vaping status: Never Used  Substance and Sexual Activity   Alcohol use: Not Currently    Alcohol/week: 6.0 standard drinks of alcohol    Types: 6 Shots of liquor per week    Comment: stopped in 2018   Drug use: No   Sexual activity: Not Currently  Other Topics Concern   Not on file  Social History Narrative   Not on file   Social Drivers of Health   Financial Resource Strain: Not on file  Food Insecurity: No Food Insecurity (11/13/2023)   Hunger Vital Sign    Worried About Running Out of Food in the Last Year: Never true    Ran Out of Food in the Last Year: Never  true  Transportation Needs: No Transportation Needs (11/13/2023)   PRAPARE - Administrator, Civil Service (Medical): No    Lack of Transportation (Non-Medical): No  Recent Concern: Transportation Needs - Unmet Transportation Needs (10/10/2023)   PRAPARE - Transportation    Lack of Transportation (Medical): No    Lack of Transportation (Non-Medical): Yes  Physical Activity: Not on file  Stress: No Stress Concern Present (05/09/2021)   [employer] of Occupational Health - Occupational Stress Questionnaire    Feeling of Stress : Only a little  Social Connections: Socially Integrated (11/13/2023)   Social Connection and Isolation Panel [NHANES]    Frequency of Communication with Friends and Family: Three times a week    Frequency of Social Gatherings with Friends and Family: Three times a week    Attends Religious Services: 1 to 4 times per year    Active Member of Clubs or Organizations: No    Attends Banker Meetings: 1 to 4 times per year    Marital Status: Married  Catering manager Violence: Not At Risk (11/13/2023)   Humiliation, Afraid, Rape, and Kick questionnaire    Fear of Current or Ex-Partner: No    Emotionally Abused: No    Physically Abused: No    Sexually Abused: No    Family History:    Family History  Problem Relation Age of Onset   Heart attack Brother 80     ROS:  Please see the history of present illness.   All other ROS reviewed and negative.     Physical Exam/Data:   Vitals:   11/14/23 0400 11/14/23 0600 11/14/23 0743 11/14/23 0747  BP:  (!) 105/57  (!) 100/52  Pulse: 66 66    Resp: 13 13  16   Temp:  97.7 F (36.5 C)  97.7 F (36.5 C)  TempSrc:  Oral  Oral  SpO2: 94% 95% 95%   Weight:  Height:        Intake/Output Summary (Last 24 hours) at 11/14/2023 0859 Last data filed at 11/14/2023 0839 Gross per 24 hour  Intake 120 ml  Output 200 ml  Net -80 ml      11/13/2023    3:04 PM 11/02/2023    4:46 PM 10/31/2023     9:30 AM  Last 3 Weights  Weight (lbs) 174 lb 178 lb 175 lb 3.2 oz  Weight (kg) 78.926 kg 80.74 kg 79.47 kg     Body mass index is 21.75 kg/m.  General:  Elderly male, chronically ill appearing. Laying flat in the bed in no acute distress  HEENT: normal Neck: no JVD Vascular: Radial pulses 2+ bilaterally Cardiac:  normal S1, S2; RRR; faint systolic murmur  Lungs:  crackles in bilateral lung bases, L>R. Normal WOB on 3 L via [place]  Abd: soft, nontender  Ext: no edema in BLE  Musculoskeletal:  No deformities, BUE and BLE strength normal and equal Skin: warm and dry  Neuro:  CNs 2-12 intact, no focal abnormalities noted Psych:  Normal affect   EKG:  The EKG was personally reviewed and demonstrates:  Sinus rhythm with PAC present, RBBB and LAFB, HR 92 BPM  Telemetry:  Telemetry was personally reviewed and demonstrates:  NSR with occasional PVCs   Relevant CV Studies: Cardiac Studies & Procedures   ______________________________________________________________________________________________ CARDIAC CATHETERIZATION  CARDIAC CATHETERIZATION 03/21/2022  Narrative   Prox RCA lesion is 30% stenosed.   Dist RCA lesion is 30% stenosed.   Ost LAD to Prox LAD lesion is 25% stenosed.  1. Mild nonobstructive CAD. 2. Low filling pressures. 3. Mild pulmonary arterial hypertension.  Findings Coronary Findings Diagnostic  Dominance: Right  Left Anterior Descending Ost LAD to Prox LAD lesion is 25% stenosed.  Right Coronary Artery Prox RCA lesion is 30% stenosed. Dist RCA lesion is 30% stenosed.  Intervention  No interventions have been documented.   CARDIAC CATHETERIZATION  CARDIAC CATHETERIZATION 12/13/2020  Narrative 1. Normal right and left heart filling pressures. 2. Moderate pulmonary arterial hypertension. 3. Low but not markedly low cardiac output.  Based on history, most likely group 3 (COPD) vs group 4 PH (CTEPH).  Recent CTA chest did not show evidence for PE, but  would recommend V/Q scan to rule out chronic PEs given history of VTE.   STRESS TESTS  MYOCARDIAL PERFUSION IMAGING 09/24/2017  Narrative  Nuclear stress EF: 54% with overall normal but asynchronous contraction, interventricular conduction delay (right bundle branch block at baseline).  There was no ST segment deviation noted during stress.  This is a low risk study. No ischemia identified.  Donato Schultz, MD   ECHOCARDIOGRAM  ECHOCARDIOGRAM COMPLETE 10/11/2023  Narrative ECHOCARDIOGRAM REPORT    Patient Name:   ODIS TURCK Date of Exam: 10/11/2023 Medical Rec #:  [id number]        Height:       75.0 in Accession #:    [id number]       Weight:       229.9 lb Date of Birth:  [birth year]         BSA:          2.328 m Patient Age:    87 years         BP:           97/56 mmHg Patient Gender: M                HR:  90 bpm. Exam Location:  Inpatient  Procedure: 2D Echo, Cardiac Doppler and Color Doppler (Both Spectral and Color Flow Doppler were utilized during procedure).  Indications:    CHF- Acute Diastolic  History:        Patient has prior history of Echocardiogram examinations, most recent 01/09/2023. COPD; Risk Factors:Sleep Apnea and Former Smoker.  Sonographer:    Karma Ganja Referring Phys: GRACE E BOWSER  IMPRESSIONS   1. Left ventricular ejection fraction, by estimation, is 60 to 65%. The left ventricle has normal function. The left ventricle has no regional wall motion abnormalities. Left ventricular diastolic parameters are consistent with Grade I diastolic dysfunction (impaired relaxation). 2. D shaped septum suggesting cor pulmonale. Right ventricular systolic function is severely reduced. The right ventricular size is severely enlarged. There is severely elevated pulmonary artery systolic pressure. 3. Right atrial size was moderately dilated. 4. The mitral valve is normal in structure. No evidence of mitral valve regurgitation. No evidence of mitral  stenosis. 5. Tricuspid valve regurgitation is mild to moderate. 6. The aortic valve is tricuspid. There is mild calcification of the aortic valve. There is mild thickening of the aortic valve. Aortic valve regurgitation is trivial. Aortic valve sclerosis is present, with no evidence of aortic valve stenosis. 7. Pulmonic valve regurgitation is moderate. 8. There is borderline dilatation of the aortic root, measuring 37 mm. 9. The inferior vena cava is dilated in size with <50% respiratory variability, suggesting right atrial pressure of 15 mmHg.  FINDINGS Left Ventricle: Left ventricular ejection fraction, by estimation, is 60 to 65%. The left ventricle has normal function. The left ventricle has no regional wall motion abnormalities. Strain imaging was not performed. The left ventricular internal cavity size was normal in size. There is no left ventricular hypertrophy. Left ventricular diastolic parameters are consistent with Grade I diastolic dysfunction (impaired relaxation).  Right Ventricle: D shaped septum suggesting cor pulmonale. The right ventricular size is severely enlarged. No increase in right ventricular wall thickness. Right ventricular systolic function is severely reduced. There is severely elevated pulmonary artery systolic pressure. The tricuspid regurgitant velocity is 3.68 m/s, and with an assumed right atrial pressure of 15 mmHg, the estimated right ventricular systolic pressure is 69.2 mmHg.  Left Atrium: Left atrial size was normal in size.  Right Atrium: Right atrial size was moderately dilated.  Pericardium: There is no evidence of pericardial effusion.  Mitral Valve: The mitral valve is normal in structure. No evidence of mitral valve regurgitation. No evidence of mitral valve stenosis.  Tricuspid Valve: The tricuspid valve is normal in structure. Tricuspid valve regurgitation is mild to moderate. No evidence of tricuspid stenosis.  Aortic Valve: The aortic valve is  tricuspid. There is mild calcification of the aortic valve. There is mild thickening of the aortic valve. Aortic valve regurgitation is trivial. Aortic valve sclerosis is present, with no evidence of aortic valve stenosis. Aortic valve mean gradient measures 3.0 mmHg. Aortic valve peak gradient measures 5.3 mmHg. Aortic valve area, by VTI measures 3.09 cm.  Pulmonic Valve: The pulmonic valve was normal in structure. Pulmonic valve regurgitation is moderate. No evidence of pulmonic stenosis.  Aorta: The aortic root is normal in size and structure. There is borderline dilatation of the aortic root, measuring 37 mm.  Venous: The inferior vena cava is dilated in size with less than 50% respiratory variability, suggesting right atrial pressure of 15 mmHg.  IAS/Shunts: No atrial level shunt detected by color flow Doppler.  Additional Comments: 3D imaging was  not performed.   LEFT VENTRICLE PLAX 2D LVIDd:         3.80 cm   Diastology LVIDs:         2.40 cm   LV e' medial:    6.31 cm/s LV PW:         1.00 cm   LV E/e' medial:  7.9 LV IVS:        0.90 cm   LV e' lateral:   5.44 cm/s LVOT diam:     2.20 cm   LV E/e' lateral: 9.2 LV SV:         61 LV SV Index:   26 LVOT Area:     3.80 cm   RIGHT VENTRICLE RV Basal diam:  6.20 cm RV S prime:     15.00 cm/s TAPSE (M-mode): 2.2 cm  LEFT ATRIUM           Index        RIGHT ATRIUM           Index LA diam:      3.20 cm 1.37 cm/m   RA Area:     28.40 cm LA Vol (A2C): 59.7 ml 25.64 ml/m  RA Volume:   97.40 ml  41.83 ml/m LA Vol (A4C): 28.8 ml 12.37 ml/m AORTIC VALVE AV Area (Vmax):    3.34 cm AV Area (Vmean):   3.05 cm AV Area (VTI):     3.09 cm AV Vmax:           115.00 cm/s AV Vmean:          77.000 cm/s AV VTI:            0.197 m AV Peak Grad:      5.3 mmHg AV Mean Grad:      3.0 mmHg LVOT Vmax:         101.00 cm/s LVOT Vmean:        61.800 cm/s LVOT VTI:          0.160 m LVOT/AV VTI ratio: 0.81  AORTA Ao Root diam: 3.70  cm  MITRAL VALVE               TRICUSPID VALVE MV Area (PHT): 3.50 cm    TR Peak grad:   54.2 mmHg MV Decel Time: 217 msec    TR Vmax:        368.00 cm/s MV E velocity: 49.80 cm/s MV A velocity: 82.30 cm/s  SHUNTS MV E/A ratio:  0.61        Systemic VTI:  0.16 m Systemic Diam: 2.20 cm  Charlton Haws MD Electronically signed by Charlton Haws MD Signature Date/Time: 10/11/2023/2:56:58 PM    Final    MONITORS  LONG TERM MONITOR-LIVE TELEMETRY (3-14 DAYS) 07/27/2021  Narrative Patch Wear Time:  3 days and 0 hours (2022-11-29T11:45:56-0500 to 2022-12-02T11:57:50-0500)  Patient had a min HR of 61 bpm, max HR of 152 bpm, and avg HR of 77 bpm. Predominant underlying rhythm was Sinus Rhythm. Bundle Branch Block/IVCD was present. 1 run of Ventricular Tachycardia occurred lasting 19.6 secs with a max rate of 152 bpm (avg 123 bpm). Isolated SVEs were rare (<1.0%), SVE Couplets were rare (<1.0%), and no SVE Triplets were present. Isolated VEs were occasional (1.3%, 4273), VE Couplets were rare (<1.0%, 75), and VE Triplets were rare (<1.0%, 18). Ventricular Bigeminy and Trigeminy were present.  Conclusion: 1. Predominantly NSR 2. Rare PVCs (1.3% beats). 3. 1 run of VT lasting 19.6 seconds.  ______________________________________________________________________________________________       Laboratory Data:  High Sensitivity Troponin:   Recent Labs  Lab 11/02/23 1741 11/02/23 2000  TROPONINIHS 34* 37*     Chemistry Recent Labs  Lab 11/07/23 1147 11/13/23 1448 11/14/23 0407  NA 141 140 139  K 3.5 4.7 3.9  CL 95* 97* 103  CO2 37* 33* 32  GLUCOSE 105* 128* 83  BUN 26* 23 22  CREATININE 1.84* 1.80* 1.42*  CALCIUM 10.0 10.0 9.0  GFRNONAA 35* 36* 48*  ANIONGAP 9 10 4*    Recent Labs  Lab 11/13/23 1448 11/14/23 0407  PROT 7.5 5.9*  ALBUMIN 3.5 2.7*  AST 40 16  ALT 18 10  ALKPHOS 81 51  BILITOT 1.2 0.8   Lipids No results for input(s): "CHOL", "TRIG", "HDL",  "LABVLDL", "LDLCALC", "CHOLHDL" in the last 168 hours.  Hematology Recent Labs  Lab 11/13/23 1448 11/14/23 0407  WBC 12.2* 8.1  RBC 4.31 3.92*  HGB 11.8* 10.7*  HCT 38.3* 34.2*  MCV 88.9 87.2  MCH 27.4 27.3  MCHC 30.8 31.3  RDW 17.8* 17.6*  PLT 274 204   Thyroid No results for input(s): "TSH", "FREET4" in the last 168 hours.  BNPNo results for input(s): "BNP", "PROBNP" in the last 168 hours.  DDimer No results for input(s): "DDIMER" in the last 168 hours.   Radiology/Studies:  DG Chest Port 1 View Result Date: 11/13/2023 CLINICAL DATA:  Questionable sepsis - evaluate for abnormality EXAM: PORTABLE CHEST 1 VIEW COMPARISON:  Chest x-ray 11/02/2023.  Chest CT 04/25/2023 FINDINGS: There is hyperinflation of the lungs compatible with COPD. Heart and mediastinal contours are within normal limits. Prominence of the central pulmonary vessels could reflect pulmonary arterial hypertension as described on prior CT. Focal patchy left mid and lower lung airspace opacities are similar to prior study and could reflect pneumonia. No confluent opacity on the right. No effusions or acute bony abnormality. IMPRESSION: COPD. Prominent pulmonary vessels likely reflects pulmonary arterial hypertension. Patchy left mid and lower lung airspace opacities concerning for pneumonia, similar to prior study. Electronically Signed   By: Charlett Nose M.D.   On: 11/13/2023 14:47     Assessment and Plan:   Chronic HFpEF  RV failure/chronic cor pulmonale  Pulmonary HTN  - Most recent echocardiogram from 09/2023 showed EF 60-65%, grade I DD, severely reduced RV systolic function, severely elevated PA systolic pressure, mild-moderate TR  - Previous R heart cath in 11/2020 confirmed pulmonary arterial HTN. Suspected mixed group 3 (COPD, OSA) and group 4 (chronic PE)  - Followed by advanced heart failure as an outpatient on riociguat, farxiga, torsemide. On midodrine for chronic hypotension as well  - Now admitted with  pneumonia. On antibiotics per primary  - Torsemide was initially held due to low BP with pneumonia. BP remains low this AM, 100/52. Patient is euvolemic on exam   - OK to hold torsemide for now. Plan to resume as BP allows, hopefully tomorrow AM  - Resume home farxiga  - Continue midodrine 15 mg TID - Continue riociguat 2 mg TID   Chronic PE - Continue eliquis 5 mg BID   Chronic Hypotension  - BP low-normal so far this admission  - Continue midodrine 15 mg TID   CKD stage IIIb - Baseline creatinine around 1.5-1.7 - Was 1.80 yesterday, improved to 1.42 today   CAD - LHC in 03/2022 noted mild nonobstructive CAD - Patient denies chest pain  - Not on ASA due to eliquis use  -  Continue lipitor 40 mg daily   RBBB, LAFB - No symptoms concerning for high-grade AV block - Patient is not on AV nodal blockers   Otherwise per primary  - Recurrent pneumonia, acute on chronic respiratory failure (admitted 2/21-10/20/23, 3/8-3/11/25 with similar) - COPD/emphysema  - Hiatal Hernia with esophagitis, GERD  - Diabetes  - OSA  - Gout   Risk Assessment/Risk Scores:   New York Heart Association (NYHA) Functional Class NYHA Class III     For questions or updates, please contact [facility] Please consult [email or web address] for contact info under    Signed, Jonita Albee, PA-C  11/14/2023 8:59 AM   Patient seen and examined.  Agree with above documentation.  Mr. Meir is an 88 year old male with a history of chronic diastolic heart failure, pulmonary hypertension, PE, OSA, CKD stage IIIb, chronic hypotension on midodrine we are consulted by Dr. Isidoro Donning for evaluation of heart failure.  Patient follows with advanced heart failure.  He has had multiple recent admissions.  He was admitted 2/21 through 10/20/2023 for septic shock due to pneumonia.  Initially required ICU admission and pressor support.  Echocardiogram 2/22 showed EF 60 to 65%, severe RV dysfunction, severe pulmonary  hypertension.  He was treated with IV antibiotics and discharged on 3/3.  He was readmitted on 3/8 through 3/11 with recurrent pneumonia.  Initially required 10 L HFNC.  He was treated with IV antibiotics.  Heart failure was consulted during the admission as patient became volume overloaded and treated with IV Lasix.  He was discharged on 3/11.  Presented back to ED yesterday with chills and weakness.  Initial vital signs notable for BP 93/45, pulse 92, SpO2 92% on room air.  Labs notable for creatinine 1.80 (stable from 1.84 on 3/21), Hemoglobin 11.8, WBC 12.2, lactate 2.7 > 0.8, procalcitonin 0.4.  EKG shows sinus rhythm, right bundle branch block, left posterior fascicular block, rate 90.  Chest x-ray showed patchy left mid and lower lung airspace opacities concerning for pneumonia.  He was admitted and started on IV vancomycin/cefepime.  He was given IV fluids in ED with improvement in creatinine, 1.4 today.  Torsemide held.  On exam, patient is alert and oriented, regular rate and rhythm, no murmurs, lungs CTAB, no LE edema or JVD.  For his chronic diastolic heart failure and RV failure, currently appears euvolemic.  Can continue to hold torsemide for now.  Will restart home [place].  BP has remained soft, continue midodrine.  Little Ishikawa, MD

## 2023-11-14 NOTE — Progress Notes (Addendum)
 Pharmacy Antibiotic Note  Joshua [name] is a 88 y.o. male admitted on 11/13/2023 and now being treated for  pneuomonia . Pharmacy has been consulted for vancomycin and cefepime dosing.   -WBC= 8.1, afebrile -SCr 1.8> 1.4 (baseline ~ 1.1)  Plan: -Change vancomycin to 1250 mg IV q24hr (estimated AUC 510) -Continue Cefepime 2gm IV q12h -MRSA PCR pending -Monitor fever, WBC, and micro data.  Height: 6\' 3"  (190.5 cm) Weight: 78.9 kg (174 lb) IBW/kg (Calculated) : 84.5  Temp (24hrs), Avg:98 F (36.7 C), Min:97.7 F (36.5 C), Max:98.6 F (37 C)  Recent Labs  Lab 11/13/23 1448 11/13/23 1516 11/13/23 1731 11/14/23 0407  WBC 12.2*  --   --  8.1  CREATININE 1.80*  --   --  1.42*  LATICACIDVEN  --  2.7* 0.8  --     Estimated Creatinine Clearance: 42.4 mL/min (A) (by C-G formula based on SCr of 1.42 mg/dL (H)).    Allergies  Allergen Reactions   Other Swelling    Beer - swelling    Sunflower Oil Swelling   Sulfa Antibiotics Rash    Antimicrobials this admission: Vancomycin 3/27 >>  Cefepime 3/27 >>   Dose adjustments this admission:  Microbiology results: 3/27 YNW:GNFA  Thank you for allowing pharmacy to be a part of this patient's care.  Harland German, PharmD Clinical Pharmacist **Pharmacist phone directory can now be found on [email or web address] ([email or web address]).  Listed under [facility].

## 2023-11-14 NOTE — Progress Notes (Signed)
 [facility]                                                                              Aneesh Faller, is a 88 y.o. male, DOB - Aug 07, 1936, BMW:[id number] Admit date - 11/13/2023    Outpatient Primary MD for the patient is [name], DO  LOS - 0  days  Chief Complaint  Patient presents with   Weakness   Fever       Brief summary   Patient is a 88 year old male with HTN, hyperlipidemia, GERD, diabetes mellitus, COPD, chronic respiratory failure on 2 L O2 via [place], chronic diastolic CHF, pulmonary hypertension, CTEPH, history of PE, gout, OSA, CKD stage IIIb, alcohol use presented with weakness and fevers.  Patient was admitted 10/25/2023-10/28/2023 with septic shock, left lower lobe pneumonia, acute on chronic respiratory failure, needed ICU and pressor support.. Patient now presented with 2 days of malaise, fevers and chills.  EMS was called and found to be hypoxic and was placed on 5 L O2 via [place].  BP was noted to be soft and received IV fluid bolus. Flu, COVID, RSV negative.  Chest x-ray consistent with COPD, pulmonary hypertension, patchy left-sided opacities consistent with pneumonia.  Patient was placed on vancomycin, cefepime, midodrine and fluids.   Assessment & Plan    Principal Problem: Acute on chronic respiratory failure with hypoxia, HCAP -Presented with 2 days of fevers, weakness, chills, chest x-ray.  PNA, initially placed on 5 L O2 in ED,  -Continue IV vancomycin, cefepime -Wean O2 as tolerated, follow urine strep antigen, urine Legionella antigen, blood cultures -Continue bronchodilators, midodrine for BP support. -Leukocytosis, lactic acidosis improving  Active problems CKD stage IIIb -Presented with creatinine of 1.8, creatinine was 1.84 on 3/21 -Received IV fluids in ED, creatinine 1.4.  Cautious use of fluids with history of CHF, right ventricular failure.  -   CTEPH with history of PE Pulmonary hypertension,  Chronic cor pulmonale with RV  failure - Continue home [place] - Continue home Eliquis, H&H stable -Patient was followed by CHF team during previous admission.   Chronic diastolic CHF with RV failure -2D echo on 10/11/2023 showed EF of 55% with severely enlarged RV, severely reduced function -Torsemide was held overnight due to hypotension -Cardiology has been consulted.  COPD -Known history of COPD with PFTs consistent with obstructive lung disease and severely reduced DLCO -Currently no acute wheezing, continue bronchodilators   Hypotension -Continue midodrine, BP currently stable -Received IV   Chronic urinary retention - Continue home finasteride - May continue to self cath   Hyperlipidemia - Continue home atorvastatin   GERD - Continue home PPI   Diabetes mellitus type II -Hemoglobin A1c was 5.5 on 10/10/2023, controlled, patient on Farxiga CBG (last 3)  Recent Labs    11/13/23 2204 11/14/23 0838  GLUCAP 89 93  Continue sliding scale insulin while inpatient    OSA - Not on CPAP   Gout - Continue home allopurinol    Estimated body mass index is 21.75 kg/m as calculated from the following:   Height as of this encounter: 6\' 3"  (1.905 m).   Weight as of this  encounter: 78.9 kg.  Code Status: DNR DVT Prophylaxis:   apixaban (ELIQUIS) tablet 5 mg   Level of Care: Level of care: Progressive Family Communication: Updated patient Disposition Plan:      Remains inpatient appropriate:      Procedures:   Consultants:   Cardiology  Antimicrobials:   Anti-infectives (From admission, onward)    Start     Dose/Rate Route Frequency Ordered Stop   11/14/23 1500  vancomycin (VANCOREADY) IVPB 1750 mg/350 mL        1,750 mg 175 mL/hr over 120 Minutes Intravenous Every 48 hours 11/13/23 1725     11/13/23 2300  ceFEPIme (MAXIPIME) 2 g in sodium chloride 0.9 % 100 mL IVPB        2 g 200 mL/hr over 30 Minutes Intravenous Every 8 hours 11/13/23 1725     11/13/23 1515  vancomycin (VANCOCIN) IVPB  1000 mg/200 mL premix        1,000 mg 200 mL/hr over 60 Minutes Intravenous  Once 11/13/23 1505 11/13/23 1700   11/13/23 1515  ceFEPIme (MAXIPIME) 2 g in sodium chloride 0.9 % 100 mL IVPB        2 g 200 mL/hr over 30 Minutes Intravenous  Once 11/13/23 1505 11/13/23 1555          Medications  allopurinol  100 mg Oral q morning   apixaban  5 mg Oral BID   arformoterol  15 mcg Nebulization BID   And   umeclidinium bromide  1 puff Inhalation Daily   atorvastatin  40 mg Oral Daily   finasteride  5 mg Oral QHS   gabapentin  300 mg Oral TID   insulin aspart  0-5 Units Subcutaneous QHS   insulin aspart  0-9 Units Subcutaneous TID WC   midodrine  15 mg Oral TID WC   pantoprazole  40 mg Oral q morning   Riociguat  2 mg Oral TID   sodium chloride flush  3 mL Intravenous Q12H      Subjective:   Socorro Ebron was seen and examined today.  Feeling somewhat better today, O2 weaned down to 3 L, during exam, sats in the 90s.  No acute chest pain, fevers no nausea vomiting abdominal pain.   Objective:   Vitals:   11/14/23 0400 11/14/23 0600 11/14/23 0743 11/14/23 0747  BP:  (!) 105/57  (!) 100/52  Pulse: 66 66    Resp: 13 13  16   Temp:  97.7 F (36.5 C)  97.7 F (36.5 C)  TempSrc:  Oral  Oral  SpO2: 94% 95% 95%   Weight:      Height:        Intake/Output Summary (Last 24 hours) at 11/14/2023 0865 Last data filed at 11/14/2023 7846 Gross per 24 hour  Intake 120 ml  Output 200 ml  Net -80 ml     Wt Readings from Last 3 Encounters:  11/13/23 78.9 kg  11/02/23 80.7 kg  10/31/23 79.5 kg     Exam General: Alert and oriented x 3, NAD Cardiovascular: S1 S2 auscultated,  RRR Respiratory: Diminished breath sound at the bases, no wheezing Gastrointestinal: Soft, nontender, nondistended, + bowel sounds Ext: no pedal edema bilaterally Neuro: no new deficits Psych: Normal affect     Data Reviewed:  I have personally reviewed following labs    CBC Lab Results   Component Value Date   WBC 8.1 11/14/2023   RBC 3.92 (L) 11/14/2023   HGB 10.7 (L) 11/14/2023  HCT 34.2 (L) 11/14/2023   MCV 87.2 11/14/2023   MCH 27.3 11/14/2023   PLT 204 11/14/2023   MCHC 31.3 11/14/2023   RDW 17.6 (H) 11/14/2023   LYMPHSABS 0.7 11/13/2023   MONOABS 0.7 11/13/2023   EOSABS 0.0 11/13/2023   BASOSABS 0.0 11/13/2023     Last metabolic panel Lab Results  Component Value Date   NA 139 11/14/2023   K 3.9 11/14/2023   CL 103 11/14/2023   CO2 32 11/14/2023   BUN 22 11/14/2023   CREATININE 1.42 (H) 11/14/2023   GLUCOSE 83 11/14/2023   GFRNONAA 48 (L) 11/14/2023   GFRAA >60 08/01/2018   CALCIUM 9.0 11/14/2023   PHOS 2.0 (L) 10/26/2023   PROT 5.9 (L) 11/14/2023   ALBUMIN 2.7 (L) 11/14/2023   BILITOT 0.8 11/14/2023   ALKPHOS 51 11/14/2023   AST 16 11/14/2023   ALT 10 11/14/2023   ANIONGAP 4 (L) 11/14/2023    CBG (last 3)  Recent Labs    11/13/23 2204 11/14/23 0838  GLUCAP 89 93      Coagulation Profile: Recent Labs  Lab 11/13/23 1448  INR 1.5*     Radiology Studies: I have personally reviewed the imaging studies  DG Chest Port 1 View Result Date: 11/13/2023 CLINICAL DATA:  Questionable sepsis - evaluate for abnormality EXAM: PORTABLE CHEST 1 VIEW COMPARISON:  Chest x-ray 11/02/2023.  Chest CT 04/25/2023 FINDINGS: There is hyperinflation of the lungs compatible with COPD. Heart and mediastinal contours are within normal limits. Prominence of the central pulmonary vessels could reflect pulmonary arterial hypertension as described on prior CT. Focal patchy left mid and lower lung airspace opacities are similar to prior study and could reflect pneumonia. No confluent opacity on the right. No effusions or acute bony abnormality. IMPRESSION: COPD. Prominent pulmonary vessels likely reflects pulmonary arterial hypertension. Patchy left mid and lower lung airspace opacities concerning for pneumonia, similar to prior study. Electronically Signed   By:  Charlett Nose M.D.   On: 11/13/2023 14:47       Kately Graffam M.D. [facility] 11/14/2023, 9:52 AM  Available via Epic secure chat 7am-7pm After 7 pm, please refer to night coverage provider listed on amion.

## 2023-11-14 NOTE — Evaluation (Signed)
 Physical Therapy Evaluation Patient Details Name: Joshua [name] MRN: [id number] DOB: 1936-04-25 Today's Date: 11/14/2023  History of Present Illness  88 yo male presented to ED 11/13/23 with 2 days of /malaise, fevers and chills.  EMS was called  and found to be hypoxic and was placed on 5 L O2 via [place]. Admitted for treatment of Acute on chronic respiratory failure with hypoxia, HCAP PMH:HTN, hyperlipidemia, GERD, diabetes mellitus, COPD, chronic respiratory failure on 2 L O2 via [place], chronic diastolic CHF, pulmonary hypertension, CTEPH, history of PE, gout, OSA, CKD stage IIIb, alcohol use presented with weakness and fevers.  Patient was admitted 10/25/2023-10/28/2023 with septic shock, left lower lobe pneumonia, acute on chronic respiratory failure, needed ICU and pressor support..  Clinical Impression  Pt had returned home with wife and was working with PT since prior hospitalization. Pt reports he was still working with HHPT but he was able to get up and down the stair and walk around his home without AD. Independent in ADLs. Pt is currently limited in safe mobility by generalized weakness and decreased activity tolerance. Pt is mod I for bed mobility and contact guard for transfer from bed to chair. PT recommending resumption of HHPT at discharge. PT will continue to follow acutely.       If plan is discharge home, recommend the following: A little help with walking and/or transfers;A little help with bathing/dressing/bathroom;Help with stairs or ramp for entrance   Can travel by private vehicle        Equipment Recommendations None recommended by PT  Recommendations for Other Services  Other (comment) (Mobility Team)    Functional Status Assessment Patient has had a recent decline in their functional status and demonstrates the ability to make significant improvements in function in a reasonable and predictable amount of time.     Precautions / Restrictions Precautions Precautions:  Fall Recall of Precautions/Restrictions: Intact Precaution/Restrictions Comments: watch SpO2 Restrictions Weight Bearing Restrictions Per Provider Order: No      Mobility  Bed Mobility Overal bed mobility: Modified Independent Bed Mobility: Supine to Sit     Supine to sit: HOB elevated     General bed mobility comments: No assist required.    Transfers Overall transfer level: Needs assistance Equipment used: None Transfers: Sit to/from Stand, Bed to chair/wheelchair/BSC Sit to Stand: Supervision   Step pivot transfers: Contact guard assist       General transfer comment: steady without UE support to come to standing, contact guard for safety with stepping to recliner    Ambulation/Gait               General Gait Details: deferred as no portable O2 tanks available on the floor     Balance Overall balance assessment: Needs assistance Sitting-balance support: Feet supported, No upper extremity supported Sitting balance-Leahy Scale: Fair     Standing balance support: During functional activity, Reliant on assistive device for balance, No upper extremity supported Standing balance-Leahy Scale: Fair Standing balance comment: No overt LOB                             Pertinent Vitals/Pain Pain Assessment Pain Assessment: No/denies pain Faces Pain Scale: No hurt    Home Living Family/patient expects to be discharged to:: Private residence Living Arrangements: Spouse/significant other;Parent Available Help at Discharge: Family;Available 24 hours/day Type of Home: House Home Access: Stairs to enter Entrance Stairs-Rails: Right Entrance Stairs-Number of Steps: 6 Alternate Level  Stairs-Number of Steps: 6+landing+6+landing+4 Home Layout: Two level;Bed/bath upstairs Home Equipment: Rolling Walker (2 wheels);Rollator (4 wheels);Shower seat Additional Comments: supplemental 2LO2 at baselinee    Prior Function Prior Level of Function :  Independent/Modified Independent;Driving                     Extremity/Trunk Assessment   Upper Extremity Assessment Upper Extremity Assessment: Generalized weakness    Lower Extremity Assessment Lower Extremity Assessment: Generalized weakness    Cervical / Trunk Assessment Cervical / Trunk Assessment: Normal  Communication   Communication Communication: No apparent difficulties    Cognition Arousal: Alert Behavior During Therapy: WFL for tasks assessed/performed   PT - Cognitive impairments: No apparent impairments                         Following commands: Intact       Cueing Cueing Techniques: Verbal cues     General Comments General comments (skin integrity, edema, etc.): VSS on 2L O2 via [place]     Assessment/Plan    PT Assessment Patient needs continued PT services  PT Problem List Decreased strength;Decreased activity tolerance;Decreased balance;Decreased mobility       PT Treatment Interventions DME instruction;Gait training;Stair training;Functional mobility training;Therapeutic activities;Therapeutic exercise;Balance training    PT Goals (Current goals can be found in the Care Plan section)  Acute Rehab PT Goals Patient Stated Goal: home PT Goal Formulation: With patient Time For Goal Achievement: 11/24/23 Potential to Achieve Goals: Good    Frequency Min 2X/week     Co-evaluation               AM-PAC PT "6 Clicks" Mobility  Outcome Measure Help needed turning from your back to your side while in a flat bed without using bedrails?: None Help needed moving from lying on your back to sitting on the side of a flat bed without using bedrails?: None Help needed moving to and from a bed to a chair (including a wheelchair)?: A Little Help needed standing up from a chair using your arms (e.g., wheelchair or bedside chair)?: A Little Help needed to walk in hospital room?: A Little Help needed climbing 3-5 steps with a railing? :  A Little 6 Click Score: 20    End of Session Equipment Utilized During Treatment: Gait belt;Oxygen Activity Tolerance: Patient tolerated treatment well Patient left: with call bell/phone within reach;in bed;with family/visitor present;with bed alarm set Nurse Communication: Mobility status PT Visit Diagnosis: Unsteadiness on feet (R26.81)    Time: 1610-9604 PT Time Calculation (min) (ACUTE ONLY): 17 min   Charges:   PT Evaluation $PT Eval Moderate Complexity: 1 Mod   PT General Charges $$ ACUTE PT VISIT: 1 Visit         Dia Jefferys B. Beverely Risen PT, DPT Acute Rehabilitation Services Please use secure chat or  Call Office [phone]   Elon Alas Fleet 11/14/2023, 1:18 PM

## 2023-11-15 ENCOUNTER — Inpatient Hospital Stay (HOSPITAL_COMMUNITY)

## 2023-11-15 DIAGNOSIS — A419 Sepsis, unspecified organism: Secondary | ICD-10-CM

## 2023-11-15 DIAGNOSIS — I2781 Cor pulmonale (chronic): Secondary | ICD-10-CM | POA: Diagnosis not present

## 2023-11-15 DIAGNOSIS — I5033 Acute on chronic diastolic (congestive) heart failure: Secondary | ICD-10-CM | POA: Diagnosis not present

## 2023-11-15 DIAGNOSIS — J189 Pneumonia, unspecified organism: Secondary | ICD-10-CM | POA: Diagnosis not present

## 2023-11-15 DIAGNOSIS — N1832 Chronic kidney disease, stage 3b: Secondary | ICD-10-CM | POA: Diagnosis not present

## 2023-11-15 DIAGNOSIS — E861 Hypovolemia: Secondary | ICD-10-CM

## 2023-11-15 DIAGNOSIS — I2721 Secondary pulmonary arterial hypertension: Secondary | ICD-10-CM | POA: Diagnosis not present

## 2023-11-15 DIAGNOSIS — E1100 Type 2 diabetes mellitus with hyperosmolarity without nonketotic hyperglycemic-hyperosmolar coma (NKHHC): Secondary | ICD-10-CM

## 2023-11-15 LAB — RENAL FUNCTION PANEL
Albumin: 2.6 g/dL — ABNORMAL LOW (ref 3.5–5.0)
Anion gap: 7 (ref 5–15)
BUN: 18 mg/dL (ref 8–23)
CO2: 31 mmol/L (ref 22–32)
Calcium: 9.7 mg/dL (ref 8.9–10.3)
Chloride: 102 mmol/L (ref 98–111)
Creatinine, Ser: 1.24 mg/dL (ref 0.61–1.24)
GFR, Estimated: 57 mL/min — ABNORMAL LOW (ref >60)
Glucose, Bld: 92 mg/dL (ref 70–99)
Phosphorus: 3.1 mg/dL (ref 2.5–4.6)
Potassium: 3.9 mmol/L (ref 3.5–5.1)
Sodium: 140 mmol/L (ref 135–145)

## 2023-11-15 LAB — CBC
HCT: 33.8 % — ABNORMAL LOW (ref 39.0–52.0)
Hemoglobin: 10.5 g/dL — ABNORMAL LOW (ref 13.0–17.0)
MCH: 27.1 pg (ref 26.0–34.0)
MCHC: 31.1 g/dL (ref 30.0–36.0)
MCV: 87.3 fL (ref 80.0–100.0)
Platelets: 193 10*3/uL (ref 150–400)
RBC: 3.87 MIL/uL — ABNORMAL LOW (ref 4.22–5.81)
RDW: 17.6 % — ABNORMAL HIGH (ref 11.5–15.5)
WBC: 5.4 10*3/uL (ref 4.0–10.5)
nRBC: 0 % (ref 0.0–0.2)

## 2023-11-15 LAB — GLUCOSE, CAPILLARY: Glucose-Capillary: 91 mg/dL (ref 70–99)

## 2023-11-15 MED ORDER — TORSEMIDE 20 MG PO TABS
10.0000 mg | ORAL_TABLET | Freq: Every day | ORAL | Status: DC
  Administered 2023-11-15 – 2023-11-17 (×3): 10 mg via ORAL
  Filled 2023-11-15 (×3): qty 1

## 2023-11-15 NOTE — Progress Notes (Signed)
   Patient Name: Joshua [name] Date of Encounter: 11/15/2023 [facility] Cardiologist: Marca Ancona, MD   Interval Summary  .    Feels well.  Denies dyspnea.  Never had chest discomfort.  Vital Signs .    Vitals:   11/15/23 0437 11/15/23 0700 11/15/23 0724 11/15/23 0800  BP: 114/60  112/64 112/64  Pulse: 70 66 81 68  Resp: 18  16   Temp: 97.8 F (36.6 C)  98 F (36.7 C)   TempSrc: Oral  Oral   SpO2: 96% 90% 93% 96%  Weight:      Height:        Intake/Output Summary (Last 24 hours) at 11/15/2023 0948 Last data filed at 11/15/2023 1610 Gross per 24 hour  Intake 240 ml  Output 950 ml  Net -710 ml      11/13/2023    3:04 PM 11/02/2023    4:46 PM 10/31/2023    9:30 AM  Last 3 Weights  Weight (lbs) 174 lb 178 lb 175 lb 3.2 oz  Weight (kg) 78.926 kg 80.74 kg 79.47 kg      Telemetry/ECG    Sinus rhythm with occasional PVCs- Personally Reviewed  ECG shows sinus rhythm, PAC, bifascicular block (RBBB plus LAFB).  Physical Exam .   GEN: No acute distress.   Neck: 10 cm JVD Cardiac: RRR, widely split second heart sound no murmurs, rubs, or gallops.  Respiratory: Clear to auscultation bilaterally. GI: Soft, nontender, non-distended  MS: No edema  Assessment & Plan .     88 y.o. male presenting with acute on chronic diastolic heart failure, on a background of pulmonary HTN/cor pulmonale (WHO group 3 from COPD, possibly also group 4 due to previous pulmonary embolism) with severely reduced RV systolic function, history of PE, chronic hypotension on midodrine, HLD, OSA, CKD stage IIIb .   Had mild nonobstructive CAD at right and left heart catheterization in 2023 when he was admitted with septic shock.  LVEF has been 60 to 65% on echocardiograms performed in 2023, 2024 and February 2025.  He is readmitted after recent hospitalization in early March with right lower lobe pneumonia, also complicated by mild heart failure exacerbation.  Once again he has fever, chills  and suspicion for left pneumonia. Chronically on riociguat ,Farxiga, torsemide for severe pulmonary artery hypertension and right heart failure, but also requiring midodrine for hypotension. Diuretics held yesterday for hypotension and AKI (both have improved, in fact creatinine is better than his usual baseline.  Normal potassium). Resume torsemide at his previous dose.  [facility] will sign off.   Medication Recommendations: Continue all his outpatient cardiac medications as before Other recommendations (labs, testing, etc): Monitor renal function after resumption of diuretics. Follow up as an outpatient: Currently scheduled for follow-up visit in the heart failure clinic 03/01/2024.  Unless there are changes to the current clinical situation, this still appears appropriate.   For questions or updates, please contact [facility] Please consult [email or web address] for contact info under        Signed, Thurmon Fair, MD

## 2023-11-15 NOTE — Evaluation (Signed)
 Occupational Therapy Evaluation/Discharge Patient Details Name: Joshua [name] MRN: [id number] DOB: [birth year] Today's Date: 11/15/2023   History of Present Illness   88 yo male presented to ED 3/with 2 days of /malaise, fevers and chills.  EMS was called  and found to be hypoxic and was placed on 5 L O2 via [place]. Admitted for treatment of Acute on chronic respiratory failure with hypoxia, HCAP PMH:HTN, hyperlipidemia, GERD, diabetes mellitus, COPD, chronic respiratory failure on 2 L O2 via [place], chronic diastolic CHF, pulmonary hypertension, CTEPH, history of PE, gout, OSA, CKD stage IIIb, alcohol use presented with weakness and fevers.  Patient was admitted 10/25/2023-10/28/2023 with septic shock, left lower lobe pneumonia, acute on chronic respiratory failure, needed ICU and pressor support..     Clinical Impressions PTA, pt lives with spouse and typically Modified Independent with ADLs, IADLs, driving and mobility. Pt presents now with minor deficits in cardiopulmonary endurance though moving well. Pt able to manage full bathing tasks standing at sink and toileting task without AD or physical assistance. Provided energy conservation w/ handout provided w/ pt denying concerns managing daily routine at home. No further skilled OT services needed at this time. Encouraged continued OOB activity with mobility specialists while admitted.  SpO2 92% on 4 L O2 with ADLs and in room mobility     If plan is discharge home, recommend the following:   Other (comment) (PRN)     Functional Status Assessment   Patient has had a recent decline in their functional status and demonstrates the ability to make significant improvements in function in a reasonable and predictable amount of time.     Equipment Recommendations   None recommended by OT     Recommendations for Other Services         Precautions/Restrictions   Precautions Precautions: Fall Recall of Precautions/Restrictions:  Intact Precaution/Restrictions Comments: watch SpO2 Restrictions Weight Bearing Restrictions Per Provider Order: No     Mobility Bed Mobility Overal bed mobility: Modified Independent                  Transfers Overall transfer level: Modified independent Equipment used: None Transfers: Sit to/from Stand Sit to Stand: Modified independent (Device/Increase time)                  Balance Overall balance assessment: No apparent balance deficits (not formally assessed)                                         ADL either performed or assessed with clinical judgement   ADL Overall ADL's : Needs assistance/impaired Eating/Feeding: Independent   Grooming: Modified independent;Standing;Wash/dry face;Oral care;Brushing hair;Wash/dry hands;Applying deodorant   Upper Body Bathing: Modified independent;Standing   Lower Body Bathing: Modified independent;Sit to/from stand   Upper Body Dressing : Modified independent;Standing   Lower Body Dressing: Modified independent;Sitting/lateral leans;Sit to/from stand   Toilet Transfer: Supervision/safety;Ambulation Toilet Transfer Details (indicate cue type and reason): supervision for line mgmt/safety but no LOB or concerns observed Toileting- Clothing Manipulation and Hygiene: Modified independent;Sitting/lateral lean;Sit to/from stand         General ADL Comments: Pt able to complete full bathing task standing at sink and toilet use during session. Provided energy conservation education and handout w/ pt denying concerns.     Vision Baseline Vision/History: 1 Wears glasses Ability to See in Adequate Light: 0 Adequate Patient Visual Report: No change  from baseline Vision Assessment?: No apparent visual deficits     Perception         Praxis         Pertinent Vitals/Pain Pain Assessment Pain Assessment: No/denies pain     Extremity/Trunk Assessment Upper Extremity Assessment Upper Extremity  Assessment: Overall WFL for tasks assessed;Right hand dominant   Lower Extremity Assessment Lower Extremity Assessment: Defer to PT evaluation   Cervical / Trunk Assessment Cervical / Trunk Assessment: Normal   Communication Communication Communication: No apparent difficulties   Cognition Arousal: Alert Behavior During Therapy: WFL for tasks assessed/performed Cognition: No apparent impairments                               Following commands: Intact       Cueing  General Comments   Cueing Techniques: Verbal cues  Daughter at bedside, denied concerns. received on 4 L O2 w/ SpO2 92% after activity. quickly improved to 100% with seated rest break   Exercises     Shoulder Instructions      Home Living Family/patient expects to be discharged to:: Private residence Living Arrangements: Spouse/significant other;Parent Available Help at Discharge: Family;Available 24 hours/day Type of Home: House Home Access: Stairs to enter [employer] of Steps: 6 Entrance Stairs-Rails: Right Home Layout: Two level;Bed/bath upstairs Alternate Level Stairs-Number of Steps: 6+landing+6+landing+4 Alternate Level Stairs-Rails: Right Bathroom Shower/Tub: Producer, television/film/video: Handicapped height Bathroom Accessibility: Yes   Home Equipment: Agricultural consultant (2 wheels);Rollator (4 wheels);Shower seat   Additional Comments: supplemental 2LO2 at baselinee      Prior Functioning/Environment Prior Level of Function : Independent/Modified Independent;Driving                    OT Problem List: Cardiopulmonary status limiting activity   OT Treatment/Interventions:        OT Goals(Current goals can be found in the care plan section)   Acute Rehab OT Goals Patient Stated Goal: home soon OT Goal Formulation: All assessment and education complete, DC therapy   OT Frequency:       Co-evaluation              AM-PAC OT "6 Clicks" Daily  Activity     Outcome Measure Help from another person eating meals?: None Help from another person taking care of personal grooming?: None Help from another person toileting, which includes using toliet, bedpan, or urinal?: None Help from another person bathing (including washing, rinsing, drying)?: None Help from another person to put on and taking off regular upper body clothing?: None Help from another person to put on and taking off regular lower body clothing?: None 6 Click Score: 24   End of Session Equipment Utilized During Treatment: Oxygen Nurse Communication: Mobility status  Activity Tolerance: Patient tolerated treatment well Patient left: in chair;with call bell/phone within reach;with chair alarm set;with family/visitor present  OT Visit Diagnosis: Other (comment) (decreased cardiopulmonary endurance)                Time: 8119-1478 OT Time Calculation (min): 32 min Charges:  OT General Charges $OT Visit: 1 Visit OT Evaluation $OT Eval Low Complexity: 1 Low OT Treatments $Self Care/Home Management : 8-22 mins  Bradd Canary, OTR/L Acute Rehab Services Office: [phone]   Lorre Munroe 11/15/2023, 10:50 AM

## 2023-11-15 NOTE — Evaluation (Signed)
 Clinical/Bedside Swallow Evaluation Patient Details  Name: Joshua [name] MRN: [id number] Date of Birth: 10/27/1935  Today's Date: 11/15/2023 Time: SLP Start Time (ACUTE ONLY): 1320 SLP Stop Time (ACUTE ONLY): 1340 SLP Time Calculation (min) (ACUTE ONLY): 20 min  Past Medical History:  Past Medical History:  Diagnosis Date   Arthritis    BPH (benign prostatic hyperplasia)    CHF (congestive heart failure) (HCC)    Chronic hiccups    Community acquired bilateral lower lobe pneumonia 10/10/2023   COPD (chronic obstructive pulmonary disease) (HCC)    COVID-19 virus infection 05/20/2022   Ear stuffiness, bilateral 05/09/2021   Folliculitis    posterior scalp per office visit note of Dr Selena Batten 07/20/2014     GERD (gastroesophageal reflux disease)    Gout    Hypertension    Hypotension    Imaging of gastrointestinal tract abnormal 07/29/2022   NSTEMI (non-ST elevated myocardial infarction) (HCC) 03/19/2022   PE (pulmonary thromboembolism) (HCC)    Phlebitis    right arm  at least 20 years ago    Pneumonia    hx of pneumonia as a child    Pulmonary embolism (HCC) 03/17/2022   Pulmonary hypertension (HCC)    Septic shock (HCC) 10/25/2023   Severe sepsis (HCC) 03/16/2022   Sleep apnea    Past Surgical History:  Past Surgical History:  Procedure Laterality Date   BIOPSY  11/03/2020   Procedure: BIOPSY;  Surgeon: Jeani Hawking, MD;  Location: [facility];  Service: Endoscopy;;   BIOPSY  07/06/2021   Procedure: BIOPSY;  Surgeon: Jeani Hawking, MD;  Location: [facility];  Service: Endoscopy;;   bone removed from little toe right foot      CHOLECYSTECTOMY     ESOPHAGOGASTRODUODENOSCOPY (EGD) WITH PROPOFOL N/A 11/03/2020   Procedure: ESOPHAGOGASTRODUODENOSCOPY (EGD) WITH PROPOFOL;  Surgeon: Jeani Hawking, MD;  Location: [facility];  Service: Endoscopy;  Laterality: N/A;   ESOPHAGOGASTRODUODENOSCOPY (EGD) WITH PROPOFOL N/A 07/06/2021   Procedure: ESOPHAGOGASTRODUODENOSCOPY (EGD) WITH  PROPOFOL;  Surgeon: Jeani Hawking, MD;  Location: [facility];  Service: Endoscopy;  Laterality: N/A;   pilonidal cyst removal      RIGHT HEART CATH N/A 12/13/2020   Procedure: RIGHT HEART CATH;  Surgeon: Laurey Morale, MD;  Location: [facility] INVASIVE CV LAB;  Service: Cardiovascular;  Laterality: N/A;   RIGHT/LEFT HEART CATH AND CORONARY ANGIOGRAPHY N/A 03/21/2022   Procedure: RIGHT/LEFT HEART CATH AND CORONARY ANGIOGRAPHY;  Surgeon: Laurey Morale, MD;  Location: [facility] INVASIVE CV LAB;  Service: Cardiovascular;  Laterality: N/A;   TOTAL KNEE ARTHROPLASTY Left 09/13/2014   Procedure: LEFT TOTAL KNEE ARTHROPLASTY;  Surgeon: Shelda Pal, MD;  Location: [facility];  Service: Orthopedics;  Laterality: Left;   HPI:  Joshua [name] is a 88 y.o. male with medical history significant of hypertension, hypertension, hyperlipidemia, GERD, diabetes, COPD, chronic respiratory failure on 2 L, chronic diastolic CHF, pulmonary hypertension, CTEPH, cor pulmonale, history of PE, gout, OSA, CKD 3B, alcohol use presenting with worsening fever and weakness.     Patient was admitted 358 3-11 with septic shock and pneumonia as well as respiratory failure.  Improved with treatment, initially in the ICU overnight but was weaned off pressor support.  Discharged on additional 5 days of antibiotics to be completed on 3/16.     Now reporting 2 days of malaise, fever, chills.  EMS called today and patient was hypoxic and his oxygen was increased to 5 L with improvement.    Assessment / Plan / Recommendation  Clinical Impression  Pt seen for clinical swallow evaluation and known to this discipline from prior hospitalization.  MBS completed on 10/26/23 recommending Regular/thin liquids with strict adherence re: swallow strategies implemented to A with minimizing aspiration risk.  Pt recalled strategies, but denied current dysphagia.  No overt s/s of aspiration, timely swallow and adequate oral preparation observed.  Pt stated he takes  medication for GERD.  Discussed adherence to prior swallow precautions d/t deconditioning/current PNA and pt in agreement with plan.  Recommend continue current diet with swallow precautions in place.  ST will f/u briefly for education/dysphagia tx/management.  Thank you for this consult. SLP Visit Diagnosis: Dysphagia, oropharyngeal phase (R13.12)    Aspiration Risk  Mild aspiration risk    Diet Recommendation   Thin;Age appropriate regular  Medication Administration: Whole meds with liquid    Other  Recommendations Oral Care Recommendations: Oral care BID    Recommendations for follow up therapy are one component of a multi-disciplinary discharge planning process, led by the attending physician.  Recommendations may be updated based on patient status, additional functional criteria and insurance authorization.  Follow up Recommendations No SLP follow up      Assistance Recommended at Discharge  TBD  Functional Status Assessment Patient has had a recent decline in their functional status and demonstrates the ability to make significant improvements in function in a reasonable and predictable amount of time.  Frequency and Duration min 1 x/week  1 week       Prognosis Prognosis for improved oropharyngeal function: Good      Swallow Study   General Date of Onset: 11/13/23 HPI: Joshua [name] is a 88 y.o. male with medical history significant of hypertension, hypertension, hyperlipidemia, GERD, diabetes, COPD, chronic respiratory failure on 2 L, chronic diastolic CHF, pulmonary hypertension, CTEPH, cor pulmonale, history of PE, gout, OSA, CKD 3B, alcohol use presenting with worsening fever and weakness.     Patient was admitted 358 3-11 with septic shock and pneumonia as well as respiratory failure.  Improved with treatment, initially in the ICU overnight but was weaned off pressor support.  Discharged on additional 5 days of antibiotics to be completed on 3/16.     Now reporting 2  days of malaise, fever, chills.  EMS called today and patient was hypoxic and his oxygen was increased to 5 L with improvement. Type of Study: Bedside Swallow Evaluation Previous Swallow Assessment: MBS on 10/26/23 indicating  Patient presents with a mild pharyngeal phase dysphagia characterized by delayed swallow initiation and decreased laryngeal closure resulting in penetration of viscosities thinner than honey thick. Both thin and nectar thick liquids penetrated, with thin liquid penetrating to the level of the vocal cords without observed evidence of sensation. Chin tuck and supraglottic swallow did not assist to decrease. Penetration episodes decreased in quantity and frequency with use of small single cup sips. Diet Prior to this Study: Regular;Thin liquids (Level 0) Temperature Spikes Noted: No Respiratory Status: Nasal cannula (2L) History of Recent Intubation: No Behavior/Cognition: Alert;Cooperative Oral Cavity Assessment: Within Functional Limits Oral Care Completed by SLP: Recent completion by staff Oral Cavity - Dentition: Dentures, top;Dentures, bottom Vision: Functional for self-feeding Self-Feeding Abilities: Able to feed self Patient Positioning: Upright in bed Baseline Vocal Quality: Hoarse (slight) Volitional Cough: Strong Volitional Swallow: Able to elicit    Oral/Motor/Sensory Function Overall Oral Motor/Sensory Function: Within functional limits   Ice Chips Ice chips: Not tested   Thin Liquid Thin Liquid: Within functional limits Presentation: Cup Other Comments:  encouraged small sips d/t hx    Nectar Thick Nectar Thick Liquid: Not tested   Honey Thick Honey Thick Liquid: Not tested   Puree Puree: Within functional limits Presentation: Self Fed Other Comments: encouraged small bites d/t hx   Solid     Solid: Within functional limits Presentation: Self Fed Other Comments: encouraged small bites d/t hx      Pat Deisi Salonga,M.S.,CCC-SLP 11/15/2023,2:31 PM

## 2023-11-15 NOTE — Progress Notes (Addendum)
 [facility]                                                                              Joshua [name], is a 88 y.o. male, DOB - August 30, 1935, ZOX:[id number] Admit date - 11/13/2023    Outpatient Primary MD for the patient is [name], DO  LOS - 1  days  Chief Complaint  Patient presents with   Weakness   Fever       Brief summary   Patient is a 88 year old male with HTN, hyperlipidemia, GERD, diabetes mellitus, COPD, chronic respiratory failure on 2 L O2 via [place], chronic diastolic CHF, pulmonary hypertension, CTEPH, history of PE, gout, OSA, CKD stage IIIb, alcohol use presented with weakness and fevers.  Patient was admitted 10/25/2023-10/28/2023 with septic shock, left lower lobe pneumonia, acute on chronic respiratory failure, needed ICU and pressor support.. Patient now presented with 2 days of malaise, fevers and chills.  EMS was called and found to be hypoxic and was placed on 5 L O2 via [place].  BP was noted to be soft and received IV fluid bolus. Flu, COVID, RSV negative.  Chest x-ray consistent with COPD, pulmonary hypertension, patchy left-sided opacities consistent with pneumonia.  Patient was placed on vancomycin, cefepime, midodrine and fluids.   Assessment & Plan    Principal Problem: Acute on chronic respiratory failure with hypoxia, HCAP -Presented with 2 days of fevers, weakness, chills, chest x-ray.  PNA, initially placed on 5 L O2 in ED,  -Wean O2 as tolerated -Continue bronchodilators, midodrine for BP support. -Leukocytosis improved, 5.4, lactic acidosis improved -Urine strep antigen negative, urine Legionella antigen pending -Reviewed prior chest x-ray, CT scans, has recurrent pneumonia on the left side, obtain SLP evaluation,  -Has hiatal hernia, seen on prior CTA chest on 03/13/2023, obtain high-resolution CT chest  - For now continue IV vancomycin cefepime.  Incentive spirometry  Active problems CKD stage IIIb -Presented with creatinine of  1.8, creatinine was 1.84 on 3/21 -Received IV fluids in ED, creatinine 1.4.  Cautious use of fluids with history of CHF, right ventricular failure. -Creatinine improved, 1.2   CTEPH with history of PE Pulmonary hypertension,  Chronic cor pulmonale with RV failure - Continue home [place] - Continue home Eliquis, H&H stable -Cardiology following, appreciate recommendations   Chronic diastolic CHF with RV failure -2D echo on 10/11/2023 showed EF of 55% with severely enlarged RV, severely reduced function -Cardiology following, resumed torsemide  COPD -Known history of COPD with PFTs consistent with obstructive lung disease and severely reduced DLCO - no acute wheezing, continue bronchodilators   Hypotension -Continue midodrine, BP currently stable -Received IV fluids in ED   Chronic urinary retention - Continue home finasteride - May continue to self cath   Hyperlipidemia - Continue home atorvastatin   GERD - Continue home PPI   Diabetes mellitus type II -Hemoglobin A1c was 5.5 on 10/10/2023, controlled, patient on Farxiga CBG (last 3)  Recent Labs    11/14/23 0838 11/14/23 2116 11/15/23 0726  GLUCAP 93 92 91  Continue sliding scale insulin while inpatient    OSA - Not on CPAP   Gout - Continue  home allopurinol   Generalized debility -PT evaluation recommended home health PT, -Home O2 evaluation prior to discharge.   Estimated body mass index is 21.75 kg/m as calculated from the following:   Height as of this encounter: 6\' 3"  (1.905 m).   Weight as of this encounter: 78.9 kg.  Code Status: DNR DVT Prophylaxis:   apixaban (ELIQUIS) tablet 5 mg   Level of Care: Level of care: Progressive Family Communication: Updated patient's daughter on the phone Disposition Plan:      Remains inpatient appropriate:      Procedures:   Consultants:   Cardiology  Antimicrobials:   Anti-infectives (From admission, onward)    Start     Dose/Rate Route Frequency  Ordered Stop   11/15/23 0300  ceFEPIme (MAXIPIME) 2 g in sodium chloride 0.9 % 100 mL IVPB        2 g 200 mL/hr over 30 Minutes Intravenous Every 12 hours 11/14/23 1651     11/14/23 1600  vancomycin (VANCOREADY) IVPB 1250 mg/250 mL        1,250 mg 166.7 mL/hr over 90 Minutes Intravenous Every 24 hours 11/14/23 1456     11/14/23 1500  vancomycin (VANCOREADY) IVPB 1750 mg/350 mL  Status:  Discontinued        1,750 mg 175 mL/hr over 120 Minutes Intravenous Every 48 hours 11/13/23 1725 11/14/23 1456   11/13/23 2300  ceFEPIme (MAXIPIME) 2 g in sodium chloride 0.9 % 100 mL IVPB  Status:  Discontinued        2 g 200 mL/hr over 30 Minutes Intravenous Every 8 hours 11/13/23 1725 11/14/23 1651   11/13/23 1515  vancomycin (VANCOCIN) IVPB 1000 mg/200 mL premix        1,000 mg 200 mL/hr over 60 Minutes Intravenous  Once 11/13/23 1505 11/13/23 1700   11/13/23 1515  ceFEPIme (MAXIPIME) 2 g in sodium chloride 0.9 % 100 mL IVPB        2 g 200 mL/hr over 30 Minutes Intravenous  Once 11/13/23 1505 11/13/23 1555          Medications  allopurinol  100 mg Oral q morning   apixaban  5 mg Oral BID   arformoterol  15 mcg Nebulization BID   And   umeclidinium bromide  1 puff Inhalation Daily   atorvastatin  40 mg Oral Daily   dapagliflozin propanediol  10 mg Oral QAC breakfast   finasteride  5 mg Oral QHS   gabapentin  300 mg Oral TID   insulin aspart  0-5 Units Subcutaneous QHS   insulin aspart  0-9 Units Subcutaneous TID WC   midodrine  15 mg Oral TID WC   pantoprazole  40 mg Oral q morning   Riociguat  2 mg Oral TID   sodium chloride flush  3 mL Intravenous Q12H   torsemide  10 mg Oral Daily      Subjective:   Joshua [name] was seen and examined today.  Feeling somewhat better, O2 sats 96 to 100% on 2 L. No acute chest pain, fevers no nausea vomiting abdominal pain.  Overall improving.  Objective:   Vitals:   11/15/23 0437 11/15/23 0700 11/15/23 0724 11/15/23 0800  BP: 114/60  112/64  112/64  Pulse: 70 66 81 68  Resp: 18  16   Temp: 97.8 F (36.6 C)  98 F (36.7 C)   TempSrc: Oral  Oral   SpO2: 96% 90% 93% 96%  Weight:      Height:  Intake/Output Summary (Last 24 hours) at 11/15/2023 1148 Last data filed at 11/15/2023 1040 Gross per 24 hour  Intake 243 ml  Output 950 ml  Net -707 ml     Wt Readings from Last 3 Encounters:  11/13/23 78.9 kg  11/02/23 80.7 kg  10/31/23 79.5 kg   Physical Exam General: Alert and oriented x 3, NAD Cardiovascular: S1 S2 clear, RRR.  Respiratory: Diminished breath sound at the bases, no rhonchi. Gastrointestinal: Soft, nontender, nondistended, NBS Ext: no pedal edema bilaterally Neuro: no new deficits, global weakness Psych: Normal affect    Data Reviewed:  I have personally reviewed following labs    CBC Lab Results  Component Value Date   WBC 5.4 11/15/2023   RBC 3.87 (L) 11/15/2023   HGB 10.5 (L) 11/15/2023   HCT 33.8 (L) 11/15/2023   MCV 87.3 11/15/2023   MCH 27.1 11/15/2023   PLT 193 11/15/2023   MCHC 31.1 11/15/2023   RDW 17.6 (H) 11/15/2023   LYMPHSABS 0.7 11/13/2023   MONOABS 0.7 11/13/2023   EOSABS 0.0 11/13/2023   BASOSABS 0.0 11/13/2023     Last metabolic panel Lab Results  Component Value Date   NA 140 11/15/2023   K 3.9 11/15/2023   CL 102 11/15/2023   CO2 31 11/15/2023   BUN 18 11/15/2023   CREATININE 1.24 11/15/2023   GLUCOSE 92 11/15/2023   GFRNONAA 57 (L) 11/15/2023   GFRAA >60 08/01/2018   CALCIUM 9.7 11/15/2023   PHOS 3.1 11/15/2023   PROT 5.9 (L) 11/14/2023   ALBUMIN 2.6 (L) 11/15/2023   BILITOT 0.8 11/14/2023   ALKPHOS 51 11/14/2023   AST 16 11/14/2023   ALT 10 11/14/2023   ANIONGAP 7 11/15/2023    CBG (last 3)  Recent Labs    11/14/23 0838 11/14/23 2116 11/15/23 0726  GLUCAP 93 92 91      Coagulation Profile: Recent Labs  Lab 11/13/23 1448  INR 1.5*     Radiology Studies: I have personally reviewed the imaging studies  DG Chest Port 1  View Result Date: 11/13/2023 CLINICAL DATA:  Questionable sepsis - evaluate for abnormality EXAM: PORTABLE CHEST 1 VIEW COMPARISON:  Chest x-ray 11/02/2023.  Chest CT 04/25/2023 FINDINGS: There is hyperinflation of the lungs compatible with COPD. Heart and mediastinal contours are within normal limits. Prominence of the central pulmonary vessels could reflect pulmonary arterial hypertension as described on prior CT. Focal patchy left mid and lower lung airspace opacities are similar to prior study and could reflect pneumonia. No confluent opacity on the right. No effusions or acute bony abnormality. IMPRESSION: COPD. Prominent pulmonary vessels likely reflects pulmonary arterial hypertension. Patchy left mid and lower lung airspace opacities concerning for pneumonia, similar to prior study. Electronically Signed   By: Charlett Nose M.D.   On: 11/13/2023 14:47       Sorayah Schrodt M.D. [facility] 11/15/2023, 11:48 AM  Available via Epic secure chat 7am-7pm After 7 pm, please refer to night coverage provider listed on amion.

## 2023-11-15 NOTE — Progress Notes (Signed)
 This RN was informed by the nurse tech that the patient refused a CBG check for 1200. I asked the pt again and the pt refused. This RN educated the pt about the importance of monitoring CBG and administering insulin based on the results. Pt still refused.   Christain [place], RN

## 2023-11-16 DIAGNOSIS — A419 Sepsis, unspecified organism: Secondary | ICD-10-CM | POA: Diagnosis not present

## 2023-11-16 DIAGNOSIS — I9589 Other hypotension: Secondary | ICD-10-CM | POA: Diagnosis not present

## 2023-11-16 DIAGNOSIS — J189 Pneumonia, unspecified organism: Secondary | ICD-10-CM | POA: Diagnosis not present

## 2023-11-16 LAB — RENAL FUNCTION PANEL
Albumin: 2.6 g/dL — ABNORMAL LOW (ref 3.5–5.0)
Anion gap: 6 (ref 5–15)
BUN: 16 mg/dL (ref 8–23)
CO2: 30 mmol/L (ref 22–32)
Calcium: 9.6 mg/dL (ref 8.9–10.3)
Chloride: 101 mmol/L (ref 98–111)
Creatinine, Ser: 1.22 mg/dL (ref 0.61–1.24)
GFR, Estimated: 58 mL/min — ABNORMAL LOW (ref >60)
Glucose, Bld: 86 mg/dL (ref 70–99)
Phosphorus: 3.1 mg/dL (ref 2.5–4.6)
Potassium: 4 mmol/L (ref 3.5–5.1)
Sodium: 137 mmol/L (ref 135–145)

## 2023-11-16 LAB — CBC
HCT: 34.7 % — ABNORMAL LOW (ref 39.0–52.0)
Hemoglobin: 10.9 g/dL — ABNORMAL LOW (ref 13.0–17.0)
MCH: 27.3 pg (ref 26.0–34.0)
MCHC: 31.4 g/dL (ref 30.0–36.0)
MCV: 86.8 fL (ref 80.0–100.0)
Platelets: 207 10*3/uL (ref 150–400)
RBC: 4 MIL/uL — ABNORMAL LOW (ref 4.22–5.81)
RDW: 17.4 % — ABNORMAL HIGH (ref 11.5–15.5)
WBC: 5.2 10*3/uL (ref 4.0–10.5)
nRBC: 0 % (ref 0.0–0.2)

## 2023-11-16 LAB — LEGIONELLA PNEUMOPHILA SEROGP 1 UR AG: L. pneumophila Serogp 1 Ur Ag: NEGATIVE

## 2023-11-16 NOTE — Plan of Care (Signed)
  Problem: Coping: Goal: Ability to adjust to condition or change in health will improve Outcome: Progressing   Problem: Clinical Measurements: Goal: Respiratory complications will improve Outcome: Progressing   Problem: Activity: Goal: Risk for activity intolerance will decrease Outcome: Progressing   Problem: Elimination: Goal: Will not experience complications related to urinary retention Outcome: Progressing

## 2023-11-16 NOTE — Progress Notes (Signed)
 Mobility Specialist Progress Note:    11/16/23 1100  Oxygen Therapy  O2 Device Nasal Cannula  O2 Flow Rate (L/min) 3 L/min  Mobility  Activity Ambulated with assistance in hallway  Level of Assistance Contact guard assist, steadying assist  Assistive Device None  Distance Ambulated (ft) 400 ft  Activity Response Tolerated well  Mobility Referral Yes  Mobility visit 1 Mobility  Mobility Specialist Start Time (ACUTE ONLY) 1044  Mobility Specialist Stop Time (ACUTE ONLY) 1056  Mobility Specialist Time Calculation (min) (ACUTE ONLY) 12 min   Received pt in bed having no complaints and agreeable to mobility. VSS throughout on 3L O2. Pt was asymptomatic throughout ambulation and returned to room w/o fault. Left on EOB w/ call bell in reach and all needs met.   D'Vante Earlene Plater Mobility Specialist Please contact via Special educational needs teacher or Rehab office at [phone]

## 2023-11-16 NOTE — Progress Notes (Signed)
 [facility]                                                                              Joshua [name], is a 88 y.o. male, DOB - 09-24-1935, UJW:[id number] Admit date - 11/13/2023    Outpatient Primary MD for the patient is [name], DO  LOS - 2  days  Chief Complaint  Patient presents with   Weakness   Fever       Brief summary   Patient is a 88 year old male with HTN, hyperlipidemia, GERD, diabetes mellitus, COPD, chronic respiratory failure on 2 L O2 via [place], chronic diastolic CHF, pulmonary hypertension, CTEPH, history of PE, gout, OSA, CKD stage IIIb, alcohol use presented with weakness and fevers.  Patient was admitted 10/25/2023-10/28/2023 with septic shock, left lower lobe pneumonia, acute on chronic respiratory failure, needed ICU and pressor support.. Patient now presented with 2 days of malaise, fevers and chills.  EMS was called and found to be hypoxic and was placed on 5 L O2 via [place].  BP was noted to be soft and received IV fluid bolus. Flu, COVID, RSV negative.  Chest x-ray consistent with COPD, pulmonary hypertension, patchy left-sided opacities consistent with pneumonia.  Patient was placed on vancomycin, cefepime, midodrine and fluids.   Assessment & Plan    Principal Problem: Acute on chronic respiratory failure with hypoxia, HCAP -Presented with 2 days of fevers, weakness, chills, chest x-ray.  PNA, initially placed on 5 L O2 in ED,  -Wean O2 as tolerated -Continue bronchodilators, midodrine for BP support. -Leukocytosis improved, 5.4, lactic acidosis improved -Urine strep antigen negative,, Legionella antigen negative -Reviewed prior chest x-ray, CT scans, has recurrent pneumonia on the left side, SLP evaluation done, mild aspiration risk -Has hiatal hernia, seen on prior CTA chest on 03/13/2023 - For now continue IV vancomycin cefepime.  Incentive spirometry -HRCT results still pending  Active problems CKD stage IIIb -Presented with  creatinine of 1.8, creatinine was 1.84 on 3/21 -Received IV fluids in ED, creatinine 1.4.  Cautious use of fluids with history of CHF, right ventricular failure. -Creatinine improved, 1.2   CTEPH with history of PE Pulmonary hypertension,  Chronic cor pulmonale with RV failure - Continue home [place]as - Continue home Eliquis, H&H stable -Cardiology following, appreciate recommendations   Chronic diastolic CHF with RV failure -2D echo on 10/11/2023 showed EF of 55% with severely enlarged RV, severely reduced function -Cardiology following, resumed torsemide -Negative balance of 2.8 L  COPD -Known history of COPD with PFTs consistent with obstructive lung disease and severely reduced DLCO -Continue bronchodilators   Hypotension -Continue midodrine, BP currently stable -Received IV fluids in ED   Chronic urinary retention - Continue home finasteride - May continue to self cath   Hyperlipidemia - Continue home atorvastatin   GERD - Continue home PPI   Diabetes mellitus type II -Hemoglobin A1c was 5.5 on 10/10/2023, controlled, patient on Farxiga CBG (last 3)  Recent Labs    11/14/23 0838 11/14/23 2116 11/15/23 0726  GLUCAP 93 92 91   -CBGs have remained stable, discontinued SSI    OSA - Not on CPAP   Gout -  Continue home allopurinol   Generalized debility -PT evaluation recommended home health PT, -Home O2 evaluation prior to discharge.   Estimated body mass index is 21.75 kg/m as calculated from the following:   Height as of this encounter: 6\' 3"  (1.905 m).   Weight as of this encounter: 78.9 kg.  Code Status: DNR DVT Prophylaxis:   apixaban (ELIQUIS) tablet 5 mg   Level of Care: Level of care: Progressive Family Communication: Updated patient's daughter on the phone on 3/29 Disposition Plan:      Remains inpatient appropriate:      Procedures:   Consultants:   Cardiology  Antimicrobials:   Anti-infectives (From admission, onward)    Start      Dose/Rate Route Frequency Ordered Stop   11/15/23 0300  ceFEPIme (MAXIPIME) 2 g in sodium chloride 0.9 % 100 mL IVPB        2 g 200 mL/hr over 30 Minutes Intravenous Every 12 hours 11/14/23 1651 11/18/23 0259   11/14/23 1600  vancomycin (VANCOREADY) IVPB 1250 mg/250 mL        1,250 mg 166.7 mL/hr over 90 Minutes Intravenous Every 24 hours 11/14/23 1456 11/18/23 1559   11/14/23 1500  vancomycin (VANCOREADY) IVPB 1750 mg/350 mL  Status:  Discontinued        1,750 mg 175 mL/hr over 120 Minutes Intravenous Every 48 hours 11/13/23 1725 11/14/23 1456   11/13/23 2300  ceFEPIme (MAXIPIME) 2 g in sodium chloride 0.9 % 100 mL IVPB  Status:  Discontinued        2 g 200 mL/hr over 30 Minutes Intravenous Every 8 hours 11/13/23 1725 11/14/23 1651   11/13/23 1515  vancomycin (VANCOCIN) IVPB 1000 mg/200 mL premix        1,000 mg 200 mL/hr over 60 Minutes Intravenous  Once 11/13/23 1505 11/13/23 1700   11/13/23 1515  ceFEPIme (MAXIPIME) 2 g in sodium chloride 0.9 % 100 mL IVPB        2 g 200 mL/hr over 30 Minutes Intravenous  Once 11/13/23 1505 11/13/23 1555          Medications  allopurinol  100 mg Oral q morning   apixaban  5 mg Oral BID   arformoterol  15 mcg Nebulization BID   And   umeclidinium bromide  1 puff Inhalation Daily   atorvastatin  40 mg Oral Daily   dapagliflozin propanediol  10 mg Oral QAC breakfast   finasteride  5 mg Oral QHS   gabapentin  300 mg Oral TID   midodrine  15 mg Oral TID WC   pantoprazole  40 mg Oral q morning   Riociguat  2 mg Oral TID   sodium chloride flush  3 mL Intravenous Q12H   torsemide  10 mg Oral Daily      Subjective:   Joshua [name] was seen and examined today.  No acute complaints, on 3 L O2 via [place].  No acute chest pain, fevers, nausea or vomiting.    Objective:   Vitals:   11/15/23 2026 11/16/23 0437 11/16/23 0732 11/16/23 1252  BP:  (!) 112/59 (!) 101/54 107/64  Pulse:  64 62   Resp:  11 14 19   Temp:  98.2 F (36.8 C) 97.8 F  (36.6 C) 98.4 F (36.9 C)  TempSrc:  Oral Oral Oral  SpO2: 95% 98%  94%  Weight:      Height:        Intake/Output Summary (Last 24 hours) at 11/16/2023 1451 Last data  filed at 11/16/2023 1249 Gross per 24 hour  Intake 1240 ml  Output 2680 ml  Net -1440 ml     Wt Readings from Last 3 Encounters:  11/13/23 78.9 kg  11/02/23 80.7 kg  10/31/23 79.5 kg   Physical Exam General: Alert and oriented x 3, NAD Cardiovascular: S1 S2 clear, RRR.  Respiratory: Diminished sounds at the bases, no wheezing Gastrointestinal: Soft, nontender, nondistended, NBS Ext: no pedal edema bilaterally Neuro: no new deficits Psych: Normal affect    Data Reviewed:  I have personally reviewed following labs    CBC Lab Results  Component Value Date   WBC 5.2 11/16/2023   RBC 4.00 (L) 11/16/2023   HGB 10.9 (L) 11/16/2023   HCT 34.7 (L) 11/16/2023   MCV 86.8 11/16/2023   MCH 27.3 11/16/2023   PLT 207 11/16/2023   MCHC 31.4 11/16/2023   RDW 17.4 (H) 11/16/2023   LYMPHSABS 0.7 11/13/2023   MONOABS 0.7 11/13/2023   EOSABS 0.0 11/13/2023   BASOSABS 0.0 11/13/2023     Last metabolic panel Lab Results  Component Value Date   NA 137 11/16/2023   K 4.0 11/16/2023   CL 101 11/16/2023   CO2 30 11/16/2023   BUN 16 11/16/2023   CREATININE 1.22 11/16/2023   GLUCOSE 86 11/16/2023   GFRNONAA 58 (L) 11/16/2023   GFRAA >60 08/01/2018   CALCIUM 9.6 11/16/2023   PHOS 3.1 11/16/2023   PROT 5.9 (L) 11/14/2023   ALBUMIN 2.6 (L) 11/16/2023   BILITOT 0.8 11/14/2023   ALKPHOS 51 11/14/2023   AST 16 11/14/2023   ALT 10 11/14/2023   ANIONGAP 6 11/16/2023    CBG (last 3)  Recent Labs    11/14/23 0838 11/14/23 2116 11/15/23 0726  GLUCAP 93 92 91      Coagulation Profile: Recent Labs  Lab 11/13/23 1448  INR 1.5*     Radiology Studies: I have personally reviewed the imaging studies  No results found.      Thad Ranger M.D. [facility] 11/16/2023, 2:51 PM  Available via  Epic secure chat 7am-7pm After 7 pm, please refer to night coverage provider listed on amion.

## 2023-11-17 DIAGNOSIS — J189 Pneumonia, unspecified organism: Secondary | ICD-10-CM | POA: Diagnosis not present

## 2023-11-17 DIAGNOSIS — A419 Sepsis, unspecified organism: Secondary | ICD-10-CM | POA: Diagnosis not present

## 2023-11-17 DIAGNOSIS — I9589 Other hypotension: Secondary | ICD-10-CM | POA: Diagnosis not present

## 2023-11-17 LAB — CBC
HCT: 34.2 % — ABNORMAL LOW (ref 39.0–52.0)
Hemoglobin: 10.7 g/dL — ABNORMAL LOW (ref 13.0–17.0)
MCH: 27.1 pg (ref 26.0–34.0)
MCHC: 31.3 g/dL (ref 30.0–36.0)
MCV: 86.6 fL (ref 80.0–100.0)
Platelets: 213 10*3/uL (ref 150–400)
RBC: 3.95 MIL/uL — ABNORMAL LOW (ref 4.22–5.81)
RDW: 17.4 % — ABNORMAL HIGH (ref 11.5–15.5)
WBC: 4.4 10*3/uL (ref 4.0–10.5)
nRBC: 0 % (ref 0.0–0.2)

## 2023-11-17 LAB — RENAL FUNCTION PANEL
Albumin: 2.7 g/dL — ABNORMAL LOW (ref 3.5–5.0)
Anion gap: 7 (ref 5–15)
BUN: 15 mg/dL (ref 8–23)
CO2: 33 mmol/L — ABNORMAL HIGH (ref 22–32)
Calcium: 9.7 mg/dL (ref 8.9–10.3)
Chloride: 101 mmol/L (ref 98–111)
Creatinine, Ser: 1.26 mg/dL — ABNORMAL HIGH (ref 0.61–1.24)
GFR, Estimated: 56 mL/min — ABNORMAL LOW (ref >60)
Glucose, Bld: 89 mg/dL (ref 70–99)
Phosphorus: 3.4 mg/dL (ref 2.5–4.6)
Potassium: 3.6 mmol/L (ref 3.5–5.1)
Sodium: 141 mmol/L (ref 135–145)

## 2023-11-17 MED ORDER — AMOXICILLIN-POT CLAVULANATE 875-125 MG PO TABS: 1.0000 | ORAL_TABLET | Freq: Two times a day (BID) | ORAL | 0 refills | Status: AC

## 2023-11-17 NOTE — Progress Notes (Signed)
 Pharmacy Antibiotic Note  Joshua [name] is a 88 y.o. male admitted on 11/13/2023 and now being treated for  pneuomonia . Pharmacy has been consulted for vancomycin and cefepime dosing.   -WBC= 4.4, afebrile -SCr 1.2 (baseline ~ 1.1) -plans are to discontinue antibiotics after the last dose today  Plan: -Continue vancomycin to 1250 mg IV q24hr (estimated AUC 510) -Continue Cefepime 2gm IV q12h -Stop date established (3/31)    Height: 6\' 3"  (190.5 cm) Weight: 78.9 kg (174 lb) IBW/kg (Calculated) : 84.5  Temp (24hrs), Avg:98.2 F (36.8 C), Min:97.9 F (36.6 C), Max:98.4 F (36.9 C)  Recent Labs  Lab 11/13/23 1448 11/13/23 1516 11/13/23 1731 11/14/23 0407 11/15/23 0351 11/16/23 0423 11/17/23 0549  WBC 12.2*  --   --  8.1 5.4 5.2 4.4  CREATININE 1.80*  --   --  1.42* 1.24 1.22 1.26*  LATICACIDVEN  --  2.7* 0.8  --   --   --   --     Estimated Creatinine Clearance: 47.8 mL/min (A) (by C-G formula based on SCr of 1.26 mg/dL (H)).    Allergies  Allergen Reactions   Other Swelling    Beer - swelling    Sunflower Oil Swelling   Sulfa Antibiotics Rash    Antimicrobials this admission: Vancomycin 3/27 >> 3/31 Cefepime 3/27 >> 3/31  Dose adjustments this admission:  Microbiology results: 3/27 ZOX:WRUE  Thank you for allowing pharmacy to be a part of this patient's care.  Harland German, PharmD Clinical Pharmacist **Pharmacist phone directory can now be found on [email or web address] ([email or web address]).  Listed under [facility].

## 2023-11-17 NOTE — Progress Notes (Signed)
 Physical Therapy Treatment Patient Details Name: Joshua [name] MRN: [id number] DOB: 07-31-1936 Today's Date: 11/17/2023   History of Present Illness 88 yo male presented to ED 3/with 2 days of /malaise, fevers and chills.  EMS was called  and found to be hypoxic and was placed on 5 L O2 via [place]. Admitted for treatment of Acute on chronic respiratory failure with hypoxia, HCAP PMH:HTN, hyperlipidemia, GERD, diabetes mellitus, COPD, chronic respiratory failure on 2 L O2 via [place], chronic diastolic CHF, pulmonary hypertension, CTEPH, history of PE, gout, OSA, CKD stage IIIb, alcohol use presented with weakness and fevers.  Patient was admitted 10/25/2023-10/28/2023 with septic shock, left lower lobe pneumonia, acute on chronic respiratory failure, needed ICU and pressor support..    PT Comments  Patient progressing with activity tolerance able to ambulate and negotiate stairs with O2 @ 3LPM.  Educated on sitting to rest if  needed on  his steps at home and to utilize SpO2 monitor at home.  Agree with HHPT to resume at d/c.     If plan is discharge home, recommend the following: A little help with walking and/or transfers;A little help with bathing/dressing/bathroom;Help with stairs or ramp for entrance   Can travel by private vehicle        Equipment Recommendations  None recommended by PT    Recommendations for Other Services       Precautions / Restrictions Precautions Precautions: Fall Precaution/Restrictions Comments: watch SpO2     Mobility  Bed Mobility Overal bed mobility: Modified Independent                  Transfers Overall transfer level: Modified independent Equipment used: None Transfers: Sit to/from Stand Sit to Stand: Supervision                Ambulation/Gait Ambulation/Gait assistance: Supervision, Contact guard assist Gait Distance (Feet): 200 Feet Assistive device: None Gait Pattern/deviations: Step-through pattern, Decreased stride length,  Shuffle, Scissoring       General Gait Details: occasional scissoring though no LOB, notes scuffing his house slippers occasionally, CGA initially fading to S   Stairs Stairs: Yes Stairs assistance: Contact guard assist, Supervision Stair Management: Step to pattern, Forwards, Alternating pattern, One rail Right Number of Stairs: 10 General stair comments: up with step through pattern, down with step to and slowly, CGA for safety to ascend, S for descending PT carrying O2 tank   Wheelchair Mobility     Tilt Bed    Modified Rankin (Stroke Patients Only)       Balance Overall balance assessment: Needs assistance Sitting-balance support: Feet supported Sitting balance-Leahy Scale: Good Sitting balance - Comments: leans over to don his slippers     Standing balance-Leahy Scale: Good                              Communication Communication Communication: No apparent difficulties  Cognition Arousal: Alert Behavior During Therapy: WFL for tasks assessed/performed   PT - Cognitive impairments: No apparent impairments                         Following commands: Intact      Cueing    Exercises      General Comments General comments (skin integrity, edema, etc.): discussed rest on stairs if SOB notes had been on O2 already at home; see SpO2 note for qualification for home O2  Pertinent Vitals/Pain Pain Assessment Pain Assessment: No/denies pain    Home Living                          Prior Function            PT Goals (current goals can now be found in the care plan section) Progress towards PT goals: Progressing toward goals    Frequency    Min 2X/week      PT Plan      Co-evaluation              AM-PAC PT "6 Clicks" Mobility   Outcome Measure  Help needed turning from your back to your side while in a flat bed without using bedrails?: None Help needed moving from lying on your back to sitting on the  side of a flat bed without using bedrails?: None Help needed moving to and from a bed to a chair (including a wheelchair)?: None   Help needed to walk in hospital room?: A Little Help needed climbing 3-5 steps with a railing? : A Little 6 Click Score: 18    End of Session Equipment Utilized During Treatment: Gait belt;Oxygen Activity Tolerance: Patient tolerated treatment well Patient left: in bed;with call bell/phone within reach   PT Visit Diagnosis: Unsteadiness on feet (R26.81)     Time: 4098-1191 PT Time Calculation (min) (ACUTE ONLY): 24 min  Charges:    $Gait Training: 8-22 mins $Self Care/Home Management: 8-22 PT General Charges $$ ACUTE PT VISIT: 1 Visit                     Sheran Lawless, PT Acute Rehabilitation Services [phone] 11/17/2023    Elray Mcgregor 11/17/2023, 2:19 PM

## 2023-11-17 NOTE — TOC Initial Note (Addendum)
 Transition of Care [facility]) - Initial/Assessment Note    Patient Details  Name: Joshua [name] MRN: [id number] Date of Birth: 10-15-1935  Transition of Care [facility]) CM/SW Contact:    Gala Lewandowsky, RN Phone Number: 11/17/2023, 1:10 PM  Clinical Narrative: Risk for readmission assessment completed. PTA patient reports that he is from home with spouse. Patient has DME has RW x 2, canesx 2, oxygen via [employer] 2 Liters, [facility] and [place]. Per MD, patient is to have an ambulatory sat completed before discharge to verify liter flow. Case Manager did verify with [employer] above oxygen liter flow. Patient states spouse takes him to appointments and he gets his medications without any issues. Case Manager will continue to follow for additional needs as the patient progresses.        [employer] has received an updated order for 3 Liters. [employer] called the patient to see if needed portable tanks delivered to the hospital-family to bring tank and will transport home. No further needs identified at this time.             Expected Discharge Plan: Home w Home Health Services Barriers to Discharge: No Barriers Identified   Patient Goals and CMS Choice Patient states their goals for this hospitalization and ongoing recovery are:: plan to return home once stable.   Choice offered to / list presented to : NA    Expected Discharge Plan and Services   Discharge Planning Services: CM Consult Post Acute Care Choice: Home Health, Resumption of Svcs/PTA Provider Living arrangements for the past 2 months: Single Family Home                   DME Agency: NA  HH Arranged: RN, Disease Management, PT HH Agency: [facility] Date [facility] Agency Contacted: 11/17/23 Time HH Agency Contacted: 1245 Representative spoke with at [facility] Agency: Kandee Keen  Prior Living Arrangements/Services Living arrangements for the past 2 months: Single Family Home Lives with:: Spouse Patient language and need for interpreter  reviewed:: Yes Do you feel safe going back to the place where you live?: Yes      Need for Family Participation in Patient Care: Yes (Comment) Care giver support system in place?: Yes (comment) Current home services:  (has RW x 2, canesx 2, oxygen via [employer] 2 Liters, [facility] and [place].) Criminal Activity/Legal Involvement Pertinent to Current Situation/Hospitalization: No - Comment as needed  Activities of Daily Living   ADL Screening (condition at time of admission) Independently performs ADLs?: No Does the patient have a NEW difficulty with bathing/dressing/toileting/self-feeding that is expected to last >3 days?: No Does the patient have a NEW difficulty with getting in/out of bed, walking, or climbing stairs that is expected to last >3 days?: No Does the patient have a NEW difficulty with communication that is expected to last >3 days?: No Is the patient deaf or have difficulty hearing?: No Does the patient have difficulty seeing, even when wearing glasses/contacts?: No Does the patient have difficulty concentrating, remembering, or making decisions?: No  Permission Sought/Granted Permission sought to share information with : Family Supports, Magazine features editor, Case Estate manager/land agent granted to share information with : Yes, Verbal Permission Granted     Permission granted to share info w AGENCY: [employer] and [facility]        Emotional Assessment Appearance:: Appears stated age Attitude/Demeanor/Rapport: Engaged Affect (typically observed): Appropriate Orientation: : Oriented to Self, Oriented to Place, Oriented to  Time, Oriented to Situation Alcohol / Substance Use:  Not Applicable Psych Involvement: No (comment)  Admission diagnosis:  HCAP (healthcare-associated pneumonia) [J18.9] Pneumonia of left lung due to infectious organism, unspecified part of lung [J18.9] Sepsis, due to unspecified organism, unspecified whether acute organ dysfunction present [facility])  [A41.9] Patient Active Problem List   Diagnosis Date Noted   Pneumonia of left lung due to infectious organism 11/15/2023   Sepsis (HCC) 11/15/2023   HCAP (healthcare-associated pneumonia) 11/13/2023   DMII (diabetes mellitus, type 2) (HCC) 10/15/2023   DNR (do not resuscitate) discussion 10/10/2023   Chronic obstructive pulmonary disease with acute lower respiratory infection (HCC) 08/04/2023   CTEPH (chronic thromboembolic pulmonary hypertension) (HCC) 08/04/2023   Chronic cor pulmonale (HCC) 08/04/2023   Recurrent pneumonia 03/14/2023   Alcohol abuse 07/29/2022   Constipation 07/29/2022   Gastro-esophageal reflux disease with esophagitis, without bleeding 07/29/2022   Gastroduodenitis 07/29/2022   History of colonic polyps 07/29/2022   Hypotension 05/20/2022   Stage 3b chronic kidney disease (CKD) (HCC) - baseline SCr 1.5-1.8 05/20/2022   Chronic diastolic CHF (congestive heart failure) (HCC)    Protein-calorie malnutrition, severe (HCC) 12/26/2021   Palliative care by specialist    Abnormal gait 10/05/2021   Hiatal hernia 10/05/2021   Hiccoughs 10/05/2021   Pure hypercholesterolemia 10/05/2021   Gout 10/04/2021   Acute on chronic respiratory failure with hypoxia (HCC) 10/04/2021   Asymmetrical sensorineural hearing loss 08/29/2021   Nasal dryness 08/29/2021   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   Right heart failure with reduced right ventricular function (HCC) 04/12/2021   OSA (obstructive sleep apnea) 12/07/2020   Aortic atherosclerosis (HCC) 12/07/2020   Pseudophakia, both eyes 06/07/2019   Pulmonary hypertension (HCC) 09/29/2017   Benign prostatic hyperplasia with lower urinary tract symptoms 08/25/2017   Demand ischemia of myocardium (HCC) 08/25/2017   Arthritis 08/24/2017   Hypertension 02/16/2015   Personal history of pulmonary embolism 10/10/2014   Chronic respiratory failure with hypoxia (HCC) - on home O2 @ 2 L/min 10/10/2014   S/P knee replacement 09/13/2014   PCP:   [name], DO Pharmacy:   [facility] [phone] [place], [place] - [address] [place] [address] Phone: [phone] Fax: [phone]  [facility] [place], [place] - [address] [place] [address] Phone: [phone] Fax: [phone]  Social Drivers of Health (SDOH) Social History: SDOH Screenings   Food Insecurity: No Food Insecurity (11/13/2023)  Housing: Low Risk  (11/13/2023)  Transportation Needs: No Transportation Needs (11/13/2023)  Recent Concern: Transportation Needs - Unmet Transportation Needs (10/10/2023)  Utilities: Not At Risk (11/13/2023)  Depression (PHQ2-9): Low Risk  (03/13/2022)  Social Connections: Socially Integrated (11/13/2023)  Stress: No Stress Concern Present (05/09/2021)  Tobacco Use: Medium Risk (11/13/2023)   Readmission Risk Interventions    11/17/2023    1:08 PM 10/27/2023    2:18 PM 10/20/2023    1:41 PM  Readmission Risk Prevention Plan  Post Dischage Appt   Complete  Medication Screening   Complete  Transportation Screening Complete Complete Complete  PCP or Specialist Appt within 5-7 Days  Complete   Home Care Screening  Complete   Medication Review (RN CM)  Referral to Pharmacy   HRI or Home Care Consult Complete    Social Work Consult for Recovery Care Planning/Counseling Complete    Palliative Care Screening Not Applicable    Medication Review Oceanographer) Referral to Pharmacy

## 2023-11-17 NOTE — Progress Notes (Signed)
 2100: Pt for discharge today. Pt was wheeled down by NT with 02 @ 2lpm. Wife in the car w/ pts own oxygen. Discharge instructions and medications was handed over by day team to patient. Patient had no any other questions before going home. Peripheral IV removed.

## 2023-11-17 NOTE — Progress Notes (Signed)
 Mobility Specialist Progress Note;   11/17/23 0935  Mobility  Activity Ambulated with assistance to bathroom;Ambulated with assistance in room  Level of Assistance Contact guard assist, steadying assist  Assistive Device None  Distance Ambulated (ft) 7 ft  Activity Response Tolerated well  Mobility Referral Yes  Mobility visit 1 Mobility  Mobility Specialist Start Time (ACUTE ONLY) 0935  Mobility Specialist Stop Time (ACUTE ONLY) 0940  Mobility Specialist Time Calculation (min) (ACUTE ONLY) 5 min   Answered pts call bell from BR. Required MinG assistance to stand from toilet and ambulate in room back to bed safely. VSS on 3LO2. Pt left comfortably in bed with all needs met.   Caesar Bookman Mobility Specialist Please contact via SecureChat or [employer] [phone]

## 2023-11-17 NOTE — Progress Notes (Signed)
 Mobility Specialist Progress Note;   11/17/23 0912  Mobility  Activity Ambulated with assistance in hallway  Level of Assistance Contact guard assist, steadying assist  Assistive Device None  Distance Ambulated (ft) 400 ft  Activity Response Tolerated well  Mobility Referral Yes  Mobility visit 1 Mobility  Mobility Specialist Start Time (ACUTE ONLY) 0912  Mobility Specialist Stop Time (ACUTE ONLY) 0925  Mobility Specialist Time Calculation (min) (ACUTE ONLY) 13 min   Pt agreeable to mobility. On 3LO2 upon arrival. Required MinG assistance during ambulation for safety. Ambulated on 3LO2, VSS throughout. Took 1x standing rest break d/t slight fatigue. Pt requested assistance to BR at EOS. Pt left in BR with all needs met. Told to pull call bell once finished w/ BM.   Caesar Bookman Mobility Specialist Please contact via SecureChat or [employer] [phone]

## 2023-11-17 NOTE — Progress Notes (Signed)
 SATURATION QUALIFICATIONS: (This note is used to comply with regulatory documentation for home oxygen)  Patient Saturations on Room Air at Rest = 84%  Patient Saturations on Room Air while Ambulating = n/a%  Patient Saturations on 3 Liters of oxygen while Ambulating = 90%  Please briefly explain why patient needs home oxygen:  On Home O2 already hypoxic on RA with rest.  Kept on 3L with ambulation/stairs.  Sheran Lawless, PT Acute [facility] Services [phone] 11/17/2023

## 2023-11-17 NOTE — Progress Notes (Addendum)
 Provided patient with discharge instructions and patient verbalized understanding. PIV x 1 removed and patient stated he could dress himself.  Patient primary RN called into room shortly after and patient PIV site was bleeding.  Discharge RN was unaware patient needed afternoon scheduled antibiotics. Patient agreeable to restart new PIV and receive doses of antibiotics before discharge.  Patient called and updated his wife, she will return to pick him up this evening.  Patient home medications returned to him at discharge.

## 2023-11-17 NOTE — Care Management Important Message (Signed)
 Important Message  Patient Details  Name: TEREK BEE MRN: [id number] Date of Birth: September 10, 1935   Important Message Given:  Yes - Medicare IM     Sherilyn Banker 11/17/2023, 2:22 PM

## 2023-11-17 NOTE — Discharge Summary (Signed)
 Physician Discharge Summary   Patient: Joshua [name] MRN: [id number] DOB: 07/06/1936  Admit date:     11/13/2023  Discharge date: 11/17/23  Discharge Physician: Thad Ranger, MD    PCP: [name], DO   Recommendations at discharge:   Augmentin 875-125 mg twice daily for 2 more days to complete course for antibiotics for 7 days Recommend repeating CT chest or chest x-ray in 3 to 4 weeks to ensure complete resolution of pneumonia Qualified for home O2 3 L  Patient Saturations on Room Air at Rest = 84% Patient Saturations on Room Air while Ambulating = n/a% Patient Saturations on 3 Liters of oxygen while Ambulating = 90% Please briefly explain why patient needs home oxygen: Chronic cor pulmonale, CHF, COPD  On Home O2 already hypoxic on RA with rest.  Kept on 3L with ambulation/stairs.  Discharge Diagnoses:   Acute on chronic respiratory failure with hypoxia (HCC)   HCAP (healthcare-associated pneumonia)   Hypertension   Hypotension   Stage 3b chronic kidney disease (CKD) (HCC) - baseline SCr 1.5-1.8   DMII (diabetes mellitus, type 2) (HCC)   Pulmonary hypertension (HCC)   OSA (obstructive sleep apnea)   Gout   Pure hypercholesterolemia   Chronic diastolic CHF (congestive heart failure) (HCC)   Gastro-esophageal reflux disease with esophagitis, without bleeding   Chronic obstructive pulmonary disease with acute lower respiratory infection (HCC)   CTEPH (chronic thromboembolic pulmonary hypertension) (HCC)   Chronic cor pulmonale [facility])     Hospital Course:  Patient is a 88 year old male with HTN, hyperlipidemia, GERD, diabetes mellitus, COPD, chronic respiratory failure on 2 L O2 via [place], chronic diastolic CHF, pulmonary hypertension, CTEPH, history of PE, gout, OSA, CKD stage IIIb, alcohol use presented with weakness and fevers.  Patient was admitted 10/25/2023-10/28/2023 with septic shock, left lower lobe pneumonia, acute on chronic respiratory failure, needed ICU and  pressor support.. Patient now presented with 2 days of malaise, fevers and chills.  EMS was called and found to be hypoxic and was placed on 5 L O2 via [place].  BP was noted to be soft and received IV fluid bolus. Flu, COVID, RSV negative.  Chest x-ray consistent with COPD, pulmonary hypertension, patchy left-sided opacities consistent with pneumonia.  Patient was placed on vancomycin, cefepime, midodrine and fluids.    Assessment and Plan:  Acute on chronic respiratory failure with hypoxia, HCAP -Presented with 2 days of fevers, weakness, chills, chest x-ray.  PNA, initially placed on 5 L O2 in ED,  -Continue bronchodilators, midodrine for BP support. -Leukocytosis improved, 4.4 at discharge, lactic acidosis improved. -Urine strep antigen negative,, Legionella antigen negative -Reviewed prior chest x-ray, CT scans, has recurrent pneumonia on the left side, SLP evaluation done, mild aspiration risk -Has hiatal hernia, seen on prior CTA chest on 03/13/2023 -Placed on IV vancomycin, cefepime, completed 5 days today  -HRCT shows left upper lobe pneumonia, bilateral lower lobe dependent atelectasis, air-filled distended esophagus indicative of dysmotility, right adrenal adenoma, moderate hiatal hernia, aortic atherosclerosis, pulmonary arterial hypertension, emphysema    CKD stage IIIb -Presented with creatinine of 1.8, creatinine was 1.84 on 3/21 -Received IV fluids in ED, creatinine 1.4.  -Creatinine improved, 1.2     CTEPH with history of PE Pulmonary hypertension,  Chronic cor pulmonale with RV failure - Continue home Adempas - Continue home Eliquis, H&H stable 10.7 at discharge -Seen by cardiology during this admission   Chronic diastolic CHF with RV failure -2D echo on 10/11/2023 showed EF of 55% with severely  enlarged RV, severely reduced function -Cardiology following, resumed torsemide -Negative balance of 4.8 L   COPD -Known history of COPD with PFTs consistent with obstructive  lung disease and severely reduced DLCO -Continue bronchodilators   Hypotension -Continue midodrine, BP currently stable -Received IV fluids in ED   Chronic urinary retention - Continue home finasteride - May continue to self cath   Hyperlipidemia - Continue home atorvastatin   GERD - Continue home PPI   Diabetes mellitus type II -Hemoglobin A1c was 5.5 on 10/10/2023, controlled, patient on Farxiga    OSA - Not on CPAP   Gout - Continue home allopurinol    Generalized debility -PT evaluation recommended home health PT, -Home O2 evaluation prior to discharge, qualified for 3 L O2 via [place].     Estimated body mass index is 21.75 kg/m as calculated from the following:   Height as of this encounter: 6\' 3"  (1.905 m).   Weight as of this encounter: 78.9 kg.         Pain control - [employer] Controlled Substance Reporting System database was reviewed. and patient was instructed, not to drive, operate heavy machinery, perform activities at heights, swimming or participation in water activities or provide baby-sitting services while on Pain, Sleep and Anxiety Medications; until their outpatient Physician has advised to do so again. Also recommended to not to take more than prescribed Pain, Sleep and Anxiety Medications.  Consultants: Cardiology Procedures performed:   Disposition: Home Diet recommendation:  Discharge Diet Orders (From admission, onward)     Start     Ordered   11/17/23 0000  Diet - low sodium heart healthy        11/17/23 1420            DISCHARGE MEDICATION: Allergies as of 11/17/2023       Reactions   Other Swelling   Beer - swelling    Sunflower Oil Swelling   Sulfa Antibiotics Rash        Medication List     TAKE these medications    Adempas 2 MG Tabs Generic drug: Riociguat TAKE 1 TABLET BY MOUTH 3 TIMES A DAY AS DIRECTED. DO NOT HANDLE IF PREGNANT   albuterol 108 (90 Base) MCG/ACT inhaler Commonly known as: VENTOLIN  HFA Inhale 1 puff into the lungs every 4 (four) hours as needed for shortness of breath.   allopurinol 100 MG tablet Commonly known as: ZYLOPRIM Take 100 mg by mouth every morning.   amoxicillin-clavulanate 875-125 MG tablet Commonly known as: AUGMENTIN Take 1 tablet by mouth 2 (two) times daily for 2 days. Start taking on: November 18, 2023   atorvastatin 40 MG tablet Commonly known as: LIPITOR TAKE 1 TABLET BY MOUTH EVERY DAY   azelastine 0.1 % nasal spray Commonly known as: ASTELIN Place 2 sprays into both nostrils 2 (two) times daily as needed for rhinitis. Use in each nostril as directed   CINNAMON PO Take 1 capsule by mouth daily.   dapagliflozin propanediol 10 MG Tabs tablet Commonly known as: Farxiga Take 1 tablet (10 mg total) by mouth daily before breakfast.   Eliquis 5 MG Tabs tablet Generic drug: apixaban TAKE 1 TABLET BY MOUTH TWICE A DAY   finasteride 5 MG tablet Commonly known as: PROSCAR Take 5 mg by mouth daily.   gabapentin 300 MG capsule Commonly known as: NEURONTIN Take 300 mg by mouth 3 (three) times daily.   magnesium oxide 400 (240 Mg) MG tablet Commonly known as: MAG-OX TAKE  1 TABLET BY MOUTH EVERY DAY   metoCLOPramide 5 MG tablet Commonly known as: REGLAN Take 5 mg by mouth 3 (three) times daily with meals as needed for nausea or vomiting.   midodrine 5 MG tablet Commonly known as: PROAMATINE Take 3 tablets (15 mg total) by mouth 3 (three) times daily with meals.   multivitamin with minerals Tabs tablet Take 1 tablet by mouth daily.   OXYGEN Inhale 2 L/min into the lungs continuous.   pantoprazole 40 MG tablet Commonly known as: PROTONIX Take 1 tablet (40 mg total) by mouth every morning.   potassium chloride 10 MEQ tablet Commonly known as: KLOR-CON Take 10 mEq by mouth 2 (two) times daily.   Stiolto Respimat 2.5-2.5 MCG/ACT Aers Generic drug: Tiotropium Bromide-Olodaterol Inhale 2 puffs into the lungs daily.   torsemide 10 MG  tablet Commonly known as: DEMADEX Take 10 mg by mouth daily.               Durable Medical Equipment  (From admission, onward)           Start     Ordered   11/17/23 1417  For home use only DME oxygen  Once       Question Answer Comment  Length of Need 12 Months   Mode or (Route) Nasal cannula   Liters per Minute 3   Frequency Continuous (stationary and portable oxygen unit needed)   Oxygen conserving device Yes   Oxygen delivery system Gas      11/17/23 1416            Follow-up Information     [facility], [facility] Follow up.   Specialty: Home Health Services Why: Registered Nurse and Physical Therapy-office to call with visit times. Contact information: [address] [place] [address] [phone]         [name], DO. Schedule an appointment as soon as possible for a visit in 2 week(s).   Specialty: Family Medicine Why: for hospital follow-up, obtain follow-up chest x-ray or CT scan in 3-4 weeks to ensure resolution of pneumonia Contact information: [address] [place] [address] [place] [address] [phone]                Discharge Exam: Ceasar Mons Weights   11/13/23 1504  Weight: 78.9 kg   S: No acute complaints, completing antibiotics today, looking forward to the discharge home today.  No fevers or chills or cough  BP 134/76 (BP Location: Right Arm)   Pulse 66   Temp 97.7 F (36.5 C) (Oral)   Resp 18   Ht 6\' 3"  (1.905 m)   Wt 78.9 kg   SpO2 96%   BMI 21.75 kg/m   Physical Exam General: Alert and oriented x 3, NAD Cardiovascular: S1 S2 clear, RRR.  Respiratory: Diminished breath sound at the bases otherwise clear, no wheezing Gastrointestinal: Soft, nontender, nondistended, NBS Ext: no pedal edema bilaterally Neuro: no new deficits Psych: Normal affect    Condition at discharge: fair  The results of significant diagnostics from this hospitalization (including imaging, microbiology, ancillary  and laboratory) are listed below for reference.   Imaging Studies: CT Chest High Resolution Result Date: 11/17/2023 CLINICAL DATA:  Recurrent pneumonia failing outpatient therapy. EXAM: CT CHEST WITHOUT CONTRAST TECHNIQUE: Multidetector CT imaging of the chest was performed following the standard protocol without intravenous contrast. High resolution imaging of the lungs, as well as inspiratory and expiratory imaging, was performed. RADIATION DOSE REDUCTION: This exam was  performed according to the departmental dose-optimization program which includes automated exposure control, adjustment of the mA and/or kV according to patient size and/or use of iterative reconstruction technique. COMPARISON:  04/25/2023 03/13/2023. FINDINGS: Cardiovascular: Atherosclerotic calcification of the aorta, aortic valve and coronary arteries. Enlarged pulmonic trunk and heart. No pericardial effusion. Mediastinum/Nodes: Mediastinal lymph nodes are not enlarged by CT size criteria. Hilar regions are difficult to definitively evaluate without IV contrast. No axillary adenopathy. Esophagus is distended with air. Lungs/Pleura: Centrilobular emphysema. Image quality is degraded by respiratory motion and expiratory phase imaging. New consolidation in the posterior left upper lobe and lingula. Dependent atelectasis bilaterally. No pleural fluid. Airway is otherwise unremarkable. Upper Abdomen: 2.4 cm right adrenal nodule measures -1 Hounsfield unit. Slight nodular thickening of the left adrenal gland. No specific follow-up necessary. Moderate hiatal hernia. Cholecystectomy. Visualized portions of the liver, adrenal glands, kidneys, spleen, pancreas, stomach and bowel are otherwise grossly unremarkable. No upper abdominal adenopathy. Musculoskeletal: Degenerative changes in the spine. IMPRESSION: 1. Left upper lobe pneumonia. 2. Bilateral lower lobe dependent atelectasis. 3. Air-filled distended esophagus, indicative of dysmotility. 4.  Right adrenal adenoma. 5. Moderate hiatal hernia. 6. Aortic atherosclerosis (ICD10-I70.0). Coronary artery calcification. 7. Enlarged pulmonic trunk, indicative of pulmonary arterial hypertension. 8.  Emphysema (ICD10-J43.9). Electronically Signed   By: Leanna Battles M.D.   On: 11/17/2023 10:16   DG Chest Port 1 View Result Date: 11/13/2023 CLINICAL DATA:  Questionable sepsis - evaluate for abnormality EXAM: PORTABLE CHEST 1 VIEW COMPARISON:  Chest x-ray 11/02/2023.  Chest CT 04/25/2023 FINDINGS: There is hyperinflation of the lungs compatible with COPD. Heart and mediastinal contours are within normal limits. Prominence of the central pulmonary vessels could reflect pulmonary arterial hypertension as described on prior CT. Focal patchy left mid and lower lung airspace opacities are similar to prior study and could reflect pneumonia. No confluent opacity on the right. No effusions or acute bony abnormality. IMPRESSION: COPD. Prominent pulmonary vessels likely reflects pulmonary arterial hypertension. Patchy left mid and lower lung airspace opacities concerning for pneumonia, similar to prior study. Electronically Signed   By: Charlett Nose M.D.   On: 11/13/2023 14:47   DG Chest Port 1 View Result Date: 11/02/2023 CLINICAL DATA:  Near syncope, short of breath EXAM: PORTABLE CHEST 1 VIEW COMPARISON:  10/25/2023 FINDINGS: 2 frontal views of the chest demonstrate an unremarkable cardiac silhouette. Moderate improvement in the left basilar consolidation seen previously, most pronounced in the retrocardiac region. No effusion or pneumothorax. No acute bony abnormalities. IMPRESSION: 1. Persistent but improving left basilar airspace disease, which may reflect infection or aspiration. Electronically Signed   By: Sharlet Salina M.D.   On: 11/02/2023 17:50   DG Swallowing Func-Speech Pathology Result Date: 10/26/2023 Table formatting from the original result was not included. Modified Barium Swallow Study Patient  Details Name: MARIA GALLICCHIO MRN: [id number] Date of Birth: [birth year] Today's Date: 10/26/2023 HPI/PMH: HPI: Joshua [name] is a 88 y.o. male with PMH significant for COPD on 2 L O2 at home, pulmonary hypertension, CHF, chronic PE on Eliquis, CKD, chronic hypotension on midodrine, HLD, OSA, hiatal hernia, BPH, chronic urinary retention, lives at home with wife, and patient, able to ambulate independent. 3/8, patient was brought to the ED from home with complaint of chills, nausea, generalized weakness for a day.    Recently hospitalized 2/21 to 3/3 for septic shock secondary to bilateral pneumonia, suspected aspiration and ultimately discharged home, prior to completion of MBS which was recommended at that time. Chest x-ray  read as worsening left lower lobe infiltrate. Pateint seen by SLP services at bedside in 2025 and 2022, both without overt s/s of aspiration. Clinical Impression: Clinical Impression: Patient presents with a mild pharyngeal phase dysphagia characterized by delayed swallow initiation and decreased laryngeal closure resulting in penetration of viscosities thinner than honey thick. Both thin and nectar thick liquids penetrated, with thin liquid penetrating to the level of the vocal cords without observed evidence of sensation. Chin tuck and supraglottic swallow did not assist to decrease. Penetration episodes decreased in quantity and frequency with use of small single cup sips. Throat clear and dry swallow was effective to clear the airway before aspiraiton observed. Recommend the diet below with close aherence to swallowing precautions.SLP will f/u for education and reinforced use of precautions. Factors that may increase risk of adverse event in presence of aspiration Rubye Oaks & Clearance Coots 2021): Factors that may increase risk of adverse event in presence of aspiration Rubye Oaks & Clearance Coots 2021): Poor general health and/or compromised immunity; Respiratory or GI disease Recommendations/Plan: Swallowing  Evaluation Recommendations Swallowing Evaluation Recommendations Recommendations: PO diet PO Diet Recommendation: Regular; Thin liquids (Level 0) Liquid Administration via: Cup; No straw Medication Administration: Whole meds with puree Supervision: Patient able to self-feed; Full supervision/cueing for swallowing strategies Swallowing strategies  : Slow rate; Small bites/sips; Clear throat intermittently Postural changes: Position pt fully upright for meals Oral care recommendations: Oral care BID (2x/day) Treatment Plan Treatment Plan Treatment recommendations: Therapy as outlined in treatment plan below Follow-up recommendations: Outpatient SLP Functional status assessment: Patient has had a recent decline in their functional status and demonstrates the ability to make significant improvements in function in a reasonable and predictable amount of time. Treatment frequency: Min 1x/week Treatment duration: 1 week Interventions: Aspiration precaution training; Compensatory techniques; Patient/family education; Trials of upgraded texture/liquids; Diet toleration management by SLP Recommendations Recommendations for follow up therapy are one component of a multi-disciplinary discharge planning process, led by the attending physician.  Recommendations may be updated based on patient status, additional functional criteria and insurance authorization. Assessment: Orofacial Exam: Orofacial Exam Oral Cavity: Oral Hygiene: WFL Oral Cavity - Dentition: Adequate natural dentition Orofacial Anatomy: WFL Oral Motor/Sensory Function: WFL Anatomy: Anatomy: WFL Boluses Administered: Boluses Administered Boluses Administered: Thin liquids (Level 0); Mildly thick liquids (Level 2, nectar thick); Moderately thick liquids (Level 3, honey thick); Puree; Solid  Oral Impairment Domain: Oral Impairment Domain Lip Closure: No labial escape Tongue control during bolus hold: Cohesive bolus between tongue to palatal seal Bolus  preparation/mastication: Timely and efficient chewing and mashing Bolus transport/lingual motion: Brisk tongue motion Oral residue: Trace residue lining oral structures Location of oral residue : Tongue Initiation of pharyngeal swallow : Pyriform sinuses  Pharyngeal Impairment Domain: Pharyngeal Impairment Domain Soft palate elevation: No bolus between soft palate (SP)/pharyngeal wall (PW) Laryngeal elevation: Partial superior movement of thyroid cartilage/partial approximation of arytenoids to epiglottic petiole Anterior hyoid excursion: Partial anterior movement Epiglottic movement: Complete inversion Laryngeal vestibule closure: None, wide column air/contrast in laryngeal vestibule Pharyngeal stripping wave : Present - complete Pharyngeal contraction (A/P view only): N/A Pharyngoesophageal segment opening: Complete distension and complete duration, no obstruction of flow Tongue base retraction: Trace column of contrast or air between tongue base and PPW Pharyngeal residue: Trace residue within or on pharyngeal structures Location of pharyngeal residue: Valleculae; Tongue base  Esophageal Impairment Domain: Esophageal Impairment Domain Esophageal clearance upright position: Complete clearance, esophageal coating Pill: Pill Consistency administered: Puree Puree: WFL Penetration/Aspiration Scale Score: Penetration/Aspiration Scale Score 1.  Material does not enter airway: Moderately thick liquids (Level 3, honey thick); Puree; Solid; Pill 3.  Material enters airway, remains ABOVE vocal cords and not ejected out: Moderately thick liquids (Level 3, honey thick); Mildly thick liquids (Level 2, nectar thick) 5.  Material enters airway, CONTACTS cords and not ejected out: Thin liquids (Level 0) Compensatory Strategies: Compensatory Strategies Compensatory strategies: Yes Chin tuck: Ineffective Supraglottic swallow: Ineffective   General Information: Caregiver present: No  Diet Prior to this Study: NPO   Temperature :  Normal   Respiratory Status: WFL   Supplemental O2: Nasal cannula   History of Recent Intubation: No  Behavior/Cognition: Alert; Cooperative; Pleasant mood Self-Feeding Abilities: Able to self-feed Baseline vocal quality/speech: Dysphonic Volitional Cough: Able to elicit Volitional Swallow: Able to elicit No data recorded Goal Planning: Prognosis for improved oropharyngeal function: Guarded Barriers to Reach Goals: Time post onset No data recorded No data recorded Consulted and agree with results and recommendations: Patient; Nurse Pain: Pain Assessment Pain Assessment: No/denies pain End of Session: Start Time:SLP Start Time (ACUTE ONLY): 1220 Stop Time: SLP Stop Time (ACUTE ONLY): 1240 Time Calculation:SLP Time Calculation (min) (ACUTE ONLY): 20 min Charges: SLP Evaluations $ SLP Speech Visit: 1 Visit SLP Evaluations $MBS Swallow: 1 Procedure SLP visit diagnosis: SLP Visit Diagnosis: Dysphagia, pharyngeal phase (R13.13) Past Medical History: Past Medical History: Diagnosis Date  Arthritis   BPH (benign prostatic hyperplasia)   CHF (congestive heart failure) (HCC)   Chronic hiccups   COPD (chronic obstructive pulmonary disease) (HCC)   Folliculitis   posterior scalp per office visit note of Dr Selena Batten 07/20/2014    GERD (gastroesophageal reflux disease)   Gout   Hypertension   Hypotension   NSTEMI (non-ST elevated myocardial infarction) (HCC) 03/19/2022  PE (pulmonary thromboembolism) (HCC)   Phlebitis   right arm  at least 20 years ago   Pneumonia   hx of pneumonia as a child   Pulmonary embolism (HCC) 03/17/2022  Pulmonary hypertension (HCC)   Severe sepsis (HCC) 03/16/2022  Sleep apnea  Past Surgical History: Past Surgical History: Procedure Laterality Date  BIOPSY  11/03/2020  Procedure: BIOPSY;  Surgeon: Jeani Hawking, MD;  Location: [facility];  Service: Endoscopy;;  BIOPSY  07/06/2021  Procedure: BIOPSY;  Surgeon: Jeani Hawking, MD;  Location: [facility];  Service: Endoscopy;;  bone removed from little toe  right foot     CHOLECYSTECTOMY    ESOPHAGOGASTRODUODENOSCOPY (EGD) WITH PROPOFOL N/A 11/03/2020  Procedure: ESOPHAGOGASTRODUODENOSCOPY (EGD) WITH PROPOFOL;  Surgeon: Jeani Hawking, MD;  Location: [facility];  Service: Endoscopy;  Laterality: N/A;  ESOPHAGOGASTRODUODENOSCOPY (EGD) WITH PROPOFOL N/A 07/06/2021  Procedure: ESOPHAGOGASTRODUODENOSCOPY (EGD) WITH PROPOFOL;  Surgeon: Jeani Hawking, MD;  Location: [facility];  Service: Endoscopy;  Laterality: N/A;  pilonidal cyst removal     RIGHT HEART CATH N/A 12/13/2020  Procedure: RIGHT HEART CATH;  Surgeon: Laurey Morale, MD;  Location: [facility] INVASIVE CV LAB;  Service: Cardiovascular;  Laterality: N/A;  RIGHT/LEFT HEART CATH AND CORONARY ANGIOGRAPHY N/A 03/21/2022  Procedure: RIGHT/LEFT HEART CATH AND CORONARY ANGIOGRAPHY;  Surgeon: Laurey Morale, MD;  Location: [facility] INVASIVE CV LAB;  Service: Cardiovascular;  Laterality: N/A;  TOTAL KNEE ARTHROPLASTY Left 09/13/2014  Procedure: LEFT TOTAL KNEE ARTHROPLASTY;  Surgeon: Shelda Pal, MD;  Location: [facility];  Service: Orthopedics;  Laterality: Left; Ferdinand Lango MA, CCC-SLP McCoy Leah Meryl 10/26/2023, 1:13 PM  DG Chest Portable 1 View Result Date: 10/25/2023 CLINICAL DATA:  infection EXAM: PORTABLE CHEST 1 VIEW COMPARISON:  October 13, 2023,  October 10, 2023 FINDINGS: The cardiomediastinal silhouette is unchanged in contour.Atherosclerotic calcifications. No pleural effusion. No pneumothorax. Patchy predominately LEFT-sided airspace opacities, mildly increased in comparison to prior. RIGHT basilar reticular nodularity is mildly improved. IMPRESSION: 1. Mildly increased patchy LEFT-sided airspace opacities. Differential considerations include aspiration atelectasis or infection. Recommend follow-up PA and lateral chest radiograph in 4-6 weeks to assess for resolution. 2. Mildly improved RIGHT basilar reticular nodularity. Electronically Signed   By: Meda Klinefelter M.D.   On: 10/25/2023 13:02     Microbiology: Results for orders placed or performed during the hospital encounter of 11/13/23  Blood Culture (routine x 2)     Status: None (Preliminary result)   Collection Time: 11/13/23  2:48 PM   Specimen: BLOOD RIGHT WRIST  Result Value Ref Range Status   Specimen Description BLOOD RIGHT WRIST  Final   Special Requests   Final    BOTTLES DRAWN AEROBIC AND ANAEROBIC Blood Culture results may not be optimal due to an inadequate volume of blood received in culture bottles   Culture   Final    NO GROWTH 4 DAYS Performed at [facility], [address] [place], [place] [address]    Report Status PENDING  Incomplete  Resp panel by RT-PCR (RSV, Flu A&B, Covid)     Status: None   Collection Time: 11/13/23  2:48 PM   Specimen: Nasal Swab  Result Value Ref Range Status   SARS Coronavirus 2 by RT PCR NEGATIVE NEGATIVE Final   Influenza A by PCR NEGATIVE NEGATIVE Final   Influenza B by PCR NEGATIVE NEGATIVE Final    Comment: (NOTE) The Xpert Xpress SARS-CoV-2/FLU/RSV plus assay is intended as an aid in the diagnosis of influenza from Nasopharyngeal swab specimens and should not be used as a sole basis for treatment. Nasal washings and aspirates are unacceptable for Xpert Xpress SARS-CoV-2/FLU/RSV testing.  Fact Sheet for Patients: BloggerCourse.com  Fact Sheet for Healthcare Providers: SeriousBroker.it  This test is not yet approved or cleared by the [place] FDA and has been authorized for detection and/or diagnosis of SARS-CoV-2 by FDA under an Emergency Use Authorization (EUA). This EUA will remain in effect (meaning this test can be used) for the duration of the COVID-19 declaration under Section 564(b)(1) of the Act, 21 U.S.C. section 360bbb-3(b)(1), unless the authorization is terminated or revoked.     Resp Syncytial Virus by PCR NEGATIVE NEGATIVE Final    Comment: (NOTE) Fact Sheet for  Patients: BloggerCourse.com  Fact Sheet for Healthcare Providers: SeriousBroker.it  This test is not yet approved or cleared by the [place] FDA and has been authorized for detection and/or diagnosis of SARS-CoV-2 by FDA under an Emergency Use Authorization (EUA). This EUA will remain in effect (meaning this test can be used) for the duration of the COVID-19 declaration under Section 564(b)(1) of the Act, 21 U.S.C. section 360bbb-3(b)(1), unless the authorization is terminated or revoked.  Performed at [facility], [address] [place], [place] [address]   Blood Culture (routine x 2)     Status: None (Preliminary result)   Collection Time: 11/13/23 10:03 PM   Specimen: BLOOD LEFT HAND  Result Value Ref Range Status   Specimen Description BLOOD LEFT HAND  Final   Special Requests   Final    BOTTLES DRAWN AEROBIC AND ANAEROBIC Blood Culture results may not be optimal due to an inadequate volume of blood received in culture bottles   Culture   Final  NO GROWTH 4 DAYS Performed at [facility], [address] [place], [place] [address]    Report Status PENDING  Incomplete  MRSA Next Gen by PCR, Nasal     Status: None   Collection Time: 11/14/23 11:08 AM   Specimen: Nasal Mucosa; Nasal Swab  Result Value Ref Range Status   MRSA by PCR Next Gen NOT DETECTED NOT DETECTED Final    Comment: (NOTE) The GeneXpert MRSA Assay (FDA approved for NASAL specimens only), is one component of a comprehensive MRSA colonization surveillance program. It is not intended to diagnose MRSA infection nor to guide or monitor treatment for MRSA infections. Test performance is not FDA approved in patients less than 33 years old. Performed at [facility], [address], [place], [place] [address]     Labs: CBC: Recent Labs  Lab 11/13/23 1448 11/14/23 0407 11/15/23 0351 11/16/23 0423 11/17/23 0549  WBC 12.2* 8.1 5.4  5.2 4.4  NEUTROABS 10.7*  --   --   --   --   HGB 11.8* 10.7* 10.5* 10.9* 10.7*  HCT 38.3* 34.2* 33.8* 34.7* 34.2*  MCV 88.9 87.2 87.3 86.8 86.6  PLT 274 204 193 207 213   Basic Metabolic Panel: Recent Labs  Lab 11/13/23 1448 11/14/23 0407 11/15/23 0351 11/16/23 0423 11/17/23 0549  NA 140 139 140 137 141  K 4.7 3.9 3.9 4.0 3.6  CL 97* 103 102 101 101  CO2 33* 32 31 30 33*  GLUCOSE 128* 83 92 86 89  BUN 23 22 18 16 15   CREATININE 1.80* 1.42* 1.24 1.22 1.26*  CALCIUM 10.0 9.0 9.7 9.6 9.7  PHOS  --   --  3.1 3.1 3.4   Liver Function Tests: Recent Labs  Lab 11/13/23 1448 11/14/23 0407 11/15/23 0351 11/16/23 0423 11/17/23 0549  AST 40 16  --   --   --   ALT 18 10  --   --   --   ALKPHOS 81 51  --   --   --   BILITOT 1.2 0.8  --   --   --   PROT 7.5 5.9*  --   --   --   ALBUMIN 3.5 2.7* 2.6* 2.6* 2.7*   CBG: Recent Labs  Lab 11/13/23 2204 11/14/23 0838 11/14/23 2116 11/15/23 0726  GLUCAP 89 93 92 91    Discharge time spent: greater than 30 minutes.  Signed: Thad Ranger, MD [facility] 11/17/2023

## 2023-11-18 ENCOUNTER — Telehealth: Payer: Self-pay

## 2023-11-18 ENCOUNTER — Other Ambulatory Visit: Payer: Self-pay | Admitting: Cardiology

## 2023-11-18 LAB — CULTURE, BLOOD (ROUTINE X 2)
Culture: NO GROWTH
Culture: NO GROWTH

## 2023-11-18 NOTE — Transitions of Care (Post Inpatient/ED Visit) (Signed)
 11/18/2023  Name: Joshua [name] MRN: [id number] DOB: 11/14/1935  Today's TOC FU Call Status: Today's TOC FU Call Status:: Successful TOC FU Call Completed TOC FU Call Complete Date: 11/18/23 Patient's Name and Date of Birth confirmed.  Transition Care Management Follow-up Telephone Call Date of Discharge: 11/17/23 Discharge Facility: [facility]) Type of Discharge: Inpatient Admission Primary Inpatient Discharge Diagnosis:: Pneumonia How have you been since you were released from the hospital?: Better (This is my second go around) Any questions or concerns?: No  Items Reviewed: Did you receive and understand the discharge instructions provided?: Yes Medications obtained,verified, and reconciled?: Yes (Medications Reviewed) Any new allergies since your discharge?: No Dietary orders reviewed?: NA Do you have support at home?: Yes People in Home: spouse Name of Support/Comfort Primary Source: Randa Evens  Medications Reviewed Today: Medications Reviewed Today     Reviewed by Redge Gainer, RN (Case Manager) on 11/18/23 at 1259  Med List Status: <None>   Medication Order Taking? Sig Documenting Provider Last Dose Status Informant  ADEMPAS 2 MG TABS [id number] No TAKE 1 TABLET BY MOUTH 3 TIMES A DAY AS DIRECTED. DO NOT HANDLE IF PREGNANT Laurey Morale, MD 11/13/2023 Noon Active Spouse/Significant Other, Pharmacy Records, Self           Med Note [facility], Batoul Limes   Tue Nov 18, 2023 12:25 PM)    albuterol (VENTOLIN HFA) 108 (90 Base) MCG/ACT inhaler [id number] No Inhale 1 puff into the lungs every 4 (four) hours as needed for shortness of breath. [provider] Unknown Active Spouse/Significant Other, Pharmacy Records, Self  allopurinol (ZYLOPRIM) 100 MG tablet [id number] No Take 100 mg by mouth every morning. [provider] 11/13/2023 Active Spouse/Significant Other, Pharmacy Records, Self  amoxicillin-clavulanate (AUGMENTIN) 875-125 MG tablet [id number]  Take 1  tablet by mouth 2 (two) times daily for 2 days. Cathren Harsh, MD  Active   atorvastatin (LIPITOR) 40 MG tablet [id number] No TAKE 1 TABLET BY MOUTH EVERY DAY Jacklynn Ganong, [place] 11/13/2023 Morning Active Spouse/Significant Other, Pharmacy Records, Self  azelastine (ASTELIN) 0.1 % nasal spray [id number] No Place 2 sprays into both nostrils 2 (two) times daily as needed for rhinitis. Use in each nostril as directed [provider] Unknown Active Spouse/Significant Other, Pharmacy Records, Self  CINNAMON PO [id number] No Take 1 capsule by mouth daily. [provider] 11/13/2023 Morning Active Spouse/Significant Other, Self, Pharmacy Records  dapagliflozin propanediol (FARXIGA) 10 MG TABS tablet [id number] No Take 1 tablet (10 mg total) by mouth daily before breakfast. Jacklynn Ganong, [place] 11/13/2023 Morning Active Spouse/Significant Other, Pharmacy Records, Self  ELIQUIS 5 MG TABS tablet [id number] No TAKE 1 TABLET BY MOUTH TWICE A DAY Laurey Morale, MD 11/13/2023  8:00 AM Active Spouse/Significant Other, Pharmacy Records, Self  finasteride (PROSCAR) 5 MG tablet [id number] No Take 5 mg by mouth daily. [provider] 11/13/2023 Morning Active Spouse/Significant Other, Pharmacy Records, Self  gabapentin (NEURONTIN) 300 MG capsule [id number] No Take 300 mg by mouth 3 (three) times daily. [provider] 11/13/2023 Noon Active Spouse/Significant Other, Pharmacy Records, Self  magnesium oxide (MAG-OX) 400 (240 Mg) MG tablet [id number] No TAKE 1 TABLET BY MOUTH EVERY DAY Laurey Morale, MD 11/13/2023 Morning Active Spouse/Significant Other, Pharmacy Records, Self  metoCLOPramide (REGLAN) 5 MG tablet [id number] No Take 5 mg by mouth 3 (three) times daily with meals as needed for nausea or vomiting. [provider] Past Month Active Spouse/Significant Other, Pharmacy Records, Self  midodrine (PROAMATINE) 5 MG  tablet [id number] No Take 3 tablets (15 mg total) by mouth 3  (three) times daily with meals. Prince Rome [place], [place] 11/13/2023 Noon Active Spouse/Significant Other, Self, Pharmacy Records  Multiple Vitamin (MULTIVITAMIN WITH MINERALS) TABS tablet [id number] No Take 1 tablet by mouth daily. Marguerita Merles [place], [place] 11/13/2023 Morning Active Spouse/Significant Other, Pharmacy Records, Self  OXYGEN [id number] No Inhale 3 L/min into the lungs continuous. [provider] 11/13/2023 Morning Active Spouse/Significant Other, Pharmacy Records, Self  pantoprazole (PROTONIX) 40 MG tablet [id number] No Take 1 tablet (40 mg total) by mouth every morning. Noemi Chapel, NP 11/13/2023 Morning Active Spouse/Significant Other, Pharmacy Records, Self  potassium chloride (KLOR-CON) 10 MEQ tablet [id number] No Take 10 mEq by mouth 2 (two) times daily. [provider] 11/13/2023 Morning Active Spouse/Significant Other, Pharmacy Records, Self           Med Note (COFFELL, ANGELA M   Thu Nov 13, 2023  6:24 PM) No fill history found, pt insists he is still taking BID.  Tiotropium Bromide-Olodaterol (STIOLTO RESPIMAT) 2.5-2.5 MCG/ACT AERS [id number] No Inhale 2 puffs into the lungs daily. Oretha Milch, MD 11/13/2023 Morning Active Spouse/Significant Other, Pharmacy Records, Self  torsemide (DEMADEX) 10 MG tablet [id number] No Take 10 mg by mouth daily. [provider] 11/13/2023 Morning Active Spouse/Significant Other, Self, Pharmacy Records            Home Care and Equipment/Supplies: Were Home Health Services Ordered?: Yes Name of Home Health Agency:: [facility] Has Agency set up a time to come to your home?: No EMR reviewed for Home Health Orders: Orders present/patient has not received call (refer to CM for follow-up) Any new equipment or medical supplies ordered?: No  Functional Questionnaire: Do you need assistance with bathing/showering or dressing?: No Do you need assistance with meal preparation?: No Do you need assistance with eating?: No Do you  have difficulty maintaining continence: No Do you need assistance with getting out of bed/getting out of a chair/moving?: No Do you have difficulty managing or taking your medications?: No  Follow up appointments reviewed: PCP Follow-up appointment confirmed?: No (The patient will call) MD Provider Line Number:[phone] Given: No Specialist Hospital Follow-up appointment confirmed?: NA Do you understand care options if your condition(s) worsen?: Yes-patient verbalized understanding  SDOH Interventions Today    Flowsheet Row Most Recent Value  SDOH Interventions   Food Insecurity Interventions Intervention Not Indicated  Housing Interventions Intervention Not Indicated  Transportation Interventions Intervention Not Indicated  Utilities Interventions Intervention Not Indicated      The patient had been participating in the [facility] 30 Day Outreach Program for the last three weeks. He went back into the hospital on 11/14/23 for continued Pneumonia. He was discharged 11/17/23. He will have a Resumption of Care regarding Home Health services. He has a couple of days to finish his antibiotics. He states he does not wish to continue with the [facility] program and will be discharged today.  Deidre Ala, BSN, RN [place]  VBCI - [employer] [facility] RN Care Manager [phone]

## 2023-11-19 DIAGNOSIS — J439 Emphysema, unspecified: Secondary | ICD-10-CM | POA: Diagnosis not present

## 2023-11-19 DIAGNOSIS — J9621 Acute and chronic respiratory failure with hypoxia: Secondary | ICD-10-CM | POA: Diagnosis not present

## 2023-11-19 DIAGNOSIS — I9589 Other hypotension: Secondary | ICD-10-CM | POA: Diagnosis not present

## 2023-11-19 DIAGNOSIS — J440 Chronic obstructive pulmonary disease with acute lower respiratory infection: Secondary | ICD-10-CM | POA: Diagnosis not present

## 2023-11-19 DIAGNOSIS — N179 Acute kidney failure, unspecified: Secondary | ICD-10-CM | POA: Diagnosis not present

## 2023-11-19 DIAGNOSIS — J189 Pneumonia, unspecified organism: Secondary | ICD-10-CM | POA: Diagnosis not present

## 2023-11-21 DIAGNOSIS — N179 Acute kidney failure, unspecified: Secondary | ICD-10-CM | POA: Diagnosis not present

## 2023-11-21 DIAGNOSIS — J439 Emphysema, unspecified: Secondary | ICD-10-CM | POA: Diagnosis not present

## 2023-11-21 DIAGNOSIS — I9589 Other hypotension: Secondary | ICD-10-CM | POA: Diagnosis not present

## 2023-11-21 DIAGNOSIS — J440 Chronic obstructive pulmonary disease with acute lower respiratory infection: Secondary | ICD-10-CM | POA: Diagnosis not present

## 2023-11-21 DIAGNOSIS — J9621 Acute and chronic respiratory failure with hypoxia: Secondary | ICD-10-CM | POA: Diagnosis not present

## 2023-11-21 DIAGNOSIS — J189 Pneumonia, unspecified organism: Secondary | ICD-10-CM | POA: Diagnosis not present

## 2023-11-26 DIAGNOSIS — N179 Acute kidney failure, unspecified: Secondary | ICD-10-CM | POA: Diagnosis not present

## 2023-11-26 DIAGNOSIS — I9589 Other hypotension: Secondary | ICD-10-CM | POA: Diagnosis not present

## 2023-11-26 DIAGNOSIS — J189 Pneumonia, unspecified organism: Secondary | ICD-10-CM | POA: Diagnosis not present

## 2023-11-26 DIAGNOSIS — J9621 Acute and chronic respiratory failure with hypoxia: Secondary | ICD-10-CM | POA: Diagnosis not present

## 2023-11-26 DIAGNOSIS — J440 Chronic obstructive pulmonary disease with acute lower respiratory infection: Secondary | ICD-10-CM | POA: Diagnosis not present

## 2023-11-26 DIAGNOSIS — J439 Emphysema, unspecified: Secondary | ICD-10-CM | POA: Diagnosis not present

## 2023-11-27 DIAGNOSIS — J439 Emphysema, unspecified: Secondary | ICD-10-CM | POA: Diagnosis not present

## 2023-11-27 DIAGNOSIS — N179 Acute kidney failure, unspecified: Secondary | ICD-10-CM | POA: Diagnosis not present

## 2023-11-27 DIAGNOSIS — J440 Chronic obstructive pulmonary disease with acute lower respiratory infection: Secondary | ICD-10-CM | POA: Diagnosis not present

## 2023-11-27 DIAGNOSIS — J9621 Acute and chronic respiratory failure with hypoxia: Secondary | ICD-10-CM | POA: Diagnosis not present

## 2023-11-27 DIAGNOSIS — I9589 Other hypotension: Secondary | ICD-10-CM | POA: Diagnosis not present

## 2023-11-27 DIAGNOSIS — J189 Pneumonia, unspecified organism: Secondary | ICD-10-CM | POA: Diagnosis not present

## 2023-11-29 DIAGNOSIS — E7800 Pure hypercholesterolemia, unspecified: Secondary | ICD-10-CM | POA: Diagnosis not present

## 2023-11-29 DIAGNOSIS — E876 Hypokalemia: Secondary | ICD-10-CM | POA: Diagnosis not present

## 2023-11-29 DIAGNOSIS — J181 Lobar pneumonia, unspecified organism: Secondary | ICD-10-CM | POA: Diagnosis not present

## 2023-11-29 DIAGNOSIS — I130 Hypertensive heart and chronic kidney disease with heart failure and stage 1 through stage 4 chronic kidney disease, or unspecified chronic kidney disease: Secondary | ICD-10-CM | POA: Diagnosis not present

## 2023-11-29 DIAGNOSIS — G4733 Obstructive sleep apnea (adult) (pediatric): Secondary | ICD-10-CM | POA: Diagnosis not present

## 2023-11-29 DIAGNOSIS — J440 Chronic obstructive pulmonary disease with acute lower respiratory infection: Secondary | ICD-10-CM | POA: Diagnosis not present

## 2023-11-29 DIAGNOSIS — M109 Gout, unspecified: Secondary | ICD-10-CM | POA: Diagnosis not present

## 2023-11-29 DIAGNOSIS — D696 Thrombocytopenia, unspecified: Secondary | ICD-10-CM | POA: Diagnosis not present

## 2023-11-29 DIAGNOSIS — I2724 Chronic thromboembolic pulmonary hypertension: Secondary | ICD-10-CM | POA: Diagnosis not present

## 2023-11-29 DIAGNOSIS — I252 Old myocardial infarction: Secondary | ICD-10-CM | POA: Diagnosis not present

## 2023-11-29 DIAGNOSIS — I700 Atherosclerosis of aorta: Secondary | ICD-10-CM | POA: Diagnosis not present

## 2023-11-29 DIAGNOSIS — A419 Sepsis, unspecified organism: Secondary | ICD-10-CM | POA: Diagnosis not present

## 2023-11-29 DIAGNOSIS — J439 Emphysema, unspecified: Secondary | ICD-10-CM | POA: Diagnosis not present

## 2023-11-29 DIAGNOSIS — I9589 Other hypotension: Secondary | ICD-10-CM | POA: Diagnosis not present

## 2023-11-29 DIAGNOSIS — E1122 Type 2 diabetes mellitus with diabetic chronic kidney disease: Secondary | ICD-10-CM | POA: Diagnosis not present

## 2023-11-29 DIAGNOSIS — E1142 Type 2 diabetes mellitus with diabetic polyneuropathy: Secondary | ICD-10-CM | POA: Diagnosis not present

## 2023-11-29 DIAGNOSIS — N179 Acute kidney failure, unspecified: Secondary | ICD-10-CM | POA: Diagnosis not present

## 2023-11-29 DIAGNOSIS — R1313 Dysphagia, pharyngeal phase: Secondary | ICD-10-CM | POA: Diagnosis not present

## 2023-11-29 DIAGNOSIS — I2782 Chronic pulmonary embolism: Secondary | ICD-10-CM | POA: Diagnosis not present

## 2023-11-29 DIAGNOSIS — N1832 Chronic kidney disease, stage 3b: Secondary | ICD-10-CM | POA: Diagnosis not present

## 2023-11-29 DIAGNOSIS — I5032 Chronic diastolic (congestive) heart failure: Secondary | ICD-10-CM | POA: Diagnosis not present

## 2023-11-29 DIAGNOSIS — I4510 Unspecified right bundle-branch block: Secondary | ICD-10-CM | POA: Diagnosis not present

## 2023-11-29 DIAGNOSIS — J9621 Acute and chronic respiratory failure with hypoxia: Secondary | ICD-10-CM | POA: Diagnosis not present

## 2023-12-02 DIAGNOSIS — A419 Sepsis, unspecified organism: Secondary | ICD-10-CM | POA: Diagnosis not present

## 2023-12-02 DIAGNOSIS — J439 Emphysema, unspecified: Secondary | ICD-10-CM | POA: Diagnosis not present

## 2023-12-02 DIAGNOSIS — J9621 Acute and chronic respiratory failure with hypoxia: Secondary | ICD-10-CM | POA: Diagnosis not present

## 2023-12-02 DIAGNOSIS — J181 Lobar pneumonia, unspecified organism: Secondary | ICD-10-CM | POA: Diagnosis not present

## 2023-12-02 DIAGNOSIS — J440 Chronic obstructive pulmonary disease with acute lower respiratory infection: Secondary | ICD-10-CM | POA: Diagnosis not present

## 2023-12-02 DIAGNOSIS — N179 Acute kidney failure, unspecified: Secondary | ICD-10-CM | POA: Diagnosis not present

## 2023-12-03 ENCOUNTER — Encounter: Payer: Self-pay | Admitting: Podiatry

## 2023-12-03 ENCOUNTER — Ambulatory Visit (INDEPENDENT_AMBULATORY_CARE_PROVIDER_SITE_OTHER): Payer: Medicare Other | Admitting: Podiatry

## 2023-12-03 DIAGNOSIS — B351 Tinea unguium: Secondary | ICD-10-CM

## 2023-12-03 DIAGNOSIS — L84 Corns and callosities: Secondary | ICD-10-CM | POA: Diagnosis not present

## 2023-12-03 DIAGNOSIS — M79609 Pain in unspecified limb: Secondary | ICD-10-CM

## 2023-12-03 DIAGNOSIS — D689 Coagulation defect, unspecified: Secondary | ICD-10-CM

## 2023-12-03 DIAGNOSIS — E1169 Type 2 diabetes mellitus with other specified complication: Secondary | ICD-10-CM | POA: Diagnosis not present

## 2023-12-04 DIAGNOSIS — N179 Acute kidney failure, unspecified: Secondary | ICD-10-CM | POA: Diagnosis not present

## 2023-12-04 DIAGNOSIS — J440 Chronic obstructive pulmonary disease with acute lower respiratory infection: Secondary | ICD-10-CM | POA: Diagnosis not present

## 2023-12-04 DIAGNOSIS — J439 Emphysema, unspecified: Secondary | ICD-10-CM | POA: Diagnosis not present

## 2023-12-04 DIAGNOSIS — J9621 Acute and chronic respiratory failure with hypoxia: Secondary | ICD-10-CM | POA: Diagnosis not present

## 2023-12-04 DIAGNOSIS — A419 Sepsis, unspecified organism: Secondary | ICD-10-CM | POA: Diagnosis not present

## 2023-12-04 DIAGNOSIS — J181 Lobar pneumonia, unspecified organism: Secondary | ICD-10-CM | POA: Diagnosis not present

## 2023-12-09 DIAGNOSIS — J9621 Acute and chronic respiratory failure with hypoxia: Secondary | ICD-10-CM | POA: Diagnosis not present

## 2023-12-09 DIAGNOSIS — J440 Chronic obstructive pulmonary disease with acute lower respiratory infection: Secondary | ICD-10-CM | POA: Diagnosis not present

## 2023-12-09 DIAGNOSIS — J439 Emphysema, unspecified: Secondary | ICD-10-CM | POA: Diagnosis not present

## 2023-12-09 DIAGNOSIS — J181 Lobar pneumonia, unspecified organism: Secondary | ICD-10-CM | POA: Diagnosis not present

## 2023-12-09 DIAGNOSIS — N179 Acute kidney failure, unspecified: Secondary | ICD-10-CM | POA: Diagnosis not present

## 2023-12-09 DIAGNOSIS — A419 Sepsis, unspecified organism: Secondary | ICD-10-CM | POA: Diagnosis not present

## 2023-12-09 NOTE — Progress Notes (Signed)
  Subjective:  Patient ID: Loralie Rocher, male    DOB: 10-30-1935,  MRN: [id number]  JUDD MCCUBBIN presents to clinic today for at risk foot care. Patient has history of diabetes and coagulation defect.   Chief Complaint  Patient presents with   Nail Problem    "Trim my nails." (Pt. Stated he's not Diabetic.)   New problem(s): None.   PCP is Pete Brand, DO. Holli Lunger 11/04/2023.  Allergies  Allergen Reactions   Other Swelling    Beer - swelling    Sunflower Oil Swelling   Sulfa Antibiotics Rash    Review of Systems: Negative except as noted in the HPI.  Objective: No changes noted in today's physical examination. There were no vitals filed for this visit. DYAN CREELMAN is a pleasant 88 y.o. male frail, in NAD. AAO x 3.On O2 concentrator.  Vascular Examination: Capillary refill time <3 seconds b/l LE. Palpable pedal pulses b/l LE. Digital hair present b/l. No pedal edema b/l. Skin temperature gradient WNL b/l. No varicosities b/l. No ischemia or gangrene noted b/l LE. No cyanosis or clubbing noted b/l LE.Aaron Aas  Dermatological Examination: Pedal skin with normal turgor, texture and tone b/l. No open wounds. No interdigital macerations b/l. Toenails 1-5 b/l thickened, discolored, dystrophic with subungual debris. There is pain on palpation to dorsal aspect of nailplates. Preulcerative lesion(s) submet head 5 right .  No erythema, no edema, no drainage, no fluctuance..  Neurological Examination: Protective sensation intact with 10 gram monofilament b/l LE. Vibratory sensation intact b/l LE.   Musculoskeletal Examination: Muscle strength 5/5 to all lower extremity muscle groups bilaterally. Hammertoe(s) noted to the bilateral 5th toes. Patient ambulates independent of any assistive aids.  Assessment/Plan: 1. Pain due to onychomycosis of nail   2. Pre-ulcerative calluses   3. Coagulation disorder (HCC)   4. Type 2 diabetes mellitus with other specified complication, unspecified  whether long term insulin  use (HCC)     -Patient was evaluated today. All questions/concerns addressed on today's visit. -Examined patient. -Mycotic toenails 1-5 bilaterally were debrided in length and girth with sterile nail nippers and dremel without incident. -Preulcerative lesion pared submet head 5 right foot utilizing sterile scalpel blade. Total number pared=1. -Patient/POA to call should there be question/concern in the interim.   Return in about 3 months (around 03/03/2024).  Luella Sager, DPM      [place] LOCATION: [address], [place] [address]                   Office [phone]   [facility] LOCATION: [address] [place], [place] [address] Office [phone]

## 2023-12-10 ENCOUNTER — Other Ambulatory Visit (HOSPITAL_COMMUNITY): Payer: Self-pay | Admitting: *Deleted

## 2023-12-10 DIAGNOSIS — J181 Lobar pneumonia, unspecified organism: Secondary | ICD-10-CM | POA: Diagnosis not present

## 2023-12-10 DIAGNOSIS — J440 Chronic obstructive pulmonary disease with acute lower respiratory infection: Secondary | ICD-10-CM | POA: Diagnosis not present

## 2023-12-10 DIAGNOSIS — A419 Sepsis, unspecified organism: Secondary | ICD-10-CM | POA: Diagnosis not present

## 2023-12-10 DIAGNOSIS — J9621 Acute and chronic respiratory failure with hypoxia: Secondary | ICD-10-CM | POA: Diagnosis not present

## 2023-12-10 DIAGNOSIS — J439 Emphysema, unspecified: Secondary | ICD-10-CM | POA: Diagnosis not present

## 2023-12-10 DIAGNOSIS — N179 Acute kidney failure, unspecified: Secondary | ICD-10-CM | POA: Diagnosis not present

## 2023-12-10 MED ORDER — POTASSIUM CHLORIDE ER 10 MEQ PO TBCR: 10.0000 meq | EXTENDED_RELEASE_TABLET | Freq: Two times a day (BID) | ORAL | 11 refills | Status: DC

## 2023-12-16 DIAGNOSIS — N179 Acute kidney failure, unspecified: Secondary | ICD-10-CM | POA: Diagnosis not present

## 2023-12-16 DIAGNOSIS — J439 Emphysema, unspecified: Secondary | ICD-10-CM | POA: Diagnosis not present

## 2023-12-16 DIAGNOSIS — J181 Lobar pneumonia, unspecified organism: Secondary | ICD-10-CM | POA: Diagnosis not present

## 2023-12-16 DIAGNOSIS — J9621 Acute and chronic respiratory failure with hypoxia: Secondary | ICD-10-CM | POA: Diagnosis not present

## 2023-12-16 DIAGNOSIS — J440 Chronic obstructive pulmonary disease with acute lower respiratory infection: Secondary | ICD-10-CM | POA: Diagnosis not present

## 2023-12-16 DIAGNOSIS — A419 Sepsis, unspecified organism: Secondary | ICD-10-CM | POA: Diagnosis not present

## 2023-12-22 DIAGNOSIS — J181 Lobar pneumonia, unspecified organism: Secondary | ICD-10-CM | POA: Diagnosis not present

## 2023-12-22 DIAGNOSIS — J440 Chronic obstructive pulmonary disease with acute lower respiratory infection: Secondary | ICD-10-CM | POA: Diagnosis not present

## 2023-12-22 DIAGNOSIS — J9621 Acute and chronic respiratory failure with hypoxia: Secondary | ICD-10-CM | POA: Diagnosis not present

## 2023-12-22 DIAGNOSIS — J439 Emphysema, unspecified: Secondary | ICD-10-CM | POA: Diagnosis not present

## 2023-12-22 DIAGNOSIS — A419 Sepsis, unspecified organism: Secondary | ICD-10-CM | POA: Diagnosis not present

## 2023-12-22 DIAGNOSIS — N179 Acute kidney failure, unspecified: Secondary | ICD-10-CM | POA: Diagnosis not present

## 2023-12-24 DIAGNOSIS — R066 Hiccough: Secondary | ICD-10-CM | POA: Diagnosis not present

## 2023-12-24 DIAGNOSIS — R634 Abnormal weight loss: Secondary | ICD-10-CM | POA: Diagnosis not present

## 2023-12-24 DIAGNOSIS — K219 Gastro-esophageal reflux disease without esophagitis: Secondary | ICD-10-CM | POA: Diagnosis not present

## 2023-12-25 DIAGNOSIS — N179 Acute kidney failure, unspecified: Secondary | ICD-10-CM | POA: Diagnosis not present

## 2023-12-25 DIAGNOSIS — J181 Lobar pneumonia, unspecified organism: Secondary | ICD-10-CM | POA: Diagnosis not present

## 2023-12-25 DIAGNOSIS — A419 Sepsis, unspecified organism: Secondary | ICD-10-CM | POA: Diagnosis not present

## 2023-12-25 DIAGNOSIS — J439 Emphysema, unspecified: Secondary | ICD-10-CM | POA: Diagnosis not present

## 2023-12-25 DIAGNOSIS — J9621 Acute and chronic respiratory failure with hypoxia: Secondary | ICD-10-CM | POA: Diagnosis not present

## 2023-12-25 DIAGNOSIS — J440 Chronic obstructive pulmonary disease with acute lower respiratory infection: Secondary | ICD-10-CM | POA: Diagnosis not present

## 2024-01-02 ENCOUNTER — Telehealth: Payer: Self-pay | Admitting: Student

## 2024-01-02 NOTE — Telephone Encounter (Signed)
 Rc'd fax fro [employer] for O2. Will fwd to Ms. Cobb for signature.

## 2024-01-31 ENCOUNTER — Other Ambulatory Visit (HOSPITAL_COMMUNITY): Payer: Self-pay | Admitting: Family Medicine

## 2024-02-27 ENCOUNTER — Telehealth (HOSPITAL_COMMUNITY): Payer: Self-pay | Admitting: *Deleted

## 2024-02-27 NOTE — Telephone Encounter (Signed)
 Called to confirm/remind patient of their appointment [facility] on 03/[facility].      Appointment:              [x] Confirmed             [] Left mess              [] No answer/No voice mail             [] Phone not in service   Patient reminded to bring all medications and/or complete list.   Confirmed patient has transportation. Gave directions, instructed to utilize valet parking.

## 2024-02-27 NOTE — Progress Notes (Signed)
 PCP: Gerome Brunet, DO Cardiology: Dr. Wonda HF Cardiology: Dr. Rolan  88 y.o. with history of HFPEF, prior PE, and pulmonary hypertension was referred by Dr. Santo for evaluation of pulmonary hypertension.  Patient had a PE in 2016.  He is on apixaban . He has a long history of diastolic CHF.  Most recent echo showed a significant component of RV failure with EF 55-60%, IV septum flattened, severe RV enlargement, severely decreased RV function, PASP 57 mmHg.  CTA chest in 4/22 showed no acute PE and mild emphysema, but V/Q scan in 5/22 was suggestive of chronic PE in the right middle lobe.  RHC in 4/22 showed normal filling pressures with moderate PAH. He additionally has chronic hiccups followed by Dr. Rollin, now on gabapentin . PFTs in 6/22 showed mixed picture with severe obstruction, moderate restriction, and severely decreased DLCO.   Admitted 8/25-8/30/22 with A/C HF exacerbation. He was aggressively diuresed with lasix /metolazone , had unna boots, farxiga  added.  Midodrine  was increased for hypotension. Hospitalization c/b AKI on CKD III.  Readmitted 9/1-9/12/22 with shock (mixed septic and cardiogenic), likely 2/2 to aspiration pneumonia and A/C CHF.  Suspect possible gut translocation with ileus/partial SBO; CT abdomen with nutcracker phenomenon.  He was started on NE + antibiotics. General surgery consulted and felt not to be a surgical candidate. Diuretics initially held due to over-diuresis and shock. Eventually able to wean pressors off, restart PO torsemide  and midodrine . Hospitalization c/b transaminitis, delirium, and BPH with urinary retention, requiring foley catheter. Palliative care was consulted for GOC and patient DNR/DNI. PT/OT recommended HH. He was discharged home, weight 220 lbs.  EGD 11/22 showed esophagitis, hiatal hernia and yeast.   Zio 11/22 showed 19 second run of VT.   Admitted 2/23 with PNA and a/c CHF. Seen by palliative care and remained full  code.  Admitted 8/23 with septic shock due to aspiration PNA & possible colitis. Echo showed EF 60-65%, RV severely reduced, severely enlarged, RVSP 50, LVEF 60-65%, no RWMA, mild TR, trivial MR. AHF consulted for elevated Hs trop. He underwent R/LHC showing nonobstructive mild CAD, normal/low filling pressures and mild PAH. Troponin elevation likely due to demand ischemia. Hospitalization c/b AKI on CKD. He was restarted on Farxiga  and lower dose torsemide  (10 mg daily). He was discharged home, weight 175 lbs.   He was admitted 11/23 with a/c respiratory failure, recently discharged (10/23)  after COVID-19 infection. Felt to be septic 2/2 PNA vs UTI. Received IV abx. HsTrop elevated on admission but felt to be demand ischemia. Stable from a HF standpoint and continued on torsemide , midodrine  and Farxiga . He was discharged home with HH, weight 188 lbs.  Echo 5/24 showed EF 60-65%, grade I DD, RV function moderately reduced and RV severely enlarged  Admitted in 7/24 with RLL PNA (aspiration likely) and COPD exacerbation.   Admitted 2/25 with CAP. Treated with abx, transiently required NE for BP support. Echo (2/25): EF 60-65%, D shaped septum suggesting cor pulmonale, RV severely reduced. Re-admitted 3/25 with re-current PNA and a/c dHF. Diuresed, continued on midodrine . He was discharged home, weight 180 lbs.  Today he returns for post hospital HF follow up with his daughter. Overall feeling fine. He says breathing is slow but does OK walking on flat ground if he takes him time. Denies palpitations, abnormal bleeding, CP, dizziness, edema, or PND/Orthopnea. Chronically sleeps reclined on a wedge.  Appetite ok. No fever or chills. Weight at home 183 pounds. Taking all medications. Wears 2 L oxygen  at home. PT starts  today. Lives at home with his wife, daughter lives nearby and helps out.  ECG (personally reviewed): NSR rBBB  Labs (6/22): K 3.8, creatinine 1.72, ANA negative, RF 39.6 but CCP  negative, SCL-70 negative, BNP 1231, HIV negative Labs (2/24): K 3.6, creatinine 1.64, hgb 11.4, BNP 90 Labs (5/24): K 3.7, creatinine 1.66 Labs (7/24): K 4.2, creatinine 1.07 => 1.08 Labs (11/24): LDL 21 Labs (3/25): K 4.2, creatinine 1.25   6 minute walk (7/22): 213 m 6 minute walk (10/22): 274 m (oxygen  saturation dropped to 70s on 2L [place])  PMH: 1. HFpEF: With prominent RV failure.   - Echo (2/22): EF 55-60%, IV septum flattened, severe RV enlargement, severely decreased RV function, PASP 57 mmHg.  - Echo (8/23): EF 60-65%, RV severely reduced, severely enlarged, RVSP 50, LVEF 60-65%, no RWMA, mild TR, trivial MR.  - Echo (5/24): EF 60-65%, grade I DD, RV function moderately reduced and RV severely enlarged - Echo (2/25): EF 60-65%, D shaped septum suggesting cor pulmonale, RV severely reduced 2. Venous thromboembolic disease: PE in 2016.   - Venous dopplers (4/22): No DVT.  - CTA chest (4/22): No PE.  - V/Q scan 5/22 with perfusion defect in the RML consistent with chronic PE.  3. OSA: Did not tolerate CPAP.   4. HTN 5. COPD: Prior smoker.   - CTA chest in 4/22 showed no PE, mild emphysema.  - PFTs (6/22) with severe obstruction, moderate restriction, severely decreased DLCO 6. Pulmonary hypertension: RHC (4/22) with mean RA 5, PA 65/19 mean 36, mean PCWP 5, CI 2.19, PVR 6.1 WU, PAPi 9.2.  - RHC (8/23) with mean RA 1, PA 46/10 mean 25, mean PCWP 4, CI 2.98, PVR 3.4 WU 7. Chronic hiccups 8. BPH: Has to in and out cath at times. 9. Syncope: Zio 12/22 with 19 second VT run.  10. Chronic hiccups 11. 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 12. CAD: LHC (8/23) with nonobstructive CAD.   Social History   Socioeconomic History   Marital status: Married    Spouse name: Joann   Number of children: 2   Years of education: 14   Highest education level: Not on file  Occupational History   Occupation: postal  service,[employer] there,school crossing guard. Stopped working in  2022  Tobacco Use   Smoking status: Former    Current packs/day: 0.00    Average packs/day: 2.0 packs/day for 52.0 years (104.0 ttl pk-yrs)    Types: Cigarettes    Start date: 66    Quit date: 08/19/2006    Years since quitting: 17.5   Smokeless tobacco: Never   Tobacco comments:    Former smoke 03/15/22  Vaping Use   Vaping status: Never Used  Substance and Sexual Activity   Alcohol use: Not Currently    Alcohol/week: 6.0 standard drinks of alcohol    Types: 6 Shots of liquor per week    Comment: stopped in 2018   Drug use: No   Sexual activity: Not Currently  Other Topics Concern   Not on file  Social History Narrative   Not on file   Social Drivers of Health   Financial Resource Strain: Not on file  Food Insecurity: No Food Insecurity (11/18/2023)   Hunger Vital Sign    Worried About Running Out of Food in the Last Year: Never true    Ran Out of Food in the Last Year: Never  true  Transportation Needs: No Transportation Needs (11/18/2023)   PRAPARE - Administrator, Civil Service (Medical): No    Lack of Transportation (Non-Medical): No  Recent Concern: Transportation Needs - Unmet Transportation Needs (10/10/2023)   PRAPARE - Transportation    Lack of Transportation (Medical): No    Lack of Transportation (Non-Medical): Yes  Physical Activity: Not on file  Stress: No Stress Concern Present (05/09/2021)   [employer] of Occupational Health - Occupational Stress Questionnaire    Feeling of Stress : Only a little  Social Connections: Socially Integrated (11/13/2023)   Social Connection and Isolation Panel    Frequency of Communication with Friends and Family: Three times a week    Frequency of Social Gatherings with Friends and Family: Three times a week    Attends Religious Services: 1 to 4 times per year    Active Member of Clubs or Organizations: No    Attends Tax inspector Meetings: 1 to 4 times per year    Marital Status: Married  Catering manager Violence: Not At Risk (11/18/2023)   Humiliation, Afraid, Rape, and Kick questionnaire    Fear of Current or Ex-Partner: No    Emotionally Abused: No    Physically Abused: No    Sexually Abused: No   Family History  Problem Relation Age of Onset   Heart attack Brother 57   ROS: All systems reviewed and negative except as per HPI.   Current Outpatient Medications  Medication Sig Dispense Refill   ADEMPAS  2 MG TABS TAKE 1 TABLET BY MOUTH 3 TIMES A DAY AS DIRECTED. DO NOT HANDLE IF PREGNANT 90 tablet 11   albuterol  (VENTOLIN  HFA) 108 (90 Base) MCG/ACT inhaler Inhale 1 puff into the lungs every 4 (four) hours as needed for shortness of breath.     allopurinol  (ZYLOPRIM ) 100 MG tablet Take 100 mg by mouth every morning.     atorvastatin  (LIPITOR ) 40 MG tablet TAKE 1 TABLET BY MOUTH EVERY DAY 90 tablet 2   azelastine  (ASTELIN ) 0.1 % nasal spray Place 2 sprays into both nostrils 2 (two) times daily as needed for rhinitis. Use in each nostril as directed     CINNAMON  PO Take 1 capsule by mouth daily.     ELIQUIS  5 MG TABS tablet TAKE 1 TABLET BY MOUTH TWICE A DAY 60 tablet 3   FARXIGA  10 MG TABS tablet TAKE 1 TABLET BY MOUTH DAILY BEFORE BREAKFAST. 90 tablet 3   finasteride  (PROSCAR ) 5 MG tablet Take 5 mg by mouth daily.     gabapentin  (NEURONTIN ) 300 MG capsule Take 300 mg by mouth 3 (three) times daily.     magnesium  oxide (MAG-OX) 400 (240 Mg) MG tablet TAKE 1 TABLET BY MOUTH EVERY DAY 90 tablet 1   metoCLOPramide  (REGLAN ) 5 MG tablet Take 5 mg by mouth 3 (three) times daily with meals as needed for nausea or vomiting.     midodrine  (PROAMATINE ) 5 MG tablet Take 3 tablets (15 mg total) by mouth 3 (three) times daily with meals. 270 tablet 8   Multiple Vitamin (MULTIVITAMIN WITH MINERALS) TABS tablet Take 1 tablet by mouth daily. 30 tablet 0   OXYGEN  Inhale 3 L/min into the lungs continuous.      pantoprazole  (PROTONIX ) 40 MG tablet Take 1 tablet (40 mg total) by mouth every morning. 30 tablet 5   potassium chloride  (KLOR-CON ) 10 MEQ tablet Take 1 tablet (10 mEq total) by mouth 2 (two) times daily. 60 tablet  11   Tiotropium Bromide -Olodaterol (STIOLTO RESPIMAT ) 2.5-2.5 MCG/ACT AERS Inhale 2 puffs into the lungs daily. 12 g 1   torsemide  (DEMADEX ) 10 MG tablet Take 10 mg by mouth daily.     No current facility-administered medications for this visit.   Wt Readings from Last 3 Encounters:  11/13/23 78.9 kg (174 lb)  11/02/23 80.7 kg (178 lb)  10/31/23 79.5 kg (175 lb 3.2 oz)   There were no vitals taken for this visit. Physical Exam General:  NAD. No resp difficulty, walked into clinic, thin on oxygen  HEENT: Normal Neck: Supple. No JVD. Cor: Regular rate & rhythm. No rubs, gallops or murmurs. Lungs: Clear, diminished in bases Abdomen: Soft, nontender, nondistended.  Extremities: No cyanosis, clubbing, rash, edema Neuro: Alert & oriented x 3, moves all 4 extremities w/o difficulty. Affect pleasant.  Assessment/Plan: 1. Chronic HFpEF/RV failure: Echo (2/22) with EF 55-60%, IV septum flattened, severe RV enlargement, severely decreased RV function, PASP 57 mmHg.  He has severe RV failure at baseline.  Echo this admit 8/23 >>RV severely reduced, severely enlarged, RVSP 50, LVEF 60-65%, no RWMA, mild TR, trivial MR.  RHC 8/23 showed normal/low filling pressures and mild PAH.  Echo 5/24 showed EF 60-65%, RV moderately reduced. Echo (2/25): EF of 55% and severely enlarged RV w/ severely reduced function. NYHA class II-early III, as long as he is wearing his oxygen . He is not volume overloaded on exam. Midodrine  seems to be controlling his orthostasis.  - Continue Farxiga  10 mg daily. BMET/BNP today. - Continue torsemide  20 mg daily + 20 KCL daily. - Continue midodrine  15 mg tid. Would avoid fludrocortisone. - Continue compression hose. 2. Pulmonary HTN: PAH noted on 4/22 RHC with PVR 6.1  WU.  This appears to be multifactorial with OSA, severe emphysema, and a suspected chronic PE involving the right middle lobe (group 3 and group 4 PH). Given the suspected mixed etiology with only 1 area of chronic thromboembolism (right middle lobe) as well as age, do not think that pulmonary thromboendarterectomy would be indicated.  Rheumatologic serologic workup was negative.  PFTs showed severe obstruction and moderate restriction, suggesting significant COPD. RHC 8/23 showed mild pulmonary hypertension with PVR 3.4 WU.  - Continue riociguat .  - Holding off Tyvaso for now, suspect large group 3 PH component and only mildly elevated PA pressure on 8/23 RHC.  3. Chronic respiratory failure: Admission 10/23 for COVID. 7/24 and 2/25 admission for PNA.  - Stable on 2L [place]. 4. CKD III: Continue SGLT2i. BMET today. 5. CAD: nonobstructive on [facility] 8/23. No exertional chest pain.  - No ASA with need for apixaban .  - Continue statin. Good lipids 11/24. 6. OSA: Moderate OSA on sleep study. He did not tolerate CPAP.  - Continue oxygen  7. Emphysema: Prior smoker.  Emphysema on CT and severe obstruction on PFTs. On 2L home oxygen . 8. Chronic PE: Diagnosed by V/Q scan.  No abnormal bleeding. He has a probable chronic RLE DVT.   - Continue Eliquis . No bleeding issues. CBC & iron panel today.  9. Hiatial hernia w/ esophagitis: Has significant GERD. He follows with Dr. Rollin. - Continue PPI.  10. Nutcracker SMA: Compresses duodenum - No change  Follow up in 4 months with APP.    Harlene HERO [facility] FNP-BC 02/27/24

## 2024-03-01 ENCOUNTER — Ambulatory Visit (HOSPITAL_COMMUNITY)
Admission: RE | Admit: 2024-03-01 | Discharge: 2024-03-01 | Disposition: A | Discharge status: Home / Self Care | Source: Ambulatory Visit | Attending: Family Medicine | Admitting: Family Medicine

## 2024-03-01 ENCOUNTER — Ambulatory Visit (HOSPITAL_COMMUNITY): Payer: Self-pay | Admitting: Family Medicine

## 2024-03-01 ENCOUNTER — Encounter (HOSPITAL_COMMUNITY): Payer: Self-pay

## 2024-03-01 VITALS — BP 124/64 | HR 73 | Ht 75.0 in | Wt 187.8 lb

## 2024-03-01 DIAGNOSIS — Z86711 Personal history of pulmonary embolism: Secondary | ICD-10-CM | POA: Diagnosis not present

## 2024-03-01 DIAGNOSIS — K2100 Gastro-esophageal reflux disease with esophagitis, without bleeding: Secondary | ICD-10-CM | POA: Diagnosis not present

## 2024-03-01 DIAGNOSIS — I2510 Atherosclerotic heart disease of native coronary artery without angina pectoris: Secondary | ICD-10-CM | POA: Diagnosis not present

## 2024-03-01 DIAGNOSIS — Z7901 Long term (current) use of anticoagulants: Secondary | ICD-10-CM | POA: Insufficient documentation

## 2024-03-01 DIAGNOSIS — J439 Emphysema, unspecified: Secondary | ICD-10-CM | POA: Diagnosis not present

## 2024-03-01 DIAGNOSIS — K449 Diaphragmatic hernia without obstruction or gangrene: Secondary | ICD-10-CM

## 2024-03-01 DIAGNOSIS — I2782 Chronic pulmonary embolism: Secondary | ICD-10-CM | POA: Insufficient documentation

## 2024-03-01 DIAGNOSIS — N183 Chronic kidney disease, stage 3 unspecified: Secondary | ICD-10-CM | POA: Insufficient documentation

## 2024-03-01 DIAGNOSIS — J9611 Chronic respiratory failure with hypoxia: Secondary | ICD-10-CM

## 2024-03-01 DIAGNOSIS — Z9981 Dependence on supplemental oxygen: Secondary | ICD-10-CM | POA: Diagnosis not present

## 2024-03-01 DIAGNOSIS — Z7984 Long term (current) use of oral hypoglycemic drugs: Secondary | ICD-10-CM | POA: Insufficient documentation

## 2024-03-01 DIAGNOSIS — K56600 Partial intestinal obstruction, unspecified as to cause: Secondary | ICD-10-CM

## 2024-03-01 DIAGNOSIS — I272 Pulmonary hypertension, unspecified: Secondary | ICD-10-CM | POA: Diagnosis not present

## 2024-03-01 DIAGNOSIS — I5032 Chronic diastolic (congestive) heart failure: Secondary | ICD-10-CM | POA: Diagnosis not present

## 2024-03-01 DIAGNOSIS — J9610 Chronic respiratory failure, unspecified whether with hypoxia or hypercapnia: Secondary | ICD-10-CM | POA: Insufficient documentation

## 2024-03-01 DIAGNOSIS — I130 Hypertensive heart and chronic kidney disease with heart failure and stage 1 through stage 4 chronic kidney disease, or unspecified chronic kidney disease: Secondary | ICD-10-CM | POA: Insufficient documentation

## 2024-03-01 DIAGNOSIS — K469 Unspecified abdominal hernia without obstruction or gangrene: Secondary | ICD-10-CM | POA: Insufficient documentation

## 2024-03-01 DIAGNOSIS — G4733 Obstructive sleep apnea (adult) (pediatric): Secondary | ICD-10-CM | POA: Diagnosis not present

## 2024-03-01 DIAGNOSIS — J432 Centrilobular emphysema: Secondary | ICD-10-CM

## 2024-03-01 DIAGNOSIS — Z79899 Other long term (current) drug therapy: Secondary | ICD-10-CM | POA: Diagnosis not present

## 2024-03-01 LAB — BASIC METABOLIC PANEL WITH GFR
Anion gap: 8 (ref 5–15)
BUN: 24 mg/dL — ABNORMAL HIGH (ref 8–23)
CO2: 30 mmol/L (ref 22–32)
Calcium: 9.9 mg/dL (ref 8.9–10.3)
Chloride: 98 mmol/L (ref 98–111)
Creatinine, Ser: 1.71 mg/dL — ABNORMAL HIGH (ref 0.61–1.24)
GFR, Estimated: 39 mL/min — ABNORMAL LOW (ref >60)
Glucose, Bld: 83 mg/dL (ref 70–99)
Potassium: 4.6 mmol/L (ref 3.5–5.1)
Sodium: 136 mmol/L (ref 135–145)

## 2024-03-01 LAB — CBC
HCT: 42.3 % (ref 39.0–52.0)
Hemoglobin: 13.4 g/dL (ref 13.0–17.0)
MCH: 27.8 pg (ref 26.0–34.0)
MCHC: 31.7 g/dL (ref 30.0–36.0)
MCV: 87.8 fL (ref 80.0–100.0)
Platelets: 213 K/uL (ref 150–400)
RBC: 4.82 MIL/uL (ref 4.22–5.81)
RDW: 15.1 % (ref 11.5–15.5)
WBC: 4.1 K/uL (ref 4.0–10.5)
nRBC: 0 % (ref 0.0–0.2)

## 2024-03-01 LAB — BRAIN NATRIURETIC PEPTIDE: B Natriuretic Peptide: 326.2 pg/mL — ABNORMAL HIGH (ref 0.0–100.0)

## 2024-03-01 MED ORDER — TORSEMIDE 20 MG PO TABS: 20.0000 mg | ORAL_TABLET | Freq: Every day | ORAL | 3 refills | Status: DC

## 2024-03-01 MED ORDER — TORSEMIDE 20 MG PO TABS: ORAL_TABLET | ORAL | 3 refills | Status: DC

## 2024-03-01 NOTE — Progress Notes (Signed)
 ReDS Vest / Clip - 03/01/24 1000       ReDS Vest / Clip   Station Marker D    Ruler Value 38.5    ReDS Value Range Low volume    ReDS Actual Value 15

## 2024-03-01 NOTE — Telephone Encounter (Signed)
 Called patient per Joshua Gainer, NP with following:  Renal function elevated. Change torsemide  to 20 mg daily alternating with 10 mg every other day.   Make sure to wear compression hose daily. He has repeat labs arranged to follow renal function.  Pt verbalized understanding of same. No further questions at this time.

## 2024-03-01 NOTE — Patient Instructions (Addendum)
 Increase torsemide  to 20 mg daily. NEW RX SENT WITH AN INCREASE TO 20 MG TABLET. Labs today - will call you if abnormal. Repeat labs in 10 - 14 days. See below.  Please wear compression hose daily - Rx provided. Return to see Dr. Rolan in 3 months. CALL [phone] IN SEPTEMBER TO SCHEDULE THIS APPOINTMENT. Please call us  at [phone] if any questions or concerns prior to your next appointment.

## 2024-03-10 ENCOUNTER — Ambulatory Visit (HOSPITAL_COMMUNITY)
Admission: RE | Admit: 2024-03-10 | Discharge: 2024-03-10 | Disposition: A | Discharge status: Home / Self Care | Source: Ambulatory Visit | Attending: Cardiology | Admitting: Cardiology

## 2024-03-10 DIAGNOSIS — I5032 Chronic diastolic (congestive) heart failure: Secondary | ICD-10-CM | POA: Insufficient documentation

## 2024-03-10 LAB — BASIC METABOLIC PANEL WITH GFR
Anion gap: 10 (ref 5–15)
BUN: 32 mg/dL — ABNORMAL HIGH (ref 8–23)
CO2: 32 mmol/L (ref 22–32)
Calcium: 10.2 mg/dL (ref 8.9–10.3)
Chloride: 97 mmol/L — ABNORMAL LOW (ref 98–111)
Creatinine, Ser: 1.61 mg/dL — ABNORMAL HIGH (ref 0.61–1.24)
GFR, Estimated: 41 mL/min — ABNORMAL LOW (ref >60)
Glucose, Bld: 84 mg/dL (ref 70–99)
Potassium: 4.6 mmol/L (ref 3.5–5.1)
Sodium: 139 mmol/L (ref 135–145)

## 2024-03-17 ENCOUNTER — Ambulatory Visit: Admitting: Podiatry

## 2024-03-17 DIAGNOSIS — M79609 Pain in unspecified limb: Secondary | ICD-10-CM | POA: Diagnosis not present

## 2024-03-17 DIAGNOSIS — D689 Coagulation defect, unspecified: Secondary | ICD-10-CM | POA: Diagnosis not present

## 2024-03-17 DIAGNOSIS — L84 Corns and callosities: Secondary | ICD-10-CM

## 2024-03-17 DIAGNOSIS — E1169 Type 2 diabetes mellitus with other specified complication: Secondary | ICD-10-CM | POA: Diagnosis not present

## 2024-03-17 DIAGNOSIS — B351 Tinea unguium: Secondary | ICD-10-CM | POA: Diagnosis not present

## 2024-03-19 ENCOUNTER — Encounter: Payer: Self-pay | Admitting: Podiatry

## 2024-03-19 NOTE — Progress Notes (Signed)
  Subjective:  Patient ID: Joshua [name], male    DOB: 06/30/1936,  MRN: [id number]  ZAKARY KIMURA presents to clinic today for at risk foot care with history of diabetic neuropathy and preulcerative lesion(s) left foot and painful mycotic toenails that limit ambulation. Painful toenails interfere with ambulation. Aggravating factors include wearing enclosed shoe gear. Pain is relieved with periodic professional debridement. Painful preulcerative lesion(s) is/are aggravated when weightbearing with and without shoegear. Pain is relieved with periodic professional debridement.  Chief Complaint  Patient presents with   RFC     RFC diabetic toenail trim. LOV with PCP 12/2023   New problem(s): None.   PCP is Gerome Brunet, DO.  Allergies  Allergen Reactions   Other Swelling    Beer - swelling    Sunflower Oil Swelling   Sulfa Antibiotics Rash    Review of Systems: Negative except as noted in the HPI.  Objective: No changes noted in today's physical examination. There were no vitals filed for this visit. Joshua [name] is a pleasant 88 y.o. male in NAD. AAO x 3. On supplemental oxygen .  Vascular Examination: Capillary refill time immediate b/l. Vascular status intact b/l with palpable pedal pulses. Pedal hair present b/l. No pain with calf compression b/l. Skin temperature gradient WNL b/l. No cyanosis or clubbing b/l. No ischemia or gangrene noted b/l.   Neurological Examination: Sensation grossly intact b/l with 10 gram monofilament. Vibratory sensation intact b/l.   Dermatological Examination: Pedal skin with normal turgor, texture and tone b/l.  No open wounds. No interdigital macerations.   Toenails 1-5 b/l thick, discolored, elongated with subungual debris and pain on dorsal palpation.   Preulcerative lesion noted submet head 5 left foot. There is visible subdermal hemorrhage. There is no surrounding erythema, no edema, no drainage, no odor, no  fluctuance.  Musculoskeletal Examination: Muscle strength 5/5 to all lower extremity muscle groups bilaterally. Hammertoe(s) bilateral 5th toes.  Radiographs: None  Last A1c:      Latest Ref Rng & Units 10/10/2023    4:48 PM  Hemoglobin A1C  Hemoglobin-A1c 4.8 - 5.6 % 5.5    Assessment/Plan: 1. Pain due to onychomycosis of nail   2. Pre-ulcerative calluses   3. Coagulation disorder (HCC)   4. Type 2 diabetes mellitus with other specified complication, unspecified whether long term insulin  use [facility])   Consent given for treatment. All patient's and/or POA's questions/concerns addressed on today's visit. Toenails 1-5 debrided in length and girth without incident. Preulcerative lesion(s) submet head 5 left foot pared with sharp debridement without incident. Continue foot and shoe inspections daily. Monitor blood glucose per PCP/Endocrinologist's recommendations.Continue soft, supportive shoe gear daily. Report any pedal injuries to medical professional. Call office if there are any quesitons/concerns.  Return in about 3 months (around 06/17/2024).  Delon LITTIE Merlin, DPM      [place] LOCATION: [address] [place] [address]                   Office [phone]   [facility] LOCATION: [address] [place], [place] [address] Office [phone]

## 2024-03-20 ENCOUNTER — Other Ambulatory Visit: Payer: Self-pay | Admitting: Cardiology

## 2024-04-20 ENCOUNTER — Other Ambulatory Visit: Payer: Self-pay | Admitting: Nurse Practitioner

## 2024-04-20 DIAGNOSIS — K219 Gastro-esophageal reflux disease without esophagitis: Secondary | ICD-10-CM

## 2024-05-07 DIAGNOSIS — N1831 Chronic kidney disease, stage 3a: Secondary | ICD-10-CM | POA: Diagnosis not present

## 2024-05-07 DIAGNOSIS — M109 Gout, unspecified: Secondary | ICD-10-CM | POA: Diagnosis not present

## 2024-05-07 DIAGNOSIS — R634 Abnormal weight loss: Secondary | ICD-10-CM | POA: Diagnosis not present

## 2024-05-07 DIAGNOSIS — I10 Essential (primary) hypertension: Secondary | ICD-10-CM | POA: Diagnosis not present

## 2024-05-07 DIAGNOSIS — E119 Type 2 diabetes mellitus without complications: Secondary | ICD-10-CM | POA: Diagnosis not present

## 2024-05-14 DIAGNOSIS — N1832 Chronic kidney disease, stage 3b: Secondary | ICD-10-CM | POA: Diagnosis not present

## 2024-05-14 DIAGNOSIS — M109 Gout, unspecified: Secondary | ICD-10-CM | POA: Diagnosis not present

## 2024-05-14 DIAGNOSIS — I272 Pulmonary hypertension, unspecified: Secondary | ICD-10-CM | POA: Diagnosis not present

## 2024-05-14 DIAGNOSIS — K449 Diaphragmatic hernia without obstruction or gangrene: Secondary | ICD-10-CM | POA: Diagnosis not present

## 2024-05-14 DIAGNOSIS — Z86711 Personal history of pulmonary embolism: Secondary | ICD-10-CM | POA: Diagnosis not present

## 2024-05-14 DIAGNOSIS — Z23 Encounter for immunization: Secondary | ICD-10-CM | POA: Diagnosis not present

## 2024-05-14 DIAGNOSIS — E118 Type 2 diabetes mellitus with unspecified complications: Secondary | ICD-10-CM | POA: Diagnosis not present

## 2024-05-14 DIAGNOSIS — I5032 Chronic diastolic (congestive) heart failure: Secondary | ICD-10-CM | POA: Diagnosis not present

## 2024-05-14 DIAGNOSIS — J449 Chronic obstructive pulmonary disease, unspecified: Secondary | ICD-10-CM | POA: Diagnosis not present

## 2024-05-14 DIAGNOSIS — Z0000 Encounter for general adult medical examination without abnormal findings: Secondary | ICD-10-CM | POA: Diagnosis not present

## 2024-05-14 DIAGNOSIS — J9610 Chronic respiratory failure, unspecified whether with hypoxia or hypercapnia: Secondary | ICD-10-CM | POA: Diagnosis not present

## 2024-05-14 DIAGNOSIS — I2510 Atherosclerotic heart disease of native coronary artery without angina pectoris: Secondary | ICD-10-CM | POA: Diagnosis not present

## 2024-05-24 ENCOUNTER — Other Ambulatory Visit (HOSPITAL_COMMUNITY): Payer: Self-pay | Admitting: Cardiology

## 2024-05-24 MED ORDER — ATORVASTATIN CALCIUM 40 MG PO TABS: 40.0000 mg | ORAL_TABLET | Freq: Every day | ORAL | 2 refills | Status: AC

## 2024-05-27 ENCOUNTER — Ambulatory Visit (HOSPITAL_COMMUNITY)
Admission: RE | Admit: 2024-05-27 | Discharge: 2024-05-27 | Disposition: A | Discharge status: Home / Self Care | Source: Ambulatory Visit | Attending: Cardiology | Admitting: Cardiology

## 2024-05-27 ENCOUNTER — Ambulatory Visit (HOSPITAL_COMMUNITY): Payer: Self-pay | Admitting: Cardiology

## 2024-05-27 VITALS — BP 146/84 | HR 84 | Wt 186.0 lb

## 2024-05-27 DIAGNOSIS — I130 Hypertensive heart and chronic kidney disease with heart failure and stage 1 through stage 4 chronic kidney disease, or unspecified chronic kidney disease: Secondary | ICD-10-CM | POA: Insufficient documentation

## 2024-05-27 DIAGNOSIS — N183 Chronic kidney disease, stage 3 unspecified: Secondary | ICD-10-CM | POA: Insufficient documentation

## 2024-05-27 DIAGNOSIS — Z86711 Personal history of pulmonary embolism: Secondary | ICD-10-CM | POA: Insufficient documentation

## 2024-05-27 DIAGNOSIS — I82501 Chronic embolism and thrombosis of unspecified deep veins of right lower extremity: Secondary | ICD-10-CM | POA: Diagnosis not present

## 2024-05-27 DIAGNOSIS — J9610 Chronic respiratory failure, unspecified whether with hypoxia or hypercapnia: Secondary | ICD-10-CM | POA: Diagnosis not present

## 2024-05-27 DIAGNOSIS — J439 Emphysema, unspecified: Secondary | ICD-10-CM | POA: Insufficient documentation

## 2024-05-27 DIAGNOSIS — Z7984 Long term (current) use of oral hypoglycemic drugs: Secondary | ICD-10-CM | POA: Diagnosis not present

## 2024-05-27 DIAGNOSIS — Z79899 Other long term (current) drug therapy: Secondary | ICD-10-CM | POA: Diagnosis not present

## 2024-05-27 DIAGNOSIS — I5032 Chronic diastolic (congestive) heart failure: Secondary | ICD-10-CM

## 2024-05-27 DIAGNOSIS — I959 Hypotension, unspecified: Secondary | ICD-10-CM | POA: Diagnosis not present

## 2024-05-27 DIAGNOSIS — I2510 Atherosclerotic heart disease of native coronary artery without angina pectoris: Secondary | ICD-10-CM

## 2024-05-27 DIAGNOSIS — I272 Pulmonary hypertension, unspecified: Secondary | ICD-10-CM

## 2024-05-27 DIAGNOSIS — Z7901 Long term (current) use of anticoagulants: Secondary | ICD-10-CM | POA: Insufficient documentation

## 2024-05-27 DIAGNOSIS — I4510 Unspecified right bundle-branch block: Secondary | ICD-10-CM | POA: Diagnosis not present

## 2024-05-27 DIAGNOSIS — G4733 Obstructive sleep apnea (adult) (pediatric): Secondary | ICD-10-CM | POA: Insufficient documentation

## 2024-05-27 DIAGNOSIS — Z9981 Dependence on supplemental oxygen: Secondary | ICD-10-CM | POA: Insufficient documentation

## 2024-05-27 DIAGNOSIS — K2100 Gastro-esophageal reflux disease with esophagitis, without bleeding: Secondary | ICD-10-CM | POA: Diagnosis not present

## 2024-05-27 DIAGNOSIS — Z87891 Personal history of nicotine dependence: Secondary | ICD-10-CM | POA: Diagnosis not present

## 2024-05-27 LAB — BRAIN NATRIURETIC PEPTIDE: B Natriuretic Peptide: 244.6 pg/mL — ABNORMAL HIGH (ref 0.0–100.0)

## 2024-05-27 LAB — BASIC METABOLIC PANEL WITH GFR
Anion gap: 10 (ref 5–15)
BUN: 42 mg/dL — ABNORMAL HIGH (ref 8–23)
CO2: 34 mmol/L — ABNORMAL HIGH (ref 22–32)
Calcium: 10 mg/dL (ref 8.9–10.3)
Chloride: 94 mmol/L — ABNORMAL LOW (ref 98–111)
Creatinine, Ser: 2.01 mg/dL — ABNORMAL HIGH (ref 0.61–1.24)
GFR, Estimated: 32 mL/min — ABNORMAL LOW (ref >60)
Glucose, Bld: 101 mg/dL — ABNORMAL HIGH (ref 70–99)
Potassium: 4.5 mmol/L (ref 3.5–5.1)
Sodium: 138 mmol/L (ref 135–145)

## 2024-05-27 MED ORDER — TORSEMIDE 20 MG PO TABS: 20.0000 mg | ORAL_TABLET | Freq: Every day | ORAL | 3 refills | Status: DC

## 2024-05-27 NOTE — Progress Notes (Signed)
 PCP: Gerome Brunet, DO Cardiology: Dr. Wonda HF Cardiology: Dr. Rolan  Chief complaint: CHF  88 y.o. with history of HFPEF, prior PE, and pulmonary hypertension was referred by Dr. Santo for evaluation of pulmonary hypertension.  Patient had a PE in 2016.  He is on apixaban . He has a long history of diastolic CHF.  Most recent echo showed a significant component of RV failure with EF 55-60%, IV septum flattened, severe RV enlargement, severely decreased RV function, PASP 57 mmHg.  CTA chest in 4/22 showed no acute PE and mild emphysema, but V/Q scan in 5/22 was suggestive of chronic PE in the right middle lobe.  RHC in 4/22 showed normal filling pressures with moderate PAH. He additionally has chronic hiccups followed by Dr. Rollin, now on gabapentin . PFTs in 6/22 showed mixed picture with severe obstruction, moderate restriction, and severely decreased DLCO.   Admitted 8/25-8/30/22 with A/C HF exacerbation. He was aggressively diuresed with lasix /metolazone , had unna boots, farxiga  added.  Midodrine  was increased for hypotension. Hospitalization c/b AKI on CKD III.  Readmitted 9/1-9/12/22 with shock (mixed septic and cardiogenic), likely 2/2 to aspiration pneumonia and A/C CHF.  Suspect possible gut translocation with ileus/partial SBO; CT abdomen with nutcracker phenomenon.  He was started on NE + antibiotics. General surgery consulted and felt not to be a surgical candidate. Diuretics initially held due to over-diuresis and shock. Eventually able to wean pressors off, restart PO torsemide  and midodrine . Hospitalization c/b transaminitis, delirium, and BPH with urinary retention, requiring foley catheter. Palliative care was consulted for GOC and patient DNR/DNI. PT/OT recommended HH. He was discharged home, weight 220 lbs.  EGD 11/22 showed esophagitis, hiatal hernia and yeast.   Zio 11/22 showed 19 second run of VT.   Admitted 2/23 with PNA and a/c CHF. Seen by palliative care and  remained full code.  Admitted 8/23 with septic shock due to aspiration PNA & possible colitis. Echo showed EF 60-65%, RV severely reduced, severely enlarged, RVSP 50, LVEF 60-65%, no RWMA, mild TR, trivial MR. AHF consulted for elevated Hs trop. He underwent R/LHC showing nonobstructive mild CAD, normal/low filling pressures and mild PAH. Troponin elevation likely due to demand ischemia. Hospitalization c/b AKI on CKD. He was restarted on Farxiga  and lower dose torsemide  (10 mg daily). He was discharged home, weight 175 lbs.   He was admitted 11/23 with a/c respiratory failure, recently discharged (10/23)  after COVID-19 infection. Felt to be septic 2/2 PNA vs UTI. Received IV abx. HsTrop elevated on admission but felt to be demand ischemia. Stable from a HF standpoint and continued on torsemide , midodrine  and Farxiga . He was discharged home with HH, weight 188 lbs.  Echo 5/24 showed EF 60-65%, grade I DD, RV function moderately reduced and RV severely enlarged  Admitted in 7/24 with RLL PNA (aspiration likely) and COPD exacerbation.   Admitted 2/25 with CAP. Treated with abx, transiently required NE for BP support. Echo (2/25) showed EF 60-65%, D shaped septum suggesting cor pulmonale, RV severely reduced. Re-admitted 3/25 with re-current PNA and CHF. Diuresed, continued on midodrine . He was discharged home, weight 180 lbs.  Admitted 3/25 with sepsis due to CAP. Treated with abx. Diuretics transiently held due to AKI.   Today he returns for HF follow up with his wife. Weight down 1 lb. BP stable today on midodrine .  He was out of atorvastatin  for 4 weeks, just restarted it.  For several weeks now, he has noted chest tightness walking up stairs (not every time).  He also has noted central chest tightness when he walks fast. This happens a couple of times a week.  No chest pain at rest, pain is not pleuritic.  Pain resolves with rest.  No lightheadedness, palpitations, or syncope.  No orthopnea/PND.  He  gets short of breath chronically with moderate exertion (no change) and wears 3L home oxygen .   ECG (personally reviewed): NSR with RBBB  Labs (6/22): K 3.8, creatinine 1.72, ANA negative, RF 39.6 but CCP negative, SCL-70 negative, BNP 1231, HIV negative Labs (2/24): K 3.6, creatinine 1.64, hgb 11.4, BNP 90 Labs (5/24): K 3.7, creatinine 1.66 Labs (7/24): K 4.2, creatinine 1.07 => 1.08 Labs (11/24): LDL 21 Labs (3/25): K 3.6, creatinine 1.26  Labs (7/25): K 4.6, creatinine 1.61, BNP 326  6 minute walk (7/22): 213 m 6 minute walk (10/22): 274 m (oxygen  saturation dropped to 70s on 2L [place])  PMH: 1. HFpEF: With prominent RV failure.   - Echo (2/22): EF 55-60%, IV septum flattened, severe RV enlargement, severely decreased RV function, PASP 57 mmHg.  - Echo (8/23): EF 60-65%, RV severely reduced, severely enlarged, RVSP 50, LVEF 60-65%, no RWMA, mild TR, trivial MR.  - Echo (5/24): EF 60-65%, grade I DD, RV function moderately reduced and RV severely enlarged - Echo (2/25): EF 60-65%, D shaped septum suggesting cor pulmonale, RV severely reduced 2. Venous thromboembolic disease: PE in 2016.   - Venous dopplers (4/22): No DVT.  - CTA chest (4/22): No PE.  - V/Q scan 5/22 with perfusion defect in the RML consistent with chronic PE.  3. OSA: Did not tolerate CPAP.   4. HTN 5. COPD: Prior smoker.   - CTA chest in 4/22 showed no PE, mild emphysema.  - PFTs (6/22) with severe obstruction, moderate restriction, severely decreased DLCO 6. Pulmonary hypertension: RHC (4/22) with mean RA 5, PA 65/19 mean 36, mean PCWP 5, CI 2.19, PVR 6.1 WU, PAPi 9.2.  - RHC (8/23) with mean RA 1, PA 46/10 mean 25, mean PCWP 4, CI 2.98, PVR 3.4 WU 7. Chronic hiccups 8. BPH: Has to in and out cath at times. 9. Syncope: Zio 12/22 with 19 second VT run.  10. Chronic hiccups 11. 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 12. CAD: LHC (8/23) with  nonobstructive CAD.   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 Stopped working in  2022  Tobacco Use   Smoking status: Former    Current packs/day: 0.00    Average packs/day: 2.0 packs/day for 52.0 years (104.0 ttl pk-yrs)    Types: Cigarettes    Start date: 75    Quit date: 08/19/2006    Years since quitting: 17.7   Smokeless tobacco: Never   Tobacco comments:    Former smoke 03/15/22  Vaping Use   Vaping status: Never Used  Substance and Sexual Activity   Alcohol use: Not Currently    Alcohol/week: 6.0 standard drinks of alcohol    Types: 6 Shots of liquor per week    Comment: stopped in 2018   Drug use: No   Sexual activity: Not Currently  Other Topics Concern   Not on file  Social History Narrative  Not on file   Social Drivers of Health   Financial Resource Strain: Not on file  Food Insecurity: No Food Insecurity (11/18/2023)   Hunger Vital Sign    Worried About Running Out of Food in the Last Year: Never true    Ran Out of Food in the Last Year: Never true  Transportation Needs: No Transportation Needs (11/18/2023)   PRAPARE - Administrator, Civil Service (Medical): No    Lack of Transportation (Non-Medical): No  Recent Concern: Transportation Needs - Unmet Transportation Needs (10/10/2023)   PRAPARE - Transportation    Lack of Transportation (Medical): No    Lack of Transportation (Non-Medical): Yes  Physical Activity: Not on file  Stress: No Stress Concern Present (05/09/2021)   [employer] of Occupational Health - Occupational Stress Questionnaire    Feeling of Stress : Only a little  Social Connections: Socially Integrated (11/13/2023)   Social Connection and Isolation Panel    Frequency of  Communication with Friends and Family: Three times a week    Frequency of Social Gatherings with Friends and Family: Three times a week    Attends Religious Services: 1 to 4 times per year    Active Member of Clubs or Organizations: No    Attends Banker Meetings: 1 to 4 times per year    Marital Status: Married  Catering manager Violence: Not At Risk (11/18/2023)   Humiliation, Afraid, Rape, and Kick questionnaire    Fear of Current or Ex-Partner: No    Emotionally Abused: No    Physically Abused: No    Sexually Abused: No   Family History  Problem Relation Age of Onset   Heart attack Brother 57   ROS: All systems reviewed and negative except as per HPI.   Current Outpatient Medications  Medication Sig Dispense Refill   ADEMPAS  2 MG TABS TAKE 1 TABLET BY MOUTH 3 TIMES A DAY AS DIRECTED. DO NOT HANDLE IF PREGNANT 90 tablet 11   albuterol  (VENTOLIN  HFA) 108 (90 Base) MCG/ACT inhaler Inhale 1 puff into the lungs every 4 (four) hours as needed for shortness of breath.     allopurinol  (ZYLOPRIM ) 100 MG tablet Take 100 mg by mouth every morning.     atorvastatin  (LIPITOR ) 40 MG tablet Take 1 tablet (40 mg total) by mouth daily. 90 tablet 2   azelastine  (ASTELIN ) 0.1 % nasal spray Place 2 sprays into both nostrils 2 (two) times daily as needed for rhinitis. Use in each nostril as directed     CINNAMON  PO Take 1 capsule by mouth daily.     ELIQUIS  5 MG TABS tablet TAKE 1 TABLET BY MOUTH TWICE A DAY 60 tablet 3   FARXIGA  10 MG TABS tablet TAKE 1 TABLET BY MOUTH DAILY BEFORE BREAKFAST. 90 tablet 3   finasteride  (PROSCAR ) 5 MG tablet Take 5 mg by mouth daily.     gabapentin  (NEURONTIN ) 300 MG capsule Take 300 mg by mouth 3 (three) times daily.     magnesium  oxide (MAG-OX) 400 (240 Mg) MG tablet TAKE 1 TABLET BY MOUTH EVERY DAY 90 tablet 1   metoCLOPramide  (REGLAN ) 5 MG tablet Take 5 mg by mouth 3 (three) times daily with meals as needed for nausea or vomiting.     midodrine   (PROAMATINE ) 5 MG tablet Take 3 tablets (15 mg total) by mouth 3 (three) times daily with meals. 270 tablet 8   Multiple Vitamin (MULTIVITAMIN WITH MINERALS) TABS tablet Take 1 tablet by mouth  daily. 30 tablet 0   OXYGEN  Inhale 3 L/min into the lungs continuous.     pantoprazole  (PROTONIX ) 40 MG tablet TAKE 1 TABLET BY MOUTH EVERY DAY IN THE MORNING 90 tablet 1   potassium chloride  (KLOR-CON ) 10 MEQ tablet Take 1 tablet (10 mEq total) by mouth 2 (two) times daily. 60 tablet 11   Tiotropium Bromide -Olodaterol (STIOLTO RESPIMAT ) 2.5-2.5 MCG/ACT AERS Inhale 2 puffs into the lungs daily. 12 g 1   torsemide  (DEMADEX ) 20 MG tablet Take 1 tablet (20 mg total) by mouth daily. Take 20 mg alternating with 10 mg daily 45 tablet 3   No current facility-administered medications for this encounter.   Wt Readings from Last 3 Encounters:  05/27/24 84.4 kg (186 lb)  03/01/24 85.2 kg (187 lb 12.8 oz)  11/13/23 78.9 kg (174 lb)   BP (!) 146/84   Pulse 84   Wt 84.4 kg (186 lb)   SpO2 90% Comment: Patient is on 3-liters of room oxygen .  BMI 23.25 kg/m  General: NAD Neck: No JVD, no thyromegaly or thyroid  nodule.  Lungs: Distant BS CV: Nondisplaced PMI.  Heart regular S1/S2, no S3/S4, no murmur.  No peripheral edema.  No carotid bruit.  Normal pedal pulses.  Abdomen: Soft, nontender, no hepatosplenomegaly, no distention.  Skin: Intact without lesions or rashes.  Neurologic: Alert and oriented x 3.  Psych: Normal affect. Extremities: No clubbing or cyanosis.  HEENT: Normal.   Assessment/Plan: 1. Chronic HFpEF/RV failure: Echo (2/22) with EF 55-60%, IV septum flattened, severe RV enlargement, severely decreased RV function, PASP 57 mmHg.  He has severe RV failure at baseline.  Echo this admit 8/23 >>RV severely reduced, severely enlarged, RVSP 50, LVEF 60-65%, no RWMA, mild TR, trivial MR.  RHC 8/23 showed normal/low filling pressures and mild PAH.  Echo 5/24 showed EF 60-65%, RV moderately reduced. Echo in  2/25 showed EF 55% and severely enlarged RV w/ severely reduced function. NYHA class III chronically, not volume overloaded on exam. BP stable with midodrine , no orthostatic symptoms.  - Continue torsemide  20 mg daily. BMET/BNP today. - Continue Farxiga  10 mg daily. - Continue midodrine  15 mg tid. Would avoid fludrocortisone. 2. Pulmonary HTN: PAH noted on 4/22 RHC with PVR 6.1 WU.  This appears to be multifactorial with OSA, severe emphysema, and a suspected chronic PE involving the right middle lobe (WHO group 3 and group 4 PH). Given the suspected mixed etiology with only 1 area of chronic thromboembolism (right middle lobe) as well as age, do not think that pulmonary thromboendarterectomy would be indicated.  Rheumatologic serologic workup was negative.  PFTs showed severe obstruction and moderate restriction, suggesting significant COPD. RHC 8/23 showed mild pulmonary hypertension with PVR 3.4 WU.  - Continue riociguat .  - Holding off Tyvaso for now, suspect large group 3 PH component and only mildly elevated PA pressure on 8/23 RHC.  3. Chronic respiratory failure: COPD.  Admission 10/23 for COVID. 7/24, 2/25, and 3/25 (x2) admission for PNA.  - Stable on 3L home oxygen .  4. CKD III: Continue SGLT2i. BMET today. 5. CAD: nonobstructive on [facility] 8/23. For the last few weeks, he has had occasional episodes of exertional chest pain relieved with rest.  No chest pain at rest.  Pain is not pleuritic, not suggestive of recurrent PE.  - No ASA with need for apixaban .  - He has just restarted his statin, check lipids in 2 months.  - With chest pain, will arrange for cardiac PET.  Would have  relatively high threshold for cath given CKD.  6. OSA: Moderate OSA on sleep study. He did not tolerate CPAP.  - Continue oxygen  7. Emphysema: Prior smoker.  Emphysema on CT and severe obstruction on PFTs. On 3L home oxygen . 8. Chronic PE: Diagnosed by V/Q scan.  No abnormal bleeding. He has a chronic RLE DVT.   -  Continue Eliquis  5 mg bid, may need to decrease dose if creatinine remains elevated.  9. Hiatial hernia w/ esophagitis: Has significant GERD. He follows with Dr. Rollin. - Continue PPI.  10. Nutcracker SMA: Compresses duodenum - No change  Followup 6 wks with APP.  I spent 32 minutes reviewing data, interviewing patient, and organizing the orders/followup.   Ezra Shuck  05/27/24

## 2024-05-27 NOTE — Patient Instructions (Signed)
 There has been no changes to your medications.  Labs done today, your results will be available in MyChart, we will contact you for abnormal readings.     Please report to Radiology at the [facility] Main Entrance 30 minutes early for your test.  [address] [place], [place] [address]                         OR   Please report to Radiology at [facility] Main Entrance, medical mall, 30 mins prior to your test.  [address]  [place], [place]  How to Prepare for Your Cardiac PET/CT Stress Test:  Nothing to eat or drink, except water, 3 hours prior to arrival time.  NO caffeine/decaffeinated products, or chocolate 12 hours prior to arrival. (Please note decaffeinated beverages (teas/coffees) still contain caffeine).  If you have caffeine within 12 hours prior, the test will need to be rescheduled.  Medication instructions: Do not take erectile dysfunction medications for 72 hours prior to test (sildenafil, tadalafil) Do not take nitrates (isosorbide mononitrate, Ranexa) the day before or day of test Do not take tamsulosin  the day before or morning of test Hold theophylline containing medications for 12 hours. Hold Dipyridamole 48 hours prior to the test.  Diabetic Preparation: If able to eat breakfast prior to 3 hour fasting, you may take all medications, including your insulin . Do not worry if you miss your breakfast dose of insulin  - start at your next meal. If you do not eat prior to 3 hour fast-Hold all diabetes (oral and insulin ) medications. Patients who wear a continuous glucose monitor MUST remove the device prior to scanning.  You may take your remaining medications with water.  NO perfume, cologne or lotion on chest or abdomen area. FEMALES - Please avoid wearing dresses to this appointment.  Total time is 1 to 2 hours; you may want to bring reading material for the waiting time.  IF YOU THINK YOU MAY BE PREGNANT, OR ARE  NURSING PLEASE INFORM THE TECHNOLOGIST.  In preparation for your appointment, medication and supplies will be purchased.  Appointment availability is limited, so if you need to cancel or reschedule, please call the Radiology Department Scheduler at [phone] 24 hours in advance to avoid a cancellation fee of $100.00  What to Expect When you Arrive:  Once you arrive and check in for your appointment, you will be taken to a preparation room within the Radiology Department.  A technologist or Nurse will obtain your medical history, verify that you are correctly prepped for the exam, and explain the procedure.  Afterwards, an IV will be started in your arm and electrodes will be placed on your skin for EKG monitoring during the stress portion of the exam. Then you will be escorted to the PET/CT scanner.  There, staff will get you positioned on the scanner and obtain a blood pressure and EKG.  During the exam, you will continue to be connected to the EKG and blood pressure machines.  A small, safe amount of a radioactive tracer will be injected in your IV to obtain a series of pictures of your heart along with an injection of a stress agent.    After your Exam:  It is recommended that you eat a meal and drink a caffeinated beverage to counter act any effects of the stress agent.  Drink plenty of fluids for the remainder of the day and urinate frequently  for the first couple of hours after the exam.  Your doctor will inform you of your test results within 7-10 business days.  For more information and frequently asked questions, please visit our website: [email or web address]  For questions about your test or how to prepare for your test, please call: Cardiac Imaging Nurse Navigators Office: [phone]  Your physician recommends that you schedule a follow-up appointment in: 6 weeks.  If you have any questions or concerns before your next appointment please send us  a message through  [place] or call our office at [phone].    TO LEAVE A MESSAGE FOR THE NURSE SELECT OPTION 2, PLEASE LEAVE A MESSAGE INCLUDING: YOUR NAME DATE OF BIRTH CALL BACK NUMBER REASON FOR CALL**this is important as we prioritize the call backs  YOU WILL RECEIVE A CALL BACK THE SAME DAY AS LONG AS YOU CALL BEFORE 4:00 PM  At the [facility], you and your health needs are our priority. As part of our continuing mission to provide you with exceptional heart care, we have created designated Provider Care Teams. These Care Teams include your primary Cardiologist (physician) and Advanced Practice Providers (APPs- Physician Assistants and Nurse Practitioners) who all work together to provide you with the care you need, when you need it.   You may see any of the following providers on your designated Care Team at your next follow up: Dr Toribio Fuel Dr Ezra Shuck Dr. Ria Commander Dr. Morene Brownie Amy Lenetta, NP Caffie Shed, [place] [facility] [place], [place] Beckey Coe, NP [place], NP Ellouise Class, NP Tinnie Redman, PharmD Jaun Bash, PharmD   Please be sure to bring in all your medications bottles to every appointment.    Thank you for choosing [facility]

## 2024-05-28 MED ORDER — TORSEMIDE 20 MG PO TABS: 20.0000 mg | ORAL_TABLET | ORAL | Status: DC

## 2024-05-28 NOTE — Addendum Note (Signed)
 Encounter addended by: Marcelina Lisa HERO, RN on: 05/28/2024 8:08 AM  Actions taken: Order list changed

## 2024-05-28 NOTE — Addendum Note (Signed)
 Encounter addended by: Rolan Ezra RAMAN, MD on: 05/28/2024 8:17 AM  Actions taken: Order list changed, Diagnosis association updated

## 2024-05-31 ENCOUNTER — Encounter (HOSPITAL_COMMUNITY): Payer: Self-pay

## 2024-05-31 ENCOUNTER — Telehealth (HOSPITAL_COMMUNITY): Payer: Self-pay | Admitting: Emergency Medicine

## 2024-05-31 NOTE — Telephone Encounter (Signed)
 Reaching out to patient to offer assistance regarding upcoming cardiac imaging study; pt verbalizes understanding of appt date/time, parking situation and where to check in, pre-test NPO status and medications ordered, and verified current allergies; name and call back number provided for further questions should they arise Rockwell Alexandria RN Navigator Cardiac Imaging [facility] [phone] office [phone] cell

## 2024-06-01 ENCOUNTER — Ambulatory Visit (HOSPITAL_COMMUNITY)
Admission: RE | Admit: 2024-06-01 | Discharge: 2024-06-01 | Disposition: A | Discharge status: Home / Self Care | Source: Ambulatory Visit | Attending: Cardiology | Admitting: Cardiology

## 2024-06-01 DIAGNOSIS — I5032 Chronic diastolic (congestive) heart failure: Secondary | ICD-10-CM | POA: Insufficient documentation

## 2024-06-01 LAB — NM PET CT CARDIAC PERFUSION MULTI W/ABSOLUTE BLOODFLOW
LV dias vol: 98 mL (ref 62–150)
MBFR: 2.54
Nuc Rest EF: 58 %
Nuc Stress EF: 66 %
Peak HR: 78 {beats}/min
Rest HR: 75 {beats}/min
Rest MBF: 1 ml/g/min
Rest Nuclear Isotope Dose: 22.2 mCi
ST Depression (mm): 0 mm
Stress MBF: 2.54 ml/g/min
Stress Nuclear Isotope Dose: 22.2 mCi
TID: 1.09

## 2024-06-01 MED ORDER — REGADENOSON 0.4 MG/5ML IV SOLN
0.4000 mg | Freq: Once | INTRAVENOUS | Status: AC
  Administered 2024-06-01: 0.4 mg via INTRAVENOUS

## 2024-06-01 MED ORDER — RUBIDIUM RB82 GENERATOR (RUBYFILL)
22.1500 | PACK | Freq: Once | INTRAVENOUS | Status: AC
  Administered 2024-06-01: 22.15 via INTRAVENOUS

## 2024-06-01 MED ORDER — REGADENOSON 0.4 MG/5ML IV SOLN
INTRAVENOUS | Status: AC
Start: 2024-06-01 — End: 2024-06-01
  Filled 2024-06-01: qty 5

## 2024-06-01 MED ORDER — RUBIDIUM RB82 GENERATOR (RUBYFILL)
22.2400 | PACK | Freq: Once | INTRAVENOUS | Status: AC
  Administered 2024-06-01: 22.24 via INTRAVENOUS

## 2024-06-01 NOTE — Progress Notes (Signed)
 Pt. Tolerated lexi scan well.

## 2024-06-03 NOTE — Telephone Encounter (Addendum)
 Pt aware, agreeable, and verbalized understanding   ----- Message from Ezra Shuck sent at 06/01/2024  4:27 PM EDT ----- LV EF 58%, no evidence for ischemia.  Low risk study.  Would not recommend coronary angiography.  ----- Message ----- From: Interface, Rad Results In Sent: 06/01/2024  12:40 PM EDT To: Ezra GORMAN Shuck, MD

## 2024-06-07 ENCOUNTER — Other Ambulatory Visit (HOSPITAL_COMMUNITY)

## 2024-06-09 ENCOUNTER — Ambulatory Visit (HOSPITAL_COMMUNITY)
Admission: RE | Admit: 2024-06-09 | Discharge: 2024-06-09 | Disposition: A | Discharge status: Home / Self Care | Source: Ambulatory Visit | Attending: Cardiology | Admitting: Cardiology

## 2024-06-09 ENCOUNTER — Ambulatory Visit (HOSPITAL_COMMUNITY): Payer: Self-pay | Admitting: Cardiology

## 2024-06-09 DIAGNOSIS — I5032 Chronic diastolic (congestive) heart failure: Secondary | ICD-10-CM

## 2024-06-09 LAB — BASIC METABOLIC PANEL WITH GFR
Anion gap: 12 (ref 5–15)
BUN: 27 mg/dL — ABNORMAL HIGH (ref 8–23)
CO2: 30 mmol/L (ref 22–32)
Calcium: 9.8 mg/dL (ref 8.9–10.3)
Chloride: 99 mmol/L (ref 98–111)
Creatinine, Ser: 1.74 mg/dL — ABNORMAL HIGH (ref 0.61–1.24)
GFR, Estimated: 38 mL/min — ABNORMAL LOW (ref >60)
Glucose, Bld: 87 mg/dL (ref 70–99)
Potassium: 5.2 mmol/L — ABNORMAL HIGH (ref 3.5–5.1)
Sodium: 141 mmol/L (ref 135–145)

## 2024-06-21 ENCOUNTER — Ambulatory Visit (HOSPITAL_COMMUNITY)
Admission: RE | Admit: 2024-06-21 | Discharge: 2024-06-21 | Disposition: A | Source: Ambulatory Visit | Attending: Cardiology

## 2024-06-21 ENCOUNTER — Ambulatory Visit (HOSPITAL_COMMUNITY): Payer: Self-pay | Admitting: Cardiology

## 2024-06-21 DIAGNOSIS — I5032 Chronic diastolic (congestive) heart failure: Secondary | ICD-10-CM

## 2024-06-21 LAB — BASIC METABOLIC PANEL WITH GFR
Anion gap: 11 (ref 5–15)
BUN: 34 mg/dL — ABNORMAL HIGH (ref 8–23)
CO2: 30 mmol/L (ref 22–32)
Calcium: 9.6 mg/dL (ref 8.9–10.3)
Chloride: 101 mmol/L (ref 98–111)
Creatinine, Ser: 2.15 mg/dL — ABNORMAL HIGH (ref 0.61–1.24)
GFR, Estimated: 29 mL/min — ABNORMAL LOW (ref >60)
Glucose, Bld: 99 mg/dL (ref 70–99)
Potassium: 4.8 mmol/L (ref 3.5–5.1)
Sodium: 142 mmol/L (ref 135–145)

## 2024-06-30 ENCOUNTER — Encounter: Payer: Self-pay | Admitting: Podiatry

## 2024-06-30 ENCOUNTER — Ambulatory Visit: Admitting: Podiatry

## 2024-06-30 DIAGNOSIS — B351 Tinea unguium: Secondary | ICD-10-CM

## 2024-06-30 DIAGNOSIS — L84 Corns and callosities: Secondary | ICD-10-CM

## 2024-06-30 DIAGNOSIS — I2510 Atherosclerotic heart disease of native coronary artery without angina pectoris: Secondary | ICD-10-CM | POA: Insufficient documentation

## 2024-06-30 DIAGNOSIS — D689 Coagulation defect, unspecified: Secondary | ICD-10-CM | POA: Diagnosis not present

## 2024-06-30 DIAGNOSIS — E118 Type 2 diabetes mellitus with unspecified complications: Secondary | ICD-10-CM | POA: Insufficient documentation

## 2024-06-30 DIAGNOSIS — E1169 Type 2 diabetes mellitus with other specified complication: Secondary | ICD-10-CM

## 2024-06-30 DIAGNOSIS — M79609 Pain in unspecified limb: Secondary | ICD-10-CM

## 2024-06-30 DIAGNOSIS — N401 Enlarged prostate with lower urinary tract symptoms: Secondary | ICD-10-CM | POA: Insufficient documentation

## 2024-06-30 NOTE — Progress Notes (Signed)
 PCP: Gerome Brunet, DO Cardiology: Dr. Wonda HF Cardiology: Dr. Rolan  88 y.o. with history of HFPEF, prior PE, and pulmonary hypertension was referred by Dr. Santo for evaluation of pulmonary hypertension.  Patient had a PE in 2016.  He is on apixaban . He has a long history of diastolic CHF.  Most recent echo showed a significant component of RV failure with EF 55-60%, IV septum flattened, severe RV enlargement, severely decreased RV function, PASP 57 mmHg.  CTA chest in 4/22 showed no acute PE and mild emphysema, but V/Q scan in 5/22 was suggestive of chronic PE in the right middle lobe.  RHC in 4/22 showed normal filling pressures with moderate PAH. He additionally has chronic hiccups followed by Dr. Rollin, now on gabapentin . PFTs in 6/22 showed mixed picture with severe obstruction, moderate restriction, and severely decreased DLCO.   Admitted 8/25-8/30/22 with A/C HF exacerbation. He was aggressively diuresed with lasix /metolazone , had unna boots, farxiga  added.  Midodrine  was increased for hypotension. Hospitalization c/b AKI on CKD III.  Readmitted 9/1-9/12/22 with shock (mixed septic and cardiogenic), likely 2/2 to aspiration pneumonia and A/C CHF.  Suspect possible gut translocation with ileus/partial SBO; CT abdomen with nutcracker phenomenon.  He was started on NE + antibiotics. General surgery consulted and felt not to be a surgical candidate. Diuretics initially held due to over-diuresis and shock. Eventually able to wean pressors off, restart PO torsemide  and midodrine . Hospitalization c/b transaminitis, delirium, and BPH with urinary retention, requiring foley catheter. Palliative care was consulted for GOC and patient DNR/DNI. PT/OT recommended HH. He was discharged home, weight 220 lbs.  EGD 11/22 showed esophagitis, hiatal hernia and yeast.   Zio 11/22 showed 19 second run of VT.   Admitted 2/23 with PNA and a/c CHF. Seen by palliative care and remained full  code.  Admitted 8/23 with septic shock due to aspiration PNA & possible colitis. Echo showed EF 60-65%, RV severely reduced, severely enlarged, RVSP 50, LVEF 60-65%, no RWMA, mild TR, trivial MR. AHF consulted for elevated Hs trop. He underwent R/LHC showing nonobstructive mild CAD, normal/low filling pressures and mild PAH. Troponin elevation likely due to demand ischemia. Hospitalization c/b AKI on CKD. He was restarted on Farxiga  and lower dose torsemide  (10 mg daily). He was discharged home, weight 175 lbs.   He was admitted 11/23 with a/c respiratory failure, recently discharged (10/23)  after COVID-19 infection. Felt to be septic 2/2 PNA vs UTI. Received IV abx. HsTrop elevated on admission but felt to be demand ischemia. Stable from a HF standpoint and continued on torsemide , midodrine  and Farxiga . He was discharged home with HH, weight 188 lbs.  Echo 5/24 showed EF 60-65%, grade I DD, RV function moderately reduced and RV severely enlarged  Admitted in 7/24 with RLL PNA (aspiration likely) and COPD exacerbation.   Admitted 2/25 with CAP. Treated with abx, transiently required NE for BP support. Echo (2/25) showed EF 60-65%, D shaped septum suggesting cor pulmonale, RV severely reduced. Re-admitted 3/25 with re-current PNA and CHF. Diuresed, continued on midodrine . He was discharged home, weight 180 lbs.  Admitted 3/25 with sepsis due to CAP. Treated with abx. Diuretics transiently held due to AKI.   Had atypical chest pain and underwent cardiac PET 10/25 which showed EF 58%, no evidence for ischemia, low risk study.  Today he returns for HF follow up with his wife. Overall feeling fair. He is more SOB now; he has more dyspnea walking further distances on flat ground or up steps.  He does OK with ADLs. He is swelling in legs. He wears 2-3L oxygen  continuously. He feels occasional chest squeeze when walking up steps. Denies palpitations, abnormal bleeding, dizziness, or PND/Orthopnea. Appetite  ok. Weight at home 187 pounds. Taking all medications.  .  ECG (personally reviewed): none ordered today.  Labs (6/22): K 3.8, creatinine 1.72, ANA negative, RF 39.6 but CCP negative, SCL-70 negative, BNP 1231, HIV negative Labs (2/24): K 3.6, creatinine 1.64, hgb 11.4, BNP 90 Labs (5/24): K 3.7, creatinine 1.66 Labs (7/24): K 4.2, creatinine 1.07 => 1.08 Labs (11/24): LDL 21 Labs (3/25): K 3.6, creatinine 1.26  Labs (7/25): K 4.6, creatinine 1.61, BNP 326 Labs (11/25): K 4.8, creatinine 2.15  6 minute walk (7/22): 213 m 6 minute walk (10/22): 274 m (oxygen  saturation dropped to 70s on 2L [place])  PMH: 1. HFpEF: With prominent RV failure.   - Echo (2/22): EF 55-60%, IV septum flattened, severe RV enlargement, severely decreased RV function, PASP 57 mmHg.  - Echo (8/23): EF 60-65%, RV severely reduced, severely enlarged, RVSP 50, LVEF 60-65%, no RWMA, mild TR, trivial MR.  - Echo (5/24): EF 60-65%, grade I DD, RV function moderately reduced and RV severely enlarged - Echo (2/25): EF 60-65%, D shaped septum suggesting cor pulmonale, RV severely reduced 2. Venous thromboembolic disease: PE in 2016.   - Venous dopplers (4/22): No DVT.  - CTA chest (4/22): No PE.  - V/Q scan 5/22 with perfusion defect in the RML consistent with chronic PE.  3. OSA: Did not tolerate CPAP.   4. HTN 5. COPD: Prior smoker.   - CTA chest in 4/22 showed no PE, mild emphysema.  - PFTs (6/22) with severe obstruction, moderate restriction, severely decreased DLCO 6. Pulmonary hypertension: RHC (4/22) with mean RA 5, PA 65/19 mean 36, mean PCWP 5, CI 2.19, PVR 6.1 WU, PAPi 9.2.  - RHC (8/23) with mean RA 1, PA 46/10 mean 25, mean PCWP 4, CI 2.98, PVR 3.4 WU 7. Chronic hiccups 8. BPH: Has to in and out cath at times. 9. Syncope: Zio 12/22 with 19 second VT run.  10. Chronic hiccups 11. 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 12. CAD: LHC  (8/23) with nonobstructive CAD.  - Cardiac PET (10/25): low risk study, no evidence for ischemia; LVEF 58%  Social History   Socioeconomic History   Marital status: Married    Spouse name: Joann   Number of children: 2   Years of education: 14   Highest education level: Not on file  Occupational History   Occupation: postal service,[employer] there,school crossing guard. Stopped working in  2022  Tobacco Use   Smoking status: Former    Current packs/day: 0.00    Average packs/day: 2.0 packs/day for 52.0 years (104.0 ttl pk-yrs)    Types: Cigarettes    Start date: 94    Quit date: 08/19/2006    Years since quitting: 17.8   Smokeless tobacco: Never   Tobacco comments:    Former smoke 03/15/22  Vaping Use   Vaping status: Never Used  Substance and Sexual Activity   Alcohol use: Not Currently    Alcohol/week: 6.0 standard drinks of alcohol    Types: 6 Shots of liquor per week    Comment: stopped in 2018   Drug use: No   Sexual activity: Not Currently  Other Topics Concern   Not on file  Social History Narrative  Not on file   Social Drivers of Health   Financial Resource Strain: Not on file  Food Insecurity: No Food Insecurity (11/18/2023)   Hunger Vital Sign    Worried About Running Out of Food in the Last Year: Never true    Ran Out of Food in the Last Year: Never true  Transportation Needs: No Transportation Needs (11/18/2023)   PRAPARE - Administrator, Civil Service (Medical): No    Lack of Transportation (Non-Medical): No  Recent Concern: Transportation Needs - Unmet Transportation Needs (10/10/2023)   PRAPARE - Transportation    Lack of Transportation (Medical): No    Lack of Transportation (Non-Medical): Yes  Physical Activity: Not on file  Stress: No Stress Concern Present (05/09/2021)   [employer] of Occupational Health - Occupational Stress Questionnaire    Feeling of Stress : Only a little  Social Connections: Socially  Integrated (11/13/2023)   Social Connection and Isolation Panel    Frequency of Communication with Friends and Family: Three times a week    Frequency of Social Gatherings with Friends and Family: Three times a week    Attends Religious Services: 1 to 4 times per year    Active Member of Clubs or Organizations: No    Attends Banker Meetings: 1 to 4 times per year    Marital Status: Married  Catering Manager Violence: Not At Risk (11/18/2023)   Humiliation, Afraid, Rape, and Kick questionnaire    Fear of Current or Ex-Partner: No    Emotionally Abused: No    Physically Abused: No    Sexually Abused: No   Family History  Problem Relation Age of Onset   Heart attack Brother 57   ROS: All systems reviewed and negative except as per HPI.   Current Outpatient Medications  Medication Sig Dispense Refill   ADEMPAS  2 MG TABS TAKE 1 TABLET BY MOUTH 3 TIMES A DAY AS DIRECTED. DO NOT HANDLE IF PREGNANT 90 tablet 11   albuterol  (VENTOLIN  HFA) 108 (90 Base) MCG/ACT inhaler Inhale 1 puff into the lungs every 4 (four) hours as needed for shortness of breath.     allopurinol  (ZYLOPRIM ) 100 MG tablet Take 100 mg by mouth every morning.     atorvastatin  (LIPITOR ) 40 MG tablet Take 1 tablet (40 mg total) by mouth daily. 90 tablet 2   azelastine  (ASTELIN ) 0.1 % nasal spray Place 2 sprays into both nostrils 2 (two) times daily as needed for rhinitis. Use in each nostril as directed     CINNAMON  PO Take 1 capsule by mouth daily.     ELIQUIS  5 MG TABS tablet TAKE 1 TABLET BY MOUTH TWICE A DAY 60 tablet 3   FARXIGA  10 MG TABS tablet TAKE 1 TABLET BY MOUTH DAILY BEFORE BREAKFAST. 90 tablet 3   finasteride  (PROSCAR ) 5 MG tablet Take 5 mg by mouth daily.     gabapentin  (NEURONTIN ) 300 MG capsule Take 300 mg by mouth 3 (three) times daily.     magnesium  oxide (MAG-OX) 400 (240 Mg) MG tablet TAKE 1 TABLET BY MOUTH EVERY DAY 90 tablet 1   metoCLOPramide  (REGLAN ) 5 MG tablet Take 5 mg by mouth 3 (three)  times daily with meals as needed for nausea or vomiting.     midodrine  (PROAMATINE ) 5 MG tablet Take 3 tablets (15 mg total) by mouth 3 (three) times daily with meals. 270 tablet 8   Multiple Vitamin (MULTIVITAMIN WITH MINERALS) TABS tablet Take 1 tablet by mouth  daily. 30 tablet 0   OXYGEN  Inhale 3 L/min into the lungs continuous.     pantoprazole  (PROTONIX ) 40 MG tablet TAKE 1 TABLET BY MOUTH EVERY DAY IN THE MORNING 90 tablet 1   Tiotropium Bromide -Olodaterol (STIOLTO RESPIMAT ) 2.5-2.5 MCG/ACT AERS Inhale 2 puffs into the lungs daily. 12 g 1   torsemide  (DEMADEX ) 20 MG tablet Take 1 tablet (20 mg total) by mouth every other day.     No current facility-administered medications for this encounter.   Wt Readings from Last 3 Encounters:  07/06/24 87.1 kg (192 lb)  05/27/24 84.4 kg (186 lb)  03/01/24 85.2 kg (187 lb 12.8 oz)   BP 118/66   Pulse 76   Ht 6' 3 (1.905 m)   Wt 87.1 kg (192 lb)   SpO2 93%   BMI 24.00 kg/m  Physical Exam General:  NAD. No resp difficulty, walked into clinic with cane on oxygen  HEENT: Normal Neck: Supple. JVP 10 Cor: Regular rate & rhythm. No rubs, gallops or murmurs. Lungs: Clear, diminished in bases,  Abdomen: Soft, nontender, nondistended.  Extremities: No cyanosis, clubbing, rash, 2+ BLE pre-tibial edema Neuro: Alert & oriented x 3, moves all 4 extremities w/o difficulty. Affect pleasant.  Assessment/Plan: 1. Chronic HFpEF/RV failure: Echo (2/22) with EF 55-60%, IV septum flattened, severe RV enlargement, severely decreased RV function, PASP 57 mmHg.  He has severe RV failure at baseline.  Echo this admit 8/23 >>RV severely reduced, severely enlarged, RVSP 50, LVEF 60-65%, no RWMA, mild TR, trivial MR.  RHC 8/23 showed normal/low filling pressures and mild PAH.  Echo 5/24 showed EF 60-65%, RV moderately reduced. Echo in 2/25 showed EF 55% and severely enlarged RV w/ severely reduced function. NYHA class III chronically, but worse recently. He is mildly  volume overloaded by exam. BP stable with midodrine , no orthostatic symptoms.  - Increase torsemide  20 mg to daily, restart 10 KCL daily. Recent labs reviewed and are stable. Repeat BMET in 10-14 days. Suspect we will need to tolerate mildly worsening renal function to get volume/symptoms under control.  - Continue Farxiga  10 mg daily. - Continue midodrine  15 mg tid. Would avoid fludrocortisone. - Continue compression hose. 2. Pulmonary HTN: PAH noted on 4/22 RHC with PVR 6.1 WU.  This appears to be multifactorial with OSA, severe emphysema, and a suspected chronic PE involving the right middle lobe (WHO group 3 and group 4 PH). Given the suspected mixed etiology with only 1 area of chronic thromboembolism (right middle lobe) as well as age, do not think that pulmonary thromboendarterectomy would be indicated.  Rheumatologic serologic workup was negative.  PFTs showed severe obstruction and moderate restriction, suggesting significant COPD. RHC 8/23 showed mild pulmonary hypertension with PVR 3.4 WU.  - Continue riociguat .  - Holding off Tyvaso for now, suspect large group 3 PH component and only mildly elevated PA pressure on 8/23 RHC.  3. Chronic respiratory failure: COPD.  Admission 10/23 for COVID. 7/24, 2/25, and 3/25 (x2) admission for PNA.  - Stable on 2-3L home oxygen .  4. CKD III: Continue SGLT2i. Last SCr 2.15.  - Suspect new SCr baseline ~ 2. 5. CAD: nonobstructive on [facility] 8/23. Cardiac PET 10/25 showed no evidence for ischemia, low risk study. Continues with chest squeeze occasionally, suspect volume related. - No ASA with need for apixaban .  - Continue statin, check lipids/LFTs with next lab draw. 6. OSA: Moderate OSA on sleep study. He did not tolerate CPAP.  - Continue oxygen  7. Emphysema: Prior smoker.  Emphysema on CT and severe obstruction on PFTs. On 3L home oxygen . 8. Chronic PE: Diagnosed by V/Q scan.  No abnormal bleeding. He has a chronic RLE DVT.   - Continue Eliquis  5 mg  bid, may need to decrease dose if creatinine remains elevated.  - Last SCr 2.15. Will repeat BMET in 10-14 days and adjust AC accordingly. 9. Hiatial hernia w/ esophagitis: Has significant GERD. He follows with Dr. Rollin. - Continue PPI.  10. Nutcracker SMA: Compresses duodenum. - No change.  Follow up in 3 months with Dr. Rolan.  Harlene HERO [facility] FNP-BC 07/06/24

## 2024-06-30 NOTE — Progress Notes (Signed)
  Subjective:  Patient ID: Joshua [name], male    DOB: [birth year],  MRN: [id number]  Joshua [name] presents to clinic today for preventative diabetic foot care and preulcerative lesion(s) right foot and painful mycotic toenails that limit ambulation. Painful toenails interfere with ambulation. Aggravating factors include wearing enclosed shoe gear. Pain is relieved with periodic professional debridement. Painful preulcerative lesion(s) is/are aggravated when weightbearing with and without shoegear. Pain is relieved with periodic professional debridement.  Chief Complaint  Patient presents with   RFC     RFC Non diabetic toenail trim. LOV with PCP 05/14/24.   New problem(s): None.   PCP is Gerome Brunet, DO.  Allergies  Allergen Reactions   Other Swelling    Beer - swelling    Sunflower Oil Swelling   Sulfa Antibiotics Rash    Review of Systems: Negative except as noted in the HPI.  Objective:  There were no vitals filed for this visit. Joshua [name] is a pleasant 88 y.o. male WD, WN in NAD. AAO x 3.On supplemental oxygen .  Vascular Examination: Capillary refill time immediate b/l. Vascular status intact b/l with palpable pedal pulses. Pedal hair present b/l. No pain with calf compression b/l. Skin temperature gradient WNL b/l. No cyanosis or clubbing b/l. No ischemia or gangrene noted b/l.   Neurological Examination: Sensation grossly intact b/l with 10 gram monofilament. Vibratory sensation intact b/l.   Dermatological Examination: Pedal skin with normal turgor, texture and tone b/l.  No open wounds. No interdigital macerations.   Toenails 1-5 b/l thick, discolored, elongated with subungual debris and pain on dorsal palpation.   Preulcerative lesion noted submet head 5 right  foot. There is visible subdermal hemorrhage. There is no surrounding erythema, no edema, no drainage, no odor, no fluctuance.  Musculoskeletal Examination: Muscle strength 5/5 to all lower  extremity muscle groups bilaterally. Hammertoe(s) bilateral 5th toes.  Radiographs: None  Assessment/Plan: 1. Pain due to onychomycosis of nail   2. Pre-ulcerative calluses   3. Coagulation disorder   4. Type 2 diabetes mellitus with other specified complication, unspecified whether long term insulin  use (HCC)    Patient was evaluated and treated. All patient's and/or POA's questions/concerns addressed on today's visit. Mycotic toenails 1-5 b/l debrided in length and girth without incident. Preulcerative lesion(s) submet head 5 right foot pared with sharp debridement without incident. Continue daily foot inspections and monitor blood glucose per PCP/Endocrinologist's recommendations. Continue soft, supportive shoe gear daily. Report any pedal injuries to medical professional. Call office if there are any questions/concerns. -Patient/POA to call should there be question/concern in the interim.   Return in about 3 months (around 09/30/2024).  Joshua [name], DPM      [place] LOCATION: [address], [place] [address]                   Office [phone]   [facility] LOCATION: [address] [place], [place] [address] Office [phone]

## 2024-07-05 ENCOUNTER — Telehealth (HOSPITAL_COMMUNITY): Payer: Self-pay

## 2024-07-05 NOTE — Telephone Encounter (Signed)
 Called to confirm/remind patient of their appointment at the [facility] on 07/06/24.   Appointment:   [x] Confirmed  [] Left mess   [] No answer/No voice mail  [] VM Full/unable to leave message  [] Phone not in service  Patient reminded to bring all medications and/or complete list.  Confirmed patient has transportation. Gave directions, instructed to utilize valet parking.

## 2024-07-06 ENCOUNTER — Encounter (HOSPITAL_COMMUNITY): Payer: Self-pay

## 2024-07-06 ENCOUNTER — Ambulatory Visit (HOSPITAL_COMMUNITY)
Admission: RE | Admit: 2024-07-06 | Discharge: 2024-07-06 | Disposition: A | Discharge status: Home / Self Care | Source: Ambulatory Visit | Attending: Family Medicine | Admitting: Family Medicine

## 2024-07-06 VITALS — BP 118/66 | HR 76 | Ht 75.0 in | Wt 192.0 lb

## 2024-07-06 DIAGNOSIS — I82591 Chronic embolism and thrombosis of other specified deep vein of right lower extremity: Secondary | ICD-10-CM | POA: Diagnosis not present

## 2024-07-06 DIAGNOSIS — J9610 Chronic respiratory failure, unspecified whether with hypoxia or hypercapnia: Secondary | ICD-10-CM | POA: Diagnosis not present

## 2024-07-06 DIAGNOSIS — Z7984 Long term (current) use of oral hypoglycemic drugs: Secondary | ICD-10-CM | POA: Insufficient documentation

## 2024-07-06 DIAGNOSIS — Z7901 Long term (current) use of anticoagulants: Secondary | ICD-10-CM | POA: Diagnosis not present

## 2024-07-06 DIAGNOSIS — I2782 Chronic pulmonary embolism: Secondary | ICD-10-CM | POA: Insufficient documentation

## 2024-07-06 DIAGNOSIS — K2100 Gastro-esophageal reflux disease with esophagitis, without bleeding: Secondary | ICD-10-CM | POA: Diagnosis not present

## 2024-07-06 DIAGNOSIS — I50812 Chronic right heart failure: Secondary | ICD-10-CM | POA: Insufficient documentation

## 2024-07-06 DIAGNOSIS — Z9981 Dependence on supplemental oxygen: Secondary | ICD-10-CM | POA: Insufficient documentation

## 2024-07-06 DIAGNOSIS — Z79899 Other long term (current) drug therapy: Secondary | ICD-10-CM | POA: Insufficient documentation

## 2024-07-06 DIAGNOSIS — K315 Obstruction of duodenum: Secondary | ICD-10-CM | POA: Diagnosis not present

## 2024-07-06 DIAGNOSIS — G4733 Obstructive sleep apnea (adult) (pediatric): Secondary | ICD-10-CM | POA: Diagnosis not present

## 2024-07-06 DIAGNOSIS — J9611 Chronic respiratory failure with hypoxia: Secondary | ICD-10-CM

## 2024-07-06 DIAGNOSIS — K449 Diaphragmatic hernia without obstruction or gangrene: Secondary | ICD-10-CM | POA: Insufficient documentation

## 2024-07-06 DIAGNOSIS — J432 Centrilobular emphysema: Secondary | ICD-10-CM

## 2024-07-06 DIAGNOSIS — I2510 Atherosclerotic heart disease of native coronary artery without angina pectoris: Secondary | ICD-10-CM | POA: Diagnosis not present

## 2024-07-06 DIAGNOSIS — J439 Emphysema, unspecified: Secondary | ICD-10-CM | POA: Diagnosis not present

## 2024-07-06 DIAGNOSIS — N183 Chronic kidney disease, stage 3 unspecified: Secondary | ICD-10-CM | POA: Insufficient documentation

## 2024-07-06 DIAGNOSIS — K56600 Partial intestinal obstruction, unspecified as to cause: Secondary | ICD-10-CM | POA: Diagnosis not present

## 2024-07-06 DIAGNOSIS — Z87891 Personal history of nicotine dependence: Secondary | ICD-10-CM | POA: Diagnosis not present

## 2024-07-06 DIAGNOSIS — I272 Pulmonary hypertension, unspecified: Secondary | ICD-10-CM | POA: Insufficient documentation

## 2024-07-06 DIAGNOSIS — I5032 Chronic diastolic (congestive) heart failure: Secondary | ICD-10-CM | POA: Insufficient documentation

## 2024-07-06 DIAGNOSIS — Z86711 Personal history of pulmonary embolism: Secondary | ICD-10-CM

## 2024-07-06 MED ORDER — TORSEMIDE 20 MG PO TABS: 20.0000 mg | ORAL_TABLET | Freq: Every day | ORAL | 3 refills | Status: AC

## 2024-07-06 MED ORDER — POTASSIUM CHLORIDE CRYS ER 10 MEQ PO TBCR
10.0000 meq | EXTENDED_RELEASE_TABLET | Freq: Every day | ORAL | 3 refills | Status: AC
Start: 2024-07-06 — End: ?

## 2024-07-06 NOTE — Patient Instructions (Signed)
 Medication Changes:  RESTART POTASSIUM 10MEQ ONCE DAILY   TAKE TORSEMIDE  20MG  ONCE DAILY   Lab Work:  RETURN FOR LABS AS SCHEDULED IN 10-14 DAYS AS SCHEDULED  Follow-Up in: 3 MONTHS WITH DR. ROLAN PLEASE CALL OUR OFFICE AROUND JANUARY TO GET SCHEDULED FOR YOUR APPOINTMENT. PHONE NUMBER IS [phone] OPTION 2   At the [facility], you and your health needs are our priority. We have a designated team specialized in the treatment of Heart Failure. This Care Team includes your primary Heart Failure Specialized Cardiologist (physician), Advanced Practice Providers (APPs- Physician Assistants and Nurse Practitioners), and Pharmacist who all work together to provide you with the care you need, when you need it.   You may see any of the following providers on your designated Care Team at your next follow up:  Dr. Toribio Fuel Dr. Ezra Rolan Dr. Odis Brownie Greig Mosses, NP Caffie Shed, [place] [facility] [place], [place] Beckey Coe, NP Jordan Lee, NP Tinnie Redman, PharmD   Please be sure to bring in all your medications bottles to every appointment.   Need to Contact Us :  If you have any questions or concerns before your next appointment please send us  a message through [place] or call our office at [phone].    TO LEAVE A MESSAGE FOR THE NURSE SELECT OPTION 2, PLEASE LEAVE A MESSAGE INCLUDING: YOUR NAME DATE OF BIRTH CALL BACK NUMBER REASON FOR CALL**this is important as we prioritize the call backs  YOU WILL RECEIVE A CALL BACK THE SAME DAY AS LONG AS YOU CALL BEFORE 4:00 PM

## 2024-07-10 ENCOUNTER — Other Ambulatory Visit: Payer: Self-pay | Admitting: Cardiology

## 2024-07-19 ENCOUNTER — Ambulatory Visit (HOSPITAL_COMMUNITY)
Admission: RE | Admit: 2024-07-19 | Discharge: 2024-07-19 | Disposition: A | Source: Ambulatory Visit | Attending: Cardiology

## 2024-07-19 ENCOUNTER — Ambulatory Visit (HOSPITAL_COMMUNITY): Payer: Self-pay | Admitting: Cardiology

## 2024-07-19 DIAGNOSIS — I5032 Chronic diastolic (congestive) heart failure: Secondary | ICD-10-CM | POA: Diagnosis not present

## 2024-07-19 LAB — BASIC METABOLIC PANEL WITH GFR
Anion gap: 10 (ref 5–15)
BUN: 47 mg/dL — ABNORMAL HIGH (ref 8–23)
CO2: 34 mmol/L — ABNORMAL HIGH (ref 22–32)
Calcium: 9.8 mg/dL (ref 8.9–10.3)
Chloride: 99 mmol/L (ref 98–111)
Creatinine, Ser: 2.32 mg/dL — ABNORMAL HIGH (ref 0.61–1.24)
GFR, Estimated: 27 mL/min — ABNORMAL LOW (ref >60)
Glucose, Bld: 130 mg/dL — ABNORMAL HIGH (ref 70–99)
Potassium: 4.8 mmol/L (ref 3.5–5.1)
Sodium: 143 mmol/L (ref 135–145)

## 2024-07-22 ENCOUNTER — Ambulatory Visit (INDEPENDENT_AMBULATORY_CARE_PROVIDER_SITE_OTHER): Admitting: Pulmonary Disease

## 2024-07-22 ENCOUNTER — Encounter (HOSPITAL_BASED_OUTPATIENT_CLINIC_OR_DEPARTMENT_OTHER): Payer: Self-pay | Admitting: Pulmonary Disease

## 2024-07-22 VITALS — BP 85/50 | HR 64 | Ht 75.0 in | Wt 186.4 lb

## 2024-07-22 DIAGNOSIS — J4489 Other specified chronic obstructive pulmonary disease: Secondary | ICD-10-CM

## 2024-07-22 DIAGNOSIS — J439 Emphysema, unspecified: Secondary | ICD-10-CM

## 2024-07-22 DIAGNOSIS — J9611 Chronic respiratory failure with hypoxia: Secondary | ICD-10-CM

## 2024-07-22 NOTE — Progress Notes (Signed)
 Subjective:    Patient ID: Joshua [name], male    DOB: February 10, 1936, 88 y.o.   MRN: [id number]   88 yo former smoker for FU of COPD, chronic PE & severe pulmonary hypertension ,being treated as CTEPH / WHO 3/4 -Recurrent aspiration pneumonia   PMH : PE postop after a knee replacement in 2016. -He lost more than 50 pounds,seen by GI, duodenal narrowing without obvious stricture -Hemoptysis 09/2022 with RLL Pneumonia HFpEF   -duodenal obstruction  -EGD 06/2021 showed Candida esophagitis, hiatal hernia  - Nutcracker phenomenon: abrupt narrowing third portion of duodenum as it passes between abdominal aorta and SMV.   Meds-  tried on Breo, anoro & trelegy  none of these gave any symptomatic relief -prefers Spiriva , stiolto Breztri  no better than stiolto Started on rociguat 01/2021    He was adm 01/2023 in [place] for pneumonia, readmitted 7/25 for 3 days for right lower lobe pneumonia, resolved on CT chest 04/2023    Discussed the use of AI scribe software for clinical note transcription with the patient, who gave verbal consent to proceed.  History of Present Illness Joshua [name] is an 88 year old male with COPD, heart failure with preserved ejection fraction, and severe pulmonary hypertension who presents for follow-up.  He has exertional dyspnea with significant oxygen  desaturation. He uses a portable oxygen  concentrator at 2 L/min. His saturation on arrival was 76%, though he reports home saturations typically above 90% on his continuous oxygen  machine.  He has neck pain from the weight of the portable oxygen  device when walking, which affects comfort when using oxygen  while ambulating. He uses the portable device for activity and a stationary machine when upstairs.  He has intermittent chest pain at rest and with exertion. Prior cardiac workup including MRI was reported as normal. He takes Riociguat  (Adempas ) for pulmonary hypertension and an anticoagulant for prior blood  clots.  His current medications include Torsemide , recently increased from 10 mg to 20 mg, Midodrine  for low blood pressure, and Stiolto for COPD. He has had no recent respiratory infections, hospitalizations, weight gain, or fluid overload this year.   Reviewed CHF note from 11/18,PET CT was low risk study although he has severe coronary calcification  Significant tests/ events reviewed   6 minute walk (10/22): 274 m (oxygen  saturation dropped to 70s on 2L [place])   02/2021 Ambulatory saturation was 93% at rest and desaturated to 87% on walking, recovered with 3 L of oxygen  and maintain while ambulating   Echo (2/25): EF 60-65%, D shaped septum suggesting cor pulmonale, RV severely reduced     RHC (4/22) with mean RA 5, PA 65/19 mean 36, mean PCWP 5, CI 2.19, PVR 6.1 WU, PAPi 9.2.  NPSG 02/2018 >> no OSA, 45 mins desatn, TST 5.5 h   09/15/14 CT chest showed a large bilateral pulmonary embolism with RV dilatation. 08/2014 Venous Doppler showed no DVT    01/29/21- FEV1 51%, no BD response, ratio 61, TLC 77%, DLCO 35%  PFTs  11/2014 - FEV1 59%, no BD response, ratio 70, TLC 68%, DLCO 35%  Review of Systems  neg for any significant sore throat, dysphagia, itching, sneezing, nasal congestion or excess/ purulent secretions, fever, chills, sweats, unintended wt loss, pleuritic or exertional cp, hempoptysis, orthopnea pnd or change in chronic leg swelling. Also denies presyncope, palpitations, heartburn, abdominal pain, nausea, vomiting, diarrhea or change in bowel or urinary habits, dysuria,hematuria, rash, arthralgias, visual complaints, headache, numbness weakness or ataxia.  Objective:   Physical Exam  Gen. Pleasant, well-nourished, in no distress ENT - no thrush, no pallor/icterus,no post nasal drip Neck: No JVD, no thyromegaly, no carotid bruits Lungs: no use of accessory muscles, no dullness to percussion, clear without rales or rhonchi  Cardiovascular: Rhythm regular, heart sounds   normal, no murmurs or gallops, 1+ peripheral edema Musculoskeletal: No deformities, no cyanosis or clubbing        Assessment & Plan:   Assessment and Plan Assessment & Plan Chronic obstructive pulmonary disease COPD. No recent exacerbations or hospitalizations. Current inhaler regimen includes Stiolto. Previous trial of Breztri  was ineffective due to cost and lack of significant benefit. - Continue Stiolto inhaler regimen.  Chronic respiratory failure with hypoxia Chronic respiratory failure with hypoxia, requiring supplemental oxygen . Oxygen  saturation was 76% on 2 L POC, improved to 92% on 4 L POC. No fluid overload or other obvious cause of worsening identified. Oxygen  requirement may be higher with portable oxygen  concentrator compared to home continuous oxygen . - Maintain oxygen  at 4 L POC for the next 2-3 days to achieve saturation of 90%. - Adjust oxygen  flow based on daily saturation levels, aiming for 90%. - Avoid driving when oxygen  saturation is below 90%.  Severe pulmonary hypertension (group 3 and 4) Severe pulmonary hypertension managed with Riociguat  and blood thinners. Current management appears effective in controlling symptoms. - Continue Riociguat  and blood thinners as prescribed. Limited by hypotension, on midodrine , cannot add PDE-I

## 2024-07-22 NOTE — Patient Instructions (Signed)
  VISIT SUMMARY: You came in today for a follow-up visit to discuss your COPD, heart failure, and severe pulmonary hypertension. We reviewed your symptoms, including your exertional shortness of breath, oxygen  levels, neck pain from your portable oxygen  device, and intermittent chest pain. We also discussed your current medications and their effectiveness.  YOUR PLAN: -CHRONIC OBSTRUCTIVE PULMONARY DISEASE (COPD): COPD is a chronic lung disease that makes it hard to breathe. You have not had any recent flare-ups or hospital visits for this condition. Continue using your Stiolto inhaler as it is helping to manage your symptoms.  -CHRONIC RESPIRATORY FAILURE WITH HYPOXIA: Chronic respiratory failure with hypoxia means your lungs are not getting enough oxygen  into your blood. Your oxygen  levels were low on arrival but improved with a higher oxygen  flow. Use your portable oxygen  concentrator at 4 liters per minute for the next 2-3 days to keep your oxygen  levels at 90%. Adjust the oxygen  flow based on your daily oxygen  levels and avoid driving if your oxygen  level is below 90%.  -SEVERE PULMONARY HYPERTENSION: Severe pulmonary hypertension is high blood pressure in the lungs' arteries. Your current medications, Riociguat  and blood thinners, are effectively managing your symptoms. Continue taking these medications as prescribed.  INSTRUCTIONS: Maintain your oxygen  at 4 liters per minute for the next 2-3 days and adjust based on your daily oxygen  levels. Avoid driving if your oxygen  saturation is below 90%.                      Contains text generated by [employer].                                 Contains text generated by [employer].

## 2024-07-26 ENCOUNTER — Ambulatory Visit (HOSPITAL_COMMUNITY): Payer: Self-pay | Admitting: Cardiology

## 2024-07-26 ENCOUNTER — Ambulatory Visit (HOSPITAL_COMMUNITY): Admission: RE | Admit: 2024-07-26 | Discharge: 2024-07-26 | Attending: Internal Medicine

## 2024-07-26 DIAGNOSIS — I5032 Chronic diastolic (congestive) heart failure: Secondary | ICD-10-CM | POA: Diagnosis not present

## 2024-07-26 LAB — BASIC METABOLIC PANEL WITH GFR
Anion gap: 10 (ref 5–15)
BUN: 41 mg/dL — ABNORMAL HIGH (ref 8–23)
CO2: 32 mmol/L (ref 22–32)
Calcium: 9.8 mg/dL (ref 8.9–10.3)
Chloride: 98 mmol/L (ref 98–111)
Creatinine, Ser: 2.36 mg/dL — ABNORMAL HIGH (ref 0.61–1.24)
GFR, Estimated: 26 mL/min — ABNORMAL LOW (ref >60)
Glucose, Bld: 106 mg/dL — ABNORMAL HIGH (ref 70–99)
Potassium: 5.1 mmol/L (ref 3.5–5.1)
Sodium: 140 mmol/L (ref 135–145)

## 2024-08-01 ENCOUNTER — Other Ambulatory Visit (HOSPITAL_COMMUNITY): Payer: Self-pay | Admitting: Cardiology

## 2024-08-26 ENCOUNTER — Other Ambulatory Visit (HOSPITAL_COMMUNITY): Payer: Self-pay | Admitting: Pharmacist

## 2024-08-26 MED ORDER — ADEMPAS 2 MG PO TABS: 2.0000 mg | ORAL_TABLET | Freq: Three times a day (TID) | ORAL | 11 refills | Status: AC

## 2024-09-28 ENCOUNTER — Ambulatory Visit (HOSPITAL_COMMUNITY)

## 2024-10-19 ENCOUNTER — Ambulatory Visit: Admitting: Podiatry
